# Patient Record
Sex: Male | Born: 1955
Health system: Southern US, Community
[De-identification: ages and names within clinical notes are randomized; demographics above are authoritative.]

## PROBLEM LIST (undated history)

## (undated) DIAGNOSIS — E785 Hyperlipidemia, unspecified: Secondary | ICD-10-CM

## (undated) DIAGNOSIS — C801 Malignant (primary) neoplasm, unspecified: Secondary | ICD-10-CM

## (undated) DIAGNOSIS — H3320 Serous retinal detachment, unspecified eye: Secondary | ICD-10-CM

## (undated) DIAGNOSIS — I10 Essential (primary) hypertension: Secondary | ICD-10-CM

## (undated) DIAGNOSIS — K219 Gastro-esophageal reflux disease without esophagitis: Secondary | ICD-10-CM

## (undated) DIAGNOSIS — Z860100 Personal history of colon polyps, unspecified: Secondary | ICD-10-CM

## (undated) DIAGNOSIS — R55 Syncope and collapse: Secondary | ICD-10-CM

## (undated) DIAGNOSIS — H33311 Horseshoe tear of retina without detachment, right eye: Secondary | ICD-10-CM

## (undated) DIAGNOSIS — H269 Unspecified cataract: Secondary | ICD-10-CM

## (undated) DIAGNOSIS — S82899A Other fracture of unspecified lower leg, initial encounter for closed fracture: Secondary | ICD-10-CM

## (undated) DIAGNOSIS — N200 Calculus of kidney: Secondary | ICD-10-CM

## (undated) DIAGNOSIS — S82009A Unspecified fracture of unspecified patella, initial encounter for closed fracture: Secondary | ICD-10-CM

## (undated) HISTORY — PX: EYE SURGERY: SHX253

## (undated) HISTORY — DX: Other fracture of unspecified lower leg, initial encounter for closed fracture: S82.899A

## (undated) HISTORY — PX: ORCHIECTOMY: SHX2116

## (undated) HISTORY — PX: CATARACT EXTRACTION: SUR2

## (undated) HISTORY — DX: Serous retinal detachment, unspecified eye: H33.20

## (undated) HISTORY — DX: Malignant (primary) neoplasm, unspecified: C80.1

## (undated) HISTORY — DX: Essential (primary) hypertension: I10

## (undated) HISTORY — PX: HYDROCELE EXCISION: SHX482

## (undated) HISTORY — PX: TONSILLECTOMY: SUR1361

## (undated) HISTORY — DX: Syncope and collapse: R55

## (undated) HISTORY — PX: KNEE ARTHROSCOPY: SUR90

## (undated) HISTORY — DX: Calculus of kidney: N20.0

## (undated) HISTORY — DX: Personal history of colonic polyps: Z86.010

## (undated) HISTORY — DX: Unspecified fracture of unspecified patella, initial encounter for closed fracture: S82.009A

## (undated) HISTORY — PX: WISDOM TOOTH EXTRACTION: SHX21

## (undated) HISTORY — DX: Hyperlipidemia, unspecified: E78.5

## (undated) HISTORY — DX: Unspecified cataract: H26.9

## (undated) HISTORY — DX: Personal history of colon polyps, unspecified: Z86.0100

---
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
## 2005-11-07 ENCOUNTER — Encounter: Admission: RE | Admit: 2005-11-07 | Discharge: 2005-11-07 | Payer: Self-pay | Admitting: Family Medicine

## 2005-11-07 ENCOUNTER — Ambulatory Visit: Payer: Self-pay | Admitting: Family Medicine

## 2005-11-08 ENCOUNTER — Ambulatory Visit: Payer: Self-pay | Admitting: Family Medicine

## 2006-04-10 DIAGNOSIS — R55 Syncope and collapse: Secondary | ICD-10-CM

## 2006-04-10 HISTORY — DX: Syncope and collapse: R55

## 2006-07-03 ENCOUNTER — Ambulatory Visit: Payer: Self-pay | Admitting: Family Medicine

## 2007-09-26 ENCOUNTER — Encounter: Payer: Self-pay | Admitting: Family Medicine

## 2007-10-01 ENCOUNTER — Ambulatory Visit: Payer: Self-pay | Admitting: Family Medicine

## 2007-10-01 DIAGNOSIS — I10 Essential (primary) hypertension: Secondary | ICD-10-CM

## 2007-10-01 DIAGNOSIS — Z8547 Personal history of malignant neoplasm of testis: Secondary | ICD-10-CM

## 2007-10-01 DIAGNOSIS — Z860100 Personal history of colon polyps, unspecified: Secondary | ICD-10-CM | POA: Insufficient documentation

## 2007-10-01 DIAGNOSIS — E785 Hyperlipidemia, unspecified: Secondary | ICD-10-CM | POA: Insufficient documentation

## 2007-10-01 DIAGNOSIS — Z87442 Personal history of urinary calculi: Secondary | ICD-10-CM

## 2007-10-09 ENCOUNTER — Encounter: Payer: Self-pay | Admitting: Family Medicine

## 2007-11-06 ENCOUNTER — Ambulatory Visit: Payer: Self-pay | Admitting: Family Medicine

## 2007-11-06 LAB — CONVERTED CEMR LAB
Ketones, urine, test strip: NEGATIVE
Specific Gravity, Urine: >=1.03
Urobilinogen, UA: 0.2
WBC Urine, dipstick: NEGATIVE

## 2007-11-07 LAB — CONVERTED CEMR LAB
Albumin: 4.4 g/dL (ref 3.5–5.2)
Alkaline Phosphatase: 60 units/L (ref 39–117)
BUN: 24 mg/dL — ABNORMAL HIGH (ref 6–23)
Basophils Absolute: 0 10*3/uL (ref 0.0–0.1)
Bilirubin, Direct: 0.1 mg/dL (ref 0.0–0.3)
Calcium: 9.5 mg/dL (ref 8.4–10.5)
Chloride: 103 meq/L (ref 96–112)
Cholesterol: 180 mg/dL (ref 0–200)
Direct LDL: 65.9 mg/dL
GFR calc non Af Amer: 74 mL/min
HDL: 28.8 mg/dL — ABNORMAL LOW (ref >39.0)
MCHC: 35.2 g/dL (ref 30.0–36.0)
Monocytes Absolute: 0.4 10*3/uL (ref 0.1–1.0)
Monocytes Relative: 8.4 % (ref 3.0–12.0)
Neutro Abs: 3.5 10*3/uL (ref 1.4–7.7)
PSA: 0.65 ng/mL (ref 0.10–4.00)
Platelets: 167 10*3/uL (ref 150–400)
RBC: 5.57 M/uL (ref 4.22–5.81)
Sodium: 144 meq/L (ref 135–145)
Total Bilirubin: 1.2 mg/dL (ref 0.3–1.2)
Total CHOL/HDL Ratio: 6.3
WBC: 5.1 10*3/uL (ref 4.5–10.5)

## 2007-11-20 ENCOUNTER — Ambulatory Visit: Payer: Self-pay | Admitting: Family Medicine

## 2007-12-02 ENCOUNTER — Telehealth: Payer: Self-pay | Admitting: Family Medicine

## 2007-12-03 ENCOUNTER — Ambulatory Visit: Payer: Self-pay | Admitting: Cardiology

## 2007-12-03 ENCOUNTER — Ambulatory Visit: Payer: Self-pay | Admitting: Family Medicine

## 2007-12-03 DIAGNOSIS — R319 Hematuria, unspecified: Secondary | ICD-10-CM

## 2007-12-03 LAB — CONVERTED CEMR LAB
Bilirubin Urine: NEGATIVE
Nitrite: NEGATIVE
Urobilinogen, UA: 0.2
pH: 5

## 2007-12-04 ENCOUNTER — Encounter: Payer: Self-pay | Admitting: Family Medicine

## 2008-06-01 ENCOUNTER — Ambulatory Visit: Payer: Self-pay | Admitting: Family Medicine

## 2008-06-01 DIAGNOSIS — L919 Hypertrophic disorder of the skin, unspecified: Secondary | ICD-10-CM

## 2008-06-01 DIAGNOSIS — IMO0002 Reserved for concepts with insufficient information to code with codable children: Secondary | ICD-10-CM | POA: Insufficient documentation

## 2008-06-01 DIAGNOSIS — L909 Atrophic disorder of skin, unspecified: Secondary | ICD-10-CM | POA: Insufficient documentation

## 2009-09-07 ENCOUNTER — Telehealth: Payer: Self-pay | Admitting: Family Medicine

## 2009-09-15 ENCOUNTER — Ambulatory Visit: Payer: Self-pay | Admitting: Family Medicine

## 2009-09-22 ENCOUNTER — Ambulatory Visit: Payer: Self-pay | Admitting: Family Medicine

## 2009-09-23 LAB — CONVERTED CEMR LAB
ALT: 21 units/L (ref 0–53)
AST: 21 units/L (ref 0–37)
Alkaline Phosphatase: 74 units/L (ref 39–117)
BUN: 21 mg/dL (ref 6–23)
Basophils Absolute: 0 10*3/uL (ref 0.0–0.1)
Bilirubin, Direct: 0.2 mg/dL (ref 0.0–0.3)
Calcium: 9.3 mg/dL (ref 8.4–10.5)
Creatinine, Ser: 0.9 mg/dL (ref 0.4–1.5)
Eosinophils Absolute: 0.1 10*3/uL (ref 0.0–0.7)
Eosinophils Relative: 1.9 % (ref 0.0–5.0)
GFR calc non Af Amer: 88.51 mL/min (ref >60)
Glucose, Bld: 86 mg/dL (ref 70–99)
HCT: 48.7 % (ref 39.0–52.0)
Lymphocytes Relative: 21.3 % (ref 12.0–46.0)
MCV: 84.7 fL (ref 78.0–100.0)
Neutro Abs: 4 10*3/uL (ref 1.4–7.7)
Platelets: 172 10*3/uL (ref 150.0–400.0)
Potassium: 4 meq/L (ref 3.5–5.1)
Total Bilirubin: 1 mg/dL (ref 0.3–1.2)
Triglycerides: 416 mg/dL — ABNORMAL HIGH (ref 0.0–149.0)
WBC: 5.8 10*3/uL (ref 4.5–10.5)

## 2009-09-28 LAB — CONVERTED CEMR LAB
Bilirubin Urine: NEGATIVE
Glucose, Urine, Semiquant: NEGATIVE
Specific Gravity, Urine: >=1.03
pH: 5

## 2009-09-30 ENCOUNTER — Ambulatory Visit: Payer: Self-pay | Admitting: Family Medicine

## 2009-09-30 LAB — CONVERTED CEMR LAB
Specific Gravity, Urine: 1.02
WBC Urine, dipstick: NEGATIVE

## 2009-11-26 ENCOUNTER — Encounter (INDEPENDENT_AMBULATORY_CARE_PROVIDER_SITE_OTHER): Payer: Self-pay | Admitting: *Deleted

## 2010-05-10 NOTE — Assessment & Plan Note (Signed)
Summary: CPX // RS   Vital Signs:  Patient profile:   55 year old male Weight:      253 pounds BMI:     35.41 BP sitting:   170 / 108  (left arm) Cuff size:   large  Vitals Entered By: Raechel Ache, RN (September 22, 2009 1:45 PM) CC: CPX, labs done. C/o bulge in abd, LBP in morning. C/o chest pains at noc, seems positional.   History of Present Illness: 55 yr old male for a cpx. He feels fine but realizes he has gained a lot of weight in the past year. After injuring his right leg last fall playing tennis, he was unable to exercise much at all for a time.   Allergies (verified): No Known Drug Allergies  Past History:  Past Medical History: Colonic polyps, hx of Hyperlipidemia Hypertension Nephrolithiasis, hx of Hx of testicular cancer 1974, had surgery and radiation treatments (no chemo) syncopal episode 11-2006, hospitalized in [place], [place], probably vasovagal in origin fell playing tennis 02-2009, had fractures to right ankle and right patella, also torn cartilage right knee  Past Surgical History: right knee arthroscopy  1995 Hydrocele  1992 right orchiectomy 1974 colonoscopy 2004, clear but he had had some polyps in the past  repeat right knee arthroscopy 02-2009 to repair torn cartilage, also to drill holes in the patella for a fracture  Family History: Reviewed history from 10/01/2007 and no changes required. Family History of CAD Male 1st degree relative <60 Family History Hypertension Family History of Sudden Death  Social History: Reviewed history from 10/01/2007 and no changes required. Married Never Smoked Alcohol use-yes Drug use-no Regular exercise-yes Occupation: runs a Engineer, drilling  Review of Systems  The patient denies anorexia, fever, weight loss, weight gain, vision loss, decreased hearing, hoarseness, chest pain, syncope, dyspnea on exertion, peripheral edema, prolonged cough, headaches, hemoptysis, abdominal pain, melena, hematochezia,  severe indigestion/heartburn, hematuria, incontinence, genital sores, muscle weakness, suspicious skin lesions, transient blindness, difficulty walking, depression, unusual weight change, abnormal bleeding, enlarged lymph nodes, angioedema, breast masses, and testicular masses.    Physical Exam  General:  overweight-appearing.   Head:  Normocephalic and atraumatic without obvious abnormalities. No apparent alopecia or balding. Eyes:  No corneal or conjunctival inflammation noted. EOMI. Perrla. Funduscopic exam benign, without hemorrhages, exudates or papilledema. Vision grossly normal. Ears:  External ear exam shows no significant lesions or deformities.  Otoscopic examination reveals clear canals, tympanic membranes are intact bilaterally without bulging, retraction, inflammation or discharge. Hearing is grossly normal bilaterally. Nose:  External nasal examination shows no deformity or inflammation. Nasal mucosa are pink and moist without lesions or exudates. Mouth:  Oral mucosa and oropharynx without lesions or exudates.  Teeth in good repair. Neck:  No deformities, masses, or tenderness noted. Chest Wall:  No deformities, masses, tenderness or gynecomastia noted. Lungs:  Normal respiratory effort, chest expands symmetrically. Lungs are clear to auscultation, no crackles or wheezes. Heart:  Normal rate and regular rhythm. S1 and S2 normal without gallop, murmur, click, rub or other extra sounds. EKG normal. Abdomen:  Bowel sounds positive,abdomen soft and non-tender without masses, organomegaly or hernias noted. He has a nontender diastasis recti  Rectal:  No external abnormalities noted. Normal sphincter tone. No rectal masses or tenderness. Heme neg.  Genitalia:  normal except right testicle is absent Prostate:  Prostate gland firm and smooth, no enlargement, nodularity, tenderness, mass, asymmetry or induration. Msk:  No deformity or scoliosis noted of thoracic or lumbar spine.  Pulses:  R  and L carotid,radial,femoral,dorsalis pedis and posterior tibial pulses are full and equal bilaterally Extremities:  No clubbing, cyanosis, edema, or deformity noted with normal full range of motion of all joints.   Neurologic:  No cranial nerve deficits noted. Station and gait are normal. Plantar reflexes are down-going bilaterally. DTRs are symmetrical throughout. Sensory, motor and coordinative functions appear intact. Skin:  Intact without suspicious lesions or rashes Cervical Nodes:  No lymphadenopathy noted Axillary Nodes:  No palpable lymphadenopathy Inguinal Nodes:  No significant adenopathy Psych:  Cognition and judgment appear intact. Alert and cooperative with normal attention span and concentration. No apparent delusions, illusions, hallucinations   Impression & Recommendations:  Problem # 1:  WELL ADULT EXAM (ICD-V70.0)  Orders: Hemoccult Guaiac-1 spec.(in office) (82270) EKG w/ Interpretation (93000)  Complete Medication List: 1)  Lisinopril-hydrochlorothiazide 20-12.5 Mg Tabs (Lisinopril-hydrochlorothiazide) .Marland Kitchen.. 1 tablet by mouth daily 2)  Fenofibrate 160 Mg Tabs (Fenofibrate) .... Once daily 3)  Fish Oil 1200 Mg Caps (Omega-3 fatty acids) .... Once daily  Other Orders: Gastroenterology Referral (GI)  Patient Instructions: 1)  It is important that you exercise reguarly at least 20 minutes 5 times a week. If you develop chest pain, have severe difficulty breathing, or feel very tired, stop exercising immediately and seek medical attention.  2)  You need to lose weight. Consider a lower calorie diet and regular exercise.  3)  Get back on meds for HTN. 4)  Start on Fenofibrate for high TG and add fish oil.  5)  Please schedule a follow-up appointment in 3 months .  6)  Schedule a colonoscopy/ sigmoidoscopy to help detect colon cancer.  Prescriptions: FENOFIBRATE 160 MG TABS (FENOFIBRATE) once daily  #30 x 11   Entered and Authorized by:   [name] MD   Signed by:    [name] MD on 09/22/2009   Method used:   Electronically to        [facility] # [phone]* (retail)       [address]       [place], [place]  [address]       Ph: [phone]       Fax: [phone]   RxID:   [id number] LISINOPRIL-HYDROCHLOROTHIAZIDE 20-12.5 MG TABS (LISINOPRIL-HYDROCHLOROTHIAZIDE) 1 tablet by mouth daily  #30 x 11   Entered and Authorized by:   [name] MD   Signed by:   [name] MD on 09/22/2009   Method used:   Electronically to        [facility] # [id number]* (retail)       [address]       [place], [place]  [address]       Ph: [phone]       Fax: [phone]   RxID:   [id number]

## 2010-05-10 NOTE — Letter (Signed)
Summary: Referral - not able to see patient  [facility]  [address] [place], [place] [address]   Phone: [phone]  Fax: [phone]    November 26, 2009    Tera Mater. Clent Ridges, M.D. [address] [place], [place] [address]   Re:   [name] [name] DOB:  1956/03/12 MRN:   [id number]    Dear Dr. Clent Ridges:  Thank you for your kind referral of the above patient.  We have attempted to schedule the recommended procedure Screening Colonoscopy but have not been able to schedule because:   X  The patient was not available by phone and/or has not returned our calls.  ___ The patient declined to schedule the procedure at this time.  We appreciate the referral and hope that we will have the opportunity to treat this patient in the future.    Sincerely,    [facility] [phone]

## 2010-05-10 NOTE — Progress Notes (Signed)
Summary: chest pain  Phone Note Call from Patient   Summary of Call: Mild chest pain at night that comes & goes, goes away if he changes position, no radiation, nausea, perspiration.  Advised 98for worsening sypmtoms until appointment.   Initial call taken by: Rudy Jew, RN,  Sep 07, 2009 2:21 PM

## 2010-10-11 ENCOUNTER — Encounter: Payer: Self-pay | Admitting: Family Medicine

## 2010-10-14 ENCOUNTER — Encounter: Payer: Self-pay | Admitting: Family Medicine

## 2010-10-14 ENCOUNTER — Ambulatory Visit (INDEPENDENT_AMBULATORY_CARE_PROVIDER_SITE_OTHER): Payer: BC Managed Care – PPO | Admitting: Family Medicine

## 2010-10-14 VITALS — BP 130/80 | HR 74 | Temp 98.0°F | Wt 241.0 lb

## 2010-10-14 DIAGNOSIS — I10 Essential (primary) hypertension: Secondary | ICD-10-CM

## 2010-10-14 DIAGNOSIS — L989 Disorder of the skin and subcutaneous tissue, unspecified: Secondary | ICD-10-CM

## 2010-10-14 MED ORDER — FENOFIBRATE 160 MG PO TABS: 160.0000 mg | ORAL_TABLET | Freq: Every day | ORAL | Status: DC

## 2010-10-14 MED ORDER — LISINOPRIL-HYDROCHLOROTHIAZIDE 20-12.5 MG PO TABS: 1.0000 | ORAL_TABLET | Freq: Every day | ORAL | Status: DC

## 2010-10-14 NOTE — Progress Notes (Signed)
  Subjective:    Patient ID: [name] [name], male    DOB: Feb 26, 1956, 55 y.o.   MRN: [id number]  HPI Here to follow up on HTN and to ask about some skin leisons around his eyes. These have been growing for several years. He feels great, still playing tennis. He has started on a no carb diet and is losing weight.    Review of Systems  Constitutional: Negative.   Respiratory: Negative.   Cardiovascular: Negative.        Objective:   Physical Exam  Constitutional: He appears well-developed and well-nourished.  Neck: No thyromegaly present.  Cardiovascular: Normal rate, regular rhythm, normal heart sounds and intact distal pulses.   Pulmonary/Chest: Effort normal and breath sounds normal.  Lymphadenopathy:    He has no cervical adenopathy.  Skin:       Several skin tags on the eyelids           Assessment & Plan:  Refilled meds. Set up a cpx with labs soon. Refer to Dermatology.

## 2010-10-25 ENCOUNTER — Encounter: Payer: Self-pay | Admitting: Speech Pathology

## 2010-11-24 ENCOUNTER — Other Ambulatory Visit (INDEPENDENT_AMBULATORY_CARE_PROVIDER_SITE_OTHER): Payer: BC Managed Care – PPO

## 2010-11-24 ENCOUNTER — Other Ambulatory Visit: Payer: BC Managed Care – PPO | Admitting: Family Medicine

## 2010-11-24 DIAGNOSIS — Z0000 Encounter for general adult medical examination without abnormal findings: Secondary | ICD-10-CM

## 2010-11-24 LAB — LIPID PANEL
Cholesterol: 168 mg/dL (ref 0–200)
VLDL: 14.8 mg/dL (ref 0.0–40.0)

## 2010-11-24 LAB — POCT URINALYSIS DIPSTICK
Glucose, UA: NEGATIVE
Nitrite, UA: NEGATIVE
Spec Grav, UA: 1.025
Urobilinogen, UA: 0.2

## 2010-11-24 LAB — BASIC METABOLIC PANEL
Calcium: 9.1 mg/dL (ref 8.4–10.5)
Chloride: 105 mEq/L (ref 96–112)
Creatinine, Ser: 0.9 mg/dL (ref 0.4–1.5)
Potassium: 4.8 mEq/L (ref 3.5–5.1)
Sodium: 140 mEq/L (ref 135–145)

## 2010-11-24 LAB — CBC WITH DIFFERENTIAL/PLATELET
Basophils Absolute: 0 10*3/uL (ref 0.0–0.1)
Eosinophils Absolute: 0.1 10*3/uL (ref 0.0–0.7)
Eosinophils Relative: 2.4 % (ref 0.0–5.0)
Hemoglobin: 15.5 g/dL (ref 13.0–17.0)
MCHC: 33.8 g/dL (ref 30.0–36.0)
Monocytes Absolute: 0.4 10*3/uL (ref 0.1–1.0)
Neutro Abs: 3.3 10*3/uL (ref 1.4–7.7)
Platelets: 172 10*3/uL (ref 150.0–400.0)
RBC: 5.25 Mil/uL (ref 4.22–5.81)
RDW: 13.4 % (ref 11.5–14.6)
WBC: 5 10*3/uL (ref 4.5–10.5)

## 2010-11-24 LAB — TSH: TSH: 0.83 u[IU]/mL (ref 0.35–5.50)

## 2010-11-24 LAB — HEPATIC FUNCTION PANEL
ALT: 29 U/L (ref 0–53)
AST: 27 U/L (ref 0–37)
Alkaline Phosphatase: 46 U/L (ref 39–117)
Bilirubin, Direct: 0.2 mg/dL (ref 0.0–0.3)

## 2010-11-29 ENCOUNTER — Telehealth: Payer: Self-pay

## 2010-11-29 NOTE — Telephone Encounter (Signed)
Message copied by Beverely Low on Tue Nov 29, 2010  2:00 PM ------      Message from: Gershon Crane A      Created: Tue Nov 29, 2010  5:35 AM       Normal except mildly high chol. Watch the diet

## 2010-11-29 NOTE — Telephone Encounter (Signed)
Pt aware.

## 2010-12-01 ENCOUNTER — Encounter: Payer: Self-pay | Admitting: Family Medicine

## 2010-12-01 ENCOUNTER — Ambulatory Visit (INDEPENDENT_AMBULATORY_CARE_PROVIDER_SITE_OTHER): Payer: BC Managed Care – PPO | Admitting: Family Medicine

## 2010-12-01 VITALS — BP 126/90 | HR 100 | Temp 98.1°F | Ht 70.75 in | Wt 232.0 lb

## 2010-12-01 DIAGNOSIS — Z0000 Encounter for general adult medical examination without abnormal findings: Secondary | ICD-10-CM

## 2010-12-01 NOTE — Progress Notes (Signed)
  Subjective:    Patient ID: [name] [name], male    DOB: December 02, 1955, 55 y.o.   MRN: [id number]  HPI 55 yr old male for a cpx. He feels great and has no concerns. He has gone on a strict no carbohydrate diet for the past 2 months, and he has lost 30 lbs. He feels better and has more energy. He stopped taking all his meds about a month ago.    Review of Systems  Constitutional: Negative.   HENT: Negative.   Eyes: Negative.   Respiratory: Negative.   Cardiovascular: Negative.   Gastrointestinal: Negative.   Genitourinary: Negative.   Musculoskeletal: Negative.   Skin: Negative.   Neurological: Negative.   Hematological: Negative.   Psychiatric/Behavioral: Negative.        Objective:   Physical Exam  Constitutional: He is oriented to person, place, and time. He appears well-developed and well-nourished. No distress.  HENT:  Head: Normocephalic and atraumatic.  Right Ear: External ear normal.  Left Ear: External ear normal.  Nose: Nose normal.  Mouth/Throat: Oropharynx is clear and moist. No oropharyngeal exudate.  Eyes: Conjunctivae and EOM are normal. Pupils are equal, round, and reactive to light. Right eye exhibits no discharge. Left eye exhibits no discharge. No scleral icterus.  Neck: Neck supple. No JVD present. No tracheal deviation present. No thyromegaly present.  Cardiovascular: Normal rate, regular rhythm, normal heart sounds and intact distal pulses.  Exam reveals no gallop and no friction rub.   No murmur heard.      EKG normal   Pulmonary/Chest: Effort normal and breath sounds normal. No respiratory distress. He has no wheezes. He has no rales. He exhibits no tenderness.  Abdominal: Soft. Bowel sounds are normal. He exhibits no distension and no mass. There is no tenderness. There is no rebound and no guarding.  Genitourinary: Rectum normal, prostate normal and penis normal. Guaiac negative stool. Left testis shows no mass, no swelling and no tenderness. No penile  tenderness.       Right testicle surgically absent   Musculoskeletal: Normal range of motion. He exhibits no edema and no tenderness.  Lymphadenopathy:    He has no cervical adenopathy.  Neurological: He is alert and oriented to person, place, and time. He has normal reflexes. No cranial nerve deficit. He exhibits normal muscle tone. Coordination normal.  Skin: Skin is warm and dry. No rash noted. He is not diaphoretic. No erythema. No pallor.  Psychiatric: He has a normal mood and affect. His behavior is normal. Judgment and thought content normal.          Assessment & Plan:  Continue current diet and exercise. He may stay off Fenofibrate and the BP med. I did ask him to restart the fish oil capsules daily.

## 2011-02-08 ENCOUNTER — Ambulatory Visit (INDEPENDENT_AMBULATORY_CARE_PROVIDER_SITE_OTHER): Payer: BC Managed Care – PPO | Admitting: Family Medicine

## 2011-02-08 ENCOUNTER — Encounter: Payer: Self-pay | Admitting: Family Medicine

## 2011-02-08 VITALS — BP 140/90 | HR 99 | Temp 98.5°F | Wt 232.0 lb

## 2011-02-08 DIAGNOSIS — H6980 Other specified disorders of Eustachian tube, unspecified ear: Secondary | ICD-10-CM

## 2011-02-08 DIAGNOSIS — H6990 Unspecified Eustachian tube disorder, unspecified ear: Secondary | ICD-10-CM

## 2011-02-09 ENCOUNTER — Encounter: Payer: Self-pay | Admitting: Family Medicine

## 2011-02-09 NOTE — Progress Notes (Signed)
  Subjective:    Patient ID: [name] [name], male    DOB: 1955-08-06, 55 y.o.   MRN: [id number]  HPI Here asking about pressure and mild pain in both ears for several days. He has had some sinus congestion but no HA or ST or cough.    Review of Systems  Constitutional: Negative.   HENT: Positive for hearing loss and ear pain. Negative for congestion, postnasal drip, sinus pressure and tinnitus.   Eyes: Negative.   Respiratory: Negative.        Objective:   Physical Exam  Constitutional: He appears well-developed and well-nourished.  HENT:  Right Ear: External ear normal.  Left Ear: External ear normal.  Nose: Nose normal.  Mouth/Throat: Oropharynx is clear and moist. No oropharyngeal exudate.  Eyes: Conjunctivae are normal. Pupils are equal, round, and reactive to light.  Neck: Neck supple. No thyromegaly present.  Pulmonary/Chest: Effort normal and breath sounds normal.  Lymphadenopathy:    He has no cervical adenopathy.          Assessment & Plan:  Try Claritin D bid and use Afrin sprays prn

## 2012-09-24 ENCOUNTER — Other Ambulatory Visit: Payer: BC Managed Care – PPO

## 2012-09-27 ENCOUNTER — Other Ambulatory Visit (INDEPENDENT_AMBULATORY_CARE_PROVIDER_SITE_OTHER): Payer: BC Managed Care – PPO

## 2012-09-27 DIAGNOSIS — Z0000 Encounter for general adult medical examination without abnormal findings: Secondary | ICD-10-CM

## 2012-09-27 LAB — POCT URINALYSIS DIPSTICK
Glucose, UA: NEGATIVE
Ketones, UA: NEGATIVE
Leukocytes, UA: NEGATIVE
Urobilinogen, UA: 0.2

## 2012-09-27 LAB — HEPATIC FUNCTION PANEL
ALT: 22 U/L (ref 0–53)
AST: 18 U/L (ref 0–37)
Alkaline Phosphatase: 65 U/L (ref 39–117)
Bilirubin, Direct: 0.2 mg/dL (ref 0.0–0.3)
Total Protein: 7.1 g/dL (ref 6.0–8.3)

## 2012-09-27 LAB — LDL CHOLESTEROL, DIRECT: Direct LDL: 86.8 mg/dL

## 2012-09-27 LAB — CBC WITH DIFFERENTIAL/PLATELET
Basophils Absolute: 0 10*3/uL (ref 0.0–0.1)
Basophils Relative: 0.4 % (ref 0.0–3.0)
Eosinophils Absolute: 0.1 10*3/uL (ref 0.0–0.7)
Eosinophils Relative: 2.2 % (ref 0.0–5.0)
MCHC: 34.6 g/dL (ref 30.0–36.0)
MCV: 85.4 fl (ref 78.0–100.0)
Monocytes Absolute: 0.5 10*3/uL (ref 0.1–1.0)
Neutro Abs: 4.1 10*3/uL (ref 1.4–7.7)
Neutrophils Relative %: 67.9 % (ref 43.0–77.0)
RBC: 5.9 Mil/uL — ABNORMAL HIGH (ref 4.22–5.81)

## 2012-09-27 LAB — BASIC METABOLIC PANEL
BUN: 19 mg/dL (ref 6–23)
CO2: 25 mEq/L (ref 19–32)
Chloride: 105 mEq/L (ref 96–112)
GFR: 83.44 mL/min (ref >60.00)
Glucose, Bld: 94 mg/dL (ref 70–99)

## 2012-09-27 LAB — LIPID PANEL: Total CHOL/HDL Ratio: 5

## 2012-10-01 ENCOUNTER — Encounter: Payer: Self-pay | Admitting: Family Medicine

## 2012-10-01 ENCOUNTER — Ambulatory Visit (INDEPENDENT_AMBULATORY_CARE_PROVIDER_SITE_OTHER): Payer: BC Managed Care – PPO | Admitting: Family Medicine

## 2012-10-01 VITALS — BP 166/100 | HR 95 | Temp 98.4°F | Ht 70.5 in | Wt 246.0 lb

## 2012-10-01 DIAGNOSIS — Z0000 Encounter for general adult medical examination without abnormal findings: Secondary | ICD-10-CM

## 2012-10-01 DIAGNOSIS — G4733 Obstructive sleep apnea (adult) (pediatric): Secondary | ICD-10-CM

## 2012-10-01 MED ORDER — LISINOPRIL-HYDROCHLOROTHIAZIDE 20-12.5 MG PO TABS: 1.0000 | ORAL_TABLET | Freq: Every day | ORAL | Status: DC

## 2012-10-01 NOTE — Progress Notes (Signed)
Quick Note:  Pt is here now in office for CPE. ______

## 2012-10-01 NOTE — Progress Notes (Signed)
  Subjective:    Patient ID: [name] [name], male    DOB: 10-19-1955, 57 y.o.   MRN: [id number]  HPI 57 yr old male for a cpx,. He has put on some weight in the past year, and he admits to not exercising much. He does not check his BP. He feels tired all the time and he asks to check his testosterone level. His libido is down somewhat but he denies any erection problems. He says his wife tells him that he snores a lot, that he is a restless sleeper, and that he stops breathing frequently during the night.    Review of Systems  Constitutional: Positive for activity change, fatigue and unexpected weight change. Negative for fever, chills, diaphoresis and appetite change.  HENT: Negative.   Eyes: Negative.   Respiratory: Negative.   Cardiovascular: Negative.   Gastrointestinal: Negative.   Genitourinary: Negative.   Musculoskeletal: Negative.   Skin: Negative.   Neurological: Negative.   Psychiatric/Behavioral: Negative.        Objective:   Physical Exam  Constitutional: He is oriented to person, place, and time. He appears well-developed and well-nourished. No distress.  He has gained 16 lbs   HENT:  Head: Normocephalic and atraumatic.  Right Ear: External ear normal.  Left Ear: External ear normal.  Nose: Nose normal.  Mouth/Throat: Oropharynx is clear and moist. No oropharyngeal exudate.  Eyes: Conjunctivae and EOM are normal. Pupils are equal, round, and reactive to light. Right eye exhibits no discharge. Left eye exhibits no discharge. No scleral icterus.  Neck: Neck supple. No JVD present. No tracheal deviation present. No thyromegaly present.  Cardiovascular: Normal rate, regular rhythm, normal heart sounds and intact distal pulses.  Exam reveals no gallop and no friction rub.   No murmur heard. EKG normal   Pulmonary/Chest: Effort normal and breath sounds normal. No respiratory distress. He has no wheezes. He has no rales. He exhibits no tenderness.  Abdominal: Soft. Bowel  sounds are normal. He exhibits no distension and no mass. There is no tenderness. There is no rebound and no guarding.  Genitourinary: Rectum normal, prostate normal and penis normal. Guaiac negative stool. No penile tenderness.  Musculoskeletal: Normal range of motion. He exhibits no edema and no tenderness.  Lymphadenopathy:    He has no cervical adenopathy.  Neurological: He is alert and oriented to person, place, and time. He has normal reflexes. No cranial nerve deficit. He exhibits normal muscle tone. Coordination normal.  Skin: Skin is warm and dry. No rash noted. He is not diaphoretic. No erythema. No pallor.  Psychiatric: He has a normal mood and affect. His behavior is normal. Judgment and thought content normal.          Assessment & Plan:  Well exam. We will start him back on Zestoretic. He needs to exercise and lose weight. He likely has sleep apnea so we will refer to work this up. Set up a colonoscopy. Check a testosterone level.

## 2012-10-03 ENCOUNTER — Encounter: Payer: Self-pay | Admitting: Family Medicine

## 2012-10-03 MED ORDER — TESTOSTERONE 20.25 MG/1.25GM (1.62%) TD GEL: 4.0000 "application " | Freq: Every day | TRANSDERMAL | Status: DC

## 2012-10-03 NOTE — Addendum Note (Signed)
Addended by: Aniceto Boss A on: 10/03/2012 12:34 PM   Modules accepted: Orders

## 2012-10-03 NOTE — Addendum Note (Signed)
Addended by: Aniceto Boss A on: 10/03/2012 12:37 PM   Modules accepted: Orders

## 2012-10-03 NOTE — Progress Notes (Signed)
Quick Note:  I spoke with pt and called in script ______

## 2012-10-04 ENCOUNTER — Encounter: Payer: Self-pay | Admitting: Gastroenterology

## 2012-10-09 ENCOUNTER — Encounter: Payer: Self-pay | Admitting: Gastroenterology

## 2012-10-09 ENCOUNTER — Ambulatory Visit (AMBULATORY_SURGERY_CENTER): Payer: BC Managed Care – PPO | Admitting: *Deleted

## 2012-10-09 VITALS — Ht 71.0 in | Wt 247.0 lb

## 2012-10-09 DIAGNOSIS — Z1211 Encounter for screening for malignant neoplasm of colon: Secondary | ICD-10-CM

## 2012-10-09 MED ORDER — MOVIPREP 100 G PO SOLR: ORAL | Status: DC

## 2012-10-21 ENCOUNTER — Encounter: Payer: Self-pay | Admitting: Gastroenterology

## 2012-10-21 ENCOUNTER — Ambulatory Visit (AMBULATORY_SURGERY_CENTER): Payer: BC Managed Care – PPO | Admitting: Gastroenterology

## 2012-10-21 VITALS — BP 149/87 | HR 64 | Temp 96.8°F | Resp 18 | Ht 71.0 in | Wt 247.0 lb

## 2012-10-21 DIAGNOSIS — Z1211 Encounter for screening for malignant neoplasm of colon: Secondary | ICD-10-CM

## 2012-10-21 DIAGNOSIS — D175 Benign lipomatous neoplasm of intra-abdominal organs: Secondary | ICD-10-CM

## 2012-10-21 DIAGNOSIS — K6389 Other specified diseases of intestine: Secondary | ICD-10-CM

## 2012-10-21 DIAGNOSIS — Z860100 Personal history of colon polyps, unspecified: Secondary | ICD-10-CM

## 2012-10-21 DIAGNOSIS — K649 Unspecified hemorrhoids: Secondary | ICD-10-CM

## 2012-10-21 DIAGNOSIS — D126 Benign neoplasm of colon, unspecified: Secondary | ICD-10-CM

## 2012-10-21 DIAGNOSIS — K5730 Diverticulosis of large intestine without perforation or abscess without bleeding: Secondary | ICD-10-CM

## 2012-10-21 HISTORY — PX: COLONOSCOPY: SHX174

## 2012-10-21 MED ORDER — SODIUM CHLORIDE 0.9 % IV SOLN: 500.0000 mL | INTRAVENOUS | Status: DC

## 2012-10-21 NOTE — Progress Notes (Signed)
Doctor and CRNA made aware of  Elevated blood pressure. 161/104 lt arm and 150 /103 rt. Arm. No new orders received.

## 2012-10-21 NOTE — Op Note (Signed)
[facility] [address] [place], [address]   COLONOSCOPY PROCEDURE REPORT  PATIENT: [name] [name], [name] [name]  MR#: [id number] BIRTHDATE: Aug 21, 1955 , 57  yrs. old GENDER: Male ENDOSCOPIST: Mardella Layman, MD, [facility] REFERRED BY:  Gershon Crane, M.D. PROCEDURE DATE:  10/21/2012 PROCEDURE:   Colonoscopy with snare polypectomy ASA CLASS:   Class II INDICATIONS:average risk screening. MEDICATIONS: propofol (Diprivan) 400mg  IV  DESCRIPTION OF PROCEDURE:   After the risks and benefits and of the procedure were explained, informed consent was obtained.  A digital rectal exam revealed hemorrhoids.    The [id number] endoscope was introduced through the anus and advanced to the cecum, which was identified by both the appendix and ileocecal valve .  The quality of the prep was adequate. .  The instrument was then slowly withdrawn as the colon was fully examined.     COLON FINDINGS: This was an extremely difficult colonoscopy because of a very redundant and large colon with severe left colon diverticulosis.  Patient also has large bulging mixed internal and external hemorrhoids.  The prep throughout the colon was less than adequate.  In the cecum there were nodules around the appendiceal area that were biopsied and labeled specimen #1.  There were polyps in the descending and transverse colon which measured from 2-7 millimeters in size and were removed with electrocautery hot snare and labeled specimen jar #2.  In the sigmoid colon and rectum there were multiple small 3-7 mm polyps and also were excised with electrocautery snare and labeled specimen #3.  There were swollen and edematous folds in the sigmoid colon from the severe diverticulosis.  There also was a prominent lipoma noted in the ascending colon and another lipoma in the sigmoid-descending colon. Retroflexed views revealed internal/external hemorrhoids. The scope was then withdrawn from the patient and the  procedure completed.  COMPLICATIONS: There were no complications. ENDOSCOPIC IMPRESSION: This was an extremely difficult colonoscopy because of a very redundant and large colon with severe left colon diverticulosis. Patient also has large bulging mixed internal and external hemorrhoids.   The prep throughout the colon was less than adequate.  In the cecum there were nodules around the appendiceal area that were biopsied and labeled specimen #1.  There were polyps in the descending and transverse colon which measured from 2-7 millimeters in size and were removed with electrocautery hot snare and labeled specimen jar #2.  In the sigmoid colon and rectum there were multiple small 3-7 mm polyps and also were excised with electrocautery snare and labeled specimen #3.  There were swollen and edematous folds in the sigmoid colon from the severe diverticulosis.  There also was a prominent lipoma noted in the ascending colon and another lipoma in the sigmoid-descending colon .  This patient is at high risk for colon cancer because of his multiple colon polyps in his history of having had a polypectomy at age 2.  If any of these polyps are adenomas, will need genetic analysis and also followup colonoscopy in one to 2 years.  RECOMMENDATIONS: 1.  Await biopsy results 2.  High fiber diet with liberal fluid intake. 3.  Metamucil or benefiber   REPEAT EXAM:  cc:  _______________________________ eSignedMardella Layman, MD, [facility] 10/21/2012 11:50 AM     PATIENT NAME:  [name] [name] MR#: [id number]

## 2012-10-21 NOTE — Progress Notes (Signed)
Patient did not experience any of the following events: a burn prior to discharge; a fall within the facility; wrong site/side/patient/procedure/implant event; or a hospital transfer or hospital admission upon discharge from the facility. (G8907) Patient did not have preoperative order for IV antibiotic SSI prophylaxis. (G8918)  

## 2012-10-21 NOTE — Patient Instructions (Addendum)
Impressions/recommendations:  Polyps (handout given) Diverticulosis (handout given) Hemorrhoids (handout given) High fiber diet (handout given)  Repeat colonoscopy pending pathology results. Genetic counseling pending pathology results.  YOU HAD AN ENDOSCOPIC PROCEDURE TODAY AT THE [facility]: Refer to the procedure report that was given to you for any specific questions about what was found during the examination.  If the procedure report does not answer your questions, please call your gastroenterologist to clarify.  If you requested that your care partner not be given the details of your procedure findings, then the procedure report has been included in a sealed envelope for you to review at your convenience later.  YOU SHOULD EXPECT: Some feelings of bloating in the abdomen. Passage of more gas than usual.  Walking can help get rid of the air that was put into your GI tract during the procedure and reduce the bloating. If you had a lower endoscopy (such as a colonoscopy or flexible sigmoidoscopy) you may notice spotting of blood in your stool or on the toilet paper. If you underwent a bowel prep for your procedure, then you may not have a normal bowel movement for a few days.  DIET: Your first meal following the procedure should be a light meal and then it is ok to progress to your normal diet.  A half-sandwich or bowl of soup is an example of a good first meal.  Heavy or fried foods are harder to digest and may make you feel nauseous or bloated.  Likewise meals heavy in dairy and vegetables can cause extra gas to form and this can also increase the bloating.  Drink plenty of fluids but you should avoid alcoholic beverages for 24 hours.  ACTIVITY: Your care partner should take you home directly after the procedure.  You should plan to take it easy, moving slowly for the rest of the day.  You can resume normal activity the day after the procedure however you should NOT DRIVE or use  heavy machinery for 24 hours (because of the sedation medicines used during the test).    SYMPTOMS TO REPORT IMMEDIATELY: A gastroenterologist can be reached at any hour.  During normal business hours, 8:30 AM to 5:00 PM Monday through Friday, call [phone].  After hours and on weekends, please call the GI answering service at [phone] who will take a message and have the physician on call contact you.   Following lower endoscopy (colonoscopy or flexible sigmoidoscopy):  Excessive amounts of blood in the stool  Significant tenderness or worsening of abdominal pains  Swelling of the abdomen that is new, acute  Fever of 100F or higher  FOLLOW UP: If any biopsies were taken you will be contacted by phone or by letter within the next 1-3 weeks.  Call your gastroenterologist if you have not heard about the biopsies in 3 weeks.  Our staff will call the home number listed on your records the next business day following your procedure to check on you and address any questions or concerns that you may have at that time regarding the information given to you following your procedure. This is a courtesy call and so if there is no answer at the home number and we have not heard from you through the emergency physician on call, we will assume that you have returned to your regular daily activities without incident.  SIGNATURES/CONFIDENTIALITY: You and/or your care partner have signed paperwork which will be entered into your electronic medical record.  These signatures  attest to the fact that that the information above on your After Visit Summary has been reviewed and is understood.  Full responsibility of the confidentiality of this discharge information lies with you and/or your care-partner.

## 2012-10-22 ENCOUNTER — Telehealth: Payer: Self-pay | Admitting: *Deleted

## 2012-10-22 NOTE — Telephone Encounter (Signed)
  Follow up Call-  Call back number 10/21/2012  Post procedure Call Back phone  # [phone]  Permission to leave phone message Yes     Patient questions:  Do you have a fever, pain , or abdominal swelling? no Pain Score  0 *  Have you tolerated food without any problems? yes  Have you been able to return to your normal activities? yes  Do you have any questions about your discharge instructions: Diet   no Medications  no Follow up visit  no  Do you have questions or concerns about your Care? no  Actions: * If pain score is 4 or above: No action needed, pain <4.

## 2012-10-30 ENCOUNTER — Telehealth: Payer: Self-pay | Admitting: Family Medicine

## 2012-10-30 ENCOUNTER — Ambulatory Visit (INDEPENDENT_AMBULATORY_CARE_PROVIDER_SITE_OTHER): Payer: BC Managed Care – PPO | Admitting: Pulmonary Disease

## 2012-10-30 ENCOUNTER — Encounter: Payer: Self-pay | Admitting: Pulmonary Disease

## 2012-10-30 VITALS — BP 172/108 | HR 87 | Temp 96.9°F | Ht 71.5 in | Wt 248.8 lb

## 2012-10-30 DIAGNOSIS — R0683 Snoring: Secondary | ICD-10-CM

## 2012-10-30 DIAGNOSIS — R0609 Other forms of dyspnea: Secondary | ICD-10-CM

## 2012-10-30 NOTE — Assessment & Plan Note (Signed)
The patient has a history of snoring, as well as witnessed abnormal breathing pattern by his spouse.  However, the patient feels that he sleeps well, is rested in the mornings, and denies any significant inappropriate daytime sleepiness.  It is unclear whether the patient may have symptomatic snoring, versus mild obstructive sleep apnea.  I suspect this does not represent a health risk for him, and therefore he needs to consider the impact to his and his wife's quality of life.  One approach would be to take some time and work on weight loss as well as positional therapy, but we could also consider sleep testing in order to put the issue to rest and also reassure his wife.  The patient will talk with her about this, and will let us know his decision.

## 2012-10-30 NOTE — Telephone Encounter (Signed)
Pt is on BP meds. However, BP is consistently around 160/100 to 170/105. lisinopril-hydrochlorothiazide (PRINZIDE,ZESTORETIC) 20-12.5 MG per tablet Please advise re different dosage/different med.

## 2012-10-30 NOTE — Patient Instructions (Addendum)
Discuss with your wife the conservative path of weight loss and staying off your back while sleeping vs proceeding with home sleep testing.  Let me know if you decide to have the sleep study. If you decide to work on the conservative treatment, please call if you have increased symptoms/sleep disruption.

## 2012-10-30 NOTE — Progress Notes (Signed)
Subjective:    Patient ID: [name] [name], male    DOB: [birth year], 57 y.o.   MRN: [id number]  HPI The patient is a 57 year old male who I've been asked to see for possible obstructive sleep apnea.  He has been noted to have loud snoring, as well as an abnormal breathing pattern during sleep his wife.  He feels that he sleeps very well at night and denies frequent awakenings.  He feels rested in the mornings upon arising, and denies any daytime sleepiness with inactivity.  However, he admits that his work is very active and doesn't provide opportunities to get sleepy.  He may have a short period of sleepiness after dinner where he may For 15 minutes, but denies any she is watching movies or television programs.  He denies any sleepiness with driving.  Patient states that his weight is stable over the last 2 years, and his Epworth score is normal at 3.   Sleep Questionnaire What time do you typically go to bed?( Between what hours) 8-9p 8-9p at 1333 on 10/30/12 by Nita Sells, CMA How long does it take you to fall asleep? 5-68mins 5-24mins at 1333 on 10/30/12 by Nita Sells, CMA How many times during the night do you wake up? 2 2 at 1333 on 10/30/12 by Nita Sells, CMA What time do you get out of bed to start your day? 0400 0400 at 1333 on 10/30/12 by Nita Sells, CMA Do you drive or operate heavy machinery in your occupation? No No at 1333 on 10/30/12 by Nita Sells, CMA How much has your weight changed (up or down) over the past two years? (In pounds) 0 oz (0 kg) 0 oz (0 kg) at 1333 on 10/30/12 by Nita Sells, CMA Have you ever had a sleep study before? No No at 1333 on 10/30/12 by Marjo Bicker Mabe, CMA Do you currently use CPAP? No No at 1333 on 10/30/12 by Marjo Bicker Mabe, CMA Do you wear oxygen at any time? No No at 1333 on 10/30/12 by Marjo Bicker Mabe, CMA   Review of Systems  Constitutional: Negative for fever and unexpected weight change.  HENT: Negative for ear  pain, nosebleeds, congestion, sore throat, rhinorrhea, sneezing, trouble swallowing, dental problem, postnasal drip and sinus pressure.   Eyes: Negative for redness and itching.  Respiratory: Negative for cough, chest tightness, shortness of breath and wheezing.   Cardiovascular: Negative for palpitations and leg swelling.  Gastrointestinal: Negative for nausea and vomiting.  Genitourinary: Negative for dysuria.  Musculoskeletal: Negative for joint swelling.  Skin: Negative for rash.  Neurological: Negative for headaches.  Hematological: Does not bruise/bleed easily.  Psychiatric/Behavioral: Negative for dysphoric mood. The patient is not nervous/anxious.        Objective:   Physical Exam Constitutional:  Overweight male, no acute distress  HENT:  Nares patent without discharge, but narrowed.  Oropharynx without exudate, palate and uvula thick and elongated.   Eyes:  Perrla, eomi, no scleral icterus  Neck:  No JVD, no TMG  Cardiovascular:  Normal rate, regular rhythm, no rubs or gallops.  No murmurs        Intact distal pulses  Pulmonary :  Normal breath sounds, no stridor or respiratory distress   No rales, rhonchi, or wheezing  Abdominal:  Soft, nondistended, bowel sounds present.  No tenderness noted.   Musculoskeletal:  No lower extremity edema noted.  Lymph Nodes:  No cervical lymphadenopathy noted  Skin:  No cyanosis  noted  Neurologic:  Alert, appropriate, moves all 4 extremities without obvious deficit.         Assessment & Plan:

## 2012-10-30 NOTE — Telephone Encounter (Signed)
Continue the lisinopril HCT but add Amlodipine 5 mg daily to it. Call in #30 with 11 rf, and see me in one month

## 2012-10-31 ENCOUNTER — Encounter: Payer: Self-pay | Admitting: Gastroenterology

## 2012-10-31 MED ORDER — AMLODIPINE BESYLATE 5 MG PO TABS: 5.0000 mg | ORAL_TABLET | Freq: Every day | ORAL | Status: DC

## 2012-10-31 NOTE — Telephone Encounter (Signed)
Sent script e-scribe and left a voice message with below information.

## 2013-02-18 ENCOUNTER — Telehealth: Payer: Self-pay | Admitting: Family Medicine

## 2013-02-18 NOTE — Telephone Encounter (Signed)
Patient Information:  Caller Name: [name] [name]  Phone: [phone]  Patient: [name], [name]  Gender: Male  DOB: Aug 22, 1955  Age: 57 Years  PCP: [name] [name] [facility])  Office Follow Up:  Does the office need to follow up with this patient?: Yes  Instructions For The Office: Please call back regarding a work in appointment.  RN Note:  Pain under left ribs rated 3-4/10 when takes deep breath.  Also asking about follow up lab test for testerone level.  No appointments remain for 02/18/13 with Dr [name] [name]; message sent to office staff to call [name] [name] regarding a work in appointment.   Symptoms  Reason For Call & Symptoms: Left sided chest discomfort after mowing lawn and raking yesterday.  Pain occurred only with movement or deep breath and lasted 1-2 hours yesterday.  Today, 02/18/13, only feels sharp pain with deep inspiration, "like a mini broken rib."  Reviewed Health History In EMR: Yes  Reviewed Medications In EMR: Yes  Reviewed Allergies In EMR: Yes  Reviewed Surgeries / Procedures: Yes  Date of Onset of Symptoms: 02/17/2013  Guideline(s) Used:  Chest Pain  Disposition Per Guideline:   See Today in Office  Reason For Disposition Reached:   All other patients with chest pain  Advice Given:  Fleeting Chest Pain:  Fleeting chest pains that last only a few seconds and then go away are generally not serious. They may be from pinched muscles or nerves in your chest wall.  Expected Course:  These mild chest pains usually disappear within 3 days.  Call Back If:  Severe chest pain  Constant chest pain lasting longer than 5 minutes  Difficulty breathing  Fever  You become worse.  Patient Will Follow Care Advice:  YES

## 2013-02-18 NOTE — Telephone Encounter (Signed)
I spoke with pt and he is not having constant pain. I gave Dr. Clent Ridges this information along with triage note and he said that it sounds like its muscle skeletal related. Pt did not want to go to ER, however he did make appointment for 02/19/13 to see Dr. Clent Ridges.

## 2013-02-19 ENCOUNTER — Telehealth: Payer: Self-pay | Admitting: Family Medicine

## 2013-02-19 NOTE — Telephone Encounter (Signed)
Patient Information:  Caller Name: Patricio  Phone: [phone]  Patient: Young, Mulvey  Gender: Male  DOB: October 06, 1955  Age: 57 Years  PCP: Gershon Crane [facility])  Office Follow Up:  Does the office need to follow up with this patient?: Yes  Instructions For The Office: Please note appointment scheduled for 02/20/13 per caller request.  RN Note:  Reported left sided chest pain, worse with deep breath, is improving but still would like to get checked out by MD. No appointments remain with Dr Clent Ridges   for 02/19/13.  Offered to check with MD for possible work in but [place] requested appointment be scheduled for 02/20/13, if possible.  Appointment scheduled for 0930 02/20/13 with Dr Clent Ridges per caller request.  Symptoms  Reason For Call & Symptoms: Intermittent chest pain; 2nd call in 24 hours. Declined repeat triage.   Was told by office nurse 02/18/13 that someone would call back within 10-15 minutes to schedule appointment for 02/19/13 but never received call from office.  Reviewed Health History In EMR: No  Reviewed Medications In EMR: No  Reviewed Allergies In EMR: Yes  Reviewed Surgeries / Procedures: Yes  Date of Onset of Symptoms: 02/17/2013  Guideline(s) Used:  No Protocol Available - Information Only  Disposition Per Guideline:   Discuss with PCP and Callback by Nurse Today  Reason For Disposition Reached:   Nursing judgment  Advice Given:  N/A  Patient Will Follow Care Advice:  YES

## 2013-02-19 NOTE — Telephone Encounter (Signed)
Dr. Clent Ridges is aware that pt is coming in the office on 02/20/13

## 2013-02-20 ENCOUNTER — Encounter: Payer: Self-pay | Admitting: Family Medicine

## 2013-02-20 ENCOUNTER — Ambulatory Visit (INDEPENDENT_AMBULATORY_CARE_PROVIDER_SITE_OTHER): Payer: BC Managed Care – PPO | Admitting: Family Medicine

## 2013-02-20 VITALS — BP 148/86 | Temp 98.8°F | Wt 248.0 lb

## 2013-02-20 DIAGNOSIS — Z23 Encounter for immunization: Secondary | ICD-10-CM

## 2013-02-20 DIAGNOSIS — R079 Chest pain, unspecified: Secondary | ICD-10-CM

## 2013-02-20 DIAGNOSIS — E291 Testicular hypofunction: Secondary | ICD-10-CM

## 2013-02-20 NOTE — Progress Notes (Signed)
Pre visit review using our clinic review tool, if applicable. No additional management support is needed unless otherwise documented below in the visit note. 

## 2013-02-20 NOTE — Addendum Note (Signed)
Addended by: Aniceto Boss A on: 02/20/2013 10:47 AM   Modules accepted: Orders

## 2013-02-20 NOTE — Progress Notes (Signed)
  Subjective:    Patient ID: [name] [name], male    DOB: 14-Dec-1955, 57 y.o.   MRN: [id number]  HPI Here for several days of left sided chest pains that started the morning after he did a lot of yard work, including mowing and blowing leaves. The pain was sharp and it hurt when he twisted his trunk or took a deep breath. No SOB or nausea or sweats. The pain has improved daily and today he has not pain at all.    Review of Systems  Constitutional: Negative.   Respiratory: Negative.   Cardiovascular: Negative.        Objective:   Physical Exam  Constitutional: He appears well-developed and well-nourished.  Cardiovascular: Normal rate, regular rhythm, normal heart sounds and intact distal pulses.   EKG normal  Pulmonary/Chest: Effort normal and breath sounds normal. He exhibits no tenderness.          Assessment & Plan:  Chest pain, resolved. He is free to exercise as much as he wants.

## 2013-02-24 MED ORDER — TESTOSTERONE 20.25 MG/1.25GM (1.62%) TD GEL: 4.0000 "application " | Freq: Every day | TRANSDERMAL | Status: DC

## 2013-02-24 NOTE — Addendum Note (Signed)
Addended by: Aniceto Boss A on: 02/24/2013 04:55 PM   Modules accepted: Orders

## 2013-06-09 ENCOUNTER — Ambulatory Visit (INDEPENDENT_AMBULATORY_CARE_PROVIDER_SITE_OTHER): Payer: BC Managed Care – PPO | Admitting: Family Medicine

## 2013-06-09 ENCOUNTER — Encounter: Payer: Self-pay | Admitting: Family Medicine

## 2013-06-09 VITALS — BP 156/90 | Temp 98.3°F | Ht 71.5 in | Wt 240.0 lb

## 2013-06-09 DIAGNOSIS — L42 Pityriasis rosea: Secondary | ICD-10-CM

## 2013-06-09 MED ORDER — AMLODIPINE BESYLATE 5 MG PO TABS: 5.0000 mg | ORAL_TABLET | Freq: Every day | ORAL | Status: DC

## 2013-06-09 MED ORDER — TRIAMCINOLONE ACETONIDE 0.1 % EX CREA: 1.0000 "application " | TOPICAL_CREAM | Freq: Three times a day (TID) | CUTANEOUS | Status: DC

## 2013-06-09 NOTE — Progress Notes (Signed)
Pre visit review using our clinic review tool, if applicable. No additional management support is needed unless otherwise documented below in the visit note. 

## 2013-06-10 ENCOUNTER — Encounter: Payer: Self-pay | Admitting: Family Medicine

## 2013-06-10 NOTE — Progress Notes (Signed)
   Subjective:    Patient ID: [name] [name], male    DOB: 16-Oct-1955, 58 y.o.   MRN: [id number]  HPI Here for a rash on the trunk which started 2 weeks ago. This itches and burns. No other sx.    Review of Systems  Constitutional: Negative.   Skin: Positive for rash.       Objective:   Physical Exam  Constitutional: He appears well-developed and well-nourished.  Skin:  Widespread macular or slightly raised red patches of rash over the trunk. Some spots are smaller but most have coalesced into large patches          Assessment & Plan:  Reassured this is benign and self limited. Use Benadryl and Aveeno soaks. Try Triamcinolone cream.

## 2013-11-26 ENCOUNTER — Other Ambulatory Visit (INDEPENDENT_AMBULATORY_CARE_PROVIDER_SITE_OTHER): Payer: BC Managed Care – PPO

## 2013-11-26 DIAGNOSIS — Z0000 Encounter for general adult medical examination without abnormal findings: Secondary | ICD-10-CM

## 2013-11-26 LAB — CBC WITH DIFFERENTIAL/PLATELET
BASOS PCT: 0.8 % (ref 0.0–3.0)
Basophils Absolute: 0 10*3/uL (ref 0.0–0.1)
EOS PCT: 1.9 % (ref 0.0–5.0)
Eosinophils Absolute: 0.1 10*3/uL (ref 0.0–0.7)
HEMATOCRIT: 47.6 % (ref 39.0–52.0)
HEMOGLOBIN: 16.2 g/dL (ref 13.0–17.0)
Lymphocytes Relative: 20.1 % (ref 12.0–46.0)
Lymphs Abs: 1 10*3/uL (ref 0.7–4.0)
MCHC: 34 g/dL (ref 30.0–36.0)
MCV: 87.5 fl (ref 78.0–100.0)
MONO ABS: 0.3 10*3/uL (ref 0.1–1.0)
Monocytes Relative: 6.9 % (ref 3.0–12.0)
NEUTROS ABS: 3.5 10*3/uL (ref 1.4–7.7)
NEUTROS PCT: 70.3 % (ref 43.0–77.0)
Platelets: 174 10*3/uL (ref 150.0–400.0)
RBC: 5.44 Mil/uL (ref 4.22–5.81)
RDW: 13.1 % (ref 11.5–15.5)
WBC: 4.9 10*3/uL (ref 4.0–10.5)

## 2013-11-26 LAB — POCT URINALYSIS DIPSTICK
Bilirubin, UA: NEGATIVE
Glucose, UA: NEGATIVE
Ketones, UA: NEGATIVE
Leukocytes, UA: NEGATIVE
NITRITE UA: NEGATIVE
Spec Grav, UA: 1.015
UROBILINOGEN UA: 0.2
pH, UA: 5.5

## 2013-11-26 LAB — LIPID PANEL
Cholesterol: 158 mg/dL (ref 0–200)
HDL: 32.9 mg/dL — ABNORMAL LOW (ref >39.00)
LDL Cholesterol: 107 mg/dL — ABNORMAL HIGH (ref 0–99)
NONHDL: 125.1
Total CHOL/HDL Ratio: 5
Triglycerides: 92 mg/dL (ref 0.0–149.0)
VLDL: 18.4 mg/dL (ref 0.0–40.0)

## 2013-11-26 LAB — HEPATIC FUNCTION PANEL
ALT: 13 U/L (ref 0–53)
AST: 18 U/L (ref 0–37)
Albumin: 4.3 g/dL (ref 3.5–5.2)
Alkaline Phosphatase: 52 U/L (ref 39–117)
Bilirubin, Direct: 0.2 mg/dL (ref 0.0–0.3)
TOTAL PROTEIN: 6.9 g/dL (ref 6.0–8.3)
Total Bilirubin: 1.3 mg/dL — ABNORMAL HIGH (ref 0.2–1.2)

## 2013-11-26 LAB — BASIC METABOLIC PANEL
BUN: 17 mg/dL (ref 6–23)
CHLORIDE: 107 meq/L (ref 96–112)
CO2: 29 mEq/L (ref 19–32)
CREATININE: 1.1 mg/dL (ref 0.4–1.5)
Calcium: 9.6 mg/dL (ref 8.4–10.5)
GFR: 73.51 mL/min (ref >60.00)
Glucose, Bld: 85 mg/dL (ref 70–99)
POTASSIUM: 5 meq/L (ref 3.5–5.1)
SODIUM: 143 meq/L (ref 135–145)

## 2013-11-26 LAB — PSA: PSA: 0.63 ng/mL (ref 0.10–4.00)

## 2013-11-26 LAB — TSH: TSH: 0.9 u[IU]/mL (ref 0.35–4.50)

## 2013-12-03 ENCOUNTER — Ambulatory Visit (INDEPENDENT_AMBULATORY_CARE_PROVIDER_SITE_OTHER): Payer: BC Managed Care – PPO | Admitting: Family Medicine

## 2013-12-03 ENCOUNTER — Encounter: Payer: Self-pay | Admitting: Family Medicine

## 2013-12-03 VITALS — BP 169/93 | HR 60 | Temp 98.3°F | Ht 71.5 in | Wt 209.0 lb

## 2013-12-03 DIAGNOSIS — Z0000 Encounter for general adult medical examination without abnormal findings: Secondary | ICD-10-CM

## 2013-12-03 DIAGNOSIS — Z23 Encounter for immunization: Secondary | ICD-10-CM

## 2013-12-03 MED ORDER — LISINOPRIL-HYDROCHLOROTHIAZIDE 20-12.5 MG PO TABS: 1.0000 | ORAL_TABLET | Freq: Every day | ORAL | Status: DC

## 2013-12-03 NOTE — Progress Notes (Signed)
Pre visit review using our clinic review tool, if applicable. No additional management support is needed unless otherwise documented below in the visit note. 

## 2013-12-04 ENCOUNTER — Encounter: Payer: Self-pay | Admitting: Family Medicine

## 2013-12-04 NOTE — Progress Notes (Signed)
   Subjective:    Patient ID: HAPPY [place], male    DOB: 07-12-1955, 57 y.o.   MRN: [id number]  HPI 57 yr old male for a cpx. He is doing well. He had stopped taking both BP meds about 6 months ago to focus on diet and exercise, and in fact he has lost a total of 50 lbs over the past year. However his BP remains mildly elevated.    Review of Systems  Constitutional: Negative.   HENT: Negative.   Eyes: Negative.   Respiratory: Negative.   Cardiovascular: Negative.   Gastrointestinal: Negative.   Genitourinary: Negative.   Musculoskeletal: Negative.   Skin: Negative.   Neurological: Negative.   Psychiatric/Behavioral: Negative.        Objective:   Physical Exam  Constitutional: He is oriented to person, place, and time. He appears well-developed and well-nourished. No distress.  HENT:  Head: Normocephalic and atraumatic.  Right Ear: External ear normal.  Left Ear: External ear normal.  Nose: Nose normal.  Mouth/Throat: Oropharynx is clear and moist. No oropharyngeal exudate.  Eyes: Conjunctivae and EOM are normal. Pupils are equal, round, and reactive to light. Right eye exhibits no discharge. Left eye exhibits no discharge. No scleral icterus.  Neck: Neck supple. No JVD present. No tracheal deviation present. No thyromegaly present.  Cardiovascular: Normal rate, regular rhythm, normal heart sounds and intact distal pulses.  Exam reveals no gallop and no friction rub.   No murmur heard. Pulmonary/Chest: Effort normal and breath sounds normal. No respiratory distress. He has no wheezes. He has no rales. He exhibits no tenderness.  Abdominal: Soft. Bowel sounds are normal. He exhibits no distension and no mass. There is no tenderness. There is no rebound and no guarding.  Genitourinary: Rectum normal, prostate normal and penis normal. Guaiac negative stool. No penile tenderness.  Musculoskeletal: Normal range of motion. He exhibits no edema and no tenderness.  Lymphadenopathy:   He has no cervical adenopathy.  Neurological: He is alert and oriented to person, place, and time. He has normal reflexes. No cranial nerve deficit. He exhibits normal muscle tone. Coordination normal.  Skin: Skin is warm and dry. No rash noted. He is not diaphoretic. No erythema. No pallor.  Psychiatric: He has a normal mood and affect. His behavior is normal. Judgment and thought content normal.          Assessment & Plan:  Well exam. We decided to stay off Norvasc but he will get back on Lisinopril HCT. Recheck in 90 days

## 2014-01-01 ENCOUNTER — Other Ambulatory Visit: Payer: Self-pay | Admitting: Dermatology

## 2014-04-10 HISTORY — PX: CATARACT EXTRACTION, BILATERAL: SHX1313

## 2014-06-09 HISTORY — PX: RETINAL DETACHMENT SURGERY: SHX105

## 2014-06-15 ENCOUNTER — Encounter (INDEPENDENT_AMBULATORY_CARE_PROVIDER_SITE_OTHER): Payer: BLUE CROSS/BLUE SHIELD | Admitting: Ophthalmology

## 2014-06-15 ENCOUNTER — Encounter (HOSPITAL_COMMUNITY): Payer: Self-pay | Admitting: *Deleted

## 2014-06-15 DIAGNOSIS — H35033 Hypertensive retinopathy, bilateral: Secondary | ICD-10-CM

## 2014-06-15 DIAGNOSIS — H43813 Vitreous degeneration, bilateral: Secondary | ICD-10-CM | POA: Diagnosis not present

## 2014-06-15 DIAGNOSIS — H33011 Retinal detachment with single break, right eye: Secondary | ICD-10-CM

## 2014-06-15 DIAGNOSIS — H4311 Vitreous hemorrhage, right eye: Secondary | ICD-10-CM | POA: Diagnosis not present

## 2014-06-15 DIAGNOSIS — H2513 Age-related nuclear cataract, bilateral: Secondary | ICD-10-CM

## 2014-06-15 DIAGNOSIS — I10 Essential (primary) hypertension: Secondary | ICD-10-CM

## 2014-06-15 NOTE — Progress Notes (Signed)
   06/15/14 1857  OBSTRUCTIVE SLEEP APNEA  Have you ever been diagnosed with sleep apnea through a sleep study? No  Do you snore loudly (loud enough to be heard through closed doors)?  0  Do you often feel tired, fatigued, or sleepy during the daytime? 0  Has anyone observed you stop breathing during your sleep? 0  Do you have, or are you being treated for high blood pressure? 1  BMI more than 35 kg/m2? 0  Age over 60 years old? 1  Neck circumference greater than 40 cm/16 inches? 1  Gender: 1

## 2014-06-15 NOTE — H&P (Signed)
[name] [name] is an 59 y.o. male.   Chief Complaint:Sudden loss of vision right eye HPI: two days ago noted sudden cloudy vision right eye  Past Medical History  Diagnosis Date  . Hypertension   . Hyperlipidemia   . Hx of colonic polyps     pt unsure,thinks this was done in 1982 in [place]. no reports in EPIC  . Syncopal episodes 2008  . Ankle fracture   . Patella fracture   . Cancer 1974    testicular cancer-at age 35  . Kidney stones     Past Surgical History  Procedure Laterality Date  . Knee arthroscopy    . Hydrocele excision    . Orchiectomy      right testicle  . Colonoscopy  10-21-12    per Dr. Sharlett Iles, adenomatous polyp, repeat in 3 yrs     Family History  Problem Relation Age of Onset  . Coronary artery disease      fhx  . Hypertension      fhx  . Sudden death      fhx  . Colon cancer Neg Hx    Social History:  reports that he has never smoked. He has never used smokeless tobacco. He reports that he does not drink alcohol or use illicit drugs.  Allergies: No Known Allergies  No prescriptions prior to admission    Review of systems otherwise negative  There were no vitals taken for this visit.  Physical exam: Mental status: oriented x3. Eyes: See eye exam associated with this date of surgery in media tab.  Scanned in by scanning center Ears, Nose, Throat: within normal limits Neck: Within Normal limits General: within normal limits Chest: Within normal limits Breast: deferred Heart: Within normal limits Abdomen: Within normal limits GU: deferred Extremities: within normal limits Skin: within normal limits  Assessment/Plan Rhegmatogenous retinal detachment with temporal retinal break.  Vitreous hemorrhage  Right eye Plan: To [facility] for Pars plana vitrctomy, laser treatment, gas injection right eye  Possible scleral buckle, if needed right eye  Hayden Pedro 06/15/2014, 4:56 PM

## 2014-06-16 ENCOUNTER — Encounter (HOSPITAL_COMMUNITY): Payer: Self-pay | Admitting: *Deleted

## 2014-06-16 ENCOUNTER — Encounter (HOSPITAL_COMMUNITY)
Admission: RE | Disposition: A | Discharge status: Home / Self Care | Payer: Self-pay | Source: Ambulatory Visit | Attending: Ophthalmology

## 2014-06-16 ENCOUNTER — Ambulatory Visit (HOSPITAL_COMMUNITY): Payer: BLUE CROSS/BLUE SHIELD | Admitting: Certified Registered"

## 2014-06-16 ENCOUNTER — Ambulatory Visit (HOSPITAL_COMMUNITY)
Admission: RE | Admit: 2014-06-16 | Discharge: 2014-06-17 | Disposition: A | Discharge status: Home / Self Care | Payer: BLUE CROSS/BLUE SHIELD | Source: Ambulatory Visit | Attending: Ophthalmology | Admitting: Ophthalmology

## 2014-06-16 DIAGNOSIS — I10 Essential (primary) hypertension: Secondary | ICD-10-CM | POA: Diagnosis not present

## 2014-06-16 DIAGNOSIS — H33001 Unspecified retinal detachment with retinal break, right eye: Secondary | ICD-10-CM | POA: Insufficient documentation

## 2014-06-16 DIAGNOSIS — Z9852 Vasectomy status: Secondary | ICD-10-CM | POA: Diagnosis not present

## 2014-06-16 DIAGNOSIS — E785 Hyperlipidemia, unspecified: Secondary | ICD-10-CM | POA: Insufficient documentation

## 2014-06-16 DIAGNOSIS — G4733 Obstructive sleep apnea (adult) (pediatric): Secondary | ICD-10-CM | POA: Diagnosis not present

## 2014-06-16 DIAGNOSIS — H53131 Sudden visual loss, right eye: Secondary | ICD-10-CM | POA: Diagnosis present

## 2014-06-16 DIAGNOSIS — K219 Gastro-esophageal reflux disease without esophagitis: Secondary | ICD-10-CM | POA: Insufficient documentation

## 2014-06-16 DIAGNOSIS — Z8547 Personal history of malignant neoplasm of testis: Secondary | ICD-10-CM | POA: Insufficient documentation

## 2014-06-16 DIAGNOSIS — Z86010 Personal history of colonic polyps: Secondary | ICD-10-CM | POA: Insufficient documentation

## 2014-06-16 DIAGNOSIS — H4311 Vitreous hemorrhage, right eye: Secondary | ICD-10-CM | POA: Insufficient documentation

## 2014-06-16 DIAGNOSIS — Z87442 Personal history of urinary calculi: Secondary | ICD-10-CM | POA: Insufficient documentation

## 2014-06-16 HISTORY — PX: AIR/FLUID EXCHANGE: SHX6494

## 2014-06-16 HISTORY — DX: Horseshoe tear of retina without detachment, right eye: H33.311

## 2014-06-16 HISTORY — PX: REPAIR OF COMPLEX TRACTION RETINAL DETACHMENT: SHX6217

## 2014-06-16 HISTORY — PX: GAS INSERTION: SHX5336

## 2014-06-16 HISTORY — DX: Gastro-esophageal reflux disease without esophagitis: K21.9

## 2014-06-16 HISTORY — PX: LASER PHOTO ABLATION: SHX5942

## 2014-06-16 HISTORY — PX: VITRECTOMY: SHX106

## 2014-06-16 LAB — CBC
HCT: 43.6 % (ref 39.0–52.0)
Hemoglobin: 15.4 g/dL (ref 13.0–17.0)
MCH: 30 pg (ref 26.0–34.0)
MCHC: 35.3 g/dL (ref 30.0–36.0)
MCV: 84.8 fL (ref 78.0–100.0)
PLATELETS: 146 10*3/uL — AB (ref 150–400)
RBC: 5.14 MIL/uL (ref 4.22–5.81)
RDW: 12 % (ref 11.5–15.5)
WBC: 4.4 10*3/uL (ref 4.0–10.5)

## 2014-06-16 LAB — BASIC METABOLIC PANEL
Anion gap: 8 (ref 5–15)
BUN: 17 mg/dL (ref 6–23)
CALCIUM: 9.1 mg/dL (ref 8.4–10.5)
CHLORIDE: 106 mmol/L (ref 96–112)
CO2: 26 mmol/L (ref 19–32)
Creatinine, Ser: 0.98 mg/dL (ref 0.50–1.35)
GFR calc Af Amer: >90 mL/min (ref >90)
GFR calc non Af Amer: 88 mL/min — ABNORMAL LOW (ref >90)
Glucose, Bld: 102 mg/dL — ABNORMAL HIGH (ref 70–99)
Potassium: 4.2 mmol/L (ref 3.5–5.1)
Sodium: 140 mmol/L (ref 135–145)

## 2014-06-16 SURGERY — 25 GAUGE PARS PLANA VITRECTOMY WITH 23 GAUGE MVR PORT
Anesthesia: General | Site: Eye | Laterality: Right

## 2014-06-16 MED ORDER — PROPOFOL 10 MG/ML IV BOLUS
INTRAVENOUS | Status: AC
  Filled 2014-06-16: qty 20

## 2014-06-16 MED ORDER — CYCLOPENTOLATE HCL 1 % OP SOLN: 1.0000 [drp] | OPHTHALMIC | Status: DC | PRN

## 2014-06-16 MED ORDER — LIDOCAINE HCL (CARDIAC) 20 MG/ML IV SOLN
INTRAVENOUS | Status: DC | PRN
  Administered 2014-06-16: 60 mg via INTRAVENOUS

## 2014-06-16 MED ORDER — SUCCINYLCHOLINE CHLORIDE 20 MG/ML IJ SOLN
INTRAMUSCULAR | Status: AC
  Filled 2014-06-16: qty 1

## 2014-06-16 MED ORDER — MIDAZOLAM HCL 2 MG/2ML IJ SOLN
INTRAMUSCULAR | Status: AC
  Filled 2014-06-16: qty 2

## 2014-06-16 MED ORDER — BUPIVACAINE HCL (PF) 0.75 % IJ SOLN
INTRAMUSCULAR | Status: DC | PRN
Start: 2014-06-16 — End: 2014-06-16
  Administered 2014-06-16: 20 mL

## 2014-06-16 MED ORDER — GLYCOPYRROLATE 0.2 MG/ML IJ SOLN
INTRAMUSCULAR | Status: DC | PRN
  Administered 2014-06-16: 0.6 mg via INTRAVENOUS

## 2014-06-16 MED ORDER — 0.9 % SODIUM CHLORIDE (POUR BTL) OPTIME
TOPICAL | Status: DC | PRN
  Administered 2014-06-16: 1000 mL

## 2014-06-16 MED ORDER — BRIMONIDINE TARTRATE 0.2 % OP SOLN
1.0000 [drp] | Freq: Two times a day (BID) | OPHTHALMIC | Status: DC
  Filled 2014-06-16: qty 5

## 2014-06-16 MED ORDER — TEMAZEPAM 15 MG PO CAPS: 15.0000 mg | ORAL_CAPSULE | Freq: Every evening | ORAL | Status: DC | PRN

## 2014-06-16 MED ORDER — BSS IO SOLN
INTRAOCULAR | Status: AC
  Filled 2014-06-16: qty 15

## 2014-06-16 MED ORDER — ONDANSETRON HCL 4 MG/2ML IJ SOLN
INTRAMUSCULAR | Status: DC | PRN
  Administered 2014-06-16: 4 mg via INTRAVENOUS

## 2014-06-16 MED ORDER — EPINEPHRINE HCL 1 MG/ML IJ SOLN
INTRAOCULAR | Status: DC | PRN
  Administered 2014-06-16: 14:00:00

## 2014-06-16 MED ORDER — GATIFLOXACIN 0.5 % OP SOLN
1.0000 [drp] | Freq: Four times a day (QID) | OPHTHALMIC | Status: DC
  Filled 2014-06-16: qty 2.5

## 2014-06-16 MED ORDER — PROPOFOL 10 MG/ML IV BOLUS
INTRAVENOUS | Status: DC | PRN
  Administered 2014-06-16: 200 mg via INTRAVENOUS

## 2014-06-16 MED ORDER — ACETAZOLAMIDE SODIUM 500 MG IJ SOLR
500.0000 mg | Freq: Once | INTRAMUSCULAR | Status: AC
  Administered 2014-06-17: 500 mg via INTRAVENOUS
  Filled 2014-06-16: qty 500

## 2014-06-16 MED ORDER — PREDNISOLONE ACETATE 1 % OP SUSP
1.0000 [drp] | Freq: Four times a day (QID) | OPHTHALMIC | Status: DC
  Filled 2014-06-16: qty 5

## 2014-06-16 MED ORDER — BUPIVACAINE HCL (PF) 0.75 % IJ SOLN
INTRAMUSCULAR | Status: AC
  Filled 2014-06-16: qty 10

## 2014-06-16 MED ORDER — CEFAZOLIN SODIUM-DEXTROSE 2-3 GM-% IV SOLR
2.0000 g | INTRAVENOUS | Status: AC
  Administered 2014-06-16: 2 g via INTRAVENOUS
  Filled 2014-06-16: qty 50

## 2014-06-16 MED ORDER — POLYMYXIN B SULFATE 500000 UNITS IJ SOLR
INTRAMUSCULAR | Status: DC | PRN
  Administered 2014-06-16: 10 mL

## 2014-06-16 MED ORDER — SODIUM CHLORIDE 0.9 % IJ SOLN
INTRAMUSCULAR | Status: AC
  Filled 2014-06-16: qty 10

## 2014-06-16 MED ORDER — ONDANSETRON HCL 4 MG/2ML IJ SOLN
4.0000 mg | Freq: Four times a day (QID) | INTRAMUSCULAR | Status: DC | PRN
  Filled 2014-06-16: qty 2

## 2014-06-16 MED ORDER — TETRACAINE HCL 0.5 % OP SOLN
2.0000 [drp] | Freq: Once | OPHTHALMIC | Status: DC
  Filled 2014-06-16: qty 2

## 2014-06-16 MED ORDER — BSS IO SOLN
INTRAOCULAR | Status: DC | PRN
  Administered 2014-06-16: 15 mL via INTRAOCULAR

## 2014-06-16 MED ORDER — SODIUM HYALURONATE 10 MG/ML IO SOLN
INTRAOCULAR | Status: DC | PRN
  Administered 2014-06-16: 0.85 mL via INTRAOCULAR

## 2014-06-16 MED ORDER — BACITRACIN-POLYMYXIN B 500-10000 UNIT/GM OP OINT
TOPICAL_OINTMENT | OPHTHALMIC | Status: AC
  Filled 2014-06-16: qty 3.5

## 2014-06-16 MED ORDER — LIDOCAINE HCL (CARDIAC) 20 MG/ML IV SOLN
INTRAVENOUS | Status: AC
  Filled 2014-06-16: qty 5

## 2014-06-16 MED ORDER — PROMETHAZINE HCL 25 MG/ML IJ SOLN: 6.2500 mg | INTRAMUSCULAR | Status: DC | PRN

## 2014-06-16 MED ORDER — TROPICAMIDE 1 % OP SOLN: 1.0000 [drp] | OPHTHALMIC | Status: DC | PRN

## 2014-06-16 MED ORDER — ROCURONIUM BROMIDE 50 MG/5ML IV SOLN
INTRAVENOUS | Status: AC
  Filled 2014-06-16: qty 1

## 2014-06-16 MED ORDER — BACITRACIN-POLYMYXIN B 500-10000 UNIT/GM OP OINT
1.0000 "application " | TOPICAL_OINTMENT | Freq: Three times a day (TID) | OPHTHALMIC | Status: DC
  Filled 2014-06-16: qty 3.5

## 2014-06-16 MED ORDER — GENTAMICIN SULFATE 40 MG/ML IJ SOLN
INTRAMUSCULAR | Status: AC
  Filled 2014-06-16: qty 2

## 2014-06-16 MED ORDER — TROPICAMIDE 1 % OP SOLN
1.0000 [drp] | OPHTHALMIC | Status: AC | PRN
  Administered 2014-06-16 (×3): 1 [drp] via OPHTHALMIC
  Filled 2014-06-16: qty 3

## 2014-06-16 MED ORDER — ACETAMINOPHEN 325 MG PO TABS
325.0000 mg | ORAL_TABLET | ORAL | Status: DC | PRN
  Administered 2014-06-16: 325 mg via ORAL
  Filled 2014-06-16: qty 1

## 2014-06-16 MED ORDER — PHENYLEPHRINE HCL 2.5 % OP SOLN
1.0000 [drp] | OPHTHALMIC | Status: AC | PRN
  Administered 2014-06-16 (×3): 1 [drp] via OPHTHALMIC
  Filled 2014-06-16: qty 2

## 2014-06-16 MED ORDER — POLYMYXIN B SULFATE 500000 UNITS IJ SOLR
INTRAMUSCULAR | Status: AC
  Filled 2014-06-16: qty 1

## 2014-06-16 MED ORDER — FENTANYL CITRATE 0.05 MG/ML IJ SOLN
INTRAMUSCULAR | Status: AC
  Filled 2014-06-16: qty 5

## 2014-06-16 MED ORDER — DEXAMETHASONE SODIUM PHOSPHATE 10 MG/ML IJ SOLN
INTRAMUSCULAR | Status: AC
  Filled 2014-06-16: qty 1

## 2014-06-16 MED ORDER — LACTATED RINGERS IV SOLN: INTRAVENOUS | Status: DC

## 2014-06-16 MED ORDER — SODIUM CHLORIDE 0.45 % IV SOLN
INTRAVENOUS | Status: DC
  Administered 2014-06-16: 18:00:00 via INTRAVENOUS

## 2014-06-16 MED ORDER — HYDROMORPHONE HCL 1 MG/ML IJ SOLN: 0.2500 mg | INTRAMUSCULAR | Status: DC | PRN

## 2014-06-16 MED ORDER — LISINOPRIL 20 MG PO TABS
20.0000 mg | ORAL_TABLET | Freq: Every day | ORAL | Status: DC
Start: 2014-06-16 — End: 2014-06-17
  Administered 2014-06-16: 20 mg via ORAL
  Filled 2014-06-16: qty 1

## 2014-06-16 MED ORDER — MAGNESIUM HYDROXIDE 400 MG/5ML PO SUSP: 15.0000 mL | Freq: Four times a day (QID) | ORAL | Status: DC | PRN

## 2014-06-16 MED ORDER — BSS PLUS IO SOLN
INTRAOCULAR | Status: AC
  Filled 2014-06-16: qty 500

## 2014-06-16 MED ORDER — DEXAMETHASONE SODIUM PHOSPHATE 10 MG/ML IJ SOLN
INTRAMUSCULAR | Status: DC | PRN
  Administered 2014-06-16: 10 mg

## 2014-06-16 MED ORDER — GATIFLOXACIN 0.5 % OP SOLN: 1.0000 [drp] | OPHTHALMIC | Status: DC | PRN

## 2014-06-16 MED ORDER — GATIFLOXACIN 0.5 % OP SOLN
1.0000 [drp] | OPHTHALMIC | Status: AC | PRN
  Administered 2014-06-16 (×3): 1 [drp] via OPHTHALMIC
  Filled 2014-06-16: qty 2.5

## 2014-06-16 MED ORDER — SODIUM HYALURONATE 10 MG/ML IO SOLN
INTRAOCULAR | Status: AC
  Filled 2014-06-16: qty 0.85

## 2014-06-16 MED ORDER — MIDAZOLAM HCL 5 MG/5ML IJ SOLN
INTRAMUSCULAR | Status: DC | PRN
  Administered 2014-06-16: 2 mg via INTRAVENOUS

## 2014-06-16 MED ORDER — SODIUM CHLORIDE 0.9 % IV SOLN
INTRAVENOUS | Status: DC
  Administered 2014-06-16 (×2): via INTRAVENOUS

## 2014-06-16 MED ORDER — BACITRACIN-POLYMYXIN B 500-10000 UNIT/GM OP OINT
TOPICAL_OINTMENT | OPHTHALMIC | Status: DC | PRN
  Administered 2014-06-16: 1 via OPHTHALMIC

## 2014-06-16 MED ORDER — LATANOPROST 0.005 % OP SOLN
1.0000 [drp] | Freq: Every day | OPHTHALMIC | Status: DC
  Filled 2014-06-16: qty 2.5

## 2014-06-16 MED ORDER — MORPHINE SULFATE 2 MG/ML IJ SOLN: 1.0000 mg | INTRAMUSCULAR | Status: DC | PRN

## 2014-06-16 MED ORDER — DOCUSATE SODIUM 100 MG PO CAPS
100.0000 mg | ORAL_CAPSULE | Freq: Two times a day (BID) | ORAL | Status: DC
  Administered 2014-06-16: 100 mg via ORAL
  Filled 2014-06-16: qty 1

## 2014-06-16 MED ORDER — NEOSTIGMINE METHYLSULFATE 10 MG/10ML IV SOLN
INTRAVENOUS | Status: DC | PRN
  Administered 2014-06-16: 4 mg via INTRAVENOUS

## 2014-06-16 MED ORDER — FENTANYL CITRATE 0.05 MG/ML IJ SOLN
INTRAMUSCULAR | Status: DC | PRN
  Administered 2014-06-16: 150 ug via INTRAVENOUS

## 2014-06-16 MED ORDER — PHENYLEPHRINE HCL 2.5 % OP SOLN: 1.0000 [drp] | OPHTHALMIC | Status: DC | PRN

## 2014-06-16 MED ORDER — ATROPINE SULFATE 1 % OP SOLN
OPHTHALMIC | Status: AC
  Filled 2014-06-16: qty 5

## 2014-06-16 MED ORDER — LISINOPRIL-HYDROCHLOROTHIAZIDE 20-12.5 MG PO TABS: 1.0000 | ORAL_TABLET | Freq: Every day | ORAL | Status: DC

## 2014-06-16 MED ORDER — HYDROCODONE-ACETAMINOPHEN 5-325 MG PO TABS
1.0000 | ORAL_TABLET | ORAL | Status: DC | PRN
  Administered 2014-06-16: 1 via ORAL
  Filled 2014-06-16: qty 1

## 2014-06-16 MED ORDER — HYDROCHLOROTHIAZIDE 12.5 MG PO CAPS
12.5000 mg | ORAL_CAPSULE | Freq: Every day | ORAL | Status: DC
Start: 2014-06-16 — End: 2014-06-17
  Administered 2014-06-16: 12.5 mg via ORAL
  Filled 2014-06-16: qty 1

## 2014-06-16 MED ORDER — ROCURONIUM BROMIDE 100 MG/10ML IV SOLN
INTRAVENOUS | Status: DC | PRN
  Administered 2014-06-16: 35 mg via INTRAVENOUS
  Administered 2014-06-16: 15 mg via INTRAVENOUS

## 2014-06-16 MED ORDER — CYCLOPENTOLATE HCL 1 % OP SOLN
1.0000 [drp] | OPHTHALMIC | Status: AC | PRN
  Administered 2014-06-16 (×3): 1 [drp] via OPHTHALMIC
  Filled 2014-06-16: qty 2

## 2014-06-16 MED ORDER — EPINEPHRINE HCL 1 MG/ML IJ SOLN
INTRAMUSCULAR | Status: AC
  Filled 2014-06-16: qty 1

## 2014-06-16 SURGICAL SUPPLY — 53 items
BALL CTTN LRG ABS STRL LF (GAUZE/BANDAGES/DRESSINGS) ×3
CANNULA DUAL BORE 23G (CANNULA) ×2 IMPLANT
CANNULA VLV SOFT TIP 25G (OPHTHALMIC) ×1 IMPLANT
CANNULA VLV SOFT TIP 25GA (OPHTHALMIC) ×3 IMPLANT
COTTONBALL LRG STERILE PKG (GAUZE/BANDAGES/DRESSINGS) ×9 IMPLANT
COVER MAYO STAND STRL (DRAPES) ×3 IMPLANT
DRAPE INCISE 51X51 W/FILM STRL (DRAPES) ×3 IMPLANT
DRAPE OPHTHALMIC 77X100 STRL (CUSTOM PROCEDURE TRAY) ×3 IMPLANT
FILTER BLUE MILLIPORE (MISCELLANEOUS) IMPLANT
GAS AUTO FILL CONSTEL (OPHTHALMIC) ×6
GAS AUTO FILL CONSTELLATION (OPHTHALMIC) IMPLANT
GAS OPHTHALMIC (MISCELLANEOUS) ×2 IMPLANT
GLOVE SS BIOGEL STRL SZ 6.5 (GLOVE) ×1 IMPLANT
GLOVE SS BIOGEL STRL SZ 7 (GLOVE) ×1 IMPLANT
GLOVE SUPERSENSE BIOGEL SZ 6.5 (GLOVE) ×4
GLOVE SUPERSENSE BIOGEL SZ 7 (GLOVE) ×2
GLOVE SURG 8.5 LATEX PF (GLOVE) ×3 IMPLANT
GOWN STRL REUS W/ TWL LRG LVL3 (GOWN DISPOSABLE) ×3 IMPLANT
GOWN STRL REUS W/TWL LRG LVL3 (GOWN DISPOSABLE) ×9
HANDLE PNEUMATIC FOR CONSTEL (OPHTHALMIC) IMPLANT
KIT BASIN OR (CUSTOM PROCEDURE TRAY) ×3 IMPLANT
KIT PERFLUORON PROCEDURE 5ML (MISCELLANEOUS) IMPLANT
KIT ROOM TURNOVER OR (KITS) ×3 IMPLANT
NDL 18GX1X1/2 (RX/OR ONLY) (NEEDLE) ×1 IMPLANT
NDL 25GX 5/8IN NON SAFETY (NEEDLE) ×1 IMPLANT
NDL FILTER BLUNT 18X1 1/2 (NEEDLE) IMPLANT
NDL HYPO 30X.5 LL (NEEDLE) ×2 IMPLANT
NEEDLE 18GX1X1/2 (RX/OR ONLY) (NEEDLE) ×3 IMPLANT
NEEDLE 25GX 5/8IN NON SAFETY (NEEDLE) ×3 IMPLANT
NEEDLE FILTER BLUNT 18X 1/2SAF (NEEDLE) ×2
NEEDLE FILTER BLUNT 18X1 1/2 (NEEDLE) ×1 IMPLANT
NEEDLE HYPO 30X.5 LL (NEEDLE) ×6 IMPLANT
NS IRRIG 1000ML POUR BTL (IV SOLUTION) ×3 IMPLANT
PACK VITRECTOMY CUSTOM (CUSTOM PROCEDURE TRAY) ×3 IMPLANT
PAD ARMBOARD 7.5X6 YLW CONV (MISCELLANEOUS) ×6 IMPLANT
PAK PIK VITRECTOMY CVS 25GA (OPHTHALMIC) ×3 IMPLANT
PENCIL BIPOLAR 25GA STR DISP (OPHTHALMIC RELATED) ×2 IMPLANT
PIC ILLUMINATED 25G (OPHTHALMIC) ×3
PIK ILLUMINATED 25G (OPHTHALMIC) ×1 IMPLANT
PROBE LASER ILLUM FLEX CVD 25G (OPHTHALMIC) ×2 IMPLANT
REPL STRA BRUSH NDL (NEEDLE) ×1 IMPLANT
REPL STRA BRUSH NEEDLE (NEEDLE) ×3 IMPLANT
RESERVOIR BACK FLUSH (MISCELLANEOUS) ×3 IMPLANT
ROLLS DENTAL (MISCELLANEOUS) ×6 IMPLANT
SCRAPER DIAMOND 25GA (OPHTHALMIC RELATED) ×3 IMPLANT
SPONGE SURGIFOAM ABS GEL 12-7 (HEMOSTASIS) ×3 IMPLANT
SYR 20CC LL (SYRINGE) ×3 IMPLANT
SYR BULB 3OZ (MISCELLANEOUS) ×3 IMPLANT
SYR TB 1ML LUER SLIP (SYRINGE) ×3 IMPLANT
TOWEL OR 17X26 10 PK STRL BLUE (TOWEL DISPOSABLE) ×3 IMPLANT
TUBING HIGH PRESS EXTEN 6IN (TUBING) IMPLANT
WATER STERILE IRR 1000ML POUR (IV SOLUTION) ×3 IMPLANT
WIPE INSTRUMENT VISIWIPE 73X73 (MISCELLANEOUS) ×3 IMPLANT

## 2014-06-16 NOTE — Progress Notes (Signed)
Report given to angel britt rn as caregiver

## 2014-06-16 NOTE — Brief Op Note (Signed)
Brief Operative note   Preoperative diagnosis:  Retinal detachment - right eye Postoperative diagnosis Retinal detachment with vitreous hemorrhage right eye*  Procedures: repair of complex retinal detachment with vitrectomy, laser, gas right eye  Surgeon:  Hayden Pedro, MD...  Assistant:  Deatra Ina SA    Anesthesia: General  Specimen: none  Estimated blood loss:  1cc  Complications: none  Patient sent to PACU in good condition  Composed by Hayden Pedro MD  Dictation number: [id number]

## 2014-06-16 NOTE — Transfer of Care (Signed)
Immediate Anesthesia Transfer of Care Note  Patient: [name] [name]  Procedure(s) Performed: Procedure(s) with comments: 25 GAUGE PARS PLANA VITRECTOMY WITH 23 GAUGE MVR PORT (Right) LASER PHOTO ABLATION (Right) - Headscope laser and endolaser  AIR/FLUID EXCHANGE (Right) INSERTION OF GAS (Right) - C3F8 REPAIR OF COMPLEX TRACTION RETINAL DETACHMENT (Right)  Patient Location: PACU  Anesthesia Type:General  Level of Consciousness: awake and patient cooperative  Airway & Oxygen Therapy: Patient Spontanous Breathing and Patient connected to nasal cannula oxygen  Post-op Assessment: Report given to RN, Post -op Vital signs reviewed and stable and Patient moving all extremities  Post vital signs: Reviewed and stable  Last Vitals:  Filed Vitals:   06/16/14 1512  BP: 125/74  Pulse: 84  Temp: 36.6 C  Resp: 15    Complications: No apparent anesthesia complications

## 2014-06-16 NOTE — Anesthesia Preprocedure Evaluation (Addendum)
Anesthesia Evaluation  Patient identified by MRN, date of birth, ID band Patient awake    Reviewed: Allergy & Precautions, NPO status , Patient's Chart, lab work & pertinent test results  Airway Mallampati: II  TM Distance: >3 FB Neck ROM: Full    Dental  (+) Teeth Intact, Dental Advisory Given   Pulmonary neg pulmonary ROS,  breath sounds clear to auscultation        Cardiovascular hypertension, Pt. on medications Rhythm:Regular Rate:Normal     Neuro/Psych    GI/Hepatic Neg liver ROS, GERD-  Medicated and Controlled,  Endo/Other  negative endocrine ROS  Renal/GU Renal disease  negative genitourinary   Musculoskeletal   Abdominal   Peds  Hematology   Anesthesia Other Findings   Reproductive/Obstetrics                           Anesthesia Physical Anesthesia Plan  ASA: III  Anesthesia Plan: General   Post-op Pain Management:    Induction: Intravenous  Airway Management Planned: Oral ETT  Additional Equipment:   Intra-op Plan:   Post-operative Plan: Extubation in OR  Informed Consent: I have reviewed the patients History and Physical, chart, labs and discussed the procedure including the risks, benefits and alternatives for the proposed anesthesia with the patient or authorized representative who has indicated his/her understanding and acceptance.   Dental advisory given  Plan Discussed with: CRNA and Anesthesiologist  Anesthesia Plan Comments:         Anesthesia Quick Evaluation

## 2014-06-16 NOTE — H&P (Signed)
I examined the patient today and there is no change in the medical status 

## 2014-06-16 NOTE — Progress Notes (Signed)
Called Dr.Edwards for sign out  

## 2014-06-16 NOTE — Anesthesia Procedure Notes (Signed)
Procedure Name: Intubation Date/Time: 06/16/2014 1:53 PM Performed by: Maeola Harman Pre-anesthesia Checklist: Patient identified, Emergency Drugs available, Suction available, Patient being monitored and Timeout performed Patient Re-evaluated:Patient Re-evaluated prior to inductionOxygen Delivery Method: Circle system utilized Intubation Type: IV induction Ventilation: Mask ventilation without difficulty and Oral airway inserted - appropriate to patient size Laryngoscope Size: Mac and 4 Grade View: Grade I Tube type: Oral Tube size: 7.5 mm Number of attempts: 1 Airway Equipment and Method: Stylet Placement Confirmation: ETT inserted through vocal cords under direct vision,  positive ETCO2 and breath sounds checked- equal and bilateral Secured at: 22 cm Tube secured with: Tape Dental Injury: Teeth and Oropharynx as per pre-operative assessment

## 2014-06-17 DIAGNOSIS — H33001 Unspecified retinal detachment with retinal break, right eye: Secondary | ICD-10-CM | POA: Diagnosis not present

## 2014-06-17 MED ORDER — BACITRACIN-POLYMYXIN B 500-10000 UNIT/GM OP OINT: 1.0000 "application " | TOPICAL_OINTMENT | Freq: Three times a day (TID) | OPHTHALMIC | Status: DC

## 2014-06-17 MED ORDER — GATIFLOXACIN 0.5 % OP SOLN: 1.0000 [drp] | Freq: Four times a day (QID) | OPHTHALMIC | Status: DC

## 2014-06-17 MED ORDER — PREDNISOLONE ACETATE 1 % OP SUSP: 1.0000 [drp] | Freq: Four times a day (QID) | OPHTHALMIC | Status: DC

## 2014-06-17 NOTE — Op Note (Signed)
[name] [name], [name] [name] NO.:  [id number]  MEDICAL RECORD NO.:  [id number]  LOCATION:  [id number]                        FACILITY:  [facility]  PHYSICIAN:  Chrystie Nose. Zigmund Daniel, M.D. DATE OF BIRTH:  [birth year]  DATE OF PROCEDURE:  06/16/2014 DATE OF DISCHARGE:                              OPERATIVE REPORT   ADMISSION DIAGNOSES:  Rhegmatogenous retinal detachment, vitreous hemorrhage, all in the right eye.  PROCEDURES: Repair of complex retinal detachment with pars plana vitrectomy, retinectomy, gas fluid exchange, endolaser and indirect ophthalmoscope laser in the right eye, also C3F8 10% exchange that is in the right eye.  SURGEON:  Chrystie Nose. Zigmund Daniel, M.D.  ASSISTANT:  Deatra Ina, SA.  ANESTHESIA:  General.  DETAILS:  After usual prep and drape, the indirect ophthalmoscope laser was moved into place.  The area of retinal detachment was surrounded with indirect ophthalmoscope laser.  The power was 420, 1000 microns each and 0.1 seconds each.  The laser treatment was extended 360 degrees and other weak areas of the retina were treated with laser. Attention was then carried to the pars plana area, where 23-gauge trocar was placed at 2 o'clock and 25-gauge trocars were placed at 8 and 10 o'clock infusion at 8 o'clock.  Provisc placed on the corneal surface and the BIOM viewing system moved into place.  Pars plana vitrectomy begun just behind the cataractous lens.  Dense red blood and mobile blood was encountered.  This was carefully removed under low suction and rapid cutting.  Multiple layers of hemorrhage were encountered and these were removed until the macular surface was visible.  Blood was vacuumed from the macular surface and the retinal surface.  Attention was carried to the area of retinal detachment.  The flap was removed by retinectomy with the vitreous cutter.  Hemostasis was obtained with endolaser at 236 burns with the endolaser replaced with a power of 360 mW  1000 microns each and 0.1 seconds each.  Once the entire vitreous cavity was cleaned, the super wide BIOM viewing system was moved into place.  An additional vitrectomy was carried down to the vitreous base.  A total gas fluid exchange was carried out for full reattachment of the retina. Additional laser was performed in the area of the previously detached retina.  At this point, C3F8 10% was exchanged for intravitreal gas. The instruments were removed from the eye and the trocars were removed from the eye.  The wounds were tested and held until they were secure. Polymyxin and gentamicin were irrigated into tenon space.  Marcaine was injected around the globe for postop pain.  Decadron 10 mg was injected to the lower subconjunctival space.  Polysporin ophthalmic ointment, a patch and shield were placed.  Closing pressure was 10 with a Barraquer tonometer.  COMPLICATIONS:  None.  DURATION:  1 hour.  DISPOSITION:  The patient was awakened and taken to recovery in satisfactory condition.     Chrystie Nose. Zigmund Daniel, M.D.     JDM/MEDQ  D:  06/16/2014  T:  06/17/2014  Job:  [id number]

## 2014-06-17 NOTE — Progress Notes (Signed)
06/17/2014, 6:38 AM  Mental Status:  Awake, Alert, Oriented  Anterior segment: Cornea  Clear    Anterior Chamber Clear    Lens:   Clear  Intra Ocular Pressure 16 mmHg with Tonopen  Vitreous: Clear 95%gas bubble   Retina:  Attached Good laser reaction   Impression: Excellent result Retina attached   Final Diagnosis: Active Problems:   Rhegmatogenous retinal detachment of right eye   Plan: start post operative eye drops.  Discharge to home.  Give post operative instructions  [name] [name] 06/17/2014, 6:38 AM

## 2014-06-17 NOTE — Anesthesia Postprocedure Evaluation (Signed)
  Anesthesia Post-op Note  Patient: [name] [name]  Procedure(s) Performed: Procedure(s) with comments: 25 GAUGE PARS PLANA VITRECTOMY WITH 23 GAUGE MVR PORT (Right) LASER PHOTO ABLATION (Right) - Headscope laser and endolaser  AIR/FLUID EXCHANGE (Right) INSERTION OF GAS (Right) - C3F8 REPAIR OF COMPLEX TRACTION RETINAL DETACHMENT (Right)  Patient Location: PACU  Anesthesia Type:MAC  Level of Consciousness: awake  Airway and Oxygen Therapy: Patient Spontanous Breathing  Post-op Pain: mild  Post-op Assessment: Post-op Vital signs reviewed  Post-op Vital Signs: Reviewed  Last Vitals:  Filed Vitals:   06/17/14 0600  BP: 116/51  Pulse: 54  Temp: 37.2 C  Resp: 16    Complications: No apparent anesthesia complications

## 2014-06-17 NOTE — Discharge Summary (Signed)
Discharge summary not needed on [facility] patients per medical records. 

## 2014-06-18 ENCOUNTER — Encounter (HOSPITAL_COMMUNITY): Payer: Self-pay | Admitting: Ophthalmology

## 2014-06-23 ENCOUNTER — Ambulatory Visit (INDEPENDENT_AMBULATORY_CARE_PROVIDER_SITE_OTHER): Payer: BLUE CROSS/BLUE SHIELD | Admitting: Ophthalmology

## 2014-06-23 DIAGNOSIS — H338 Other retinal detachments: Secondary | ICD-10-CM

## 2014-07-14 ENCOUNTER — Encounter (INDEPENDENT_AMBULATORY_CARE_PROVIDER_SITE_OTHER): Payer: BLUE CROSS/BLUE SHIELD | Admitting: Ophthalmology

## 2014-07-14 DIAGNOSIS — H338 Other retinal detachments: Secondary | ICD-10-CM

## 2014-09-09 HISTORY — PX: CATARACT EXTRACTION: SUR2

## 2014-09-22 ENCOUNTER — Encounter (INDEPENDENT_AMBULATORY_CARE_PROVIDER_SITE_OTHER): Payer: BLUE CROSS/BLUE SHIELD | Admitting: Ophthalmology

## 2014-09-22 DIAGNOSIS — H33011 Retinal detachment with single break, right eye: Secondary | ICD-10-CM | POA: Diagnosis not present

## 2014-09-22 DIAGNOSIS — H2513 Age-related nuclear cataract, bilateral: Secondary | ICD-10-CM

## 2014-09-22 DIAGNOSIS — I10 Essential (primary) hypertension: Secondary | ICD-10-CM

## 2014-09-22 DIAGNOSIS — H35033 Hypertensive retinopathy, bilateral: Secondary | ICD-10-CM | POA: Diagnosis not present

## 2014-09-22 DIAGNOSIS — H43813 Vitreous degeneration, bilateral: Secondary | ICD-10-CM | POA: Diagnosis not present

## 2014-12-22 ENCOUNTER — Other Ambulatory Visit: Payer: Self-pay | Admitting: Family Medicine

## 2015-02-12 ENCOUNTER — Other Ambulatory Visit (INDEPENDENT_AMBULATORY_CARE_PROVIDER_SITE_OTHER): Payer: BLUE CROSS/BLUE SHIELD

## 2015-02-12 DIAGNOSIS — Z0000 Encounter for general adult medical examination without abnormal findings: Secondary | ICD-10-CM | POA: Diagnosis not present

## 2015-02-12 LAB — LIPID PANEL
CHOL/HDL RATIO: 5
Cholesterol: 154 mg/dL (ref 0–200)
HDL: 33.5 mg/dL — AB (ref >39.00)
LDL Cholesterol: 96 mg/dL (ref 0–99)
NONHDL: 120.08
TRIGLYCERIDES: 119 mg/dL (ref 0.0–149.0)
VLDL: 23.8 mg/dL (ref 0.0–40.0)

## 2015-02-12 LAB — POCT URINALYSIS DIPSTICK
Bilirubin, UA: NEGATIVE
Blood, UA: NEGATIVE
GLUCOSE UA: NEGATIVE
KETONES UA: NEGATIVE
LEUKOCYTES UA: NEGATIVE
Nitrite, UA: NEGATIVE
SPEC GRAV UA: 1.02
UROBILINOGEN UA: 0.2
pH, UA: 7

## 2015-02-12 LAB — CBC WITH DIFFERENTIAL/PLATELET
BASOS ABS: 0 10*3/uL (ref 0.0–0.1)
BASOS PCT: 0.6 % (ref 0.0–3.0)
EOS ABS: 0.1 10*3/uL (ref 0.0–0.7)
Eosinophils Relative: 2.5 % (ref 0.0–5.0)
HCT: 50.7 % (ref 39.0–52.0)
Hemoglobin: 17.5 g/dL — ABNORMAL HIGH (ref 13.0–17.0)
LYMPHS ABS: 1.1 10*3/uL (ref 0.7–4.0)
Lymphocytes Relative: 20 % (ref 12.0–46.0)
MCHC: 34.5 g/dL (ref 30.0–36.0)
MCV: 85.3 fl (ref 78.0–100.0)
MONO ABS: 0.4 10*3/uL (ref 0.1–1.0)
Monocytes Relative: 6.8 % (ref 3.0–12.0)
NEUTROS ABS: 4 10*3/uL (ref 1.4–7.7)
NEUTROS PCT: 70.1 % (ref 43.0–77.0)
PLATELETS: 180 10*3/uL (ref 150.0–400.0)
RBC: 5.94 Mil/uL — ABNORMAL HIGH (ref 4.22–5.81)
RDW: 12.7 % (ref 11.5–15.5)
WBC: 5.7 10*3/uL (ref 4.0–10.5)

## 2015-02-12 LAB — BASIC METABOLIC PANEL
BUN: 21 mg/dL (ref 6–23)
CALCIUM: 9.7 mg/dL (ref 8.4–10.5)
CHLORIDE: 106 meq/L (ref 96–112)
CO2: 28 meq/L (ref 19–32)
CREATININE: 0.96 mg/dL (ref 0.40–1.50)
GFR: 84.76 mL/min (ref >60.00)
GLUCOSE: 92 mg/dL (ref 70–99)
Potassium: 4.4 mEq/L (ref 3.5–5.1)
Sodium: 142 mEq/L (ref 135–145)

## 2015-02-12 LAB — HEPATIC FUNCTION PANEL
ALK PHOS: 58 U/L (ref 39–117)
ALT: 17 U/L (ref 0–53)
AST: 16 U/L (ref 0–37)
Albumin: 4.4 g/dL (ref 3.5–5.2)
BILIRUBIN DIRECT: 0.2 mg/dL (ref 0.0–0.3)
Total Bilirubin: 0.9 mg/dL (ref 0.2–1.2)
Total Protein: 6.7 g/dL (ref 6.0–8.3)

## 2015-02-12 LAB — TSH: TSH: 0.94 u[IU]/mL (ref 0.35–4.50)

## 2015-02-12 LAB — PSA: PSA: 0.9 ng/mL (ref 0.10–4.00)

## 2015-02-16 ENCOUNTER — Ambulatory Visit (INDEPENDENT_AMBULATORY_CARE_PROVIDER_SITE_OTHER): Payer: BLUE CROSS/BLUE SHIELD | Admitting: Family Medicine

## 2015-02-16 ENCOUNTER — Encounter: Payer: Self-pay | Admitting: Family Medicine

## 2015-02-16 VITALS — BP 140/81 | HR 79 | Temp 98.8°F | Ht 71.0 in | Wt 221.0 lb

## 2015-02-16 DIAGNOSIS — Z0000 Encounter for general adult medical examination without abnormal findings: Secondary | ICD-10-CM

## 2015-02-16 DIAGNOSIS — Z23 Encounter for immunization: Secondary | ICD-10-CM | POA: Diagnosis not present

## 2015-02-16 MED ORDER — LISINOPRIL-HYDROCHLOROTHIAZIDE 20-12.5 MG PO TABS: 1.0000 | ORAL_TABLET | Freq: Every day | ORAL | Status: DC

## 2015-02-16 NOTE — Progress Notes (Signed)
Pre visit review using our clinic review tool, if applicable. No additional management support is needed unless otherwise documented below in the visit note. 

## 2015-02-17 ENCOUNTER — Encounter: Payer: Self-pay | Admitting: Family Medicine

## 2015-02-17 ENCOUNTER — Encounter (INDEPENDENT_AMBULATORY_CARE_PROVIDER_SITE_OTHER): Payer: BLUE CROSS/BLUE SHIELD | Admitting: Ophthalmology

## 2015-02-17 DIAGNOSIS — H35033 Hypertensive retinopathy, bilateral: Secondary | ICD-10-CM | POA: Diagnosis not present

## 2015-02-17 DIAGNOSIS — H43812 Vitreous degeneration, left eye: Secondary | ICD-10-CM

## 2015-02-17 DIAGNOSIS — I10 Essential (primary) hypertension: Secondary | ICD-10-CM

## 2015-02-17 DIAGNOSIS — H338 Other retinal detachments: Secondary | ICD-10-CM | POA: Diagnosis not present

## 2015-02-17 NOTE — Progress Notes (Signed)
   Subjective:    Patient ID: CATCHER DEHOYOS, male    DOB: 08/30/1955, 59 y.o.   MRN: [id number]  HPI 59 yr old male for a cpx. He feels well except for right knee pain. He has a meniscal tear and he is scheduled for an arthroscopy next week on this per Dr. Theda Sers.    Review of Systems  Constitutional: Negative.   HENT: Negative.   Eyes: Negative.   Respiratory: Negative.   Cardiovascular: Negative.   Gastrointestinal: Negative.   Genitourinary: Negative.   Musculoskeletal: Positive for arthralgias. Negative for myalgias, back pain, joint swelling, gait problem, neck pain and neck stiffness.  Skin: Negative.   Neurological: Negative.   Psychiatric/Behavioral: Negative.        Objective:   Physical Exam  Constitutional: He is oriented to person, place, and time. He appears well-developed and well-nourished. No distress.  HENT:  Head: Normocephalic and atraumatic.  Right Ear: External ear normal.  Left Ear: External ear normal.  Nose: Nose normal.  Mouth/Throat: Oropharynx is clear and moist. No oropharyngeal exudate.  Eyes: Conjunctivae and EOM are normal. Pupils are equal, round, and reactive to light. Right eye exhibits no discharge. Left eye exhibits no discharge. No scleral icterus.  Neck: Neck supple. No JVD present. No tracheal deviation present. No thyromegaly present.  Cardiovascular: Normal rate, regular rhythm, normal heart sounds and intact distal pulses.  Exam reveals no gallop and no friction rub.   No murmur heard. Pulmonary/Chest: Effort normal and breath sounds normal. No respiratory distress. He has no wheezes. He has no rales. He exhibits no tenderness.  Abdominal: Soft. Bowel sounds are normal. He exhibits no distension and no mass. There is no tenderness. There is no rebound and no guarding.  Genitourinary: Rectum normal, prostate normal and penis normal. Guaiac negative stool. No penile tenderness.  Musculoskeletal: Normal range of motion. He exhibits no  edema or tenderness.  Lymphadenopathy:    He has no cervical adenopathy.  Neurological: He is alert and oriented to person, place, and time. He has normal reflexes. No cranial nerve deficit. He exhibits normal muscle tone. Coordination normal.  Skin: Skin is warm and dry. No rash noted. He is not diaphoretic. No erythema. No pallor.  The left ear lobe has a firm pearly nodular area with a central ulcerated spot   Psychiatric: He has a normal mood and affect. His behavior is normal. Judgment and thought content normal.          Assessment & Plan:  Well exam. We discussed diet and exercise. The lesion on the left ear may be a large basal cell cancer, so I advised him to see his Dermatologist, Dr. Durward Fortes, about this soon.

## 2015-04-14 ENCOUNTER — Encounter (HOSPITAL_COMMUNITY): Payer: Self-pay | Admitting: Nurse Practitioner

## 2015-04-14 ENCOUNTER — Emergency Department (HOSPITAL_COMMUNITY): Payer: BLUE CROSS/BLUE SHIELD

## 2015-04-14 ENCOUNTER — Emergency Department (HOSPITAL_COMMUNITY)
Admission: EM | Admit: 2015-04-14 | Discharge: 2015-04-14 | Disposition: A | Discharge status: Home / Self Care | Payer: BLUE CROSS/BLUE SHIELD | Attending: Physician Assistant | Admitting: Physician Assistant

## 2015-04-14 DIAGNOSIS — Z8547 Personal history of malignant neoplasm of testis: Secondary | ICD-10-CM | POA: Diagnosis not present

## 2015-04-14 DIAGNOSIS — Z87442 Personal history of urinary calculi: Secondary | ICD-10-CM | POA: Insufficient documentation

## 2015-04-14 DIAGNOSIS — Z79899 Other long term (current) drug therapy: Secondary | ICD-10-CM | POA: Insufficient documentation

## 2015-04-14 DIAGNOSIS — Z8719 Personal history of other diseases of the digestive system: Secondary | ICD-10-CM | POA: Diagnosis not present

## 2015-04-14 DIAGNOSIS — R531 Weakness: Secondary | ICD-10-CM | POA: Diagnosis not present

## 2015-04-14 DIAGNOSIS — I10 Essential (primary) hypertension: Secondary | ICD-10-CM | POA: Insufficient documentation

## 2015-04-14 DIAGNOSIS — Z86010 Personal history of colonic polyps: Secondary | ICD-10-CM | POA: Diagnosis not present

## 2015-04-14 DIAGNOSIS — R55 Syncope and collapse: Secondary | ICD-10-CM | POA: Diagnosis present

## 2015-04-14 DIAGNOSIS — R42 Dizziness and giddiness: Secondary | ICD-10-CM | POA: Insufficient documentation

## 2015-04-14 DIAGNOSIS — Z8781 Personal history of (healed) traumatic fracture: Secondary | ICD-10-CM | POA: Insufficient documentation

## 2015-04-14 LAB — COMPREHENSIVE METABOLIC PANEL
ALT: 18 U/L (ref 17–63)
ANION GAP: 11 (ref 5–15)
AST: 25 U/L (ref 15–41)
Albumin: 3.9 g/dL (ref 3.5–5.0)
Alkaline Phosphatase: 54 U/L (ref 38–126)
BILIRUBIN TOTAL: 1 mg/dL (ref 0.3–1.2)
BUN: 24 mg/dL — AB (ref 6–20)
CALCIUM: 9 mg/dL (ref 8.9–10.3)
CHLORIDE: 107 mmol/L (ref 101–111)
CO2: 24 mmol/L (ref 22–32)
CREATININE: 1.04 mg/dL (ref 0.61–1.24)
Glucose, Bld: 101 mg/dL — ABNORMAL HIGH (ref 65–99)
Potassium: 4.3 mmol/L (ref 3.5–5.1)
Sodium: 142 mmol/L (ref 135–145)
TOTAL PROTEIN: 6.4 g/dL — AB (ref 6.5–8.1)

## 2015-04-14 LAB — CBC WITH DIFFERENTIAL/PLATELET
BASOS PCT: 0 %
Basophils Absolute: 0 10*3/uL (ref 0.0–0.1)
EOS ABS: 0 10*3/uL (ref 0.0–0.7)
EOS PCT: 0 %
HCT: 47.2 % (ref 39.0–52.0)
Hemoglobin: 16.1 g/dL (ref 13.0–17.0)
LYMPHS ABS: 0.3 10*3/uL — AB (ref 0.7–4.0)
Lymphocytes Relative: 3 %
MCH: 28.8 pg (ref 26.0–34.0)
MCHC: 34.1 g/dL (ref 30.0–36.0)
MCV: 84.4 fL (ref 78.0–100.0)
Monocytes Absolute: 0.3 10*3/uL (ref 0.1–1.0)
Monocytes Relative: 3 %
Neutro Abs: 9.7 10*3/uL — ABNORMAL HIGH (ref 1.7–7.7)
Neutrophils Relative %: 94 %
PLATELETS: 121 10*3/uL — AB (ref 150–400)
RBC: 5.59 MIL/uL (ref 4.22–5.81)
RDW: 12.5 % (ref 11.5–15.5)
WBC: 10.4 10*3/uL (ref 4.0–10.5)

## 2015-04-14 LAB — I-STAT TROPONIN, ED: TROPONIN I, POC: 0 ng/mL (ref 0.00–0.08)

## 2015-04-14 MED ORDER — SODIUM CHLORIDE 0.9 % IV BOLUS (SEPSIS)
1000.0000 mL | Freq: Once | INTRAVENOUS | Status: AC
  Administered 2015-04-14: 1000 mL via INTRAVENOUS

## 2015-04-14 NOTE — ED Provider Notes (Addendum)
CSN: [id number]     Arrival date & time 04/14/15  1020 History   First MD Initiated Contact with Patient 04/14/15 1045     Chief Complaint  Patient presents with  . Loss of Consciousness     (Consider location/radiation/quality/duration/timing/severity/associated sxs/prior Treatment) HPI    Patient is a 60-year-old male past medical history of hypertension hyperlipidemia and history of multiple vasovagal syncopal episodes. Today patient was going to get Moh's surgery done. He got up as usual it breakfast and coffee and went to his appointment. Patient started feeling weak and lightheaded before going in for the local anesthesia. After received local anesthesia he needed to lie down. Patient felt very presyncopal and then did syncopized. Dermatology office have him come here for evaluation.  Patient had this multiple times in the past. Patient had a on a flight 10 years ago. He's had it 3 or 4 times in the last year at home. He reports is often associated with abdominal pain or painful stimuli. He's had extensive workup done by his primary care physician for it. He is always aware of when the syncope will happen. He reports today is no different than usual. He's had no recent shortness of breath, chest pain or concerning symptoms.  Past Medical History  Diagnosis Date  . Hypertension   . Hyperlipidemia   . Hx of colonic polyps     pt unsure,thinks this was done in 1982 in [place]. no reports in EPIC  . Syncopal episodes 2008  . Ankle fracture   . Patella fracture   . Cancer [facility]) 1974    testicular cancer-at age 28  . Kidney stones   . GERD (gastroesophageal reflux disease)     in the past  . Retinal tear of right eye    Past Surgical History  Procedure Laterality Date  . Knee arthroscopy Right     x 2  . Hydrocele excision    . Orchiectomy      right testicle  . Colonoscopy  10-21-12    per Dr. Sharlett Iles, adenomatous polyp, repeat in 3 yrs   . Tonsillectomy    . Vitrectomy Right  06/16/2014  . Laser photo ablation Right 06/16/2014    Procedure: LASER PHOTO ABLATION;  Surgeon: Hayden Pedro, MD;  Location: [place];  Service: Ophthalmology;  Laterality: Right;  Headscope laser and endolaser   . Air/fluid exchange Right 06/16/2014    Procedure: AIR/FLUID EXCHANGE;  Surgeon: Hayden Pedro, MD;  Location: [place];  Service: Ophthalmology;  Laterality: Right;  . Gas insertion Right 06/16/2014    Procedure: INSERTION OF GAS;  Surgeon: Hayden Pedro, MD;  Location: [place];  Service: Ophthalmology;  Laterality: Right;  C3F8  . Repair of complex traction retinal detachment Right 06/16/2014    Procedure: REPAIR OF COMPLEX TRACTION RETINAL DETACHMENT;  Surgeon: Hayden Pedro, MD;  Location: [place];  Service: Ophthalmology;  Laterality: Right;  . Cataract extraction, bilateral  2016    per Dr. Herbert Deaner    Family History  Problem Relation Age of Onset  . Coronary artery disease      fhx  . Hypertension      fhx  . Sudden death      fhx  . Colon cancer Neg Hx   . Parkinson's disease Mother   . Arrhythmia Father    Social History  Substance Use Topics  . Smoking status: Never Smoker   . Smokeless tobacco: Never Used  . Alcohol Use: No  Review of Systems  Constitutional: Negative for activity change.  HENT: Negative for congestion.   Respiratory: Negative for shortness of breath.   Cardiovascular: Negative for chest pain.  Gastrointestinal: Positive for nausea. Negative for abdominal pain.  Allergic/Immunologic: Negative for immunocompromised state.  Neurological: Positive for dizziness, syncope, weakness and light-headedness.  Psychiatric/Behavioral: Negative for agitation.      Allergies  Review of patient's allergies indicates no known allergies.  Home Medications   Prior to Admission medications   Medication Sig Start Date End Date Taking? Authorizing Provider  bacitracin-polymyxin b (POLYSPORIN) ophthalmic ointment Place 1 application into the right eye 3  (three) times daily. apply to eye every 8 hours while awake Patient not taking: Reported on 02/16/2015 06/17/14   Hayden Pedro, MD  fish oil-omega-3 fatty acids 1000 MG capsule Take 1 g by mouth daily.     Historical Provider, MD  gatifloxacin (ZYMAXID) 0.5 % SOLN Place 1 drop into the right eye 4 (four) times daily. Patient not taking: Reported on 02/16/2015 06/17/14   Hayden Pedro, MD  lisinopril-hydrochlorothiazide (PRINZIDE,ZESTORETIC) 20-12.5 MG tablet Take 1 tablet by mouth daily. 02/16/15   Laurey Morale, MD  Multiple Vitamins-Minerals (ONE-A-DAY MENS HEALTH FORMULA PO) Take 1 tablet by mouth daily.    Historical Provider, MD  prednisoLONE acetate (PRED FORTE) 1 % ophthalmic suspension Place 1 drop into the right eye 4 (four) times daily. Patient not taking: Reported on 02/16/2015 06/17/14   Hayden Pedro, MD  triamcinolone cream (KENALOG) 0.1 % Apply 1 application topically 3 (three) times daily. Patient not taking: Reported on 06/16/2014 06/09/13   Laurey Morale, MD  vitamin B-12 (CYANOCOBALAMIN) 1000 MCG tablet Take 1,000 mcg by mouth daily. Takes as needed    Historical Provider, MD   BP 132/69 mmHg  Pulse 79  Temp(Src) 98 F (36.7 C) (Oral)  Resp 14  Ht 5\' 11"  (1.803 m)  Wt 220 lb (99.791 kg)  BMI 30.70 kg/m2  SpO2 100% Physical Exam  Constitutional: He is oriented to person, place, and time. He appears well-nourished.  Mildly pale.  HENT:  Head: Normocephalic.  Mouth/Throat: Oropharynx is clear and moist.  Dry Mucous membranes.  Eyes: Conjunctivae are normal.  Neck: No tracheal deviation present.  Cardiovascular: Normal rate.   Pulmonary/Chest: Effort normal. No stridor. No respiratory distress.  Abdominal: Soft. There is no tenderness. There is no guarding.  Musculoskeletal: Normal range of motion. He exhibits no edema.  Neurological: He is oriented to person, place, and time. No cranial nerve deficit.  Skin: Skin is warm and dry. No rash noted. He is not diaphoretic.   Psychiatric: He has a normal mood and affect. His behavior is normal.  Nursing note and vitals reviewed.   ED Course  Procedures (including critical care time) Labs Review Labs Reviewed  COMPREHENSIVE METABOLIC PANEL  CBC WITH DIFFERENTIAL/PLATELET  Randolm Idol, ED    Imaging Review Dg Chest 2 View  04/14/2015  CLINICAL DATA:  Syncope. Nausea. Possible vagal episode. Chest pain. EXAM: CHEST  2 VIEW COMPARISON:  Report from 08/16/2013 FINDINGS: Mild atherosclerotic calcification of the aortic arch. Thoracic spondylosis. The lungs appear clear. Heart size unremarkable. No pleural effusion. IMPRESSION: 1. No acute cardiopulmonary findings. 2. Atherosclerotic aortic arch. 3. Thoracic spondylosis. Electronically Signed   By: Van Clines M.D.   On: 04/14/2015 12:24   I have personally reviewed and evaluated these images and lab results as part of my medical decision-making.   EKG Interpretation   Date/Time:  Wednesday April 14 2015 10:21:59 EST Ventricular Rate:  80 PR Interval:  179 QRS Duration: 117 QT Interval:  371 QTC Calculation: 428 R Axis:   58 Text Interpretation:  Sinus rhythm Incomplete right bundle branch block No  significant change since last tracing Confirmed by Gerald Leitz  [phone]) on 04/14/2015 11:04:44 AM      MDM   Final diagnoses:  None   patient is a 50-year-old male with history of multiple vasovagal symptoms in the past. He reports today after vasovagal syncope. This happened while getting a Mohs procedure done at the dermatology office. Patient had presyncope and when he felt lightheaded nausea and Like he was going to pass out. For this reason I believe that this syncopal episode today is consistent with vasovagal. He has extensive history of having this worked up in the past. We will do initial labs, troponin, EKG, chest x-ray. We will have patient follow-up with his primary care physician and potentially follow up with [facility] neurology  for further workup as needed.  12:46 PM Patient ambulatory without issue. Feels improved. Negative labs chest x-ray and EKG. We'll have patient follow up with primary care physician.  Haddie Bruhl Julio Alm, MD 04/14/15 [place], MD 04/14/15 1247

## 2015-04-14 NOTE — ED Notes (Signed)
Patient ambulated to restroom with steady gait.

## 2015-04-14 NOTE — Discharge Instructions (Signed)
We want you to follow up with your regular physician for your vasovagal syncope.   Syncope Syncope is a medical term for fainting or passing out. This means you lose consciousness and drop to the ground. People are generally unconscious for less than 5 minutes. You may have some muscle twitches for up to 15 seconds before waking up and returning to normal. Syncope occurs more often in older adults, but it can happen to anyone. While most causes of syncope are not dangerous, syncope can be a sign of a serious medical problem. It is important to seek medical care.  CAUSES  Syncope is caused by a sudden drop in blood flow to the brain. The specific cause is often not determined. Factors that can bring on syncope include:  Taking medicines that lower blood pressure.  Sudden changes in posture, such as standing up quickly.  Taking more medicine than prescribed.  Standing in one place for too long.  Seizure disorders.  Dehydration and excessive exposure to heat.  Low blood sugar (hypoglycemia).  Straining to have a bowel movement.  Heart disease, irregular heartbeat, or other circulatory problems.  Fear, emotional distress, seeing blood, or severe pain. SYMPTOMS  Right before fainting, you may:  Feel dizzy or light-headed.  Feel nauseous.  See all white or all black in your field of vision.  Have cold, clammy skin. DIAGNOSIS  Your health care provider will ask about your symptoms, perform a physical exam, and perform an electrocardiogram (ECG) to record the electrical activity of your heart. Your health care provider may also perform other heart or blood tests to determine the cause of your syncope which may include:  Transthoracic echocardiogram (TTE). During echocardiography, sound waves are used to evaluate how blood flows through your heart.  Transesophageal echocardiogram (TEE).  Cardiac monitoring. This allows your health care provider to monitor your heart rate and rhythm  in real time.  Holter monitor. This is a portable device that records your heartbeat and can help diagnose heart arrhythmias. It allows your health care provider to track your heart activity for several days, if needed.  Stress tests by exercise or by giving medicine that makes the heart beat faster. TREATMENT  In most cases, no treatment is needed. Depending on the cause of your syncope, your health care provider may recommend changing or stopping some of your medicines. HOME CARE INSTRUCTIONS  Have someone stay with you until you feel stable.  Do not drive, use machinery, or play sports until your health care provider says it is okay.  Keep all follow-up appointments as directed by your health care provider.  Lie down right away if you start feeling like you might faint. Breathe deeply and steadily. Wait until all the symptoms have passed.  Drink enough fluids to keep your urine clear or pale yellow.  If you are taking blood pressure or heart medicine, get up slowly and take several minutes to sit and then stand. This can reduce dizziness. SEEK IMMEDIATE MEDICAL CARE IF:   You have a severe headache.  You have unusual pain in the chest, abdomen, or back.  You are bleeding from your mouth or rectum, or you have black or tarry stool.  You have an irregular or very fast heartbeat.  You have pain with breathing.  You have repeated fainting or seizure-like jerking during an episode.  You faint when sitting or lying down.  You have confusion.  You have trouble walking.  You have severe weakness.  You have  vision problems. If you fainted, call your local emergency services (911 in [place] Do not drive yourself to the hospital.    This information is not intended to replace advice given to you by your health care provider. Make sure you discuss any questions you have with your health care provider.   Document Released: 03/27/2005 Document Revised: 08/11/2014 Document Reviewed:  05/26/2011 Elsevier Interactive Patient Education [employer].

## 2015-04-14 NOTE — ED Notes (Signed)
Per EMS pt was at dermatologist waiting room for procedure and felt near syncopal and sat down in chair. Nurse at office witnessed to syncopal episodes in chair, patient endorses feeling lightheaded and nauseated prior to syncopal episode. Patient endorses having syncopal episodes and seen and informed it was vagal response. Denies any complaints at present time. Skin cold and clammy. Radial pulese 2+

## 2015-08-09 ENCOUNTER — Encounter: Payer: Self-pay | Admitting: Internal Medicine

## 2015-09-22 ENCOUNTER — Ambulatory Visit (INDEPENDENT_AMBULATORY_CARE_PROVIDER_SITE_OTHER): Payer: BLUE CROSS/BLUE SHIELD | Admitting: Ophthalmology

## 2016-02-16 ENCOUNTER — Telehealth: Payer: Self-pay | Admitting: Family Medicine

## 2016-02-16 DIAGNOSIS — Z0000 Encounter for general adult medical examination without abnormal findings: Secondary | ICD-10-CM

## 2016-02-16 NOTE — Telephone Encounter (Signed)
Pt had colonoscopy 10/2012 and instructed to repeat in one to 2 years.   Pt states he really needs to get before the end of the year due to insurance and the fact he should have already had. Will you put in referral for the repeat colonoscopy to Dr Patterson/ [facility]?

## 2016-02-16 NOTE — Telephone Encounter (Signed)
Pt will need to call that office to schedule, should not need a referral.

## 2016-02-18 NOTE — Telephone Encounter (Signed)
Pt will need referral to GI, it has been over 2 years since his last visit.

## 2016-02-18 NOTE — Telephone Encounter (Signed)
I left a voice message for pt with below information.  

## 2016-02-21 ENCOUNTER — Ambulatory Visit (INDEPENDENT_AMBULATORY_CARE_PROVIDER_SITE_OTHER): Payer: BLUE CROSS/BLUE SHIELD | Admitting: Ophthalmology

## 2016-02-21 ENCOUNTER — Telehealth: Payer: Self-pay | Admitting: Family Medicine

## 2016-02-21 NOTE — Telephone Encounter (Signed)
Pt would like to switch to Dr. Nani Ravens from Dr. Sarajane Jews. Pt says that he has a hard time with current PCP's office when it comes to reaching someone. He says that he cant get through. He also says that that [facility] office is closer for him in travel.    Please advise is this switch okay?   CB: [phone]

## 2016-02-21 NOTE — Telephone Encounter (Signed)
OK 

## 2016-02-22 NOTE — Telephone Encounter (Signed)
Yes that is okay with me. 

## 2016-02-22 NOTE — Telephone Encounter (Signed)
Referral was done  

## 2016-02-23 ENCOUNTER — Other Ambulatory Visit: Payer: Self-pay | Admitting: Family Medicine

## 2016-02-23 ENCOUNTER — Encounter: Payer: Self-pay | Admitting: Internal Medicine

## 2016-02-23 NOTE — Telephone Encounter (Signed)
I spoke with pt and gave below message.  

## 2016-02-25 NOTE — Telephone Encounter (Signed)
Pt has been scheduled.  °

## 2016-03-01 ENCOUNTER — Ambulatory Visit (INDEPENDENT_AMBULATORY_CARE_PROVIDER_SITE_OTHER): Payer: BLUE CROSS/BLUE SHIELD | Admitting: Family Medicine

## 2016-03-01 ENCOUNTER — Encounter: Payer: Self-pay | Admitting: Family Medicine

## 2016-03-01 VITALS — BP 124/80 | HR 64 | Temp 98.3°F | Ht 71.0 in | Wt 227.2 lb

## 2016-03-01 DIAGNOSIS — Z23 Encounter for immunization: Secondary | ICD-10-CM

## 2016-03-01 DIAGNOSIS — Z0000 Encounter for general adult medical examination without abnormal findings: Secondary | ICD-10-CM | POA: Diagnosis not present

## 2016-03-01 MED ORDER — LISINOPRIL-HYDROCHLOROTHIAZIDE 20-12.5 MG PO TABS: 1.0000 | ORAL_TABLET | Freq: Every day | ORAL | 3 refills | Status: DC

## 2016-03-01 NOTE — Progress Notes (Signed)
Pre visit review using our clinic review tool, if applicable. No additional management support is needed unless otherwise documented below in the visit note. 

## 2016-03-01 NOTE — Progress Notes (Signed)
Chief Complaint  Patient presents with  . Annual Exam    Has a Colonoscopy schedlued for December 2017.    Well Male [name] [name] is here for a complete physical.   His last physical was >1 year ago.  Current diet: in general, a "healthy" diet   Current exercise: Tennis 2-3 times weekly  Weight trend: stable Does pt snore? No.  Daytime fatigue? No. Seat belt? Yes.    Concerns: None  Past Medical History:  Diagnosis Date  . Ankle fracture   . Cancer [facility]) 1974   testicular cancer-at age 36  . GERD (gastroesophageal reflux disease)    in the past  . Hx of colonic polyps    pt unsure,thinks this was done in 1982 in [place]. no reports in EPIC  . Hyperlipidemia   . Hypertension   . Kidney stones   . Patella fracture   . Retinal tear of right eye   . Syncopal episodes 2008    Past Surgical History:  Procedure Laterality Date  . AIR/FLUID EXCHANGE Right 06/16/2014   Procedure: AIR/FLUID EXCHANGE;  Surgeon: Hayden Pedro, MD;  Location: [place];  Service: Ophthalmology;  Laterality: Right;  . CATARACT EXTRACTION, BILATERAL  2016   per Dr. Herbert Deaner   . COLONOSCOPY  10-21-12   per Dr. Sharlett Iles, adenomatous polyp, repeat in 3 yrs   . GAS INSERTION Right 06/16/2014   Procedure: INSERTION OF GAS;  Surgeon: Hayden Pedro, MD;  Location: [place];  Service: Ophthalmology;  Laterality: Right;  C3F8  . HYDROCELE EXCISION    . KNEE ARTHROSCOPY Right    x 2  . LASER PHOTO ABLATION Right 06/16/2014   Procedure: LASER PHOTO ABLATION;  Surgeon: Hayden Pedro, MD;  Location: [place];  Service: Ophthalmology;  Laterality: Right;  Headscope laser and endolaser   . ORCHIECTOMY     right testicle  . REPAIR OF COMPLEX TRACTION RETINAL DETACHMENT Right 06/16/2014   Procedure: REPAIR OF COMPLEX TRACTION RETINAL DETACHMENT;  Surgeon: Hayden Pedro, MD;  Location: [place];  Service: Ophthalmology;  Laterality: Right;  . TONSILLECTOMY    . VITRECTOMY Right 06/16/2014   Medications  Current Outpatient  Prescriptions on File Prior to Visit  Medication Sig Dispense Refill  . lisinopril-hydrochlorothiazide (PRINZIDE,ZESTORETIC) 20-12.5 MG tablet Take 1 tablet by mouth daily. 90 tablet 3   Allergies No Known Allergies Family History Family History  Problem Relation Age of Onset  . Coronary artery disease      fhx  . Hypertension      fhx  . Sudden death      fhx  . Colon cancer Neg Hx   . Parkinson's disease Mother   . Arrhythmia Father     Review of Systems: Constitutional:  no unexpected change in weight, no fevers or chills Eye:  no recent significant change in vision Ear/Nose/Mouth/Throat:  Ears:  no tinnitus or hearing loss Nose/Mouth/Throat:  no complaints of nasal congestion or bleeding, no sore throat and oral sores Cardiovascular:  no chest pain, no palpitations Respiratory:  no cough and no shortness of breath Gastrointestinal:  no abdominal pain, no change in bowel habits, no nausea, vomiting, diarrhea, or constipation and no black or bloody stool GU:  Male: negative for dysuria, frequency, and incontinence and negative for prostate symptoms Musculoskeletal/Extremities:  no pain, redness, or swelling of the joints Integumentary (Skin/Breast):  no abnormal skin lesions reported Neurologic:  no headaches, no numbness, tingling Endocrine:  weight changes, masses in the  neck, heat/cold intolerance, bowel or skin changes, or cardiovascular system symptoms Hematologic/Lymphatic:  no abnormal bleeding, no HIV risk factors, no night sweats, no swollen nodes, no weight loss  Exam BP 124/80 (BP Location: Left Arm, Patient Position: Sitting, Cuff Size: Large)   Pulse 64   Temp 98.3 F (36.8 C) (Oral)   Ht 5\' 11"  (1.803 m)   Wt 227 lb 3.2 oz (103.1 kg)   SpO2 100% Comment: RA  BMI 31.69 kg/m  General:  well developed, well nourished, in no apparent distress Skin:  no significant moles, warts, or growths Head:  no masses, lesions, or tenderness Eyes:  pupils equal and  round, sclera anicteric without injection Ears:  canals without lesions, TMs shiny without retraction, no obvious effusion, no erythema Nose:  nares patent, septum midline, mucosa normal Throat/Pharynx:  lips and gingiva without lesion; tongue and uvula midline; non-inflamed pharynx; no exudates or postnasal drainage Neck: neck supple without adenopathy, thyromegaly, or masses Lungs:  clear to auscultation, breath sounds equal bilaterally, no respiratory distress Cardio:  regular rate and rhythm without murmurs, heart sounds without clicks or rubs Abdomen:  abdomen soft, nontender; bowel sounds normal; no masses or organomegaly Genital (male): circumcised penis, no lesions or discharge; solitary L testicle without masses or tenderness Rectal: Deferred Musculoskeletal:  symmetrical muscle groups noted without atrophy or deformity Extremities:  no clubbing, cyanosis, or edema, no deformities, no skin discoloration Neuro:  gait normal; deep tendon reflexes normal and symmetric Psych: well oriented with normal range of affect and appropriate judgment/insight  Assessment and Plan  Well adult exam   Well 60 y.o. male. Counseled on diet and exercise. Start lifting weights. Immunizations, labs, and further orders as above. Follow up in 1 year pending the above workup. The patient voiced understanding and agreement to the plan.  [place], DO 03/01/16 11:31 AM

## 2016-03-13 ENCOUNTER — Other Ambulatory Visit: Payer: BLUE CROSS/BLUE SHIELD

## 2016-03-20 ENCOUNTER — Encounter: Payer: BLUE CROSS/BLUE SHIELD | Admitting: Family Medicine

## 2016-03-20 ENCOUNTER — Ambulatory Visit (AMBULATORY_SURGERY_CENTER): Payer: Self-pay

## 2016-03-20 ENCOUNTER — Ambulatory Visit: Payer: BLUE CROSS/BLUE SHIELD | Admitting: Family Medicine

## 2016-03-20 VITALS — Ht 71.0 in | Wt 232.0 lb

## 2016-03-20 DIAGNOSIS — Z860100 Personal history of colon polyps, unspecified: Secondary | ICD-10-CM

## 2016-03-20 MED ORDER — SUPREP BOWEL PREP KIT 17.5-3.13-1.6 GM/177ML PO SOLN: 1.0000 | Freq: Once | ORAL | 0 refills | Status: AC

## 2016-03-20 NOTE — Progress Notes (Signed)
No allergies to eggs or soy No past problems with anesthesia No diet meds No home oxygen  Declined emmi 

## 2016-03-21 ENCOUNTER — Encounter: Payer: Self-pay | Admitting: Internal Medicine

## 2016-03-28 ENCOUNTER — Encounter: Payer: Self-pay | Admitting: Internal Medicine

## 2016-03-28 ENCOUNTER — Ambulatory Visit (AMBULATORY_SURGERY_CENTER): Payer: BLUE CROSS/BLUE SHIELD | Admitting: Internal Medicine

## 2016-03-28 VITALS — BP 155/52 | HR 53 | Temp 98.2°F | Resp 15 | Ht 71.0 in | Wt 232.0 lb

## 2016-03-28 DIAGNOSIS — K635 Polyp of colon: Secondary | ICD-10-CM | POA: Diagnosis not present

## 2016-03-28 DIAGNOSIS — D123 Benign neoplasm of transverse colon: Secondary | ICD-10-CM

## 2016-03-28 DIAGNOSIS — Z86010 Personal history of colonic polyps: Secondary | ICD-10-CM

## 2016-03-28 DIAGNOSIS — D122 Benign neoplasm of ascending colon: Secondary | ICD-10-CM

## 2016-03-28 DIAGNOSIS — D125 Benign neoplasm of sigmoid colon: Secondary | ICD-10-CM

## 2016-03-28 DIAGNOSIS — D127 Benign neoplasm of rectosigmoid junction: Secondary | ICD-10-CM | POA: Diagnosis not present

## 2016-03-28 DIAGNOSIS — D124 Benign neoplasm of descending colon: Secondary | ICD-10-CM

## 2016-03-28 MED ORDER — SODIUM CHLORIDE 0.9 % IV SOLN
500.0000 mL | INTRAVENOUS | Status: DC
Start: 2016-03-28 — End: 2019-07-10

## 2016-03-28 NOTE — Op Note (Signed)
[place] Patient Name: [name] [name] Procedure Date: 03/28/2016 10:41 AM MRN: [id number] Endoscopist: Jerene Bears , MD Age: 61 Referring MD:  Date of Birth: 30-Aug-1955 Gender: Male Account #: [id number] Procedure:                Colonoscopy Indications:              Surveillance: Personal history of adenomatous                            polyps on last colonoscopy 3 years ago Medicines:                Monitored Anesthesia Care Procedure:                Pre-Anesthesia Assessment:                           - Prior to the procedure, a History and Physical                            was performed, and patient medications and                            allergies were reviewed. The patient's tolerance of                            previous anesthesia was also reviewed. The risks                            and benefits of the procedure and the sedation                            options and risks were discussed with the patient.                            All questions were answered, and informed consent                            was obtained. Prior Anticoagulants: The patient has                            taken no previous anticoagulant or antiplatelet                            agents. ASA Grade Assessment: II - A patient with                            mild systemic disease. After reviewing the risks                            and benefits, the patient was deemed in                            satisfactory condition to undergo the procedure.  After obtaining informed consent, the colonoscope                            was passed under direct vision. Throughout the                            procedure, the patient's blood pressure, pulse, and                            oxygen saturations were monitored continuously. The                            Model CF-HQ190L [id number]) scope was introduced                            through the anus and advanced  to the the cecum,                            identified by appendiceal orifice and ileocecal                            valve. The colonoscopy was performed without                            difficulty. The patient tolerated the procedure                            well. The quality of the bowel preparation was                            good. The ileocecal valve, appendiceal orifice, and                            rectum were photographed. The bowel preparation                            used was 2 day MiraLax + SuPrep. Scope In: 10:51:47 AM Scope Out: 11:27:34 AM Scope Withdrawal Time: 0 hours 31 minutes 7 seconds  Total Procedure Duration: 0 hours 35 minutes 47 seconds  Findings:                 The digital rectal exam was normal.                           A 3 mm polyp was found in the ascending colon. The                            polyp was sessile. The polyp was removed with a                            cold biopsy forceps. Resection and retrieval were                            complete.  A 7 mm polyp was found in the ascending colon. The                            polyp was semi-pedunculated. The polyp was removed                            with a cold snare. Resection and retrieval were                            complete.                           A 6 mm polyp was found in the transverse colon. The                            polyp was sessile. The polyp was removed with a                            cold snare. Resection and retrieval were complete.                           Two sessile polyps were found in the transverse                            colon. The polyps were 3 to 4 mm in size. These                            polyps were removed with a cold biopsy forceps.                            Resection and retrieval were complete.                           A 3 mm polyp was found in the descending colon. The                            polyp was  sessile. The polyp was removed with a                            cold biopsy forceps. Resection and retrieval were                            complete.                           Two sessile polyps were found in the sigmoid colon.                            The polyps were 4 to 5 mm in size. These polyps                            were removed with a cold snare. Resection and  retrieval were complete.                           A 5 mm polyp was found in the rectum. The polyp was                            sessile. The polyp was removed with a cold snare.                            Resection and retrieval were complete.                           Multiple small and large-mouthed diverticula were                            found in the sigmoid colon and distal descending                            colon. Erythema and edema in circumferential                            fashion was seen in association with the                            diverticular opening in the sigmoid colon.                           There was a medium-sized lipoma, 8 to 10 mm in                            diameter, in the descending colon and in the                            ascending colon.                           Internal hemorrhoids were found during                            retroflexion. The hemorrhoids were medium-sized. Complications:            No immediate complications. Estimated Blood Loss:     Estimated blood loss was minimal. Impression:               - One 3 mm polyp in the ascending colon, removed                            with a cold biopsy forceps. Resected and retrieved.                           - One 7 mm polyp in the ascending colon, removed                            with a cold snare. Resected and retrieved.                           -  One 6 mm polyp in the transverse colon, removed                            with a cold snare. Resected and retrieved.                            - Two 3 to 4 mm polyps in the transverse colon,                            removed with a cold biopsy forceps. Resected and                            retrieved.                           - One 3 mm polyp in the descending colon, removed                            with a cold biopsy forceps. Resected and retrieved.                           - Two 4 to 5 mm polyps in the sigmoid colon,                            removed with a cold snare. Resected and retrieved.                           - One 5 mm polyp in the rectum, removed with a cold                            snare. Resected and retrieved.                           - Mild diverticulosis in the sigmoid colon and in                            the distal descending colon.                           - Medium-sized lipoma in the descending colon and                            in the ascending colon.                           - Internal hemorrhoids. Recommendation:           - Patient has a contact number available for                            emergencies. The signs and symptoms of potential                            delayed complications were discussed  with the                            patient. Return to normal activities tomorrow.                            Written discharge instructions were provided to the                            patient.                           - Resume previous diet.                           - Continue present medications.                           - No ibuprofen, naproxen, or other non-steroidal                            anti-inflammatory drugs for 10 days after polyp                            removal.                           - Await pathology results.                           - Repeat colonoscopy is recommended for                            surveillance. The colonoscopy date will be                            determined after pathology results from today's                            exam  become available for review. Jerene Bears, MD 03/28/2016 11:37:58 AM This report has been signed electronically.

## 2016-03-28 NOTE — Progress Notes (Signed)
Called to room to assist during endoscopic procedure.  Patient ID and intended procedure confirmed with present staff. Received instructions for my participation in the procedure from the performing physician.  

## 2016-03-28 NOTE — Patient Instructions (Signed)
YOU HAD AN ENDOSCOPIC PROCEDURE TODAY AT [facility]:   Refer to the procedure report that was given to you for any specific questions about what was found during the examination.  If the procedure report does not answer your questions, please call your gastroenterologist to clarify.  If you requested that your care partner not be given the details of your procedure findings, then the procedure report has been included in a sealed envelope for you to review at your convenience later.  YOU SHOULD EXPECT: Some feelings of bloating in the abdomen. Passage of more gas than usual.  Walking can help get rid of the air that was put into your GI tract during the procedure and reduce the bloating. If you had a lower endoscopy (such as a colonoscopy or flexible sigmoidoscopy) you may notice spotting of blood in your stool or on the toilet paper. If you underwent a bowel prep for your procedure, you may not have a normal bowel movement for a few days.  Please Note:  You might notice some irritation and congestion in your nose or some drainage.  This is from the oxygen used during your procedure.  There is no need for concern and it should clear up in a day or so.  SYMPTOMS TO REPORT IMMEDIATELY:   Following lower endoscopy (colonoscopy or flexible sigmoidoscopy):  Excessive amounts of blood in the stool  Significant tenderness or worsening of abdominal pains  Swelling of the abdomen that is new, acute  Fever of 100F or higher  For urgent or emergent issues, a gastroenterologist can be reached at any hour by calling [phone].   DIET:  We do recommend a small meal at first, but then you may proceed to your regular diet.  Drink plenty of fluids but you should avoid alcoholic beverages for 24 hours.  ACTIVITY:  You should plan to take it easy for the rest of today and you should NOT DRIVE or use heavy machinery until tomorrow (because of the sedation medicines used during the test).     FOLLOW UP: Our staff will call the number listed on your records the next business day following your procedure to check on you and address any questions or concerns that you may have regarding the information given to you following your procedure. If we do not reach you, we will leave a message.  However, if you are feeling well and you are not experiencing any problems, there is no need to return our call.  We will assume that you have returned to your regular daily activities without incident.  If any biopsies were taken you will be contacted by phone or by letter within the next 1-3 weeks.  Please call us at [phone] if you have not heard about the biopsies in 3 weeks.  Polyps (handout given) Hemorrhoids (handout given) Diverticulosis (handout given)  Await for biopsy results to determined next repeat Colonoscopy No Ibuprofen, Naproxen, or other Non-steroidal anti-inflammatory drugs for next 10 days after polyps removal   SIGNATURES/CONFIDENTIALITY: You and/or your care partner have signed paperwork which will be entered into your electronic medical record.  These signatures attest to the fact that that the information above on your After Visit Summary has been reviewed and is understood.  Full responsibility of the confidentiality of this discharge information lies with you and/or your care-partner.

## 2016-03-29 ENCOUNTER — Telehealth: Payer: Self-pay | Admitting: *Deleted

## 2016-03-29 NOTE — Telephone Encounter (Signed)
  Follow up Call-  Call back number 03/28/2016  Post procedure Call Back phone  # [phone]  Permission to leave phone message Yes  Some recent data might be hidden     Patient questions:  Do you have a fever, pain , or abdominal swelling? No. Pain Score  0 *  Have you tolerated food without any problems? Yes.    Have you been able to return to your normal activities? Yes.    Do you have any questions about your discharge instructions: Diet   No. Medications  No. Follow up visit  No.  Do you have questions or concerns about your Care? No.  Actions: * If pain score is 4 or above: No action needed, pain <4.

## 2016-03-31 ENCOUNTER — Encounter: Payer: Self-pay | Admitting: Internal Medicine

## 2016-04-02 ENCOUNTER — Emergency Department (HOSPITAL_BASED_OUTPATIENT_CLINIC_OR_DEPARTMENT_OTHER)
Admission: EM | Admit: 2016-04-02 | Discharge: 2016-04-02 | Discharge status: Against Medical Advice | Payer: BLUE CROSS/BLUE SHIELD | Attending: Emergency Medicine | Admitting: Emergency Medicine

## 2016-04-02 ENCOUNTER — Encounter (HOSPITAL_BASED_OUTPATIENT_CLINIC_OR_DEPARTMENT_OTHER): Payer: Self-pay | Admitting: *Deleted

## 2016-04-02 ENCOUNTER — Telehealth (HOSPITAL_BASED_OUTPATIENT_CLINIC_OR_DEPARTMENT_OTHER): Payer: Self-pay | Admitting: *Deleted

## 2016-04-02 ENCOUNTER — Emergency Department (HOSPITAL_BASED_OUTPATIENT_CLINIC_OR_DEPARTMENT_OTHER): Payer: BLUE CROSS/BLUE SHIELD

## 2016-04-02 DIAGNOSIS — Z8547 Personal history of malignant neoplasm of testis: Secondary | ICD-10-CM | POA: Diagnosis not present

## 2016-04-02 DIAGNOSIS — M109 Gout, unspecified: Secondary | ICD-10-CM

## 2016-04-02 DIAGNOSIS — Z79899 Other long term (current) drug therapy: Secondary | ICD-10-CM | POA: Insufficient documentation

## 2016-04-02 DIAGNOSIS — M10071 Idiopathic gout, right ankle and foot: Secondary | ICD-10-CM | POA: Insufficient documentation

## 2016-04-02 DIAGNOSIS — M79671 Pain in right foot: Secondary | ICD-10-CM | POA: Diagnosis present

## 2016-04-02 DIAGNOSIS — I10 Essential (primary) hypertension: Secondary | ICD-10-CM | POA: Diagnosis not present

## 2016-04-02 MED ORDER — PREDNISONE 10 MG PO TABS
60.0000 mg | ORAL_TABLET | Freq: Once | ORAL | Status: DC
  Filled 2016-04-02: qty 1

## 2016-04-02 NOTE — ED Triage Notes (Addendum)
C/o R great toe (medial and anterior), onset Saturday morning, (denies: injury or wounds), minimal redness noted, skin intact, no obvious infection or heat, no h/o gout, (denies fever or sx other than pain), no meds PTA, rates 9/10. Ambulatory with limp. CMS/ ROM intact. Plays tennis 3x per week.

## 2016-04-02 NOTE — ED Notes (Signed)
Registration informed staff that pt walked out upset. Pt's cell phone immediately called. Upset about wait and lack of communication. Plan (meds and xray), process, wait and time-frame explained. Encouraged to return for meds, xray and Rxs. Pt declined. Asked for Rx to be called in to [facility].

## 2016-04-02 NOTE — ED Notes (Signed)
Xray at [facility], pt left explained.

## 2016-04-02 NOTE — ED Provider Notes (Signed)
[place] DEPT [facility] Provider Note   CSN: [id number] Arrival date & time: 04/02/16  0310     History   Chief Complaint Chief Complaint  Patient presents with  . Foot Pain    HPI [name] [name] is a 60 y.o. male.  HPI  This is a 60 year old male with a history of hypertension, hyperlipidemia, testicular cancer who presents with right great toe pain. Onset of symptoms last night. Denies trauma. Reports pain mostly with ambulation. States pain is 10 out of 10. Has not taken anything for pain. No history of gout. Denies alcohol use. Denies fevers or any other symptoms.  Past Medical History:  Diagnosis Date  . Ankle fracture   . Cancer [facility]) 1974   testicular cancer-at age 27  . GERD (gastroesophageal reflux disease)    in the past  . Hx of colonic polyps    pt unsure,thinks this was done in 1982 in [place]. no reports in EPIC  . Hyperlipidemia   . Hypertension   . Kidney stones   . Patella fracture   . Retinal tear of right eye   . Syncopal episodes 2008    Patient Active Problem List   Diagnosis Date Noted  . 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 Snoring 10/30/2012  . SKIN TAG 06/01/2008  . KNEE SPRAIN 06/01/2008  . HEMATURIA UNSPECIFIED 12/03/2007  . HYPERLIPIDEMIA 10/01/2007  . Essential hypertension 10/01/2007  . NEOPLASM, MALIGNANT, TESTES, HX OF 10/01/2007  . COLONIC POLYPS, HX OF 10/01/2007  . NEPHROLITHIASIS, HX OF 10/01/2007    Past Surgical History:  Procedure Laterality Date  . AIR/FLUID EXCHANGE Right 06/16/2014   Procedure: AIR/FLUID EXCHANGE;  Surgeon: Hayden Pedro, MD;  Location: [place];  Service: Ophthalmology;  Laterality: Right;  . CATARACT EXTRACTION, BILATERAL  2016   per Dr. Herbert Deaner   . COLONOSCOPY  10-21-12   per Dr. Sharlett Iles, adenomatous polyp, repeat in 3 yrs   . GAS INSERTION Right 06/16/2014   Procedure: INSERTION OF GAS;  Surgeon: Hayden Pedro, MD;  Location: [place];  Service: Ophthalmology;  Laterality: Right;   C3F8  . HYDROCELE EXCISION    . KNEE ARTHROSCOPY Right    x 2  . LASER PHOTO ABLATION Right 06/16/2014   Procedure: LASER PHOTO ABLATION;  Surgeon: Hayden Pedro, MD;  Location: [place];  Service: Ophthalmology;  Laterality: Right;  Headscope laser and endolaser   . ORCHIECTOMY     right testicle  . REPAIR OF COMPLEX TRACTION RETINAL DETACHMENT Right 06/16/2014   Procedure: REPAIR OF COMPLEX TRACTION RETINAL DETACHMENT;  Surgeon: Hayden Pedro, MD;  Location: [place];  Service: Ophthalmology;  Laterality: Right;  . TONSILLECTOMY    . VITRECTOMY Right 06/16/2014       Home Medications    Prior to Admission medications   Medication Sig Start Date End Date Taking? Authorizing Provider  lisinopril-hydrochlorothiazide (PRINZIDE,ZESTORETIC) 20-12.5 MG tablet Take 1 tablet by mouth daily. 03/01/16   Shelda Pal, DO    Family History Family History  Problem Relation Age of Onset  . Parkinson's disease Mother   . Arrhythmia Father   . Coronary artery disease      fhx  . Hypertension      fhx  . Sudden death      fhx  . Colon cancer Neg Hx     Social History Social History  Substance Use Topics  . Smoking status: Never Smoker  . Smokeless tobacco: Never Used  .  Alcohol use 0.0 oz/week     Comment: 2 beers yearly     Allergies   Patient has no known allergies.   Review of Systems Review of Systems  Constitutional: Negative for fever.  Musculoskeletal:       Right great toe pain  Skin: Positive for color change. Negative for wound.  All other systems reviewed and are negative.    Physical Exam Updated Vital Signs BP 172/90 (BP Location: Right Arm)   Pulse 63   Temp 97.7 F (36.5 C) (Oral)   Resp 18   Ht 5\' 11"  (1.803 m)   Wt 225 lb (102.1 kg)   SpO2 100%   BMI 31.38 kg/m   Physical Exam  Constitutional: He is oriented to person, place, and time. He appears well-developed and well-nourished. No distress.  HENT:  Head: Normocephalic and  atraumatic.  Cardiovascular: Normal rate, regular rhythm and normal heart sounds.   No murmur heard. Pulmonary/Chest: Effort normal and breath sounds normal. No respiratory distress. He has no wheezes.  Musculoskeletal:  Tenderness to palpation at the right MTP joint, mild swelling with warmth, no significant erythema, no deformity, 2+ DP pulse, normal active and passive range of motion  Neurological: He is alert and oriented to person, place, and time.  Skin: Skin is warm and dry.  Psychiatric: He has a normal mood and affect.  Nursing note and vitals reviewed.    ED Treatments / Results  Labs (all labs ordered are listed, but only abnormal results are displayed) Labs Reviewed - No data to display  EKG  EKG Interpretation None       Radiology No results found.  Procedures Procedures (including critical care time)  Medications Ordered in ED Medications  predniSONE (DELTASONE) tablet 60 mg (60 mg Oral Not Given 04/02/16 0410)     Initial Impression / Assessment and Plan / ED Course  I have reviewed the triage vital signs and the nursing notes.  Pertinent labs & imaging results that were available during my care of the patient were reviewed by me and considered in my medical decision making (see chart for details).  Clinical Course     Patient presents with right great toe pain. Onset last night. He is otherwise nontoxic. Denies infectious symptoms or injury. Clinical exam is most suspicious for gout. No significant predisposing factors. Discussed at length with the patient treatment. Will obtain x-ray to see if tophi visualized. Low suspicion at this time for septic joint.  4:10 AM  Informed by nursing that patient left. Unclear why. I have encouraged nursing to call patient. Per nursing, patient discouraged with wait time. He was here less than an hour. He was encouraged return for treatment and x-ray. However, he declines.  Given recent colonoscopy and polyp removal,  will call in prescription for prednisone as patient not a good candidate for anti-inflammatory medications.     Final Clinical Impressions(s) / ED Diagnoses   Final diagnoses:  Acute gout involving toe of right foot, unspecified cause    New Prescriptions New Prescriptions   No medications on file     Merryl Hacker, MD 04/02/16 [phone]

## 2016-04-14 ENCOUNTER — Encounter: Payer: BLUE CROSS/BLUE SHIELD | Admitting: Internal Medicine

## 2017-06-07 ENCOUNTER — Ambulatory Visit (INDEPENDENT_AMBULATORY_CARE_PROVIDER_SITE_OTHER): Payer: Self-pay | Admitting: Ophthalmology

## 2017-06-07 ENCOUNTER — Encounter (INDEPENDENT_AMBULATORY_CARE_PROVIDER_SITE_OTHER): Payer: Self-pay | Admitting: Ophthalmology

## 2017-06-07 DIAGNOSIS — H35351 Cystoid macular degeneration, right eye: Secondary | ICD-10-CM

## 2017-06-07 DIAGNOSIS — Z8669 Personal history of other diseases of the nervous system and sense organs: Secondary | ICD-10-CM

## 2017-06-07 DIAGNOSIS — H3581 Retinal edema: Secondary | ICD-10-CM

## 2017-06-07 DIAGNOSIS — H43812 Vitreous degeneration, left eye: Secondary | ICD-10-CM

## 2017-06-07 DIAGNOSIS — Z961 Presence of intraocular lens: Secondary | ICD-10-CM

## 2017-06-07 MED ORDER — KETOROLAC TROMETHAMINE 0.5 % OP SOLN: 1.0000 [drp] | Freq: Four times a day (QID) | OPHTHALMIC | 0 refills | Status: DC

## 2017-06-07 MED ORDER — PREDNISOLONE ACETATE 1 % OP SUSP: 1.0000 [drp] | Freq: Four times a day (QID) | OPHTHALMIC | 0 refills | Status: DC

## 2017-06-07 NOTE — Progress Notes (Signed)
[facility] Note  06/07/2017     CHIEF COMPLAINT Patient presents for Retina Evaluation   HISTORY OF PRESENT ILLNESS: [name] [name] is a 62 y.o. male who presents to the clinic today for:   HPI    Retina Evaluation    In both eyes.  This started 18 hours ago.  Associated Symptoms Blind Spot and Distortion.  Negative for Glare, Shoulder/Hip pain, Fatigue, Jaw Claudication, Photophobia, Floaters, Redness, Scalp Tenderness, Weight Loss, Fever, Trauma, Pain and Flashes.  Context:  distance vision, mid-range vision, near vision and watching TV.  Treatments tried include no treatments.  I, the attending physician,  performed the HPI with the patient and updated documentation appropriately.          Comments    Previous patient of DR. Matthews,Patient in for Retina eval. Possible RD OD. Patient states after he played tennis last night, he came home to watch Tv and noticed a large "glob" in visual field of right eye. Denies flashes,glare and ocular pain.        Last edited by Bernarda Caffey, MD on 06/07/2017 11:52 AM. (History)    Pt reports he called this morning with VA distortion; Pt states he was playing tennis yesterday and came home, he was seeing a "grey jagged blob" in [place]; Pt states it went on for 6 hours; Pt states when he woke this AM he no longer saw the "grey blob"; Pt denies any recent illness, denies any previous medical dx other than HTN;   Referring physician: Hayden Pedro, MD [place], [place] [address]  HISTORICAL INFORMATION:   Selected notes from the MEDICAL RECORD NUMBER Referred by Dr. Zigmund Daniel for possible RD OD;  LEE- 06.14.16 (JDM) [BCVA OD: 20/40+2 OS: 20/20-1] Ocular Hx- S/P laser break OD, HTN ret OU, S/P 23/25g PPV, laser, C3F8 OD (03.08.16 -JDM), pseudophakia OU PMH- HTN    CURRENT MEDICATIONS: Current Outpatient Medications (Ophthalmic Drugs)  Medication Sig  . ketorolac (ACULAR) 0.5 % ophthalmic  solution Place 1 drop into the right eye 4 (four) times daily.  . prednisoLONE acetate (PRED FORTE) 1 % ophthalmic suspension Place 1 drop into the right eye 4 (four) times daily.   No current facility-administered medications for this visit.  (Ophthalmic Drugs)   Current Outpatient Medications (Other)  Medication Sig  . lisinopril (PRINIVIL,ZESTRIL) 20 MG tablet Take by mouth.  Marland Kitchen lisinopril-hydrochlorothiazide (PRINZIDE,ZESTORETIC) 20-12.5 MG tablet Take 1 tablet by mouth daily. (Patient not taking: Reported on 06/07/2017)   Current Facility-Administered Medications (Other)  Medication Route  . 0.9 %  sodium chloride infusion Intravenous      REVIEW OF SYSTEMS: ROS    Positive for: Eyes   Negative for: Constitutional, Gastrointestinal, Neurological, Skin, Genitourinary, Musculoskeletal, HENT, Endocrine, Cardiovascular, Respiratory, Psychiatric, Allergic/Imm, Heme/Lymph   Last edited by Zenovia Jordan, LPN on 5/85/9292  4:46 AM. (History)       ALLERGIES No Known Allergies  PAST MEDICAL HISTORY Past Medical History:  Diagnosis Date  . Ankle fracture   . Cancer [facility]) 1974   testicular cancer-at age 23  . GERD (gastroesophageal reflux disease)    in the past  . Hx of colonic polyps    pt unsure,thinks this was done in 1982 in [place]. no reports in EPIC  . Hyperlipidemia   . Hypertension   . Kidney stones   . Patella fracture   . Retinal tear of right eye   . Syncopal episodes 2008  Past Surgical History:  Procedure Laterality Date  . AIR/FLUID EXCHANGE Right 06/16/2014   Procedure: AIR/FLUID EXCHANGE;  Surgeon: Hayden Pedro, MD;  Location: [place];  Service: Ophthalmology;  Laterality: Right;  . CATARACT EXTRACTION    . CATARACT EXTRACTION  09/2014   rt eye//left 01/2015  . CATARACT EXTRACTION, BILATERAL  2016   per Dr. Herbert Deaner   . COLONOSCOPY  10-21-12   per Dr. Sharlett Iles, adenomatous polyp, repeat in 3 yrs   . EYE SURGERY    . GAS INSERTION Right 06/16/2014    Procedure: INSERTION OF GAS;  Surgeon: Hayden Pedro, MD;  Location: [place];  Service: Ophthalmology;  Laterality: Right;  C3F8  . HYDROCELE EXCISION    . KNEE ARTHROSCOPY Right    x 2  . LASER PHOTO ABLATION Right 06/16/2014   Procedure: LASER PHOTO ABLATION;  Surgeon: Hayden Pedro, MD;  Location: [place];  Service: Ophthalmology;  Laterality: Right;  Headscope laser and endolaser   . ORCHIECTOMY     right testicle  . REPAIR OF COMPLEX TRACTION RETINAL DETACHMENT Right 06/16/2014   Procedure: REPAIR OF COMPLEX TRACTION RETINAL DETACHMENT;  Surgeon: Hayden Pedro, MD;  Location: [place];  Service: Ophthalmology;  Laterality: Right;  . RETINAL DETACHMENT SURGERY  06/2014  . TONSILLECTOMY    . VITRECTOMY Right 06/16/2014    FAMILY HISTORY Family History  Problem Relation Age of Onset  . Parkinson's disease Mother   . Arrhythmia Father   . Coronary artery disease Unknown        fhx  . Hypertension Unknown        fhx  . Sudden death Unknown        fhx  . Colon cancer Neg Hx     SOCIAL HISTORY Social History   Tobacco Use  . Smoking status: Never Smoker  . Smokeless tobacco: Never Used  Substance Use Topics  . Alcohol use: Yes    Alcohol/week: 0.0 oz    Comment: 2 beers yearly  . Drug use: No         OPHTHALMIC EXAM:  Base Eye Exam    Visual Acuity (Snellen - Linear)      Right Left   Dist [place] 20/30 -2 20/50   Dist ph [place] 20/25 -1 20/20       Tonometry (Tonopen, 9:00 AM)      Right Left   Pressure 14 10       Pupils      Dark Light Shape React APD   Right 5 4 Round Brisk None   Left 5 4 Round Brisk None       Visual Fields (Counting fingers)      Left Right    Full Full       Extraocular Movement      Right Left    Full, Ortho Full, Ortho       Neuro/Psych    Oriented x3:  Yes   Mood/Affect:  Normal       Dilation    Both eyes:  1.0% Mydriacyl, 2.5% Phenylephrine @ 9:00 AM        Slit Lamp and Fundus Exam    Slit Lamp Exam      Right Left    Lids/Lashes Dermatochalasis - upper lid, Telangiectasia, Meibomian gland dysfunction Dermatochalasis - upper lid, Telangiectasia, Meibomian gland dysfunction   Conjunctiva/Sclera White and quiet White and quiet   Cornea Clear, mild arcus Clear, mild arcus   Anterior Chamber Deep  and quiet, no cell or flare Deep and quiet, no cell or flare   Iris Round and dilated Round and dilated   Lens PC IOL in good position, clear PC PC IOL in good position   Vitreous Normal; clear; post viterctomy Vitreous syneresis, Posterior vitreous detachment       Fundus Exam      Right Left   Disc Normal Normal   C/D Ratio 0.3 0.2   Macula Blunted foveal reflex, mild Retinal pigment epithelial mottling, No heme  Good foveal reflex, Flat, No heme or edema   Vessels Tortuous Tortuous   Periphery Attached 360, 360 laser scarring, No RT/RD on 360 scleral depressed exam Cotton wool spot at 1130 2DD from disc        Refraction    Manifest Refraction      Sphere Cylinder Axis Dist VA   Right -3.25 +1.75 180 NI   Left -1.25 +1.50 180 NI          IMAGING AND PROCEDURES  Imaging and Procedures for 06/07/17  OCT, Retina - OU - Both Eyes     Right Eye Quality was good. Central Foveal Thickness: 334. Progression has no prior data. Findings include normal foveal contour, no SRF, intraretinal fluid.   Left Eye Quality was good. Central Foveal Thickness: 278. Progression has no prior data. Findings include normal foveal contour, no IRF, no SRF.   Notes *Images captured and stored on drive  Diagnosis / Impression:  OD: NFP with +IRF / cystic changes centrally; no SRF OS: NFP, No IRF/SRF  Clinical management:  See below  Abbreviations: NFP - Normal foveal profile. CME - cystoid macular edema. PED - pigment epithelial detachment. IRF - intraretinal fluid. SRF - subretinal fluid. EZ - ellipsoid zone. ERM - epiretinal membrane. ORA - outer retinal atrophy. ORT - outer retinal tubulation. SRHM - subretinal  hyper-reflective material         Fluorescein Angiography Optos (Transit OD)     Right Eye Early phase findings include normal observations. Mid/Late phase findings include leakage.   Left Eye Early phase findings include normal observations. Mid/Late phase findings include leakage.   Notes Impression:  OD: late petaloid leakage in fovea and patches of choroidal leakage w/ hyperfluorescence of the disc -- CME OD OS: late single focal peripapillary leakage                ASSESSMENT/PLAN:    ICD-10-CM   1. Cystoid macular edema of right eye H35.351 Fluorescein Angiography Optos (Transit OD)  2. Retinal edema H35.81 OCT, Retina - OU - Both Eyes    Fluorescein Angiography Optos (Transit OD)  3. Hx of retinal detachment Z86.69   4. Posterior vitreous detachment of left eye H43.812   5. Pseudophakia of both eyes Z96.1     1,2. CME OD - blunted foveal reflex - mild cystic changes on OCT - FA with mild late petaloid leakage in fovea OD - likely related to history of RD and pseudophakia -- surgeries back in 2016 - discussed findings, prognosis and treatment options - recommend topical therapy -- PF and ketorolac QID OD - f/u in 4-6 wks with DFE/OCT/possible repeat FA  3. History of RD repair OD-  - S/P PPV/laser by expert surgeon, Dr. Tempie Hoist (03.08.16) - retina attached 360 with good 360 laser in place - No RT/RD on 360 scleral depressed exam today - monitor  4. PVD / vitreous syneresis OS-  - Discussed findings and prognosis - No RT  or RD on 360 scleral depressed exam - Reviewed s/s of RT/RD - Strict return precautions for any such RT/RD signs/symptoms  5. Pseudophakia OU  - s/p CE/IOL OU  - beautiful surgery, doing well  - monitor   Ophthalmic Meds Ordered this visit:  Meds ordered this encounter  Medications  . prednisoLONE acetate (PRED FORTE) 1 % ophthalmic suspension    Sig: Place 1 drop into the right eye 4 (four) times daily.    Dispense:   10 mL    Refill:  0  . ketorolac (ACULAR) 0.5 % ophthalmic solution    Sig: Place 1 drop into the right eye 4 (four) times daily.    Dispense:  10 mL    Refill:  0       Return for 4-6 wks, Dilated Exam, OCT.  There are no Patient Instructions on file for this visit.   Explained the diagnoses, plan, and follow up with the patient and they expressed understanding.  Patient expressed understanding of the importance of proper follow up care.   This document serves as a record of services personally performed by Gardiner Sleeper, MD, PhD. It was created on their behalf by Catha Brow, [place], a certified ophthalmic assistant. The creation of this record is the provider's dictation and/or activities during the visit.  Electronically signed by: Catha Brow, COA  06/07/17 12:19 PM    Gardiner Sleeper, M.D., Ph.D. Diseases & Surgery of the Retina and [place] 06/07/17   I have reviewed the above documentation for accuracy and completeness, and I agree with the above. Gardiner Sleeper, M.D., Ph.D. 06/07/17 12:23 PM     Abbreviations: M myopia (nearsighted); A astigmatism; H hyperopia (farsighted); P presbyopia; Mrx spectacle prescription;  CTL contact lenses; OD right eye; OS left eye; OU both eyes  XT exotropia; ET esotropia; PEK punctate epithelial keratitis; PEE punctate epithelial erosions; DES dry eye syndrome; MGD meibomian gland dysfunction; ATs artificial tears; PFAT's preservative free artificial tears; [place] nuclear sclerotic cataract; PSC posterior subcapsular cataract; ERM epi-retinal membrane; PVD posterior vitreous detachment; RD retinal detachment; DM diabetes mellitus; DR diabetic retinopathy; NPDR non-proliferative diabetic retinopathy; PDR proliferative diabetic retinopathy; CSME clinically significant macular edema; DME diabetic macular edema; dbh dot blot hemorrhages; CWS cotton wool spot; POAG primary open angle glaucoma; C/D  cup-to-disc ratio; HVF humphrey visual field; GVF goldmann visual field; OCT optical coherence tomography; IOP intraocular pressure; BRVO Branch retinal vein occlusion; CRVO central retinal vein occlusion; CRAO central retinal artery occlusion; BRAO branch retinal artery occlusion; RT retinal tear; SB scleral buckle; PPV pars plana vitrectomy; VH Vitreous hemorrhage; PRP panretinal laser photocoagulation; IVK intravitreal kenalog; VMT vitreomacular traction; MH Macular hole;  NVD neovascularization of the disc; NVE neovascularization elsewhere; AREDS age related eye disease study; ARMD age related macular degeneration; POAG primary open angle glaucoma; EBMD epithelial/anterior basement membrane dystrophy; ACIOL anterior chamber intraocular lens; IOL intraocular lens; PCIOL posterior chamber intraocular lens; Phaco/IOL phacoemulsification with intraocular lens placement; [place] photorefractive keratectomy; LASIK laser assisted in situ keratomileusis; HTN hypertension; DM diabetes mellitus; COPD chronic obstructive pulmonary disease

## 2017-06-15 MED ORDER — DIFLUPREDNATE 0.05 % OP EMUL: 1.0000 [drp] | Freq: Four times a day (QID) | OPHTHALMIC | 0 refills | Status: DC

## 2017-06-15 NOTE — Progress Notes (Signed)
Note received from [facility] that PF on back order. Ordered Durezol QID OD to [employer] [facility] as replacement.  Gardiner Sleeper, M.D., Ph.D. Diseases & Surgery of the Retina and [place] 06/15/17

## 2017-06-15 NOTE — Addendum Note (Signed)
Addended by: Gardiner Sleeper on: 06/15/2017 08:58 AM   Modules accepted: Orders

## 2017-07-13 NOTE — Progress Notes (Signed)
[facility] Note  07/17/2017     CHIEF COMPLAINT Patient presents for Retina Follow Up   HISTORY OF PRESENT ILLNESS: [name] [name] is a 62 y.o. male who presents to the clinic today for:   HPI    Retina Follow Up    Patient presents with  Other.  In right eye.  This started 1 month ago.  Severity is mild.  Since onset it is stable.  I, the attending physician,  performed the HPI with the patient and updated documentation appropriately.          Comments    F/U CME OD. Patient states his right eye distortion  never came back(grey area) but, reading is difficult OS. Denies floaters, flashes and ocular pain. Pt does use Pred Forte gtt's.       Last edited by Bernarda Caffey, MD on 07/17/2017  9:01 AM. (History)    Pt states he feels OD VA is improved;   Referring physician: Shelda Pal, DO [address] [place] [address] [place], [place] [address]  HISTORICAL INFORMATION:   Selected notes from the MEDICAL RECORD NUMBER Referred by Dr. Zigmund Daniel for possible RD OD;  LEE- 06.14.16 (JDM) [BCVA OD: 20/40+2 OS: 20/20-1] Ocular Hx- S/P laser break OD, HTN ret OU, S/P 23/25g PPV, laser, C3F8 OD (03.08.16 -JDM), pseudophakia OU PMH- HTN    CURRENT MEDICATIONS: Current Outpatient Medications (Ophthalmic Drugs)  Medication Sig  . Difluprednate (DUREZOL) 0.05 % EMUL Place 1 drop into the right eye 4 (four) times daily.  Marland Kitchen ketorolac (ACULAR) 0.5 % ophthalmic solution Place 1 drop into the right eye 4 (four) times daily.  . prednisoLONE acetate (PRED FORTE) 1 % ophthalmic suspension Place 1 drop into the right eye 4 (four) times daily.   No current facility-administered medications for this visit.  (Ophthalmic Drugs)   Current Outpatient Medications (Other)  Medication Sig  . lisinopril (PRINIVIL,ZESTRIL) 20 MG tablet Take by mouth.  Marland Kitchen lisinopril-hydrochlorothiazide (PRINZIDE,ZESTORETIC) 20-12.5 MG tablet Take 1 tablet by mouth daily.   Current  Facility-Administered Medications (Other)  Medication Route  . 0.9 %  sodium chloride infusion Intravenous      REVIEW OF SYSTEMS: ROS    Positive for: Musculoskeletal   Negative for: Constitutional, Gastrointestinal, Neurological, Skin, Genitourinary, HENT, Endocrine, Cardiovascular, Eyes, Respiratory, Psychiatric, Allergic/Imm, Heme/Lymph   Last edited by Zenovia Jordan, LPN on 0/09/3014  0:10 AM. (History)       ALLERGIES No Known Allergies  PAST MEDICAL HISTORY Past Medical History:  Diagnosis Date  . Ankle fracture   . Cancer [facility]) 1974   testicular cancer-at age 60  . GERD (gastroesophageal reflux disease)    in the past  . Hx of colonic polyps    pt unsure,thinks this was done in 1982 in [place]. no reports in EPIC  . Hyperlipidemia   . Hypertension   . Kidney stones   . Patella fracture   . Retinal tear of right eye   . Syncopal episodes 2008   Past Surgical History:  Procedure Laterality Date  . AIR/FLUID EXCHANGE Right 06/16/2014   Procedure: AIR/FLUID EXCHANGE;  Surgeon: Hayden Pedro, MD;  Location: [place];  Service: Ophthalmology;  Laterality: Right;  . CATARACT EXTRACTION    . CATARACT EXTRACTION  09/2014   rt eye//left 01/2015  . CATARACT EXTRACTION, BILATERAL  2016   per Dr. Herbert Deaner   . COLONOSCOPY  10-21-12   per Dr. Sharlett Iles, adenomatous polyp, repeat in 3  yrs   . EYE SURGERY    . GAS INSERTION Right 06/16/2014   Procedure: INSERTION OF GAS;  Surgeon: Hayden Pedro, MD;  Location: [place];  Service: Ophthalmology;  Laterality: Right;  C3F8  . HYDROCELE EXCISION    . KNEE ARTHROSCOPY Right    x 2  . LASER PHOTO ABLATION Right 06/16/2014   Procedure: LASER PHOTO ABLATION;  Surgeon: Hayden Pedro, MD;  Location: [place];  Service: Ophthalmology;  Laterality: Right;  Headscope laser and endolaser   . ORCHIECTOMY     right testicle  . REPAIR OF COMPLEX TRACTION RETINAL DETACHMENT Right 06/16/2014   Procedure: REPAIR OF COMPLEX TRACTION RETINAL DETACHMENT;   Surgeon: Hayden Pedro, MD;  Location: [place];  Service: Ophthalmology;  Laterality: Right;  . RETINAL DETACHMENT SURGERY  06/2014  . TONSILLECTOMY    . VITRECTOMY Right 06/16/2014    FAMILY HISTORY Family History  Problem Relation Age of Onset  . Parkinson's disease Mother   . Arrhythmia Father   . Coronary artery disease Unknown        fhx  . Hypertension Unknown        fhx  . Sudden death Unknown        fhx  . Colon cancer Neg Hx     SOCIAL HISTORY Social History   Tobacco Use  . Smoking status: Never Smoker  . Smokeless tobacco: Never Used  Substance Use Topics  . Alcohol use: Yes    Alcohol/week: 0.0 oz    Comment: 2 beers yearly  . Drug use: No         OPHTHALMIC EXAM:  Base Eye Exam    Visual Acuity (Snellen - Linear)      Right Left   Dist [place] 20/25 20/25 -2   Dist ph [place] 20/20 -2 20/25       Tonometry (Tonopen, 8:55 AM)      Right Left   Pressure 23 12  Double checked       Pupils      Dark Light Shape React APD   Right 5 4 Round Brisk None   Left 4 3 Round Brisk None       Visual Fields (Counting fingers)      Left Right    Full Full       Extraocular Movement      Right Left    Full, Ortho Full, Ortho       Neuro/Psych    Oriented x3:  Yes   Mood/Affect:  Normal       Dilation    Both eyes:  1.0% Mydriacyl, 2.5% Phenylephrine @ 8:56 AM        Slit Lamp and Fundus Exam    Slit Lamp Exam      Right Left   Lids/Lashes Dermatochalasis - upper lid, Telangiectasia, Meibomian gland dysfunction Dermatochalasis - upper lid, Telangiectasia, Meibomian gland dysfunction   Conjunctiva/Sclera White and quiet White and quiet   Cornea Clear, mild arcus Clear, mild arcus   Anterior Chamber Deep and quiet, no cell or flare Deep and quiet, no cell or flare   Iris Round and dilated Round and dilated   Lens PC IOL in good position, clear PC PC IOL in good position   Vitreous Normal; clear; post viterctomy Vitreous syneresis, Posterior  vitreous detachment       Fundus Exam      Right Left   Disc Normal Normal   C/D Ratio 0.3 0.2  Macula Blunted foveal reflex, mild Retinal pigment epithelial mottling, No heme  Good foveal reflex, Flat, No heme or edema   Vessels Tortuous Tortuous   Periphery Attached 360, 360 laser scarring, No RT/RD on 360 scleral depressed exam Cotton wool spot at 1130 2DD from disc          IMAGING AND PROCEDURES  Imaging and Procedures for 07/17/17  OCT, Retina - OU - Both Eyes       Right Eye Quality was good. Central Foveal Thickness: 309. Progression has improved. Findings include normal foveal contour, no SRF, no IRF (Trace ERM superiorly, resolution of IRF).   Left Eye Quality was good. Central Foveal Thickness: 278. Progression has been stable. Findings include normal foveal contour, no IRF, no SRF (Trace ERM ).   Notes *Images captured and stored on drive  Diagnosis / Impression:  OD: NFP with resolution of IRF / cystic changes centrally; no SRF OS: NFP, No IRF/SRF  Clinical management:  See below  Abbreviations: NFP - Normal foveal profile. CME - cystoid macular edema. PED - pigment epithelial detachment. IRF - intraretinal fluid. SRF - subretinal fluid. EZ - ellipsoid zone. ERM - epiretinal membrane. ORA - outer retinal atrophy. ORT - outer retinal tubulation. SRHM - subretinal hyper-reflective material                  ASSESSMENT/PLAN:    ICD-10-CM   1. Cystoid macular edema of right eye H35.351   2. Retinal edema H35.81 OCT, Retina - OU - Both Eyes  3. Hx of retinal detachment Z86.69   4. Posterior vitreous detachment of left eye H43.812   5. Pseudophakia of both eyes Z96.1     1,2. CME OD - resolved - blunted foveal reflex - mild cystic changes on OCT improved / resolved - FA at initial visit on 02.28.19 with mild late petaloid leakage in fovea OD - likely related to history of RD and pseudophakia -- surgeries back in 2016 - CME resolved on PF and  ketorolac QID OD - decrease ketorolac to BID; begin PF taper TID x 1 week, BID x 1 week, QD x 1 week - f/u in 4-6 wks with DFE/OCT/possible repeat FA  3. History of RD repair OD-  - S/P PPV/laser by expert surgeon, Dr. Tempie Hoist (03.08.16) - retina attached 360 with good 360 laser in place - No RT/RD on 360 scleral depressed exam today - monitor  4. PVD / vitreous syneresis OS-  - Discussed findings and prognosis - No RT or RD on 360 scleral depressed exam - Reviewed s/s of RT/RD - Strict return precautions for any such RT/RD signs/symptoms  5. Pseudophakia OU  - s/p CE/IOL OU  - beautiful surgery, doing well  - monitor   Ophthalmic Meds Ordered this visit:  No orders of the defined types were placed in this encounter.      Return in about 5 weeks (around 08/21/2017) for F/U CME OD, Dilated exam, OCT.  There are no Patient Instructions on file for this visit.   Explained the diagnoses, plan, and follow up with the patient and they expressed understanding.  Patient expressed understanding of the importance of proper follow up care.   This document serves as a record of services personally performed by Gardiner Sleeper, MD, PhD. It was created on their behalf by Catha Brow, [place], a certified ophthalmic assistant. The creation of this record is the provider's dictation and/or activities during the visit.  Electronically signed by: Ailene Ravel  Erline Levine  07/17/17 9:18 AM   Gardiner Sleeper, M.D., Ph.D. Diseases & Surgery of the Retina and [place] 07/17/17  I have reviewed the above documentation for accuracy and completeness, and I agree with the above. Gardiner Sleeper, M.D., Ph.D. 07/17/17 9:18 AM     Abbreviations: M myopia (nearsighted); A astigmatism; H hyperopia (farsighted); P presbyopia; Mrx spectacle prescription;  CTL contact lenses; OD right eye; OS left eye; OU both eyes  XT exotropia; ET esotropia; PEK punctate  epithelial keratitis; PEE punctate epithelial erosions; DES dry eye syndrome; MGD meibomian gland dysfunction; ATs artificial tears; PFAT's preservative free artificial tears; [place] nuclear sclerotic cataract; PSC posterior subcapsular cataract; ERM epi-retinal membrane; PVD posterior vitreous detachment; RD retinal detachment; DM diabetes mellitus; DR diabetic retinopathy; NPDR non-proliferative diabetic retinopathy; PDR proliferative diabetic retinopathy; CSME clinically significant macular edema; DME diabetic macular edema; dbh dot blot hemorrhages; CWS cotton wool spot; POAG primary open angle glaucoma; C/D cup-to-disc ratio; HVF humphrey visual field; GVF goldmann visual field; OCT optical coherence tomography; IOP intraocular pressure; BRVO Branch retinal vein occlusion; CRVO central retinal vein occlusion; CRAO central retinal artery occlusion; BRAO branch retinal artery occlusion; RT retinal tear; SB scleral buckle; PPV pars plana vitrectomy; VH Vitreous hemorrhage; PRP panretinal laser photocoagulation; IVK intravitreal kenalog; VMT vitreomacular traction; MH Macular hole;  NVD neovascularization of the disc; NVE neovascularization elsewhere; AREDS age related eye disease study; ARMD age related macular degeneration; POAG primary open angle glaucoma; EBMD epithelial/anterior basement membrane dystrophy; ACIOL anterior chamber intraocular lens; IOL intraocular lens; PCIOL posterior chamber intraocular lens; Phaco/IOL phacoemulsification with intraocular lens placement; [place] photorefractive keratectomy; LASIK laser assisted in situ keratomileusis; HTN hypertension; DM diabetes mellitus; COPD chronic obstructive pulmonary disease

## 2017-07-17 ENCOUNTER — Ambulatory Visit (INDEPENDENT_AMBULATORY_CARE_PROVIDER_SITE_OTHER): Payer: BLUE CROSS/BLUE SHIELD | Admitting: Ophthalmology

## 2017-07-17 ENCOUNTER — Encounter (INDEPENDENT_AMBULATORY_CARE_PROVIDER_SITE_OTHER): Payer: Self-pay | Admitting: Ophthalmology

## 2017-07-17 DIAGNOSIS — Z961 Presence of intraocular lens: Secondary | ICD-10-CM

## 2017-07-17 DIAGNOSIS — H3581 Retinal edema: Secondary | ICD-10-CM

## 2017-07-17 DIAGNOSIS — H35351 Cystoid macular degeneration, right eye: Secondary | ICD-10-CM

## 2017-07-17 DIAGNOSIS — H43812 Vitreous degeneration, left eye: Secondary | ICD-10-CM

## 2017-07-17 DIAGNOSIS — Z8669 Personal history of other diseases of the nervous system and sense organs: Secondary | ICD-10-CM

## 2017-08-28 ENCOUNTER — Encounter (INDEPENDENT_AMBULATORY_CARE_PROVIDER_SITE_OTHER): Payer: Self-pay | Admitting: Ophthalmology

## 2017-09-13 ENCOUNTER — Ambulatory Visit (INDEPENDENT_AMBULATORY_CARE_PROVIDER_SITE_OTHER): Payer: Self-pay | Admitting: Family Medicine

## 2017-09-13 ENCOUNTER — Encounter: Payer: Self-pay | Admitting: Family Medicine

## 2017-09-13 VITALS — BP 162/100 | HR 61 | Temp 98.2°F | Ht 71.0 in | Wt 221.1 lb

## 2017-09-13 DIAGNOSIS — I10 Essential (primary) hypertension: Secondary | ICD-10-CM

## 2017-09-13 DIAGNOSIS — D582 Other hemoglobinopathies: Secondary | ICD-10-CM

## 2017-09-13 DIAGNOSIS — E785 Hyperlipidemia, unspecified: Secondary | ICD-10-CM

## 2017-09-13 DIAGNOSIS — Z125 Encounter for screening for malignant neoplasm of prostate: Secondary | ICD-10-CM

## 2017-09-13 LAB — COMPREHENSIVE METABOLIC PANEL
ALK PHOS: 62 U/L (ref 39–117)
ALT: 13 U/L (ref 0–53)
AST: 18 U/L (ref 0–37)
Albumin: 4.2 g/dL (ref 3.5–5.2)
BILIRUBIN TOTAL: 1.4 mg/dL — AB (ref 0.2–1.2)
BUN: 21 mg/dL (ref 6–23)
CALCIUM: 9.3 mg/dL (ref 8.4–10.5)
CO2: 27 mEq/L (ref 19–32)
CREATININE: 1 mg/dL (ref 0.40–1.50)
Chloride: 105 mEq/L (ref 96–112)
GFR: 80.18 mL/min (ref >60.00)
Glucose, Bld: 102 mg/dL — ABNORMAL HIGH (ref 70–99)
Potassium: 4.2 mEq/L (ref 3.5–5.1)
SODIUM: 141 meq/L (ref 135–145)
TOTAL PROTEIN: 6.3 g/dL (ref 6.0–8.3)

## 2017-09-13 LAB — LIPID PANEL
CHOL/HDL RATIO: 5
Cholesterol: 176 mg/dL (ref 0–200)
HDL: 32.3 mg/dL — ABNORMAL LOW (ref >39.00)
LDL Cholesterol: 121 mg/dL — ABNORMAL HIGH (ref 0–99)
NONHDL: 143.7
TRIGLYCERIDES: 114 mg/dL (ref 0.0–149.0)
VLDL: 22.8 mg/dL (ref 0.0–40.0)

## 2017-09-13 LAB — CBC
HCT: 47 % (ref 39.0–52.0)
Hemoglobin: 16.5 g/dL (ref 13.0–17.0)
MCHC: 35.2 g/dL (ref 30.0–36.0)
MCV: 84.5 fl (ref 78.0–100.0)
PLATELETS: 170 10*3/uL (ref 150.0–400.0)
RBC: 5.56 Mil/uL (ref 4.22–5.81)
RDW: 13 % (ref 11.5–15.5)
WBC: 5.1 10*3/uL (ref 4.0–10.5)

## 2017-09-13 LAB — PSA: PSA: 0.71 ng/mL (ref 0.10–4.00)

## 2017-09-13 MED ORDER — LISINOPRIL-HYDROCHLOROTHIAZIDE 20-12.5 MG PO TABS: 1.0000 | ORAL_TABLET | Freq: Every day | ORAL | 3 refills | Status: DC

## 2017-09-13 NOTE — Progress Notes (Signed)
Chief Complaint  Patient presents with  . Follow-up    hypertension    Subjective [name] [name] is a 62 y.o. male who presents for hypertension follow up. He does not monitor home blood pressures. He is not compliant with medication- lisinopril 20 mg/d. Patient has these side effects of medication: none He is adhering to a healthy diet overall. Current exercise: plays tennis 3-4x/week   Past Medical History:  Diagnosis Date  . Ankle fracture   . Cancer [facility]) 1974   testicular cancer-at age 26  . GERD (gastroesophageal reflux disease)    in the past  . Hx of colonic polyps    pt unsure,thinks this was done in 1982 in [place]. no reports in EPIC  . Hyperlipidemia   . Hypertension   . Kidney stones   . Patella fracture   . Retinal tear of right eye   . Syncopal episodes 2008   Review of Systems Cardiovascular: no chest pain Respiratory:  no shortness of breath  Exam BP (!) 162/100 (BP Location: Right Arm, Patient Position: Sitting, Cuff Size: Large)   Pulse 61   Temp 98.2 F (36.8 C) (Oral)   Ht 5\' 11"  (1.803 m)   Wt 221 lb 2 oz (100.3 kg)   SpO2 96%   BMI 30.84 kg/m  General:  well developed, well nourished, in no apparent distress Heart: RRR, no bruits, no LE edema Lungs: clear to auscultation, no accessory muscle use Psych: well oriented with normal range of affect and appropriate judgment/insight  Essential hypertension - Plan: lisinopril-hydrochlorothiazide (PRINZIDE,ZESTORETIC) 20-12.5 MG tablet, Comprehensive metabolic panel  Hyperlipidemia, unspecified hyperlipidemia type - Plan: Lipid panel  Elevated hemoglobin (HCC) - Plan: CBC  Screening for prostate cancer - Plan: PSA  Orders as above. Ck above labs, discussed each one as he is self pay. DASH info given. Counseled on diet and exercise. F/u in 6 weeks, send readings in 3-4 weeks. The patient voiced understanding and agreement to the plan.  [place], DO 09/13/17  7:30 AM

## 2017-09-13 NOTE — Patient Instructions (Addendum)
Send me your blood pressure readings in the next 3-4 weeks.  Stay active and keep the diet clean.  1-2 days to get the results of your labs back.   Let us know if you need anything.  DASH Eating Plan DASH stands for "Dietary Approaches to Stop Hypertension." The DASH eating plan is a healthy eating plan that has been shown to reduce high blood pressure (hypertension). It may also reduce your risk for type 2 diabetes, heart disease, and stroke. The DASH eating plan may also help with weight loss. What are tips for following this plan? General guidelines  Avoid eating more than 2,300 mg (milligrams) of salt (sodium) a day. If you have hypertension, you may need to reduce your sodium intake to 1,500 mg a day.  Limit alcohol intake to no more than 1 drink a day for nonpregnant women and 2 drinks a day for men. One drink equals 12 oz of beer, 5 oz of wine, or 1 oz of hard liquor.  Work with your health care provider to maintain a healthy body weight or to lose weight. Ask what an ideal weight is for you.  Get at least 30 minutes of exercise that causes your heart to beat faster (aerobic exercise) most days of the week. Activities may include walking, swimming, or biking.  Work with your health care provider or diet and nutrition specialist (dietitian) to adjust your eating plan to your individual calorie needs. Reading food labels  Check food labels for the amount of sodium per serving. Choose foods with less than 5 percent of the Daily Value of sodium. Generally, foods with less than 300 mg of sodium per serving fit into this eating plan.  To find whole grains, look for the word "whole" as the first word in the ingredient list. Shopping  Buy products labeled as "low-sodium" or "no salt added."  Buy fresh foods. Avoid canned foods and premade or frozen meals. Cooking  Avoid adding salt when cooking. Use salt-free seasonings or herbs instead of table salt or sea salt. Check with your  health care provider or pharmacist before using salt substitutes.  Do not fry foods. Cook foods using healthy methods such as baking, boiling, grilling, and broiling instead.  Cook with heart-healthy oils, such as olive, canola, soybean, or sunflower oil. Meal planning   Eat a balanced diet that includes: ? 5 or more servings of fruits and vegetables each day. At each meal, try to fill half of your plate with fruits and vegetables. ? Up to 6-8 servings of whole grains each day. ? Less than 6 oz of lean meat, poultry, or fish each day. A 3-oz serving of meat is about the same size as a deck of cards. One egg equals 1 oz. ? 2 servings of low-fat dairy each day. ? A serving of nuts, seeds, or beans 5 times each week. ? Heart-healthy fats. Healthy fats called Omega-3 fatty acids are found in foods such as flaxseeds and coldwater fish, like sardines, salmon, and mackerel.  Limit how much you eat of the following: ? Canned or prepackaged foods. ? Food that is high in trans fat, such as fried foods. ? Food that is high in saturated fat, such as fatty meat. ? Sweets, desserts, sugary drinks, and other foods with added sugar. ? Full-fat dairy products.  Do not salt foods before eating.  Try to eat at least 2 vegetarian meals each week.  Eat more home-cooked food and less restaurant, buffet, and fast  food.  When eating at a restaurant, ask that your food be prepared with less salt or no salt, if possible. What foods are recommended? The items listed may not be a complete list. Talk with your dietitian about what dietary choices are best for you. Grains Whole-grain or whole-wheat bread. Whole-grain or whole-wheat pasta. Brown rice. [place]. Bulgur. Whole-grain and low-sodium cereals. Pita bread. Low-fat, low-sodium crackers. Whole-wheat flour tortillas. Vegetables Fresh or frozen vegetables (raw, steamed, roasted, or grilled). Low-sodium or reduced-sodium tomato and vegetable juice.  Low-sodium or reduced-sodium tomato sauce and tomato paste. Low-sodium or reduced-sodium canned vegetables. Fruits All fresh, dried, or frozen fruit. Canned fruit in natural juice (without added sugar). Meat and other protein foods Skinless chicken or [place]. Ground chicken or [place]. Pork with fat trimmed off. Fish and seafood. Egg whites. Dried beans, peas, or lentils. Unsalted nuts, nut butters, and seeds. Unsalted canned beans. Lean cuts of beef with fat trimmed off. Low-sodium, lean deli meat. Dairy Low-fat (1%) or fat-free (skim) milk. Fat-free, low-fat, or reduced-fat cheeses. Nonfat, low-sodium ricotta or cottage cheese. Low-fat or nonfat yogurt. Low-fat, low-sodium cheese. Fats and oils Soft margarine without trans fats. Vegetable oil. Low-fat, reduced-fat, or light mayonnaise and salad dressings (reduced-sodium). Canola, safflower, olive, soybean, and sunflower oils. Avocado. Seasoning and other foods Herbs. Spices. Seasoning mixes without salt. Unsalted popcorn and pretzels. Fat-free sweets. What foods are not recommended? The items listed may not be a complete list. Talk with your dietitian about what dietary choices are best for you. Grains Baked goods made with fat, such as croissants, muffins, or some breads. Dry pasta or rice meal packs. Vegetables Creamed or fried vegetables. Vegetables in a cheese sauce. Regular canned vegetables (not low-sodium or reduced-sodium). Regular canned tomato sauce and paste (not low-sodium or reduced-sodium). Regular tomato and vegetable juice (not low-sodium or reduced-sodium). Angie Fava. Olives. Fruits Canned fruit in a light or heavy syrup. Fried fruit. Fruit in cream or butter sauce. Meat and other protein foods Fatty cuts of meat. Ribs. Fried meat. Berniece Salines. Sausage. Bologna and other processed lunch meats. Salami. Fatback. Hotdogs. Bratwurst. Salted nuts and seeds. Canned beans with added salt. Canned or smoked fish. Whole eggs or egg yolks. Chicken  or [place] with skin. Dairy Whole or 2% milk, cream, and half-and-half. Whole or full-fat cream cheese. Whole-fat or sweetened yogurt. Full-fat cheese. Nondairy creamers. Whipped toppings. Processed cheese and cheese spreads. Fats and oils Butter. Stick margarine. Lard. Shortening. Ghee. Bacon fat. Tropical oils, such as coconut, palm kernel, or palm oil. Seasoning and other foods Salted popcorn and pretzels. Onion salt, garlic salt, seasoned salt, table salt, and sea salt. Worcestershire sauce. Tartar sauce. Barbecue sauce. Teriyaki sauce. Soy sauce, including reduced-sodium. Steak sauce. Canned and packaged gravies. Fish sauce. Oyster sauce. Cocktail sauce. Horseradish that you find on the shelf. Ketchup. Mustard. Meat flavorings and tenderizers. Bouillon cubes. Hot sauce and Tabasco sauce. Premade or packaged marinades. Premade or packaged taco seasonings. Relishes. Regular salad dressings. Where to find more information:  National Heart, Lung, and [place]: https://wilson-eaton.com/  American Heart Association: www.heart.org Summary  The DASH eating plan is a healthy eating plan that has been shown to reduce high blood pressure (hypertension). It may also reduce your risk for type 2 diabetes, heart disease, and stroke.  With the DASH eating plan, you should limit salt (sodium) intake to 2,300 mg a day. If you have hypertension, you may need to reduce your sodium intake to 1,500 mg a day.  When on the DASH  eating plan, aim to eat more fresh fruits and vegetables, whole grains, lean proteins, low-fat dairy, and heart-healthy fats.  Work with your health care provider or diet and nutrition specialist (dietitian) to adjust your eating plan to your individual calorie needs. This information is not intended to replace advice given to you by your health care provider. Make sure you discuss any questions you have with your health care provider. Document Released: 03/16/2011 Document Revised:  03/20/2016 Document Reviewed: 03/20/2016 Elsevier Interactive Patient Education  [employer].

## 2017-09-13 NOTE — Progress Notes (Signed)
Pre visit review using our clinic review tool, if applicable. No additional management support is needed unless otherwise documented below in the visit note. 

## 2018-04-09 ENCOUNTER — Encounter: Payer: Self-pay | Admitting: *Deleted

## 2018-05-01 ENCOUNTER — Encounter (INDEPENDENT_AMBULATORY_CARE_PROVIDER_SITE_OTHER): Payer: Self-pay | Admitting: Ophthalmology

## 2018-05-01 ENCOUNTER — Encounter (INDEPENDENT_AMBULATORY_CARE_PROVIDER_SITE_OTHER): Payer: Self-pay

## 2018-05-26 NOTE — Progress Notes (Signed)
[facility] Note  05/27/2018     CHIEF COMPLAINT Patient presents for Retina Evaluation   HISTORY OF PRESENT ILLNESS: [name] [name] is a 63 y.o. male who presents to the clinic today for:   HPI    Retina Evaluation    In both eyes.  This started 3 months ago.  Duration of 3 months.  Associated Symptoms Negative for Flashes, Blind Spot, Photophobia, Scalp Tenderness, Fever, Floaters, Pain, Glare, Jaw Claudication, Weight Loss, Distortion, Redness, Trauma, Shoulder/Hip pain and Fatigue.  Context:  distance vision, mid-range vision and near vision.  Treatments tried include no treatments.  I, the attending physician,  performed the HPI with the patient and updated documentation appropriately.          Comments    Patient c/o film over vision OU for the past 3 months. Noticing more floaters in the past 3 months OU. Denies flashes.        Last edited by Bernarda Caffey, MD on 05/27/2018  8:39 AM. (History)    Pt states he feels like it takes his eyes a longer time to adjust and he feels like he has a haze around images when looking at them, pt denies flashes, but says he does occasionally have floaters OD  Referring physician: [place], [place], [address], [place] [id number]  HISTORICAL INFORMATION:   Selected notes from the [facility] Referred by Dr. Zigmund Daniel for possible RD OD;  LEE- 06.14.16 (JDM) [BCVA OD: 20/40+2 OS: 20/20-1] Ocular Hx- S/P laser break OD, HTN ret OU, S/P 23/25g PPV, laser, C3F8 OD (03.08.16 -JDM), pseudophakia OU PMH- HTN    CURRENT MEDICATIONS: Current Outpatient Medications (Ophthalmic Drugs)  Medication Sig  . Difluprednate (DUREZOL) 0.05 % EMUL Place 1 drop into the right eye 4 (four) times daily. (Patient not taking: Reported on 05/27/2018)  . ketorolac (ACULAR) 0.5 % ophthalmic solution Place 1 drop into the right eye 4 (four) times daily. (Patient not taking: Reported on 05/27/2018)  . prednisoLONE  acetate (PRED FORTE) 1 % ophthalmic suspension Place 1 drop into the right eye 4 (four) times daily. (Patient not taking: Reported on 05/27/2018)   No current facility-administered medications for this visit.  (Ophthalmic Drugs)   Current Outpatient Medications (Other)  Medication Sig  . lisinopril-hydrochlorothiazide (PRINZIDE,ZESTORETIC) 20-12.5 MG tablet Take 1 tablet by mouth daily.   Current Facility-Administered Medications (Other)  Medication Route  . 0.9 %  sodium chloride infusion Intravenous      REVIEW OF SYSTEMS: ROS    Positive for: Musculoskeletal, Eyes   Negative for: Constitutional, Gastrointestinal, Neurological, Skin, Genitourinary, HENT, Endocrine, Cardiovascular, Respiratory, Psychiatric, Allergic/Imm, Heme/Lymph   Last edited by Roselee Nova D on 05/27/2018  8:12 AM. (History)       ALLERGIES No Known Allergies  PAST MEDICAL HISTORY Past Medical History:  Diagnosis Date  . Ankle fracture   . Cancer [facility]) 1974   testicular cancer-at age 73  . GERD (gastroesophageal reflux disease)    in the past  . Hx of colonic polyps    pt unsure,thinks this was done in 1982 in [place]. no reports in EPIC  . Hyperlipidemia   . Hypertension   . Kidney stones   . Patella fracture   . Retinal tear of right eye   . Syncopal episodes 2008   Past Surgical History:  Procedure Laterality Date  . AIR/FLUID EXCHANGE Right 06/16/2014   Procedure: AIR/FLUID EXCHANGE;  Surgeon: Hayden Pedro, MD;  Location: [place];  Service: Ophthalmology;  Laterality: Right;  . CATARACT EXTRACTION    . CATARACT EXTRACTION  09/2014   rt eye//left 01/2015  . CATARACT EXTRACTION, BILATERAL  2016   per Dr. Herbert Deaner   . COLONOSCOPY  10-21-12   per Dr. Sharlett Iles, adenomatous polyp, repeat in 3 yrs   . EYE SURGERY    . GAS INSERTION Right 06/16/2014   Procedure: INSERTION OF GAS;  Surgeon: Hayden Pedro, MD;  Location: [place];  Service: Ophthalmology;  Laterality: Right;  C3F8  . HYDROCELE EXCISION     . KNEE ARTHROSCOPY Right    x 2  . LASER PHOTO ABLATION Right 06/16/2014   Procedure: LASER PHOTO ABLATION;  Surgeon: Hayden Pedro, MD;  Location: [facility];  Service: Ophthalmology;  Laterality: Right;  Headscope laser and endolaser   . ORCHIECTOMY     right testicle  . REPAIR OF COMPLEX TRACTION RETINAL DETACHMENT Right 06/16/2014   Procedure: REPAIR OF COMPLEX TRACTION RETINAL DETACHMENT;  Surgeon: Hayden Pedro, MD;  Location: [facility];  Service: Ophthalmology;  Laterality: Right;  . RETINAL DETACHMENT SURGERY  06/2014  . TONSILLECTOMY    . VITRECTOMY Right 06/16/2014    FAMILY HISTORY Family History  Problem Relation Age of Onset  . Parkinson's disease Mother   . Arrhythmia Father   . Coronary artery disease Other        fhx  . Hypertension Other        fhx  . Sudden death Other        fhx  . Colon cancer Neg Hx     SOCIAL HISTORY Social History   Tobacco Use  . Smoking status: Never Smoker  . Smokeless tobacco: Never Used  Substance Use Topics  . Alcohol use: Yes    Alcohol/week: 0.0 standard drinks    Comment: 2 beers yearly  . Drug use: No         OPHTHALMIC EXAM:  Base Eye Exam    Visual Acuity (Snellen - Linear)      Right Left   Dist [place] 20/30 20/50   Dist ph [place] NI 20/20 -2       Tonometry (Tonopen, 8:21 AM)      Right Left   Pressure 13 16       Pupils      Dark Light Shape React APD   Right 5 4 Round Brisk None   Left 4 3 Round Brisk None       Visual Fields (Counting fingers)      Left Right    Full Full       Extraocular Movement      Right Left    Full, Ortho Full, Ortho       Neuro/Psych    Oriented x3:  Yes   Mood/Affect:  Normal       Dilation    Both eyes:  1.0% Mydriacyl, 2.5% Phenylephrine @ 8:21 AM        Slit Lamp and Fundus Exam    Slit Lamp Exam      Right Left   Lids/Lashes Dermatochalasis - upper lid, Telangiectasia, Meibomian gland dysfunction Dermatochalasis - upper lid, Telangiectasia, Meibomian gland  dysfunction   Conjunctiva/Sclera White and quiet White and quiet   Cornea mild arcus, 1+ Punctate epithelial erosions mild arcus, 2+ inferior Punctate epithelial erosions   Anterior Chamber Deep and quiet, no cell or flare Deep and quiet, no cell or flare   Iris Round and  dilated Round and dilated   Lens PC IOL in good position, 2-3+ Posterior capsular opacification PC IOL in good position, trace-1+PCO   Vitreous Vitreous syneresis Vitreous syneresis, Posterior vitreous detachment       Fundus Exam      Right Left   Disc Pink and Sharp Pink and Sharp   C/D Ratio 0.3 0.2   Macula Flat, Good foveal reflex, No heme or edema Flat, Good foveal reflex, No heme or edema   Vessels Vascular attenuation, mild Tortuousity Vascular attenuation, mild Tortuousity   Periphery Attached 360, 360 laser scarring, No RT/RD on 360 peripheral exam Attached, Cotton wool spot at 1130 2DD from disc        Refraction    Manifest Refraction      Sphere Cylinder Axis Dist VA   Right -0.50 +0.50 180 20/25+2   Left -1.50 +0.50 178 20/30+2          IMAGING AND PROCEDURES  Imaging and Procedures for 07/17/17  OCT, Retina - OU - Both Eyes       Right Eye Quality was borderline. Central Foveal Thickness: 308. Progression has been stable. Findings include normal foveal contour, no SRF, no IRF.   Left Eye Quality was good. Central Foveal Thickness: 282. Progression has been stable. Findings include normal foveal contour, no IRF, no SRF.   Notes *Images captured and stored on drive  Diagnosis / Impression:  OU: NFP, No IRF/SRF No CME OU   Clinical management:  See below  Abbreviations: NFP - Normal foveal profile. CME - cystoid macular edema. PED - pigment epithelial detachment. IRF - intraretinal fluid. SRF - subretinal fluid. EZ - ellipsoid zone. ERM - epiretinal membrane. ORA - outer retinal atrophy. ORT - outer retinal tubulation. SRHM - subretinal hyper-reflective material                   ASSESSMENT/PLAN:    ICD-10-CM   1. Cystoid macular edema of right eye H35.351   2. Retinal edema H35.81 OCT, Retina - OU - Both Eyes  3. Hx of retinal detachment Z86.69   4. Posterior vitreous detachment of left eye H43.812   5. Pseudophakia of both eyes Z96.1   6. PCO (posterior capsular opacification), bilateral H26.493     1,2. CME OD - resolved - mild cystic changes on OCT improved / resolved today -- cleared with PF and ketorolac in 2019 - FA at initial visit on 02.28.19 with mild late petaloid leakage in fovea OD - likely related to history of RD and pseudophakia -- surgeries back in 2016 - monitor - f/u here PRN  3. History of RD repair OD-  - S/P PPV/laser by expert surgeon, Dr. Tempie Hoist (03.08.16) - retina attached 360 with good 360 laser in place - No RT/RD on repeat 360 peripheral exam today - monitor  4. PVD / vitreous syneresis OS-  - Discussed findings and prognosis - No RT or RD on 360 scleral depressed exam - Reviewed s/s of RT/RD - Strict return precautions for any such RT/RD signs/symptoms  5. Pseudophakia OU  - s/p CE/IOL OU (2016)  - beautiful surgeries  - monitor  6. PCO OU (OD > OS) - clear from a retina standpoint to proceed with YAG when pt and surgeon are ready - scheduled for consult with Dr. Herbert Deaner in early March 2020 per pt   Ophthalmic Meds Ordered this visit:  No orders of the defined types were placed in this encounter.  Return if symptoms worsen or fail to improve.  There are no Patient Instructions on file for this visit.   Explained the diagnoses, plan, and follow up with the patient and they expressed understanding.  Patient expressed understanding of the importance of proper follow up care.   This document serves as a record of services personally performed by Gardiner Sleeper, MD, PhD. It was created on their behalf by Ernest Mallick, OA, an ophthalmic assistant. The creation of this record is the provider's  dictation and/or activities during the visit.    Electronically signed by: Ernest Mallick, OA  02.16.2020 12:34 PM    Gardiner Sleeper, M.D., Ph.D. Diseases & Surgery of the Retina and Vitreous [facility]  I have reviewed the above documentation for accuracy and completeness, and I agree with the above. Gardiner Sleeper, M.D., Ph.D. 05/27/18 12:34 PM     Abbreviations: M myopia (nearsighted); A astigmatism; H hyperopia (farsighted); P presbyopia; Mrx spectacle prescription;  CTL contact lenses; OD right eye; OS left eye; OU both eyes  XT exotropia; ET esotropia; PEK punctate epithelial keratitis; PEE punctate epithelial erosions; DES dry eye syndrome; MGD meibomian gland dysfunction; ATs artificial tears; PFAT's preservative free artificial tears; [place] nuclear sclerotic cataract; PSC posterior subcapsular cataract; ERM epi-retinal membrane; PVD posterior vitreous detachment; RD retinal detachment; DM diabetes mellitus; DR diabetic retinopathy; NPDR non-proliferative diabetic retinopathy; PDR proliferative diabetic retinopathy; CSME clinically significant macular edema; DME diabetic macular edema; dbh dot blot hemorrhages; CWS cotton wool spot; POAG primary open angle glaucoma; C/D cup-to-disc ratio; HVF humphrey visual field; GVF goldmann visual field; OCT optical coherence tomography; IOP intraocular pressure; BRVO Branch retinal vein occlusion; CRVO central retinal vein occlusion; CRAO central retinal artery occlusion; BRAO branch retinal artery occlusion; RT retinal tear; SB scleral buckle; PPV pars plana vitrectomy; VH Vitreous hemorrhage; PRP panretinal laser photocoagulation; IVK intravitreal kenalog; VMT vitreomacular traction; MH Macular hole;  NVD neovascularization of the disc; NVE neovascularization elsewhere; AREDS age related eye disease study; ARMD age related macular degeneration; POAG primary open angle glaucoma; EBMD epithelial/anterior basement membrane dystrophy;  ACIOL anterior chamber intraocular lens; IOL intraocular lens; PCIOL posterior chamber intraocular lens; Phaco/IOL phacoemulsification with intraocular lens placement; [place] photorefractive keratectomy; LASIK laser assisted in situ keratomileusis; HTN hypertension; DM diabetes mellitus; COPD chronic obstructive pulmonary disease

## 2018-05-27 ENCOUNTER — Ambulatory Visit (INDEPENDENT_AMBULATORY_CARE_PROVIDER_SITE_OTHER): Payer: Self-pay | Admitting: Ophthalmology

## 2018-05-27 ENCOUNTER — Encounter (INDEPENDENT_AMBULATORY_CARE_PROVIDER_SITE_OTHER): Payer: Self-pay | Admitting: Ophthalmology

## 2018-05-27 DIAGNOSIS — H35351 Cystoid macular degeneration, right eye: Secondary | ICD-10-CM

## 2018-05-27 DIAGNOSIS — H26493 Other secondary cataract, bilateral: Secondary | ICD-10-CM

## 2018-05-27 DIAGNOSIS — H3581 Retinal edema: Secondary | ICD-10-CM

## 2018-05-27 DIAGNOSIS — H43812 Vitreous degeneration, left eye: Secondary | ICD-10-CM

## 2018-05-27 DIAGNOSIS — Z8669 Personal history of other diseases of the nervous system and sense organs: Secondary | ICD-10-CM

## 2018-05-27 DIAGNOSIS — Z961 Presence of intraocular lens: Secondary | ICD-10-CM

## 2018-05-28 ENCOUNTER — Encounter: Payer: Self-pay | Admitting: Internal Medicine

## 2018-07-17 NOTE — Progress Notes (Addendum)
[facility] Note  07/19/2018     CHIEF COMPLAINT Patient presents for Retina Follow Up   HISTORY OF PRESENT ILLNESS: [name] [name] is a 63 y.o. male who presents to the clinic today for:   HPI    Retina Follow Up    Patient presents with  Other.  In right eye.  This started months ago.  Severity is moderate.  Duration of 7 weeks.  Since onset it is gradually improving.  I, the attending physician,  performed the HPI with the patient and updated documentation appropriately.          Comments    63 y/o male pt referred back by Dr. Herbert Deaner for eval of recurrent CME OD.  Dr. Herbert Deaner put pt on Prolensa TID OD and Pred QID OD about 2 wks ago, and VA OD has improved.  No change in [place] OS.  Denies pain, flashes, floaters.  Systane prn OU.       Last edited by Bernarda Caffey, MD on 07/19/2018  9:15 AM. (History)    Pt states he was sent here by Dr. Herbert Deaner again for re-current CME after a YAG procedure, he states when he went back to see her she put him on Prolensa and PF once a day, she states at his next appt she increased both drops to TID, pt states since then his vision has dramatically improved   Referring physician: Monna Fam, MD [place], [place] [address]  HISTORICAL INFORMATION:   Selected notes from the MEDICAL RECORD NUMBER Referred by Dr. Zigmund Daniel for possible RD OD;  LEE- 06.14.16 (JDM) [BCVA OD: 20/40+2 OS: 20/20-1] Ocular Hx- S/P laser break OD, HTN ret OU, S/P 23/25g PPV, laser, C3F8 OD (03.08.16 -JDM), pseudophakia OU PMH- HTN    CURRENT MEDICATIONS: Current Outpatient Medications (Ophthalmic Drugs)  Medication Sig  . prednisoLONE acetate (PRED FORTE) 1 % ophthalmic suspension Place 1 drop into the right eye 4 (four) times daily.  . Difluprednate (DUREZOL) 0.05 % EMUL Place 1 drop into the right eye 4 (four) times daily. (Patient not taking: Reported on 05/27/2018)  . ketorolac (ACULAR) 0.5 % ophthalmic solution Place  1 drop into the right eye 4 (four) times daily. (Patient not taking: Reported on 05/27/2018)   No current facility-administered medications for this visit.  (Ophthalmic Drugs)   Current Outpatient Medications (Other)  Medication Sig  . lisinopril-hydrochlorothiazide (PRINZIDE,ZESTORETIC) 20-12.5 MG tablet Take 1 tablet by mouth daily.   Current Facility-Administered Medications (Other)  Medication Route  . 0.9 %  sodium chloride infusion Intravenous      REVIEW OF SYSTEMS: ROS    Positive for: Eyes   Negative for: Constitutional, Gastrointestinal, Neurological, Skin, Genitourinary, Musculoskeletal, HENT, Endocrine, Cardiovascular, Respiratory, Psychiatric, Allergic/Imm, Heme/Lymph   Last edited by Matthew Folks, COA on 07/19/2018  8:45 AM. (History)       ALLERGIES No Known Allergies  PAST MEDICAL HISTORY Past Medical History:  Diagnosis Date  . Ankle fracture   . Cancer [facility]) 1974   testicular cancer-at age 58  . GERD (gastroesophageal reflux disease)    in the past  . Hx of colonic polyps    pt unsure,thinks this was done in 1982 in [place]. no reports in EPIC  . Hyperlipidemia   . Hypertension   . Kidney stones   . Patella fracture   . Retinal detachment    OD  . Retinal tear of right eye   . Syncopal episodes  2008   Past Surgical History:  Procedure Laterality Date  . AIR/FLUID EXCHANGE Right 06/16/2014   Procedure: AIR/FLUID EXCHANGE;  Surgeon: Hayden Pedro, MD;  Location: [place];  Service: Ophthalmology;  Laterality: Right;  . CATARACT EXTRACTION    . CATARACT EXTRACTION  09/2014   rt eye//left 01/2015  . CATARACT EXTRACTION, BILATERAL  2016   per Dr. Herbert Deaner   . COLONOSCOPY  10-21-12   per Dr. Sharlett Iles, adenomatous polyp, repeat in 3 yrs   . EYE SURGERY    . GAS INSERTION Right 06/16/2014   Procedure: INSERTION OF GAS;  Surgeon: Hayden Pedro, MD;  Location: [facility];  Service: Ophthalmology;  Laterality: Right;  C3F8  . HYDROCELE EXCISION    . KNEE  ARTHROSCOPY Right    x 2  . LASER PHOTO ABLATION Right 06/16/2014   Procedure: LASER PHOTO ABLATION;  Surgeon: Hayden Pedro, MD;  Location: [facility] [place];  Service: Ophthalmology;  Laterality: Right;  Headscope laser and endolaser   . ORCHIECTOMY     right testicle  . REPAIR OF COMPLEX TRACTION RETINAL DETACHMENT Right 06/16/2014   Procedure: REPAIR OF COMPLEX TRACTION RETINAL DETACHMENT;  Surgeon: Hayden Pedro, MD;  Location: [facility];  Service: Ophthalmology;  Laterality: Right;  . RETINAL DETACHMENT SURGERY  06/2014  . TONSILLECTOMY    . VITRECTOMY Right 06/16/2014    FAMILY HISTORY Family History  Problem Relation Age of Onset  . Parkinson's disease Mother   . Arrhythmia Father   . Coronary artery disease Other        fhx  . Hypertension Other        fhx  . Sudden death Other        fhx  . Colon cancer Neg Hx     SOCIAL HISTORY Social History   Tobacco Use  . Smoking status: Never Smoker  . Smokeless tobacco: Never Used  Substance Use Topics  . Alcohol use: Yes    Alcohol/week: 0.0 standard drinks    Comment: 2 beers yearly  . Drug use: No         OPHTHALMIC EXAM:  Base Eye Exam    Visual Acuity (Snellen - Linear)      Right Left   Dist [place] 20/25 +2 20/40   Dist ph [place] 20/20 -2 20/25 -2       Tonometry (Tonopen, 8:48 AM)      Right Left   Pressure 18 16       Pupils      Dark Light Shape React APD   Right 5 4 Round Brisk None   Left 4 3 Round Brisk None       Visual Fields (Counting fingers)      Left Right    Full Full       Extraocular Movement      Right Left    Full, Ortho Full, Ortho       Neuro/Psych    Oriented x3:  Yes   Mood/Affect:  Normal       Dilation    Both eyes:  1.0% Mydriacyl, 2.5% Phenylephrine @ 8:48 AM        Slit Lamp and Fundus Exam    Slit Lamp Exam      Right Left   Lids/Lashes Dermatochalasis - upper lid, Telangiectasia, Meibomian gland dysfunction Dermatochalasis - upper lid, Telangiectasia, Meibomian gland  dysfunction   Conjunctiva/Sclera White and quiet White and quiet   Cornea mild arcus, 1+ Punctate epithelial erosions,  Debris in tear film mild arcus, 2+ inferior Punctate epithelial erosions   Anterior Chamber Deep and quiet Deep and quiet, no cell or flare   Iris Round and dilated Round and dilated   Lens PC IOL in good position with open PC PC IOL in good position, 1+PCO   Vitreous Vitreous syneresis Vitreous syneresis, Posterior vitreous detachment       Fundus Exam      Right Left   Disc Pink and Sharp Pink and Sharp   C/D Ratio 0.2 0.2   Macula Blunted foveal reflex, mild Cystic changes temporal fovea Flat, Good foveal reflex, No heme or edema   Vessels Vascular attenuation, mild Tortuousity Vascular attenuation, mild Tortuousity   Periphery Attached 360, 360 laser scarring, No RT/RD on 360 peripheral exam Attached, Cotton wool spot at 1130 2DD from disc          IMAGING AND PROCEDURES  Imaging and Procedures for 07/17/17  OCT, Retina - OU - Both Eyes       Right Eye Quality was good. Central Foveal Thickness: 378. Progression has worsened. Findings include no SRF, intraretinal fluid, abnormal foveal contour (+CME/IRF temporal fovea).   Left Eye Quality was good. Central Foveal Thickness: 278. Progression has been stable. Findings include normal foveal contour, no IRF, no SRF.   Notes *Images captured and stored on drive  Diagnosis / Impression:  OD: mild CME/IRF, temporal fovea  OS: NFP, no IRF/SRF    Clinical management:  See below  Abbreviations: NFP - Normal foveal profile. CME - cystoid macular edema. PED - pigment epithelial detachment. IRF - intraretinal fluid. SRF - subretinal fluid. EZ - ellipsoid zone. ERM - epiretinal membrane. ORA - outer retinal atrophy. ORT - outer retinal tubulation. SRHM - subretinal hyper-reflective material                  ASSESSMENT/PLAN:    ICD-10-CM   1. Cystoid macular edema of right eye H35.351   2. Retinal  edema H35.81 OCT, Retina - OU - Both Eyes  3. Hx of retinal detachment Z86.69   4. Posterior vitreous detachment of left eye H43.812   5. Pseudophakia of both eyes Z96.1   6. PCO (posterior capsular opacification), bilateral H26.493     1,2. CME OD  - re-referral for recurrent CME after YAG OD with Dr. Herbert Deaner on 3.12.2020 - was successfully treated for CME / Kathleen Argue OD Feb 2019 topically (PF and ketorolac) - FA at initial visit on 02.28.19 with mild late petaloid leakage in fovea OD - was started on PF QID and Prolensa TID OD by Dr. Herbert Deaner on 3.20 - today, pt reports improved vision OD on topical therapy - BCVA 20/25+2 OD today from 20/40 on 3.20.20 - OCT with minimal -- suspect CME improving on topical therapy - discussed findings and treatment options - recommend continuation of PF QID and Prolensa TID per Dr. Herbert Deaner -- will hold off on STK for now - f/u here 4-6 weeks, sooner prn  3. History of RD repair OD-  - S/P PPV/laser by expert surgeon, Dr. Tempie Hoist (03.08.16) - retina attached 360 with good 360 laser in place - No RT/RD on repeat 360 peripheral exam today - monitor  4. PVD / vitreous syneresis OS-  - Discussed findings and prognosis - No RT or RD on 360 scleral depressed exam - Reviewed s/s of RT/RD - Strict return precautions for any such RT/RD signs/symptoms  5. Pseudophakia OU  - s/p CE/IOL OU (2016)  -  beautiful surgeries  - monitor  6. PCO OS  - clear from a retina standpoint to proceed with YAG when pt and surgeon are ready    Ophthalmic Meds Ordered this visit:  Meds ordered this encounter  Medications  . prednisoLONE acetate (PRED FORTE) 1 % ophthalmic suspension    Sig: Place 1 drop into the right eye 4 (four) times daily.    Dispense:  10 mL    Refill:  0       Return for f/u 4-6 weeks CME OD, DFE, OCT.  There are no Patient Instructions on file for this visit.   Explained the diagnoses, plan, and follow up with the patient and  they expressed understanding.  Patient expressed understanding of the importance of proper follow up care.   This document serves as a record of services personally performed by Gardiner Sleeper, MD, PhD. It was created on their behalf by Ernest Mallick, OA, an ophthalmic assistant. The creation of this record is the provider's dictation and/or activities during the visit.    Electronically signed by: Ernest Mallick, OA  04.08.2020 10:00 AM    Gardiner Sleeper, M.D., Ph.D. Diseases & Surgery of the Retina and Vitreous [facility]   I have reviewed the above documentation for accuracy and completeness, and I agree with the above. Gardiner Sleeper, M.D., Ph.D. 07/19/18 10:01 AM     Abbreviations: M myopia (nearsighted); A astigmatism; H hyperopia (farsighted); P presbyopia; Mrx spectacle prescription;  CTL contact lenses; OD right eye; OS left eye; OU both eyes  XT exotropia; ET esotropia; PEK punctate epithelial keratitis; PEE punctate epithelial erosions; DES dry eye syndrome; MGD meibomian gland dysfunction; ATs artificial tears; PFAT's preservative free artificial tears; [place] nuclear sclerotic cataract; PSC posterior subcapsular cataract; ERM epi-retinal membrane; PVD posterior vitreous detachment; RD retinal detachment; DM diabetes mellitus; DR diabetic retinopathy; NPDR non-proliferative diabetic retinopathy; PDR proliferative diabetic retinopathy; CSME clinically significant macular edema; DME diabetic macular edema; dbh dot blot hemorrhages; CWS cotton wool spot; POAG primary open angle glaucoma; C/D cup-to-disc ratio; HVF humphrey visual field; GVF goldmann visual field; OCT optical coherence tomography; IOP intraocular pressure; BRVO Branch retinal vein occlusion; CRVO central retinal vein occlusion; CRAO central retinal artery occlusion; BRAO branch retinal artery occlusion; RT retinal tear; SB scleral buckle; PPV pars plana vitrectomy; VH Vitreous hemorrhage; PRP panretinal  laser photocoagulation; IVK intravitreal kenalog; VMT vitreomacular traction; MH Macular hole;  NVD neovascularization of the disc; NVE neovascularization elsewhere; AREDS age related eye disease study; ARMD age related macular degeneration; POAG primary open angle glaucoma; EBMD epithelial/anterior basement membrane dystrophy; ACIOL anterior chamber intraocular lens; IOL intraocular lens; PCIOL posterior chamber intraocular lens; Phaco/IOL phacoemulsification with intraocular lens placement; [place] photorefractive keratectomy; LASIK laser assisted in situ keratomileusis; HTN hypertension; DM diabetes mellitus; COPD chronic obstructive pulmonary disease

## 2018-07-19 ENCOUNTER — Other Ambulatory Visit: Payer: Self-pay

## 2018-07-19 ENCOUNTER — Ambulatory Visit (INDEPENDENT_AMBULATORY_CARE_PROVIDER_SITE_OTHER): Payer: Self-pay | Admitting: Ophthalmology

## 2018-07-19 ENCOUNTER — Encounter (INDEPENDENT_AMBULATORY_CARE_PROVIDER_SITE_OTHER): Payer: Self-pay | Admitting: Ophthalmology

## 2018-07-19 DIAGNOSIS — H26493 Other secondary cataract, bilateral: Secondary | ICD-10-CM

## 2018-07-19 DIAGNOSIS — H35351 Cystoid macular degeneration, right eye: Secondary | ICD-10-CM

## 2018-07-19 DIAGNOSIS — Z961 Presence of intraocular lens: Secondary | ICD-10-CM

## 2018-07-19 DIAGNOSIS — H3581 Retinal edema: Secondary | ICD-10-CM

## 2018-07-19 DIAGNOSIS — Z8669 Personal history of other diseases of the nervous system and sense organs: Secondary | ICD-10-CM

## 2018-07-19 DIAGNOSIS — H43812 Vitreous degeneration, left eye: Secondary | ICD-10-CM

## 2018-07-19 MED ORDER — PREDNISOLONE ACETATE 1 % OP SUSP: 1.0000 [drp] | Freq: Four times a day (QID) | OPHTHALMIC | 0 refills | Status: DC

## 2018-07-23 ENCOUNTER — Encounter (INDEPENDENT_AMBULATORY_CARE_PROVIDER_SITE_OTHER): Payer: Self-pay | Admitting: Ophthalmology

## 2018-08-28 NOTE — Progress Notes (Signed)
[facility] Clinic Note  08/29/2018     CHIEF COMPLAINT Patient presents for Retina Follow Up   HISTORY OF PRESENT ILLNESS: [name] [name] is a 63 y.o. male who presents to the clinic today for:   HPI    Retina Follow Up    Patient presents with  Other (CME).  In right eye.  Severity is moderate.  Duration of 6 weeks.  Since onset it is gradually improving.  I, the attending physician,  performed the HPI with the patient and updated documentation appropriately.          Comments    Patient states vision improving OD. Still using PF qid OD, ran out of ketorolac 1 1/2 weeks ago.        Last edited by Bernarda Caffey, MD on 08/29/2018  8:24 AM. (History)    Pt states he noticed his vision improving about 3-4 weeks ago, he states he is still using PF QID, pt denies flashing lights  Referring physician: Shelda Pal, DO [address] [place], [place] [address]  HISTORICAL INFORMATION:   Selected notes from the MEDICAL RECORD NUMBER Referred by Dr. Zigmund Daniel for possible RD OD;  LEE- 06.14.16 (JDM) [BCVA OD: 20/40+2 OS: 20/20-1] Ocular Hx- S/P laser break OD, HTN ret OU, S/P 23/25g PPV, laser, C3F8 OD (03.08.16 -JDM), pseudophakia OU PMH- HTN    CURRENT MEDICATIONS: Current Outpatient Medications (Ophthalmic Drugs)  Medication Sig  . prednisoLONE acetate (PRED FORTE) 1 % ophthalmic suspension Place 1 drop into the right eye 4 (four) times daily.  . Difluprednate (DUREZOL) 0.05 % EMUL Place 1 drop into the right eye 4 (four) times daily. (Patient not taking: Reported on 08/29/2018)  . ketorolac (ACULAR) 0.5 % ophthalmic solution Place 1 drop into the right eye 4 (four) times daily. (Patient not taking: Reported on 05/27/2018)   No current facility-administered medications for this visit.  (Ophthalmic Drugs)   Current Outpatient Medications (Other)  Medication Sig  . lisinopril-hydrochlorothiazide (PRINZIDE,ZESTORETIC) 20-12.5 MG tablet  Take 1 tablet by mouth daily.   Current Facility-Administered Medications (Other)  Medication Route  . 0.9 %  sodium chloride infusion Intravenous      REVIEW OF SYSTEMS: ROS    Positive for: Eyes   Negative for: Constitutional, Gastrointestinal, Neurological, Skin, Genitourinary, Musculoskeletal, HENT, Endocrine, Cardiovascular, Respiratory, Psychiatric, Allergic/Imm, Heme/Lymph   Last edited by Roselee Nova D on 08/29/2018  8:10 AM. (History)       ALLERGIES No Known Allergies  PAST MEDICAL HISTORY Past Medical History:  Diagnosis Date  . Ankle fracture   . Cancer [facility]) 1974   testicular cancer-at age 39  . GERD (gastroesophageal reflux disease)    in the past  . Hx of colonic polyps    pt unsure,thinks this was done in 1982 in [place]. no reports in EPIC  . Hyperlipidemia   . Hypertension   . Kidney stones   . Patella fracture   . Retinal detachment    OD  . Retinal tear of right eye   . Syncopal episodes 2008   Past Surgical History:  Procedure Laterality Date  . AIR/FLUID EXCHANGE Right 06/16/2014   Procedure: AIR/FLUID EXCHANGE;  Surgeon: Hayden Pedro, MD;  Location: [facility];  Service: Ophthalmology;  Laterality: Right;  . CATARACT EXTRACTION    . CATARACT EXTRACTION  09/2014   rt eye//left 01/2015  . CATARACT EXTRACTION, BILATERAL  2016   per Dr. Herbert Deaner   .  COLONOSCOPY  10-21-12   per Dr. Sharlett Iles, adenomatous polyp, repeat in 3 yrs   . EYE SURGERY    . GAS INSERTION Right 06/16/2014   Procedure: INSERTION OF GAS;  Surgeon: Hayden Pedro, MD;  Location: [facility];  Service: Ophthalmology;  Laterality: Right;  C3F8  . HYDROCELE EXCISION    . KNEE ARTHROSCOPY Right    x 2  . LASER PHOTO ABLATION Right 06/16/2014   Procedure: LASER PHOTO ABLATION;  Surgeon: Hayden Pedro, MD;  Location: [facility];  Service: Ophthalmology;  Laterality: Right;  Headscope laser and endolaser   . ORCHIECTOMY     right testicle  . REPAIR OF COMPLEX TRACTION RETINAL DETACHMENT Right  06/16/2014   Procedure: REPAIR OF COMPLEX TRACTION RETINAL DETACHMENT;  Surgeon: Hayden Pedro, MD;  Location: [facility];  Service: Ophthalmology;  Laterality: Right;  . RETINAL DETACHMENT SURGERY  06/2014  . TONSILLECTOMY    . VITRECTOMY Right 06/16/2014    FAMILY HISTORY Family History  Problem Relation Age of Onset  . Parkinson's disease Mother   . Arrhythmia Father   . Coronary artery disease Other        fhx  . Hypertension Other        fhx  . Sudden death Other        fhx  . Colon cancer Neg Hx     SOCIAL HISTORY Social History   Tobacco Use  . Smoking status: Never Smoker  . Smokeless tobacco: Never Used  Substance Use Topics  . Alcohol use: Yes    Alcohol/week: 0.0 standard drinks    Comment: 2 beers yearly  . Drug use: No         OPHTHALMIC EXAM:  Base Eye Exam    Visual Acuity (Snellen - Linear)      Right Left   Dist [place] 20/20 -2 20/30 +1   Dist ph   20/20 -1       Tonometry (Tonopen, 8:17 AM)      Right Left   Pressure 23 11       Tonometry #2 (Tonopen, 8:58 AM)      Right Left   Pressure 22 10       Tonometry #3 (Tonopen, 8:58 AM)      Right Left   Pressure 25        Pupils      Dark Light Shape React APD   Right 4 3 Round Brisk None   Left 3 2 Round Brisk None       Visual Fields (Counting fingers)      Left Right    Full Full       Extraocular Movement      Right Left    Full, Ortho Full, Ortho       Neuro/Psych    Oriented x3:  Yes   Mood/Affect:  Normal       Dilation    Both eyes:  1.0% Mydriacyl, 2.5% Phenylephrine @ 8:17 AM        Slit Lamp and Fundus Exam    Slit Lamp Exam      Right Left   Lids/Lashes Dermatochalasis - upper lid, Telangiectasia, Meibomian gland dysfunction Dermatochalasis - upper lid, Telangiectasia, Meibomian gland dysfunction   Conjunctiva/Sclera White and quiet White and quiet   Cornea mild arcus, 1+ Punctate epithelial erosions, Debris in tear film mild arcus, 2+ inferior Punctate  epithelial erosions   Anterior Chamber Deep and quiet, no cell/flare Deep and quiet,  no cell or flare   Iris Round and dilated Round and dilated   Lens PC IOL in good position with open PC PC IOL in good position, 1+PCO   Vitreous Vitreous syneresis Vitreous syneresis, Posterior vitreous detachment       Fundus Exam      Right Left   Disc Pink and Sharp Pink and Sharp   C/D Ratio 0.2 0.2   Macula Flat, Good foveal reflex, CME resolved, no heme Flat, Good foveal reflex, No heme or edema   Vessels Vascular attenuation, mild Tortuousity Vascular attenuation, mild Tortuousity   Periphery Attached 360, 360 laser scarring, No RT/RD on 360 peripheral exam Attached          IMAGING AND PROCEDURES  Imaging and Procedures for 07/17/17  OCT, Retina - OU - Both Eyes       Right Eye Quality was good. Central Foveal Thickness: 311. Progression has improved. Findings include no SRF, no IRF, normal foveal contour (+CME/IRF temporal fovea - resolved today).   Left Eye Quality was good. Central Foveal Thickness: 278. Progression has been stable. Findings include normal foveal contour, no IRF, no SRF.   Notes *Images captured and stored on drive  Diagnosis / Impression:  OD: mild CME/IRF, temporal fovea - resolved today OS: NFP, no IRF/SRF    Clinical management:  See below  Abbreviations: NFP - Normal foveal profile. CME - cystoid macular edema. PED - pigment epithelial detachment. IRF - intraretinal fluid. SRF - subretinal fluid. EZ - ellipsoid zone. ERM - epiretinal membrane. ORA - outer retinal atrophy. ORT - outer retinal tubulation. SRHM - subretinal hyper-reflective material                  ASSESSMENT/PLAN:    ICD-10-CM   1. Cystoid macular edema of right eye H35.351   2. Retinal edema H35.81 OCT, Retina - OU - Both Eyes  3. Hx of retinal detachment Z86.69   4. Posterior vitreous detachment of left eye H43.812   5. Pseudophakia of both eyes Z96.1   6. PCO  (posterior capsular opacification), left H26.492   7. Ocular hypertension of right eye H40.051     1,2. CME OD   - re-referred for recurrent CME after YAG OD with Dr. Herbert Deaner on 3.12.2020  - history of successful treatment for CME / Kathleen Argue OD Feb 2019 topically (PF and ketorolac)  - FA at initial visit on 02.28.19 with mild late petaloid leakage in fovea OD  - for this episode, was started on PF QID and Prolensa TID OD by Dr. Herbert Deaner on 3.20 and was already improving at re-referral visit  - today, pt reports further improved vision OD on topical therapy  - currently on PF QID, ran out of Ketoralac 1.5 weeks ago  - BCVA 20/20 OD today from 20/40 on 3.20.20  - OCT shows complete resolution of CME OD  - IOP 23 today - likely steroid response, start Cosopt BID OD  - start PF taper -- 3,2,1 drops daily, decrease weekly  - f/u here 5 weeks, sooner prn  3. History of RD repair OD-   - S/P PPV/laser by expert surgeon, Dr. Tempie Hoist (03.08.16)  - retina attached 360 with good 360 laser in place  - No RT/RD on repeat 360 peripheral exam today  - monitor  4. PVD / vitreous syneresis OS-   - Discussed findings and prognosis  - No RT or RD on 360 scleral depressed exam  - Reviewed s/s of  RT/RD  - Strict return precautions for any such RT/RD signs/symptoms  5. Pseudophakia OU  - s/p CE/IOL OU (2016)  - s/p YAG OD  - history of [place] after both procedures  - beautiful surgeries  - monitor  6. PCO OS   - clear from a retina standpoint to proceed with YAG when pt and surgeon are ready  - may need additional anti-inflammatory drops post YAG given history of post op CME  7. Ocular hypertension OD  - IOP 23 today  - likely steroid response  - taper PF as above  - start cosopt BID OD  - f/u in 5 wks for IOP and CME check    Ophthalmic Meds Ordered this visit:  No orders of the defined types were placed in this encounter.      Return in about 5 weeks (around 10/03/2018)  for f/u IOP check.  There are no Patient Instructions on file for this visit.   Explained the diagnoses, plan, and follow up with the patient and they expressed understanding.  Patient expressed understanding of the importance of proper follow up care.   This document serves as a record of services personally performed by Gardiner Sleeper, MD, PhD. It was created on their behalf by Ernest Mallick, OA, an ophthalmic assistant. The creation of this record is the provider's dictation and/or activities during the visit.    Electronically signed by: Ernest Mallick, OA  05.20.2020 11:36 AM    Gardiner Sleeper, M.D., Ph.D. Diseases & Surgery of the Retina and Vitreous [facility]   I have reviewed the above documentation for accuracy and completeness, and I agree with the above. Gardiner Sleeper, M.D., Ph.D. 08/29/18 11:36 AM    Abbreviations: M myopia (nearsighted); A astigmatism; H hyperopia (farsighted); P presbyopia; Mrx spectacle prescription;  CTL contact lenses; OD right eye; OS left eye; OU both eyes  XT exotropia; ET esotropia; PEK punctate epithelial keratitis; PEE punctate epithelial erosions; DES dry eye syndrome; MGD meibomian gland dysfunction; ATs artificial tears; PFAT's preservative free artificial tears; [place] nuclear sclerotic cataract; PSC posterior subcapsular cataract; ERM epi-retinal membrane; PVD posterior vitreous detachment; RD retinal detachment; DM diabetes mellitus; DR diabetic retinopathy; NPDR non-proliferative diabetic retinopathy; PDR proliferative diabetic retinopathy; CSME clinically significant macular edema; DME diabetic macular edema; dbh dot blot hemorrhages; CWS cotton wool spot; POAG primary open angle glaucoma; C/D cup-to-disc ratio; HVF humphrey visual field; GVF goldmann visual field; OCT optical coherence tomography; IOP intraocular pressure; BRVO Branch retinal vein occlusion; CRVO central retinal vein occlusion; CRAO central retinal artery  occlusion; BRAO branch retinal artery occlusion; RT retinal tear; SB scleral buckle; PPV pars plana vitrectomy; VH Vitreous hemorrhage; PRP panretinal laser photocoagulation; IVK intravitreal kenalog; VMT vitreomacular traction; MH Macular hole;  NVD neovascularization of the disc; NVE neovascularization elsewhere; AREDS age related eye disease study; ARMD age related macular degeneration; POAG primary open angle glaucoma; EBMD epithelial/anterior basement membrane dystrophy; ACIOL anterior chamber intraocular lens; IOL intraocular lens; PCIOL posterior chamber intraocular lens; Phaco/IOL phacoemulsification with intraocular lens placement; [place] photorefractive keratectomy; LASIK laser assisted in situ keratomileusis; HTN hypertension; DM diabetes mellitus; COPD chronic obstructive pulmonary disease

## 2018-08-29 ENCOUNTER — Encounter (INDEPENDENT_AMBULATORY_CARE_PROVIDER_SITE_OTHER): Payer: Self-pay | Admitting: Ophthalmology

## 2018-08-29 ENCOUNTER — Ambulatory Visit (INDEPENDENT_AMBULATORY_CARE_PROVIDER_SITE_OTHER): Payer: Self-pay | Admitting: Ophthalmology

## 2018-08-29 ENCOUNTER — Other Ambulatory Visit: Payer: Self-pay

## 2018-08-29 DIAGNOSIS — H40051 Ocular hypertension, right eye: Secondary | ICD-10-CM

## 2018-08-29 DIAGNOSIS — Z961 Presence of intraocular lens: Secondary | ICD-10-CM

## 2018-08-29 DIAGNOSIS — H43812 Vitreous degeneration, left eye: Secondary | ICD-10-CM

## 2018-08-29 DIAGNOSIS — H35351 Cystoid macular degeneration, right eye: Secondary | ICD-10-CM

## 2018-08-29 DIAGNOSIS — H3581 Retinal edema: Secondary | ICD-10-CM

## 2018-08-29 DIAGNOSIS — Z8669 Personal history of other diseases of the nervous system and sense organs: Secondary | ICD-10-CM

## 2018-08-29 DIAGNOSIS — H26492 Other secondary cataract, left eye: Secondary | ICD-10-CM

## 2018-10-04 ENCOUNTER — Encounter (INDEPENDENT_AMBULATORY_CARE_PROVIDER_SITE_OTHER): Payer: Self-pay | Admitting: Ophthalmology

## 2018-11-05 NOTE — Progress Notes (Addendum)
[facility] Note  11/07/2018     CHIEF COMPLAINT Patient presents for Retina Follow Up   HISTORY OF PRESENT ILLNESS: [name] [name] is a 63 y.o. male who presents to the clinic today for:   HPI    Retina Follow Up    Patient presents with  Other (Cystoid macular edema).  In right eye.  Severity is moderate.  Duration of 10 weeks.  Since onset it is stable.  I, the attending physician,  performed the HPI with the patient and updated documentation appropriately.          Comments    Patient states sees "butterfly" in vision OD for the past week. Intermittent. Denies floaters and flashes. Restarted pred forte drop once daily OD. Using cosopt bid OD for iop control. Vision seems OK OD.        Last edited by Bernarda Caffey, MD on 11/07/2018  9:05 AM. (History)     Referring physician: Shelda Pal, DO [address] [place],  [place] [address]  HISTORICAL INFORMATION:   Selected notes from the MEDICAL RECORD NUMBER Referred by Dr. Zigmund Daniel for possible RD OD;  LEE- 06.14.16 (JDM) [BCVA OD: 20/40+2 OS: 20/20-1] Ocular Hx- S/P laser break OD, HTN ret OU, S/P 23/25g PPV, laser, C3F8 OD (03.08.16 -JDM), pseudophakia OU PMH- HTN    CURRENT MEDICATIONS: Current Outpatient Medications (Ophthalmic Drugs)  Medication Sig  . dorzolamide-timolol (COSOPT) 22.3-6.8 MG/ML ophthalmic solution Place 1 drop into the right eye 2 (two) times daily.  . Difluprednate (DUREZOL) 0.05 % EMUL Place 1 drop into the right eye 4 (four) times daily. (Patient not taking: Reported on 08/29/2018)  . ketorolac (ACULAR) 0.5 % ophthalmic solution Place 1 drop into the right eye 4 (four) times daily. (Patient not taking: Reported on 05/27/2018)  . prednisoLONE acetate (PRED FORTE) 1 % ophthalmic suspension Place 1 drop into the right eye 4 (four) times daily. (Patient taking differently: Place 1 drop into the right eye daily. )   No current facility-administered  medications for this visit.  (Ophthalmic Drugs)   Current Outpatient Medications (Other)  Medication Sig  . lisinopril-hydrochlorothiazide (PRINZIDE,ZESTORETIC) 20-12.5 MG tablet Take 1 tablet by mouth daily.   Current Facility-Administered Medications (Other)  Medication Route  . 0.9 %  sodium chloride infusion Intravenous      REVIEW OF SYSTEMS: ROS    Positive for: Eyes   Negative for: Constitutional, Gastrointestinal, Neurological, Skin, Genitourinary, Musculoskeletal, HENT, Endocrine, Cardiovascular, Respiratory, Psychiatric, Allergic/Imm, Heme/Lymph   Last edited by Roselee Nova D on 11/07/2018  8:20 AM. (History)       ALLERGIES No Known Allergies  PAST MEDICAL HISTORY Past Medical History:  Diagnosis Date  . Ankle fracture   . Cancer [facility]) 1974   testicular cancer-at age 79  . GERD (gastroesophageal reflux disease)    in the past  . Hx of colonic polyps    pt unsure,thinks this was done in 1982 in [place]. no reports in EPIC  . Hyperlipidemia   . Hypertension   . Kidney stones   . Patella fracture   . Retinal detachment    OD  . Retinal tear of right eye   . Syncopal episodes 2008   Past Surgical History:  Procedure Laterality Date  . AIR/FLUID EXCHANGE Right 06/16/2014   Procedure: AIR/FLUID EXCHANGE;  Surgeon: Hayden Pedro, MD;  Location: [facility];  Service: Ophthalmology;  Laterality: Right;  . CATARACT EXTRACTION    .  CATARACT EXTRACTION  09/2014   rt eye//left 01/2015  . CATARACT EXTRACTION, BILATERAL  2016   per Dr. Herbert Deaner   . COLONOSCOPY  10-21-12   per Dr. Sharlett Iles, adenomatous polyp, repeat in 3 yrs   . EYE SURGERY    . GAS INSERTION Right 06/16/2014   Procedure: INSERTION OF GAS;  Surgeon: Hayden Pedro, MD;  Location: [facility];  Service: Ophthalmology;  Laterality: Right;  C3F8  . HYDROCELE EXCISION    . KNEE ARTHROSCOPY Right    x 2  . LASER PHOTO ABLATION Right 06/16/2014   Procedure: LASER PHOTO ABLATION;  Surgeon: Hayden Pedro, MD;   Location: [facility];  Service: Ophthalmology;  Laterality: Right;  Headscope laser and endolaser   . ORCHIECTOMY     right testicle  . REPAIR OF COMPLEX TRACTION RETINAL DETACHMENT Right 06/16/2014   Procedure: REPAIR OF COMPLEX TRACTION RETINAL DETACHMENT;  Surgeon: Hayden Pedro, MD;  Location: [facility];  Service: Ophthalmology;  Laterality: Right;  . RETINAL DETACHMENT SURGERY  06/2014  . TONSILLECTOMY    . VITRECTOMY Right 06/16/2014    FAMILY HISTORY Family History  Problem Relation Age of Onset  . Parkinson's disease Mother   . Arrhythmia Father   . Coronary artery disease Other        fhx  . Hypertension Other        fhx  . Sudden death Other        fhx  . Colon cancer Neg Hx     SOCIAL HISTORY Social History   Tobacco Use  . Smoking status: Never Smoker  . Smokeless tobacco: Never Used  Substance Use Topics  . Alcohol use: Yes    Alcohol/week: 0.0 standard drinks    Comment: 2 beers yearly  . Drug use: No         OPHTHALMIC EXAM:  Base Eye Exam    Visual Acuity (Snellen - Linear)      Right Left   Dist [place] 20/20 -2 20/30 +2   Dist ph [place] 20/20 20/20 -2       Tonometry (Tonopen, 8:31 AM)      Right Left   Pressure 15 12       Pupils      Dark Light Shape React APD   Right 4 3 Round Brisk None   Left 3 2 Round Brisk None       Visual Fields (Counting fingers)      Left Right    Full Full       Extraocular Movement      Right Left    Full, Ortho Full, Ortho       Neuro/Psych    Oriented x3: Yes   Mood/Affect: Normal       Dilation    Both eyes: 1.0% Mydriacyl, 2.5% Phenylephrine @ 8:31 AM        Slit Lamp and Fundus Exam    Slit Lamp Exam      Right Left   Lids/Lashes Dermatochalasis - upper lid, Telangiectasia, Meibomian gland dysfunction Dermatochalasis - upper lid, Telangiectasia, Meibomian gland dysfunction   Conjunctiva/Sclera White and quiet White and quiet   Cornea mild arcus, 1+ Punctate epithelial erosions, Debris in tear film  mild arcus, 2+ inferior Punctate epithelial erosions   Anterior Chamber Deep and quiet, no cell/flare Deep and quiet, no cell or flare   Iris Round and dilated Round and dilated   Lens PC IOL in good position with open PC PC  IOL in good position, 1+PCO   Vitreous Vitreous syneresis Vitreous syneresis, Posterior vitreous detachment       Fundus Exam      Right Left   Disc Pink and Sharp Pink and Sharp   C/D Ratio 0.2 0.2   Macula Flat, Good foveal reflex, CME resolved, no heme Flat, Good foveal reflex, No heme or edema   Vessels Vascular attenuation, mild Tortuousity Vascular attenuation, mild Tortuousity   Periphery Attached 360, 360 laser scarring, No RT/RD on 360 peripheral exam Attached          IMAGING AND PROCEDURES  Imaging and Procedures for 07/17/17  OCT, Retina - OU - Both Eyes       Right Eye Quality was good. Central Foveal Thickness: 304. Progression has been stable. Findings include no SRF, no IRF, normal foveal contour (+CME/IRF temporal fovea - stably resolved).   Left Eye Quality was good. Central Foveal Thickness: 277. Progression has been stable. Findings include normal foveal contour, no IRF, no SRF.   Notes *Images captured and stored on drive  Diagnosis / Impression:  OD: NFP; no IRF/SRF; CME stably resolved OS: NFP, no IRF/SRF    Clinical management:  See below  Abbreviations: NFP - Normal foveal profile. CME - cystoid macular edema. PED - pigment epithelial detachment. IRF - intraretinal fluid. SRF - subretinal fluid. EZ - ellipsoid zone. ERM - epiretinal membrane. ORA - outer retinal atrophy. ORT - outer retinal tubulation. SRHM - subretinal hyper-reflective material                  ASSESSMENT/PLAN:    ICD-10-CM   1. Cystoid macular edema of right eye  H35.351   2. Retinal edema  H35.81 OCT, Retina - OU - Both Eyes  3. Hx of retinal detachment  Z86.69   4. Posterior vitreous detachment of left eye  H43.812   5. Pseudophakia of  both eyes  Z96.1   6. PCO (posterior capsular opacification), left  H26.492   7. Ocular hypertension of right eye  H40.051   8. PCO (posterior capsular opacification), bilateral  H26.493     1,2. CME OD   - re-referred for recurrent CME after YAG OD with Dr. Herbert Deaner on 3.12.2020  - history of successful treatment for CME / Kathleen Argue OD Feb 2019 topically (PF and ketorolac)  - FA at initial visit on 02.28.19 with mild late petaloid leakage in fovea OD  - was started on PF QID and Prolensa TID OD by Dr. Herbert Deaner on 3.20 and was already improving at re-referral visit on 4.10.20             - today, delayed f/u but pt reports stable improvement in vision OD             - tapered off PF but has been using qdaily OD for about a week due to "butterfly" floater in vision             - BCVA remains 20/20 OD             - OCT shows complete resolution of CME OD             - IOP improved to 15 today - on Cosopt BID OD  - recommend stop PF and Cosopt             - f/u 2 wks for IOP and CME check OD   3. History of RD repair OD-   - S/P PPV/laser  by expert surgeon, Dr. Tempie Hoist (03.08.16)  - retina attached 360 with good 360 laser in place  - No RT/RD on repeat 360 peripheral exam today  - monitor  4. PVD / vitreous syneresis OS-   - Discussed findings and prognosis  - No RT or RD on 360 scleral depressed exam  - Reviewed s/s of RT/RD  - Strict return precautions for any such RT/RD signs/symptoms  5. Pseudophakia OU  - s/p CE/IOL OU (2016)  - s/p YAG OD  - history of [place] after both procedures  - beautiful surgeries  - monitor  6. PCO OS   - clear from a retina standpoint to proceed with YAG when pt and surgeon are ready  - may need additional anti-inflammatory drops post YAG given history of post op CME  7. Ocular hypertension OD  - IOP improved, 15 today OD  - likely steroid response  - discontinue PF  - discontinue cosopt  - f/u in 2-3 wks for IOP and CME  check    Ophthalmic Meds Ordered this visit:  No orders of the defined types were placed in this encounter.      Return in about 2 weeks (around 11/21/2018) for IOP and CME check OD -- DFE/OCT.  There are no Patient Instructions on file for this visit.   Explained the diagnoses, plan, and follow up with the patient and they expressed understanding.  Patient expressed understanding of the importance of proper follow up care.   This document serves as a record of services personally performed by Gardiner Sleeper, MD, PhD. It was created on their behalf by Ernest Mallick, OA, an ophthalmic assistant. The creation of this record is the provider's dictation and/or activities during the visit.    Electronically signed by: Ernest Mallick, OA  07.28.2020 11:38 PM     Gardiner Sleeper, M.D., Ph.D. Diseases & Surgery of the Retina and Vitreous [facility]  I have reviewed the above documentation for accuracy and completeness, and I agree with the above. Gardiner Sleeper, M.D., Ph.D. 11/07/18 11:38 PM    Abbreviations: M myopia (nearsighted); A astigmatism; H hyperopia (farsighted); P presbyopia; Mrx spectacle prescription;  CTL contact lenses; OD right eye; OS left eye; OU both eyes  XT exotropia; ET esotropia; PEK punctate epithelial keratitis; PEE punctate epithelial erosions; DES dry eye syndrome; MGD meibomian gland dysfunction; ATs artificial tears; PFAT's preservative free artificial tears; Raymond nuclear sclerotic cataract; PSC posterior subcapsular cataract; ERM epi-retinal membrane; PVD posterior vitreous detachment; RD retinal detachment; DM diabetes mellitus; DR diabetic retinopathy; NPDR non-proliferative diabetic retinopathy; PDR proliferative diabetic retinopathy; CSME clinically significant macular edema; DME diabetic macular edema; dbh dot blot hemorrhages; CWS cotton wool spot; POAG primary open angle glaucoma; C/D cup-to-disc ratio; HVF humphrey visual field; GVF  goldmann visual field; OCT optical coherence tomography; IOP intraocular pressure; BRVO Branch retinal vein occlusion; CRVO central retinal vein occlusion; CRAO central retinal artery occlusion; BRAO branch retinal artery occlusion; RT retinal tear; SB scleral buckle; PPV pars plana vitrectomy; VH Vitreous hemorrhage; PRP panretinal laser photocoagulation; IVK intravitreal kenalog; VMT vitreomacular traction; MH Macular hole;  NVD neovascularization of the disc; NVE neovascularization elsewhere; AREDS age related eye disease study; ARMD age related macular degeneration; POAG primary open angle glaucoma; EBMD epithelial/anterior basement membrane dystrophy; ACIOL anterior chamber intraocular lens; IOL intraocular lens; PCIOL posterior chamber intraocular lens; Phaco/IOL phacoemulsification with intraocular lens placement; PRK photorefractive keratectomy; LASIK laser assisted in situ keratomileusis; HTN hypertension;  DM diabetes mellitus; COPD chronic obstructive pulmonary disease

## 2018-11-07 ENCOUNTER — Ambulatory Visit (INDEPENDENT_AMBULATORY_CARE_PROVIDER_SITE_OTHER): Payer: Self-pay | Admitting: Ophthalmology

## 2018-11-07 ENCOUNTER — Other Ambulatory Visit: Payer: Self-pay

## 2018-11-07 ENCOUNTER — Encounter (INDEPENDENT_AMBULATORY_CARE_PROVIDER_SITE_OTHER): Payer: Self-pay | Admitting: Ophthalmology

## 2018-11-07 DIAGNOSIS — H3581 Retinal edema: Secondary | ICD-10-CM

## 2018-11-07 DIAGNOSIS — H43812 Vitreous degeneration, left eye: Secondary | ICD-10-CM

## 2018-11-07 DIAGNOSIS — Z8669 Personal history of other diseases of the nervous system and sense organs: Secondary | ICD-10-CM

## 2018-11-07 DIAGNOSIS — H40051 Ocular hypertension, right eye: Secondary | ICD-10-CM

## 2018-11-07 DIAGNOSIS — Z961 Presence of intraocular lens: Secondary | ICD-10-CM

## 2018-11-07 DIAGNOSIS — H26492 Other secondary cataract, left eye: Secondary | ICD-10-CM

## 2018-11-07 DIAGNOSIS — H35351 Cystoid macular degeneration, right eye: Secondary | ICD-10-CM

## 2018-11-19 NOTE — Progress Notes (Addendum)
[facility] Note  11/21/2018     CHIEF COMPLAINT Patient presents for Retina Follow Up   HISTORY OF PRESENT ILLNESS: [name] [name] is a 63 y.o. male who presents to the clinic today for:   HPI    Retina Follow Up    Patient presents with  Other.  In right eye.  This started 2 weeks ago.  Severity is moderate.  I, the attending physician,  performed the HPI with the patient and updated documentation appropriately.          Comments    Patient here for 2 weeks retinal follow up for CME OD. Patient states vision doing the same. No eye pain.       Last edited by Bernarda Caffey, MD on 11/21/2018  9:25 AM. (History)    pt states his vision is doing well, he states for 3-4 days after his last visit he saw a spider web floater in his vision   Referring physician: Monna Fam, MD [place],  [place] [id number]  HISTORICAL INFORMATION:   Selected notes from the [place] Referred by Dr. Zigmund Daniel for possible RD OD;  LEE- 06.14.16 (JDM) [BCVA OD: 20/40+2 OS: 20/20-1] Ocular Hx- S/P laser break OD, HTN ret OU, S/P 23/25g PPV, laser, C3F8 OD (03.08.16 -JDM), pseudophakia OU PMH- HTN    CURRENT MEDICATIONS: Current Outpatient Medications (Ophthalmic Drugs)  Medication Sig  . Difluprednate (DUREZOL) 0.05 % EMUL Place 1 drop into the right eye 4 (four) times daily. (Patient not taking: Reported on 08/29/2018)  . dorzolamide-timolol (COSOPT) 22.3-6.8 MG/ML ophthalmic solution Place 1 drop into the right eye 2 (two) times daily.  Marland Kitchen ketorolac (ACULAR) 0.5 % ophthalmic solution Place 1 drop into the right eye 4 (four) times daily. (Patient not taking: Reported on 05/27/2018)  . prednisoLONE acetate (PRED FORTE) 1 % ophthalmic suspension Place 1 drop into the right eye 4 (four) times daily. (Patient taking differently: Place 1 drop into the right eye daily. )   No current facility-administered medications for this visit.  (Ophthalmic  Drugs)   Current Outpatient Medications (Other)  Medication Sig  . lisinopril-hydrochlorothiazide (PRINZIDE,ZESTORETIC) 20-12.5 MG tablet Take 1 tablet by mouth daily.   Current Facility-Administered Medications (Other)  Medication Route  . 0.9 %  sodium chloride infusion Intravenous      REVIEW OF SYSTEMS: ROS    Positive for: Musculoskeletal, Eyes   Negative for: Constitutional, Gastrointestinal, Neurological, Skin, Genitourinary, HENT, Endocrine, Cardiovascular, Respiratory, Psychiatric, Allergic/Imm, Heme/Lymph   Last edited by Theodore Demark on 11/21/2018  8:57 AM. (History)       ALLERGIES No Known Allergies  PAST MEDICAL HISTORY Past Medical History:  Diagnosis Date  . Ankle fracture   . Cancer [facility]) 1974   testicular cancer-at age 52  . GERD (gastroesophageal reflux disease)    in the past  . Hx of colonic polyps    pt unsure,thinks this was done in 1982 in [place]. no reports in EPIC  . Hyperlipidemia   . Hypertension   . Kidney stones   . Patella fracture   . Retinal detachment    OD  . Retinal tear of right eye   . Syncopal episodes 2008   Past Surgical History:  Procedure Laterality Date  . AIR/FLUID EXCHANGE Right 06/16/2014   Procedure: AIR/FLUID EXCHANGE;  Surgeon: Hayden Pedro, MD;  Location: [place];  Service: Ophthalmology;  Laterality: Right;  . CATARACT EXTRACTION    .  CATARACT EXTRACTION  09/2014   rt eye//left 01/2015  . CATARACT EXTRACTION, BILATERAL  2016   per Dr. Hecker   . COLONOSCOPY  10-21-12   per Dr. Patterson, adenomatous polyp, repeat in 3 yrs   . EYE SURGERY    . GAS INSERTION Right 06/16/2014   Procedure: INSERTION OF GAS;  Surgeon: John D Matthews, MD;  Location: [facility] OR;  Service: Ophthalmology;  Laterality: Right;  C3F8  . HYDROCELE EXCISION    . KNEE ARTHROSCOPY Right    x 2  . LASER PHOTO ABLATION Right 06/16/2014   Procedure: LASER PHOTO ABLATION;  Surgeon: John D Matthews, MD;  Location: [facility] OR;  Service: Ophthalmology;   Laterality: Right;  Headscope laser and endolaser   . ORCHIECTOMY     right testicle  . REPAIR OF COMPLEX TRACTION RETINAL DETACHMENT Right 06/16/2014   Procedure: REPAIR OF COMPLEX TRACTION RETINAL DETACHMENT;  Surgeon: John D Matthews, MD;  Location: [facility] OR;  Service: Ophthalmology;  Laterality: Right;  . RETINAL DETACHMENT SURGERY  06/2014  . TONSILLECTOMY    . VITRECTOMY Right 06/16/2014    FAMILY HISTORY Family History  Problem Relation Age of Onset  . Parkinson's disease Mother   . Arrhythmia Father   . Coronary artery disease Other        fhx  . Hypertension Other        fhx  . Sudden death Other        fhx  . Colon cancer Neg Hx     SOCIAL HISTORY Social History   Tobacco Use  . Smoking status: Never Smoker  . Smokeless tobacco: Never Used  Substance Use Topics  . Alcohol use: Yes    Alcohol/week: 0.0 standard drinks    Comment: 2 beers yearly  . Drug use: No         OPHTHALMIC EXAM:  Base Eye Exam    Visual Acuity (Snellen - Linear)      Right Left   Dist [place] 20/30 -2 20/20   Dist ph [place] 20/20        Tonometry (Tonopen, 8:55 AM)      Right Left   Pressure 15 10       Pupils      Dark Light Shape React APD   Right 4 3 Round Brisk None   Left 3 2 Round Brisk None       Visual Fields (Counting fingers)      Left Right    Full Full       Extraocular Movement      Right Left    Full, Ortho Full, Ortho       Neuro/Psych    Oriented x3: Yes   Mood/Affect: Normal       Dilation    Both eyes: 1.0% Mydriacyl, 2.5% Phenylephrine @ 8:55 AM        Slit Lamp and Fundus Exam    Slit Lamp Exam      Right Left   Lids/Lashes Dermatochalasis - upper lid, Telangiectasia, Meibomian gland dysfunction Dermatochalasis - upper lid, Telangiectasia, Meibomian gland dysfunction   Conjunctiva/Sclera White and quiet White and quiet   Cornea mild arcus, 1+ Punctate epithelial erosions, Debris in tear film mild arcus, 2+ inferior Punctate epithelial erosions    Anterior Chamber Deep and quiet, no cell/flare Deep and quiet, no cell or flare   Iris Round and dilated Round and dilated   Lens PC IOL in good position with open PC PC IOL in   good position, 1+PCO   Vitreous Vitreous syneresis, mild vitreous debris settling inferiorly Vitreous syneresis, Posterior vitreous detachment, vitreous condensations       Fundus Exam      Right Left   Disc Pink and Sharp Pink and Sharp   C/D Ratio 0.2 0.2   Macula Flat, Good foveal reflex, CME stably resolved, no heme Flat, Good foveal reflex, No heme or edema   Vessels Vascular attenuation, mild Tortuousity Vascular attenuation, mild Tortuousity   Periphery Attached 360, 360 laser scarring, No RT/RD on 360 peripheral exam Attached          IMAGING AND PROCEDURES  Imaging and Procedures for 07/17/17  OCT, Retina - OU - Both Eyes       Right Eye Quality was good. Central Foveal Thickness: 313. Progression has been stable. Findings include no SRF, no IRF, normal foveal contour (+CME/IRF temporal fovea - stably resolved).   Left Eye Quality was good. Central Foveal Thickness: 280. Progression has been stable. Findings include normal foveal contour, no IRF, no SRF.   Notes *Images captured and stored on drive  Diagnosis / Impression:  OD: NFP; no IRF/SRF; CME stably resolved OS: NFP, no IRF/SRF  Clinical management:  See below  Abbreviations: NFP - Normal foveal profile. CME - cystoid macular edema. PED - pigment epithelial detachment. IRF - intraretinal fluid. SRF - subretinal fluid. EZ - ellipsoid zone. ERM - epiretinal membrane. ORA - outer retinal atrophy. ORT - outer retinal tubulation. SRHM - subretinal hyper-reflective material                  ASSESSMENT/PLAN:    ICD-10-CM   1. Cystoid macular edema of right eye  H35.351   2. Retinal edema  H35.81 OCT, Retina - OU - Both Eyes  3. Hx of retinal detachment  Z86.69   4. Posterior vitreous detachment of left eye  H43.812   5.  Pseudophakia of both eyes  Z96.1   6. PCO (posterior capsular opacification), left  H26.492   7. Ocular hypertension of right eye  H40.051     1,2. CME OD   - re-referred for recurrent CME after YAG OD with Dr. Hecker on 3.12.2020  - history of successful treatment for CME / [place] Gass OD Feb 2019 topically (PF and ketorolac)  - FA at initial visit on 02.28.19 with mild late petaloid leakage in fovea OD  - was started on PF QID and Prolensa TID OD by Dr. Hecker on 3.20 and was already improving at re-referral visit on 4.10.20                    - today, OCT shows stable resolution of CME OD off PF and ketorolac             - BCVA remains 20/20 OD             - IOP 15 today off Cosopt  - clear from a retina standpoint for primary eye care f/u at [facility]  - f/u here PRN   3. History of RD repair OD-   - S/P PPV/laser by expert surgeon, Dr. John Matthews (03.08.16)  - retina attached 360 with good 360 laser in place  - No RT/RD on repeat 360 peripheral exam today  -monitor  4. PVD / vitreous syneresis OS-   - Discussed findings and prognosis  - No RT or RD on 360 scleral depressed exam  - Reviewed s/s of RT/RD  -   Strict return precautions for any such RT/RD signs/symptoms  5. Pseudophakia OU  - s/p CE/IOL OU (2016)  - s/p YAG OD 2020  - history of [place] Gass after both procedures  - beautiful surgeries  - monitor  6. PCO OS   - clear from a retina standpoint to proceed with YAG when pt and surgeon are ready  - may need additional anti-inflammatory drops post YAG given history of post op CME  7. Ocular hypertension OD -- improved  - IOP 15 today off Cosopt  - likely steroid response  - monitor    Ophthalmic Meds Ordered this visit:  No orders of the defined types were placed in this encounter.      Return if symptoms worsen or fail to improve.  There are no Patient Instructions on file for this visit.   Explained the diagnoses, plan, and follow up with  the patient and they expressed understanding.  Patient expressed understanding of the importance of proper follow up care.   This document serves as a record of services personally performed by Gardiner Sleeper, MD, PhD. It was created on their behalf by Roselee Nova, COMT. The creation of this record is the provider's dictation and/or activities during the visit.  Electronically signed by: Roselee Nova, COMT 11/21/18 11:06 AM   This document serves as a record of services personally performed by Gardiner Sleeper, MD, PhD. It was created on their behalf by Ernest Mallick, OA, an ophthalmic assistant. The creation of this record is the provider's dictation and/or activities during the visit.    Electronically signed by: Ernest Mallick, OA  08.13.2020 11:06 AM     Gardiner Sleeper, M.D., Ph.D. Diseases & Surgery of the Retina and Vitreous [facility]   I have reviewed the above documentation for accuracy and completeness, and I agree with the above. Gardiner Sleeper, M.D., Ph.D. 11/21/18 11:06 AM     Abbreviations: M myopia (nearsighted); A astigmatism; H hyperopia (farsighted); P presbyopia; Mrx spectacle prescription;  CTL contact lenses; OD right eye; OS left eye; OU both eyes  XT exotropia; ET esotropia; PEK punctate epithelial keratitis; PEE punctate epithelial erosions; DES dry eye syndrome; MGD meibomian gland dysfunction; ATs artificial tears; PFAT's preservative free artificial tears; [place] nuclear sclerotic cataract; PSC posterior subcapsular cataract; ERM epi-retinal membrane; PVD posterior vitreous detachment; RD retinal detachment; DM diabetes mellitus; DR diabetic retinopathy; NPDR non-proliferative diabetic retinopathy; PDR proliferative diabetic retinopathy; CSME clinically significant macular edema; DME diabetic macular edema; dbh dot blot hemorrhages; CWS cotton wool spot; POAG primary open angle glaucoma; C/D cup-to-disc ratio; HVF humphrey visual field; GVF  goldmann visual field; OCT optical coherence tomography; IOP intraocular pressure; BRVO Branch retinal vein occlusion; CRVO central retinal vein occlusion; CRAO central retinal artery occlusion; BRAO branch retinal artery occlusion; RT retinal tear; SB scleral buckle; PPV pars plana vitrectomy; VH Vitreous hemorrhage; PRP panretinal laser photocoagulation; IVK intravitreal kenalog; VMT vitreomacular traction; MH Macular hole;  NVD neovascularization of the disc; NVE neovascularization elsewhere; AREDS age related eye disease study; ARMD age related macular degeneration; POAG primary open angle glaucoma; EBMD epithelial/anterior basement membrane dystrophy; ACIOL anterior chamber intraocular lens; IOL intraocular lens; PCIOL posterior chamber intraocular lens; Phaco/IOL phacoemulsification with intraocular lens placement; [place] photorefractive keratectomy; LASIK laser assisted in situ keratomileusis; HTN hypertension; DM diabetes mellitus; COPD chronic obstructive pulmonary disease

## 2018-11-21 ENCOUNTER — Ambulatory Visit (INDEPENDENT_AMBULATORY_CARE_PROVIDER_SITE_OTHER): Payer: Self-pay | Admitting: Ophthalmology

## 2018-11-21 ENCOUNTER — Encounter (INDEPENDENT_AMBULATORY_CARE_PROVIDER_SITE_OTHER): Payer: Self-pay | Admitting: Ophthalmology

## 2018-11-21 ENCOUNTER — Other Ambulatory Visit: Payer: Self-pay

## 2018-11-21 ENCOUNTER — Other Ambulatory Visit: Payer: Self-pay | Admitting: Family Medicine

## 2018-11-21 DIAGNOSIS — H43812 Vitreous degeneration, left eye: Secondary | ICD-10-CM

## 2018-11-21 DIAGNOSIS — H40051 Ocular hypertension, right eye: Secondary | ICD-10-CM

## 2018-11-21 DIAGNOSIS — Z8669 Personal history of other diseases of the nervous system and sense organs: Secondary | ICD-10-CM

## 2018-11-21 DIAGNOSIS — Z961 Presence of intraocular lens: Secondary | ICD-10-CM

## 2018-11-21 DIAGNOSIS — I10 Essential (primary) hypertension: Secondary | ICD-10-CM

## 2018-11-21 DIAGNOSIS — H3581 Retinal edema: Secondary | ICD-10-CM

## 2018-11-21 DIAGNOSIS — H26492 Other secondary cataract, left eye: Secondary | ICD-10-CM

## 2018-11-21 DIAGNOSIS — H35351 Cystoid macular degeneration, right eye: Secondary | ICD-10-CM

## 2018-11-21 NOTE — Progress Notes (Signed)
[facility] Note  11/21/2018     CHIEF COMPLAINT Patient presents for Retina Follow Up   HISTORY OF PRESENT ILLNESS: [name] [name] is a 63 y.o. male who presents to the clinic today for:   HPI    Retina Follow Up    Patient presents with  Other.  In right eye.  This started 2 weeks ago.  Severity is moderate.  I, the attending physician,  performed the HPI with the patient and updated documentation appropriately.          Comments    Patient here for 2 weeks retinal follow up for CME OD. Patient states vision doing the same. No eye pain.       Last edited by Bernarda Caffey, MD on 11/21/2018  9:25 AM. (History)    pt states his vision is doing well, he states for 3-4 days after his last visit he saw a spider web floater in his vision   Referring physician: Monna Fam, MD [place],  [place] [id number]  HISTORICAL INFORMATION:   Selected notes from the [facility] Referred by Dr. Zigmund Daniel for possible RD OD;  LEE- 06.14.16 (JDM) [BCVA OD: 20/40+2 OS: 20/20-1] Ocular Hx- S/P laser break OD, HTN ret OU, S/P 23/25g PPV, laser, C3F8 OD (03.08.16 -JDM), pseudophakia OU PMH- HTN    CURRENT MEDICATIONS: Current Outpatient Medications (Ophthalmic Drugs)  Medication Sig  . Difluprednate (DUREZOL) 0.05 % EMUL Place 1 drop into the right eye 4 (four) times daily. (Patient not taking: Reported on 08/29/2018)  . dorzolamide-timolol (COSOPT) 22.3-6.8 MG/ML ophthalmic solution Place 1 drop into the right eye 2 (two) times daily.  Marland Kitchen ketorolac (ACULAR) 0.5 % ophthalmic solution Place 1 drop into the right eye 4 (four) times daily. (Patient not taking: Reported on 05/27/2018)  . prednisoLONE acetate (PRED FORTE) 1 % ophthalmic suspension Place 1 drop into the right eye 4 (four) times daily. (Patient taking differently: Place 1 drop into the right eye daily. )   No current facility-administered medications for this visit.  (Ophthalmic  Drugs)   Current Outpatient Medications (Other)  Medication Sig  . lisinopril-hydrochlorothiazide (PRINZIDE,ZESTORETIC) 20-12.5 MG tablet Take 1 tablet by mouth daily.   Current Facility-Administered Medications (Other)  Medication Route  . 0.9 %  sodium chloride infusion Intravenous      REVIEW OF SYSTEMS: ROS    Positive for: Musculoskeletal, Eyes   Negative for: Constitutional, Gastrointestinal, Neurological, Skin, Genitourinary, HENT, Endocrine, Cardiovascular, Respiratory, Psychiatric, Allergic/Imm, Heme/Lymph   Last edited by Theodore Demark on 11/21/2018  8:57 AM. (History)       ALLERGIES No Known Allergies  PAST MEDICAL HISTORY Past Medical History:  Diagnosis Date  . Ankle fracture   . Cancer [facility]) 1974   testicular cancer-at age 61  . GERD (gastroesophageal reflux disease)    in the past  . Hx of colonic polyps    pt unsure,thinks this was done in 1982 in [place]. no reports in EPIC  . Hyperlipidemia   . Hypertension   . Kidney stones   . Patella fracture   . Retinal detachment    OD  . Retinal tear of right eye   . Syncopal episodes 2008   Past Surgical History:  Procedure Laterality Date  . AIR/FLUID EXCHANGE Right 06/16/2014   Procedure: AIR/FLUID EXCHANGE;  Surgeon: Hayden Pedro, MD;  Location: [facility];  Service: Ophthalmology;  Laterality: Right;  . CATARACT EXTRACTION    .  CATARACT EXTRACTION  09/2014   rt eye//left 01/2015  . CATARACT EXTRACTION, BILATERAL  2016   per Dr. Herbert Deaner   . COLONOSCOPY  10-21-12   per Dr. Sharlett Iles, adenomatous polyp, repeat in 3 yrs   . EYE SURGERY    . GAS INSERTION Right 06/16/2014   Procedure: INSERTION OF GAS;  Surgeon: Hayden Pedro, MD;  Location: [facility];  Service: Ophthalmology;  Laterality: Right;  C3F8  . HYDROCELE EXCISION    . KNEE ARTHROSCOPY Right    x 2  . LASER PHOTO ABLATION Right 06/16/2014   Procedure: LASER PHOTO ABLATION;  Surgeon: Hayden Pedro, MD;  Location: [place];  Service: Ophthalmology;   Laterality: Right;  Headscope laser and endolaser   . ORCHIECTOMY     right testicle  . REPAIR OF COMPLEX TRACTION RETINAL DETACHMENT Right 06/16/2014   Procedure: REPAIR OF COMPLEX TRACTION RETINAL DETACHMENT;  Surgeon: Hayden Pedro, MD;  Location: [facility];  Service: Ophthalmology;  Laterality: Right;  . RETINAL DETACHMENT SURGERY  06/2014  . TONSILLECTOMY    . VITRECTOMY Right 06/16/2014    FAMILY HISTORY Family History  Problem Relation Age of Onset  . Parkinson's disease Mother   . Arrhythmia Father   . Coronary artery disease Other        fhx  . Hypertension Other        fhx  . Sudden death Other        fhx  . Colon cancer Neg Hx     SOCIAL HISTORY Social History   Tobacco Use  . Smoking status: Never Smoker  . Smokeless tobacco: Never Used  Substance Use Topics  . Alcohol use: Yes    Alcohol/week: 0.0 standard drinks    Comment: 2 beers yearly  . Drug use: No         OPHTHALMIC EXAM:  Base Eye Exam    Visual Acuity (Snellen - Linear)      Right Left   Dist [place] 20/30 -2 20/20   Dist ph [place] 20/20        Tonometry (Tonopen, 8:55 AM)      Right Left   Pressure 15 10       Pupils      Dark Light Shape React APD   Right 4 3 Round Brisk None   Left 3 2 Round Brisk None       Visual Fields (Counting fingers)      Left Right    Full Full       Extraocular Movement      Right Left    Full, Ortho Full, Ortho       Neuro/Psych    Oriented x3: Yes   Mood/Affect: Normal       Dilation    Both eyes: 1.0% Mydriacyl, 2.5% Phenylephrine @ 8:55 AM        Slit Lamp and Fundus Exam    Slit Lamp Exam      Right Left   Lids/Lashes Dermatochalasis - upper lid, Telangiectasia, Meibomian gland dysfunction Dermatochalasis - upper lid, Telangiectasia, Meibomian gland dysfunction   Conjunctiva/Sclera White and quiet White and quiet   Cornea mild arcus, 1+ Punctate epithelial erosions, Debris in tear film mild arcus, 2+ inferior Punctate epithelial erosions    Anterior Chamber Deep and quiet, no cell/flare Deep and quiet, no cell or flare   Iris Round and dilated Round and dilated   Lens PC IOL in good position with open PC PC IOL in  good position, 1+PCO   Vitreous Vitreous syneresis, mild vitreous debris settling inferiorly Vitreous syneresis, Posterior vitreous detachment, vitreous condensations       Fundus Exam      Right Left   Disc Pink and Sharp Pink and Sharp   C/D Ratio 0.2 0.2   Macula Flat, Good foveal reflex, CME stably resolved, no heme Flat, Good foveal reflex, No heme or edema   Vessels Vascular attenuation, mild Tortuousity Vascular attenuation, mild Tortuousity   Periphery Attached 360, 360 laser scarring, No RT/RD on 360 peripheral exam Attached          IMAGING AND PROCEDURES  Imaging and Procedures for 07/17/17  OCT, Retina - OU - Both Eyes       Right Eye Quality was good. Central Foveal Thickness: 313. Progression has been stable. Findings include no SRF, no IRF, normal foveal contour (+CME/IRF temporal fovea - stably resolved).   Left Eye Quality was good. Central Foveal Thickness: 280. Progression has been stable. Findings include normal foveal contour, no IRF, no SRF.   Notes *Images captured and stored on drive  Diagnosis / Impression:  OD: NFP; no IRF/SRF; CME stably resolved OS: NFP, no IRF/SRF  Clinical management:  See below  Abbreviations: NFP - Normal foveal profile. CME - cystoid macular edema. PED - pigment epithelial detachment. IRF - intraretinal fluid. SRF - subretinal fluid. EZ - ellipsoid zone. ERM - epiretinal membrane. ORA - outer retinal atrophy. ORT - outer retinal tubulation. SRHM - subretinal hyper-reflective material                  ASSESSMENT/PLAN:    ICD-10-CM   1. Cystoid macular edema of right eye  H35.351   2. Retinal edema  H35.81 OCT, Retina - OU - Both Eyes  3. Hx of retinal detachment  Z86.69   4. Posterior vitreous detachment of left eye  H43.812   5.  Pseudophakia of both eyes  Z96.1   6. PCO (posterior capsular opacification), left  H26.492   7. Ocular hypertension of right eye  H40.051     1,2. CME OD   - re-referred for recurrent CME after YAG OD with Dr. Herbert Deaner on 3.12.2020  - history of successful treatment for CME / Kathleen Argue OD Feb 2019 topically (PF and ketorolac)  - FA at initial visit on 02.28.19 with mild late petaloid leakage in fovea OD  - was started on PF QID and Prolensa TID OD by Dr. Herbert Deaner on 3.20 and was already improving at re-referral visit on 4.10.20                    - today, OCT shows stable resolution of CME OD off PF and ketorolac             - BCVA remains 20/20 OD             - IOP 15 today off Cosopt  - clear from a retina standpoint for primary eye care f/u at [facility]  - f/u here PRN   3. History of RD repair OD-   - S/P PPV/laser by expert surgeon, Dr. Tempie Hoist (03.08.16)  - retina attached 360 with good 360 laser in place  - No RT/RD on repeat 360 peripheral exam today  -monitor  4. PVD / vitreous syneresis OS-   - Discussed findings and prognosis  - No RT or RD on 360 scleral depressed exam  - Reviewed s/s of RT/RD  -  Strict return precautions for any such RT/RD signs/symptoms  5. Pseudophakia OU  - s/p CE/IOL OU (2016)  - s/p YAG OD 2020  - history of [place] after both procedures  - beautiful surgeries  - monitor  6. PCO OS   - clear from a retina standpoint to proceed with YAG when pt and surgeon are ready  - may need additional anti-inflammatory drops post YAG given history of post op CME  7. Ocular hypertension OD -- improved  - IOP 15 today off Cosopt  - likely steroid response  - monitor    Ophthalmic Meds Ordered this visit:  No orders of the defined types were placed in this encounter.      Return if symptoms worsen or fail to improve.  There are no Patient Instructions on file for this visit.   Explained the diagnoses, plan, and follow up with  the patient and they expressed understanding.  Patient expressed understanding of the importance of proper follow up care.   This document serves as a record of services personally performed by Gardiner Sleeper, MD, PhD. It was created on their behalf by Roselee Nova, COMT. The creation of this record is the provider's dictation and/or activities during the visit.  Electronically signed by: Roselee Nova, COMT 11/21/18 11:06 AM   This document serves as a record of services personally performed by Gardiner Sleeper, MD, PhD. It was created on their behalf by Ernest Mallick, OA, an ophthalmic assistant. The creation of this record is the provider's dictation and/or activities during the visit.    Electronically signed by: Ernest Mallick, OA  08.13.2020 11:06 AM     Gardiner Sleeper, M.D., Ph.D. Diseases & Surgery of the Retina and Vitreous [facility]   I have reviewed the above documentation for accuracy and completeness, and I agree with the above. Gardiner Sleeper, M.D., Ph.D. 11/21/18 11:06 AM     Abbreviations: M myopia (nearsighted); A astigmatism; H hyperopia (farsighted); P presbyopia; Mrx spectacle prescription;  CTL contact lenses; OD right eye; OS left eye; OU both eyes  XT exotropia; ET esotropia; PEK punctate epithelial keratitis; PEE punctate epithelial erosions; DES dry eye syndrome; MGD meibomian gland dysfunction; ATs artificial tears; PFAT's preservative free artificial tears; [place] nuclear sclerotic cataract; PSC posterior subcapsular cataract; ERM epi-retinal membrane; PVD posterior vitreous detachment; RD retinal detachment; DM diabetes mellitus; DR diabetic retinopathy; NPDR non-proliferative diabetic retinopathy; PDR proliferative diabetic retinopathy; CSME clinically significant macular edema; DME diabetic macular edema; dbh dot blot hemorrhages; CWS cotton wool spot; POAG primary open angle glaucoma; C/D cup-to-disc ratio; HVF humphrey visual field; GVF  goldmann visual field; OCT optical coherence tomography; IOP intraocular pressure; BRVO Branch retinal vein occlusion; CRVO central retinal vein occlusion; CRAO central retinal artery occlusion; BRAO branch retinal artery occlusion; RT retinal tear; SB scleral buckle; PPV pars plana vitrectomy; VH Vitreous hemorrhage; PRP panretinal laser photocoagulation; IVK intravitreal kenalog; VMT vitreomacular traction; MH Macular hole;  NVD neovascularization of the disc; NVE neovascularization elsewhere; AREDS age related eye disease study; ARMD age related macular degeneration; POAG primary open angle glaucoma; EBMD epithelial/anterior basement membrane dystrophy; ACIOL anterior chamber intraocular lens; IOL intraocular lens; PCIOL posterior chamber intraocular lens; Phaco/IOL phacoemulsification with intraocular lens placement; [place] photorefractive keratectomy; LASIK laser assisted in situ keratomileusis; HTN hypertension; DM diabetes mellitus; COPD chronic obstructive pulmonary disease

## 2018-12-04 ENCOUNTER — Telehealth: Payer: Self-pay | Admitting: Family Medicine

## 2018-12-04 DIAGNOSIS — I10 Essential (primary) hypertension: Secondary | ICD-10-CM

## 2018-12-04 MED ORDER — LISINOPRIL-HYDROCHLOROTHIAZIDE 20-12.5 MG PO TABS: 1.0000 | ORAL_TABLET | Freq: Every day | ORAL | 0 refills | Status: DC

## 2018-12-04 NOTE — Telephone Encounter (Signed)
Medication Refill - Medication: lisinopril-hydrochlorothiazide (PRINZIDE,ZESTORETIC) 20-12.5 MG tablet   Has the patient contacted their pharmacy? No. (Agent: If no, request that the patient contact the pharmacy for the refill.) (Agent: If yes, when and what did the pharmacy advise?)  Preferred Pharmacy (with phone number or street name):  [facility] [id number] - [place], [place] [phone] (Phone) [phone] (Fax)     Agent: Please be advised that RX refills may take up to 3 business days. We ask that you follow-up with your pharmacy.

## 2018-12-04 NOTE — Telephone Encounter (Signed)
Pt stated he would like a 3 or 6 month supply until he can get in for his full CPE when covid-19 virus settles / please advise

## 2019-07-09 ENCOUNTER — Other Ambulatory Visit: Payer: Self-pay

## 2019-07-10 ENCOUNTER — Encounter: Payer: Self-pay | Admitting: Family Medicine

## 2019-07-10 ENCOUNTER — Other Ambulatory Visit: Payer: Self-pay

## 2019-07-10 ENCOUNTER — Ambulatory Visit (INDEPENDENT_AMBULATORY_CARE_PROVIDER_SITE_OTHER): Payer: Medicare Other | Admitting: Family Medicine

## 2019-07-10 VITALS — BP 152/92 | HR 69 | Temp 96.5°F | Ht 71.0 in | Wt 231.1 lb

## 2019-07-10 DIAGNOSIS — I700 Atherosclerosis of aorta: Secondary | ICD-10-CM | POA: Diagnosis not present

## 2019-07-10 DIAGNOSIS — I10 Essential (primary) hypertension: Secondary | ICD-10-CM | POA: Diagnosis not present

## 2019-07-10 DIAGNOSIS — Z1159 Encounter for screening for other viral diseases: Secondary | ICD-10-CM

## 2019-07-10 LAB — COMPREHENSIVE METABOLIC PANEL
ALT: 14 U/L (ref 0–53)
AST: 17 U/L (ref 0–37)
Albumin: 4.3 g/dL (ref 3.5–5.2)
Alkaline Phosphatase: 65 U/L (ref 39–117)
BUN: 20 mg/dL (ref 6–23)
CO2: 26 mEq/L (ref 19–32)
Calcium: 9.1 mg/dL (ref 8.4–10.5)
Chloride: 105 mEq/L (ref 96–112)
Creatinine, Ser: 1.02 mg/dL (ref 0.40–1.50)
GFR: 73.31 mL/min (ref >60.00)
Glucose, Bld: 94 mg/dL (ref 70–99)
Potassium: 4.3 mEq/L (ref 3.5–5.1)
Sodium: 138 mEq/L (ref 135–145)
Total Bilirubin: 1 mg/dL (ref 0.2–1.2)
Total Protein: 6.4 g/dL (ref 6.0–8.3)

## 2019-07-10 LAB — LIPID PANEL
Cholesterol: 181 mg/dL (ref 0–200)
HDL: 30.5 mg/dL — ABNORMAL LOW (ref >39.00)
LDL Cholesterol: 117 mg/dL — ABNORMAL HIGH (ref 0–99)
NonHDL: 150.37
Total CHOL/HDL Ratio: 6
Triglycerides: 165 mg/dL — ABNORMAL HIGH (ref 0.0–149.0)
VLDL: 33 mg/dL (ref 0.0–40.0)

## 2019-07-10 MED ORDER — LISINOPRIL-HYDROCHLOROTHIAZIDE 20-12.5 MG PO TABS: 1.0000 | ORAL_TABLET | Freq: Every day | ORAL | 0 refills | Status: DC

## 2019-07-10 MED ORDER — ROSUVASTATIN CALCIUM 20 MG PO TABS: 20.0000 mg | ORAL_TABLET | Freq: Every day | ORAL | 3 refills | Status: DC

## 2019-07-10 MED ORDER — AMLODIPINE BESYLATE 5 MG PO TABS: 5.0000 mg | ORAL_TABLET | Freq: Every day | ORAL | 3 refills | Status: DC

## 2019-07-10 NOTE — Progress Notes (Signed)
Chief Complaint  Patient presents with  . Annual Exam    Subjective [name] [name] is a 64 y.o. male who presents for hypertension follow up. He does monitor home blood pressures. Blood pressures ranging from 130-160's/80-90's's on average. He is compliant with medication- Prinzide 20-12.5 mg/d. Patient has these side effects of medication: none He is usually adhering to a healthy diet overall. Current exercise: tennis, some golf   Pt had CT scan earlier in year for kidney stone. Showed aortic atherosclerosis. Not on a statin. Diet/exercise as above. No chest pain/sob.   Past Medical History:  Diagnosis Date  . Ankle fracture   . Cancer [facility]) 1974   testicular cancer-at age 70  . GERD (gastroesophageal reflux disease)    in the past  . Hx of colonic polyps    pt unsure,thinks this was done in 1982 in [place]. no reports in EPIC  . Hyperlipidemia   . Hypertension   . Kidney stones   . Patella fracture   . Retinal detachment    OD  . Retinal tear of right eye   . Syncopal episodes 2008   Exam BP (!) 152/92 (BP Location: Left Arm, Patient Position: Sitting, Cuff Size: Normal)   Pulse 69   Temp (!) 96.5 F (35.8 C) (Temporal)   Ht 5\' 11"  (1.803 m)   Wt 231 lb 2 oz (104.8 kg)   SpO2 98%   BMI 32.24 kg/m  General:  well developed, well nourished, in no apparent distress Heart: RRR, no bruits, no LE edema Lungs: clear to auscultation, no accessory muscle use Psych: well oriented with normal range of affect and appropriate judgment/insight  Aortic atherosclerosis (HCC) - Plan: Comprehensive metabolic panel, Lipid panel, rosuvastatin (CRESTOR) 20 MG tablet  Encounter for hepatitis C screening test for low risk patient - Plan: Hepatitis C antibody  Essential hypertension - Plan: amLODipine (NORVASC) 5 MG tablet, lisinopril-hydrochlorothiazide (ZESTORETIC) 20-12.5 MG tablet  1- Start statin. NO signs of CAD, hold on cards referral. Counseled on diet and exercise. 2- Screen 3-  Cont Prinzide. Add Norvasc. Cont checking BP at home.  F/u in 1 mo. The patient voiced understanding and agreement to the plan.  [place], DO 07/10/19  9:32 AM

## 2019-07-10 NOTE — Patient Instructions (Addendum)
Give [place] 2-3 business days to get the results of your labs back.   Keep the diet clean and stay active.  Continue to check your blood pressure at home.  Let me know if there are cost issues.   Let us know if you need anything.

## 2019-07-11 LAB — HEPATITIS C ANTIBODY
Hepatitis C Ab: NONREACTIVE
SIGNAL TO CUT-OFF: 0.01 (ref <1.00)

## 2019-08-13 ENCOUNTER — Ambulatory Visit (INDEPENDENT_AMBULATORY_CARE_PROVIDER_SITE_OTHER): Payer: Medicare Other | Admitting: Family Medicine

## 2019-08-13 ENCOUNTER — Encounter: Payer: Self-pay | Admitting: Family Medicine

## 2019-08-13 ENCOUNTER — Other Ambulatory Visit: Payer: Self-pay

## 2019-08-13 VITALS — BP 130/80 | HR 65 | Temp 95.3°F | Ht 71.0 in | Wt 228.4 lb

## 2019-08-13 DIAGNOSIS — L989 Disorder of the skin and subcutaneous tissue, unspecified: Secondary | ICD-10-CM | POA: Diagnosis not present

## 2019-08-13 DIAGNOSIS — I10 Essential (primary) hypertension: Secondary | ICD-10-CM

## 2019-08-13 DIAGNOSIS — M25551 Pain in right hip: Secondary | ICD-10-CM | POA: Diagnosis not present

## 2019-08-13 MED ORDER — AMLODIPINE BESYLATE 5 MG PO TABS: 5.0000 mg | ORAL_TABLET | Freq: Every day | ORAL | 2 refills | Status: DC

## 2019-08-13 NOTE — Progress Notes (Signed)
Chief Complaint  Patient presents with  . Follow-up    Subjective [name] [name] is a 64 y.o. male who presents for hypertension follow up. He does not monitor home blood pressures. He is compliant with medications- Zestoretic 20-12.5 mg/d, Norvasc 5 mg/d. Patient has these side effects of medication: none He is adhering to a healthy diet overall. Current exercise: tennis and golf  2 mo of a sore on the R side of face that has not healed. He has a hx of BCC. No pain or itching. Wishes to see a specialist. Also notes a rash on his forearms for 1 year that neither hurts nor itches. It is not changes and nothing seems to affect it.   A couple years of intermittent R hip pain. No redness, swelling, bruising. Lasts for a few min when he walks slowly and resolves quickly when he sits down/rests. It will cause him to limp. He has not tried anything because it resolves so quickly. It is happening more often. No groin pain or pain over greater troch. He states it feels like a hip pointer, but he denies trauma and there was no preceding change in footwear or activity.    Past Medical History:  Diagnosis Date  . Ankle fracture   . Cancer [facility]) 1974   testicular cancer-at age 53  . GERD (gastroesophageal reflux disease)    in the past  . Hx of colonic polyps    pt unsure,thinks this was done in 1982 in [place]. no reports in EPIC  . Hyperlipidemia   . Hypertension   . Kidney stones   . Patella fracture   . Retinal detachment    OD  . Retinal tear of right eye   . Syncopal episodes 2008    Exam BP 130/80 (BP Location: Left Arm, Patient Position: Sitting, Cuff Size: Normal)   Pulse 65   Temp (!) 95.3 F (35.2 C) (Temporal)   Ht 5\' 11"  (1.803 m)   Wt 228 lb 6 oz (103.6 kg)   SpO2 94%   BMI 31.85 kg/m  General:  well developed, well nourished, in no apparent distress Heart: RRR, no bruits, no LE edema Lungs: clear to auscultation, no accessory muscle use MSK: No ttp over greater troch or  iliac crest; neg [place], FABER, FADDIR, log roll on R Neuro: Gait normal Skin: See below; on anterior forearms, there is a salmon colored patch that does blanch. There are some macules on the edges with some confluens. It is not ttp or warm compared to surrounding skin. It is not raised or scaled.  Psych: well oriented with normal range of affect and appropriate judgment/insight   R side of face  Essential hypertension - Plan: amLODipine (NORVASC) 5 MG tablet  Skin lesion - Plan: Ambulatory referral to Dermatology  Right hip pain - Plan: Ambulatory referral to Sports Medicine  1- Cont meds. OK to stop checking BP. Counseled on diet and exercise. 2- Refer to derm, likely SCC. 3- Refer to sports med. Not bursitis or tendinitis of glutes, unlikely to be OA. [place]? F/u in 6 mo, will ck lipids and CMP. The patient voiced understanding and agreement to the plan.  [place], DO 08/13/19  7:27 AM

## 2019-08-13 NOTE — Patient Instructions (Addendum)
If you do not hear anything about your referrals in the next 1-2 weeks, call our office and ask for an update.  Keep the diet clean and stay active.  Ice/cold pack over area for 10-15 min twice daily.  Heat (pad or rice pillow in microwave) over affected area, 10-15 minutes twice daily.   Let us know if you need anything.

## 2019-08-18 ENCOUNTER — Other Ambulatory Visit: Payer: Self-pay

## 2019-08-18 ENCOUNTER — Encounter: Payer: Self-pay | Admitting: Family Medicine

## 2019-08-18 ENCOUNTER — Ambulatory Visit (INDEPENDENT_AMBULATORY_CARE_PROVIDER_SITE_OTHER): Payer: Medicare Other | Admitting: Family Medicine

## 2019-08-18 VITALS — BP 159/83 | HR 66 | Ht 71.0 in | Wt 225.0 lb

## 2019-08-18 DIAGNOSIS — M25851 Other specified joint disorders, right hip: Secondary | ICD-10-CM | POA: Diagnosis present

## 2019-08-18 NOTE — Progress Notes (Signed)
[name] [name] - 64 y.o. male MRN [id number]  Date of birth: 09/25/1955  SUBJECTIVE:  Including CC & ROS.  Chief Complaint  Patient presents with  . Hip Pain    right     [name] [name] is a 64 y.o. male that is presenting with acute on chronic right hip pain.  The pain seems to be occurring over the proximal lateral portion of the hip.  It is a stabbing sensation.  It only occurs when he is in certain movements.  Will go away when he sits down.  No history of surgery or similar pain..  Independent review of the CT pelvis from 2007 shows cam lesions of the left and right femoral head.   Review of Systems See HPI   HISTORY: Past Medical, Surgical, Social, and Family History Reviewed & Updated per EMR.   Pertinent Historical Findings include:  Past Medical History:  Diagnosis Date  . Ankle fracture   . Cancer [facility]) 1974   testicular cancer-at age 77  . GERD (gastroesophageal reflux disease)    in the past  . Hx of colonic polyps    pt unsure,thinks this was done in 1982 in [place]. no reports in EPIC  . Hyperlipidemia   . Hypertension   . Kidney stones   . Patella fracture   . Retinal detachment    OD  . Retinal tear of right eye   . Syncopal episodes 2008    Past Surgical History:  Procedure Laterality Date  . AIR/FLUID EXCHANGE Right 06/16/2014   Procedure: AIR/FLUID EXCHANGE;  Surgeon: Hayden Pedro, MD;  Location: [place];  Service: Ophthalmology;  Laterality: Right;  . CATARACT EXTRACTION    . CATARACT EXTRACTION  09/2014   rt eye//left 01/2015  . CATARACT EXTRACTION, BILATERAL  2016   per Dr. Herbert Deaner   . COLONOSCOPY  10-21-12   per Dr. Sharlett Iles, adenomatous polyp, repeat in 3 yrs   . EYE SURGERY    . GAS INSERTION Right 06/16/2014   Procedure: INSERTION OF GAS;  Surgeon: Hayden Pedro, MD;  Location: [place];  Service: Ophthalmology;  Laterality: Right;  C3F8  . HYDROCELE EXCISION    . KNEE ARTHROSCOPY Right    x 2  . LASER PHOTO ABLATION Right 06/16/2014   Procedure:  LASER PHOTO ABLATION;  Surgeon: Hayden Pedro, MD;  Location: [place];  Service: Ophthalmology;  Laterality: Right;  Headscope laser and endolaser   . ORCHIECTOMY     right testicle  . REPAIR OF COMPLEX TRACTION RETINAL DETACHMENT Right 06/16/2014   Procedure: REPAIR OF COMPLEX TRACTION RETINAL DETACHMENT;  Surgeon: Hayden Pedro, MD;  Location: [place];  Service: Ophthalmology;  Laterality: Right;  . RETINAL DETACHMENT SURGERY  06/2014  . TONSILLECTOMY    . VITRECTOMY Right 06/16/2014    Family History  Problem Relation Age of Onset  . Parkinson's disease Mother   . Arrhythmia Father   . Coronary artery disease Other        fhx  . Hypertension Other        fhx  . Sudden death Other        fhx  . Colon cancer Neg Hx     Social History   Socioeconomic History  . Marital status: Married    Spouse name: Not on file  . Number of children: Not on file  . Years of education: Not on file  . Highest education level: Not on file  Occupational History  .  Occupation: Solicitor: [employer]  Tobacco Use  . Smoking status: Never Smoker  . Smokeless tobacco: Never Used  Substance and Sexual Activity  . Alcohol use: Yes    Alcohol/week: 0.0 standard drinks    Comment: 2 beers yearly  . Drug use: No  . Sexual activity: Not on file  Other Topics Concern  . Not on file  Social History Narrative  . Not on file   Social Determinants of Health   Financial Resource Strain:   . Difficulty of Paying Living Expenses:   Food Insecurity:   . Worried About Charity fundraiser in the Last Year:   . Arboriculturist in the Last Year:   Transportation Needs:   . Film/video editor (Medical):   Marland Kitchen Lack of Transportation (Non-Medical):   Physical Activity:   . Days of Exercise per Week:   . Minutes of Exercise per Session:   Stress:   . Feeling of Stress :   Social Connections:   . Frequency of Communication with Friends and Family:   . Frequency of Social  Gatherings with Friends and Family:   . Attends Religious Services:   . Active Member of Clubs or Organizations:   . Attends Archivist Meetings:   Marland Kitchen Marital Status:   Intimate Partner Violence:   . Fear of Current or Ex-Partner:   . Emotionally Abused:   Marland Kitchen Physically Abused:   . Sexually Abused:      PHYSICAL EXAM:  VS: BP (!) 159/83   Pulse 66   Ht 5\' 11"  (1.803 m)   Wt 225 lb (102.1 kg)   BMI 31.38 kg/m  Physical Exam Gen: NAD, alert, cooperative with exam, well-appearing MSK:  Right hand: No tenderness to palpation of the greater trochanter. Has good strength with hip flexion. Equivalent strength testing with hip abduction on the right and the left. Normal internal and external rotation. Negative straight leg raise. Neurovascularly intact     ASSESSMENT & PLAN:   Right hip impingement syndrome Right hip pain seems more impingement related.  Has a cam lesion observed on the CT from several years ago.  History was suggest that with alleviation of pain when sitting. -Counseled on home exercise therapy and supportive care. -Could consider injection or physical therapy. -Could consider updated imaging.

## 2019-08-18 NOTE — Assessment & Plan Note (Signed)
Right hip pain seems more impingement related.  Has a cam lesion observed on the CT from several years ago.  History was suggest that with alleviation of pain when sitting. -Counseled on home exercise therapy and supportive care. -Could consider injection or physical therapy. -Could consider updated imaging.

## 2019-08-18 NOTE — Patient Instructions (Signed)
Nice to meet you  Please try ice  Please try the exercises  You could try fish oil, glucosamine chondroitin or acid supplements Please send me a message in MyChart with any questions or updates.  Please see me back in 4 weeks or sooner if needed.   --Dr. Raeford Razor

## 2019-09-26 ENCOUNTER — Other Ambulatory Visit: Payer: Self-pay | Admitting: Family Medicine

## 2019-09-26 ENCOUNTER — Telehealth: Payer: Self-pay | Admitting: Family Medicine

## 2019-09-26 DIAGNOSIS — M25551 Pain in right hip: Secondary | ICD-10-CM

## 2019-09-26 NOTE — Telephone Encounter (Signed)
REFERRAL DONE

## 2019-09-26 NOTE — Progress Notes (Signed)
a 

## 2019-09-26 NOTE — Telephone Encounter (Signed)
Patient states he requested to be sent to [place] or [facility]  [facility] called to let us know patient refused appt with her

## 2019-09-26 NOTE — Telephone Encounter (Signed)
OK to refer.

## 2019-09-29 NOTE — Telephone Encounter (Signed)
Referral sent to [facility]

## 2019-09-30 ENCOUNTER — Other Ambulatory Visit: Payer: Self-pay | Admitting: Family Medicine

## 2019-09-30 DIAGNOSIS — I10 Essential (primary) hypertension: Secondary | ICD-10-CM

## 2019-10-07 ENCOUNTER — Ambulatory Visit (INDEPENDENT_AMBULATORY_CARE_PROVIDER_SITE_OTHER): Payer: Medicare Other | Admitting: Orthopaedic Surgery

## 2019-10-07 ENCOUNTER — Encounter: Payer: Self-pay | Admitting: Orthopaedic Surgery

## 2019-10-07 ENCOUNTER — Ambulatory Visit: Payer: Self-pay

## 2019-10-07 ENCOUNTER — Ambulatory Visit (INDEPENDENT_AMBULATORY_CARE_PROVIDER_SITE_OTHER): Payer: Medicare Other

## 2019-10-07 VITALS — Ht 71.0 in | Wt 225.0 lb

## 2019-10-07 DIAGNOSIS — M25551 Pain in right hip: Secondary | ICD-10-CM

## 2019-10-07 DIAGNOSIS — M25559 Pain in unspecified hip: Secondary | ICD-10-CM

## 2019-10-07 NOTE — Progress Notes (Signed)
Office Visit Note   Patient: [name] [name]           Date of Birth: 1956/02/24           MRN: [id number] Visit Date: 10/07/2019              Requested by: Shelda Pal, [address] [place],  [place] [address] PCP: Shelda Pal, DO   Assessment & Plan: Visit Diagnoses:  1. Hip pain     Plan: Impression is chronic right hip pain.  I am unable to elicit his pain or palpate his pain which she states is usually near the ASIS.  Given our discussion he has agreed to try cortisone injection today.  If this fails to give him significant relief we need to consider an MRI to rule out labral tear.  Follow-Up Instructions: Return if symptoms worsen or fail to improve.   Orders:  Orders Placed This Encounter  Procedures  . XR HIP UNILAT W OR W/O PELVIS 2-3 VIEWS RIGHT  . US Guided Needle Placement - No Linked Charges   No orders of the defined types were placed in this encounter.     Procedures: No procedures performed   Clinical Data: No additional findings.   Subjective: Chief Complaint  Patient presents with  . Right Hip - Pain, New Patient (Initial Visit)    [name] [name] is a 64 year old gentleman whose father I took care of a few years ago for hip fracture comes in for evaluation of right hip pain.  He states that when he walks in a straight line it is uncomfortable and makes him have to sit for a while and then the pain improves.  Feels like a sharp knife pain.  He has pain throughout the right hip.  This is been getting worse over the last 10 years.  Denies any numbness or tingling radiculopathy or low back pain.  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   Review of Systems  Constitutional: Negative.   All other systems reviewed and are negative.    Objective: Vital Signs: Ht 5\' 11"  (1.803 m)   Wt 225 lb (102.1 kg)   BMI 31.38 kg/m   Physical Exam Vitals and  nursing note reviewed.  Constitutional:      Appearance: He is well-developed.  HENT:     Head: Normocephalic and atraumatic.  Eyes:     Pupils: Pupils are equal, round, and reactive to light.  Pulmonary:     Effort: Pulmonary effort is normal.  Abdominal:     Palpations: Abdomen is soft.  Musculoskeletal:        General: Normal range of motion.     Cervical back: Neck supple.  Skin:    General: Skin is warm.  Neurological:     Mental Status: He is alert and oriented to person, place, and time.  Psychiatric:        Behavior: Behavior normal.        Thought Content: Thought content normal.        Judgment: Judgment normal.     Ortho Exam Right hip shows no significant pain with range of motion or Stinchfield or logroll.  No tenderness to palpation. Specialty Comments:  No specialty comments available.  Imaging: US Guided Needle Placement - No Linked Charges  Result Date: 10/07/2019 Ultrasound-guided right hip injection:  After sterile prep with Betadine, injected 8 cc 1% lidocaine without epinephrine and 40 mg methylprednisolone using a 22-gauge spinal needle, passing the needle through the iliofemoral ligament into the femoral head/neck junction.  Injectate was seen filling the joint capsule.  He had what appeared to be a possible tear of the anterior labrum.   XR HIP UNILAT W OR W/O PELVIS 2-3 VIEWS RIGHT  Result Date: 10/07/2019 Mild osteoarthritis.    PMFS History: Patient Active Problem List   Diagnosis Date Noted  . Right hip impingement syndrome 08/18/2019  . Aortic atherosclerosis ([place]) 07/10/2019  . Elevated hemoglobin ([place]) 09/13/2017  . 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 HEMATURIA UNSPECIFIED 12/03/2007  . Hyperlipidemia 10/01/2007  . Essential hypertension 10/01/2007  . NEOPLASM, MALIGNANT, TESTES, HX OF 10/01/2007  . COLONIC POLYPS, HX OF 10/01/2007  . NEPHROLITHIASIS, HX OF 10/01/2007   Past Medical History:  Diagnosis Date   . Ankle fracture   . Cancer [facility]) 1974   testicular cancer-at age 72  . GERD (gastroesophageal reflux disease)    in the past  . Hx of colonic polyps    pt unsure,thinks this was done in 1982 in [place]. no reports in EPIC  . Hyperlipidemia   . Hypertension   . Kidney stones   . Patella fracture   . Retinal detachment    OD  . Retinal tear of right eye   . Syncopal episodes 2008    Family History  Problem Relation Age of Onset  . Parkinson's disease Mother   . Arrhythmia Father   . Coronary artery disease Other        fhx  . Hypertension Other        fhx  . Sudden death Other        fhx  . Colon cancer Neg Hx     Past Surgical History:  Procedure Laterality Date  . AIR/FLUID EXCHANGE Right 06/16/2014   Procedure: AIR/FLUID EXCHANGE;  Surgeon: Hayden Pedro, MD;  Location: [place];  Service: Ophthalmology;  Laterality: Right;  . CATARACT EXTRACTION    . CATARACT EXTRACTION  09/2014   rt eye//left 01/2015  . CATARACT EXTRACTION, BILATERAL  2016   per Dr. Herbert Deaner   . COLONOSCOPY  10-21-12   per Dr. Sharlett Iles, adenomatous polyp, repeat in 3 yrs   . EYE SURGERY    . GAS INSERTION Right 06/16/2014   Procedure: INSERTION OF GAS;  Surgeon: Hayden Pedro, MD;  Location: [place];  Service: Ophthalmology;  Laterality: Right;  C3F8  . HYDROCELE EXCISION    . KNEE ARTHROSCOPY Right    x 2  . LASER PHOTO ABLATION Right 06/16/2014   Procedure: LASER PHOTO ABLATION;  Surgeon: Hayden Pedro, MD;  Location: [place];  Service: Ophthalmology;  Laterality: Right;  Headscope laser and endolaser   . ORCHIECTOMY     right testicle  . REPAIR OF COMPLEX TRACTION RETINAL DETACHMENT Right 06/16/2014   Procedure: REPAIR OF COMPLEX TRACTION RETINAL DETACHMENT;  Surgeon: Hayden Pedro, MD;  Location: [place];  Service: Ophthalmology;  Laterality: Right;  . RETINAL DETACHMENT SURGERY  06/2014  . TONSILLECTOMY    . VITRECTOMY Right 06/16/2014   Social History   Occupational History  . Occupation:  Solicitor: [employer]  Tobacco Use  . Smoking status: Never Smoker  . Smokeless tobacco: Never Used  Vaping Use  . Vaping Use: Never used  Substance and Sexual Activity  . Alcohol  use: Yes    Alcohol/week: 0.0 standard drinks    Comment: 2 beers yearly  . Drug use: No  . Sexual activity: Not on file

## 2019-10-07 NOTE — Progress Notes (Signed)
Subjective: Patient is here for ultrasound-guided intra-articular right hip injection.   This will be a diagnostic and hopefully therapeutic injection.  He has had pain for 10 years primarily when he walks straight.  He cannot reproduce it every time, but when it happens it is incapacitating for a few minutes until he stops and rests.  It does not bother him when he is playing tennis or doing other active things.  It has been very difficult to pinpoint.  Objective: Good range of motion and no significant pain today with passive flexion and internal rotation.  No tenderness to palpation today.  Procedure: Ultrasound-guided right hip injection: After sterile prep with Betadine, injected 8 cc 1% lidocaine without epinephrine and 40 mg methylprednisolone using a 22-gauge spinal needle, passing the needle through the iliofemoral ligament into the femoral head/neck junction.  Injectate was seen filling the joint capsule.  He had what appeared to be a possible tear of the anterior labrum.  If he fails to get any relief, consider MRI of the hip and possibly MRI lumbar spine to look for an upper lumbar disc herniation.

## 2019-10-23 ENCOUNTER — Telehealth: Payer: Self-pay | Admitting: Family Medicine

## 2019-10-23 NOTE — Telephone Encounter (Signed)
Pt was once your pt but transferred to another [place] but would like to come back.  Pt is aware that you are not taking any new or TOC pt.  Would you accept him back?

## 2019-10-23 NOTE — Telephone Encounter (Signed)
Pt is wanting to transfer to [facility] is okay for pt to do so?

## 2019-10-23 NOTE — Telephone Encounter (Signed)
Yes I would be happy to see him again

## 2019-10-23 NOTE — Telephone Encounter (Signed)
Yes, fine w me.

## 2019-11-11 ENCOUNTER — Other Ambulatory Visit: Payer: Self-pay

## 2019-11-11 ENCOUNTER — Ambulatory Visit (INDEPENDENT_AMBULATORY_CARE_PROVIDER_SITE_OTHER): Payer: Medicare Other | Admitting: Family Medicine

## 2019-11-11 ENCOUNTER — Encounter: Payer: Self-pay | Admitting: Family Medicine

## 2019-11-11 VITALS — BP 130/62 | HR 65 | Temp 99.2°F | Wt 233.8 lb

## 2019-11-11 DIAGNOSIS — I10 Essential (primary) hypertension: Secondary | ICD-10-CM | POA: Diagnosis not present

## 2019-11-11 DIAGNOSIS — I700 Atherosclerosis of aorta: Secondary | ICD-10-CM

## 2019-11-11 DIAGNOSIS — Z86010 Personal history of colonic polyps: Secondary | ICD-10-CM | POA: Diagnosis not present

## 2019-11-11 DIAGNOSIS — Z87442 Personal history of urinary calculi: Secondary | ICD-10-CM | POA: Diagnosis not present

## 2019-11-11 DIAGNOSIS — M25851 Other specified joint disorders, right hip: Secondary | ICD-10-CM

## 2019-11-11 NOTE — Progress Notes (Signed)
   Subjective:    Patient ID: [name] [name], male    DOB: 18-Apr-1955, 64 y.o.   MRN: [id number]  HPI Here to re-establish with [place] and to discuss several issues. He had been seeing Dr. Riki Sheer for primary care before transferring back here. He had been seeing Dr. Rosario Adie of [facility] for a left ureteral stone in January which was causing some mild hydronephrosis. He ended up having lithotripsy on 04-21-19 which was successful. On the CT scan prior to the procedure mention was made about extensive atherosclerosis in the aorta, the visceral arteries, and the iliacs. He was told this should be followed up. Also he saw Dr. Erlinda Hong for right hip pain and he had an US guided steroid injection on 10-07-19 for this. The US demonstrated a possible anterior labral tear. He continues to have severe hip pain when walking forward for short distances, even though he can run and play tennis with no pain at all. Third he had a colonoscopy with Dr. Hilarie Fredrickson on 03-28-16 when several adenomatous polyps were removed. A 2 year follow up scope was recommended but he never went back due to a lack of insurance. Now he wants to follow this up.   Review of Systems  Constitutional: Negative.   Respiratory: Negative.   Cardiovascular: Negative.   Musculoskeletal: Positive for arthralgias.       Objective:   Physical Exam Constitutional:      Appearance: Normal appearance.  Cardiovascular:     Rate and Rhythm: Normal rate and regular rhythm.     Pulses: Normal pulses.     Heart sounds: Normal heart sounds.  Pulmonary:     Effort: Pulmonary effort is normal.     Breath sounds: Normal breath sounds.  Neurological:     Mental Status: He is alert.           Assessment & Plan:  For the apparent atherosclerosis, we will refer him to Cardiology. He is already on a statin. For the labral tear, we will refer him back to Dr, Erlinda Hong. For the colon polyps, we will set up another colonoscopy with Dr.  Hilarie Fredrickson. Alysia Penna, MD

## 2019-11-14 ENCOUNTER — Encounter: Payer: Self-pay | Admitting: Orthopaedic Surgery

## 2019-11-14 ENCOUNTER — Other Ambulatory Visit: Payer: Self-pay

## 2019-11-14 ENCOUNTER — Ambulatory Visit (INDEPENDENT_AMBULATORY_CARE_PROVIDER_SITE_OTHER): Payer: Medicare Other | Admitting: Orthopaedic Surgery

## 2019-11-14 VITALS — Ht 71.0 in | Wt 235.0 lb

## 2019-11-14 DIAGNOSIS — M25551 Pain in right hip: Secondary | ICD-10-CM

## 2019-11-14 NOTE — Progress Notes (Signed)
Office Visit Note   Patient: [name] [name]           Date of Birth: 1956/01/06           MRN: [id number] Visit Date: 11/14/2019              Requested by: Laurey Morale, [place],  [place] [address] PCP: Shelda Pal, DO   Assessment & Plan: Visit Diagnoses:  1. Pain in right hip     Plan: Impression is chronic right hip pain with possible labral tear.  At this point, we will order an MR arthrogram to further assess this.  He will follow up with [place] once that has been completed.  Follow-Up Instructions: Return for after MR arthrogram.   Orders:  No orders of the defined types were placed in this encounter.  No orders of the defined types were placed in this encounter.     Procedures: No procedures performed   Clinical Data: No additional findings.   Subjective: Chief Complaint  Patient presents with   Right Hip - Pain    HPI patient is a pleasant 64 year old gentleman comes in today for follow-up of his right hip.  He has been dealing with a sharp pain to the right groin for the past 10 years.  The pain he has is intermittent and only occurs when walking straight.  The pain is relieved when he sits for a period of time.  He denies any numbness, tingling or burning.  Previous x-rays showed mild arthritis.  He did have an ultrasound-guided cortisone injection by Dr. Junius Roads which provided only 10% relief of symptoms.  Review of Systems as detailed in HPI.  All others reviewed and are negative.   Objective: Vital Signs: Ht 5\' 11"  (1.803 m)    Wt 235 lb (106.6 kg)    BMI 32.78 kg/m   Physical Exam well-developed well-nourished gentleman in no acute distress.  Alert and oriented x3.  Ortho Exam examination of his right hip reveals a negative logroll and negative FADIR.  Negative Stinchfield.  Negative straight leg raise.  He is neurovascular intact distally.  Specialty Comments:  No specialty comments available.  Imaging: No new  imaging   PMFS History: Patient Active Problem List   Diagnosis Date Noted   Right hip impingement syndrome 08/18/2019   Aortic atherosclerosis ([place]) 07/10/2019   Elevated hemoglobin ([place]) 09/13/2017   Rhegmatogenous retinal detachment of right eye 06/16/2014   Hyperlipidemia 10/01/2007   Essential hypertension 10/01/2007   NEOPLASM, MALIGNANT, TESTES, HX OF 10/01/2007   COLONIC POLYPS, HX OF 10/01/2007   NEPHROLITHIASIS, HX OF 10/01/2007   Past Medical History:  Diagnosis Date   Ankle fracture    Cancer ([place]) 1974   testicular cancer-at age 51   GERD (gastroesophageal reflux disease)    in the past   Hx of colonic polyps    pt unsure,thinks this was done in 1982 in [place]. no reports in EPIC   Hyperlipidemia    Hypertension    Kidney stones    Patella fracture    Retinal detachment    OD   Retinal tear of right eye    Syncopal episodes 2008    Family History  Problem Relation Age of Onset   Parkinson's disease Mother    Arrhythmia Father    Coronary artery disease Other        fhx   Hypertension Other        fhx  Sudden death Other        fhx   Colon cancer Neg Hx     Past Surgical History:  Procedure Laterality Date   AIR/FLUID EXCHANGE Right 06/16/2014   Procedure: AIR/FLUID EXCHANGE;  Surgeon: Hayden Pedro, MD;  Location: [place];  Service: Ophthalmology;  Laterality: Right;   CATARACT EXTRACTION     CATARACT EXTRACTION  09/2014   rt eye//left 01/2015   CATARACT EXTRACTION, BILATERAL  2016   per Dr. Herbert Deaner    COLONOSCOPY  10-21-12   per Dr. Sharlett Iles, adenomatous polyp, repeat in 3 yrs    EYE SURGERY     GAS INSERTION Right 06/16/2014   Procedure: INSERTION OF GAS;  Surgeon: Hayden Pedro, MD;  Location: [place];  Service: Ophthalmology;  Laterality: Right;  C3F8   HYDROCELE EXCISION     KNEE ARTHROSCOPY Right    x 2   LASER PHOTO ABLATION Right 06/16/2014   Procedure: LASER PHOTO ABLATION;  Surgeon: Hayden Pedro, MD;   Location: [place];  Service: Ophthalmology;  Laterality: Right;  Headscope laser and endolaser    ORCHIECTOMY     right testicle   REPAIR OF COMPLEX TRACTION RETINAL DETACHMENT Right 06/16/2014   Procedure: REPAIR OF COMPLEX TRACTION RETINAL DETACHMENT;  Surgeon: Hayden Pedro, MD;  Location: [place];  Service: Ophthalmology;  Laterality: Right;   RETINAL DETACHMENT SURGERY  06/2014   TONSILLECTOMY     VITRECTOMY Right 06/16/2014   Social History   Occupational History   Occupation: Solicitor: [employer]  Tobacco Use   Smoking status: Never Smoker   Smokeless tobacco: Never Used  Scientific laboratory technician Use: Never used  Substance and Sexual Activity   Alcohol use: Yes    Alcohol/week: 0.0 standard drinks    Comment: 2 beers yearly   Drug use: No   Sexual activity: Not on file

## 2019-11-27 ENCOUNTER — Telehealth: Payer: Self-pay | Admitting: Orthopaedic Surgery

## 2019-11-27 NOTE — Telephone Encounter (Signed)
Called patient left voicemail message to return call to schedule an appointment with Dr Erlinda Hong for MRI review    MRI done on 12/02/2019

## 2019-12-02 ENCOUNTER — Ambulatory Visit
Admission: RE | Admit: 2019-12-02 | Discharge: 2019-12-02 | Disposition: A | Discharge status: Home / Self Care | Payer: Medicare Other | Source: Ambulatory Visit | Attending: Orthopaedic Surgery | Admitting: Orthopaedic Surgery

## 2019-12-02 ENCOUNTER — Other Ambulatory Visit: Payer: Self-pay

## 2019-12-02 DIAGNOSIS — M25551 Pain in right hip: Secondary | ICD-10-CM

## 2019-12-02 MED ORDER — IOPAMIDOL (ISOVUE-M 200) INJECTION 41%
12.0000 mL | Freq: Once | INTRAMUSCULAR | Status: AC
  Administered 2019-12-02: 12 mL via INTRA_ARTICULAR

## 2019-12-03 NOTE — Progress Notes (Signed)
Spoke with patient.  We will need to order a lumbar spine MRI to rule out structural abnormalities.  Patient will follow-up after the lumbar spine MRI.  Thank you

## 2019-12-04 ENCOUNTER — Other Ambulatory Visit: Payer: Self-pay

## 2019-12-04 DIAGNOSIS — M545 Low back pain, unspecified: Secondary | ICD-10-CM

## 2019-12-09 ENCOUNTER — Ambulatory Visit: Payer: Medicare Other | Admitting: Orthopaedic Surgery

## 2019-12-12 ENCOUNTER — Encounter: Payer: Self-pay | Admitting: Interventional Cardiology

## 2019-12-12 ENCOUNTER — Ambulatory Visit
Admission: RE | Admit: 2019-12-12 | Discharge: 2019-12-12 | Disposition: A | Discharge status: Home / Self Care | Payer: Self-pay | Source: Ambulatory Visit | Attending: Interventional Cardiology | Admitting: Interventional Cardiology

## 2019-12-12 ENCOUNTER — Ambulatory Visit (INDEPENDENT_AMBULATORY_CARE_PROVIDER_SITE_OTHER): Payer: Medicare Other | Admitting: Interventional Cardiology

## 2019-12-12 ENCOUNTER — Other Ambulatory Visit: Payer: Self-pay

## 2019-12-12 VITALS — BP 136/80 | HR 61 | Ht 71.0 in | Wt 236.8 lb

## 2019-12-12 DIAGNOSIS — I700 Atherosclerosis of aorta: Secondary | ICD-10-CM | POA: Diagnosis not present

## 2019-12-12 DIAGNOSIS — E785 Hyperlipidemia, unspecified: Secondary | ICD-10-CM | POA: Diagnosis not present

## 2019-12-12 DIAGNOSIS — I10 Essential (primary) hypertension: Secondary | ICD-10-CM | POA: Diagnosis not present

## 2019-12-12 NOTE — Patient Instructions (Signed)
Medication Instructions:  Your physician recommends that you continue on your current medications as directed. Please refer to the Current Medication list given to you today.  *If you need a refill on your cardiac medications before your next appointment, please call your pharmacy*   Lab Work: None  If you have labs (blood work) drawn today and your tests are completely normal, you will receive your results only by: Marland Kitchen MyChart Message (if you have MyChart) OR . A paper copy in the mail If you have any lab test that is abnormal or we need to change your treatment, we will call you to review the results.   Testing/Procedures: Your physician recommends that you have a Calcium Score CT   Follow-Up: Based on test results   Other Instructions None

## 2019-12-12 NOTE — Progress Notes (Signed)
Cardiology Office Note   Date:  12/12/2019   ID:  [name], [name] July 31, 1955, MRN [id number]  PCP:  Laurey Morale, MD    No chief complaint on file.  Aortic atherosclerosis.   Wt Readings from Last 3 Encounters:  12/12/19 236 lb 12.8 oz (107.4 kg)  11/14/19 235 lb (106.6 kg)  11/11/19 233 lb 12.8 oz (106.1 kg)       History of Present Illness: [name] [name] is a 64 y.o. male who is being seen today for the evaluation of aortic atherosclerosis at the request of Laurey Morale, MD.  Abdominal pain from kidney stone led to CT scan showing: "Diffuse aortic and SMA atherosclerosis."  He has a h/o HTN nad hyperlipidemia:  BP meds needed since 2015.  Crestor started in 2021.   Denies : Chest pain. Dizziness. Leg edema. Nitroglycerin use. Orthopnea. Palpitations. Paroxysmal nocturnal dyspnea. Shortness of breath. Syncope.  Negative stress test many years ago.  No early CAD in the family.  No revascularization.  Brothers are healthy.  Sister died 41 years ago, noncardiac cause of death.  No tobacco use.    He plays tennis regularly without problems.  He was surprised when he found that he had plaque.    Past Medical History:  Diagnosis Date  . Ankle fracture   . Cancer [facility]) 1974   testicular cancer-at age 57  . GERD (gastroesophageal reflux disease)    in the past  . Hx of colonic polyps    pt unsure,thinks this was done in 1982 in [place]. no reports in EPIC  . Hyperlipidemia   . Hypertension   . Kidney stones   . Patella fracture   . Retinal detachment    OD  . Retinal tear of right eye   . Syncopal episodes 2008    Past Surgical History:  Procedure Laterality Date  . AIR/FLUID EXCHANGE Right 06/16/2014   Procedure: AIR/FLUID EXCHANGE;  Surgeon: Hayden Pedro, MD;  Location: [facility];  Service: Ophthalmology;  Laterality: Right;  . CATARACT EXTRACTION    . CATARACT EXTRACTION  09/2014   rt eye//left 01/2015  . CATARACT EXTRACTION, BILATERAL  2016   per Dr.  Herbert Deaner   . COLONOSCOPY  10-21-12   per Dr. Sharlett Iles, adenomatous polyp, repeat in 3 yrs   . EYE SURGERY    . GAS INSERTION Right 06/16/2014   Procedure: INSERTION OF GAS;  Surgeon: Hayden Pedro, MD;  Location: [facility];  Service: Ophthalmology;  Laterality: Right;  C3F8  . HYDROCELE EXCISION    . KNEE ARTHROSCOPY Right    x 2  . LASER PHOTO ABLATION Right 06/16/2014   Procedure: LASER PHOTO ABLATION;  Surgeon: Hayden Pedro, MD;  Location: [facility];  Service: Ophthalmology;  Laterality: Right;  Headscope laser and endolaser   . ORCHIECTOMY     right testicle  . REPAIR OF COMPLEX TRACTION RETINAL DETACHMENT Right 06/16/2014   Procedure: REPAIR OF COMPLEX TRACTION RETINAL DETACHMENT;  Surgeon: Hayden Pedro, MD;  Location: [facility];  Service: Ophthalmology;  Laterality: Right;  . RETINAL DETACHMENT SURGERY  06/2014  . TONSILLECTOMY    . VITRECTOMY Right 06/16/2014     Current Outpatient Medications  Medication Sig Dispense Refill  . amLODipine (NORVASC) 5 MG tablet TAKE 1 TABLET BY MOUTH EVERY DAY 90 tablet 1  . lisinopril-hydrochlorothiazide (ZESTORETIC) 20-12.5 MG tablet Take 1 tablet by mouth daily. 90 tablet 1  . rosuvastatin (CRESTOR) 20 MG tablet Take  1 tablet (20 mg total) by mouth daily. 90 tablet 3   No current facility-administered medications for this visit.    Allergies:   Patient has no known allergies.    Social History:  The patient  reports that he has never smoked. He has never used smokeless tobacco. He reports current alcohol use. He reports that he does not use drugs.   Family History:  The patient's family history includes Arrhythmia in his father; Coronary artery disease in an other family member; Hypertension in an other family member; Parkinson's disease in his mother; Sudden death in an other family member.    ROS:  Please see the history of present illness.   Otherwise, review of systems are positive for chronic hip pain .   All other systems are reviewed and  negative.    PHYSICAL EXAM: VS:  BP 136/80   Pulse 61   Ht 5\' 11"  (1.803 m)   Wt 236 lb 12.8 oz (107.4 kg)   SpO2 97%   BMI 33.03 kg/m  , BMI Body mass index is 33.03 kg/m. GEN: Well nourished, well developed, in no acute distress  HEENT: normal  Neck: no JVD, carotid bruits, or masses Cardiac: RRR; no murmurs, rubs, or gallops,no edema  Respiratory:  clear to auscultation bilaterally, normal work of breathing GI: soft, nontender, nondistended, + BS MS: no deformity or atrophy ; 3+ PT pulses bilaterally Skin: warm and dry, no rash Neuro:  Strength and sensation are intact Psych: euthymic mood, full affect   EKG:   The ekg ordered today demonstrates NSR, no ST changes   Recent Labs: 07/10/2019: ALT 14; BUN 20; Creatinine, Ser 1.02; Potassium 4.3; Sodium 138   Lipid Panel    Component Value Date/Time   CHOL 181 07/10/2019 0726   TRIG 165.0 (H) 07/10/2019 0726   HDL 30.50 (L) 07/10/2019 0726   CHOLHDL 6 07/10/2019 0726   VLDL 33.0 07/10/2019 0726   LDLCALC 117 (H) 07/10/2019 0726   LDLDIRECT 86.8 09/27/2012 0951     Other studies Reviewed: Additional studies/ records that were reviewed today with results demonstrating: abdominal CT reviewed.   ASSESSMENT AND PLAN:  1. Aortic atherosclerosis: Plan for Calcium scoring CT scan.  He is aware of out of pocket cost. 2. Hyperlipidemia: Continue Crestor. 3. HTN: The current medical regimen is effective;  continue present plan and medications.  Low salt diet.  Whole food plant based diet. 4. Obesity: Spoke about monitoring waist size.    Current medicines are reviewed at length with the patient today.  The patient concerns regarding his medicines were addressed.  The following changes have been made:  No change  Labs/ tests ordered today include: CT scan No orders of the defined types were placed in this encounter.   Recommend 150 minutes/week of aerobic exercise Low fat, low carb, high fiber diet  recommended  Disposition:   FU in based on CT results   Signed, Larae Grooms, MD  12/12/2019 1:34 PM    [facility] [address], [place], [place]  [address] Phone: [phone]; Fax: [phone]

## 2019-12-16 ENCOUNTER — Other Ambulatory Visit: Payer: Self-pay

## 2019-12-16 DIAGNOSIS — I700 Atherosclerosis of aorta: Secondary | ICD-10-CM

## 2019-12-16 MED ORDER — ROSUVASTATIN CALCIUM 40 MG PO TABS: 40.0000 mg | ORAL_TABLET | Freq: Every day | ORAL | 3 refills | Status: DC

## 2019-12-25 ENCOUNTER — Other Ambulatory Visit: Payer: Self-pay

## 2019-12-25 ENCOUNTER — Ambulatory Visit
Admission: RE | Admit: 2019-12-25 | Discharge: 2019-12-25 | Disposition: A | Discharge status: Home / Self Care | Payer: Medicare Other | Source: Ambulatory Visit | Attending: Orthopaedic Surgery | Admitting: Orthopaedic Surgery

## 2019-12-25 DIAGNOSIS — M545 Low back pain, unspecified: Secondary | ICD-10-CM

## 2019-12-26 ENCOUNTER — Encounter: Payer: Self-pay | Admitting: Internal Medicine

## 2019-12-30 ENCOUNTER — Ambulatory Visit (INDEPENDENT_AMBULATORY_CARE_PROVIDER_SITE_OTHER): Payer: Medicare Other | Admitting: Orthopaedic Surgery

## 2019-12-30 DIAGNOSIS — M25551 Pain in right hip: Secondary | ICD-10-CM

## 2019-12-30 NOTE — Progress Notes (Signed)
Office Visit Note   Patient: [name] [name]           Date of Birth: 01-05-1956           MRN: [id number] Visit Date: 12/30/2019              Requested by: Laurey Morale, MD [place],  [place] [id number] PCP: Laurey Morale, MD   Assessment & Plan: Visit Diagnoses:  1. Pain in right hip     Plan: MRI shows some mild degenerative changes at L4-5 L5-S1 but the pathology is more on the left side.  This is inconclusive for his symptoms that are on the right hip.  He continues to have sharp stabbing pain after ambulating in a straight line which improves with rest.  He has had a normal MRI scan.  At this point I recommend trying a course of outpatient physical therapy with modalities to see if this will improve things.  We will see him back as needed.  Follow-Up Instructions: Return if symptoms worsen or fail to improve.   Orders:  Orders Placed This Encounter  Procedures  . Ambulatory referral to Physical Therapy   No orders of the defined types were placed in this encounter.     Procedures: No procedures performed   Clinical Data: No additional findings.   Subjective: Chief Complaint  Patient presents with  . scan review    [name] returns today for MRI review of the lumbar spine.  Denies any changes.   Review of Systems   Objective: Vital Signs: There were no vitals taken for this visit.  Physical Exam  Ortho Exam Right hip exam is unchanged. Specialty Comments:  No specialty comments available.  Imaging: No results found.   PMFS History: Patient Active Problem List   Diagnosis Date Noted  . Right hip impingement syndrome 08/18/2019  . Aortic atherosclerosis ([place]) 07/10/2019  . Elevated hemoglobin ([place]) 09/13/2017  . 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 Hyperlipidemia 10/01/2007  . Essential hypertension 10/01/2007  . NEOPLASM, MALIGNANT, TESTES, HX OF 10/01/2007  . COLONIC POLYPS, HX OF 10/01/2007  .  NEPHROLITHIASIS, HX OF 10/01/2007   Past Medical History:  Diagnosis Date  . Ankle fracture   . Cancer [facility]) 1974   testicular cancer-at age 64  . GERD (gastroesophageal reflux disease)    in the past  . Hx of colonic polyps    pt unsure,thinks this was done in 65 in [place]. no reports in EPIC  . Hyperlipidemia   . Hypertension   . Kidney stones   . Patella fracture   . Retinal detachment    OD  . Retinal tear of right eye   . Syncopal episodes 2008    Family History  Problem Relation Age of Onset  . Parkinson's disease Mother   . Arrhythmia Father   . Coronary artery disease Other        fhx  . Hypertension Other        fhx  . Sudden death Other        fhx  . Colon cancer Neg Hx     Past Surgical History:  Procedure Laterality Date  . AIR/FLUID EXCHANGE Right 06/16/2014   Procedure: AIR/FLUID EXCHANGE;  Surgeon: Hayden Pedro, MD;  Location: [facility];  Service: Ophthalmology;  Laterality: Right;  . CATARACT EXTRACTION    . CATARACT EXTRACTION  09/2014   rt eye//left 01/2015  . CATARACT EXTRACTION, BILATERAL  2016   per Dr. Herbert Deaner   . COLONOSCOPY  10-21-12   per Dr. Sharlett Iles, adenomatous polyp, repeat in 3 yrs   . EYE SURGERY    . GAS INSERTION Right 06/16/2014   Procedure: INSERTION OF GAS;  Surgeon: Hayden Pedro, MD;  Location: [facility];  Service: Ophthalmology;  Laterality: Right;  C3F8  . HYDROCELE EXCISION    . KNEE ARTHROSCOPY Right    x 2  . LASER PHOTO ABLATION Right 06/16/2014   Procedure: LASER PHOTO ABLATION;  Surgeon: Hayden Pedro, MD;  Location: [facility];  Service: Ophthalmology;  Laterality: Right;  Headscope laser and endolaser   . ORCHIECTOMY     right testicle  . REPAIR OF COMPLEX TRACTION RETINAL DETACHMENT Right 06/16/2014   Procedure: REPAIR OF COMPLEX TRACTION RETINAL DETACHMENT;  Surgeon: Hayden Pedro, MD;  Location: [facility];  Service: Ophthalmology;  Laterality: Right;  . RETINAL DETACHMENT SURGERY  06/2014  . TONSILLECTOMY    . VITRECTOMY Right  06/16/2014   Social History   Occupational History  . Occupation: Solicitor: [employer]  Tobacco Use  . Smoking status: Never Smoker  . Smokeless tobacco: Never Used  Vaping Use  . Vaping Use: Never used  Substance and Sexual Activity  . Alcohol use: Yes    Alcohol/week: 0.0 standard drinks    Comment: 2 beers yearly  . Drug use: No  . Sexual activity: Not on file

## 2020-01-12 ENCOUNTER — Ambulatory Visit (INDEPENDENT_AMBULATORY_CARE_PROVIDER_SITE_OTHER): Payer: Medicare Other | Admitting: Rehabilitative and Restorative Service Providers"

## 2020-01-12 ENCOUNTER — Other Ambulatory Visit: Payer: Self-pay

## 2020-01-12 ENCOUNTER — Encounter: Payer: Self-pay | Admitting: Rehabilitative and Restorative Service Providers"

## 2020-01-12 DIAGNOSIS — R262 Difficulty in walking, not elsewhere classified: Secondary | ICD-10-CM | POA: Diagnosis not present

## 2020-01-12 DIAGNOSIS — M25551 Pain in right hip: Secondary | ICD-10-CM

## 2020-01-12 DIAGNOSIS — M25651 Stiffness of right hip, not elsewhere classified: Secondary | ICD-10-CM

## 2020-01-12 NOTE — Patient Instructions (Signed)
Access Code: [email or web address] URL: [email or web address] Date: 01/12/2020 Prepared by: Vista Mink  Exercises Standing Hip Hiking - 2 x daily - 7 x weekly - 3-5 sets - 5-10 reps - 5 seconds hold Hip Flexor Stretch with Chair - 2 x daily - 7 x weekly - 1 sets - 5 reps - 20 seconds hold Supine Piriformis Stretch - Involved Leg (BKA) - 2 x daily - 7 x weekly - 1 sets - 5 reps - 20 seconds hold

## 2020-01-12 NOTE — Therapy (Signed)
[facility] [place], [place], [address] Phone: [phone]   Fax:  [phone]  Physical Therapy Evaluation  Patient Details  Name: [name] [name] MRN: [id number] Date of Birth: Mar 10, 1956 Referring Provider (PT): Leandrew Koyanagi MD   Encounter Date: 01/12/2020   PT End of Session - 01/12/20 1126    Visit Number 1    Number of Visits 4    PT Start Time 0800    PT Stop Time 0845    PT Time Calculation (min) 45 min    Activity Tolerance Patient tolerated treatment well;No increased pain    Behavior During Therapy WFL for tasks assessed/performed           Past Medical History:  Diagnosis Date   Ankle fracture    Cancer ([place]) 1974   testicular cancer-at age 36   GERD (gastroesophageal reflux disease)    in the past   Hx of colonic polyps    pt unsure,thinks this was done in 1982 in [place]. no reports in EPIC   Hyperlipidemia    Hypertension    Kidney stones    Patella fracture    Retinal detachment    OD   Retinal tear of right eye    Syncopal episodes 2008    Past Surgical History:  Procedure Laterality Date   AIR/FLUID EXCHANGE Right 06/16/2014   Procedure: AIR/FLUID EXCHANGE;  Surgeon: Hayden Pedro, MD;  Location: [facility];  Service: Ophthalmology;  Laterality: Right;   CATARACT EXTRACTION     CATARACT EXTRACTION  09/2014   rt eye//left 01/2015   CATARACT EXTRACTION, BILATERAL  2016   per Dr. Herbert Deaner    COLONOSCOPY  10-21-12   per Dr. Sharlett Iles, adenomatous polyp, repeat in 3 yrs    EYE SURGERY     GAS INSERTION Right 06/16/2014   Procedure: INSERTION OF GAS;  Surgeon: Hayden Pedro, MD;  Location: [facility];  Service: Ophthalmology;  Laterality: Right;  C3F8   HYDROCELE EXCISION     KNEE ARTHROSCOPY Right    x 2   LASER PHOTO ABLATION Right 06/16/2014   Procedure: LASER PHOTO ABLATION;  Surgeon: Hayden Pedro, MD;  Location: [facility];  Service: Ophthalmology;  Laterality: Right;  Headscope laser and  endolaser    ORCHIECTOMY     right testicle   REPAIR OF COMPLEX TRACTION RETINAL DETACHMENT Right 06/16/2014   Procedure: REPAIR OF COMPLEX TRACTION RETINAL DETACHMENT;  Surgeon: Hayden Pedro, MD;  Location: [facility];  Service: Ophthalmology;  Laterality: Right;   RETINAL DETACHMENT SURGERY  06/2014   TONSILLECTOMY     VITRECTOMY Right 06/16/2014    There were no vitals filed for this visit.    Subjective Assessment - 01/12/20 0856    Subjective [name] [name] reports he is able to play tennis and golf with little to no hip pain.  "Always" after walking 1/2 mile he gets increasing R groin/hip pain.  If he rest for 30 seconds, he can do another 1/4 to 1/2 mile.    Pertinent History 3 previous knee arthroscopy surgeries.    Limitations Walking    How long can you walk comfortably? 1/2 mile    Diagnostic tests MRI and injection.  MRI negative.  Injection shows possible labrum tear.    Patient Stated Goals Be able to walk more than 1/2 mile without having to stop and rest because of hip pain.    Currently in Pain? No/denies    Multiple Pain Sites No  [facility] PT Assessment - 01/12/20 0001      Assessment   Medical Diagnosis Pain in R hip    Referring Provider (PT) Georga Kaufmann Ephriam Jenkins MD    Onset Date/Surgical Date --   10 years     Balance Screen   Has the patient fallen in the past 6 months No    Has the patient had a decrease in activity level because of a fear of falling?  No    Is the patient reluctant to leave their home because of a fear of falling?  No      Prior Function   Level of Independence Independent      Cognition   Overall Cognitive Status Within Functional Limits for tasks assessed      Observation/Other Assessments   Focus on Therapeutic Outcomes (FOTO)  85 (Goal 90)      ROM / Strength   AROM / PROM / Strength AROM;Strength      AROM   Overall AROM  Deficits    AROM Assessment Site Hip    Right/Left Hip Left;Right    Right Hip Flexion 105    Right  Hip External Rotation  27    Right Hip Internal Rotation  4    Left Hip Flexion 105    Left Hip External Rotation  30    Left Hip Internal Rotation  5      Strength   Overall Strength Within functional limits for tasks performed    Strength Assessment Site Hip    Right/Left Hip Left;Right    Right Hip Flexion 5/5    Right Hip ABduction 5/5    Left Hip Flexion 5/5    Left Hip ABduction 5/5      Flexibility   Soft Tissue Assessment /Muscle Length yes    Hamstrings 50 degrees/45 degrees                      Objective measurements completed on examination: See above findings.       [facility] Adult PT Treatment/Exercise - 01/12/20 0001      Exercises   Exercises Knee/Hip      Knee/Hip Exercises: Stretches   Hip Flexor Stretch 5 reps;20 seconds   Standing with 1 foot in chair   Piriformis Stretch 5 reps;20 seconds   1 ankle over other (just above) knee, bent knee to chest     Knee/Hip Exercises: Standing   Hip Abduction Stengthening;Both;3 sets;5 reps   5 seconds Hip abduction with pelvic stabilization                 PT Education - 01/12/20 1125    Education Details Discussed exam findings.  Prescribed and reviewed an appropriate HEP.    Person(s) Educated Patient    Methods Explanation;Demonstration;Tactile cues;Verbal cues;Handout    Comprehension Verbal cues required;Need further instruction;Returned demonstration;Verbalized understanding;Tactile cues required               PT Long Term Goals - 01/12/20 1633      PT LONG TERM GOAL #1   Title [name] [name] will be able to walk a mile or more without having to stop due to hip pain.    Baseline Forced to stop after a 1/2 mile due to hip pain.    Time 4    Period Weeks    Status New    Target Date 02/13/20      PT LONG TERM GOAL #2   Title [name] [name] will  score 90 or better on the FOTO.    Baseline 85    Time 4    Period Weeks    Status New    Target Date 02/13/20      PT LONG TERM GOAL #3   Title [name] [name]  will be independent with his home exercise program at discharge.    Time 4    Period Weeks    Status New    Target Date 02/13/20                  Plan - 01/12/20 1126    Clinical Impression Statement [name] [name] is physically active.  He plays tennis and golf with little to no hip pain.  Walking is stopped at "about a 1/2 mile every time" by R hip pain.  After "30 seconds" of sitting, [name] [name] can walk another 1/4 to 1/2 mile.  PT will focus on hip strengthening to see if pain-free walking endurance can improve or if [name] [name] will need another option.  4 weeks of PT are recommended.  His prognosis to improve is good unless a labrum or joint defect is present.  MRI appeared normal, injection may have shown an issue.    Personal Factors and Comorbidities Comorbidity 1    Comorbidities 3 previous knee arthroscopy surgeries.    Examination-Activity Limitations Other   Walking > 1/2 mile   Examination-Participation Restrictions Community Activity;Other   Walking > 1/2 mile   Stability/Clinical Decision Making Stable/Uncomplicated    Clinical Decision Making Low    Rehab Potential Good    PT Frequency 1x / week    PT Duration 4 weeks    PT Treatment/Interventions ADLs/Self Care Home Management;Therapeutic activities;Gait training;Therapeutic exercise;Neuromuscular re-education;Patient/family education    PT Next Visit Plan Correct HEP technique, consider other hip abductors and quadriceps strength activities.    PT Home Exercise Plan Access Code: [email or web address]    [email or web address] and Agree with Plan of Care Patient           Patient will benefit from skilled therapeutic intervention in order to improve the following deficits and impairments:  Abnormal gait, Decreased activity tolerance, Difficulty walking, Pain  Visit Diagnosis: Difficulty walking  Stiffness of right hip, not elsewhere classified  Pain in right hip     Problem List Patient Active Problem List   Diagnosis Date Noted   Right hip  impingement syndrome 08/18/2019   Aortic atherosclerosis ([place]) 07/10/2019   Elevated hemoglobin ([place]) 09/13/2017   Rhegmatogenous retinal detachment of right eye 06/16/2014   Hyperlipidemia 10/01/2007   Essential hypertension 10/01/2007   NEOPLASM, MALIGNANT, TESTES, HX OF 10/01/2007   COLONIC POLYPS, HX OF 10/01/2007   NEPHROLITHIASIS, HX OF 10/01/2007    Farley Ly PT, MPT 01/12/2020, 4:36 PM  [facility] [address] [place], [place], [address] Phone: [phone]   Fax:  [phone]  Name: [name] [name] MRN: [id number] Date of Birth: 02/02/1956

## 2020-01-20 ENCOUNTER — Other Ambulatory Visit: Payer: Self-pay

## 2020-01-20 ENCOUNTER — Ambulatory Visit (INDEPENDENT_AMBULATORY_CARE_PROVIDER_SITE_OTHER): Payer: Medicare Other | Admitting: Physical Therapy

## 2020-01-20 DIAGNOSIS — M25551 Pain in right hip: Secondary | ICD-10-CM | POA: Diagnosis not present

## 2020-01-20 DIAGNOSIS — M25651 Stiffness of right hip, not elsewhere classified: Secondary | ICD-10-CM

## 2020-01-20 DIAGNOSIS — R262 Difficulty in walking, not elsewhere classified: Secondary | ICD-10-CM

## 2020-01-20 NOTE — Therapy (Signed)
[facility] [place], [place], [address] Phone: [phone]   Fax:  [phone]  Physical Therapy Treatment  Patient Details  Name: [name] [name] MRN: [id number] Date of Birth: [birth year] Referring Provider (PT): Leandrew Koyanagi MD   Encounter Date: 01/20/2020   PT End of Session - 01/20/20 0927    Visit Number 2    Number of Visits 4    PT Start Time 0845    PT Stop Time 0930    PT Time Calculation (min) 45 min    Activity Tolerance Patient tolerated treatment well;No increased pain    Behavior During Therapy WFL for tasks assessed/performed           Past Medical History:  Diagnosis Date   Ankle fracture    Cancer ([place]) 1974   testicular cancer-at age 54   GERD (gastroesophageal reflux disease)    in the past   Hx of colonic polyps    pt unsure,thinks this was done in 1982 in [place]. no reports in EPIC   Hyperlipidemia    Hypertension    Kidney stones    Patella fracture    Retinal detachment    OD   Retinal tear of right eye    Syncopal episodes 2008    Past Surgical History:  Procedure Laterality Date   AIR/FLUID EXCHANGE Right 06/16/2014   Procedure: AIR/FLUID EXCHANGE;  Surgeon: Hayden Pedro, MD;  Location: [facility];  Service: Ophthalmology;  Laterality: Right;   CATARACT EXTRACTION     CATARACT EXTRACTION  09/2014   rt eye//left 01/2015   CATARACT EXTRACTION, BILATERAL  2016   per Dr. Herbert Deaner    COLONOSCOPY  10-21-12   per Dr. Sharlett Iles, adenomatous polyp, repeat in 3 yrs    EYE SURGERY     GAS INSERTION Right 06/16/2014   Procedure: INSERTION OF GAS;  Surgeon: Hayden Pedro, MD;  Location: [facility];  Service: Ophthalmology;  Laterality: Right;  C3F8   HYDROCELE EXCISION     KNEE ARTHROSCOPY Right    x 2   LASER PHOTO ABLATION Right 06/16/2014   Procedure: LASER PHOTO ABLATION;  Surgeon: Hayden Pedro, MD;  Location: [facility];  Service: Ophthalmology;  Laterality: Right;  Headscope laser and  endolaser    ORCHIECTOMY     right testicle   REPAIR OF COMPLEX TRACTION RETINAL DETACHMENT Right 06/16/2014   Procedure: REPAIR OF COMPLEX TRACTION RETINAL DETACHMENT;  Surgeon: Hayden Pedro, MD;  Location: [facility];  Service: Ophthalmology;  Laterality: Right;   RETINAL DETACHMENT SURGERY  06/2014   TONSILLECTOMY     VITRECTOMY Right 06/16/2014    There were no vitals filed for this visit.   Subjective Assessment - 01/20/20 0856    Subjective relays only pain is when he walks 1/4 to 1/2 mile. Pain is "on the bone" in his Rt hip.    Pertinent History 3 previous knee arthroscopy surgeries.    Limitations Walking    How long can you walk comfortably? 1/2 mile    Diagnostic tests MRI and injection.  MRI negative.  Injection shows possible labrum tear.    Patient Stated Goals Be able to walk more than 1/2 mile without having to stop and rest because of hip pain.                             [facility] PT Treatment/Exercise - 01/20/20 0001  Knee/Hip Exercises: Stretches   Active Hamstring Stretch Right;3 reps;30 seconds    Hip Flexor Stretch Right;4 reps;30 seconds    Hip Flexor Stretch Limitations supine with strap    Piriformis Stretch 3 reps;30 seconds      Knee/Hip Exercises: Aerobic   Recumbent Bike 5 min L4      Knee/Hip Exercises: Machines for Strengthening   Total Gym Leg Press 100 lbs SL 2X10 ea side      Knee/Hip Exercises: Supine   Single Leg Bridge Both;15 reps   holding 5 sec   Straight Leg Raises Right;2 sets;10 reps    Straight Leg Raises Limitations 3 lbs      Knee/Hip Exercises: Sidelying   Hip ABduction Right;2 sets;10 reps    Hip ABduction Limitations 3 lbs    Hip ADduction Right;2 sets;10 reps    Hip ADduction Limitations 3 lbs      Knee/Hip Exercises: Prone   Hip Extension Right;2 sets;10 reps    Hip Extension Limitations 3 lbs      Manual Therapy   Manual therapy comments Rt hip PROM flexion, IR/ER, inferior/distraction  glides                       PT Long Term Goals - 01/12/20 1633      PT LONG TERM GOAL #1   Title [name] will be able to walk a mile or more without having to stop due to hip pain.    Baseline Forced to stop after a 1/2 mile due to hip pain.    Time 4    Period Weeks    Status New    Target Date 02/13/20      PT LONG TERM GOAL #2   Title [name] will score 90 or better on the FOTO.    Baseline 85    Time 4    Period Weeks    Status New    Target Date 02/13/20      PT LONG TERM GOAL #3   Title [name] will be independent with his home exercise program at discharge.    Time 4    Period Weeks    Status New    Target Date 02/13/20                 Plan - 01/20/20 6578    Clinical Impression Statement Session focused on overall hip ROM and strenght/stabiliity with good tolerance. Added seated pirioformis stretch and SL bridge into HEP along with trial of manual therapy for hip mobs today to see if this overall helps reduce pain with walking.    Personal Factors and Comorbidities Comorbidity 1    Comorbidities 3 previous knee arthroscopy surgeries.    Examination-Activity Limitations Other   Walking > 1/2 mile   Examination-Participation Restrictions Community Activity;Other   Walking > 1/2 mile   Stability/Clinical Decision Making Stable/Uncomplicated    Rehab Potential Good    PT Frequency 1x / week    PT Duration 4 weeks    PT Treatment/Interventions ADLs/Self Care Home Management;Therapeutic activities;Gait training;Therapeutic exercise;Neuromuscular re-education;Patient/family education    PT Next Visit Plan how is pain with walking? Correct HEP technique, consider other hip abductors and quadriceps strength activities.    PT Home Exercise Plan Access Code: [email or web address]    Consulted and Agree with Plan of Care Patient           Patient will benefit from skilled therapeutic intervention in order to improve the following deficits  and impairments:  Abnormal gait,  Decreased activity tolerance, Difficulty walking, Pain  Visit Diagnosis: Difficulty walking  Stiffness of right hip, not elsewhere classified  Pain in right hip     Problem List Patient Active Problem List   Diagnosis Date Noted   Right hip impingement syndrome 08/18/2019   Aortic atherosclerosis ([place]) 07/10/2019   Elevated hemoglobin ([place]) 09/13/2017   Rhegmatogenous retinal detachment of right eye 06/16/2014   Hyperlipidemia 10/01/2007   Essential hypertension 10/01/2007   NEOPLASM, MALIGNANT, TESTES, HX OF 10/01/2007   COLONIC POLYPS, HX OF 10/01/2007   NEPHROLITHIASIS, HX OF 10/01/2007    Silvestre Mesi 01/20/2020, 9:30 AM  [facility] [address] [place], [place], [address] Phone: [phone]   Fax:  [phone]  Name: ABHIMANYU CRUCES MRN: [id number] Date of Birth: 1955-05-03

## 2020-01-23 ENCOUNTER — Ambulatory Visit (AMBULATORY_SURGERY_CENTER): Payer: Self-pay

## 2020-01-23 ENCOUNTER — Other Ambulatory Visit: Payer: Self-pay

## 2020-01-23 VITALS — Ht 71.0 in | Wt 226.0 lb

## 2020-01-23 DIAGNOSIS — Z86010 Personal history of colonic polyps: Secondary | ICD-10-CM

## 2020-01-23 MED ORDER — SUTAB 1479-225-188 MG PO TABS: 1.0000 | ORAL_TABLET | ORAL | 0 refills | Status: DC

## 2020-01-23 NOTE — Progress Notes (Signed)
No egg or soy allergy known to patient  No issues with past sedation with any surgeries or procedures No intubation problems in the past  No FH of Malignant Hyperthermia No diet pills per patient No home 02 use per patient  No blood thinners per patient  Pt denies issues with constipation  No A fib or A flutter  EMMI video via MyChart  COVID 19 guidelines implemented in PV today with Pt and RN  Coupon given to pt in PV today , Code to Pharmacy  COVID vaccines completed on 07/2019 per pt;  Due to the COVID-19 pandemic we are asking patients to follow these guidelines. Please only bring one care partner. Please be aware that your care partner may wait in the car in the parking lot or if they feel like they will be too hot to wait in the car, they may wait in the lobby on the 4th floor. All care partners are required to wear a mask the entire time (we do not have any that we can provide them), they need to practice social distancing, and we will do a Covid check for all patient's and care partners when you arrive. Also we will check their temperature and your temperature. If the care partner waits in their car they need to stay in the parking lot the entire time and we will call them on their cell phone when the patient is ready for discharge so they can bring the car to the front of the building. Also all patient's will need to wear a mask into building.  

## 2020-02-02 ENCOUNTER — Ambulatory Visit (INDEPENDENT_AMBULATORY_CARE_PROVIDER_SITE_OTHER): Payer: Medicare Other | Admitting: Physical Therapy

## 2020-02-02 ENCOUNTER — Encounter: Payer: Self-pay | Admitting: Physical Therapy

## 2020-02-02 ENCOUNTER — Other Ambulatory Visit: Payer: Self-pay

## 2020-02-02 DIAGNOSIS — M25551 Pain in right hip: Secondary | ICD-10-CM

## 2020-02-02 DIAGNOSIS — R262 Difficulty in walking, not elsewhere classified: Secondary | ICD-10-CM

## 2020-02-02 DIAGNOSIS — M25651 Stiffness of right hip, not elsewhere classified: Secondary | ICD-10-CM

## 2020-02-02 NOTE — Therapy (Addendum)
[facility] [address] [place], [place], [address] Phone: [phone]   Fax:  [phone]    PHYSICAL THERAPY DISCHARGE SUMMARY  Visits from Start of Care: 3  Current functional level related to goals / functional outcomes: See below   Remaining deficits: See below   Education / Equipment: HEP  Plan: Patient agrees to discharge.  Patient goals were not met. Patient is being discharged due to not returning since the last visit.  ?????   Jamey Reas, PT, DPT Physical Therapist Specializing in Prosthetic Rehab [facility] [address] [place], [place] [address] Phone [phone] FAX [phone]             Physical Therapy Treatment  Patient Details  Name: [name] [name] MRN: [id number] Date of Birth: 1955-07-14 Referring Provider (PT): Leandrew Koyanagi MD   Encounter Date: 02/02/2020   PT End of Session - 02/02/20 0800    Visit Number 3    Number of Visits 4    PT Start Time 0800    PT Stop Time 0840    PT Time Calculation (min) 40 min    Activity Tolerance Patient tolerated treatment well;No increased pain    Behavior During Therapy WFL for tasks assessed/performed           Past Medical History:  Diagnosis Date  . Ankle fracture   . Cancer [facility]) 1974   testicular cancer-at age 57  . Cataract    bilateral sx  . GERD (gastroesophageal reflux disease)    hx of  . Hx of colonic polyps    pt unsure,thinks this was done in 1982 in [place]. no reports in EPIC  . Hyperlipidemia    on meds  . Hypertension    on meds  . Kidney stones   . Patella fracture   . Retinal detachment    OD  . Retinal tear of right eye   . Syncopal episodes 2008    Past Surgical History:  Procedure Laterality Date  . AIR/FLUID EXCHANGE Right 06/16/2014   Procedure: AIR/FLUID EXCHANGE;  Surgeon: Hayden Pedro, MD;  Location: [facility];  Service: Ophthalmology;  Laterality: Right;  . CATARACT EXTRACTION    .  CATARACT EXTRACTION  09/2014   rt eye//left 01/2015  . CATARACT EXTRACTION, BILATERAL  2016   per Dr. Herbert Deaner   . COLONOSCOPY  10-21-12   per Dr. Sharlett Iles, adenomatous polyp, repeat in 3 yrs   . EYE SURGERY    . GAS INSERTION Right 06/16/2014   Procedure: INSERTION OF GAS;  Surgeon: Hayden Pedro, MD;  Location: [facility];  Service: Ophthalmology;  Laterality: Right;  C3F8  . HYDROCELE EXCISION    . KNEE ARTHROSCOPY Right    x 2  . LASER PHOTO ABLATION Right 06/16/2014   Procedure: LASER PHOTO ABLATION;  Surgeon: Hayden Pedro, MD;  Location: [facility];  Service: Ophthalmology;  Laterality: Right;  Headscope laser and endolaser   . ORCHIECTOMY     right testicle  . REPAIR OF COMPLEX TRACTION RETINAL DETACHMENT Right 06/16/2014   Procedure: REPAIR OF COMPLEX TRACTION RETINAL DETACHMENT;  Surgeon: Hayden Pedro, MD;  Location: [facility];  Service: Ophthalmology;  Laterality: Right;  . RETINAL DETACHMENT SURGERY  06/2014  . TONSILLECTOMY    . VITRECTOMY Right 06/16/2014  . WISDOM TOOTH EXTRACTION      There were no vitals filed for this visit.   Subjective Assessment - 02/02/20 0800  Subjective The piriformis stretch is only exercise that seems to help.  He does the others intermiitently.    Pertinent History 3 previous knee arthroscopy surgeries.    Limitations Walking    How long can you walk comfortably? 1/4 to 1/2 mile then sharp right hip pain 9-10/10 which subsides within 1 minute.    Diagnostic tests MRI and injection.  MRI negative.  Injection shows possible labrum tear.    Patient Stated Goals Be able to walk more than 1/2 mile without having to stop and rest because of hip pain.    Currently in Pain? No/denies              [facility] PT Assessment - 02/02/20 0800      Assessment   Medical Diagnosis Pain in R hip    Referring Provider (PT) Leandrew Koyanagi MD      AROM   Right Ankle Dorsiflexion 0    Left Ankle Dorsiflexion 14                         [facility] Adult  PT Treatment/Exercise - 02/02/20 0800      Ambulation/Gait   Ambulation/Gait Yes    Ambulation/Gait Assistance 5: Supervision   verbal cues only no balance issues   Ambulation/Gait Assistance Details Pt ambulates with limited RLE dorsiflexion & delayed right heel rise / knee flexion in terminal stance and weight shift medial to foot causing torsion motion of hip.  PT demo & verbal cues on proper from / technique & he return demo understanding.     Ambulation Distance (Feet) 100 Feet   in clinic    Assistive device None    Gait Comments discussed athletic shoes & impact on gait. Recommendation for consult at [place] for shoe for his foot & gait type.  PT issued temporary heel lifts to compensate for gastroc tightness      Knee/Hip Exercises: Stretches   Active Hamstring Stretch Right;30 seconds;2 reps    Active Hamstring Stretch Limitations seated RLE extended forward lean, added gastroc with strap    Hip Flexor Stretch --    Hip Flexor Stretch Limitations --    Piriformis Stretch 3 reps;30 seconds    Piriformis Stretch Limitations seated with rotation & trunk flexion,  supine with RLE crossed    Press photographer Right;3 reps;30 seconds    Gastroc Stretch Limitations seated with RLE extended & standing with forefoot on 4" step    Soleus Stretch Right;1 rep;30 seconds      Knee/Hip Exercises: Aerobic   Recumbent Bike 5 min L4      Knee/Hip Exercises: Machines for Strengthening   Total Gym Leg Press --      Knee/Hip Exercises: Supine   Single Leg Bridge --    Straight Leg Raises --    Straight Leg Raises Limitations --      Knee/Hip Exercises: Sidelying   Hip ABduction --    Hip ABduction Limitations --    Hip ADduction --    Hip ADduction Limitations --      Knee/Hip Exercises: Prone   Hip Extension Right;2 sets;10 reps    Hip Extension Limitations 3 lbs      Manual Therapy   Manual therapy comments --              Access Code: [email or web address] URL:  [email or web address] Date: 02/02/2020 Prepared by: Jamey Reas  Exercises Standing Hip Hiking - 2 x daily - 7 x  weekly - 3-5 sets - 5-10 reps - 5 seconds hold Hip Flexor Stretch with Chair - 2 x daily - 7 x weekly - 1 sets - 5 reps - 20 seconds hold Supine Piriformis Stretch - Involved Leg (BKA) - 2 x daily - 7 x weekly - 1 sets - 5 reps - 20 seconds hold Doorway Piriformis Stretch - 1-3 x daily - 7 x weekly - 1 sets - 1-3 reps - 30 seconds hold Supine Piriformis Stretch with Leg Straight - 1 x daily - 5 x weekly - 1 sets - 10 reps - 5 seconds hold Seated Hamstring Stretch - 2 x daily - 7 x weekly - 3 reps - 1 sets - 30 seconds hold Seated Gastroc Stretch with Strap - 2 x daily - 7 x weekly - 3 reps - 1 sets - 30 seconds hold Seated Hamstring Stretch with Strap - 2 x daily - 7 x weekly - 3 reps - 1 sets - 30 seconds hold Standing Gastroc Stretch at Counter - 2 x daily - 7 x weekly - 1 sets - 3 reps - 30 seconds hold Standing Gastroc Stretch on Step with Counter Support - 2 x daily - 7 x weekly - 1 sets - 3 reps - 30 seconds hold Standing Soleus Stretch on Foam 1/2 Roll - 1 x daily - 5 x weekly - 1 sets - 10 reps - 5 seconds hold Seated Soleus Stretch - 1 x daily - 5 x weekly - 1 sets - 10 reps - 5 seconds hold      PT Education - 02/02/20 0840    Education Details updated HEP Access Code: [email or web address]   recommendation for shoe assessment    Person(s) Educated Patient    Methods Explanation;Demonstration;Verbal cues;Handout    Comprehension Verbalized understanding;Returned demonstration               PT Long Term Goals - 01/12/20 1633      PT LONG TERM GOAL #1   Title [name] [name] will be able to walk a mile or more without having to stop due to hip pain.    Baseline Forced to stop after a 1/2 mile due to hip pain.    Time 4    Period Weeks    Status New    Target Date 02/13/20      PT LONG TERM GOAL #2   Title [name] [name] will score 90 or better on the FOTO.    Baseline 85     Time 4    Period Weeks    Status New    Target Date 02/13/20      PT LONG TERM GOAL #3   Title [name] [name] will be independent with his home exercise program at discharge.    Time 4    Period Weeks    Status New    Target Date 02/13/20                 Plan - 02/02/20 0805    Clinical Impression Statement Patient has limited right ankle dorsiflexion which is effecting gait and may be factor in his hip pain.  PT instructed in stretches & gait to address and he appears to understand.    Personal Factors and Comorbidities Comorbidity 1    Comorbidities 3 previous knee arthroscopy surgeries.    Examination-Activity Limitations Other   Walking > 1/2 mile   Examination-Participation Restrictions Community Activity;Other   Walking > 1/2 mile   Stability/Clinical Decision Making Stable/Uncomplicated  Rehab Potential Good    PT Frequency 1x / week    PT Duration 4 weeks    PT Treatment/Interventions ADLs/Self Care Home Management;Therapeutic activities;Gait training;Therapeutic exercise;Neuromuscular re-education;Patient/family education    PT Next Visit Plan how is pain with walking? Correct HEP technique, consider other hip abductors and quadriceps strength activities.    PT Home Exercise Plan Access Code: [email or web address]    Consulted and Agree with Plan of Care Patient           Patient will benefit from skilled therapeutic intervention in order to improve the following deficits and impairments:  Abnormal gait, Decreased activity tolerance, Difficulty walking, Pain  Visit Diagnosis: Difficulty walking  Stiffness of right hip, not elsewhere classified  Pain in right hip     Problem List Patient Active Problem List   Diagnosis Date Noted  . Right hip impingement syndrome 08/18/2019  . Aortic atherosclerosis ([facility]) 07/10/2019  . Elevated hemoglobin ([facility]) 09/13/2017  . 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 Hyperlipidemia 10/01/2007  . Essential  hypertension 10/01/2007  . NEOPLASM, MALIGNANT, TESTES, HX OF 10/01/2007  . COLONIC POLYPS, HX OF 10/01/2007  . NEPHROLITHIASIS, HX OF 10/01/2007     , PT, DPT 02/02/2020, 10:39 AM   [facility] [address] [place], [place], [address] Phone: [phone]   Fax:  [phone]  Name: Kaiel G Vanwinkle MRN: [id number] Date of Birth: 11/20/1955   

## 2020-02-02 NOTE — Patient Instructions (Signed)
Access Code: [email or web address] URL: [email or web address] Date: 02/02/2020 Prepared by: Jamey Reas  Exercises Standing Hip Hiking - 2 x daily - 7 x weekly - 3-5 sets - 5-10 reps - 5 seconds hold Hip Flexor Stretch with Chair - 2 x daily - 7 x weekly - 1 sets - 5 reps - 20 seconds hold Supine Piriformis Stretch - Involved Leg (BKA) - 2 x daily - 7 x weekly - 1 sets - 5 reps - 20 seconds hold Doorway Piriformis Stretch - 1-3 x daily - 7 x weekly - 1 sets - 1-3 reps - 30 seconds hold Supine Piriformis Stretch with Leg Straight - 1 x daily - 5 x weekly - 1 sets - 10 reps - 5 seconds hold Seated Hamstring Stretch - 2 x daily - 7 x weekly - 3 reps - 1 sets - 30 seconds hold Seated Gastroc Stretch with Strap - 2 x daily - 7 x weekly - 3 reps - 1 sets - 30 seconds hold Seated Hamstring Stretch with Strap - 2 x daily - 7 x weekly - 3 reps - 1 sets - 30 seconds hold Standing Gastroc Stretch at Counter - 2 x daily - 7 x weekly - 1 sets - 3 reps - 30 seconds hold Standing Gastroc Stretch on Step with Counter Support - 2 x daily - 7 x weekly - 1 sets - 3 reps - 30 seconds hold Standing Soleus Stretch on Foam 1/2 Roll - 1 x daily - 5 x weekly - 1 sets - 10 reps - 5 seconds hold Seated Soleus Stretch - 1 x daily - 5 x weekly - 1 sets - 10 reps - 5 seconds hold

## 2020-02-03 ENCOUNTER — Encounter: Payer: Medicare Other | Admitting: Physical Therapy

## 2020-02-03 ENCOUNTER — Telehealth: Payer: Self-pay | Admitting: Internal Medicine

## 2020-02-03 DIAGNOSIS — Z86010 Personal history of colonic polyps: Secondary | ICD-10-CM

## 2020-02-03 NOTE — Telephone Encounter (Signed)
Pt states his pharmacy hasn't received the SUTAB medication

## 2020-02-04 MED ORDER — SUTAB 1479-225-188 MG PO TABS: 1.0000 | ORAL_TABLET | ORAL | 0 refills | Status: DC

## 2020-02-04 NOTE — Telephone Encounter (Signed)
Sutab rx resent to pt's pharmacy at this time-per pt's request.

## 2020-02-16 ENCOUNTER — Ambulatory Visit: Payer: Medicare Other | Admitting: Family Medicine

## 2020-02-16 ENCOUNTER — Ambulatory Visit (AMBULATORY_SURGERY_CENTER): Payer: Medicare Other | Admitting: Internal Medicine

## 2020-02-16 ENCOUNTER — Other Ambulatory Visit: Payer: Self-pay

## 2020-02-16 ENCOUNTER — Encounter: Payer: Self-pay | Admitting: Internal Medicine

## 2020-02-16 VITALS — BP 141/59 | HR 57 | Temp 97.6°F | Resp 16 | Ht 71.0 in | Wt 226.0 lb

## 2020-02-16 DIAGNOSIS — D120 Benign neoplasm of cecum: Secondary | ICD-10-CM

## 2020-02-16 DIAGNOSIS — D122 Benign neoplasm of ascending colon: Secondary | ICD-10-CM

## 2020-02-16 DIAGNOSIS — D128 Benign neoplasm of rectum: Secondary | ICD-10-CM

## 2020-02-16 DIAGNOSIS — Z86010 Personal history of colonic polyps: Secondary | ICD-10-CM | POA: Diagnosis not present

## 2020-02-16 DIAGNOSIS — K5730 Diverticulosis of large intestine without perforation or abscess without bleeding: Secondary | ICD-10-CM

## 2020-02-16 DIAGNOSIS — D123 Benign neoplasm of transverse colon: Secondary | ICD-10-CM | POA: Diagnosis not present

## 2020-02-16 DIAGNOSIS — K621 Rectal polyp: Secondary | ICD-10-CM

## 2020-02-16 HISTORY — PX: COLONOSCOPY: SHX174

## 2020-02-16 MED ORDER — SODIUM CHLORIDE 0.9 % IV SOLN: 500.0000 mL | Freq: Once | INTRAVENOUS | Status: DC

## 2020-02-16 NOTE — Op Note (Signed)
[name] [name]: [name] [name] Procedure Date: 02/16/2020 12:45 PM MRN: [id number] Endoscopist: Jerene Bears , MD Age: 64 Referring MD:  Date of Birth: February 21, 1956 Gender: Male Account #: [id number] Procedure:                Colonoscopy Indications:              High risk colon cancer surveillance: Personal                            history of colonic polyps, Last colonoscopy:                            December 2017 Medicines:                Monitored Anesthesia Care Procedure:                Pre-Anesthesia Assessment:                           - Prior to the procedure, a History and Physical                            was performed, and patient medications and                            allergies were reviewed. The patient's tolerance of                            previous anesthesia was also reviewed. The risks                            and benefits of the procedure and the sedation                            options and risks were discussed with the patient.                            All questions were answered, and informed consent                            was obtained. Prior Anticoagulants: The patient has                            taken no previous anticoagulant or antiplatelet                            agents. ASA Grade Assessment: II - A patient with                            mild systemic disease. After reviewing the risks                            and benefits, the patient was deemed in  satisfactory condition to undergo the procedure.                           After obtaining informed consent, the colonoscope                            was passed under direct vision. Throughout the                            procedure, the patient's blood pressure, pulse, and                            oxygen saturations were monitored continuously. The                            Colonoscope was introduced through the anus and                             advanced to the cecum, identified by appendiceal                            orifice and ileocecal valve. The colonoscopy was                            performed without difficulty. The patient tolerated                            the procedure well. The quality of the bowel                            preparation was good. The ileocecal valve,                            appendiceal orifice, and rectum were photographed. Scope In: 1:37:50 PM Scope Out: 2:02:11 PM Scope Withdrawal Time: 0 hours 19 minutes 6 seconds  Total Procedure Duration: 0 hours 24 minutes 21 seconds  Findings:                 Hemorrhoids were found on perianal exam.                           Two sessile polyps were found in the cecum. The                            polyps were 4 to 5 mm in size. These polyps were                            removed with a cold snare. Resection and retrieval                            were complete.                           A 3 mm polyp was found in the ascending colon.  The                            polyp was sessile. The polyp was removed with a                            cold snare. Resection and retrieval were complete.                           Two sessile polyps were found in the transverse                            colon. The polyps were 2 to 4 mm in size. These                            polyps were removed with a cold snare. Resection                            and retrieval were complete.                           A 3 mm polyp was found in the rectum. The polyp was                            sessile. The polyp was removed with a cold biopsy                            forceps. Resection and retrieval were complete.                           There were 2 small lipomas in the descending colon                            and in the ascending colon.                           Many small and large-mouthed diverticula were found                            in the sigmoid  colon and descending colon. There                            was evidence of diverticular spasm. Erythema was                            seen in association with the diverticular opening                            in the sigmoid. Biopsies were taken with a cold                            forceps for histology to exclude SCAD.  Internal hemorrhoids were found during retroflexion                            and during digital exam. The hemorrhoids were                            medium-sized. Complications:            No immediate complications. Estimated Blood Loss:     Estimated blood loss was minimal. Impression:               - Two 4 to 5 mm polyps in the cecum, removed with a                            cold snare. Resected and retrieved.                           - One 3 mm polyp in the ascending colon, removed                            with a cold snare. Resected and retrieved.                           - Two 2 to 4 mm polyps in the transverse colon,                            removed with a cold snare. Resected and retrieved.                           - One 3 mm polyp in the rectum, removed with a cold                            biopsy forceps. Resected and retrieved.                           - Small lipomas in the descending colon and in the                            ascending colon.                           - Severe diverticulosis in the sigmoid colon and in                            the descending colon. There was evidence of                            diverticular spasm. Erythema was seen in                            association with the diverticular opening. Biopsied.                           - Internal hemorrhoids. Recommendation:           -  Patient has a contact number available for                            emergencies. The signs and symptoms of potential                            delayed complications were discussed with the                             patient. Return to normal activities tomorrow.                            Written discharge instructions were provided to the                            patient.                           - Resume previous diet.                           - Continue present medications.                           - Await pathology results.                           - Repeat colonoscopy is recommended for                            surveillance. The colonoscopy date will be                            determined after pathology results from today's                            exam become available for review. Jerene Bears, MD 02/16/2020 2:08:48 PM This report has been signed electronically.

## 2020-02-16 NOTE — Progress Notes (Signed)
VS by CW  Pt's states no medical or surgical changes since previsit or office visit.  

## 2020-02-16 NOTE — Progress Notes (Signed)
PT taken to PACU. Monitors in place. VSS. Report given to RN. 

## 2020-02-16 NOTE — Progress Notes (Signed)
Called to room to assist during endoscopic procedure.  Patient ID and intended procedure confirmed with present staff. Received instructions for my participation in the procedure from the performing physician.  

## 2020-02-16 NOTE — Patient Instructions (Signed)
Handouts given:  Polyps, Diverticulosis, hemorrhoids Resume previous diet  continue current medications Await pathology resuls  YOU HAD AN ENDOSCOPIC PROCEDURE TODAY AT [facility]:   Refer to the procedure report that was given to you for any specific questions about what was found during the examination.  If the procedure report does not answer your questions, please call your gastroenterologist to clarify.  If you requested that your care partner not be given the details of your procedure findings, then the procedure report has been included in a sealed envelope for you to review at your convenience later.  YOU SHOULD EXPECT: Some feelings of bloating in the abdomen. Passage of more gas than usual.  Walking can help get rid of the air that was put into your GI tract during the procedure and reduce the bloating. If you had a lower endoscopy (such as a colonoscopy or flexible sigmoidoscopy) you may notice spotting of blood in your stool or on the toilet paper. If you underwent a bowel prep for your procedure, you may not have a normal bowel movement for a few days.  Please Note:  You might notice some irritation and congestion in your nose or some drainage.  This is from the oxygen used during your procedure.  There is no need for concern and it should clear up in a day or so.  SYMPTOMS TO REPORT IMMEDIATELY:   Following lower endoscopy (colonoscopy or flexible sigmoidoscopy):  Excessive amounts of blood in the stool  Significant tenderness or worsening of abdominal pains  Swelling of the abdomen that is new, acute  Fever of 100F or higher  For urgent or emergent issues, a gastroenterologist can be reached at any hour by calling [phone]. Do not use MyChart messaging for urgent concerns.   DIET:  We do recommend a small meal at first, but then you may proceed to your regular diet.  Drink plenty of fluids but you should avoid alcoholic beverages for 24  hours.  ACTIVITY:  You should plan to take it easy for the rest of today and you should NOT DRIVE or use heavy machinery until tomorrow (because of the sedation medicines used during the test).    FOLLOW UP: Our staff will call the number listed on your records 48-72 hours following your procedure to check on you and address any questions or concerns that you may have regarding the information given to you following your procedure. If we do not reach you, we will leave a message.  We will attempt to reach you two times.  During this call, we will ask if you have developed any symptoms of COVID 19. If you develop any symptoms (ie: fever, flu-like symptoms, shortness of breath, cough etc.) before then, please call [phone].  If you test positive for Covid 19 in the 2 weeks post procedure, please call and report this information to [place].    If any biopsies were taken you will be contacted by phone or by letter within the next 1-3 weeks.  Please call us at [phone] if you have not heard about the biopsies in 3 weeks.    SIGNATURES/CONFIDENTIALITY: You and/or your care partner have signed paperwork which will be entered into your electronic medical record.  These signatures attest to the fact that that the information above on your After Visit Summary has been reviewed and is understood.  Full responsibility of the confidentiality of this discharge information lies with you and/or your care-partner.

## 2020-02-18 ENCOUNTER — Telehealth: Payer: Self-pay | Admitting: *Deleted

## 2020-02-18 NOTE — Telephone Encounter (Signed)
  Follow up Call-  Call back number 02/16/2020  Post procedure Call Back phone  # [phone]  Permission to leave phone message Yes  Some recent data might be hidden     Patient questions:  Do you have a fever, pain , or abdominal swelling? No. Pain Score  0 *  Have you tolerated food without any problems? Yes.    Have you been able to return to your normal activities? Yes.    Do you have any questions about your discharge instructions: Diet   No. Medications  No. Follow up visit  No.  Do you have questions or concerns about your Care? No.  Actions: * If pain score is 4 or above: 1. No action needed, pain <4.Have you developed a fever since your procedure?no  2.   Have you had an respiratory symptoms (SOB or cough) since your procedure?no  3.   Have you tested positive for COVID 19 since your procedure no  4.   Have you had any family members/close contacts diagnosed with the COVID 19 since your procedure?  no   If yes to any of these questions please route to Joylene John, RN and Joella Prince, RN

## 2020-02-23 ENCOUNTER — Encounter: Payer: Self-pay | Admitting: Internal Medicine

## 2020-03-16 ENCOUNTER — Other Ambulatory Visit: Payer: Medicare Other | Admitting: *Deleted

## 2020-03-16 ENCOUNTER — Other Ambulatory Visit: Payer: Self-pay

## 2020-03-16 DIAGNOSIS — I700 Atherosclerosis of aorta: Secondary | ICD-10-CM

## 2020-03-16 LAB — LIPID PANEL
Chol/HDL Ratio: 2.5 ratio (ref 0.0–5.0)
Cholesterol, Total: 88 mg/dL — ABNORMAL LOW (ref 100–199)
HDL: 35 mg/dL — ABNORMAL LOW (ref >39)
LDL Chol Calc (NIH): 35 mg/dL (ref 0–99)
Triglycerides: 88 mg/dL (ref 0–149)
VLDL Cholesterol Cal: 18 mg/dL (ref 5–40)

## 2020-03-16 LAB — HEPATIC FUNCTION PANEL
ALT: 22 IU/L (ref 0–44)
AST: 22 IU/L (ref 0–40)
Albumin: 4.3 g/dL (ref 3.8–4.8)
Alkaline Phosphatase: 67 IU/L (ref 44–121)
Bilirubin Total: 0.8 mg/dL (ref 0.0–1.2)
Bilirubin, Direct: 0.25 mg/dL (ref 0.00–0.40)
Total Protein: 6.3 g/dL (ref 6.0–8.5)

## 2020-04-10 ENCOUNTER — Other Ambulatory Visit: Payer: Self-pay | Admitting: Family Medicine

## 2020-04-10 DIAGNOSIS — I10 Essential (primary) hypertension: Secondary | ICD-10-CM

## 2020-04-16 ENCOUNTER — Other Ambulatory Visit: Payer: Self-pay

## 2020-04-16 ENCOUNTER — Telehealth: Payer: Self-pay | Admitting: Family Medicine

## 2020-04-16 MED ORDER — METHYLPREDNISOLONE 4 MG PO TABS: 4.0000 mg | ORAL_TABLET | Freq: Every day | ORAL | 0 refills | Status: DC

## 2020-04-16 NOTE — Telephone Encounter (Signed)
Spoke with patient, sent in Medrol 4mg  dose pack to [facility] on [address] in [place].

## 2020-04-16 NOTE — Telephone Encounter (Signed)
Call in a 4 mg Medrol dose pack  

## 2020-04-16 NOTE — Telephone Encounter (Signed)
Patient is calling and stated that he thinks he is having a Gout flare up and wanted to see if there was anything that could be use otc or called in, please advise. CB is [phone]

## 2020-06-11 NOTE — Progress Notes (Signed)
Cardiology Office Note   Date:  06/14/2020   ID:  [name], [name] December 30, 1955, MRN [id number]  PCP:  Laurey Morale, MD    No chief complaint on file.  Aortic atherosclerosis  Wt Readings from Last 3 Encounters:  06/14/20 228 lb 9.6 oz (103.7 kg)  02/16/20 226 lb (102.5 kg)  01/23/20 226 lb (102.5 kg)       History of Present Illness: [name] [name] is a 65 y.o. male  who is being seen today for the evaluation of aortic atherosclerosis at the request of Laurey Morale, MD.  Abdominal pain from kidney stone led to CT scan showing: "Diffuse aortic and SMA atherosclerosis."  He has a h/o HTN nad hyperlipidemia:  BP meds needed since 2015.  Crestor started in 2021.   No early CAD in the family.  No revascularization.  Brothers are healthy.  Sister died 77 years ago, noncardiac cause of death.  No tobacco use.    He plays tennis regularly without problems.  He was surprised when he found that he had plaque.  Denies : Chest pain. Dizziness. Leg edema. Nitroglycerin use. Orthopnea. Palpitations. Paroxysmal nocturnal dyspnea. Shortness of breath. Syncope.   Had a gout flare.    Past Medical History:  Diagnosis Date  . Ankle fracture   . Cancer [facility] 1974   testicular cancer-at age 22  . Cataract    bilateral sx  . GERD (gastroesophageal reflux disease)    hx of  . Hx of colonic polyps    pt unsure,thinks this was done in 1982 in [place]. no reports in EPIC  . Hyperlipidemia    on meds  . Hypertension    on meds  . Kidney stones   . Patella fracture   . Retinal detachment    OD  . Retinal tear of right eye   . Syncopal episodes 2008    Past Surgical History:  Procedure Laterality Date  . AIR/FLUID EXCHANGE Right 06/16/2014   Procedure: AIR/FLUID EXCHANGE;  Surgeon: Hayden Pedro, MD;  Location: [place];  Service: Ophthalmology;  Laterality: Right;  . CATARACT EXTRACTION    . CATARACT EXTRACTION  09/2014   rt eye//left 01/2015  . CATARACT EXTRACTION,  BILATERAL  2016   per Dr. Herbert Deaner   . COLONOSCOPY  10-21-12   per Dr. Sharlett Iles, adenomatous polyp, repeat in 3 yrs   . EYE SURGERY    . GAS INSERTION Right 06/16/2014   Procedure: INSERTION OF GAS;  Surgeon: Hayden Pedro, MD;  Location: [place];  Service: Ophthalmology;  Laterality: Right;  C3F8  . HYDROCELE EXCISION    . KNEE ARTHROSCOPY Right    x 2  . LASER PHOTO ABLATION Right 06/16/2014   Procedure: LASER PHOTO ABLATION;  Surgeon: Hayden Pedro, MD;  Location: [place];  Service: Ophthalmology;  Laterality: Right;  Headscope laser and endolaser   . ORCHIECTOMY     right testicle  . REPAIR OF COMPLEX TRACTION RETINAL DETACHMENT Right 06/16/2014   Procedure: REPAIR OF COMPLEX TRACTION RETINAL DETACHMENT;  Surgeon: Hayden Pedro, MD;  Location: [place];  Service: Ophthalmology;  Laterality: Right;  . RETINAL DETACHMENT SURGERY  06/2014  . TONSILLECTOMY    . VITRECTOMY Right 06/16/2014  . WISDOM TOOTH EXTRACTION       Current Outpatient Medications  Medication Sig Dispense Refill  . amLODipine (NORVASC) 5 MG tablet TAKE 1 TABLET BY MOUTH EVERY DAY 90 tablet 3  . lisinopril-hydrochlorothiazide (ZESTORETIC) 20-12.5  MG tablet TAKE 1 TABLET BY MOUTH EVERY DAY 90 tablet 3  . rosuvastatin (CRESTOR) 40 MG tablet Take 1 tablet (40 mg total) by mouth daily. 90 tablet 3   No current facility-administered medications for this visit.    Allergies:   Patient has no known allergies.    Social History:  The patient  reports that he has never smoked. He has never used smokeless tobacco. He reports current alcohol use. He reports that he does not use drugs.   Family History:  The patient's family history includes Arrhythmia in his father; Coronary artery disease in an other family member; Hypertension in an other family member; Parkinson's disease in his mother; Sudden death in an other family member.    ROS:  Please see the history of present illness.   Otherwise, review of systems are positive  for hip pain.   All other systems are reviewed and negative.    PHYSICAL EXAM: VS:  BP 134/90   Pulse 65   Ht 5\' 11"  (1.803 m)   Wt 228 lb 9.6 oz (103.7 kg)   SpO2 98%   BMI 31.88 kg/m  , BMI Body mass index is 31.88 kg/m. GEN: Well nourished, well developed, in no acute distress  HEENT: normal  Neck: no JVD, carotid bruits, or masses Cardiac: RRR; no murmurs, rubs, or gallops,no edema  Respiratory:  [name] to auscultation bilaterally, normal work of breathing GI: soft, nontender, nondistended, + BS MS: no deformity or atrophy  Skin: warm and dry, no rash Neuro:  Strength and sensation are intact Psych: euthymic mood, full affect    Recent Labs: 07/10/2019: BUN 20; Creatinine, Ser 1.02; Potassium 4.3; Sodium 138 03/16/2020: ALT 22   Lipid Panel    Component Value Date/Time   CHOL 88 (L) 03/16/2020 0800   TRIG 88 03/16/2020 0800   HDL 35 (L) 03/16/2020 0800   CHOLHDL 2.5 03/16/2020 0800   CHOLHDL 6 07/10/2019 0726   VLDL 33.0 07/10/2019 0726   LDLCALC 35 03/16/2020 0800   LDLDIRECT 86.8 09/27/2012 0951     Other studies Reviewed: Additional studies/ records that were reviewed today with results demonstrating: labs reviewed .   ASSESSMENT AND PLAN:  1. Aortic atherosclerosis: RF modification.  Noted by prior CT scan. 2. Coronary calcification: We spoke at length about the CT. watch for any change in symptoms when he exercises.  Given his high-level exercise, will not pursue any stress testing at this time.  If he develops any chest discomfort or decrease in exercise tolerance, he will let us know. 3. Hyperlipidemia: Whole food, plantbased diet.  Increase fiber intake.  LDL 35. TG 88, HDL 35 in 12/21. LFTs normal.  4. HTN: The current medical regimen is effective;  continue present plan and medications.  Recheck in office by me, 134/72. 5. Obesity: Avoid processed foods.  Continue regular exercise.  He plays high-level, competitive tennis.  He can play for several hours  without any symptoms.   Current medicines are reviewed at length with the patient today.  The patient concerns regarding his medicines were addressed.  The following changes have been made:  No change  Labs/ tests ordered today include:  No orders of the defined types were placed in this encounter.   Recommend 150 minutes/week of aerobic exercise Low fat, low carb, high fiber diet recommended  Disposition:   FU in 1 year   Signed, Larae Grooms, MD  06/14/2020 8:26 AM    [facility]  [address], [place], [place]  [address] Phone: [phone]; Fax: [phone]

## 2020-06-14 ENCOUNTER — Encounter: Payer: Self-pay | Admitting: Interventional Cardiology

## 2020-06-14 ENCOUNTER — Other Ambulatory Visit: Payer: Self-pay

## 2020-06-14 ENCOUNTER — Ambulatory Visit (INDEPENDENT_AMBULATORY_CARE_PROVIDER_SITE_OTHER): Payer: Medicare Other | Admitting: Interventional Cardiology

## 2020-06-14 VITALS — BP 134/72 | HR 65 | Ht 71.0 in | Wt 228.6 lb

## 2020-06-14 DIAGNOSIS — E785 Hyperlipidemia, unspecified: Secondary | ICD-10-CM

## 2020-06-14 DIAGNOSIS — I700 Atherosclerosis of aorta: Secondary | ICD-10-CM | POA: Diagnosis not present

## 2020-06-14 DIAGNOSIS — E669 Obesity, unspecified: Secondary | ICD-10-CM | POA: Diagnosis not present

## 2020-06-14 DIAGNOSIS — I10 Essential (primary) hypertension: Secondary | ICD-10-CM

## 2020-06-14 DIAGNOSIS — E66811 Obesity, class 1: Secondary | ICD-10-CM

## 2020-06-14 MED ORDER — ASPIRIN EC 81 MG PO TBEC: 81.0000 mg | DELAYED_RELEASE_TABLET | Freq: Every day | ORAL | 3 refills | Status: DC

## 2020-06-14 NOTE — Patient Instructions (Signed)
Medication Instructions:  Your physician has recommended you make the following change in your medication: Start enteric coated aspirin 81 mg by mouth daily  *If you need a refill on your cardiac medications before your next appointment, please call your pharmacy*   Lab Work: none If you have labs (blood work) drawn today and your tests are completely normal, you will receive your results only by: Marland Kitchen MyChart Message (if you have MyChart) OR . A paper copy in the mail If you have any lab test that is abnormal or we need to change your treatment, we will call you to review the results.   Testing/Procedures: none   Follow-Up: At [facility], you and your health needs are our priority.  As part of our continuing mission to provide you with exceptional heart care, we have created designated Provider Care Teams.  These Care Teams include your primary Cardiologist (physician) and Advanced Practice Providers (APPs -  Physician Assistants and Nurse Practitioners) who all work together to provide you with the care you need, when you need it.  We recommend signing up for the patient portal called "MyChart".  Sign up information is provided on this After Visit Summary.  MyChart is used to connect with patients for Virtual Visits (Telemedicine).  Patients are able to view lab/test results, encounter notes, upcoming appointments, etc.  Non-urgent messages can be sent to your provider as well.   To learn more about what you can do with MyChart, go to [email or web address].    Your next appointment:   12 month(s)  The format for your next appointment:   In Person  Provider:   You may see Larae Grooms, MD or one of the following Advanced Practice Providers on your designated Care Team:    Melina Copa, PA-C  Ermalinda Barrios, PA-C    Other Instructions

## 2020-10-06 ENCOUNTER — Other Ambulatory Visit: Payer: Self-pay

## 2020-10-07 ENCOUNTER — Ambulatory Visit (INDEPENDENT_AMBULATORY_CARE_PROVIDER_SITE_OTHER): Payer: Medicare Other | Admitting: Family Medicine

## 2020-10-07 ENCOUNTER — Encounter: Payer: Self-pay | Admitting: Family Medicine

## 2020-10-07 VITALS — BP 124/82 | HR 62 | Temp 98.1°F | Ht 71.0 in | Wt 229.0 lb

## 2020-10-07 DIAGNOSIS — R739 Hyperglycemia, unspecified: Secondary | ICD-10-CM | POA: Diagnosis not present

## 2020-10-07 DIAGNOSIS — M25851 Other specified joint disorders, right hip: Secondary | ICD-10-CM | POA: Diagnosis not present

## 2020-10-07 DIAGNOSIS — N138 Other obstructive and reflux uropathy: Secondary | ICD-10-CM | POA: Diagnosis not present

## 2020-10-07 DIAGNOSIS — N401 Enlarged prostate with lower urinary tract symptoms: Secondary | ICD-10-CM | POA: Diagnosis not present

## 2020-10-07 DIAGNOSIS — Z0000 Encounter for general adult medical examination without abnormal findings: Secondary | ICD-10-CM

## 2020-10-07 DIAGNOSIS — I10 Essential (primary) hypertension: Secondary | ICD-10-CM | POA: Diagnosis not present

## 2020-10-07 DIAGNOSIS — E785 Hyperlipidemia, unspecified: Secondary | ICD-10-CM | POA: Diagnosis not present

## 2020-10-07 LAB — BASIC METABOLIC PANEL
BUN: 26 mg/dL — ABNORMAL HIGH (ref 6–23)
CO2: 27 mEq/L (ref 19–32)
Calcium: 9.6 mg/dL (ref 8.4–10.5)
Chloride: 104 mEq/L (ref 96–112)
Creatinine, Ser: 1.1 mg/dL (ref 0.40–1.50)
GFR: 70.1 mL/min (ref >60.00)
Glucose, Bld: 91 mg/dL (ref 70–99)
Potassium: 4.1 mEq/L (ref 3.5–5.1)
Sodium: 140 mEq/L (ref 135–145)

## 2020-10-07 LAB — PSA: PSA: 1.06 ng/mL (ref 0.10–4.00)

## 2020-10-07 LAB — HEPATIC FUNCTION PANEL
ALT: 20 U/L (ref 0–53)
AST: 20 U/L (ref 0–37)
Albumin: 4.6 g/dL (ref 3.5–5.2)
Alkaline Phosphatase: 56 U/L (ref 39–117)
Bilirubin, Direct: 0.3 mg/dL (ref 0.0–0.3)
Total Bilirubin: 1.3 mg/dL — ABNORMAL HIGH (ref 0.2–1.2)
Total Protein: 6.7 g/dL (ref 6.0–8.3)

## 2020-10-07 LAB — LIPID PANEL
Cholesterol: 91 mg/dL (ref 0–200)
HDL: 33 mg/dL — ABNORMAL LOW (ref >39.00)
LDL Cholesterol: 38 mg/dL (ref 0–99)
NonHDL: 58.44
Total CHOL/HDL Ratio: 3
Triglycerides: 104 mg/dL (ref 0.0–149.0)
VLDL: 20.8 mg/dL (ref 0.0–40.0)

## 2020-10-07 LAB — HEMOGLOBIN A1C: Hgb A1c MFr Bld: 5.3 % (ref 4.6–6.5)

## 2020-10-07 LAB — CBC WITH DIFFERENTIAL/PLATELET
Basophils Absolute: 0.1 10*3/uL (ref 0.0–0.1)
Basophils Relative: 1.1 % (ref 0.0–3.0)
Eosinophils Absolute: 0.2 10*3/uL (ref 0.0–0.7)
Eosinophils Relative: 3.2 % (ref 0.0–5.0)
HCT: 47.2 % (ref 39.0–52.0)
Hemoglobin: 16.5 g/dL (ref 13.0–17.0)
Lymphocytes Relative: 18.9 % (ref 12.0–46.0)
Lymphs Abs: 1.2 10*3/uL (ref 0.7–4.0)
MCHC: 34.9 g/dL (ref 30.0–36.0)
MCV: 83.9 fl (ref 78.0–100.0)
Monocytes Absolute: 0.5 10*3/uL (ref 0.1–1.0)
Monocytes Relative: 8.1 % (ref 3.0–12.0)
Neutro Abs: 4.2 10*3/uL (ref 1.4–7.7)
Neutrophils Relative %: 68.7 % (ref 43.0–77.0)
Platelets: 142 10*3/uL — ABNORMAL LOW (ref 150.0–400.0)
RBC: 5.62 Mil/uL (ref 4.22–5.81)
RDW: 13.2 % (ref 11.5–15.5)
WBC: 6.1 10*3/uL (ref 4.0–10.5)

## 2020-10-07 LAB — TSH: TSH: 1.01 u[IU]/mL (ref 0.35–5.50)

## 2020-10-07 LAB — T4, FREE: Free T4: 1.12 ng/dL (ref 0.60–1.60)

## 2020-10-07 LAB — T3, FREE: T3, Free: 3.6 pg/mL (ref 2.3–4.2)

## 2020-10-07 NOTE — Progress Notes (Signed)
Subjective:    Patient ID: [name] [name], male    DOB: [birth year], 65 y.o.   MRN: [id number]  HPI Here to follow up on issues. He feels great except for right hip pain. This started several months ago. He describes a sharp pain in the hip which comes on after he walks very short distances. After he stop or sits down, it resolves almost immediately. He saw Dr. Erlinda Hong, who obtained an MRI of the hip. Apparently this was normal, so he does not want to do any type of surgery. He tried a cortisone shot, which did not help. He then went to PT, and PT has not helped either. It sounds like the injection was given under [place] guidance, and the person doing the [place] said they could see a tear in the joint. [name] [name] is frustrated that he is not getting better and he asks about getting a second opinion. His BP is stable.   Review of Systems  Constitutional: Negative.   HENT: Negative.    Eyes: Negative.   Respiratory: Negative.    Cardiovascular: Negative.   Gastrointestinal: Negative.   Genitourinary: Negative.   Musculoskeletal:  Positive for arthralgias.  Skin: Negative.   Neurological: Negative.   Psychiatric/Behavioral: Negative.        Objective:   Physical Exam Constitutional:      General: He is not in acute distress.    Appearance: Normal appearance. He is well-developed. He is not diaphoretic.  HENT:     Head: Normocephalic and atraumatic.     Right Ear: External ear normal.     Left Ear: External ear normal.     Nose: Nose normal.     Mouth/Throat:     Pharynx: No oropharyngeal exudate.  Eyes:     General: No scleral icterus.       Right eye: No discharge.        Left eye: No discharge.     Conjunctiva/sclera: Conjunctivae normal.     Pupils: Pupils are equal, round, and reactive to light.  Neck:     Thyroid: No thyromegaly.     Vascular: No JVD.     Trachea: No tracheal deviation.  Cardiovascular:     Rate and Rhythm: Normal rate and regular rhythm.     Heart sounds: Normal heart  sounds. No murmur heard.   No friction rub. No gallop.  Pulmonary:     Effort: Pulmonary effort is normal. No respiratory distress.     Breath sounds: Normal breath sounds. No wheezing or rales.  Chest:     Chest wall: No tenderness.  Abdominal:     General: Bowel sounds are normal. There is no distension.     Palpations: Abdomen is soft. There is no mass.     Tenderness: There is no abdominal tenderness. There is no guarding or rebound.  Genitourinary:    Penis: Normal. No tenderness.      Testes: Normal.     Prostate: Normal.     Rectum: Normal. Guaiac result negative.  Musculoskeletal:        General: No tenderness. Normal range of motion.     Cervical back: Neck supple.  Lymphadenopathy:     Cervical: No cervical adenopathy.  Skin:    General: Skin is warm and dry.     Coloration: Skin is not pale.     Findings: No erythema or rash.  Neurological:     Mental Status: He is alert and oriented to person, place, and  time.     Cranial Nerves: No cranial nerve deficit.     Motor: No abnormal muscle tone.     Coordination: Coordination normal.     Deep Tendon Reflexes: Reflexes are normal and symmetric. Reflexes normal.  Psychiatric:        Behavior: Behavior normal.        Thought Content: Thought content normal.        Judgment: Judgment normal.          Assessment & Plan:  He seems to be doing well in general. His HTN is well controlled. We will get fasting labs to check lipids, etc. For the hip pain, we will refer him to see Dr. Paralee Cancel for a second opinion. We spent 35 minutes reviewing his records and discussing these issues.  Alysia Penna, MD

## 2020-11-03 ENCOUNTER — Ambulatory Visit (INDEPENDENT_AMBULATORY_CARE_PROVIDER_SITE_OTHER): Payer: Medicare Other

## 2020-11-03 ENCOUNTER — Other Ambulatory Visit: Payer: Self-pay

## 2020-11-03 DIAGNOSIS — Z0000 Encounter for general adult medical examination without abnormal findings: Secondary | ICD-10-CM | POA: Diagnosis not present

## 2020-11-03 NOTE — Progress Notes (Addendum)
Virtual Visit via Telephone Note  I connected with  [name] [name] on 11/03/20 at  8:00 AM EDT by telephone and verified that I am speaking with the correct person using two identifiers.  Location: Patient: Home Provider: Office Persons participating in the virtual visit: patient/Nurse Health Advisor   I discussed the limitations, risks, security and privacy concerns of performing an evaluation and management service by telephone and the availability of in person appointments. The patient expressed understanding and agreed to proceed.  Interactive audio and video telecommunications were attempted between this nurse and patient, however failed, due to patient having technical difficulties OR patient did not have access to video capability.  We continued and completed visit with audio only.  Some vital signs may be absent or patient reported.   Willette Brace, LPN   Subjective:   [name] [name] is a 65 y.o. male who presents for an Initial Medicare Annual Wellness Visit.  Review of Systems     Cardiac Risk Factors include: advanced age (>46mn, >>75women);hypertension;dyslipidemia;male gender;obesity (BMI >30kg/m2)     Objective:    There were no vitals filed for this visit. There is no height or weight on file to calculate BMI.  Advanced Directives 11/03/2020 04/02/2016 03/20/2016 06/16/2014  Does Patient Have a Medical Advance Directive? Yes No Yes Yes  Type of Advance Directive Living will;Healthcare Power of AParadiseLiving will -  Does patient want to make changes to medical advance directive? - - - No - Patient declined  Copy of [place] Chart? No - copy requested - - No - copy requested    Current Medications (verified) Outpatient Encounter Medications as of 11/03/2020  Medication Sig   amLODipine (NORVASC) 5 MG tablet TAKE 1 TABLET BY MOUTH EVERY DAY   aspirin EC 81 MG tablet Take 1 tablet (81 mg total) by mouth daily.  Swallow whole.   lisinopril-hydrochlorothiazide (ZESTORETIC) 20-12.5 MG tablet TAKE 1 TABLET BY MOUTH EVERY DAY   rosuvastatin (CRESTOR) 40 MG tablet Take 1 tablet (40 mg total) by mouth daily.   No facility-administered encounter medications on file as of 11/03/2020.    Allergies (verified) Patient has no known allergies.   History: Past Medical History:  Diagnosis Date   Ankle fracture    [place]   testicular cancer-at age 65  Cataract    bilateral sx   GERD (gastroesophageal reflux disease)    hx of   Hx of colonic polyps    [place] unsure,[place]   Hyperlipidemia    on meds   Hypertension    on meds   Kidney stones    Patella fracture    Retinal detachment    OD   Retinal tear of right eye    Syncopal episodes 2008   Past Surgical History:  Procedure Laterality Date   AIR/FLUID EXCHANGE Right 06/16/2014   Procedure: AIR/FLUID EXCHANGE;  Surgeon: JHayden Pedro MD;  [place]  Service: Ophthalmology;  Laterality: Right;   CATARACT EXTRACTION     CATARACT EXTRACTION  09/2014   rt eye//left 01/2015   CATARACT EXTRACTION, BILATERAL  2016   per Dr. HHerbert Deaner   COLONOSCOPY  02/16/2020   per Dr. PHilarie Fredrickson adenomatous polyps, repeat in 3 yrs   EYE SURGERY     GAS INSERTION Right 06/16/2014   Procedure: INSERTION OF GAS;  Surgeon: JHayden Pedro MD;  Location: [facility];  Service: Ophthalmology;  Laterality: Right;  [id number]   HYDROCELE EXCISION     KNEE ARTHROSCOPY Right    x 2   LASER PHOTO ABLATION Right 06/16/2014   Procedure: LASER PHOTO ABLATION;  Surgeon: Hayden Pedro, MD;  Location: [place];  Service: Ophthalmology;  Laterality: Right;  Headscope laser and endolaser    ORCHIECTOMY     right testicle   REPAIR OF COMPLEX TRACTION RETINAL DETACHMENT Right 06/16/2014   Procedure: REPAIR OF COMPLEX TRACTION RETINAL DETACHMENT;  Surgeon: Hayden Pedro, MD;  Location: [facility];  Service: Ophthalmology;  Laterality:  Right;   RETINAL DETACHMENT SURGERY  06/2014   TONSILLECTOMY     VITRECTOMY Right 06/16/2014   WISDOM TOOTH EXTRACTION     Family History  Problem Relation Age of Onset   Parkinson's disease Mother    Arrhythmia Father    Coronary artery disease Other        fhx   Hypertension Other        fhx   Sudden death Other        fhx   Colon cancer Neg Hx    Colon polyps Neg Hx    Esophageal cancer Neg Hx    Rectal cancer Neg Hx    Stomach cancer Neg Hx    Social History   Socioeconomic History   Marital status: Married    Spouse name: Not on file   Number of children: Not on file   Years of education: Not on file   Highest education level: Not on file  Occupational History   Occupation: Solicitor: [employer]  Tobacco Use   Smoking status: Never   Smokeless tobacco: Never  Vaping Use   Vaping Use: Never used  Substance and Sexual Activity   Alcohol use: Yes    Alcohol/week: 0.0 standard drinks    Comment: 2 beers monthly   Drug use: No   Sexual activity: Not on file  Other Topics Concern   Not on file  Social History Narrative   Not on file   Social Determinants of Health   Financial Resource Strain: Low Risk    Difficulty of Paying Living Expenses: Not hard at all  Food Insecurity: No Food Insecurity   Worried About Charity fundraiser in the Last Year: Never true   [place] in the Last Year: Never true  Transportation Needs: No Transportation Needs   Lack of Transportation (Medical): No   Lack of Transportation (Non-Medical): No  Physical Activity: Sufficiently Active   Days of Exercise per Week: 3 days   Minutes of Exercise per Session: 120 min  Stress: No Stress Concern Present   Feeling of Stress : Not at all  Social Connections: Moderately Isolated   Frequency of Communication with Friends and Family: More than three times a week   Frequency of Social Gatherings with Friends and Family: More than three times a week    Attends Religious Services: Never   Marine scientist or Organizations: No   Attends Music therapist: Never   Marital Status: Married    Tobacco Counseling Counseling given: Not Answered   Clinical Intake:  Pre-visit preparation completed: Yes  Pain : No/denies pain     BMI - recorded: 31.95 Nutritional Status: BMI > 30  Obese Nutritional Risks: None Diabetes: No  How often do you need to have someone help you when you read instructions, pamphlets,  or other written materials from your doctor or pharmacy?: 1 - Never  Diabetic?No  Interpreter Needed?: No  Information entered by :: Charlott Rakes, LPN   Activities of Daily Living In your present state of health, do you have any difficulty performing the following activities: 11/03/2020  Hearing? N  Vision? N  Difficulty concentrating or making decisions? N  Walking or climbing stairs? N  Dressing or bathing? N  Doing errands, shopping? N  Preparing Food and eating ? N  Using the Toilet? N  In the past six months, have you accidently leaked urine? N  Do you have problems with loss of bowel control? N  Managing your Medications? N  Managing your Finances? N  Housekeeping or managing your Housekeeping? N  Some recent data might be hidden    Patient Care Team: Laurey Morale, MD as PCP - General (Family Medicine) Jettie Booze, MD as PCP - Cardiology (Cardiology)  Indicate any recent Medical Services you may have received from other than [facility] providers in the past year (date may be approximate).     Assessment:   This is a routine wellness examination for [place].  Hearing/Vision screen Hearing Screening - Comments:: Pt denies any hearing issues  Vision Screening - Comments:: Pt follows up with dr Herbert Deaner for annual eye exams   Dietary issues and exercise activities discussed: Current Exercise Habits: Home exercise routine, Type of exercise: Other - see comments, Time (Minutes): > 60,  Frequency (Times/Week): 3, Weekly Exercise (Minutes/Week): 0   Goals Addressed             This Visit's Progress    Patient Stated       None at this time       Depression Screen PHQ 2/9 Scores 11/03/2020 10/12/2020  PHQ - 2 Score 0 0  PHQ- 9 Score - 1    Fall Risk Fall Risk  11/03/2020 10/07/2020  Falls in the past year? 0 0  Number falls in past yr: 0 0  Injury with Fall? 0 0  Risk for fall due to : - No Fall Risks  Follow up Falls prevention discussed -    FALL [place]:  Any stairs in or around the home? Yes  If so, are there any without handrails? No  Home free of loose throw rugs in walkways, pet beds, electrical cords, etc? Yes  Adequate lighting in your home to reduce risk of falls? Yes   ASSISTIVE DEVICES UTILIZED TO PREVENT FALLS:  Life alert? No  Use of a cane, walker or w/c? No  Grab bars in the bathroom? No  Shower chair or bench in shower? No  Elevated toilet seat or a handicapped toilet? No   TIMED UP AND GO:  Was the test performed? No     Cognitive Function:     6CIT Screen 11/03/2020  What Year? 0 points  What month? 0 points  What time? 0 points  Count back from 20 0 points  Months in reverse 0 points  Repeat phrase 0 points  Total Score 0    Immunizations Immunization History  Administered Date(s) Administered   Fluad Quad(high Dose 65+) 04/08/2020   Influenza,inj,Quad PF,6+ Mos 02/20/2013, 12/03/2013, 02/16/2015, 03/01/2016   PFIZER(Purple Top)SARS-COV-2 Vaccination 07/03/2019, 07/24/2019, 04/08/2020, 06/02/2020    TDAP status: Due, Education has been provided regarding the importance of this vaccine. Advised may receive this vaccine at local pharmacy or Health Dept. Aware to provide a copy of the  vaccination record if obtained from local pharmacy or Health Dept. Verbalized acceptance and understanding.  Flu Vaccine status: Up to date  Pneumococcal vaccine status: Due, Education has been provided  regarding the importance of this vaccine. Advised may receive this vaccine at local pharmacy or Health Dept. Aware to provide a copy of the vaccination record if obtained from local pharmacy or Health Dept. Verbalized acceptance and understanding.  Covid-19 vaccine status: Completed vaccines  Qualifies for Shingles Vaccine? Yes   Zostavax completed No   Shingrix Completed?: No.    Education has been provided regarding the importance of this vaccine. Patient has been advised to call insurance company to determine out of pocket expense if they have not yet received this vaccine. Advised may also receive vaccine at local pharmacy or Health Dept. Verbalized acceptance and understanding.  Screening Tests Health Maintenance  Topic Date Due   TETANUS/TDAP  Never done   Zoster Vaccines- Shingrix (1 of 2) Never done   PNA vac Low Risk Adult (1 of 2 - PCV13) Never done   COVID-19 Vaccine (5 - Booster for Pfizer series) 11/04/2021 (Originally 09/30/2020)   INFLUENZA VACCINE  11/08/2020   COLONOSCOPY (Pts 45-9yr Insurance coverage will need to be confirmed)  02/16/2023   Hepatitis C Screening  Completed   HPV VACCINES  Aged Out    Health Maintenance  Health Maintenance Due  Topic Date Due   TETANUS/TDAP  Never done   Zoster Vaccines- Shingrix (1 of 2) Never done   PNA vac Low Risk Adult (1 of 2 - PCV13) Never done    Colorectal cancer screening: Type of screening: Colonoscopy. Completed 02/16/20. Repeat every 3 years   Additional Screening:  Hepatitis C Screening:  Completed 07/10/19  Vision Screening: Recommended annual ophthalmology exams for early detection of glaucoma and other disorders of the eye. Is the patient up to date with their annual eye exam?  Yes  Who is the provider or what is the name of the office in which the patient attends annual eye exams? Dr HHerbert DeanerIf pt is not established with a provider, would they like to be referred to a provider to establish care? No .   Dental  Screening: Recommended annual dental exams for proper oral hygiene  Community Resource Referral / Chronic Care Management: CRR required this visit?  No   CCM required this visit?  No      Plan:     I have personally reviewed and noted the following in the patient's chart:   Medical and social history Use of alcohol, tobacco or illicit drugs  Current medications and supplements including opioid prescriptions. Patient is not currently taking opioid prescriptions. Functional ability and status Nutritional status Physical activity Advanced directives List of other physicians Hospitalizations, surgeries, and ER visits in previous 12 months Vitals Screenings to include cognitive, depression, and falls Referrals and appointments  In addition, I have reviewed and discussed with patient certain preventive protocols, quality metrics, and best practice recommendations. A written personalized care plan for preventive services as well as general preventive health recommendations were provided to patient.     TWillette Brace LPN   [phone]  Nurse Notes: None

## 2020-11-03 NOTE — Patient Instructions (Addendum)
[name] , Thank you for taking time to come for your Medicare Wellness Visit. I appreciate your ongoing commitment to your health goals. Please review the following plan we discussed and let me know if I can assist you in the future.   Screening recommendations/referrals: Colonoscopy: Done 02/16/20 repeat in 3 years 02/16/23 Recommended yearly ophthalmology/optometry visit for glaucoma screening and checkup Recommended yearly dental visit for hygiene and checkup  Vaccinations: Influenza vaccine: Due after 11/08/20 Pneumococcal vaccine: Due and discussed Tdap vaccine: Due and discussed  Shingles vaccine: Shingrix discussed. Please contact your pharmacy for coverage information.    Covid-19: Completed 3/25, 4/15, 04/08/20 & 06/02/20  Advanced directives: Please bring a copy of your health care power of attorney and living will to the office at your convenience.   Conditions/risks identified: None at this time  Next appointment: Follow up in one year for your annual wellness visit.   Preventive Care 66 Years and Older, Male Preventive care refers to lifestyle choices and visits with your health care provider that can promote health and wellness. What does preventive care include? A yearly physical exam. This is also called an annual well check. Dental exams once or twice a year. Routine eye exams. Ask your health care provider how often you should have your eyes checked. Personal lifestyle choices, including: Daily care of your teeth and gums. Regular physical activity. Eating a healthy diet. Avoiding tobacco and drug use. Limiting alcohol use. Practicing safe sex. Taking low doses of aspirin every day. Taking vitamin and mineral supplements as recommended by your health care provider. What happens during an annual well check? The services and screenings done by your health care provider during your annual well check will depend on your age, overall health, lifestyle risk factors, and  family history of disease. Counseling  Your health care provider may ask you questions about your: Alcohol use. Tobacco use. Drug use. Emotional well-being. Home and relationship well-being. Sexual activity. Eating habits. History of falls. Memory and ability to understand (cognition). Work and work Statistician. Screening  You may have the following tests or measurements: Height, weight, and BMI. Blood pressure. Lipid and cholesterol levels. These may be checked every 5 years, or more frequently if you are over 54 years old. Skin check. Lung cancer screening. You may have this screening every year starting at age 52 if you have a 30-pack-year history of smoking and currently smoke or have quit within the past 15 years. Fecal occult blood test (FOBT) of the stool. You may have this test every year starting at age 80. Flexible sigmoidoscopy or colonoscopy. You may have a sigmoidoscopy every 5 years or a colonoscopy every 10 years starting at age 75. Prostate cancer screening. Recommendations will vary depending on your family history and other risks. Hepatitis C blood test. Hepatitis B blood test. Sexually transmitted disease (STD) testing. Diabetes screening. This is done by checking your blood sugar (glucose) after you have not eaten for a while (fasting). You may have this done every 1-3 years. Abdominal aortic aneurysm (AAA) screening. You may need this if you are a current or former smoker. Osteoporosis. You may be screened starting at age 47 if you are at high risk. Talk with your health care provider about your test results, treatment options, and if necessary, the need for more tests. Vaccines  Your health care provider may recommend certain vaccines, such as: Influenza vaccine. This is recommended every year. Tetanus, diphtheria, and acellular pertussis (Tdap, Td) vaccine. You may need  a Td booster every 10 years. Zoster vaccine. You may need this after age 25. Pneumococcal  13-valent conjugate (PCV13) vaccine. One dose is recommended after age 40. Pneumococcal polysaccharide (PPSV23) vaccine. One dose is recommended after age 69. Talk to your health care provider about which screenings and vaccines you need and how often you need them. This information is not intended to replace advice given to you by your health care provider. Make sure you discuss any questions you have with your health care provider. Document Released: 04/23/2015 Document Revised: 12/15/2015 Document Reviewed: 01/26/2015 Elsevier Interactive Patient Education  2017 [place] Prevention in the Home Falls can cause injuries. They can happen to people of all ages. There are many things you can do to make your home safe and to help prevent falls. What can I do on the outside of my home? Regularly fix the edges of walkways and driveways and fix any cracks. Remove anything that might make you trip as you walk through a door, such as a raised step or threshold. Trim any bushes or trees on the path to your home. Use bright outdoor lighting. Clear any walking paths of anything that might make someone trip, such as rocks or tools. Regularly check to see if handrails are loose or broken. Make sure that both sides of any steps have handrails. Any raised decks and porches should have guardrails on the edges. Have any leaves, snow, or ice cleared regularly. Use sand or salt on walking paths during winter. Clean up any spills in your garage right away. This includes oil or grease spills. What can I do in the bathroom? Use night lights. Install grab bars by the toilet and in the tub and shower. Do not use towel bars as grab bars. Use non-skid mats or decals in the tub or shower. If you need to sit down in the shower, use a plastic, non-slip stool. Keep the floor dry. Clean up any water that spills on the floor as soon as it happens. Remove soap buildup in the tub or shower regularly. Attach  bath mats securely with double-sided non-slip rug tape. Do not have throw rugs and other things on the floor that can make you trip. What can I do in the bedroom? Use night lights. Make sure that you have a light by your bed that is easy to reach. Do not use any sheets or blankets that are too big for your bed. They should not hang down onto the floor. Have a firm chair that has side arms. You can use this for support while you get dressed. Do not have throw rugs and other things on the floor that can make you trip. What can I do in the kitchen? Clean up any spills right away. Avoid walking on wet floors. Keep items that you use a lot in easy-to-reach places. If you need to reach something above you, use a strong step stool that has a grab bar. Keep electrical cords out of the way. Do not use floor polish or wax that makes floors slippery. If you must use wax, use non-skid floor wax. Do not have throw rugs and other things on the floor that can make you trip. What can I do with my stairs? Do not leave any items on the stairs. Make sure that there are handrails on both sides of the stairs and use them. Fix handrails that are broken or loose. Make sure that handrails are as long as the stairways. Check  any carpeting to make sure that it is firmly attached to the stairs. Fix any carpet that is loose or worn. Avoid having throw rugs at the top or bottom of the stairs. If you do have throw rugs, attach them to the floor with carpet tape. Make sure that you have a light switch at the top of the stairs and the bottom of the stairs. If you do not have them, ask someone to add them for you. What else can I do to help prevent falls? Wear shoes that: Do not have high heels. Have rubber bottoms. Are comfortable and fit you well. Are closed at the toe. Do not wear sandals. If you use a stepladder: Make sure that it is fully opened. Do not climb a closed stepladder. Make sure that both sides of the  stepladder are locked into place. Ask someone to hold it for you, if possible. Clearly mark and make sure that you can see: Any grab bars or handrails. First and last steps. Where the edge of each step is. Use tools that help you move around (mobility aids) if they are needed. These include: Canes. Walkers. Scooters. Crutches. Turn on the lights when you go into a dark area. Replace any light bulbs as soon as they burn out. Set up your furniture so you have a clear path. Avoid moving your furniture around. If any of your floors are uneven, fix them. If there are any pets around you, be aware of where they are. Review your medicines with your doctor. Some medicines can make you feel dizzy. This can increase your chance of falling. Ask your doctor what other things that you can do to help prevent falls. This information is not intended to replace advice given to you by your health care provider. Make sure you discuss any questions you have with your health care provider. Document Released: 01/21/2009 Document Revised: 09/02/2015 Document Reviewed: 05/01/2014 Elsevier Interactive Patient Education  2017 [employer].

## 2020-11-19 ENCOUNTER — Encounter: Payer: Self-pay | Admitting: Family Medicine

## 2020-11-19 LAB — HM COLONOSCOPY

## 2020-12-01 ENCOUNTER — Other Ambulatory Visit: Payer: Self-pay

## 2020-12-01 ENCOUNTER — Ambulatory Visit (INDEPENDENT_AMBULATORY_CARE_PROVIDER_SITE_OTHER): Payer: Medicare Other | Admitting: Family Medicine

## 2020-12-01 ENCOUNTER — Encounter: Payer: Self-pay | Admitting: Family Medicine

## 2020-12-01 VITALS — BP 136/74 | HR 66 | Temp 98.1°F | Wt 230.0 lb

## 2020-12-01 DIAGNOSIS — K5791 Diverticulosis of intestine, part unspecified, without perforation or abscess with bleeding: Secondary | ICD-10-CM | POA: Diagnosis not present

## 2020-12-01 NOTE — Progress Notes (Signed)
   Subjective:    Patient ID: [name] [name], male    DOB: 1956/04/02, 65 y.o.   MRN: [id number]  HPI Here to follow up a hospital stay at [facility] from 11-14-20 to 11-19-20 for GI bleeding. He had no pain with this. He suddenly started to have bright red blood per rectum and at the ER his Hgb was 7.6. Over the course of hi stay he was transfused 7 units of PRBC and 2 units of platelets. His platelet count dropped, but this was felt to be a dilutional effect. By his DC, the Hgb was up to 9.3 and platelets were at 117. He had an upper endoscopy, a tagged RBC study, and a capsule endoscopy which gave conflicting reports of whether the bleeding was from the small bowel or the colon. Finally he had a colonoscopy that showed no active bleeding but he had diverticulosis. The final determination was his bleeding was diverticular in origin. Since going home he has not seen any more blood in his stools. No abdominal pain or nausea. His appetite is back to normal and he is eating regularly. He has started using Metamucil every day. He was told to avoid NSAID's. He was also started on Protonix. He basically feels weak now but otherwise okay.    Review of Systems  Constitutional:  Positive for fatigue.  Respiratory: Negative.    Cardiovascular: Negative.   Gastrointestinal: Negative.       Objective:   Physical Exam Constitutional:      Appearance: Normal appearance.  Cardiovascular:     Rate and Rhythm: Normal rate and regular rhythm.     Pulses: Normal pulses.     Heart sounds: Normal heart sounds.  Pulmonary:     Effort: Pulmonary effort is normal.     Breath sounds: Normal breath sounds.  Abdominal:     General: Abdomen is flat. Bowel sounds are normal. There is no distension.     Palpations: Abdomen is soft. There is no mass.     Tenderness: There is no abdominal tenderness. There is no guarding or rebound.     Hernia: No hernia is present.  Neurological:     Mental Status: He is alert.           Assessment & Plan:  He is recovering from a GI bleed, likely from diverticula. He will continue to use Metamucil. We will check a CBC today. He will follow up with GI on 12-16-20. We spent 35 minutes reviewing records and discussing these issues.  Alysia Penna, MD

## 2020-12-02 LAB — CBC WITH DIFFERENTIAL/PLATELET
Basophils Absolute: 0.1 10*3/uL (ref 0.0–0.1)
Basophils Relative: 1.5 % (ref 0.0–3.0)
Eosinophils Absolute: 0.2 10*3/uL (ref 0.0–0.7)
Eosinophils Relative: 3.5 % (ref 0.0–5.0)
HCT: 31.2 % — ABNORMAL LOW (ref 39.0–52.0)
Hemoglobin: 10.5 g/dL — ABNORMAL LOW (ref 13.0–17.0)
Lymphocytes Relative: 17.5 % (ref 12.0–46.0)
Lymphs Abs: 0.9 10*3/uL (ref 0.7–4.0)
MCHC: 33.6 g/dL (ref 30.0–36.0)
MCV: 84.4 fl (ref 78.0–100.0)
Monocytes Absolute: 0.5 10*3/uL (ref 0.1–1.0)
Monocytes Relative: 11 % (ref 3.0–12.0)
Neutro Abs: 3.2 10*3/uL (ref 1.4–7.7)
Neutrophils Relative %: 66.5 % (ref 43.0–77.0)
Platelets: 141 10*3/uL — ABNORMAL LOW (ref 150.0–400.0)
RBC: 3.7 Mil/uL — ABNORMAL LOW (ref 4.22–5.81)
RDW: 15.4 % (ref 11.5–15.5)
WBC: 4.9 10*3/uL (ref 4.0–10.5)

## 2020-12-03 ENCOUNTER — Other Ambulatory Visit: Payer: Self-pay

## 2020-12-03 DIAGNOSIS — D696 Thrombocytopenia, unspecified: Secondary | ICD-10-CM

## 2020-12-15 ENCOUNTER — Encounter: Payer: Self-pay | Admitting: Family Medicine

## 2020-12-15 DIAGNOSIS — M25551 Pain in right hip: Secondary | ICD-10-CM

## 2020-12-16 NOTE — Telephone Encounter (Signed)
I suggest he see Dr. Dorna Leitz at [facility]. I will put in a referral

## 2021-01-03 ENCOUNTER — Other Ambulatory Visit (INDEPENDENT_AMBULATORY_CARE_PROVIDER_SITE_OTHER): Payer: Medicare Other

## 2021-01-03 ENCOUNTER — Other Ambulatory Visit: Payer: Self-pay

## 2021-01-03 DIAGNOSIS — D696 Thrombocytopenia, unspecified: Secondary | ICD-10-CM

## 2021-01-03 LAB — CBC WITH DIFFERENTIAL/PLATELET
Basophils Absolute: 0.1 10*3/uL (ref 0.0–0.1)
Basophils Relative: 1.2 % (ref 0.0–3.0)
Eosinophils Absolute: 0.2 10*3/uL (ref 0.0–0.7)
Eosinophils Relative: 4.3 % (ref 0.0–5.0)
HCT: 37.5 % — ABNORMAL LOW (ref 39.0–52.0)
Hemoglobin: 12.3 g/dL — ABNORMAL LOW (ref 13.0–17.0)
Lymphocytes Relative: 19.2 % (ref 12.0–46.0)
Lymphs Abs: 0.9 10*3/uL (ref 0.7–4.0)
MCHC: 32.9 g/dL (ref 30.0–36.0)
MCV: 76.2 fl — ABNORMAL LOW (ref 78.0–100.0)
Monocytes Absolute: 0.5 10*3/uL (ref 0.1–1.0)
Monocytes Relative: 10.3 % (ref 3.0–12.0)
Neutro Abs: 3.1 10*3/uL (ref 1.4–7.7)
Neutrophils Relative %: 65 % (ref 43.0–77.0)
Platelets: 177 10*3/uL (ref 150.0–400.0)
RBC: 4.92 Mil/uL (ref 4.22–5.81)
RDW: 16.5 % — ABNORMAL HIGH (ref 11.5–15.5)
WBC: 4.8 10*3/uL (ref 4.0–10.5)

## 2021-01-07 ENCOUNTER — Other Ambulatory Visit: Payer: Self-pay

## 2021-01-07 MED ORDER — IRON (FERROUS SULFATE) 325 (65 FE) MG PO TABS: 325.0000 mg | ORAL_TABLET | Freq: Every day | ORAL | 3 refills | Status: DC

## 2021-01-28 ENCOUNTER — Other Ambulatory Visit: Payer: Self-pay | Admitting: Interventional Cardiology

## 2021-01-28 DIAGNOSIS — I700 Atherosclerosis of aorta: Secondary | ICD-10-CM

## 2021-03-17 NOTE — Progress Notes (Signed)
[facility] Note  03/21/2021     CHIEF COMPLAINT Patient presents for Retina Evaluation   HISTORY OF PRESENT ILLNESS: [name] [name] is a 65 y.o. male who presents to the clinic today for:   HPI     Retina Evaluation   In right eye.  Associated Symptoms Floaters.  Negative for Flashes, Distortion, Blind Spot, Pain, Redness, Photophobia, Glare, Trauma, Scalp Tenderness, Jaw Claudication, Shoulder/Hip pain, Fever, Weight Loss and Fatigue.  Context:  distance vision, mid-range vision and near vision.  Treatments tried include no treatments.  I, the attending physician,  performed the HPI with the patient and updated documentation appropriately.        Comments   Patient referred by Dr. Herbert Deaner to re-eval retina. Patient has history of CME OD following RD repair by Dr. Zigmund Daniel in 2016. Last saw Dr. Coralyn Pear on 08.13.20. Was on Pred Forte and prolensa for CME in the past, but no gtt treatment for at least 2 years. Patient has occasional floaters OU. Floaters are not new and not worsening. No flashes. Patient reports good vision OU.       Last edited by Bernarda Caffey, MD on 03/21/2021  8:40 AM.    pt states his vision is doing well, he states for 3-4 days after his last visit he saw a spider web floater in his vision  Referring physician: Monna Fam, MD [place],  [place] [address]  HISTORICAL INFORMATION:   Selected notes from the [facility] Referred by Dr. Zigmund Daniel for possible RD OD;  LEE- 06.14.16 (JDM) [BCVA OD: 20/40+2 OS: 20/20-1] Ocular Hx- S/P laser break OD, HTN ret OU, S/P 23/25g PPV, laser, C3F8 OD (03.08.16 -JDM), pseudophakia OU PMH- HTN    CURRENT MEDICATIONS: No current outpatient medications on file. (Ophthalmic Drugs)   No current facility-administered medications for this visit. (Ophthalmic Drugs)   Current Outpatient Medications (Other)  Medication Sig   amLODipine (NORVASC) 5 MG tablet TAKE 1  TABLET BY MOUTH EVERY DAY   lisinopril-hydrochlorothiazide (ZESTORETIC) 20-12.5 MG tablet TAKE 1 TABLET BY MOUTH EVERY DAY   rosuvastatin (CRESTOR) 40 MG tablet TAKE 1 TABLET BY MOUTH EVERY DAY   Iron, Ferrous Sulfate, 325 (65 Fe) MG TABS Take 325 mg by mouth daily. (Patient not taking: Reported on 03/21/2021)   pantoprazole (PROTONIX) 40 MG tablet Take 40 mg by mouth daily.   No current facility-administered medications for this visit. (Other)   REVIEW OF SYSTEMS: ROS   Positive for: Musculoskeletal, Eyes Negative for: Constitutional, Gastrointestinal, Neurological, Skin, Genitourinary, HENT, Endocrine, Cardiovascular, Respiratory, Psychiatric, Allergic/Imm, Heme/Lymph Last edited by Roselee Nova D, COT on 03/21/2021  8:16 AM.     ALLERGIES No Known Allergies  PAST MEDICAL HISTORY Past Medical History:  Diagnosis Date   Ankle fracture    Cancer ([place]) 1974   testicular cancer-at age 10   Cataract    bilateral sx   GERD (gastroesophageal reflux disease)    hx of   Hx of colonic polyps    pt unsure,thinks this was done in 1982 in [place]. no reports in EPIC   Hyperlipidemia    on meds   Hypertension    on meds   Kidney stones    Patella fracture    Retinal detachment    OD   Retinal tear of right eye    Syncopal episodes 2008   Past Surgical History:  Procedure Laterality Date   AIR/FLUID EXCHANGE Right 06/16/2014  Procedure: AIR/FLUID EXCHANGE;  Surgeon: John D Matthews, MD;  Location: [facility] OR;  Service: Ophthalmology;  Laterality: Right;   CATARACT EXTRACTION     CATARACT EXTRACTION  09/2014   rt eye//left 01/2015   CATARACT EXTRACTION, BILATERAL  2016   per Dr. Hecker    COLONOSCOPY  02/16/2020   per Dr. Pyrtle, adenomatous polyps, repeat in 3 yrs   EYE SURGERY     GAS INSERTION Right 06/16/2014   Procedure: INSERTION OF GAS;  Surgeon: John D Matthews, MD;  Location: [facility] OR;  Service: Ophthalmology;  Laterality: Right;  C3F8   HYDROCELE EXCISION     KNEE ARTHROSCOPY  Right    x 2   LASER PHOTO ABLATION Right 06/16/2014   Procedure: LASER PHOTO ABLATION;  Surgeon: John D Matthews, MD;  Location: [facility] OR;  Service: Ophthalmology;  Laterality: Right;  Headscope laser and endolaser    ORCHIECTOMY     right testicle   REPAIR OF COMPLEX TRACTION RETINAL DETACHMENT Right 06/16/2014   Procedure: REPAIR OF COMPLEX TRACTION RETINAL DETACHMENT;  Surgeon: John D Matthews, MD;  Location: [facility] OR;  Service: Ophthalmology;  Laterality: Right;   RETINAL DETACHMENT SURGERY  06/2014   TONSILLECTOMY     VITRECTOMY Right 06/16/2014   WISDOM TOOTH EXTRACTION      FAMILY HISTORY Family History  Problem Relation Age of Onset   Parkinson's disease Mother    Arrhythmia Father    Coronary artery disease Other        fhx   Hypertension Other        fhx   Sudden death Other        fhx   Colon cancer Neg Hx    Colon polyps Neg Hx    Esophageal cancer Neg Hx    Rectal cancer Neg Hx    Stomach cancer Neg Hx     SOCIAL HISTORY Social History   Tobacco Use   Smoking status: Never   Smokeless tobacco: Never  Vaping Use   Vaping Use: Never used  Substance Use Topics   Alcohol use: Yes    Alcohol/week: 0.0 standard drinks    Comment: 2 beers monthly   Drug use: No       OPHTHALMIC EXAM:  Base Eye Exam     Visual Acuity (Snellen - Linear)       Right Left   Dist [place] 20/25 -2 20/40 -1   Dist ph [place] 20/20 20/20         Tonometry (Tonopen, 8:34 AM)       Right Left   Pressure 17 12         Pupils       Dark Light Shape React APD   Right 4 3 Round Brisk None   Left 3 2 Round Brisk None         Visual Fields (Counting fingers)       Left Right    Full Full         Extraocular Movement       Right Left    Full, Ortho Full, Ortho         Neuro/Psych     Oriented x3: Yes   Mood/Affect: Normal         Dilation     Both eyes: 1.0% Mydriacyl, 2.5% Phenylephrine @ 8:34 AM           Slit Lamp and Fundus Exam     Slit Lamp  Exam         Right Left   Lids/Lashes Dermatochalasis - upper lid, Telangiectasia, Meibomian gland dysfunction Dermatochalasis - upper lid, Telangiectasia, Meibomian gland dysfunction   Conjunctiva/Sclera White and quiet White and quiet   Cornea mild arcus, 1+ Punctate epithelial erosions, Debris in tear film mild arcus, 1+ inferior Punctate epithelial erosions, , Debris in tear film   Anterior Chamber Deep and quiet, no cell/flare Deep and quiet, no cell or flare   Iris Round and dilated Round and dilated   Lens PC IOL in good position with open PC PC IOL in good position with open PC   Anterior Vitreous Post vitrectomy and clear Vitreous syneresis, Posterior vitreous detachment, vitreous condensations         Fundus Exam       Right Left   Disc Pink and Sharp Pink and Sharp   C/D Ratio 0.2 0.2   Macula Flat, Good foveal reflex, no CME Flat, Good foveal reflex, No heme or edema   Vessels Vascular attenuation and Totuous Vascular attenuation and Tortuous, mild Copper wiring, mild, AV crossing changes   Periphery Attached 360, 360 laser scarring, No RT/RD on 360 peripheral exam Attached           Refraction     Manifest Refraction       Sphere Cylinder Axis Dist VA   Right -0.50 +0.75 180 20/20-1   Left -1.00 +0.50 160 20/20-1            IMAGING AND PROCEDURES  Imaging and Procedures for 07/17/17  OCT, Retina - OU - Both Eyes       Right Eye Quality was good. Central Foveal Thickness: 304. Progression has been stable. Findings include no SRF, no IRF, normal foveal contour (No CME/IRF ).   Left Eye Quality was good. Central Foveal Thickness: 277. Progression has been stable. Findings include normal foveal contour, no IRF, no SRF.   Notes *Images captured and stored on drive  Diagnosis / Impression:  OD: NFP; no CME/IRF/SRF OS: NFP, no IRF/SRF  Clinical management:  See below  Abbreviations: NFP - Normal foveal profile. CME - cystoid macular edema. PED -  pigment epithelial detachment. IRF - intraretinal fluid. SRF - subretinal fluid. EZ - ellipsoid zone. ERM - epiretinal membrane. ORA - outer retinal atrophy. ORT - outer retinal tubulation. SRHM - subretinal hyper-reflective material             ASSESSMENT/PLAN:    ICD-10-CM   1. Cystoid macular edema of right eye  H35.351     2. Retinal edema  H35.81 OCT, Retina - OU - Both Eyes    3. Hx of retinal detachment  Z86.69     4. Posterior vitreous detachment of left eye  H43.812     5. Pseudophakia of both eyes  Z96.1     6. Ocular hypertension of right eye  H40.051      1,2. History of CME OD   - re-referred by Hecker after being lost to being lost to f/u since 2020.   - recurrent CME after YAG OD with Dr. Hecker on 3.12.2020  - history of successful treatment for CME / [place] Gass OD Feb 2019 topically (PF and ketorolac)  - FA at initial visit on 02.28.19 with mild late petaloid leakage in fovea OD  - today, OCT shows no CME/IRF/SRF             - BCVA remains 20/20 OD             - IOP   17 today  - clear from a retina standpoint for primary eye care f/u at [facility]  - f/u here PRN   3. History of RD repair OD-   - S/P PPV/laser by expert surgeon, Dr. Tempie Hoist (03.08.16)  - retina attached 360 with good 360 laser in place  - No new RT/RD on exam  - monitor  4. PVD / vitreous syneresis OS-   - Discussed findings and prognosis  - No RT or RD on 360 scleral depressed exam  - Reviewed s/s of RT/RD  - Strict return precautions for any such RT/RD symptoms  5. Pseudophakia OU  - s/p CE/IOL OU (2016)  - s/p YAG OD 2020  - s/p YAG OS   - history of [place] Gass after YAG OD  - monitor  6. Ocular hypertension OD -- improved  - IOP 17 today off Cosopt  - likely prior steroid response  - monitor  Ophthalmic Meds Ordered this visit:  No orders of the defined types were placed in this encounter.    Return if symptoms worsen or fail to improve.  There are no  Patient Instructions on file for this visit.   Explained the diagnoses, plan, and follow up with the patient and they expressed understanding.  Patient expressed understanding of the importance of proper follow up care.   This document serves as a record of services personally performed by Gardiner Sleeper, MD, PhD. It was created on their behalf by Roselee Nova, COMT. The creation of this record is the provider's dictation and/or activities during the visit.  Electronically signed by: Roselee Nova, COMT 03/21/21 9:04 AM   This document serves as a record of services personally performed by Gardiner Sleeper, MD, PhD. It was created on their behalf by Leonie Douglas, an ophthalmic technician. The creation of this record is the provider's dictation and/or activities during the visit.    Electronically signed by: Leonie Douglas COA, 03/21/21  9:04 AM  Gardiner Sleeper, M.D., Ph.D. Diseases & Surgery of the Retina and Vitreous [facility]  I have reviewed the above documentation for accuracy and completeness, and I agree with the above. Gardiner Sleeper, M.D., Ph.D. 03/21/21 9:04 AM  Abbreviations: M myopia (nearsighted); A astigmatism; H hyperopia (farsighted); P presbyopia; Mrx spectacle prescription;  CTL contact lenses; OD right eye; OS left eye; OU both eyes  XT exotropia; ET esotropia; PEK punctate epithelial keratitis; PEE punctate epithelial erosions; DES dry eye syndrome; MGD meibomian gland dysfunction; ATs artificial tears; PFAT's preservative free artificial tears; [place] nuclear sclerotic cataract; PSC posterior subcapsular cataract; ERM epi-retinal membrane; PVD posterior vitreous detachment; RD retinal detachment; DM diabetes mellitus; DR diabetic retinopathy; NPDR non-proliferative diabetic retinopathy; PDR proliferative diabetic retinopathy; CSME clinically significant macular edema; DME diabetic macular edema; dbh dot blot hemorrhages; CWS cotton wool spot; POAG primary  open angle glaucoma; C/D cup-to-disc ratio; HVF humphrey visual field; GVF goldmann visual field; OCT optical coherence tomography; IOP intraocular pressure; BRVO Branch retinal vein occlusion; CRVO central retinal vein occlusion; CRAO central retinal artery occlusion; BRAO branch retinal artery occlusion; RT retinal tear; SB scleral buckle; PPV pars plana vitrectomy; VH Vitreous hemorrhage; PRP panretinal laser photocoagulation; IVK intravitreal kenalog; VMT vitreomacular traction; MH Macular hole;  NVD neovascularization of the disc; NVE neovascularization elsewhere; AREDS age related eye disease study; ARMD age related macular degeneration; POAG primary open angle glaucoma; EBMD epithelial/anterior basement membrane dystrophy; ACIOL anterior chamber intraocular lens; IOL intraocular lens; PCIOL  posterior chamber intraocular lens; Phaco/IOL phacoemulsification with intraocular lens placement; [place] photorefractive keratectomy; LASIK laser assisted in situ keratomileusis; HTN hypertension; DM diabetes mellitus; COPD chronic obstructive pulmonary disease

## 2021-03-21 ENCOUNTER — Ambulatory Visit (INDEPENDENT_AMBULATORY_CARE_PROVIDER_SITE_OTHER): Payer: Medicare Other | Admitting: Ophthalmology

## 2021-03-21 ENCOUNTER — Encounter (INDEPENDENT_AMBULATORY_CARE_PROVIDER_SITE_OTHER): Payer: Self-pay | Admitting: Ophthalmology

## 2021-03-21 ENCOUNTER — Other Ambulatory Visit: Payer: Self-pay

## 2021-03-21 DIAGNOSIS — Z961 Presence of intraocular lens: Secondary | ICD-10-CM

## 2021-03-21 DIAGNOSIS — H35351 Cystoid macular degeneration, right eye: Secondary | ICD-10-CM

## 2021-03-21 DIAGNOSIS — H40051 Ocular hypertension, right eye: Secondary | ICD-10-CM

## 2021-03-21 DIAGNOSIS — H43812 Vitreous degeneration, left eye: Secondary | ICD-10-CM | POA: Diagnosis not present

## 2021-03-21 DIAGNOSIS — H26492 Other secondary cataract, left eye: Secondary | ICD-10-CM

## 2021-03-21 DIAGNOSIS — H3581 Retinal edema: Secondary | ICD-10-CM

## 2021-03-21 DIAGNOSIS — Z8669 Personal history of other diseases of the nervous system and sense organs: Secondary | ICD-10-CM

## 2021-04-01 ENCOUNTER — Inpatient Hospital Stay (HOSPITAL_COMMUNITY): Payer: Medicare Other

## 2021-04-01 ENCOUNTER — Inpatient Hospital Stay (HOSPITAL_COMMUNITY)
Admission: EM | Admit: 2021-04-01 | Discharge: 2021-04-05 | DRG: 378 | Disposition: A | Discharge status: Other Acute Inpatient Hospital | Payer: Medicare Other | Attending: Internal Medicine | Admitting: Internal Medicine

## 2021-04-01 ENCOUNTER — Encounter (HOSPITAL_COMMUNITY): Payer: Self-pay | Admitting: Internal Medicine

## 2021-04-01 DIAGNOSIS — Z79899 Other long term (current) drug therapy: Secondary | ICD-10-CM | POA: Diagnosis not present

## 2021-04-01 DIAGNOSIS — K5731 Diverticulosis of large intestine without perforation or abscess with bleeding: Principal | ICD-10-CM | POA: Diagnosis present

## 2021-04-01 DIAGNOSIS — K449 Diaphragmatic hernia without obstruction or gangrene: Secondary | ICD-10-CM | POA: Diagnosis present

## 2021-04-01 DIAGNOSIS — K921 Melena: Secondary | ICD-10-CM | POA: Diagnosis not present

## 2021-04-01 DIAGNOSIS — E785 Hyperlipidemia, unspecified: Secondary | ICD-10-CM | POA: Diagnosis present

## 2021-04-01 DIAGNOSIS — Z9842 Cataract extraction status, left eye: Secondary | ICD-10-CM

## 2021-04-01 DIAGNOSIS — R933 Abnormal findings on diagnostic imaging of other parts of digestive tract: Secondary | ICD-10-CM | POA: Diagnosis not present

## 2021-04-01 DIAGNOSIS — R718 Other abnormality of red blood cells: Secondary | ICD-10-CM | POA: Diagnosis present

## 2021-04-01 DIAGNOSIS — K269 Duodenal ulcer, unspecified as acute or chronic, without hemorrhage or perforation: Secondary | ICD-10-CM | POA: Diagnosis present

## 2021-04-01 DIAGNOSIS — K922 Gastrointestinal hemorrhage, unspecified: Secondary | ICD-10-CM

## 2021-04-01 DIAGNOSIS — D62 Acute posthemorrhagic anemia: Secondary | ICD-10-CM | POA: Diagnosis present

## 2021-04-01 DIAGNOSIS — D175 Benign lipomatous neoplasm of intra-abdominal organs: Secondary | ICD-10-CM | POA: Diagnosis present

## 2021-04-01 DIAGNOSIS — K2970 Gastritis, unspecified, without bleeding: Secondary | ICD-10-CM | POA: Diagnosis present

## 2021-04-01 DIAGNOSIS — K3189 Other diseases of stomach and duodenum: Secondary | ICD-10-CM | POA: Diagnosis not present

## 2021-04-01 DIAGNOSIS — I951 Orthostatic hypotension: Secondary | ICD-10-CM | POA: Diagnosis present

## 2021-04-01 DIAGNOSIS — K219 Gastro-esophageal reflux disease without esophagitis: Secondary | ICD-10-CM | POA: Diagnosis present

## 2021-04-01 DIAGNOSIS — I700 Atherosclerosis of aorta: Secondary | ICD-10-CM | POA: Diagnosis present

## 2021-04-01 DIAGNOSIS — Z87442 Personal history of urinary calculi: Secondary | ICD-10-CM

## 2021-04-01 DIAGNOSIS — I10 Essential (primary) hypertension: Secondary | ICD-10-CM | POA: Diagnosis present

## 2021-04-01 DIAGNOSIS — Z20822 Contact with and (suspected) exposure to covid-19: Secondary | ICD-10-CM | POA: Diagnosis present

## 2021-04-01 DIAGNOSIS — Z8249 Family history of ischemic heart disease and other diseases of the circulatory system: Secondary | ICD-10-CM | POA: Diagnosis not present

## 2021-04-01 DIAGNOSIS — Z8719 Personal history of other diseases of the digestive system: Secondary | ICD-10-CM | POA: Diagnosis not present

## 2021-04-01 DIAGNOSIS — K259 Gastric ulcer, unspecified as acute or chronic, without hemorrhage or perforation: Secondary | ICD-10-CM | POA: Diagnosis present

## 2021-04-01 DIAGNOSIS — K639 Disease of intestine, unspecified: Secondary | ICD-10-CM | POA: Diagnosis not present

## 2021-04-01 DIAGNOSIS — Z8547 Personal history of malignant neoplasm of testis: Secondary | ICD-10-CM | POA: Diagnosis not present

## 2021-04-01 DIAGNOSIS — Z9841 Cataract extraction status, right eye: Secondary | ICD-10-CM

## 2021-04-01 DIAGNOSIS — K2980 Duodenitis without bleeding: Secondary | ICD-10-CM | POA: Diagnosis present

## 2021-04-01 DIAGNOSIS — K50011 Crohn's disease of small intestine with rectal bleeding: Secondary | ICD-10-CM | POA: Diagnosis not present

## 2021-04-01 DIAGNOSIS — K625 Hemorrhage of anus and rectum: Secondary | ICD-10-CM | POA: Diagnosis present

## 2021-04-01 LAB — COMPREHENSIVE METABOLIC PANEL
ALT: 16 U/L (ref 0–44)
AST: 25 U/L (ref 15–41)
Albumin: 3.1 g/dL — ABNORMAL LOW (ref 3.5–5.0)
Alkaline Phosphatase: 41 U/L (ref 38–126)
Anion gap: 7 (ref 5–15)
BUN: 34 mg/dL — ABNORMAL HIGH (ref 8–23)
CO2: 22 mmol/L (ref 22–32)
Calcium: 8.2 mg/dL — ABNORMAL LOW (ref 8.9–10.3)
Chloride: 107 mmol/L (ref 98–111)
Creatinine, Ser: 1.03 mg/dL (ref 0.61–1.24)
GFR, Estimated: >60 mL/min (ref >60)
Glucose, Bld: 109 mg/dL — ABNORMAL HIGH (ref 70–99)
Potassium: 4.2 mmol/L (ref 3.5–5.1)
Sodium: 136 mmol/L (ref 135–145)
Total Bilirubin: 0.7 mg/dL (ref 0.3–1.2)
Total Protein: 5 g/dL — ABNORMAL LOW (ref 6.5–8.1)

## 2021-04-01 LAB — CBC
HCT: 36.4 % — ABNORMAL LOW (ref 39.0–52.0)
HCT: 35.1 % — ABNORMAL LOW (ref 39.0–52.0)
HCT: 34.1 % — ABNORMAL LOW (ref 39.0–52.0)
HCT: 30.7 % — ABNORMAL LOW (ref 39.0–52.0)
Hemoglobin: 11.9 g/dL — ABNORMAL LOW (ref 13.0–17.0)
Hemoglobin: 11.2 g/dL — ABNORMAL LOW (ref 13.0–17.0)
Hemoglobin: 11.3 g/dL — ABNORMAL LOW (ref 13.0–17.0)
Hemoglobin: 10 g/dL — ABNORMAL LOW (ref 13.0–17.0)
MCH: 25.1 pg — ABNORMAL LOW (ref 26.0–34.0)
MCH: 24.8 pg — ABNORMAL LOW (ref 26.0–34.0)
MCH: 25.2 pg — ABNORMAL LOW (ref 26.0–34.0)
MCH: 25.1 pg — ABNORMAL LOW (ref 26.0–34.0)
MCHC: 32.7 g/dL (ref 30.0–36.0)
MCHC: 31.9 g/dL (ref 30.0–36.0)
MCHC: 33.1 g/dL (ref 30.0–36.0)
MCHC: 32.6 g/dL (ref 30.0–36.0)
MCV: 76.6 fL — ABNORMAL LOW (ref 80.0–100.0)
MCV: 77.7 fL — ABNORMAL LOW (ref 80.0–100.0)
MCV: 75.9 fL — ABNORMAL LOW (ref 80.0–100.0)
MCV: 77.1 fL — ABNORMAL LOW (ref 80.0–100.0)
Platelets: 153 10*3/uL (ref 150–400)
Platelets: 150 10*3/uL (ref 150–400)
Platelets: 155 10*3/uL (ref 150–400)
Platelets: 144 10*3/uL — ABNORMAL LOW (ref 150–400)
RBC: 4.75 MIL/uL (ref 4.22–5.81)
RBC: 4.52 MIL/uL (ref 4.22–5.81)
RBC: 4.49 MIL/uL (ref 4.22–5.81)
RBC: 3.98 MIL/uL — ABNORMAL LOW (ref 4.22–5.81)
RDW: 17.2 % — ABNORMAL HIGH (ref 11.5–15.5)
RDW: 17.3 % — ABNORMAL HIGH (ref 11.5–15.5)
RDW: 17.4 % — ABNORMAL HIGH (ref 11.5–15.5)
RDW: 17.6 % — ABNORMAL HIGH (ref 11.5–15.5)
WBC: 6.3 10*3/uL (ref 4.0–10.5)
WBC: 7.5 10*3/uL (ref 4.0–10.5)
WBC: 6 10*3/uL (ref 4.0–10.5)
WBC: 6.4 10*3/uL (ref 4.0–10.5)
nRBC: 0 % (ref 0.0–0.2)
nRBC: 0 % (ref 0.0–0.2)
nRBC: 0 % (ref 0.0–0.2)
nRBC: 0 % (ref 0.0–0.2)

## 2021-04-01 LAB — URINALYSIS, ROUTINE W REFLEX MICROSCOPIC
Bacteria, UA: NONE SEEN
Bilirubin Urine: NEGATIVE
Glucose, UA: NEGATIVE mg/dL
Ketones, ur: NEGATIVE mg/dL
Leukocytes,Ua: NEGATIVE
Nitrite: NEGATIVE
Protein, ur: NEGATIVE mg/dL
Specific Gravity, Urine: 1.014 (ref 1.005–1.030)
pH: 5 (ref 5.0–8.0)

## 2021-04-01 LAB — CBC WITH DIFFERENTIAL/PLATELET
Abs Immature Granulocytes: 0.05 10*3/uL (ref 0.00–0.07)
Basophils Absolute: 0.1 10*3/uL (ref 0.0–0.1)
Basophils Relative: 1 %
Eosinophils Absolute: 0.1 10*3/uL (ref 0.0–0.5)
Eosinophils Relative: 1 %
HCT: 36.8 % — ABNORMAL LOW (ref 39.0–52.0)
Hemoglobin: 11.6 g/dL — ABNORMAL LOW (ref 13.0–17.0)
Immature Granulocytes: 1 %
Lymphocytes Relative: 9 %
Lymphs Abs: 0.7 10*3/uL (ref 0.7–4.0)
MCH: 24.9 pg — ABNORMAL LOW (ref 26.0–34.0)
MCHC: 31.5 g/dL (ref 30.0–36.0)
MCV: 79 fL — ABNORMAL LOW (ref 80.0–100.0)
Monocytes Absolute: 0.6 10*3/uL (ref 0.1–1.0)
Monocytes Relative: 8 %
Neutro Abs: 6 10*3/uL (ref 1.7–7.7)
Neutrophils Relative %: 80 %
Platelets: 155 10*3/uL (ref 150–400)
RBC: 4.66 MIL/uL (ref 4.22–5.81)
RDW: 17.3 % — ABNORMAL HIGH (ref 11.5–15.5)
WBC: 7.5 10*3/uL (ref 4.0–10.5)
nRBC: 0 % (ref 0.0–0.2)

## 2021-04-01 LAB — IRON AND TIBC
Iron: 59 ug/dL (ref 45–182)
Saturation Ratios: 17 % — ABNORMAL LOW (ref 17.9–39.5)
TIBC: 343 ug/dL (ref 250–450)
UIBC: 284 ug/dL

## 2021-04-01 LAB — RETICULOCYTES
Immature Retic Fract: 21.9 % — ABNORMAL HIGH (ref 2.3–15.9)
RBC.: 4.43 MIL/uL (ref 4.22–5.81)
Retic Count, Absolute: 91.3 10*3/uL (ref 19.0–186.0)
Retic Ct Pct: 2.1 % (ref 0.4–3.1)

## 2021-04-01 LAB — CBG MONITORING, ED: Glucose-Capillary: 107 mg/dL — ABNORMAL HIGH (ref 70–99)

## 2021-04-01 LAB — FOLATE: Folate: 12.5 ng/mL (ref >5.9)

## 2021-04-01 LAB — RESP PANEL BY RT-PCR (FLU A&B, COVID) ARPGX2
Influenza A by PCR: NEGATIVE
Influenza B by PCR: NEGATIVE
SARS Coronavirus 2 by RT PCR: NEGATIVE

## 2021-04-01 LAB — POC OCCULT BLOOD, ED: Fecal Occult Bld: POSITIVE — AB

## 2021-04-01 LAB — PREPARE RBC (CROSSMATCH)

## 2021-04-01 LAB — HIV ANTIBODY (ROUTINE TESTING W REFLEX): HIV Screen 4th Generation wRfx: NONREACTIVE

## 2021-04-01 LAB — ABO/RH: ABO/RH(D): A POS

## 2021-04-01 LAB — VITAMIN B12: Vitamin B-12: 459 pg/mL (ref 180–914)

## 2021-04-01 LAB — LACTIC ACID, PLASMA: Lactic Acid, Venous: 1.1 mmol/L (ref 0.5–1.9)

## 2021-04-01 LAB — FERRITIN: Ferritin: 13 ng/mL — ABNORMAL LOW (ref 24–336)

## 2021-04-01 MED ORDER — SODIUM CHLORIDE 0.9 % IV SOLN: 10.0000 mL/h | Freq: Once | INTRAVENOUS | Status: DC

## 2021-04-01 MED ORDER — POLYETHYLENE GLYCOL 3350 17 GM/SCOOP PO POWD
1.0000 | Freq: Once | ORAL | Status: AC
  Administered 2021-04-01: 18:00:00 255 g via ORAL
  Filled 2021-04-01 (×2): qty 255

## 2021-04-01 MED ORDER — PANTOPRAZOLE SODIUM 40 MG IV SOLR: 40.0000 mg | Freq: Two times a day (BID) | INTRAVENOUS | Status: DC

## 2021-04-01 MED ORDER — METOCLOPRAMIDE HCL 5 MG/ML IJ SOLN
10.0000 mg | Freq: Once | INTRAMUSCULAR | Status: AC
  Administered 2021-04-01: 21:00:00 10 mg via INTRAVENOUS
  Filled 2021-04-01: qty 2

## 2021-04-01 MED ORDER — ACETAMINOPHEN 650 MG RE SUPP: 650.0000 mg | Freq: Four times a day (QID) | RECTAL | Status: DC | PRN

## 2021-04-01 MED ORDER — ACETAMINOPHEN 325 MG PO TABS: 650.0000 mg | ORAL_TABLET | Freq: Four times a day (QID) | ORAL | Status: DC | PRN

## 2021-04-01 MED ORDER — HYDRALAZINE HCL 20 MG/ML IJ SOLN: 10.0000 mg | Freq: Four times a day (QID) | INTRAMUSCULAR | Status: DC | PRN

## 2021-04-01 MED ORDER — IOHEXOL 350 MG/ML SOLN
100.0000 mL | Freq: Once | INTRAVENOUS | Status: AC | PRN
  Administered 2021-04-01: 11:00:00 100 mL via INTRAVENOUS

## 2021-04-01 MED ORDER — LACTATED RINGERS IV SOLN: INTRAVENOUS | Status: AC

## 2021-04-01 MED ORDER — LACTATED RINGERS IV BOLUS
1000.0000 mL | Freq: Once | INTRAVENOUS | Status: AC
  Administered 2021-04-01: 02:00:00 1000 mL via INTRAVENOUS

## 2021-04-01 NOTE — Progress Notes (Signed)
Received pt via stretcher from ED acccompanied by stafff nurse, Pt alert/oriented in no apparent distress. Pt orientated to room /equipments. Menu provided with instructions. And hospital valuables policy has been discussed with no complaints.Hospital bed with 3 side rails up, callbell/roomphone within reach and all wheels locked.

## 2021-04-01 NOTE — Consult Note (Addendum)
[facility] Consult: 10:09 AM 04/01/2021  LOS: 0 days    Referring Provider: Dr Candiss Norse  Primary Care Physician:  Laurey Morale, MD Primary Gastroenterologist: Rogelio Seen in [place].   Dr Hilarie Fredrickson.      Reason for Consultation: Painless hematochezia   HPI: [name] [name] is a 65 y.o. male.  PMH remote testicular cancer.  Nephrolithiasis.  Hypertension.  Colon polyps.  GI bleed. 11/2019 colonoscopy by Dr. Hilarie Fredrickson for polyp surveillance.  Multiple polyps on colonoscopies 2014, 2017.  6 polyps ranging in size from 2 to 5 mm removed.  Small lipomas in descending, a sending colon.  Multiple sigmoid and descending colon diverticulosis with associated diverticular spasm.  Diverticular associated erythema in sigmoid biopsied for exclusion of SCAD Pathology confirmed tubular adenomas (ascending, cecum), lamina propria fibrosis and reactive changes (diverticular associated erythema), lymphoid aggregate without dysplasia (transverse, rectal)  GI bleed consisting of hematochezia in August 2022 with admission to [facility] and then transferred to [facility] CTA abdomen negative for source of bleeding.  EGD performed with no source for bleeding, stomach biopsied and negative for H. pylori.  Tagged RBC scan with activity at proximal to mid small bowel.  Embolization not pursued.  Transfer to Hospital where he underwent EGD, push enteroscopy revealing mild bulbar duodenitis but no source for bleeding, no active or old blood present.  Capsule endoscopy revealed mild gastritis, mild duodenitis..  Colonoscopy 11/25/2020 revealing colonic diverticulosis without active bleeding, small, nonbleeding external hemorrhoids, a 5 to 9 mm polyp (path: hyperplastic) was removed from the sigmoid.  Transfused 9  PRBCs.  Experienced thrombocytopenia as low as 29 and received platelets.  ITP work-up negative, no hemolysis on peripheral smear.  Hgb 10.5 near discharge.   Discharge medications include PPI daily.  Low-dose aspirin was discontinued.. At the end of all the testing it was felt patient had experienced a diverticular bleed.  Doing well at GI office follow-up in early September 2022.  Hgb was 12.3.    Home meds do not include PPI/H2 blocker.  A few months ago patient complained to PCP of fatigue and asked about taking iron and his PCP said go ahead and take the iron.  Takes this intermittently and it causes dark stools when he takes it.  Avoids all aspirin and NSAID products. Patient plays doubles tennis for at least 2 hours 3 times weekly.  Has no activity limitations.  Has not had any interim bleeding per rectum.  Does not have upper GI/GERD symptoms. Good appetite.  Normally has brown stools every day except when he takes the iron.  He had taken iron day before yesterday and yesterday's bowel movement was darker in color but formed. At 11 PM patient went to the bathroom to urinate and then felt the need to defecate and passed red blood into the commode.  He had 2 episodes at home and 2 more once he got to the ED, the last was about 11:30 AM today, 1 hour ago.  While at home he experienced diaphoresis, presyncope and safely  got himself onto the floor from the toilet to avoid syncope.  Still experiencing orthostatic dizziness.    Normotensive, not tachy, no hypoxia, no fever  Hgb 11.9 >> 11.2.   MCV 76.  WBCs, platelets normal.  BUN elevated at 34. CTAP w angio ordered.    Lives with his wife in [place].    Past Medical History:  Diagnosis Date   Ankle fracture    Cancer ([place]) 1974   testicular cancer-at age 62   Cataract    bilateral sx   GERD (gastroesophageal reflux disease)    hx of   Hx of colonic polyps    pt unsure,thinks this was done in 1982 in [place]. no reports in EPIC    Hyperlipidemia    on meds   Hypertension    on meds   Kidney stones    Patella fracture    Retinal detachment    OD   Retinal tear of right eye    Syncopal episodes 2008    Past Surgical History:  Procedure Laterality Date   AIR/FLUID EXCHANGE Right 06/16/2014   Procedure: AIR/FLUID EXCHANGE;  Surgeon: Hayden Pedro, MD;  Location: [facility];  Service: Ophthalmology;  Laterality: Right;   CATARACT EXTRACTION     CATARACT EXTRACTION  09/2014   rt eye//left 01/2015   CATARACT EXTRACTION, BILATERAL  2016   per Dr. Herbert Deaner    COLONOSCOPY  02/16/2020   per Dr. Hilarie Fredrickson, adenomatous polyps, repeat in 3 yrs   EYE SURGERY     GAS INSERTION Right 06/16/2014   Procedure: INSERTION OF GAS;  Surgeon: Hayden Pedro, MD;  Location: [facility];  Service: Ophthalmology;  Laterality: Right;  C3F8   HYDROCELE EXCISION     KNEE ARTHROSCOPY Right    x 2   LASER PHOTO ABLATION Right 06/16/2014   Procedure: LASER PHOTO ABLATION;  Surgeon: Hayden Pedro, MD;  Location: [facility];  Service: Ophthalmology;  Laterality: Right;  Headscope laser and endolaser    ORCHIECTOMY     right testicle   REPAIR OF COMPLEX TRACTION RETINAL DETACHMENT Right 06/16/2014   Procedure: REPAIR OF COMPLEX TRACTION RETINAL DETACHMENT;  Surgeon: Hayden Pedro, MD;  Location: [facility];  Service: Ophthalmology;  Laterality: Right;   RETINAL DETACHMENT SURGERY  06/2014   TONSILLECTOMY     VITRECTOMY Right 06/16/2014   WISDOM TOOTH EXTRACTION      Prior to Admission medications   Medication Sig Start Date End Date Taking? Authorizing Provider  amLODipine (NORVASC) 5 MG tablet TAKE 1 TABLET BY MOUTH EVERY DAY Patient taking differently: Take 5 mg by mouth daily. 04/13/20  Yes Laurey Morale, MD  lisinopril-hydrochlorothiazide (ZESTORETIC) 20-12.5 MG tablet TAKE 1 TABLET BY MOUTH EVERY DAY Patient taking differently: Take 1 tablet by mouth daily. 04/13/20  Yes Laurey Morale, MD  rosuvastatin (CRESTOR) 40 MG tablet TAKE 1 TABLET BY MOUTH  EVERY DAY Patient taking differently: Take 40 mg by mouth daily. 01/28/21  Yes Jettie Booze, MD  Iron, Ferrous Sulfate, 325 (65 Fe) MG TABS Take 325 mg by mouth daily. Patient not taking: Reported on 03/21/2021 01/07/21   Laurey Morale, MD    Scheduled Meds:  Infusions:  lactated ringers 100 mL/hr at 04/01/21 0535   PRN Meds: acetaminophen **OR** acetaminophen, hydrALAZINE   Allergies as of 04/01/2021   (No Known Allergies)    Family History  Problem Relation Age of Onset   Parkinson's disease Mother    Arrhythmia Father  Coronary artery disease Other        fhx   Hypertension Other        fhx   Sudden death Other        fhx   Colon cancer Neg Hx    Colon polyps Neg Hx    Esophageal cancer Neg Hx    Rectal cancer Neg Hx    Stomach cancer Neg Hx     Social History   Socioeconomic History   Marital status: Married    Spouse name: Not on file   Number of children: Not on file   Years of education: Not on file   Highest education level: Not on file  Occupational History   Occupation: Solicitor: [employer]  Tobacco Use   Smoking status: Never   Smokeless tobacco: Never  Vaping Use   Vaping Use: Never used  Substance and Sexual Activity   Alcohol use: Yes    Alcohol/week: 0.0 standard drinks    Comment: 2 beers monthly   Drug use: No   Sexual activity: Not on file  Other Topics Concern   Not on file  Social History Narrative   Not on file   Social Determinants of Health   Financial Resource Strain: Low Risk    Difficulty of Paying Living Expenses: Not hard at all  Food Insecurity: No Food Insecurity   Worried About Charity fundraiser in the Last Year: Never true   [place] in the Last Year: Never true  Transportation Needs: No Transportation Needs   Lack of Transportation (Medical): No   Lack of Transportation (Non-Medical): No  Physical Activity: Sufficiently Active   Days of Exercise per Week: 3 days    Minutes of Exercise per Session: 120 min  Stress: No Stress Concern Present   Feeling of Stress : Not at all  Social Connections: Moderately Isolated   Frequency of Communication with Friends and Family: More than three times a week   Frequency of Social Gatherings with Friends and Family: More than three times a week   Attends Religious Services: Never   Marine scientist or Organizations: No   Attends Music therapist: Never   Marital Status: Married  Human resources officer Violence: Not At Risk   Fear of Current or Ex-Partner: No   Emotionally Abused: No   Physically Abused: No   Sexually Abused: No    REVIEW OF SYSTEMS: Constitutional: See HPI.  As long as he is resting he feels fine. ENT:  No nose bleeds Pulm: Denies shortness of breath and cough. CV:  No palpitations, no LE edema.  No angina GU:  No hematuria, no frequency GI: Per HPI. Heme: No other unusual bleeding or bruising other than this acute hematochezia. Transfusions: See HPI. Neuro: Dizziness, presyncope as per HPI. Derm:  No itching, no rash or sores.  Endocrine:  No sweats or chills.  No polyuria or dysuria Immunization: Reviewed. Travel:  None beyond local counties in last few months.    PHYSICAL EXAM: Vital signs in last 24 hours: Vitals:   04/01/21 0800 04/01/21 0830  BP: (!) 148/79 (!) 143/99  Pulse: 76 72  Resp: 20 (!) 26  Temp:    SpO2: 97% 97%   Wt Readings from Last 3 Encounters:  04/01/21 104.3 kg  12/01/20 104.3 kg  10/07/20 103.9 kg    General: Patient is well-appearing, comfortable, alert.  Provides excellent history. Head: No facial asymmetry or  swelling.  No signs of head trauma. Eyes: No conjunctival pallor. Ears: Not hard of hearing Nose: No congestion or discharge Mouth: Oropharynx moist, pink, clear.  Good dentition. Neck: No JVD, no thyromegaly Lungs: Clear bilaterally with good breath sounds.  No labored breathing Heart: RRR.  No MRG.  S1, S2 present Abdomen:  Soft without distention.  Not tender.  Active bowel sounds.  No HSM, masses, hernias, bruits.   Rectal: Deferred Musc/Skeltl: No joint redness, swelling or gross deformities. Extremities: Feet are warm.  No upper or lower extremity edema. Neurologic: Fully oriented.  Moves all 4 limbs with full strength.  No tremors. Skin: No rashes, telangiectasia, sores, suspicious lesions. Tattoos: None observed Nodes: No cervical adenopathy Psych: Cooperative, calm, pleasant, fluid speech.  Intake/Output from previous day: 12/22 0701 - 12/23 0700 In: 1000 [IV Piggyback:1000] Out: -  Intake/Output this shift: Total I/O In: -  Out: 800 [Urine:800]  LAB RESULTS: Recent Labs    04/01/21 0134 04/01/21 0536  WBC 7.5 6.3  HGB 11.6* 11.9*  HCT 36.8* 36.4*  PLT 155 153   BMET Lab Results  Component Value Date   NA 136 04/01/2021   NA 140 10/07/2020   NA 138 07/10/2019   K 4.2 04/01/2021   K 4.1 10/07/2020   K 4.3 07/10/2019   CL 107 04/01/2021   CL 104 10/07/2020   CL 105 07/10/2019   CO2 22 04/01/2021   CO2 27 10/07/2020   CO2 26 07/10/2019   GLUCOSE 109 (H) 04/01/2021   GLUCOSE 91 10/07/2020   GLUCOSE 94 07/10/2019   BUN 34 (H) 04/01/2021   BUN 26 (H) 10/07/2020   BUN 20 07/10/2019   CREATININE 1.03 04/01/2021   CREATININE 1.10 10/07/2020   CREATININE 1.02 07/10/2019   CALCIUM 8.2 (L) 04/01/2021   CALCIUM 9.6 10/07/2020   CALCIUM 9.1 07/10/2019   LFT Recent Labs    04/01/21 0134  PROT 5.0*  ALBUMIN 3.1*  AST 25  ALT 16  ALKPHOS 41  BILITOT 0.7   PT/INR No results found for: INR, PROTIME Hepatitis Panel No results for input(s): HEPBSAG, HCVAB, HEPAIGM, HEPBIGM in the last 72 hours. C-Diff No components found for: CDIFF Lipase  No results found for: LIPASE  Drugs of Abuse  No results found for: LABOPIA, COCAINSCRNUR, LABBENZ, AMPHETMU, THCU, LABBARB   RADIOLOGY STUDIES: No results found.    IMPRESSION:      Recurrent painless hematochezia.  Admission  for same this past August, 4 months ago.  After extensive testing diagnosis was diverticular bleed.  Microcytosis with mild anemia.  Hgb 12.3 three months ago, now down a couple of grams from that on recheck this morning.    No need for transfusion at present.   PLAN:     Awaiting results of CT angio, if positive proceed to angiography/embolization.  If negative begin clear liquids.  Check anemia studies to confirm if iron deficient in setting of microcytosis.  Will obtain these at next lab draw for CBC at 5 PM this evening.  CBC q. 5AM/5PM   Azucena Freed  04/01/2021, 10:09 AM Phone [phone]

## 2021-04-01 NOTE — Progress Notes (Signed)
OT Cancellation Note  Patient Details Name: WALLIE LAGRAND MRN: [id number] DOB: 07-16-1955   Cancelled Treatment:    Reason Eval/Treat Not Completed: Medical issues which prohibited therapy;Fatigue/lethargy limiting ability to participate.  Per PT, pt deferred OOB due to fear of passing out or passing more blood.  Will reattempt.  Nilsa Nutting., OTR/L Acute Rehabilitation Services Pager [phone] Office [phone]   Lucille Passy M 04/01/2021, 10:13 AM

## 2021-04-01 NOTE — Progress Notes (Signed)
PROGRESS NOTE                                                                                                                                                                                                             Patient Demographics:    [name] [name], is a 65 y.o. male, DOB - 08-Sep-1955, OIN:[id number]  Outpatient Primary MD for the patient is Laurey Morale, MD    LOS - 0  Admit date - 04/01/2021    Chief Complaint  Patient presents with   GI Problem    Blood in stool       Brief Narrative (HPI from H&P)  [name] [name] is a 65 y.o. male with history of hypertension, massive GI bleeding requiring 10 units of packed RBC transfusion at [facility] in August of this year with thorough work-up which was inconclusive but potential culprit was thought to be diverticular bleed, presents to the ER with complaints of having passed blood per rectum.  Patient states last night around 11:00 patient went to the bathroom and had a black tarry bowel movement following which patient felt faint and had to lay on the floor.  Then following which patient had another bowel movement which was frankly bloody.  Was large quantity.  He came to the ER with ongoing lower GI bleed, orthostatic hypotension and near syncope.  Was admitted for lower GI bleed   Subjective:    [name] [name] today has, No headache, No chest pain, No abdominal pain - No Nausea, No new weakness tingling or numbness, no SOB, ++ blood per rectum.   Assessment  & Plan :     Acute blood loss related anemia due to ongoing active lower GI bleed with orthostatic hypotension - he has had thorough work-up in August of this year at [facility] with potential culprit being diverticular but not completely clear.  We will closely monitor H&H, if hemoglobin is rapidly dropping or is below 8 with symptoms will transfuse packed RBC he has consented.  IV PPI, n.p.o., CTA stat.   GI has been consulted.  2.  Dyslipidemia.  Statin once taking oral diet.  3.  HTN.  As needed hydralazine.        Condition -  Guarded  Family Communication  :   Wife Rodena Piety [phone] on 04/01/2021  Code Status :  Full  Consults  :  GI  PUD Prophylaxis : PPI   Procedures  :     CTA -       Disposition Plan  :    Status is: Observation  DVT Prophylaxis  :    Place TED hose Start: 04/01/21 0918 SCDs Start: 04/01/21 0501   Lab Results  Component Value Date   PLT 153 04/01/2021    Diet :  Diet Order             Diet NPO time specified  Diet effective now                    Inpatient Medications  Scheduled Meds: Continuous Infusions:  lactated ringers 100 mL/hr at 04/01/21 0535   PRN Meds:.acetaminophen **OR** acetaminophen, hydrALAZINE  Antibiotics  :    Anti-infectives (From admission, onward)    None        Time Spent in minutes  30   Lala Lund M.D on 04/01/2021 at 9:18 AM  To page go to [email or web address]   [facility] -  Office  [phone]  See all Orders from today for further details    Objective:   Vitals:   04/01/21 0700 04/01/21 0715 04/01/21 0800 04/01/21 0830  BP: 131/72 (!) 144/86 (!) 148/79 (!) 143/99  Pulse: 62 64 76 72  Resp: 13 15 20  (!) 26  Temp:      TempSrc:      SpO2: 99% 93% 97% 97%  Weight:      Height:        Wt Readings from Last 3 Encounters:  04/01/21 104.3 kg  12/01/20 104.3 kg  10/07/20 103.9 kg     Intake/Output Summary (Last 24 hours) at 04/01/2021 0918 Last data filed at 04/01/2021 0824 Gross per 24 hour  Intake 1000 ml  Output 800 ml  Net 200 ml     Physical Exam  Awake Alert, No new F.N deficits, Normal affect [place].AT,PERRAL Supple Neck, No JVD,   Symmetrical Chest wall movement, Good air movement bilaterally, CTAB RRR,No Gallops,Rubs or new Murmurs,  +ve B.Sounds, Abd Soft, No tenderness,   No Cyanosis, Clubbing or edema       Data Review:     CBC Recent Labs  Lab 04/01/21 0134 04/01/21 0536  WBC 7.5 6.3  HGB 11.6* 11.9*  HCT 36.8* 36.4*  PLT 155 153  MCV 79.0* 76.6*  MCH 24.9* 25.1*  MCHC 31.5 32.7  RDW 17.3* 17.2*  LYMPHSABS 0.7  --   MONOABS 0.6  --   EOSABS 0.1  --   BASOSABS 0.1  --     Electrolytes Recent Labs  Lab 04/01/21 0134 04/01/21 0140  NA 136  --   K 4.2  --   CL 107  --   CO2 22  --   GLUCOSE 109*  --   BUN 34*  --   CREATININE 1.03  --   CALCIUM 8.2*  --   AST 25  --   ALT 16  --   ALKPHOS 41  --   BILITOT 0.7  --   ALBUMIN 3.1*  --   LATICACIDVEN  --  1.1    ------------------------------------------------------------------------------------------------------------------ No results for input(s): CHOL, HDL, LDLCALC, TRIG, CHOLHDL, LDLDIRECT in the last 72 hours.  Lab Results  Component Value Date   HGBA1C 5.3 10/07/2020    No results for input(s): TSH, T4TOTAL, T3FREE, THYROIDAB in the last 72 hours.  Invalid input(s): FREET3 ------------------------------------------------------------------------------------------------------------------ ID Labs Recent Labs  Lab 04/01/21 0134 04/01/21 0140 04/01/21 0536  WBC 7.5  --  6.3  PLT 155  --  153  LATICACIDVEN  --  1.1  --   CREATININE 1.03  --   --    Cardiac Enzymes No results for input(s): CKMB, TROPONINI, MYOGLOBIN in the last 168 hours.  Invalid input(s): CK   Radiology Reports No results found.

## 2021-04-01 NOTE — ED Triage Notes (Signed)
Pt arrives to ED by [facility]. Pt woke from sleep to have BM. Had episode of diaphoresis and sat down to avoid syncope- looses control of bowels upon sitting - black tarry stool. Pt proceeds to make it to restroom and has second BM - bright red. No LOC. No CP, NO SOB, No N/V.  HX GI bleed 6 mo ago assoc w/ diverticulitis. Vitals WNL with EMS and 1L bolus NS started.

## 2021-04-01 NOTE — Progress Notes (Signed)
Pt has episode x1 of black tarry stools. Pt remains alert/oriented in no apparent distress. Denies any pain/discomfort. No complaints. Attending MD made aware and ordered a cbc.

## 2021-04-01 NOTE — H&P (Signed)
History and Physical    [name] [name] ZHG:[id number] DOB: 10-09-1955 DOA: 04/01/2021  PCP: [name] Morale, MD  Patient coming from: Home.  Chief Complaint: Rectal bleeding.  HPI: [name] [name] is a 65 y.o. male with history of hypertension presents to the ER with complaints of having passed blood per rectum.  Patient states last night around 11:00 patient went to the bathroom and had a black tarry bowel movement following which patient felt faint and had to lay on the floor.  Then following which patient had another bowel movement which was frankly bloody.  Was large quantity.  Had some abdominal discomfort but no vomiting.  Patient was brought to the ER.  In August of this year patient was admitted to [facility] when patient had similar complaints at that time patient required 9 units of blood transfusion.  Patient during that admission had underwent EGD colonoscopy enteroscopy and capsule endoscopy.  Gastroenterologist Dr. Derrek [name] felt the source was likely diverticular.  Results of the tests are attached below.  EGD 11/12/2020 Source of bleeding is not found Incidental finding of bulbar erosions  Path:  GASTRIC MUCOSA SHOWING NO SIGNIFICANT HISTOPATHOLOGICAL CHANGES. THE FEATURES OF HELICOBACTER PYLORI ASSOCIATED GASTRITIS ARE NOT IDENTIFIED.  Enteroscopy 11/15/2020 Mild duodenitis in the bulb.  Otherwise, no evidence of active or old bleeding in the visualized portions of visualized portions of the small bowel.   Capsule 11/16/2020 1) Mild gastritis 2) Mild duodenitis 3) No source of small bowel bleeding  Colonoscopy 11/19/2020 5-9 mm sigmoid colon polyp s/p cold snare polypectomy.  Moderate sigmoid colon diverticulosis.  Cecum/proximal ascending colon lipoma.  No evidence of old blood or active bleeding up in to distal terminal ileum.   Path: COLON, SIGMOID POLYP, BIOPSY: Hyperplastic polyp.  ED Course: In the ER patient was hemodynamically stable.  Had another  episode of frank rectal bleeding.  Hemoglobin is 11.6 lactic acid 1.1.  COVID test negative.  Patient admitted for further management of acute GI bleed.  Review of Systems: As per HPI, rest all negative.   Past Medical History:  Diagnosis Date   Ankle fracture    Cancer ([place]) 1974   testicular cancer-at age 13   Cataract    bilateral sx   GERD (gastroesophageal reflux disease)    hx of   Hx of colonic polyps    pt unsure,thinks this was done in 1982 in [place]. no reports in EPIC   Hyperlipidemia    on meds   Hypertension    on meds   Kidney stones    Patella fracture    Retinal detachment    OD   Retinal tear of right eye    Syncopal episodes 2008    Past Surgical History:  Procedure Laterality Date   AIR/FLUID EXCHANGE Right 06/16/2014   Procedure: AIR/FLUID EXCHANGE;  Surgeon: [name] Pedro, MD;  Location: [place];  Service: Ophthalmology;  Laterality: Right;   CATARACT EXTRACTION     CATARACT EXTRACTION  09/2014   rt eye//left 01/2015   CATARACT EXTRACTION, BILATERAL  2016   per Dr. Herbert [name]    COLONOSCOPY  02/16/2020   per Dr. Hilarie [name], adenomatous polyps, repeat in 3 yrs   EYE SURGERY     GAS INSERTION Right 06/16/2014   Procedure: INSERTION OF GAS;  Surgeon: [name] Pedro, MD;  Location: [place];  Service: Ophthalmology;  Laterality: Right;  C3F8   HYDROCELE EXCISION     KNEE ARTHROSCOPY Right    x 2  LASER PHOTO ABLATION Right 06/16/2014   Procedure: LASER PHOTO ABLATION;  Surgeon: [name] Pedro, MD;  Location: [place];  Service: Ophthalmology;  Laterality: Right;  Headscope laser and endolaser    ORCHIECTOMY     right testicle   REPAIR OF COMPLEX TRACTION RETINAL DETACHMENT Right 06/16/2014   Procedure: REPAIR OF COMPLEX TRACTION RETINAL DETACHMENT;  Surgeon: [name] Pedro, MD;  Location: [place];  Service: Ophthalmology;  Laterality: Right;   RETINAL DETACHMENT SURGERY  06/2014   TONSILLECTOMY     VITRECTOMY Right 06/16/2014   WISDOM TOOTH EXTRACTION        reports that he has never smoked. He has never used smokeless tobacco. He reports current alcohol use. He reports that he does not use drugs.  No Known Allergies  Family History  Problem Relation Age of Onset   Parkinson's disease Mother    Arrhythmia Father    Coronary artery disease Other        fhx   Hypertension Other        fhx   Sudden death Other        fhx   Colon cancer Neg Hx    Colon polyps Neg Hx    Esophageal cancer Neg Hx    Rectal cancer Neg Hx    Stomach cancer Neg Hx     Prior to Admission medications   Medication Sig Start Date End Date Taking? Authorizing Provider  amLODipine (NORVASC) 5 MG tablet TAKE 1 TABLET BY MOUTH EVERY DAY Patient taking differently: Take 5 mg by mouth daily. 04/13/20  Yes [name] Morale, MD  lisinopril-hydrochlorothiazide (ZESTORETIC) 20-12.5 MG tablet TAKE 1 TABLET BY MOUTH EVERY DAY Patient taking differently: Take 1 tablet by mouth daily. 04/13/20  Yes [name] Morale, MD  rosuvastatin (CRESTOR) 40 MG tablet TAKE 1 TABLET BY MOUTH EVERY DAY Patient taking differently: Take 40 mg by mouth daily. 01/28/21  Yes [name] Booze, MD  Iron, Ferrous Sulfate, 325 (65 Fe) MG TABS Take 325 mg by mouth daily. Patient not taking: Reported on 03/21/2021 01/07/21   [name] Morale, MD    Physical Exam: Constitutional: Moderately built and nourished. Vitals:   04/01/21 0052 04/01/21 0150 04/01/21 0225 04/01/21 0400  BP:   (!) 152/72 (!) 156/76  Pulse:   (!) 54 66  Resp:   16 16  Temp:      TempSrc:      SpO2: 98% 100% 100% 97%  Weight:      Height:       Eyes: Anicteric no pallor. ENMT: No discharge from the ears eyes nose and mouth. Neck: No mass felt.  No neck rigidity. Respiratory: No locking or crepitations. Cardiovascular: S1-S2 heard. Abdomen: Soft nontender bowel sound present. Musculoskeletal: No edema. Skin: No rash. Neurologic: Alert awake oriented to time place and person.  Moves all extremities. Psychiatric: Appears  normal.  Normal affect.   Labs on Admission: I have personally reviewed following labs and imaging studies  CBC: Recent Labs  Lab 04/01/21 0134  WBC 7.5  NEUTROABS 6.0  HGB 11.6*  HCT 36.8*  MCV 79.0*  PLT 412   Basic Metabolic Panel: Recent Labs  Lab 04/01/21 0134  NA 136  K 4.2  CL 107  CO2 22  GLUCOSE 109*  BUN 34*  CREATININE 1.03  CALCIUM 8.2*   GFR: Estimated Creatinine Clearance: 86.7 mL/min (by C-G formula based on SCr of 1.03 mg/dL). Liver Function Tests: Recent Labs  Lab 04/01/21 0134  AST 25  ALT 16  ALKPHOS 41  BILITOT 0.7  PROT 5.0*  ALBUMIN 3.1*   No results for input(s): LIPASE, AMYLASE in the last 168 hours. No results for input(s): AMMONIA in the last 168 hours. Coagulation Profile: No results for input(s): INR, PROTIME in the last 168 hours. Cardiac Enzymes: No results for input(s): CKTOTAL, CKMB, CKMBINDEX, TROPONINI in the last 168 hours. BNP (last 3 results) No results for input(s): PROBNP in the last 8760 hours. HbA1C: No results for input(s): HGBA1C in the last 72 hours. CBG: No results for input(s): GLUCAP in the last 168 hours. Lipid Profile: No results for input(s): CHOL, HDL, LDLCALC, TRIG, CHOLHDL, LDLDIRECT in the last 72 hours. Thyroid Function Tests: No results for input(s): TSH, T4TOTAL, FREET4, T3FREE, THYROIDAB in the last 72 hours. Anemia Panel: No results for input(s): VITAMINB12, FOLATE, FERRITIN, TIBC, IRON, RETICCTPCT in the last 72 hours. Urine analysis:    Component Value Date/Time   COLORURINE STRAW (A) 04/01/2021 0300   APPEARANCEUR CLEAR 04/01/2021 0300   LABSPEC 1.014 04/01/2021 0300   PHURINE 5.0 04/01/2021 0300   GLUCOSEU NEGATIVE 04/01/2021 0300   HGBUR SMALL (A) 04/01/2021 0300   HGBUR negative 09/30/2009 0804   BILIRUBINUR NEGATIVE 04/01/2021 0300   BILIRUBINUR n 02/12/2015 0949   KETONESUR NEGATIVE 04/01/2021 0300   PROTEINUR NEGATIVE 04/01/2021 0300   UROBILINOGEN 0.2 02/12/2015 0949    UROBILINOGEN 0.2 09/30/2009 0804   NITRITE NEGATIVE 04/01/2021 0300   LEUKOCYTESUR NEGATIVE 04/01/2021 0300   Sepsis Labs: @LABRCNTIP (procalcitonin:4,lacticidven:4) ) Recent Results (from the past 240 hour(s))  Resp Panel by RT-PCR (Flu A&B, Covid) Nasopharyngeal Swab     Status: None   Collection Time: 04/01/21  1:24 AM   Specimen: Nasopharyngeal Swab; Nasopharyngeal(NP) swabs in vial transport medium  Result Value Ref Range Status   SARS Coronavirus 2 by RT PCR NEGATIVE NEGATIVE Final    Comment: (NOTE) SARS-CoV-2 target nucleic acids are NOT DETECTED.  The SARS-CoV-2 RNA is generally detectable in upper respiratory specimens during the acute phase of infection. The lowest concentration of SARS-CoV-2 viral copies this assay can detect is 138 copies/mL. A negative result does not preclude SARS-Cov-2 infection and should not be used as the sole basis for treatment or other patient management decisions. A negative result may occur with  improper specimen collection/handling, submission of specimen other than nasopharyngeal swab, presence of viral mutation(s) within the areas targeted by this assay, and inadequate number of viral copies(<138 copies/mL). A negative result must be combined with clinical observations, patient history, and epidemiological information. The expected result is Negative.  Fact Sheet for Patients:  EntrepreneurPulse.com.au  Fact Sheet for Healthcare Providers:  IncredibleEmployment.be  This test is no t yet approved or cleared by the [place] FDA and  has been authorized for detection and/or diagnosis of SARS-CoV-2 by FDA under an Emergency Use Authorization (EUA). This EUA will remain  in effect (meaning this test can be used) for the duration of the COVID-19 declaration under Section 564(b)(1) of the Act, 21 U.S.C.section 360bbb-3(b)(1), unless the authorization is terminated  or revoked sooner.        Influenza A by PCR NEGATIVE NEGATIVE Final   Influenza B by PCR NEGATIVE NEGATIVE Final    Comment: (NOTE) The Xpert Xpress SARS-CoV-2/FLU/RSV plus assay is intended as an aid in the diagnosis of influenza from Nasopharyngeal swab specimens and should not be used as a sole basis for treatment. Nasal washings and aspirates are unacceptable for Xpert Xpress SARS-CoV-2/FLU/RSV testing.  Fact Sheet for Patients: EntrepreneurPulse.com.au  Fact Sheet for Healthcare Providers: IncredibleEmployment.be  This test is not yet approved or cleared by the [place] FDA and has been authorized for detection and/or diagnosis of SARS-CoV-2 by FDA under an Emergency Use Authorization (EUA). This EUA will remain in effect (meaning this test can be used) for the duration of the COVID-19 declaration under Section 564(b)(1) of the Act, 21 U.S.C. section 360bbb-3(b)(1), unless the authorization is terminated or revoked.  Performed at [facility], [place] [address], [place], [place] [address]      Radiological Exams on Admission: No results found.    Assessment/Plan Principal Problem:   Acute GI bleeding Active Problems:   Essential hypertension   Acute blood loss anemia    Acute GI bleeding -patient had a similar episode in August of this year when patient was admitted to [facility] at that time gastroenterologist Dr. Derrek [name] felt the GI source was likely diverticular bleed.  Patient at that time underwent EGD colonoscopy enteroscopy and capsule endoscopy.  We will keep patient n.p.o. consult Dr. Luis Abed gastroenterologist with whom patient had followed up earlier.  Check serial CBC transfuse if patient becomes hypotensive or has further episodes of bleeding or if hemoglobin less than 7.  Protonix IV. Acute blood loss anemia follow CBC.  Last hemoglobin in September 2022 was around 12.  Presently it is around 11. Hypertension we  will hold antihypertensives since patient is n.p.o.  We will keep patient on as needed IV hydralazine for systolic more than 037.   DVT prophylaxis: SCDs. Code Status: Full code. Family Communication: Discussed with patient. Disposition Plan: Home. Consults called: We will consult GI. Admission status: Observation.   Rise Patience MD [facility] Pager [phone].  If 7PM-7AM, please contact night-coverage [email or web address] Password [facility]  04/01/2021, 5:04 AM

## 2021-04-01 NOTE — Progress Notes (Signed)
PM MD notified pts HGB being a 10. Per AM MD results were given. No new orders placed.

## 2021-04-01 NOTE — ED Provider Notes (Signed)
[facility] EMERGENCY DEPARTMENT Provider Note   CSN: [id number] Arrival date & time: 04/01/21  0047     History Chief Complaint  Patient presents with   GI Problem    Blood in stool    [name] [name] is a 65 y.o. male.  The history is provided by the patient.  GI Problem He has history of hypertension, hyperlipidemia, gastrointestinal bleeding and comes in because of rectal bleeding tonight.  He woke up to urinate and then had a sense that he needed to have a bowel movement.  He started getting lightheaded and sweaty and lay down.  He then had a bowel movement which was black and tarry.  He did have another bowel movement following that which was bright red.  He denies any abdominal pain.  There was mild nausea but no vomiting.  This is similar to an episode he had 4 months ago for which he was hospitalized at [facility]  He is not on any anticoagulants and does not take any antiplatelet agents.   Past Medical History:  Diagnosis Date   Ankle fracture    Cancer ([place]) 1974   testicular cancer-at age 46   Cataract    bilateral sx   GERD (gastroesophageal reflux disease)    hx of   Hx of colonic polyps    pt unsure,thinks this was done in 1982 in [place]. no reports in EPIC   Hyperlipidemia    on meds   Hypertension    on meds   Kidney stones    Patella fracture    Retinal detachment    OD   Retinal tear of right eye    Syncopal episodes 2008    Patient Active Problem List   Diagnosis Date Noted   Gastrointestinal hemorrhage associated with intestinal diverticulosis 12/01/2020   Right hip impingement syndrome 08/18/2019   Aortic atherosclerosis ([place]) 07/10/2019   Elevated hemoglobin ([place]) 09/13/2017   Rhegmatogenous retinal detachment of right eye 06/16/2014   Hyperlipidemia 10/01/2007   Essential hypertension 10/01/2007   NEOPLASM, MALIGNANT, TESTES, HX OF 10/01/2007   COLONIC POLYPS, HX OF 10/01/2007   NEPHROLITHIASIS, HX OF 10/01/2007     Past Surgical History:  Procedure Laterality Date   AIR/FLUID EXCHANGE Right 06/16/2014   Procedure: AIR/FLUID EXCHANGE;  Surgeon: Hayden Pedro, MD;  Location: [place];  Service: Ophthalmology;  Laterality: Right;   CATARACT EXTRACTION     CATARACT EXTRACTION  09/2014   rt eye//left 01/2015   CATARACT EXTRACTION, BILATERAL  2016   per Dr. Herbert Deaner    COLONOSCOPY  02/16/2020   per Dr. Hilarie Fredrickson, adenomatous polyps, repeat in 3 yrs   EYE SURGERY     GAS INSERTION Right 06/16/2014   Procedure: INSERTION OF GAS;  Surgeon: Hayden Pedro, MD;  Location: [place];  Service: Ophthalmology;  Laterality: Right;  C3F8   HYDROCELE EXCISION     KNEE ARTHROSCOPY Right    x 2   LASER PHOTO ABLATION Right 06/16/2014   Procedure: LASER PHOTO ABLATION;  Surgeon: Hayden Pedro, MD;  Location: [place];  Service: Ophthalmology;  Laterality: Right;  Headscope laser and endolaser    ORCHIECTOMY     right testicle   REPAIR OF COMPLEX TRACTION RETINAL DETACHMENT Right 06/16/2014   Procedure: REPAIR OF COMPLEX TRACTION RETINAL DETACHMENT;  Surgeon: Hayden Pedro, MD;  Location: [place];  Service: Ophthalmology;  Laterality: Right;   RETINAL DETACHMENT SURGERY  06/2014   TONSILLECTOMY  VITRECTOMY Right 06/16/2014   WISDOM TOOTH EXTRACTION         Family History  Problem Relation Age of Onset   Parkinson's disease Mother    Arrhythmia Father    Coronary artery disease Other        fhx   Hypertension Other        fhx   Sudden death Other        fhx   Colon cancer Neg Hx    Colon polyps Neg Hx    Esophageal cancer Neg Hx    Rectal cancer Neg Hx    Stomach cancer Neg Hx     Social History   Tobacco Use   Smoking status: Never   Smokeless tobacco: Never  Vaping Use   Vaping Use: Never used  Substance Use Topics   Alcohol use: Yes    Alcohol/week: 0.0 standard drinks    Comment: 2 beers monthly   Drug use: No    Home Medications Prior to Admission medications   Medication Sig  Start Date End Date Taking? Authorizing Provider  amLODipine (NORVASC) 5 MG tablet TAKE 1 TABLET BY MOUTH EVERY DAY 04/13/20   Laurey Morale, MD  Iron, Ferrous Sulfate, 325 (65 Fe) MG TABS Take 325 mg by mouth daily. Patient not taking: Reported on 03/21/2021 01/07/21   Laurey Morale, MD  lisinopril-hydrochlorothiazide (ZESTORETIC) 20-12.5 MG tablet TAKE 1 TABLET BY MOUTH EVERY DAY 04/13/20   Laurey Morale, MD  pantoprazole (PROTONIX) 40 MG tablet Take 40 mg by mouth daily.    [provider]  rosuvastatin (CRESTOR) 40 MG tablet TAKE 1 TABLET BY MOUTH EVERY DAY 01/28/21   Jettie Booze, MD    Allergies    Patient has no known allergies.  Review of Systems   Review of Systems  All other systems reviewed and are negative.  Physical Exam Updated Vital Signs BP (!) 150/78 (BP Location: Right Arm)    Pulse 71    Temp 97.8 F (36.6 C) (Oral)    Resp 18    Ht 5\' 11"  (1.803 m)    Wt 104.3 kg    SpO2 98%    BMI 32.08 kg/m   Physical Exam Vitals and nursing note reviewed.  65 year old male, resting comfortably and in no acute distress. Vital signs are significant for elevated blood pressure. Oxygen saturation is 98%, which is normal. Head is normocephalic and atraumatic. PERRLA, EOMI. Oropharynx is clear. Neck is nontender and supple without adenopathy or JVD. Back is nontender and there is no CVA tenderness. Lungs are clear without rales, wheezes, or rhonchi. Chest is nontender. Heart has regular rate and rhythm without murmur. Abdomen is soft, flat, nontender without masses or hepatosplenomegaly and peristalsis is normoactive. Extremities have no cyanosis or edema, full range of motion is present. Skin is warm and dry without rash. Neurologic: Mental status is normal, cranial nerves are intact, there are no motor or sensory deficits.  ED Results / Procedures / Treatments   Labs (all labs ordered are listed, but only abnormal results are displayed) Labs Reviewed  CBC WITH  DIFFERENTIAL/PLATELET - Abnormal; Notable for the following components:      Result Value   Hemoglobin 11.6 (*)    HCT 36.8 (*)    MCV 79.0 (*)    MCH 24.9 (*)    RDW 17.3 (*)    All other components within normal limits  COMPREHENSIVE METABOLIC PANEL - Abnormal; Notable for the following components:  Glucose, Bld 109 (*)    BUN 34 (*)    Calcium 8.2 (*)    Total Protein 5.0 (*)    Albumin 3.1 (*)    All other components within normal limits  URINALYSIS, ROUTINE W REFLEX MICROSCOPIC - Abnormal; Notable for the following components:   Color, Urine STRAW (*)    Hgb urine dipstick SMALL (*)    All other components within normal limits  POC OCCULT BLOOD, ED - Abnormal; Notable for the following components:   Fecal Occult Bld POSITIVE (*)    All other components within normal limits  RESP PANEL BY RT-PCR (FLU A&B, COVID) ARPGX2  LACTIC ACID, PLASMA  TYPE AND SCREEN  PREPARE RBC (CROSSMATCH)  ABO/RH   Procedures Procedures   Medications Ordered in ED Medications  0.9 %  sodium chloride infusion (has no administration in time range)  lactated ringers bolus 1,000 mL (0 mLs Intravenous Stopped 04/01/21 0327)    ED Course  I have reviewed the triage vital signs and the nursing notes.  Pertinent lab results that were available during my care of the patient were reviewed by me and considered in my medical decision making (see chart for details).    MDM Rules/Calculators/A&P                         Rectal bleeding.  Possible sources include diverticular bleed, angiodysplasia, peptic ulcer.  Old records are reviewed confirming hospitalization for GI bleeding in August of this year.  Prior colonoscopy had shown several colonic polyps which were removed, and also presence of multiple diverticuli.  Hemoglobin has dropped compared with most recent hemoglobin, but he continues to be hemodynamically stable.  He did have another bloody bowel movement while in the emergency department.   Case is discussed with Dr. Hal Hope of [facility], who agrees to admit the patient.  Final Clinical Impression(s) / ED Diagnoses Final diagnoses:  Gastrointestinal hemorrhage, unspecified gastrointestinal hemorrhage type  Anemia associated with acute blood loss    Rx / DC Orders ED Discharge Orders     None        Delora Fuel, MD 57/01/77 [phone]

## 2021-04-01 NOTE — Progress Notes (Signed)
PT Cancellation Note  Patient Details Name: [name] [name] MRN: [id number] DOB: 18-Nov-1955   Cancelled Treatment:    Reason Eval/Treat Not Completed: Patient declined, no reason specified; patient reports not able to tolerate upright mobility today due to feeling "vagal" and increased bowel activity with movement.  Discussed importance of mobility to improve body's ability to replenish blood supply.  Continue to request to just lay flat for today.  Encouraged at least [facility] up as tolerated.  PT will follow up another day.   Reginia Naas 04/01/2021, 10:36 AM Magda Kiel, PT Acute Rehabilitation Services BAQVO:[phone] Office:[phone] 04/01/2021

## 2021-04-02 ENCOUNTER — Encounter (HOSPITAL_COMMUNITY)
Admission: EM | Disposition: A | Discharge status: Other Acute Inpatient Hospital | Payer: Self-pay | Source: Home / Self Care | Attending: Internal Medicine

## 2021-04-02 ENCOUNTER — Inpatient Hospital Stay (HOSPITAL_COMMUNITY): Payer: Medicare Other

## 2021-04-02 DIAGNOSIS — K922 Gastrointestinal hemorrhage, unspecified: Secondary | ICD-10-CM | POA: Diagnosis not present

## 2021-04-02 DIAGNOSIS — K639 Disease of intestine, unspecified: Secondary | ICD-10-CM

## 2021-04-02 HISTORY — PX: GIVENS CAPSULE STUDY: SHX5432

## 2021-04-02 LAB — COMPREHENSIVE METABOLIC PANEL
ALT: 13 U/L (ref 0–44)
AST: 15 U/L (ref 15–41)
Albumin: 2.8 g/dL — ABNORMAL LOW (ref 3.5–5.0)
Alkaline Phosphatase: 37 U/L — ABNORMAL LOW (ref 38–126)
Anion gap: 6 (ref 5–15)
BUN: 23 mg/dL (ref 8–23)
CO2: 23 mmol/L (ref 22–32)
Calcium: 8.1 mg/dL — ABNORMAL LOW (ref 8.9–10.3)
Chloride: 107 mmol/L (ref 98–111)
Creatinine, Ser: 1.11 mg/dL (ref 0.61–1.24)
GFR, Estimated: >60 mL/min (ref >60)
Glucose, Bld: 146 mg/dL — ABNORMAL HIGH (ref 70–99)
Potassium: 4.1 mmol/L (ref 3.5–5.1)
Sodium: 136 mmol/L (ref 135–145)
Total Bilirubin: 0.8 mg/dL (ref 0.3–1.2)
Total Protein: 4.7 g/dL — ABNORMAL LOW (ref 6.5–8.1)

## 2021-04-02 LAB — CBC
HCT: 27.8 % — ABNORMAL LOW (ref 39.0–52.0)
HCT: 26.9 % — ABNORMAL LOW (ref 39.0–52.0)
HCT: 23.5 % — ABNORMAL LOW (ref 39.0–52.0)
Hemoglobin: 9.3 g/dL — ABNORMAL LOW (ref 13.0–17.0)
Hemoglobin: 9 g/dL — ABNORMAL LOW (ref 13.0–17.0)
Hemoglobin: 7.6 g/dL — ABNORMAL LOW (ref 13.0–17.0)
MCH: 25.5 pg — ABNORMAL LOW (ref 26.0–34.0)
MCH: 25.8 pg — ABNORMAL LOW (ref 26.0–34.0)
MCH: 25.2 pg — ABNORMAL LOW (ref 26.0–34.0)
MCHC: 33.5 g/dL (ref 30.0–36.0)
MCHC: 33.5 g/dL (ref 30.0–36.0)
MCHC: 32.3 g/dL (ref 30.0–36.0)
MCV: 76.4 fL — ABNORMAL LOW (ref 80.0–100.0)
MCV: 77.1 fL — ABNORMAL LOW (ref 80.0–100.0)
MCV: 77.8 fL — ABNORMAL LOW (ref 80.0–100.0)
Platelets: 146 10*3/uL — ABNORMAL LOW (ref 150–400)
Platelets: 167 10*3/uL (ref 150–400)
Platelets: 157 10*3/uL (ref 150–400)
RBC: 3.64 MIL/uL — ABNORMAL LOW (ref 4.22–5.81)
RBC: 3.49 MIL/uL — ABNORMAL LOW (ref 4.22–5.81)
RBC: 3.02 MIL/uL — ABNORMAL LOW (ref 4.22–5.81)
RDW: 17.4 % — ABNORMAL HIGH (ref 11.5–15.5)
RDW: 17.6 % — ABNORMAL HIGH (ref 11.5–15.5)
RDW: 17.8 % — ABNORMAL HIGH (ref 11.5–15.5)
WBC: 5.5 10*3/uL (ref 4.0–10.5)
WBC: 6.5 10*3/uL (ref 4.0–10.5)
WBC: 8 10*3/uL (ref 4.0–10.5)
nRBC: 0 % (ref 0.0–0.2)
nRBC: 0 % (ref 0.0–0.2)
nRBC: 0 % (ref 0.0–0.2)

## 2021-04-02 LAB — BRAIN NATRIURETIC PEPTIDE: B Natriuretic Peptide: 7.9 pg/mL (ref 0.0–100.0)

## 2021-04-02 LAB — PREPARE RBC (CROSSMATCH)

## 2021-04-02 LAB — GLUCOSE, CAPILLARY
Glucose-Capillary: 192 mg/dL — ABNORMAL HIGH (ref 70–99)
Glucose-Capillary: 111 mg/dL — ABNORMAL HIGH (ref 70–99)
Glucose-Capillary: 131 mg/dL — ABNORMAL HIGH (ref 70–99)

## 2021-04-02 LAB — MAGNESIUM: Magnesium: 1.8 mg/dL (ref 1.7–2.4)

## 2021-04-02 SURGERY — IMAGING PROCEDURE, GI TRACT, INTRALUMINAL, VIA CAPSULE

## 2021-04-02 MED ORDER — LACTATED RINGERS IV SOLN: INTRAVENOUS | Status: AC

## 2021-04-02 MED ORDER — TECHNETIUM TC 99M-LABELED RED BLOOD CELLS IV KIT
24.8000 | PACK | Freq: Once | INTRAVENOUS | Status: AC | PRN
  Administered 2021-04-02: 13:00:00 24.8 via INTRAVENOUS

## 2021-04-02 MED ORDER — SODIUM CHLORIDE 0.9% IV SOLUTION: Freq: Once | INTRAVENOUS | Status: AC

## 2021-04-02 NOTE — Progress Notes (Signed)
PT Cancellation Note  Patient Details Name: SAMUELL KNOBLE MRN: [id number] DOB: 07/21/1955   Cancelled Treatment:    Reason Eval/Treat Not Completed: Pt reports, "I'm not getting up until all procedures done and bleeding under control..." despite education and encouragement from therapy. Will follow-up for PT Evaluation on Monday if pt allows.  Mabeline Caras, PT, DPT Acute Rehabilitation Services  Pager [phone] Office [place] 04/02/2021, 10:08 AM

## 2021-04-02 NOTE — Progress Notes (Signed)
OT Cancellation Note  Patient Details Name: KYVON HU MRN: [id number] DOB: 12-20-1955   Cancelled Treatment:    Reason Eval/Treat Not Completed: Medical issues which prohibited therapy. Pt reports that he knows he will "vagle" if he gets up due to his GI bleed. He reports that he will not be getting out of bed until all procedures are done and bleeding is under control. OT will follow up as time allows.   Emile Kyllo H., OTR/L Acute Rehabilitation  Margurete Guaman Elane Yolanda Bonine 04/02/2021, 10:10 AM

## 2021-04-02 NOTE — Progress Notes (Signed)
CBC available for review in Epic. [place], NP notified.

## 2021-04-02 NOTE — Progress Notes (Signed)
Patient swallowed capsule camera at 1620. Instructions given to patient and beside nurse on diet orders. RN from endo will pick up capsule camera in the AM.

## 2021-04-02 NOTE — Progress Notes (Signed)
PROGRESS NOTE                                                                                                                                                                                                             Patient Demographics:    [name] [name], is a 65 y.o. male, DOB - 28-Nov-1955, KGY:[id number]  Outpatient Primary MD for the patient is Laurey Morale, MD    LOS - 1  Admit date - 04/01/2021    Chief Complaint  Patient presents with   GI Problem    Blood in stool       Brief Narrative (HPI from H&P)  [name] [name] is a 65 y.o. male with history of hypertension, massive GI bleeding requiring 10 units of packed RBC transfusion at [facility] in August of this year with thorough work-up which was inconclusive but potential culprit was thought to be diverticular bleed, presents to the ER with complaints of having passed blood per rectum.  Patient states last night around 11:00 patient went to the bathroom and had a black tarry bowel movement following which patient felt faint and had to lay on the floor.  Then following which patient had another bowel movement which was frankly bloody.  Was large quantity.  He came to the ER with ongoing lower GI bleed, orthostatic hypotension and near syncope.  Was admitted for lower GI bleed   Subjective:   Patient in bed, appears comfortable, denies any headache, no fever, no chest pain or pressure, no shortness of breath , no abdominal pain. No new focal weakness.  Still having some bloody bowel movements intermittently.   Assessment  & Plan :     Acute blood loss related anemia due to ongoing active lower GI bleed with orthostatic hypotension - he has had thorough work-up in August of this year at [facility] with potential culprit being diverticular but not completely clear.  On board, closely monitoring H&H which is now downtrending, since he has symptoms and is  orthostatic if his hemoglobin continues to drop at a sustained rate I will transfuse him around hemoglobin of 8.5, CTA unremarkable, tagged RBC scan and capsule endoscopy scheduled.  2.  Dyslipidemia.  Statin once taking oral diet.  3.  HTN.  As needed hydralazine.        Condition -  Guarded  Family Communication  :   Wife Rodena Piety [phone] on 04/01/2021  Code Status :  Full  Consults  :  GI  PUD Prophylaxis : PPI   Procedures  :     VCE -  Tagged RBC -   CTA - . No evidence of acute GI bleeding. 2. Focal region of eccentric arterial phase enhancement and small punctate calcifications in the mid small bowel. Mural/intraluminal lesion cannot be confidently excluded. CT enterography or enteroscopy may be useful for further characterization. 3. Extensive Aortic Atherosclerosis (ICD10-170.0), with atheromatous SMA and occluded IMA. Celiac axis remains widely patent. 4. Descending and sigmoid diverticulosis.       Disposition Plan  :    Status is: Observation  DVT Prophylaxis  :    Place TED hose Start: 04/01/21 0918 SCDs Start: 04/01/21 0501   Lab Results  Component Value Date   PLT 146 (L) 04/02/2021    Diet :  Diet Order             Diet NPO time specified  Diet effective midnight                    Inpatient Medications  Scheduled Meds: Continuous Infusions:   PRN Meds:.acetaminophen **OR** acetaminophen, hydrALAZINE  Antibiotics  :    Anti-infectives (From admission, onward)    None        Time Spent in minutes  30   Lala Lund M.D on 04/02/2021 at 10:27 AM  To page go to [email or web address]   [facility] -  Office  [phone]  See all Orders from today for further details    Objective:   Vitals:   04/01/21 1948 04/01/21 2319 04/02/21 0333 04/02/21 0850  BP: (!) 156/77 123/61 (!) 148/66 118/78  Pulse: 78 94 93 94  Resp: 16 16 17    Temp: 98.1 F (36.7 C) 98 F (36.7 C) 98.4 F (36.9 C)   TempSrc: Oral Oral Oral    SpO2: 96% 95% 98% 100%  Weight:      Height:        Wt Readings from Last 3 Encounters:  04/01/21 104.3 kg  12/01/20 104.3 kg  10/07/20 103.9 kg     Intake/Output Summary (Last 24 hours) at 04/02/2021 1027 Last data filed at 04/02/2021 4166 Gross per 24 hour  Intake 844.33 ml  Output 550 ml  Net 294.33 ml     Physical Exam  Awake Alert, No new F.N deficits, Normal affect [place].AT,PERRAL Supple Neck, No JVD,   Symmetrical Chest wall movement, Good air movement bilaterally, CTAB RRR,No Gallops, Rubs or new Murmurs,  +ve B.Sounds, Abd Soft, No tenderness,   No Cyanosis, Clubbing or edema      Data Review:    CBC Recent Labs  Lab 04/01/21 0134 04/01/21 0536 04/01/21 1000 04/01/21 1628 04/01/21 1955 04/02/21 0504  WBC 7.5 6.3 7.5 6.0 6.4 5.5  HGB 11.6* 11.9* 11.2* 11.3* 10.0* 9.3*  HCT 36.8* 36.4* 35.1* 34.1* 30.7* 27.8*  PLT 155 153 150 155 144* 146*  MCV 79.0* 76.6* 77.7* 75.9* 77.1* 76.4*  MCH 24.9* 25.1* 24.8* 25.2* 25.1* 25.5*  MCHC 31.5 32.7 31.9 33.1 32.6 33.5  RDW 17.3* 17.2* 17.3* 17.4* 17.6* 17.4*  LYMPHSABS 0.7  --   --   --   --   --   MONOABS 0.6  --   --   --   --   --   EOSABS 0.1  --   --   --   --   --  BASOSABS 0.1  --   --   --   --   --     Electrolytes Recent Labs  Lab 04/01/21 0134 04/01/21 0140 04/02/21 0504  NA 136  --  136  K 4.2  --  4.1  CL 107  --  107  CO2 22  --  23  GLUCOSE 109*  --  146*  BUN 34*  --  23  CREATININE 1.03  --  1.11  CALCIUM 8.2*  --  8.1*  AST 25  --  15  ALT 16  --  13  ALKPHOS 41  --  37*  BILITOT 0.7  --  0.8  ALBUMIN 3.1*  --  2.8*  MG  --   --  1.8  LATICACIDVEN  --  1.1  --   BNP  --   --  7.9    ------------------------------------------------------------------------------------------------------------------ No results for input(s): CHOL, HDL, LDLCALC, TRIG, CHOLHDL, LDLDIRECT in the last 72 hours.  Lab Results  Component Value Date   HGBA1C 5.3 10/07/2020    No results for  input(s): TSH, T4TOTAL, T3FREE, THYROIDAB in the last 72 hours.  Invalid input(s): FREET3 ------------------------------------------------------------------------------------------------------------------ ID Labs Recent Labs  Lab 04/01/21 0134 04/01/21 0140 04/01/21 0536 04/01/21 1000 04/01/21 1628 04/01/21 1955 04/02/21 0504  WBC 7.5  --  6.3 7.5 6.0 6.4 5.5  PLT 155  --  153 150 155 144* 146*  LATICACIDVEN  --  1.1  --   --   --   --   --   CREATININE 1.03  --   --   --   --   --  1.11   Cardiac Enzymes No results for input(s): CKMB, TROPONINI, MYOGLOBIN in the last 168 hours.  Invalid input(s): CK   Radiology Reports CT ANGIO GI BLEED  Result Date: 04/01/2021 CLINICAL DATA:  Lower GI bleed, blood per rectum EXAM: CTA ABDOMEN AND PELVIS WITHOUT AND WITH CONTRAST TECHNIQUE: Multidetector CT imaging of the abdomen and pelvis was performed using the standard protocol during bolus administration of intravenous contrast. Multiplanar reconstructed images and MIPs were obtained and reviewed to evaluate the vascular anatomy. CONTRAST:  145mL OMNIPAQUE IOHEXOL 350 MG/ML SOLN COMPARISON:  CT 12/12/2019 and previous FINDINGS: VASCULAR Coronary calcifications. Aorta: Moderate calcified atheromatous plaque. No aneurysm, dissection, or stenosis. Celiac: Patent without evidence of aneurysm, dissection, vasculitis or significant stenosis. SMA: Heavily calcified plaque through the length of the SMA resulting in tandem areas of at least mild stenosis but no definite occlusion. Classic distal branch anatomy, with proximal atheromatous involvement. Renals: Single left, with heavily calcified ostial plaque resulting in short segment stenosis of at least moderate severity, patent distally. Duplicated right, superior dominant with calcified ostial plaque resulting in short segment stenosis of at least mild severity, patent distally. IMA: Long segment occlusion from its origin. Inflow: Calcified plaque  scattered throughout bilateral common iliac and internal iliac arteries without aneurysm or stenosis. Extensive heavily calcified plaque through the length of the right external iliac artery resulting in only mild stenosis. The left external iliac is curiously nearly disease-free. Proximal Outflow: Bilateral common femoral and visualized portions of the superficial and profunda femoral arteries are patent without evidence of aneurysm, dissection, vasculitis or significant stenosis. Veins: Patent hepatic veins, portal vein, bilateral renal veins, IVC, and iliac venous system. No venous pathology identified. Review of the MIP images confirms the above findings. NON-VASCULAR Lower chest: No pleural or pericardial effusion. Visualized lung bases clear. Hepatobiliary: No focal liver abnormality is  seen. No gallstones, gallbladder wall thickening, or biliary dilatation. Pancreas: Unremarkable. No pancreatic ductal dilatation or surrounding inflammatory changes. Spleen: Normal in size without focal abnormality. Adrenals/Urinary Tract: Normal adrenal glands. Bilateral renal cysts. No hydronephrosis or urolithiasis. Urinary bladder is distended. Stomach/Bowel: Stomach is incompletely distended, unremarkable. Small bowel decompressed. Focal eccentric enhancement in a left mid abdominal small bowel loop (7: 84-85, 12: 55-56) with 2 regional punctate calcifications , but no continued contrast accumulation on the delayed scan to suggest acute hemorrhage. No discrete mass is evident, evaluation limited in the absence of oral contrast material. Normal appendix. The colon is nondilated. A few distal descending and sigmoid diverticula, without adjacent inflammatory/edematous change. No evidence of active intraluminal hemorrhage. Lymphatic: No abdominal or pelvic adenopathy. Reproductive: Prostate enlargement. Other: No ascites.  No free air. Musculoskeletal: Facet DJD in the lower lumbar spine. No fracture or worrisome bone lesion.  IMPRESSION: 1. No evidence of acute GI bleeding. 2. Focal region of eccentric arterial phase enhancement and small punctate calcifications in the mid small bowel. Mural/intraluminal lesion cannot be confidently excluded. CT enterography or enteroscopy may be useful for further characterization. 3. Extensive Aortic Atherosclerosis (ICD10-170.0), with atheromatous SMA and occluded IMA. Celiac axis remains widely patent. 4. Descending and sigmoid diverticulosis. Electronically Signed   By: Lucrezia Europe M.D.   On: 04/01/2021 12:49

## 2021-04-03 DIAGNOSIS — K50011 Crohn's disease of small intestine with rectal bleeding: Secondary | ICD-10-CM

## 2021-04-03 DIAGNOSIS — K922 Gastrointestinal hemorrhage, unspecified: Secondary | ICD-10-CM | POA: Diagnosis not present

## 2021-04-03 LAB — CBC
HCT: 22.1 % — ABNORMAL LOW (ref 39.0–52.0)
Hemoglobin: 7.2 g/dL — ABNORMAL LOW (ref 13.0–17.0)
MCH: 26.2 pg (ref 26.0–34.0)
MCHC: 32.6 g/dL (ref 30.0–36.0)
MCV: 80.4 fL (ref 80.0–100.0)
Platelets: 129 10*3/uL — ABNORMAL LOW (ref 150–400)
RBC: 2.75 MIL/uL — ABNORMAL LOW (ref 4.22–5.81)
RDW: 17.3 % — ABNORMAL HIGH (ref 11.5–15.5)
WBC: 7.5 10*3/uL (ref 4.0–10.5)
nRBC: 0.3 % — ABNORMAL HIGH (ref 0.0–0.2)

## 2021-04-03 LAB — COMPREHENSIVE METABOLIC PANEL
ALT: 11 U/L (ref 0–44)
AST: 14 U/L — ABNORMAL LOW (ref 15–41)
Albumin: 2.7 g/dL — ABNORMAL LOW (ref 3.5–5.0)
Alkaline Phosphatase: 37 U/L — ABNORMAL LOW (ref 38–126)
Anion gap: 6 (ref 5–15)
BUN: 24 mg/dL — ABNORMAL HIGH (ref 8–23)
CO2: 26 mmol/L (ref 22–32)
Calcium: 8.1 mg/dL — ABNORMAL LOW (ref 8.9–10.3)
Chloride: 105 mmol/L (ref 98–111)
Creatinine, Ser: 1.17 mg/dL (ref 0.61–1.24)
GFR, Estimated: >60 mL/min (ref >60)
Glucose, Bld: 125 mg/dL — ABNORMAL HIGH (ref 70–99)
Potassium: 4.3 mmol/L (ref 3.5–5.1)
Sodium: 137 mmol/L (ref 135–145)
Total Bilirubin: 1.5 mg/dL — ABNORMAL HIGH (ref 0.3–1.2)
Total Protein: 4.5 g/dL — ABNORMAL LOW (ref 6.5–8.1)

## 2021-04-03 LAB — CBC WITH DIFFERENTIAL/PLATELET
Abs Immature Granulocytes: 0.1 10*3/uL — ABNORMAL HIGH (ref 0.00–0.07)
Basophils Absolute: 0.1 10*3/uL (ref 0.0–0.1)
Basophils Relative: 1 %
Eosinophils Absolute: 0.1 10*3/uL (ref 0.0–0.5)
Eosinophils Relative: 1 %
HCT: 25.7 % — ABNORMAL LOW (ref 39.0–52.0)
Hemoglobin: 8.7 g/dL — ABNORMAL LOW (ref 13.0–17.0)
Immature Granulocytes: 1 %
Lymphocytes Relative: 13 %
Lymphs Abs: 1 10*3/uL (ref 0.7–4.0)
MCH: 27 pg (ref 26.0–34.0)
MCHC: 33.9 g/dL (ref 30.0–36.0)
MCV: 79.8 fL — ABNORMAL LOW (ref 80.0–100.0)
Monocytes Absolute: 0.8 10*3/uL (ref 0.1–1.0)
Monocytes Relative: 10 %
Neutro Abs: 5.7 10*3/uL (ref 1.7–7.7)
Neutrophils Relative %: 74 %
Platelets: 135 10*3/uL — ABNORMAL LOW (ref 150–400)
RBC: 3.22 MIL/uL — ABNORMAL LOW (ref 4.22–5.81)
RDW: 17.1 % — ABNORMAL HIGH (ref 11.5–15.5)
WBC: 7.8 10*3/uL (ref 4.0–10.5)
nRBC: 0 % (ref 0.0–0.2)

## 2021-04-03 LAB — BRAIN NATRIURETIC PEPTIDE: B Natriuretic Peptide: 8.5 pg/mL (ref 0.0–100.0)

## 2021-04-03 LAB — GLUCOSE, CAPILLARY
Glucose-Capillary: 114 mg/dL — ABNORMAL HIGH (ref 70–99)
Glucose-Capillary: 110 mg/dL — ABNORMAL HIGH (ref 70–99)
Glucose-Capillary: 122 mg/dL — ABNORMAL HIGH (ref 70–99)

## 2021-04-03 LAB — PREPARE RBC (CROSSMATCH)

## 2021-04-03 LAB — MAGNESIUM: Magnesium: 1.9 mg/dL (ref 1.7–2.4)

## 2021-04-03 MED ORDER — FUROSEMIDE 10 MG/ML IJ SOLN
20.0000 mg | Freq: Once | INTRAMUSCULAR | Status: AC
  Administered 2021-04-03: 20:00:00 20 mg via INTRAVENOUS
  Filled 2021-04-03: qty 2

## 2021-04-03 MED ORDER — LACTATED RINGERS IV SOLN: INTRAVENOUS | Status: DC

## 2021-04-03 MED ORDER — BISACODYL 10 MG RE SUPP
10.0000 mg | Freq: Once | RECTAL | Status: AC
Start: 2021-04-03 — End: 2021-04-03
  Administered 2021-04-03: 18:00:00 10 mg via RECTAL
  Filled 2021-04-03: qty 1

## 2021-04-03 MED ORDER — SODIUM CHLORIDE 0.9% IV SOLUTION: Freq: Once | INTRAVENOUS | Status: AC

## 2021-04-03 MED ORDER — BISACODYL 5 MG PO TBEC
10.0000 mg | DELAYED_RELEASE_TABLET | Freq: Once | ORAL | Status: AC
  Administered 2021-04-04: 05:00:00 10 mg via ORAL
  Filled 2021-04-03: qty 2

## 2021-04-03 NOTE — Progress Notes (Addendum)
Gastroenterology Inpatient Follow-up Note   PATIENT IDENTIFICATION  [name] [name] is a 65 y.o. male who presented with recurrent GI bleeding. Hospital Day: 3  SUBJECTIVE  The patient's video capsule endoscopy has been read by me today Results are as below. Patient is hemodynamically stable but has had a decreased hemoglobin trend. Has had 2 bowel movements today that have been bloody. He has no abdominal pain. No fevers or chills.   OBJECTIVE  Scheduled Inpatient Medications:  Continuous Inpatient Infusions:  PRN Inpatient Medications: acetaminophen **OR** acetaminophen, hydrALAZINE   Physical Examination  Temp:  [98.2 F (36.8 C)-98.7 F (37.1 C)] 98.5 F (36.9 C) (12/25 0831) Pulse Rate:  [71-101] 73 (12/25 0831) Resp:  [18] 18 (12/25 0248) BP: (115-157)/(63-75) 115/67 (12/25 0831) SpO2:  [97 %-100 %] 99 % (12/25 0831) Temp (24hrs), Avg:98.5 F (36.9 C), Min:98.2 F (36.8 C), Max:98.7 F (37.1 C)  Weight: 104.3 kg GEN: NAD, appears stated age, doesn't appear chronically ill, wife at bedside PSYCH: Cooperative, without pressured speech EYE: Conjunctivae pale-pink ENT: MMM CV: Nontachycardic RESP: No audible wheezing GI: NABS, soft, NT/ND, without rebound or guarding MSK/EXT: No lower extremity edema SKIN: No jaundice NEURO:  Alert & Oriented x 3, no focal deficits   Review of Data   Laboratory Studies   Recent Labs  Lab 04/03/21 0526  NA 137  K 4.3  CL 105  CO2 26  BUN 24*  CREATININE 1.17  GLUCOSE 125*  CALCIUM 8.1*  MG 1.9   Recent Labs  Lab 04/03/21 0526  AST 14*  ALT 11  ALKPHOS 37*    Recent Labs  Lab 04/02/21 1007 04/02/21 1842 04/03/21 0526  WBC 6.5 8.0 7.8  HGB 9.0* 7.6* 8.7*  HCT 26.9* 23.5* 25.7*  PLT 167 157 135*   No results for input(s): APTT, INR in the last 168 hours.  Imaging Studies  NM GI Blood Loss  Result Date: 04/02/2021 CLINICAL DATA:  GI bleed; no definitive GI bleed identified on recent CT EXAM: NUCLEAR  MEDICINE GASTROINTESTINAL BLEEDING SCAN TECHNIQUE: Sequential abdominal images were obtained following intravenous administration of Tc-71m labeled red blood cells. RADIOPHARMACEUTICALS:  24.8 mCi Tc-76m pertechnetate in-vitro labeled red cells. COMPARISON:  April 01, 2021 FINDINGS: There is expected radiotracer distribution in the vascular structures. No extravascular accumulation of radiotracer to suggest active GI bleed. IMPRESSION: No active GI bleed identified. Electronically Signed   By: Valentino Saxon M.D.   On: 04/02/2021 14:41    GI Procedures and Studies  Video capsule endoscopy report Gastric erosions and gastritis present. Gastric ulcer vs duodenal ulcers present for extended period of time. Long wait for capsule to pass into the duodenum more distally. Blood noted within 2 hours and 40 minutes of small bowel capsule intubation. Fast transit with blood products throughout small bowel does not allow for adequate visualization of the majority of the small bowel. Blood products/clot noted in the colon within 50 minutes of the large amount of blood.   ASSESSMENT  [name] [name] is a 65 y.o. male with a pmh significant for presumed recent diverticular bleed though cross-sectional imaging had suggested enhancement of the small bowel.    The patient is hemodynamically stable but has dropped his hemoglobin.  Review of his capsule endoscopy shows evidence of active bleeding within the small bowel.  It is not clear if we will be able to reach that with our push enteroscope but if we cannot then he will need referral for balloon assisted enteroscopy likely at  [facility] versus consideration of more invasive interventional radiology arteriography..  The risks and benefits of endoscopic evaluation were discussed with the patient; these include but are not limited to the risk of perforation, infection, bleeding, missed lesions, lack of diagnosis, severe illness requiring hospitalization, as well as  anesthesia and sedation related illnesses.  The patient and/or family is agreeable to proceed.  All patient questions were answered to the best of my ability, and the patient agrees to the aforementioned plan of action with follow-up as indicated.   PLAN/RECOMMENDATIONS  N.p.o. at midnight Dulcolax 10 mg x 2 tonight Proceed with push enteroscopy attempt on 12/26 (this case is considered emergent due to active blood loss anemia) Trend hemoglobin/hematocrit with goal hemoglobin greater than 7 for procedures   Please page/call with questions or concerns.   Justice Britain, MD [facility] Office # [phone]    LOS: 2 days  [name]  04/03/2021, 3:14 PM

## 2021-04-03 NOTE — H&P (View-Only) (Signed)
Gastroenterology Inpatient Follow-up Note   PATIENT IDENTIFICATION  [name] [name] is a 65 y.o. male who presented with recurrent GI bleeding. Hospital Day: 3  SUBJECTIVE  The patient's video capsule endoscopy has been read by me today Results are as below. Patient is hemodynamically stable but has had a decreased hemoglobin trend. Has had 2 bowel movements today that have been bloody. He has no abdominal pain. No fevers or chills.   OBJECTIVE  Scheduled Inpatient Medications:  Continuous Inpatient Infusions:  PRN Inpatient Medications: acetaminophen **OR** acetaminophen, hydrALAZINE   Physical Examination  Temp:  [98.2 F (36.8 C)-98.7 F (37.1 C)] 98.5 F (36.9 C) (12/25 0831) Pulse Rate:  [71-101] 73 (12/25 0831) Resp:  [18] 18 (12/25 0248) BP: (115-157)/(63-75) 115/67 (12/25 0831) SpO2:  [97 %-100 %] 99 % (12/25 0831) Temp (24hrs), Avg:98.5 F (36.9 C), Min:98.2 F (36.8 C), Max:98.7 F (37.1 C)  Weight: 104.3 kg GEN: NAD, appears stated age, doesn't appear chronically ill, wife at bedside PSYCH: Cooperative, without pressured speech EYE: Conjunctivae pale-pink ENT: MMM CV: Nontachycardic RESP: No audible wheezing GI: NABS, soft, NT/ND, without rebound or guarding MSK/EXT: No lower extremity edema SKIN: No jaundice NEURO:  Alert & Oriented x 3, no focal deficits   Review of Data   Laboratory Studies   Recent Labs  Lab 04/03/21 0526  NA 137  K 4.3  CL 105  CO2 26  BUN 24*  CREATININE 1.17  GLUCOSE 125*  CALCIUM 8.1*  MG 1.9   Recent Labs  Lab 04/03/21 0526  AST 14*  ALT 11  ALKPHOS 37*    Recent Labs  Lab 04/02/21 1007 04/02/21 1842 04/03/21 0526  WBC 6.5 8.0 7.8  HGB 9.0* 7.6* 8.7*  HCT 26.9* 23.5* 25.7*  PLT 167 157 135*   No results for input(s): APTT, INR in the last 168 hours.  Imaging Studies  NM GI Blood Loss  Result Date: 04/02/2021 CLINICAL DATA:  GI bleed; no definitive GI bleed identified on recent CT EXAM: NUCLEAR  MEDICINE GASTROINTESTINAL BLEEDING SCAN TECHNIQUE: Sequential abdominal images were obtained following intravenous administration of Tc-35m labeled red blood cells. RADIOPHARMACEUTICALS:  24.8 mCi Tc-64m pertechnetate in-vitro labeled red cells. COMPARISON:  April 01, 2021 FINDINGS: There is expected radiotracer distribution in the vascular structures. No extravascular accumulation of radiotracer to suggest active GI bleed. IMPRESSION: No active GI bleed identified. Electronically Signed   By: Valentino Saxon M.D.   On: 04/02/2021 14:41    GI Procedures and Studies  Video capsule endoscopy report Gastric erosions and ulcers are present with some hematin present. Long wait for capsule to pass into the duodenum. Blood noted within 25 minutes of small bowel capsule intubation. Fast transit with blood products throughout small bowel does not allow for adequate visualization of the majority of the small bowel. Blood products/clot noted in the colon.   ASSESSMENT  [name] is a 65 y.o. male with a pmh significant for presumed recent diverticular bleed though cross-sectional imaging had suggested enhancement of the small bowel.    The patient is hemodynamically stable but has dropped his hemoglobin.  Review of his capsule endoscopy shows evidence of active bleeding within the small bowel.  It is not clear if we will be able to reach that with our push enteroscope but if we cannot then he will need referral for balloon assisted enteroscopy likely at [facility] versus consideration of more invasive interventional radiology arteriography..  The risks and benefits of endoscopic evaluation were discussed with  the patient; these include but are not limited to the risk of perforation, infection, bleeding, missed lesions, lack of diagnosis, severe illness requiring hospitalization, as well as anesthesia and sedation related illnesses.  The patient and/or family is agreeable to proceed.  All patient questions were  answered to the best of my ability, and the patient agrees to the aforementioned plan of action with follow-up as indicated.   PLAN/RECOMMENDATIONS  N.p.o. at midnight Dulcolax 10 mg x 2 tonight Proceed with push enteroscopy attempt on 12/26 (this case is considered emergent due to active blood loss anemia) Trend hemoglobin/hematocrit with goal hemoglobin greater than 7 for procedures   Please page/call with questions or concerns.   Justice Britain, MD [facility] Office # [phone]    LOS: 2 days  [name]  04/03/2021, 3:14 PM

## 2021-04-03 NOTE — Progress Notes (Signed)
Gastroenterology Inpatient Follow-up Note   PATIENT IDENTIFICATION  [name] [name] is a 65 y.o. male who is admitted with recurrent GI bleeding concerning for potential diverticular hemorrhage versus a potential small bowel lesion (previously evaluated at [facility] earlier this year with extensive GI evaluation). Hospital Day: 3  SUBJECTIVE  I am evaluating the patient this afternoon as he is concerned about the role of repeat video capsule endoscopy.  He wants to undergo colonoscopy because he believes that is the next step for him.  Patient had undergone his tagged RBC scan and nothing was found to be actively oozing at that time.  Extensive discussion with the patient and wife this afternoon.  He had some passage of bright red blood while preparing early this morning which led to the tagged RBC.  Hemogram was noted. No new abdominal pain.  OBJECTIVE  Scheduled Inpatient Medications:  Continuous Inpatient Infusions:   lactated ringers 50 mL/hr at 04/02/21 2328   PRN Inpatient Medications: acetaminophen **OR** acetaminophen, hydrALAZINE   Physical Examination  Temp:  [98.2 F (36.8 C)-98.9 F (37.2 C)] 98.4 F (36.9 C) (12/25 0248) Pulse Rate:  [71-101] 71 (12/25 0248) Resp:  [18] 18 (12/25 0248) BP: (118-150)/(63-83) 149/75 (12/25 0248) SpO2:  [98 %-100 %] 99 % (12/25 0248) Temp (24hrs), Avg:98.5 F (36.9 C), Min:98.2 F (36.8 C), Max:98.9 F (37.2 C)  Weight: 104.3 kg GEN: NAD, appears stated age, doesn't appear chronically ill, wife at bedside PSYCH: Cooperative, without pressured speech EYE: Conjunctivae pale-pink, sclerae anicteric ENT: MMM CV: Nontachycardic RESP: No audible wheezing GI: NABS, soft, NT/ND, without rebound MSK/EXT: No lower extremity edema SKIN: No jaundice NEURO:  Alert & Oriented x 3, no focal deficits   Review of Data   Laboratory Studies   Recent Labs  Lab 04/02/21 0504  NA 136  K 4.1  CL 107  CO2 23  BUN 23  CREATININE 1.11   GLUCOSE 146*  CALCIUM 8.1*  MG 1.8   Recent Labs  Lab 04/02/21 0504  AST 15  ALT 13  ALKPHOS 37*    Recent Labs  Lab 04/02/21 0504 04/02/21 1007  WBC 5.5 6.5  HGB 9.3* 9.0*  HCT 27.8* 26.9*  PLT 146* 167     Imaging Studies  NM GI Blood Loss  Result Date: 04/02/2021 CLINICAL DATA:  GI bleed; no definitive GI bleed identified on recent CT EXAM: NUCLEAR MEDICINE GASTROINTESTINAL BLEEDING SCAN TECHNIQUE: Sequential abdominal images were obtained following intravenous administration of Tc-23m labeled red blood cells. RADIOPHARMACEUTICALS:  24.8 mCi Tc-72m pertechnetate in-vitro labeled red cells. COMPARISON:  April 01, 2021 FINDINGS: There is expected radiotracer distribution in the vascular structures. No extravascular accumulation of radiotracer to suggest active GI bleed. IMPRESSION: No active GI bleed identified. Electronically Signed   By: Valentino Saxon M.D.   On: 04/02/2021 14:41   CT ANGIO GI BLEED  Result Date: 04/01/2021 CLINICAL DATA:  Lower GI bleed, blood per rectum EXAM: CTA ABDOMEN AND PELVIS WITHOUT AND WITH CONTRAST TECHNIQUE: Multidetector CT imaging of the abdomen and pelvis was performed using the standard protocol during bolus administration of intravenous contrast. Multiplanar reconstructed images and MIPs were obtained and reviewed to evaluate the vascular anatomy. CONTRAST:  147mL OMNIPAQUE IOHEXOL 350 MG/ML SOLN COMPARISON:  CT 12/12/2019 and previous FINDINGS: VASCULAR Coronary calcifications. Aorta: Moderate calcified atheromatous plaque. No aneurysm, dissection, or stenosis. Celiac: Patent without evidence of aneurysm, dissection, vasculitis or significant stenosis. SMA: Heavily calcified plaque through the length of the SMA resulting in  tandem areas of at least mild stenosis but no definite occlusion. Classic distal branch anatomy, with proximal atheromatous involvement. Renals: Single left, with heavily calcified ostial plaque resulting in short  segment stenosis of at least moderate severity, patent distally. Duplicated right, superior dominant with calcified ostial plaque resulting in short segment stenosis of at least mild severity, patent distally. IMA: Long segment occlusion from its origin. Inflow: Calcified plaque scattered throughout bilateral common iliac and internal iliac arteries without aneurysm or stenosis. Extensive heavily calcified plaque through the length of the right external iliac artery resulting in only mild stenosis. The left external iliac is curiously nearly disease-free. Proximal Outflow: Bilateral common femoral and visualized portions of the superficial and profunda femoral arteries are patent without evidence of aneurysm, dissection, vasculitis or significant stenosis. Veins: Patent hepatic veins, portal vein, bilateral renal veins, IVC, and iliac venous system. No venous pathology identified. Review of the MIP images confirms the above findings. NON-VASCULAR Lower chest: No pleural or pericardial effusion. Visualized lung bases clear. Hepatobiliary: No focal liver abnormality is seen. No gallstones, gallbladder wall thickening, or biliary dilatation. Pancreas: Unremarkable. No pancreatic ductal dilatation or surrounding inflammatory changes. Spleen: Normal in size without focal abnormality. Adrenals/Urinary Tract: Normal adrenal glands. Bilateral renal cysts. No hydronephrosis or urolithiasis. Urinary bladder is distended. Stomach/Bowel: Stomach is incompletely distended, unremarkable. Small bowel decompressed. Focal eccentric enhancement in a left mid abdominal small bowel loop (7: 84-85, 12: 55-56) with 2 regional punctate calcifications , but no continued contrast accumulation on the delayed scan to suggest acute hemorrhage. No discrete mass is evident, evaluation limited in the absence of oral contrast material. Normal appendix. The colon is nondilated. A few distal descending and sigmoid diverticula, without adjacent  inflammatory/edematous change. No evidence of active intraluminal hemorrhage. Lymphatic: No abdominal or pelvic adenopathy. Reproductive: Prostate enlargement. Other: No ascites.  No free air. Musculoskeletal: Facet DJD in the lower lumbar spine. No fracture or worrisome bone lesion. IMPRESSION: 1. No evidence of acute GI bleeding. 2. Focal region of eccentric arterial phase enhancement and small punctate calcifications in the mid small bowel. Mural/intraluminal lesion cannot be confidently excluded. CT enterography or enteroscopy may be useful for further characterization. 3. Extensive Aortic Atherosclerosis (ICD10-170.0), with atheromatous SMA and occluded IMA. Celiac axis remains widely patent. 4. Descending and sigmoid diverticulosis. Electronically Signed   By: Lucrezia Europe M.D.   On: 04/01/2021 12:49    GI Procedures and Studies  This was reviewed on the initial consultation note no new studies here   ASSESSMENT  Mr. Kunst is a 65 y.o. male who is admitted with recurrent GI bleeding concerning for potential diverticular hemorrhage versus a potential small bowel lesion (previously evaluated at [facility] earlier this year with extensive GI evaluation).  After an extensive conversation with the patient and patient's wife they have agreed to move forward with the video capsule endoscopy today.  The tagged RBC scan was negative.  Most assuredly this will end up being a diverticular hemorrhage again.  I do not have plan for an endoscopic reevaluation from above or below unless something is found on a video capsule endoscopy that suggest active bleeding.  If the patient has recurrent bleeding that is stuttering and a negative video capsule endoscopy, then the consideration of repeat colonoscopy will be had but there is not an indication for [place] to just plan to repeat a colonoscopy when he had an adequate preparation colonoscopy within the last 4 months done at [facility].  Time will tell based  on how the  patient does and his overall hemoglobin trend.  Patient will be able to eat after his capsule is performed/swallowed today.  He understands the potential risk For retainment, although he did not have this happen on his last procedure.  We will pick up the capsule tomorrow and evaluate that at some point tomorrow.  Should the patient have hemodynamically unstable recurrent bleeding while he is an inpatient then consideration of a cross-sectional CT abdomen/pelvis angiography should be had by the medical service.  All patient questions were answered to the best of my ability, and the patient agrees to the aforementioned plan of action with follow-up as indicated.   PLAN/RECOMMENDATIONS  Trend hemoglobin/hematocrit as you are Proceed with video capsule endoscopy today - Diet as per video capsule endoscopy protocol Follow-up a video capsule endoscopy on 12/25 or 12/26 based on it being uploaded If patient has significant recurrent bleeding then consider CTA abdomen/pelvis No plan for endoscopic evaluation at this time - If video capsule endoscopy shows evidence of a lesion in the small bowel or active bleeding then consideration of enteroscopy versus need for device assisted enteroscopy will have to be considered - No plan for colonoscopy unless patient has negative video capsule endoscopy and stuttering bleeding   Please page/call with questions or concerns.   Justice Britain, MD [facility] Office # [phone]    LOS: 2 days  [name]  04/03/2021, 4:18 AM

## 2021-04-03 NOTE — Progress Notes (Signed)
PROGRESS NOTE                                                                                                                                                                                                             Patient Demographics:    [name] [name], is a 65 y.o. male, DOB - [birth year], ELF:[id number]  Outpatient Primary MD for the patient is Laurey Morale, MD    LOS - 2  Admit date - 04/01/2021    Chief Complaint  Patient presents with   GI Problem    Blood in stool       Brief Narrative (HPI from H&P)  [name] [name] is a 65 y.o. male with history of hypertension, massive GI bleeding requiring 10 units of packed RBC transfusion at [facility] in August of this year with thorough work-up which was inconclusive but potential culprit was thought to be diverticular bleed, presents to the ER with complaints of having passed blood per rectum.  Patient states last night around 11:00 patient went to the bathroom and had a black tarry bowel movement following which patient felt faint and had to lay on the floor.  Then following which patient had another bowel movement which was frankly bloody.  Was large quantity.  He came to the ER with ongoing lower GI bleed, orthostatic hypotension and near syncope.  Was admitted for lower GI bleed   Subjective:   Patient in bed, appears comfortable, denies any headache, no fever, no chest pain or pressure, no shortness of breath , no abdominal pain. No new focal weakness. Still having some bloody bowel movements intermittently.   Assessment  & Plan :     Acute blood loss related anemia due to ongoing active lower GI bleed with orthostatic hypotension - he has had thorough work-up in August of this year at [facility] with potential culprit being diverticular but not completely clear.  On board, he is s/p 2 units of packed RBC transfusion night of 04/02/2021, continue to  monitor H&H closely, if he bleeds again and remains symptomatic and hemoglobin is below 8.5 with symptoms will transfuse again.  2.  Dyslipidemia.  Statin once taking oral diet.  3.  HTN.  As needed hydralazine.        Condition -  Guarded  Family Communication  :  Wife Rodena Piety [phone] on 04/01/2021  Code Status :  Full  Consults  :  GI, IR  PUD Prophylaxis : PPI   Procedures  :     VCE -  Tagged RBC - Negative  CTA - . No evidence of acute GI bleeding. 2. Focal region of eccentric arterial phase enhancement and small punctate calcifications in the mid small bowel. Mural/intraluminal lesion cannot be confidently excluded. CT enterography or enteroscopy may be useful for further characterization. 3. Extensive Aortic Atherosclerosis (ICD10-170.0), with atheromatous SMA and occluded IMA. Celiac axis remains widely patent. 4. Descending and sigmoid diverticulosis.       Disposition Plan  :    Status is inpatient  DVT Prophylaxis  :    Place TED hose Start: 04/01/21 0918 SCDs Start: 04/01/21 0501   Lab Results  Component Value Date   PLT 135 (L) 04/03/2021    Diet :  Diet Order             Diet regular Room service appropriate? Yes; Fluid consistency: Thin  Diet effective now                    Inpatient Medications  Scheduled Meds: Continuous Infusions:  lactated ringers 50 mL/hr at 04/02/21 2328    PRN Meds:.acetaminophen **OR** acetaminophen, hydrALAZINE  Antibiotics  :    Anti-infectives (From admission, onward)    None        Time Spent in minutes  30   Lala Lund M.D on 04/03/2021 at 11:32 AM  To page go to [email or web address]   [facility] -  Office  [phone]  See all Orders from today for further details    Objective:   Vitals:   04/03/21 0010 04/03/21 0248 04/03/21 0400 04/03/21 0831  BP: 140/64 (!) 149/75 (!) 157/65 115/67  Pulse: 75 71 74 73  Resp: 18 18    Temp: 98.2 F (36.8 C) 98.4 F (36.9 C) 98.3  F (36.8 C) 98.5 F (36.9 C)  TempSrc: Oral Oral Oral Oral  SpO2: 99% 99% 97% 99%  Weight:      Height:        Wt Readings from Last 3 Encounters:  04/01/21 104.3 kg  12/01/20 104.3 kg  10/07/20 103.9 kg     Intake/Output Summary (Last 24 hours) at 04/03/2021 1132 Last data filed at 04/03/2021 0412 Gross per 24 hour  Intake 922.69 ml  Output 300 ml  Net 622.69 ml     Physical Exam  Awake Alert, No new F.N deficits, Normal affect [place].AT,PERRAL Supple Neck, No JVD,   Symmetrical Chest wall movement, Good air movement bilaterally, CTAB RRR,No Gallops, Rubs or new Murmurs,  +ve B.Sounds, Abd Soft, No tenderness,   No Cyanosis, Clubbing or edema       Data Review:    CBC Recent Labs  Lab 04/01/21 0134 04/01/21 0536 04/01/21 1955 04/02/21 0504 04/02/21 1007 04/02/21 1842 04/03/21 0526  WBC 7.5   < > 6.4 5.5 6.5 8.0 7.8  HGB 11.6*   < > 10.0* 9.3* 9.0* 7.6* 8.7*  HCT 36.8*   < > 30.7* 27.8* 26.9* 23.5* 25.7*  PLT 155   < > 144* 146* 167 157 135*  MCV 79.0*   < > 77.1* 76.4* 77.1* 77.8* 79.8*  MCH 24.9*   < > 25.1* 25.5* 25.8* 25.2* 27.0  MCHC 31.5   < > 32.6 33.5 33.5 32.3 33.9  RDW 17.3*   < > 17.6* 17.4* 17.6* 17.8* 17.1*  LYMPHSABS 0.7  --   --   --   --   --  1.0  MONOABS 0.6  --   --   --   --   --  0.8  EOSABS 0.1  --   --   --   --   --  0.1  BASOSABS 0.1  --   --   --   --   --  0.1   < > = values in this interval not displayed.    Electrolytes Recent Labs  Lab 04/01/21 0134 04/01/21 0140 04/02/21 0504 04/03/21 0526  NA 136  --  136 137  K 4.2  --  4.1 4.3  CL 107  --  107 105  CO2 22  --  23 26  GLUCOSE 109*  --  146* 125*  BUN 34*  --  23 24*  CREATININE 1.03  --  1.11 1.17  CALCIUM 8.2*  --  8.1* 8.1*  AST 25  --  15 14*  ALT 16  --  13 11  ALKPHOS 41  --  37* 37*  BILITOT 0.7  --  0.8 1.5*  ALBUMIN 3.1*  --  2.8* 2.7*  MG  --   --  1.8 1.9  LATICACIDVEN  --  1.1  --   --   BNP  --   --  7.9 8.5     ------------------------------------------------------------------------------------------------------------------ No results for input(s): CHOL, HDL, LDLCALC, TRIG, CHOLHDL, LDLDIRECT in the last 72 hours.  Lab Results  Component Value Date   HGBA1C 5.3 10/07/2020    No results for input(s): TSH, T4TOTAL, T3FREE, THYROIDAB in the last 72 hours.  Invalid input(s): FREET3 ------------------------------------------------------------------------------------------------------------------ ID Labs Recent Labs  Lab 04/01/21 0134 04/01/21 0140 04/01/21 0536 04/01/21 1955 04/02/21 0504 04/02/21 1007 04/02/21 1842 04/03/21 0526  WBC 7.5  --    < > 6.4 5.5 6.5 8.0 7.8  PLT 155  --    < > 144* 146* 167 157 135*  LATICACIDVEN  --  1.1  --   --   --   --   --   --   CREATININE 1.03  --   --   --  1.11  --   --  1.17   < > = values in this interval not displayed.   Cardiac Enzymes No results for input(s): CKMB, TROPONINI, MYOGLOBIN in the last 168 hours.  Invalid input(s): CK   Radiology Reports NM GI Blood Loss  Result Date: 04/02/2021 CLINICAL DATA:  GI bleed; no definitive GI bleed identified on recent CT EXAM: NUCLEAR MEDICINE GASTROINTESTINAL BLEEDING SCAN TECHNIQUE: Sequential abdominal images were obtained following intravenous administration of Tc-26m labeled red blood cells. RADIOPHARMACEUTICALS:  24.8 mCi Tc-50m pertechnetate in-vitro labeled red cells. COMPARISON:  April 01, 2021 FINDINGS: There is expected radiotracer distribution in the vascular structures. No extravascular accumulation of radiotracer to suggest active GI bleed. IMPRESSION: No active GI bleed identified. Electronically Signed   By: Valentino Saxon M.D.   On: 04/02/2021 14:41   CT ANGIO GI BLEED  Result Date: 04/01/2021 CLINICAL DATA:  Lower GI bleed, blood per rectum EXAM: CTA ABDOMEN AND PELVIS WITHOUT AND WITH CONTRAST TECHNIQUE: Multidetector CT imaging of the abdomen and pelvis was performed  using the standard protocol during bolus administration of intravenous contrast. Multiplanar reconstructed images and MIPs were obtained and reviewed to evaluate the vascular anatomy. CONTRAST:  164mL OMNIPAQUE IOHEXOL 350 MG/ML SOLN COMPARISON:  CT 12/12/2019 and previous FINDINGS: VASCULAR Coronary  calcifications. Aorta: Moderate calcified atheromatous plaque. No aneurysm, dissection, or stenosis. Celiac: Patent without evidence of aneurysm, dissection, vasculitis or significant stenosis. SMA: Heavily calcified plaque through the length of the SMA resulting in tandem areas of at least mild stenosis but no definite occlusion. Classic distal branch anatomy, with proximal atheromatous involvement. Renals: Single left, with heavily calcified ostial plaque resulting in short segment stenosis of at least moderate severity, patent distally. Duplicated right, superior dominant with calcified ostial plaque resulting in short segment stenosis of at least mild severity, patent distally. IMA: Long segment occlusion from its origin. Inflow: Calcified plaque scattered throughout bilateral common iliac and internal iliac arteries without aneurysm or stenosis. Extensive heavily calcified plaque through the length of the right external iliac artery resulting in only mild stenosis. The left external iliac is curiously nearly disease-free. Proximal Outflow: Bilateral common femoral and visualized portions of the superficial and profunda femoral arteries are patent without evidence of aneurysm, dissection, vasculitis or significant stenosis. Veins: Patent hepatic veins, portal vein, bilateral renal veins, IVC, and iliac venous system. No venous pathology identified. Review of the MIP images confirms the above findings. NON-VASCULAR Lower chest: No pleural or pericardial effusion. Visualized lung bases clear. Hepatobiliary: No focal liver abnormality is seen. No gallstones, gallbladder wall thickening, or biliary dilatation. Pancreas:  Unremarkable. No pancreatic ductal dilatation or surrounding inflammatory changes. Spleen: Normal in size without focal abnormality. Adrenals/Urinary Tract: Normal adrenal glands. Bilateral renal cysts. No hydronephrosis or urolithiasis. Urinary bladder is distended. Stomach/Bowel: Stomach is incompletely distended, unremarkable. Small bowel decompressed. Focal eccentric enhancement in a left mid abdominal small bowel loop (7: 84-85, 12: 55-56) with 2 regional punctate calcifications , but no continued contrast accumulation on the delayed scan to suggest acute hemorrhage. No discrete mass is evident, evaluation limited in the absence of oral contrast material. Normal appendix. The colon is nondilated. A few distal descending and sigmoid diverticula, without adjacent inflammatory/edematous change. No evidence of active intraluminal hemorrhage. Lymphatic: No abdominal or pelvic adenopathy. Reproductive: Prostate enlargement. Other: No ascites.  No free air. Musculoskeletal: Facet DJD in the lower lumbar spine. No fracture or worrisome bone lesion. IMPRESSION: 1. No evidence of acute GI bleeding. 2. Focal region of eccentric arterial phase enhancement and small punctate calcifications in the mid small bowel. Mural/intraluminal lesion cannot be confidently excluded. CT enterography or enteroscopy may be useful for further characterization. 3. Extensive Aortic Atherosclerosis (ICD10-170.0), with atheromatous SMA and occluded IMA. Celiac axis remains widely patent. 4. Descending and sigmoid diverticulosis. Electronically Signed   By: Lucrezia Europe M.D.   On: 04/01/2021 12:49

## 2021-04-03 NOTE — Social Work (Signed)
°  Transition of Care [facility]) Screening Note   Patient Details  Name: [name] [name] Date of Birth: [birth year]   Transition of Care [facility]) CM/SW Contact:    Coralee Pesa, [place] Phone Number: 04/03/2021, 10:29 AM    Transition of Care Department [facility]) has reviewed patient and no TOC needs have been identified at this time. We will continue to monitor patient advancement through interdisciplinary progression rounds. If new patient transition needs arise, please place a TOC consult.

## 2021-04-04 ENCOUNTER — Inpatient Hospital Stay (HOSPITAL_COMMUNITY): Payer: Medicare Other | Admitting: Anesthesiology

## 2021-04-04 ENCOUNTER — Encounter (HOSPITAL_COMMUNITY): Payer: Self-pay | Admitting: Internal Medicine

## 2021-04-04 ENCOUNTER — Inpatient Hospital Stay (HOSPITAL_COMMUNITY): Payer: Medicare Other

## 2021-04-04 ENCOUNTER — Encounter (HOSPITAL_COMMUNITY)
Admission: EM | Disposition: A | Discharge status: Other Acute Inpatient Hospital | Payer: Self-pay | Source: Home / Self Care | Attending: Internal Medicine

## 2021-04-04 ENCOUNTER — Other Ambulatory Visit: Payer: Self-pay | Admitting: Family Medicine

## 2021-04-04 DIAGNOSIS — I10 Essential (primary) hypertension: Secondary | ICD-10-CM

## 2021-04-04 DIAGNOSIS — K269 Duodenal ulcer, unspecified as acute or chronic, without hemorrhage or perforation: Secondary | ICD-10-CM

## 2021-04-04 DIAGNOSIS — K922 Gastrointestinal hemorrhage, unspecified: Secondary | ICD-10-CM | POA: Diagnosis not present

## 2021-04-04 DIAGNOSIS — K3189 Other diseases of stomach and duodenum: Secondary | ICD-10-CM

## 2021-04-04 DIAGNOSIS — K2970 Gastritis, unspecified, without bleeding: Secondary | ICD-10-CM

## 2021-04-04 HISTORY — PX: ENTEROSCOPY: SHX5533

## 2021-04-04 HISTORY — PX: SUBMUCOSAL TATTOO INJECTION: SHX6856

## 2021-04-04 HISTORY — PX: IR ANGIOGRAM VISCERAL SELECTIVE: IMG657

## 2021-04-04 HISTORY — PX: IR US GUIDE VASC ACCESS LEFT: IMG2389

## 2021-04-04 HISTORY — PX: BIOPSY: SHX5522

## 2021-04-04 LAB — CBC WITH DIFFERENTIAL/PLATELET
Abs Immature Granulocytes: 0.19 10*3/uL — ABNORMAL HIGH (ref 0.00–0.07)
Basophils Absolute: 0.1 10*3/uL (ref 0.0–0.1)
Basophils Relative: 1 %
Eosinophils Absolute: 0.2 10*3/uL (ref 0.0–0.5)
Eosinophils Relative: 2 %
HCT: 24.2 % — ABNORMAL LOW (ref 39.0–52.0)
Hemoglobin: 8.1 g/dL — ABNORMAL LOW (ref 13.0–17.0)
Immature Granulocytes: 2 %
Lymphocytes Relative: 13 %
Lymphs Abs: 1.1 10*3/uL (ref 0.7–4.0)
MCH: 27.4 pg (ref 26.0–34.0)
MCHC: 33.5 g/dL (ref 30.0–36.0)
MCV: 81.8 fL (ref 80.0–100.0)
Monocytes Absolute: 0.8 10*3/uL (ref 0.1–1.0)
Monocytes Relative: 10 %
Neutro Abs: 5.9 10*3/uL (ref 1.7–7.7)
Neutrophils Relative %: 72 %
Platelets: 125 10*3/uL — ABNORMAL LOW (ref 150–400)
RBC: 2.96 MIL/uL — ABNORMAL LOW (ref 4.22–5.81)
RDW: 16.7 % — ABNORMAL HIGH (ref 11.5–15.5)
WBC: 8.1 10*3/uL (ref 4.0–10.5)
nRBC: 0.2 % (ref 0.0–0.2)

## 2021-04-04 LAB — CBC
HCT: 22.1 % — ABNORMAL LOW (ref 39.0–52.0)
Hemoglobin: 7.5 g/dL — ABNORMAL LOW (ref 13.0–17.0)
MCH: 28 pg (ref 26.0–34.0)
MCHC: 33.9 g/dL (ref 30.0–36.0)
MCV: 82.5 fL (ref 80.0–100.0)
Platelets: 130 10*3/uL — ABNORMAL LOW (ref 150–400)
RBC: 2.68 MIL/uL — ABNORMAL LOW (ref 4.22–5.81)
RDW: 16.7 % — ABNORMAL HIGH (ref 11.5–15.5)
WBC: 9.3 10*3/uL (ref 4.0–10.5)
nRBC: 0 % (ref 0.0–0.2)

## 2021-04-04 LAB — PREPARE RBC (CROSSMATCH)

## 2021-04-04 LAB — BPAM RBC
Blood Product Expiration Date: 202301022359
Blood Product Expiration Date: 202301022359
Blood Product Expiration Date: 202301022359
Blood Product Expiration Date: 202301022359
ISSUE DATE / TIME: 202212242035
ISSUE DATE / TIME: 202212242345
ISSUE DATE / TIME: 202212251633
ISSUE DATE / TIME: 202212252026
Unit Type and Rh: 6200
Unit Type and Rh: 6200
Unit Type and Rh: 6200
Unit Type and Rh: 6200

## 2021-04-04 LAB — TYPE AND SCREEN
ABO/RH(D): A POS
Antibody Screen: NEGATIVE
Unit division: 0
Unit division: 0
Unit division: 0
Unit division: 0

## 2021-04-04 LAB — COMPREHENSIVE METABOLIC PANEL
ALT: 11 U/L (ref 0–44)
AST: 14 U/L — ABNORMAL LOW (ref 15–41)
Albumin: 2.3 g/dL — ABNORMAL LOW (ref 3.5–5.0)
Alkaline Phosphatase: 30 U/L — ABNORMAL LOW (ref 38–126)
Anion gap: 7 (ref 5–15)
BUN: 27 mg/dL — ABNORMAL HIGH (ref 8–23)
CO2: 25 mmol/L (ref 22–32)
Calcium: 7.5 mg/dL — ABNORMAL LOW (ref 8.9–10.3)
Chloride: 105 mmol/L (ref 98–111)
Creatinine, Ser: 1.17 mg/dL (ref 0.61–1.24)
GFR, Estimated: >60 mL/min (ref >60)
Glucose, Bld: 111 mg/dL — ABNORMAL HIGH (ref 70–99)
Potassium: 4 mmol/L (ref 3.5–5.1)
Sodium: 137 mmol/L (ref 135–145)
Total Bilirubin: 0.9 mg/dL (ref 0.3–1.2)
Total Protein: 4 g/dL — ABNORMAL LOW (ref 6.5–8.1)

## 2021-04-04 LAB — BRAIN NATRIURETIC PEPTIDE: B Natriuretic Peptide: 11 pg/mL (ref 0.0–100.0)

## 2021-04-04 LAB — MAGNESIUM: Magnesium: 1.9 mg/dL (ref 1.7–2.4)

## 2021-04-04 LAB — GLUCOSE, CAPILLARY
Glucose-Capillary: 126 mg/dL — ABNORMAL HIGH (ref 70–99)
Glucose-Capillary: 108 mg/dL — ABNORMAL HIGH (ref 70–99)

## 2021-04-04 SURGERY — ENTEROSCOPY
Anesthesia: Monitor Anesthesia Care

## 2021-04-04 MED ORDER — FENTANYL CITRATE (PF) 100 MCG/2ML IJ SOLN
INTRAMUSCULAR | Status: AC
  Filled 2021-04-04: qty 2

## 2021-04-04 MED ORDER — PROPOFOL 10 MG/ML IV BOLUS
INTRAVENOUS | Status: DC | PRN
  Administered 2021-04-04: 50 mg via INTRAVENOUS

## 2021-04-04 MED ORDER — LACTATED RINGERS IV SOLN: INTRAVENOUS | Status: AC

## 2021-04-04 MED ORDER — MIDAZOLAM HCL 2 MG/2ML IJ SOLN
INTRAMUSCULAR | Status: DC | PRN
  Administered 2021-04-04: 1 mg via INTRAVENOUS

## 2021-04-04 MED ORDER — PHENYLEPHRINE 40 MCG/ML (10ML) SYRINGE FOR IV PUSH (FOR BLOOD PRESSURE SUPPORT)
PREFILLED_SYRINGE | INTRAVENOUS | Status: DC | PRN
Start: 2021-04-04 — End: 2021-04-04
  Administered 2021-04-04: 80 ug via INTRAVENOUS

## 2021-04-04 MED ORDER — PROPOFOL 500 MG/50ML IV EMUL
INTRAVENOUS | Status: DC | PRN
  Administered 2021-04-04: 100 ug/kg/min via INTRAVENOUS

## 2021-04-04 MED ORDER — LIDOCAINE HCL 1 % IJ SOLN
INTRAMUSCULAR | Status: AC | PRN
  Administered 2021-04-04: 5 mL

## 2021-04-04 MED ORDER — PROPOFOL 10 MG/ML IV BOLUS
INTRAVENOUS | Status: AC
  Filled 2021-04-04: qty 20

## 2021-04-04 MED ORDER — LIDOCAINE HCL 1 % IJ SOLN
INTRAMUSCULAR | Status: AC
  Filled 2021-04-04: qty 20

## 2021-04-04 MED ORDER — MIDAZOLAM HCL 2 MG/2ML IJ SOLN
INTRAMUSCULAR | Status: AC
  Filled 2021-04-04: qty 2

## 2021-04-04 MED ORDER — IOHEXOL 350 MG/ML SOLN
100.0000 mL | Freq: Once | INTRAVENOUS | Status: AC | PRN
  Administered 2021-04-04: 12:00:00 100 mL via INTRAVENOUS

## 2021-04-04 MED ORDER — MIDAZOLAM HCL 2 MG/2ML IJ SOLN
INTRAMUSCULAR | Status: AC | PRN
  Administered 2021-04-04: 1 mg via INTRAVENOUS
  Administered 2021-04-04: .5 mg via INTRAVENOUS

## 2021-04-04 MED ORDER — CALCIUM GLUCONATE-NACL 1-0.675 GM/50ML-% IV SOLN
1.0000 g | Freq: Once | INTRAVENOUS | Status: AC
  Administered 2021-04-04: 1000 mg via INTRAVENOUS
  Filled 2021-04-04 (×2): qty 50

## 2021-04-04 MED ORDER — SODIUM CHLORIDE 0.9% IV SOLUTION: Freq: Once | INTRAVENOUS | Status: DC

## 2021-04-04 MED ORDER — FENTANYL CITRATE (PF) 100 MCG/2ML IJ SOLN
INTRAMUSCULAR | Status: AC | PRN
  Administered 2021-04-04 (×2): 25 ug via INTRAVENOUS

## 2021-04-04 MED ORDER — SPOT INK MARKER SYRINGE KIT
PACK | SUBMUCOSAL | Status: DC | PRN
  Administered 2021-04-04: .5 mL via SUBMUCOSAL

## 2021-04-04 NOTE — Transfer of Care (Signed)
Immediate Anesthesia Transfer of Care Note  Patient: [name] [name]  Procedure(s) Performed: ENTEROSCOPY SUBMUCOSAL TATTOO INJECTION  Patient Location: PACU  Anesthesia Type:MAC  Level of Consciousness: drowsy  Airway & Oxygen Therapy: Patient Spontanous Breathing  Post-op Assessment: Report given to RN and Post -op Vital signs reviewed and stable  Post vital signs: Reviewed and stable  Last Vitals:  Vitals Value Taken Time  BP 124/64 04/04/21 0844  Temp    Pulse 75 04/04/21 0844  Resp 22 04/04/21 0844  SpO2 100 % 04/04/21 0844  Vitals shown include unvalidated device data.  Last Pain:  Vitals:   04/04/21 0757  TempSrc:   PainSc: 0-No pain         Complications: No notable events documented.

## 2021-04-04 NOTE — Op Note (Signed)
[facility] Patient Name: [name] [name] Procedure Date : 04/04/2021 MRN: [id number] Attending MD: Justice Britain , MD Date of Birth: July 31, 1955 CSN: [id number] Age: 65 Admit Type: Inpatient Procedure:                Small bowel enteroscopy Indications:              Abnormal video capsule endoscopy, Hematochezia,                            Recurrent gastrointestinal bleeding, Recurrent                            bleeding in the small bowel Providers:                Justice Britain, MD, Elmer Ramp. Tilden Dome, RN, Dietitian, [employer] Referring MD:             [facility] Medicines:                Monitored Anesthesia Care Complications:            No immediate complications. Estimated Blood Loss:     Estimated blood loss was minimal. Procedure:                Pre-Anesthesia Assessment:                           - Prior to the procedure, a History and Physical                            was performed, and patient medications and                            allergies were reviewed. The patient's tolerance of                            previous anesthesia was also reviewed. The risks                            and benefits of the procedure and the sedation                            options and risks were discussed with the patient.                            All questions were answered, and informed consent                            was obtained. Prior Anticoagulants: The patient has                            taken no previous anticoagulant or antiplatelet  agents. ASA Grade Assessment: II - A patient with                            mild systemic disease. After reviewing the risks                            and benefits, the patient was deemed in                            satisfactory condition to undergo the procedure.                           After obtaining informed consent, the endoscope was                             passed under direct vision. Throughout the                            procedure, the patient's blood pressure, pulse, and                            oxygen saturations were monitored continuously. The                            PCF-HQ190L ([id number] Olympus colonoscope was                            introduced through the mouth and advanced to the                            proximal jejunum. The small bowel enteroscopy was                            accomplished without difficulty. The patient                            tolerated the procedure. Scope In: Scope Out: Findings:      No gross mucosal lesions were noted in the entire esophagus.      A non-obstructing Schatzki ring was found at the gastroesophageal       junction.      The Z-line was regular and was found 40 cm from the incisors.      A 2 cm hiatal hernia was present.      Patchy moderate inflammation characterized by erosions, erythema,       friability and granularity was found in the entire examined stomach.       Biopsies were taken with a cold forceps for histology and Helicobacter       pylori testing.      Multiple non-bleeding cratered duodenal ulcers with a clean ulcer base       (Forrest Class III) were found in the duodenal bulb. The largest lesion       was 8 mm in largest dimension. Biopsies were taken with a cold forceps       for Helicobacter pylori testing.      A deformity was  found in the D1/D2 angle and was what probably did not       allow the capsule to traverse the small bowel for a significant amount       of time as noted on the VCE. Query if this could be post-ulcer related       scarring.      Normal mucosa was found in the second portion of the duodenum, in the       third portion of the duodenum and in the fourth portion of the duodenum.      Normal mucosa was found in the visualized proximal jejunum. Area was       tattooed with an injection of Spot (carbon black) to  demarcate the       distal extent of today's SBE. Impression:               - No gross lesions in esophagus. Non-obstructing                            Schatzki ring. Z-line regular, 40 cm from the                            incisors.                           - 2 cm hiatal hernia.                           - Gastritis. Biopsied.                           - Multiple non-bleeding duodenal ulcers with a                            clean ulcer base in the duodenal bulb region                            (Forrest Class III). Biopsied.                           - Duodenal deformity at the D1/D2 angle/sweep.                           - Normal mucosa was found in the second portion of                            the duodenum, in the third portion of the duodenum                            and in the fourth portion of the duodenum.                           - Normal mucosa was found in the proximal jejunum.                            Tattooed. Recommendation:           - The patient will  be observed post-procedure,                            until all discharge criteria are met.                           - Return patient to hospital ward for ongoing care.                           - Full liquid diet for now OK.                           - Protonix 40 mg BID.                           - Trend Hgb/Hct.                           - Consider repeat Tagged RBC vs CT-Angiography to                            see if we can evaluate this area in the small                            bowel. Will discuss with patient and potentially                            order today.                           - Unclear if IR would consider a Mesenteric                            angiogram without active extravasation, though it                            is clear he is still having stuttering bleeding.                            Query Provocative Angiography would be considered                            by IR pending  the above workup.                           - Try to initiate transfer to [facility] for                            Balloon-Assisted Enteroscopy. Question if this may                            be best through a retrograde approach since it is                            much more distal  in the small bowel, but antegrade                            approach could be considered as well.                           - The findings and recommendations were discussed                            with the patient.                           - The findings and recommendations were discussed                            with the patient's family.                           - The findings and recommendations were discussed                            with the referring physician. Procedure Code(s):        --- Professional ---                           [phone], Small intestinal endoscopy, enteroscopy                            beyond second portion of duodenum, not including                            ileum; with biopsy, single or multiple                           44799, Unlisted procedure, small intestine Diagnosis Code(s):        --- Professional ---                           K22.2, Esophageal obstruction                           K44.9, Diaphragmatic hernia without obstruction or                            gangrene                           K29.70, Gastritis, unspecified, without bleeding                           K26.9, Duodenal ulcer, unspecified as acute or                            chronic, without hemorrhage or perforation                           K31.89, Other diseases of stomach and  duodenum                           K92.1, Melena (includes Hematochezia)                           K92.2, Gastrointestinal hemorrhage, unspecified                           R93.3, Abnormal findings on diagnostic imaging of                            other parts of digestive tract CPT copyright 2019 American Medical  Association. All rights reserved. The codes documented in this report are preliminary and upon coder review may  be revised to meet current compliance requirements. Justice Britain, MD 04/04/2021 9:05:51 AM Number of Addenda: 0

## 2021-04-04 NOTE — Anesthesia Postprocedure Evaluation (Signed)
Anesthesia Post Note  Patient: [name] [name]  Procedure(s) Performed: ENTEROSCOPY SUBMUCOSAL TATTOO INJECTION BIOPSY     Patient location during evaluation: PACU Anesthesia Type: MAC Level of consciousness: awake and alert, awake and oriented Pain management: pain level controlled Vital Signs Assessment: post-procedure vital signs reviewed and stable Respiratory status: spontaneous breathing, nonlabored ventilation and respiratory function stable Cardiovascular status: stable and blood pressure returned to baseline Postop Assessment: no apparent nausea or vomiting Anesthetic complications: no   No notable events documented.  Last Vitals:  Vitals:   04/04/21 1450 04/04/21 1455  BP: (!) 141/61 (!) 150/66  Pulse: 88 90  Resp: 14 (!) 7  Temp:    SpO2: 98% 96%    Last Pain:  Vitals:   04/04/21 1445  TempSrc:   PainSc: 0-No pain                 Catalina Gravel

## 2021-04-04 NOTE — Anesthesia Preprocedure Evaluation (Signed)
Anesthesia Evaluation  Patient identified by MRN, date of birth, ID band Patient awake    Reviewed: Allergy & Precautions, NPO status , Patient's Chart, lab work & pertinent test results  Airway Mallampati: III  TM Distance: >3 FB Neck ROM: Full    Dental  (+) Teeth Intact, Dental Advisory Given   Pulmonary neg pulmonary ROS,    Pulmonary exam normal breath sounds clear to auscultation       Cardiovascular hypertension, Pt. on medications Normal cardiovascular exam Rhythm:Regular Rate:Normal     Neuro/Psych negative neurological ROS     GI/Hepatic Neg liver ROS, GERD  ,  Endo/Other  Obesity   Renal/GU negative Renal ROS     Musculoskeletal negative musculoskeletal ROS (+)   Abdominal   Peds  Hematology  (+) Blood dyscrasia (Thrombocytopenia), anemia ,   Anesthesia Other Findings Day of surgery medications reviewed with the patient.  Reproductive/Obstetrics                             Anesthesia Physical Anesthesia Plan  ASA: 2  Anesthesia Plan: MAC   Post-op Pain Management:    Induction: Intravenous  PONV Risk Score and Plan: 1 and Propofol infusion and Treatment may vary due to age or medical condition  Airway Management Planned: Nasal Cannula and Natural Airway  Additional Equipment:   Intra-op Plan:   Post-operative Plan:   Informed Consent: I have reviewed the patients History and Physical, chart, labs and discussed the procedure including the risks, benefits and alternatives for the proposed anesthesia with the patient or authorized representative who has indicated his/her understanding and acceptance.     Dental advisory given  Plan Discussed with: CRNA and Anesthesiologist  Anesthesia Plan Comments:         Anesthesia Quick Evaluation

## 2021-04-04 NOTE — Discharge Summary (Addendum)
Patient Demographics  [name] [name]   TKZ:[id number]  TDD:[id number]  is a 65 y.o. male  DOB - 15-Mar-1956  04/01/2021  Transfer Date 04/05/2021  Admitting Physician Thurnell Lose, MD  Outpatient Primary MD for the patient is Laurey Morale, MD    Place of Transfer [facility] Accepting MD Quintella Reichert [facility] ambulance Condition fair  Admission Diagnosis  Acute GI bleeding [K92.2] Anemia associated with acute blood loss [D62] Gastrointestinal hemorrhage, unspecified gastrointestinal hemorrhage type [K92.2]  Discharge Diagnosis    Small bowel GI bleed  Past Medical History:  Diagnosis Date   Ankle fracture    Cancer ([place]) 1974   testicular cancer-at age 53   Cataract    bilateral sx   GERD (gastroesophageal reflux disease)    hx of   Hx of colonic polyps    pt unsure,thinks this was done in 1982 in [place]. no reports in EPIC   Hyperlipidemia    on meds   Hypertension    on meds   Kidney stones    Patella fracture    Retinal detachment    OD   Retinal tear of right eye    Syncopal episodes 2008    Past Surgical History:  Procedure Laterality Date   AIR/FLUID EXCHANGE Right 06/16/2014   Procedure: AIR/FLUID EXCHANGE;  Surgeon: Hayden Pedro, MD;  Location: [place];  Service: Ophthalmology;  Laterality: Right;   BIOPSY  04/04/2021   Procedure: BIOPSY;  Surgeon: Rush Landmark Telford Nab., MD;  Location: [place];  Service: Gastroenterology;;   CATARACT EXTRACTION     CATARACT EXTRACTION  09/2014   rt eye//left 01/2015   CATARACT EXTRACTION, BILATERAL  2016   per Dr. Herbert Deaner    COLONOSCOPY  02/16/2020   per Dr. Hilarie Fredrickson, adenomatous polyps, repeat in 3 yrs   ENTEROSCOPY N/A 04/04/2021   Procedure: ENTEROSCOPY;  Surgeon: Mansouraty, Telford Nab., MD;  Location: [place];  Service: Gastroenterology;  Laterality: N/A;   EYE SURGERY     GAS  INSERTION Right 06/16/2014   Procedure: INSERTION OF GAS;  Surgeon: Hayden Pedro, MD;  Location: [place];  Service: Ophthalmology;  Laterality: Right;  [id number]   GIVENS CAPSULE STUDY N/A 04/02/2021   Procedure: GIVENS CAPSULE STUDY;  Surgeon: [name]., MD;  Location: [place];  Service: Gastroenterology;  Laterality: N/A;   HYDROCELE EXCISION     IR ANGIOGRAM VISCERAL SELECTIVE  04/04/2021   IR US GUIDE VASC ACCESS LEFT  04/04/2021   KNEE ARTHROSCOPY Right    x 2   LASER PHOTO ABLATION Right 06/16/2014   Procedure: LASER PHOTO ABLATION;  Surgeon: Hayden Pedro, MD;  Location: [place];  Service: Ophthalmology;  Laterality: Right;  Headscope laser and endolaser    ORCHIECTOMY     right testicle   REPAIR OF COMPLEX TRACTION RETINAL DETACHMENT Right 06/16/2014   Procedure: REPAIR OF COMPLEX TRACTION RETINAL DETACHMENT;  Surgeon: Hayden Pedro, MD;  Location: [facility];  Service: Ophthalmology;  Laterality: Right;   RETINAL DETACHMENT SURGERY  06/2014   SUBMUCOSAL TATTOO INJECTION  04/04/2021   Procedure: SUBMUCOSAL TATTOO INJECTION;  Surgeon: [name]., MD;  Location: [place];  Service: Gastroenterology;;   TONSILLECTOMY     VITRECTOMY Right 06/16/2014   WISDOM TOOTH EXTRACTION      Consults  GI and IR  Significant Tests    VCE - Small bowel GI bleed  NM GI Blood Loss  Result Date: 04/02/2021 CLINICAL DATA:  GI bleed; no definitive GI bleed identified on recent CT EXAM: NUCLEAR MEDICINE GASTROINTESTINAL BLEEDING SCAN TECHNIQUE: Sequential abdominal images were obtained following intravenous administration of Tc-13mlabeled red blood cells. RADIOPHARMACEUTICALS:  24.8 mCi Tc-923mertechnetate in-vitro labeled red cells. COMPARISON:  April 01, 2021 FINDINGS: There is expected radiotracer distribution in the vascular structures. No extravascular accumulation of radiotracer to suggest active GI bleed. IMPRESSION: No active GI bleed identified.  Electronically Signed   By: StValentino Saxon.D.   On: 04/02/2021 14:41   IR Angiogram Visceral Selective  Result Date: 04/04/2021 INDICATION: Concern for small bowel bleeding. Please perform mesenteric arteriogram potential embolization as indicated. EXAM: 1. ULTRASOUND GUIDANCE FOR ARTERIAL ACCESS 2. SELECTIVE SUPERIOR MESENTERIC ARTERIOGRAM COMPARISON:  CTA abdomen pelvis-earlier same day; 04/01/2021; nuclear medicine tagged RBC study-04/02/2021 MEDICATIONS: None ANESTHESIA/SEDATION: Moderate (conscious) sedation was employed during this procedure. A total of Versed 1.5 mg and Fentanyl 50 mcg was administered intravenously. Moderate Sedation Time: 28 minutes. The patient's level of consciousness and vital signs were monitored continuously by radiology nursing throughout the procedure under my direct supervision. CONTRAST:  100 cc Omnipaque 300 FLUOROSCOPY TIME:  1 minute, 42 seconds (14505Gy) COMPLICATIONS: None immediate. PROCEDURE: Informed consent was obtained from the patient following explanation of the procedure, risks, benefits and alternatives. All questions were addressed. A time out was performed prior to the initiation of the procedure. Maximal barrier sterile technique utilized including caps, mask, sterile gowns, sterile gloves, large sterile drape, hand hygiene, and chlorhexidine prep. Given the presence of significant vaso occlusive disease affecting the right external iliac artery, the decision was made to perform a left common femoral arterial approach. As such, the left femoral head was marked fluoroscopically. Under sterile conditions and local anesthesia, the right common femoral artery access was performed with a micropuncture needle. Under direct ultrasound guidance, the left common femoral was accessed with a micropuncture kit. An ultrasound image was saved for documentation purposes. This allowed for placement of a 5-French vascular sheath. A limited arteriogram was performed  through the side arm of the sheath confirming appropriate access within the left common femoral artery. Over a Bentson wire, a Mickelson catheter was advanced the caudal aspect of the thoracic aorta where was reformed, back bled and flushed. The Mickelson catheter was then utilized to select the superior mesenteric artery and a selective superior mesenteric arteriogram was performed. Multiple selective superior mesenteric arteriograms were performed in various obliquities. Images were reviewed and the procedure was terminated. All wires, catheters and sheaths were removed from the patient. Hemostasis was achieved at the right groin access site with manual compression. The patient tolerated the procedure well without immediate post procedural complication. FINDINGS: Selective superior mesenteric arteriogram demonstrates 3 separate ileocolic branches arising from the left side of the main trunk of the SMA. There is short-segment occlusion involving the caudal most division of the ileocolic branch with reconstitution via hypertrophied arcade collaterals. Confirmed on multiple oblique angiograms is an ill-defined area of hyperemia, potentially neovascularity, within the left lower abdomen, felt to correlate with the ill-defined area of hyperemia on preceding CTA. While predominantly supplied by the caudal most reconstituted ileocolic division, there is a lack of a definitive or dominant vessel supplying this ill-defined area of hyperemia, rather it appears to be supplied by markedly tortuous arterial collaterals. Multiple arteriograms were performed and while the area of ill-defined hyperemia persisted, there is no definitive intraluminal contrast extravasation to suggest ongoing GI bleeding. IMPRESSION: Persistent ill-defined area of hyperemia/potential neovascularity within the left lower  abdomen, likely correlating with the ill-defined area of hyperemia seen on preceding CTA, without discrete area of intraluminal  contrast extravasation. Findings are nonspecific though worrisome for the presence of an underlying small bowel tumor. The patient is a poor candidate for percutaneous embolization secondary to short-segment occlusion involving the caudal most ileocolic division as well as lack of a dominant arterial supply to the ill-defined area of hyperemia/potentially neovascularity, placing the patient at high risk for post procedure small bowel ischemia. PLAN: - The patient is to remain flat for 4 hours with right leg straight. - As findings may represent the presence of an underlying small bowel tumor, surgical consultation could be performed as indicated. Above was discussed with both Drs. Lewi Drost [facility]) and Mansouraty (GI) at the time of procedure completion. Electronically Signed   By: Sandi Mariscal M.D.   On: 04/04/2021 15:14   IR US Guide Vasc Access Left  Result Date: 04/04/2021 INDICATION: Concern for small bowel bleeding. Please perform mesenteric arteriogram potential embolization as indicated. EXAM: 1. ULTRASOUND GUIDANCE FOR ARTERIAL ACCESS 2. SELECTIVE SUPERIOR MESENTERIC ARTERIOGRAM COMPARISON:  CTA abdomen pelvis-earlier same day; 04/01/2021; nuclear medicine tagged RBC study-04/02/2021 MEDICATIONS: None ANESTHESIA/SEDATION: Moderate (conscious) sedation was employed during this procedure. A total of Versed 1.5 mg and Fentanyl 50 mcg was administered intravenously. Moderate Sedation Time: 28 minutes. The patient's level of consciousness and vital signs were monitored continuously by radiology nursing throughout the procedure under my direct supervision. CONTRAST:  100 cc Omnipaque 300 FLUOROSCOPY TIME:  1 minute, 42 seconds (161 mGy) COMPLICATIONS: None immediate. PROCEDURE: Informed consent was obtained from the patient following explanation of the procedure, risks, benefits and alternatives. All questions were addressed. A time out was performed prior to the initiation of the procedure. Maximal barrier sterile  technique utilized including caps, mask, sterile gowns, sterile gloves, large sterile drape, hand hygiene, and chlorhexidine prep. Given the presence of significant vaso occlusive disease affecting the right external iliac artery, the decision was made to perform a left common femoral arterial approach. As such, the left femoral head was marked fluoroscopically. Under sterile conditions and local anesthesia, the right common femoral artery access was performed with a micropuncture needle. Under direct ultrasound guidance, the left common femoral was accessed with a micropuncture kit. An ultrasound image was saved for documentation purposes. This allowed for placement of a 5-French vascular sheath. A limited arteriogram was performed through the side arm of the sheath confirming appropriate access within the left common femoral artery. Over a Bentson wire, a Mickelson catheter was advanced the caudal aspect of the thoracic aorta where was reformed, back bled and flushed. The Mickelson catheter was then utilized to select the superior mesenteric artery and a selective superior mesenteric arteriogram was performed. Multiple selective superior mesenteric arteriograms were performed in various obliquities. Images were reviewed and the procedure was terminated. All wires, catheters and sheaths were removed from the patient. Hemostasis was achieved at the right groin access site with manual compression. The patient tolerated the procedure well without immediate post procedural complication. FINDINGS: Selective superior mesenteric arteriogram demonstrates 3 separate ileocolic branches arising from the left side of the main trunk of the SMA. There is short-segment occlusion involving the caudal most division of the ileocolic branch with reconstitution via hypertrophied arcade collaterals. Confirmed on multiple oblique angiograms is an ill-defined area of hyperemia, potentially neovascularity, within the left lower abdomen,  felt to correlate with the ill-defined area of hyperemia on preceding CTA. While predominantly supplied by the caudal most reconstituted  ileocolic division, there is a lack of a definitive or dominant vessel supplying this ill-defined area of hyperemia, rather it appears to be supplied by markedly tortuous arterial collaterals. Multiple arteriograms were performed and while the area of ill-defined hyperemia persisted, there is no definitive intraluminal contrast extravasation to suggest ongoing GI bleeding. IMPRESSION: Persistent ill-defined area of hyperemia/potential neovascularity within the left lower abdomen, likely correlating with the ill-defined area of hyperemia seen on preceding CTA, without discrete area of intraluminal contrast extravasation. Findings are nonspecific though worrisome for the presence of an underlying small bowel tumor. The patient is a poor candidate for percutaneous embolization secondary to short-segment occlusion involving the caudal most ileocolic division as well as lack of a dominant arterial supply to the ill-defined area of hyperemia/potentially neovascularity, placing the patient at high risk for post procedure small bowel ischemia. PLAN: - The patient is to remain flat for 4 hours with right leg straight. - As findings may represent the presence of an underlying small bowel tumor, surgical consultation could be performed as indicated. Above was discussed with both Drs. Talayah Picardi [facility]) and Mansouraty (GI) at the time of procedure completion. Electronically Signed   By: Sandi Mariscal M.D.   On: 04/04/2021 15:14   OCT, Retina - OU - Both Eyes  Result Date: 03/21/2021 Right Eye Quality was good. Central Foveal Thickness: 304. Progression has been stable. Findings include no SRF, no IRF, normal foveal contour (No CME/IRF ). Left Eye Quality was good. Central Foveal Thickness: 277. Progression has been stable. Findings include normal foveal contour, no IRF, no SRF. Notes *Images  captured and stored on drive Diagnosis / Impression: OD: NFP; no CME/IRF/SRF OS: NFP, no IRF/SRF Clinical management: See below Abbreviations: NFP - Normal foveal profile. CME - cystoid macular edema. PED - pigment epithelial detachment. IRF - intraretinal fluid. SRF - subretinal fluid. EZ - ellipsoid zone. ERM - epiretinal membrane. ORA - outer retinal atrophy. ORT - outer retinal tubulation. SRHM - subretinal hyper-reflective material  CT ANGIO GI BLEED  Result Date: 04/04/2021 CLINICAL DATA:  Recent endoscopy with concern for small bowel bleeding. EXAM: CTA ABDOMEN AND PELVIS WITHOUT AND WITH CONTRAST TECHNIQUE: Multidetector CT imaging of the abdomen and pelvis was performed using the standard protocol during bolus administration of intravenous contrast. Multiplanar reconstructed images and MIPs were obtained and reviewed to evaluate the vascular anatomy. CONTRAST:  163m OMNIPAQUE IOHEXOL 350 MG/ML SOLN COMPARISON:  CT abdomen pelvis-04/01/2021; tagged nuclear medicine RBC bleeding study-04/02/2021 FINDINGS: VASCULAR Aorta: There is a large amount of calcified and noncalcified atherosclerotic plaque within a normal caliber abdominal aorta, not resulting in a hemodynamically significant stenosis. No evidence of abdominal aortic dissection or periaortic stranding. Celiac: There is a moderate to large amount of calcified atherosclerotic plaque involving the origin of the celiac artery, not resulting in a hemodynamically significant stenosis. Conventional branching pattern. SMA: There is a large amount of calcified and noncalcified atherosclerotic plaque involving the origin and main trunk of the SMA, not definitely resulting in a hemodynamically significant stenosis. There is a large amount of calcified atherosclerotic plaque involving the origin and proximal aspects of the main branches of the SMA likely resulting in tandem areas of hemodynamically significant narrowing though suboptimally evaluated due to  mural calcifications. Ill-defined area of hyperemia and potential intraluminal contrast extravasation involving the mid jejunum is likely supplied via one of the tertiary branches of the SMA. Renals: Right renal artery is duplicated, nearly co-dominant. There is unchanged atrophy involving the inferior pole of the  right kidney, likely vascular in etiology. There is a large amount of calcified atherosclerotic plaque involving the origin of all renal arteries, resulting at least 50% luminal narrowing bilaterally. There is unchanged atrophy involving the inferior pole of the right kidney, likely vascular in etiology. IMA: There is a suspected hemodynamically significant narrowing/subtotal occlusion involving the origin of the IMA with reconstitution distally, presumably from collateral supply from the SMA. Inflow: There is a moderate amount of calcified atherosclerotic plaque involving the bilateral common iliac arteries, not resulting in hemodynamically significant stenosis. There is a large amount of predominantly calcified atherosclerotic plaque involving the right external iliac artery approaching tandem areas of at least 50% luminal narrowing. The left external iliac artery is of normal caliber and widely patent without hemodynamically significant narrowing. Proximal Outflow: There is a moderate amount of eccentric predominantly calcified atherosclerotic plaque involving the right common femoral artery, not definitely resulting in hemodynamically significant stenosis. The left common femoral artery is of normal caliber and widely patent without hemodynamically significant narrowing. The imaged portions of the bilateral superficial and deep femoral arteries are of normal caliber and widely patent without hemodynamically significant narrowing. Veins: The IVC and pelvic venous systems appear widely patent. Review of the MIP images confirms the above findings. _________________________________________________________  NON-VASCULAR Lower chest: Limited visualization of the lower thorax is negative for focal airspace opacity or pleural effusion. Normal heart size.  No pericardial effusion. Hepatobiliary: Normal hepatic contour. No discrete hepatic lesions. Normal appearance of the gallbladder given degree distention. No radiopaque gallstones. No intra or extrahepatic biliary duct dilatation. No ascites. Pancreas: Normal appearance of the pancreas. Spleen: Normal appearance of the spleen. Adrenals/Urinary Tract: . Stomach/Bowel: There is an ill-defined area hyperemia and potential intraluminal contrast extravasation involving the mid jejunum within the left mid hemiabdomen (axial image 91, series 15; coronal image 67, series 13). No associated bowel wall thickening. No additional discrete areas of intraluminal contrast extravasation are identified. Moderate colonic stool burden without evidence of enteric obstruction. Endoscopy capsule is seen at the level of the distal sigmoid colon. Extensive colonic diverticulosis without evidence superimposed acute diverticulitis. Normal appearance of the terminal ileum and the appendix. Suspected approximally 0.8 cm intraluminal lipoma at the level of the cecum (image 99, series 15), similar to the 04/01/2021 examination. No pneumoperitoneum, pneumatosis or portal venous gas. Lymphatic: No bulky retroperitoneal, mesenteric, pelvic or inguinal lymphadenopathy. Reproductive: Normal appearance the prostate gland. No free fluid the pelvic cul-de-sac. Other: Asymmetric atrophy involving the right rectus abdominal musculature. Subcutaneous edema about the midline of the low back. Musculoskeletal: No acute or aggressive osseous abnormalities. There is partial lumbarization of the S1 vertebral body. Stigmata of dish within the thoracic spine. Moderate bilateral facet degenerative change the lower lumbar spine. Mild-to-moderate degenerative change the bilateral hips with joint space loss, subchondral  sclerosis and osteophytosis. IMPRESSION: VASCULAR 1. The examination is positive for hyperemia and potential intraluminal contrast extravasation involving the mid jejunum within the left mid hemiabdomen, likely supplied via tertiary branches from the SMA. The SMA is heavily diseased with suspected areas of hemodynamically significant narrowings involving its major branches and divisions. 2. Large amount of atherosclerotic plaque within normal caliber abdominal aorta, not resulting in a hemodynamically significant stenosis. 3. Suspected hemodynamically significant narrowings involving the bilateral renal arteries including geographic atrophy involving the inferior pole the right kidney, likely vascular in etiology. 4.  Aortic Atherosclerosis (ICD10-I70.0). NON-VASCULAR 1. Colonic diverticulosis without evidence superimposed acute diverticulitis. Electronically Signed   By: Eldridge Abrahams.D.  On: 04/04/2021 13:17   CT ANGIO GI BLEED  Result Date: 04/01/2021 CLINICAL DATA:  Lower GI bleed, blood per rectum EXAM: CTA ABDOMEN AND PELVIS WITHOUT AND WITH CONTRAST TECHNIQUE: Multidetector CT imaging of the abdomen and pelvis was performed using the standard protocol during bolus administration of intravenous contrast. Multiplanar reconstructed images and MIPs were obtained and reviewed to evaluate the vascular anatomy. CONTRAST:  147m OMNIPAQUE IOHEXOL 350 MG/ML SOLN COMPARISON:  CT 12/12/2019 and previous FINDINGS: VASCULAR Coronary calcifications. Aorta: Moderate calcified atheromatous plaque. No aneurysm, dissection, or stenosis. Celiac: Patent without evidence of aneurysm, dissection, vasculitis or significant stenosis. SMA: Heavily calcified plaque through the length of the SMA resulting in tandem areas of at least mild stenosis but no definite occlusion. Classic distal branch anatomy, with proximal atheromatous involvement. Renals: Single left, with heavily calcified ostial plaque resulting in short segment  stenosis of at least moderate severity, patent distally. Duplicated right, superior dominant with calcified ostial plaque resulting in short segment stenosis of at least mild severity, patent distally. IMA: Long segment occlusion from its origin. Inflow: Calcified plaque scattered throughout bilateral common iliac and internal iliac arteries without aneurysm or stenosis. Extensive heavily calcified plaque through the length of the right external iliac artery resulting in only mild stenosis. The left external iliac is curiously nearly disease-free. Proximal Outflow: Bilateral common femoral and visualized portions of the superficial and profunda femoral arteries are patent without evidence of aneurysm, dissection, vasculitis or significant stenosis. Veins: Patent hepatic veins, portal vein, bilateral renal veins, IVC, and iliac venous system. No venous pathology identified. Review of the MIP images confirms the above findings. NON-VASCULAR Lower chest: No pleural or pericardial effusion. Visualized lung bases clear. Hepatobiliary: No focal liver abnormality is seen. No gallstones, gallbladder wall thickening, or biliary dilatation. Pancreas: Unremarkable. No pancreatic ductal dilatation or surrounding inflammatory changes. Spleen: Normal in size without focal abnormality. Adrenals/Urinary Tract: Normal adrenal glands. Bilateral renal cysts. No hydronephrosis or urolithiasis. Urinary bladder is distended. Stomach/Bowel: Stomach is incompletely distended, unremarkable. Small bowel decompressed. Focal eccentric enhancement in a left mid abdominal small bowel loop (7: 84-85, 12: 55-56) with 2 regional punctate calcifications , but no continued contrast accumulation on the delayed scan to suggest acute hemorrhage. No discrete mass is evident, evaluation limited in the absence of oral contrast material. Normal appendix. The colon is nondilated. A few distal descending and sigmoid diverticula, without adjacent  inflammatory/edematous change. No evidence of active intraluminal hemorrhage. Lymphatic: No abdominal or pelvic adenopathy. Reproductive: Prostate enlargement. Other: No ascites.  No free air. Musculoskeletal: Facet DJD in the lower lumbar spine. No fracture or worrisome bone lesion. IMPRESSION: 1. No evidence of acute GI bleeding. 2. Focal region of eccentric arterial phase enhancement and small punctate calcifications in the mid small bowel. Mural/intraluminal lesion cannot be confidently excluded. CT enterography or enteroscopy may be useful for further characterization. 3. Extensive Aortic Atherosclerosis (ICD10-170.0), with atheromatous SMA and occluded IMA. Celiac axis remains widely patent. 4. Descending and sigmoid diverticulosis. Electronically Signed   By: DLucrezia EuropeM.D.   On: 04/01/2021 12:49    Hospital Course See H&P for all details in brief.  [name] WENINGERis a 65y.o. male with history of hypertension, massive GI bleeding [facility] which was inconclusive but potential culprit was thought to be diverticular bleed, presents to the ER with complaints of having passed blood per rectum   Acute  blood loss related anemia due to ongoing active ongoing Small Bowel GI bleed with orthostatic hypotension - he has had thorough work-up in August of this year at [facility] with potential culprit being diverticular but not completely clear.  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   Case discussed with [facility] transfer center, they have graciously accepted the patient in transfer of balloon enteroscopy and evaluation of the suspected small bowel lesions and bleeding site. In the meantime supportive care will be continued.    2.  Dyslipidemia.  Statin once taking oral diet.   3.  HTN.  As needed hydralazine.   Today   Subjective:   [name] [name] today has no headache,no chest abdominal pain,no new weakness tingling or numbness, still having some intermittent GI bleed  Objective:   Blood pressure (!) 141/61, pulse 85, temperature 98.9 F (37.2 C), temperature source Oral, resp. rate 20, height 5' 11"  (1.803 m), weight 104.3 kg, SpO2 98 %.  Intake/Output Summary (Last 24 hours) at 04/05/2021 1536 Last data filed at 04/05/2021 1342 Gross per 24 hour  Intake 2260 ml  Output 375 ml  Net 1885 ml    Exam Awake Alert, Oriented *3, No new F.N deficits, Normal affect [address].AT,PERRAL Supple Neck,No JVD, No cervical lymphadenopathy appriciated.  Symmetrical Chest wall movement, Good air movement bilaterally, CTAB RRR,No Gallops,Rubs or new Murmurs, No Parasternal Heave +ve B.Sounds, Abd Soft, Non tender, No organomegaly appriciated, No rebound -guarding or rigidity. No Cyanosis, Clubbing or edema, No new Rash or bruise, left groin arteriogram femoral access site under bandage and stable no hematoma  Data Review  CBC w Diff:  Lab Results  Component Value Date   WBC 7.6 04/05/2021   HGB 7.9 (L) 04/05/2021   HCT 22.9 (L) 04/05/2021   PLT 124 (L) 04/05/2021   LYMPHOPCT 13 04/04/2021   MONOPCT 10 04/04/2021    EOSPCT 2 04/04/2021   BASOPCT 1 04/04/2021   CMP:  Lab Results  Component Value Date   NA 138 04/05/2021   K 4.0 04/05/2021   CL 108 04/05/2021   CO2 26 04/05/2021   BUN 31 (H) 04/05/2021   CREATININE 1.11 04/05/2021   PROT 4.0 (L) 04/05/2021   PROT 6.3 03/16/2020   ALBUMIN 2.2 (L) 04/05/2021   ALBUMIN 4.3 03/16/2020   BILITOT 1.8 (H) 04/05/2021   BILITOT 0.8 03/16/2020   ALKPHOS 30 (L) 04/05/2021   AST 15 04/05/2021   ALT 11 04/05/2021  .  Home Medications  Prior to Admission medications   Medication Sig Start Date End Date Taking? Authorizing Provider  amLODipine (NORVASC) 5 MG tablet TAKE 1 TABLET BY MOUTH EVERY DAY Patient taking differently: Take 5 mg by mouth daily. 04/13/20  Yes Laurey Morale, MD  lisinopril-hydrochlorothiazide (ZESTORETIC) 20-12.5 MG tablet TAKE 1 TABLET BY MOUTH EVERY DAY Patient taking differently: Take 1 tablet by mouth daily. 04/13/20  Yes Laurey Morale, MD  rosuvastatin (CRESTOR) 40 MG tablet TAKE 1 TABLET BY MOUTH EVERY DAY Patient taking differently: Take 40 mg by mouth daily. 01/28/21  Yes Jettie Booze, MD  Iron, Ferrous Sulfate, 325 (65 Fe) MG TABS Take 325 mg by mouth daily. Patient not taking: Reported on 03/21/2021 01/07/21   Laurey Morale, MD    Inpatient Medications  Scheduled Meds:  sodium chloride   Intravenous Once   sodium chloride   Intravenous Once   furosemide  20 mg Intravenous Once   pantoprazole (PROTONIX) IV  40 mg Intravenous Q12H   Continuous Infusions:  lactated ringers 25 mL/hr at 04/05/21 0857   PRN Meds:.acetaminophen **OR** acetaminophen, hydrALAZINE   The risks and  benefits including possible death-disability of treating patient at this facility vs transporting the patinet to a higher level of care were discussed in detail with patient and his wife and the plan was acceptable to them.   Total Time in preparing paper work, todays exam and data evaluation 35 minutes  Lala Lund M.D on 04/05/2021  at 3:36 PM  [facility] Office  [phone]

## 2021-04-04 NOTE — Sedation Documentation (Signed)
Versed 0.5mg  IV and Fentanyl 6mcg IV wasted in sink witness by Dr. Pascal Lux.

## 2021-04-04 NOTE — Progress Notes (Signed)
PT Cancellation Note  Patient Details Name: [name] [name] MRN: [id number] DOB: [birth year]   Cancelled Treatment:    Reason Eval/Treat Not Completed: Patient at procedure or test/unavailable Pt going for emergent push enteroscopy this AM and last attempt states "not doing anything until all procedures are done or bleeding is under control" which it is not per notes. Will follow.   [name] 04/04/2021, 7:43 AM Marisa Severin, PT, DPT Acute Rehabilitation Services Pager [phone] Office [phone]

## 2021-04-04 NOTE — Interval H&P Note (Signed)
History and Physical Interval Note:  04/04/2021 7:31 AM  [name] [name]  has presented today for surgery, with the diagnosis of passing blood.  small bowel abnormality without active bleeding on CT angio.  The various methods of treatment have been discussed with the patient and family. After consideration of risks, benefits and other options for treatment, the patient has consented to  ENTEROSCOPY  as a surgical intervention.  The patient's history has been reviewed, patient examined, no change in status, stable for surgery.  I have reviewed the patient's chart and labs.  Questions were answered to the patient's satisfaction.     [employer]

## 2021-04-04 NOTE — Interval H&P Note (Signed)
History and Physical Interval Note:  04/04/2021 7:31 AM  [name] [name]  has presented today for surgery, with the diagnosis of passing blood.  small bowel abnormality without active bleeding on CT angio.  The various methods of treatment have been discussed with the patient and family. After consideration of risks, benefits and other options for treatment, the patient has consented to  Procedure(s): GIVENS CAPSULE STUDY (N/A) as a surgical intervention.  The patient's history has been reviewed, patient examined, no change in status, stable for surgery.  I have reviewed the patient's chart and labs.  Questions were answered to the patient's satisfaction.     [employer]

## 2021-04-04 NOTE — Procedures (Signed)
Pre-procedure Diagnosis: Concern for small bowel bleeding Post-procedure Diagnosis: Same  Post mesenteric arteriogram demonstrating a persistent ill-defined area of hyperemia/potential neovascularity within the left lower abdomen, likely correlating with the ill-defined area of hyperemia seen on preceding CTA, without discrete area of intraluminal contrast extravasation.    Findings are nonspecific though worrisome for the presence of an underlying small bowel tumor.   The patient is a poor candidate for percutaneous embolization secondary to short-segment occlusion involving the caudal most ileocolic division as well as lack of a dominant arterial supply to the ill-defined area of hyperemia/potentially neovascularity, placing the patient at high risk for post procedure small bowel ischemia.  Complications: None Immediate EBL: None  Keep right leg straight for 4 hrs (Until 1630)  PLAN:  - The patient is to remain flat for 4 hours with left leg straight.  - As findings may represent the presence of an underlying small bowel tumor, surgical consultation could be performed as indicated.  Above was discussed with both [facility]. [facility]) and [facility]ouraty (GI) at the time of procedure completion.     Signed: Sandi Mariscal Pager: [phone] 04/04/2021, 3:15 PM

## 2021-04-04 NOTE — Progress Notes (Signed)
I was able to reach out and speak with [facility] gastroenterology. I discussed the results of the video capsule endoscopy. They have excepted the patient for consideration of consultation when/if the patient receives a bed at [facility] for potential balloon enteroscopy. I suspect the possibility of a retrograde approach may be needed.  But antegrade approach would not be unreasonable either though I suspect this is much lower.  I ordered the patient a CT angiogram to see if we could find an active source of bleeding (CT angio abdomen/pelvis).  If a source of active bleeding is found, I would recommend that our interventional radiologist attempt more invasive angiography in an effort of trying to see if they can get to the area.  Having spoken with Dr. Pascal Lux, patient's SMA and IMA are not the most ideal of candidates for interventions which could increase chance for potential ischemia if interventions need to be performed, but the patient has had significant GI bleeding and I think it is reasonable to intervene if something is actively oozing.  I called and spoke with the patient and patient's wife and they also agree with increased risk for angiography, should the CTA be positive.  I have spoken with the medicine service in effort of patient being transferred to [facility] for retrograde double-balloon enteroscopy to try and reach the lesion in the distal small bowel.   Justice Britain, MD [facility] Advanced Endoscopy Office # [phone]

## 2021-04-04 NOTE — Consult Note (Signed)
Chief Complaint: Small bowel bleeding  Referring Physician(s): Mansouraty (GI) [facility])  Patient Status: [facility] - In-pt  History of Present Illness: [name] [name] is a 65 y.o. male with past history significant for HTN, hyperlipidemia, renal stones and remote history of testicular cancer who was referred for mesenteric arteriogram secondary to preceding upper endoscopy demonstrating active bleeding from the jejunum, not amenable to endoscopic approach given distal location.  Subsequent repeat CTA demonstrated an ill-defined area hyperemia and potential intraluminal contrast extravasation also within the jejunum.   Past Medical History:  Diagnosis Date   Ankle fracture    Cancer ([place]) 1974   testicular cancer-at age 88   Cataract    bilateral sx   GERD (gastroesophageal reflux disease)    hx of   Hx of colonic polyps    pt unsure,thinks this was done in 1982 in [place]. no reports in EPIC   Hyperlipidemia    on meds   Hypertension    on meds   Kidney stones    Patella fracture    Retinal detachment    OD   Retinal tear of right eye    Syncopal episodes 2008    Past Surgical History:  Procedure Laterality Date   AIR/FLUID EXCHANGE Right 06/16/2014   Procedure: AIR/FLUID EXCHANGE;  Surgeon: Hayden Pedro, MD;  Location: [place];  Service: Ophthalmology;  Laterality: Right;   CATARACT EXTRACTION     CATARACT EXTRACTION  09/2014   rt eye//left 01/2015   CATARACT EXTRACTION, BILATERAL  2016   per Dr. Herbert Deaner    COLONOSCOPY  02/16/2020   per Dr. Hilarie Fredrickson, adenomatous polyps, repeat in 3 yrs   EYE SURGERY     GAS INSERTION Right 06/16/2014   Procedure: INSERTION OF GAS;  Surgeon: Hayden Pedro, MD;  Location: [place];  Service: Ophthalmology;  Laterality: Right;  [id number]   HYDROCELE EXCISION     KNEE ARTHROSCOPY Right    x 2   LASER PHOTO ABLATION Right 06/16/2014   Procedure: LASER PHOTO ABLATION;  Surgeon: Hayden Pedro, MD;  Location: [place];  Service: Ophthalmology;   Laterality: Right;  Headscope laser and endolaser    ORCHIECTOMY     right testicle   REPAIR OF COMPLEX TRACTION RETINAL DETACHMENT Right 06/16/2014   Procedure: REPAIR OF COMPLEX TRACTION RETINAL DETACHMENT;  Surgeon: Hayden Pedro, MD;  Location: [place];  Service: Ophthalmology;  Laterality: Right;   RETINAL DETACHMENT SURGERY  06/2014   TONSILLECTOMY     VITRECTOMY Right 06/16/2014   WISDOM TOOTH EXTRACTION      Allergies: Patient has no known allergies.  Medications: Prior to Admission medications   Medication Sig Start Date End Date Taking? Authorizing Provider  amLODipine (NORVASC) 5 MG tablet TAKE 1 TABLET BY MOUTH EVERY DAY Patient taking differently: Take 5 mg by mouth daily. 04/13/20  Yes Laurey Morale, MD  lisinopril-hydrochlorothiazide (ZESTORETIC) 20-12.5 MG tablet TAKE 1 TABLET BY MOUTH EVERY DAY Patient taking differently: Take 1 tablet by mouth daily. 04/13/20  Yes Laurey Morale, MD  rosuvastatin (CRESTOR) 40 MG tablet TAKE 1 TABLET BY MOUTH EVERY DAY Patient taking differently: Take 40 mg by mouth daily. 01/28/21  Yes Jettie Booze, MD  Iron, Ferrous Sulfate, 325 (65 Fe) MG TABS Take 325 mg by mouth daily. Patient not taking: Reported on 03/21/2021 01/07/21   Laurey Morale, MD     Family History  Problem Relation Age of Onset   Parkinson's disease Mother  Arrhythmia Father    Coronary artery disease Other        fhx   Hypertension Other        fhx   Sudden death Other        fhx   Colon cancer Neg Hx    Colon polyps Neg Hx    Esophageal cancer Neg Hx    Rectal cancer Neg Hx    Stomach cancer Neg Hx     Social History   Socioeconomic History   Marital status: Married    Spouse name: Not on file   Number of children: Not on file   Years of education: Not on file   Highest education level: Not on file  Occupational History   Occupation: Solicitor: [employer]  Tobacco Use   Smoking status: Never   Smokeless  tobacco: Never  Vaping Use   Vaping Use: Never used  Substance and Sexual Activity   Alcohol use: Yes    Alcohol/week: 0.0 standard drinks    Comment: 2 beers monthly   Drug use: No   Sexual activity: Not on file  Other Topics Concern   Not on file  Social History Narrative   Not on file   Social Determinants of Health   Financial Resource Strain: Low Risk    Difficulty of Paying Living Expenses: Not hard at all  Food Insecurity: No Food Insecurity   Worried About Charity fundraiser in the Last Year: Never true   [place] in the Last Year: Never true  Transportation Needs: No Transportation Needs   Lack of Transportation (Medical): No   Lack of Transportation (Non-Medical): No  Physical Activity: Sufficiently Active   Days of Exercise per Week: 3 days   Minutes of Exercise per Session: 120 min  Stress: No Stress Concern Present   Feeling of Stress : Not at all  Social Connections: Moderately Isolated   Frequency of Communication with Friends and Family: More than three times a week   Frequency of Social Gatherings with Friends and Family: More than three times a week   Attends Religious Services: Never   Marine scientist or Organizations: No   Attends Music therapist: Never   Marital Status: Married    ECOG Status: 1 - Symptomatic but completely ambulatory  Review of Systems: A 12 point ROS discussed and pertinent positives are indicated in the HPI above.  All other systems are negative.  Review of Systems  Vital Signs: BP 137/72 (BP Location: Left Arm)    Pulse 74    Temp 97.6 F (36.4 C)    Resp 18    Ht 5\' 11"  (1.803 m)    Wt 104.3 kg    SpO2 100%    BMI 32.07 kg/m   Physical Exam  Imaging: NM GI Blood Loss  Result Date: 04/02/2021 CLINICAL DATA:  GI bleed; no definitive GI bleed identified on recent CT EXAM: NUCLEAR MEDICINE GASTROINTESTINAL BLEEDING SCAN TECHNIQUE: Sequential abdominal images were obtained following intravenous  administration of Tc-38m labeled red blood cells. RADIOPHARMACEUTICALS:  24.8 mCi Tc-37m pertechnetate in-vitro labeled red cells. COMPARISON:  April 01, 2021 FINDINGS: There is expected radiotracer distribution in the vascular structures. No extravascular accumulation of radiotracer to suggest active GI bleed. IMPRESSION: No active GI bleed identified. Electronically Signed   By: Valentino Saxon M.D.   On: 04/02/2021 14:41   OCT, Retina - OU - Both Eyes  Result Date:  03/21/2021 Right Eye Quality was good. Central Foveal Thickness: 304. Progression has been stable. Findings include no SRF, no IRF, normal foveal contour (No CME/IRF ). Left Eye Quality was good. Central Foveal Thickness: 277. Progression has been stable. Findings include normal foveal contour, no IRF, no SRF. Notes *Images captured and stored on drive Diagnosis / Impression: OD: NFP; no CME/IRF/SRF OS: NFP, no IRF/SRF Clinical management: See below Abbreviations: NFP - Normal foveal profile. CME - cystoid macular edema. PED - pigment epithelial detachment. IRF - intraretinal fluid. SRF - subretinal fluid. EZ - ellipsoid zone. ERM - epiretinal membrane. ORA - outer retinal atrophy. ORT - outer retinal tubulation. SRHM - subretinal hyper-reflective material  CT ANGIO GI BLEED  Result Date: 04/04/2021 CLINICAL DATA:  Recent endoscopy with concern for small bowel bleeding. EXAM: CTA ABDOMEN AND PELVIS WITHOUT AND WITH CONTRAST TECHNIQUE: Multidetector CT imaging of the abdomen and pelvis was performed using the standard protocol during bolus administration of intravenous contrast. Multiplanar reconstructed images and MIPs were obtained and reviewed to evaluate the vascular anatomy. CONTRAST:  189mL OMNIPAQUE IOHEXOL 350 MG/ML SOLN COMPARISON:  CT abdomen pelvis-04/01/2021; tagged nuclear medicine RBC bleeding study-04/02/2021 FINDINGS: VASCULAR Aorta: There is a large amount of calcified and noncalcified atherosclerotic plaque within a  normal caliber abdominal aorta, not resulting in a hemodynamically significant stenosis. No evidence of abdominal aortic dissection or periaortic stranding. Celiac: There is a moderate to large amount of calcified atherosclerotic plaque involving the origin of the celiac artery, not resulting in a hemodynamically significant stenosis. Conventional branching pattern. SMA: There is a large amount of calcified and noncalcified atherosclerotic plaque involving the origin and main trunk of the SMA, not definitely resulting in a hemodynamically significant stenosis. There is a large amount of calcified atherosclerotic plaque involving the origin and proximal aspects of the main branches of the SMA likely resulting in tandem areas of hemodynamically significant narrowing though suboptimally evaluated due to mural calcifications. Ill-defined area of hyperemia and potential intraluminal contrast extravasation involving the mid jejunum is likely supplied via one of the tertiary branches of the SMA. Renals: Right renal artery is duplicated, nearly co-dominant. There is unchanged atrophy involving the inferior pole of the right kidney, likely vascular in etiology. There is a large amount of calcified atherosclerotic plaque involving the origin of all renal arteries, resulting at least 50% luminal narrowing bilaterally. There is unchanged atrophy involving the inferior pole of the right kidney, likely vascular in etiology. IMA: There is a suspected hemodynamically significant narrowing/subtotal occlusion involving the origin of the IMA with reconstitution distally, presumably from collateral supply from the SMA. Inflow: There is a moderate amount of calcified atherosclerotic plaque involving the bilateral common iliac arteries, not resulting in hemodynamically significant stenosis. There is a large amount of predominantly calcified atherosclerotic plaque involving the right external iliac artery approaching tandem areas of at  least 50% luminal narrowing. The left external iliac artery is of normal caliber and widely patent without hemodynamically significant narrowing. Proximal Outflow: There is a moderate amount of eccentric predominantly calcified atherosclerotic plaque involving the right common femoral artery, not definitely resulting in hemodynamically significant stenosis. The left common femoral artery is of normal caliber and widely patent without hemodynamically significant narrowing. The imaged portions of the bilateral superficial and deep femoral arteries are of normal caliber and widely patent without hemodynamically significant narrowing. Veins: The IVC and pelvic venous systems appear widely patent. Review of the MIP images confirms the above findings. _________________________________________________________ NON-VASCULAR Lower chest: Limited visualization of  the lower thorax is negative for focal airspace opacity or pleural effusion. Normal heart size.  No pericardial effusion. Hepatobiliary: Normal hepatic contour. No discrete hepatic lesions. Normal appearance of the gallbladder given degree distention. No radiopaque gallstones. No intra or extrahepatic biliary duct dilatation. No ascites. Pancreas: Normal appearance of the pancreas. Spleen: Normal appearance of the spleen. Adrenals/Urinary Tract: . Stomach/Bowel: There is an ill-defined area hyperemia and potential intraluminal contrast extravasation involving the mid jejunum within the left mid hemiabdomen (axial image 91, series 15; coronal image 67, series 13). No associated bowel wall thickening. No additional discrete areas of intraluminal contrast extravasation are identified. Moderate colonic stool burden without evidence of enteric obstruction. Endoscopy capsule is seen at the level of the distal sigmoid colon. Extensive colonic diverticulosis without evidence superimposed acute diverticulitis. Normal appearance of the terminal ileum and the appendix. Suspected  approximally 0.8 cm intraluminal lipoma at the level of the cecum (image 99, series 15), similar to the 04/01/2021 examination. No pneumoperitoneum, pneumatosis or portal venous gas. Lymphatic: No bulky retroperitoneal, mesenteric, pelvic or inguinal lymphadenopathy. Reproductive: Normal appearance the prostate gland. No free fluid the pelvic cul-de-sac. Other: Asymmetric atrophy involving the right rectus abdominal musculature. Subcutaneous edema about the midline of the low back. Musculoskeletal: No acute or aggressive osseous abnormalities. There is partial lumbarization of the S1 vertebral body. Stigmata of dish within the thoracic spine. Moderate bilateral facet degenerative change the lower lumbar spine. Mild-to-moderate degenerative change the bilateral hips with joint space loss, subchondral sclerosis and osteophytosis. IMPRESSION: VASCULAR 1. The examination is positive for hyperemia and potential intraluminal contrast extravasation involving the mid jejunum within the left mid hemiabdomen, likely supplied via tertiary branches from the SMA. The SMA is heavily diseased with suspected areas of hemodynamically significant narrowings involving its major branches and divisions. 2. Large amount of atherosclerotic plaque within normal caliber abdominal aorta, not resulting in a hemodynamically significant stenosis. 3. Suspected hemodynamically significant narrowings involving the bilateral renal arteries including geographic atrophy involving the inferior pole the right kidney, likely vascular in etiology. 4.  Aortic Atherosclerosis (ICD10-I70.0). NON-VASCULAR 1. Colonic diverticulosis without evidence superimposed acute diverticulitis. Electronically Signed   By: Sandi Mariscal M.D.   On: 04/04/2021 13:17   CT ANGIO GI BLEED  Result Date: 04/01/2021 CLINICAL DATA:  Lower GI bleed, blood per rectum EXAM: CTA ABDOMEN AND PELVIS WITHOUT AND WITH CONTRAST TECHNIQUE: Multidetector CT imaging of the abdomen and  pelvis was performed using the standard protocol during bolus administration of intravenous contrast. Multiplanar reconstructed images and MIPs were obtained and reviewed to evaluate the vascular anatomy. CONTRAST:  154mL OMNIPAQUE IOHEXOL 350 MG/ML SOLN COMPARISON:  CT 12/12/2019 and previous FINDINGS: VASCULAR Coronary calcifications. Aorta: Moderate calcified atheromatous plaque. No aneurysm, dissection, or stenosis. Celiac: Patent without evidence of aneurysm, dissection, vasculitis or significant stenosis. SMA: Heavily calcified plaque through the length of the SMA resulting in tandem areas of at least mild stenosis but no definite occlusion. Classic distal branch anatomy, with proximal atheromatous involvement. Renals: Single left, with heavily calcified ostial plaque resulting in short segment stenosis of at least moderate severity, patent distally. Duplicated right, superior dominant with calcified ostial plaque resulting in short segment stenosis of at least mild severity, patent distally. IMA: Long segment occlusion from its origin. Inflow: Calcified plaque scattered throughout bilateral common iliac and internal iliac arteries without aneurysm or stenosis. Extensive heavily calcified plaque through the length of the right external iliac artery resulting in only mild stenosis. The left external iliac is curiously  nearly disease-free. Proximal Outflow: Bilateral common femoral and visualized portions of the superficial and profunda femoral arteries are patent without evidence of aneurysm, dissection, vasculitis or significant stenosis. Veins: Patent hepatic veins, portal vein, bilateral renal veins, IVC, and iliac venous system. No venous pathology identified. Review of the MIP images confirms the above findings. NON-VASCULAR Lower chest: No pleural or pericardial effusion. Visualized lung bases clear. Hepatobiliary: No focal liver abnormality is seen. No gallstones, gallbladder wall thickening, or biliary  dilatation. Pancreas: Unremarkable. No pancreatic ductal dilatation or surrounding inflammatory changes. Spleen: Normal in size without focal abnormality. Adrenals/Urinary Tract: Normal adrenal glands. Bilateral renal cysts. No hydronephrosis or urolithiasis. Urinary bladder is distended. Stomach/Bowel: Stomach is incompletely distended, unremarkable. Small bowel decompressed. Focal eccentric enhancement in a left mid abdominal small bowel loop (7: 84-85, 12: 55-56) with 2 regional punctate calcifications , but no continued contrast accumulation on the delayed scan to suggest acute hemorrhage. No discrete mass is evident, evaluation limited in the absence of oral contrast material. Normal appendix. The colon is nondilated. A few distal descending and sigmoid diverticula, without adjacent inflammatory/edematous change. No evidence of active intraluminal hemorrhage. Lymphatic: No abdominal or pelvic adenopathy. Reproductive: Prostate enlargement. Other: No ascites.  No free air. Musculoskeletal: Facet DJD in the lower lumbar spine. No fracture or worrisome bone lesion. IMPRESSION: 1. No evidence of acute GI bleeding. 2. Focal region of eccentric arterial phase enhancement and small punctate calcifications in the mid small bowel. Mural/intraluminal lesion cannot be confidently excluded. CT enterography or enteroscopy may be useful for further characterization. 3. Extensive Aortic Atherosclerosis (ICD10-170.0), with atheromatous SMA and occluded IMA. Celiac axis remains widely patent. 4. Descending and sigmoid diverticulosis. Electronically Signed   By: Lucrezia Europe M.D.   On: 04/01/2021 12:49    Labs:  CBC: Recent Labs    04/03/21 0526 04/03/21 1432 04/04/21 0236 04/04/21 1055  WBC 7.8 7.5 8.1 9.3  HGB 8.7* 7.2* 8.1* 7.5*  HCT 25.7* 22.1* 24.2* 22.1*  PLT 135* 129* 125* 130*    COAGS: No results for input(s): INR, APTT in the last 8760 hours.  BMP: Recent Labs    04/01/21 0134 04/02/21 0504  04/03/21 0526 04/04/21 0236  NA 136 136 137 137  K 4.2 4.1 4.3 4.0  CL 107 107 105 105  CO2 22 23 26 25   GLUCOSE 109* 146* 125* 111*  BUN 34* 23 24* 27*  CALCIUM 8.2* 8.1* 8.1* 7.5*  CREATININE 1.03 1.11 1.17 1.17  GFRNONAA >60 >60 >60 >60    LIVER FUNCTION TESTS: Recent Labs    04/01/21 0134 04/02/21 0504 04/03/21 0526 04/04/21 0236  BILITOT 0.7 0.8 1.5* 0.9  AST 25 15 14* 14*  ALT 16 13 11 11   ALKPHOS 41 37* 37* 30*  PROT 5.0* 4.7* 4.5* 4.0*  ALBUMIN 3.1* 2.8* 2.7* 2.3*    TUMOR MARKERS: No results for input(s): AFPTM, CEA, CA199, CHROMGRNA in the last 8760 hours.  Assessment and Plan:  [name] [name] is a 65 y.o. male with past history significant for HTN, hyperlipidemia, renal stones and remote history of testicular cancer who was referred for mesenteric arteriogram secondary to preceding upper endoscopy demonstrating active bleeding from the jejunum, not amenable to endoscopic approach given distal location.  Subsequent repeat CTA demonstrated an ill-defined area hyperemia and potential intraluminal contrast extravasation also within the jejunum.  Risks and benefits of mesenteric arteriogram with potential embolization were discussed with the patient including, but not limited to bleeding, infection, vascular injury or contrast  induced renal failure. Given the occlusion/subtotal occlusion of the IMA and the extensive amount of plaque within the SMA, I also specifically addressed the possibility of bowel ischemia.   All of the patient's questions were answered, patient is agreeable to proceed.  Consent signed and in chart.  Thank you for this interesting consult.  I greatly enjoyed meeting [name] [name] and look forward to participating in their care.  A copy of this report was sent to the requesting provider on this date.  Electronically Signed: Sandi Mariscal, MD 04/04/2021, 1:29 PM   I spent a total of 20 Minutes in face to face in clinical consultation, greater  than 50% of which was counseling/coordinating care for mesenteric arteriogram with possible intervention.

## 2021-04-04 NOTE — Progress Notes (Signed)
OT Cancellation Note  Patient Details Name: [name] [name] MRN: [id number] DOB: 21-Apr-1955   Cancelled Treatment:    Reason Eval/Treat Not Completed: Patient at procedure or test/ unavailable  [facility] 04/04/2021, 8:39 AM Maurie Boettcher, OT/L   Acute OT Clinical Specialist Acute Rehabilitation Services Pager [phone] Office [phone]

## 2021-04-04 NOTE — Progress Notes (Signed)
Seen                                                                   PROGRESS NOTE                                                                                                                                                                                                             Patient Demographics:    [name] [name], is a 65 y.o. male, DOB - 1955/07/29, WJX:[id number]  Outpatient Primary MD for the patient is Laurey Morale, MD    LOS - 3  Admit date - 04/01/2021    Chief Complaint  Patient presents with   GI Problem    Blood in stool       Brief Narrative (HPI from H&P)  [name] [name] is a 65 y.o. male with history of hypertension, massive GI bleeding requiring 10 units of packed RBC transfusion at [facility] in August of this year with thorough work-up which was inconclusive but potential culprit was thought to be diverticular bleed, presents to the ER with complaints of having passed blood per rectum.  Patient states last night around 11:00 patient went to the bathroom and had a black tarry bowel movement following which patient felt faint and had to lay on the floor.  Then following which patient had another bowel movement which was frankly bloody.  Was large quantity.  He came to the ER with ongoing lower GI bleed, orthostatic hypotension and near syncope.  Was admitted for lower GI bleed   Subjective:   Patient in bed, appears comfortable, denies any headache, no fever, no chest pain or pressure, no shortness of breath , no abdominal pain. No new focal weakness.  Still some blood in his stool intermittently.   Assessment  & Plan :     Acute blood loss related anemia due to ongoing active lower GI bleed with orthostatic hypotension - he has had thorough work-up in August of this year at [facility] with potential culprit being diverticular but not completely clear.  He has received 4 units of packed RBC transfusion so far at this facility, continue to monitor H&H  closely, TA and tagged RBC scan inconclusive, capsule endoscopy revealed possible duodenal bleed, GI on board continue to  monitor H&H may require further transfusion today, may require transfer to [facility] per GI, will discuss with GI later today.  2.  Dyslipidemia.  Statin once taking oral diet.  3.  HTN.  As needed hydralazine.        Condition -  Guarded  Family Communication  :   Wife Rodena Piety [phone] on 04/01/2021  Code Status :  Full  Consults  :  GI, IR  PUD Prophylaxis : PPI   Procedures  :     VCE -   - No gross lesions in esophagus. Non-obstructing Schatzki ring. Z-line regular, 40 cm from the incisors. - 2 cm hiatal hernia. - Gastritis. Biopsied. - Multiple non-bleeding duodenal ulcers with a clean ulcer base in the duodenal bulb region (Forrest Class III). Biopsied. - Duodenal deformity at the D1/D2 angle/sweep. - Normal mucosa was found in the second portion of the duodenum, in the third portion of the duodenum and in the fourth portion of the duodenum. - Normal mucosa was found in the proximal jejunum. Tattooed.  Tagged RBC - Negative  CTA - . No evidence of acute GI bleeding. 2. Focal region of eccentric arterial phase enhancement and small punctate calcifications in the mid small bowel. Mural/intraluminal lesion cannot be confidently excluded. CT enterography or enteroscopy may be useful for further characterization. 3. Extensive Aortic Atherosclerosis (ICD10-170.0), with atheromatous SMA and occluded IMA. Celiac axis remains widely patent. 4. Descending and sigmoid diverticulosis.       Disposition Plan  :    Status is inpatient  DVT Prophylaxis  :    Place TED hose Start: 04/01/21 0918 SCDs Start: 04/01/21 0501   Lab Results  Component Value Date   PLT 125 (L) 04/04/2021    Diet :  Diet Order             Diet full liquid Room service appropriate? Yes; Fluid consistency: Thin  Diet effective now                    Inpatient  Medications  Scheduled Meds: Continuous Infusions:  lactated ringers 50 mL/hr at 04/04/21 0514    PRN Meds:.[MAR Hold] acetaminophen **OR** [MAR Hold] acetaminophen, [MAR Hold] hydrALAZINE  Antibiotics  :    Anti-infectives (From admission, onward)    None        Time Spent in minutes  30   Lala Lund M.D on 04/04/2021 at 9:23 AM  To page go to [email or web address]   [facility] -  Office  [phone]  See all Orders from today for further details    Objective:   Vitals:   04/04/21 0648 04/04/21 0757 04/04/21 0845 04/04/21 0900  BP: (!) 151/68 (!) 167/67 124/64 120/74  Pulse: 70 89 75 73  Resp: 16 (!) 22 (!) 22 20  Temp:   97.6 F (36.4 C)   TempSrc:      SpO2: 99% 100% 100% 100%  Weight:  104.3 kg    Height:  5\' 11"  (1.803 m)      Wt Readings from Last 3 Encounters:  04/04/21 104.3 kg  12/01/20 104.3 kg  10/07/20 103.9 kg     Intake/Output Summary (Last 24 hours) at 04/04/2021 0923 Last data filed at 04/04/2021 0004 Gross per 24 hour  Intake 1092.5 ml  Output 375 ml  Net 717.5 ml     Physical Exam  Awake Alert, No new F.N deficits, Normal affect [place].AT,PERRAL Supple Neck, No JVD,   Symmetrical Chest wall movement, Good air  movement bilaterally, CTAB RRR,No Gallops, Rubs or new Murmurs,  +ve B.Sounds, Abd Soft, No tenderness,   No Cyanosis, Clubbing or edema      Data Review:    CBC Recent Labs  Lab 04/01/21 0134 04/01/21 0536 04/02/21 1007 04/02/21 1842 04/03/21 0526 04/03/21 1432 04/04/21 0236  WBC 7.5   < > 6.5 8.0 7.8 7.5 8.1  HGB 11.6*   < > 9.0* 7.6* 8.7* 7.2* 8.1*  HCT 36.8*   < > 26.9* 23.5* 25.7* 22.1* 24.2*  PLT 155   < > 167 157 135* 129* 125*  MCV 79.0*   < > 77.1* 77.8* 79.8* 80.4 81.8  MCH 24.9*   < > 25.8* 25.2* 27.0 26.2 27.4  MCHC 31.5   < > 33.5 32.3 33.9 32.6 33.5  RDW 17.3*   < > 17.6* 17.8* 17.1* 17.3* 16.7*  LYMPHSABS 0.7  --   --   --  1.0  --  1.1  MONOABS 0.6  --   --   --  0.8  --  0.8  EOSABS  0.1  --   --   --  0.1  --  0.2  BASOSABS 0.1  --   --   --  0.1  --  0.1   < > = values in this interval not displayed.    Electrolytes Recent Labs  Lab 04/01/21 0134 04/01/21 0140 04/02/21 0504 04/03/21 0526 04/04/21 0236  NA 136  --  136 137 137  K 4.2  --  4.1 4.3 4.0  CL 107  --  107 105 105  CO2 22  --  23 26 25   GLUCOSE 109*  --  146* 125* 111*  BUN 34*  --  23 24* 27*  CREATININE 1.03  --  1.11 1.17 1.17  CALCIUM 8.2*  --  8.1* 8.1* 7.5*  AST 25  --  15 14* 14*  ALT 16  --  13 11 11   ALKPHOS 41  --  37* 37* 30*  BILITOT 0.7  --  0.8 1.5* 0.9  ALBUMIN 3.1*  --  2.8* 2.7* 2.3*  MG  --   --  1.8 1.9 1.9  LATICACIDVEN  --  1.1  --   --   --   BNP  --   --  7.9 8.5 11.0    ------------------------------------------------------------------------------------------------------------------ No results for input(s): CHOL, HDL, LDLCALC, TRIG, CHOLHDL, LDLDIRECT in the last 72 hours.  Lab Results  Component Value Date   HGBA1C 5.3 10/07/2020    No results for input(s): TSH, T4TOTAL, T3FREE, THYROIDAB in the last 72 hours.  Invalid input(s): FREET3 ------------------------------------------------------------------------------------------------------------------ ID Labs Recent Labs  Lab 04/01/21 0134 04/01/21 0140 04/01/21 0536 04/02/21 0504 04/02/21 1007 04/02/21 1842 04/03/21 0526 04/03/21 1432 04/04/21 0236  WBC 7.5  --    < > 5.5 6.5 8.0 7.8 7.5 8.1  PLT 155  --    < > 146* 167 157 135* 129* 125*  LATICACIDVEN  --  1.1  --   --   --   --   --   --   --   CREATININE 1.03  --   --  1.11  --   --  1.17  --  1.17   < > = values in this interval not displayed.   Cardiac Enzymes No results for input(s): CKMB, TROPONINI, MYOGLOBIN in the last 168 hours.  Invalid input(s): CK   Radiology Reports NM GI Blood Loss  Result Date: 04/02/2021 CLINICAL  DATA:  GI bleed; no definitive GI bleed identified on recent CT EXAM: NUCLEAR MEDICINE GASTROINTESTINAL BLEEDING  SCAN TECHNIQUE: Sequential abdominal images were obtained following intravenous administration of Tc-79m labeled red blood cells. RADIOPHARMACEUTICALS:  24.8 mCi Tc-10m pertechnetate in-vitro labeled red cells. COMPARISON:  April 01, 2021 FINDINGS: There is expected radiotracer distribution in the vascular structures. No extravascular accumulation of radiotracer to suggest active GI bleed. IMPRESSION: No active GI bleed identified. Electronically Signed   By: Valentino Saxon M.D.   On: 04/02/2021 14:41   CT ANGIO GI BLEED  Result Date: 04/01/2021 CLINICAL DATA:  Lower GI bleed, blood per rectum EXAM: CTA ABDOMEN AND PELVIS WITHOUT AND WITH CONTRAST TECHNIQUE: Multidetector CT imaging of the abdomen and pelvis was performed using the standard protocol during bolus administration of intravenous contrast. Multiplanar reconstructed images and MIPs were obtained and reviewed to evaluate the vascular anatomy. CONTRAST:  136mL OMNIPAQUE IOHEXOL 350 MG/ML SOLN COMPARISON:  CT 12/12/2019 and previous FINDINGS: VASCULAR Coronary calcifications. Aorta: Moderate calcified atheromatous plaque. No aneurysm, dissection, or stenosis. Celiac: Patent without evidence of aneurysm, dissection, vasculitis or significant stenosis. SMA: Heavily calcified plaque through the length of the SMA resulting in tandem areas of at least mild stenosis but no definite occlusion. Classic distal branch anatomy, with proximal atheromatous involvement. Renals: Single left, with heavily calcified ostial plaque resulting in short segment stenosis of at least moderate severity, patent distally. Duplicated right, superior dominant with calcified ostial plaque resulting in short segment stenosis of at least mild severity, patent distally. IMA: Long segment occlusion from its origin. Inflow: Calcified plaque scattered throughout bilateral common iliac and internal iliac arteries without aneurysm or stenosis. Extensive heavily calcified plaque  through the length of the right external iliac artery resulting in only mild stenosis. The left external iliac is curiously nearly disease-free. Proximal Outflow: Bilateral common femoral and visualized portions of the superficial and profunda femoral arteries are patent without evidence of aneurysm, dissection, vasculitis or significant stenosis. Veins: Patent hepatic veins, portal vein, bilateral renal veins, IVC, and iliac venous system. No venous pathology identified. Review of the MIP images confirms the above findings. NON-VASCULAR Lower chest: No pleural or pericardial effusion. Visualized lung bases clear. Hepatobiliary: No focal liver abnormality is seen. No gallstones, gallbladder wall thickening, or biliary dilatation. Pancreas: Unremarkable. No pancreatic ductal dilatation or surrounding inflammatory changes. Spleen: Normal in size without focal abnormality. Adrenals/Urinary Tract: Normal adrenal glands. Bilateral renal cysts. No hydronephrosis or urolithiasis. Urinary bladder is distended. Stomach/Bowel: Stomach is incompletely distended, unremarkable. Small bowel decompressed. Focal eccentric enhancement in a left mid abdominal small bowel loop (7: 84-85, 12: 55-56) with 2 regional punctate calcifications , but no continued contrast accumulation on the delayed scan to suggest acute hemorrhage. No discrete mass is evident, evaluation limited in the absence of oral contrast material. Normal appendix. The colon is nondilated. A few distal descending and sigmoid diverticula, without adjacent inflammatory/edematous change. No evidence of active intraluminal hemorrhage. Lymphatic: No abdominal or pelvic adenopathy. Reproductive: Prostate enlargement. Other: No ascites.  No free air. Musculoskeletal: Facet DJD in the lower lumbar spine. No fracture or worrisome bone lesion. IMPRESSION: 1. No evidence of acute GI bleeding. 2. Focal region of eccentric arterial phase enhancement and small punctate  calcifications in the mid small bowel. Mural/intraluminal lesion cannot be confidently excluded. CT enterography or enteroscopy may be useful for further characterization. 3. Extensive Aortic Atherosclerosis (ICD10-170.0), with atheromatous SMA and occluded IMA. Celiac axis remains widely patent. 4. Descending and sigmoid diverticulosis. Electronically Signed  By: Lucrezia Europe M.D.   On: 04/01/2021 12:49

## 2021-04-05 DIAGNOSIS — K921 Melena: Secondary | ICD-10-CM

## 2021-04-05 DIAGNOSIS — R933 Abnormal findings on diagnostic imaging of other parts of digestive tract: Secondary | ICD-10-CM

## 2021-04-05 LAB — CBC
HCT: 22.9 % — ABNORMAL LOW (ref 39.0–52.0)
Hemoglobin: 7.9 g/dL — ABNORMAL LOW (ref 13.0–17.0)
MCH: 28.4 pg (ref 26.0–34.0)
MCHC: 34.5 g/dL (ref 30.0–36.0)
MCV: 82.4 fL (ref 80.0–100.0)
Platelets: 124 10*3/uL — ABNORMAL LOW (ref 150–400)
RBC: 2.78 MIL/uL — ABNORMAL LOW (ref 4.22–5.81)
RDW: 16.6 % — ABNORMAL HIGH (ref 11.5–15.5)
WBC: 7.6 10*3/uL (ref 4.0–10.5)
nRBC: 0.7 % — ABNORMAL HIGH (ref 0.0–0.2)

## 2021-04-05 LAB — COMPREHENSIVE METABOLIC PANEL
ALT: 11 U/L (ref 0–44)
AST: 15 U/L (ref 15–41)
Albumin: 2.2 g/dL — ABNORMAL LOW (ref 3.5–5.0)
Alkaline Phosphatase: 30 U/L — ABNORMAL LOW (ref 38–126)
Anion gap: 4 — ABNORMAL LOW (ref 5–15)
BUN: 31 mg/dL — ABNORMAL HIGH (ref 8–23)
CO2: 26 mmol/L (ref 22–32)
Calcium: 7.7 mg/dL — ABNORMAL LOW (ref 8.9–10.3)
Chloride: 108 mmol/L (ref 98–111)
Creatinine, Ser: 1.11 mg/dL (ref 0.61–1.24)
GFR, Estimated: >60 mL/min (ref >60)
Glucose, Bld: 111 mg/dL — ABNORMAL HIGH (ref 70–99)
Potassium: 4 mmol/L (ref 3.5–5.1)
Sodium: 138 mmol/L (ref 135–145)
Total Bilirubin: 1.8 mg/dL — ABNORMAL HIGH (ref 0.3–1.2)
Total Protein: 4 g/dL — ABNORMAL LOW (ref 6.5–8.1)

## 2021-04-05 LAB — RESP PANEL BY RT-PCR (FLU A&B, COVID) ARPGX2
Influenza A by PCR: NEGATIVE
Influenza B by PCR: NEGATIVE
SARS Coronavirus 2 by RT PCR: NEGATIVE

## 2021-04-05 LAB — CBC WITH DIFFERENTIAL/PLATELET
Abs Immature Granulocytes: 0.21 10*3/uL — ABNORMAL HIGH (ref 0.00–0.07)
Basophils Absolute: 0 10*3/uL (ref 0.0–0.1)
Basophils Relative: 0 %
Eosinophils Absolute: 0.2 10*3/uL (ref 0.0–0.5)
Eosinophils Relative: 2 %
HCT: 21.6 % — ABNORMAL LOW (ref 39.0–52.0)
Hemoglobin: 7.6 g/dL — ABNORMAL LOW (ref 13.0–17.0)
Immature Granulocytes: 3 %
Lymphocytes Relative: 11 %
Lymphs Abs: 0.8 10*3/uL (ref 0.7–4.0)
MCH: 29.1 pg (ref 26.0–34.0)
MCHC: 35.2 g/dL (ref 30.0–36.0)
MCV: 82.8 fL (ref 80.0–100.0)
Monocytes Absolute: 0.8 10*3/uL (ref 0.1–1.0)
Monocytes Relative: 11 %
Neutro Abs: 5.3 10*3/uL (ref 1.7–7.7)
Neutrophils Relative %: 73 %
Platelets: 145 10*3/uL — ABNORMAL LOW (ref 150–400)
RBC: 2.61 MIL/uL — ABNORMAL LOW (ref 4.22–5.81)
RDW: 17 % — ABNORMAL HIGH (ref 11.5–15.5)
WBC: 7.2 10*3/uL (ref 4.0–10.5)
nRBC: 0.7 % — ABNORMAL HIGH (ref 0.0–0.2)

## 2021-04-05 LAB — GLUCOSE, CAPILLARY
Glucose-Capillary: 122 mg/dL — ABNORMAL HIGH (ref 70–99)
Glucose-Capillary: 107 mg/dL — ABNORMAL HIGH (ref 70–99)
Glucose-Capillary: 98 mg/dL (ref 70–99)

## 2021-04-05 LAB — MAGNESIUM: Magnesium: 1.9 mg/dL (ref 1.7–2.4)

## 2021-04-05 LAB — PREPARE RBC (CROSSMATCH)

## 2021-04-05 LAB — BRAIN NATRIURETIC PEPTIDE: B Natriuretic Peptide: 13.8 pg/mL (ref 0.0–100.0)

## 2021-04-05 MED ORDER — SODIUM CHLORIDE 0.9% IV SOLUTION: Freq: Once | INTRAVENOUS | Status: DC

## 2021-04-05 MED ORDER — FUROSEMIDE 10 MG/ML IJ SOLN: 20.0000 mg | Freq: Once | INTRAMUSCULAR | Status: DC

## 2021-04-05 MED ORDER — PANTOPRAZOLE SODIUM 40 MG IV SOLR
40.0000 mg | Freq: Two times a day (BID) | INTRAVENOUS | Status: DC
  Administered 2021-04-05: 13:00:00 40 mg via INTRAVENOUS
  Filled 2021-04-05: qty 40

## 2021-04-05 MED ORDER — PANTOPRAZOLE SODIUM 40 MG PO TBEC: 40.0000 mg | DELAYED_RELEASE_TABLET | Freq: Two times a day (BID) | ORAL | Status: DC

## 2021-04-05 MED ORDER — LACTATED RINGERS IV SOLN: INTRAVENOUS | Status: DC

## 2021-04-05 MED ORDER — SODIUM CHLORIDE 0.9% IV SOLUTION
Freq: Once | INTRAVENOUS | Status: AC
  Administered 2021-04-05: 10:00:00 1000 mL via INTRAVENOUS

## 2021-04-05 NOTE — Telephone Encounter (Signed)
On 04/05/21 patient currently in hospital.

## 2021-04-05 NOTE — Progress Notes (Addendum)
Daily Rounding Note  04/05/2021, 1:06 PM  LOS: 4 days   SUBJECTIVE:   Chief complaint:  hematochezia.  Blood loss anemia    Last episode of passing blood was yesterday AM.  Gets dizzy w positional changes.  On full liquids, not very hungry.  No abd pain.   BP and pulses stable.    OBJECTIVE:         Vital signs in last 24 hours:    Temp:  [98.4 F (36.9 C)-99.5 F (37.5 C)] 99 F (37.2 C) (12/27 1123) Pulse Rate:  [75-101] 76 (12/27 1123) Resp:  [0-25] 20 (12/27 1123) BP: (118-165)/(60-82) 150/60 (12/27 1123) SpO2:  [92 %-100 %] 100 % (12/27 1123) Last BM Date: 04/04/21 Filed Weights   04/01/21 0051 04/04/21 0757  Weight: 104.3 kg 104.3 kg   General: looks well   Heart: RRR Chest: clear bil.   Abdomen: soft.  NT, ND.  Active BS  Extremities: no CCE Neuro/Psych:  calm, alert.  Fully oriented.  No focal deficits.  No tremors.    Intake/Output from previous day: 12/26 0701 - 12/27 0700 In: 1445 [I.V.:500; Blood:945] Out: 375 [Urine:375]  Intake/Output this shift: Total I/O In: 565 [I.V.:250; Blood:315] Out: -   Lab Results: Recent Labs    04/04/21 0236 04/04/21 1055 04/05/21 0627  WBC 8.1 9.3 7.6  HGB 8.1* 7.5* 7.9*  HCT 24.2* 22.1* 22.9*  PLT 125* 130* 124*   BMET Recent Labs    04/03/21 0526 04/04/21 0236 04/05/21 0627  NA 137 137 138  K 4.3 4.0 4.0  CL 105 105 108  CO2 26 25 26   GLUCOSE 125* 111* 111*  BUN 24* 27* 31*  CREATININE 1.17 1.17 1.11  CALCIUM 8.1* 7.5* 7.7*   LFT Recent Labs    04/03/21 0526 04/04/21 0236 04/05/21 0627  PROT 4.5* 4.0* 4.0*  ALBUMIN 2.7* 2.3* 2.2*  AST 14* 14* 15  ALT 11 11 11   ALKPHOS 37* 30* 30*  BILITOT 1.5* 0.9 1.8*    Studies/Results: IR Angiogram Visceral Selective  Result Date: 04/04/2021 INDICATION: Concern for small bowel bleeding. Please perform mesenteric arteriogram potential embolization as indicated. EXAM: 1. ULTRASOUND GUIDANCE  FOR ARTERIAL ACCESS 2. SELECTIVE SUPERIOR MESENTERIC ARTERIOGRAM COMPARISON:  CTA abdomen pelvis-earlier same day; 04/01/2021; nuclear medicine tagged RBC study-04/02/2021 MEDICATIONS: None ANESTHESIA/SEDATION: Moderate (conscious) sedation was employed during this procedure. A total of Versed 1.5 mg and Fentanyl 50 mcg was administered intravenously. Moderate Sedation Time: 28 minutes. The patient's level of consciousness and vital signs were monitored continuously by radiology nursing throughout the procedure under my direct supervision. CONTRAST:  100 cc Omnipaque 300 FLUOROSCOPY TIME:  1 minute, 42 seconds (876 mGy) COMPLICATIONS: None immediate. PROCEDURE: Informed consent was obtained from the patient following explanation of the procedure, risks, benefits and alternatives. All questions were addressed. A time out was performed prior to the initiation of the procedure. Maximal barrier sterile technique utilized including caps, mask, sterile gowns, sterile gloves, large sterile drape, hand hygiene, and chlorhexidine prep. Given the presence of significant vaso occlusive disease affecting the right external iliac artery, the decision was made to perform a left common femoral arterial approach. As such, the left femoral head was marked fluoroscopically. Under sterile conditions and local anesthesia, the right common femoral artery access was performed with a micropuncture needle. Under direct ultrasound guidance, the left common femoral was accessed with a micropuncture kit. An ultrasound image was saved for documentation purposes. This  allowed for placement of a 5-French vascular sheath. A limited arteriogram was performed through the side arm of the sheath confirming appropriate access within the left common femoral artery. Over a Bentson wire, a Mickelson catheter was advanced the caudal aspect of the thoracic aorta where was reformed, back bled and flushed. The Mickelson catheter was then utilized to select  the superior mesenteric artery and a selective superior mesenteric arteriogram was performed. Multiple selective superior mesenteric arteriograms were performed in various obliquities. Images were reviewed and the procedure was terminated. All wires, catheters and sheaths were removed from the patient. Hemostasis was achieved at the right groin access site with manual compression. The patient tolerated the procedure well without immediate post procedural complication. FINDINGS: Selective superior mesenteric arteriogram demonstrates 3 separate ileocolic branches arising from the left side of the main trunk of the SMA. There is short-segment occlusion involving the caudal most division of the ileocolic branch with reconstitution via hypertrophied arcade collaterals. Confirmed on multiple oblique angiograms is an ill-defined area of hyperemia, potentially neovascularity, within the left lower abdomen, felt to correlate with the ill-defined area of hyperemia on preceding CTA. While predominantly supplied by the caudal most reconstituted ileocolic division, there is a lack of a definitive or dominant vessel supplying this ill-defined area of hyperemia, rather it appears to be supplied by markedly tortuous arterial collaterals. Multiple arteriograms were performed and while the area of ill-defined hyperemia persisted, there is no definitive intraluminal contrast extravasation to suggest ongoing GI bleeding. IMPRESSION: Persistent ill-defined area of hyperemia/potential neovascularity within the left lower abdomen, likely correlating with the ill-defined area of hyperemia seen on preceding CTA, without discrete area of intraluminal contrast extravasation. Findings are nonspecific though worrisome for the presence of an underlying small bowel tumor. The patient is a poor candidate for percutaneous embolization secondary to short-segment occlusion involving the caudal most ileocolic division as well as lack of a dominant  arterial supply to the ill-defined area of hyperemia/potentially neovascularity, placing the patient at high risk for post procedure small bowel ischemia. PLAN: - The patient is to remain flat for 4 hours with right leg straight. - As findings may represent the presence of an underlying small bowel tumor, surgical consultation could be performed as indicated. Above was discussed with both Drs. Singh [facility]) and Mansouraty (GI) at the time of procedure completion. Electronically Signed   By: Sandi Mariscal M.D.   On: 04/04/2021 15:14   IR US Guide Vasc Access Left  Result Date: 04/04/2021 INDICATION: Concern for small bowel bleeding. Please perform mesenteric arteriogram potential embolization as indicated. EXAM: 1. ULTRASOUND GUIDANCE FOR ARTERIAL ACCESS 2. SELECTIVE SUPERIOR MESENTERIC ARTERIOGRAM COMPARISON:  CTA abdomen pelvis-earlier same day; 04/01/2021; nuclear medicine tagged RBC study-04/02/2021 MEDICATIONS: None ANESTHESIA/SEDATION: Moderate (conscious) sedation was employed during this procedure. A total of Versed 1.5 mg and Fentanyl 50 mcg was administered intravenously. Moderate Sedation Time: 28 minutes. The patient's level of consciousness and vital signs were monitored continuously by radiology nursing throughout the procedure under my direct supervision. CONTRAST:  100 cc Omnipaque 300 FLUOROSCOPY TIME:  1 minute, 42 seconds (825 mGy) COMPLICATIONS: None immediate. PROCEDURE: Informed consent was obtained from the patient following explanation of the procedure, risks, benefits and alternatives. All questions were addressed. A time out was performed prior to the initiation of the procedure. Maximal barrier sterile technique utilized including caps, mask, sterile gowns, sterile gloves, large sterile drape, hand hygiene, and chlorhexidine prep. Given the presence of significant vaso occlusive disease affecting the right external iliac  artery, the decision was made to perform a left common femoral  arterial approach. As such, the left femoral head was marked fluoroscopically. Under sterile conditions and local anesthesia, the right common femoral artery access was performed with a micropuncture needle. Under direct ultrasound guidance, the left common femoral was accessed with a micropuncture kit. An ultrasound image was saved for documentation purposes. This allowed for placement of a 5-French vascular sheath. A limited arteriogram was performed through the side arm of the sheath confirming appropriate access within the left common femoral artery. Over a Bentson wire, a Mickelson catheter was advanced the caudal aspect of the thoracic aorta where was reformed, back bled and flushed. The Mickelson catheter was then utilized to select the superior mesenteric artery and a selective superior mesenteric arteriogram was performed. Multiple selective superior mesenteric arteriograms were performed in various obliquities. Images were reviewed and the procedure was terminated. All wires, catheters and sheaths were removed from the patient. Hemostasis was achieved at the right groin access site with manual compression. The patient tolerated the procedure well without immediate post procedural complication. FINDINGS: Selective superior mesenteric arteriogram demonstrates 3 separate ileocolic branches arising from the left side of the main trunk of the SMA. There is short-segment occlusion involving the caudal most division of the ileocolic branch with reconstitution via hypertrophied arcade collaterals. Confirmed on multiple oblique angiograms is an ill-defined area of hyperemia, potentially neovascularity, within the left lower abdomen, felt to correlate with the ill-defined area of hyperemia on preceding CTA. While predominantly supplied by the caudal most reconstituted ileocolic division, there is a lack of a definitive or dominant vessel supplying this ill-defined area of hyperemia, rather it appears to be supplied  by markedly tortuous arterial collaterals. Multiple arteriograms were performed and while the area of ill-defined hyperemia persisted, there is no definitive intraluminal contrast extravasation to suggest ongoing GI bleeding. IMPRESSION: Persistent ill-defined area of hyperemia/potential neovascularity within the left lower abdomen, likely correlating with the ill-defined area of hyperemia seen on preceding CTA, without discrete area of intraluminal contrast extravasation. Findings are nonspecific though worrisome for the presence of an underlying small bowel tumor. The patient is a poor candidate for percutaneous embolization secondary to short-segment occlusion involving the caudal most ileocolic division as well as lack of a dominant arterial supply to the ill-defined area of hyperemia/potentially neovascularity, placing the patient at high risk for post procedure small bowel ischemia. PLAN: - The patient is to remain flat for 4 hours with right leg straight. - As findings may represent the presence of an underlying small bowel tumor, surgical consultation could be performed as indicated. Above was discussed with both Drs. Singh [facility]) and Mansouraty (GI) at the time of procedure completion. Electronically Signed   By: Sandi Mariscal M.D.   On: 04/04/2021 15:14   CT ANGIO GI BLEED  Result Date: 04/04/2021 CLINICAL DATA:  Recent endoscopy with concern for small bowel bleeding. EXAM: CTA ABDOMEN AND PELVIS WITHOUT AND WITH CONTRAST TECHNIQUE: Multidetector CT imaging of the abdomen and pelvis was performed using the standard protocol during bolus administration of intravenous contrast. Multiplanar reconstructed images and MIPs were obtained and reviewed to evaluate the vascular anatomy. CONTRAST:  192m OMNIPAQUE IOHEXOL 350 MG/ML SOLN COMPARISON:  CT abdomen pelvis-04/01/2021; tagged nuclear medicine RBC bleeding study-04/02/2021 FINDINGS: VASCULAR Aorta: There is a large amount of calcified and noncalcified  atherosclerotic plaque within a normal caliber abdominal aorta, not resulting in a hemodynamically significant stenosis. No evidence of abdominal aortic dissection or periaortic stranding. Celiac:  There is a moderate to large amount of calcified atherosclerotic plaque involving the origin of the celiac artery, not resulting in a hemodynamically significant stenosis. Conventional branching pattern. SMA: There is a large amount of calcified and noncalcified atherosclerotic plaque involving the origin and main trunk of the SMA, not definitely resulting in a hemodynamically significant stenosis. There is a large amount of calcified atherosclerotic plaque involving the origin and proximal aspects of the main branches of the SMA likely resulting in tandem areas of hemodynamically significant narrowing though suboptimally evaluated due to mural calcifications. Ill-defined area of hyperemia and potential intraluminal contrast extravasation involving the mid jejunum is likely supplied via one of the tertiary branches of the SMA. Renals: Right renal artery is duplicated, nearly co-dominant. There is unchanged atrophy involving the inferior pole of the right kidney, likely vascular in etiology. There is a large amount of calcified atherosclerotic plaque involving the origin of all renal arteries, resulting at least 50% luminal narrowing bilaterally. There is unchanged atrophy involving the inferior pole of the right kidney, likely vascular in etiology. IMA: There is a suspected hemodynamically significant narrowing/subtotal occlusion involving the origin of the IMA with reconstitution distally, presumably from collateral supply from the SMA. Inflow: There is a moderate amount of calcified atherosclerotic plaque involving the bilateral common iliac arteries, not resulting in hemodynamically significant stenosis. There is a large amount of predominantly calcified atherosclerotic plaque involving the right external iliac artery  approaching tandem areas of at least 50% luminal narrowing. The left external iliac artery is of normal caliber and widely patent without hemodynamically significant narrowing. Proximal Outflow: There is a moderate amount of eccentric predominantly calcified atherosclerotic plaque involving the right common femoral artery, not definitely resulting in hemodynamically significant stenosis. The left common femoral artery is of normal caliber and widely patent without hemodynamically significant narrowing. The imaged portions of the bilateral superficial and deep femoral arteries are of normal caliber and widely patent without hemodynamically significant narrowing. Veins: The IVC and pelvic venous systems appear widely patent. Review of the MIP images confirms the above findings. _________________________________________________________ NON-VASCULAR Lower chest: Limited visualization of the lower thorax is negative for focal airspace opacity or pleural effusion. Normal heart size.  No pericardial effusion. Hepatobiliary: Normal hepatic contour. No discrete hepatic lesions. Normal appearance of the gallbladder given degree distention. No radiopaque gallstones. No intra or extrahepatic biliary duct dilatation. No ascites. Pancreas: Normal appearance of the pancreas. Spleen: Normal appearance of the spleen. Adrenals/Urinary Tract: . Stomach/Bowel: There is an ill-defined area hyperemia and potential intraluminal contrast extravasation involving the mid jejunum within the left mid hemiabdomen (axial image 91, series 15; coronal image 67, series 13). No associated bowel wall thickening. No additional discrete areas of intraluminal contrast extravasation are identified. Moderate colonic stool burden without evidence of enteric obstruction. Endoscopy capsule is seen at the level of the distal sigmoid colon. Extensive colonic diverticulosis without evidence superimposed acute diverticulitis. Normal appearance of the terminal  ileum and the appendix. Suspected approximally 0.8 cm intraluminal lipoma at the level of the cecum (image 99, series 15), similar to the 04/01/2021 examination. No pneumoperitoneum, pneumatosis or portal venous gas. Lymphatic: No bulky retroperitoneal, mesenteric, pelvic or inguinal lymphadenopathy. Reproductive: Normal appearance the prostate gland. No free fluid the pelvic cul-de-sac. Other: Asymmetric atrophy involving the right rectus abdominal musculature. Subcutaneous edema about the midline of the low back. Musculoskeletal: No acute or aggressive osseous abnormalities. There is partial lumbarization of the S1 vertebral body. Stigmata of dish within the thoracic spine. Moderate  bilateral facet degenerative change the lower lumbar spine. Mild-to-moderate degenerative change the bilateral hips with joint space loss, subchondral sclerosis and osteophytosis. IMPRESSION: VASCULAR 1. The examination is positive for hyperemia and potential intraluminal contrast extravasation involving the mid jejunum within the left mid hemiabdomen, likely supplied via tertiary branches from the SMA. The SMA is heavily diseased with suspected areas of hemodynamically significant narrowings involving its major branches and divisions. 2. Large amount of atherosclerotic plaque within normal caliber abdominal aorta, not resulting in a hemodynamically significant stenosis. 3. Suspected hemodynamically significant narrowings involving the bilateral renal arteries including geographic atrophy involving the inferior pole the right kidney, likely vascular in etiology. 4.  Aortic Atherosclerosis (ICD10-I70.0). NON-VASCULAR 1. Colonic diverticulosis without evidence superimposed acute diverticulitis. Electronically Signed   By: Sandi Mariscal M.D.   On: 04/04/2021 13:17     Scheduled Meds:  sodium chloride   Intravenous Once   sodium chloride   Intravenous Once   pantoprazole (PROTONIX) IV  40 mg Intravenous Q12H   Continuous Infusions:   lactated ringers 25 mL/hr at 04/05/21 0857   PRN Meds:.acetaminophen **OR** acetaminophen, hydrALAZINE  ASSESMENT:   Painless hematochezia. 04/01/2021 CT angio: focal area of eccentric arterial phase enhancement and punctate calcifications at mid small bowel, not able to rule out number-year-old, intraluminal lesion.  Extensive aortic atherosclerosis at SMA.  IMA occluded, celiac axis widely patent.  Descending, sigmoid diverticulosis. 04/02/2021 nuclear medicine RBC scan: No active bleeding. 12/25 VCE: Gastritis, gastric erosions, gastric ulcer versus duodenal ulcer.  Capsule retained at duodenum but eventually passed.  Blood noted at 2 hours, 40 minutes into the small bowel.  Fast transit with blood products throughout the small bowel not allowing for adequate visualization of the majority of the small bowel.  Large volume blood products seen in colon  04/04/2021 SBE: Nonobstructing Schatzki ring, regular Z-line.  2 cm hiatal hernia.  Biopsies obtained of erosive, friable gastritis.  Multiple nonbleeding, clean-based duodenal ulcers also biopsied.  Deformity in angle of D1/D2, ? Post ulcer scarring?  Distal extent of exam tattooed.  Had colonoscopy 11/2019 for surveillance of history of previous polyps.  6 polyps removed, some of these were tubular adenomas.   04/04/2021 CT angio: Hyperemia and potential intraluminal contrast extravasation at mid jejunum in the left mid hemiabdomen, likely supplied via tertiary branches of SMA.  SMA is heavily diseased with suspected areas of hemodynamically significant narrowing of major branches and divisions.  Large amount of atherosclerotic plaque but normal caliber abdominal aorta.  Suspicion of hemodynamically significant narrowings of renal arteries bilaterally and atrophy in right kidney likely vascular in etiology.  Colonic diverticulosis, no diverticulitis.  Blood loss anemia.  Initial Hgb 11.6, nadir 7.2, currently 7.9.  received 7the PRBC this AM.  1 unit  platelets ordered, will transfuse soon.    PLAN   Inpatient transfer to [facility] for possible balloon enteroscopy at [facility] when/if bed available  Leave Protonix 40 IV bid in place or switch to 40 po bid?    [name] [name]  04/05/2021, 1:06 PM Phone [phone]     Attending Physician Note   I have taken a history, reviewed the chart and examined the patient. I personally saw the patient and performed a substantive portion of this encounter, including a complete performance of at least one of the key components, in conjunction with the APP. I agree with the APP's note, impression and recommendations.   *GI bleed with hematochezia. Potential neovascularity in the left abdomen on angiogram that could  be a small bowel tumor and that corresponds to mid jejunal hyperemia and potential contrast extravasation on CTA. Inpatient transfer to [facility] for further mgmt, consideration of balloon enteroscopy. We await a bed becoming available.   *ABL anemia. Trend CBC. Transfusions to maintain Hgb > 7.   *Non-bleeding duodenal ulcers, gastritis. Gastric biopsies are pending. Continue pantoprazole 40 mg po bid for now. Change to 40 mg qd in 1 week.    GI going to standby. Please call for questions, problems.   Lucio Edward, MD [facility] See [facility], [facility], for our on call provider

## 2021-04-05 NOTE — Progress Notes (Signed)
Blood transfusion ended at 0154 as in flowsheet details reflects

## 2021-04-05 NOTE — Progress Notes (Signed)
Transport arrived. Pt vitals stable. Report Given to RN Donnetta Simpers at [facility].

## 2021-04-05 NOTE — Progress Notes (Signed)
OT Cancellation Note  Patient Details Name: [name] [name] MRN: [id number] DOB: 08/19/1955   Cancelled Treatment:    Reason Eval/Treat Not Completed: Medical issues which prohibited therapy. Pt with dizziness, getting blood transfusion per RN.  Malka So 04/05/2021, 9:57 AM Nestor Lewandowsky, OTR/L Acute Rehabilitation Services Pager: [phone] Office: [phone]

## 2021-04-05 NOTE — Progress Notes (Signed)
PT Cancellation Note  Patient Details Name: [name] [name] MRN: [id number] DOB: Sep 30, 1955   Cancelled Treatment:    Reason Eval/Treat Not Completed: Medical issues which prohibited therapy.  Per pt and nursing, still in process of transfusions and then per nsg going to another hosp.  PT will hold for now.   Ramond Dial 04/05/2021, 1:24 PM  Mee Hives, PT PhD Acute Rehab Dept. Number: [place]

## 2021-04-05 NOTE — Progress Notes (Signed)
PT Cancellation Note  Patient Details Name: [name] [name] MRN: [id number] DOB: 04/14/1955   Cancelled Treatment:    Reason Eval/Treat Not Completed: Medical issues which prohibited therapy.  Pt is transfusing and dizzy and will retry at another time.   Ramond Dial 04/05/2021, 10:07 AM  Mee Hives, PT PhD Acute Rehab Dept. Number: [place]

## 2021-04-05 NOTE — Progress Notes (Signed)
Brittney from [facility] called to confirm transfer and room assignment. Pt will transfer to [facility] room [phone]. Address [place]. Receiving MD Oliver Hum MD. Phone number for RN report [phone]. [facility] transport number if [facility] unable to transport is [phone].

## 2021-04-06 LAB — BPAM RBC
Blood Product Expiration Date: 202212312359
Blood Product Expiration Date: 202301012359
Blood Product Expiration Date: 202301012359
Blood Product Expiration Date: 202301012359
Blood Product Expiration Date: 202301132359
ISSUE DATE / TIME: 202212261748
ISSUE DATE / TIME: 202212262155
ISSUE DATE / TIME: 202212270922
ISSUE DATE / TIME: 202212271658
Unit Type and Rh: 600
Unit Type and Rh: 600
Unit Type and Rh: 6200
Unit Type and Rh: 6200
Unit Type and Rh: 6200

## 2021-04-06 LAB — BPAM PLATELET PHERESIS
Blood Product Expiration Date: 202212282359
ISSUE DATE / TIME: 202212271338
Unit Type and Rh: 6200

## 2021-04-06 LAB — TYPE AND SCREEN
ABO/RH(D): A POS
Antibody Screen: NEGATIVE
Unit division: 0
Unit division: 0
Unit division: 0
Unit division: 0
Unit division: 0

## 2021-04-06 LAB — PREPARE PLATELET PHERESIS: Unit division: 0

## 2021-04-07 ENCOUNTER — Encounter: Payer: Self-pay | Admitting: Gastroenterology

## 2021-04-07 LAB — SURGICAL PATHOLOGY

## 2021-04-19 ENCOUNTER — Encounter: Payer: Self-pay | Admitting: Family Medicine

## 2021-04-19 ENCOUNTER — Ambulatory Visit (INDEPENDENT_AMBULATORY_CARE_PROVIDER_SITE_OTHER): Payer: Medicare Other | Admitting: Family Medicine

## 2021-04-19 VITALS — BP 136/70 | HR 75 | Temp 98.5°F | Wt 233.0 lb

## 2021-04-19 DIAGNOSIS — K922 Gastrointestinal hemorrhage, unspecified: Secondary | ICD-10-CM

## 2021-04-19 DIAGNOSIS — I10 Essential (primary) hypertension: Secondary | ICD-10-CM | POA: Diagnosis not present

## 2021-04-19 DIAGNOSIS — K921 Melena: Secondary | ICD-10-CM

## 2021-04-19 LAB — CBC WITH DIFFERENTIAL/PLATELET
Basophils Absolute: 0 10*3/uL (ref 0.0–0.1)
Basophils Relative: 1.3 % (ref 0.0–3.0)
Eosinophils Absolute: 0.1 10*3/uL (ref 0.0–0.7)
Eosinophils Relative: 3.5 % (ref 0.0–5.0)
HCT: 28.4 % — ABNORMAL LOW (ref 39.0–52.0)
Hemoglobin: 9.3 g/dL — ABNORMAL LOW (ref 13.0–17.0)
Lymphocytes Relative: 17.7 % (ref 12.0–46.0)
Lymphs Abs: 0.6 10*3/uL — ABNORMAL LOW (ref 0.7–4.0)
MCHC: 32.6 g/dL (ref 30.0–36.0)
MCV: 79.9 fl (ref 78.0–100.0)
Monocytes Absolute: 0.4 10*3/uL (ref 0.1–1.0)
Monocytes Relative: 12.5 % — ABNORMAL HIGH (ref 3.0–12.0)
Neutro Abs: 2.2 10*3/uL (ref 1.4–7.7)
Neutrophils Relative %: 65 % (ref 43.0–77.0)
Platelets: 305 10*3/uL (ref 150.0–400.0)
RBC: 3.56 Mil/uL — ABNORMAL LOW (ref 4.22–5.81)
RDW: 19.1 % — ABNORMAL HIGH (ref 11.5–15.5)
WBC: 3.4 10*3/uL — ABNORMAL LOW (ref 4.0–10.5)

## 2021-04-19 LAB — BASIC METABOLIC PANEL
BUN: 19 mg/dL (ref 6–23)
CO2: 26 mEq/L (ref 19–32)
Calcium: 8.5 mg/dL (ref 8.4–10.5)
Chloride: 106 mEq/L (ref 96–112)
Creatinine, Ser: 1.25 mg/dL (ref 0.40–1.50)
GFR: 59.91 mL/min — ABNORMAL LOW (ref >60.00)
Glucose, Bld: 117 mg/dL — ABNORMAL HIGH (ref 70–99)
Potassium: 4.1 mEq/L (ref 3.5–5.1)
Sodium: 138 mEq/L (ref 135–145)

## 2021-04-19 MED ORDER — AMLODIPINE BESYLATE 5 MG PO TABS: 5.0000 mg | ORAL_TABLET | Freq: Every day | ORAL | 3 refills | Status: DC

## 2021-04-19 MED ORDER — LISINOPRIL-HYDROCHLOROTHIAZIDE 20-12.5 MG PO TABS: 1.0000 | ORAL_TABLET | Freq: Every day | ORAL | 3 refills | Status: DC

## 2021-04-19 MED ORDER — ROSUVASTATIN CALCIUM 40 MG PO TABS
40.0000 mg | ORAL_TABLET | Freq: Every day | ORAL | 3 refills | Status: DC
Start: 2021-04-19 — End: 2022-03-01

## 2021-04-19 NOTE — Progress Notes (Signed)
° °  Subjective:    Patient ID: [name] [name], male    DOB: Nov 27, 1955, 66 y.o.   MRN: [id number]  HPI Here to follow up 2 recent hospital stays for melena. He was in [place] from 04-01-21 to 04-05-21, and then he was in [place] from 04-05-21 to 04-11-21. He presented with weakness and dark tarry stools. He never had nausea or any pain. At [facility] he had upper and lower endoscopy as well as capsule endoscopy, but no source of bleeding was found. At [facility] these procedures were repeated, again with no source of bleeding being found. A NM RBC scan showed no active bleeding. A CT angiogram showed an area of fuzziness in the small bowel in the LLQ, and it was felt that this could represent either angiodysplasia or a tumor. This was never visualized during the endoscopies. His Hgb on admission was 6.9 and this was up to 7.9 at DC. During these 2 stays he received a total of 12 units of PRBC and one unit of platelets. He was sent home on Pantoprazole 40 mg BID and he was told to avoid all NSAID's. Today he feels weak but has no other complaints. His appetite is good. He has seen no further signs of blood in the stool.    Review of Systems  Constitutional:  Positive for fatigue.  Respiratory: Negative.    Cardiovascular: Negative.   Gastrointestinal: Negative.       Objective:   Physical Exam Constitutional:      Comments: He appears somewhat weak   Cardiovascular:     Rate and Rhythm: Normal rate and regular rhythm.     Pulses: Normal pulses.     Heart sounds: Normal heart sounds.  Pulmonary:     Effort: Pulmonary effort is normal.     Breath sounds: Normal breath sounds.  Abdominal:     General: Abdomen is flat. Bowel sounds are normal. There is no distension.     Palpations: Abdomen is soft. There is no mass.     Tenderness: There is no abdominal tenderness. There is no guarding or rebound.     Hernia: No hernia is present.  Neurological:     Mental Status: He is alert.          Assessment & Plan:   Recent GI bleeding from an unknown source, it seems to have stopped for now. We will check a CBC and BMET today. He does not have a follow up scheduled with GI as yet. We spent a total of ( 35  ) minutes reviewing records and discussing these issues.  Alysia Penna, MD

## 2021-04-20 ENCOUNTER — Other Ambulatory Visit: Payer: Self-pay

## 2021-04-20 DIAGNOSIS — D649 Anemia, unspecified: Secondary | ICD-10-CM

## 2021-04-28 ENCOUNTER — Telehealth: Payer: Self-pay | Admitting: Family Medicine

## 2021-04-28 NOTE — Telephone Encounter (Signed)
Patient called in requesting to speak with Dr.Fry's CMA. He stated that this call is regarding him getting a referral to get a PCR Covid test.  Patient stated that he spoke with [facility] and they stated that the only way that they could provide him one is if Dr.Burchette put in a referral.  Patient stated that he needs this PCR test because he have surgery in a few weeks.  Patient is requesting to be contacted at [phone].  Please advise.

## 2021-04-28 NOTE — Telephone Encounter (Signed)
Pt is having surgery next Thursday at [facility] [place] states that [name] [name] needs a COVID-19 PCR test done by Tuesday, states that he was advised to request PCP for a referral to a testing site since [facility] is far for him to drive just to have a COVID-19 test. I spoke with Garnette Czech RN supervisor who states that our office is not able to test pt since we need a diagnosis and we are not a testing site. Please advise

## 2021-04-29 NOTE — Telephone Encounter (Signed)
Noted  

## 2021-04-29 NOTE — Telephone Encounter (Signed)
Pt is aware of the message and was given address [address] and phone number [phone]

## 2021-04-29 NOTE — Telephone Encounter (Signed)
I am not aware of any [facility] sites that are doing this routinely anymore. My suggestion is for him to go to the [facility] site on [address] and ask them to run this test. That way his results will be in Epic

## 2021-05-03 ENCOUNTER — Ambulatory Visit (HOSPITAL_COMMUNITY)
Admission: EM | Admit: 2021-05-03 | Discharge: 2021-05-03 | Disposition: A | Discharge status: Home / Self Care | Payer: Medicare Other | Attending: Internal Medicine | Admitting: Internal Medicine

## 2021-05-03 ENCOUNTER — Other Ambulatory Visit: Payer: Self-pay

## 2021-05-03 ENCOUNTER — Encounter (HOSPITAL_COMMUNITY): Payer: Self-pay

## 2021-05-03 DIAGNOSIS — Z20822 Contact with and (suspected) exposure to covid-19: Secondary | ICD-10-CM | POA: Insufficient documentation

## 2021-05-03 DIAGNOSIS — Z0189 Encounter for other specified special examinations: Secondary | ICD-10-CM

## 2021-05-03 DIAGNOSIS — Z01812 Encounter for preprocedural laboratory examination: Secondary | ICD-10-CM | POA: Diagnosis present

## 2021-05-03 NOTE — ED Triage Notes (Signed)
Pt presents for COVID testing for surgery 05/06/21. States the will cut part of his intestines. Pt denies fever, shortness of breath, cough, or any other symptoms.

## 2021-05-04 LAB — SARS CORONAVIRUS 2 (TAT 6-24 HRS): SARS Coronavirus 2: NEGATIVE

## 2021-05-23 ENCOUNTER — Other Ambulatory Visit (INDEPENDENT_AMBULATORY_CARE_PROVIDER_SITE_OTHER): Payer: Medicare Other

## 2021-05-23 DIAGNOSIS — D649 Anemia, unspecified: Secondary | ICD-10-CM | POA: Diagnosis not present

## 2021-05-23 LAB — CBC WITH DIFFERENTIAL/PLATELET
Basophils Absolute: 0 10*3/uL (ref 0.0–0.1)
Basophils Relative: 0.9 % (ref 0.0–3.0)
Eosinophils Absolute: 0.2 10*3/uL (ref 0.0–0.7)
Eosinophils Relative: 6.6 % — ABNORMAL HIGH (ref 0.0–5.0)
HCT: 31.1 % — ABNORMAL LOW (ref 39.0–52.0)
Hemoglobin: 9.8 g/dL — ABNORMAL LOW (ref 13.0–17.0)
Lymphocytes Relative: 18 % (ref 12.0–46.0)
Lymphs Abs: 0.7 10*3/uL (ref 0.7–4.0)
MCHC: 31.6 g/dL (ref 30.0–36.0)
MCV: 68.4 fl — ABNORMAL LOW (ref 78.0–100.0)
Monocytes Absolute: 0.4 10*3/uL (ref 0.1–1.0)
Monocytes Relative: 9.5 % (ref 3.0–12.0)
Neutro Abs: 2.4 10*3/uL (ref 1.4–7.7)
Neutrophils Relative %: 65 % (ref 43.0–77.0)
Platelets: 258 10*3/uL (ref 150.0–400.0)
RBC: 4.54 Mil/uL (ref 4.22–5.81)
RDW: 22.9 % — ABNORMAL HIGH (ref 11.5–15.5)
WBC: 3.7 10*3/uL — ABNORMAL LOW (ref 4.0–10.5)

## 2021-05-25 ENCOUNTER — Other Ambulatory Visit: Payer: Self-pay

## 2021-05-25 DIAGNOSIS — D62 Acute posthemorrhagic anemia: Secondary | ICD-10-CM

## 2021-07-25 ENCOUNTER — Encounter: Payer: Self-pay | Admitting: Family Medicine

## 2021-07-26 ENCOUNTER — Encounter: Payer: Self-pay | Admitting: Family Medicine

## 2021-07-26 ENCOUNTER — Ambulatory Visit (INDEPENDENT_AMBULATORY_CARE_PROVIDER_SITE_OTHER): Payer: Medicare Other | Admitting: Family Medicine

## 2021-07-26 VITALS — BP 160/96 | HR 66 | Temp 98.5°F | Wt 247.0 lb

## 2021-07-26 DIAGNOSIS — C49A3 Gastrointestinal stromal tumor of small intestine: Secondary | ICD-10-CM

## 2021-07-26 DIAGNOSIS — D62 Acute posthemorrhagic anemia: Secondary | ICD-10-CM

## 2021-07-26 DIAGNOSIS — I10 Essential (primary) hypertension: Secondary | ICD-10-CM

## 2021-07-26 MED ORDER — AMLODIPINE BESYLATE 5 MG PO TABS: 10.0000 mg | ORAL_TABLET | Freq: Every day | ORAL | 3 refills | Status: DC

## 2021-07-26 NOTE — Telephone Encounter (Signed)
He will need an OV (30 minutes) so we can review all this  ?

## 2021-07-26 NOTE — Progress Notes (Signed)
? ?  Subjective:  ? ? Patient ID: [name] [name], male    DOB: 1955-06-16, 66 y.o.   MRN: [id number] ? ?HPI ?Here to follow up after a hospital stay at [facility] from 05-06-21 to 05-07-21 to have a small section of his jejunum removed. This was done laparoscopically by Dr. Roni Bread. 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 This was the source of repeated GI bleeding he has been having for the past year. He did not require traditional chemotherapy or radiation therapy. Instead [facility] has started him on Gleevec 400 mg daily, and they anticipate him taking this for 5 years before stopping it. This is managed by Dr. Acey Lav. He had lost a lot of blood through his bowels in the past 6 months, and he has been transfused a total of 17 units of blood during that time. His last Hgb on 06-30-21 was 8.7. He had some AKI during the hospital times, and his last creatinine on 06-30-21 was 1.70 and the GFR was 44. He complains of constant fatigue, poor appetite, and swelling in his eyelids and feet. During this time his BP has been creeping up despite taking Amlodipine '5mg'$  daily (along with Lisinopril HCT). No SOB or headaches.  ? ? ?Review of Systems  ?Constitutional:  Positive for fatigue.  ?Respiratory: Negative.    ?Cardiovascular:  Positive for leg swelling. Negative for chest pain and palpitations.  ?Gastrointestinal: Negative.   ?Genitourinary: Negative.   ?Neurological: Negative.   ? ?   ?Objective:  ? Physical Exam ?Constitutional:   ?   General: He is not in acute distress. ?   Appearance: Normal appearance.  ?Cardiovascular:  ?   Rate and Rhythm: Normal rate and regular rhythm.  ?   Pulses: Normal pulses.  ?   Heart sounds: Normal heart sounds.  ?Pulmonary:  ?   Effort: Pulmonary effort is normal.  ?   Breath sounds: Normal breath sounds.  ?Musculoskeletal:  ?   Comments: Trace edema in both feet and in all 4 eyelids.   ?Neurological:  ?   Mental Status: He is alert.   ? ? ? ? ? ?   ?Assessment & Plan:  ?He is recovering from surgical removal of a GIST in the small bowel,  and he has stabilized. He is taking Gleevek for tumor suppression and he will follow up with [facility]. He is playing tennis again, albeit for short periods of time. I suggested he supplement his diet with shakes like Boost or Ensure. We will get labs today including another CBC and a BMET. For the HTN we will increase the Amlodipine to 10 mg daily. Recheck in 4 weeks. We spent a total of ( 35  ) minutes reviewing records and discussing these issues.  ?Alysia Penna, MD ? ?

## 2021-08-19 ENCOUNTER — Encounter: Payer: Self-pay | Admitting: Family Medicine

## 2021-08-19 NOTE — Telephone Encounter (Signed)
Cancel the 5 mg Amlodipine. Instead call in 10 mg Amlodipine to take daily, #90 with 3 rf. To help build his blood back up, also call in Ferrous sulfate 325 mg to take BID, #60 with 5 rf  ?

## 2021-08-22 ENCOUNTER — Other Ambulatory Visit: Payer: Self-pay

## 2021-08-22 DIAGNOSIS — I10 Essential (primary) hypertension: Secondary | ICD-10-CM

## 2021-08-22 DIAGNOSIS — D649 Anemia, unspecified: Secondary | ICD-10-CM

## 2021-08-22 MED ORDER — IRON (FERROUS SULFATE) 325 (65 FE) MG PO TABS: 325.0000 mg | ORAL_TABLET | Freq: Two times a day (BID) | ORAL | 5 refills | Status: DC

## 2021-08-22 MED ORDER — AMLODIPINE BESYLATE 10 MG PO TABS: 10.0000 mg | ORAL_TABLET | Freq: Every day | ORAL | 3 refills | Status: DC

## 2021-08-24 ENCOUNTER — Other Ambulatory Visit: Payer: Self-pay

## 2021-08-24 ENCOUNTER — Telehealth: Payer: Self-pay

## 2021-08-24 DIAGNOSIS — D649 Anemia, unspecified: Secondary | ICD-10-CM

## 2021-08-24 NOTE — Telephone Encounter (Signed)
I put in the orders  

## 2021-08-24 NOTE — Addendum Note (Signed)
Addended by: Alysia Penna A on: 08/24/2021 12:26 PM ? ? Modules accepted: Orders ? ?

## 2021-08-24 NOTE — Telephone Encounter (Signed)
Message sent to PCP for advise ?

## 2021-08-25 ENCOUNTER — Other Ambulatory Visit (INDEPENDENT_AMBULATORY_CARE_PROVIDER_SITE_OTHER): Payer: Medicare Other

## 2021-08-25 ENCOUNTER — Encounter: Payer: Self-pay | Admitting: Family Medicine

## 2021-08-25 DIAGNOSIS — D649 Anemia, unspecified: Secondary | ICD-10-CM | POA: Diagnosis not present

## 2021-08-25 LAB — CBC WITH DIFFERENTIAL/PLATELET
Basophils Absolute: 0 10*3/uL (ref 0.0–0.1)
Basophils Relative: 1.1 % (ref 0.0–3.0)
Eosinophils Absolute: 0.2 10*3/uL (ref 0.0–0.7)
Eosinophils Relative: 5.8 % — ABNORMAL HIGH (ref 0.0–5.0)
HCT: 33.6 % — ABNORMAL LOW (ref 39.0–52.0)
Hemoglobin: 10.8 g/dL — ABNORMAL LOW (ref 13.0–17.0)
Lymphocytes Relative: 21.1 % (ref 12.0–46.0)
Lymphs Abs: 0.9 10*3/uL (ref 0.7–4.0)
MCHC: 32.3 g/dL (ref 30.0–36.0)
MCV: 71.8 fl — ABNORMAL LOW (ref 78.0–100.0)
Monocytes Absolute: 0.5 10*3/uL (ref 0.1–1.0)
Monocytes Relative: 11 % (ref 3.0–12.0)
Neutro Abs: 2.6 10*3/uL (ref 1.4–7.7)
Neutrophils Relative %: 61 % (ref 43.0–77.0)
Platelets: 162 10*3/uL (ref 150.0–400.0)
RBC: 4.68 Mil/uL (ref 4.22–5.81)
RDW: 26.1 % — ABNORMAL HIGH (ref 11.5–15.5)
WBC: 4.2 10*3/uL (ref 4.0–10.5)

## 2021-08-25 LAB — IBC + FERRITIN
Ferritin: 14.2 ng/mL — ABNORMAL LOW (ref 22.0–322.0)
Iron: 99 ug/dL (ref 42–165)
Saturation Ratios: 21.8 % (ref 20.0–50.0)
TIBC: 455 ug/dL — ABNORMAL HIGH (ref 250.0–450.0)
Transferrin: 325 mg/dL (ref 212.0–360.0)

## 2021-08-25 LAB — VITAMIN B12: Vitamin B-12: 692 pg/mL (ref 211–911)

## 2021-10-12 ENCOUNTER — Encounter: Payer: Self-pay | Admitting: Family Medicine

## 2021-10-12 ENCOUNTER — Ambulatory Visit (INDEPENDENT_AMBULATORY_CARE_PROVIDER_SITE_OTHER): Payer: Medicare Other | Admitting: Family Medicine

## 2021-10-12 VITALS — BP 120/70 | HR 63 | Temp 97.7°F | Wt 242.0 lb

## 2021-10-12 DIAGNOSIS — M25551 Pain in right hip: Secondary | ICD-10-CM | POA: Diagnosis not present

## 2021-10-12 DIAGNOSIS — K819 Cholecystitis, unspecified: Secondary | ICD-10-CM

## 2021-10-12 DIAGNOSIS — G8929 Other chronic pain: Secondary | ICD-10-CM

## 2021-10-12 LAB — HEPATIC FUNCTION PANEL
ALT: 83 U/L — ABNORMAL HIGH (ref 0–53)
AST: 61 U/L — ABNORMAL HIGH (ref 0–37)
Albumin: 3.9 g/dL (ref 3.5–5.2)
Alkaline Phosphatase: 56 U/L (ref 39–117)
Bilirubin, Direct: 0.2 mg/dL (ref 0.0–0.3)
Total Bilirubin: 0.6 mg/dL (ref 0.2–1.2)
Total Protein: 5.9 g/dL — ABNORMAL LOW (ref 6.0–8.3)

## 2021-10-12 LAB — LIPASE: Lipase: 87 U/L — ABNORMAL HIGH (ref 11.0–59.0)

## 2021-10-12 NOTE — Progress Notes (Signed)
   Subjective:    Patient ID: [name] [name], male    DOB: 17-Aug-1955, 66 y.o.   MRN: [id number]  HPI Here to follow up on a hospital stay at [facility] in [place] from 09-30-21 to 10-03-21. He presented with the sudden onset of severe RUQ abdominal pain and nausea. No fever. His WBC was normal at 7.2, Hgb was 11.3, and creatinine was 1.23. An abdominal US showed cholelithiasis with acute cholecystitis. The ALT was up to 251, the AST was 207, and the lipase was >2500, indicating pancreatitis. He had a laparoscopic cholecystectomy, and his pain was almost immediately improved. By his DC the ALT was down to 129 and the AST was down to 71. He has been avoiding fatty foods, and his BMs have been normal. Appetite is good. He now has no pain except for some incisional soreness. On another note, he is still dealing with chronic right hip pain. He has seen Dr. Erlinda Hong at [facility] for this, and he has had an US guided injection of cortisone as well as PT. Nothing has helped the pain. He says the one activity that reliably causes the pain is walking for more than 100 feet. When doing this he feels a sudden sharp pain in the hip, and when he stops walking the pain quickly resolves in a few seconds. He asks to see [place] for a second opinion.    Review of Systems  Constitutional: Negative.   Respiratory: Negative.    Cardiovascular: Negative.   Gastrointestinal: Negative.   Musculoskeletal:  Positive for arthralgias.       Objective:   Physical Exam Constitutional:      General: He is not in acute distress.    Appearance: Normal appearance.  Cardiovascular:     Rate and Rhythm: Normal rate and regular rhythm.     Pulses: Normal pulses.     Heart sounds: Normal heart sounds.  Pulmonary:     Effort: Pulmonary effort is normal.     Breath sounds: Normal breath sounds.  Abdominal:     General: Abdomen is flat. Bowel sounds are normal. There is no distension.     Palpations:  Abdomen is soft. There is no mass.     Tenderness: There is no abdominal tenderness. There is no guarding or rebound.     Hernia: No hernia is present.  Musculoskeletal:        General: No swelling, tenderness or deformity.  Neurological:     Mental Status: He is alert.           Assessment & Plan:  He is S/P a recent cholecystectomy and he seems to be doing very well. We will check transaminase levels and lipase today to make sure they are coming down. As for the right hip pain, we will refer him to [facility]. We spent a total of (35   ) minutes reviewing records and discussing these issues.  Alysia Penna, MD

## 2021-11-16 ENCOUNTER — Telehealth (INDEPENDENT_AMBULATORY_CARE_PROVIDER_SITE_OTHER): Payer: Medicare Other

## 2021-11-16 ENCOUNTER — Ambulatory Visit: Payer: Medicare Other

## 2021-11-16 VITALS — Ht 71.0 in | Wt 235.0 lb

## 2021-11-16 DIAGNOSIS — Z0000 Encounter for general adult medical examination without abnormal findings: Secondary | ICD-10-CM | POA: Diagnosis not present

## 2021-11-16 NOTE — Progress Notes (Signed)
Subjective:   [name] [name] is a 66 y.o. male who presents for Medicare Annual/Subsequent preventive examination.  Review of Systems    Virtual Visit via Telephone Note  I connected with  [name] [name] on 11/16/21 at  8:15 AM EDT by telephone and verified that I am speaking with the correct person using two identifiers.  Location: Patient: Home Provider: Office Persons participating in the virtual visit: patient/Nurse Health Advisor   I discussed the limitations, risks, security and privacy concerns of performing an evaluation and management service by telephone and the availability of in person appointments. The patient expressed understanding and agreed to proceed.  Interactive audio and video telecommunications were attempted between this nurse and patient, however failed, due to patient having technical difficulties OR patient did not have access to video capability.  We continued and completed visit with audio only.  Some vital signs may be absent or patient reported.   Criselda Peaches, LPN  Cardiac Risk Factors include: advanced age (>20mn, >>64women);hypertension;male gender     Objective:    Today's Vitals   11/16/21 0820  Weight: 235 lb (106.6 kg)  Height: 5' 11"  (1.803 m)   Body mass index is 32.78 kg/m.     11/16/2021    8:29 AM 04/01/2021    2:27 AM 11/03/2020    8:12 AM 04/02/2016    3:26 AM 03/20/2016    1:49 PM 06/16/2014    9:31 AM  Advanced Directives  Does Patient Have a Medical Advance Directive? Yes No Yes No Yes Yes  Type of [place]amed[place]of [place]  Living will;Healthcare Power of [place] will   Does patient want to make changes to medical advance directive? No - Patient declined     No - Patient declined  Copy of [place] Chart? No - copy requested  No - copy requested   No - copy requested    Current Medications (verified) Outpatient Encounter  Medications as of 11/16/2021  Medication Sig   amLODipine (NORVASC) 10 MG tablet Take 1 tablet (10 mg total) by mouth daily.   imatinib (GLEEVEC) 400 MG tablet Take by mouth.   Iron, Ferrous Sulfate, 325 (65 Fe) MG TABS Take 325 mg by mouth 2 (two) times daily with a meal.   lisinopril-hydrochlorothiazide (ZESTORETIC) 20-12.5 MG tablet Take 1 tablet by mouth daily.   pantoprazole (PROTONIX) 40 MG tablet Take 40 mg by mouth 2 (two) times daily.   rosuvastatin (CRESTOR) 40 MG tablet Take 1 tablet (40 mg total) by mouth daily.   No facility-administered encounter medications on file as of 11/16/2021.    Allergies (verified) Patient has no known allergies.   History: Past Medical History:  Diagnosis Date   Ankle fracture    [place]   testicular cancer-at age 66  Cataract    bilateral sx   GERD (gastroesophageal reflux disease)    hx of   Hx of colonic polyps    [place] unsure,[place]   Hyperlipidemia    on meds   Hypertension    on meds   Kidney stones    Patella fracture    Retinal detachment    OD   Retinal tear of right eye    Syncopal episodes 2008   Past Surgical History:  Procedure Laterality Date   AIR/FLUID EXCHANGE Right 06/16/2014   Procedure: AIR/FLUID  EXCHANGE;  Surgeon: Hayden Pedro, MD;  Location: [place];  Service: Ophthalmology;  Laterality: Right;   BIOPSY  04/04/2021   Procedure: BIOPSY;  Surgeon: Rush Landmark Telford Nab., MD;  Location: [place];  Service: Gastroenterology;;   CATARACT EXTRACTION     CATARACT EXTRACTION  09/2014   rt eye//left 01/2015   CATARACT EXTRACTION, BILATERAL  2016   per Dr. Herbert Deaner    COLONOSCOPY  02/16/2020   per Dr. Hilarie Fredrickson, adenomatous polyps, repeat in 3 yrs   ENTEROSCOPY N/A 04/04/2021   Procedure: ENTEROSCOPY;  Surgeon: Mansouraty, Telford Nab., MD;  Location: [place];  Service: Gastroenterology;  Laterality: N/A;   EYE SURGERY     GAS INSERTION Right 06/16/2014    Procedure: INSERTION OF GAS;  Surgeon: Hayden Pedro, MD;  Location: [place];  Service: Ophthalmology;  Laterality: Right;  [id number]   GIVENS CAPSULE STUDY N/A 04/02/2021   Procedure: GIVENS CAPSULE STUDY;  Surgeon: [name]., MD;  Location: [place];  Service: Gastroenterology;  Laterality: N/A;   HYDROCELE EXCISION     IR ANGIOGRAM VISCERAL SELECTIVE  04/04/2021   IR US GUIDE VASC ACCESS LEFT  04/04/2021   KNEE ARTHROSCOPY Right    x 2   LASER PHOTO ABLATION Right 06/16/2014   Procedure: LASER PHOTO ABLATION;  Surgeon: Hayden Pedro, MD;  Location: [place];  Service: Ophthalmology;  Laterality: Right;  Headscope laser and endolaser    ORCHIECTOMY     right testicle   REPAIR OF COMPLEX TRACTION RETINAL DETACHMENT Right 06/16/2014   Procedure: REPAIR OF COMPLEX TRACTION RETINAL DETACHMENT;  Surgeon: Hayden Pedro, MD;  Location: [place];  Service: Ophthalmology;  Laterality: Right;   RETINAL DETACHMENT SURGERY  06/2014   SUBMUCOSAL TATTOO INJECTION  04/04/2021   Procedure: SUBMUCOSAL TATTOO INJECTION;  Surgeon: [name]., MD;  [facility];  Service: Gastroenterology;;   TONSILLECTOMY     VITRECTOMY Right 06/16/2014   WISDOM TOOTH EXTRACTION     Family History  Problem Relation Age of Onset   Parkinson's disease Mother    Arrhythmia Father    Coronary artery disease Other        fhx   Hypertension Other        fhx   Sudden death Other        fhx   Colon cancer Neg Hx    Colon polyps Neg Hx    Esophageal cancer Neg Hx    Rectal cancer Neg Hx    Stomach cancer Neg Hx    Social History   Socioeconomic History   Marital status: Married    Spouse name: Not on file   Number of children: Not on file   Years of education: Not on file   Highest education level: Not on file  Occupational History   Occupation: Solicitor: [employer]  Tobacco Use   Smoking status: Never   Smokeless tobacco: Never  Vaping Use    Vaping Use: Never used  Substance and Sexual Activity   Alcohol use: Yes    Alcohol/week: 0.0 standard drinks of alcohol    Comment: 2 beers monthly   Drug use: No   Sexual activity: Not on file  Other Topics Concern   Not on file  Social History Narrative   Not on file   Social Determinants of Health   Financial Resource Strain: Low Risk  (11/16/2021)   Overall Financial Resource Strain (CARDIA)    Difficulty of Paying  Living Expenses: Not hard at all  Food Insecurity: No Food Insecurity (11/16/2021)   Hunger Vital Sign    Worried About Running Out of Food in the Last Year: Never true    Ran Out of Food in the Last Year: Never true  Transportation Needs: No Transportation Needs (11/16/2021)   PRAPARE - Hydrologist (Medical): No    Lack of Transportation (Non-Medical): No  Physical Activity: Sufficiently Active (11/16/2021)   Exercise Vital Sign    Days of Exercise per Week: 2 days    Minutes of Exercise per Session: 150+ min  Stress: No Stress Concern Present (11/16/2021)   [place]    Feeling of Stress : Not at all  Social Connections: Moderately Isolated (11/16/2021)   Social Connection and Isolation Panel [NHANES]    Frequency of Communication with Friends and Family: More than three times a week    Frequency of Social Gatherings with Friends and Family: More than three times a week    Attends Religious Services: Never    Marine scientist or Organizations: No    Attends Music therapist: Never    Marital Status: Married    Tobacco Counseling Counseling given: Not Answered   Clinical Intake:  Pre-visit preparation completed: No  Pain : No/denies pain     BMI - recorded: 32.51 Nutritional Status: BMI > 30  Obese Nutritional Risks: None Diabetes: No  How often do you need to have someone help you when you read instructions, pamphlets, or other written  materials from your doctor or pharmacy?: 1 - Never  Diabetic?  No  Interpreter Needed?: No  Information entered by :: Rolene Arbour LPN   Activities of Daily Living    11/16/2021    8:27 AM  In your present state of health, do you have any difficulty performing the following activities:  Hearing? 0  Vision? 0  Difficulty concentrating or making decisions? 0  Walking or climbing stairs? 0  Dressing or bathing? 0  Doing errands, shopping? 0  Preparing Food and eating ? N  Using the Toilet? N  In the past six months, have you accidently leaked urine? N  Do you have problems with loss of bowel control? N  Managing your Medications? N  Managing your Finances? N  Housekeeping or managing your Housekeeping? N    Patient Care Team: Laurey Morale, MD as PCP - General (Family Medicine) Jettie Booze, MD as PCP - Cardiology (Cardiology)  Indicate any recent Medical Services you may have received from other than [facility] providers in the past year (date may be approximate).     Assessment:   This is a routine wellness examination for Jorey.  Hearing/Vision screen Hearing Screening - Comments:: No hearing difficulty Vision Screening - Comments:: No vision difficulty  Dietary issues and exercise activities discussed: Exercise limited by: None identified   Goals Addressed   None    Depression Screen    11/16/2021    8:25 AM 10/12/2021   10:29 AM 07/26/2021    3:05 PM 04/19/2021    4:44 PM 11/03/2020    8:11 AM 10/12/2020    1:17 PM  PHQ 2/9 Scores  PHQ - 2 Score 0 0 2 0 0 0  PHQ- 9 Score 0 1 8 4  1     Fall Risk    11/16/2021    8:28 AM 10/12/2021   10:30 AM 10/11/2021  11:09 AM 07/26/2021    3:06 PM 04/19/2021    4:44 PM  Fall Risk   Falls in the past year? 0 0 0 0 0  Number falls in past yr: 0 0  0   Injury with Fall? 0 0  0   Risk for fall due to : No Fall Risks No Fall Risks  No Fall Risks   Follow up  Falls evaluation completed  Falls evaluation completed      [place]:  Any stairs in or around the home? Yes  If so, are there any without handrails? No  Home free of loose throw rugs in walkways, pet beds, electrical cords, etc? Yes  Adequate lighting in your home to reduce risk of falls? Yes   ASSISTIVE DEVICES UTILIZED TO PREVENT FALLS:  Life alert? No  Use of a cane, walker or w/c? No  Grab bars in the bathroom? No  Shower chair or bench in shower? No  Elevated toilet seat or a handicapped toilet? No   TIMED UP AND GO:  Was the test performed? No . Audio Visit  Cognitive Function:        11/16/2021    8:29 AM 11/03/2020    8:14 AM  6CIT Screen  What Year? 0 points 0 points  What month? 0 points 0 points  What time? 0 points 0 points  Count back from 20 0 points 0 points  Months in reverse 0 points 0 points  Repeat phrase 0 points 0 points  Total Score 0 points 0 points    Immunizations Immunization History  Administered Date(s) Administered   Fluad Quad(high Dose 65+) 04/08/2020   Influenza,inj,Quad PF,6+ Mos 02/20/2013, 12/03/2013, 02/16/2015, 03/01/2016   PFIZER(Purple Top)SARS-COV-2 Vaccination 07/03/2019, 07/24/2019, 04/08/2020, 06/02/2020    TDAP status: Due, Education has been provided regarding the importance of this vaccine. Advised may receive this vaccine at local pharmacy or Health Dept. Aware to provide a copy of the vaccination record if obtained from local pharmacy or Health Dept. Verbalized acceptance and understanding.  Flu Vaccine status: Up to date  Pneumococcal vaccine status: Declined,  Education has been provided regarding the importance of this vaccine but patient still declined. Advised may receive this vaccine at local pharmacy or Health Dept. Aware to provide a copy of the vaccination record if obtained from local pharmacy or Health Dept. Verbalized acceptance and understanding.   Covid-19 vaccine status: Completed vaccines  Qualifies for Shingles Vaccine?  Yes   Zostavax completed No   Shingrix Completed?: No.    Education has been provided regarding the importance of this vaccine. Patient has been advised to call insurance company to determine out of pocket expense if they have not yet received this vaccine. Advised may also receive vaccine at local pharmacy or Health Dept. Verbalized acceptance and understanding.  Screening Tests Health Maintenance  Topic Date Due   INFLUENZA VACCINE  11/08/2021   COVID-19 Vaccine (5 - Booster for Pfizer series) 12/02/2021 (Originally 07/28/2020)   Zoster Vaccines- Shingrix (1 of 2) 02/16/2022 (Originally 07/23/1974)   Pneumonia Vaccine 11+ Years old (1 - PCV) 11/17/2022 (Originally 07/24/2019)   TETANUS/TDAP  11/17/2022 (Originally 07/23/1974)   COLONOSCOPY (Pts 45-101yr Insurance coverage will need to be confirmed)  11/20/2023   Hepatitis C Screening  Completed   HPV VACCINES  Aged Out    Health Maintenance  Health Maintenance Due  Topic Date Due   INFLUENZA VACCINE  11/08/2021    Colorectal cancer screening:  Type of screening: Colonoscopy. Completed 11/19/20. Repeat every 3 years  Lung Cancer Screening: (Low Dose CT Chest recommended if Age 88-80 years, 30 pack-year currently smoking OR have quit w/in 15years.) does not qualify.     Additional Screening:  Hepatitis C Screening: does qualify; Completed 4/1/2   Vision Screening: Recommended annual ophthalmology exams for early detection of glaucoma and other disorders of the eye. Is the patient up to date with their annual eye exam?  Yes  Who is the provider or what is the name of the office in which the patient attends annual eye exams? Dr Herbert Deaner If pt is not established with a provider, would they like to be referred to a provider to establish care? No .   Dental Screening: Recommended annual dental exams for proper oral hygiene  Community Resource Referral / Chronic Care Management:  CRR required this visit?  No   CCM required this visit?   No      Plan:     I have personally reviewed and noted the following in the patient's chart:   Medical and social history Use of alcohol, tobacco or illicit drugs  Current medications and supplements including opioid prescriptions. Patient is not currently taking opioid prescriptions. Functional ability and status Nutritional status Physical activity Advanced directives List of other physicians Hospitalizations, surgeries, and ER visits in previous 12 months Vitals Screenings to include cognitive, depression, and falls Referrals and appointments  In addition, I have reviewed and discussed with patient certain preventive protocols, quality metrics, and best practice recommendations. A written personalized care plan for preventive services as well as general preventive health recommendations were provided to patient.     Criselda Peaches, LPN   12/10/1192   Nurse Notes: None

## 2021-11-16 NOTE — Patient Instructions (Addendum)
[name] , Thank you for taking time to come for your Medicare Wellness Visit. I appreciate your ongoing commitment to your health goals. Please review the following plan we discussed and let me know if I can assist you in the future.   These are the goals we discussed:  Goals      Patient Stated     None at this time        This is a list of the screening recommended for you and due dates:  Health Maintenance  Topic Date Due   Flu Shot  11/08/2021   COVID-19 Vaccine (5 - Booster for Pfizer series) 12/02/2021*   Zoster (Shingles) Vaccine (1 of 2) 02/16/2022*   Pneumonia Vaccine (1 - PCV) 11/17/2022*   Tetanus Vaccine  11/17/2022*   Colon Cancer Screening  11/20/2023   Hepatitis C Screening: USPSTF Recommendation to screen - Ages 18-79 yo.  Completed   HPV Vaccine  Aged Out  *Topic was postponed. The date shown is not the original due date.     Advanced directives: Yes  Conditions/risks identified: None  Next appointment: Follow up in one year for your annual wellness visit.    Preventive Care 67 Years and Older, Male Preventive care refers to lifestyle choices and visits with your health care provider that can promote health and wellness. What does preventive care include? A yearly physical exam. This is also called an annual well check. Dental exams once or twice a year. Routine eye exams. Ask your health care provider how often you should have your eyes checked. Personal lifestyle choices, including: Daily care of your teeth and gums. Regular physical activity. Eating a healthy diet. Avoiding tobacco and drug use. Limiting alcohol use. Practicing safe sex. Taking low doses of aspirin every day. Taking vitamin and mineral supplements as recommended by your health care provider. What happens during an annual well check? The services and screenings done by your health care provider during your annual well check will depend on your age, overall health, lifestyle risk  factors, and family history of disease. Counseling  Your health care provider may ask you questions about your: Alcohol use. Tobacco use. Drug use. Emotional well-being. Home and relationship well-being. Sexual activity. Eating habits. History of falls. Memory and ability to understand (cognition). Work and work Statistician. Screening  You may have the following tests or measurements: Height, weight, and BMI. Blood pressure. Lipid and cholesterol levels. These may be checked every 5 years, or more frequently if you are over 91 years old. Skin check. Lung cancer screening. You may have this screening every year starting at age 73 if you have a 30-pack-year history of smoking and currently smoke or have quit within the past 15 years. Fecal occult blood test (FOBT) of the stool. You may have this test every year starting at age 8. Flexible sigmoidoscopy or colonoscopy. You may have a sigmoidoscopy every 5 years or a colonoscopy every 10 years starting at age 71. Prostate cancer screening. Recommendations will vary depending on your family history and other risks. Hepatitis C blood test. Hepatitis B blood test. Sexually transmitted disease (STD) testing. Diabetes screening. This is done by checking your blood sugar (glucose) after you have not eaten for a while (fasting). You may have this done every 1-3 years. Abdominal aortic aneurysm (AAA) screening. You may need this if you are a current or former smoker. Osteoporosis. You may be screened starting at age 27 if you are at high risk. Talk with your  health care provider about your test results, treatment options, and if necessary, the need for more tests. Vaccines  Your health care provider may recommend certain vaccines, such as: Influenza vaccine. This is recommended every year. Tetanus, diphtheria, and acellular pertussis (Tdap, Td) vaccine. You may need a Td booster every 10 years. Zoster vaccine. You may need this after age  19. Pneumococcal 13-valent conjugate (PCV13) vaccine. One dose is recommended after age 2. Pneumococcal polysaccharide (PPSV23) vaccine. One dose is recommended after age 50. Talk to your health care provider about which screenings and vaccines you need and how often you need them. This information is not intended to replace advice given to you by your health care provider. Make sure you discuss any questions you have with your health care provider. Document Released: 04/23/2015 Document Revised: 12/15/2015 Document Reviewed: 01/26/2015 Elsevier Interactive Patient Education  2017 [place] Prevention in the Home Falls can cause injuries. They can happen to people of all ages. There are many things you can do to make your home safe and to help prevent falls. What can I do on the outside of my home? Regularly fix the edges of walkways and driveways and fix any cracks. Remove anything that might make you trip as you walk through a door, such as a raised step or threshold. Trim any bushes or trees on the path to your home. Use bright outdoor lighting. Clear any walking paths of anything that might make someone trip, such as rocks or tools. Regularly check to see if handrails are loose or broken. Make sure that both sides of any steps have handrails. Any raised decks and porches should have guardrails on the edges. Have any leaves, snow, or ice cleared regularly. Use sand or salt on walking paths during winter. Clean up any spills in your garage right away. This includes oil or grease spills. What can I do in the bathroom? Use night lights. Install grab bars by the toilet and in the tub and shower. Do not use towel bars as grab bars. Use non-skid mats or decals in the tub or shower. If you need to sit down in the shower, use a plastic, non-slip stool. Keep the floor dry. Clean up any water that spills on the floor as soon as it happens. Remove soap buildup in the tub or shower  regularly. Attach bath mats securely with double-sided non-slip rug tape. Do not have throw rugs and other things on the floor that can make you trip. What can I do in the bedroom? Use night lights. Make sure that you have a light by your bed that is easy to reach. Do not use any sheets or blankets that are too big for your bed. They should not hang down onto the floor. Have a firm chair that has side arms. You can use this for support while you get dressed. Do not have throw rugs and other things on the floor that can make you trip. What can I do in the kitchen? Clean up any spills right away. Avoid walking on wet floors. Keep items that you use a lot in easy-to-reach places. If you need to reach something above you, use a strong step stool that has a grab bar. Keep electrical cords out of the way. Do not use floor polish or wax that makes floors slippery. If you must use wax, use non-skid floor wax. Do not have throw rugs and other things on the floor that can make you trip. What  can I do with my stairs? Do not leave any items on the stairs. Make sure that there are handrails on both sides of the stairs and use them. Fix handrails that are broken or loose. Make sure that handrails are as long as the stairways. Check any carpeting to make sure that it is firmly attached to the stairs. Fix any carpet that is loose or worn. Avoid having throw rugs at the top or bottom of the stairs. If you do have throw rugs, attach them to the floor with carpet tape. Make sure that you have a light switch at the top of the stairs and the bottom of the stairs. If you do not have them, ask someone to add them for you. What else can I do to help prevent falls? Wear shoes that: Do not have high heels. Have rubber bottoms. Are comfortable and fit you well. Are closed at the toe. Do not wear sandals. If you use a stepladder: Make sure that it is fully opened. Do not climb a closed stepladder. Make sure that  both sides of the stepladder are locked into place. Ask someone to hold it for you, if possible. Clearly mark and make sure that you can see: Any grab bars or handrails. First and last steps. Where the edge of each step is. Use tools that help you move around (mobility aids) if they are needed. These include: Canes. Walkers. Scooters. Crutches. Turn on the lights when you go into a dark area. Replace any light bulbs as soon as they burn out. Set up your furniture so you have a clear path. Avoid moving your furniture around. If any of your floors are uneven, fix them. If there are any pets around you, be aware of where they are. Review your medicines with your doctor. Some medicines can make you feel dizzy. This can increase your chance of falling. Ask your doctor what other things that you can do to help prevent falls. This information is not intended to replace advice given to you by your health care provider. Make sure you discuss any questions you have with your health care provider. Document Released: 01/21/2009 Document Revised: 09/02/2015 Document Reviewed: 05/01/2014 Elsevier Interactive Patient Education  2017 [employer].

## 2021-12-15 ENCOUNTER — Encounter: Payer: Self-pay | Admitting: Family Medicine

## 2021-12-19 ENCOUNTER — Ambulatory Visit (INDEPENDENT_AMBULATORY_CARE_PROVIDER_SITE_OTHER): Payer: Medicare Other | Admitting: Family Medicine

## 2021-12-19 ENCOUNTER — Encounter: Payer: Self-pay | Admitting: Family Medicine

## 2021-12-19 VITALS — BP 126/78 | HR 67 | Temp 98.3°F | Wt 236.0 lb

## 2021-12-19 DIAGNOSIS — J069 Acute upper respiratory infection, unspecified: Secondary | ICD-10-CM | POA: Diagnosis not present

## 2021-12-19 DIAGNOSIS — H6983 Other specified disorders of Eustachian tube, bilateral: Secondary | ICD-10-CM | POA: Diagnosis not present

## 2021-12-19 DIAGNOSIS — L578 Other skin changes due to chronic exposure to nonionizing radiation: Secondary | ICD-10-CM | POA: Diagnosis not present

## 2021-12-19 DIAGNOSIS — R059 Cough, unspecified: Secondary | ICD-10-CM | POA: Diagnosis not present

## 2021-12-19 LAB — POCT INFLUENZA A/B
Influenza A, POC: NEGATIVE
Influenza B, POC: NEGATIVE

## 2021-12-19 LAB — POC COVID19 BINAXNOW: SARS Coronavirus 2 Ag: NEGATIVE

## 2021-12-19 MED ORDER — METHYLPREDNISOLONE 4 MG PO TBPK: ORAL_TABLET | ORAL | 2 refills | Status: DC

## 2021-12-19 NOTE — Progress Notes (Signed)
   Subjective:    Patient ID: [name] [name], male    DOB: 1956-02-09, 66 y.o.   MRN: [id number]  HPI Here for several issues. First about 2 weeks ago he developed body aches, a mild headache, and congestion in the nose and the ears. No fever or ST or cough. He felt bad the first day and then improved rapidly. He thinks he is over this now except he has had pressure in both ears ever since. No ear pain. He has tried cleaning them out with Debrox, but this has not helped. Second he asks me to check some skin lesions on both forearms. These have slowly developed over the past year or so.    Review of Systems  Constitutional: Negative.   HENT:  Positive for congestion, postnasal drip and sinus pressure. Negative for ear pain, hearing loss, sneezing and sore throat.   Eyes: Negative.   Respiratory: Negative.    Cardiovascular: Negative.   Skin:  Positive for rash.       Objective:   Physical Exam Constitutional:      Appearance: Normal appearance. He is not ill-appearing.  HENT:     Right Ear: Tympanic membrane, ear canal and external ear normal. There is no impacted cerumen.     Left Ear: Tympanic membrane, ear canal and external ear normal. There is no impacted cerumen.     Nose: Nose normal.     Mouth/Throat:     Pharynx: Oropharynx is clear.  Eyes:     Conjunctiva/sclera: Conjunctivae normal.  Cardiovascular:     Heart sounds: No murmur heard. Pulmonary:     Effort: Pulmonary effort is normal.     Breath sounds: Normal breath sounds.  Lymphadenopathy:     Cervical: No cervical adenopathy.  Skin:    Comments: Scattered pink and red scaly areas on both dorsal forearms   Neurological:     Mental Status: He is alert.           Assessment & Plan:  First he is recovering from a viral URI. He can rest and drink fluids. Second he has eustachian tube dysfunction, so he can try taking either Zyrtec D or Mucinex as needed. Third he has actinic damage to his skin, so we will refer  him to Dermatology to evaluate.  Alysia Penna, MD

## 2021-12-23 ENCOUNTER — Encounter: Payer: Self-pay | Admitting: *Deleted

## 2021-12-23 ENCOUNTER — Telehealth: Payer: Self-pay | Admitting: *Deleted

## 2021-12-23 ENCOUNTER — Telehealth: Payer: Self-pay | Admitting: Licensed Clinical Social Worker

## 2021-12-23 NOTE — Patient Instructions (Signed)
Visit Information  Thank you for taking time to visit with me today. Please don't hesitate to contact me if I can be of assistance to you.   Following are the goals we discussed today:   Goals Addressed               This Visit's Progress     COMPLETED: No needs (pt-stated)        Care Coordination Interventions: Reviewed medications with patient and discussed adherence to medication with no needs Reviewed scheduled/upcoming provider appointments including pending appointments and verified AWV completed on 11/16/2021  Assessed social determinant of health barriers         Please call the care guide team at [phone] if you need to cancel or reschedule your appointment.   If you are experiencing a Mental Health or [place] or need someone to talk to, please call the Suicide and Crisis Lifeline: 988  Patient verbalizes understanding of instructions and care plan provided today and agrees to view in [place]. Active MyChart status and patient understanding of how to access instructions and care plan via MyChart confirmed with patient.     No further follow up required: No needs  Raina Mina, RN Care Management Coordinator [place] Office [phone]

## 2021-12-23 NOTE — Patient Outreach (Signed)
  Care Coordination   Initial Visit Note   12/23/2021 Name: [name] [name] MRN: [id number] DOB: 02/17/1956  [name] [name] is a 66 y.o. year old male who sees Laurey Morale, MD for primary care. I spoke with  Darlina Guys by phone today.  What matters to the patients health and wellness today?  No needs    Goals Addressed               This Visit's Progress     COMPLETED: No needs (pt-stated)        Care Coordination Interventions: Reviewed medications with patient and discussed adherence to medication with no needs Reviewed scheduled/upcoming provider appointments including pending appointments and verified AWV completed on 11/16/2021  Assessed social determinant of health barriers          SDOH assessments and interventions completed:  Yes  SDOH Interventions Today    Flowsheet Row Most Recent Value  SDOH Interventions   Transportation Interventions Intervention Not Indicated        Care Coordination Interventions Activated:  Yes  Care Coordination Interventions:  Yes, provided   Follow up plan: No further intervention required.   Encounter Outcome:  Pt. Visit Completed   Raina Mina, RN Care Management Coordinator [place] Office [phone]

## 2021-12-23 NOTE — Patient Outreach (Signed)
  Care Coordination   Initial Visit Note   12/23/2021 Name: DEMIR TITSWORTH MRN: [id number] DOB: 1955/10/29  DEVYNN SCHEFF is a 66 y.o. year old male who sees Laurey Morale, MD for primary care. I spoke with  Darlina Guys by phone today.  What matters to the patients health and wellness today?  Care Coordination Services    Goals Addressed             This Visit's Progress    COMPLETED: Care Coordination Activities-No Follow Up Required       Care Coordination Interventions: Active listening / Reflection utilized  Emotional Support Provided  LCSW informed patient of care coordination services. Pt is not interested at this time and agreed to contact PCP, should needs arise  Patient reports management of physical and mental health conditions Pt has not heard from Dermatologist. He agreed to contact PCP office within 2 weeks, should he not hear from referral        SDOH assessments and interventions completed:  No     Care Coordination Interventions Activated:  Yes  Care Coordination Interventions:  Yes, provided   Follow up plan: No further intervention required.   Encounter Outcome:  Pt. Refused   Christa See, [email or web address], [email or web address] Phone [phone] 2:21 PM

## 2022-01-05 ENCOUNTER — Encounter: Payer: Self-pay | Admitting: Family Medicine

## 2022-01-06 NOTE — Telephone Encounter (Signed)
When I saw him on 12-19-21 I wrote for a Medrol dose pack. I gave him 2 refills. I suggest he get this refilled and take another dose pack

## 2022-01-12 ENCOUNTER — Encounter: Payer: Self-pay | Admitting: Family Medicine

## 2022-01-12 DIAGNOSIS — H6993 Unspecified Eustachian tube disorder, bilateral: Secondary | ICD-10-CM

## 2022-01-13 NOTE — Telephone Encounter (Signed)
I cannot think of anything else to try. I have referred him to ENT. I am sure they can help

## 2022-01-20 ENCOUNTER — Ambulatory Visit: Payer: Medicare Other | Admitting: Interventional Cardiology

## 2022-02-20 ENCOUNTER — Ambulatory Visit: Payer: Medicare Other | Admitting: Interventional Cardiology

## 2022-02-28 ENCOUNTER — Other Ambulatory Visit: Payer: Self-pay | Admitting: Family Medicine

## 2022-03-13 ENCOUNTER — Encounter: Payer: Self-pay | Admitting: Family Medicine

## 2022-03-13 ENCOUNTER — Ambulatory Visit (INDEPENDENT_AMBULATORY_CARE_PROVIDER_SITE_OTHER): Payer: Medicare Other | Admitting: Family Medicine

## 2022-03-13 VITALS — BP 138/76 | HR 77 | Temp 97.8°F | Wt 236.0 lb

## 2022-03-13 DIAGNOSIS — N179 Acute kidney failure, unspecified: Secondary | ICD-10-CM | POA: Diagnosis not present

## 2022-03-13 DIAGNOSIS — I10 Essential (primary) hypertension: Secondary | ICD-10-CM

## 2022-03-13 DIAGNOSIS — N1832 Chronic kidney disease, stage 3b: Secondary | ICD-10-CM

## 2022-03-13 DIAGNOSIS — R21 Rash and other nonspecific skin eruption: Secondary | ICD-10-CM | POA: Diagnosis not present

## 2022-03-13 DIAGNOSIS — C49A3 Gastrointestinal stromal tumor of small intestine: Secondary | ICD-10-CM | POA: Diagnosis not present

## 2022-03-13 LAB — CBC WITH DIFFERENTIAL/PLATELET
Basophils Absolute: 0.1 10*3/uL (ref 0.0–0.1)
Basophils Relative: 1 % (ref 0.0–3.0)
Eosinophils Absolute: 0.2 10*3/uL (ref 0.0–0.7)
Eosinophils Relative: 3.5 % (ref 0.0–5.0)
HCT: 40.9 % (ref 39.0–52.0)
Hemoglobin: 14 g/dL (ref 13.0–17.0)
Lymphocytes Relative: 11.9 % — ABNORMAL LOW (ref 12.0–46.0)
Lymphs Abs: 0.7 10*3/uL (ref 0.7–4.0)
MCHC: 34.4 g/dL (ref 30.0–36.0)
MCV: 85.3 fl (ref 78.0–100.0)
Monocytes Absolute: 0.6 10*3/uL (ref 0.1–1.0)
Monocytes Relative: 9.6 % (ref 3.0–12.0)
Neutro Abs: 4.5 10*3/uL (ref 1.4–7.7)
Neutrophils Relative %: 74 % (ref 43.0–77.0)
Platelets: 169 10*3/uL (ref 150.0–400.0)
RBC: 4.8 Mil/uL (ref 4.22–5.81)
RDW: 15.4 % (ref 11.5–15.5)
WBC: 6.1 10*3/uL (ref 4.0–10.5)

## 2022-03-13 LAB — BASIC METABOLIC PANEL
BUN: 27 mg/dL — ABNORMAL HIGH (ref 6–23)
CO2: 22 mEq/L (ref 19–32)
Calcium: 8.8 mg/dL (ref 8.4–10.5)
Chloride: 106 mEq/L (ref 96–112)
Creatinine, Ser: 1.83 mg/dL — ABNORMAL HIGH (ref 0.40–1.50)
GFR: 37.68 mL/min — ABNORMAL LOW (ref >60.00)
Glucose, Bld: 93 mg/dL (ref 70–99)
Potassium: 4 mEq/L (ref 3.5–5.1)
Sodium: 136 mEq/L (ref 135–145)

## 2022-03-13 NOTE — Progress Notes (Signed)
   Subjective:    Patient ID: [name] [name], male    DOB: 04-Feb-1956, 66 y.o.   MRN: [id number]  HPI Here to follow up after he saw his oncologist at [facility], Dr. Acey Lav, on 03-09-22. 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 His Hgb was stable at 12.6. [name] [name] had also developed a red itchy rash all over his body, and this was attributed to the [place] as well. They agreed to hold the [place] for 3 weeks, and he will see Dr. Angelina Ok again on 03-30-22. He has been treating the rash with moisturizer lotion and Triamcinolone cream, and this has improved quite a bit. His appetite is good. Dr. Angelina Ok has also stopped the Lisinopril HCT, but his BP has been stable.    Review of Systems  Constitutional: Negative.   Respiratory: Negative.    Cardiovascular: Negative.   Gastrointestinal: Negative.   Genitourinary: Negative.   Skin:  Positive for rash.       Objective:   Physical Exam Constitutional:      Appearance: Normal appearance. He is not ill-appearing.  Cardiovascular:     Rate and Rhythm: Normal rate and regular rhythm.     Pulses: Normal pulses.     Heart sounds: Normal heart sounds.  Pulmonary:     Effort: Pulmonary effort is normal.     Breath sounds: Normal breath sounds.  Neurological:     Mental Status: He is alert.           Assessment & Plan:  He is S/P removal of a GIST tumor, and he has been taking adjuvant Gleevec since then. This is being held because it has apparently caused some renal insufficiency and a rash. We will check a CBC and BMET today. His Lisinopril HCT has already been stopped, and so far his BP remains controlled. I also recommended he avoid taking NSAIDs and to use Tylenol instead. We spent a total of ( 31  ) minutes reviewing records and discussing these issues.  Alysia Penna, MD

## 2022-03-15 NOTE — Addendum Note (Signed)
Addended by: Alysia Penna A on: 03/15/2022 12:52 PM   Modules accepted: Orders

## 2022-04-17 LAB — LAB REPORT - SCANNED
Creatinine, POC: 66 mg/dL
EGFR: 34
HM HIV Screening: NEGATIVE
HM Hepatitis Screen: NEGATIVE
Protein/Creatinine Ratio: 3253

## 2022-05-18 NOTE — Progress Notes (Signed)
Cardiology Office Note   Date:  05/19/2022   ID:  Demaj, [place] [birth year], MRN [id number]  PCP:  Laurey Morale, MD    No chief complaint on file.  Aortic atherosclerosis  Wt Readings from Last 3 Encounters:  05/19/22 234 lb (106.1 kg)  03/13/22 236 lb (107 kg)  12/19/21 236 lb (107 kg)       History of Present Illness: [name] [name] is a 67 y.o. male   who is being seen today for the evaluation of aortic atherosclerosis at the request of Laurey Morale, MD.   Abdominal pain from kidney stone led to CT scan showing: "Diffuse aortic and SMA atherosclerosis."   He has a h/o HTN nad hyperlipidemia:  BP meds needed since 2015.  Crestor started in 2021.    No early CAD in the family.  No revascularization.  Brothers are healthy.  Sister died 15 years ago, noncardiac cause of death.   No tobacco use.     He plays tennis regularly without problems.  He was surprised when he found that he had plaque.  He had a gout flare.  In 11/2020, had GI bleeding and required 9 U PRBC.   Had recurrence in 03/2021- 17 U PRBC transfusion.  Stopped after 10 days.    At [facility], he had a Gist tumor removed.  Had surgery and took [place].   June 2023, had cholecystectomy  11/23: had some kidney issues. Cr was up> 2, but now down to 1.8 in 12/23.  Gleevec was stopped for kidney reasons.   Denies : Chest pain. Dizziness. Leg edema. Nitroglycerin use. Orthopnea. Palpitations. Paroxysmal nocturnal dyspnea. Shortness of breath. Syncope.    Past Medical History:  Diagnosis Date   Ankle fracture    Cancer ([place]) 1974   testicular cancer-at age 20   Cataract    bilateral sx   GERD (gastroesophageal reflux disease)    hx of   Hx of colonic polyps    pt unsure,thinks this was done in 1982 in [place]. no reports in EPIC   Hyperlipidemia    on meds   Hypertension    on meds   Kidney stones    Patella fracture    Retinal detachment    OD   Retinal tear of right eye    Syncopal episodes 2008     Past Surgical History:  Procedure Laterality Date   AIR/FLUID EXCHANGE Right 06/16/2014   Procedure: AIR/FLUID EXCHANGE;  Surgeon: Hayden Pedro, MD;  Location: [place];  Service: Ophthalmology;  Laterality: Right;   BIOPSY  04/04/2021   Procedure: BIOPSY;  Surgeon: Rush Landmark Telford Nab., MD;  Location: [place];  Service: Gastroenterology;;   CATARACT EXTRACTION     CATARACT EXTRACTION  09/2014   rt eye//left 01/2015   CATARACT EXTRACTION, BILATERAL  2016   per Dr. Herbert Deaner    COLONOSCOPY  02/16/2020   per Dr. Hilarie Fredrickson, adenomatous polyps, repeat in 3 yrs   ENTEROSCOPY N/A 04/04/2021   Procedure: ENTEROSCOPY;  Surgeon: Mansouraty, Telford Nab., MD;  Location: [place];  Service: Gastroenterology;  Laterality: N/A;   EYE SURGERY     GAS INSERTION Right 06/16/2014   Procedure: INSERTION OF GAS;  Surgeon: Hayden Pedro, MD;  Location: [place];  Service: Ophthalmology;  Laterality: Right;  [id number]   GIVENS CAPSULE STUDY N/A 04/02/2021   Procedure: GIVENS CAPSULE STUDY;  Surgeon: [name]., MD;  Location: [place];  Service: Gastroenterology;  Laterality: N/A;  HYDROCELE EXCISION     IR ANGIOGRAM VISCERAL SELECTIVE  04/04/2021   IR US GUIDE VASC ACCESS LEFT  04/04/2021   KNEE ARTHROSCOPY Right    x 2   LASER PHOTO ABLATION Right 06/16/2014   Procedure: LASER PHOTO ABLATION;  Surgeon: Hayden Pedro, MD;  Location: [place];  Service: Ophthalmology;  Laterality: Right;  Headscope laser and endolaser    ORCHIECTOMY     right testicle   REPAIR OF COMPLEX TRACTION RETINAL DETACHMENT Right 06/16/2014   Procedure: REPAIR OF COMPLEX TRACTION RETINAL DETACHMENT;  Surgeon: Hayden Pedro, MD;  Location: [place];  Service: Ophthalmology;  Laterality: Right;   RETINAL DETACHMENT SURGERY  06/2014   SUBMUCOSAL TATTOO INJECTION  04/04/2021   Procedure: SUBMUCOSAL TATTOO INJECTION;  Surgeon: [name]., MD;  Location: [place];  Service: Gastroenterology;;    TONSILLECTOMY     VITRECTOMY Right 06/16/2014   WISDOM TOOTH EXTRACTION       Current Outpatient Medications  Medication Sig Dispense Refill   amLODipine (NORVASC) 10 MG tablet Take 1 tablet (10 mg total) by mouth daily. 90 tablet 3   Iron, Ferrous Sulfate, 325 (65 Fe) MG TABS Take 325 mg by mouth 2 (two) times daily with a meal. 60 tablet 5   lisinopril-hydrochlorothiazide (ZESTORETIC) 20-12.5 MG tablet Take 1 tablet by mouth daily. 90 tablet 3   rosuvastatin (CRESTOR) 40 MG tablet TAKE 1 TABLET BY MOUTH EVERY DAY 90 tablet 0   imatinib (GLEEVEC) 400 MG tablet Take by mouth. (Patient not taking: Reported on 03/13/2022)     methylPREDNISolone (MEDROL DOSEPAK) 4 MG TBPK tablet As directed (Patient not taking: Reported on 05/19/2022) 21 tablet 2   pantoprazole (PROTONIX) 40 MG tablet Take 40 mg by mouth 2 (two) times daily. (Patient not taking: Reported on 05/19/2022)     No current facility-administered medications for this visit.    Allergies:   Patient has no known allergies.    Social History:  The patient  reports that he has never smoked. He has never used smokeless tobacco. He reports current alcohol use. He reports that he does not use drugs.   Family History:  The patient's family history includes Arrhythmia in his father; Coronary artery disease in an other family member; Hypertension in an other family member; Parkinson's disease in his mother; Sudden death in an other family member.    ROS:  Please see the history of present illness.   Otherwise, review of systems are positive for mild DOE- stable.   All other systems are reviewed and negative.    PHYSICAL EXAM: VS:  BP 124/74   Pulse 74   Ht 5' 11"$  (1.803 m)   Wt 234 lb (106.1 kg)   SpO2 98%   BMI 32.64 kg/m  , BMI Body mass index is 32.64 kg/m. GEN: Well nourished, well developed, in no acute distress HEENT: normal Neck: no JVD, carotid bruits, or masses Cardiac: RRR; no murmurs, rubs, or gallops,no edema   Respiratory:  clear to auscultation bilaterally, normal work of breathing GI: soft, nontender, nondistended, + BS MS: no deformity or atrophy Skin: warm and dry, no rash Neuro:  Strength and sensation are intact Psych: euthymic mood, full affect   EKG:   The ekg ordered today demonstrates NSR, no ST changes   Recent Labs: 10/12/2021: ALT 83 03/13/2022: BUN 27; Creatinine, Ser 1.83; Hemoglobin 14.0; Platelets 169.0; Potassium 4.0; Sodium 136   Lipid Panel    Component Value Date/Time   CHOL  91 10/07/2020 1119   CHOL 88 (L) 03/16/2020 0800   TRIG 104.0 10/07/2020 1119   HDL 33.00 (L) 10/07/2020 1119   HDL 35 (L) 03/16/2020 0800   CHOLHDL 3 10/07/2020 1119   VLDL 20.8 10/07/2020 1119   LDLCALC 38 10/07/2020 1119   LDLCALC 35 03/16/2020 0800   LDLDIRECT 86.8 09/27/2012 0951     Other studies Reviewed: Additional studies/ records that were reviewed today with results demonstrating: labs reviewed.   ASSESSMENT AND PLAN:  Aortic atherosclerosis:  Healthy lifestyle.  Has been through a lot since the last visit.   Coronary artery calcification: No cardiac issues during severe anemia.  Playing tennis regularly now.  No sx.  Hyperlipidemia: LDL 38 in 2022.  LDL was 40 in 2024 January.  Checked at [facility].  Continue rosuvastatin Hypertension: Low-salt diet.   Obesity: Whole food, plant-based diet.  High-fiber diet.  Avoid processed foods. CKD: avoid nephrotoxins.  Mild elevation of liver function test.  Regular blood test follow-up.  If these increase, may need to reconsider rosuvastatin dose.   Current medicines are reviewed at length with the patient today.  The patient concerns regarding his medicines were addressed.  The following changes have been made:  No change  Labs/ tests ordered today include:  No orders of the defined types were placed in this encounter.   Recommend 150 minutes/week of aerobic exercise Low fat, low carb, high fiber diet  recommended  Disposition:   FU in 1 year   Signed, Larae Grooms, MD  05/19/2022 9:44 AM    [facility] [address] [place], [place]  [address] Phone: [phone]; Fax: [phone]

## 2022-05-19 ENCOUNTER — Ambulatory Visit: Payer: Medicare Other | Attending: Interventional Cardiology | Admitting: Interventional Cardiology

## 2022-05-19 ENCOUNTER — Encounter: Payer: Self-pay | Admitting: Interventional Cardiology

## 2022-05-19 VITALS — BP 124/74 | HR 74 | Ht 71.0 in | Wt 234.0 lb

## 2022-05-19 DIAGNOSIS — E785 Hyperlipidemia, unspecified: Secondary | ICD-10-CM | POA: Diagnosis present

## 2022-05-19 DIAGNOSIS — I700 Atherosclerosis of aorta: Secondary | ICD-10-CM

## 2022-05-19 DIAGNOSIS — I10 Essential (primary) hypertension: Secondary | ICD-10-CM | POA: Diagnosis present

## 2022-05-19 DIAGNOSIS — E66811 Obesity, class 1: Secondary | ICD-10-CM

## 2022-05-19 DIAGNOSIS — E669 Obesity, unspecified: Secondary | ICD-10-CM | POA: Insufficient documentation

## 2022-05-19 NOTE — Patient Instructions (Signed)
Medication Instructions:  Your physician recommends that you continue on your current medications as directed. Please refer to the Current Medication list given to you today.  *If you need a refill on your cardiac medications before your next appointment, please call your pharmacy*   Lab Work: none If you have labs (blood work) drawn today and your tests are completely normal, you will receive your results only by: [place] (if you have MyChart) OR A paper copy in the mail If you have any lab test that is abnormal or we need to change your treatment, we will call you to review the results.   Testing/Procedures: none   Follow-Up: At [facility], you and your health needs are our priority.  As part of our continuing mission to provide you with exceptional heart care, we have created designated Provider Care Teams.  These Care Teams include your primary Cardiologist (physician) and Advanced Practice Providers (APPs -  Physician Assistants and Nurse Practitioners) who all work together to provide you with the care you need, when you need it.  We recommend signing up for the patient portal called "MyChart".  Sign up information is provided on this After Visit Summary.  MyChart is used to connect with patients for Virtual Visits (Telemedicine).  Patients are able to view lab/test results, encounter notes, upcoming appointments, etc.  Non-urgent messages can be sent to your provider as well.   To learn more about what you can do with MyChart, go to [email or web address].    Your next appointment:   12 month(s)  Provider:   Larae Grooms, MD     Other Instructions

## 2022-06-18 ENCOUNTER — Other Ambulatory Visit: Payer: Self-pay | Admitting: Family Medicine

## 2022-07-07 ENCOUNTER — Other Ambulatory Visit: Payer: Self-pay | Admitting: Family Medicine

## 2022-07-07 DIAGNOSIS — I10 Essential (primary) hypertension: Secondary | ICD-10-CM

## 2022-07-11 ENCOUNTER — Ambulatory Visit (INDEPENDENT_AMBULATORY_CARE_PROVIDER_SITE_OTHER): Payer: Medicare Other | Admitting: Family Medicine

## 2022-07-11 ENCOUNTER — Encounter: Payer: Self-pay | Admitting: Family Medicine

## 2022-07-11 VITALS — BP 126/76 | HR 59 | Temp 97.9°F | Wt 241.0 lb

## 2022-07-11 DIAGNOSIS — M25551 Pain in right hip: Secondary | ICD-10-CM | POA: Diagnosis not present

## 2022-07-11 DIAGNOSIS — I10 Essential (primary) hypertension: Secondary | ICD-10-CM | POA: Diagnosis not present

## 2022-07-11 DIAGNOSIS — N529 Male erectile dysfunction, unspecified: Secondary | ICD-10-CM | POA: Diagnosis not present

## 2022-07-11 DIAGNOSIS — R0609 Other forms of dyspnea: Secondary | ICD-10-CM

## 2022-07-11 DIAGNOSIS — G8929 Other chronic pain: Secondary | ICD-10-CM

## 2022-07-11 MED ORDER — LISINOPRIL-HYDROCHLOROTHIAZIDE 20-12.5 MG PO TABS: 1.0000 | ORAL_TABLET | Freq: Two times a day (BID) | ORAL | 0 refills | Status: DC

## 2022-07-11 MED ORDER — SILDENAFIL CITRATE 100 MG PO TABS: 100.0000 mg | ORAL_TABLET | ORAL | 5 refills | Status: DC | PRN

## 2022-07-11 NOTE — Progress Notes (Signed)
   Subjective:    Patient ID: [name] [name], male    DOB: 1955-11-10, 67 y.o.   MRN: [id number]  HPI Here for several issues. First he wants to check his BP after his neprhologist, Dr. Danie Chandler, changed his medications recently. His creatinine on 04-28-22 was 2.08 and his GFR was 34. She stopped his Amlodipine and she increased his Lisinopril HCT 20-12.5 to taking 2 tablets daily. Also he complains of some fatigue and some SOB in the past month during exercise. He is playing tennis and pickle ball 3-4 days a week, and he gets out of breath easier than her used to. He denies any ankle swelling. Third he has had right hip pain for the past year, and he has seen Orthopedics and Vascular Surgery, but no one can fis the problem. He has tried PT with no relief. He has had a cortisone shot to the hip with no benefit. Fourth he describes problems with getting and maintaining erections for the past few months. He says his desire is as strong as ever.    Review of Systems  Constitutional:  Positive for fatigue.  Respiratory:  Positive for shortness of breath. Negative for cough and wheezing.   Cardiovascular: Negative.   Gastrointestinal: Negative.   Genitourinary: Negative.   Musculoskeletal:  Positive for arthralgias.       Objective:   Physical Exam Constitutional:      Appearance: Normal appearance. He is not ill-appearing.  Cardiovascular:     Rate and Rhythm: Normal rate and regular rhythm.     Pulses: Normal pulses.     Heart sounds: Normal heart sounds.  Pulmonary:     Effort: Pulmonary effort is normal.     Breath sounds: Normal breath sounds.  Musculoskeletal:     Right lower leg: No edema.     Left lower leg: No edema.     Comments: The right hip has full ROM. He walks with no pain   Neurological:     Mental Status: He is alert.           Assessment & Plan:  His CKD and HTN are stable. His SOB may be a sign of CHF, so we will get an ECHO to evaluate for this. We will  also check a CBC , TSH, and BNP today. For the right hip pain, we will refer him to Sports Medicine to evaluate. For the ED, we wil check a testosterone level, and he will try Viagra 100 mg as needed.  Alysia Penna, MD

## 2022-07-12 LAB — CBC WITH DIFFERENTIAL/PLATELET
Basophils Absolute: 0.1 10*3/uL (ref 0.0–0.1)
Basophils Relative: 1.1 % (ref 0.0–3.0)
Eosinophils Absolute: 0.3 10*3/uL (ref 0.0–0.7)
Eosinophils Relative: 5 % (ref 0.0–5.0)
HCT: 40.1 % (ref 39.0–52.0)
Hemoglobin: 13.6 g/dL (ref 13.0–17.0)
Lymphocytes Relative: 17.9 % (ref 12.0–46.0)
Lymphs Abs: 1.2 10*3/uL (ref 0.7–4.0)
MCHC: 33.9 g/dL (ref 30.0–36.0)
MCV: 79.9 fl (ref 78.0–100.0)
Monocytes Absolute: 0.8 10*3/uL (ref 0.1–1.0)
Monocytes Relative: 11.6 % (ref 3.0–12.0)
Neutro Abs: 4.4 10*3/uL (ref 1.4–7.7)
Neutrophils Relative %: 64.4 % (ref 43.0–77.0)
Platelets: 177 10*3/uL (ref 150.0–400.0)
RBC: 5.02 Mil/uL (ref 4.22–5.81)
RDW: 14.7 % (ref 11.5–15.5)
WBC: 6.8 10*3/uL (ref 4.0–10.5)

## 2022-07-12 LAB — BRAIN NATRIURETIC PEPTIDE: Pro B Natriuretic peptide (BNP): 7 pg/mL (ref 0.0–100.0)

## 2022-07-12 LAB — TESTOSTERONE: Testosterone: 290.83 ng/dL — ABNORMAL LOW (ref 300.00–890.00)

## 2022-07-12 LAB — TSH: TSH: 1.62 u[IU]/mL (ref 0.35–5.50)

## 2022-07-13 MED ORDER — TESTOSTERONE CYPIONATE 200 MG/ML IM SOLN
200.0000 mg | INTRAMUSCULAR | 0 refills | Status: DC
Start: 2022-07-13 — End: 2022-08-28

## 2022-07-13 MED ORDER — "SYRINGE 22G X 1-1/2"" 3 ML MISC": 1.0000 | 0 refills | Status: DC

## 2022-07-13 NOTE — Addendum Note (Signed)
Addended by: Alysia Penna A on: 07/13/2022 08:11 AM   Modules accepted: Orders

## 2022-07-14 NOTE — Progress Notes (Unsigned)
    [name] [address] [place] [address] Phone: [phone]   Assessment and Plan:     There are no diagnoses linked to this encounter.  ***   Pertinent previous records reviewed include ***   Follow Up: ***     Subjective:   I, [name] [name], am serving as a Neurosurgeon for Doctor Richardean Sale  Chief Complaint: right hip pain   HPI:   07/17/2022 Patient is a 67 year old male complaining of right hip pain. Patient states  Relevant Historical Information: ***  Additional pertinent review of systems negative.   Current Outpatient Medications:    Iron, Ferrous Sulfate, 325 (65 Fe) MG TABS, Take 325 mg by mouth 2 (two) times daily with a meal. (Patient not taking: Reported on 07/11/2022), Disp: 60 tablet, Rfl: 5   lisinopril-hydrochlorothiazide (ZESTORETIC) 20-12.5 MG tablet, Take 1 tablet by mouth 2 (two) times daily., Disp: 2 tablet, Rfl: 0   rosuvastatin (CRESTOR) 40 MG tablet, TAKE 1 TABLET BY MOUTH EVERY DAY, Disp: 90 tablet, Rfl: 0   sildenafil (VIAGRA) 100 MG tablet, Take 1 tablet (100 mg total) by mouth as needed for erectile dysfunction., Disp: 10 tablet, Rfl: 5   Syringe/Needle, Disp, (SYRINGE 3CC/22GX1-1/2") 22G X 1-1/2" 3 ML MISC, 1 Application by Does not apply route every 14 (fourteen) days., Disp: 50 each, Rfl: 0   testosterone cypionate (DEPOTESTOSTERONE CYPIONATE) 200 MG/ML injection, Inject 1 mL (200 mg total) into the muscle every 14 (fourteen) days., Disp: 10 mL, Rfl: 0   Objective:     There were no vitals filed for this visit.    There is no height or weight on file to calculate BMI.    Physical Exam:    ***   Electronically signed by:  [name] 7:21 AM 07/14/22

## 2022-07-17 ENCOUNTER — Other Ambulatory Visit: Payer: Self-pay

## 2022-07-17 ENCOUNTER — Ambulatory Visit (INDEPENDENT_AMBULATORY_CARE_PROVIDER_SITE_OTHER): Payer: Medicare Other | Admitting: Sports Medicine

## 2022-07-17 ENCOUNTER — Ambulatory Visit (INDEPENDENT_AMBULATORY_CARE_PROVIDER_SITE_OTHER): Payer: Medicare Other

## 2022-07-17 VITALS — BP 118/78 | HR 80 | Ht 71.0 in | Wt 241.0 lb

## 2022-07-17 DIAGNOSIS — M25551 Pain in right hip: Secondary | ICD-10-CM | POA: Diagnosis not present

## 2022-07-17 DIAGNOSIS — M7601 Gluteal tendinitis, right hip: Secondary | ICD-10-CM | POA: Diagnosis not present

## 2022-07-17 NOTE — Patient Instructions (Signed)
Good to see you  Glute HEP 3 week follow up  

## 2022-07-24 ENCOUNTER — Encounter: Payer: Self-pay | Admitting: *Deleted

## 2022-07-31 ENCOUNTER — Ambulatory Visit (HOSPITAL_COMMUNITY)
Admission: RE | Admit: 2022-07-31 | Discharge: 2022-07-31 | Disposition: A | Discharge status: Home / Self Care | Payer: Medicare Other | Source: Ambulatory Visit | Attending: Family Medicine | Admitting: Family Medicine

## 2022-07-31 DIAGNOSIS — R0609 Other forms of dyspnea: Secondary | ICD-10-CM

## 2022-07-31 DIAGNOSIS — K219 Gastro-esophageal reflux disease without esophagitis: Secondary | ICD-10-CM | POA: Diagnosis not present

## 2022-07-31 DIAGNOSIS — E785 Hyperlipidemia, unspecified: Secondary | ICD-10-CM | POA: Insufficient documentation

## 2022-07-31 DIAGNOSIS — I371 Nonrheumatic pulmonary valve insufficiency: Secondary | ICD-10-CM | POA: Diagnosis not present

## 2022-07-31 DIAGNOSIS — I110 Hypertensive heart disease with heart failure: Secondary | ICD-10-CM | POA: Diagnosis not present

## 2022-07-31 DIAGNOSIS — I509 Heart failure, unspecified: Secondary | ICD-10-CM | POA: Diagnosis not present

## 2022-07-31 LAB — ECHOCARDIOGRAM COMPLETE
Area-P 1/2: 2.73 cm2
Calc EF: 63.5 %
S' Lateral: 2.6 cm
Single Plane A2C EF: 66.4 %
Single Plane A4C EF: 61.9 %

## 2022-07-31 NOTE — Progress Notes (Signed)
Echocardiogram 2D Echocardiogram has been performed.  [name] [name] 07/31/2022, 8:46 AM

## 2022-08-04 NOTE — Progress Notes (Signed)
[name] [name] D.[facility] [address] [place] [address] Phone: [phone]   Assessment and Plan:     1. Right hip pain 2. Gluteal tendinitis, right hip  -Chronic with exacerbation, subsequent visit - Overall improvement in gluteal pain and posterior hip pain after gluteal tendon CSI performed at previous office visit on 07/17/2022, however patient continues to have anterior hip pain.  Anterior hip pain is most consistent with hip flexor tendinopathy versus intra-articular hip pathology.  Patient has history of relatively unremarkable hip x-ray on x-ray, MRI in 2021 and essentially no benefit from intra-articular hip injection by prior provider, and pain is typically only present with weightbearing activities - Recommend avoiding NSAIDs due to recent AKI using cancer medication - Start prednisone Dosepak - Continue HEP and added hip flexor exercises - Start physical therapy.  Referral sent  Pertinent previous records reviewed include none   Follow Up: 4 weeks for reevaluation.  Could consider hip flexor CSI versus intra-articular hip CSI versus advanced imaging versus ultrasound   Subjective:   I, [name] [name], am serving as a Neurosurgeon for Doctor Richardean Sale   Chief Complaint: right hip pain    HPI:    07/17/2022 Patient is a 67 year old male complaining of right hip pain. Patient states acute on chronic right hip pain. The pain seems to be occurring over the proximal lateral portion of the hip. It is a stabbing sensation when he walked in a straight line , that would resolve when he sat . Now that he plays pickle ball and is more active he he has more constant pain but as soon as he sits down the pain gone, no meds for the pain, no numbness or tingling , no radiating pain, he endorses an antalgic gait when he is playing tennis and pickle ball. Was frustrated with PT because there was no continuity     08/07/2022 Patient states back part  of hip improved , but the front of the hip has the same level of pain that the back of the hip had      Relevant Historical Information: Hypertension, history of GIST tumor Additional pertinent review of systems negative.   Current Outpatient Medications:    Iron, Ferrous Sulfate, 325 (65 Fe) MG TABS, Take 325 mg by mouth 2 (two) times daily with a meal., Disp: 60 tablet, Rfl: 5   lisinopril-hydrochlorothiazide (ZESTORETIC) 20-12.5 MG tablet, Take 1 tablet by mouth 2 (two) times daily., Disp: 2 tablet, Rfl: 0   methylPREDNISolone (MEDROL DOSEPAK) 4 MG TBPK tablet, Take 6 tablets on day 1.  Take 5 tablets on day 2.  Take 4 tablets on day 3.  Take 3 tablets on day 4.  Take 2 tablets on day 5.  Take 1 tablet on day 6., Disp: 21 tablet, Rfl: 0   rosuvastatin (CRESTOR) 40 MG tablet, TAKE 1 TABLET BY MOUTH EVERY DAY, Disp: 90 tablet, Rfl: 0   sildenafil (VIAGRA) 100 MG tablet, Take 1 tablet (100 mg total) by mouth as needed for erectile dysfunction., Disp: 10 tablet, Rfl: 5   Syringe/Needle, Disp, (SYRINGE 3CC/22GX1-1/2") 22G X 1-1/2" 3 ML MISC, 1 Application by Does not apply route every 14 (fourteen) days., Disp: 50 each, Rfl: 0   testosterone cypionate (DEPOTESTOSTERONE CYPIONATE) 200 MG/ML injection, Inject 1 mL (200 mg total) into the muscle every 14 (fourteen) days., Disp: 10 mL, Rfl: 0   Objective:     Vitals:   08/07/22 8295  BP: 110/82  Pulse: 67  SpO2: 98%  Weight: 237 lb (107.5 kg)  Height: 5\' 11"  (1.803 m)      Body mass index is 33.05 kg/m.    Physical Exam:    General: awake, alert, and oriented no acute distress, nontoxic Skin: no suspicious lesions or rashes Neuro:sensation intact distally with no deficits, normal muscle tone, no atrophy, strength 5/5 in all tested lower ext groups Psych: normal mood and affect, speech clear   Right hip: No deformity, swelling or wasting ROM Flexion 90, ext 30, IR 45, ER 45 NTTP over the hip flexors, greater trochanter, gluteal  musculature, si joint, lumbar spine Negative log roll with FROM Negative FABER Negative FADIR Positive piriformis test for anterior hip pain without radicular symptoms Negative trendelenberg Gait normal    Electronically signed by:  [name] [name] D[facility] 9:31 AM 08/07/22

## 2022-08-05 ENCOUNTER — Other Ambulatory Visit: Payer: Self-pay | Admitting: Family Medicine

## 2022-08-07 ENCOUNTER — Ambulatory Visit (INDEPENDENT_AMBULATORY_CARE_PROVIDER_SITE_OTHER): Payer: Medicare Other | Admitting: Sports Medicine

## 2022-08-07 VITALS — BP 110/82 | HR 67 | Ht 71.0 in | Wt 237.0 lb

## 2022-08-07 DIAGNOSIS — M7601 Gluteal tendinitis, right hip: Secondary | ICD-10-CM | POA: Diagnosis not present

## 2022-08-07 DIAGNOSIS — M25551 Pain in right hip: Secondary | ICD-10-CM

## 2022-08-07 MED ORDER — METHYLPREDNISOLONE 4 MG PO TBPK: ORAL_TABLET | ORAL | 0 refills | Status: DC

## 2022-08-07 NOTE — Patient Instructions (Addendum)
Good to see you PT referral  HIP HEP  Prednisone dos pak  4 week follow up

## 2022-08-23 NOTE — Therapy (Signed)
OUTPATIENT PHYSICAL THERAPY LOWER EXTREMITY EVALUATION   Patient Name: [name] [name] MRN: [id number] DOB:1955/10/09, 67 y.o., male Today's Date: 08/29/2022   END OF SESSION:  PT End of Session - 08/29/22 0804     Visit Number 1    Date for PT Re-Evaluation 10/10/22    Authorization Type Medicare & [employer]    Progress Note Due on Visit 10    PT Start Time 0804    PT Stop Time 0848    PT Time Calculation (min) 44 min    Activity Tolerance Patient tolerated treatment well    Behavior During Therapy [facility] for tasks assessed/performed             Past Medical History:  Diagnosis Date   Ankle fracture    Cancer (HCC) 1974   testicular cancer-at age 51   Cataract    bilateral sx   GERD (gastroesophageal reflux disease)    hx of   Hx of colonic polyps    pt unsure,thinks this was done in 1982 in [place]. no reports in EPIC   Hyperlipidemia    on meds   Hypertension    on meds   Kidney stones    Patella fracture    Retinal detachment    OD   Retinal tear of right eye    Syncopal episodes 2008   Past Surgical History:  Procedure Laterality Date   AIR/FLUID EXCHANGE Right 06/16/2014   Procedure: AIR/FLUID EXCHANGE;  Surgeon: Sherrie George, MD;  Location: [facility] OR;  Service: Ophthalmology;  Laterality: Right;   BIOPSY  04/04/2021   Procedure: BIOPSY;  Surgeon: Meridee Score Netty Starring., MD;  Location: [facility] ENDOSCOPY;  Service: Gastroenterology;;   CATARACT EXTRACTION     CATARACT EXTRACTION  09/2014   rt eye//left 01/2015   CATARACT EXTRACTION, BILATERAL  2016   per Dr. Elmer Picker    COLONOSCOPY  02/16/2020   per Dr. Rhea Belton, adenomatous polyps, repeat in 3 yrs   ENTEROSCOPY N/A 04/04/2021   Procedure: ENTEROSCOPY;  Surgeon: Mansouraty, Netty Starring., MD;  Location: [facility] ENDOSCOPY;  Service: Gastroenterology;  Laterality: N/A;   EYE SURGERY     GAS INSERTION Right 06/16/2014   Procedure: INSERTION OF GAS;  Surgeon: Sherrie George, MD;  Location: [facility] OR;  Service: Ophthalmology;   Laterality: Right;  [id number]   GIVENS CAPSULE STUDY N/A 04/02/2021   Procedure: GIVENS CAPSULE STUDY;  Surgeon: Lemar Lofty., MD;  Location[facility] ENDOSCOPY;  Service: Gastroenterology;  Laterality: N/A;   HYDROCELE EXCISION     IR ANGIOGRAM VISCERAL SELECTIVE  04/04/2021   IR US GUIDE VASC ACCESS LEFT  04/04/2021   KNEE ARTHROSCOPY Right    x 2   LASER PHOTO ABLATION Right 06/16/2014   Procedure: LASER PHOTO ABLATION;  Surgeon: Sherrie George, MD;  Location: [facility] OR;  Service: Ophthalmology;  Laterality: Right;  Headscope laser and endolaser    ORCHIECTOMY     right testicle   REPAIR OF COMPLEX TRACTION RETINAL DETACHMENT Right 06/16/2014   Procedure: REPAIR OF COMPLEX TRACTION RETINAL DETACHMENT;  Surgeon: Sherrie George, MD;  Location: [facility] OR;  Service: Ophthalmology;  Laterality: Right;   RETINAL DETACHMENT SURGERY  06/2014   SUBMUCOSAL TATTOO INJECTION  04/04/2021   Procedure: SUBMUCOSAL TATTOO INJECTION;  Surgeon: Lemar Lofty., MD;  Location: [facility] ENDOSCOPY;  Service: Gastroenterology;;   TONSILLECTOMY     VITRECTOMY Right 06/16/2014   WISDOM TOOTH EXTRACTION     Patient Active Problem List   Diagnosis Date Noted  GIST (gastrointestinal stromal tumor) of small bowel, malignant (HCC) 07/26/2021   Acute GI bleeding 04/01/2021   Acute blood loss anemia 04/01/2021   Right hip impingement syndrome 08/18/2019   Aortic atherosclerosis (HCC) 07/10/2019   Elevated hemoglobin (HCC) 09/13/2017   Rhegmatogenous retinal detachment of right eye 06/16/2014   Hyperlipidemia 10/01/2007   Essential hypertension 10/01/2007   NEOPLASM, MALIGNANT, TESTES, HX OF 10/01/2007   COLONIC POLYPS, HX OF 10/01/2007   NEPHROLITHIASIS, HX OF 10/01/2007    PCP: [name], MD   REFERRING PROVIDER: Richardean Sale, DO   REFERRING DIAG:  [phone] (ICD-10-CM) - Right hip pain  M76.01 (ICD-10-CM) - Gluteal tendinitis, right hip   THERAPY DIAG:  Pain in right hip  Difficulty in  walking, not elsewhere classified  Other muscle spasm  Muscle weakness (generalized)  RATIONALE FOR EVALUATION AND TREATMENT: Rehabilitation  ONSET DATE: chronic x 10 yrs, worsening over past few years  NEXT MD VISIT: 09/12/22   SUBJECTIVE:                                                                                                                                                                                                         SUBJECTIVE STATEMENT: Pt reports his problem has been developing for 10 yrs but worsening in the past few years. He reports stabbing pain while walking which will usually go away if he stops to rest. Pain is unpredictable in onset. Limits him from walking or doing activities such as cutting the grass. Better tolerance with activities such as pickle ball due to ability to stop and sit down. Tennis used to be no problem but now starting to have pain with this as well. All imaging has been negative for issues in his hip.  PAIN: Are you having pain? Yes: NPRS scale: 0/10 currently, but up to 10/10 Pain location: R anterior < posterior lateral hip Pain description: stabbing Aggravating factors: walking in a straight line Relieving factors: sitting down to rest or pausing while walking  PERTINENT HISTORY:  R hip impingement syndrome, HTN, GERD, malignant tumor of small bowel  PRECAUTIONS: None  WEIGHT BEARING RESTRICTIONS: No  FALLS:  Has patient fallen in last 6 months? No  LIVING ENVIRONMENT: Lives with: lives with their spouse Lives in: House/apartment Stairs: Yes: Internal: 14 steps; on left going up - bed/bath on main level Has following equipment at home: None  OCCUPATION: Retired  PLOF: Independent and Leisure: Tennis & pickle ball, walking daily (not with current pain)  PATIENT GOALS: "Eliminate the pain."   OBJECTIVE: (objective measures completed  at initial evaluation unless otherwise dated)  DIAGNOSTIC FINDINGS:  07/17/22 - Hip &  pelvis x-ray: IMPRESSION: Mild left and minimal right femoroacetabular osteoarthritis.  PATIENT SURVEYS:  LEFS 55 / 80 = 68.8 %  COGNITION: Overall cognitive status: Within functional limits for tasks assessed    SENSATION: WFL  MUSCLE LENGTH: Hamstrings: mod tight R>L ITB: mild tight R>L Piriformis: mod tight B Hip flexors: mod tight B Quads: mod tight B Heelcord: NT  POSTURE:  No Significant postural limitations  PALPATION: Increased muscle tension in R>L glutes and piriformis, hip flexors.  No TTP over R greater trochanter.  Reserve: Full  LOWER EXTREMITY ROM: Lumbar ROM WNL Mildly limited B hip ROM - limitations most pronounced in hip extension & IR/ER  LOWER EXTREMITY MMT:  MMT Right eval Left eval  Hip flexion 4+ 5  Hip extension 4+ 4+  Hip abduction 4+ 5  Hip adduction 5 5  Hip internal rotation 4+ limited motion 5 limited motion  Hip external rotation 4 4+  Knee flexion 5 5  Knee extension 5 5  Ankle dorsiflexion 4 4+  Ankle plantarflexion 5- 5  Ankle inversion    Ankle eversion     (Blank rows = not tested)    TODAY'S TREATMENT:   08/29/22 THERAPEUTIC EXERCISE: to improve flexibility, strength and mobility.  Verbal and tactile cues throughout for technique. Supine R SKTC x 30" Hooklying R figure-4 piriformis stretch x 30" Hooklying R KTOS piriformis stretch x 30" Hooklying R heel to hip piriformis stretch x 30" - deferred from HEP due to increased knee discomfort Supine R HS stretch with strap x 30" Supine R cross-body ITB stretch with strap x 30" Mod Thomas R hip flexor/quad stretch with strap x 30"   PATIENT EDUCATION:  Education details: PT eval findings, anticipated POC, and initial HEP Person educated: Patient Education method: Programmer, multimedia, Demonstration, Verbal cues, and Handouts Education comprehension: verbalized understanding, returned demonstration, verbal cues required, and needs further education  HOME EXERCISE PROGRAM: Access  Code: [email or web address] URL: [email or web address] Date: 08/29/2022 Prepared by: Glenetta Hew  Exercises - Supine Single Knee to Chest Stretch  - 2-3 x daily - 7 x weekly - 3 reps - 30 sec hold - Supine Figure 4 Piriformis Stretch  - 2-3 x daily - 7 x weekly - 3 reps - 30 sec hold - Supine Piriformis Stretch with Foot on Ground  - 2-3 x daily - 7 x weekly - 3 reps - 30 sec hold - Supine Hamstring Stretch with Strap  - 2-3 x daily - 7 x weekly - 3 reps - 30 sec hold - Supine Iliotibial Band Stretch with Strap  - 2-3 x daily - 7 x weekly - 3 reps - 30 sec hold - Supine Quadriceps Stretch with Strap on Table (Mirrored)  - 2-3 x daily - 7 x weekly - 3 reps - 30 sec hold   ASSESSMENT:  CLINICAL IMPRESSION: [name] [name] is a 67 y.o. male who was seen today for physical therapy evaluation and treatment for R hip/buttock pain presumed to be due to gluteal tendinitis.  He reports he has had pain for up to 10 years, worsening over the past few years.  Pain is intermittent, most commonly triggered by straight line walking, however he has started to notice it more while playing tennis recently.  Current deficits include intermittent R lateral hip pain with occasional anterior and posterior component, decreased proximal LE flexibility, increased muscle tension and taut bands in R glutes and  piriformis, mild proximal LE weakness (R>L) and limited activity tolerance.  Patient reports pain from walking for exercise, limits his ability to mow the yard, and has started to interfere with his ability to play tennis and pickle ball.  [place] "[name]" will benefit from skilled PT to address above deficits to improve mobility and activity tolerance with decreased pain interference.   OBJECTIVE IMPAIRMENTS: decreased activity tolerance, decreased endurance, decreased mobility, difficulty walking, decreased ROM, decreased strength, hypomobility, increased fascial restrictions, impaired perceived functional ability,  increased muscle spasms, impaired flexibility, and pain.   ACTIVITY LIMITATIONS: squatting, bed mobility, dressing, locomotion level, and caring for others  PARTICIPATION LIMITATIONS: interpersonal relationship, community activity, yard work, and recreational activities  PERSONAL FACTORS: Past/current experiences, Time since onset of injury/illness/exacerbation, and 3+ comorbidities: R hip impingement syndrome, HTN, GERD, cancer - malignant tumor of small bowel  are also affecting patient's functional outcome.   REHAB POTENTIAL: Good  CLINICAL DECISION MAKING: Stable/uncomplicated  EVALUATION COMPLEXITY: Low   GOALS: Goals reviewed with patient? Yes  SHORT TERM GOALS: Target date: 09/19/2022  Patient will be independent with initial HEP. Baseline:  Goal status: INITIAL  LONG TERM GOALS: Target date: 10/10/2022  Patient will be independent with advanced/ongoing HEP to improve outcomes and carryover.  Baseline:  Goal status: INITIAL  2.  Patient will report at least 50-75% improvement in R hip pain to improve QOL. Baseline: Pain up to 10/10 Goal status: INITIAL  3.  Patient will demonstrate improved B LE strength to 5/5 for improved stability and ease of mobility. Baseline: Refer to above MMT table Goal status: INITIAL  4.  Patient will be able to ambulate 600' with normal gait pattern without increased pain to access community.  Baseline: Limited walking tolerance due to pain Goal status: INITIAL  5.  Patient will report >/= 78% on LEFS to demonstrate improved functional ability. Baseline: 55 / 80 = 68.8 % Goal status: INITIAL  6.  Patient will report ability to play tennis or pickle ball without limitation due to R hip pain. Baseline:  Goal status: INITIAL  6.  Patient will report ability to mow his lawn without limitation due to R hip pain. Baseline:  Goal status: INITIAL    PLAN:  PT FREQUENCY: 2x/week  PT DURATION: 4-6 weeks  PLANNED INTERVENTIONS:  Therapeutic exercises, Therapeutic activity, Neuromuscular re-education, Balance training, Gait training, Patient/Family education, Self Care, Joint mobilization, Dry Needling, Electrical stimulation, Spinal manipulation, Spinal mobilization, Cryotherapy, Moist heat, Taping, Ultrasound, Ionotophoresis 4mg /ml Dexamethasone, Manual therapy, and Re-evaluation  PLAN FOR NEXT SESSION: Review initial HEP; progress hip flexibility & strengthening; MT +/- DN to R glutes, piriformis and hip flexors   Marry Guan, PT 08/29/2022, 4:17 PM

## 2022-08-28 ENCOUNTER — Other Ambulatory Visit: Payer: Self-pay | Admitting: Family Medicine

## 2022-08-28 NOTE — Telephone Encounter (Signed)
Prescription Request  08/28/2022  LOV: 07/11/2022  What is the name of the medication or equipment? testosterone cypionate (DEPOTESTOSTERONE CYPIONATE) 200 MG/ML injection   Have you contacted your pharmacy to request a refill? Yes   Which pharmacy would you like this sent to?  [id number] - [place], [place] - [address], [address] AT [facility] [address], [address] [place] [address] Phone: [phone] Fax: [phone]   Patient notified that their request is being sent to the clinical staff for review and that they should receive a response within 2 business days.   Please advise at Mobile [phone] (mobile)

## 2022-08-29 ENCOUNTER — Encounter: Payer: Self-pay | Admitting: Physical Therapy

## 2022-08-29 ENCOUNTER — Other Ambulatory Visit: Payer: Self-pay | Admitting: Family Medicine

## 2022-08-29 ENCOUNTER — Other Ambulatory Visit: Payer: Self-pay

## 2022-08-29 ENCOUNTER — Ambulatory Visit: Payer: Medicare Other | Attending: Sports Medicine | Admitting: Physical Therapy

## 2022-08-29 DIAGNOSIS — M25551 Pain in right hip: Secondary | ICD-10-CM | POA: Insufficient documentation

## 2022-08-29 DIAGNOSIS — R262 Difficulty in walking, not elsewhere classified: Secondary | ICD-10-CM | POA: Insufficient documentation

## 2022-08-29 DIAGNOSIS — I10 Essential (primary) hypertension: Secondary | ICD-10-CM

## 2022-08-29 DIAGNOSIS — M6281 Muscle weakness (generalized): Secondary | ICD-10-CM | POA: Diagnosis present

## 2022-08-29 DIAGNOSIS — M62838 Other muscle spasm: Secondary | ICD-10-CM | POA: Diagnosis present

## 2022-08-29 DIAGNOSIS — M7601 Gluteal tendinitis, right hip: Secondary | ICD-10-CM | POA: Insufficient documentation

## 2022-08-29 IMAGING — CT CTA GI BLEED
3 of 15 series · 9 of 46 positions shown, 15 images · IV contrast (omnipaque)
Comparison: CT abdomen pelvis-04/01/2021; tagged nuclear medicine
RBC bleeding study-04/02/2021

CLINICAL DATA: Recent endoscopy with concern for small bowel
bleeding.

EXAM:
CTA ABDOMEN AND PELVIS WITHOUT AND WITH CONTRAST
TECHNIQUE: Multidetector CT imaging of the abdomen and pelvis was performed
using the standard protocol during bolus administration of
intravenous contrast. Multiplanar reconstructed images and MIPs were
obtained and reviewed to evaluate the vascular anatomy.
CONTRAST:  100mL OMNIPAQUE IOHEXOL 350 MG/ML SOLN

[Series 5: abd/ pelvis 5.0 i30f 2 · axial · 0.88mm/px · z∈[+920,+1004]mm · 2 of 103 slices shown]
[im 18/103  soft-tissue]
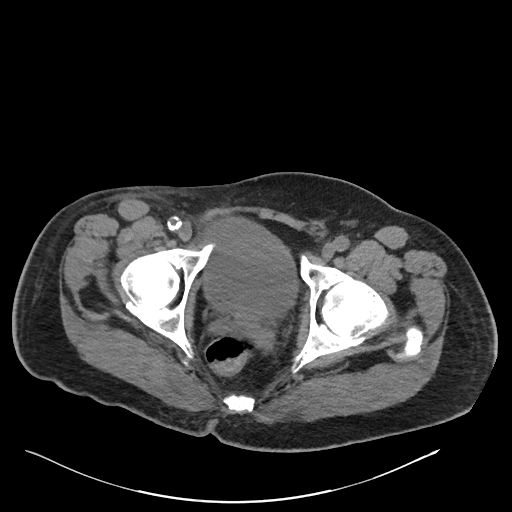
[im 35/103  soft-tissue]
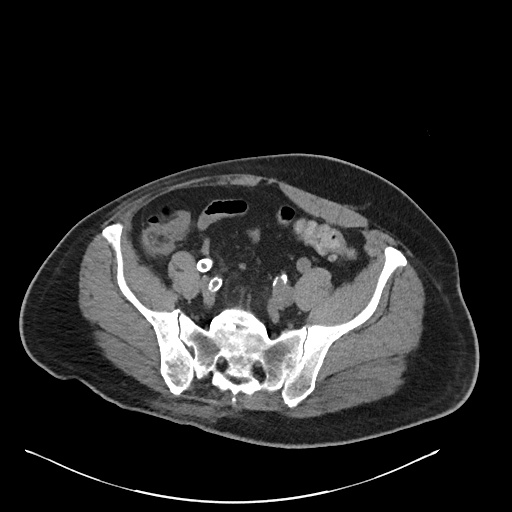

[Series 11: coronals · coronal · 0.89mm/px · 1 of 153 slices shown, 2 images]
[im 77/153  soft-tissue]
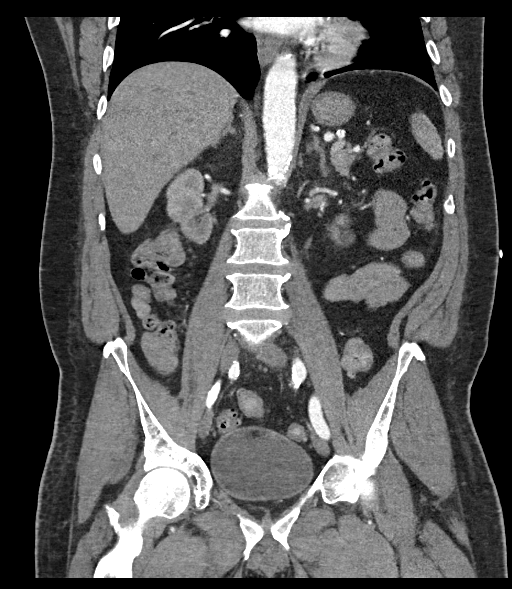
[im 77/153  bone]
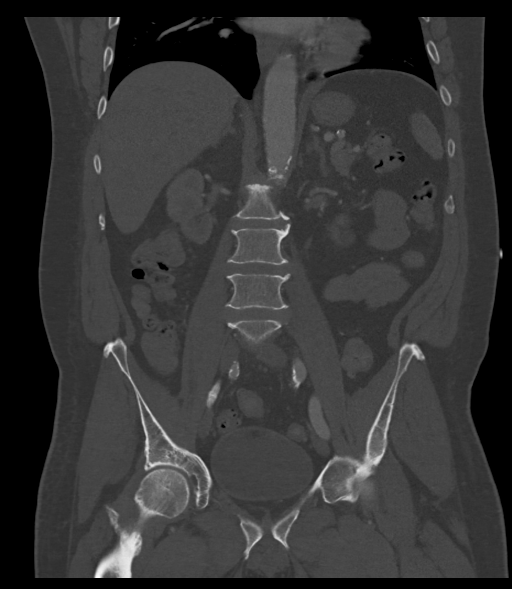

[Series 16: venous 5.0 i30f 1 · axial · portal-venous · 0.89mm/px · z∈[+908,+1258]mm · 6 of 100 slices shown, 11 images]
[im 15/100  soft-tissue]
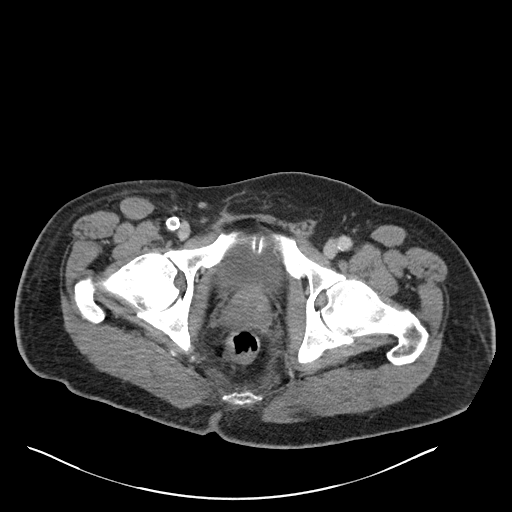
[im 15/100  bone]
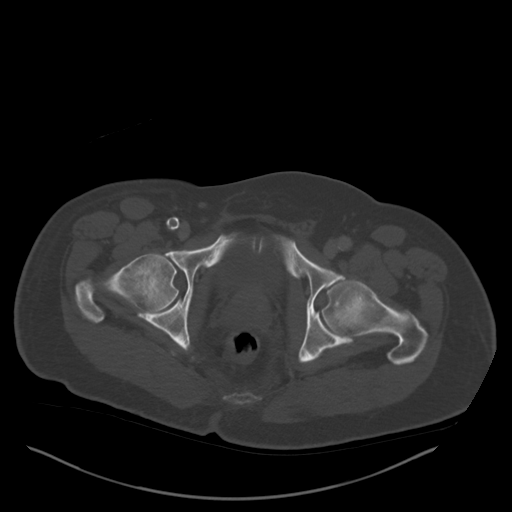
[im 29/100  soft-tissue]
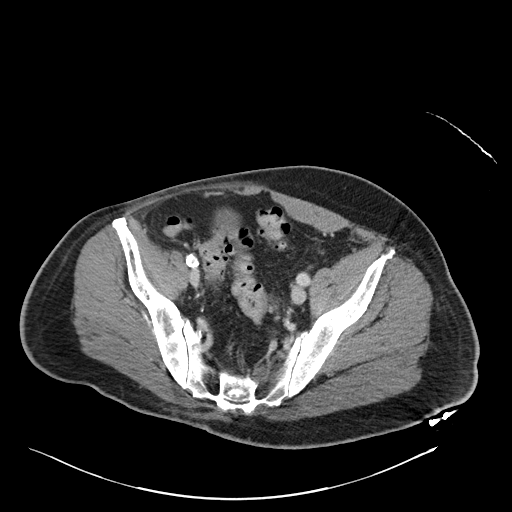
[im 43/100  soft-tissue]
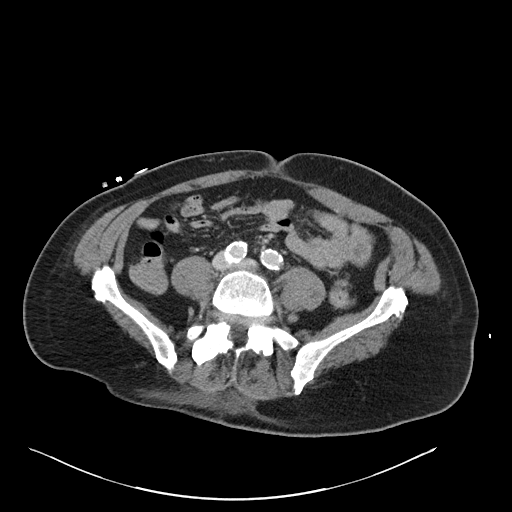
[im 43/100  lung]
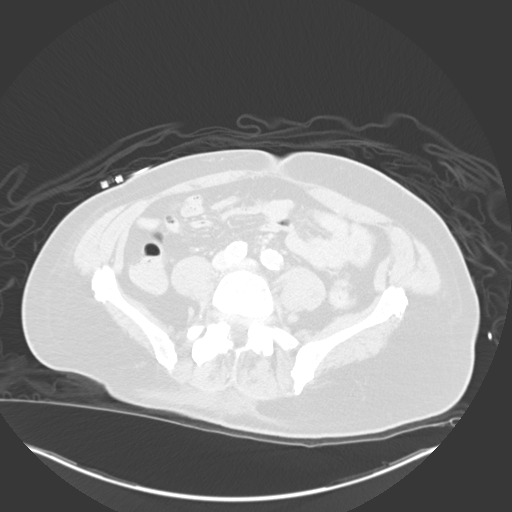
[im 57/100  soft-tissue]
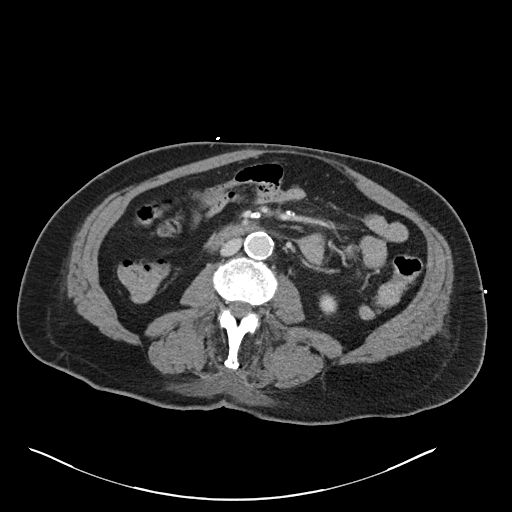
[im 57/100  lung]
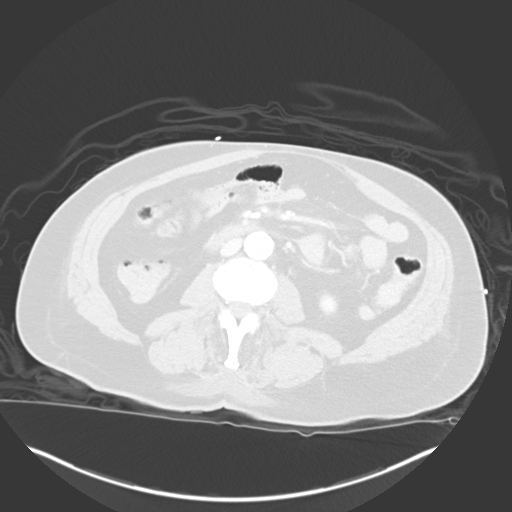
[im 71/100  soft-tissue]
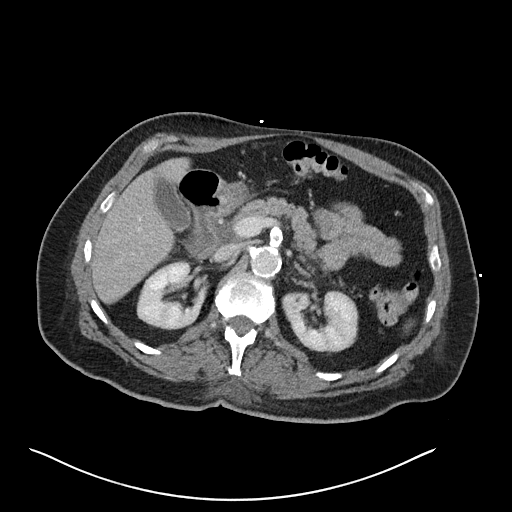
[im 71/100  lung]
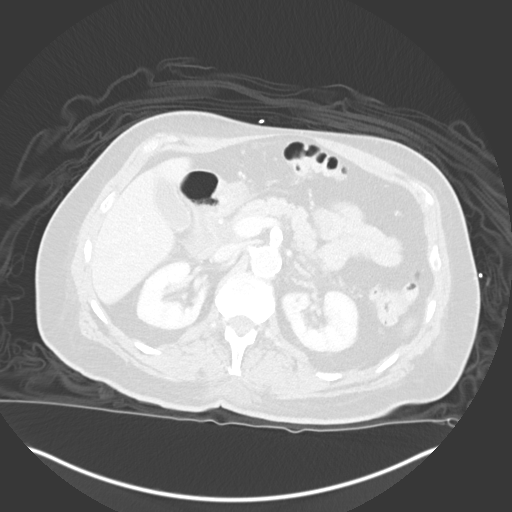
[im 85/100  soft-tissue]
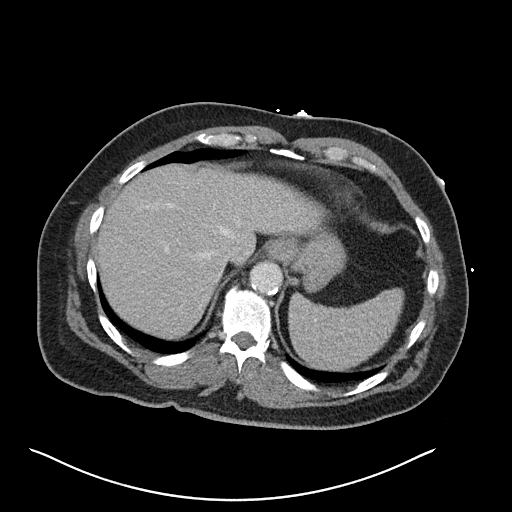
[im 85/100  lung]
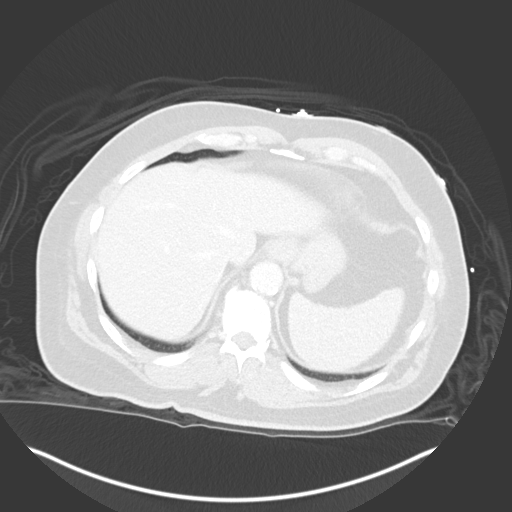

[9 of 46 positions shown; findings below may reference images not displayed]

FINDINGS: VASCULAR

Aorta: There is a large amount of calcified and noncalcified
atherosclerotic plaque within a normal caliber abdominal aorta, not
resulting in a hemodynamically significant stenosis. No evidence of
abdominal aortic dissection or periaortic stranding.

Celiac: There is a moderate to large amount of calcified
atherosclerotic plaque involving the origin of the celiac artery,
not resulting in a hemodynamically significant stenosis.
Conventional branching pattern.

SMA: There is a large amount of calcified and noncalcified
atherosclerotic plaque involving the origin and main trunk of the
SMA, not definitely resulting in a hemodynamically significant
stenosis. There is a large amount of calcified atherosclerotic
plaque involving the origin and proximal aspects of the main
branches of the SMA likely resulting in tandem areas of
hemodynamically significant narrowing though suboptimally evaluated
due to mural calcifications.

Ill-defined area of hyperemia and potential intraluminal contrast
extravasation involving the mid jejunum is likely supplied via one
of the tertiary branches of the SMA.

Renals: Right renal artery is duplicated, nearly co-dominant. There
is unchanged atrophy involving the inferior pole of the right
kidney, likely vascular in etiology. There is a large amount of
calcified atherosclerotic plaque involving the origin of all renal
arteries, resulting at least 50% luminal narrowing bilaterally.
There is unchanged atrophy involving the inferior pole of the right
kidney, likely vascular in etiology.

IMA: There is a suspected hemodynamically significant
narrowing/subtotal occlusion involving the origin of the IMA with
reconstitution distally, presumably from collateral supply from the
SMA.

Inflow: There is a moderate amount of calcified atherosclerotic
plaque involving the bilateral common iliac arteries, not resulting
in hemodynamically significant stenosis.

There is a large amount of predominantly calcified atherosclerotic
plaque involving the right external iliac artery approaching tandem
areas of at least 50% luminal narrowing. The left external iliac
artery is of normal caliber and widely patent without
hemodynamically significant narrowing.

Proximal Outflow: There is a moderate amount of eccentric
predominantly calcified atherosclerotic plaque involving the right
common femoral artery, not definitely resulting in hemodynamically
significant stenosis.

The left common femoral artery is of normal caliber and widely
patent without hemodynamically significant narrowing.

The imaged portions of the bilateral superficial and deep femoral
arteries are of normal caliber and widely patent without
hemodynamically significant narrowing.

Veins: The IVC and pelvic venous systems appear widely patent.

Review of the MIP images confirms the above findings.

_________________________________________________________

NON-VASCULAR

Lower chest: Limited visualization of the lower thorax is negative
for focal airspace opacity or pleural effusion.

Normal heart size.  No pericardial effusion.

Hepatobiliary: Normal hepatic contour. No discrete hepatic lesions.
Normal appearance of the gallbladder given degree distention. No
radiopaque gallstones. No intra or extrahepatic biliary duct
dilatation. No ascites.

Pancreas: Normal appearance of the pancreas.

Spleen: Normal appearance of the spleen.

Adrenals/Urinary Tract: .

Stomach/Bowel: There is an ill-defined area hyperemia and potential
intraluminal contrast extravasation involving the mid jejunum within
the left mid hemiabdomen (axial image 91, series 15; coronal image
67, series 13). No associated bowel wall thickening.

No additional discrete areas of intraluminal contrast extravasation
are identified.

Moderate colonic stool burden without evidence of enteric
obstruction. Endoscopy capsule is seen at the level of the distal
sigmoid colon. Extensive colonic diverticulosis without evidence
superimposed acute diverticulitis. Normal appearance of the terminal
ileum and the appendix. Suspected approximally 0.8 cm intraluminal
lipoma at the level of the cecum (image 99, series 15), similar to
the 04/01/2021 examination. No pneumoperitoneum, pneumatosis or
portal venous gas.

Lymphatic: No bulky retroperitoneal, mesenteric, pelvic or inguinal
lymphadenopathy.

Reproductive: Normal appearance the prostate gland. No free fluid
the pelvic cul-de-sac.

Other: Asymmetric atrophy involving the right rectus abdominal
musculature. Subcutaneous edema about the midline of the low back.

Musculoskeletal: No acute or aggressive osseous abnormalities. There
is partial lumbarization of the S1 vertebral body. Stigmata of dish
within the thoracic spine. Moderate bilateral facet degenerative
change the lower lumbar spine. Mild-to-moderate degenerative change
the bilateral hips with joint space loss, subchondral sclerosis and
osteophytosis.
IMPRESSION: VASCULAR

1. The examination is positive for hyperemia and potential
intraluminal contrast extravasation involving the mid jejunum within
the left mid hemiabdomen, likely supplied via tertiary branches from
the SMA. The SMA is heavily diseased with suspected areas of
hemodynamically significant narrowings involving its major branches
and divisions.
2. Large amount of atherosclerotic plaque within normal caliber
abdominal aorta, not resulting in a hemodynamically significant
stenosis.
3. Suspected hemodynamically significant narrowings involving the
bilateral renal arteries including geographic atrophy involving the
inferior pole the right kidney, likely vascular in etiology.
4.  Aortic Atherosclerosis ([id number]).

NON-VASCULAR

1. Colonic diverticulosis without evidence superimposed acute
diverticulitis.

## 2022-08-29 IMAGING — XA IR ANGIO/VISCERAL SELECTIVE EA VESSEL WO/W FLUSH
5 series · 13 of 21 positions shown · non-contrast
Comparison: CTA abdomen pelvis-earlier same day;

INDICATION: Concern for small bowel bleeding. Please perform mesenteric
arteriogram potential embolization as indicated.

EXAM:
1. ULTRASOUND GUIDANCE FOR ARTERIAL ACCESS
2. SELECTIVE SUPERIOR MESENTERIC ARTERIOGRAM

[Series 1: fl (-) angio · 1 of 1 slices shown]
[im 1/1]
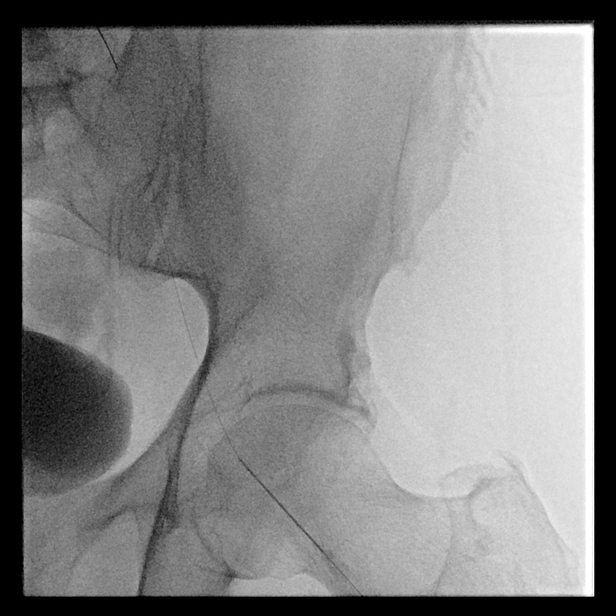

[Series 2: fl angio · 2 of 33 frames shown]
[frame 17/33]
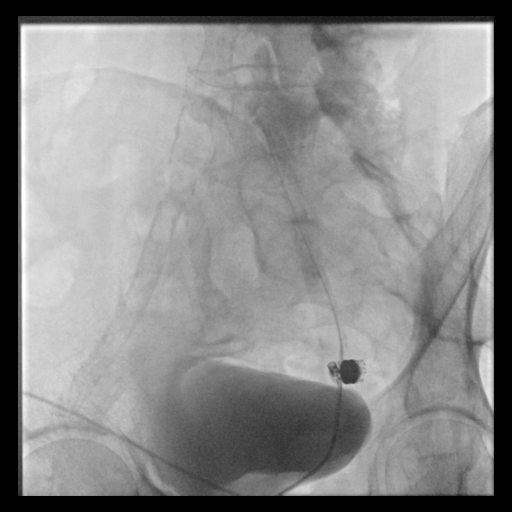
[frame 29/33]
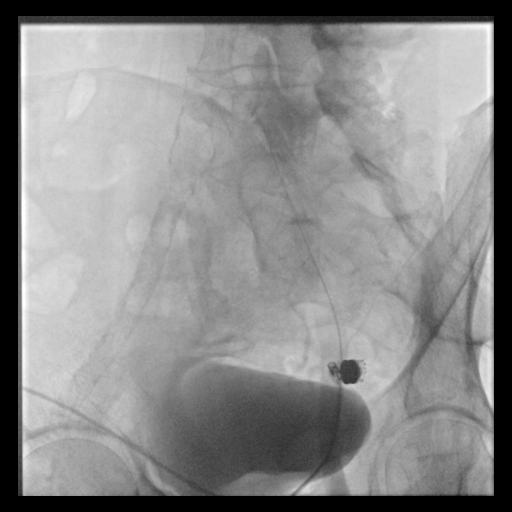

[Series 3: body 4 care · 2 acquisitions, 3 frames shown (1 of 3)]
[im 1/2]
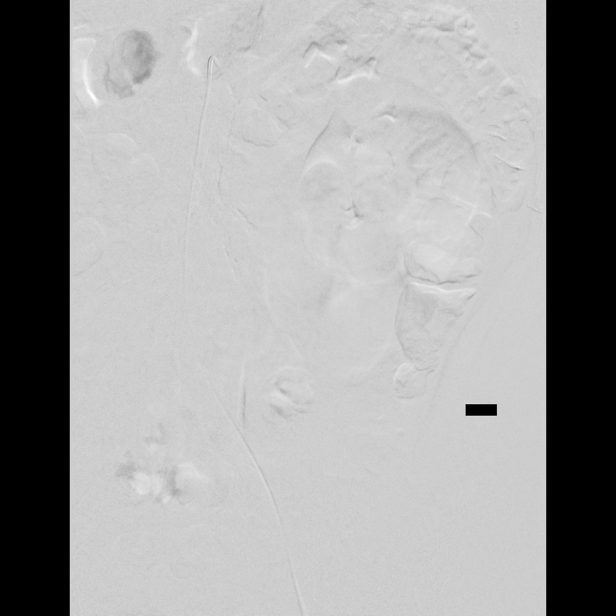
[im 1/2]
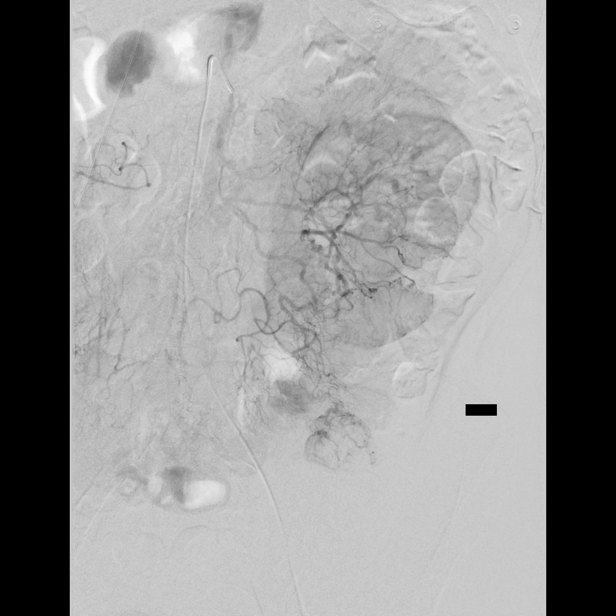
[im 1/2]
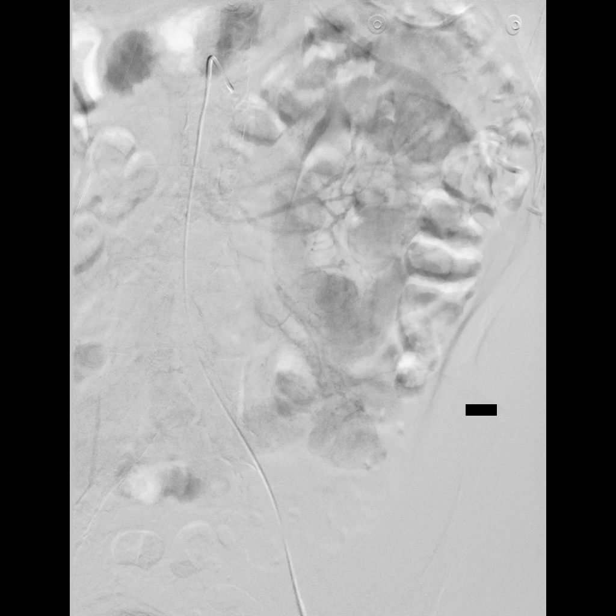

[Series 4: body 4 care · 3 acquisitions, 4 frames shown (2 of 3)]
[im 1/3]
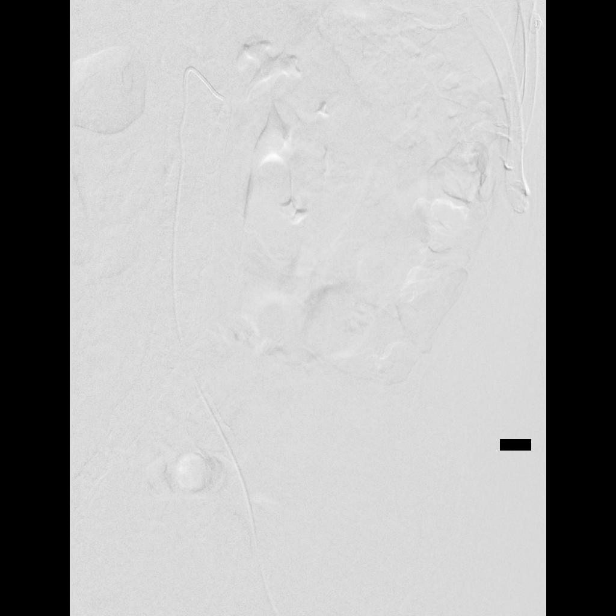
[im 1/3]
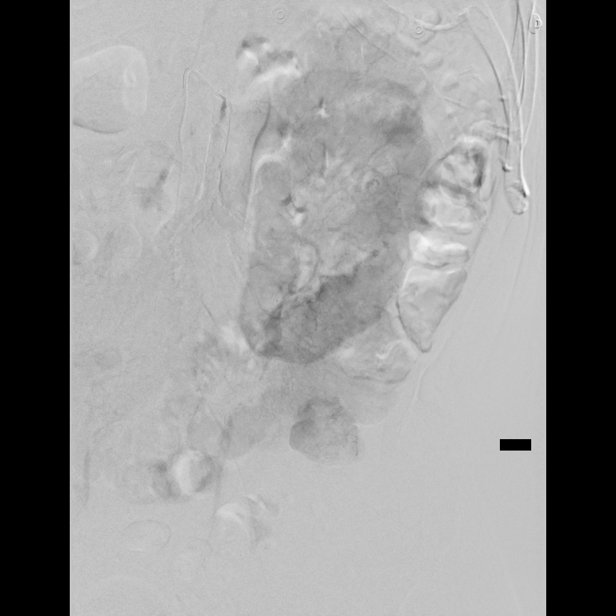
[im 1/3]
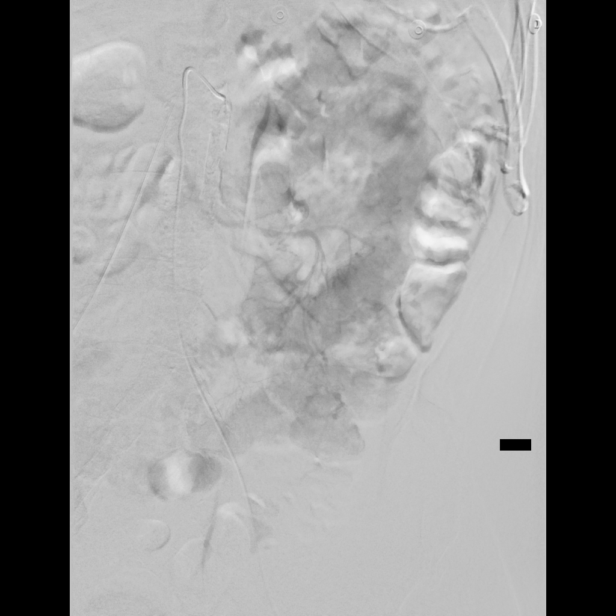
[im 3/3]
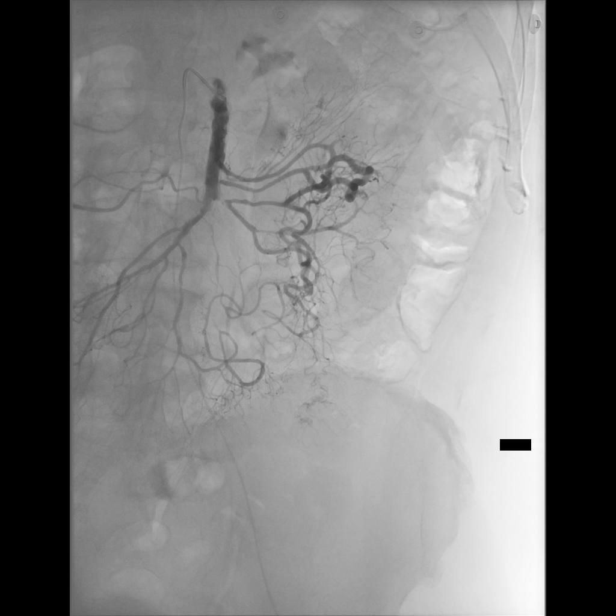

[Series 5: body 4 care · 2 acquisitions, 3 frames shown (3 of 3)]
[im 1/2]
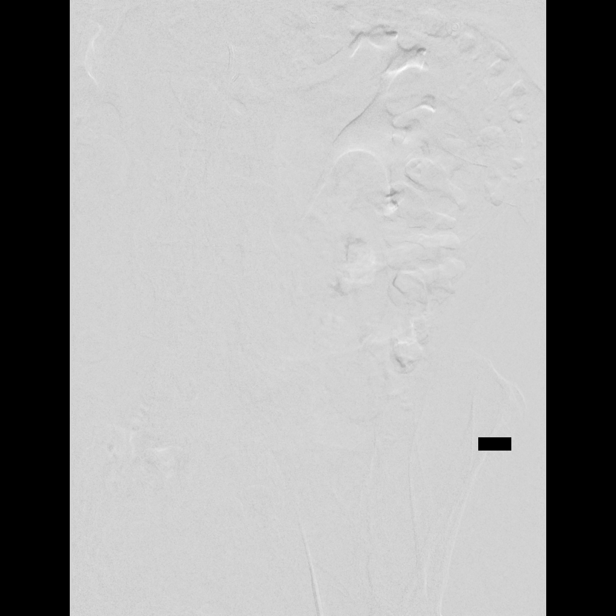
[im 1/2]
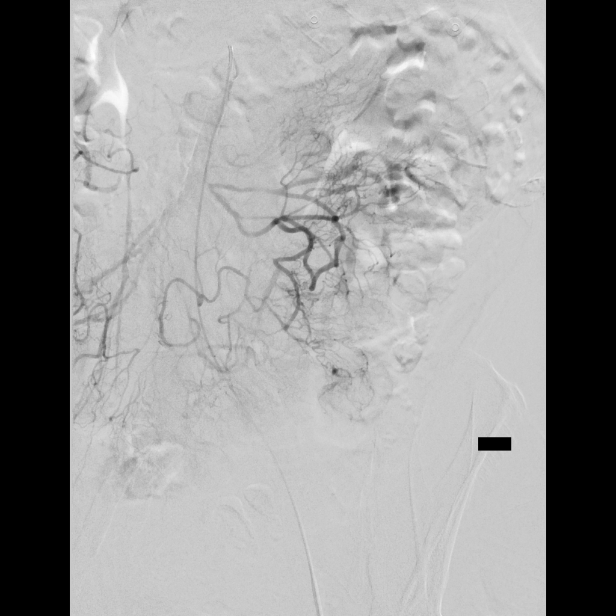
[im 2/2]
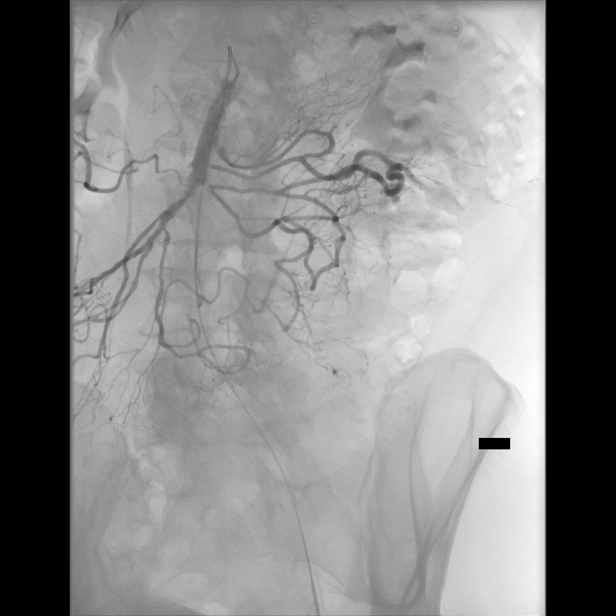

[13 of 21 positions shown; findings below may reference images not displayed]

04/01/2021;
nuclear medicine tagged RBC study-04/02/2021

MEDICATIONS:
None

ANESTHESIA/SEDATION:
Moderate (conscious) sedation was employed during this procedure. A
total of Versed 1.5 mg and Fentanyl 50 mcg was administered
intravenously.

Moderate Sedation Time: 28 minutes. The patient's level of
consciousness and vital signs were monitored continuously by
radiology nursing throughout the procedure under my direct
supervision.

CONTRAST:  100 cc Omnipaque 300

FLUOROSCOPY TIME:  1 minute, 42 seconds (141 mGy)

COMPLICATIONS:
None immediate.

PROCEDURE:
Informed consent was obtained from the patient following explanation
of the procedure, risks, benefits and alternatives. All questions
were addressed. A time out was performed prior to the initiation of
the procedure. Maximal barrier sterile technique utilized including
caps, mask, sterile gowns, sterile gloves, large sterile drape, hand
hygiene, and chlorhexidine prep.

Given the presence of significant Marmar occlusive disease affecting
the right external iliac artery, the decision was made to perform a
left common femoral arterial approach. As such, the left femoral
head was marked fluoroscopically. Under sterile conditions and local
anesthesia, the right common femoral artery access was performed
with a micropuncture needle. Under direct ultrasound guidance, the
left common femoral was accessed with a micropuncture kit. An
ultrasound image was saved for documentation purposes. This allowed
for placement of a 5-French vascular sheath. A limited arteriogram
was performed through the side arm of the sheath confirming
appropriate access within the left common femoral artery.

Over a Bentson wire, a Mickelson catheter was advanced the caudal
aspect of the thoracic aorta where was reformed, back bled and
flushed.

The Mickelson catheter was then utilized to select the superior
mesenteric artery and a selective superior mesenteric arteriogram
was performed.

Multiple selective superior mesenteric arteriograms were performed
in various obliquities.

Images were reviewed and the procedure was terminated. All wires,
catheters and sheaths were removed from the patient. Hemostasis was
achieved at the right groin access site with manual compression.

The patient tolerated the procedure well without immediate post
procedural complication.
FINDINGS: Selective superior mesenteric arteriogram demonstrates 3 separate
ileocolic branches arising from the left side of the main trunk of
the SMA.

There is short-segment occlusion involving the caudal most division
of the ileocolic branch with reconstitution via hypertrophied arcade
collaterals.

Confirmed on multiple oblique angiograms is an ill-defined area of
hyperemia, potentially neovascularity, within the left lower
abdomen, felt to correlate with the ill-defined area of hyperemia on
preceding CTA.

While predominantly supplied by the caudal most reconstituted
ileocolic division, there is a lack of a definitive or dominant
vessel supplying this ill-defined area of hyperemia, rather it
appears to be supplied by markedly tortuous arterial collaterals.

Multiple arteriograms were performed and while the area of
ill-defined hyperemia persisted, there is no definitive intraluminal
contrast extravasation to suggest ongoing GI bleeding.
IMPRESSION: Persistent ill-defined area of hyperemia/potential neovascularity
within the left lower abdomen, likely correlating with the
ill-defined area of hyperemia seen on preceding CTA, without
discrete area of intraluminal contrast extravasation.

Findings are nonspecific though worrisome for the presence of an
underlying small bowel tumor.

The patient is a poor candidate for percutaneous embolization
secondary to short-segment occlusion involving the caudal most
ileocolic division as well as lack of a dominant arterial supply to
the ill-defined area of hyperemia/potentially neovascularity,
placing the patient at high risk for post procedure small bowel
ischemia.

PLAN:
- The patient is to remain flat for 4 hours with right leg straight.

- As findings may represent the presence of an underlying small
bowel tumor, surgical consultation could be performed as indicated.

Above was discussed with both Gawab. Loeffler (TRH) and Usmonova (GI)
at the time of procedure completion.

## 2022-08-29 MED ORDER — TESTOSTERONE CYPIONATE 200 MG/ML IM SOLN: 200.0000 mg | INTRAMUSCULAR | 1 refills | Status: DC

## 2022-08-30 ENCOUNTER — Telehealth: Payer: Self-pay

## 2022-08-30 ENCOUNTER — Other Ambulatory Visit (HOSPITAL_COMMUNITY): Payer: Self-pay

## 2022-08-30 NOTE — Telephone Encounter (Signed)
Per pharmacy Testosterone requires PA.  PA started via [facility] Key: [id number]

## 2022-08-30 NOTE — Telephone Encounter (Signed)
Patient Advocate Encounter  Prior Authorization for Testosterone cypionate 200mg /ml has been approved with [employer].     Effective dates: 04/10/22 through 04/10/23  Per [facility] test claim, unable to quote a price because [facility] is not a contracted pharmacy.   Approval letter attached to chart

## 2022-08-31 NOTE — Telephone Encounter (Signed)
Pt notified via My Chart

## 2022-09-01 ENCOUNTER — Ambulatory Visit: Payer: Medicare Other | Admitting: Physical Therapy

## 2022-09-01 ENCOUNTER — Encounter: Payer: Self-pay | Admitting: Physical Therapy

## 2022-09-01 DIAGNOSIS — R262 Difficulty in walking, not elsewhere classified: Secondary | ICD-10-CM

## 2022-09-01 DIAGNOSIS — M6281 Muscle weakness (generalized): Secondary | ICD-10-CM

## 2022-09-01 DIAGNOSIS — M25551 Pain in right hip: Secondary | ICD-10-CM

## 2022-09-01 DIAGNOSIS — M62838 Other muscle spasm: Secondary | ICD-10-CM

## 2022-09-01 NOTE — Therapy (Signed)
OUTPATIENT PHYSICAL THERAPY TREATMENT   Patient Name: [name] [name] MRN: [id number] DOB:01/04/1956, 67 y.o., male Today's Date: 09/01/2022   END OF SESSION:  PT End of Session - 09/01/22 0802     Visit Number 2    Date for PT Re-Evaluation 10/10/22    Authorization Type Medicare & [employer]    Progress Note Due on Visit 10    PT Start Time 0802    PT Stop Time 0846    PT Time Calculation (min) 44 min    Activity Tolerance Patient tolerated treatment well    Behavior During Therapy [facility] for tasks assessed/performed             Past Medical History:  Diagnosis Date   Ankle fracture    Cancer (HCC) 1974   testicular cancer-at age 38   Cataract    bilateral sx   GERD (gastroesophageal reflux disease)    hx of   Hx of colonic polyps    pt unsure,thinks this was done in 1982 in [place]. no reports in EPIC   Hyperlipidemia    on meds   Hypertension    on meds   Kidney stones    Patella fracture    Retinal detachment    OD   Retinal tear of right eye    Syncopal episodes 2008   Past Surgical History:  Procedure Laterality Date   AIR/FLUID EXCHANGE Right 06/16/2014   Procedure: AIR/FLUID EXCHANGE;  Surgeon: Sherrie George, MD;  Location: [facility] OR;  Service: Ophthalmology;  Laterality: Right;   BIOPSY  04/04/2021   Procedure: BIOPSY;  Surgeon: Meridee Score Netty Starring., MD;  Location: [facility] ENDOSCOPY;  Service: Gastroenterology;;   CATARACT EXTRACTION     CATARACT EXTRACTION  09/2014   rt eye//left 01/2015   CATARACT EXTRACTION, BILATERAL  2016   per Dr. Elmer Picker    COLONOSCOPY  02/16/2020   per Dr. Rhea Belton, adenomatous polyps, repeat in 3 yrs   ENTEROSCOPY N/A 04/04/2021   Procedure: ENTEROSCOPY;  Surgeon: Mansouraty, Netty Starring., MD;  Location: [facility] ENDOSCOPY;  Service: Gastroenterology;  Laterality: N/A;   EYE SURGERY     GAS INSERTION Right 06/16/2014   Procedure: INSERTION OF GAS;  Surgeon: Sherrie George, MD;  Location: [facility] OR;  Service: Ophthalmology;  Laterality: Right;   [id number]   GIVENS CAPSULE STUDY N/A 04/02/2021   Procedure: GIVENS CAPSULE STUDY;  Surgeon: Lemar Lofty., MD;  Location: [facility] ENDOSCOPY;  Service: Gastroenterology;  Laterality: N/A;   HYDROCELE EXCISION     IR ANGIOGRAM VISCERAL SELECTIVE  04/04/2021   IR US GUIDE VASC ACCESS LEFT  04/04/2021   KNEE ARTHROSCOPY Right    x 2   LASER PHOTO ABLATION Right 06/16/2014   Procedure: LASER PHOTO ABLATION;  Surgeon: Sherrie George, MD;  Location: [facility] OR;  Service: Ophthalmology;  Laterality: Right;  Headscope laser and endolaser    ORCHIECTOMY     right testicle   REPAIR OF COMPLEX TRACTION RETINAL DETACHMENT Right 06/16/2014   Procedure: REPAIR OF COMPLEX TRACTION RETINAL DETACHMENT;  Surgeon: Sherrie George, MD;  Location: [facility] OR;  Service: Ophthalmology;  Laterality: Right;   RETINAL DETACHMENT SURGERY  06/2014   SUBMUCOSAL TATTOO INJECTION  04/04/2021   Procedure: SUBMUCOSAL TATTOO INJECTION;  Surgeon: Lemar Lofty., MD;  Location: [facility] ENDOSCOPY;  Service: Gastroenterology;;   TONSILLECTOMY     VITRECTOMY Right 06/16/2014   WISDOM TOOTH EXTRACTION     Patient Active Problem List   Diagnosis Date Noted  GIST (gastrointestinal stromal tumor) of small bowel, malignant (HCC) 07/26/2021   Acute GI bleeding 04/01/2021   Acute blood loss anemia 04/01/2021   Right hip impingement syndrome 08/18/2019   Aortic atherosclerosis (HCC) 07/10/2019   Elevated hemoglobin (HCC) 09/13/2017   Rhegmatogenous retinal detachment of right eye 06/16/2014   Hyperlipidemia 10/01/2007   Essential hypertension 10/01/2007   NEOPLASM, MALIGNANT, TESTES, HX OF 10/01/2007   COLONIC POLYPS, HX OF 10/01/2007   NEPHROLITHIASIS, HX OF 10/01/2007    PCP: [name], MD   REFERRING PROVIDER: Richardean Sale, DO   REFERRING DIAG:  [phone] (ICD-10-CM) - Right hip pain  M76.01 (ICD-10-CM) - Gluteal tendinitis, right hip   THERAPY DIAG:  Pain in right hip  Difficulty in walking, not elsewhere  classified  Other muscle spasm  Muscle weakness (generalized)  RATIONALE FOR EVALUATION AND TREATMENT: Rehabilitation  ONSET DATE: chronic x 10 yrs, worsening over past few years  NEXT MD VISIT: 09/12/22   SUBJECTIVE:                                                                                                                                                                                                         SUBJECTIVE STATEMENT: Pt reports his problem has been developing for 10 yrs but worsening in the past few years. He reports stabbing pain while walking which will usually go away if he stops to rest. Pain is unpredictable in onset. Limits him from walking or doing activities such as cutting the grass. Better tolerance with activities such as pickle ball due to ability to stop and sit down. Tennis used to be no problem but now starting to have pain with this as well. All imaging has been negative for issues in his hip.  PAIN: Are you having pain? Yes: NPRS scale: 0/10 currently, but up to 10/10 Pain location: R anterior < posterior lateral hip Pain description: stabbing Aggravating factors: walking in a straight line Relieving factors: sitting down to rest or pausing while walking  PERTINENT HISTORY:  R hip impingement syndrome, HTN, GERD, malignant tumor of small bowel  PRECAUTIONS: None  WEIGHT BEARING RESTRICTIONS: No  FALLS:  Has patient fallen in last 6 months? 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  PATIENT SURVEYS:  LEFS 55 / 80 = 68.8 %  COGNITION: Overall cognitive status: Within functional limits for tasks assessed    SENSATION: WFL  MUSCLE LENGTH: Hamstrings: mod tight R>L ITB: mild tight R>L Piriformis: mod tight B Hip flexors: mod tight B Quads: mod tight B Heelcord: NT  POSTURE:  No Significant postural limitations  PALPATION: Increased muscle tension in R>L glutes and piriformis, hip flexors.  No TTP over R greater trochanter.    LOWER EXTREMITY ROM: Lumbar ROM WNL Mildly limited B hip ROM - limitations most pronounced in hip extension & IR/ER  LOWER EXTREMITY MMT:  MMT Right eval Left eval  Hip flexion 4+ 5  Hip extension 4+ 4+  Hip abduction 4+ 5  Hip adduction 5 5  Hip internal rotation 4+ limited motion 5 limited motion  Hip external rotation 4 4+  Knee flexion 5 5  Knee extension 5 5  Ankle dorsiflexion 4 4+  Ankle plantarflexion 5- 5  Ankle inversion    Ankle eversion     (Blank rows = not tested)    TODAY'S TREATMENT:   09/01/22 THERAPEUTIC EXERCISE: to improve flexibility, strength and mobility.  Verbal and tactile cues throughout for technique. Seated hip hinge R figure-4 piriformis stretch x 30" Seated R KTOS piriformis stretch x 30" Seated hip hinge R HS stretch with strap x 30" Standing lateral lean R ITB stretch with strap x 30", leaning on back of chair for balance Seated lunge position R hip flexor/quad stretch x 30" Bridge + GTB hip ABD isometric 10 x 5" S/L R GTB clam 10 x 3"  MANUAL THERAPY: To promote normalized muscle tension, improved flexibility, increased ROM, and reduced pain. Skilled palpation and monitoring of soft tissue during DN Trigger Point Dry-Needling  Treatment instructions: Expect mild to moderate muscle soreness. S/S of pneumothorax if dry needled over a lung field, and to seek immediate medical attention should they occur.  Patient verbalized understanding of these instructions and education. Patient Consent Given: Yes Education handout provided: Yes Muscles treated: R glute maximus, medius & minimus, Rpiriformis Electrical stimulation performed: No Parameters: N/A Treatment response/outcome: Twitch Response Elicited and Palpable Increase in Muscle Length STM/DTM, manual TPR and pin & stretch to muscles addressed with DN   08/29/22 THERAPEUTIC EXERCISE: to improve flexibility, strength and mobility.  Verbal and tactile cues throughout for technique. Supine R SKTC x 30" Hooklying R figure-4 piriformis stretch x 30" Hooklying R KTOS piriformis stretch x 30" Hooklying R heel to hip piriformis stretch x 30" - deferred from HEP due to increased knee discomfort Supine R HS stretch with strap x 30" Supine R cross-body ITB stretch with strap x 30" Mod Thomas R hip flexor/quad stretch with strap x 30"   PATIENT EDUCATION:  Education details: HEP update - seated alternatives for stretches, HEP progression - hip strengthening, role of DN, and DN rational, procedure, outcomes, potential side effects, and recommended post-treatment exercises/activity Person educated: Patient Education method: Explanation, Demonstration, Verbal cues, and Handouts Education comprehension: verbalized understanding, returned demonstration, verbal cues required, and needs further education  HOME EXERCISE PROGRAM: Access Code: [email or web address] URL: [email or web address] Date: 09/01/2022 Prepared by: Glenetta Hew  Exercises - Supine Single Knee to Chest Stretch  - 2-3 x daily - 7 x weekly - 3 reps - 30 sec hold - Supine Figure 4 Piriformis Stretch  -  2-3 x daily - 7 x weekly - 3 reps - 30 sec hold - Supine Piriformis Stretch with Foot on Ground  - 2-3 x daily - 7 x weekly - 3 reps - 30 sec hold - Supine Hamstring Stretch with Strap  - 2-3 x daily - 7 x weekly - 3 reps - 30 sec hold - Supine Iliotibial Band Stretch with Strap  -  2-3 x daily - 7 x weekly - 3 reps - 30 sec hold - Supine Quadriceps Stretch with Strap on Table (Mirrored)  - 2-3 x daily - 7 x weekly - 3 reps - 30 sec hold - Seated Hamstring Stretch  - 2-3 x daily - 7 x weekly - 3 reps - 30 sec hold - Seated Figure 4 Piriformis Stretch (Mirrored)  - 2 x daily - 7 x weekly - 3 reps - 30 sec hold - Seated Piriformis Stretch (Mirrored)  - 2-3 x daily - 7 x weekly - 3 reps - 30 sec hold - Seated Hip Flexor Stretch  - 2-3 x daily - 7 x weekly - 3 reps - 30 sec hold - Standing ITB Stretch  - 2-3 x daily - 7 x weekly - 3 reps - 30 sec hold - Supine Bridge with Resistance Band  - 1 x daily - 7 x weekly - 2 sets - 10 reps - 5 sec hold - Clam with Resistance  - 1 x daily - 7 x weekly - 2 sets - 10 reps - 3-5 sec hold - Isometric Squat with Resistance Loop  - 1 x daily - 7 x weekly - 2 sets - 10 reps - 3-5 sec hold  Patient Education - Trigger Point Dry Needling - TENS Therapy   ASSESSMENT:  CLINICAL IMPRESSION: [name] [name]" reports he has been performing the HEP 3x/day most days including before playing tennis but found it difficult to stretch during breaks in the game as all of the exercises/stretches are laying down.  Instructed pt in seated alternatives for the stretches to allow for increased convenience of performance when unable to lay down.  MT incorporating DN upon informed pt consent utilized to address increased muscle tension in R glutes and piriformis with good twitch responses elicited resulting in palpable reduction in muscle tension.  Introduced hip strengthening targeting glutes and piriformis with good tolerance.  HEP updated to reflect seated alternatives for stretches as well as strengthening progression.  [name] notes that his wife has a TENS unit and he inquired about if this might be useful - explained rationale for TENS estim and described/demonstrated recommended positioning for electrode placement as well as encouraging pt to bring the TENS unit with  him to a future session if he would like more guidance in setup and use.  OBJECTIVE IMPAIRMENTS: decreased activity tolerance, decreased endurance, decreased mobility, difficulty walking, decreased ROM, decreased strength, hypomobility, increased fascial restrictions, impaired perceived functional ability, increased muscle spasms, impaired flexibility, and pain.   ACTIVITY LIMITATIONS: squatting, bed mobility, dressing, locomotion level, and caring for others  PARTICIPATION LIMITATIONS: interpersonal relationship, community activity, yard work, and recreational activities  PERSONAL FACTORS: Past/current experiences, Time since onset of injury/illness/exacerbation, and 3+ comorbidities: R hip impingement syndrome, HTN, GERD, cancer - malignant tumor of small bowel  are also affecting patient's functional outcome.   REHAB POTENTIAL: Good  CLINICAL DECISION MAKING: Stable/uncomplicated  EVALUATION COMPLEXITY: Low   GOALS: Goals reviewed with patient? Yes  SHORT TERM GOALS: Target date: 09/19/2022  Patient will be independent with  initial HEP. Baseline:  Goal status: IN PROGRESS  LONG TERM GOALS: Target date: 10/10/2022  Patient will be independent with advanced/ongoing HEP to improve outcomes and carryover.  Baseline:  Goal status: IN PROGRESS  2.  Patient will report at least 50-75% improvement in R hip pain to improve QOL. Baseline: Pain up to 10/10 Goal status: IN PROGRESS  3.  Patient will demonstrate improved B LE strength to 5/5 for improved stability and ease of mobility. Baseline: Refer to above MMT table Goal status: IN PROGRESS  4.  Patient will be able to ambulate 600' with normal gait pattern without increased pain to access community.  Baseline: Limited walking tolerance due to pain Goal status: IN PROGRESS  5.  Patient will report >/= 78% on LEFS to demonstrate improved functional ability. Baseline: 55 / 80 = 68.8 % Goal status: IN PROGRESS  6.  Patient will  report ability to play tennis or pickle ball without limitation due to R hip pain. Baseline:  Goal status: IN PROGRESS  6.  Patient will report ability to mow his lawn without limitation due to R hip pain. Baseline:  Goal status: IN PROGRESS    PLAN:  PT FREQUENCY: 2x/week  PT DURATION: 4-6 weeks  PLANNED INTERVENTIONS: Therapeutic exercises, Therapeutic activity, Neuromuscular re-education, Balance training, Gait training, Patient/Family education, Self Care, Joint mobilization, Dry Needling, Electrical stimulation, Spinal manipulation, Spinal mobilization, Cryotherapy, Moist heat, Taping, Ultrasound, Ionotophoresis 4mg /ml Dexamethasone, Manual therapy, and Re-evaluation  PLAN FOR NEXT SESSION: Review initial HEP; progress hip flexibility & strengthening; MT +/- DN to R glutes, piriformis and hip flexors   Marry Guan, PT 09/01/2022, 9:08 AM

## 2022-09-05 ENCOUNTER — Ambulatory Visit: Payer: Medicare Other | Admitting: Physical Therapy

## 2022-09-05 ENCOUNTER — Encounter: Payer: Self-pay | Admitting: Physical Therapy

## 2022-09-05 ENCOUNTER — Telehealth: Payer: Self-pay | Admitting: Family Medicine

## 2022-09-05 DIAGNOSIS — M25551 Pain in right hip: Secondary | ICD-10-CM

## 2022-09-05 DIAGNOSIS — M62838 Other muscle spasm: Secondary | ICD-10-CM

## 2022-09-05 DIAGNOSIS — M6281 Muscle weakness (generalized): Secondary | ICD-10-CM

## 2022-09-05 DIAGNOSIS — R262 Difficulty in walking, not elsewhere classified: Secondary | ICD-10-CM

## 2022-09-05 NOTE — Therapy (Signed)
OUTPATIENT PHYSICAL THERAPY TREATMENT   Patient Name: [name] [name] MRN: [id number] DOB:[birth year], 67 y.o., male Today's Date: 09/05/2022   END OF SESSION:  PT End of Session - 09/05/22 0756     Visit Number 3    Date for PT Re-Evaluation 10/10/22    Authorization Type Medicare & [employer]    Progress Note Due on Visit 10    PT Start Time 0756    PT Stop Time 0845    PT Time Calculation (min) 49 min    Activity Tolerance Patient tolerated treatment well    Behavior During Therapy [facility] for tasks assessed/performed             Past Medical History:  Diagnosis Date   Ankle fracture    Cancer (HCC) 1974   testicular cancer-at age 18   Cataract    bilateral sx   GERD (gastroesophageal reflux disease)    hx of   Hx of colonic polyps    pt unsure,thinks this was done in 1982 in [place]. no reports in EPIC   Hyperlipidemia    on meds   Hypertension    on meds   Kidney stones    Patella fracture    Retinal detachment    OD   Retinal tear of right eye    Syncopal episodes 2008   Past Surgical History:  Procedure Laterality Date   AIR/FLUID EXCHANGE Right 06/16/2014   Procedure: AIR/FLUID EXCHANGE;  Surgeon: Sherrie George, MD;  Location: [facility] OR;  Service: Ophthalmology;  Laterality: Right;   BIOPSY  04/04/2021   Procedure: BIOPSY;  Surgeon: Meridee Score Netty Starring., MD;  Location: [facility] ENDOSCOPY;  Service: Gastroenterology;;   CATARACT EXTRACTION     CATARACT EXTRACTION  09/2014   rt eye//left 01/2015   CATARACT EXTRACTION, BILATERAL  2016   per Dr. Elmer Picker    COLONOSCOPY  02/16/2020   per Dr. Rhea Belton, adenomatous polyps, repeat in 3 yrs   ENTEROSCOPY N/A 04/04/2021   Procedure: ENTEROSCOPY;  Surgeon: Mansouraty, Netty Starring., MD;  Location: [facility] ENDOSCOPY;  Service: Gastroenterology;  Laterality: N/A;   EYE SURGERY     GAS INSERTION Right 06/16/2014   Procedure: INSERTION OF GAS;  Surgeon: Sherrie George, MD;  Location: [facility] OR;  Service: Ophthalmology;  Laterality: Right;   [id number]   GIVENS CAPSULE STUDY N/A 04/02/2021   Procedure: GIVENS CAPSULE STUDY;  Surgeon: Lemar Lofty., MD;  Location: [facility] ENDOSCOPY;  Service: Gastroenterology;  Laterality: N/A;   HYDROCELE EXCISION     IR ANGIOGRAM VISCERAL SELECTIVE  04/04/2021   IR US GUIDE VASC ACCESS LEFT  04/04/2021   KNEE ARTHROSCOPY Right    x 2   LASER PHOTO ABLATION Right 06/16/2014   Procedure: LASER PHOTO ABLATION;  Surgeon: Sherrie George, MD;  Location: [facility] OR;  Service: Ophthalmology;  Laterality: Right;  Headscope laser and endolaser    ORCHIECTOMY     right testicle   REPAIR OF COMPLEX TRACTION RETINAL DETACHMENT Right 06/16/2014   Procedure: REPAIR OF COMPLEX TRACTION RETINAL DETACHMENT;  Surgeon: Sherrie George, MD;  Location: [facility] OR;  Service: Ophthalmology;  Laterality: Right;   RETINAL DETACHMENT SURGERY  06/2014   SUBMUCOSAL TATTOO INJECTION  04/04/2021   Procedure: SUBMUCOSAL TATTOO INJECTION;  Surgeon: Lemar Lofty., MD;  Location: [facility] ENDOSCOPY;  Service: Gastroenterology;;   TONSILLECTOMY     VITRECTOMY Right 06/16/2014   WISDOM TOOTH EXTRACTION     Patient Active Problem List   Diagnosis Date Noted  GIST (gastrointestinal stromal tumor) of small bowel, malignant (HCC) 07/26/2021   Acute GI bleeding 04/01/2021   Acute blood loss anemia 04/01/2021   Right hip impingement syndrome 08/18/2019   Aortic atherosclerosis (HCC) 07/10/2019   Elevated hemoglobin (HCC) 09/13/2017   Rhegmatogenous retinal detachment of right eye 06/16/2014   Hyperlipidemia 10/01/2007   Essential hypertension 10/01/2007   NEOPLASM, MALIGNANT, TESTES, HX OF 10/01/2007   COLONIC POLYPS, HX OF 10/01/2007   NEPHROLITHIASIS, HX OF 10/01/2007    PCP: [name], MD   REFERRING PROVIDER: Richardean Sale, DO   REFERRING DIAG:  [phone] (ICD-10-CM) - Right hip pain  M76.01 (ICD-10-CM) - Gluteal tendinitis, right hip   THERAPY DIAG:  Pain in right hip  Difficulty in walking, not elsewhere  classified  Other muscle spasm  Muscle weakness (generalized)  RATIONALE FOR EVALUATION AND TREATMENT: Rehabilitation  ONSET DATE: chronic x 10 yrs, worsening over past few years  NEXT MD VISIT: 09/12/22   SUBJECTIVE:                                                                                                                                                                                                         SUBJECTIVE STATEMENT: Pt reports good relief from DN. Able to walk around the park on Sat for the first time in years.  Tightness did come back while playing tennis yesterday, but not as severe.  PAIN: Are you having pain? Yes: NPRS scale: 0-2/10 Pain location: R buttock Pain description: tightness  PERTINENT HISTORY:  R hip impingement syndrome, HTN, GERD, malignant tumor of small bowel  PRECAUTIONS: None  WEIGHT BEARING RESTRICTIONS: No  FALLS:  Has patient fallen in last 6 months? No  LIVING ENVIRONMENT: Lives with: lives with their spouse Lives in: House/apartment Stairs: Yes: Internal: 14 steps; on left going up - bed/bath on main level Has following equipment at home: None  OCCUPATION: Retired  PLOF: Independent and Leisure: Tennis & pickle ball, walking daily (not with current pain)  PATIENT GOALS: "Eliminate the pain."   OBJECTIVE: (objective measures completed at initial evaluation unless otherwise dated)  DIAGNOSTIC FINDINGS:  07/17/22 - Hip & pelvis x-ray: IMPRESSION: Mild left and minimal right femoroacetabular osteoarthritis.  PATIENT SURVEYS:  LEFS 55 / 80 = 68.8 %  COGNITION: Overall cognitive status: Within functional limits for tasks assessed    SENSATION: WFL  MUSCLE LENGTH: Hamstrings: mod tight R>L ITB: mild tight R>L Piriformis: mod tight B Hip flexors: mod tight B Quads: mod tight B Heelcord: NT  POSTURE:  No Significant postural limitations  PALPATION: Increased muscle tension in R>L glutes and piriformis, hip  flexors.  No TTP over R greater trochanter.    LOWER EXTREMITY ROM: Lumbar ROM WNL Mildly limited B hip ROM - limitations most pronounced in hip extension & IR/ER  LOWER EXTREMITY MMT:  MMT Right eval Left eval  Hip flexion 4+ 5  Hip extension 4+ 4+  Hip abduction 4+ 5  Hip adduction 5 5  Hip internal rotation 4+ limited motion 5 limited motion  Hip external rotation 4 4+  Knee flexion 5 5  Knee extension 5 5  Ankle dorsiflexion 4 4+  Ankle plantarflexion 5- 5  Ankle inversion    Ankle eversion     (Blank rows = not tested)    TODAY'S TREATMENT:   09/05/22 THERAPEUTIC EXERCISE: to improve flexibility, strength and mobility.  Verbal and tactile cues throughout for technique. Rec Bike - L4 x 6 min Bridge + GTB hip ABD isometric 10 x 5" Sustained bridge + alt unilateral GTB hip ABD/ER x 10 - limited by repeated R HS cramping S/L R GTB clam 10 x 3" B side-stepping with looped GTB at ankles 2 x 25 ft each Fwd/back monster walk with looped GTB at ankles 2 x 25 ft each  SELF CARE:  Provided instruction in self-STM techniques to HS using beaded roller/rolling pin.  Provided instruction in self-STM techniques to glutes/piriformis using tennis ball on wall or in chair. Reviewed set-up and use of pt's wife's TENS unit as well as provided options for other home TENS units   09/01/22 THERAPEUTIC EXERCISE: to improve flexibility, strength and mobility.  Verbal and tactile cues throughout for technique. Seated hip hinge R figure-4 piriformis stretch x 30" Seated R KTOS piriformis stretch x 30" Seated hip hinge R HS stretch with strap x 30" Standing lateral lean R ITB stretch with strap x 30", leaning on back of chair for balance Seated lunge position R hip flexor/quad stretch x 30" Bridge + GTB hip ABD isometric 10 x 5" S/L R GTB clam 10 x 3"  MANUAL THERAPY: To promote normalized muscle tension, improved flexibility, increased ROM, and reduced pain. Skilled palpation and  monitoring of soft tissue during DN Trigger Point Dry-Needling  Treatment instructions: Expect mild to moderate muscle soreness. S/S of pneumothorax if dry needled over a lung field, and to seek immediate medical attention should they occur. Patient verbalized understanding of these instructions and education. Patient Consent Given: Yes Education handout provided: Yes Muscles treated: R glute maximus, medius & minimus, Rpiriformis Electrical stimulation performed: No Parameters: N/A Treatment response/outcome: Twitch Response Elicited and Palpable Increase in Muscle Length STM/DTM, manual TPR and pin & stretch to muscles addressed with DN   08/29/22 THERAPEUTIC EXERCISE: to improve flexibility, strength and mobility.  Verbal and tactile cues throughout for technique. Supine R SKTC x 30" Hooklying R figure-4 piriformis stretch x 30" Hooklying R KTOS piriformis stretch x 30" Hooklying R heel to hip piriformis stretch x 30" - deferred from HEP due to increased knee discomfort Supine R HS stretch with strap x 30" Supine R cross-body ITB stretch with strap x 30" Mod Thomas R hip flexor/quad stretch with strap x 30"   PATIENT EDUCATION:  Education details: HEP update - seated alternatives for stretches, HEP progression - hip strengthening, role of DN, and DN rational, procedure, outcomes, potential side effects, and recommended post-treatment exercises/activity Person educated: Patient Education method: Explanation, Demonstration, Verbal cues, and Handouts Education comprehension: verbalized understanding, returned demonstration, verbal cues required, and needs  further education  HOME EXERCISE PROGRAM: Access Code: [email or web address] URL: [email or web address] Date: 09/01/2022 Prepared by: Glenetta Hew  Exercises - Supine Single Knee to Chest Stretch  - 2-3 x daily - 7 x weekly - 3 reps - 30 sec hold - Supine Figure 4 Piriformis Stretch  - 2-3 x daily - 7 x weekly - 3 reps - 30 sec  hold - Supine Piriformis Stretch with Foot on Ground  - 2-3 x daily - 7 x weekly - 3 reps - 30 sec hold - Supine Hamstring Stretch with Strap  - 2-3 x daily - 7 x weekly - 3 reps - 30 sec hold - Supine Iliotibial Band Stretch with Strap  - 2-3 x daily - 7 x weekly - 3 reps - 30 sec hold - Supine Quadriceps Stretch with Strap on Table (Mirrored)  - 2-3 x daily - 7 x weekly - 3 reps - 30 sec hold - Seated Hamstring Stretch  - 2-3 x daily - 7 x weekly - 3 reps - 30 sec hold - Seated Figure 4 Piriformis Stretch (Mirrored)  - 2 x daily - 7 x weekly - 3 reps - 30 sec hold - Seated Piriformis Stretch (Mirrored)  - 2-3 x daily - 7 x weekly - 3 reps - 30 sec hold - Seated Hip Flexor Stretch  - 2-3 x daily - 7 x weekly - 3 reps - 30 sec hold - Standing ITB Stretch  - 2-3 x daily - 7 x weekly - 3 reps - 30 sec hold - Supine Bridge with Resistance Band  - 1 x daily - 7 x weekly - 2 sets - 10 reps - 5 sec hold - Clam with Resistance  - 1 x daily - 7 x weekly - 2 sets - 10 reps - 3-5 sec hold - Isometric Squat with Resistance Loop  - 1 x daily - 7 x weekly - 2 sets - 10 reps - 3-5 sec hold  Patient Education - Trigger Point Dry Needling - TENS Therapy   ASSESSMENT:  CLINICAL IMPRESSION: Adrion Stickrod" reports god relief from initial DN last session, noting increased tolerance for walking over the weekend. Progressed hip strengthening today with good tolerance for most exercises other than some DOE requiring intermittent rest breaks, but limited by R HS cramping during attempt at bridge + hip ABD/ER on R.  Education provided on set up and use of patient's wife's home TENS unit as well as the unit utilized in our clinic for management of pain at home.  Patient also instructed in self STM techniques utilizing tennis ball on wall or in chair for glutes and piriformis, or rolling pin/beaded roller for hamstrings. [name] reports some dizziness/lightheadedness at times when returning to stand after bending over,  therefore will plan to check for orthostatic hypotension next visit.  OBJECTIVE IMPAIRMENTS: decreased activity tolerance, decreased endurance, decreased mobility, difficulty walking, decreased ROM, decreased strength, hypomobility, increased fascial restrictions, impaired perceived functional ability, increased muscle spasms, impaired flexibility, and pain.   ACTIVITY LIMITATIONS: squatting, bed mobility, dressing, locomotion level, and caring for others  PARTICIPATION LIMITATIONS: interpersonal relationship, community activity, yard work, and recreational activities  PERSONAL FACTORS: Past/current experiences, Time since onset of injury/illness/exacerbation, and 3+ comorbidities: R hip impingement syndrome, HTN, GERD, cancer - malignant tumor of small bowel  are also affecting patient's functional outcome.   REHAB POTENTIAL: Good  CLINICAL DECISION MAKING: Stable/uncomplicated  EVALUATION COMPLEXITY: Low   GOALS: Goals reviewed with patient? Yes  SHORT TERM GOALS:  Target date: 09/19/2022  Patient will be independent with initial HEP. Baseline:  Goal status: IN PROGRESS  LONG TERM GOALS: Target date: 10/10/2022  Patient will be independent with advanced/ongoing HEP to improve outcomes and carryover.  Baseline:  Goal status: IN PROGRESS  2.  Patient will report at least 50-75% improvement in R hip pain to improve QOL. Baseline: Pain up to 10/10 Goal status: IN PROGRESS  3.  Patient will demonstrate improved B LE strength to 5/5 for improved stability and ease of mobility. Baseline: Refer to above MMT table Goal status: IN PROGRESS  4.  Patient will be able to ambulate 600' with normal gait pattern without increased pain to access community.  Baseline: Limited walking tolerance due to pain Goal status: IN PROGRESS  5.  Patient will report >/= 78% on LEFS to demonstrate improved functional ability. Baseline: 55 / 80 = 68.8 % Goal status: IN PROGRESS  6.  Patient will report  ability to play tennis or pickle ball without limitation due to R hip pain. Baseline:  Goal status: IN PROGRESS  6.  Patient will report ability to mow his lawn without limitation due to R hip pain. Baseline:  Goal status: IN PROGRESS    PLAN:  PT FREQUENCY: 2x/week  PT DURATION: 4-6 weeks  PLANNED INTERVENTIONS: Therapeutic exercises, Therapeutic activity, Neuromuscular re-education, Balance training, Gait training, Patient/Family education, Self Care, Joint mobilization, Dry Needling, Electrical stimulation, Spinal manipulation, Spinal mobilization, Cryotherapy, Moist heat, Taping, Ultrasound, Ionotophoresis 4mg /ml Dexamethasone, Manual therapy, and Re-evaluation  PLAN FOR NEXT SESSION: Check for orthostatic hypotension before warm-up; progress hip flexibility & strengthening - review/update HEP PRN; MT +/- DN to R glutes, piriformis, hip flexors and/or hamstrings as indicated   Marry Guan, PT 09/05/2022, 10:43 AM

## 2022-09-05 NOTE — Telephone Encounter (Signed)
Pt call and want a appt but was sent to the triage nurse and she recommend he go to the urgent care. Pt need a call back.

## 2022-09-05 NOTE — Telephone Encounter (Signed)
FYI Called pt back stated that he has been having dizziness when bending over, pt also has been experiencing more SOB which is an ongoing problem but getting worse with Exhaustion. Pt was Triaged and was advised to go to UC but declined. Pt was advised to check her BP which he did and the reading was 151/86. Pt denies headaches, chest pains, ear pain or other symptoms. Offered to see another provider at our office but declined stated that this can wait for next week 09/11/22 when PCP returns to the office. Pt is scheduled for an office visit with Dr Clent Ridges on 09/11/22 at 2.15 pm.

## 2022-09-05 NOTE — Progress Notes (Signed)
[name] [name] D.[facility] [address] [place] [address] Phone: [phone]   Assessment and Plan:     1. Right hip pain 2. Gluteal tendinitis, right hip  -Chronic with exacerbation, subsequent visit - Overall moderate improvement in gluteal pain and posterior hip pain with ongoing physical therapy, home exercises, dry needling.  This appears to be the most benefit patient has received from any treatment thus far - Recommend avoiding NSAIDs due to recent AKI using cancer medication - Discontinue prednisone.  Patient completed prednisone Dosepak without noticing any particular benefit - Continue HEP and physical therapy  Pertinent previous records reviewed include physical therapy note 09/11/2022, physical therapy note 09/05/2022, physical therapy note 09/01/2022   Follow Up: As needed if no improvement or worsening of symptoms.   Subjective:   I, [name] [name], am serving as a Neurosurgeon for Doctor Richardean Sale   Chief Complaint: right hip pain    HPI:    07/17/2022 Patient is a 67 year old male complaining of right hip pain. Patient states acute on chronic right hip pain. The pain seems to be occurring over the proximal lateral portion of the hip. It is a stabbing sensation when he walked in a straight line , that would resolve when he sat . Now that he plays pickle ball and is more active he he has more constant pain but as soon as he sits down the pain gone, no meds for the pain, no numbness or tingling , no radiating pain, he endorses an antalgic gait when he is playing tennis and pickle ball. Was frustrated with PT because there was no continuity     08/07/2022 Patient states back part of hip improved , but the front of the hip has the same level of pain that the back of the hip had    09/12/2022 Patient states that he is getting better , PT has helped , more stretching and dry needling is amazing   Relevant Historical Information:  Hypertension, history of GIST tumor  Additional pertinent review of systems negative.   Current Outpatient Medications:    Iron, Ferrous Sulfate, 325 (65 Fe) MG TABS, Take 325 mg by mouth 2 (two) times daily with a meal., Disp: 60 tablet, Rfl: 5   losartan (COZAAR) 50 MG tablet, Take 1 tablet (50 mg total) by mouth daily., Disp: 30 tablet, Rfl: 2   rosuvastatin (CRESTOR) 40 MG tablet, TAKE 1 TABLET BY MOUTH EVERY DAY, Disp: 90 tablet, Rfl: 0   sildenafil (VIAGRA) 100 MG tablet, Take 1 tablet (100 mg total) by mouth as needed for erectile dysfunction., Disp: 10 tablet, Rfl: 5   Syringe/Needle, Disp, (SYRINGE 3CC/22GX1-1/2") 22G X 1-1/2" 3 ML MISC, 1 Application by Does not apply route every 14 (fourteen) days., Disp: 50 each, Rfl: 0   testosterone cypionate (DEPOTESTOSTERONE CYPIONATE) 200 MG/ML injection, Inject 1 mL (200 mg total) into the muscle every 14 (fourteen) days., Disp: 10 mL, Rfl: 1   Objective:     Vitals:   09/12/22 0843  BP: 120/84  Pulse: 68  SpO2: 99%  Weight: 241 lb (109.3 kg)  Height: 5\' 11"  (1.803 m)      Body mass index is 33.61 kg/m.    Physical Exam:    General: awake, alert, and oriented no acute distress, nontoxic Skin: no suspicious lesions or rashes Neuro:sensation intact distally with no deficits, normal muscle tone, no atrophy, strength 5/5 in all tested lower ext groups Psych: normal mood  and affect, speech clear   Right hip: No deformity, swelling or wasting ROM Flexion 90, ext 30, IR 45, ER 45 NTTP over the hip flexors, greater trochanter, gluteal musculature, si joint, lumbar spine Negative log roll with FROM Negative FABER Negative FADIR Negative trendelenberg Gait normal     Electronically signed by:  [name] [name] D.[facility] 9:14 AM 09/12/22

## 2022-09-07 ENCOUNTER — Encounter: Payer: Self-pay | Admitting: Physical Therapy

## 2022-09-07 ENCOUNTER — Ambulatory Visit: Payer: Medicare Other | Admitting: Physical Therapy

## 2022-09-07 DIAGNOSIS — R262 Difficulty in walking, not elsewhere classified: Secondary | ICD-10-CM

## 2022-09-07 DIAGNOSIS — M25551 Pain in right hip: Secondary | ICD-10-CM

## 2022-09-07 DIAGNOSIS — M62838 Other muscle spasm: Secondary | ICD-10-CM

## 2022-09-07 DIAGNOSIS — M6281 Muscle weakness (generalized): Secondary | ICD-10-CM

## 2022-09-07 NOTE — Therapy (Addendum)
OUTPATIENT PHYSICAL THERAPY TREATMENT   Patient Name: [name] [name] MRN: [id number] DOB:07/22/1955, 67 y.o., male Today's Date: 09/07/2022   END OF SESSION:  PT End of Session - 09/07/22 0801     Visit Number 4    Date for PT Re-Evaluation 10/10/22    Authorization Type Medicare & [employer]    Progress Note Due on Visit 10    PT Start Time 0801    PT Stop Time 0843    PT Time Calculation (min) 42 min    Activity Tolerance Patient tolerated treatment well    Behavior During Therapy [facility] for tasks assessed/performed             Past Medical History:  Diagnosis Date   Ankle fracture    Cancer (HCC) 1974   testicular cancer-at age 47   Cataract    bilateral sx   GERD (gastroesophageal reflux disease)    hx of   Hx of colonic polyps    pt unsure,thinks this was done in 1982 in [place]. no reports in EPIC   Hyperlipidemia    on meds   Hypertension    on meds   Kidney stones    Patella fracture    Retinal detachment    OD   Retinal tear of right eye    Syncopal episodes 2008   Past Surgical History:  Procedure Laterality Date   AIR/FLUID EXCHANGE Right 06/16/2014   Procedure: AIR/FLUID EXCHANGE;  Surgeon: Sherrie George, MD;  Location: [facility] OR;  Service: Ophthalmology;  Laterality: Right;   BIOPSY  04/04/2021   Procedure: BIOPSY;  Surgeon: Meridee Score Netty Starring., MD;  Location: [facility] ENDOSCOPY;  Service: Gastroenterology;;   CATARACT EXTRACTION     CATARACT EXTRACTION  09/2014   rt eye//left 01/2015   CATARACT EXTRACTION, BILATERAL  2016   per Dr. Elmer Picker    COLONOSCOPY  02/16/2020   per Dr. Rhea Belton, adenomatous polyps, repeat in 3 yrs   ENTEROSCOPY N/A 04/04/2021   Procedure: ENTEROSCOPY;  Surgeon: Mansouraty, Netty Starring., MD;  Location: [facility] ENDOSCOPY;  Service: Gastroenterology;  Laterality: N/A;   EYE SURGERY     GAS INSERTION Right 06/16/2014   Procedure: INSERTION OF GAS;  Surgeon: Sherrie George, MD;  Location: [facility] OR;  Service: Ophthalmology;  Laterality: Right;   [id number]   GIVENS CAPSULE STUDY N/A 04/02/2021   Procedure: GIVENS CAPSULE STUDY;  Surgeon: Lemar Lofty., MD;  Location: [facility] ENDOSCOPY;  Service: Gastroenterology;  Laterality: N/A;   HYDROCELE EXCISION     IR ANGIOGRAM VISCERAL SELECTIVE  04/04/2021   IR US GUIDE VASC ACCESS LEFT  04/04/2021   KNEE ARTHROSCOPY Right    x 2   LASER PHOTO ABLATION Right 06/16/2014   Procedure: LASER PHOTO ABLATION;  Surgeon: Sherrie George, MD;  Location: [facility] OR;  Service: Ophthalmology;  Laterality: Right;  Headscope laser and endolaser    ORCHIECTOMY     right testicle   REPAIR OF COMPLEX TRACTION RETINAL DETACHMENT Right 06/16/2014   Procedure: REPAIR OF COMPLEX TRACTION RETINAL DETACHMENT;  Surgeon: Sherrie George, MD;  Location: [facility] OR;  Service: Ophthalmology;  Laterality: Right;   RETINAL DETACHMENT SURGERY  06/2014   SUBMUCOSAL TATTOO INJECTION  04/04/2021   Procedure: SUBMUCOSAL TATTOO INJECTION;  Surgeon: Lemar Lofty., MD;  Location: [facility] ENDOSCOPY;  Service: Gastroenterology;;   TONSILLECTOMY     VITRECTOMY Right 06/16/2014   WISDOM TOOTH EXTRACTION     Patient Active Problem List   Diagnosis Date Noted  GIST (gastrointestinal stromal tumor) of small bowel, malignant (HCC) 07/26/2021   Acute GI bleeding 04/01/2021   Acute blood loss anemia 04/01/2021   Right hip impingement syndrome 08/18/2019   Aortic atherosclerosis (HCC) 07/10/2019   Elevated hemoglobin (HCC) 09/13/2017   Rhegmatogenous retinal detachment of right eye 06/16/2014   Hyperlipidemia 10/01/2007   Essential hypertension 10/01/2007   NEOPLASM, MALIGNANT, TESTES, HX OF 10/01/2007   COLONIC POLYPS, HX OF 10/01/2007   NEPHROLITHIASIS, HX OF 10/01/2007    PCP: [name], MD   REFERRING PROVIDER: Richardean Sale, DO   REFERRING DIAG:  [phone] (ICD-10-CM) - Right hip pain  M76.01 (ICD-10-CM) - Gluteal tendinitis, right hip   THERAPY DIAG:  Pain in right hip  Difficulty in walking, not elsewhere  classified  Other muscle spasm  Muscle weakness (generalized)  RATIONALE FOR EVALUATION AND TREATMENT: Rehabilitation  ONSET DATE: chronic x 10 yrs, worsening over past few years  NEXT MD VISIT: 09/12/22   SUBJECTIVE:                                                                                                                                                                                                         SUBJECTIVE STATEMENT: Pt reports he has been feeling a little weak and dizzy recently. He only notes slight R glute pain when stretching.  PAIN: Are you having pain? Yes: NPRS scale: 0-2/10 Pain location: R buttock Pain description: tightness  PERTINENT HISTORY:  R hip impingement syndrome, HTN, GERD, malignant tumor of small bowel  PRECAUTIONS: None  WEIGHT BEARING RESTRICTIONS: No  FALLS:  Has patient fallen in last 6 months? No  LIVING ENVIRONMENT: Lives with: lives with their spouse Lives in: House/apartment Stairs: Yes: Internal: 14 steps; on left going up - bed/bath on main level Has following equipment at home: None  OCCUPATION: Retired  PLOF: Independent and Leisure: Tennis & pickle ball, walking daily (not with current pain)  PATIENT GOALS: "Eliminate the pain."   OBJECTIVE: (objective measures completed at initial evaluation unless otherwise dated)  DIAGNOSTIC FINDINGS:  07/17/22 - Hip & pelvis x-ray: IMPRESSION: Mild left and minimal right femoroacetabular osteoarthritis.  PATIENT SURVEYS:  LEFS 55 / 80 = 68.8 %  COGNITION: Overall cognitive status: Within functional limits for tasks assessed    SENSATION: WFL  MUSCLE LENGTH: Hamstrings: mod tight R>L ITB: mild tight R>L Piriformis: mod tight B Hip flexors: mod tight B Quads: mod tight B Heelcord: NT  POSTURE:  No Significant postural limitations  PALPATION: Increased muscle tension in R>L glutes and piriformis, hip flexors.  No TTP over R greater trochanter.    LOWER  EXTREMITY ROM: Lumbar ROM WNL Mildly limited B hip ROM - limitations most pronounced in hip extension & IR/ER  LOWER EXTREMITY MMT:  MMT Right eval Left eval  Hip flexion 4+ 5  Hip extension 4+ 4+  Hip abduction 4+ 5  Hip adduction 5 5  Hip internal rotation 4+ limited motion 5 limited motion  Hip external rotation 4 4+  Knee flexion 5 5  Knee extension 5 5  Ankle dorsiflexion 4 4+  Ankle plantarflexion 5- 5  Ankle inversion    Ankle eversion     (Blank rows = not tested)    TODAY'S TREATMENT:   09/07/22 THERAPEUTIC ACTIVITIES:  Orthostatic BP assessment:  Time BP HR Symptoms  Lying down 5 min 124/72 67 No dizziness/lightheadedness  Sitting 1-2 min 116/70 77 5/10 - dizziness/lightheadedness  Standing 1-2 min 114/68 82 No worsening of dizziness   THERAPEUTIC EXERCISE: to improve flexibility, strength and mobility.  Verbal and tactile cues throughout for technique. Rec Bike - L4 x 6 min  MANUAL THERAPY: To promote normalized muscle tension, improved flexibility, and reduced pain. Skilled palpation and monitoring of soft tissue during DN Trigger Point Dry-Needling  Treatment instructions: Expect mild to moderate muscle soreness. Patient verbalized understanding of these instructions and education. Patient Consent Given: Yes Education handout provided: Previously provided Muscles treated: R medial and mid glutes, R lateral piriformis Electrical stimulation performed: No Parameters: N/A Treatment response/outcome: Twitch Response Elicited and Palpable Increase in Muscle Length STM/DTM, manual TPR and pin & stretch to muscles addressed with DN   09/05/22 THERAPEUTIC EXERCISE: to improve flexibility, strength and mobility.  Verbal and tactile cues throughout for technique. Rec Bike - L4 x 6 min Bridge + GTB hip ABD isometric 10 x 5" Sustained bridge + alt unilateral GTB hip ABD/ER x 10 - limited by repeated R HS cramping S/L R GTB clam 10 x 3" B side-stepping with  looped GTB at ankles 2 x 25 ft each Fwd/back monster walk with looped GTB at ankles 2 x 25 ft each  SELF CARE:  Provided instruction in self-STM techniques to HS using beaded roller/rolling pin.  Provided instruction in self-STM techniques to glutes/piriformis using tennis ball on wall or in chair. Reviewed set-up and use of pt's wife's TENS unit as well as provided options for other home TENS units   09/01/22 THERAPEUTIC EXERCISE: to improve flexibility, strength and mobility.  Verbal and tactile cues throughout for technique. Seated hip hinge R figure-4 piriformis stretch x 30" Seated R KTOS piriformis stretch x 30" Seated hip hinge R HS stretch with strap x 30" Standing lateral lean R ITB stretch with strap x 30", leaning on back of chair for balance Seated lunge position R hip flexor/quad stretch x 30" Bridge + GTB hip ABD isometric 10 x 5" S/L R GTB clam 10 x 3"  MANUAL THERAPY: To promote normalized muscle tension, improved flexibility, increased ROM, and reduced pain. Skilled palpation and monitoring of soft tissue during DN Trigger Point Dry-Needling  Treatment instructions: Expect mild to moderate muscle soreness. S/S of pneumothorax if dry needled over a lung field, and to seek immediate medical attention should they occur. Patient verbalized understanding of these instructions and education. Patient Consent Given: Yes Education handout provided: Yes Muscles treated: R glute maximus, medius & minimus, Rpiriformis Electrical stimulation performed: No Parameters: N/A Treatment response/outcome: Twitch Response Elicited and Palpable Increase in Muscle Length STM/DTM, manual TPR and pin &  stretch to muscles addressed with DN   PATIENT EDUCATION:  Education details: HEP update - seated alternatives for stretches, HEP progression - hip strengthening, role of DN, and DN rational, procedure, outcomes, potential side effects, and recommended post-treatment exercises/activity Person  educated: Patient Education method: Explanation, Demonstration, Verbal cues, and Handouts Education comprehension: verbalized understanding, returned demonstration, verbal cues required, and needs further education  HOME EXERCISE PROGRAM: Access Code: [email or web address] URL: [email or web address] Date: 09/01/2022 Prepared by: Glenetta Hew  Exercises - Supine Single Knee to Chest Stretch  - 2-3 x daily - 7 x weekly - 3 reps - 30 sec hold - Supine Figure 4 Piriformis Stretch  - 2-3 x daily - 7 x weekly - 3 reps - 30 sec hold - Supine Piriformis Stretch with Foot on Ground  - 2-3 x daily - 7 x weekly - 3 reps - 30 sec hold - Supine Hamstring Stretch with Strap  - 2-3 x daily - 7 x weekly - 3 reps - 30 sec hold - Supine Iliotibial Band Stretch with Strap  - 2-3 x daily - 7 x weekly - 3 reps - 30 sec hold - Supine Quadriceps Stretch with Strap on Table (Mirrored)  - 2-3 x daily - 7 x weekly - 3 reps - 30 sec hold - Seated Hamstring Stretch  - 2-3 x daily - 7 x weekly - 3 reps - 30 sec hold - Seated Figure 4 Piriformis Stretch (Mirrored)  - 2 x daily - 7 x weekly - 3 reps - 30 sec hold - Seated Piriformis Stretch (Mirrored)  - 2-3 x daily - 7 x weekly - 3 reps - 30 sec hold - Seated Hip Flexor Stretch  - 2-3 x daily - 7 x weekly - 3 reps - 30 sec hold - Standing ITB Stretch  - 2-3 x daily - 7 x weekly - 3 reps - 30 sec hold - Supine Bridge with Resistance Band  - 1 x daily - 7 x weekly - 2 sets - 10 reps - 5 sec hold - Clam with Resistance  - 1 x daily - 7 x weekly - 2 sets - 10 reps - 3-5 sec hold - Isometric Squat with Resistance Loop  - 1 x daily - 7 x weekly - 2 sets - 10 reps - 3-5 sec hold  Patient Education - Trigger Point Dry Needling - TENS Therapy   ASSESSMENT:  CLINICAL IMPRESSION: Assessed for orthostatic hypotension with blood pressure drop noted from supine to sit and associated dizziness noted but not to level consistent with formal orthostatic hypotension. Pt counseled  on precautions with change in positioning to allow for body/BP to respond to change in circulatory demand.  [name] "[name] [name]" noting benefit from initial DN last week and expressing interest in proceeding with this again today.  DN incorporated into MT addressing taut bands/TPs in medial/mid glutes and lateral piriformis with good twitch responses elicited resulting in palpable reduction in muscle tension.  [name] [name] reports he has not had the opportunity to attempt the strengthening progression introduced last visit, as he has been mostly focused on the stretches.  Will plan to review these next visit as needed and continue to progress strengthening as tolerated in upcoming visits.  OBJECTIVE IMPAIRMENTS: decreased activity tolerance, decreased endurance, decreased mobility, difficulty walking, decreased ROM, decreased strength, hypomobility, increased fascial restrictions, impaired perceived functional ability, increased muscle spasms, impaired flexibility, and pain.   ACTIVITY LIMITATIONS: squatting, bed mobility, dressing, locomotion level, and caring for others  PARTICIPATION  LIMITATIONS: interpersonal relationship, community activity, yard work, and recreational activities  PERSONAL FACTORS: Past/current experiences, Time since onset of injury/illness/exacerbation, and 3+ comorbidities: R hip impingement syndrome, HTN, GERD, cancer - malignant tumor of small bowel  are also affecting patient's functional outcome.   REHAB POTENTIAL: Good  CLINICAL DECISION MAKING: Stable/uncomplicated  EVALUATION COMPLEXITY: Low   GOALS: Goals reviewed with patient? Yes  SHORT TERM GOALS: Target date: 09/19/2022  Patient will be independent with initial HEP. Baseline:  Goal status: IN PROGRESS  09/07/22 - good comfort with stretches, limited performance of strengthening exercises thus far  LONG TERM GOALS: Target date: 10/10/2022  Patient will be independent with advanced/ongoing HEP to improve outcomes and  carryover.  Baseline:  Goal status: IN PROGRESS  2.  Patient will report at least 50-75% improvement in R hip pain to improve QOL. Baseline: Pain up to 10/10 Goal status: IN PROGRESS  3.  Patient will demonstrate improved B LE strength to 5/5 for improved stability and ease of mobility. Baseline: Refer to above MMT table Goal status: IN PROGRESS  4.  Patient will be able to ambulate 600' with normal gait pattern without increased pain to access community.  Baseline: Limited walking tolerance due to pain Goal status: IN PROGRESS  5.  Patient will report >/= 78% on LEFS to demonstrate improved functional ability. Baseline: 55 / 80 = 68.8 % Goal status: IN PROGRESS  6.  Patient will report ability to play tennis or pickle ball without limitation due to R hip pain. Baseline:  Goal status: IN PROGRESS  6.  Patient will report ability to mow his lawn without limitation due to R hip pain. Baseline:  Goal status: IN PROGRESS    PLAN:  PT FREQUENCY: 2x/week  PT DURATION: 4-6 weeks  PLANNED INTERVENTIONS: Therapeutic exercises, Therapeutic activity, Neuromuscular re-education, Balance training, Gait training, Patient/Family education, Self Care, Joint mobilization, Dry Needling, Electrical stimulation, Spinal manipulation, Spinal mobilization, Cryotherapy, Moist heat, Taping, Ultrasound, Ionotophoresis 4mg /ml Dexamethasone, Manual therapy, and Re-evaluation  PLAN FOR NEXT SESSION: progress hip flexibility & strengthening - review/update HEP PRN; MT +/- DN to R glutes, piriformis, hip flexors and/or hamstrings as indicated   Marry Guan, PT 09/07/2022, 1:17 PM

## 2022-09-11 ENCOUNTER — Encounter: Payer: Self-pay | Admitting: Physical Therapy

## 2022-09-11 ENCOUNTER — Ambulatory Visit (INDEPENDENT_AMBULATORY_CARE_PROVIDER_SITE_OTHER): Payer: Medicare Other | Admitting: Family Medicine

## 2022-09-11 ENCOUNTER — Encounter: Payer: Self-pay | Admitting: Family Medicine

## 2022-09-11 ENCOUNTER — Ambulatory Visit: Payer: Medicare Other | Attending: Sports Medicine | Admitting: Physical Therapy

## 2022-09-11 ENCOUNTER — Ambulatory Visit (INDEPENDENT_AMBULATORY_CARE_PROVIDER_SITE_OTHER): Payer: Medicare Other

## 2022-09-11 VITALS — BP 110/70 | HR 74 | Temp 98.1°F | Wt 241.0 lb

## 2022-09-11 DIAGNOSIS — M62838 Other muscle spasm: Secondary | ICD-10-CM | POA: Diagnosis present

## 2022-09-11 DIAGNOSIS — R262 Difficulty in walking, not elsewhere classified: Secondary | ICD-10-CM | POA: Diagnosis present

## 2022-09-11 DIAGNOSIS — R0602 Shortness of breath: Secondary | ICD-10-CM

## 2022-09-11 DIAGNOSIS — M25551 Pain in right hip: Secondary | ICD-10-CM | POA: Insufficient documentation

## 2022-09-11 DIAGNOSIS — N1832 Chronic kidney disease, stage 3b: Secondary | ICD-10-CM

## 2022-09-11 DIAGNOSIS — N183 Chronic kidney disease, stage 3 unspecified: Secondary | ICD-10-CM | POA: Insufficient documentation

## 2022-09-11 DIAGNOSIS — M6281 Muscle weakness (generalized): Secondary | ICD-10-CM | POA: Insufficient documentation

## 2022-09-11 DIAGNOSIS — R058 Other specified cough: Secondary | ICD-10-CM

## 2022-09-11 DIAGNOSIS — I10 Essential (primary) hypertension: Secondary | ICD-10-CM

## 2022-09-11 DIAGNOSIS — T464X5A Adverse effect of angiotensin-converting-enzyme inhibitors, initial encounter: Secondary | ICD-10-CM

## 2022-09-11 MED ORDER — LOSARTAN POTASSIUM 50 MG PO TABS
50.0000 mg | ORAL_TABLET | Freq: Every day | ORAL | 2 refills | Status: DC
Start: 2022-09-11 — End: 2022-10-31

## 2022-09-11 NOTE — Progress Notes (Signed)
   Subjective:    Patient ID: [name] [name], male    DOB: 1955-04-19, 67 y.o.   MRN: [id number]  HPI Here to follow up on SOB on exertion and occasional lightheadedness when exercising. He still complains of getting these symptoms when he plays tennis or pickle ball. No chest pain. He denies any wheezing, but when I ask about any cough he admits to having a dry tickling cough for the past year. We sent him for an ECHO on 07-31-22 and this showed an EF of 60-65% with Grade I diastolic dysfunction. When he last saw his nephrologist, Dr. Regan Rakers, she stopped his Amlodipine and doubled his Lisinopril HCT to 20-12.5 BID. Since then his BP has been running low at home, often in the range of 100-110 over 60-70.    Review of Systems  Constitutional: Negative.   Respiratory:  Positive for cough and shortness of breath. Negative for wheezing.   Cardiovascular: Negative.   Neurological:  Positive for light-headedness. Negative for dizziness and headaches.       Objective:   Physical Exam Constitutional:      Appearance: Normal appearance.  Cardiovascular:     Rate and Rhythm: Normal rate and regular rhythm.     Pulses: Normal pulses.     Heart sounds: Normal heart sounds.  Pulmonary:     Effort: Pulmonary effort is normal.     Breath sounds: Normal breath sounds.  Musculoskeletal:     Right lower leg: No edema.     Left lower leg: No edema.  Neurological:     Mental Status: He is alert.           Assessment & Plan:  First of all, part of his SOB on exertion can be explained by his low BP. Also his dry cough is likely a side effect of his ACE inhibitor, and this may also be contributing to his SOB. We will stop the Lisinopril HCT and start him on Losartan 50 mg daily. We can up this to 100 mg if needed. We will also get a CXR today. He will watch the BP at home, and he will see Dr. Regan Rakers in the next week or two. Gershon Crane, MD

## 2022-09-11 NOTE — Therapy (Signed)
OUTPATIENT PHYSICAL THERAPY TREATMENT   Patient Name: [name] [name] MRN: [id number] DOB:1956/01/19, 67 y.o., male Today's Date: 09/11/2022   END OF SESSION:  PT End of Session - 09/11/22 0803     Visit Number 5    Date for PT Re-Evaluation 10/10/22    Authorization Type Medicare & [employer]    Progress Note Due on Visit 10    PT Start Time 0803    PT Stop Time 0849    PT Time Calculation (min) 46 min    Activity Tolerance Patient tolerated treatment well    Behavior During Therapy [facility] for tasks assessed/performed             Past Medical History:  Diagnosis Date   Ankle fracture    Cancer (HCC) 1974   testicular cancer-at age 69   Cataract    bilateral sx   GERD (gastroesophageal reflux disease)    hx of   Hx of colonic polyps    pt unsure,thinks this was done in 1982 in [place]. no reports in EPIC   Hyperlipidemia    on meds   Hypertension    on meds   Kidney stones    Patella fracture    Retinal detachment    OD   Retinal tear of right eye    Syncopal episodes 2008   Past Surgical History:  Procedure Laterality Date   AIR/FLUID EXCHANGE Right 06/16/2014   Procedure: AIR/FLUID EXCHANGE;  Surgeon: Sherrie George, MD;  Location: [facility] OR;  Service: Ophthalmology;  Laterality: Right;   BIOPSY  04/04/2021   Procedure: BIOPSY;  Surgeon: Meridee Score Netty Starring., MD;  Location: [facility] ENDOSCOPY;  Service: Gastroenterology;;   CATARACT EXTRACTION     CATARACT EXTRACTION  09/2014   rt eye//left 01/2015   CATARACT EXTRACTION, BILATERAL  2016   per Dr. Elmer Picker    COLONOSCOPY  02/16/2020   per Dr. Rhea Belton, adenomatous polyps, repeat in 3 yrs   ENTEROSCOPY N/A 04/04/2021   Procedure: ENTEROSCOPY;  Surgeon: Mansouraty, Netty Starring., MD;  Location: [facility] ENDOSCOPY;  Service: Gastroenterology;  Laterality: N/A;   EYE SURGERY     GAS INSERTION Right 06/16/2014   Procedure: INSERTION OF GAS;  Surgeon: Sherrie George, MD;  Location: [facility] OR;  Service: Ophthalmology;  Laterality: Right;  [id number]    GIVENS CAPSULE STUDY N/A 04/02/2021   Procedure: GIVENS CAPSULE STUDY;  Surgeon: Lemar Lofty., MD;  Location: [facility] ENDOSCOPY;  Service: Gastroenterology;  Laterality: N/A;   HYDROCELE EXCISION     IR ANGIOGRAM VISCERAL SELECTIVE  04/04/2021   IR US GUIDE VASC ACCESS LEFT  04/04/2021   KNEE ARTHROSCOPY Right    x 2   LASER PHOTO ABLATION Right 06/16/2014   Procedure: LASER PHOTO ABLATION;  Surgeon: Sherrie George, MD;  Location: [facility] OR;  Service: Ophthalmology;  Laterality: Right;  Headscope laser and endolaser    ORCHIECTOMY     right testicle   REPAIR OF COMPLEX TRACTION RETINAL DETACHMENT Right 06/16/2014   Procedure: REPAIR OF COMPLEX TRACTION RETINAL DETACHMENT;  Surgeon: Sherrie George, MD;  Location: [facility] OR;  Service: Ophthalmology;  Laterality: Right;   RETINAL DETACHMENT SURGERY  06/2014   SUBMUCOSAL TATTOO INJECTION  04/04/2021   Procedure: SUBMUCOSAL TATTOO INJECTION;  Surgeon: Lemar Lofty., MD;  Location: [facility] ENDOSCOPY;  Service: Gastroenterology;;   TONSILLECTOMY     VITRECTOMY Right 06/16/2014   WISDOM TOOTH EXTRACTION     Patient Active Problem List   Diagnosis Date Noted  GIST (gastrointestinal stromal tumor) of small bowel, malignant (HCC) 07/26/2021   Acute GI bleeding 04/01/2021   Acute blood loss anemia 04/01/2021   Right hip impingement syndrome 08/18/2019   Aortic atherosclerosis (HCC) 07/10/2019   Elevated hemoglobin (HCC) 09/13/2017   Rhegmatogenous retinal detachment of right eye 06/16/2014   Hyperlipidemia 10/01/2007   Essential hypertension 10/01/2007   NEOPLASM, MALIGNANT, TESTES, HX OF 10/01/2007   COLONIC POLYPS, HX OF 10/01/2007   NEPHROLITHIASIS, HX OF 10/01/2007    PCP: [name], MD   REFERRING PROVIDER: Richardean Sale, DO   REFERRING DIAG:  [phone] (ICD-10-CM) - Right hip pain  M76.01 (ICD-10-CM) - Gluteal tendinitis, right hip   THERAPY DIAG:  Pain in right hip  Difficulty in walking, not elsewhere  classified  Other muscle spasm  Muscle weakness (generalized)  RATIONALE FOR EVALUATION AND TREATMENT: Rehabilitation  ONSET DATE: chronic x 10 yrs, worsening over past few years  NEXT MD VISIT: 09/12/22   SUBJECTIVE:                                                                                                                                                                                                         SUBJECTIVE STATEMENT: Hip is getting better.  Been stretching the back so front has been hurting, so bought some electrodes so it feels better.   Pain still very intermittent.   Has dr. Alfonzo Beers this afternoon about his shortness of breath and BP issues.    PAIN: Are you having pain? Yes: NPRS scale: 0/10 Pain location: R buttock Pain description: tightness  PERTINENT HISTORY:  R hip impingement syndrome, HTN, GERD, malignant tumor of small bowel  PRECAUTIONS: None  WEIGHT BEARING RESTRICTIONS: No  FALLS:  Has patient fallen in last 6 months? No  LIVING ENVIRONMENT: Lives with: lives with their spouse Lives in: House/apartment Stairs: Yes: Internal: 14 steps; on left going up - bed/bath on main level Has following equipment at home: None  OCCUPATION: Retired  PLOF: Independent and Leisure: Tennis & pickle ball, walking daily (not with current pain)  PATIENT GOALS: "Eliminate the pain."   OBJECTIVE: (objective measures completed at initial evaluation unless otherwise dated)  DIAGNOSTIC FINDINGS:  07/17/22 - Hip & pelvis x-ray: IMPRESSION: Mild left and minimal right femoroacetabular osteoarthritis.  PATIENT SURVEYS:  LEFS 55 / 80 = 68.8 %  COGNITION: Overall cognitive status: Within functional limits for tasks assessed    SENSATION: WFL  MUSCLE LENGTH: Hamstrings: mod tight R>L ITB: mild tight R>L Piriformis: mod tight B Hip flexors: mod tight B Quads: mod  tight B Heelcord: NT  POSTURE:  No Significant postural  limitations  PALPATION: Increased muscle tension in R>L glutes and piriformis, hip flexors.  No TTP over R greater trochanter.    LOWER EXTREMITY ROM: Lumbar ROM WNL Mildly limited B hip ROM - limitations most pronounced in hip extension & IR/ER  LOWER EXTREMITY MMT:  MMT Right eval Left eval  Hip flexion 4+ 5  Hip extension 4+ 4+  Hip abduction 4+ 5  Hip adduction 5 5  Hip internal rotation 4+ limited motion 5 limited motion  Hip external rotation 4 4+  Knee flexion 5 5  Knee extension 5 5  Ankle dorsiflexion 4 4+  Ankle plantarflexion 5- 5  Ankle inversion    Ankle eversion     (Blank rows = not tested)    TODAY'S TREATMENT:  09/11/22 Therapeutic Exercise: to improve strength and mobility.  Demo, verbal and tactile cues throughout for technique. Bike L4 x 6 min  Hip flexor stretch in supine 5 x 20 sec R - educated on progression, very tight Single leg squat with heel tap - starting on 2" step, with UE support, decreased to floor without support, 2 x 10 bil  4-  way heel tap reaches x 5 each  Single leg RDLs 2 x 10 bil - chair in front for safety, intermittent UE support needed.  Review and progression HEP Manual Therapy: to decrease muscle spasm and pain and improve mobility STM/TPR to R piriformis, glutes.    09/07/22 THERAPEUTIC ACTIVITIES:  Orthostatic BP assessment:  Time BP HR Symptoms  Lying down 5 min 124/72 67 No dizziness/lightheadedness  Sitting 1-2 min 116/70 77 5/10 - dizziness/lightheadedness  Standing 1-2 min 114/68 82 No worsening of dizziness   THERAPEUTIC EXERCISE: to improve flexibility, strength and mobility.  Verbal and tactile cues throughout for technique. Rec Bike - L4 x 6 min Hip flexor stretch  MANUAL THERAPY: To promote normalized muscle tension, improved flexibility, and reduced pain. Skilled palpation and monitoring of soft tissue during DN Trigger Point Dry-Needling  Treatment instructions: Expect mild to moderate muscle soreness.  S/S of pneumothorax if dry needled over a lung field, and to seek immediate medical attention should they occur. Patient verbalized understanding of these instructions and education. Patient Consent Given: Yes Education handout provided: Previously provided Muscles treated: R medial and mid glutes, R lateral piriformis Electrical stimulation performed: No Parameters: N/A Treatment response/outcome: Twitch Response Elicited and Palpable Increase in Muscle Length STM/DTM, manual TPR and pin & stretch to muscles addressed with DN   09/05/22 THERAPEUTIC EXERCISE: to improve flexibility, strength and mobility.  Verbal and tactile cues throughout for technique. Rec Bike - L4 x 6 min Bridge + GTB hip ABD isometric 10 x 5" Sustained bridge + alt unilateral GTB hip ABD/ER x 10 - limited by repeated R HS cramping S/L R GTB clam 10 x 3" B side-stepping with looped GTB at ankles 2 x 25 ft each Fwd/back monster walk with looped GTB at ankles 2 x 25 ft each  SELF CARE:  Provided instruction in self-STM techniques to HS using beaded roller/rolling pin.  Provided instruction in self-STM techniques to glutes/piriformis using tennis ball on wall or in chair. Reviewed set-up and use of pt's wife's TENS unit as well as provided options for other home TENS units   09/01/22 THERAPEUTIC EXERCISE: to improve flexibility, strength and mobility.  Verbal and tactile cues throughout for technique. Seated hip hinge R figure-4 piriformis stretch x 30" Seated R KTOS  piriformis stretch x 30" Seated hip hinge R HS stretch with strap x 30" Standing lateral lean R ITB stretch with strap x 30", leaning on back of chair for balance Seated lunge position R hip flexor/quad stretch x 30" Bridge + GTB hip ABD isometric 10 x 5" S/L R GTB clam 10 x 3"  MANUAL THERAPY: To promote normalized muscle tension, improved flexibility, increased ROM, and reduced pain. Skilled palpation and monitoring of soft tissue during DN Trigger  Point Dry-Needling  Treatment instructions: Expect mild to moderate muscle soreness. S/S of pneumothorax if dry needled over a lung field, and to seek immediate medical attention should they occur. Patient verbalized understanding of these instructions and education. Patient Consent Given: Yes Education handout provided: Yes Muscles treated: R glute maximus, medius & minimus, Rpiriformis Electrical stimulation performed: No Parameters: N/A Treatment response/outcome: Twitch Response Elicited and Palpable Increase in Muscle Length STM/DTM, manual TPR and pin & stretch to muscles addressed with DN   PATIENT EDUCATION:  Education details: HEP update - seated alternatives for stretches, HEP progression - hip strengthening, role of DN, and DN rational, procedure, outcomes, potential side effects, and recommended post-treatment exercises/activity Person educated: Patient Education method: Explanation, Demonstration, Verbal cues, and Handouts Education comprehension: verbalized understanding, returned demonstration, verbal cues required, and needs further education  HOME EXERCISE PROGRAM: Access Code: [email or web address] URL: [email or web address] Date: 09/11/2022 Prepared by: Harrie Foreman  Exercises - Supine Single Knee to Chest Stretch  - 2-3 x daily - 7 x weekly - 3 reps - 30 sec hold - Supine Figure 4 Piriformis Stretch  - 2-3 x daily - 7 x weekly - 3 reps - 30 sec hold - Supine Piriformis Stretch with Foot on Ground  - 2-3 x daily - 7 x weekly - 3 reps - 30 sec hold - Supine Hamstring Stretch with Strap  - 2-3 x daily - 7 x weekly - 3 reps - 30 sec hold - Supine Iliotibial Band Stretch with Strap  - 2-3 x daily - 7 x weekly - 3 reps - 30 sec hold - Supine Quadriceps Stretch with Strap on Table (Mirrored)  - 2-3 x daily - 7 x weekly - 3 reps - 30 sec hold - Seated Hamstring Stretch  - 2-3 x daily - 7 x weekly - 3 reps - 30 sec hold - Seated Figure 4 Piriformis Stretch (Mirrored)   - 2 x daily - 7 x weekly - 3 reps - 30 sec hold - Seated Piriformis Stretch (Mirrored)  - 2-3 x daily - 7 x weekly - 3 reps - 30 sec hold - Seated Hip Flexor Stretch  - 2-3 x daily - 7 x weekly - 3 reps - 30 sec hold - Standing ITB Stretch  - 2-3 x daily - 7 x weekly - 3 reps - 30 sec hold - Supine Bridge with Resistance Band  - 1 x daily - 7 x weekly - 2 sets - 10 reps - 5 sec hold - Clam with Resistance  - 1 x daily - 7 x weekly - 2 sets - 10 reps - 3-5 sec hold - Isometric Squat with Resistance Loop  - 1 x daily - 7 x weekly - 2 sets - 10 reps - 3-5 sec hold - Forward T  - 1 x daily - 7 x weekly - 3 sets - 10 reps - Single Leg Balance with Four Way Reach and Rotation  - 1 x daily - 7 x weekly - 3 sets -  10 reps  Patient Education - Trigger Point Dry Needling - TENS Therapy   ASSESSMENT:  CLINICAL IMPRESSION: [name] [name] continues to report increased R glute pain with walking, quickly relieved when sitting.  Since he reports more discomfort with hip flexors, reviewed hip flexor stretch in supine, he is extremely tight here and found this position very challenging.  Also noted in prone unable to bend R knee more than 90 deg due to tightness.  Also worked on single leg strengthening exercises for hip intrinsic strengthening and glut strengthening, these he found challenging and felt were targeting problematic muscle, updated HEP.  Noted that tightness in R piriformis/glutes has improved significantly since no palpable trigger points today.  [name] [name] continues to demonstrate potential for improvement and would benefit from continued skilled therapy to address impairments.     OBJECTIVE IMPAIRMENTS: decreased activity tolerance, decreased endurance, decreased mobility, difficulty walking, decreased ROM, decreased strength, hypomobility, increased fascial restrictions, impaired perceived functional ability, increased muscle spasms, impaired flexibility, and pain.   ACTIVITY LIMITATIONS:  squatting, bed mobility, dressing, locomotion level, and caring for others  PARTICIPATION LIMITATIONS: interpersonal relationship, community activity, yard work, and recreational activities  PERSONAL FACTORS: Past/current experiences, Time since onset of injury/illness/exacerbation, and 3+ comorbidities: R hip impingement syndrome, HTN, GERD, cancer - malignant tumor of small bowel  are also affecting patient's functional outcome.   REHAB POTENTIAL: Good  CLINICAL DECISION MAKING: Stable/uncomplicated  EVALUATION COMPLEXITY: Low   GOALS: Goals reviewed with patient? Yes  SHORT TERM GOALS: Target date: 09/19/2022  Patient will be independent with initial HEP. Baseline:  Goal status: IN PROGRESS  09/07/22 - good comfort with stretches, limited performance of strengthening exercises thus far  LONG TERM GOALS: Target date: 10/10/2022  Patient will be independent with advanced/ongoing HEP to improve outcomes and carryover.  Baseline:  Goal status: IN PROGRESS  2.  Patient will report at least 50-75% improvement in R hip pain to improve QOL. Baseline: Pain up to 10/10 Goal status: IN PROGRESS  3.  Patient will demonstrate improved B LE strength to 5/5 for improved stability and ease of mobility. Baseline: Refer to above MMT table Goal status: IN PROGRESS  4.  Patient will be able to ambulate 600' with normal gait pattern without increased pain to access community.  Baseline: Limited walking tolerance due to pain Goal status: IN PROGRESS  5.  Patient will report >/= 78% on LEFS to demonstrate improved functional ability. Baseline: 55 / 80 = 68.8 % Goal status: IN PROGRESS  6.  Patient will report ability to play tennis or pickle ball without limitation due to R hip pain. Baseline:  Goal status: IN PROGRESS  6.  Patient will report ability to mow his lawn without limitation due to R hip pain. Baseline:  Goal status: IN PROGRESS    PLAN:  PT FREQUENCY: 2x/week  PT  DURATION: 4-6 weeks  PLANNED INTERVENTIONS: Therapeutic exercises, Therapeutic activity, Neuromuscular re-education, Balance training, Gait training, Patient/Family education, Self Care, Joint mobilization, Dry Needling, Electrical stimulation, Spinal manipulation, Spinal mobilization, Cryotherapy, Moist heat, Taping, Ultrasound, Ionotophoresis 4mg /ml Dexamethasone, Manual therapy, and Re-evaluation  PLAN FOR NEXT SESSION: progress hip flexibility & strengthening - review/update HEP PRN; MT +/- DN to R glutes, piriformis, hip flexors and/or hamstrings as indicated   Jena Gauss, PT, DPT  09/11/2022, 11:13 AM

## 2022-09-12 ENCOUNTER — Ambulatory Visit (INDEPENDENT_AMBULATORY_CARE_PROVIDER_SITE_OTHER): Payer: Medicare Other | Admitting: Sports Medicine

## 2022-09-12 VITALS — BP 120/84 | HR 68 | Ht 71.0 in | Wt 241.0 lb

## 2022-09-12 DIAGNOSIS — M7601 Gluteal tendinitis, right hip: Secondary | ICD-10-CM | POA: Diagnosis not present

## 2022-09-12 DIAGNOSIS — M25551 Pain in right hip: Secondary | ICD-10-CM

## 2022-09-14 ENCOUNTER — Ambulatory Visit: Payer: Medicare Other | Admitting: Physical Therapy

## 2022-09-14 ENCOUNTER — Encounter: Payer: Self-pay | Admitting: Physical Therapy

## 2022-09-14 DIAGNOSIS — M25551 Pain in right hip: Secondary | ICD-10-CM

## 2022-09-14 DIAGNOSIS — R262 Difficulty in walking, not elsewhere classified: Secondary | ICD-10-CM

## 2022-09-14 DIAGNOSIS — M62838 Other muscle spasm: Secondary | ICD-10-CM

## 2022-09-14 DIAGNOSIS — M6281 Muscle weakness (generalized): Secondary | ICD-10-CM

## 2022-09-14 NOTE — Therapy (Signed)
OUTPATIENT PHYSICAL THERAPY TREATMENT   Patient Name: CREEDON MACTAVISH MRN: [id number] DOB:[birth year], 67 y.o., male Today's Date: 09/14/2022   END OF SESSION:  PT End of Session - 09/14/22 0805     Visit Number 6    Date for PT Re-Evaluation 10/10/22    Authorization Type Medicare & [employer]    Progress Note Due on Visit 10    PT Start Time 0805    PT Stop Time 0845    PT Time Calculation (min) 40 min    Activity Tolerance Patient tolerated treatment well    Behavior During Therapy [facility] for tasks assessed/performed             Past Medical History:  Diagnosis Date   Ankle fracture    Cancer (HCC) 1974   testicular cancer-at age 23   Cataract    bilateral sx   GERD (gastroesophageal reflux disease)    hx of   Hx of colonic polyps    pt unsure,thinks this was done in 1982 in [place]. no reports in EPIC   Hyperlipidemia    on meds   Hypertension    on meds   Kidney stones    Patella fracture    Retinal detachment    OD   Retinal tear of right eye    Syncopal episodes 2008   Past Surgical History:  Procedure Laterality Date   AIR/FLUID EXCHANGE Right 06/16/2014   Procedure: AIR/FLUID EXCHANGE;  Surgeon: Sherrie George, MD;  Location: [facility] OR;  Service: Ophthalmology;  Laterality: Right;   BIOPSY  04/04/2021   Procedure: BIOPSY;  Surgeon: Meridee Score Netty Starring., MD;  Location: [facility] ENDOSCOPY;  Service: Gastroenterology;;   CATARACT EXTRACTION     CATARACT EXTRACTION  09/2014   rt eye//left 01/2015   CATARACT EXTRACTION, BILATERAL  2016   per Dr. Elmer Picker    COLONOSCOPY  02/16/2020   per Dr. Rhea Belton, adenomatous polyps, repeat in 3 yrs   ENTEROSCOPY N/A 04/04/2021   Procedure: ENTEROSCOPY;  Surgeon: Mansouraty, Netty Starring., MD;  Location: [facility] ENDOSCOPY;  Service: Gastroenterology;  Laterality: N/A;   EYE SURGERY     GAS INSERTION Right 06/16/2014   Procedure: INSERTION OF GAS;  Surgeon: Sherrie George, MD;  Location: [facility] OR;  Service: Ophthalmology;  Laterality: Right;  [id number]    GIVENS CAPSULE STUDY N/A 04/02/2021   Procedure: GIVENS CAPSULE STUDY;  Surgeon: Lemar Lofty., MD;  Location: [facility] ENDOSCOPY;  Service: Gastroenterology;  Laterality: N/A;   HYDROCELE EXCISION     IR ANGIOGRAM VISCERAL SELECTIVE  04/04/2021   IR US GUIDE VASC ACCESS LEFT  04/04/2021   KNEE ARTHROSCOPY Right    x 2   LASER PHOTO ABLATION Right 06/16/2014   Procedure: LASER PHOTO ABLATION;  Surgeon: Sherrie George, MD;  Location: [facility] OR;  Service: Ophthalmology;  Laterality: Right;  Headscope laser and endolaser    ORCHIECTOMY     right testicle   REPAIR OF COMPLEX TRACTION RETINAL DETACHMENT Right 06/16/2014   Procedure: REPAIR OF COMPLEX TRACTION RETINAL DETACHMENT;  Surgeon: Sherrie George, MD;  Location: [facility] OR;  Service: Ophthalmology;  Laterality: Right;   RETINAL DETACHMENT SURGERY  06/2014   SUBMUCOSAL TATTOO INJECTION  04/04/2021   Procedure: SUBMUCOSAL TATTOO INJECTION;  Surgeon: Lemar Lofty., MD;  Location: [facility] ENDOSCOPY;  Service: Gastroenterology;;   TONSILLECTOMY     VITRECTOMY Right 06/16/2014   WISDOM TOOTH EXTRACTION     Patient Active Problem List   Diagnosis Date Noted  CKD (chronic kidney disease) stage 3, GFR 30-59 ml/min (HCC) 09/11/2022   GIST (gastrointestinal stromal tumor) of small bowel, malignant (HCC) 07/26/2021   Acute GI bleeding 04/01/2021   Acute blood loss anemia 04/01/2021   Right hip impingement syndrome 08/18/2019   Aortic atherosclerosis (HCC) 07/10/2019   Elevated hemoglobin (HCC) 09/13/2017   Rhegmatogenous retinal detachment of right eye 06/16/2014   Hyperlipidemia 10/01/2007   Essential hypertension 10/01/2007   NEOPLASM, MALIGNANT, TESTES, HX OF 10/01/2007   COLONIC POLYPS, HX OF 10/01/2007   NEPHROLITHIASIS, HX OF 10/01/2007    PCP: [name], MD   REFERRING PROVIDER: Richardean Sale, DO   REFERRING DIAG:  [id number] (ICD-10-CM) - Right hip pain  M76.01 (ICD-10-CM) - Gluteal tendinitis, right hip    THERAPY DIAG:  Pain in right hip  Difficulty in walking, not elsewhere classified  Other muscle spasm  Muscle weakness (generalized)  RATIONALE FOR EVALUATION AND TREATMENT: Rehabilitation  ONSET DATE: chronic x 10 yrs, worsening over past few years  NEXT MD VISIT: 09/12/22   SUBJECTIVE:                                                                                                                                                                                                         SUBJECTIVE STATEMENT: Pt reports he has been doing his elliptical 4x/day at home to help build his wind back up - notes some discomfort in one muscle in his glutes while doing this, but pain not intolerable. MD stopped one of his meds (lisinopril) to see if it will help with the SOB (had to stop while playing tennis on Mon due to being winded). New HEP stretches seem to be helping.  PAIN: Are you having pain? Yes: NPRS scale: 0/10 Pain location: R buttock Pain description: tightness  PERTINENT HISTORY:  R hip impingement syndrome, HTN, GERD, malignant tumor of small bowel  PRECAUTIONS: None  WEIGHT BEARING RESTRICTIONS: No  FALLS:  Has patient fallen in last 6 months? No  LIVING ENVIRONMENT: Lives with: lives with their spouse Lives in: House/apartment Stairs: Yes: Internal: 14 steps; on left going up - bed/bath on main level Has following equipment at home: None  OCCUPATION: Retired  PLOF: Independent and Leisure: Tennis & pickle ball, walking daily (not with current pain)  PATIENT GOALS: "Eliminate the pain."   OBJECTIVE: (objective measures completed at initial evaluation unless otherwise dated)  DIAGNOSTIC FINDINGS:  07/17/22 - Hip & pelvis x-ray: IMPRESSION: Mild left and minimal right femoroacetabular osteoarthritis.  PATIENT SURVEYS:  LEFS 55 / 80 = 68.8 %  COGNITION: Overall cognitive status: Within functional limits for tasks assessed    SENSATION: WFL  MUSCLE  LENGTH: Hamstrings: mod tight R>L ITB: mild tight R>L Piriformis: mod tight B Hip flexors: mod tight B Quads: mod tight B Heelcord: NT  POSTURE:  No Significant postural limitations  PALPATION: Increased muscle tension in R>L glutes and piriformis, hip flexors.  No TTP over R greater trochanter.    LOWER EXTREMITY ROM: Lumbar ROM WNL Mildly limited B hip ROM - limitations most pronounced in hip extension & IR/ER  LOWER EXTREMITY MMT:  MMT Right eval Left eval  Hip flexion 4+ 5  Hip extension 4+ 4+  Hip abduction 4+ 5  Hip adduction 5 5  Hip internal rotation 4+ limited motion 5 limited motion  Hip external rotation 4 4+  Knee flexion 5 5  Knee extension 5 5  Ankle dorsiflexion 4 4+  Ankle plantarflexion 5- 5  Ankle inversion    Ankle eversion     (Blank rows = not tested)    TODAY'S TREATMENT:   09/14/22 THERAPEUTIC EXERCISE: to improve flexibility, strength and mobility.  Verbal and tactile cues throughout for technique. Rec Bike - L4 x 6 min Seated lunge position R hip flexor stretch over edge of chair (demonstrated only) Standing fwd lunge position R hip flexor stretch (demonstrated only) Standing fwd lunge position R hip flexor stretch with L foot elevated on chair 2 x 30" - cues to keep torso upright pushing hips fwd R SLS + GTB B pallof press x 10 each  MANUAL THERAPY: To promote normalized muscle tension, improved flexibility, improved joint mobility, increased ROM, and reduced pain. Skilled palpation and monitoring of soft tissue during DN Trigger Point Dry-Needling  Treatment instructions: Expect mild to moderate muscle soreness. Patient verbalized understanding of these instructions and education. Patient Consent Given: Yes Education handout provided: Previously provided Muscles treated: R lateral glute medius and piriformis, R iliacus Electrical stimulation performed: No Parameters: N/A Treatment response/outcome: Twitch Response Elicited and  Palpable Increase in Muscle Length STM/DTM, manual TPR and pin & stretch to muscles addressed with DN   09/11/22 Therapeutic Exercise: to improve strength and mobility.  Demo, verbal and tactile cues throughout for technique. Bike L4 x 6 min  Hip flexor stretch in supine 5 x 20 sec R - educated on progression, very tight Single leg squat with heel tap - starting on 2" step, with UE support, decreased to floor without support, 2 x 10 bil  4-  way heel tap reaches x 5 each  Single leg RDLs 2 x 10 bil - chair in front for safety, intermittent UE support needed.  Review and progression HEP Manual Therapy: to decrease muscle spasm and pain and improve mobility STM/TPR to R piriformis, glutes.     09/07/22 THERAPEUTIC ACTIVITIES:  Orthostatic BP assessment:  Time BP HR Symptoms  Lying down 5 min 124/72 67 No dizziness/lightheadedness  Sitting 1-2 min 116/70 77 5/10 - dizziness/lightheadedness  Standing 1-2 min 114/68 82 No worsening of dizziness   THERAPEUTIC EXERCISE: to improve flexibility, strength and mobility.  Verbal and tactile cues throughout for technique. Rec Bike - L4 x 6 min Hip flexor stretch  MANUAL THERAPY: To promote normalized muscle tension, improved flexibility, and reduced pain. Skilled palpation and monitoring of soft tissue during DN Trigger Point Dry-Needling  Treatment instructions: Expect mild to moderate muscle soreness. S/S of pneumothorax if dry needled over a lung field, and to seek immediate medical attention should they occur. Patient verbalized  understanding of these instructions and education. Patient Consent Given: Yes Education handout provided: Previously provided Muscles treated: R medial and mid glutes, R lateral piriformis Electrical stimulation performed: No Parameters: N/A Treatment response/outcome: Twitch Response Elicited and Palpable Increase in Muscle Length STM/DTM, manual TPR and pin & stretch to muscles addressed with DN   PATIENT  EDUCATION:  Education details: HEP progression - SLS stability, role of DN, and DN rational, procedure, outcomes, potential side effects, and recommended post-treatment exercises/activity Person educated: Patient Education method: Explanation, Demonstration, and Verbal cues Education comprehension: verbalized understanding, returned demonstration, verbal cues required, and needs further education  HOME EXERCISE PROGRAM: Access Code: [email or web address] URL: [email or web address] Date: 09/14/2022 Prepared by: Glenetta Hew  Exercises - Supine Single Knee to Chest Stretch  - 2-3 x daily - 7 x weekly - 3 reps - 30 sec hold - Supine Figure 4 Piriformis Stretch  - 2-3 x daily - 7 x weekly - 3 reps - 30 sec hold - Supine Piriformis Stretch with Foot on Ground  - 2-3 x daily - 7 x weekly - 3 reps - 30 sec hold - Supine Hamstring Stretch with Strap  - 2-3 x daily - 7 x weekly - 3 reps - 30 sec hold - Supine Iliotibial Band Stretch with Strap  - 2-3 x daily - 7 x weekly - 3 reps - 30 sec hold - Supine Quadriceps Stretch with Strap on Table (Mirrored)  - 2-3 x daily - 7 x weekly - 3 reps - 30 sec hold - Seated Hamstring Stretch  - 2-3 x daily - 7 x weekly - 3 reps - 30 sec hold - Seated Figure 4 Piriformis Stretch (Mirrored)  - 2 x daily - 7 x weekly - 3 reps - 30 sec hold - Seated Piriformis Stretch (Mirrored)  - 2-3 x daily - 7 x weekly - 3 reps - 30 sec hold - Seated Hip Flexor Stretch  - 2-3 x daily - 7 x weekly - 3 reps - 30 sec hold - Standing ITB Stretch  - 2-3 x daily - 7 x weekly - 3 reps - 30 sec hold - Supine Bridge with Resistance Band  - 1 x daily - 7 x weekly - 2 sets - 10 reps - 5 sec hold - Clam with Resistance  - 1 x daily - 7 x weekly - 2 sets - 10 reps - 3-5 sec hold - Isometric Squat with Resistance Loop  - 1 x daily - 7 x weekly - 2 sets - 10 reps - 3-5 sec hold - Forward T  - 1 x daily - 7 x weekly - 3 sets - 10 reps - Single Leg Balance with Four Way Reach and Rotation  - 1  x daily - 7 x weekly - 3 sets - 10 reps - Single-Leg Anti-Rotation Press With Anchored Resistance  - 1 x daily - 7 x weekly - 2 sets - 10 reps - 3 sec hold  Patient Education - Trigger Point Dry Needling - TENS Therapy   ASSESSMENT:  CLINICAL IMPRESSION: Eunice Seim" reports new stretches and exercises from last visit continue to be helping target his current issues, however he still notes some localized pain in R buttocks when performing elliptical at home. Performed MT incorporating DN to address ongoing abnormal muscle tension in R lateral glutes/piriformis as well as R hip flexors (iliacus) with good twitch responses elicited resulting in palpable reduction in muscle tension. He notes good benefit from mod thomas hip  flexor stretch which he performs from the edge of the sofa while watching TV, but also demonstrated seated and standing alternatives for hip flexor stretches that he can perform as part of his warm up for playing tennis. Continued progression of SLS strengthening with resisted rotational stability, adding SLS pallof press to HEP. [name] would like to take some time next visit to review, update and potentially consolidate his HEP  OBJECTIVE IMPAIRMENTS: decreased activity tolerance, decreased endurance, decreased mobility, difficulty walking, decreased ROM, decreased strength, hypomobility, increased fascial restrictions, impaired perceived functional ability, increased muscle spasms, impaired flexibility, and pain.   ACTIVITY LIMITATIONS: squatting, bed mobility, dressing, locomotion level, and caring for others  PARTICIPATION LIMITATIONS: interpersonal relationship, community activity, yard work, and recreational activities  PERSONAL FACTORS: Past/current experiences, Time since onset of injury/illness/exacerbation, and 3+ comorbidities: R hip impingement syndrome, HTN, GERD, cancer - malignant tumor of small bowel  are also affecting patient's functional outcome.   REHAB  POTENTIAL: Good  CLINICAL DECISION MAKING: Stable/uncomplicated  EVALUATION COMPLEXITY: Low   GOALS: Goals reviewed with patient? Yes  SHORT TERM GOALS: Target date: 09/19/2022  Patient will be independent with initial HEP. Baseline:  Goal status: MET  09/14/22  LONG TERM GOALS: Target date: 10/10/2022  Patient will be independent with advanced/ongoing HEP to improve outcomes and carryover.  Baseline:  Goal status: IN PROGRESS  2.  Patient will report at least 50-75% improvement in R hip pain to improve QOL. Baseline: Pain up to 10/10 Goal status: IN PROGRESS  3.  Patient will demonstrate improved B LE strength to 5/5 for improved stability and ease of mobility. Baseline: Refer to above MMT table Goal status: IN PROGRESS  4.  Patient will be able to ambulate 600' with normal gait pattern without increased pain to access community.  Baseline: Limited walking tolerance due to pain Goal status: IN PROGRESS  5.  Patient will report >/= 78% on LEFS to demonstrate improved functional ability. Baseline: 55 / 80 = 68.8 % Goal status: IN PROGRESS  6.  Patient will report ability to play tennis or pickle ball without limitation due to R hip pain. Baseline:  Goal status: IN PROGRESS  6.  Patient will report ability to mow his lawn without limitation due to R hip pain. Baseline:  Goal status: IN PROGRESS    PLAN:  PT FREQUENCY: 2x/week  PT DURATION: 4-6 weeks  PLANNED INTERVENTIONS: Therapeutic exercises, Therapeutic activity, Neuromuscular re-education, Balance training, Gait training, Patient/Family education, Self Care, Joint mobilization, Dry Needling, Electrical stimulation, Spinal manipulation, Spinal mobilization, Cryotherapy, Moist heat, Taping, Ultrasound, Ionotophoresis 4mg /ml Dexamethasone, Manual therapy, and Re-evaluation  PLAN FOR NEXT SESSION: review/update and consolidate HEP as appropriate; initiate LTG assessment as relevant; progress hip flexibility &  strengthening - review/update HEP PRN; MT +/- DN to R glutes, piriformis, hip flexors and/or hamstrings as indicated   Marry Guan, PT 09/14/2022, 10:20 AM

## 2022-09-18 ENCOUNTER — Ambulatory Visit: Payer: Medicare Other

## 2022-09-18 DIAGNOSIS — M6281 Muscle weakness (generalized): Secondary | ICD-10-CM

## 2022-09-18 DIAGNOSIS — M25551 Pain in right hip: Secondary | ICD-10-CM | POA: Diagnosis not present

## 2022-09-18 DIAGNOSIS — R262 Difficulty in walking, not elsewhere classified: Secondary | ICD-10-CM

## 2022-09-18 DIAGNOSIS — M62838 Other muscle spasm: Secondary | ICD-10-CM

## 2022-09-18 NOTE — Therapy (Signed)
OUTPATIENT PHYSICAL THERAPY TREATMENT   Patient Name: [name] [name] MRN: [id number] DOB:November 14, 1955, 67 y.o., male Today's Date: 09/18/2022   END OF SESSION:  PT End of Session - 09/18/22 0808     Visit Number 7    Date for PT Re-Evaluation 10/10/22    Authorization Type Medicare & [employer]    Progress Note Due on Visit 10    PT Start Time 0802    PT Stop Time 0844    PT Time Calculation (min) 42 min    Activity Tolerance Patient tolerated treatment well    Behavior During Therapy [facility] for tasks assessed/performed              Past Medical History:  Diagnosis Date   Ankle fracture    Cancer (HCC) 1974   testicular cancer-at age 61   Cataract    bilateral sx   GERD (gastroesophageal reflux disease)    hx of   Hx of colonic polyps    pt unsure,thinks this was done in 1982 in [place]. no reports in EPIC   Hyperlipidemia    on meds   Hypertension    on meds   Kidney stones    Patella fracture    Retinal detachment    OD   Retinal tear of right eye    Syncopal episodes 2008   Past Surgical History:  Procedure Laterality Date   AIR/FLUID EXCHANGE Right 06/16/2014   Procedure: AIR/FLUID EXCHANGE;  Surgeon: Sherrie George, MD;  Location: [facility] OR;  Service: Ophthalmology;  Laterality: Right;   BIOPSY  04/04/2021   Procedure: BIOPSY;  Surgeon: Meridee Score Netty Starring., MD;  Location: [facility] ENDOSCOPY;  Service: Gastroenterology;;   CATARACT EXTRACTION     CATARACT EXTRACTION  09/2014   rt eye//left 01/2015   CATARACT EXTRACTION, BILATERAL  2016   per Dr. Elmer Picker    COLONOSCOPY  02/16/2020   per Dr. Rhea Belton, adenomatous polyps, repeat in 3 yrs   ENTEROSCOPY N/A 04/04/2021   Procedure: ENTEROSCOPY;  Surgeon: Mansouraty, Netty Starring., MD;  Location: [facility] ENDOSCOPY;  Service: Gastroenterology;  Laterality: N/A;   EYE SURGERY     GAS INSERTION Right 06/16/2014   Procedure: INSERTION OF GAS;  Surgeon: Sherrie George, MD;  Location: [facility] OR;  Service: Ophthalmology;  Laterality: Right;   [id number]   GIVENS CAPSULE STUDY N/A 04/02/2021   Procedure: GIVENS CAPSULE STUDY;  Surgeon: Lemar Lofty., MD;  Location: [facility] ENDOSCOPY;  Service: Gastroenterology;  Laterality: N/A;   HYDROCELE EXCISION     IR ANGIOGRAM VISCERAL SELECTIVE  04/04/2021   IR US GUIDE VASC ACCESS LEFT  04/04/2021   KNEE ARTHROSCOPY Right    x 2   LASER PHOTO ABLATION Right 06/16/2014   Procedure: LASER PHOTO ABLATION;  Surgeon: Sherrie George, MD;  Location: [facility] OR;  Service: Ophthalmology;  Laterality: Right;  Headscope laser and endolaser    ORCHIECTOMY     right testicle   REPAIR OF COMPLEX TRACTION RETINAL DETACHMENT Right 06/16/2014   Procedure: REPAIR OF COMPLEX TRACTION RETINAL DETACHMENT;  Surgeon: Sherrie George, MD;  Location: [facility] OR;  Service: Ophthalmology;  Laterality: Right;   RETINAL DETACHMENT SURGERY  06/2014   SUBMUCOSAL TATTOO INJECTION  04/04/2021   Procedure: SUBMUCOSAL TATTOO INJECTION;  Surgeon: Lemar Lofty., MD;  Location[facility] ENDOSCOPY;  Service: Gastroenterology;;   TONSILLECTOMY     VITRECTOMY Right 06/16/2014   WISDOM TOOTH EXTRACTION     Patient Active Problem List   Diagnosis Date Noted  CKD (chronic kidney disease) stage 3, GFR 30-59 ml/min (HCC) 09/11/2022   GIST (gastrointestinal stromal tumor) of small bowel, malignant (HCC) 07/26/2021   Acute GI bleeding 04/01/2021   Acute blood loss anemia 04/01/2021   Right hip impingement syndrome 08/18/2019   Aortic atherosclerosis (HCC) 07/10/2019   Elevated hemoglobin (HCC) 09/13/2017   Rhegmatogenous retinal detachment of right eye 06/16/2014   Hyperlipidemia 10/01/2007   Essential hypertension 10/01/2007   NEOPLASM, MALIGNANT, TESTES, HX OF 10/01/2007   COLONIC POLYPS, HX OF 10/01/2007   NEPHROLITHIASIS, HX OF 10/01/2007    PCP: [name], MD   REFERRING PROVIDER: Richardean Sale, DO   REFERRING DIAG:  [phone] (ICD-10-CM) - Right hip pain  M76.01 (ICD-10-CM) - Gluteal tendinitis, right hip    THERAPY DIAG:  Pain in right hip  Difficulty in walking, not elsewhere classified  Other muscle spasm  Muscle weakness (generalized)  RATIONALE FOR EVALUATION AND TREATMENT: Rehabilitation  ONSET DATE: chronic x 10 yrs, worsening over past few years  NEXT MD VISIT: 09/12/22   SUBJECTIVE:                                                                                                                                                                                                         SUBJECTIVE STATEMENT: Pt notes improvement in his R hip, he now feels that his endurance is better now that his hip feels better.  PAIN: Are you having pain? Yes: NPRS scale: 0/10 Pain location: R buttock Pain description: tightness  PERTINENT HISTORY:  R hip impingement syndrome, HTN, GERD, malignant tumor of small bowel  PRECAUTIONS: None  WEIGHT BEARING RESTRICTIONS: No  FALLS:  Has patient fallen in last 6 months? No  LIVING ENVIRONMENT: Lives with: lives with their spouse Lives in: House/apartment Stairs: Yes: Internal: 14 steps; on left going up - bed/bath on main level Has following equipment at home: None  OCCUPATION: Retired  PLOF: Independent and Leisure: Tennis & pickle ball, walking daily (not with current pain)  PATIENT GOALS: "Eliminate the pain."   OBJECTIVE: (objective measures completed at initial evaluation unless otherwise dated)  DIAGNOSTIC FINDINGS:  07/17/22 - Hip & pelvis x-ray: IMPRESSION: Mild left and minimal right femoroacetabular osteoarthritis.  PATIENT SURVEYS:  LEFS 55 / 80 = 68.8 %  COGNITION: Overall cognitive status: Within functional limits for tasks assessed    SENSATION: WFL  MUSCLE LENGTH: Hamstrings: mod tight R>L ITB: mild tight R>L Piriformis: mod tight B Hip flexors: mod tight B Quads: mod tight B Heelcord: NT  POSTURE:  No Significant postural limitations  PALPATION: Increased muscle tension in R>L glutes and  piriformis, hip flexors.  No TTP over R greater trochanter.    LOWER EXTREMITY ROM: Lumbar ROM WNL Mildly limited B hip ROM - limitations most pronounced in hip extension & IR/ER  LOWER EXTREMITY MMT:  MMT Right eval Left eval  Hip flexion 4+ 5  Hip extension 4+ 4+  Hip abduction 4+ 5  Hip adduction 5 5  Hip internal rotation 4+ limited motion 5 limited motion  Hip external rotation 4 4+  Knee flexion 5 5  Knee extension 5 5  Ankle dorsiflexion 4 4+  Ankle plantarflexion 5- 5  Ankle inversion    Ankle eversion     (Blank rows = not tested)    TODAY'S TREATMENT:  09/18/22 THERAPEUTIC EXERCISE: to improve flexibility, strength and mobility.  Verbal and tactile cues throughout for technique. Rec Bike - L7 x 6 min Pallof press standing w/ GTB x 10 both sides Standing four way balance/reach R/L 4x Forward T x 10 with counter support Seated piriformis stretch x 30 sec hold  Seated hamstring stretch x 30 sec hold Supine hamstring stretch w/ strap x 30 sec hold Supine quad stretch w/ strap x 30 sec hold   09/14/22 THERAPEUTIC EXERCISE: to improve flexibility, strength and mobility.  Verbal and tactile cues throughout for technique. Rec Bike - L4 x 6 min Seated lunge position R hip flexor stretch over edge of chair (demonstrated only) Standing fwd lunge position R hip flexor stretch (demonstrated only) Standing fwd lunge position R hip flexor stretch with L foot elevated on chair 2 x 30" - cues to keep torso upright pushing hips fwd R SLS + GTB B pallof press x 10 each  MANUAL THERAPY: To promote normalized muscle tension, improved flexibility, improved joint mobility, increased ROM, and reduced pain. Skilled palpation and monitoring of soft tissue during DN Trigger Point Dry-Needling  Treatment instructions: Expect mild to moderate muscle soreness. Patient verbalized understanding of these instructions and education. Patient Consent Given: Yes Education handout provided:  Previously provided Muscles treated: R lateral glute medius and piriformis, R iliacus Electrical stimulation performed: No Parameters: N/A Treatment response/outcome: Twitch Response Elicited and Palpable Increase in Muscle Length STM/DTM, manual TPR and pin & stretch to muscles addressed with DN   09/11/22 Therapeutic Exercise: to improve strength and mobility.  Demo, verbal and tactile cues throughout for technique. Bike L4 x 6 min  Hip flexor stretch in supine 5 x 20 sec R - educated on progression, very tight Single leg squat with heel tap - starting on 2" step, with UE support, decreased to floor without support, 2 x 10 bil  4-  way heel tap reaches x 5 each  Single leg RDLs 2 x 10 bil - chair in front for safety, intermittent UE support needed.  Review and progression HEP Manual Therapy: to decrease muscle spasm and pain and improve mobility STM/TPR to R piriformis, glutes.     09/07/22 THERAPEUTIC ACTIVITIES:  Orthostatic BP assessment:  Time BP HR Symptoms  Lying down 5 min 124/72 67 No dizziness/lightheadedness  Sitting 1-2 min 116/70 77 5/10 - dizziness/lightheadedness  Standing 1-2 min 114/68 82 No worsening of dizziness   THERAPEUTIC EXERCISE: to improve flexibility, strength and mobility.  Verbal and tactile cues throughout for technique. Rec Bike - L4 x 6 min Hip flexor stretch  MANUAL THERAPY: To promote normalized muscle tension, improved flexibility, and reduced pain. Skilled palpation and monitoring of soft tissue during DN Trigger Point  Dry-Needling  Treatment instructions: Expect mild to moderate muscle soreness. S/S of pneumothorax if dry needled over a lung field, and to seek immediate medical attention should they occur. Patient verbalized understanding of these instructions and education. Patient Consent Given: Yes Education handout provided: Previously provided Muscles treated: R medial and mid glutes, R lateral piriformis Electrical stimulation performed:  No Parameters: N/A Treatment response/outcome: Twitch Response Elicited and Palpable Increase in Muscle Length STM/DTM, manual TPR and pin & stretch to muscles addressed with DN   PATIENT EDUCATION:  Education details: HEP progression - SLS stability, role of DN, and DN rational, procedure, outcomes, potential side effects, and recommended post-treatment exercises/activity Person educated: Patient Education method: Explanation, Demonstration, and Verbal cues Education comprehension: verbalized understanding, returned demonstration, verbal cues required, and needs further education  HOME EXERCISE PROGRAM: Access Code: [email or web address] URL: [email or web address] Date: 09/18/2022 Prepared by: Verta Ellen  Exercises - Supine Single Knee to Chest Stretch  - 2-3 x daily - 7 x weekly - 3 reps - 30 sec hold - Supine Figure 4 Piriformis Stretch  - 2-3 x daily - 7 x weekly - 3 reps - 30 sec hold - Supine Piriformis Stretch with Foot on Ground  - 2-3 x daily - 7 x weekly - 3 reps - 30 sec hold - Supine Hamstring Stretch with Strap  - 2-3 x daily - 7 x weekly - 3 reps - 30 sec hold - Supine Quadriceps Stretch with Strap on Table (Mirrored)  - 2-3 x daily - 7 x weekly - 3 reps - 30 sec hold - Seated Hamstring Stretch  - 2-3 x daily - 7 x weekly - 3 reps - 30 sec hold - Seated Figure 4 Piriformis Stretch (Mirrored)  - 2 x daily - 7 x weekly - 3 reps - 30 sec hold - Supine Bridge with Resistance Band  - 1 x daily - 7 x weekly - 2 sets - 10 reps - 5 sec hold - Clam with Resistance  - 1 x daily - 7 x weekly - 2 sets - 10 reps - 3-5 sec hold - Forward T  - 1 x daily - 7 x weekly - 3 sets - 10 reps - Single Leg Balance with Four Way Reach and Rotation  - 1 x daily - 7 x weekly - 3 sets - 10 reps - Single-Leg Anti-Rotation Press With Anchored Resistance  - 1 x daily - 7 x weekly - 2 sets - 10 reps - 3 sec hold  Patient Education - Trigger Point Dry Needling - TENS  Therapy   ASSESSMENT:  CLINICAL IMPRESSION: Reviewed current HEP with patient to ensure understanding. Patient wanted to condense his HEP to focus on the most important movements. Instructions and cues were provided with HEP exercises to correct his form as needed. Most cues were needed with the pallof press and balance 4 way reach.  OBJECTIVE IMPAIRMENTS: decreased activity tolerance, decreased endurance, decreased mobility, difficulty walking, decreased ROM, decreased strength, hypomobility, increased fascial restrictions, impaired perceived functional ability, increased muscle spasms, impaired flexibility, and pain.   ACTIVITY LIMITATIONS: squatting, bed mobility, dressing, locomotion level, and caring for others  PARTICIPATION LIMITATIONS: interpersonal relationship, community activity, yard work, and recreational activities  PERSONAL FACTORS: Past/current experiences, Time since onset of injury/illness/exacerbation, and 3+ comorbidities: R hip impingement syndrome, HTN, GERD, cancer - malignant tumor of small bowel  are also affecting patient's functional outcome.   REHAB POTENTIAL: Good  CLINICAL DECISION MAKING: Stable/uncomplicated  EVALUATION COMPLEXITY: Low  GOALS: Goals reviewed with patient? Yes  SHORT TERM GOALS: Target date: 09/19/2022  Patient will be independent with initial HEP. Baseline:  Goal status: MET  09/14/22  LONG TERM GOALS: Target date: 10/10/2022  Patient will be independent with advanced/ongoing HEP to improve outcomes and carryover.  Baseline:  Goal status: IN PROGRESS  2.  Patient will report at least 50-75% improvement in R hip pain to improve QOL. Baseline: Pain up to 10/10 Goal status: IN PROGRESS  3.  Patient will demonstrate improved B LE strength to 5/5 for improved stability and ease of mobility. Baseline: Refer to above MMT table Goal status: IN PROGRESS  4.  Patient will be able to ambulate 600' with normal gait pattern without  increased pain to access community.  Baseline: Limited walking tolerance due to pain Goal status: IN PROGRESS  5.  Patient will report >/= 78% on LEFS to demonstrate improved functional ability. Baseline: 55 / 80 = 68.8 % Goal status: IN PROGRESS  6.  Patient will report ability to play tennis or pickle ball without limitation due to R hip pain. Baseline:  Goal status: IN PROGRESS  6.  Patient will report ability to mow his lawn without limitation due to R hip pain. Baseline:  Goal status: IN PROGRESS    PLAN:  PT FREQUENCY: 2x/week  PT DURATION: 4-6 weeks  PLANNED INTERVENTIONS: Therapeutic exercises, Therapeutic activity, Neuromuscular re-education, Balance training, Gait training, Patient/Family education, Self Care, Joint mobilization, Dry Needling, Electrical stimulation, Spinal manipulation, Spinal mobilization, Cryotherapy, Moist heat, Taping, Ultrasound, Ionotophoresis 4mg /ml Dexamethasone, Manual therapy, and Re-evaluation  PLAN FOR NEXT SESSION: review/update and consolidate HEP as appropriate; initiate LTG assessment as relevant; progress hip flexibility & strengthening - review/update HEP PRN; MT +/- DN to R glutes, piriformis, hip flexors and/or hamstrings as indicated   Billi Bright L Kaina Orengo, PTA 09/18/2022, 8:51 AM

## 2022-09-22 ENCOUNTER — Ambulatory Visit: Payer: Medicare Other | Admitting: Physical Therapy

## 2022-09-26 ENCOUNTER — Encounter: Payer: Self-pay | Admitting: Physical Therapy

## 2022-09-26 ENCOUNTER — Ambulatory Visit: Payer: Medicare Other | Admitting: Physical Therapy

## 2022-09-26 DIAGNOSIS — M25551 Pain in right hip: Secondary | ICD-10-CM | POA: Diagnosis not present

## 2022-09-26 DIAGNOSIS — R262 Difficulty in walking, not elsewhere classified: Secondary | ICD-10-CM

## 2022-09-26 DIAGNOSIS — M62838 Other muscle spasm: Secondary | ICD-10-CM

## 2022-09-26 DIAGNOSIS — M6281 Muscle weakness (generalized): Secondary | ICD-10-CM

## 2022-09-26 NOTE — Therapy (Addendum)
OUTPATIENT PHYSICAL THERAPY TREATMENT/Discharge  Progress Note  Reporting Period 08/29/2022 to 09/26/2022  See note below for Objective Data and Assessment of Progress/Goals.    Patient Name: [name] [name] MRN: [id number] DOB:01-30-1956, 67 y.o., male Today's Date: 09/26/2022   END OF SESSION:  PT End of Session - 09/26/22 0802     Visit Number 8    Date for PT Re-Evaluation 10/10/22    Authorization Type Medicare & [employer]    Progress Note Due on Visit 10    PT Start Time 0802    PT Stop Time 0837    PT Time Calculation (min) 35 min    Activity Tolerance Patient tolerated treatment well    Behavior During Therapy [facility] for tasks assessed/performed               Past Medical History:  Diagnosis Date   Ankle fracture    Cancer (HCC) 1974   testicular cancer-at age 68   Cataract    bilateral sx   GERD (gastroesophageal reflux disease)    hx of   Hx of colonic polyps    pt unsure,thinks this was done in 1982 in [place]. no reports in EPIC   Hyperlipidemia    on meds   Hypertension    on meds   Kidney stones    Patella fracture    Retinal detachment    OD   Retinal tear of right eye    Syncopal episodes 2008   Past Surgical History:  Procedure Laterality Date   AIR/FLUID EXCHANGE Right 06/16/2014   Procedure: AIR/FLUID EXCHANGE;  Surgeon: Sherrie George, MD;  Location: [facility] OR;  Service: Ophthalmology;  Laterality: Right;   BIOPSY  04/04/2021   Procedure: BIOPSY;  Surgeon: Meridee Score Netty Starring., MD;  Location: [facility] ENDOSCOPY;  Service: Gastroenterology;;   CATARACT EXTRACTION     CATARACT EXTRACTION  09/2014   rt eye//left 01/2015   CATARACT EXTRACTION, BILATERAL  2016   per Dr. Elmer Picker    COLONOSCOPY  02/16/2020   per Dr. Rhea Belton, adenomatous polyps, repeat in 3 yrs   ENTEROSCOPY N/A 04/04/2021   Procedure: ENTEROSCOPY;  Surgeon: Mansouraty, Netty Starring., MD;  Location: [facility] ENDOSCOPY;  Service: Gastroenterology;  Laterality: N/A;   EYE SURGERY     GAS INSERTION  Right 06/16/2014   Procedure: INSERTION OF GAS;  Surgeon: Sherrie George, MD;  Location: [facility] OR;  Service: Ophthalmology;  Laterality: Right;  [id number]   GIVENS CAPSULE STUDY N/A 04/02/2021   Procedure: GIVENS CAPSULE STUDY;  Surgeon: Lemar Lofty., MD;  Location: [facility] ENDOSCOPY;  Service: Gastroenterology;  Laterality: N/A;   HYDROCELE EXCISION     IR ANGIOGRAM VISCERAL SELECTIVE  04/04/2021   IR US GUIDE VASC ACCESS LEFT  04/04/2021   KNEE ARTHROSCOPY Right    x 2   LASER PHOTO ABLATION Right 06/16/2014   Procedure: LASER PHOTO ABLATION;  Surgeon: Sherrie George, MD;  Location: [facility] OR;  Service: Ophthalmology;  Laterality: Right;  Headscope laser and endolaser    ORCHIECTOMY     right testicle   REPAIR OF COMPLEX TRACTION RETINAL DETACHMENT Right 06/16/2014   Procedure: REPAIR OF COMPLEX TRACTION RETINAL DETACHMENT;  Surgeon: Sherrie George, MD;  Location: [facility] OR;  Service: Ophthalmology;  Laterality: Right;   RETINAL DETACHMENT SURGERY  06/2014   SUBMUCOSAL TATTOO INJECTION  04/04/2021   Procedure: SUBMUCOSAL TATTOO INJECTION;  Surgeon: Lemar Lofty., MD;  Location: [facility] ENDOSCOPY;  Service: Gastroenterology;;   TONSILLECTOMY  VITRECTOMY Right 06/16/2014   WISDOM TOOTH EXTRACTION     Patient Active Problem List   Diagnosis Date Noted   CKD (chronic kidney disease) stage 3, GFR 30-59 ml/min (HCC) 09/11/2022   GIST (gastrointestinal stromal tumor) of small bowel, malignant (HCC) 07/26/2021   Acute GI bleeding 04/01/2021   Acute blood loss anemia 04/01/2021   Right hip impingement syndrome 08/18/2019   Aortic atherosclerosis (HCC) 07/10/2019   Elevated hemoglobin (HCC) 09/13/2017   Rhegmatogenous retinal detachment of right eye 06/16/2014   Hyperlipidemia 10/01/2007   Essential hypertension 10/01/2007   NEOPLASM, MALIGNANT, TESTES, HX OF 10/01/2007   COLONIC POLYPS, HX OF 10/01/2007   NEPHROLITHIASIS, HX OF 10/01/2007    PCP: [name], MD   REFERRING  PROVIDER: Richardean Sale, DO   REFERRING DIAG:  [phone] (ICD-10-CM) - Right hip pain  M76.01 (ICD-10-CM) - Gluteal tendinitis, right hip   THERAPY DIAG:  Pain in right hip  Difficulty in walking, not elsewhere classified  Other muscle spasm  Muscle weakness (generalized)  RATIONALE FOR EVALUATION AND TREATMENT: Rehabilitation  ONSET DATE: chronic x 10 yrs, worsening over past few years  NEXT MD VISIT: PRN   SUBJECTIVE:                                                                                                                                                                                                         SUBJECTIVE STATEMENT: Pt reports he seems to be managing the pain better with the stretches. Also notes he is not winded when he plays tennis now, which also seems to be helping.  PAIN: Are you having pain? Yes: NPRS scale: 1-2/10 Pain location: R buttock Pain description: tightness  PERTINENT HISTORY:  R hip impingement syndrome, HTN, GERD, malignant tumor of small bowel  PRECAUTIONS: None  WEIGHT BEARING RESTRICTIONS: No  FALLS:  Has patient fallen in last 6 months? No  LIVING ENVIRONMENT: Lives with: lives with their spouse Lives in: House/apartment Stairs: Yes: Internal: 14 steps; on left going up - bed/bath on main level Has following equipment at home: None  OCCUPATION: Retired  PLOF: Independent and Leisure: Tennis & pickle ball, walking daily (not with current pain)  PATIENT GOALS: "Eliminate the pain."   OBJECTIVE: (objective measures completed at initial evaluation unless otherwise dated)  DIAGNOSTIC FINDINGS:  07/17/22 - Hip & pelvis x-ray: IMPRESSION: Mild left and minimal right femoroacetabular osteoarthritis.  PATIENT SURVEYS:  LEFS 55 / 80 = 68.8 %  COGNITION: Overall cognitive status: Within functional limits for tasks assessed    SENSATION: [facility]  MUSCLE LENGTH: Hamstrings: mod tight R>L ITB: mild tight  R>L Piriformis: mod tight B Hip flexors: mod tight B Quads: mod tight B Heelcord: NT  POSTURE:  No Significant postural limitations  PALPATION: Increased muscle tension in R>L glutes and piriformis, hip flexors.  No TTP over R greater trochanter.    LOWER EXTREMITY ROM: Lumbar ROM WNL Mildly limited B hip ROM - limitations most pronounced in hip extension & IR/ER  LOWER EXTREMITY MMT:  MMT Right eval Left eval Right 09/26/22 Left 09/26/22  Hip flexion 4+ 5 5 5   Hip extension 4+ 4+ 5 5  Hip abduction 4+ 5 4+ 5  Hip adduction 5 5 5 5   Hip internal rotation 4+ limited motion 5 limited motion 5 5  Hip external rotation 4 4+ 4+ 5  Knee flexion 5 5 5 5   Knee extension 5 5 5 5   Ankle dorsiflexion 4 4+ 4+ 5  Ankle plantarflexion 5- 5    Ankle inversion      Ankle eversion       (Blank rows = not tested)    TODAY'S TREATMENT:   09/26/22 THERAPEUTIC EXERCISE: to improve flexibility, strength and mobility.  Verbal and tactile cues throughout for technique. Rec Bike - L7 x 6 min R/L side plank + GTB clam 2 x 10 Bridge + GTB clam x 10, blue TB clam  x 10 Review of recommended frequency and duration of ongoing HEP  THERAPEUTIC ACTIVITIES: LE MMT LEFS = 74 / 80 = 92.5 % Goal assessment   09/18/22 THERAPEUTIC EXERCISE: to improve flexibility, strength and mobility.  Verbal and tactile cues throughout for technique. Rec Bike - L7 x 6 min Pallof press standing w/ GTB x 10 both sides Standing four way balance/reach R/L 4x Forward T x 10 with counter support Seated piriformis stretch x 30 sec hold  Seated hamstring stretch x 30 sec hold Supine hamstring stretch w/ strap x 30 sec hold Supine quad stretch w/ strap x 30 sec hold   09/14/22 THERAPEUTIC EXERCISE: to improve flexibility, strength and mobility.  Verbal and tactile cues throughout for technique. Rec Bike - L4 x 6 min Seated lunge position R hip flexor stretch over edge of chair (demonstrated only) Standing fwd lunge  position R hip flexor stretch (demonstrated only) Standing fwd lunge position R hip flexor stretch with L foot elevated on chair 2 x 30" - cues to keep torso upright pushing hips fwd R SLS + GTB B pallof press x 10 each  MANUAL THERAPY: To promote normalized muscle tension, improved flexibility, improved joint mobility, increased ROM, and reduced pain. Skilled palpation and monitoring of soft tissue during DN Trigger Point Dry-Needling  Treatment instructions: Expect mild to moderate muscle soreness. Patient verbalized understanding of these instructions and education. Patient Consent Given: Yes Education handout provided: Previously provided Muscles treated: R lateral glute medius and piriformis, R iliacus Electrical stimulation performed: No Parameters: N/A Treatment response/outcome: Twitch Response Elicited and Palpable Increase in Muscle Length STM/DTM, manual TPR and pin & stretch to muscles addressed with DN   PATIENT EDUCATION:  Education details: HEP review, HEP progression - progression of clam and bridges, and recommended frequency for ongoing HEP at discharge to prevent loss of gains achieved with PT Person educated: Patient Education method: Explanation, Demonstration, Verbal cues, and Handouts Education comprehension: verbalized understanding and returned demonstration  HOME EXERCISE PROGRAM: Access Code: [email or web address] URL: [email or web address] Date: 09/26/2022 Prepared by: Glenetta Hew  Exercises - Supine Single Knee to Chest Stretch  -  2-3 x daily - 7 x weekly - 3 reps - 30 sec hold - Supine Figure 4 Piriformis Stretch  - 2-3 x daily - 7 x weekly - 3 reps - 30 sec hold - Supine Piriformis Stretch with Foot on Ground  - 2-3 x daily - 7 x weekly - 3 reps - 30 sec hold - Supine Hamstring Stretch with Strap  - 2-3 x daily - 7 x weekly - 3 reps - 30 sec hold - Supine Quadriceps Stretch with Strap on Table (Mirrored)  - 2-3 x daily - 7 x weekly - 3 reps - 30 sec  hold - Seated Hamstring Stretch  - 2-3 x daily - 7 x weekly - 3 reps - 30 sec hold - Seated Figure 4 Piriformis Stretch (Mirrored)  - 2 x daily - 7 x weekly - 3 reps - 30 sec hold - Forward T  - 1 x daily - 3-4 x weekly - 3 sets - 10 reps - Single Leg Balance with Four Way Reach and Rotation  - 1 x daily - 3-4 x weekly - 3 sets - 10 reps - Single-Leg Anti-Rotation Press With Anchored Resistance  - 1 x daily - 3-4 x weekly - 2 sets - 10 reps - 3 sec hold - Bridge with Hip Abduction and Resistance  - 1 x daily - 3-4 x weekly - 2 sets - 10 reps - 5 sec hold - Side Plank with Clam and Resistance  - 1 x daily - 3-4 x weekly - 2 sets - 10 reps - 3 sec hold  Patient Education - Trigger Point Dry Needling - TENS Therapy   ASSESSMENT:  CLINICAL IMPRESSION: [name] [name]" reports he feels much better in his ability to manage his pain with the stretches and exercises, noting at least 75% improvement in pain since start of PT and 100% improvement mentally. He has been able to resume mowing the lawn w/o issues and playing tennis managing the pain with stretches during breaks in the game. His overall LE strength is now grossly 5/5 with only exceptions being R hip ABD and ER as well as R DF 4+/5. HEP exercises progressed to target remaining strength deficits and pt reporting good comfort with remaining HEP, denying need for further review. We discussed recommended frequency and duration for ongoing performance of HEP with pt acknowledging understanding. All PT goals now met with exception of hip strength and [name] [name] feels ready to transition to his HEP but would like to remain on hold for 30-days in the event that issues arise that would necessitate a return to PT. Pt requesting to schedule 1 f/u visit near the end of the 30-day window to reassess strength and further update HEP if needed, but may cancel if he feels things are doing well.  OBJECTIVE IMPAIRMENTS: decreased activity tolerance, decreased endurance,  decreased mobility, difficulty walking, decreased ROM, decreased strength, hypomobility, increased fascial restrictions, impaired perceived functional ability, increased muscle spasms, impaired flexibility, and pain.   ACTIVITY LIMITATIONS: squatting, bed mobility, dressing, locomotion level, and caring for others  PARTICIPATION LIMITATIONS: interpersonal relationship, community activity, yard work, and recreational activities  PERSONAL FACTORS: Past/current experiences, Time since onset of injury/illness/exacerbation, and 3+ comorbidities: R hip impingement syndrome, HTN, GERD, cancer - malignant tumor of small bowel  are also affecting patient's functional outcome.   REHAB POTENTIAL: Good  CLINICAL DECISION MAKING: Stable/uncomplicated  EVALUATION COMPLEXITY: Low   GOALS: Goals reviewed with patient? Yes  SHORT TERM GOALS: Target date: 09/19/2022  Patient will be independent with initial HEP. Baseline:  Goal status: MET  09/14/22  LONG TERM GOALS: Target date: 10/10/2022  Patient will be independent with advanced/ongoing HEP to improve outcomes and carryover.  Baseline:  Goal status: MET  09/26/22  2.  Patient will report at least 50-75% improvement in R hip pain to improve QOL. Baseline: Pain up to 10/10 Goal status: MET  09/26/22 - pt reports at least 75% improvement (mentally 100%)  3.  Patient will demonstrate improved B LE strength to 5/5 for improved stability and ease of mobility. Baseline: Refer to above MMT table Goal status: PARTIALLY MET  09/26/22 - met except R hip ABD & ER and R ankle DF 4+/5  4.  Patient will be able to ambulate 600' with normal gait pattern without increased pain to access community.  Baseline: Limited walking tolerance due to pain Goal status: MET  09/26/22  5.  Patient will report >/= 78% on LEFS to demonstrate improved functional ability. Baseline: 55 / 80 = 68.8 % Goal status: MET  09/26/22 - 74 / 80 = 92.5 %  6.  Patient will report ability  to play tennis or pickle ball without limitation due to R hip pain. Baseline:  Goal status: MET  09/26/22 - Pt able to manage intermittent pain using stretches during breaks - able to play for 2 hrs last night  6.  Patient will report ability to mow his lawn without limitation due to R hip pain. Baseline:  Goal status: MET  09/26/22 - no issues now with mowing the lawn    PLAN:  PT FREQUENCY: 2x/week  PT DURATION: 4-6 weeks  PLANNED INTERVENTIONS: Therapeutic exercises, Therapeutic activity, Neuromuscular re-education, Balance training, Gait training, Patient/Family education, Self Care, Joint mobilization, Dry Needling, Electrical stimulation, Spinal manipulation, Spinal mobilization, Cryotherapy, Moist heat, Taping, Ultrasound, Ionotophoresis 4mg /ml Dexamethasone, Manual therapy, and Re-evaluation  PLAN FOR NEXT SESSION: transition to HEP + 30-day hold   Marry Guan, PT 09/26/2022, 8:48 AM  PHYSICAL THERAPY DISCHARGE SUMMARY  Visits from Start of Care: 8  Current functional level related to goals / functional outcomes: From note above "Marqel "[name] [name]" reports he feels much better in his ability to manage his pain with the stretches and exercises, noting at least 75% improvement in pain since start of PT and 100% improvement mentally. He has been able to resume mowing the lawn w/o issues and playing tennis managing the pain with stretches during breaks in the game. His overall LE strength is now grossly 5/5 with only exceptions being R hip ABD and ER as well as R DF 4+/5."   Remaining deficits: See above    Education / Equipment: HEP  Plan: Patient agrees to discharge.   Patient is being discharged due to meeting the stated rehab goals.  Refer to above clinical impression and goal assessment for status as of last visit on 09/26/2022. Patient was placed on hold for 30 days and has not needed to return to PT, therefore will proceed with discharge from PT for this episode.       Jena Gauss, PT  11/22/2022 4:33 PM

## 2022-09-27 ENCOUNTER — Other Ambulatory Visit: Payer: Self-pay

## 2022-09-27 ENCOUNTER — Telehealth: Payer: Self-pay | Admitting: Family Medicine

## 2022-09-27 NOTE — Telephone Encounter (Signed)
Pt is calling and needs new rx's to use with his testosterone. Pt need new rx BD plastic pack  3 ml syringe  18 g x 1.5 and new rx BD precision guide needle 18 g x 1 in  [id number] - [place], [place] - [address], [address] AT [facility] [address] Phone: [phone]  Fax: [phone]

## 2022-09-28 ENCOUNTER — Other Ambulatory Visit: Payer: Self-pay

## 2022-09-29 ENCOUNTER — Other Ambulatory Visit: Payer: Self-pay

## 2022-09-29 NOTE — Telephone Encounter (Signed)
Called pt pharmacy for Rx per pt request, placed on hold for long and no answer, phone hanged up.

## 2022-10-02 NOTE — Telephone Encounter (Signed)
Pt is calling back about the syringe that he call for and stated if he don't get the he will be in [place] and want be able to take his medication .

## 2022-10-03 NOTE — Telephone Encounter (Signed)
Noted  

## 2022-10-03 NOTE — Telephone Encounter (Signed)
Pt call and stated he no longer need the syringe sent because he have already got them.

## 2022-10-25 ENCOUNTER — Ambulatory Visit: Payer: Medicare Other | Admitting: Physical Therapy

## 2022-10-30 ENCOUNTER — Emergency Department (HOSPITAL_BASED_OUTPATIENT_CLINIC_OR_DEPARTMENT_OTHER)
Admission: EM | Admit: 2022-10-30 | Discharge: 2022-10-30 | Disposition: A | Discharge status: Home / Self Care | Payer: Medicare Other | Source: Home / Self Care | Attending: Emergency Medicine | Admitting: Emergency Medicine

## 2022-10-30 ENCOUNTER — Other Ambulatory Visit: Payer: Self-pay

## 2022-10-30 DIAGNOSIS — N289 Disorder of kidney and ureter, unspecified: Secondary | ICD-10-CM | POA: Diagnosis not present

## 2022-10-30 DIAGNOSIS — I10 Essential (primary) hypertension: Secondary | ICD-10-CM | POA: Diagnosis not present

## 2022-10-30 DIAGNOSIS — Z8547 Personal history of malignant neoplasm of testis: Secondary | ICD-10-CM | POA: Diagnosis not present

## 2022-10-30 DIAGNOSIS — Z79899 Other long term (current) drug therapy: Secondary | ICD-10-CM | POA: Insufficient documentation

## 2022-10-30 LAB — CBC WITH DIFFERENTIAL/PLATELET
Abs Immature Granulocytes: 0.02 10*3/uL (ref 0.00–0.07)
Basophils Absolute: 0 10*3/uL (ref 0.0–0.1)
Basophils Relative: 1 %
Eosinophils Absolute: 0.3 10*3/uL (ref 0.0–0.5)
Eosinophils Relative: 4 %
HCT: 42.8 % (ref 39.0–52.0)
Hemoglobin: 13.8 g/dL (ref 13.0–17.0)
Immature Granulocytes: 0 %
Lymphocytes Relative: 15 %
Lymphs Abs: 0.9 10*3/uL (ref 0.7–4.0)
MCH: 25.9 pg — ABNORMAL LOW (ref 26.0–34.0)
MCHC: 32.2 g/dL (ref 30.0–36.0)
MCV: 80.5 fL (ref 80.0–100.0)
Monocytes Absolute: 0.6 10*3/uL (ref 0.1–1.0)
Monocytes Relative: 9 %
Neutro Abs: 4.2 10*3/uL (ref 1.7–7.7)
Neutrophils Relative %: 71 %
Platelets: 188 10*3/uL (ref 150–400)
RBC: 5.32 MIL/uL (ref 4.22–5.81)
RDW: 13.5 % (ref 11.5–15.5)
WBC: 6 10*3/uL (ref 4.0–10.5)
nRBC: 0 % (ref 0.0–0.2)

## 2022-10-30 LAB — COMPREHENSIVE METABOLIC PANEL
ALT: 18 U/L (ref 0–44)
AST: 19 U/L (ref 15–41)
Albumin: 3.5 g/dL (ref 3.5–5.0)
Alkaline Phosphatase: 49 U/L (ref 38–126)
Anion gap: 9 (ref 5–15)
BUN: 25 mg/dL — ABNORMAL HIGH (ref 8–23)
CO2: 23 mmol/L (ref 22–32)
Calcium: 8.7 mg/dL — ABNORMAL LOW (ref 8.9–10.3)
Chloride: 106 mmol/L (ref 98–111)
Creatinine, Ser: 1.65 mg/dL — ABNORMAL HIGH (ref 0.61–1.24)
GFR, Estimated: 45 mL/min — ABNORMAL LOW (ref >60)
Glucose, Bld: 112 mg/dL — ABNORMAL HIGH (ref 70–99)
Potassium: 3.7 mmol/L (ref 3.5–5.1)
Sodium: 138 mmol/L (ref 135–145)
Total Bilirubin: 0.7 mg/dL (ref 0.3–1.2)
Total Protein: 6.1 g/dL — ABNORMAL LOW (ref 6.5–8.1)

## 2022-10-30 MED ORDER — LOSARTAN POTASSIUM 25 MG PO TABS
100.0000 mg | ORAL_TABLET | Freq: Once | ORAL | Status: AC
  Administered 2022-10-30: 100 mg via ORAL
  Filled 2022-10-30: qty 4

## 2022-10-30 MED ORDER — ACETAMINOPHEN 325 MG PO TABS
650.0000 mg | ORAL_TABLET | Freq: Once | ORAL | Status: AC
  Administered 2022-10-30: 650 mg via ORAL
  Filled 2022-10-30: qty 2

## 2022-10-30 NOTE — ED Provider Notes (Signed)
[facility] Provider Note   CSN: [id number] Arrival date & time: 10/30/22  1640     History  Chief Complaint  Patient presents with   Hypertension    [name] [name] is a 67 y.o. male.  The history is provided by the patient and the spouse.  Patient with history of hypertension presents with elevated blood pressure.  Patient reports he has been on losartan and has been compliant.  He reports over the past several days has had mild frontal headache and checked his blood pressure and noted that it was elevated.  He has no other associated symptoms.  No fevers or vomiting.  No visual changes.  No focal weakness.  No chest or abdominal pain  Patient reports he just got back from [place] several days ago.  He did travel there to help his son who is recovering from joint replacement.  Patient reports that he had very little sleep and has been drinking increased coffee.  He called his PCP today about his blood pressure & was told to go to the ER    Past Medical History:  Diagnosis Date   Ankle fracture    Cancer (HCC) 1974   testicular cancer-at age 47   Cataract    bilateral sx   GERD (gastroesophageal reflux disease)    hx of   Hx of colonic polyps    pt unsure,thinks this was done in 1982 in [place]. no reports in EPIC   Hyperlipidemia    on meds   Hypertension    on meds   Kidney stones    Patella fracture    Retinal detachment    OD   Retinal tear of right eye    Syncopal episodes 2008    Home Medications Prior to Admission medications   Medication Sig Start Date End Date Taking? Authorizing Provider  Iron, Ferrous Sulfate, 325 (65 Fe) MG TABS Take 325 mg by mouth 2 (two) times daily with a meal. 08/22/21   [name], MD  losartan (COZAAR) 50 MG tablet Take 1 tablet (50 mg total) by mouth daily. 09/11/22   [name], MD  rosuvastatin (CRESTOR) 40 MG tablet TAKE 1 TABLET BY MOUTH EVERY DAY 08/07/22   [name], MD   sildenafil (VIAGRA) 100 MG tablet Take 1 tablet (100 mg total) by mouth as needed for erectile dysfunction. 07/11/22   [name], MD  Syringe/Needle, Disp, (SYRINGE 3CC/22GX1-1/2") 22G X 1-1/2" 3 ML MISC 1 Application by Does not apply route every 14 (fourteen) days. 07/13/22   [name], MD  testosterone cypionate (DEPOTESTOSTERONE CYPIONATE) 200 MG/ML injection Inject 1 mL (200 mg total) into the muscle every 14 (fourteen) days. 08/29/22   [name], MD      Allergies    Patient has no known allergies.    Review of Systems   Review of Systems  Constitutional:  Negative for fever.  Eyes:  Negative for visual disturbance.  Respiratory:  Negative for shortness of breath.   Cardiovascular:  Negative for chest pain.  Gastrointestinal:  Negative for abdominal pain.  Neurological:  Positive for headaches. Negative for speech difficulty, weakness and numbness.    Physical Exam Updated Vital Signs BP (!) 231/118   Pulse 61   Temp 98.4 F (36.9 C) (Oral)   Resp 12   Ht 1.803 m (5\' 11" )   Wt 106.6 kg   SpO2 97%   BMI 32.78 kg/m  Physical Exam CONSTITUTIONAL: Well developed/well nourished HEAD: Normocephalic/atraumatic EYES: EOMI/PERRL, no nystagmus, no ptosis ENMT: Mucous membranes moist NECK: supple no meningeal signs SPINE/BACK:entire spine nontender CV: S1/S2 noted, no murmurs/rubs/gallops noted LUNGS: Lungs are clear to auscultation bilaterally, no apparent distress ABDOMEN: soft, nontender, no rebound or guarding GU:no cva tenderness NEURO:Awake/alert, face symmetric, no arm or leg drift is noted Equal 5/5 strength with shoulder abduction, elbow flex/extension, wrist flex/extension in upper extremities and equal hand grips bilaterally Equal 5/5 strength with hip flexion,knee flex/extension, foot dorsi/plantar flexion Cranial nerves 3/4/5/6/10/16/08/11/12 tested and intact Gait normal without ataxia No past pointing Sensation to light touch intact in all  extremities EXTREMITIES: pulses normal, full ROM SKIN: warm, color normal PSYCH: no abnormalities of mood noted, alert and oriented to situation  ED Results / Procedures / Treatments   Labs (all labs ordered are listed, but only abnormal results are displayed) Labs Reviewed  CBC WITH DIFFERENTIAL/PLATELET - Abnormal; Notable for the following components:      Result Value   MCH 25.9 (*)    All other components within normal limits  COMPREHENSIVE METABOLIC PANEL - Abnormal; Notable for the following components:   Glucose, Bld 112 (*)    BUN 25 (*)    Creatinine, Ser 1.65 (*)    Calcium 8.7 (*)    Total Protein 6.1 (*)    GFR, Estimated 45 (*)    All other components within normal limits    EKG EKG Interpretation Date/Time:  Monday October 30 2022 18:24:00 EDT Ventricular Rate:  57 PR Interval:  197 QRS Duration:  112 QT Interval:  427 QTC Calculation: 416 R Axis:   21  Text Interpretation: Sinus rhythm Incomplete right bundle branch block No significant change since last tracing Confirmed by Zadie Rhine ([id number]) on 10/30/2022 7:00:00 PM  Radiology No results found.  Procedures Procedures    Medications Ordered in ED Medications  acetaminophen (TYLENOL) tablet 650 mg (650 mg Oral Given 10/30/22 1829)  losartan (COZAAR) tablet 100 mg (100 mg Oral Given 10/30/22 1828)    ED Course/ Medical Decision Making/ A&P Clinical Course as of 10/30/22 1929  Mon Oct 30, 2022  1901 Creatinine(!): 1.65 Improved renal insufficiency [DW]  1928 Patient presents for elevated blood pressure but no findings of acute hypertensive emergency. Patient well-appearing, no acute distress. He is ambulatory  Patient was comfortable for discharge home.  His blood pressure is mildly improved.  He will increase his losartan to 100 mg daily.  He will check his blood pressure each day and follow-up with his PCP in the next week  We discussed strict ER return precautions [DW]    Clinical Course  User Index [DW] Zadie Rhine, MD                             Medical Decision Making Amount and/or Complexity of Data Reviewed Labs: ordered. Decision-making details documented in ED Course. ECG/medicine tests: ordered.  Risk OTC drugs. Prescription drug management.   This patient presents to the ED for concern of elevated blood pressure and headache, this involves an extensive number of treatment options, and is a complaint that carries with it a high risk of complications and morbidity.  The differential diagnosis includes but is not limited to subarachnoid hemorrhage, intracranial hemorrhage, meningitis, encephalitis, CVST, temporal arteritis, migraine    Comorbidities that complicate the patient evaluation: Patient's presentation is complicated by their history of hypertension  Social Determinants of Health: Patient's  recent family stress   increases the complexity of managing their presentation  Additional history obtained: Additional history obtained from spouse Records reviewed Primary Care Documents  Lab Tests: I Ordered, and personally interpreted labs.  The pertinent results include: Improved renal insufficiency   Cardiac Monitoring: The patient was maintained on a cardiac monitor.  I personally viewed and interpreted the cardiac monitor which showed an underlying rhythm of:  sinus rhythm  Medicines ordered and prescription drug management: I ordered medication including losartan for hypertension Reevaluation of the patient after these medicines showed that the patient    improved  Test Considered: I considered further testing, patient appears to have no signs of hypertensive emergency.   Reevaluation: After the interventions noted above, I reevaluated the patient and found that they have :improved  Complexity of problems addressed: Patient's presentation is most consistent with  acute presentation with potential threat to life or bodily  function  Disposition: After consideration of the diagnostic results and the patient's response to treatment,  I feel that the patent would benefit from discharge   .           Final Clinical Impression(s) / ED Diagnoses Final diagnoses:  Primary hypertension    Rx / DC Orders ED Discharge Orders     None         Zadie Rhine, MD 10/30/22 1929

## 2022-10-30 NOTE — ED Notes (Signed)
Discharge paperwork reviewed entirely with patient, including follow up care. Pain was under control. The patient received instruction and coaching on their prescriptions, and all follow-up questions were answered.  Pt verbalized understanding as well as all parties involved. No questions or concerns voiced at the time of discharge. No acute distress noted.   Pt ambulated out to PVA without incident or assistance.  

## 2022-10-30 NOTE — ED Triage Notes (Signed)
Patient presents to ED via POV from home. Here due to hypertension. History of same. Denies missing any doses of medications. Denies visual changes or dizziness. Endorses headache.

## 2022-10-31 ENCOUNTER — Telehealth: Payer: Self-pay | Admitting: Family Medicine

## 2022-10-31 ENCOUNTER — Encounter: Payer: Self-pay | Admitting: Family Medicine

## 2022-10-31 ENCOUNTER — Ambulatory Visit: Payer: Medicare Other | Admitting: Family Medicine

## 2022-10-31 VITALS — BP 188/102 | HR 63 | Temp 98.2°F | Wt 238.0 lb

## 2022-10-31 DIAGNOSIS — I10 Essential (primary) hypertension: Secondary | ICD-10-CM | POA: Diagnosis not present

## 2022-10-31 DIAGNOSIS — N1832 Chronic kidney disease, stage 3b: Secondary | ICD-10-CM

## 2022-10-31 MED ORDER — LOSARTAN POTASSIUM 50 MG PO TABS: 100.0000 mg | ORAL_TABLET | Freq: Every day | ORAL | Status: DC

## 2022-10-31 MED ORDER — HYDRALAZINE HCL 25 MG PO TABS: 25.0000 mg | ORAL_TABLET | Freq: Three times a day (TID) | ORAL | 0 refills | Status: DC

## 2022-10-31 NOTE — Telephone Encounter (Addendum)
Pt went to er yesterday and his bp now is 190/110 also pt has headache and appt sch for today at 4 pm. Pt was wondering if he should double up potassium medication

## 2022-10-31 NOTE — Progress Notes (Signed)
   Subjective:    Patient ID: [name] [name], male    DOB: 10/26/1955, 67 y.o.   MRN: [id number]  HPI Here to follow up on an ED visit yesterday for very high BP. He had been doing well on Losartan 50 mg daily until the past week. He went to [place] to help his son after a recent surgery, and he did not check his BP. While driving back he developed a frontal headache that persisted a few days. Yesterday he checked his BP at home and he had readings from 180-200 over 100-110. No chest pain or SOB. He went to the ED, where his exam and EKG were unremarkable. Labs were normal except for a creatinine of 1.65 and a GFR of 45. He was advised to increase the Losartan to 100 mg daily and to see [place]. He has CKD stage 3, and he sees Dr. Roseanne Reno for nephrology care. His last creatinine in December was 1.85, so his renal function is stable. He was also found to have "nephrotic range" proteinuria. Today he feels fine except for a mild headache.    Review of Systems  Constitutional: Negative.   Respiratory: Negative.    Cardiovascular: Negative.   Neurological:  Positive for headaches.       Objective:   Physical Exam Constitutional:      Appearance: Normal appearance. He is not ill-appearing.  Cardiovascular:     Rate and Rhythm: Normal rate and regular rhythm.     Pulses: Normal pulses.     Heart sounds: Normal heart sounds.  Pulmonary:     Effort: Pulmonary effort is normal.     Breath sounds: Normal breath sounds.  Musculoskeletal:     Right lower leg: No edema.     Left lower leg: No edema.  Neurological:     General: No focal deficit present.     Mental Status: He is alert and oriented to person, place, and time.           Assessment & Plan:  HTN which is poorly controlled. We will keep him on Losartan 100 mg daily, and we will add Hydralazine 25 mg TID. He is scheduled to see Dr. Glenna Fellows again on 11-17-22, and she will address the BP at that time. We spent a total of (35   )  minutes reviewing records and discussing these issues.  Gershon Crane, MD

## 2022-11-02 ENCOUNTER — Other Ambulatory Visit: Payer: Self-pay | Admitting: Family Medicine

## 2022-11-03 ENCOUNTER — Encounter: Payer: Self-pay | Admitting: Family Medicine

## 2022-11-07 ENCOUNTER — Encounter: Payer: Self-pay | Admitting: Family Medicine

## 2022-11-07 DIAGNOSIS — I10 Essential (primary) hypertension: Secondary | ICD-10-CM

## 2022-11-07 NOTE — Telephone Encounter (Signed)
Increase the Hydralazine to 2 tablets (total of 50 mg) TID for a week and report back

## 2022-11-08 MED ORDER — CLONIDINE HCL 0.1 MG PO TABS: 0.1000 mg | ORAL_TABLET | Freq: Two times a day (BID) | ORAL | 0 refills | Status: DC

## 2022-11-08 NOTE — Telephone Encounter (Signed)
Keep taking the 50 mg of Hydralazine TID, but we will add Clonidine 0.1 mg BID. Call in #[phone] with no rf, and have him report back on Friday

## 2022-11-13 NOTE — Addendum Note (Signed)
Addended by: Gershon Crane A on: 11/13/2022 05:22 PM   Modules accepted: Orders

## 2022-11-13 NOTE — Telephone Encounter (Signed)
increase the Clonidine to 2 pills (or 0.2 mg) BID. I have also ordered a renal artery [place] to rule out renal artery stenosis (which can cause resistant HTN).

## 2022-11-20 ENCOUNTER — Ambulatory Visit (INDEPENDENT_AMBULATORY_CARE_PROVIDER_SITE_OTHER): Payer: Medicare Other

## 2022-11-20 ENCOUNTER — Other Ambulatory Visit: Payer: Self-pay | Admitting: Family Medicine

## 2022-11-20 VITALS — Ht 71.0 in | Wt 238.0 lb

## 2022-11-20 DIAGNOSIS — Z0000 Encounter for general adult medical examination without abnormal findings: Secondary | ICD-10-CM | POA: Diagnosis not present

## 2022-11-20 MED ORDER — HYDRALAZINE HCL 25 MG PO TABS: 25.0000 mg | ORAL_TABLET | Freq: Three times a day (TID) | ORAL | 0 refills | Status: DC

## 2022-11-20 MED ORDER — LOSARTAN POTASSIUM 100 MG PO TABS: 100.0000 mg | ORAL_TABLET | Freq: Every day | ORAL | 0 refills | Status: DC

## 2022-11-20 MED ORDER — CLONIDINE HCL 0.1 MG PO TABS: 0.1000 mg | ORAL_TABLET | Freq: Two times a day (BID) | ORAL | 0 refills | Status: DC

## 2022-11-20 NOTE — Telephone Encounter (Signed)
FYI Spoke with pt stated hat he has appointment scheduled for the [place] on 12/05/22. Pt also wants to update Dr Clent Ridges that he had an appointment with his Nephrologist who prescribed  chlorthalidone 25 mg tablet pt is taking 1/2 a tablet at AM.

## 2022-11-20 NOTE — Patient Instructions (Addendum)
[name] [name] , Thank you for taking time to come for your Medicare Wellness Visit. I appreciate your ongoing commitment to your health goals. Please review the following plan we discussed and let me know if I can assist you in the future.   Referrals/Orders/Follow-Ups/Clinician Recommendations:   This is a list of the screening recommended for you and due dates:  Health Maintenance  Topic Date Due   DTaP/Tdap/Td vaccine (1 - Tdap) Never done   Zoster (Shingles) Vaccine (1 of 2) Never done   Pneumonia Vaccine (1 of 1 - PCV) Never done   COVID-19 Vaccine (5 - 2023-24 season) 12/09/2021   Flu Shot  11/09/2022   Medicare Annual Wellness Visit  11/20/2023   Colon Cancer Screening  04/07/2024   Hepatitis C Screening  Completed   HPV Vaccine  Aged Out    Advanced directives: (Copy Requested) Please bring a copy of your health care power of attorney and living will to the office to be added to your chart at your convenience.  Next Medicare Annual Wellness Visit scheduled for next year: Yes  Preventive Care 97 Years and Older, Male  Preventive care refers to lifestyle choices and visits with your health care provider that can promote health and wellness. What does preventive care include? A yearly physical exam. This is also called an annual well check. Dental exams once or twice a year. Routine eye exams. Ask your health care provider how often you should have your eyes checked. Personal lifestyle choices, including: Daily care of your teeth and gums. Regular physical activity. Eating a healthy diet. Avoiding tobacco and drug use. Limiting alcohol use. Practicing safe sex. Taking low doses of aspirin every day. Taking vitamin and mineral supplements as recommended by your health care provider. What happens during an annual well check? The services and screenings done by your health care provider during your annual well check will depend on your age, overall health, lifestyle risk  factors, and family history of disease. Counseling  Your health care provider may ask you questions about your: Alcohol use. Tobacco use. Drug use. Emotional well-being. Home and relationship well-being. Sexual activity. Eating habits. History of falls. Memory and ability to understand (cognition). Work and work Astronomer. Screening  You may have the following tests or measurements: Height, weight, and BMI. Blood pressure. Lipid and cholesterol levels. These may be checked every 5 years, or more frequently if you are over 38 years old. Skin check. Lung cancer screening. You may have this screening every year starting at age 57 if you have a 30-pack-year history of smoking and currently smoke or have quit within the past 15 years. Fecal occult blood test (FOBT) of the stool. You may have this test every year starting at age 25. Flexible sigmoidoscopy or colonoscopy. You may have a sigmoidoscopy every 5 years or a colonoscopy every 10 years starting at age 89. Prostate cancer screening. Recommendations will vary depending on your family history and other risks. Hepatitis C blood test. Hepatitis B blood test. Sexually transmitted disease (STD) testing. Diabetes screening. This is done by checking your blood sugar (glucose) after you have not eaten for a while (fasting). You may have this done every 1-3 years. Abdominal aortic aneurysm (AAA) screening. You may need this if you are a current or former smoker. Osteoporosis. You may be screened starting at age 30 if you are at high risk. Talk with your health care provider about your test results, treatment options, and if necessary, the need for  more tests. Vaccines  Your health care provider may recommend certain vaccines, such as: Influenza vaccine. This is recommended every year. Tetanus, diphtheria, and acellular pertussis (Tdap, Td) vaccine. You may need a Td booster every 10 years. Zoster vaccine. You may need this after age  37. Pneumococcal 13-valent conjugate (PCV13) vaccine. One dose is recommended after age 65. Pneumococcal polysaccharide (PPSV23) vaccine. One dose is recommended after age 25. Talk to your health care provider about which screenings and vaccines you need and how often you need them. This information is not intended to replace advice given to you by your health care provider. Make sure you discuss any questions you have with your health care provider. Document Released: 04/23/2015 Document Revised: 12/15/2015 Document Reviewed: 01/26/2015 Elsevier Interactive Patient Education  2017 [employer].  Fall Prevention in the Home Falls can cause injuries. They can happen to people of all ages. There are many things you can do to make your home safe and to help prevent falls. What can I do on the outside of my home? Regularly fix the edges of walkways and driveways and fix any cracks. Remove anything that might make you trip as you walk through a door, such as a raised step or threshold. Trim any bushes or trees on the path to your home. Use bright outdoor lighting. Clear any walking paths of anything that might make someone trip, such as rocks or tools. Regularly check to see if handrails are loose or broken. Make sure that both sides of any steps have handrails. Any raised decks and porches should have guardrails on the edges. Have any leaves, snow, or ice cleared regularly. Use sand or salt on walking paths during winter. Clean up any spills in your garage right away. This includes oil or grease spills. What can I do in the bathroom? Use night lights. Install grab bars by the toilet and in the tub and shower. Do not use towel bars as grab bars. Use non-skid mats or decals in the tub or shower. If you need to sit down in the shower, use a plastic, non-slip stool. Keep the floor dry. Clean up any water that spills on the floor as soon as it happens. Remove soap buildup in the tub or shower  regularly. Attach bath mats securely with double-sided non-slip rug tape. Do not have throw rugs and other things on the floor that can make you trip. What can I do in the bedroom? Use night lights. Make sure that you have a light by your bed that is easy to reach. Do not use any sheets or blankets that are too big for your bed. They should not hang down onto the floor. Have a firm chair that has side arms. You can use this for support while you get dressed. Do not have throw rugs and other things on the floor that can make you trip. What can I do in the kitchen? Clean up any spills right away. Avoid walking on wet floors. Keep items that you use a lot in easy-to-reach places. If you need to reach something above you, use a strong step stool that has a grab bar. Keep electrical cords out of the way. Do not use floor polish or wax that makes floors slippery. If you must use wax, use non-skid floor wax. Do not have throw rugs and other things on the floor that can make you trip. What can I do with my stairs? Do not leave any items on the stairs. Make  sure that there are handrails on both sides of the stairs and use them. Fix handrails that are broken or loose. Make sure that handrails are as long as the stairways. Check any carpeting to make sure that it is firmly attached to the stairs. Fix any carpet that is loose or worn. Avoid having throw rugs at the top or bottom of the stairs. If you do have throw rugs, attach them to the floor with carpet tape. Make sure that you have a light switch at the top of the stairs and the bottom of the stairs. If you do not have them, ask someone to add them for you. What else can I do to help prevent falls? Wear shoes that: Do not have high heels. Have rubber bottoms. Are comfortable and fit you well. Are closed at the toe. Do not wear sandals. If you use a stepladder: Make sure that it is fully opened. Do not climb a closed stepladder. Make sure that  both sides of the stepladder are locked into place. Ask someone to hold it for you, if possible. Clearly mark and make sure that you can see: Any grab bars or handrails. First and last steps. Where the edge of each step is. Use tools that help you move around (mobility aids) if they are needed. These include: Canes. Walkers. Scooters. Crutches. Turn on the lights when you go into a dark area. Replace any light bulbs as soon as they burn out. Set up your furniture so you have a clear path. Avoid moving your furniture around. If any of your floors are uneven, fix them. If there are any pets around you, be aware of where they are. Review your medicines with your doctor. Some medicines can make you feel dizzy. This can increase your chance of falling. Ask your doctor what other things that you can do to help prevent falls. This information is not intended to replace advice given to you by your health care provider. Make sure you discuss any questions you have with your health care provider. Document Released: 01/21/2009 Document Revised: 09/02/2015 Document Reviewed: 05/01/2014 Elsevier Interactive Patient Education  2017 [employer].

## 2022-11-20 NOTE — Progress Notes (Addendum)
Subjective:   [name] [name] is a 67 y.o. male who presents for Medicare Annual/Subsequent preventive examination.  Visit Complete: Virtual  I connected with  [name] [name] on 11/27/22 by a audio enabled telemedicine application and verified that I am speaking with the correct person using two identifiers.  Patient Location: Home  Provider Location: Home Office  I discussed the limitations of evaluation and management by telemedicine. The patient expressed understanding and agreed to proceed.  Patient Medicare AWV questionnaire was completed by the patient on 11/20/22; I have confirmed that all information answered by patient is correct and no changes since this date.  Review of Systems    Vital Signs: Unable to obtain new vitals due to this being a telehealth visit.  Cardiac Risk Factors include: advanced age (>71men, >67 women);male gender;hypertension     Objective:    Today's Vitals   11/20/22 0821  Weight: 238 lb (108 kg)  Height: 5\' 11"  (1.803 m)   Body mass index is 33.19 kg/m.     11/20/2022    8:30 AM 08/29/2022    8:06 AM 11/16/2021    8:29 AM 04/01/2021    2:27 AM 11/03/2020    8:12 AM 04/02/2016    3:26 AM 03/20/2016    1:49 PM  Advanced Directives  Does Patient Have a Medical Advance Directive? Yes Yes Yes No Yes No Yes  Type of Estate agent of [place];Living will Healthcare Power of [place];Living will Healthcare Power of [place];Living will  Living will;Healthcare Power of [employer] Power of [place];Living will  Does patient want to make changes to medical advance directive?  No - Patient declined No - Patient declined      Copy of Healthcare Power of Attorney in Chart? No - copy requested  No - copy requested  No - copy requested      Current Medications (verified) Outpatient Encounter Medications as of 11/20/2022  Medication Sig   Iron, Ferrous Sulfate, 325 (65 Fe) MG TABS Take 325 mg by mouth 2 (two) times daily with a  meal. (Patient not taking: Reported on 10/31/2022)   rosuvastatin (CRESTOR) 40 MG tablet TAKE 1 TABLET BY MOUTH EVERY DAY   sildenafil (VIAGRA) 100 MG tablet Take 1 tablet (100 mg total) by mouth as needed for erectile dysfunction.   Syringe/Needle, Disp, (SYRINGE 3CC/22GX1-1/2") 22G X 1-1/2" 3 ML MISC 1 Application by Does not apply route every 14 (fourteen) days.   testosterone cypionate (DEPOTESTOSTERONE CYPIONATE) 200 MG/ML injection Inject 1 mL (200 mg total) into the muscle every 14 (fourteen) days.   [DISCONTINUED] cloNIDine (CATAPRES) 0.1 MG tablet Take 1 tablet (0.1 mg total) by mouth 2 (two) times daily.   [DISCONTINUED] hydrALAZINE (APRESOLINE) 25 MG tablet Take 1 tablet (25 mg total) by mouth 3 (three) times daily.   [DISCONTINUED] losartan (COZAAR) 50 MG tablet Take 2 tablets (100 mg total) by mouth daily.   No facility-administered encounter medications on file as of 11/20/2022.    Allergies (verified) Lisinopril   History: Past Medical History:  Diagnosis Date   Ankle fracture    Cancer (HCC) 1974   testicular cancer-at age 34   Cataract    bilateral sx   GERD (gastroesophageal reflux disease)    hx of   Hx of colonic polyps    pt unsure,thinks this was done in 1982 in [place]. no reports in EPIC   Hyperlipidemia    on meds   Hypertension    on meds  Kidney stones    Patella fracture    Retinal detachment    OD   Retinal tear of right eye    Syncopal episodes 2008   Past Surgical History:  Procedure Laterality Date   AIR/FLUID EXCHANGE Right 06/16/2014   Procedure: AIR/FLUID EXCHANGE;  Surgeon: Sherrie George, MD;  Location: [facility] OR;  Service: Ophthalmology;  Laterality: Right;   BIOPSY  04/04/2021   Procedure: BIOPSY;  Surgeon: Meridee Score Netty Starring., MD;  Location: [facility] ENDOSCOPY;  Service: Gastroenterology;;   CATARACT EXTRACTION     CATARACT EXTRACTION  09/2014   rt eye//left 01/2015   CATARACT EXTRACTION, BILATERAL  2016   per Dr. Elmer Picker    COLONOSCOPY   02/16/2020   per Dr. Rhea Belton, adenomatous polyps, repeat in 3 yrs   ENTEROSCOPY N/A 04/04/2021   Procedure: ENTEROSCOPY;  Surgeon: Mansouraty, Netty Starring., MD;  Location: [facility] ENDOSCOPY;  Service: Gastroenterology;  Laterality: N/A;   EYE SURGERY     GAS INSERTION Right 06/16/2014   Procedure: INSERTION OF GAS;  Surgeon: Sherrie George, MD;  Location: [facility] OR;  Service: Ophthalmology;  Laterality: Right;  [id number]   GIVENS CAPSULE STUDY N/A 04/02/2021   Procedure: GIVENS CAPSULE STUDY;  Surgeon: Lemar Lofty., MD;  Location: [facility] ENDOSCOPY;  Service: Gastroenterology;  Laterality: N/A;   HYDROCELE EXCISION     IR ANGIOGRAM VISCERAL SELECTIVE  04/04/2021   IR US GUIDE VASC ACCESS LEFT  04/04/2021   KNEE ARTHROSCOPY Right    x 2   LASER PHOTO ABLATION Right 06/16/2014   Procedure: LASER PHOTO ABLATION;  Surgeon: Sherrie George, MD;  Location: [facility] OR;  Service: Ophthalmology;  Laterality: Right;  Headscope laser and endolaser    ORCHIECTOMY     right testicle   REPAIR OF COMPLEX TRACTION RETINAL DETACHMENT Right 06/16/2014   Procedure: REPAIR OF COMPLEX TRACTION RETINAL DETACHMENT;  Surgeon: Sherrie George, MD;  Location: [facility] OR;  Service: Ophthalmology;  Laterality: Right;   RETINAL DETACHMENT SURGERY  06/2014   SUBMUCOSAL TATTOO INJECTION  04/04/2021   Procedure: SUBMUCOSAL TATTOO INJECTION;  Surgeon: Lemar Lofty., MD;  Location: [facility] ENDOSCOPY;  Service: Gastroenterology;;   TONSILLECTOMY     VITRECTOMY Right 06/16/2014   WISDOM TOOTH EXTRACTION     Family History  Problem Relation Age of Onset   Parkinson's disease Mother    Arrhythmia Father    Coronary artery disease Other        fhx   Hypertension Other        fhx   Sudden death Other        fhx   Colon cancer Neg Hx    Colon polyps Neg Hx    Esophageal cancer Neg Hx    Rectal cancer Neg Hx    Stomach cancer Neg Hx    Social History   Socioeconomic History   Marital status: Married    Spouse name: Not on  file   Number of children: Not on file   Years of education: Not on file   Highest education level: Not on file  Occupational History   Occupation: Chief Executive Officer: [employer]  Tobacco Use   Smoking status: Never   Smokeless tobacco: Never  Vaping Use   Vaping status: Never Used  Substance and Sexual Activity   Alcohol use: Not Currently    Comment: 2 beers monthly   Drug use: No   Sexual activity: Not on file  Other Topics Concern  Not on file  Social History Narrative   Not on file   Social Determinants of Health   Financial Resource Strain: Low Risk  (11/20/2022)   Overall Financial Resource Strain (CARDIA)    Difficulty of Paying Living Expenses: Not hard at all  Food Insecurity: No Food Insecurity (11/20/2022)   Hunger Vital Sign    Worried About Running Out of Food in the Last Year: Never true    Ran Out of Food in the Last Year: Never true  Transportation Needs: No Transportation Needs (11/20/2022)   PRAPARE - Administrator, Civil Service (Medical): No    Lack of Transportation (Non-Medical): No  Physical Activity: Sufficiently Active (11/20/2022)   Exercise Vital Sign    Days of Exercise per Week: 3 days    Minutes of Exercise per Session: 60 min  Stress: No Stress Concern Present (11/20/2022)   [employer] of Occupational Health - Occupational Stress Questionnaire    Feeling of Stress : Not at all  Social Connections: Socially Integrated (11/20/2022)   Social Connection and Isolation Panel [NHANES]    Frequency of Communication with Friends and Family: More than three times a week    Frequency of Social Gatherings with Friends and Family: More than three times a week    Attends Religious Services: More than 4 times per year    Active Member of [employer] or Organizations: Yes    Attends Engineer, structural: More than 4 times per year    Marital Status: Married    Tobacco Counseling Counseling given: Not  Answered   Clinical Intake:  Pre-visit preparation completed: Yes  Pain : No/denies pain Faces Pain Scale: No hurt  Faces Pain Scale: No hurt  BMI - recorded: 3319 Nutritional Status: BMI > 30  Obese Nutritional Risks: None Diabetes: No  How often do you need to have someone help you when you read instructions, pamphlets, or other written materials from your doctor or pharmacy?: 1 - Never  Interpreter Needed?: No  Information entered by :: Theresa Mulligan LPN   Activities of Daily Living    11/20/2022    8:29 AM 11/20/2022    8:00 AM  In your present state of health, do you have any difficulty performing the following activities:  Hearing? 0 0  Vision? 0 0  Difficulty concentrating or making decisions? 0 0  Walking or climbing stairs? 0 0  Dressing or bathing? 0 0  Doing errands, shopping? 0 0  Preparing Food and eating ? N N  Using the Toilet? N N  In the past six months, have you accidently leaked urine? N N  Do you have problems with loss of bowel control? N N  Managing your Medications? N N  Managing your Finances? N N  Housekeeping or managing your Housekeeping? N N    Patient Care Team: [name], MD as PCP - General (Family Medicine) Corky Crafts, MD as PCP - Cardiology (Cardiology)  Indicate any recent Medical Services you may have received from other than [facility] providers in the past year (date may be approximate).     Assessment:   This is a routine wellness examination for Sudeep.  Hearing/Vision screen Hearing Screening - Comments:: Denies hearing difficulties   Vision Screening - Comments:: Wears rx glasses - up to date with routine eye exams with  Dr Elmer Picker  Dietary issues and exercise activities discussed:     Goals Addressed  This Visit's Progress     Stay Healthy (pt-stated)         Depression Screen    11/20/2022    8:28 AM 03/13/2022    8:57 AM 12/19/2021    3:35 PM 11/16/2021    8:25 AM 10/12/2021    10:29 AM 07/26/2021    3:05 PM 04/19/2021    4:44 PM  PHQ 2/9 Scores  PHQ - 2 Score 0 0 0 0 0 2 0  PHQ- 9 Score  0 0 0 1 8 4     Fall Risk    11/20/2022    8:29 AM 11/20/2022    8:00 AM 07/11/2022    2:00 PM 03/13/2022    8:57 AM 12/19/2021    3:35 PM  Fall Risk   Falls in the past year? 0 0 0 0 0  Number falls in past yr: 0  0 1 0  Injury with Fall? 0 0 0 1 0  Risk for fall due to : No Fall Risks  No Fall Risks No Fall Risks No Fall Risks  Follow up Falls prevention discussed  Falls evaluation completed Falls evaluation completed Falls evaluation completed    MEDICARE RISK AT HOME:    TIMED UP AND GO:  Was the test performed?  No    Cognitive Function:        11/20/2022    8:30 AM 11/16/2021    8:29 AM 11/03/2020    8:14 AM  6CIT Screen  What Year? 0 points 0 points 0 points  What month? 0 points 0 points 0 points  What time? 0 points 0 points 0 points  Count back from 20 0 points 0 points 0 points  Months in reverse 0 points 0 points 0 points  Repeat phrase 0 points 0 points 0 points  Total Score 0 points 0 points 0 points    Immunizations Immunization History  Administered Date(s) Administered   Fluad Quad(high Dose 65+) 04/08/2020   Influenza,inj,Quad PF,6+ Mos 02/20/2013, 12/03/2013, 02/16/2015, 03/01/2016   PFIZER(Purple Top)SARS-COV-2 Vaccination 07/03/2019, 07/24/2019, 04/08/2020, 06/02/2020    TDAP status: Due, Education has been provided regarding the importance of this vaccine. Advised may receive this vaccine at local pharmacy or Health Dept. Aware to provide a copy of the vaccination record if obtained from local pharmacy or Health Dept. Verbalized acceptance and understanding.  Flu Vaccine status: Due, Education has been provided regarding the importance of this vaccine. Advised may receive this vaccine at local pharmacy or Health Dept. Aware to provide a copy of the vaccination record if obtained from local pharmacy or Health Dept. Verbalized acceptance  and understanding.  Pneumococcal vaccine status: Due, Education has been provided regarding the importance of this vaccine. Advised may receive this vaccine at local pharmacy or Health Dept. Aware to provide a copy of the vaccination record if obtained from local pharmacy or Health Dept. Verbalized acceptance and understanding.  Covid-19 vaccine status: Declined, Education has been provided regarding the importance of this vaccine but patient still declined. Advised may receive this vaccine at local pharmacy or Health Dept.or vaccine clinic. Aware to provide a copy of the vaccination record if obtained from local pharmacy or Health Dept. Verbalized acceptance and understanding.  Qualifies for Shingles Vaccine? Yes   Zostavax completed No   Shingrix Completed?: No.    Education has been provided regarding the importance of this vaccine. Patient has been advised to call insurance company to determine out of pocket expense if they have not yet  received this vaccine. Advised may also receive vaccine at local pharmacy or Health Dept. Verbalized acceptance and understanding.  Screening Tests Health Maintenance  Topic Date Due   DTaP/Tdap/Td (1 - Tdap) Never done   Zoster Vaccines- Shingrix (1 of 2) Never done   Pneumonia Vaccine 67+ Years old (1 of 1 - PCV) Never done   COVID-19 Vaccine (5 - 2023-24 season) 12/09/2021   INFLUENZA VACCINE  11/09/2022   Medicare Annual Wellness (AWV)  11/20/2023   Colonoscopy  04/07/2024   Hepatitis C Screening  Completed   HPV VACCINES  Aged Out    Health Maintenance  Health Maintenance Due  Topic Date Due   DTaP/Tdap/Td (1 - Tdap) Never done   Zoster Vaccines- Shingrix (1 of 2) Never done   Pneumonia Vaccine 60+ Years old (1 of 1 - PCV) Never done   COVID-19 Vaccine (5 - 2023-24 season) 12/09/2021   INFLUENZA VACCINE  11/09/2022    Colorectal cancer screening: Type of screening: Colonoscopy. Completed 04/07/21. Repeat every 3 years  Lung Cancer  Screening: (Low Dose CT Chest recommended if Age 65-80 years, 20 pack-year currently smoking OR have quit w/in 15years.) does not qualify.     Additional Screening:  Hepatitis C Screening: 24 qualify; Completed   Vision Screening: Recommended annual ophthalmology exams for early detection of glaucoma and other disorders of the eye. Is the patient up to date with their annual eye exam?  Yes  Who is the provider or what is the name of the office in which the patient attends annual eye exams? Dr Elmer Picker If pt is not established with a provider, would they like to be referred to a provider to establish care? No .   Dental Screening: Recommended annual dental exams for proper oral hygiene  Community Resource Referral / Chronic Care Management:  CRR required this visit?  No   CCM required this visit?  No     Plan:     I have personally reviewed and noted the following in the patient's chart:   Medical and social history Use of alcohol, tobacco or illicit drugs  Current medications and supplements including opioid prescriptions. Patient is not currently taking opioid prescriptions. Functional ability and status Nutritional status Physical activity Advanced directives List of other physicians Hospitalizations, surgeries, and ER visits in previous 12 months Vitals Screenings to include cognitive, depression, and falls Referrals and appointments  In addition, I have reviewed and discussed with patient certain preventive protocols, quality metrics, and best practice recommendations. A written personalized care plan for preventive services as well as general preventive health recommendations were provided to patient.     Tillie Rung, LPN   9/52/8413   After Visit Summary: (MyChart) Due to this being a telephonic visit, the after visit summary with patients personalized plan was offered to patient via MyChart   Nurse Notes: None

## 2022-11-20 NOTE — Telephone Encounter (Signed)
Left pt a message advised to call the office regarding this message

## 2022-11-21 MED ORDER — CLONIDINE HCL 0.2 MG PO TABS: 0.2000 mg | ORAL_TABLET | Freq: Two times a day (BID) | ORAL | 2 refills | Status: DC

## 2022-11-21 MED ORDER — HYDRALAZINE HCL 50 MG PO TABS: 50.0000 mg | ORAL_TABLET | Freq: Three times a day (TID) | ORAL | 2 refills | Status: DC

## 2022-11-21 NOTE — Telephone Encounter (Signed)
Done

## 2022-11-21 NOTE — Addendum Note (Signed)
Addended by: Gershon Crane A on: 11/21/2022 04:56 PM   Modules accepted: Orders

## 2022-11-23 NOTE — Telephone Encounter (Signed)
Yes he can take OTC magnesium

## 2022-11-27 ENCOUNTER — Encounter (HOSPITAL_COMMUNITY): Payer: Self-pay

## 2022-11-27 ENCOUNTER — Other Ambulatory Visit: Payer: Self-pay

## 2022-11-27 ENCOUNTER — Emergency Department (HOSPITAL_COMMUNITY): Payer: Medicare Other

## 2022-11-27 ENCOUNTER — Emergency Department (HOSPITAL_COMMUNITY)
Admission: EM | Admit: 2022-11-27 | Discharge: 2022-11-27 | Disposition: A | Discharge status: Home / Self Care | Payer: Medicare Other | Source: Home / Self Care | Attending: Emergency Medicine | Admitting: Emergency Medicine

## 2022-11-27 DIAGNOSIS — R197 Diarrhea, unspecified: Secondary | ICD-10-CM | POA: Diagnosis not present

## 2022-11-27 DIAGNOSIS — R1031 Right lower quadrant pain: Secondary | ICD-10-CM | POA: Diagnosis not present

## 2022-11-27 DIAGNOSIS — Z79899 Other long term (current) drug therapy: Secondary | ICD-10-CM | POA: Diagnosis not present

## 2022-11-27 DIAGNOSIS — I10 Essential (primary) hypertension: Secondary | ICD-10-CM | POA: Diagnosis not present

## 2022-11-27 DIAGNOSIS — D72829 Elevated white blood cell count, unspecified: Secondary | ICD-10-CM | POA: Insufficient documentation

## 2022-11-27 LAB — COMPREHENSIVE METABOLIC PANEL
ALT: 18 U/L (ref 0–44)
AST: 27 U/L (ref 15–41)
Albumin: 3.8 g/dL (ref 3.5–5.0)
Alkaline Phosphatase: 56 U/L (ref 38–126)
Anion gap: 15 (ref 5–15)
BUN: 30 mg/dL — ABNORMAL HIGH (ref 8–23)
CO2: 23 mmol/L (ref 22–32)
Calcium: 9.2 mg/dL (ref 8.9–10.3)
Chloride: 99 mmol/L (ref 98–111)
Creatinine, Ser: 2.22 mg/dL — ABNORMAL HIGH (ref 0.61–1.24)
GFR, Estimated: 31 mL/min — ABNORMAL LOW (ref >60)
Glucose, Bld: 139 mg/dL — ABNORMAL HIGH (ref 70–99)
Potassium: 3.9 mmol/L (ref 3.5–5.1)
Sodium: 137 mmol/L (ref 135–145)
Total Bilirubin: 0.9 mg/dL (ref 0.3–1.2)
Total Protein: 6.6 g/dL (ref 6.5–8.1)

## 2022-11-27 LAB — CBC
HCT: 48.2 % (ref 39.0–52.0)
Hemoglobin: 15.1 g/dL (ref 13.0–17.0)
MCH: 26.1 pg (ref 26.0–34.0)
MCHC: 31.3 g/dL (ref 30.0–36.0)
MCV: 83.4 fL (ref 80.0–100.0)
Platelets: 172 10*3/uL (ref 150–400)
RBC: 5.78 MIL/uL (ref 4.22–5.81)
RDW: 14 % (ref 11.5–15.5)
WBC: 10.8 10*3/uL — ABNORMAL HIGH (ref 4.0–10.5)
nRBC: 0 % (ref 0.0–0.2)

## 2022-11-27 LAB — URINALYSIS, ROUTINE W REFLEX MICROSCOPIC
Bacteria, UA: NONE SEEN
Bilirubin Urine: NEGATIVE
Glucose, UA: NEGATIVE mg/dL
Hgb urine dipstick: NEGATIVE
Ketones, ur: NEGATIVE mg/dL
Leukocytes,Ua: NEGATIVE
Nitrite: NEGATIVE
Protein, ur: >=300 mg/dL — AB
Specific Gravity, Urine: 1.017 (ref 1.005–1.030)
pH: 6 (ref 5.0–8.0)

## 2022-11-27 LAB — LIPASE, BLOOD: Lipase: 28 U/L (ref 11–51)

## 2022-11-27 MED ORDER — OXYCODONE-ACETAMINOPHEN 5-325 MG PO TABS
1.0000 | ORAL_TABLET | Freq: Once | ORAL | Status: AC
  Administered 2022-11-27: 1 via ORAL
  Filled 2022-11-27: qty 1

## 2022-11-27 MED ORDER — ACETAMINOPHEN 500 MG PO TABS: 1000.0000 mg | ORAL_TABLET | Freq: Once | ORAL | Status: DC

## 2022-11-27 MED ORDER — SODIUM CHLORIDE 0.9 % IV BOLUS
1000.0000 mL | Freq: Once | INTRAVENOUS | Status: AC
  Administered 2022-11-27: 1000 mL via INTRAVENOUS

## 2022-11-27 NOTE — ED Triage Notes (Addendum)
Pt arrived via [facility] c/o abdominal pain and diarrhea for the past day. Pt states he has had diarrhea 8-9 times in the past 24 hrs. While pt was with EMS He said he felt weak and his heart rate went down to 35 for 5 mins. Pt was pale.

## 2022-11-27 NOTE — ED Provider Notes (Signed)
MSE note.  Patient states that he has been having diarrhea profusely for the last 24 hours.  He states when he has the diarrhea becomes very dizzy.  Paramedics brought him in and said he had a pulse of 35 for a few minutes.  Patient presently not complaining of any pain.  He has had no blood in his diarrhea.  Physical exam patient alert and oriented x 4 lungs clear heart regular rate low rhythm abdomen soft nontender.  Labs ordered and IV fluids ordered   Bethann Berkshire, MD 11/27/22 [phone]

## 2022-11-27 NOTE — ED Provider Notes (Signed)
[place] EMERGENCY DEPARTMENT AT [facility] Provider Note   CSN: [id number] Arrival date & time: 11/27/22  4098     History  No chief complaint on file.   [name] [name] is a 67 y.o. male.  HPI    Patient is a 67 year old male with past medical history of HTN, prior GIST, and HLD, presenting today for diarrhea and abdominal pain.  He states it began yesterday with frequent bowel movements and mid abdominal pain.  His bowel movements then became more watery and he estimates 8-9 episodes overnight.  His pain is described as constant but does improve slightly after a bowel movement.  He denies any blood in the stool.  He has had no melena.  He denies any dysuria or hematuria.  He denies any known fevers.  He has been nauseated without vomiting.  He does report a prior history of GI bleeds related to a GIST tumor which is since been excised.  He is not on any blood thinners.  He also has had a cholecystectomy.  He denies any alcohol use. He does note a decreased appetite yesterday.   Home Medications Prior to Admission medications   Medication Sig Start Date End Date Taking? Authorizing Provider  cloNIDine (CATAPRES) 0.2 MG tablet Take 1 tablet (0.2 mg total) by mouth 2 (two) times daily. 11/21/22   [name], MD  hydrALAZINE (APRESOLINE) 50 MG tablet Take 1 tablet (50 mg total) by mouth 3 (three) times daily. 11/21/22   [name], MD  Iron, Ferrous Sulfate, 325 (65 Fe) MG TABS Take 325 mg by mouth 2 (two) times daily with a meal. Patient not taking: Reported on 10/31/2022 08/22/21   [name], MD  losartan (COZAAR) 100 MG tablet Take 1 tablet (100 mg total) by mouth daily. 11/20/22   [name], MD  rosuvastatin (CRESTOR) 40 MG tablet TAKE 1 TABLET BY MOUTH EVERY DAY 11/02/22   [name], MD  sildenafil (VIAGRA) 100 MG tablet Take 1 tablet (100 mg total) by mouth as needed for erectile dysfunction. 07/11/22   [name], MD  Syringe/Needle, Disp, (SYRINGE  3CC/22GX1-1/2") 22G X 1-1/2" 3 ML MISC 1 Application by Does not apply route every 14 (fourteen) days. 07/13/22   [name], MD  testosterone cypionate (DEPOTESTOSTERONE CYPIONATE) 200 MG/ML injection Inject 1 mL (200 mg total) into the muscle every 14 (fourteen) days. 08/29/22   [name], MD      Allergies    Lisinopril    Review of Systems   Review of Systems Negative except for as noted above in the HPI  Physical Exam Updated Vital Signs BP (!) 177/80   Pulse 66   Temp 97.9 F (36.6 C) (Oral)   Resp 17   Ht 5\' 11"  (1.803 m)   Wt 108 kg   SpO2 97%   BMI 33.21 kg/m  Physical Exam Vitals and nursing note reviewed.  Constitutional:      General: He is not in acute distress.    Appearance: He is well-developed.  HENT:     Head: Normocephalic and atraumatic.     Mouth/Throat:     Mouth: Mucous membranes are moist.  Eyes:     Conjunctiva/sclera: Conjunctivae normal.  Cardiovascular:     Rate and Rhythm: Normal rate and regular rhythm.     Heart sounds: No murmur heard. Pulmonary:     Effort: Pulmonary effort is normal. No respiratory distress.  Breath sounds: Normal breath sounds.     Comments: Saturating well on room air Abdominal:     General: There is no distension.     Palpations: Abdomen is soft.     Tenderness: There is abdominal tenderness (right lower quadrant tenderness to palpation). There is no right CVA tenderness, left CVA tenderness or guarding.  Musculoskeletal:        General: No swelling or tenderness.     Cervical back: Neck supple.  Skin:    General: Skin is warm and dry.     Capillary Refill: Capillary refill takes less than 2 seconds.  Neurological:     Mental Status: He is alert and oriented to person, place, and time.     Motor: No weakness.  Psychiatric:        Mood and Affect: Mood normal.     ED Results / Procedures / Treatments   Labs (all labs ordered are listed, but only abnormal results are displayed) Labs Reviewed   COMPREHENSIVE METABOLIC PANEL - Abnormal; Notable for the following components:      Result Value   Glucose, Bld 139 (*)    BUN 30 (*)    Creatinine, Ser 2.22 (*)    GFR, Estimated 31 (*)    All other components within normal limits  CBC - Abnormal; Notable for the following components:   WBC 10.8 (*)    All other components within normal limits  URINALYSIS, ROUTINE W REFLEX MICROSCOPIC - Abnormal; Notable for the following components:   Protein, ur >=300 (*)    All other components within normal limits  LIPASE, BLOOD    EKG None  Radiology CT ABDOMEN PELVIS WO CONTRAST  Result Date: 11/27/2022 CLINICAL DATA:  Acute upper abdominal pain. EXAM: CT ABDOMEN AND PELVIS WITHOUT CONTRAST TECHNIQUE: Multidetector CT imaging of the abdomen and pelvis was performed following the standard protocol without IV contrast. RADIATION DOSE REDUCTION: This exam was performed according to the departmental dose-optimization program which includes automated exposure control, adjustment of the mA and/or kV according to patient size and/or use of iterative reconstruction technique. COMPARISON:  December 03, 2007. FINDINGS: Lower chest: No acute abnormality. Hepatobiliary: No focal liver abnormality is seen. Status post cholecystectomy. No biliary dilatation. Pancreas: Unremarkable. No pancreatic ductal dilatation or surrounding inflammatory changes. Spleen: Normal in size without focal abnormality. Adrenals/Urinary Tract: Adrenal glands appear normal. Right renal atrophy is noted. Bilateral nonobstructive renal calculi are noted. Left renal cyst is noted for which no further follow-up is required. No hydronephrosis or renal obstruction is noted. Urinary bladder is unremarkable. Stomach/Bowel: Stomach is within normal limits. Appendix appears normal. No evidence of bowel wall thickening, distention, or inflammatory changes. Sigmoid diverticulosis. Vascular/Lymphatic: Aortic atherosclerosis. No enlarged abdominal or  pelvic lymph nodes. Reproductive: Prostate is unremarkable. Other: No hernia or ascites is noted. Musculoskeletal: No acute or significant osseous findings. IMPRESSION: Bilateral nonobstructive nephrolithiasis. Sigmoid diverticulosis without inflammation. Aortic Atherosclerosis (ICD10-I70.0). Electronically Signed   By: Lupita Raider M.D.   On: 11/27/2022 15:27      Medications Ordered in ED Medications  acetaminophen (TYLENOL) tablet 1,000 mg (has no administration in time range)  sodium chloride 0.9 % bolus 1,000 mL (0 mLs Intravenous Stopped 11/27/22 1143)  oxyCODONE-acetaminophen (PERCOCET/ROXICET) 5-325 MG per tablet 1 tablet (1 tablet Oral Given 11/27/22 1042)    ED Course/ Medical Decision Making/ A&P  Medical Decision Making Problems Addressed: Diarrhea, unspecified type: complicated acute illness or injury  Amount and/or Complexity of Data Reviewed External Data Reviewed: notes. Labs: ordered. Radiology: ordered and independent interpretation performed.  Risk OTC drugs. Prescription drug management.    Patient is a 67 year old male with past medical history of HTN, prior GIST, and HLD, presenting today for diarrhea and abdominal pain.  On exam, patient is alert and oriented.  He is in regular rate and rhythm.  Lungs are clear to auscultation.  He does have right lower quadrant tenderness palpation.  Presentation was consistent with gastroenteritis, colitis, possibly appendicitis given area of tenderness.  Also considered SBO with his history of prior surgeries.  Lower suspicion for GI bleed without any blood in the stool.  Lab work reveals creatinine of 2.2 (slightly increased from prior).  UA is unremarkable.  Hemoglobin is normal at 15.1.  He has a slight leukocytosis at 10.8.   Patient given Roxicodone for pain control.  CT A/P:  Bilateral nonobstructive nephrolithiasis. Sigmoid diverticulosis without inflammation. Aortic  Atherosclerosis   On reevaluation, patient is feeling somewhat better but does still have mild abdominal pain.  He denies any nausea or vomiting at this time.  He is tolerating p.o.'s without significant difficulty.  Suspect that his symptoms are likely related to gastroenteritis.  Advised to continue to push p.o. fluids at home.  Provided with strict return precautions.  He will follow-up closely with his primary care physician, specifically regarding his concerns regarding his blood pressure which has been elevated while in the ED.  He states understanding and agreement with plan for discharge at this time.   Final Clinical Impression(s) / ED Diagnoses Final diagnoses:  Diarrhea, unspecified type    Rx / DC Orders ED Discharge Orders     None         Rhys Martini, DO 11/27/22 1747    Maia Plan, MD 11/29/22 0006

## 2022-11-27 NOTE — Discharge Instructions (Addendum)
Drink plenty of fluids.  Follow-up with your primary care physician in the next 2 to 3 days.  Monitor for any signs of worsening including increased pain, blood in your stool, or inability to eat or drink.  Return to the ED for any worsening symptoms or further concerns.

## 2022-11-30 ENCOUNTER — Other Ambulatory Visit: Payer: Self-pay | Admitting: Family Medicine

## 2022-12-04 ENCOUNTER — Encounter: Payer: Self-pay | Admitting: Family Medicine

## 2022-12-04 ENCOUNTER — Ambulatory Visit (INDEPENDENT_AMBULATORY_CARE_PROVIDER_SITE_OTHER): Payer: Medicare Other | Admitting: Family Medicine

## 2022-12-04 VITALS — BP 110/70 | HR 61 | Temp 97.6°F | Wt 228.0 lb

## 2022-12-04 DIAGNOSIS — R1010 Upper abdominal pain, unspecified: Secondary | ICD-10-CM

## 2022-12-04 DIAGNOSIS — I701 Atherosclerosis of renal artery: Secondary | ICD-10-CM | POA: Diagnosis not present

## 2022-12-04 DIAGNOSIS — K551 Chronic vascular disorders of intestine: Secondary | ICD-10-CM

## 2022-12-04 NOTE — Progress Notes (Signed)
   Subjective:    Patient ID: [name] [name], male    DOB: 1955/12/20, 67 y.o.   MRN: [id number]  HPI Here to follow up on two recent ED visits for abdominal pain and diarrhea. He went to the [facility] ED on 11-27-22 for pain and loose stools that has started a few days prior to that. No fever or nausea. His labs were normal, including a WBC of 10.8. An abdominal CT was clear. He was felt to have a viral enteritis, and he was given IV fluids and sent home. His symptoms continued however, and he was seen at a [facility] ED in [place] on 11-30-22. Again his labs were normal, with a WBC of 8.1. He was felt to have either gastritis or an ulcer, and he was started on Pantoprazole 40 mg daily with Sulcralfate QID. Since then he has felt much better, and he is now having normal stools. He is trying to eat a bland diet. He asks about seeing a GI specialist. In fact, the [facility] ED referred him to see Dr. Mosetta Putt in [place], but [name] has not heard from that office yet.    Review of Systems  Constitutional: Negative.   Respiratory: Negative.    Cardiovascular: Negative.   Gastrointestinal:  Positive for abdominal pain and diarrhea. Negative for abdominal distention, anal bleeding, blood in stool, constipation, nausea and vomiting.       Objective:   Physical Exam Constitutional:      Appearance: Normal appearance. He is not ill-appearing.  Cardiovascular:     Rate and Rhythm: Normal rate and regular rhythm.     Pulses: Normal pulses.     Heart sounds: Normal heart sounds.  Pulmonary:     Effort: Pulmonary effort is normal.     Breath sounds: Normal breath sounds.  Abdominal:     General: Abdomen is flat. Bowel sounds are normal. There is no distension.     Palpations: Abdomen is soft. There is no mass.     Tenderness: There is no abdominal tenderness. There is no guarding or rebound.     Hernia: No hernia is present.  Neurological:     Mental Status: He is alert.           Assessment &  Plan:  Upper abdominal pain and diarrhea, likely due to either gastritis or PUD. He will stay on Pantoprazole and Sucralfate. I asked him to contact the office of Dr. Lance Morin to get an appt. I suspect he will need an EGD. We spent a total of (34   ) minutes reviewing records and discussing these issues.  Gershon Crane, MD

## 2022-12-05 ENCOUNTER — Ambulatory Visit (HOSPITAL_COMMUNITY)
Admission: RE | Admit: 2022-12-05 | Discharge: 2022-12-05 | Disposition: A | Discharge status: Home / Self Care | Payer: Medicare Other | Source: Ambulatory Visit | Attending: Cardiovascular Disease | Admitting: Cardiovascular Disease

## 2022-12-05 DIAGNOSIS — I10 Essential (primary) hypertension: Secondary | ICD-10-CM | POA: Diagnosis not present

## 2022-12-05 NOTE — Addendum Note (Signed)
Addended by: Gershon Crane A on: 12/05/2022 05:16 PM   Modules accepted: Orders

## 2022-12-06 ENCOUNTER — Telehealth: Payer: Self-pay | Admitting: Family Medicine

## 2022-12-06 ENCOUNTER — Encounter: Payer: Self-pay | Admitting: *Deleted

## 2022-12-06 MED ORDER — HYDROCODONE-ACETAMINOPHEN 10-325 MG PO TABS: 1.0000 | ORAL_TABLET | Freq: Four times a day (QID) | ORAL | 0 refills | Status: DC | PRN

## 2022-12-06 NOTE — Telephone Encounter (Signed)
Pt is requesting a refill on his Hydrocodone.  He states he is in pain.  The hospital prescribe this to him.  He is going out of town and needs it to get through his trip.  He states he just saw Dr. Clent Ridges.  He is wanting [place] to call him back.

## 2022-12-06 NOTE — Telephone Encounter (Signed)
I sent in some more Hydrocodone pills

## 2022-12-07 NOTE — Telephone Encounter (Signed)
Tell him to get on the cancellation list

## 2022-12-08 NOTE — Telephone Encounter (Signed)
Rx sent 

## 2022-12-12 ENCOUNTER — Telehealth: Payer: Self-pay

## 2022-12-12 NOTE — Telephone Encounter (Signed)
Dr. Glenna Fellows with [facility] called stating that the pt had a renal US and an incidental finding of SMA occlusion. He is having increased HTN, creatinine, and stomach pain. She is requesting his appt be moved earlier. Thorough search provided no earlier appts, but assured her that he was placed on high priority wait list. Confirmed understanding.  Spoke to Dr. Chestine Spore to review renal US results to confirm if pt needed to be seen earlier. He stated if the pt was asymptomatic, the pt could wait.   Dr. Glenna Fellows called again stating that she thought it was an incidental finding, but the pt has lost 20 lbs and been to the ED x 2 for his symptoms. Pt is currently out of town, but will be back next week. No change in appts at this time d/t availability, but will monitor for cancellations and wait list.

## 2022-12-20 ENCOUNTER — Other Ambulatory Visit: Payer: Self-pay

## 2022-12-20 ENCOUNTER — Encounter (HOSPITAL_COMMUNITY): Payer: Self-pay | Admitting: Emergency Medicine

## 2022-12-20 ENCOUNTER — Ambulatory Visit (INDEPENDENT_AMBULATORY_CARE_PROVIDER_SITE_OTHER): Payer: Medicare Other | Admitting: Vascular Surgery

## 2022-12-20 ENCOUNTER — Encounter: Payer: Self-pay | Admitting: Vascular Surgery

## 2022-12-20 ENCOUNTER — Emergency Department (HOSPITAL_COMMUNITY): Payer: Medicare Other

## 2022-12-20 ENCOUNTER — Inpatient Hospital Stay (HOSPITAL_COMMUNITY)
Admission: EM | Admit: 2022-12-20 | Discharge: 2023-01-24 | DRG: 329 | Disposition: A | Discharge status: Rehabilitation Facility | Payer: Medicare Other | Attending: Family Medicine | Admitting: Family Medicine

## 2022-12-20 VITALS — BP 119/69 | HR 92 | Temp 98.3°F | Resp 20 | Ht 71.0 in | Wt 211.0 lb

## 2022-12-20 DIAGNOSIS — K2990 Gastroduodenitis, unspecified, without bleeding: Secondary | ICD-10-CM | POA: Diagnosis present

## 2022-12-20 DIAGNOSIS — K551 Chronic vascular disorders of intestine: Secondary | ICD-10-CM | POA: Diagnosis present

## 2022-12-20 DIAGNOSIS — I824Y3 Acute embolism and thrombosis of unspecified deep veins of proximal lower extremity, bilateral: Secondary | ICD-10-CM | POA: Diagnosis not present

## 2022-12-20 DIAGNOSIS — R7401 Elevation of levels of liver transaminase levels: Secondary | ICD-10-CM | POA: Diagnosis not present

## 2022-12-20 DIAGNOSIS — J95821 Acute postprocedural respiratory failure: Secondary | ICD-10-CM | POA: Diagnosis not present

## 2022-12-20 DIAGNOSIS — Z992 Dependence on renal dialysis: Secondary | ICD-10-CM

## 2022-12-20 DIAGNOSIS — I700 Atherosclerosis of aorta: Secondary | ICD-10-CM | POA: Diagnosis present

## 2022-12-20 DIAGNOSIS — Z85831 Personal history of malignant neoplasm of soft tissue: Secondary | ICD-10-CM

## 2022-12-20 DIAGNOSIS — K5791 Diverticulosis of intestine, part unspecified, without perforation or abscess with bleeding: Secondary | ICD-10-CM | POA: Diagnosis not present

## 2022-12-20 DIAGNOSIS — Z539 Procedure and treatment not carried out, unspecified reason: Secondary | ICD-10-CM | POA: Diagnosis not present

## 2022-12-20 DIAGNOSIS — E876 Hypokalemia: Secondary | ICD-10-CM | POA: Diagnosis not present

## 2022-12-20 DIAGNOSIS — I120 Hypertensive chronic kidney disease with stage 5 chronic kidney disease or end stage renal disease: Secondary | ICD-10-CM | POA: Diagnosis present

## 2022-12-20 DIAGNOSIS — H1031 Unspecified acute conjunctivitis, right eye: Secondary | ICD-10-CM | POA: Diagnosis not present

## 2022-12-20 DIAGNOSIS — R000 Tachycardia, unspecified: Secondary | ICD-10-CM | POA: Diagnosis not present

## 2022-12-20 DIAGNOSIS — D62 Acute posthemorrhagic anemia: Secondary | ICD-10-CM | POA: Diagnosis not present

## 2022-12-20 DIAGNOSIS — E43 Unspecified severe protein-calorie malnutrition: Secondary | ICD-10-CM | POA: Diagnosis present

## 2022-12-20 DIAGNOSIS — K921 Melena: Secondary | ICD-10-CM

## 2022-12-20 DIAGNOSIS — K9189 Other postprocedural complications and disorders of digestive system: Secondary | ICD-10-CM | POA: Diagnosis not present

## 2022-12-20 DIAGNOSIS — E872 Acidosis, unspecified: Secondary | ICD-10-CM | POA: Diagnosis not present

## 2022-12-20 DIAGNOSIS — N1831 Chronic kidney disease, stage 3a: Secondary | ICD-10-CM | POA: Diagnosis not present

## 2022-12-20 DIAGNOSIS — R197 Diarrhea, unspecified: Secondary | ICD-10-CM | POA: Diagnosis not present

## 2022-12-20 DIAGNOSIS — N170 Acute kidney failure with tubular necrosis: Secondary | ICD-10-CM | POA: Diagnosis not present

## 2022-12-20 DIAGNOSIS — Z6832 Body mass index (BMI) 32.0-32.9, adult: Secondary | ICD-10-CM

## 2022-12-20 DIAGNOSIS — I82431 Acute embolism and thrombosis of right popliteal vein: Secondary | ICD-10-CM | POA: Diagnosis present

## 2022-12-20 DIAGNOSIS — G9341 Metabolic encephalopathy: Secondary | ICD-10-CM | POA: Diagnosis not present

## 2022-12-20 DIAGNOSIS — K567 Ileus, unspecified: Secondary | ICD-10-CM | POA: Diagnosis not present

## 2022-12-20 DIAGNOSIS — R7989 Other specified abnormal findings of blood chemistry: Secondary | ICD-10-CM | POA: Diagnosis not present

## 2022-12-20 DIAGNOSIS — E8809 Other disorders of plasma-protein metabolism, not elsewhere classified: Secondary | ICD-10-CM | POA: Diagnosis present

## 2022-12-20 DIAGNOSIS — M7989 Other specified soft tissue disorders: Secondary | ICD-10-CM | POA: Diagnosis not present

## 2022-12-20 DIAGNOSIS — L89316 Pressure-induced deep tissue damage of right buttock: Secondary | ICD-10-CM | POA: Diagnosis present

## 2022-12-20 DIAGNOSIS — N186 End stage renal disease: Secondary | ICD-10-CM | POA: Diagnosis present

## 2022-12-20 DIAGNOSIS — K55029 Acute infarction of small intestine, extent unspecified: Principal | ICD-10-CM | POA: Diagnosis present

## 2022-12-20 DIAGNOSIS — Z9911 Dependence on respirator [ventilator] status: Secondary | ICD-10-CM | POA: Diagnosis not present

## 2022-12-20 DIAGNOSIS — K649 Unspecified hemorrhoids: Secondary | ICD-10-CM | POA: Diagnosis not present

## 2022-12-20 DIAGNOSIS — N179 Acute kidney failure, unspecified: Secondary | ICD-10-CM | POA: Insufficient documentation

## 2022-12-20 DIAGNOSIS — K55059 Acute (reversible) ischemia of intestine, part and extent unspecified: Secondary | ICD-10-CM | POA: Diagnosis present

## 2022-12-20 DIAGNOSIS — K559 Vascular disorder of intestine, unspecified: Secondary | ICD-10-CM | POA: Diagnosis present

## 2022-12-20 DIAGNOSIS — I82452 Acute embolism and thrombosis of left peroneal vein: Secondary | ICD-10-CM | POA: Diagnosis present

## 2022-12-20 DIAGNOSIS — Z8547 Personal history of malignant neoplasm of testis: Secondary | ICD-10-CM

## 2022-12-20 DIAGNOSIS — Z888 Allergy status to other drugs, medicaments and biological substances status: Secondary | ICD-10-CM

## 2022-12-20 DIAGNOSIS — K626 Ulcer of anus and rectum: Secondary | ICD-10-CM | POA: Diagnosis not present

## 2022-12-20 DIAGNOSIS — E860 Dehydration: Secondary | ICD-10-CM | POA: Diagnosis not present

## 2022-12-20 DIAGNOSIS — E870 Hyperosmolality and hypernatremia: Secondary | ICD-10-CM | POA: Diagnosis present

## 2022-12-20 DIAGNOSIS — R5381 Other malaise: Secondary | ICD-10-CM

## 2022-12-20 DIAGNOSIS — K633 Ulcer of intestine: Secondary | ICD-10-CM | POA: Diagnosis present

## 2022-12-20 DIAGNOSIS — K565 Intestinal adhesions [bands], unspecified as to partial versus complete obstruction: Secondary | ICD-10-CM | POA: Diagnosis present

## 2022-12-20 DIAGNOSIS — I4891 Unspecified atrial fibrillation: Secondary | ICD-10-CM | POA: Diagnosis not present

## 2022-12-20 DIAGNOSIS — K5730 Diverticulosis of large intestine without perforation or abscess without bleeding: Secondary | ICD-10-CM | POA: Diagnosis present

## 2022-12-20 DIAGNOSIS — Z6829 Body mass index (BMI) 29.0-29.9, adult: Secondary | ICD-10-CM

## 2022-12-20 DIAGNOSIS — K56609 Unspecified intestinal obstruction, unspecified as to partial versus complete obstruction: Secondary | ICD-10-CM | POA: Diagnosis not present

## 2022-12-20 DIAGNOSIS — K625 Hemorrhage of anus and rectum: Secondary | ICD-10-CM | POA: Diagnosis not present

## 2022-12-20 DIAGNOSIS — L089 Local infection of the skin and subcutaneous tissue, unspecified: Secondary | ICD-10-CM | POA: Diagnosis not present

## 2022-12-20 DIAGNOSIS — K922 Gastrointestinal hemorrhage, unspecified: Secondary | ICD-10-CM | POA: Diagnosis not present

## 2022-12-20 DIAGNOSIS — R339 Retention of urine, unspecified: Secondary | ICD-10-CM | POA: Diagnosis not present

## 2022-12-20 DIAGNOSIS — E871 Hypo-osmolality and hyponatremia: Secondary | ICD-10-CM | POA: Diagnosis not present

## 2022-12-20 DIAGNOSIS — I82499 Acute embolism and thrombosis of other specified deep vein of unspecified lower extremity: Secondary | ICD-10-CM | POA: Diagnosis not present

## 2022-12-20 DIAGNOSIS — L89326 Pressure-induced deep tissue damage of left buttock: Secondary | ICD-10-CM | POA: Diagnosis present

## 2022-12-20 DIAGNOSIS — R739 Hyperglycemia, unspecified: Secondary | ICD-10-CM | POA: Diagnosis not present

## 2022-12-20 DIAGNOSIS — K254 Chronic or unspecified gastric ulcer with hemorrhage: Secondary | ICD-10-CM | POA: Diagnosis not present

## 2022-12-20 DIAGNOSIS — N1832 Chronic kidney disease, stage 3b: Secondary | ICD-10-CM | POA: Diagnosis present

## 2022-12-20 DIAGNOSIS — K120 Recurrent oral aphthae: Secondary | ICD-10-CM | POA: Diagnosis present

## 2022-12-20 DIAGNOSIS — H5789 Other specified disorders of eye and adnexa: Secondary | ICD-10-CM | POA: Diagnosis not present

## 2022-12-20 DIAGNOSIS — I129 Hypertensive chronic kidney disease with stage 1 through stage 4 chronic kidney disease, or unspecified chronic kidney disease: Secondary | ICD-10-CM | POA: Diagnosis not present

## 2022-12-20 DIAGNOSIS — I10 Essential (primary) hypertension: Secondary | ICD-10-CM | POA: Diagnosis present

## 2022-12-20 DIAGNOSIS — L89153 Pressure ulcer of sacral region, stage 3: Secondary | ICD-10-CM | POA: Diagnosis not present

## 2022-12-20 DIAGNOSIS — I951 Orthostatic hypotension: Secondary | ICD-10-CM | POA: Diagnosis not present

## 2022-12-20 DIAGNOSIS — Z87442 Personal history of urinary calculi: Secondary | ICD-10-CM

## 2022-12-20 DIAGNOSIS — K55069 Acute infarction of intestine, part and extent unspecified: Secondary | ICD-10-CM | POA: Diagnosis present

## 2022-12-20 DIAGNOSIS — E785 Hyperlipidemia, unspecified: Secondary | ICD-10-CM | POA: Diagnosis not present

## 2022-12-20 DIAGNOSIS — Z79899 Other long term (current) drug therapy: Secondary | ICD-10-CM

## 2022-12-20 DIAGNOSIS — R21 Rash and other nonspecific skin eruption: Secondary | ICD-10-CM | POA: Diagnosis present

## 2022-12-20 DIAGNOSIS — K449 Diaphragmatic hernia without obstruction or gangrene: Secondary | ICD-10-CM | POA: Diagnosis present

## 2022-12-20 DIAGNOSIS — Z7982 Long term (current) use of aspirin: Secondary | ICD-10-CM

## 2022-12-20 DIAGNOSIS — K219 Gastro-esophageal reflux disease without esophagitis: Secondary | ICD-10-CM | POA: Diagnosis present

## 2022-12-20 DIAGNOSIS — Z860100 Personal history of colon polyps, unspecified: Secondary | ICD-10-CM

## 2022-12-20 DIAGNOSIS — N189 Chronic kidney disease, unspecified: Secondary | ICD-10-CM | POA: Diagnosis not present

## 2022-12-20 DIAGNOSIS — Z8249 Family history of ischemic heart disease and other diseases of the circulatory system: Secondary | ICD-10-CM

## 2022-12-20 DIAGNOSIS — I739 Peripheral vascular disease, unspecified: Secondary | ICD-10-CM | POA: Diagnosis present

## 2022-12-20 DIAGNOSIS — Z820 Family history of epilepsy and other diseases of the nervous system: Secondary | ICD-10-CM

## 2022-12-20 DIAGNOSIS — S31109A Unspecified open wound of abdominal wall, unspecified quadrant without penetration into peritoneal cavity, initial encounter: Secondary | ICD-10-CM | POA: Diagnosis not present

## 2022-12-20 DIAGNOSIS — E669 Obesity, unspecified: Secondary | ICD-10-CM | POA: Diagnosis present

## 2022-12-20 DIAGNOSIS — R109 Unspecified abdominal pain: Secondary | ICD-10-CM | POA: Diagnosis not present

## 2022-12-20 DIAGNOSIS — K91841 Postprocedural hemorrhage and hematoma of a digestive system organ or structure following other procedure: Secondary | ICD-10-CM | POA: Diagnosis not present

## 2022-12-20 DIAGNOSIS — I480 Paroxysmal atrial fibrillation: Secondary | ICD-10-CM | POA: Diagnosis not present

## 2022-12-20 DIAGNOSIS — R001 Bradycardia, unspecified: Secondary | ICD-10-CM | POA: Diagnosis not present

## 2022-12-20 DIAGNOSIS — H3321 Serous retinal detachment, right eye: Secondary | ICD-10-CM | POA: Diagnosis present

## 2022-12-20 DIAGNOSIS — D72829 Elevated white blood cell count, unspecified: Secondary | ICD-10-CM | POA: Diagnosis present

## 2022-12-20 DIAGNOSIS — Z978 Presence of other specified devices: Secondary | ICD-10-CM | POA: Diagnosis not present

## 2022-12-20 DIAGNOSIS — K3189 Other diseases of stomach and duodenum: Secondary | ICD-10-CM | POA: Diagnosis present

## 2022-12-20 DIAGNOSIS — Z6831 Body mass index (BMI) 31.0-31.9, adult: Secondary | ICD-10-CM | POA: Diagnosis not present

## 2022-12-20 DIAGNOSIS — Z7901 Long term (current) use of anticoagulants: Secondary | ICD-10-CM

## 2022-12-20 DIAGNOSIS — I701 Atherosclerosis of renal artery: Secondary | ICD-10-CM | POA: Diagnosis not present

## 2022-12-20 DIAGNOSIS — J9600 Acute respiratory failure, unspecified whether with hypoxia or hypercapnia: Secondary | ICD-10-CM

## 2022-12-20 DIAGNOSIS — I259 Chronic ischemic heart disease, unspecified: Secondary | ICD-10-CM | POA: Diagnosis not present

## 2022-12-20 DIAGNOSIS — R578 Other shock: Secondary | ICD-10-CM | POA: Diagnosis not present

## 2022-12-20 LAB — COMPREHENSIVE METABOLIC PANEL
ALT: 105 U/L — ABNORMAL HIGH (ref 0–44)
AST: 74 U/L — ABNORMAL HIGH (ref 15–41)
Albumin: 3.1 g/dL — ABNORMAL LOW (ref 3.5–5.0)
Alkaline Phosphatase: 67 U/L (ref 38–126)
Anion gap: 10 (ref 5–15)
BUN: 36 mg/dL — ABNORMAL HIGH (ref 8–23)
CO2: 29 mmol/L (ref 22–32)
Calcium: 8.6 mg/dL — ABNORMAL LOW (ref 8.9–10.3)
Chloride: 99 mmol/L (ref 98–111)
Creatinine, Ser: 3 mg/dL — ABNORMAL HIGH (ref 0.61–1.24)
GFR, Estimated: 22 mL/min — ABNORMAL LOW (ref >60)
Glucose, Bld: 126 mg/dL — ABNORMAL HIGH (ref 70–99)
Potassium: 3.8 mmol/L (ref 3.5–5.1)
Sodium: 138 mmol/L (ref 135–145)
Total Bilirubin: 1.2 mg/dL (ref 0.3–1.2)
Total Protein: 6.8 g/dL (ref 6.5–8.1)

## 2022-12-20 LAB — CBC
HCT: 41.8 % (ref 39.0–52.0)
HCT: 42 % (ref 39.0–52.0)
Hemoglobin: 13.4 g/dL (ref 13.0–17.0)
Hemoglobin: 13.4 g/dL (ref 13.0–17.0)
MCH: 25.1 pg — ABNORMAL LOW (ref 26.0–34.0)
MCH: 25.9 pg — ABNORMAL LOW (ref 26.0–34.0)
MCHC: 32.1 g/dL (ref 30.0–36.0)
MCHC: 31.9 g/dL (ref 30.0–36.0)
MCV: 78.4 fL — ABNORMAL LOW (ref 80.0–100.0)
MCV: 81.2 fL (ref 80.0–100.0)
Platelets: 282 10*3/uL (ref 150–400)
Platelets: 207 10*3/uL (ref 150–400)
RBC: 5.33 MIL/uL (ref 4.22–5.81)
RBC: 5.17 MIL/uL (ref 4.22–5.81)
RDW: 13.4 % (ref 11.5–15.5)
RDW: 13.6 % (ref 11.5–15.5)
WBC: 12.8 10*3/uL — ABNORMAL HIGH (ref 4.0–10.5)
WBC: 13.6 10*3/uL — ABNORMAL HIGH (ref 4.0–10.5)
nRBC: 0 % (ref 0.0–0.2)
nRBC: 0 % (ref 0.0–0.2)

## 2022-12-20 LAB — TSH: TSH: 0.789 u[IU]/mL (ref 0.350–4.500)

## 2022-12-20 LAB — HIV ANTIBODY (ROUTINE TESTING W REFLEX): HIV Screen 4th Generation wRfx: NONREACTIVE

## 2022-12-20 LAB — HEPARIN LEVEL (UNFRACTIONATED): Heparin Unfractionated: <0.1 [IU]/mL — ABNORMAL LOW (ref 0.30–0.70)

## 2022-12-20 MED ORDER — ROSUVASTATIN CALCIUM 20 MG PO TABS
40.0000 mg | ORAL_TABLET | Freq: Every day | ORAL | Status: DC
  Administered 2022-12-21: 40 mg via ORAL
  Filled 2022-12-20: qty 2

## 2022-12-20 MED ORDER — MORPHINE SULFATE (PF) 2 MG/ML IV SOLN
2.0000 mg | Freq: Once | INTRAVENOUS | Status: AC
  Administered 2022-12-20: 2 mg via INTRAVENOUS
  Filled 2022-12-20: qty 1

## 2022-12-20 MED ORDER — SODIUM CHLORIDE 0.9% FLUSH
3.0000 mL | Freq: Two times a day (BID) | INTRAVENOUS | Status: DC
  Administered 2022-12-20 – 2023-01-02 (×24): 3 mL via INTRAVENOUS
  Administered 2023-01-03: 15 mL via INTRAVENOUS
  Administered 2023-01-03 – 2023-01-24 (×39): 3 mL via INTRAVENOUS

## 2022-12-20 MED ORDER — CLONIDINE HCL 0.1 MG PO TABS
0.2000 mg | ORAL_TABLET | Freq: Two times a day (BID) | ORAL | Status: DC
  Administered 2022-12-20 – 2022-12-21 (×2): 0.2 mg via ORAL
  Filled 2022-12-20 (×2): qty 1

## 2022-12-20 MED ORDER — MORPHINE SULFATE (PF) 2 MG/ML IV SOLN
2.0000 mg | INTRAVENOUS | Status: DC | PRN
  Administered 2022-12-20 – 2022-12-21 (×4): 2 mg via INTRAVENOUS
  Filled 2022-12-20 (×4): qty 1

## 2022-12-20 MED ORDER — SODIUM CHLORIDE 0.9 % IV SOLN: INTRAVENOUS | Status: DC

## 2022-12-20 MED ORDER — PANTOPRAZOLE SODIUM 40 MG PO TBEC
40.0000 mg | DELAYED_RELEASE_TABLET | Freq: Every day | ORAL | Status: DC
  Administered 2022-12-21: 40 mg via ORAL
  Filled 2022-12-20: qty 1

## 2022-12-20 MED ORDER — LACTATED RINGERS IV BOLUS
500.0000 mL | Freq: Once | INTRAVENOUS | Status: AC
  Administered 2022-12-20: 500 mL via INTRAVENOUS

## 2022-12-20 MED ORDER — ACETAMINOPHEN 325 MG PO TABS: 650.0000 mg | ORAL_TABLET | Freq: Four times a day (QID) | ORAL | Status: DC | PRN

## 2022-12-20 MED ORDER — HEPARIN (PORCINE) 25000 UT/250ML-% IV SOLN
2200.0000 [IU]/h | INTRAVENOUS | Status: DC
  Administered 2022-12-20: 1500 [IU]/h via INTRAVENOUS
  Administered 2022-12-21: 2000 [IU]/h via INTRAVENOUS
  Administered 2022-12-21: 1750 [IU]/h via INTRAVENOUS
  Filled 2022-12-20 (×3): qty 250

## 2022-12-20 MED ORDER — ENSURE ENLIVE PO LIQD
237.0000 mL | Freq: Two times a day (BID) | ORAL | Status: DC
  Administered 2022-12-20: 237 mL via ORAL
  Filled 2022-12-20: qty 237

## 2022-12-20 MED ORDER — ACETAMINOPHEN 650 MG RE SUPP: 650.0000 mg | Freq: Four times a day (QID) | RECTAL | Status: DC | PRN

## 2022-12-20 MED ORDER — HEPARIN BOLUS VIA INFUSION
6000.0000 [IU] | Freq: Once | INTRAVENOUS | Status: AC
  Administered 2022-12-20: 6000 [IU] via INTRAVENOUS
  Filled 2022-12-20: qty 6000

## 2022-12-20 NOTE — Progress Notes (Signed)
Patient ID: [name] [name], male   DOB: 09-28-1955, 67 y.o.   MRN: [id number]  Reason for Consult: No chief complaint on file.   Referred by [name], MD  Subjective:     HPI:  [name] [name] is a 67 y.o. male with mesenteric artery stenosis in the past had undergone SMA angiogram when he had a gastrointestinal stromal tumor which was ultimately removed and 2023.  More recently has had abdominal pain evaluated emergency department at both [facility] and at [facility] in [place] and had noncontrasted CT scans due to elevated creatinine that he states is secondary to [facility].  Over the past few weeks he has now lost 30 pounds and states that he cannot really think of  eating or drinking anything.  Patient has not vomited in over 25 years states that he more has a vagal reflex.  He continues to have bowel movements without blood.  He states that his abdominal pain is 10 out of 10 today on exam.  Past Medical History:  Diagnosis Date   Ankle fracture    Cancer (HCC) 1974   testicular cancer-at age 79   Cataract    bilateral sx   GERD (gastroesophageal reflux disease)    hx of   Hx of colonic polyps    pt unsure,thinks this was done in 1982 in [place]. no reports in EPIC   Hyperlipidemia    on meds   Hypertension    on meds   Kidney stones    Patella fracture    Retinal detachment    OD   Retinal tear of right eye    Syncopal episodes 2008   Family History  Problem Relation Age of Onset   Parkinson's disease Mother    Arrhythmia Father    Coronary artery disease Other        fhx   Hypertension Other        fhx   Sudden death Other        fhx   Colon cancer Neg Hx    Colon polyps Neg Hx    Esophageal cancer Neg Hx    Rectal cancer Neg Hx    Stomach cancer Neg Hx    Past Surgical History:  Procedure Laterality Date   AIR/FLUID EXCHANGE Right 06/16/2014   Procedure: AIR/FLUID EXCHANGE;  Surgeon: Sherrie George, MD;  Location: [facility] OR;  Service: Ophthalmology;  Laterality:  Right;   BIOPSY  04/04/2021   Procedure: BIOPSY;  Surgeon: Meridee Score Netty Starring., MD;  Location: [facility] ENDOSCOPY;  Service: Gastroenterology;;   CATARACT EXTRACTION     CATARACT EXTRACTION  09/2014   rt eye//left 01/2015   CATARACT EXTRACTION, BILATERAL  2016   per Dr. Elmer Picker    COLONOSCOPY  02/16/2020   per Dr. Rhea Belton, adenomatous polyps, repeat in 3 yrs   ENTEROSCOPY N/A 04/04/2021   Procedure: ENTEROSCOPY;  Surgeon: Mansouraty, Netty Starring., MD;  Location: [facility] ENDOSCOPY;  Service: Gastroenterology;  Laterality: N/A;   EYE SURGERY     GAS INSERTION Right 06/16/2014   Procedure: INSERTION OF GAS;  Surgeon: Sherrie George, MD;  Location: [facility] OR;  Service: Ophthalmology;  Laterality: Right;  [id number]   GIVENS CAPSULE STUDY N/A 04/02/2021   Procedure: GIVENS CAPSULE STUDY;  Surgeon: Lemar Lofty., MD;  Location: [facility] ENDOSCOPY;  Service: Gastroenterology;  Laterality: N/A;   HYDROCELE EXCISION     IR ANGIOGRAM VISCERAL SELECTIVE  04/04/2021   IR US GUIDE VASC ACCESS LEFT  04/04/2021  KNEE ARTHROSCOPY Right    x 2   LASER PHOTO ABLATION Right 06/16/2014   Procedure: LASER PHOTO ABLATION;  Surgeon: Sherrie George, MD;  Location: [facility] OR;  Service: Ophthalmology;  Laterality: Right;  Headscope laser and endolaser    ORCHIECTOMY     right testicle   REPAIR OF COMPLEX TRACTION RETINAL DETACHMENT Right 06/16/2014   Procedure: REPAIR OF COMPLEX TRACTION RETINAL DETACHMENT;  Surgeon: Sherrie George, MD;  Location: [facility] OR;  Service: Ophthalmology;  Laterality: Right;   RETINAL DETACHMENT SURGERY  06/2014   SUBMUCOSAL TATTOO INJECTION  04/04/2021   Procedure: SUBMUCOSAL TATTOO INJECTION;  Surgeon: Lemar Lofty., MD;  Location: [facility] ENDOSCOPY;  Service: Gastroenterology;;   TONSILLECTOMY     VITRECTOMY Right 06/16/2014   WISDOM TOOTH EXTRACTION      Short Social History:  Social History   Tobacco Use   Smoking status: Never   Smokeless tobacco: Never  Substance Use Topics    Alcohol use: Not Currently    Comment: 2 beers monthly    Allergies  Allergen Reactions   Lisinopril Cough    Current Outpatient Medications  Medication Sig Dispense Refill   chlorthalidone (HYGROTON) 25 MG tablet Take 12.5 mg by mouth every morning.     cloNIDine (CATAPRES) 0.2 MG tablet Take 1 tablet (0.2 mg total) by mouth 2 (two) times daily. 60 tablet 2   hydrALAZINE (APRESOLINE) 50 MG tablet Take 1 tablet (50 mg total) by mouth 3 (three) times daily. 90 tablet 2   HYDROcodone-acetaminophen (NORCO) 10-325 MG tablet Take 1 tablet by mouth every 6 (six) hours as needed for moderate pain. 20 tablet 0   Iron, Ferrous Sulfate, 325 (65 Fe) MG TABS Take 325 mg by mouth 2 (two) times daily with a meal. 60 tablet 5   losartan (COZAAR) 100 MG tablet Take 1 tablet (100 mg total) by mouth daily. 90 tablet 0   pantoprazole (PROTONIX) 40 MG tablet Take 40 mg by mouth daily.     rosuvastatin (CRESTOR) 40 MG tablet TAKE 1 TABLET BY MOUTH EVERY DAY 90 tablet 0   sildenafil (VIAGRA) 100 MG tablet Take 1 tablet (100 mg total) by mouth as needed for erectile dysfunction. 10 tablet 5   sucralfate (CARAFATE) 1 g tablet Take 1 g by mouth 4 (four) times daily.     Syringe/Needle, Disp, (SYRINGE 3CC/22GX1-1/2") 22G X 1-1/2" 3 ML MISC 1 Application by Does not apply route every 14 (fourteen) days. 50 each 0   testosterone cypionate (DEPOTESTOSTERONE CYPIONATE) 200 MG/ML injection Inject 1 mL (200 mg total) into the muscle every 14 (fourteen) days. 10 mL 1   No current facility-administered medications for this visit.    Review of Systems  Constitutional: Positive for fatigue.  HENT: HENT negative.  Eyes: Eyes negative.  Respiratory: Respiratory negative.  Cardiovascular: Cardiovascular negative.  GI: Positive for abdominal pain and nausea. Negative for blood in stool.  Musculoskeletal: Musculoskeletal negative.  Skin: Skin negative.  Neurological: Neurological negative. Hematologic:  Hematologic/lymphatic negative.  Psychiatric: Psychiatric negative. Negative for depressed mood.        Objective:  Objective  Vitals:   12/20/22 1109  BP: 119/69  Pulse: 92  Resp: 20  Temp: 98.3 F (36.8 C)  SpO2: 95%     Physical Exam HENT:     Head: Normocephalic.     Nose: Nose normal.     Mouth/Throat:     Mouth: Mucous membranes are moist.  Eyes:     Pupils:  Pupils are equal, round, and reactive to light.  Cardiovascular:     Rate and Rhythm: Normal rate.  Abdominal:     General: There is distension.     Tenderness: There is abdominal tenderness. There is guarding.  Musculoskeletal:        General: Normal range of motion.     Cervical back: Normal range of motion.  Skin:    General: Skin is warm.     Capillary Refill: Capillary refill takes less than 2 seconds.  Neurological:     General: No focal deficit present.     Mental Status: He is alert.  Psychiatric:        Mood and Affect: Mood normal.        Thought Content: Thought content normal.        Judgment: Judgment normal.     Data: CT IMPRESSION: Bilateral nonobstructive nephrolithiasis.   Sigmoid diverticulosis without inflammation.   Aortic Atherosclerosis (ICD10-I70.0).    Duplex Findings:  +--------------------+--------+--------+------+--------+  Mesenteric         PSV cm/sEDV cm/sPlaqueComments  +--------------------+--------+--------+------+--------+  Aorta Prox             40                           +--------------------+--------+--------+------+--------+  Aorta Distal           60                           +--------------------+--------+--------+------+--------+  Celiac Artery Origin  109                           +--------------------+--------+--------+------+--------+  SMA Proximal          290      65                   +--------------------+--------+--------+------+--------+           +------------------+--------+--------+-------+  Right  Renal ArteryPSV cm/sEDV cm/sComment  +------------------+--------+--------+-------+  Origin              57      24            +------------------+--------+--------+-------+  Proximal            60      20            +------------------+--------+--------+-------+  Mid                 64      19            +------------------+--------+--------+-------+  Distal              58      20            +------------------+--------+--------+-------+   +-----------------+--------+--------+-------+  Left Renal ArteryPSV cm/sEDV cm/sComment  +-----------------+--------+--------+-------+  Origin            150      22            +-----------------+--------+--------+-------+  Proximal          121      24            +-----------------+--------+--------+-------+  Mid                93      22            +-----------------+--------+--------+-------+  Distal             46      13            +-----------------+--------+--------+-------+     Technologist observations: Left inferior pole avascular structure  measuring 2.1 x 1.8 x 2.1 cm, most likely representing simple cyst.   +------------+--------+--------+--+-----------+--------+--------+----+  Right KidneyPSV cm/sEDV cm/sRILeft KidneyPSV cm/sEDV cm/sRI    +------------+--------+--------+--+-----------+--------+--------+----+  Upper Pole                    Upper Pole 26      7       0.73  +------------+--------+--------+--+-----------+--------+--------+----+  Mid                          Mid        25      7       0.73  +------------+--------+--------+--+-----------+--------+--------+----+  Lower Pole                    Lower Pole 24      6       0.74  +------------+--------+--------+--+-----------+--------+--------+----+  Hilar                        Hilar      23      6       0.74  +------------+--------+--------+--+-----------+--------+--------+----+    +------------------+-------+------------------+--------+  Right Kidney             Left Kidney                 +------------------+-------+------------------+--------+  RAR                     RAR                         +------------------+-------+------------------+--------+  RAR (manual)             RAR (manual)                +------------------+-------+------------------+--------+  Cortex                  Cortex                      +------------------+-------+------------------+--------+  Cortex thickness  6.00 mmCorex thickness   15.00 mm  +------------------+-------+------------------+--------+  Kidney length (cm)8.1    Kidney length (cm)13.0      +------------------+-------+------------------+--------+     Summary:  Largest Aortic Diameter: 2.9 cm    Renal:    Right: Abnormal size for the right kidney (8.1 cm). Abnormal         cortical thickness of right kidney. Blunted renal flow         Abnormal renal perfusion and peripheral segmental flow noted         in the kidney, especially in the inferior pole. Minimal         arterial flow noted in the distal renal hilum.  Left:  Normal size of left kidney. Abnormal left Resisitve Index.         Normal cortical thickness of the left kidney. No evidence of         left renal artery stenosis. LRV flow present. Cyst(s) noted.         See measurements above.  Mesenteric:  70 to 99% stenosis in the superior  mesenteric artery.  Moderate atherosclerosis noted throughout aorta.      Assessment/Plan:    67 year old male sent here for concerns of SMA stenosis consistent with chronic mesenteric ischemia.  Unfortunately on my exam today the patient has guarding and 10 out of 10 abdominal pain this does not appear to consistent with chronic mesenteric ischemia.  I reviewed his most recent CT scans of duplexes which does demonstrate SMA stenoses but these were also there on his CT scans in December 2022 that  were performed with contrast.  He has been unable to receive contrast lately due to elevated creatinine secondary to Gleevac.  I have recommended the patient be evaluated emergently and he states that he would not be able to take a car ride and he is unable to sit up in my office today.  He is hemodynamically stable we have called 911 to have him transported to the emergency department where he can have full workup for abdominal pain and vascular surgery will also evaluate him there.      Maeola Harman MD [facility]

## 2022-12-20 NOTE — ED Triage Notes (Signed)
Pt BIB [facility] due to abdominal pain he's been having for 3 weeks.  He had a full workup done prior and they found a vascular blockage.  Pt has been unable to eat and if he does he has to take pain medicine to deal with the pain.  EMS noted upper right quadrant firmness.  VSS

## 2022-12-20 NOTE — Progress Notes (Signed)
ANTICOAGULATION CONSULT NOTE - Initial Consult  Pharmacy Consult for heparin Indication: SMA stenosis/occlusion  Allergies  Allergen Reactions   Lisinopril Cough    Patient Measurements: Height: 5\' 11"  (180.3 cm) Weight: 95.7 kg (211 lb) IBW/kg (Calculated) : 75.3 Heparin Dosing Weight: 94.6kg  Vital Signs: Temp: 97.9 F (36.6 C) (09/11 1320) Temp Source: Oral (09/11 1320) BP: 145/85 (09/11 1320) Pulse Rate: 65 (09/11 1320)  Labs: Recent Labs    12/20/22 1441  HGB 13.4  HCT 41.8  PLT 282  CREATININE 3.00*    Estimated Creatinine Clearance: 27.8 mL/min (A) (by C-G formula based on SCr of 3 mg/dL (H)).   Medical History: Past Medical History:  Diagnosis Date   Ankle fracture    Cancer (HCC) 1974   testicular cancer-at age 67   Cataract    bilateral sx   GERD (gastroesophageal reflux disease)    hx of   Hx of colonic polyps    pt unsure,thinks this was done in 1982 in [place]. no reports in EPIC   Hyperlipidemia    on meds   Hypertension    on meds   Kidney stones    Patella fracture    Retinal detachment    OD   Retinal tear of right eye    Syncopal episodes 2008     Assessment: 67 YOM presenting with abdominal pain, SMA stenosis vs. Occlusion, he is not on anticoagulation PTA, CBC wnl  Goal of Therapy:  Heparin level 0.3-0.7 units/ml Monitor platelets by anticoagulation protocol: Yes   Plan:  Heparin 6000 units IV x 1, and gtt at 1500 units/hr F/u 6 hour heparin level F/u VVS plans  Daylene Posey, PharmD, [facility] Clinical Pharmacist ED Pharmacist Phone # [phone] 12/20/2022 3:36 PM

## 2022-12-20 NOTE — Consult Note (Signed)
Hospital Consult    Reason for Consult: Abdominal pain Requesting Physician: ED MRN #:  [id number]  History of Present Illness: This is a 67 y.o. male recently seen in the outpatient vascular surgery office with known mesenteric artery stenosis having previously undergone SMA angiogram at the time of gastrointestinal stromal tumor removal in 2022.  Over the last several weeks, he has been evaluated at both [facility] and [place] with noncontrasted CT scan due to abdominal pain.  A contrasted scan was not obtained due to chronic kidney disease from previous Gleevec administration.  Over the last Avril weeks, he has had 30 pounds of weight loss, and has had difficulty eating and drinking.  Abdominal pain was 10 out of 10 earlier this morning has improved.  Last several weeks, [name] is also appreciated new onset buttock claudication that occurs when ambulating or going up stairs.  He is limited mainly by respiratory status, but states the burning in his right hip is new.  Past Medical History:  Diagnosis Date   Ankle fracture    Cancer (HCC) 1974   testicular cancer-at age 37   Cataract    bilateral sx   GERD (gastroesophageal reflux disease)    hx of   Hx of colonic polyps    pt unsure,thinks this was done in 1982 in [place]. no reports in EPIC   Hyperlipidemia    on meds   Hypertension    on meds   Kidney stones    Patella fracture    Retinal detachment    OD   Retinal tear of right eye    Syncopal episodes 2008    Past Surgical History:  Procedure Laterality Date   AIR/FLUID EXCHANGE Right 06/16/2014   Procedure: AIR/FLUID EXCHANGE;  Surgeon: Sherrie George, MD;  Location: [facility] OR;  Service: Ophthalmology;  Laterality: Right;   BIOPSY  04/04/2021   Procedure: BIOPSY;  Surgeon: Meridee Score Netty Starring., MD;  Location: [facility] ENDOSCOPY;  Service: Gastroenterology;;   CATARACT EXTRACTION     CATARACT EXTRACTION  09/2014   rt eye//left 01/2015   CATARACT EXTRACTION, BILATERAL  2016   per  Dr. Elmer Picker    COLONOSCOPY  02/16/2020   per Dr. Rhea Belton, adenomatous polyps, repeat in 3 yrs   ENTEROSCOPY N/A 04/04/2021   Procedure: ENTEROSCOPY;  Surgeon: Mansouraty, Netty Starring., MD;  Location: [facility] ENDOSCOPY;  Service: Gastroenterology;  Laterality: N/A;   EYE SURGERY     GAS INSERTION Right 06/16/2014   Procedure: INSERTION OF GAS;  Surgeon: Sherrie George, MD;  Location: [facility] OR;  Service: Ophthalmology;  Laterality: Right;  C3F8   GIVENS CAPSULE STUDY N/A 04/02/2021   Procedure: GIVENS CAPSULE STUDY;  Surgeon: Lemar Lofty., MD;  Location: [facility] ENDOSCOPY;  Service: Gastroenterology;  Laterality: N/A;   HYDROCELE EXCISION     IR ANGIOGRAM VISCERAL SELECTIVE  04/04/2021   IR US GUIDE VASC ACCESS LEFT  04/04/2021   KNEE ARTHROSCOPY Right    x 2   LASER PHOTO ABLATION Right 06/16/2014   Procedure: LASER PHOTO ABLATION;  Surgeon: Sherrie George, MD;  Location: [facility] OR;  Service: Ophthalmology;  Laterality: Right;  Headscope laser and endolaser    ORCHIECTOMY     right testicle   REPAIR OF COMPLEX TRACTION RETINAL DETACHMENT Right 06/16/2014   Procedure: REPAIR OF COMPLEX TRACTION RETINAL DETACHMENT;  Surgeon: Sherrie George, MD;  Location: [facility] OR;  Service: Ophthalmology;  Laterality: Right;   RETINAL DETACHMENT SURGERY  06/2014   SUBMUCOSAL TATTOO INJECTION  04/04/2021   Procedure: SUBMUCOSAL TATTOO INJECTION;  Surgeon: Lemar Lofty., MD;  Location: [facility] ENDOSCOPY;  Service: Gastroenterology;;   TONSILLECTOMY     VITRECTOMY Right 06/16/2014   WISDOM TOOTH EXTRACTION      Allergies  Allergen Reactions   Lisinopril Cough    Prior to Admission medications   Medication Sig Start Date End Date Taking? Authorizing Provider  chlorthalidone (HYGROTON) 25 MG tablet Take 12.5 mg by mouth every morning. 11/17/22   [provider]  cloNIDine (CATAPRES) 0.2 MG tablet Take 1 tablet (0.2 mg total) by mouth 2 (two) times daily. 11/21/22   [name], MD  hydrALAZINE  (APRESOLINE) 50 MG tablet Take 1 tablet (50 mg total) by mouth 3 (three) times daily. 11/21/22   [name], MD  HYDROcodone-acetaminophen (NORCO) 10-325 MG tablet Take 1 tablet by mouth every 6 (six) hours as needed for moderate pain. 12/06/22   [name], MD  Iron, Ferrous Sulfate, 325 (65 Fe) MG TABS Take 325 mg by mouth 2 (two) times daily with a meal. 08/22/21   [name], MD  losartan (COZAAR) 100 MG tablet Take 1 tablet (100 mg total) by mouth daily. 11/20/22   [name], MD  ondansetron (ZOFRAN-ODT) 4 MG disintegrating tablet Take 4 mg by mouth every 8 (eight) hours as needed. 11/30/22   [provider]  pantoprazole (PROTONIX) 40 MG tablet Take 40 mg by mouth daily.    [provider]  rosuvastatin (CRESTOR) 40 MG tablet TAKE 1 TABLET BY MOUTH EVERY DAY 11/02/22   [name], MD  sildenafil (VIAGRA) 100 MG tablet Take 1 tablet (100 mg total) by mouth as needed for erectile dysfunction. 07/11/22   [name], MD  sucralfate (CARAFATE) 1 g tablet Take 1 g by mouth 4 (four) times daily. 11/30/22   [provider]  Syringe/Needle, Disp, (SYRINGE 3CC/22GX1-1/2") 22G X 1-1/2" 3 ML MISC 1 Application by Does not apply route every 14 (fourteen) days. 07/13/22   [name], MD  testosterone cypionate (DEPOTESTOSTERONE CYPIONATE) 200 MG/ML injection Inject 1 mL (200 mg total) into the muscle every 14 (fourteen) days. 08/29/22   [name], MD    Social History   Socioeconomic History   Marital status: Married    Spouse name: Not on file   Number of children: Not on file   Years of education: Not on file   Highest education level: Some college, no degree  Occupational History   Occupation: Chief Executive Officer: [employer]  Tobacco Use   Smoking status: Never   Smokeless tobacco: Never  Vaping Use   Vaping status: Never Used  Substance and Sexual Activity   Alcohol use: Not Currently    Comment: 2 beers monthly   Drug use: No    Sexual activity: Not on file  Other Topics Concern   Not on file  Social History Narrative   Not on file   Social Determinants of Health   Financial Resource Strain: Low Risk  (12/01/2022)   Overall Financial Resource Strain (CARDIA)    Difficulty of Paying Living Expenses: Not hard at all  Food Insecurity: No Food Insecurity (12/01/2022)   Hunger Vital Sign    Worried About Running Out of Food in the Last Year: Never true    Ran Out of Food in the Last Year: Never true  Transportation Needs: No Transportation Needs (12/01/2022)   PRAPARE - Transportation  Lack of Transportation (Medical): No    Lack of Transportation (Non-Medical): No  Physical Activity: Sufficiently Active (12/01/2022)   Exercise Vital Sign    Days of Exercise per Week: 3 days    Minutes of Exercise per Session: 120 min  Stress: No Stress Concern Present (12/01/2022)   [employer] of Occupational Health - Occupational Stress Questionnaire    Feeling of Stress : Not at all  Social Connections: Moderately Integrated (12/01/2022)   Social Connection and Isolation Panel [NHANES]    Frequency of Communication with Friends and Family: More than three times a week    Frequency of Social Gatherings with Friends and Family: Once a week    Attends Religious Services: Never    Database administrator or Organizations: No    Attends Engineer, structural: More than 4 times per year    Marital Status: Married  Catering manager Violence: Not At Risk (11/20/2022)   Humiliation, Afraid, Rape, and Kick questionnaire    Fear of Current or Ex-Partner: No    Emotionally Abused: No    Physically Abused: No    Sexually Abused: No   Family History  Problem Relation Age of Onset   Parkinson's disease Mother    Arrhythmia Father    Coronary artery disease Other        fhx   Hypertension Other        fhx   Sudden death Other        fhx   Colon cancer Neg Hx    Colon polyps Neg Hx    Esophageal cancer Neg Hx     Rectal cancer Neg Hx    Stomach cancer Neg Hx     ROS: Otherwise negative unless mentioned in HPI  Physical Examination  Vitals:   12/20/22 1320  BP: (!) 145/85  Pulse: 65  Resp: 16  Temp: 97.9 F (36.6 C)  SpO2: 100%   Body mass index is 29.43 kg/m.  General:  WDWN in NAD Gait: Not observed HENT: WNL, normocephalic Pulmonary: normal non-labored breathing, without Rales, rhonchi,  wheezing Cardiac: regular Abdomen:  soft, NT/ND, no masses Skin: without rashes Vascular Exam/Pulses: 2+ left femoral, 2+ left PT, nonpalpable right Pham, nonpalpable right pedal pulses Extremities: without ischemic changes, without Gangrene , without cellulitis; without open wounds;  Musculoskeletal: no muscle wasting or atrophy  Neurologic: A&O X 3;  No focal weakness or paresthesias are detected; speech is fluent/normal Psychiatric:  The pt has Normal affect. Lymph:  Unremarkable  CBC    Component Value Date/Time   WBC 12.8 (H) 12/20/2022 1441   RBC 5.33 12/20/2022 1441   HGB 13.4 12/20/2022 1441   HCT 41.8 12/20/2022 1441   PLT 282 12/20/2022 1441   MCV 78.4 (L) 12/20/2022 1441   MCH 25.1 (L) 12/20/2022 1441   MCHC 32.1 12/20/2022 1441   RDW 13.4 12/20/2022 1441   LYMPHSABS 0.9 10/30/2022 1654   MONOABS 0.6 10/30/2022 1654   EOSABS 0.3 10/30/2022 1654   BASOSABS 0.0 10/30/2022 1654    BMET    Component Value Date/Time   NA 137 11/27/2022 0755   K 3.9 11/27/2022 0755   CL 99 11/27/2022 0755   CO2 23 11/27/2022 0755   GLUCOSE 139 (H) 11/27/2022 0755   BUN 30 (H) 11/27/2022 0755   CREATININE 2.22 (H) 11/27/2022 0755   CALCIUM 9.2 11/27/2022 0755   GFRNONAA 31 (L) 11/27/2022 0755   GFRAA >60 04/14/2015 1127    COAGS: No results found  for: "INR", "PROTIME"    ASSESSMENT/PLAN: This is a 67 y.o. male presenting with a 3-week history of abdominal pain of unknown etiology.  At the time of new onset abdominal pain, he also noted new onset claudication involving the right  buttock.  I question whether [name] had embolic event leading to further SMA stenosis versus occlusion as well as right-sided iliac stenosis versus occlusion.  Being that [name] has failed to thrive at home with 30 pounds weight loss, he would benefit from admission.  Pending labs, he would benefit from CT angio chest abdomen pelvis, CT angio abdomen pelvis should creatinine be of concern.  He would also benefit from echo.  Please initiate heparin drip. Appreciate medicine admission. Will continue to follow and will discuss possible intervention pending CT results.   Victorino Sparrow MD [facility] [phone] 12/20/2022  3:07 PM

## 2022-12-20 NOTE — H&P (Signed)
History and Physical    [name] [name]:[id number] DOB: 1956/02/09 DOA: 12/20/2022  PCP: [name], MD  Patient coming from: Vascular surgery office  I have personally briefly reviewed patient's old medical records in [facility]  Chief Complaint: Abdominal pain  HPI: [name] [name] is a 67 y.o. male with medical history significant of hypertension, hyperlipidemia, GERD, known mesenteric artery stenosis having previously undergone SMA angiogram at the time of gastrointestinal stromal tumor removal in 2022. Over the last several weeks, he has been evaluated at both [facility] and [place] with noncontrasted CT scan due to abdominal pain.  He has had couple of noncontrast CT of the abdomen, contrasted scans were not obtained due to chronic kidney disease.  Patient has had worsening abdominal pain for last 3 to 4 weeks and has lost about 30 pounds.  He went to see vascular surgery today but due to his overall clinical condition, he was sent to the emergency department for hospitalization and inpatient workup and management.  ED Course: Upon arrival to ED, he was hemodynamically stable with normal vitals and labs other than elevated creatinine.  Patient was seen by vascular surgery in the ED.  Hospitalist were called for admission.  Review of Systems: As per HPI otherwise negative.    Past Medical History:  Diagnosis Date   Ankle fracture    Cancer (HCC) 1974   testicular cancer-at age 60   Cataract    bilateral sx   GERD (gastroesophageal reflux disease)    hx of   Hx of colonic polyps    pt unsure,thinks this was done in 1982 in [place]. no reports in EPIC   Hyperlipidemia    on meds   Hypertension    on meds   Kidney stones    Patella fracture    Retinal detachment    OD   Retinal tear of right eye    Syncopal episodes 2008    Past Surgical History:  Procedure Laterality Date   AIR/FLUID EXCHANGE Right 06/16/2014   Procedure: AIR/FLUID EXCHANGE;  Surgeon: Sherrie George, MD;  Location: [facility] OR;  Service: Ophthalmology;  Laterality: Right;   BIOPSY  04/04/2021   Procedure: BIOPSY;  Surgeon: Meridee Score Netty Starring., MD;  Location: [facility] ENDOSCOPY;  Service: Gastroenterology;;   CATARACT EXTRACTION     CATARACT EXTRACTION  09/2014   rt eye//left 01/2015   CATARACT EXTRACTION, BILATERAL  2016   per Dr. Elmer Picker    COLONOSCOPY  02/16/2020   per Dr. Rhea Belton, adenomatous polyps, repeat in 3 yrs   ENTEROSCOPY N/A 04/04/2021   Procedure: ENTEROSCOPY;  Surgeon: Mansouraty, Netty Starring., MD;  Location: [facility] ENDOSCOPY;  Service: Gastroenterology;  Laterality: N/A;   EYE SURGERY     GAS INSERTION Right 06/16/2014   Procedure: INSERTION OF GAS;  Surgeon: Sherrie George, MD;  Location: [facility] OR;  Service: Ophthalmology;  Laterality: Right;  C3F8   GIVENS CAPSULE STUDY N/A 04/02/2021   Procedure: GIVENS CAPSULE STUDY;  Surgeon: Lemar Lofty., MD;  Location: [facility] ENDOSCOPY;  Service: Gastroenterology;  Laterality: N/A;   HYDROCELE EXCISION     IR ANGIOGRAM VISCERAL SELECTIVE  04/04/2021   IR US GUIDE VASC ACCESS LEFT  04/04/2021   KNEE ARTHROSCOPY Right    x 2   LASER PHOTO ABLATION Right 06/16/2014   Procedure: LASER PHOTO ABLATION;  Surgeon: Sherrie George, MD;  Location: [facility] OR;  Service: Ophthalmology;  Laterality: Right;  Headscope laser and endolaser  ORCHIECTOMY     right testicle   REPAIR OF COMPLEX TRACTION RETINAL DETACHMENT Right 06/16/2014   Procedure: REPAIR OF COMPLEX TRACTION RETINAL DETACHMENT;  Surgeon: Sherrie George, MD;  Location: [facility] OR;  Service: Ophthalmology;  Laterality: Right;   RETINAL DETACHMENT SURGERY  06/2014   SUBMUCOSAL TATTOO INJECTION  04/04/2021   Procedure: SUBMUCOSAL TATTOO INJECTION;  Surgeon: Lemar Lofty., MD;  Location: [facility] ENDOSCOPY;  Service: Gastroenterology;;   TONSILLECTOMY     VITRECTOMY Right 06/16/2014   WISDOM TOOTH EXTRACTION       reports that he has never smoked. He has never used smokeless  tobacco. He reports that he does not currently use alcohol. He reports that he does not use drugs.  Allergies  Allergen Reactions   Lisinopril Cough    Family History  Problem Relation Age of Onset   Parkinson's disease Mother    Arrhythmia Father    Coronary artery disease Other        fhx   Hypertension Other        fhx   Sudden death Other        fhx   Colon cancer Neg Hx    Colon polyps Neg Hx    Esophageal cancer Neg Hx    Rectal cancer Neg Hx    Stomach cancer Neg Hx     Prior to Admission medications   Medication Sig Start Date End Date Taking? Authorizing Provider  chlorthalidone (HYGROTON) 25 MG tablet Take 12.5 mg by mouth every morning. 11/17/22   [provider]  cloNIDine (CATAPRES) 0.2 MG tablet Take 1 tablet (0.2 mg total) by mouth 2 (two) times daily. 11/21/22   [name], MD  hydrALAZINE (APRESOLINE) 50 MG tablet Take 1 tablet (50 mg total) by mouth 3 (three) times daily. 11/21/22   [name], MD  HYDROcodone-acetaminophen (NORCO) 10-325 MG tablet Take 1 tablet by mouth every 6 (six) hours as needed for moderate pain. 12/06/22   [name], MD  Iron, Ferrous Sulfate, 325 (65 Fe) MG TABS Take 325 mg by mouth 2 (two) times daily with a meal. 08/22/21   [name], MD  losartan (COZAAR) 100 MG tablet Take 1 tablet (100 mg total) by mouth daily. 11/20/22   [name], MD  ondansetron (ZOFRAN-ODT) 4 MG disintegrating tablet Take 4 mg by mouth every 8 (eight) hours as needed. 11/30/22   [provider]  pantoprazole (PROTONIX) 40 MG tablet Take 40 mg by mouth daily.    [provider]  rosuvastatin (CRESTOR) 40 MG tablet TAKE 1 TABLET BY MOUTH EVERY DAY 11/02/22   [name], MD  sildenafil (VIAGRA) 100 MG tablet Take 1 tablet (100 mg total) by mouth as needed for erectile dysfunction. 07/11/22   [name], MD  sucralfate (CARAFATE) 1 g tablet Take 1 g by mouth 4 (four) times daily. 11/30/22   [provider]   Syringe/Needle, Disp, (SYRINGE 3CC/22GX1-1/2") 22G X 1-1/2" 3 ML MISC 1 Application by Does not apply route every 14 (fourteen) days. 07/13/22   [name], MD  testosterone cypionate (DEPOTESTOSTERONE CYPIONATE) 200 MG/ML injection Inject 1 mL (200 mg total) into the muscle every 14 (fourteen) days. 08/29/22   [name], MD    Physical Exam: Vitals:   12/20/22 1320  BP: (!) 145/85  Pulse: 65  Resp: 16  Temp: 97.9 F (36.6 C)  TempSrc: Oral  SpO2: 100%  Weight: 95.7 kg  Height:  5\' 11"  (1.803 m)    Constitutional: NAD, calm, comfortable Vitals:   12/20/22 1320  BP: (!) 145/85  Pulse: 65  Resp: 16  Temp: 97.9 F (36.6 C)  TempSrc: Oral  SpO2: 100%  Weight: 95.7 kg  Height: 5\' 11"  (1.803 m)   Eyes: PERRL, lids and conjunctivae normal ENMT: Mucous membranes are moist. Posterior pharynx clear of any exudate or lesions.Normal dentition.  Neck: normal, supple, no masses, no thyromegaly Respiratory: clear to auscultation bilaterally, no wheezing, no crackles. Normal respiratory effort. No accessory muscle use.  Cardiovascular: Regular rate and rhythm, no murmurs / rubs / gallops. No extremity edema. 2+ pedal pulses. No carotid bruits.  Abdomen: no tenderness, no masses palpated. No hepatosplenomegaly. Bowel sounds positive.  Musculoskeletal: no clubbing / cyanosis. No joint deformity upper and lower extremities. Good ROM, no contractures. Normal muscle tone.  Skin: no rashes, lesions, ulcers. No induration Neurologic: CN 2-12 grossly intact. Sensation intact, DTR normal. Strength 5/5 in all 4.  Psychiatric: Normal judgment and insight. Alert and oriented x 3. Normal mood.    Labs on Admission: I have personally reviewed following labs and imaging studies  CBC: Recent Labs  Lab 12/20/22 1441  WBC 12.8*  HGB 13.4  HCT 41.8  MCV 78.4*  PLT 282   Basic Metabolic Panel: Recent Labs  Lab 12/20/22 1441  NA 138  K 3.8  CL 99  CO2 29  GLUCOSE 126*  BUN 36*   CREATININE 3.00*  CALCIUM 8.6*   GFR: Estimated Creatinine Clearance: 27.8 mL/min (A) (by C-G formula based on SCr of 3 mg/dL (H)). Liver Function Tests: Recent Labs  Lab 12/20/22 1441  AST 74*  ALT 105*  ALKPHOS 67  BILITOT 1.2  PROT 6.8  ALBUMIN 3.1*   No results for input(s): "LIPASE", "AMYLASE" in the last 168 hours. No results for input(s): "AMMONIA" in the last 168 hours. Coagulation Profile: No results for input(s): "INR", "PROTIME" in the last 168 hours. Cardiac Enzymes: No results for input(s): "CKTOTAL", "CKMB", "CKMBINDEX", "TROPONINI" in the last 168 hours. BNP (last 3 results) Recent Labs    07/11/22 1451  PROBNP 7.0   HbA1C: No results for input(s): "HGBA1C" in the last 72 hours. CBG: No results for input(s): "GLUCAP" in the last 168 hours. Lipid Profile: No results for input(s): "CHOL", "HDL", "LDLCALC", "TRIG", "CHOLHDL", "LDLDIRECT" in the last 72 hours. Thyroid Function Tests: No results for input(s): "TSH", "T4TOTAL", "FREET4", "T3FREE", "THYROIDAB" in the last 72 hours. Anemia Panel: No results for input(s): "VITAMINB12", "FOLATE", "FERRITIN", "TIBC", "IRON", "RETICCTPCT" in the last 72 hours. Urine analysis:    Component Value Date/Time   COLORURINE YELLOW 11/27/2022 0757   APPEARANCEUR CLEAR 11/27/2022 0757   LABSPEC 1.017 11/27/2022 0757   PHURINE 6.0 11/27/2022 0757   GLUCOSEU NEGATIVE 11/27/2022 0757   HGBUR NEGATIVE 11/27/2022 0757   HGBUR negative 09/30/2009 0804   BILIRUBINUR NEGATIVE 11/27/2022 0757   BILIRUBINUR n 02/12/2015 0949   KETONESUR NEGATIVE 11/27/2022 0757   PROTEINUR >=300 (A) 11/27/2022 0757   UROBILINOGEN 0.2 02/12/2015 0949   UROBILINOGEN 0.2 09/30/2009 0804   NITRITE NEGATIVE 11/27/2022 0757   LEUKOCYTESUR NEGATIVE 11/27/2022 0757    Radiological Exams on Admission: DG Chest Port 1 View  Result Date: 12/20/2022 CLINICAL DATA:  Upper abdominal pain EXAM: PORTABLE CHEST 1 VIEW COMPARISON:  09/11/2022 FINDINGS:  Cardiac and mediastinal contours are within normal limits. No focal pulmonary opacity. No pleural effusion or pneumothorax. No acute osseous abnormality. IMPRESSION: No acute cardiopulmonary process. Electronically  Signed   By: Wiliam Ke M.D.   On: 12/20/2022 15:48    Assessment/Plan Principal Problem:   Superior mesenteric artery stenosis (HCC)   Abdominal pain with weight loss/superior mesenteric artery stenosis vs thrombosis suspected: Patient seen by vascular surgery.  They are suspecting either stenosis or thrombosis of the superior mesenteric artery.  They have started the patient on heparin.  I will start him on morphine IV for severe pain as his pain is 10 out of 10.  Further management per vascular surgery.  I will start him on full liquid diet for now and keep him n.p.o. from midnight in case there is any procedure planned for tomorrow morning.  AKI on CKD stage IIIa/dehydration: Previously his creatinine appears to be around 1.6-1.8 at baseline but today creatinine is 3.0.  Likely due to dehydration due to inability to have good p.o. intake.  Hold all nephrotoxic agents, started on normal saline and check labs in the morning.  Essential hypertension: Patient appears to be on several medications including clonidine, chlorthalidone, losartan.  Due to AKI on CKD, I am going to hold chlorthalidone and losartan but will resume clonidine to prevent withdrawal.  Blood pressure is controlled.  GERD: Continue PPI.  Hyperlipidemia: Resume Crestor.  Hypoalbuminemia: Likely due to poor nutrition.  He says he has been losing 2 pounds every day and has lost 30 pounds in 3 weeks.  Will consult dietitian.  He may need alternative means of nutrition.  Elevated LFTs: Slightly elevated LFTs.  Source unknown.  Follow labs in the morning.  DVT prophylaxis: Heparin Code Status: Full code Family Communication: Wife present at bedside.  Plan of care discussed with patient in length and he verbalized  understanding and agreed with it. Disposition Plan: To be determined based on vascular surgery workup Consults called: Vascular surgery  Hughie Closs MD [facility]  *Please note that this is a verbal dictation therefore any spelling or grammatical errors are due to the "Dragon [employer] One" system interpretation.  Please page via Amion and do not message via secure chat for urgent patient care matters. Secure chat can be used for non urgent patient care matters. 12/20/2022, 4:19 PM  To contact the attending provider between 7A-7P or the covering provider during after hours 7P-7A, please log into the web site [email or web address]

## 2022-12-20 NOTE — Plan of Care (Signed)
  Problem: Education: °Goal: Knowledge of General Education information will improve °Description: Including pain rating scale, medication(s)/side effects and non-pharmacologic comfort measures °Outcome: Progressing °  °Problem: Health Behavior/Discharge Planning: °Goal: Ability to manage health-related needs will improve °Outcome: Progressing °  °Problem: Clinical Measurements: °Goal: Will remain free from infection °Outcome: Progressing °Goal: Diagnostic test results will improve °Outcome: Progressing °  °Problem: Activity: °Goal: Risk for activity intolerance will decrease °Outcome: Progressing °  °Problem: Nutrition: °Goal: Adequate nutrition will be maintained °Outcome: Progressing °  °Problem: Coping: °Goal: Level of anxiety will decrease °Outcome: Progressing °  °Problem: Elimination: °Goal: Will not experience complications related to bowel motility °Outcome: Progressing °Goal: Will not experience complications related to urinary retention °Outcome: Progressing °  °Problem: Pain Managment: °Goal: General experience of comfort will improve °Outcome: Progressing °  °Problem: Safety: °Goal: Ability to remain free from injury will improve °Outcome: Progressing °  °Problem: Skin Integrity: °Goal: Risk for impaired skin integrity will decrease °Outcome: Progressing °  °

## 2022-12-20 NOTE — ED Notes (Signed)
ED TO INPATIENT HANDOFF REPORT  ED Nurse Name and Phone #: Morrie Sheldon 37  S Name/Age/Gender [name] [name] 67 y.o. male Room/Bed: 039C/039C  Code Status   Code Status: Full Code  Home/SNF/Other Home Patient oriented to: self, place, time, and situation Is this baseline? Yes   Triage Complete: Triage complete  Chief Complaint Superior mesenteric artery stenosis (HCC) [K55.1]  Triage Note Pt BIB [facility] from [facility] due to abdominal pain he's been having for 3 weeks.  He had a full workup done prior and they found a vascular blockage.  Pt has been unable to eat and if he does he has to take pain medicine to deal with the pain.  EMS noted upper right quadrant firmness.  VSS   Allergies Allergies  Allergen Reactions   Lisinopril Cough    Level of Care/Admitting Diagnosis ED Disposition     ED Disposition  Admit   Condition  --   Comment  Hospital Area: [facility] [[id number]]  Level of Care: Med-Surg [16]  May admit patient to [name] or [name] if equivalent level of care is available:: No  Covid Evaluation: Asymptomatic - no recent exposure (last 10 days) testing not required  Diagnosis: Superior mesenteric artery stenosis [facility]) [[id number]]  Admitting Physician: Hughie Closs [[id number]]  Attending Physician: Hughie Closs [[id number]]  Certification:: I certify this patient will need inpatient services for at least 2 midnights  Expected Medical Readiness: 12/23/2022          B Medical/Surgery History Past Medical History:  Diagnosis Date   Ankle fracture    Cancer (HCC) 1974   testicular cancer-at age 52   Cataract    bilateral sx   GERD (gastroesophageal reflux disease)    hx of   Hx of colonic polyps    pt unsure,thinks this was done in 1982 in [place]. no reports in EPIC   Hyperlipidemia    on meds   Hypertension    on meds   Kidney stones    Patella fracture    Retinal detachment    OD   Retinal tear of right eye     Syncopal episodes 2008   Past Surgical History:  Procedure Laterality Date   AIR/FLUID EXCHANGE Right 06/16/2014   Procedure: AIR/FLUID EXCHANGE;  Surgeon: Sherrie George, MD;  Location: [facility] OR;  Service: Ophthalmology;  Laterality: Right;   BIOPSY  04/04/2021   Procedure: BIOPSY;  Surgeon: Meridee Score Netty Starring., MD;  Location: [facility] ENDOSCOPY;  Service: Gastroenterology;;   CATARACT EXTRACTION     CATARACT EXTRACTION  09/2014   rt eye//left 01/2015   CATARACT EXTRACTION, BILATERAL  2016   per Dr. Elmer Picker    COLONOSCOPY  02/16/2020   per Dr. Rhea Belton, adenomatous polyps, repeat in 3 yrs   ENTEROSCOPY N/A 04/04/2021   Procedure: ENTEROSCOPY;  Surgeon: Mansouraty, Netty Starring., MD;  Location: [facility] ENDOSCOPY;  Service: Gastroenterology;  Laterality: N/A;   EYE SURGERY     GAS INSERTION Right 06/16/2014   Procedure: INSERTION OF GAS;  Surgeon: Sherrie George, MD;  Location: [facility] OR;  Service: Ophthalmology;  Laterality: Right;  [id number]   GIVENS CAPSULE STUDY N/A 04/02/2021   Procedure: GIVENS CAPSULE STUDY;  Surgeon: Lemar Lofty., MD;  Location: [facility] ENDOSCOPY;  Service: Gastroenterology;  Laterality: N/A;   HYDROCELE EXCISION     IR ANGIOGRAM VISCERAL SELECTIVE  04/04/2021   IR US GUIDE VASC ACCESS LEFT  04/04/2021   KNEE ARTHROSCOPY Right  x 2   LASER PHOTO ABLATION Right 06/16/2014   Procedure: LASER PHOTO ABLATION;  Surgeon: Sherrie George, MD;  Location: [facility] OR;  Service: Ophthalmology;  Laterality: Right;  Headscope laser and endolaser    ORCHIECTOMY     right testicle   REPAIR OF COMPLEX TRACTION RETINAL DETACHMENT Right 06/16/2014   Procedure: REPAIR OF COMPLEX TRACTION RETINAL DETACHMENT;  Surgeon: Sherrie George, MD;  Location: [facility] OR;  Service: Ophthalmology;  Laterality: Right;   RETINAL DETACHMENT SURGERY  06/2014   SUBMUCOSAL TATTOO INJECTION  04/04/2021   Procedure: SUBMUCOSAL TATTOO INJECTION;  Surgeon: Lemar Lofty., MD;  Location: [facility] ENDOSCOPY;  Service:  Gastroenterology;;   TONSILLECTOMY     VITRECTOMY Right 06/16/2014   WISDOM TOOTH EXTRACTION       A IV Location/Drains/Wounds Patient Lines/Drains/Airways Status     Active Line/Drains/Airways     Name Placement date Placement time Site Days   Peripheral IV 12/20/22 20 G Right Antecubital 12/20/22  1440  Antecubital  less than 1            Intake/Output Last 24 hours No intake or output data in the 24 hours ending 12/20/22 1755  Labs/Imaging Results for orders placed or performed during the hospital encounter of 12/20/22 (from the past 48 hour(s))  CBC     Status: Abnormal   Collection Time: 12/20/22  2:41 PM  Result Value Ref Range   WBC 12.8 (H) 4.0 - 10.5 K/uL   RBC 5.33 4.22 - 5.81 MIL/uL   Hemoglobin 13.4 13.0 - 17.0 g/dL   HCT 16.1 09.6 - 04.5 %   MCV 78.4 (L) 80.0 - 100.0 fL   MCH 25.1 (L) 26.0 - 34.0 pg   MCHC 32.1 30.0 - 36.0 g/dL   RDW 40.9 81.1 - 91.4 %   Platelets 282 150 - 400 K/uL   nRBC 0.0 0.0 - 0.2 %    Comment: Performed at [facility], [address], [place], [place] [address]  Comprehensive metabolic panel     Status: Abnormal   Collection Time: 12/20/22  2:41 PM  Result Value Ref Range   Sodium 138 135 - 145 mmol/L   Potassium 3.8 3.5 - 5.1 mmol/L   Chloride 99 98 - 111 mmol/L   CO2 29 22 - 32 mmol/L   Glucose, Bld 126 (H) 70 - 99 mg/dL    Comment: Glucose reference range applies only to samples taken after fasting for at least 8 hours.   BUN 36 (H) 8 - 23 mg/dL   Creatinine, Ser 6.21 (H) 0.61 - 1.24 mg/dL   Calcium 8.6 (L) 8.9 - 10.3 mg/dL   Total Protein 6.8 6.5 - 8.1 g/dL   Albumin 3.1 (L) 3.5 - 5.0 g/dL   AST 74 (H) 15 - 41 U/L   ALT 105 (H) 0 - 44 U/L   Alkaline Phosphatase 67 38 - 126 U/L   Total Bilirubin 1.2 0.3 - 1.2 mg/dL   GFR, Estimated 22 (L) >60 mL/min    Comment: (NOTE) Calculated using the CKD-EPI Creatinine Equation (2021)    Anion gap 10 5 - 15    Comment: Performed at [facility], [address], [place], [place] [address]   DG Chest Port 1 View  Result Date: 12/20/2022 CLINICAL DATA:  Upper abdominal pain EXAM: PORTABLE CHEST 1 VIEW COMPARISON:  09/11/2022 FINDINGS: Cardiac and mediastinal contours are within normal limits. No focal pulmonary opacity. No pleural effusion or pneumothorax.  No acute osseous abnormality. IMPRESSION: No acute cardiopulmonary process. Electronically Signed   By: Wiliam Ke M.D.   On: 12/20/2022 15:48    Pending Labs Unresulted Labs (From admission, onward)     Start     Ordered   12/21/22 0500  Heparin level (unfractionated)  Daily,   R     Placed in "And" Linked Group   12/20/22 1537   12/21/22 0500  CBC  Daily,   R     Placed in "And" Linked Group   12/20/22 1537   12/21/22 0500  Magnesium  Tomorrow morning,   R        12/20/22 1619   12/21/22 0500  Comprehensive metabolic panel  Daily,   R      12/20/22 1625   12/20/22 2300  Heparin level (unfractionated)  Once-Timed,   URGENT        12/20/22 1537   12/20/22 1619  TSH  Once,   R        12/20/22 1619   12/20/22 1618  HIV Antibody (routine testing w rflx)  (HIV Antibody (Routine testing w reflex) panel)  Once,   R        12/20/22 1619            Vitals/Pain Today's Vitals   12/20/22 1320 12/20/22 1512 12/20/22 1741  BP: (!) 145/85    Pulse: 65    Resp: 16    Temp: 97.9 F (36.6 C)    TempSrc: Oral    SpO2: 100%    Weight: 95.7 kg    Height: 5\' 11"  (1.803 m)    PainSc: 10-Worst pain ever 8  10-Worst pain ever    Isolation Precautions No active isolations  Medications Medications  heparin ADULT infusion 100 units/mL (25000 units/218mL) (1,500 Units/hr Intravenous New Bag/Given 12/20/22 1556)  morphine (PF) 2 MG/ML injection 2 mg (2 mg Intravenous Given 12/20/22 1735)  0.9 %  sodium chloride infusion ( Intravenous New Bag/Given 12/20/22 1739)  cloNIDine (CATAPRES) tablet 0.2 mg (has no administration in time range)  pantoprazole (PROTONIX) EC tablet 40 mg (has no  administration in time range)  rosuvastatin (CRESTOR) tablet 40 mg (has no administration in time range)  sodium chloride flush (NS) 0.9 % injection 3 mL (has no administration in time range)  acetaminophen (TYLENOL) tablet 650 mg (has no administration in time range)    Or  acetaminophen (TYLENOL) suppository 650 mg (has no administration in time range)  feeding supplement (ENSURE ENLIVE / ENSURE PLUS) liquid 237 mL (237 mLs Oral Given 12/20/22 1739)  lactated ringers bolus 500 mL (0 mLs Intravenous Stopped 12/20/22 1546)  morphine (PF) 2 MG/ML injection 2 mg (2 mg Intravenous Given 12/20/22 1441)  heparin bolus via infusion 6,000 Units (6,000 Units Intravenous Bolus from Bag 12/20/22 1556)    Mobility walks     Focused Assessments See chart   R Recommendations: See Admitting Provider Note  Report given to:   Additional Notes: see chart

## 2022-12-20 NOTE — ED Provider Notes (Signed)
[place] EMERGENCY DEPARTMENT AT [facility] Provider Note   CSN: [id number] Arrival date & time: 12/20/22  1312     History  Chief Complaint  Patient presents with   Abdominal Pain    [name] [name] is a 67 y.o. male.  HPI 67 year old male history of hypertension, GIST tumor status postresection 2022 presents today from vascular office with complaints of ongoing upper abdominal pain for the past month with 30 pound weight loss.  He states the pain is in the upper abdomen and increases with any p.o. intake.  Pain waxes and wanes.  He stated it was more severe when he was seen in the vascular surgery office today.  He was transported via EMS to the ED.  He has had scans without contrast due to increased creatinine thought to be secondary to Gleevac.    Home Medications Prior to Admission medications   Medication Sig Start Date End Date Taking? Authorizing Provider  chlorthalidone (HYGROTON) 25 MG tablet Take 12.5 mg by mouth every morning. 11/17/22   [provider]  cloNIDine (CATAPRES) 0.2 MG tablet Take 1 tablet (0.2 mg total) by mouth 2 (two) times daily. 11/21/22   [name], MD  hydrALAZINE (APRESOLINE) 50 MG tablet Take 1 tablet (50 mg total) by mouth 3 (three) times daily. 11/21/22   [name], MD  HYDROcodone-acetaminophen (NORCO) 10-325 MG tablet Take 1 tablet by mouth every 6 (six) hours as needed for moderate pain. 12/06/22   [name], MD  Iron, Ferrous Sulfate, 325 (65 Fe) MG TABS Take 325 mg by mouth 2 (two) times daily with a meal. 08/22/21   [name], MD  losartan (COZAAR) 100 MG tablet Take 1 tablet (100 mg total) by mouth daily. 11/20/22   [name], MD  ondansetron (ZOFRAN-ODT) 4 MG disintegrating tablet Take 4 mg by mouth every 8 (eight) hours as needed. 11/30/22   [provider]  pantoprazole (PROTONIX) 40 MG tablet Take 40 mg by mouth daily.    [provider]  rosuvastatin (CRESTOR) 40 MG tablet TAKE 1  TABLET BY MOUTH EVERY DAY 11/02/22   [name], MD  sildenafil (VIAGRA) 100 MG tablet Take 1 tablet (100 mg total) by mouth as needed for erectile dysfunction. 07/11/22   [name], MD  sucralfate (CARAFATE) 1 g tablet Take 1 g by mouth 4 (four) times daily. 11/30/22   [provider]  Syringe/Needle, Disp, (SYRINGE 3CC/22GX1-1/2") 22G X 1-1/2" 3 ML MISC 1 Application by Does not apply route every 14 (fourteen) days. 07/13/22   [name], MD  testosterone cypionate (DEPOTESTOSTERONE CYPIONATE) 200 MG/ML injection Inject 1 mL (200 mg total) into the muscle every 14 (fourteen) days. 08/29/22   [name], MD      Allergies    Lisinopril    Review of Systems   Review of Systems  Physical Exam Updated Vital Signs BP (!) 145/85 (BP Location: Right Arm)   Pulse 65   Temp 97.9 F (36.6 C) (Oral)   Resp 16   Ht 1.803 m (5\' 11" )   Wt 95.7 kg   SpO2 100%   BMI 29.43 kg/m  Physical Exam Vitals reviewed.  HENT:     Head: Normocephalic.     Mouth/Throat:     Mouth: Mucous membranes are moist.  Eyes:     Extraocular Movements: Extraocular movements intact.  Cardiovascular:     Rate and Rhythm: Normal rate and regular  rhythm.     Heart sounds: Normal heart sounds.  Pulmonary:     Effort: Pulmonary effort is normal.  Abdominal:     General: Abdomen is flat. Bowel sounds are normal.     Palpations: Abdomen is soft.     Tenderness: There is abdominal tenderness in the right upper quadrant and epigastric area.  Skin:    General: Skin is warm and dry.     Capillary Refill: Capillary refill takes less than 2 seconds.  Neurological:     General: No focal deficit present.     Mental Status: He is alert.  Psychiatric:        Mood and Affect: Mood normal.     ED Results / Procedures / Treatments   Labs (all labs ordered are listed, but only abnormal results are displayed) Labs Reviewed  CBC - Abnormal; Notable for the following components:      Result Value    WBC 12.8 (*)    MCV 78.4 (*)    MCH 25.1 (*)    All other components within normal limits  COMPREHENSIVE METABOLIC PANEL - Abnormal; Notable for the following components:   Glucose, Bld 126 (*)    BUN 36 (*)    Creatinine, Ser 3.00 (*)    Calcium 8.6 (*)    Albumin 3.1 (*)    AST 74 (*)    ALT 105 (*)    GFR, Estimated 22 (*)    All other components within normal limits  HEPARIN LEVEL (UNFRACTIONATED)    EKG None  Radiology No results found.  Procedures .Critical Care  Performed by: Margarita Grizzle, MD Authorized by: Margarita Grizzle, MD   Critical care provider statement:    Critical care time (minutes):  30   Critical care was time spent personally by me on the following activities:  Development of treatment plan with patient or surrogate, discussions with consultants, evaluation of patient's response to treatment, examination of patient, ordering and review of laboratory studies, ordering and review of radiographic studies, ordering and performing treatments and interventions, pulse oximetry, re-evaluation of patient's condition and review of old charts     Medications Ordered in ED Medications  heparin bolus via infusion 6,000 Units (has no administration in time range)  heparin ADULT infusion 100 units/mL (25000 units/262mL) (has no administration in time range)  lactated ringers bolus 500 mL (500 mLs Intravenous New Bag/Given 12/20/22 1440)  morphine (PF) 2 MG/ML injection 2 mg (2 mg Intravenous Given 12/20/22 1441)    ED Course/ Medical Decision Making/ A&P                                 Medical Decision Making Amount and/or Complexity of Data Reviewed Labs: ordered. Radiology: ordered.  Risk Prescription drug management. Decision regarding hospitalization.   Patient sent from vascular surgery office with ongoing abdominal pain.  Patient has had prior evaluation with noncontrast and CT.  Patient has not had contrast due to elevated creatinine. Patient has had  30 pound weight loss. I reviewed Dr. Carola Rhine note and Discussed care with Dr. Roxan Hockey.  CBC c-Met chest x-Corley Maffeo currently pending Dr. Roxan Hockey will see in ED.  He advises that given patient's symptoms he would likely plan to proceed with hydration and heparin.  Advises patient to be admitted to medicine service for hydration and vascular will see and make recommendations.  Likely will need to wait for further imaging until we  can assess renal function. Labs have been reviewed creatinine remains elevated at 3 Blood count is normal IV fluids insulin Heparin consult per pharmacy Consult placed to medicine service for admission           Final Clinical Impression(s) / ED Diagnoses Final diagnoses:  Mesenteric ischemia (HCC)  Elevated serum creatinine    Rx / DC Orders ED Discharge Orders     None         Margarita Grizzle, MD 12/20/22 1612

## 2022-12-20 NOTE — Progress Notes (Signed)
ANTICOAGULATION CONSULT NOTE - Follow Up  Pharmacy Consult for heparin Indication: SMA stenosis/occlusion  Allergies  Allergen Reactions   Zestril [Lisinopril] Cough    Patient Measurements: Height: 5\' 11"  (180.3 cm) Weight: 95.7 kg (211 lb) IBW/kg (Calculated) : 75.3 Heparin Dosing Weight: 94.6kg  Vital Signs: Temp: 98.4 F (36.9 C) (09/11 2008) Temp Source: Oral (09/11 2008) BP: 146/76 (09/11 2008) Pulse Rate: 87 (09/11 2008)  Labs: Recent Labs    12/20/22 1441 12/20/22 2318  HGB 13.4 13.4  HCT 41.8 42.0  PLT 282 207  HEPARINUNFRC  --  <0.10*  CREATININE 3.00*  --     Estimated Creatinine Clearance: 27.8 mL/min (A) (by C-G formula based on SCr of 3 mg/dL (H)).   Medical History: Past Medical History:  Diagnosis Date   Ankle fracture    Cancer (HCC) 1974   testicular cancer-at age 10   Cataract    bilateral sx   GERD (gastroesophageal reflux disease)    hx of   Hx of colonic polyps    pt unsure,thinks this was done in 1982 in [place]. no reports in EPIC   Hyperlipidemia    on meds   Hypertension    on meds   Kidney stones    Patella fracture    Retinal detachment    OD   Retinal tear of right eye    Syncopal episodes 2008     Assessment: 67 YOM presenting with abdominal pain, SMA stenosis vs. Occlusion, he is not on anticoagulation PTA, CBC wnl  9/11 PM: heparin level returned <0.1 on 1500 units/hr (subtherapeutic). Per RN, no issues with the heparin infusion running continuously or pauses. Last CBC stable   Goal of Therapy:  Heparin level 0.3-0.7 units/ml Monitor platelets by anticoagulation protocol: Yes   Plan:  Heparin 2700 units IV x 1 Increase heparin gtt to 1750 units/hr F/u 6 hour heparin level F/u VVS plans  Arabella Merles, PharmD. Clinical Pharmacist 12/20/2022 11:55 PM

## 2022-12-21 ENCOUNTER — Encounter (HOSPITAL_COMMUNITY)
Admission: EM | Disposition: A | Discharge status: Rehabilitation Facility | Payer: Self-pay | Source: Home / Self Care | Attending: Internal Medicine

## 2022-12-21 ENCOUNTER — Inpatient Hospital Stay (HOSPITAL_COMMUNITY): Payer: Medicare Other

## 2022-12-21 ENCOUNTER — Other Ambulatory Visit: Payer: Self-pay

## 2022-12-21 ENCOUNTER — Inpatient Hospital Stay (HOSPITAL_COMMUNITY): Payer: Medicare Other | Admitting: Certified Registered Nurse Anesthetist

## 2022-12-21 ENCOUNTER — Encounter (HOSPITAL_COMMUNITY): Payer: Self-pay | Admitting: Family Medicine

## 2022-12-21 DIAGNOSIS — K55059 Acute (reversible) ischemia of intestine, part and extent unspecified: Secondary | ICD-10-CM

## 2022-12-21 DIAGNOSIS — K551 Chronic vascular disorders of intestine: Secondary | ICD-10-CM | POA: Diagnosis not present

## 2022-12-21 DIAGNOSIS — N1831 Chronic kidney disease, stage 3a: Secondary | ICD-10-CM

## 2022-12-21 DIAGNOSIS — E785 Hyperlipidemia, unspecified: Secondary | ICD-10-CM

## 2022-12-21 DIAGNOSIS — Z978 Presence of other specified devices: Secondary | ICD-10-CM

## 2022-12-21 DIAGNOSIS — I259 Chronic ischemic heart disease, unspecified: Secondary | ICD-10-CM

## 2022-12-21 DIAGNOSIS — K219 Gastro-esophageal reflux disease without esophagitis: Secondary | ICD-10-CM | POA: Insufficient documentation

## 2022-12-21 DIAGNOSIS — I129 Hypertensive chronic kidney disease with stage 1 through stage 4 chronic kidney disease, or unspecified chronic kidney disease: Secondary | ICD-10-CM

## 2022-12-21 DIAGNOSIS — N179 Acute kidney failure, unspecified: Secondary | ICD-10-CM | POA: Insufficient documentation

## 2022-12-21 DIAGNOSIS — E8809 Other disorders of plasma-protein metabolism, not elsewhere classified: Secondary | ICD-10-CM | POA: Insufficient documentation

## 2022-12-21 DIAGNOSIS — K559 Vascular disorder of intestine, unspecified: Secondary | ICD-10-CM | POA: Diagnosis not present

## 2022-12-21 HISTORY — PX: LAPAROTOMY: SHX154

## 2022-12-21 HISTORY — PX: BOWEL RESECTION: SHX1257

## 2022-12-21 HISTORY — PX: ANGIOPLASTY: SHX39

## 2022-12-21 HISTORY — PX: VISCERAL ANGIOGRAM: SHX5515

## 2022-12-21 HISTORY — PX: APPLICATION OF WOUND VAC: SHX5189

## 2022-12-21 HISTORY — PX: INSERTION OF ILIAC STENT: SHX6256

## 2022-12-21 HISTORY — PX: ULTRASOUND GUIDANCE FOR VASCULAR ACCESS: SHX6516

## 2022-12-21 LAB — CBC
HCT: 37 % — ABNORMAL LOW (ref 39.0–52.0)
HCT: 33.1 % — ABNORMAL LOW (ref 39.0–52.0)
Hemoglobin: 11.7 g/dL — ABNORMAL LOW (ref 13.0–17.0)
Hemoglobin: 10.5 g/dL — ABNORMAL LOW (ref 13.0–17.0)
MCH: 24.7 pg — ABNORMAL LOW (ref 26.0–34.0)
MCH: 25.1 pg — ABNORMAL LOW (ref 26.0–34.0)
MCHC: 31.6 g/dL (ref 30.0–36.0)
MCHC: 31.7 g/dL (ref 30.0–36.0)
MCV: 78.2 fL — ABNORMAL LOW (ref 80.0–100.0)
MCV: 79.2 fL — ABNORMAL LOW (ref 80.0–100.0)
Platelets: 227 10*3/uL (ref 150–400)
Platelets: 209 10*3/uL (ref 150–400)
RBC: 4.73 MIL/uL (ref 4.22–5.81)
RBC: 4.18 MIL/uL — ABNORMAL LOW (ref 4.22–5.81)
RDW: 13.7 % (ref 11.5–15.5)
RDW: 14 % (ref 11.5–15.5)
WBC: 14.1 10*3/uL — ABNORMAL HIGH (ref 4.0–10.5)
WBC: 16.6 10*3/uL — ABNORMAL HIGH (ref 4.0–10.5)
nRBC: 0 % (ref 0.0–0.2)
nRBC: 0 % (ref 0.0–0.2)

## 2022-12-21 LAB — COMPREHENSIVE METABOLIC PANEL
ALT: 95 U/L — ABNORMAL HIGH (ref 0–44)
AST: 65 U/L — ABNORMAL HIGH (ref 15–41)
Albumin: 2.8 g/dL — ABNORMAL LOW (ref 3.5–5.0)
Alkaline Phosphatase: 61 U/L (ref 38–126)
Anion gap: 11 (ref 5–15)
BUN: 37 mg/dL — ABNORMAL HIGH (ref 8–23)
CO2: 27 mmol/L (ref 22–32)
Calcium: 8.6 mg/dL — ABNORMAL LOW (ref 8.9–10.3)
Chloride: 100 mmol/L (ref 98–111)
Creatinine, Ser: 2.9 mg/dL — ABNORMAL HIGH (ref 0.61–1.24)
GFR, Estimated: 23 mL/min — ABNORMAL LOW (ref >60)
Glucose, Bld: 134 mg/dL — ABNORMAL HIGH (ref 70–99)
Potassium: 3.2 mmol/L — ABNORMAL LOW (ref 3.5–5.1)
Sodium: 138 mmol/L (ref 135–145)
Total Bilirubin: 1 mg/dL (ref 0.3–1.2)
Total Protein: 5.9 g/dL — ABNORMAL LOW (ref 6.5–8.1)

## 2022-12-21 LAB — POCT I-STAT 7, (LYTES, BLD GAS, ICA,H+H)
Acid-base deficit: 3 mmol/L — ABNORMAL HIGH (ref 0.0–2.0)
Bicarbonate: 22.6 mmol/L (ref 20.0–28.0)
Calcium, Ion: 1.08 mmol/L — ABNORMAL LOW (ref 1.15–1.40)
HCT: 31 % — ABNORMAL LOW (ref 39.0–52.0)
Hemoglobin: 10.5 g/dL — ABNORMAL LOW (ref 13.0–17.0)
O2 Saturation: 98 %
Patient temperature: 100.2
Potassium: 3.4 mmol/L — ABNORMAL LOW (ref 3.5–5.1)
Sodium: 138 mmol/L (ref 135–145)
TCO2: 24 mmol/L (ref 22–32)
pCO2 arterial: 43 mmHg (ref 32–48)
pH, Arterial: 7.334 — ABNORMAL LOW (ref 7.35–7.45)
pO2, Arterial: 123 mmHg — ABNORMAL HIGH (ref 83–108)

## 2022-12-21 LAB — BASIC METABOLIC PANEL
Anion gap: 13 (ref 5–15)
Anion gap: 14 (ref 5–15)
BUN: 37 mg/dL — ABNORMAL HIGH (ref 8–23)
BUN: 34 mg/dL — ABNORMAL HIGH (ref 8–23)
CO2: 23 mmol/L (ref 22–32)
CO2: 21 mmol/L — ABNORMAL LOW (ref 22–32)
Calcium: 8.1 mg/dL — ABNORMAL LOW (ref 8.9–10.3)
Calcium: 7.7 mg/dL — ABNORMAL LOW (ref 8.9–10.3)
Chloride: 101 mmol/L (ref 98–111)
Chloride: 101 mmol/L (ref 98–111)
Creatinine, Ser: 2.89 mg/dL — ABNORMAL HIGH (ref 0.61–1.24)
Creatinine, Ser: 2.9 mg/dL — ABNORMAL HIGH (ref 0.61–1.24)
GFR, Estimated: 23 mL/min — ABNORMAL LOW (ref >60)
GFR, Estimated: 23 mL/min — ABNORMAL LOW (ref >60)
Glucose, Bld: 104 mg/dL — ABNORMAL HIGH (ref 70–99)
Glucose, Bld: 128 mg/dL — ABNORMAL HIGH (ref 70–99)
Potassium: 3.3 mmol/L — ABNORMAL LOW (ref 3.5–5.1)
Potassium: 3.2 mmol/L — ABNORMAL LOW (ref 3.5–5.1)
Sodium: 137 mmol/L (ref 135–145)
Sodium: 136 mmol/L (ref 135–145)

## 2022-12-21 LAB — POCT ACTIVATED CLOTTING TIME
Activated Clotting Time: 152 s
Activated Clotting Time: 195 s
Activated Clotting Time: 232 s
Activated Clotting Time: 238 s
Activated Clotting Time: 238 s
Activated Clotting Time: 226 s

## 2022-12-21 LAB — PREPARE RBC (CROSSMATCH)

## 2022-12-21 LAB — SURGICAL PCR SCREEN
MRSA, PCR: NEGATIVE
Staphylococcus aureus: NEGATIVE

## 2022-12-21 LAB — HEPARIN LEVEL (UNFRACTIONATED)
Heparin Unfractionated: 0.14 [IU]/mL — ABNORMAL LOW (ref 0.30–0.70)
Heparin Unfractionated: 0.13 [IU]/mL — ABNORMAL LOW (ref 0.30–0.70)

## 2022-12-21 LAB — MAGNESIUM
Magnesium: 1.6 mg/dL — ABNORMAL LOW (ref 1.7–2.4)
Magnesium: 1.4 mg/dL — ABNORMAL LOW (ref 1.7–2.4)

## 2022-12-21 LAB — LACTATE DEHYDROGENASE: LDH: 160 U/L (ref 98–192)

## 2022-12-21 LAB — PHOSPHORUS: Phosphorus: 4.2 mg/dL (ref 2.5–4.6)

## 2022-12-21 SURGERY — INSERTION, STENT, ARTERY, ILIAC
Anesthesia: General | Site: Groin

## 2022-12-21 MED ORDER — CEFAZOLIN SODIUM 1 G IJ SOLR
INTRAMUSCULAR | Status: AC
  Filled 2022-12-21: qty 20

## 2022-12-21 MED ORDER — HEPARIN SODIUM (PORCINE) 1000 UNIT/ML IJ SOLN
INTRAMUSCULAR | Status: AC
  Filled 2022-12-21: qty 20

## 2022-12-21 MED ORDER — FENTANYL CITRATE (PF) 250 MCG/5ML IJ SOLN
INTRAMUSCULAR | Status: AC
  Filled 2022-12-21: qty 5

## 2022-12-21 MED ORDER — FENTANYL BOLUS VIA INFUSION
25.0000 ug | INTRAVENOUS | Status: DC | PRN
  Administered 2022-12-23 (×3): 100 ug via INTRAVENOUS
  Administered 2022-12-23 – 2022-12-25 (×6): 50 ug via INTRAVENOUS

## 2022-12-21 MED ORDER — POTASSIUM CHLORIDE 20 MEQ PO PACK: 40.0000 meq | PACK | Freq: Once | ORAL | Status: DC

## 2022-12-21 MED ORDER — IOHEXOL 350 MG/ML SOLN
75.0000 mL | Freq: Once | INTRAVENOUS | Status: AC | PRN
  Administered 2022-12-21: 75 mL via INTRAVENOUS

## 2022-12-21 MED ORDER — PROPOFOL 500 MG/50ML IV EMUL
INTRAVENOUS | Status: DC | PRN
Start: 2022-12-21 — End: 2022-12-21
  Administered 2022-12-21: 50 ug/kg/min via INTRAVENOUS

## 2022-12-21 MED ORDER — HEPARIN BOLUS VIA INFUSION
1500.0000 [IU] | Freq: Once | INTRAVENOUS | Status: AC
  Administered 2022-12-21: 1500 [IU] via INTRAVENOUS
  Filled 2022-12-21: qty 1500

## 2022-12-21 MED ORDER — PROPOFOL 10 MG/ML IV BOLUS
INTRAVENOUS | Status: AC
  Filled 2022-12-21: qty 20

## 2022-12-21 MED ORDER — FAMOTIDINE 20 MG PO TABS: 20.0000 mg | ORAL_TABLET | Freq: Two times a day (BID) | ORAL | Status: DC

## 2022-12-21 MED ORDER — CALCIUM GLUCONATE-NACL 2-0.675 GM/100ML-% IV SOLN
2.0000 g | Freq: Once | INTRAVENOUS | Status: AC
  Administered 2022-12-22: 2000 mg via INTRAVENOUS
  Filled 2022-12-21: qty 100

## 2022-12-21 MED ORDER — CHLORHEXIDINE GLUCONATE CLOTH 2 % EX PADS
6.0000 | MEDICATED_PAD | Freq: Every day | CUTANEOUS | Status: DC
  Administered 2022-12-22 – 2023-01-12 (×22): 6 via TOPICAL

## 2022-12-21 MED ORDER — PROPOFOL 10 MG/ML IV BOLUS
INTRAVENOUS | Status: DC | PRN
  Administered 2022-12-21: 50 mg via INTRAVENOUS
  Administered 2022-12-21: 150 mg via INTRAVENOUS

## 2022-12-21 MED ORDER — PHENYLEPHRINE HCL-NACL 20-0.9 MG/250ML-% IV SOLN
INTRAVENOUS | Status: DC | PRN
  Administered 2022-12-21: 30 ug/min via INTRAVENOUS

## 2022-12-21 MED ORDER — PANTOPRAZOLE SODIUM 40 MG IV SOLR
40.0000 mg | Freq: Every day | INTRAVENOUS | Status: DC
  Administered 2022-12-21 – 2022-12-28 (×8): 40 mg via INTRAVENOUS
  Filled 2022-12-21 (×8): qty 10

## 2022-12-21 MED ORDER — MAGNESIUM SULFATE 2 GM/50ML IV SOLN
2.0000 g | Freq: Once | INTRAVENOUS | Status: AC
  Administered 2022-12-22: 2 g via INTRAVENOUS
  Filled 2022-12-21: qty 50

## 2022-12-21 MED ORDER — LABETALOL HCL 5 MG/ML IV SOLN
INTRAVENOUS | Status: DC | PRN
  Administered 2022-12-21: 5 mg via INTRAVENOUS

## 2022-12-21 MED ORDER — IODIXANOL 320 MG/ML IV SOLN
INTRAVENOUS | Status: DC | PRN
  Administered 2022-12-21: 100 mL via INTRA_ARTERIAL
  Administered 2022-12-21: 150 mL via INTRA_ARTERIAL
  Administered 2022-12-21: 85 mL via INTRA_ARTERIAL

## 2022-12-21 MED ORDER — HEPARIN 6000 UNIT IRRIGATION SOLUTION
Status: DC | PRN
  Administered 2022-12-21 (×2): 1

## 2022-12-21 MED ORDER — BUPIVACAINE-EPINEPHRINE (PF) 0.25% -1:200000 IJ SOLN
INTRAMUSCULAR | Status: AC
  Filled 2022-12-21: qty 30

## 2022-12-21 MED ORDER — HEPARIN BOLUS VIA INFUSION
2700.0000 [IU] | Freq: Once | INTRAVENOUS | Status: AC
  Administered 2022-12-21: 2700 [IU] via INTRAVENOUS
  Filled 2022-12-21: qty 2700

## 2022-12-21 MED ORDER — HEPARIN SODIUM (PORCINE) 1000 UNIT/ML IJ SOLN
INTRAMUSCULAR | Status: AC
  Filled 2022-12-21: qty 1

## 2022-12-21 MED ORDER — MIDAZOLAM HCL 2 MG/2ML IJ SOLN
1.0000 mg | INTRAMUSCULAR | Status: DC | PRN
  Administered 2022-12-22 – 2022-12-23 (×4): 2 mg via INTRAVENOUS
  Filled 2022-12-21 (×4): qty 2

## 2022-12-21 MED ORDER — 0.9 % SODIUM CHLORIDE (POUR BTL) OPTIME
TOPICAL | Status: DC | PRN
  Administered 2022-12-21: 1000 mL
  Administered 2022-12-21: 2000 mL

## 2022-12-21 MED ORDER — HEPARIN 6000 UNIT IRRIGATION SOLUTION
Status: AC
  Filled 2022-12-21: qty 500

## 2022-12-21 MED ORDER — FENTANYL CITRATE (PF) 250 MCG/5ML IJ SOLN
INTRAMUSCULAR | Status: DC | PRN
  Administered 2022-12-21 (×2): 50 ug via INTRAVENOUS
  Administered 2022-12-21: 150 ug via INTRAVENOUS
  Administered 2022-12-21: 50 ug via INTRAVENOUS
  Administered 2022-12-21: 100 ug via INTRAVENOUS
  Administered 2022-12-21 (×2): 50 ug via INTRAVENOUS

## 2022-12-21 MED ORDER — LACTATED RINGERS IV SOLN: INTRAVENOUS | Status: DC

## 2022-12-21 MED ORDER — ROCURONIUM BROMIDE 10 MG/ML (PF) SYRINGE
PREFILLED_SYRINGE | INTRAVENOUS | Status: DC | PRN
  Administered 2022-12-21: 20 mg via INTRAVENOUS
  Administered 2022-12-21: 50 mg via INTRAVENOUS
  Administered 2022-12-21: 60 mg via INTRAVENOUS
  Administered 2022-12-21: 30 mg via INTRAVENOUS
  Administered 2022-12-21: 40 mg via INTRAVENOUS

## 2022-12-21 MED ORDER — HEPARIN (PORCINE) 25000 UT/250ML-% IV SOLN
2150.0000 [IU]/h | INTRAVENOUS | Status: AC
  Administered 2022-12-22: 2150 [IU]/h via INTRAVENOUS
  Administered 2022-12-22: 2000 [IU]/h via INTRAVENOUS
  Filled 2022-12-21 (×2): qty 250

## 2022-12-21 MED ORDER — ORAL CARE MOUTH RINSE: 15.0000 mL | Freq: Once | OROMUCOSAL | Status: AC

## 2022-12-21 MED ORDER — ALBUMIN HUMAN 5 % IV SOLN
INTRAVENOUS | Status: DC | PRN
Start: 2022-12-21 — End: 2022-12-21

## 2022-12-21 MED ORDER — STERILE WATER FOR IRRIGATION IR SOLN
Status: DC | PRN
  Administered 2022-12-21: 1000 mL

## 2022-12-21 MED ORDER — HEMOSTATIC AGENTS (NO CHARGE) OPTIME
TOPICAL | Status: DC | PRN
Start: 2022-12-21 — End: 2022-12-21
  Administered 2022-12-21: 1 via TOPICAL

## 2022-12-21 MED ORDER — PIPERACILLIN-TAZOBACTAM 3.375 G IVPB
3.3750 g | Freq: Three times a day (TID) | INTRAVENOUS | Status: DC
  Administered 2022-12-21 – 2022-12-23 (×4): 3.375 g via INTRAVENOUS
  Filled 2022-12-21 (×4): qty 50

## 2022-12-21 MED ORDER — DEXAMETHASONE SODIUM PHOSPHATE 10 MG/ML IJ SOLN
INTRAMUSCULAR | Status: DC | PRN
  Administered 2022-12-21: 5 mg via INTRAVENOUS

## 2022-12-21 MED ORDER — CHLORHEXIDINE GLUCONATE 0.12 % MT SOLN: 15.0000 mL | Freq: Once | OROMUCOSAL | Status: AC

## 2022-12-21 MED ORDER — LIDOCAINE 2% (20 MG/ML) 5 ML SYRINGE
INTRAMUSCULAR | Status: DC | PRN
  Administered 2022-12-21: 60 mg via INTRAVENOUS

## 2022-12-21 MED ORDER — CHLORHEXIDINE GLUCONATE 0.12 % MT SOLN
OROMUCOSAL | Status: AC
  Administered 2022-12-21: 15 mL via OROMUCOSAL
  Filled 2022-12-21: qty 15

## 2022-12-21 MED ORDER — FENTANYL 2500MCG IN NS 250ML (10MCG/ML) PREMIX INFUSION
25.0000 ug/h | INTRAVENOUS | Status: DC
  Administered 2022-12-21: 25 ug/h via INTRAVENOUS
  Administered 2022-12-22: 100 ug/h via INTRAVENOUS
  Administered 2022-12-23: 300 ug/h via INTRAVENOUS
  Administered 2022-12-24: 100 ug/h via INTRAVENOUS
  Administered 2022-12-25: 200 ug/h via INTRAVENOUS
  Administered 2022-12-25: 175 ug/h via INTRAVENOUS
  Filled 2022-12-21 (×6): qty 250

## 2022-12-21 MED ORDER — CEFAZOLIN SODIUM-DEXTROSE 1-4 GM/50ML-% IV SOLN
INTRAVENOUS | Status: DC | PRN
Start: 2022-12-21 — End: 2022-12-21
  Administered 2022-12-21: 2 g via INTRAVENOUS

## 2022-12-21 MED ORDER — HEPARIN SODIUM (PORCINE) 1000 UNIT/ML IJ SOLN
INTRAMUSCULAR | Status: DC | PRN
Start: 2022-12-21 — End: 2022-12-21
  Administered 2022-12-21: 5000 [IU] via INTRAVENOUS
  Administered 2022-12-21 (×2): 3000 [IU] via INTRAVENOUS
  Administered 2022-12-21 (×3): 5000 [IU] via INTRAVENOUS

## 2022-12-21 MED ORDER — FENTANYL CITRATE PF 50 MCG/ML IJ SOSY
PREFILLED_SYRINGE | INTRAMUSCULAR | Status: AC
  Administered 2022-12-21: 100 ug via INTRAVENOUS
  Filled 2022-12-21: qty 2

## 2022-12-21 MED ORDER — PROPOFOL 1000 MG/100ML IV EMUL
0.0000 ug/kg/min | INTRAVENOUS | Status: DC
  Administered 2022-12-22: 30 ug/kg/min via INTRAVENOUS
  Administered 2022-12-22 (×2): 35 ug/kg/min via INTRAVENOUS
  Administered 2022-12-22: 45 ug/kg/min via INTRAVENOUS
  Administered 2022-12-22 (×2): 50 ug/kg/min via INTRAVENOUS
  Administered 2022-12-23: 45 ug/kg/min via INTRAVENOUS
  Administered 2022-12-23: 25 ug/kg/min via INTRAVENOUS
  Administered 2022-12-23: 30 ug/kg/min via INTRAVENOUS
  Administered 2022-12-23: 60 mg via INTRAVENOUS
  Administered 2022-12-23 (×2): 30 ug/kg/min via INTRAVENOUS
  Administered 2022-12-24: 25 ug/kg/min via INTRAVENOUS
  Administered 2022-12-24: 30 ug/kg/min via INTRAVENOUS
  Administered 2022-12-24: 20 ug/kg/min via INTRAVENOUS
  Administered 2022-12-24: 30 ug/kg/min via INTRAVENOUS
  Administered 2022-12-24: 25 ug/kg/min via INTRAVENOUS
  Administered 2022-12-25: 40 ug/kg/min via INTRAVENOUS
  Administered 2022-12-25: 30 ug/kg/min via INTRAVENOUS
  Filled 2022-12-21 (×5): qty 100
  Filled 2022-12-21: qty 200
  Filled 2022-12-21 (×7): qty 100

## 2022-12-21 MED ORDER — ALTEPLASE 1 MG/ML SYRINGE FOR VASCULAR PROCEDURE
10.0000 mg | Freq: Once | INTRAMUSCULAR | Status: AC
  Administered 2022-12-21: 10 mg via INTRA_ARTERIAL
  Filled 2022-12-21: qty 10

## 2022-12-21 MED ORDER — FENTANYL CITRATE PF 50 MCG/ML IJ SOSY
100.0000 ug | PREFILLED_SYRINGE | Freq: Once | INTRAMUSCULAR | Status: AC
  Filled 2022-12-21: qty 2

## 2022-12-21 MED ORDER — MORPHINE SULFATE (PF) 4 MG/ML IV SOLN
3.0000 mg | INTRAVENOUS | Status: DC | PRN
  Administered 2022-12-21 (×2): 3 mg via INTRAVENOUS
  Filled 2022-12-21 (×2): qty 1

## 2022-12-21 SURGICAL SUPPLY — 96 items
APL PRP STRL LF DISP 70% ISPRP (MISCELLANEOUS) ×5
BAG BANDED W/RUBBER/TAPE 36X54 (MISCELLANEOUS) ×1 IMPLANT
BAG COUNTER SPONGE SURGICOUNT (BAG) ×5 IMPLANT
BAG EQP BAND 135X91 W/RBR TAPE (MISCELLANEOUS) ×5
BAG SPNG CNTER NS LX DISP (BAG) ×5
BALLN STERLING OTW 2.5X150X150 (BALLOONS) ×5
BALLN STERLING OTW 2X150X150 (BALLOONS) ×5
BALLN STERLING OTW 3X60X150 (BALLOONS) ×5
BALLN STERLING RX 3X40X135 (BALLOONS) ×5
BALLOON STERLING OTW 2X150X150 (BALLOONS) ×1 IMPLANT
BALLOON STERLING OTW 3X60X150 (BALLOONS) ×1 IMPLANT
BALLOON STERLING RX 3X40X135 (BALLOONS) ×1 IMPLANT
BALLOON STRLNG OTW 2.5X150X150 (BALLOONS) ×1 IMPLANT
CANISTER PENUMBRA ENGINE (MISCELLANEOUS) ×1 IMPLANT
CANISTER SUCT 3000ML PPV (MISCELLANEOUS) ×5 IMPLANT
CANISTER WOUND CARE 500ML ATS (WOUND CARE) ×1 IMPLANT
CATH BEACON 5 .035 65 C1 TIP (CATHETERS) ×1 IMPLANT
CATH LIGHTNING 7 XTORQ 130 (CATHETERS) ×1 IMPLANT
CATH LIGHTNING BOLT 7 130 (CATHETERS) ×1 IMPLANT
CATH OMNI FLUSH 5F 65CM (CATHETERS) ×6 IMPLANT
CATH QUICKCROSS .018X135CM (MICROCATHETER) ×1 IMPLANT
CATH QUICKCROSS SUPP .035X90CM (MICROCATHETER) ×1 IMPLANT
CHLORAPREP W/TINT 26 (MISCELLANEOUS) ×5 IMPLANT
CLOSURE PERCLOSE PROSTYLE (VASCULAR PRODUCTS) ×1 IMPLANT
CNTNR URN SCR LID CUP LEK RST (MISCELLANEOUS) ×2 IMPLANT
CONT SPEC 4OZ STRL OR WHT (MISCELLANEOUS) ×10
COVER PROBE W GEL 5X96 (DRAPES) ×5 IMPLANT
COVER SURGICAL LIGHT HANDLE (MISCELLANEOUS) ×1 IMPLANT
DEVICE TORQUE KENDALL .025-038 (MISCELLANEOUS) ×1 IMPLANT
DRAPE HALF SHEET 40X57 (DRAPES) ×1 IMPLANT
ELECT REM PT RETURN 9FT ADLT (ELECTROSURGICAL)
ELECTRODE REM PT RTRN 9FT ADLT (ELECTROSURGICAL) IMPLANT
FILTER CO2 0.2 MICRON (VASCULAR PRODUCTS) IMPLANT
FILTER CO2 INSUFFLATOR AX1008 (MISCELLANEOUS) IMPLANT
GAUZE 4X4 16PLY ~~LOC~~+RFID DBL (SPONGE) ×1 IMPLANT
GLIDEWIRE ADV .035X260CM (WIRE) ×1 IMPLANT
GLOVE INDICATOR 8.0 STRL GRN (GLOVE) ×5 IMPLANT
GOWN STRL REUS W/TWL 2XL LVL3 (GOWN DISPOSABLE) ×10 IMPLANT
GUIDEWIRE ANGLED .035X150CM (WIRE) IMPLANT
GUIDEWIRE ANGLED .035X260CM (WIRE) ×1 IMPLANT
GUIDEWIRE STR TIP .014X300X8 (WIRE) ×2 IMPLANT
HANDLE YANKAUER SUCT OPEN TIP (MISCELLANEOUS) ×1 IMPLANT
HEMOSTAT SNOW SURGICEL 2X4 (HEMOSTASIS) ×1 IMPLANT
IRRIG SUCT STRYKERFLOW 2 WTIP (MISCELLANEOUS) ×5
IRRIGATION SUCT STRKRFLW 2 WTP (MISCELLANEOUS) ×1 IMPLANT
KIT BASIN OR (CUSTOM PROCEDURE TRAY) ×5 IMPLANT
KIT ENCORE 26 ADVANTAGE (KITS) ×1 IMPLANT
KIT TURNOVER KIT B (KITS) ×5 IMPLANT
NDL PERC 18GX7CM (NEEDLE) ×4 IMPLANT
NEEDLE PERC 18GX7CM (NEEDLE) ×5 IMPLANT
NS IRRIG 1000ML POUR BTL (IV SOLUTION) ×5 IMPLANT
PACK ENDO MINOR (CUSTOM PROCEDURE TRAY) ×5 IMPLANT
PACK UNIVERSAL I (CUSTOM PROCEDURE TRAY) ×1 IMPLANT
PAD ARMBOARD 7.5X6 YLW CONV (MISCELLANEOUS) ×10 IMPLANT
PENCIL SMOKE EVACUATOR (MISCELLANEOUS) ×1 IMPLANT
PROTECTION STATION PRESSURIZED (MISCELLANEOUS) ×5
RELOAD PROXIMATE 75MM BLUE (ENDOMECHANICALS) ×5 IMPLANT
RELOAD STAPLE 75 3.8 BLU REG (ENDOMECHANICALS) ×1 IMPLANT
SET FLUSH CO2 (MISCELLANEOUS) IMPLANT
SET MICROPUNCTURE 5F STIFF (MISCELLANEOUS) ×1 IMPLANT
SET TUBE SMOKE EVAC HIGH FLOW (TUBING) ×1 IMPLANT
SHEATH APTUS 7.0FR 180D 55 (SHEATH) ×1 IMPLANT
SHEATH PINNACLE 5F 10CM (SHEATH) ×1 IMPLANT
SHEATH PINNACLE 8F 10CM (SHEATH) ×1 IMPLANT
SLEEVE Z-THREAD 5X100MM (TROCAR) ×1 IMPLANT
SPONGE ABDOMINAL VAC ABTHERA (MISCELLANEOUS) ×1 IMPLANT
STAPLER PROXIMATE 75MM BLUE (STAPLE) ×1 IMPLANT
STAPLER VISISTAT 35W (STAPLE) ×1 IMPLANT
STATION PROTECTION PRESSURIZED (MISCELLANEOUS) ×1 IMPLANT
STENT SYNERGY XD 3.50X48 (Permanent Stent) ×1 IMPLANT
STENT VIABAHN 39XCATH 135 (Permanent Stent) ×1 IMPLANT
STENT VIABAHN 8X39 7FR 135 (Permanent Stent) ×5 IMPLANT
STOPCOCK MORSE 400PSI 3WAY (MISCELLANEOUS) ×1 IMPLANT
SUT SILK 2 0 (SUTURE) ×5
SUT SILK 2 0 SH (SUTURE) ×1 IMPLANT
SUT SILK 2 0 SH CR/8 (SUTURE) ×1 IMPLANT
SUT SILK 2-0 18XBRD TIE 12 (SUTURE) ×1 IMPLANT
SUT SILK 3 0 (SUTURE) ×5
SUT SILK 3-0 18XBRD TIE 12 (SUTURE) ×1 IMPLANT
SYNERGY XD 3.50X48 (Permanent Stent) ×5 IMPLANT
SYR 20ML LL LF (SYRINGE) ×13 IMPLANT
SYR 3ML LL SCALE MARK (SYRINGE) ×1 IMPLANT
SYR BULB IRRIG 60ML STRL (SYRINGE) ×1 IMPLANT
SYR MEDRAD MARK V 150ML (SYRINGE) ×1 IMPLANT
TOWEL GREEN STERILE (TOWEL DISPOSABLE) ×10 IMPLANT
TRAY LAPAROSCOPIC MC (CUSTOM PROCEDURE TRAY) ×1 IMPLANT
TROCAR BALLN 12MMX100 BLUNT (TROCAR) ×1 IMPLANT
TROCAR Z-THREAD OPTICAL 5X100M (TROCAR) ×1 IMPLANT
TUBE CONNECTING 20X1/4 (TUBING) ×1 IMPLANT
TUBING HIGH PRESSURE 120CM (CONNECTOR) IMPLANT
TUBING INJECTOR 48 (MISCELLANEOUS) ×1 IMPLANT
WATER STERILE IRR 1000ML POUR (IV SOLUTION) IMPLANT
WIRE BENTSON .035X145CM (WIRE) ×5 IMPLANT
WIRE G V18X300 ST (WIRE) ×3 IMPLANT
WIRE ROSEN-J .035X260CM (WIRE) ×1 IMPLANT
WIRE SPARTACORE .014X300CM (WIRE) ×1 IMPLANT

## 2022-12-21 NOTE — Progress Notes (Signed)
Patient ID: [name] [name], male   DOB: 1955/10/09, 67 y.o.   MRN: [id number] I agree with Dr. Eliot Ford assessment and recommendation. Patient seen in pre-op holding and I D/W Dr. Karin Lieu at the bedside. Plan diagnostic laparoscopy, possible bowel resection. I spoke with him and his wife about the procedure, risks, and benefits and that he may need more than one operation for this. He agrees.  Violeta Gelinas, MD, MPH, FACS Please use [email or web address] to contact on call provider

## 2022-12-21 NOTE — Progress Notes (Signed)
ANTICOAGULATION CONSULT NOTE - Follow Up  Pharmacy Consult for heparin Indication: SMA stenosis/occlusion  Allergies  Allergen Reactions   Zestril [Lisinopril] Cough    Patient Measurements: Height: 5\' 11"  (180.3 cm) Weight: 98.6 kg (217 lb 6 oz) IBW/kg (Calculated) : 75.3 Heparin Dosing Weight: 94.6kg  Vital Signs: Temp: 98.8 F (37.1 C) (09/12 1610) Temp Source: Oral (09/12 1610) BP: 158/80 (09/12 1610) Pulse Rate: 90 (09/12 1610)  Labs: Recent Labs    12/20/22 1441 12/20/22 2318 12/21/22 0803 12/21/22 1452  HGB 13.4 13.4 11.7*  --   HCT 41.8 42.0 37.0*  --   PLT 282 207 227  --   HEPARINUNFRC  --  <0.10* 0.14* 0.13*  CREATININE 3.00* 2.90*  --   --     Estimated Creatinine Clearance: 29.2 mL/min (A) (by C-G formula based on SCr of 2.9 mg/dL (H)).   Assessment: 35 YOM presenting with abdominal pain, SMA stenosis vs. Occlusion, he is not on anticoagulation PTA.  HL 0.13- subtherapeutic, per RN a little trouble with that IV line, Hgb 11.7, Plt 227 No s/sx of bleeding or issues with infusion per RN   Goal of Therapy:  Heparin level 0.3-0.7 units/ml Monitor platelets by anticoagulation protocol: Yes   Plan:  Increase IV heparin to 2200 units/hr Recheck heparin level in 8 hrs. F/u plans for heparin after trip to OR this afternoon.  Reece Leader, Colon Flattery, BCCP Clinical Pharmacist  12/21/2022 4:30 PM   [facility] pharmacy phone numbers are listed on [email or web address]

## 2022-12-21 NOTE — Progress Notes (Signed)
Pharmacy Antibiotic Note  [name] [name] is a 67 y.o. male admitted on 12/20/2022 with open abdomen in discontinuity.  Pharmacy has been consulted for zosyn dosing. Patient underwent SMA stent and found to have necrotic area of the jejunum, now left with open abdomen.  WBC 16.6, sCr 2.89 (bl~1.5), afebrile  Plan: Zosyn 3.375gm IV every 8 hours Monitor renal function Follow up LOT, closing of abdomen, signs of clinical improvement  Height: 5\' 11"  (180.3 cm) Weight: 98.6 kg (217 lb 6 oz) IBW/kg (Calculated) : 75.3  Temp (24hrs), Avg:98.9 F (37.2 C), Min:98.1 F (36.7 C), Max:99.9 F (37.7 C)  Recent Labs  Lab 12/20/22 1441 12/20/22 2318 12/21/22 0803 12/21/22 1452 12/21/22 2228  WBC 12.8* 13.6* 14.1*  --  16.6*  CREATININE 3.00* 2.90*  --  2.89*  --     Estimated Creatinine Clearance: 29.3 mL/min (A) (by C-G formula based on SCr of 2.89 mg/dL (H)).    Allergies  Allergen Reactions   Zestril [Lisinopril] Cough    Antimicrobials this admission: Zosyn 9/13 >>   Thank you for allowing pharmacy to be a part of this patient's care.  Arabella Merles, PharmD. Clinical Pharmacist 12/21/2022 11:31 PM

## 2022-12-21 NOTE — Op Note (Addendum)
NAME: [name] [name]    MRN: [id number] DOB: [birth year]    DATE OF OPERATION: 12/21/2022  PREOP DIAGNOSIS:    Acute on chronic mesenteric ischemia  POSTOP DIAGNOSIS:    Same  PROCEDURE:    Ultrasound-guided micropuncture access of the left common femoral artery Aortogram Selective angiography of the superior mesenteric artery Percutaneous suction thrombectomy using 7 French lightening penumbra system to the SMA Balloon angioplasty of the SMA 2.5 x 150, 3 x 60 mm Stenting of the SMA-proximally 8 x 39 VBX, distally 3.5 x 48 Synergy  Please see Dr. Carollee Massed dictation regarding: Exploratory laparotomy, small bowel resection, open abdomen  SURGEON: Victorino Sparrow Co-surgeon: Violeta Gelinas, MD ASSIST: Heath Lark, MD  ANESTHESIA: General  EBL: 200 mL  INDICATIONS:    [name] [name] is a 67 y.o. male presenting with a 3-week history of abdominal pain of unknown etiology.  At the time of new onset abdominal pain, he also noted new onset claudication involving the right buttock.  I question whether [name] had embolic event leading to further SMA stenosis versus occlusion as well as right-sided iliac stenosis versus occlusion.  On initial presentation in the ED, he was somewhat asymptomatic.  I elected to place him on a heparin drip and attempted to improve his creatinine prior to CT angio.  Unfortunately, [name] symptoms worsened throughout the day.  He was noted to be tender, which was a change in exam.  After discussing the risks and benefits, we moved forward with CT angiogram which demonstrated some concern for bowel ischemia, as well as occlusion of the distal SMA.  We had a detailed discussion regarding SMA revascularization in an effort to alleviate mesenteric angina, furthermore we talked about open the abdomen to ensure he did not have ischemic changes that required resection.  After assessing risks and benefits, [name] elected to proceed.    FINDINGS:   Aortogram: Patent  celiac, SMA, bilateral renal arteries Branches along the SMA: Middle colic artery not appreciated, large jejunal artery which collateralized to the ileocolic and right colic arteries the superior mesenteric artery was occluded after roughly 5 cm with no flow appreciated in many of the jejunal, and all of the ileal arteries. On exploratory laparotomy Patchy necrosis of the small bowel,  TECHNIQUE:   Patient was brought to the OR laid in supine position.  General anesthesia was induced and the patient was prepped and draped in standard fashion.  The case began with ultrasound insonation of the left common femoral artery.  The artery was accessed in retrograde fashion, and a 5 French sheath was placed.  Next, aortic angiography followed.  See results above.  This occurred in multiple projections including AP and lateral.  From a lateral approach, I was able to use a Cobra 1 catheter to select the superior mesenteric artery.  Selective angiography of the superior mesenteric artery followed.  See results above.  With the poor flow to the mesentery, I elected to attempt intervention to recanalize the distal superior mesenteric artery as well as some of its branches.  The patient was heparinized and a 7 French steerable sheath was brought onto the field and parked in the superior mesenteric artery.  Next, a series of wires and catheters were used in an attempt to recanalize the distal superior mesenteric artery.  This took a considerable amount of time and effort.  There was a top That was very difficult to advance through as well as dense calcification.  Once the wire  traveled distally, I confirmed true lumen with angiography.  Next, I elected to attempt to suction thrombectomy.  The 7 [place] lightning system was brought onto the field and engaged in the blockage.  Several attempts were made in an effort to thrombectomize the superior mesenteric artery, however the catheter would not pass through the lesion.  The  lack of push ability was concerning that the lesion had significant chronicity.  Next, I moved to balloon angioplasty, and elected to use initially a 2.5 x 150 mm balloon followed by a 3 mm x 60 mm balloon.  There was significant wasting at the proximal portion of the lesion immediately distal to the ileocolic artery.  I had 2 balloons rupture with inflation due to the significant amount of calcific disease.  At this point, I discussed further care with my partner Dr. Lenell Antu.  Evaluating Jim's CT, I thought he was a poor open surgical revascularization candidate, and being that the lesion was refractory to balloon angioplasty, I felt the majority of the lesion was chronic, and would not improve with open embolectomy.  Open bypass surgery was not an option as there is no outflow distally.  I elected to continue the case endovascularly and elected to stent the refractory lesion.  For any acute component, I injected 10 mg of tPA directly into the superior mesenteric artery below the chronic lesion.  A 3.5 x 48 millimeter Synergy stent was brought onto the field positioned and inflated distal to the ileocolic artery.  Follow-up angiography demonstrated excellent result with filling of some jejunal and ileal arteries.  I thought this was an excellent result, however also understood that the durability of mesenteric stenting is poor.  There was significant inflow disease as well which appeared to be mural thrombus.  I chose to use an 8 x 39 mm Gore VBX stent which was positioned deployed in standard fashion.   Follow-up angiography demonstrated excellent result with brisk filling of the SMA.  Several branches were still occluded, however there was distal flow in some jejunal and ileal arterial branches.  At this point, wires and sheaths were removed and the left common femoral arteriotomy was closed using a ProGlide device.  Please see my colleague Dr. Carollee Massed note regarding ex lap and findings associated. Patient  was left open for second look in the coming days.  Best representative runs of before and after intervention - Pre- 4,5 and Post -34  Victorino Sparrow, MD [facility] DATE OF DICTATION:   12/21/2022

## 2022-12-21 NOTE — Brief Op Note (Signed)
12/21/2022  9:48 PM  PATIENT:  [name]  67 y.o. male  PRE-OPERATIVE DIAGNOSIS:  Acute mesentaric ischemia  POST-OPERATIVE DIAGNOSIS:  Acute mesentaric ischemia  PROCEDURE:  Procedure(s): POSSIBLE EXPLORATION LAPAROTOMY (N/A) Mesentaric angeogram possible stenting (N/A) INSERTION OF SUPERIOR MESENTERIC ARTERY STENT (N/A)  SURGEON:  Surgeons and Role:    * Victorino Sparrow, MD - Primary    * Violeta Gelinas, MD - Assisting    * Lenell Antu [name], MD - Assisting  PHYSICIAN ASSISTANT: n/a  ANESTHESIA:   general  EBL:  10 mL   BLOOD ADMINISTERED:none  DRAINS:  AbTHera    LOCAL MEDICATIONS USED:  NONE  SPECIMEN:  Source of Specimen:  small bowel  DISPOSITION OF SPECIMEN:  PATHOLOGY  COUNTS:  YES  TOURNIQUET:  * No tourniquets in log *  DICTATION: .Dragon Dictation  PLAN OF CARE:  ICU  PATIENT DISPOSITION:  ICU - intubated and critically ill.   Delay start of Pharmacological VTE agent (>24hrs) due to surgical blood loss or risk of bleeding: no

## 2022-12-21 NOTE — Progress Notes (Signed)
Patient complains of increasing pain on his abldomen. MD paged. New order given.

## 2022-12-21 NOTE — Consult Note (Signed)
NAME:  [name] [name], MRN:  [id number], DOB:  Nov 12, 1955, LOS: 1 ADMISSION DATE:  12/20/2022, CONSULTATION DATE: 9/12 REFERRING MD: Dr. Jacqulyn Bath, CHIEF COMPLAINT: Mesenteric ischemia  History of Present Illness:  Patient is encephalopathic and/or intubated; therefore, history has been obtained from chart review.  67 year old male with past medical history as below, which is significant for intestinal stromal tumor removal in 2022, known mesenteric stenosis, GERD, hypertension, and hyperlipidemia.  In the few weeks prior to presentation on 9/11 he had been evaluated at several campuses for abdominal pain with noncontrasted CTs of the abdomen which were unrevealing.  He continued to have abdominal pain, weight loss, and poor p.o. intake.  He presented [name] on 9/11 and was admitted to the hospitalist for abdominal pain with concern for superior mesenteric artery thrombus.  He was started on heparin infusion.  Vascular surgery was consulted and ultimately a CT angiogram of the abdomen and pelvis was done 9/12 demonstrating acute occlusion of the distal SMA as well as occlusion of the inferior mesenteric artery.  He was taken to the operating room emergently and and was found to have an necrotic area of the jejunum.  This was resected, and SMA stent was placed, and the patient was left with an open abdomen.  He also remained on the mechanical ventilator postoperatively and PCCM was asked to admit for ventilator management.  Pertinent  Medical History   has a past medical history of Ankle fracture, Cancer (HCC) (1974), Cataract, GERD (gastroesophageal reflux disease), colonic polyps, Hyperlipidemia, Hypertension, Kidney stones, Patella fracture, Retinal detachment, Retinal tear of right eye, and Syncopal episodes (2008).   Significant Hospital Events: Including procedures, antibiotic start and stop dates in addition to other pertinent events   9/11 admitted with abdominal pain 9/12 CT demonstrating  mesenteric occlusion, bowel resection and stent placed, to ICU on vent.  Interim History / Subjective:    Objective   Blood pressure (!) 158/80, pulse 90, temperature 98.8 F (37.1 C), temperature source Oral, resp. rate 17, height 5\' 11"  (1.803 m), weight 98.6 kg, SpO2 97%.        Intake/Output Summary (Last 24 hours) at 12/21/2022 2240 Last data filed at 12/21/2022 2205 Gross per 24 hour  Intake 4886.03 ml  Output 1560 ml  Net 3326.03 ml   Filed Weights   12/20/22 1320 12/21/22 0458  Weight: 95.7 kg 98.6 kg    Examination: General: Overweight middle-age male in no acute distress HENT: Normocephalic, atraumatic, PERRL, no JVD Lungs: Clear bilateral breath sounds Cardiovascular: Regular rate and rhythm, no MRG Abdomen: Longitudinal midline incision with wound VAC in place with minimal sanguinous drainage Extremities: No acute deformity, no edema Neuro: Sedated RASS -4   Resolved Hospital Problem list     Assessment & Plan:   Acute mesenteric ischemia: Status post small bowel resection and superior mesenteric artery stent placement 9/12.  Open abdomen. -Management per general surgery and vascular surgery -N.p.o. -Wound VAC -Will need to keep RASS -4 to -5 with open abdomen -RN to clarify heparin order with vascular surgery -Start empiric zosyn  Mechanical ventilator dependent for postoperative management of above -Full vent support -Chest x-ray for ETT placement -ABG -VAP bundle -Propofol and fentanyl infusions, as needed Versed for above RASS goal  AKI on CKD III Hypokalemia Hypomagnesemia  - Check chemistry now - Strict I&O  HLD - Holding crestor while NPO  Anemia - check CBC now  Protein calorie malnutrition : significant weight loss prior to this admission, now  NPO - Consider TPN early - At risk for re-feeding syndrome when able to be fed enterally.   Best Practice (right click and "Reselect all SmartList Selections" daily)   Diet/type: NPO DVT  prophylaxis: SCD for tonight. Heparin order being clarified.  GI prophylaxis: PPI Lines: Arterial Line Foley:  Yes, and it is still needed Code Status:  full code Last date of multidisciplinary goals of care discussion [ ]   Labs   CBC: Recent Labs  Lab 12/20/22 1441 12/20/22 2318 12/21/22 0803  WBC 12.8* 13.6* 14.1*  HGB 13.4 13.4 11.7*  HCT 41.8 42.0 37.0*  MCV 78.4* 81.2 78.2*  PLT 282 207 227    Basic Metabolic Panel: Recent Labs  Lab 12/20/22 1441 12/20/22 2318 12/21/22 1452  NA 138 138 137  K 3.8 3.2* 3.3*  CL 99 100 101  CO2 29 27 23   GLUCOSE 126* 134* 104*  BUN 36* 37* 37*  CREATININE 3.00* 2.90* 2.89*  CALCIUM 8.6* 8.6* 8.1*  MG  --  1.6*  --    GFR: Estimated Creatinine Clearance: 29.3 mL/min (A) (by C-G formula based on SCr of 2.89 mg/dL (H)). Recent Labs  Lab 12/20/22 1441 12/20/22 2318 12/21/22 0803  WBC 12.8* 13.6* 14.1*    Liver Function Tests: Recent Labs  Lab 12/20/22 1441 12/20/22 2318  AST 74* 65*  ALT 105* 95*  ALKPHOS 67 61  BILITOT 1.2 1.0  PROT 6.8 5.9*  ALBUMIN 3.1* 2.8*   No results for input(s): "LIPASE", "AMYLASE" in the last 168 hours. No results for input(s): "AMMONIA" in the last 168 hours.  ABG No results found for: "PHART", "PCO2ART", "PO2ART", "HCO3", "TCO2", "ACIDBASEDEF", "O2SAT"   Coagulation Profile: No results for input(s): "INR", "PROTIME" in the last 168 hours.  Cardiac Enzymes: No results for input(s): "CKTOTAL", "CKMB", "CKMBINDEX", "TROPONINI" in the last 168 hours.  HbA1C: Hgb A1c MFr Bld  Date/Time Value Ref Range Status  10/07/2020 11:19 AM 5.3 4.6 - 6.5 % Final    Comment:    Glycemic Control Guidelines for People with Diabetes:Non Diabetic:  <6%Goal of Therapy: <7%Additional Action Suggested:  >8%     CBG: No results for input(s): "GLUCAP" in the last 168 hours.  Review of Systems:   Patient is encephalopathic and/or intubated; therefore, history has been obtained from chart review.    Past Medical History:  He,  has a past medical history of Ankle fracture, Cancer (HCC) (1974), Cataract, GERD (gastroesophageal reflux disease), colonic polyps, Hyperlipidemia, Hypertension, Kidney stones, Patella fracture, Retinal detachment, Retinal tear of right eye, and Syncopal episodes (2008).   Surgical History:   Past Surgical History:  Procedure Laterality Date   AIR/FLUID EXCHANGE Right 06/16/2014   Procedure: AIR/FLUID EXCHANGE;  Surgeon: Sherrie George, MD;  Location: [facility] OR;  Service: Ophthalmology;  Laterality: Right;   BIOPSY  04/04/2021   Procedure: BIOPSY;  Surgeon: Meridee Score Netty Starring., MD;  Location: [facility] ENDOSCOPY;  Service: Gastroenterology;;   CATARACT EXTRACTION     CATARACT EXTRACTION  09/2014   rt eye//left 01/2015   CATARACT EXTRACTION, BILATERAL  2016   per Dr. Elmer Picker    COLONOSCOPY  02/16/2020   per Dr. Rhea Belton, adenomatous polyps, repeat in 3 yrs   ENTEROSCOPY N/A 04/04/2021   Procedure: ENTEROSCOPY;  Surgeon: Mansouraty, Netty Starring., MD;  Location: [facility] ENDOSCOPY;  Service: Gastroenterology;  Laterality: N/A;   EYE SURGERY     GAS INSERTION Right 06/16/2014   Procedure: INSERTION OF GAS;  Surgeon: Sherrie George, MD;  Location:  [facility] OR;  Service: Ophthalmology;  Laterality: Right;  [id number]   GIVENS CAPSULE STUDY N/A 04/02/2021   Procedure: GIVENS CAPSULE STUDY;  Surgeon: Lemar Lofty., MD;  Location: [facility] ENDOSCOPY;  Service: Gastroenterology;  Laterality: N/A;   HYDROCELE EXCISION     IR ANGIOGRAM VISCERAL SELECTIVE  04/04/2021   IR US GUIDE VASC ACCESS LEFT  04/04/2021   KNEE ARTHROSCOPY Right    x 2   LASER PHOTO ABLATION Right 06/16/2014   Procedure: LASER PHOTO ABLATION;  Surgeon: Sherrie George, MD;  Location: [facility] OR;  Service: Ophthalmology;  Laterality: Right;  Headscope laser and endolaser    ORCHIECTOMY     right testicle   REPAIR OF COMPLEX TRACTION RETINAL DETACHMENT Right 06/16/2014   Procedure: REPAIR OF COMPLEX TRACTION RETINAL  DETACHMENT;  Surgeon: Sherrie George, MD;  Location: [facility] OR;  Service: Ophthalmology;  Laterality: Right;   RETINAL DETACHMENT SURGERY  06/2014   SUBMUCOSAL TATTOO INJECTION  04/04/2021   Procedure: SUBMUCOSAL TATTOO INJECTION;  Surgeon: Lemar Lofty., MD;  Location: [facility] ENDOSCOPY;  Service: Gastroenterology;;   TONSILLECTOMY     VITRECTOMY Right 06/16/2014   WISDOM TOOTH EXTRACTION       Social History:   reports that he has never smoked. He has never used smokeless tobacco. He reports that he does not currently use alcohol. He reports that he does not use drugs.   Family History:  His family history includes Arrhythmia in his father; Coronary artery disease in an other family member; Hypertension in an other family member; Parkinson's disease in his mother; Sudden death in an other family member. There is no history of Colon cancer, Colon polyps, Esophageal cancer, Rectal cancer, or Stomach cancer.   Allergies Allergies  Allergen Reactions   Zestril [Lisinopril] Cough     Home Medications  Prior to Admission medications   Medication Sig Start Date End Date Taking? Authorizing Provider  chlorthalidone (HYGROTON) 25 MG tablet Take 12.5 mg by mouth every morning. 11/17/22  Yes [provider]  cloNIDine (CATAPRES) 0.2 MG tablet Take 1 tablet (0.2 mg total) by mouth 2 (two) times daily. 11/21/22  Yes [name], MD  hydrALAZINE (APRESOLINE) 50 MG tablet Take 1 tablet (50 mg total) by mouth 3 (three) times daily. 11/21/22  Yes [name], MD  HYDROcodone-acetaminophen (NORCO) 10-325 MG tablet Take 1 tablet by mouth every 6 (six) hours as needed for moderate pain. 12/06/22  Yes [name], MD  losartan (COZAAR) 100 MG tablet Take 1 tablet (100 mg total) by mouth daily. 11/20/22  Yes [name], MD  ondansetron (ZOFRAN-ODT) 4 MG disintegrating tablet Take 4 mg by mouth every 8 (eight) hours as needed for vomiting or nausea. 11/30/22  Yes [provider]   rosuvastatin (CRESTOR) 40 MG tablet TAKE 1 TABLET BY MOUTH EVERY DAY 11/02/22  Yes [name], MD  sildenafil (VIAGRA) 100 MG tablet Take 1 tablet (100 mg total) by mouth as needed for erectile dysfunction. 07/11/22  Yes [name], MD  Syringe/Needle, Disp, (SYRINGE 3CC/22GX1-1/2") 22G X 1-1/2" 3 ML MISC 1 Application by Does not apply route every 14 (fourteen) days. 07/13/22   [name], MD  testosterone cypionate (DEPOTESTOSTERONE CYPIONATE) 200 MG/ML injection Inject 1 mL (200 mg total) into the muscle every 14 (fourteen) days. Patient not taking: Reported on 12/20/2022 08/29/22   [name], MD     Critical care time: 39 minutes     Joneen Roach,  AGACNP-BC [facility]  See Amion for personal pager PCCM on call pager [phone] until 7pm. Please call Elink 7p-7a. [phone]  12/21/2022 11:07 PM

## 2022-12-21 NOTE — Anesthesia Postprocedure Evaluation (Signed)
Anesthesia Post Note  Patient: [name] [name]  Procedure(s) Performed: INSERTION OF SUPERIOR MESENTERIC ARTERY STENT (Groin) ULTRASOUND GUIDANCE FOR VASCULAR ACCESS FEMORAL ARTER (Left: Groin) BALLOON ANGIOPLASTY SUPERIOR MESENTERIC ARTERY (Left: Groin) PERCUTANEOUS THROMBECTOMY USING PENUMBRA ON SUPERIOR MESENTERIC ARTRY (Left: Groin) SUPERIOR MESENTERIC ARTERY ANGIOGRAM (Left: Groin) EXPLORATION LAPAROTOMY (Abdomen) APPLICATION OF ABTHERA WOUND VAC (Abdomen) SMALL BOWEL RESECTION (Abdomen)     Patient location during evaluation: SICU Anesthesia Type: General Level of consciousness: sedated Pain management: pain level controlled Vital Signs Assessment: post-procedure vital signs reviewed and stable Respiratory status: patient remains intubated per anesthesia plan Cardiovascular status: stable Postop Assessment: no apparent nausea or vomiting Anesthetic complications: no  No notable events documented.  Last Vitals:  Vitals:   12/21/22 1300 12/21/22 1610  BP: (!) 153/69 (!) 158/80  Pulse: 80 90  Resp: (!) 25 17  Temp: 37.1 C 37.1 C  SpO2: 98% 97%    Last Pain:  Vitals:   12/21/22 1610  TempSrc: Oral  PainSc:                  Shelton Silvas

## 2022-12-21 NOTE — Anesthesia Procedure Notes (Signed)
Procedure Name: Intubation Date/Time: 12/21/2022 5:52 PM  Performed by: Sandie Ano, CRNAPre-anesthesia Checklist: Patient identified, Emergency Drugs available, Suction available and Patient being monitored Patient Re-evaluated:Patient Re-evaluated prior to induction Oxygen Delivery Method: Circle System Utilized Preoxygenation: Pre-oxygenation with 100% oxygen Induction Type: IV induction Ventilation: Mask ventilation without difficulty Laryngoscope Size: Mac and 3 Grade View: Grade II Tube type: Oral Number of attempts: 1 Airway Equipment and Method: Stylet and Oral airway Placement Confirmation: ETT inserted through vocal cords under direct vision, positive ETCO2 and breath sounds checked- equal and bilateral Secured at: 23 cm Tube secured with: Tape Dental Injury: Teeth and Oropharynx as per pre-operative assessment

## 2022-12-21 NOTE — Progress Notes (Signed)
Pt seen and examined at bedside.  Initially stated the pain improved, noting waxing and waning episodes daily over the last 2 weeks.  The pain initially resolved but returned over the last 30 minutes. Described as an 8/10 and can't get comfortable.  On physical exam he is more tender than previous - a significant change in exam. On admission he had no tenderness. I elected to hold on CTA due to concern that contrast bolus could result in worsening kidney function and possible failure. Therefore he was treated conservatively with heparin gtt and hydration.   Both [name], his wife and myself had a long conversation regarding his current clinical status change and my concern for mesenteric ischemia.   I do not think we continue to treat conservatively, and that we need a CT angiogram to confirm the diagnosis.  This will also rule out other etiologies as he has had a prior history of GIST resection.   After discussing the risks and benefits of contrast, [name] elected to proceed with CT angiogram.    Victorino Sparrow MD

## 2022-12-21 NOTE — Anesthesia Procedure Notes (Signed)
Arterial Line Insertion Start/End9/03/2023 5:00 PM, 12/21/2022 5:05 PM Performed by: Darryl Nestle, CRNA, CRNA  Patient location: Pre-op. Preanesthetic checklist: patient identified, IV checked, site marked, risks and benefits discussed, surgical consent, monitors and equipment checked, pre-op evaluation, timeout performed and anesthesia consent Lidocaine 1% used for infiltration Right, radial was placed Catheter size: 20 G Hand hygiene performed  and maximum sterile barriers used   Attempts: 1 Procedure performed without using ultrasound guided technique. Ultrasound Notes:anatomy identified, needle tip was noted to be adjacent to the nerve/plexus identified and no ultrasound evidence of intravascular and/or intraneural injection Following insertion, dressing applied and Biopatch. Post procedure assessment: normal and unchanged  Patient tolerated the procedure well with no immediate complications.

## 2022-12-21 NOTE — Progress Notes (Signed)
PROGRESS NOTE    [name] [name]  UKG:[id number] DOB: 10-22-1955 DOA: 12/20/2022 PCP: [name], MD   Brief Narrative:  HPI: [name] [name] is a 67 y.o. male with medical history significant of hypertension, hyperlipidemia, GERD, known mesenteric artery stenosis having previously undergone SMA angiogram at the time of gastrointestinal stromal tumor removal in 2022. Over the last several weeks, he has been evaluated at both [facility] and [facility] with noncontrasted CT scan due to abdominal pain.  He has had couple of noncontrast CT of the abdomen, contrasted scans were not obtained due to chronic kidney disease.  Patient has had worsening abdominal pain for last 3 to 4 weeks and has lost about 30 pounds.  He went to see vascular surgery today but due to his overall clinical condition, he was sent to the emergency department for hospitalization and inpatient workup and management.  Patient describes his abdominal pain as crampy and sharp at times, intermittent, aggravated with food and relieved with rest and often times 10 out of 10.   ED Course: Upon arrival to ED, he was hemodynamically stable with normal vitals and labs other than elevated creatinine.  Patient was seen by vascular surgery in the ED.  Hospitalist were called for admission.  Assessment & Plan:   Principal Problem:   Superior mesenteric artery stenosis (HCC) Active Problems:   Hyperlipidemia   Essential hypertension   Hypoalbuminemia   GERD (gastroesophageal reflux disease)   Acute renal failure superimposed on stage 3a chronic kidney disease (HCC)  Abdominal pain with weight loss/superior mesenteric artery stenosis vs thrombosis suspected: Patient seen by vascular surgery.  They are suspecting either stenosis or thrombosis of the superior mesenteric artery.  They have started the patient on heparin.  Continue as needed IV morphine.  Their plan is to get CT abdomen with IV contrast once renal function improves.   AKI on CKD  stage IIIa/dehydration: Previously his creatinine appears to be around 1.6-1.8 at baseline but today creatinine is 3.0.  Likely due to dehydration due to inability to have good p.o. intake.  Hold all nephrotoxic agents, continue IV fluids, creatinine only improved to 2.9 but the lab was done last night.  I have ordered repeat BMP.   Essential hypertension: Patient appears to be on several medications including clonidine, chlorthalidone, losartan.  Due to AKI on CKD, we held chlorthalidone and losartan but resumed clonidine to prevent withdrawal.  Blood pressure is controlled.   GERD: Continue PPI.   Hyperlipidemia: Resume Crestor.   Hypoalbuminemia: Likely due to poor nutrition.  He says he has been losing 2 pounds every day and has lost 30 pounds in 3 weeks.  Dietitian consulted.  He may need alternative means of nutrition.   Elevated LFTs: Improving.  Monitor CMP.  DVT prophylaxis: Heparin   Code Status: Full Code  Family Communication:  None present at bedside.  Plan of care discussed with patient in length and he/she verbalized understanding and agreed with it.  Status is: Inpatient Remains inpatient appropriate because: Needs IV fluids and eventually CTA and vascular intervention.   Estimated body mass index is 30.32 kg/m as calculated from the following:   Height as of this encounter: 5\' 11"  (1.803 m).   Weight as of this encounter: 98.6 kg.    Nutritional Assessment: Body mass index is 30.32 kg/m.Marland Kitchen Seen by dietician.  I agree with the assessment and plan as outlined below: Nutrition Status:        . Skin Assessment: I  have examined the patient's skin and I agree with the wound assessment as performed by the wound care RN as outlined below:    Consultants:  Vascular surgery  Procedures:  As above  Antimicrobials:  Anti-infectives (From admission, onward)    None         Subjective: Patient seen and examined.  Abdominal pain is improving slowly.  No new  complaint.  Objective: Vitals:   12/21/22 0245 12/21/22 0458 12/21/22 0746 12/21/22 0800  BP: (!) 133/59  (!) 148/63 138/61  Pulse: 66  74 73  Resp: 17  16 16   Temp: 99.9 F (37.7 C)  98.6 F (37 C) 98.1 F (36.7 C)  TempSrc: Oral  Oral Oral  SpO2: 94%  97% 95%  Weight:  98.6 kg    Height:        Intake/Output Summary (Last 24 hours) at 12/21/2022 0838 Last data filed at 12/21/2022 0800 Gross per 24 hour  Intake 1796.03 ml  Output 700 ml  Net 1096.03 ml   Filed Weights   12/20/22 1320 12/21/22 0458  Weight: 95.7 kg 98.6 kg    Examination:  General exam: Appears calm and comfortable  Respiratory system: Clear to auscultation. Respiratory effort normal. Cardiovascular system: S1 & S2 heard, RRR. No JVD, murmurs, rubs, gallops or clicks. No pedal edema. Gastrointestinal system: Abdomen is nondistended, soft and moderate to severe tenderness and guarding.. No organomegaly or masses felt. Normal bowel sounds heard. Central nervous system: Alert and oriented. No focal neurological deficits. Extremities: Symmetric 5 x 5 power. Skin: No rashes, lesions or ulcers Psychiatry: Judgement and insight appear normal. Mood & affect appropriate.    Data Reviewed: I have personally reviewed following labs and imaging studies  CBC: Recent Labs  Lab 12/20/22 1441 12/20/22 2318  WBC 12.8* 13.6*  HGB 13.4 13.4  HCT 41.8 42.0  MCV 78.4* 81.2  PLT 282 207   Basic Metabolic Panel: Recent Labs  Lab 12/20/22 1441 12/20/22 2318  NA 138 138  K 3.8 3.2*  CL 99 100  CO2 29 27  GLUCOSE 126* 134*  BUN 36* 37*  CREATININE 3.00* 2.90*  CALCIUM 8.6* 8.6*  MG  --  1.6*   GFR: Estimated Creatinine Clearance: 29.2 mL/min (A) (by C-G formula based on SCr of 2.9 mg/dL (H)). Liver Function Tests: Recent Labs  Lab 12/20/22 1441 12/20/22 2318  AST 74* 65*  ALT 105* 95*  ALKPHOS 67 61  BILITOT 1.2 1.0  PROT 6.8 5.9*  ALBUMIN 3.1* 2.8*   No results for input(s): "LIPASE",  "AMYLASE" in the last 168 hours. No results for input(s): "AMMONIA" in the last 168 hours. Coagulation Profile: No results for input(s): "INR", "PROTIME" in the last 168 hours. Cardiac Enzymes: No results for input(s): "CKTOTAL", "CKMB", "CKMBINDEX", "TROPONINI" in the last 168 hours. BNP (last 3 results) Recent Labs    07/11/22 1451  PROBNP 7.0   HbA1C: No results for input(s): "HGBA1C" in the last 72 hours. CBG: No results for input(s): "GLUCAP" in the last 168 hours. Lipid Profile: No results for input(s): "CHOL", "HDL", "LDLCALC", "TRIG", "CHOLHDL", "LDLDIRECT" in the last 72 hours. Thyroid Function Tests: Recent Labs    12/20/22 2040  TSH 0.789   Anemia Panel: No results for input(s): "VITAMINB12", "FOLATE", "FERRITIN", "TIBC", "IRON", "RETICCTPCT" in the last 72 hours. Sepsis Labs: No results for input(s): "PROCALCITON", "LATICACIDVEN" in the last 168 hours.  No results found for this or any previous visit (from the past 240 hour(s)).  Radiology Studies: DG Chest Port 1 View  Result Date: 12/20/2022 CLINICAL DATA:  Upper abdominal pain EXAM: PORTABLE CHEST 1 VIEW COMPARISON:  09/11/2022 FINDINGS: Cardiac and mediastinal contours are within normal limits. No focal pulmonary opacity. No pleural effusion or pneumothorax. No acute osseous abnormality. IMPRESSION: No acute cardiopulmonary process. Electronically Signed   By: Wiliam Ke M.D.   On: 12/20/2022 15:48    Scheduled Meds:  cloNIDine  0.2 mg Oral BID   feeding supplement  237 mL Oral BID BM   pantoprazole  40 mg Oral Daily   rosuvastatin  40 mg Oral Daily   sodium chloride flush  3 mL Intravenous Q12H   Continuous Infusions:  sodium chloride 125 mL/hr at 12/21/22 0143   heparin 1,750 Units/hr (12/21/22 0147)     LOS: 1 day   Hughie Closs, MD [facility]  12/21/2022, 8:38 AM   *Please note that this is a verbal dictation therefore any spelling or grammatical errors are due to the "[employer]agon  [employer] One" system interpretation.  Please page via Amion and do not message via secure chat for urgent patient care matters. Secure chat can be used for non urgent patient care matters.  How to contact the [facility] Attending or Consulting provider 7A - 7P or covering provider during after hours 7P -7A, for this patient?  Check the care team in [facility] and look for a) attending/consulting [facility] provider listed and b) the [facility] team listed. Page or secure chat 7A-7P. Log into [email or web address] and use [facility]'s universal password to access. If you do not have the password, please contact the hospital operator. Locate the [facility] provider you are looking for under [facility] and page to a number that you can be directly reached. If you still have difficulty reaching the provider, please page the [facility] (Director on Call) for the Hospitalists listed on amion for assistance.

## 2022-12-21 NOTE — Progress Notes (Signed)
Patient CT scan reviewed  Soft atheromatous plaque versus thrombus 2 to 3 cm from the ostia of the SMA with severe stenosis.  Distally, there appears to be branch occlusion.  This is difficult to visualize due to the significant amount of calcific disease as well as light contrast.  [name] [name] has had symptoms now for 2 weeks.  His abdomen is tender, and he continues to complain of significant abdominal pain with narcotic pain medication on board.  I had a long conversation with both him and his wife regarding continued medical management versus intervention.  He has been on heparin now for 24 hours with no improvement in symptoms, infect symptoms have worsened.   I do not think he will have significant improvement.  Furthermore, there is some bowel edema that I think needs to be addressed.  I have reached out to my general surgery colleagues who plan on visualizing the bowel during the operation using laparoscopy, possible open should he need bowel resection.  My plan is to perform mesenteric angiography with plans to intervene on the proximal segment using a covered stent.  Distally, I plan to address the areas of stenosis with possible suction thrombectomy versus tPA administration.  I am worried that with his current symptoms placing a lytic catheter would only make his abdominal pain worse as it would also limit flow to the branches which are patent.   Victorino Sparrow MD

## 2022-12-21 NOTE — Transfer of Care (Signed)
Immediate Anesthesia Transfer of Care Note  Patient: [name] [name]  Procedure(s) Performed: INSERTION OF SUPERIOR MESENTERIC ARTERY STENT (Groin) ULTRASOUND GUIDANCE FOR VASCULAR ACCESS FEMORAL ARTER (Left: Groin) BALLOON ANGIOPLASTY SUPERIOR MESENTERIC ARTERY (Left: Groin) PERCUTANEOUS THROMBECTOMY USING PENUMBRA ON SUPERIOR MESENTERIC ARTRY (Left: Groin) SUPERIOR MESENTERIC ARTERY ANGIOGRAM (Left: Groin) EXPLORATION LAPAROTOMY (Abdomen) APPLICATION OF ABTHERA WOUND VAC (Abdomen) SMALL BOWEL RESECTION (Abdomen)  Patient Location: ICU  Anesthesia Type:General  Level of Consciousness: Patient remains intubated per anesthesia plan  Airway & Oxygen Therapy: Patient remains intubated per anesthesia plan and Patient placed on Ventilator (see vital sign flow sheet for setting)  Post-op Assessment: Report given to RN and Post -op Vital signs reviewed and stable  Post vital signs: Reviewed and stable  Last Vitals:  Vitals Value Taken Time  BP 160/71 12/21/22 2222  Temp    Pulse 92 12/21/22 2243  Resp 15 12/21/22 2243  SpO2 99 % 12/21/22 2243  Vitals shown include unfiled device data.  Last Pain:  Vitals:   12/21/22 1610  TempSrc: Oral  PainSc:       Patients Stated Pain Goal: 0 (12/21/22 4098)  Complications: No notable events documented.

## 2022-12-21 NOTE — Progress Notes (Signed)
ANTICOAGULATION CONSULT NOTE - Follow Up  Pharmacy Consult for heparin Indication: SMA stenosis/occlusion  Allergies  Allergen Reactions   Zestril [Lisinopril] Cough    Patient Measurements: Height: 5\' 11"  (180.3 cm) Weight: 98.6 kg (217 lb 6 oz) IBW/kg (Calculated) : 75.3 Heparin Dosing Weight: 94.6kg  Vital Signs: Temp: 98.1 F (36.7 C) (09/12 0800) Temp Source: Oral (09/12 0800) BP: 138/61 (09/12 0800) Pulse Rate: 73 (09/12 0800)  Labs: Recent Labs    12/20/22 1441 12/20/22 2318 12/21/22 0803  HGB 13.4 13.4 11.7*  HCT 41.8 42.0 37.0*  PLT 282 207 227  HEPARINUNFRC  --  <0.10* 0.14*  CREATININE 3.00* 2.90*  --     Estimated Creatinine Clearance: 29.2 mL/min (A) (by C-G formula based on SCr of 2.9 mg/dL (H)).   Assessment: 70 YOM presenting with abdominal pain, SMA stenosis vs. Occlusion, he is not on anticoagulation PTA.  HL 0.14- subtherapeutic Hgb 11.7, Plt 227 No s/sx of bleeding or issues with infusion per RN   Goal of Therapy:  Heparin level 0.3-0.7 units/ml Monitor platelets by anticoagulation protocol: Yes   Plan:  Heparin 1500 units IV x 1 bolus Increase heparin gtt to 2000 units/hr F/u 6 hour heparin level at 1500 F/u VVS plans   Calton Dach, PharmD, BCCCP Clinical Pharmacist 12/21/2022 9:13 AM

## 2022-12-21 NOTE — Anesthesia Preprocedure Evaluation (Addendum)
Anesthesia Evaluation  Patient identified by MRN, date of birth, ID band Patient awake    Reviewed: Allergy & Precautions, NPO status , Patient's Chart, lab work & pertinent test results  Airway Mallampati: II  TM Distance: >3 FB Neck ROM: Full    Dental no notable dental hx.    Pulmonary neg pulmonary ROS   Pulmonary exam normal        Cardiovascular hypertension, Pt. on medications + Peripheral Vascular Disease  Normal cardiovascular exam  ECHO: 1. Left ventricular ejection fraction, by estimation, is 60 to 65%. The  left ventricle has normal function. The left ventricle has no regional  wall motion abnormalities. There is mild left ventricular hypertrophy.  Left ventricular diastolic parameters  are consistent with Grade I diastolic dysfunction (impaired relaxation).   2. Right ventricular systolic function is normal. The right ventricular  size is normal. Tricuspid regurgitation signal is inadequate for assessing  PA pressure.   3. The mitral valve is normal in structure. No evidence of mitral valve  regurgitation.   4. The aortic valve is grossly normal. Aortic valve regurgitation is not  visualized.   5. The inferior vena cava is normal in size with greater than 50%  respiratory variability, suggesting right atrial pressure of 3 mmHg.     Neuro/Psych negative neurological ROS  negative psych ROS   GI/Hepatic negative GI ROS, Neg liver ROS,,,  Endo/Other  negative endocrine ROS    Renal/GU CRFRenal disease     Musculoskeletal negative musculoskeletal ROS (+)    Abdominal  (+) + obese  Peds  Hematology  (+) Blood dyscrasia, anemia   Anesthesia Other Findings Acute mesentaric ischemia  Reproductive/Obstetrics                             Anesthesia Physical Anesthesia Plan  ASA: 4 and emergent  Anesthesia Plan: General   Post-op Pain Management:    Induction:  Intravenous  PONV Risk Score and Plan: 2 and Ondansetron, Dexamethasone, Midazolam and Treatment may vary due to age or medical condition  Airway Management Planned: Oral ETT  Additional Equipment: Arterial line  Intra-op Plan:   Post-operative Plan: Possible Post-op intubation/ventilation  Informed Consent: I have reviewed the patients History and Physical, chart, labs and discussed the procedure including the risks, benefits and alternatives for the proposed anesthesia with the patient or authorized representative who has indicated his/her understanding and acceptance.     Dental advisory given  Plan Discussed with: CRNA  Anesthesia Plan Comments: (Potential central line placement discussed)       Anesthesia Quick Evaluation

## 2022-12-21 NOTE — Progress Notes (Signed)
eLink Physician-Brief Progress Note Patient Name: [name] [name] DOB: 1956-02-16 MRN: [id number]   Date of Service  12/21/2022  HPI/Events of Note  Patient admitted to the ICU with post-operative respiratory failure after exploratory laparotomy for bowel resection and stent placement related to mesenteric ischemia.  eICU Interventions  New Patient Evaluation.        [name] [name] 12/21/2022, 11:06 PM

## 2022-12-21 NOTE — Progress Notes (Signed)
ANTICOAGULATION CONSULT NOTE - Follow Up  Pharmacy Consult for heparin Indication: SMA stenosis/occlusion  Allergies  Allergen Reactions   Zestril [Lisinopril] Cough    Patient Measurements: Height: 5\' 11"  (180.3 cm) Weight: 98.6 kg (217 lb 6 oz) IBW/kg (Calculated) : 75.3 Heparin Dosing Weight: 94.6kg  Vital Signs: Temp: 98.8 F (37.1 C) (09/12 1610) Temp Source: Oral (09/12 1610) BP: 158/80 (09/12 1610) Pulse Rate: 90 (09/12 1610)  Labs: Recent Labs    12/20/22 1441 12/20/22 2318 12/21/22 0803 12/21/22 1452  HGB 13.4 13.4 11.7*  --   HCT 41.8 42.0 37.0*  --   PLT 282 207 227  --   HEPARINUNFRC  --  <0.10* 0.14* 0.13*  CREATININE 3.00* 2.90*  --  2.89*    Estimated Creatinine Clearance: 29.3 mL/min (A) (by C-G formula based on SCr of 2.89 mg/dL (H)).   Assessment: 74 YOM presenting with abdominal pain, SMA stenosis vs. Occlusion, he is not on anticoagulation PTA.  HL 0.13- subtherapeutic, per RN a little trouble with that IV line, Hgb 11.7, Plt 227 No s/sx of bleeding or issues with infusion per RN   9/13 PM: Patient is status post insertion of superior mesenteric artery stent and underwent exploratory laparotomy, now with open abdomen and in discontinuity. Per vascular okay to restart heparin at previous rate and titrate based on protocol.  Goal of Therapy:  Heparin level 0.3-0.7 units/ml Monitor platelets by anticoagulation protocol: Yes   Plan:  Start IV heparin at 2200 units/hr Recheck heparin level in 8 hrs. Monitor daily heparin level and CBC  Arabella Merles, PharmD. Clinical Pharmacist 12/21/2022 11:08 PM

## 2022-12-21 NOTE — Consult Note (Addendum)
[name] [name] May 29, 1955  [id number].    Requesting MD: Elna Breslow, MD Chief Complaint/Reason for Consult: mesenteric ischemia  HPI:  [name] is a 67 yo male who presented with several weeks of worsening abdominal pain. He has known mesenteric stenosis. Over the last few weeks, he has lost 30 pounds due to difficulty eating and drinking secondary to pain. He denies blood in his stools and changes in his bowel habits. He was admitted yesterday and had an AKI, and CTA was deferred to allow improvement in creatinine, and pain was relatively well-controlled at the time. He was treated with a heparin gtt. Today his pain has worsened, so a CT was performed and showed an SMA thrombus, as well as plaque in the SMA. Vascular surgery is planning to proceed with a mesenteric angiogram and SMA stent placement. General surgery was consulted for evaluation of the small bowel.  The patient's prior abdominal surgeries include a laparoscopic cholecystectomy and a resection of a small bowel GIST in 2022.  ROS: Review of Systems  Constitutional:  Negative for chills and fever.  Respiratory:  Negative for shortness of breath.   Gastrointestinal:  Positive for abdominal pain. Negative for blood in stool, nausea and vomiting.    Family History  Problem Relation Age of Onset   Parkinson's disease Mother    Arrhythmia Father    Coronary artery disease Other        fhx   Hypertension Other        fhx   Sudden death Other        fhx   Colon cancer Neg Hx    Colon polyps Neg Hx    Esophageal cancer Neg Hx    Rectal cancer Neg Hx    Stomach cancer Neg Hx     Past Medical History:  Diagnosis Date   Ankle fracture    Cancer (HCC) 1974   testicular cancer-at age 53   Cataract    bilateral sx   GERD (gastroesophageal reflux disease)    hx of   Hx of colonic polyps    pt unsure,thinks this was done in 1982 in [place]. no reports in EPIC   Hyperlipidemia    on meds   Hypertension    on meds   Kidney  stones    Patella fracture    Retinal detachment    OD   Retinal tear of right eye    Syncopal episodes 2008    Past Surgical History:  Procedure Laterality Date   AIR/FLUID EXCHANGE Right 06/16/2014   Procedure: AIR/FLUID EXCHANGE;  Surgeon: Sherrie George, MD;  Location: [facility] OR;  Service: Ophthalmology;  Laterality: Right;   BIOPSY  04/04/2021   Procedure: BIOPSY;  Surgeon: Meridee Score Netty Starring., MD;  Location: [facility] ENDOSCOPY;  Service: Gastroenterology;;   CATARACT EXTRACTION     CATARACT EXTRACTION  09/2014   rt eye//left 01/2015   CATARACT EXTRACTION, BILATERAL  2016   per Dr. Elmer Picker    COLONOSCOPY  02/16/2020   per Dr. Rhea Belton, adenomatous polyps, repeat in 3 yrs   ENTEROSCOPY N/A 04/04/2021   Procedure: ENTEROSCOPY;  Surgeon: Mansouraty, Netty Starring., MD;  Location: [facility] ENDOSCOPY;  Service: Gastroenterology;  Laterality: N/A;   EYE SURGERY     GAS INSERTION Right 06/16/2014   Procedure: INSERTION OF GAS;  Surgeon: Sherrie George, MD;  Location: [facility] OR;  Service: Ophthalmology;  Laterality: Right;  [id number]   GIVENS CAPSULE STUDY N/A 04/02/2021   Procedure: GIVENS CAPSULE STUDY;  Surgeon: Lemar Lofty., MD;  Location: [facility] ENDOSCOPY;  Service: Gastroenterology;  Laterality: N/A;   HYDROCELE EXCISION     IR ANGIOGRAM VISCERAL SELECTIVE  04/04/2021   IR US GUIDE VASC ACCESS LEFT  04/04/2021   KNEE ARTHROSCOPY Right    x 2   LASER PHOTO ABLATION Right 06/16/2014   Procedure: LASER PHOTO ABLATION;  Surgeon: Sherrie George, MD;  Location: [facility] OR;  Service: Ophthalmology;  Laterality: Right;  Headscope laser and endolaser    ORCHIECTOMY     right testicle   REPAIR OF COMPLEX TRACTION RETINAL DETACHMENT Right 06/16/2014   Procedure: REPAIR OF COMPLEX TRACTION RETINAL DETACHMENT;  Surgeon: Sherrie George, MD;  Location: [facility] OR;  Service: Ophthalmology;  Laterality: Right;   RETINAL DETACHMENT SURGERY  06/2014   SUBMUCOSAL TATTOO INJECTION  04/04/2021   Procedure: SUBMUCOSAL  TATTOO INJECTION;  Surgeon: Lemar Lofty., MD;  Location: [facility] ENDOSCOPY;  Service: Gastroenterology;;   TONSILLECTOMY     VITRECTOMY Right 06/16/2014   WISDOM TOOTH EXTRACTION      Social History:  reports that he has never smoked. He has never used smokeless tobacco. He reports that he does not currently use alcohol. He reports that he does not use drugs.  Allergies:  Allergies  Allergen Reactions   Zestril [Lisinopril] Cough    Medications Prior to Admission  Medication Sig Dispense Refill   chlorthalidone (HYGROTON) 25 MG tablet Take 12.5 mg by mouth every morning.     cloNIDine (CATAPRES) 0.2 MG tablet Take 1 tablet (0.2 mg total) by mouth 2 (two) times daily. 60 tablet 2   hydrALAZINE (APRESOLINE) 50 MG tablet Take 1 tablet (50 mg total) by mouth 3 (three) times daily. 90 tablet 2   HYDROcodone-acetaminophen (NORCO) 10-325 MG tablet Take 1 tablet by mouth every 6 (six) hours as needed for moderate pain. 20 tablet 0   losartan (COZAAR) 100 MG tablet Take 1 tablet (100 mg total) by mouth daily. 90 tablet 0   ondansetron (ZOFRAN-ODT) 4 MG disintegrating tablet Take 4 mg by mouth every 8 (eight) hours as needed for vomiting or nausea.     rosuvastatin (CRESTOR) 40 MG tablet TAKE 1 TABLET BY MOUTH EVERY DAY 90 tablet 0   sildenafil (VIAGRA) 100 MG tablet Take 1 tablet (100 mg total) by mouth as needed for erectile dysfunction. 10 tablet 5   Syringe/Needle, Disp, (SYRINGE 3CC/22GX1-1/2") 22G X 1-1/2" 3 ML MISC 1 Application by Does not apply route every 14 (fourteen) days. 50 each 0   testosterone cypionate (DEPOTESTOSTERONE CYPIONATE) 200 MG/ML injection Inject 1 mL (200 mg total) into the muscle every 14 (fourteen) days. (Patient not taking: Reported on 12/20/2022) 10 mL 1     Physical Exam: Blood pressure (!) 158/80, pulse 90, temperature 98.8 F (37.1 C), temperature source Oral, resp. rate 17, height 5\' 11"  (1.803 m), weight 98.6 kg, SpO2 97%. General: resting in bed,  appears uncomfortable Neurological: alert and oriented, no focal deficits, cranial nerves grossly in tact HEENT: normocephalic, atraumatic Respiratory: normal work of breathing on room air Abdomen: soft, nondistended, diffusely tender to palpation. Well-healed laparoscopic surgical scars. Extremities: warm and well-perfused, no deformities, moving all extremities spontaneously Skin: warm and dry, no jaundice, no rashes or lesions   Results for orders placed or performed during the hospital encounter of 12/20/22 (from the past 48 hour(s))  CBC     Status: Abnormal   Collection Time: 12/20/22  2:41 PM  Result Value Ref Range   WBC  12.8 (H) 4.0 - 10.5 K/uL   RBC 5.33 4.22 - 5.81 MIL/uL   Hemoglobin 13.4 13.0 - 17.0 g/dL   HCT 09.8 11.9 - 14.7 %   MCV 78.4 (L) 80.0 - 100.0 fL   MCH 25.1 (L) 26.0 - 34.0 pg   MCHC 32.1 30.0 - 36.0 g/dL   RDW 82.9 56.2 - 13.0 %   Platelets 282 150 - 400 K/uL   nRBC 0.0 0.0 - 0.2 %    Comment: Performed at [facility], [address], [place], [place] [address]  Comprehensive metabolic panel     Status: Abnormal   Collection Time: 12/20/22  2:41 PM  Result Value Ref Range   Sodium 138 135 - 145 mmol/L   Potassium 3.8 3.5 - 5.1 mmol/L   Chloride 99 98 - 111 mmol/L   CO2 29 22 - 32 mmol/L   Glucose, Bld 126 (H) 70 - 99 mg/dL    Comment: Glucose reference range applies only to samples taken after fasting for at least 8 hours.   BUN 36 (H) 8 - 23 mg/dL   Creatinine, Ser 4.69 (H) 0.61 - 1.24 mg/dL   Calcium 8.6 (L) 8.9 - 10.3 mg/dL   Total Protein 6.8 6.5 - 8.1 g/dL   Albumin 3.1 (L) 3.5 - 5.0 g/dL   AST 74 (H) 15 - 41 U/L   ALT 105 (H) 0 - 44 U/L   Alkaline Phosphatase 67 38 - 126 U/L   Total Bilirubin 1.2 0.3 - 1.2 mg/dL   GFR, Estimated 22 (L) >60 mL/min    Comment: (NOTE) Calculated using the CKD-EPI Creatinine Equation (2021)    Anion gap 10 5 - 15    Comment: Performed at [facility], [address] [place], [place] [address]   HIV Antibody (routine testing w rflx)     Status: None   Collection Time: 12/20/22  8:40 PM  Result Value Ref Range   HIV Screen 4th Generation wRfx Non Reactive Non Reactive    Comment: Performed at [facility], [address], [place], [place] [address]  TSH     Status: None   Collection Time: 12/20/22  8:40 PM  Result Value Ref Range   TSH 0.789 0.350 - 4.500 uIU/mL    Comment: Performed by a 3rd Generation assay with a functional sensitivity of <=0.01 uIU/mL. Performed at [facility], [address] [place], [place] [address]   Heparin level (unfractionated)     Status: Abnormal   Collection Time: 12/20/22 11:18 PM  Result Value Ref Range   Heparin Unfractionated <0.10 (L) 0.30 - 0.70 IU/mL    Comment: (NOTE) The clinical reportable range upper limit is being lowered to >1.10 to align with the FDA approved guidance for the current laboratory assay.  If heparin results are below expected values, and patient dosage has  been confirmed, suggest follow up testing of antithrombin III levels. Performed at [facility], [address] [place], [place] [address]   CBC     Status: Abnormal   Collection Time: 12/20/22 11:18 PM  Result Value Ref Range   WBC 13.6 (H) 4.0 - 10.5 K/uL   RBC 5.17 4.22 - 5.81 MIL/uL   Hemoglobin 13.4 13.0 - 17.0 g/dL   HCT 66.4 40.3 - 47.4 %   MCV 81.2 80.0 - 100.0 fL   MCH 25.9 (L) 26.0 - 34.0 pg   MCHC 31.9 30.0 - 36.0 g/dL  RDW 13.6 11.5 - 15.5 %   Platelets 207 150 - 400 K/uL   nRBC 0.0 0.0 - 0.2 %    Comment: Performed at [facility], [address] [place], [place] [address]  Magnesium     Status: Abnormal   Collection Time: 12/20/22 11:18 PM  Result Value Ref Range   Magnesium 1.6 (L) 1.7 - 2.4 mg/dL    Comment: Performed at [facility], [address] [place], [place] [address]  Comprehensive metabolic panel     Status: Abnormal   Collection Time: 12/20/22 11:18 PM  Result Value Ref Range   Sodium  138 135 - 145 mmol/L   Potassium 3.2 (L) 3.5 - 5.1 mmol/L   Chloride 100 98 - 111 mmol/L   CO2 27 22 - 32 mmol/L   Glucose, Bld 134 (H) 70 - 99 mg/dL    Comment: Glucose reference range applies only to samples taken after fasting for at least 8 hours.   BUN 37 (H) 8 - 23 mg/dL   Creatinine, Ser 6.38 (H) 0.61 - 1.24 mg/dL   Calcium 8.6 (L) 8.9 - 10.3 mg/dL   Total Protein 5.9 (L) 6.5 - 8.1 g/dL   Albumin 2.8 (L) 3.5 - 5.0 g/dL   AST 65 (H) 15 - 41 U/L   ALT 95 (H) 0 - 44 U/L   Alkaline Phosphatase 61 38 - 126 U/L   Total Bilirubin 1.0 0.3 - 1.2 mg/dL   GFR, Estimated 23 (L) >60 mL/min    Comment: (NOTE) Calculated using the CKD-EPI Creatinine Equation (2021)    Anion gap 11 5 - 15    Comment: Performed at [facility], [address] [place], [place] [address]  Heparin level (unfractionated)     Status: Abnormal   Collection Time: 12/21/22  8:03 AM  Result Value Ref Range   Heparin Unfractionated 0.14 (L) 0.30 - 0.70 IU/mL    Comment: (NOTE) The clinical reportable range upper limit is being lowered to >1.10 to align with the FDA approved guidance for the current laboratory assay.  If heparin results are below expected values, and patient dosage has  been confirmed, suggest follow up testing of antithrombin III levels. Performed at [facility], [address] [place], [place] [address]   CBC     Status: Abnormal   Collection Time: 12/21/22  8:03 AM  Result Value Ref Range   WBC 14.1 (H) 4.0 - 10.5 K/uL   RBC 4.73 4.22 - 5.81 MIL/uL   Hemoglobin 11.7 (L) 13.0 - 17.0 g/dL   HCT 88.4 (L) 16.6 - 06.3 %   MCV 78.2 (L) 80.0 - 100.0 fL   MCH 24.7 (L) 26.0 - 34.0 pg   MCHC 31.6 30.0 - 36.0 g/dL   RDW 01.6 01.0 - 93.2 %   Platelets 227 150 - 400 K/uL   nRBC 0.0 0.0 - 0.2 %    Comment: Performed at [facility], [address] [place], [place] [address]  Heparin level (unfractionated)     Status: Abnormal   Collection Time: 12/21/22  2:52 PM  Result Value  Ref Range   Heparin Unfractionated 0.13 (L) 0.30 - 0.70 IU/mL    Comment: (NOTE) The clinical reportable range upper limit is being lowered to >1.10 to align with the FDA approved guidance for the current laboratory assay.  If heparin results are below expected values, and patient dosage has  been confirmed, suggest follow up  testing of antithrombin III levels. Performed at [facility], [address], [place], [place] [address]   Basic metabolic panel     Status: Abnormal   Collection Time: 12/21/22  2:52 PM  Result Value Ref Range   Sodium 137 135 - 145 mmol/L   Potassium 3.3 (L) 3.5 - 5.1 mmol/L   Chloride 101 98 - 111 mmol/L   CO2 23 22 - 32 mmol/L   Glucose, Bld 104 (H) 70 - 99 mg/dL    Comment: Glucose reference range applies only to samples taken after fasting for at least 8 hours.   BUN 37 (H) 8 - 23 mg/dL   Creatinine, Ser 6.21 (H) 0.61 - 1.24 mg/dL   Calcium 8.1 (L) 8.9 - 10.3 mg/dL   GFR, Estimated 23 (L) >60 mL/min    Comment: (NOTE) Calculated using the CKD-EPI Creatinine Equation (2021)    Anion gap 13 5 - 15    Comment: Performed at [facility], [address], [place], [place] [address]  Lactate dehydrogenase     Status: None   Collection Time: 12/21/22  2:52 PM  Result Value Ref Range   LDH 160 98 - 192 U/L    Comment: Performed at [facility], [address] [place], [place] [address]   DG Chest Port 1 View  Result Date: 12/20/2022 CLINICAL DATA:  Upper abdominal pain EXAM: PORTABLE CHEST 1 VIEW COMPARISON:  09/11/2022 FINDINGS: Cardiac and mediastinal contours are within normal limits. No focal pulmonary opacity. No pleural effusion or pneumothorax. No acute osseous abnormality. IMPRESSION: No acute cardiopulmonary process. Electronically Signed   By: Wiliam Ke M.D.   On: 12/20/2022 15:48      Assessment/Plan 67 yo male with SMA stenosis and thrombosis, with worsening abdominal pain. I personally reviewed the patient's labs,  imaging and notes. There is no pneumatosis or portal venous gas on the CT scan, however given the known SMA disease and the degree of the patient's pain, will proceed with diagnostic laparoscopy concurrently with the planned vascular procedure to evaluate for small bowel ischemia. I reviewed the details of the planned procedure with the patient, including the possible of laparotomy and small bowel resection, and the possibility of a colostomy. He expressed understanding and agrees to proceed. Will likely be done by Dr. [name], MD [facility] Surgery General, Hepatobiliary and Pancreatic Surgery 12/21/22 4:41 PM

## 2022-12-21 NOTE — Op Note (Signed)
  12/21/2022  10:01 PM  PATIENT:  [name] [name]  67 y.o. male  PRE-OPERATIVE DIAGNOSIS:  Acute mesentaric ischemia  POST-OPERATIVE DIAGNOSIS:  Acute mesentaric ischemia, necrotic area of jejunum  PROCEDURE:  Procedure(s): EXPLORATORY LAPAROTOMY 2.   SMALL BOWEL RESECTION 3.   PLACEMENT OF ABTHERA OPEN ABDOMEN NEGATIVE PRESSURE DRESSING  SURGEON:  Violeta Gelinas, MD  ASSISTANTS: Heath Lark, MD  ANESTHESIA:   general  EBL:  Total I/O In: 1250 [I.V.:1000; IV Piggyback:250] Out: 350 [Urine:350]  BLOOD ADMINISTERED:none  DRAINS: none   SPECIMEN:  Excision  DISPOSITION OF SPECIMEN:  PATHOLOGY  COUNTS:  YES  DICTATION: .Dragon Dictation Procedure in detail: Informed consent was obtained.  He received intravenous antibiotics.  He was in the operating room with Dr. Karin Lieu where he underwent endovascular treatment of his mesenteric ischemia including angioplasty and stenting of his SMA.  Once this was completed I proceeded with exploratory laparotomy.  We suspected significant ischemic bowel.  Timeout procedure was performed.  A midline incision was made.  Subcutaneous tissues were dissected down revealing the anterior fascia.  This was divided sharply along the midline and the peritoneal cavity was entered under direct vision.  The fascia was opened to the length of the incision.  We began exploring the small bowel and found some areas of ischemia so we enlarged the laparotomy incision.  The small bowel was then run from the ligament of Treitz distally.  The jejunum had an area of about 3 cm of clear necrosis without perforation.  We continued to further run the bowel.  There was a small bowel anastomosis with some adhesions around it from his previous surgery.  This was freed up and we continued running the bowel.  There is a long section of ileum that was dusky but not frankly ischemic.  There were areas of pink in between the dusky areas and as we observed that it seemed to be  improving.  We continued running the bowel all the way to the terminal ileum.  No other areas of frank necrosis.  No areas of perforation.  Attention was directed back to the necrotic area of jejunum.  This was divided proximally and distally with GIA 75 stapler.  The mesentery was taken down with Kelly clamps and the specimen was removed.  It was marked for pathology and sent off.  Decision was made to leave him in discontinuity.  The mesentery was controlled with suture ligatures of silk.  There was good hemostasis.  Of note, the mesenteric edges had good bleeding.  There was excellent hemostasis at this point and we looked again at the ischemic changes in the ileum and that seemed to be improving.  Bowel was returned to anatomic position.  Decision was made to leave him in discontinuity and we placed an ABThera open abdomen dressing in standard fashion.  Excellent seal was obtained.  All counts were correct.  There were no apparent complications from my procedure.  He was taken directly to the intensive care unit on the ventilator. PATIENT DISPOSITION:  PACU - hemodynamically stable.   Delay start of Pharmacological VTE agent (>24hrs) due to surgical blood loss or risk of bleeding:  no  Violeta Gelinas, MD, MPH, FACS Pager: [phone]  9/12/202410:01 PM

## 2022-12-21 NOTE — Progress Notes (Addendum)
  Progress Note    12/21/2022 6:53 AM Hospital Day 1  Subjective:  says he is okay at this moment.  Has taken in some po fluids.    Tm 99.9  Vitals:   12/21/22 0017 12/21/22 0245  BP: 122/63 (!) 133/59  Pulse: 72 66  Resp: 17 17  Temp: 99.1 F (37.3 C) 99.9 F (37.7 C)  SpO2: 94% 94%    Physical Exam: General:  no distress; not ill appearing Lungs:  non labored Extremities:  right pedal pulses are not palpable; left PT pulse is palpable.  Bilateral feet are warm and well perfused. Abdomen:  soft but tender midline and RLQ  CBC    Component Value Date/Time   WBC 13.6 (H) 12/20/2022 2318   RBC 5.17 12/20/2022 2318   HGB 13.4 12/20/2022 2318   HCT 42.0 12/20/2022 2318   PLT 207 12/20/2022 2318   MCV 81.2 12/20/2022 2318   MCH 25.9 (L) 12/20/2022 2318   MCHC 31.9 12/20/2022 2318   RDW 13.6 12/20/2022 2318   LYMPHSABS 0.9 10/30/2022 1654   MONOABS 0.6 10/30/2022 1654   EOSABS 0.3 10/30/2022 1654   BASOSABS 0.0 10/30/2022 1654    BMET    Component Value Date/Time   NA 138 12/20/2022 2318   K 3.2 (L) 12/20/2022 2318   CL 100 12/20/2022 2318   CO2 27 12/20/2022 2318   GLUCOSE 134 (H) 12/20/2022 2318   BUN 37 (H) 12/20/2022 2318   CREATININE 2.90 (H) 12/20/2022 2318   CALCIUM 8.6 (L) 12/20/2022 2318   GFRNONAA 23 (L) 12/20/2022 2318   GFRAA >60 04/14/2015 1127    INR No results found for: "INR"   Intake/Output Summary (Last 24 hours) at 12/21/2022 0653 Last data filed at 12/21/2022 0400 Gross per 24 hour  Intake 1796.03 ml  Output 450 ml  Net 1346.03 ml     Assessment/Plan:  67 y.o. male admitted with abdominal pain with hx of SMA stenoses Hospital Day 1  -pt continues to have abdominal tenderness but currently manageable.  Has taken in some po fluids.  He does have slight increase in leukocytosis. With temp of 99.9 overnight.   -AKI with creatinine of 2.9 last evening down from 3.0 yesterday with IVF and holding nephrotoxic agents.  Will need CTA  once renal function has improved.   -hgb stable at 13.4 last evening -he has been started on IV heparin for possible embolic event   Doreatha Massed, PA-C [facility] [phone] 12/21/2022 6:53 AM   VASCULAR STAFF ADDENDUM: I have independently interviewed and examined the patient. I agree with the above.  Was initially doing well this morning, the episode of abdominal pain.  He described the episode as similar to what he experiences at home when he tries to eat.  I would like to hold off on a CT scan due to his elevated creatinine.  If his pain becomes worse than what it usually is at home, I asked him to alert his nurse immediately for urgent CT scan.  Please continue this time.  Please continue IV fluids.  Will see again this afternoon   Victorino Sparrow MD [facility] Phone Number: [phone] 12/21/2022 12:57 PM

## 2022-12-22 ENCOUNTER — Encounter (HOSPITAL_COMMUNITY): Payer: Self-pay | Admitting: Vascular Surgery

## 2022-12-22 DIAGNOSIS — K551 Chronic vascular disorders of intestine: Secondary | ICD-10-CM | POA: Diagnosis not present

## 2022-12-22 LAB — POCT I-STAT 7, (LYTES, BLD GAS, ICA,H+H)
Acid-base deficit: 2 mmol/L (ref 0.0–2.0)
Bicarbonate: 23.7 mmol/L (ref 20.0–28.0)
Calcium, Ion: 1.13 mmol/L — ABNORMAL LOW (ref 1.15–1.40)
HCT: 33 % — ABNORMAL LOW (ref 39.0–52.0)
Hemoglobin: 11.2 g/dL — ABNORMAL LOW (ref 13.0–17.0)
O2 Saturation: 99 %
Patient temperature: 37.2
Potassium: 3.9 mmol/L (ref 3.5–5.1)
Sodium: 137 mmol/L (ref 135–145)
TCO2: 25 mmol/L (ref 22–32)
pCO2 arterial: 41.6 mmHg (ref 32–48)
pH, Arterial: 7.364 (ref 7.35–7.45)
pO2, Arterial: 128 mmHg — ABNORMAL HIGH (ref 83–108)

## 2022-12-22 LAB — COMPREHENSIVE METABOLIC PANEL
ALT: 69 U/L — ABNORMAL HIGH (ref 0–44)
AST: 45 U/L — ABNORMAL HIGH (ref 15–41)
Albumin: 2.3 g/dL — ABNORMAL LOW (ref 3.5–5.0)
Alkaline Phosphatase: 57 U/L (ref 38–126)
Anion gap: 10 (ref 5–15)
BUN: 36 mg/dL — ABNORMAL HIGH (ref 8–23)
CO2: 22 mmol/L (ref 22–32)
Calcium: 8 mg/dL — ABNORMAL LOW (ref 8.9–10.3)
Chloride: 103 mmol/L (ref 98–111)
Creatinine, Ser: 3.32 mg/dL — ABNORMAL HIGH (ref 0.61–1.24)
GFR, Estimated: 19 mL/min — ABNORMAL LOW (ref >60)
Glucose, Bld: 141 mg/dL — ABNORMAL HIGH (ref 70–99)
Potassium: 3.8 mmol/L (ref 3.5–5.1)
Sodium: 135 mmol/L (ref 135–145)
Total Bilirubin: 1.1 mg/dL (ref 0.3–1.2)
Total Protein: 5.6 g/dL — ABNORMAL LOW (ref 6.5–8.1)

## 2022-12-22 LAB — CBC
HCT: 34.5 % — ABNORMAL LOW (ref 39.0–52.0)
Hemoglobin: 10.9 g/dL — ABNORMAL LOW (ref 13.0–17.0)
MCH: 25.1 pg — ABNORMAL LOW (ref 26.0–34.0)
MCHC: 31.6 g/dL (ref 30.0–36.0)
MCV: 79.5 fL — ABNORMAL LOW (ref 80.0–100.0)
Platelets: 223 10*3/uL (ref 150–400)
RBC: 4.34 MIL/uL (ref 4.22–5.81)
RDW: 14 % (ref 11.5–15.5)
WBC: 17.4 10*3/uL — ABNORMAL HIGH (ref 4.0–10.5)
nRBC: 0 % (ref 0.0–0.2)

## 2022-12-22 LAB — GLUCOSE, CAPILLARY: Glucose-Capillary: 121 mg/dL — ABNORMAL HIGH (ref 70–99)

## 2022-12-22 LAB — TRIGLYCERIDES: Triglycerides: 67 mg/dL (ref <150)

## 2022-12-22 LAB — MAGNESIUM: Magnesium: 2 mg/dL (ref 1.7–2.4)

## 2022-12-22 LAB — HEPARIN LEVEL (UNFRACTIONATED)
Heparin Unfractionated: 0.39 [IU]/mL (ref 0.30–0.70)
Heparin Unfractionated: 0.22 [IU]/mL — ABNORMAL LOW (ref 0.30–0.70)

## 2022-12-22 MED ORDER — LACTATED RINGERS IV SOLN: INTRAVENOUS | Status: DC

## 2022-12-22 MED ORDER — LACTATED RINGERS IV BOLUS
1000.0000 mL | Freq: Once | INTRAVENOUS | Status: AC
  Administered 2022-12-22: 1000 mL via INTRAVENOUS

## 2022-12-22 MED ORDER — POTASSIUM CHLORIDE 10 MEQ/100ML IV SOLN
10.0000 meq | INTRAVENOUS | Status: AC
  Administered 2022-12-22 (×2): 10 meq via INTRAVENOUS
  Filled 2022-12-22: qty 100

## 2022-12-22 MED ORDER — ASPIRIN 300 MG RE SUPP
300.0000 mg | Freq: Every day | RECTAL | Status: DC
  Administered 2022-12-22 – 2023-01-07 (×16): 300 mg via RECTAL
  Filled 2022-12-22 (×18): qty 1

## 2022-12-22 NOTE — Progress Notes (Signed)
NAME:  [name] [name], MRN:  [id number], DOB:  Aug 17, 1955, LOS: 2 ADMISSION DATE:  12/20/2022, CONSULTATION DATE: 9/12 REFERRING MD: Dr. Jacqulyn Bath, CHIEF COMPLAINT: Mesenteric ischemia  History of Present Illness:  Patient is encephalopathic and/or intubated; therefore, history has been obtained from chart review.  67 year old male with past medical history as below, which is significant for intestinal stromal tumor removal in 2022, known mesenteric stenosis, GERD, hypertension, and hyperlipidemia.  In the few weeks prior to presentation on 9/11 he had been evaluated at several campuses for abdominal pain with noncontrasted CTs of the abdomen which were unrevealing.  He continued to have abdominal pain, weight loss, and poor p.o. intake.  He presented [name] on 9/11 and was admitted to the hospitalist for abdominal pain with concern for superior mesenteric artery thrombus.  He was started on heparin infusion.  Vascular surgery was consulted and ultimately a CT angiogram of the abdomen and pelvis was done 9/12 demonstrating acute occlusion of the distal SMA as well as occlusion of the inferior mesenteric artery.  He was taken to the operating room emergently and and was found to have an necrotic area of the jejunum.  This was resected, and SMA stent was placed, and the patient was left with an open abdomen.  He also remained on the mechanical ventilator postoperatively and PCCM was asked to admit for ventilator management.  Pertinent  Medical History   has a past medical history of Ankle fracture, Cancer (HCC) (1974), Cataract, GERD (gastroesophageal reflux disease), colonic polyps, Hyperlipidemia, Hypertension, Kidney stones, Patella fracture, Retinal detachment, Retinal tear of right eye, and Syncopal episodes (2008).   Significant Hospital Events: Including procedures, antibiotic start and stop dates in addition to other pertinent events   9/11 admitted with abdominal pain 9/12 CT demonstrating  mesenteric occlusion, bowel resection and stent placed, to ICU on vent.  Interim History / Subjective:  No events, Bps borderline on sedation.  Objective   Blood pressure (!) 100/57, pulse 66, temperature 98.8 F (37.1 C), resp. rate 15, height 5\' 11"  (1.803 m), weight 96.7 kg, SpO2 100%.    Vent Mode: PRVC FiO2 (%):  [40 %] 40 % Set Rate:  [15 bmp] 15 bmp Vt Set:  [600 mL] 600 mL PEEP:  [5 cmH20] 5 cmH20 Plateau Pressure:  [15 cmH20-17 cmH20] 15 cmH20   Intake/Output Summary (Last 24 hours) at 12/22/2022 0955 Last data filed at 12/22/2022 0800 Gross per 24 hour  Intake 4606.79 ml  Output 1800 ml  Net 2806.79 ml   Filed Weights   12/20/22 1320 12/21/22 0458 12/22/22 0615  Weight: 95.7 kg 98.6 kg 96.7 kg    Examination: Heavily sedated Pupils pinpoint equal Wound vac in place minimal output Abd sounds absent Ext warm Lungs sound clear Groin access site looks fine  Renal function a little worse Stable transaminitis Stable leukocytosis ABG looks okay  Resolved Hospital Problem list     Assessment & Plan:   Acute mesenteric ischemia: Status post small bowel resection and superior mesenteric artery stent placement 9/12.  Open abdomen. Postoperative ventilator management AKI on CKD3: in context of mesenteric ischemia, fluid shifts and likely ATN Postop anemia- expected in context of blood losses from surgery and bone marrow stunning in context of critical illness Protein calorie malnutrition : significant weight loss prior to this admission, now NPO Hx HTN, cataracts, GERD, HLD - vent support, relook tomorrow with potential closure - zosyn for now for translocation until we have assured source control - heparin  gtt - Avoid acidemia, hypothermia - Change NS to LR, high risk to progress to needing HD - TPN consult Monday depending on operative findings and discussion with CCS - Guarded prognosis  Best Practice (right click and "Reselect all SmartList Selections"  daily)   Diet/type: NPO DVT prophylaxis: heparin gtt GI prophylaxis: PPI Lines: Arterial Line Foley:  Yes, and it is still needed Code Status:  full code Last date of multidisciplinary goals of care discussion [pending]  34 min cc time Myrla Halsted MD PCCM  See Loretha Stapler for personal pager PCCM on call pager [phone] until 7pm. Please call Elink 7p-7a. [phone]  12/22/2022 9:55 AM

## 2022-12-22 NOTE — Progress Notes (Signed)
Chart reviewed.  Patient was taken to OR for exploratory laparotomy, small bowel resection and postoperatively, had respiratory failure and remained intubated and transferred to ICU.  Currently still intubated and under care of PCCM.  Hospitalist will sign off.  Please let us know when patient is ready to be transferred to medical floor.

## 2022-12-22 NOTE — Progress Notes (Addendum)
  Progress Note    12/22/2022 7:10 AM 1 Day Post-Op  Subjective:  intubated/sedated  Tm 99.5 now afebrile  Pressors:  none  Abx:  Ancef and Zosyn   Vitals:   12/22/22 0615 12/22/22 0630  BP:    Pulse: 71 71  Resp: 15 15  Temp: 98.8 F (37.1 C) 98.6 F (37 C)  SpO2: 100% 100%    Physical Exam: General:  sedated Cardiac:  regular Lungs:  intubated on .40 FiO2 Incisions:  left groin is soft without hematoma; wound vac to abdomen with good seal. Extremities:  palpable left PT pulse Abdomen:  somewhat distended   CBC    Component Value Date/Time   WBC 17.4 (H) 12/22/2022 0458   RBC 4.34 12/22/2022 0458   HGB 11.2 (L) 12/22/2022 0501   HCT 33.0 (L) 12/22/2022 0501   PLT 223 12/22/2022 0458   MCV 79.5 (L) 12/22/2022 0458   MCH 25.1 (L) 12/22/2022 0458   MCHC 31.6 12/22/2022 0458   RDW 14.0 12/22/2022 0458   LYMPHSABS 0.9 10/30/2022 1654   MONOABS 0.6 10/30/2022 1654   EOSABS 0.3 10/30/2022 1654   BASOSABS 0.0 10/30/2022 1654    BMET    Component Value Date/Time   NA 137 12/22/2022 0501   K 3.9 12/22/2022 0501   CL 103 12/22/2022 0458   CO2 22 12/22/2022 0458   GLUCOSE 141 (H) 12/22/2022 0458   BUN 36 (H) 12/22/2022 0458   CREATININE 3.32 (H) 12/22/2022 0458   CALCIUM 8.0 (L) 12/22/2022 0458   GFRNONAA 19 (L) 12/22/2022 0458   GFRAA >60 04/14/2015 1127    INR No results found for: "INR"   Intake/Output Summary (Last 24 hours) at 12/22/2022 0710 Last data filed at 12/22/2022 0400 Gross per 24 hour  Intake 4846.79 ml  Output 2020 ml  Net 2826.79 ml      Assessment/Plan:  67 y.o. male is s/p:  Aortogram s/p left CFA with percutaneous suction thrombectomy SMA, balloon angioplasty SMA and stenting of proximal SMA 12/21/2022 by Dr. Karin Lieu and exploratory laparotomy with small bowel resection and placement of abthera open abdomen negative pressure dressing 12/21/2022 Dr. Janee Morn 1 Day Post-Op    -AKI-creatinine increased to 3.32 from 2.9 last  evening with UOP 1600cc/24 hr although RN reports UOP has tapered off this morning.  Systolic BP soft.  No currently on any pressor support.   -left groin is soft without hematoma and he has palpable left PT pulse.   -abdomen somewhat distended with wound vac in place and it has good seal -most likely back to OR over the weekend with general surgery -DVT prophylaxis:  heparin gtt   Doreatha Massed, PA-C [facility] [phone] 12/22/2022 7:10 AM  VASCULAR STAFF ADDENDUM: I have independently interviewed and examined the patient. I agree with the above.  Patient with acute on chronic kidney injury-please continue hydration Please continue antibiotics-open abdomen, necrotic bowel Wound VAC in place with good seal, plan for a return tomorrow. Patient maximally revascularized. Patient benefit from CRRT, TPN for nutrition Will discuss with ICU.    Victorino Sparrow MD [facility] Phone Number: [phone] 12/22/2022 1:12 PM

## 2022-12-22 NOTE — Progress Notes (Signed)
Initial Nutrition Assessment  DOCUMENTATION CODES:   Severe malnutrition in context of acute illness/injury  INTERVENTION:   Recommend early initiation of TPN given poor nutritional status/malnutrition on admission with bowel ischemia requiring resection, open abdomen, SB in discontinuity with plans to return to OR this weekend, especially if unlikely to be able to use enteral route soon. Reach out to Dr. Katrinka Blazing, Dr. Freida Busman and Dr. Sherral Hammers with recommendations   NUTRITION DIAGNOSIS:   Severe Malnutrition related to acute illness as evidenced by energy intake < or equal to 50% for > or equal to 5 days, percent weight loss.  GOAL:   Patient will meet greater than or equal to 90% of their needs  MONITOR:   Vent status, Labs, Weight trends  REASON FOR ASSESSMENT:   Ventilator    ASSESSMENT:   67 yo male admitted with SMA stenosis/thrombosis and SB ischemia requiring SMA thrombectomy and stent followed by ex lap and SB resection. Pt with poor po intake and weight loss for several weeks PTA. 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 Wife at bedside who reports pt with 30 pound wt loss in 3 weeks. 11% wt loss. Pt has been eating very little over that time frame due to significant abdominal pain every time he ate  Pt is generally very active, plays tennis several times a week in addition to pickle ball. Wife reports he has been weak recently and has not been able  to play  UOP 1.6 L in 24 hours but <100 mL thus far today, Creatinine rising  Wound VAC with 175 ml thus far today, 300 mL overnight NG with 200 mL out  Labs: phosphorus 4.2 (wdl), potassium 3.9 (wdl), magnesium 2.0 (wdl), Creatinine 3.32, BUN 36 Meds: reviewed  NUTRITION - FOCUSED PHYSICAL EXAM:  Flowsheet Row Most Recent Value  Orbital Region Mild depletion  Upper Arm Region Unable to assess  Thoracic and Lumbar Region No depletion  Buccal Region Unable to assess  Temple Region Mild depletion  Clavicle Bone Region Mild depletion  Clavicle and Acromion Bone Region Mild depletion  Scapular Bone Region Unable to assess  Dorsal Hand Unable to assess  Patellar Region Mild depletion  Anterior Thigh Region Mild depletion  Posterior Calf Region Mild depletion  Edema (RD Assessment) Mild       Diet Order:   Diet Order             Diet NPO time specified  Diet effective now                   EDUCATION NEEDS:   Not appropriate for education at this time  Skin:  Skin Assessment: Skin Integrity Issues: Skin Integrity Issues:: Wound VAC Wound Vac: open abdomen  Last BM:  9/8  Height:   Ht Readings from Last 1 Encounters:  12/20/22 5\' 11"  (1.803 m)    Weight:   Wt Readings from Last 1 Encounters:  12/22/22 96.7 kg    BMI:  Body mass index  is 29.73 kg/m.  Estimated Nutritional Needs:   Kcal:  1900-2100 kacls  Protein:  120-140 g  Fluid:  1.7 L   Romelle Starcher MS, RDN, LDN, CNSC Registered Dietitian 3 Clinical Nutrition RD Pager and On-Call Pager Number Located in [place]

## 2022-12-22 NOTE — Progress Notes (Signed)
eLink Physician-Brief Progress Note Patient Name: [name] [name] DOB: 1955-11-03 MRN: [id number]   Date of Service  12/22/2022  HPI/Events of Note  Patient with oliguria.  eICU Interventions  Will check a BNP to gauge volume status.        Migdalia Dk 12/22/2022, 10:42 PM

## 2022-12-22 NOTE — H&P (View-Only) (Signed)
1 Day Post-Op  Subjective: Taken to OR overnight, ischemic small bowel resected. Remains intubated, hemodynamically stable this morning.   Objective: Vital signs in last 24 hours: Temp:  [98.6 F (37 C)-99.5 F (37.5 C)] 98.6 F (37 C) (09/13 0630) Pulse Rate:  [71-95] 71 (09/13 0630) Resp:  [12-25] 15 (09/13 0630) BP: (105-158)/(62-80) 106/65 (09/13 0600) SpO2:  [97 %-100 %] 100 % (09/13 0630) Arterial Line BP: (103-202)/(49-69) 103/49 (09/13 0630) FiO2 (%):  [40 %] 40 % (09/13 0456) Weight:  [96.7 kg] 96.7 kg (09/13 0615) Last BM Date : 12/17/22  Intake/Output from previous day: 09/12 0701 - 09/13 0700 In: 4846.8 [P.O.:480; I.V.:3702.3; NG/GT:30; IV Piggyback:634.5] Out: 2020 [Urine:1610; Emesis/NG output:100; Drains:300; Blood:10] Intake/Output this shift: No intake/output data recorded.  PE: General: resting in bed, sedated Resp: intubated, on vent Vent Mode: PRVC FiO2 (%):  [40 %] 40 % Set Rate:  [15 bmp] 15 bmp Vt Set:  [600 mL] 600 mL PEEP:  [5 cmH20] 5 cmH20 Plateau Pressure:  [15 cmH20-17 cmH20] 15 cmH20 CV: RRR Abdomen: abdomen open with abthera, serosanguinous drainage Extremities: warm and well-perfused   Lab Results:  Recent Labs    12/21/22 2228 12/21/22 2229 12/22/22 0458 12/22/22 0501  WBC 16.6*  --  17.4*  --   HGB 10.5*   < > 10.9* 11.2*  HCT 33.1*   < > 34.5* 33.0*  PLT 209  --  223  --    < > = values in this interval not displayed.   BMET Recent Labs    12/21/22 2228 12/21/22 2229 12/22/22 0458 12/22/22 0501  NA 136   < > 135 137  K 3.2*   < > 3.8 3.9  CL 101  --  103  --   CO2 21*  --  22  --   GLUCOSE 128*  --  141*  --   BUN 34*  --  36*  --   CREATININE 2.90*  --  3.32*  --   CALCIUM 7.7*  --  8.0*  --    < > = values in this interval not displayed.   PT/INR No results for input(s): "LABPROT", "INR" in the last 72 hours. CMP     Component Value Date/Time   NA 137 12/22/2022 0501   K 3.9 12/22/2022 0501   CL  103 12/22/2022 0458   CO2 22 12/22/2022 0458   GLUCOSE 141 (H) 12/22/2022 0458   BUN 36 (H) 12/22/2022 0458   CREATININE 3.32 (H) 12/22/2022 0458   CALCIUM 8.0 (L) 12/22/2022 0458   PROT 5.6 (L) 12/22/2022 0458   PROT 6.3 03/16/2020 0800   ALBUMIN 2.3 (L) 12/22/2022 0458   ALBUMIN 4.3 03/16/2020 0800   AST 45 (H) 12/22/2022 0458   ALT 69 (H) 12/22/2022 0458   ALKPHOS 57 12/22/2022 0458   BILITOT 1.1 12/22/2022 0458   BILITOT 0.8 03/16/2020 0800   GFRNONAA 19 (L) 12/22/2022 0458   GFRAA >60 04/14/2015 1127   Lipase     Component Value Date/Time   LIPASE 28 11/27/2022 0755      Assessment/Plan 67 yo male with SMA stenosis/thrombosis and small bowel ischemia. S/p SMA thrombectomy and stent 9/12 Dr. Karin Lieu, exploratory laparotomy and small bowel resection Dr. Janee Morn. - Small bowel in discontinuity with open abdomen. Tentatively plan to return to OR tomorrow for re-exploration, possible bowel anastomosis and attempted abdominal closure if no further bowel ischemia. - Strict NPO, no enteral feeds or medications, OG to suction -  On antibiotics for bowel ischemia - Heparin gtt per vascula surgery - General surgery will follow    LOS: 2 days    Sophronia Simas, MD [facility] General, Hepatobiliary and Pancreatic Surgery 12/22/22 8:10 AM

## 2022-12-22 NOTE — Progress Notes (Signed)
1 Day Post-Op  Subjective: Taken to OR overnight, ischemic small bowel resected. Remains intubated, hemodynamically stable this morning.   Objective: Vital signs in last 24 hours: Temp:  [98.6 F (37 C)-99.5 F (37.5 C)] 98.6 F (37 C) (09/13 0630) Pulse Rate:  [71-95] 71 (09/13 0630) Resp:  [12-25] 15 (09/13 0630) BP: (105-158)/(62-80) 106/65 (09/13 0600) SpO2:  [97 %-100 %] 100 % (09/13 0630) Arterial Line BP: (103-202)/(49-69) 103/49 (09/13 0630) FiO2 (%):  [40 %] 40 % (09/13 0456) Weight:  [96.7 kg] 96.7 kg (09/13 0615) Last BM Date : 12/17/22  Intake/Output from previous day: 09/12 0701 - 09/13 0700 In: 4846.8 [P.O.:480; I.V.:3702.3; NG/GT:30; IV Piggyback:634.5] Out: 2020 [Urine:1610; Emesis/NG output:100; Drains:300; Blood:10] Intake/Output this shift: No intake/output data recorded.  PE: General: resting in bed, sedated Resp: intubated, on vent Vent Mode: PRVC FiO2 (%):  [40 %] 40 % Set Rate:  [15 bmp] 15 bmp Vt Set:  [600 mL] 600 mL PEEP:  [5 cmH20] 5 cmH20 Plateau Pressure:  [15 cmH20-17 cmH20] 15 cmH20 CV: RRR Abdomen: abdomen open with abthera, serosanguinous drainage Extremities: warm and well-perfused   Lab Results:  Recent Labs    12/21/22 2228 12/21/22 2229 12/22/22 0458 12/22/22 0501  WBC 16.6*  --  17.4*  --   HGB 10.5*   < > 10.9* 11.2*  HCT 33.1*   < > 34.5* 33.0*  PLT 209  --  223  --    < > = values in this interval not displayed.   BMET Recent Labs    12/21/22 2228 12/21/22 2229 12/22/22 0458 12/22/22 0501  NA 136   < > 135 137  K 3.2*   < > 3.8 3.9  CL 101  --  103  --   CO2 21*  --  22  --   GLUCOSE 128*  --  141*  --   BUN 34*  --  36*  --   CREATININE 2.90*  --  3.32*  --   CALCIUM 7.7*  --  8.0*  --    < > = values in this interval not displayed.   PT/INR No results for input(s): "LABPROT", "INR" in the last 72 hours. CMP     Component Value Date/Time   NA 137 12/22/2022 0501   K 3.9 12/22/2022 0501   CL  103 12/22/2022 0458   CO2 22 12/22/2022 0458   GLUCOSE 141 (H) 12/22/2022 0458   BUN 36 (H) 12/22/2022 0458   CREATININE 3.32 (H) 12/22/2022 0458   CALCIUM 8.0 (L) 12/22/2022 0458   PROT 5.6 (L) 12/22/2022 0458   PROT 6.3 03/16/2020 0800   ALBUMIN 2.3 (L) 12/22/2022 0458   ALBUMIN 4.3 03/16/2020 0800   AST 45 (H) 12/22/2022 0458   ALT 69 (H) 12/22/2022 0458   ALKPHOS 57 12/22/2022 0458   BILITOT 1.1 12/22/2022 0458   BILITOT 0.8 03/16/2020 0800   GFRNONAA 19 (L) 12/22/2022 0458   GFRAA >60 04/14/2015 1127   Lipase     Component Value Date/Time   LIPASE 28 11/27/2022 0755      Assessment/Plan 67 yo male with SMA stenosis/thrombosis and small bowel ischemia. S/p SMA thrombectomy and stent 9/12 Dr. Karin Lieu, exploratory laparotomy and small bowel resection Dr. Janee Morn. - Small bowel in discontinuity with open abdomen. Tentatively plan to return to OR tomorrow for re-exploration, possible bowel anastomosis and attempted abdominal closure if no further bowel ischemia. - Strict NPO, no enteral feeds or medications, OG to suction -  On antibiotics for bowel ischemia - Heparin gtt per vascula surgery - General surgery will follow    LOS: 2 days    Sophronia Simas, MD [facility] Surgery General, Hepatobiliary and Pancreatic Surgery 12/22/22 8:10 AM

## 2022-12-22 NOTE — Plan of Care (Signed)

## 2022-12-22 NOTE — Progress Notes (Addendum)
ANTICOAGULATION CONSULT NOTE - Follow Up  Pharmacy Consult for heparin Indication: SMA stenosis/occlusion  Allergies  Allergen Reactions   Zestril [Lisinopril] Cough    Patient Measurements: Height: 5\' 11"  (180.3 cm) Weight: 96.7 kg (213 lb 3 oz) IBW/kg (Calculated) : 75.3 Heparin Dosing Weight: 94.6kg  Vital Signs: Temp: 98.8 F (37.1 C) (09/13 0900) BP: 100/57 (09/13 0900) Pulse Rate: 66 (09/13 0900)  Labs: Recent Labs    12/21/22 0803 12/21/22 1452 12/21/22 2228 12/21/22 2229 12/22/22 0458 12/22/22 0501  HGB 11.7*  --  10.5* 10.5* 10.9* 11.2*  HCT 37.0*  --  33.1* 31.0* 34.5* 33.0*  PLT 227  --  209  --  223  --   HEPARINUNFRC 0.14* 0.13*  --   --  0.39  --   CREATININE  --  2.89* 2.90*  --  3.32*  --     Estimated Creatinine Clearance: 25.3 mL/min (A) (by C-G formula based on SCr of 3.32 mg/dL (H)).   Assessment: 41 YOM presenting with abdominal pain found to have mesenteric ischemia. Pharmacy consulted for IV heparin. No AC PTA. Pt s/p ex lap, bowel resection, thrombectomy, and angioplasty 9/12, heparin resumed postop.  Abdomen remains open - no bleeding issues, CBC remains stable. Heparin level therapeutic at 0.39 - drawn slightly early overnight so will check confirmatory. Given open abdomen will utilize lower therapeutic range for now.  Goal of Therapy:  Heparin level 0.3-0.5 units/ml Monitor platelets by anticoagulation protocol: Yes   Plan:  Continue heparin 2000 units/h Daily heparin level and CBC Confirmatory heparin level now  ADDENDUM 1215: confirmatory heparin level low at 0.22, will increase infusion slightly to 2150 units/h, recheck with daily labs in am.  Fredonia Highland, PharmD, BCPS, [facility] Clinical Pharmacist [phone] Please check AMION for all [facility] Pharmacy numbers 12/22/2022

## 2022-12-22 NOTE — Progress Notes (Signed)
eLink Physician-Brief Progress Note Patient Name: [name] [name] DOB: [birth year] MRN: [id number]   Date of Service  12/22/2022  HPI/Events of Note  CXR and KUB reviewed.  eICU Interventions  ET tube and OG tube in good position.        [name] [name] 12/22/2022, 12:36 AM

## 2022-12-23 ENCOUNTER — Inpatient Hospital Stay (HOSPITAL_COMMUNITY): Payer: Medicare Other

## 2022-12-23 ENCOUNTER — Inpatient Hospital Stay (HOSPITAL_COMMUNITY): Payer: Medicare Other | Admitting: Anesthesiology

## 2022-12-23 ENCOUNTER — Other Ambulatory Visit: Payer: Self-pay

## 2022-12-23 ENCOUNTER — Encounter (HOSPITAL_COMMUNITY)
Admission: EM | Disposition: A | Discharge status: Rehabilitation Facility | Payer: Self-pay | Source: Home / Self Care | Attending: Internal Medicine

## 2022-12-23 DIAGNOSIS — K56609 Unspecified intestinal obstruction, unspecified as to partial versus complete obstruction: Secondary | ICD-10-CM

## 2022-12-23 DIAGNOSIS — N1831 Chronic kidney disease, stage 3a: Secondary | ICD-10-CM | POA: Diagnosis not present

## 2022-12-23 DIAGNOSIS — I129 Hypertensive chronic kidney disease with stage 1 through stage 4 chronic kidney disease, or unspecified chronic kidney disease: Secondary | ICD-10-CM

## 2022-12-23 DIAGNOSIS — K55059 Acute (reversible) ischemia of intestine, part and extent unspecified: Secondary | ICD-10-CM | POA: Diagnosis not present

## 2022-12-23 DIAGNOSIS — I700 Atherosclerosis of aorta: Secondary | ICD-10-CM | POA: Diagnosis not present

## 2022-12-23 DIAGNOSIS — K551 Chronic vascular disorders of intestine: Secondary | ICD-10-CM | POA: Diagnosis not present

## 2022-12-23 HISTORY — PX: LAPAROTOMY: SHX154

## 2022-12-23 LAB — BASIC METABOLIC PANEL
Anion gap: 10 (ref 5–15)
BUN: 46 mg/dL — ABNORMAL HIGH (ref 8–23)
CO2: 22 mmol/L (ref 22–32)
Calcium: 7.5 mg/dL — ABNORMAL LOW (ref 8.9–10.3)
Chloride: 103 mmol/L (ref 98–111)
Creatinine, Ser: 4.99 mg/dL — ABNORMAL HIGH (ref 0.61–1.24)
GFR, Estimated: 12 mL/min — ABNORMAL LOW (ref >60)
Glucose, Bld: 125 mg/dL — ABNORMAL HIGH (ref 70–99)
Potassium: 3.8 mmol/L (ref 3.5–5.1)
Sodium: 135 mmol/L (ref 135–145)

## 2022-12-23 LAB — PHOSPHORUS: Phosphorus: 5.3 mg/dL — ABNORMAL HIGH (ref 2.5–4.6)

## 2022-12-23 LAB — CBC
HCT: 30.1 % — ABNORMAL LOW (ref 39.0–52.0)
Hemoglobin: 9.6 g/dL — ABNORMAL LOW (ref 13.0–17.0)
MCH: 25.6 pg — ABNORMAL LOW (ref 26.0–34.0)
MCHC: 31.9 g/dL (ref 30.0–36.0)
MCV: 80.3 fL (ref 80.0–100.0)
Platelets: 187 10*3/uL (ref 150–400)
RBC: 3.75 MIL/uL — ABNORMAL LOW (ref 4.22–5.81)
RDW: 14.4 % (ref 11.5–15.5)
WBC: 7.6 10*3/uL (ref 4.0–10.5)
nRBC: 0 % (ref 0.0–0.2)

## 2022-12-23 LAB — HEPARIN LEVEL (UNFRACTIONATED)
Heparin Unfractionated: 0.13 [IU]/mL — ABNORMAL LOW (ref 0.30–0.70)
Heparin Unfractionated: 0.1 [IU]/mL — ABNORMAL LOW (ref 0.30–0.70)

## 2022-12-23 LAB — GLUCOSE, CAPILLARY
Glucose-Capillary: 114 mg/dL — ABNORMAL HIGH (ref 70–99)
Glucose-Capillary: 115 mg/dL — ABNORMAL HIGH (ref 70–99)
Glucose-Capillary: 134 mg/dL — ABNORMAL HIGH (ref 70–99)
Glucose-Capillary: 134 mg/dL — ABNORMAL HIGH (ref 70–99)
Glucose-Capillary: 143 mg/dL — ABNORMAL HIGH (ref 70–99)

## 2022-12-23 LAB — MAGNESIUM: Magnesium: 1.9 mg/dL (ref 1.7–2.4)

## 2022-12-23 LAB — BRAIN NATRIURETIC PEPTIDE: B Natriuretic Peptide: 17.4 pg/mL (ref 0.0–100.0)

## 2022-12-23 SURGERY — LAPAROTOMY, EXPLORATORY
Anesthesia: General | Site: Abdomen

## 2022-12-23 MED ORDER — PHENYLEPHRINE HCL-NACL 20-0.9 MG/250ML-% IV SOLN
INTRAVENOUS | Status: DC | PRN
  Administered 2022-12-23: 30 ug/min via INTRAVENOUS

## 2022-12-23 MED ORDER — HEPARIN SODIUM (PORCINE) 1000 UNIT/ML DIALYSIS
1000.0000 [IU] | INTRAMUSCULAR | Status: DC | PRN
  Administered 2022-12-23: 2400 [IU] via INTRAVENOUS_CENTRAL
  Filled 2022-12-23: qty 4
  Filled 2022-12-23: qty 6
  Filled 2022-12-23: qty 3
  Filled 2022-12-23: qty 5

## 2022-12-23 MED ORDER — HEPARIN (PORCINE) 25000 UT/250ML-% IV SOLN
2750.0000 [IU]/h | INTRAVENOUS | Status: DC
  Administered 2022-12-23: 2150 [IU]/h via INTRAVENOUS
  Administered 2022-12-24: 2550 [IU]/h via INTRAVENOUS
  Administered 2022-12-24: 2350 [IU]/h via INTRAVENOUS
  Administered 2022-12-24: 2550 [IU]/h via INTRAVENOUS
  Administered 2022-12-25: 2750 [IU]/h via INTRAVENOUS
  Filled 2022-12-23 (×4): qty 250

## 2022-12-23 MED ORDER — PROPOFOL 10 MG/ML IV BOLUS
INTRAVENOUS | Status: AC
  Filled 2022-12-23: qty 20

## 2022-12-23 MED ORDER — 0.9 % SODIUM CHLORIDE (POUR BTL) OPTIME
TOPICAL | Status: DC | PRN
  Administered 2022-12-23: 1000 mL

## 2022-12-23 MED ORDER — MIDAZOLAM HCL 2 MG/2ML IJ SOLN
INTRAMUSCULAR | Status: AC
  Filled 2022-12-23: qty 2

## 2022-12-23 MED ORDER — EPINEPHRINE 1 MG/10ML IJ SOSY
PREFILLED_SYRINGE | INTRAMUSCULAR | Status: AC
  Filled 2022-12-23: qty 10

## 2022-12-23 MED ORDER — LACTATED RINGERS IV BOLUS
1000.0000 mL | Freq: Once | INTRAVENOUS | Status: AC
  Administered 2022-12-23: 1000 mL via INTRAVENOUS

## 2022-12-23 MED ORDER — FENTANYL CITRATE (PF) 250 MCG/5ML IJ SOLN
INTRAMUSCULAR | Status: DC | PRN
  Administered 2022-12-23: 100 ug via INTRAVENOUS
  Administered 2022-12-23: 50 ug via INTRAVENOUS

## 2022-12-23 MED ORDER — MIDAZOLAM HCL 2 MG/2ML IJ SOLN
INTRAMUSCULAR | Status: DC | PRN
  Administered 2022-12-23: 2 mg via INTRAVENOUS

## 2022-12-23 MED ORDER — PIPERACILLIN-TAZOBACTAM IN DEX 2-0.25 GM/50ML IV SOLN
2.2500 g | Freq: Three times a day (TID) | INTRAVENOUS | Status: AC
  Administered 2022-12-23 – 2022-12-28 (×17): 2.25 g via INTRAVENOUS
  Filled 2022-12-23 (×18): qty 50

## 2022-12-23 MED ORDER — FUROSEMIDE 10 MG/ML IJ SOLN
60.0000 mg | Freq: Once | INTRAMUSCULAR | Status: AC
  Administered 2022-12-23: 60 mg via INTRAVENOUS
  Filled 2022-12-23: qty 6

## 2022-12-23 MED ORDER — PHENYLEPHRINE 80 MCG/ML (10ML) SYRINGE FOR IV PUSH (FOR BLOOD PRESSURE SUPPORT)
PREFILLED_SYRINGE | INTRAVENOUS | Status: DC | PRN
  Administered 2022-12-23: 120 ug via INTRAVENOUS
  Administered 2022-12-23 (×2): 160 ug via INTRAVENOUS

## 2022-12-23 MED ORDER — LACTATED RINGERS IV SOLN: INTRAVENOUS | Status: DC

## 2022-12-23 MED ORDER — INSULIN ASPART 100 UNIT/ML IJ SOLN
0.0000 [IU] | INTRAMUSCULAR | Status: DC
  Administered 2022-12-23 – 2022-12-26 (×11): 1 [IU] via SUBCUTANEOUS
  Administered 2022-12-26 – 2022-12-27 (×3): 2 [IU] via SUBCUTANEOUS
  Administered 2022-12-27: 1 [IU] via SUBCUTANEOUS
  Administered 2022-12-27: 2 [IU] via SUBCUTANEOUS
  Administered 2022-12-27 (×2): 1 [IU] via SUBCUTANEOUS
  Administered 2022-12-28: 2 [IU] via SUBCUTANEOUS
  Administered 2022-12-28: 1 [IU] via SUBCUTANEOUS
  Administered 2022-12-28 (×4): 2 [IU] via SUBCUTANEOUS
  Administered 2022-12-29: 3 [IU] via SUBCUTANEOUS
  Administered 2022-12-29 (×2): 2 [IU] via SUBCUTANEOUS

## 2022-12-23 MED ORDER — CLEVIDIPINE BUTYRATE 0.5 MG/ML IV EMUL
0.0000 mg/h | INTRAVENOUS | Status: DC
  Administered 2022-12-23: 2 mg/h via INTRAVENOUS
  Filled 2022-12-23: qty 100

## 2022-12-23 MED ORDER — ALBUMIN HUMAN 5 % IV SOLN: INTRAVENOUS | Status: DC | PRN

## 2022-12-23 MED ORDER — LACTATED RINGERS IV BOLUS
500.0000 mL | Freq: Once | INTRAVENOUS | Status: AC
  Administered 2022-12-23: 500 mL via INTRAVENOUS

## 2022-12-23 MED ORDER — ROCURONIUM BROMIDE 10 MG/ML (PF) SYRINGE
PREFILLED_SYRINGE | INTRAVENOUS | Status: DC | PRN
  Administered 2022-12-23: 80 mg via INTRAVENOUS

## 2022-12-23 MED ORDER — FENTANYL CITRATE (PF) 250 MCG/5ML IJ SOLN
INTRAMUSCULAR | Status: AC
  Filled 2022-12-23: qty 5

## 2022-12-23 MED ORDER — HEPARIN (PORCINE) 25000 UT/250ML-% IV SOLN
INTRAVENOUS | Status: DC | PRN
Start: 2022-12-23 — End: 2022-12-23
  Administered 2022-12-23: 21.5 [IU]/h via INTRAVENOUS

## 2022-12-23 MED ORDER — TRAVASOL 10 % IV SOLN
INTRAVENOUS | Status: AC
  Filled 2022-12-23: qty 504.8

## 2022-12-23 SURGICAL SUPPLY — 45 items
APL PRP STRL LF DISP 70% ISPRP (MISCELLANEOUS)
BAG COUNTER SPONGE SURGICOUNT (BAG) ×1 IMPLANT
BAG SPNG CNTER NS LX DISP (BAG) ×1
BLADE CLIPPER SURG (BLADE) IMPLANT
CANISTER SUCT 3000ML PPV (MISCELLANEOUS) ×1 IMPLANT
CANISTER WOUNDNEG PRESSURE 500 (CANNISTER) IMPLANT
CHLORAPREP W/TINT 26 (MISCELLANEOUS) ×1 IMPLANT
COVER SURGICAL LIGHT HANDLE (MISCELLANEOUS) ×1 IMPLANT
DRAPE LAPAROSCOPIC ABDOMINAL (DRAPES) ×1 IMPLANT
DRAPE WARM FLUID 44X44 (DRAPES) ×1 IMPLANT
DRSG OPSITE POSTOP 4X10 (GAUZE/BANDAGES/DRESSINGS) IMPLANT
DRSG OPSITE POSTOP 4X8 (GAUZE/BANDAGES/DRESSINGS) IMPLANT
ELECT BLADE 6.5 EXT (BLADE) IMPLANT
ELECT CAUTERY BLADE 6.4 (BLADE) ×1 IMPLANT
ELECT REM PT RETURN 9FT ADLT (ELECTROSURGICAL) ×1
ELECTRODE REM PT RTRN 9FT ADLT (ELECTROSURGICAL) ×1 IMPLANT
GLOVE BIO SURGEON STRL SZ 6 (GLOVE) ×1 IMPLANT
GLOVE INDICATOR 6.5 STRL GRN (GLOVE) ×1 IMPLANT
GOWN STRL REUS W/ TWL LRG LVL3 (GOWN DISPOSABLE) ×1 IMPLANT
GOWN STRL REUS W/ TWL XL LVL3 (GOWN DISPOSABLE) ×1 IMPLANT
GOWN STRL REUS W/TWL LRG LVL3 (GOWN DISPOSABLE) ×1
GOWN STRL REUS W/TWL XL LVL3 (GOWN DISPOSABLE) ×1
HANDLE SUCTION POOLE (INSTRUMENTS) ×1 IMPLANT
KIT BASIN OR (CUSTOM PROCEDURE TRAY) ×1 IMPLANT
KIT TURNOVER KIT B (KITS) ×1 IMPLANT
LIGASURE IMPACT 36 18CM CVD LR (INSTRUMENTS) IMPLANT
NS IRRIG 1000ML POUR BTL (IV SOLUTION) ×2 IMPLANT
PACK GENERAL/GYN (CUSTOM PROCEDURE TRAY) ×1 IMPLANT
PAD ARMBOARD 7.5X6 YLW CONV (MISCELLANEOUS) ×1 IMPLANT
PENCIL SMOKE EVACUATOR (MISCELLANEOUS) ×1 IMPLANT
SPECIMEN JAR LARGE (MISCELLANEOUS) IMPLANT
SPONGE ABD ABTHERA ADVANCE (MISCELLANEOUS) IMPLANT
SPONGE T-LAP 18X18 ~~LOC~~+RFID (SPONGE) IMPLANT
STAPLER VISISTAT 35W (STAPLE) ×1 IMPLANT
SUCTION POOLE HANDLE (INSTRUMENTS) ×1
SUT PDS II 0 TP-1 LOOPED 60 (SUTURE) ×2 IMPLANT
SUT VIC AB 2-0 SH 18 (SUTURE) ×1 IMPLANT
SUT VIC AB 3-0 SH 18 (SUTURE) ×1 IMPLANT
SUT VICRYL 4-0 PS2 18IN ABS (SUTURE) IMPLANT
SUT VICRYL AB 2 0 TIES (SUTURE) ×1 IMPLANT
SUT VICRYL AB 3 0 TIES (SUTURE) ×1 IMPLANT
TOWEL GREEN STERILE (TOWEL DISPOSABLE) ×1 IMPLANT
TOWEL GREEN STERILE FF (TOWEL DISPOSABLE) ×1 IMPLANT
TRAY FOLEY MTR SLVR 16FR STAT (SET/KITS/TRAYS/PACK) IMPLANT
YANKAUER SUCT BULB TIP NO VENT (SUCTIONS) IMPLANT

## 2022-12-23 NOTE — Procedures (Signed)
Central Venous Catheter Insertion Procedure Note  [name] [name]  [id number]  June 30, 1955  Date:12/23/22  Time:1:58 PM   Provider Performing:Luisfernando Brightwell Salena Saner Katrinka Blazing   Procedure: Insertion of Non-tunneled Central Venous [phone]) with US guidance ([id number])   Indication(s) Medication administration  Consent Risks of the procedure as well as the alternatives and risks of each were explained to the patient and/or caregiver.  Consent for the procedure was obtained and is signed in the bedside chart  Anesthesia Topical only with 1% lidocaine   Timeout Verified patient identification, verified procedure, site/side was marked, verified correct patient position, special equipment/implants available, medications/allergies/relevant history reviewed, required imaging and test results available.  Sterile Technique Maximal sterile technique including full sterile barrier drape, hand hygiene, sterile gown, sterile gloves, mask, hair covering, sterile ultrasound probe cover (if used).  Procedure Description Area of catheter insertion was cleaned with chlorhexidine and draped in sterile fashion.  With real-time ultrasound guidance a central venous catheter was placed into the left internal jugular vein. Nonpulsatile blood flow and easy flushing noted in all ports.  The catheter was sutured in place and sterile dressing applied.  Complications/Tolerance None; patient tolerated the procedure well. Chest X-ray is ordered to verify placement for internal jugular or subclavian cannulation.   Chest x-ray is not ordered for femoral cannulation.  EBL Minimal  Specimen(s) None

## 2022-12-23 NOTE — Interval H&P Note (Signed)
History and Physical Interval Note:  12/23/2022 7:57 AM  [name] [name]  has presented today for surgery, with the diagnosis of SMALL BOWEL OBSTRUCTION.  The patient has consented to  Procedure(s): RE-EXPLORATION LAPAROTOMY POSSIBLE BOWEL RESECTION (N/A) as a surgical intervention.  The patient's history has been reviewed, patient examined, no change in status, stable for surgery.  I have reviewed the patient's chart and labs.  Questions were answered to the patient's satisfaction.     Almond Lint

## 2022-12-23 NOTE — Progress Notes (Signed)
[facility] [place]  Subjective  -seen in the OR with marginal bowel as dictated.   Objective (!) 90/53 62 98.2 F (36.8 C) 15 98%  Intake/Output Summary (Last 24 hours) at 12/23/2022 0843 Last data filed at 12/23/2022 0825 Gross per 24 hour  Intake 6392.92 ml  Output 1263 ml  Net 5129.92 ml    Abdomen open with ABThera  Laboratory Lab Results: Recent Labs    12/22/22 0458 12/22/22 0501 12/23/22 0516  WBC 17.4*  --  7.6  HGB 10.9* 11.2* 9.6*  HCT 34.5* 33.0* 30.1*  PLT 223  --  187   BMET Recent Labs    12/22/22 0458 12/22/22 0501 12/23/22 0516  NA 135 137 135  K 3.8 3.9 3.8  CL 103  --  103  CO2 22  --  22  GLUCOSE 141*  --  125*  BUN 36*  --  46*  CREATININE 3.32*  --  4.99*  CALCIUM 8.0*  --  7.5*    COAG No results found for: "INR", "PROTIME" No results found for: "PTT"  Assessment/Planning:  67 year old male presented with acute mesenteric ischemia requiring ex lap with small bowel resection and SMA stenting.  On exploration today about 70-80 cm small bowel marginal although not frankly necrotic.  There was good Doppler flow in the proximal SMA.  Discussed with general surgery would like to hold Plavix until reexploration on Monday.  Heparin restarted in the OR today.  Please continue heparin.  White blood cell count improved to 7.6.  Worsening renal function now with a creatinine 4.99 minimal urine output last 24 hours only to 218 mL recorded.  Continue zosyn.    [name] [name] 12/23/2022 8:43 AM --

## 2022-12-23 NOTE — Progress Notes (Addendum)
PHARMACY - TOTAL PARENTERAL NUTRITION CONSULT NOTE   Indication:  SMA syndrome w associated weight loss  Patient Measurements: Height: 5\' 11"  (180.3 cm) Weight: 96.7 kg (213 lb 3 oz) IBW/kg (Calculated) : 75.3 TPN AdjBW (KG): 80.4 Body mass index is 29.73 kg/m. Usual Weight: 95 kg, has lost 50lbs recently   Assessment:  67 yo M admitted with abdominal pain, weight loss, and poor PO intake. 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 He is now s/p SMA stent placement. He is currently left is discontinuity w open abdomen.  Glucose / Insulin: A1c 5.3. No SSI.  Electrolytes: K 3.8, Phos 5.3. Ca 7.5 Renal: Scr 4.99, BUN 46 Hepatic: Alb 2.3, LFT downtrending. Tbili 1.1  Intake / Output; MIVF: 0.1 ml/kg/hr, NGT output, 775 ml from wound vac. +9.3L this admit.  LR@125  GI Imaging: GI Surgeries / Procedures:  9/14 re-ex lap. Left in discontinuity with plans for re-exploration on Monday.  Central access: planned 9/14 PM  TPN start date: 12/23/22  Nutritional Goals: Goal TPN rate is 90 mL/hr (provides 130 g of protein and 2092 kcals per day)  RD Assessment: Estimated Needs Total Energy Estimated Needs: 1900-2100 kacls Total Protein Estimated Needs: 120-140 g Total Fluid Estimated Needs: 1.7 L  Current Nutrition:  NPO Propofol at 9mcg/kg/min (57ml/hr) >>369 kcal/day   Plan:  Start TPN at 35 mL/hr at 1800 - to provide 40% of estimated needs (plus propofol kcal = 1183 kcal/day, 60% of estimated need for today) Electrolytes in TPN: Na 79mEq/L, K 25 mEq/L, Ca 37mEq/L, Mg 10mEq/L, and Phos 7mmol/L. Cl:Ac 1:2 Add standard MVI and trace elements to TPN, no chromium d/t shortage Initiate Sensitive q4h SSI and adjust as needed  Reduce MIVF to 90 mL/hr at 1800 Monitor TPN labs on Mon/Thurs, daily until stable F/u propofol use and operative plans ?monday  Calton Dach, PharmD, BCCCP Clinical Pharmacist 12/23/2022  10:12 AM

## 2022-12-23 NOTE — Progress Notes (Signed)
ANTICOAGULATION CONSULT NOTE - Follow Up  Pharmacy Consult for heparin Indication: SMA stenosis/occlusion  Allergies  Allergen Reactions   Zestril [Lisinopril] Cough    Patient Measurements: Height: 5\' 11"  (180.3 cm) Weight: 96.7 kg (213 lb 3 oz) IBW/kg (Calculated) : 75.3 Heparin Dosing Weight: 94.6kg  Vital Signs: Temp: 99.1 F (37.3 C) (09/14 2300) BP: 105/60 (09/14 2300) Pulse Rate: 80 (09/14 2300)  Labs: Recent Labs    12/21/22 2228 12/21/22 2229 12/22/22 0458 12/22/22 0501 12/22/22 1014 12/23/22 0516 12/23/22 2315  HGB 10.5*   < > 10.9* 11.2*  --  9.6*  --   HCT 33.1*   < > 34.5* 33.0*  --  30.1*  --   PLT 209  --  223  --   --  187  --   HEPARINUNFRC  --   --  0.39  --  0.22* 0.13* 0.10*  CREATININE 2.90*  --  3.32*  --   --  4.99*  --    < > = values in this interval not displayed.    Estimated Creatinine Clearance: 16.8 mL/min (A) (by C-G formula based on SCr of 4.99 mg/dL (H)).   Assessment: 62 YOM presenting with abdominal pain found to have mesenteric ischemia. Pharmacy consulted for IV heparin. No AC PTA. Pt s/p ex lap, bowel resection, thrombectomy, and angioplasty 9/12, heparin resumed postop.  Abdomen remains open - no bleeding issues. Heparin was restarted intra-op per surgery at previous rate of 1250 units/hr. Given open abdomen will utilize lower therapeutic range for now.  Heparin level subtherapeutic (0.1) on infusion at 2150 units/hr. No issues with line reported per RN. Pt with serosanguinous drainage from abd wound.  Goal of Therapy:  Heparin level 0.3-0.5 units/ml Monitor platelets by anticoagulation protocol: Yes   Plan:  Increase heparin infusion to 2350 units/hr  F/u 8 hr heparin level  Thank you for allowing pharmacy to participate in this patient's care,  Christoper Fabian, PharmD, BCPS Please see amion for complete clinical pharmacist phone list 12/23/2022 11:53 PM

## 2022-12-23 NOTE — Consult Note (Signed)
[facility] Nephrology Consultation Note  Requesting MD: Dr. Levon Hedger Reason for consult: AKI on CKD  HPI:  [name] [name] is a 67 y.o. male.  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 Apparently the patient has had multiple investigation for abdominal pain, weight loss and poor oral intake.  He presented to the ER on 9/11 with a concern of superior mesenteric artery thrombus.  He was treated with heparin infusion and vascular surgery was obtained.  He had CT angio of abdomen pelvis on 9/12 demonstrating acute occlusion of distal SMA and inferior mesenteric artery.  He was taken to the OR urgently however found to have necrotic area of chest edema.  The jejunum was resected and SMA stent was placed.  He was also intubated for respiratory failure and currently admitted in ICU. For CKD, it seems like it was contributed due to chemo and reportedly the recent baseline creatinine level is around 2.  He follows with Dr. Glenna Fellows at [facility], as per his wife who was presented with the patient. On admission, the creatinine level was around 3 which was worsened to 4.99 today associated with declining urine output.  Urine output is around 250 since 7 AM this morning. He is currently on clevidipine drip, heparin IV, fentanyl, LR infusion and Zosyn.  Patient received around 500 cc of LR bolus earlier today. He is intubated sedated therefore unable to obtain review of system.  Discussed with the patient's wife, nurse and the primary team today as well.  PMHx:   Past Medical History:  Diagnosis Date   Ankle fracture    Cancer (HCC) 1974   testicular cancer-at age 3   Cataract    bilateral sx   GERD (gastroesophageal reflux disease)    hx of   Hx of  colonic polyps    pt unsure,thinks this was done in 1982 in [place]. no reports in EPIC   Hyperlipidemia    on meds   Hypertension    on meds   Kidney stones    Patella fracture    Retinal detachment    OD   Retinal tear of right eye    Syncopal episodes 2008    Past Surgical History:  Procedure Laterality Date   AIR/FLUID EXCHANGE Right 06/16/2014   Procedure: AIR/FLUID EXCHANGE;  Surgeon: Sherrie George, MD;  Location: [facility] OR;  Service: Ophthalmology;  Laterality: Right;   ANGIOPLASTY Left 12/21/2022   Procedure: BALLOON ANGIOPLASTY SUPERIOR MESENTERIC ARTERY;  Surgeon: Victorino Sparrow, MD;  Location: [facility] OR;  Service: Vascular;  Laterality: Left;   APPLICATION OF WOUND VAC N/A 12/21/2022   Procedure: APPLICATION OF ABTHERA WOUND VAC;  Surgeon: Violeta Gelinas, MD;  Location: [facility] OR;  Service: General;  Laterality: N/A;   BIOPSY  04/04/2021   Procedure: BIOPSY;  Surgeon: Lemar Lofty., MD;  Location: [facility] ENDOSCOPY;  Service: Gastroenterology;;   BOWEL RESECTION  12/21/2022   Procedure: SMALL BOWEL RESECTION;  Surgeon: Violeta Gelinas, MD;  Location: [facility] OR;  Service: General;;   CATARACT EXTRACTION     CATARACT EXTRACTION  09/2014   rt eye//left 01/2015   CATARACT EXTRACTION, BILATERAL  2016   per Dr. Elmer Picker    COLONOSCOPY  02/16/2020   per Dr. Rhea Belton, adenomatous polyps, repeat in 3 yrs   ENTEROSCOPY N/A  04/04/2021   Procedure: ENTEROSCOPY;  Surgeon: Meridee Score Netty Starring., MD;  Location: [facility] ENDOSCOPY;  Service: Gastroenterology;  Laterality: N/A;   EYE SURGERY     GAS INSERTION Right 06/16/2014   Procedure: INSERTION OF GAS;  Surgeon: Sherrie George, MD;  Location: [facility] OR;  Service: Ophthalmology;  Laterality: Right;  [id number]   GIVENS CAPSULE STUDY N/A 04/02/2021   Procedure: GIVENS CAPSULE STUDY;  Surgeon: Lemar Lofty., MD;  Location: [facility] ENDOSCOPY;  Service: Gastroenterology;  Laterality: N/A;   HYDROCELE EXCISION     INSERTION OF ILIAC STENT N/A 12/21/2022    Procedure: INSERTION OF SUPERIOR MESENTERIC ARTERY STENT;  Surgeon: Victorino Sparrow, MD;  Location: [facility] OR;  Service: Vascular;  Laterality: N/A;   IR ANGIOGRAM VISCERAL SELECTIVE  04/04/2021   IR US GUIDE VASC ACCESS LEFT  04/04/2021   KNEE ARTHROSCOPY Right    x 2   LAPAROTOMY N/A 12/21/2022   Procedure: EXPLORATION LAPAROTOMY;  Surgeon: Violeta Gelinas, MD;  Location: [facility] OR;  Service: General;  Laterality: N/A;   LASER PHOTO ABLATION Right 06/16/2014   Procedure: LASER PHOTO ABLATION;  Surgeon: Sherrie George, MD;  Location: [facility] OR;  Service: Ophthalmology;  Laterality: Right;  Headscope laser and endolaser    ORCHIECTOMY     right testicle   REPAIR OF COMPLEX TRACTION RETINAL DETACHMENT Right 06/16/2014   Procedure: REPAIR OF COMPLEX TRACTION RETINAL DETACHMENT;  Surgeon: Sherrie George, MD;  Location: [facility] OR;  Service: Ophthalmology;  Laterality: Right;   RETINAL DETACHMENT SURGERY  06/2014   SUBMUCOSAL TATTOO INJECTION  04/04/2021   Procedure: SUBMUCOSAL TATTOO INJECTION;  Surgeon: Lemar Lofty., MD;  Location: [facility] ENDOSCOPY;  Service: Gastroenterology;;   TONSILLECTOMY     ULTRASOUND GUIDANCE FOR VASCULAR ACCESS Left 12/21/2022   Procedure: ULTRASOUND GUIDANCE FOR VASCULAR ACCESS FEMORAL ARTER;  Surgeon: Victorino Sparrow, MD;  Location: [facility] OR;  Service: Vascular;  Laterality: Left;   VISCERAL ANGIOGRAM Left 12/21/2022   Procedure: SUPERIOR MESENTERIC ARTERY ANGIOGRAM;  Surgeon: Victorino Sparrow, MD;  Location: [facility] OR;  Service: Vascular;  Laterality: Left;   VITRECTOMY Right 06/16/2014   WISDOM TOOTH EXTRACTION      Family Hx:  Family History  Problem Relation Age of Onset   Parkinson's disease Mother    Arrhythmia Father    Coronary artery disease Other        fhx   Hypertension Other        fhx   Sudden death Other        fhx   Colon cancer Neg Hx    Colon polyps Neg Hx    Esophageal cancer Neg Hx    Rectal cancer Neg Hx    Stomach cancer Neg Hx     Social  History:  reports that he has never smoked. He has never used smokeless tobacco. He reports that he does not currently use alcohol. He reports that he does not use drugs.  Allergies:  Allergies  Allergen Reactions   Zestril [Lisinopril] Cough    Medications: Prior to Admission medications   Medication Sig Start Date End Date Taking? Authorizing Provider  chlorthalidone (HYGROTON) 25 MG tablet Take 12.5 mg by mouth every morning. 11/17/22  Yes [provider]  cloNIDine (CATAPRES) 0.2 MG tablet Take 1 tablet (0.2 mg total) by mouth 2 (two) times daily. 11/21/22  Yes [name], MD  hydrALAZINE (APRESOLINE) 50 MG tablet Take 1 tablet (50 mg total) by mouth 3 (three) times daily.  11/21/22  Yes [name], MD  HYDROcodone-acetaminophen (NORCO) 10-325 MG tablet Take 1 tablet by mouth every 6 (six) hours as needed for moderate pain. 12/06/22  Yes [name], MD  losartan (COZAAR) 100 MG tablet Take 1 tablet (100 mg total) by mouth daily. 11/20/22  Yes [name], MD  ondansetron (ZOFRAN-ODT) 4 MG disintegrating tablet Take 4 mg by mouth every 8 (eight) hours as needed for vomiting or nausea. 11/30/22  Yes [provider]  rosuvastatin (CRESTOR) 40 MG tablet TAKE 1 TABLET BY MOUTH EVERY DAY 11/02/22  Yes [name], MD  sildenafil (VIAGRA) 100 MG tablet Take 1 tablet (100 mg total) by mouth as needed for erectile dysfunction. 07/11/22  Yes [name], MD  Syringe/Needle, Disp, (SYRINGE 3CC/22GX1-1/2") 22G X 1-1/2" 3 ML MISC 1 Application by Does not apply route every 14 (fourteen) days. 07/13/22   [name], MD  testosterone cypionate (DEPOTESTOSTERONE CYPIONATE) 200 MG/ML injection Inject 1 mL (200 mg total) into the muscle every 14 (fourteen) days. Patient not taking: Reported on 12/20/2022 08/29/22   [name], MD    I have reviewed the patient's current medications.  Labs: Renal Panel: Recent Labs  Lab 12/20/22 2318 12/21/22 1452 12/21/22 2228  12/21/22 2229 12/22/22 0458 12/22/22 0501 12/23/22 0516  NA 138 137 136 138 135 137 135  K 3.2* 3.3* 3.2* 3.4* 3.8 3.9 3.8  CL 100 101 101  --  103  --  103  CO2 27 23 21*  --  22  --  22  GLUCOSE 134* 104* 128*  --  141*  --  125*  BUN 37* 37* 34*  --  36*  --  46*  CREATININE 2.90* 2.89* 2.90*  --  3.32*  --  4.99*  CALCIUM 8.6* 8.1* 7.7*  --  8.0*  --  7.5*  MG 1.6*  --  1.4*  --  2.0  --  1.9  PHOS  --   --  4.2  --   --   --  5.3*     CBC:    Latest Ref Rng & Units 12/23/2022    5:16 AM 12/22/2022    5:01 AM 12/22/2022    4:58 AM  CBC  WBC 4.0 - 10.5 K/uL 7.6   17.4   Hemoglobin 13.0 - 17.0 g/dL 9.6  16.1  09.6   Hematocrit 39.0 - 52.0 % 30.1  33.0  34.5   Platelets 150 - 400 K/uL 187   223      Anemia Panel:  Recent Labs    12/21/22 2228 12/21/22 2229 12/22/22 0458 12/22/22 0501 12/23/22 0516  HGB 10.5* 10.5* 10.9* 11.2* 9.6*  MCV 79.2*  --  79.5*  --  80.3    Recent Labs  Lab 12/20/22 1441 12/20/22 2318 12/22/22 0458  AST 74* 65* 45*  ALT 105* 95* 69*  ALKPHOS 67 61 57  BILITOT 1.2 1.0 1.1  PROT 6.8 5.9* 5.6*  ALBUMIN 3.1* 2.8* 2.3*    Lab Results  Component Value Date   HGBA1C 5.3 10/07/2020    ROS: Unable to obtain review of system as patient is intubated and sedated.  Physical Exam: Vitals:   12/23/22 1000 12/23/22 1049  BP: (!) 156/76   Pulse: (!) 107 (!) 107  Resp: 15 18  Temp: (!) 97.2 F (36.2 C)   SpO2: 98% 98%     General exam: Intubated, sedated. Respiratory system: Coarse breath sound bilateral cardiovascular  system: S1 & S2 heard, RRR.  Trace pedal edema. Gastrointestinal system: Abdomen wound has dressing on. Central nervous system: Sedated Extremities: Trace peripheral edema, no cyanosis Skin: No rashes, lesions or ulcers Psychiatry: Unable to assess as patient is currently on sedation and pain management.  Assessment/Plan:  # Acute kidney injury on CKD 3B: Baseline creatinine level around 2.0.  Acute kidney injury  likely ischemic ATN due to ex lap surgery/hypotension/IV contrast with hemodynamic changes. Patient received LR bolus earlier today and currently on multiple IV drips.  I will discontinue further IV fluid.  Order a dose of Lasix today and monitor urine output. He will likely need  renal replacement therapy (IHD vs CRRT) in next 1 to 2 days.  I have reviewed this with the patient's wife and she agrees with dialysis/CRRT when needed.  Also discussed with the primary team. Strict ins and out and close lab monitoring.  # Acute mesenteric ischemia: Status post small bowel resection and superior mesenteric artery stent placement.  Followed by vascular surgeon and currently on heparin.  # Vent dependent respiratory failure: Per PCCM.  # Anemia likely due to blood loss: Monitor CBC and transfuse as as per ICU's discretion.  # History of hypertension: Patient required pressors for hypotension in OR.  Currently BP acceptable.  Thank you for the consult.  We will continue to follow.  Dustin Burrill Jaynie Collins 12/23/2022, 11:59 AM  [employer].

## 2022-12-23 NOTE — Progress Notes (Signed)
eLink Physician-Brief Progress Note Patient Name: [name] [name] DOB: April 02, 1956 MRN: [id number]   Date of Service  12/23/2022  HPI/Events of Note  BNP 17.4, Cr 3.32  eICU Interventions  LR 500 ml iv fluid bolus ordered.        [name] [name] [name] 12/23/2022, 1:45 AM

## 2022-12-23 NOTE — Progress Notes (Signed)
ANTICOAGULATION CONSULT NOTE - Follow Up  Pharmacy Consult for heparin Indication: SMA stenosis/occlusion  Allergies  Allergen Reactions   Zestril [Lisinopril] Cough    Patient Measurements: Height: 5\' 11"  (180.3 cm) Weight: 96.7 kg (213 lb 3 oz) IBW/kg (Calculated) : 75.3 Heparin Dosing Weight: 94.6kg  Vital Signs: Temp: 97.2 F (36.2 C) (09/14 1000) BP: 156/76 (09/14 1000) Pulse Rate: 107 (09/14 1000)  Labs: Recent Labs    12/21/22 2228 12/21/22 2229 12/22/22 0458 12/22/22 0501 12/22/22 1014 12/23/22 0516  HGB 10.5*   < > 10.9* 11.2*  --  9.6*  HCT 33.1*   < > 34.5* 33.0*  --  30.1*  PLT 209  --  223  --   --  187  HEPARINUNFRC  --   --  0.39  --  0.22* 0.13*  CREATININE 2.90*  --  3.32*  --   --  4.99*   < > = values in this interval not displayed.    Estimated Creatinine Clearance: 16.8 mL/min (A) (by C-G formula based on SCr of 4.99 mg/dL (H)).   Assessment: 22 YOM presenting with abdominal pain found to have mesenteric ischemia. Pharmacy consulted for IV heparin. No AC PTA. Pt s/p ex lap, bowel resection, thrombectomy, and angioplasty 9/12, heparin resumed postop.  Abdomen remains open - no bleeding issues. Heparin was restarted intra-op per surgery at previous rate of 1250 units/hr. Hgb 9.6, plt 187. Given open abdomen will utilize lower therapeutic range for now.  Goal of Therapy:  Heparin level 0.3-0.5 units/ml Monitor platelets by anticoagulation protocol: Yes   Plan:  Continue heparin infusion at 2150 units/hr - check level in 8 hr given subtherapeutic this morning prior to rate increase and stop for OR Daily heparin level and CBC, s/sx of bleeding  Thank you for allowing pharmacy to participate in this patient's care,  Sherron Monday, PharmD, BCCCP Clinical Pharmacist  Phone: [phone] 12/23/2022 11:29 AM  Please check AMION for all [facility] Pharmacy phone numbers After 10:00 PM, call Main Pharmacy [phone]

## 2022-12-23 NOTE — Progress Notes (Signed)
Nutrition Follow-up  DOCUMENTATION CODES:   Severe malnutrition in context of acute illness/injury  INTERVENTION:  Plan is to initiate TPN today per Pharmacy. MD plans to place central line this afternoon prior to initiation to TPN.  Measure daily weights on TPN.   NUTRITION DIAGNOSIS:   Severe Malnutrition related to acute illness as evidenced by energy intake < or equal to 50% for > or equal to 5 days, percent weight loss.  Ongoing - addressing with initiation of TPN.  GOAL:   Patient will meet greater than or equal to 90% of their needs  Progressing with initiation of TPN.  MONITOR:   Vent status, Labs, Weight trends  REASON FOR ASSESSMENT:   Ventilator, Consult New TPN/TNA  ASSESSMENT:   67 yo male admitted with SMA stenosis/thrombosis and SB ischemia requiring SMA thrombectomy and stent followed by ex lap and SB resection. Pt with poor po intake and weight loss for several weeks PTA. 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 Returned to OR this AM for reexploration that found 70-80 cm small bowel marginal although not frankly necrotic. Good Doppler flow in proximal SMA. Plan for reexploration Monday per MD note.  Admission wt: 95.7 kg. Current wt 96.7 kg. Recommend measuring daily weights on TPN.  Patient is currently intubated on ventilator support MV: 8.6 L/min Temp (24hrs), Avg:98.5 F (36.9 C), Min:96.8 F (36 C), Max:99.1 F (37.3 C)  Propofol: 14.79 ml/hr (390 kcal  dailly)  Medications reviewed and include: Novolog 0-9 units Q4hrs, pantoprazole, Cleviprex at 4 mL/hr (192 kcal), fentanyl gtt, heparin, LR at 125 mL/hour, Zosyn, propofol gtt  Labs reviewed: CBG 114-115, BUN 46, Creatinine 4.99. Potassium WNL, Phosphorus 5.3 H, Magnesium WNL  IV Access: 4 x PIVs Per discussion with pharmacy via secure chat plan is for MD to place central access this afternoon prior to starting TPN.  UOP: 218 mL UOP in previous 24 hrs  NG output: 450 mL in previous 24 hrs  Negative pressure wound therapy output: 775 mL in previous 24 hrs  I/O: +9356 mL since admission  Diet Order:   Diet Order             Diet NPO time specified  Diet effective now                  EDUCATION NEEDS:   Not appropriate for education at this time  Skin:  Skin Assessment: Skin Integrity Issues: Skin Integrity Issues:: Wound VAC Wound Vac: open abdomen  Last BM:  12/17/22  Height:   Ht Readings from Last 1 Encounters:  12/20/22 5\' 11"  (1.803 m)   Weight:   Wt Readings from Last 1 Encounters:  12/22/22 96.7 kg   BMI:  Body mass index is 29.73 kg/m.  Estimated Nutritional Needs:   Kcal:  1900-2100 kacls  Protein:  120-140 g  Fluid:  1.7 L  Assad Harbeson Tollie Eth, MS, RD, LDN, CNSC Pager number available on Amion

## 2022-12-23 NOTE — Op Note (Addendum)
Date: December 23, 2022  Preoperative diagnosis: Status post exploratory laparotomy with small bowel resection with acute mesenteric ischemia  Postoperative diagnosis: Same  Procedure: 1.  Reopening of recent laparotomy 2.  Evaluation of SMA with Doppler including mesentery of small bowel  Surgeon: Dr. Cephus Shelling, MD and Dr. Almond Lint, MD  Indications: 67 year old male that underwent exploratory laparotomy with small bowel resection as well as SMA stenting for acute mesenteric ischemia with open abdomen.  He presents back with general surgery for reopening of his his laparotomy.  Findings: There was about 70 to 80 cm of small bowel that appeared marginal although not frankly necrotic.  I scrubbed in and evaluated the SMA proximally and it had a good pulse.  I listened with a pencil Doppler and there was excellent Doppler flow in the proximal SMA.  The area of small bowel in question does not have any Doppler flow in the mesentery.    Anesthesia: General  Details: Patient was taken to the operating room directly from the ICU by general surgery.  I went into the OR after the ABThera had been removed and Dr. Donell Beers had explored the peritoneal contents.  On closer inspection there was about 70-80 cm small bowel that appeared marginal although not frankly necrotic.  I evaluated the SMA proximally after eviscerating the small bowel.  There was a palpable pulse in the SMA proximally.  I used a pencil Doppler and there was excellent Doppler flow.  We evaluated the mesentery for the small bowel in question and there was no Doppler flow here.  Ultimately elected to leave him in discontinuity.  Bowel was not frankly necrotic to require bowel resection at this time.  Plan another exploration on Monday.  Complications: None  Condition: Critical  Cephus Shelling, MD [facility] Office: [phone]   Cephus Shelling

## 2022-12-23 NOTE — Op Note (Addendum)
PRE-OPERATIVE DIAGNOSIS: mesenteric ischemia, bowel discontinuity, open abdomen  POST-OPERATIVE DIAGNOSIS:  Same  PROCEDURE:  Procedure(s): Reexploratory laparotomy, placement of abthera device  SURGEON:  Surgeon(s): Almond Lint, MD  ANESTHESIA:   general  DRAINS:  abthera wound vac    LOCAL MEDICATIONS USED:  NONE  SPECIMEN:  No Specimen  DISPOSITION OF SPECIMEN:  N/A  COUNTS:  YES  DICTATION: .Dragon Dictation  PLAN OF CARE: Admit to inpatient   PATIENT DISPOSITION:  ICU - intubated and hemodynamically stable.  FINDINGS: No frank areas of bowel necrosis.  Distal third of remaining small bowel with dusky appearance and significantly diminished to absent mesenteric doppler signal.  Proximal bowel nice and pink with no areas of necrosis.  Central third with not quite as robust appearance, but not dusky.  Mesenteric artery at the root with very strong triphasic Doppler signal.  EBL: 25 mL  PROCEDURE:   Patient is a in the ICU and was taken to the operating room directly where he was placed supine on the operating room table.  General anesthesia was induced.  The outer layer of the ABThera device was removed and the abdomen was prepped and draped in sterile fashion.  A timeout was performed according to the surgical safety checklist.  When all was correct, we continued.  The inner layer of the ABThera was then removed.  The small bowel was gently eviscerated.  The small bowel was run in one direction.  It became apparent we were going distally.  There was a small hematoma between 2 loops of bowel.  This was evacuated.  The bowel in this location did not appear to have any serosal defects.  The terminal ileum was identified.  The cecum was quite low in the pelvis.  The small bowel was then run proximally.  The previous anastomosis was identified and was in good condition.  The area of discontinuity was identified.  Both ends of this bowel were also in good condition.  The small  bowel is reexamined and the distal portion looked a bit dusky.  Was placed back into the abdomen to avoid tension on the mesentery.  At this point Dr. Chestine Spore scrubbed in to evaluate the mesenteric signal.  The proximal mesentery had a very good triphasic Doppler signal.  The proximal small bowel had good Doppler signals out in the periphery of the mesentery.  The distal small bowel continued to look dusky and did not pink up.  There was approximately 70 cm of potentially compromised bowel distally.  This did not go all the way to the cecum, but there were only about 10 cm of reasonable bowel at the terminal ileum.  There were no areas of necrosis seen.  There was not good Doppler signal in this mesentery.  In the distal mesentery there was no Doppler signal.  The more proximal arteries in the distal mesentery had very weak Doppler signal.  Given the fact that this distal small bowel did not pink up and in fact looks slightly worse toward the end of the case, I elected not to place the bowel back in continuity or closed the fascia.  The abdomen was irrigated and the small bowel gently placed back into the abdomen from distal to proximal.  The omentum was pulled over the top.  Heparin was restarted intraoperatively.  A new ABThera drape was obtained and cut to fit.  This was placed into the abdomen.  Blue VAC sponge was then placed over the top to make this even  with the skin.  The central portion of both blue sponges was used.  The plastic drape was then placed over the top and a central portion trimmed for the track pad.  This was placed back to suction and there were no leaks identified.  The patient was taken back to his room intubated.  Needle, sponge, and instrument counts were correct.

## 2022-12-23 NOTE — Procedures (Signed)
Central Venous Catheter Insertion Procedure Note  [name] [name]  [id number]  July 01, 1955  Date:12/23/22  Time:1:58 PM   Provider Performing:Eniya Cannady Salena Saner Katrinka Blazing   Procedure: Insertion of Non-tunneled Central Venous Catheter(36556)with US guidance (44010)    Indication(s) Hemodialysis  Consent Risks of the procedure as well as the alternatives and risks of each were explained to the patient and/or caregiver.  Consent for the procedure was obtained and is signed in the bedside chart  Anesthesia Topical only with 1% lidocaine   Timeout Verified patient identification, verified procedure, site/side was marked, verified correct patient position, special equipment/implants available, medications/allergies/relevant history reviewed, required imaging and test results available.  Sterile Technique Maximal sterile technique including full sterile barrier drape, hand hygiene, sterile gown, sterile gloves, mask, hair covering, sterile ultrasound probe cover (if used).  Procedure Description Area of catheter insertion was cleaned with chlorhexidine and draped in sterile fashion.   With real-time ultrasound guidance a HD catheter was placed into the right internal jugular vein.  Nonpulsatile blood flow and easy flushing noted in all ports.  The catheter was sutured in place and sterile dressing applied.  Complications/Tolerance None; patient tolerated the procedure well. Chest X-ray is ordered to verify placement for internal jugular or subclavian cannulation.  Chest x-ray is not ordered for femoral cannulation.  EBL Minimal  Specimen(s) None

## 2022-12-23 NOTE — Progress Notes (Signed)
NAME:  [name] [name], MRN:  [id number], DOB:  12-18-1955, LOS: 3 ADMISSION DATE:  12/20/2022, CONSULTATION DATE: 9/12 REFERRING MD: Dr. Jacqulyn Bath, CHIEF COMPLAINT: Mesenteric ischemia  History of Present Illness:  Patient is encephalopathic and/or intubated; therefore, history has been obtained from chart review.  67 year old male with past medical history as below, which is significant for intestinal stromal tumor removal in 2022, known mesenteric stenosis, GERD, hypertension, and hyperlipidemia.  In the few weeks prior to presentation on 9/11 he had been evaluated at several campuses for abdominal pain with noncontrasted CTs of the abdomen which were unrevealing.  He continued to have abdominal pain, weight loss, and poor p.o. intake.  He presented [name] on 9/11 and was admitted to the hospitalist for abdominal pain with concern for superior mesenteric artery thrombus.  He was started on heparin infusion.  Vascular surgery was consulted and ultimately a CT angiogram of the abdomen and pelvis was done 9/12 demonstrating acute occlusion of the distal SMA as well as occlusion of the inferior mesenteric artery.  He was taken to the operating room emergently and and was found to have an necrotic area of the jejunum.  This was resected, and SMA stent was placed, and the patient was left with an open abdomen.  He also remained on the mechanical ventilator postoperatively and PCCM was asked to admit for ventilator management.  Pertinent  Medical History   has a past medical history of Ankle fracture, Cancer (HCC) (1974), Cataract, GERD (gastroesophageal reflux disease), colonic polyps, Hyperlipidemia, Hypertension, Kidney stones, Patella fracture, Retinal detachment, Retinal tear of right eye, and Syncopal episodes (2008).   Significant Hospital Events: Including procedures, antibiotic start and stop dates in addition to other pertinent events   9/11 admitted with abdominal pain 9/12 CT demonstrating  mesenteric occlusion, bowel resection and stent placed, to ICU on vent.  Interim History / Subjective:  [place] this am, no further obvious ischemia seen although still looked threatened, left open in discontinuity More hypertensive today.  Objective   Blood pressure (!) 90/53, pulse 64, temperature 98.2 F (36.8 C), resp. rate 15, height 5\' 11"  (1.803 m), weight 96.7 kg, SpO2 100%.    Vent Mode: PRVC FiO2 (%):  [40 %] 40 % Set Rate:  [15 bmp] 15 bmp Vt Set:  [600 mL] 600 mL PEEP:  [5 cmH20] 5 cmH20 Plateau Pressure:  [16 cmH20-19 cmH20] 19 cmH20   Intake/Output Summary (Last 24 hours) at 12/23/2022 0948 Last data filed at 12/23/2022 0845 Gross per 24 hour  Intake 6392.92 ml  Output 1388 ml  Net 5004.92 ml   Filed Weights   12/20/22 1320 12/21/22 0458 12/22/22 0615  Weight: 95.7 kg 98.6 kg 96.7 kg    Examination: Heavily sedated Pupils pinpoint equal but will wake up Wound vac in place minimal serosanguinous output Abd sounds absent, Ext warm Lungs sound clear Groin access site looks fine Oliguric  K okay, renal function worse Now up 9L CBC benign  Resolved Hospital Problem list     Assessment & Plan:   Acute mesenteric ischemia: Status post small bowel resection and superior mesenteric artery stent placement 9/12.  Open abdomen, discontinuity.  Relook 9/14 still looked not quite pink enough to consider closure. Postoperative ventilator management- no issues.  Leaving on until surgical plans are complete. AKI on CKD3: in context of mesenteric ischemia, fluid shifts and likely ATN.  Heading toward needing RRT if no changes over next day or two. Postop anemia- expected in context of  blood losses from surgery and bone marrow stunning in context of critical illness Protein calorie malnutrition : significant weight loss prior to this admission, now NPO Hx HTN, cataracts, GERD, HLD - vent support, relook Monday with potential closure - zosyn for now for translocation  until we have assured source control - heparin gtt - Avoid acidemia, hypothermia - Nephro consult, probably will need RRT in next day or two - Start TPN given significant recent weight loss, likely prolonged time period before gut can be used - Guarded prognosis  Best Practice (right click and "Reselect all SmartList Selections" daily)   Diet/type: TPN DVT prophylaxis: heparin gtt GI prophylaxis: PPI Lines: Arterial Line Foley:  Yes, and it is still needed Code Status:  full code Last date of multidisciplinary goals of care discussion [updated wife at bedside]  32 min cc time independent of procedures Myrla Halsted MD PCCM  See Amion for personal pager PCCM on call pager [phone] until 7pm. Please call Elink 7p-7a. [phone]  12/23/2022 9:48 AM

## 2022-12-23 NOTE — Transfer of Care (Addendum)
Immediate Anesthesia Transfer of Care Note  Patient: [name] [name]  Procedure(s) Performed: RE-EXPLORATION LAPAROTOMY ABTHERA VAC EXCHANGE (Abdomen)  Patient Location: PACU and ICU  Anesthesia Type:General  Level of Consciousness: Patient remains intubated per anesthesia plan  Airway & Oxygen Therapy: Patient remains intubated per anesthesia plan and Patient placed on Ventilator (see vital sign flow sheet for setting)  Post-op Assessment: Report given to RN and Post -op Vital signs reviewed and stable  Post vital signs: Reviewed and stable  Last Vitals:  Vitals Value Taken Time  BP 160/71 12/23/22 0921  Temp 36 C 12/23/22 0926  Pulse 108 12/23/22 0926  Resp 16 12/23/22 0926  SpO2 99 % 12/23/22 0926  Vitals shown include unfiled device data.  Last Pain:  Vitals:   12/22/22 0800  TempSrc: Esophageal  PainSc:       Patients Stated Pain Goal: 0 (12/21/22 1610)  Complications: No notable events documented.

## 2022-12-23 NOTE — Anesthesia Preprocedure Evaluation (Signed)
Anesthesia Evaluation  Patient identified by MRN, date of birth, ID band Patient unresponsive    Reviewed: Allergy & Precautions, NPO status , Patient's Chart, lab work & pertinent test results, reviewed documented beta blocker date and time , Unable to perform ROS - Chart review only  History of Anesthesia Complications Negative for: history of anesthetic complications  Airway       Comment: intubated Dental   Pulmonary neg pulmonary ROS    + decreased breath sounds  + intubated    Cardiovascular hypertension, + Peripheral Vascular Disease  (-) CAD, (-) Past MI, (-) Cardiac Stents and (-) CABG (-) pacemaker(-) Cardiac Defibrillator  Rhythm:Regular Rate:Tachycardia     Neuro/Psych negative neurological ROS     GI/Hepatic ,GERD  ,,  Endo/Other    Renal/GU ARF and CRFRenal disease     Musculoskeletal   Abdominal   Peds  Hematology  (+) Blood dyscrasia, anemia   Anesthesia Other Findings   Reproductive/Obstetrics                             Anesthesia Physical Anesthesia Plan  ASA: 3  Anesthesia Plan: General   Post-op Pain Management:    Induction: Intravenous  PONV Risk Score and Plan: 2 and Ondansetron  Airway Management Planned: Oral ETT  Additional Equipment:   Intra-op Plan:   Post-operative Plan: Post-operative intubation/ventilation  Informed Consent: I have reviewed the patients History and Physical, chart, labs and discussed the procedure including the risks, benefits and alternatives for the proposed anesthesia with the patient or authorized representative who has indicated his/her understanding and acceptance.     History available from chart only  Plan Discussed with: CRNA  Anesthesia Plan Comments:        Anesthesia Quick Evaluation

## 2022-12-23 NOTE — Progress Notes (Signed)
Pharmacy Antibiotic Note  [name] [name] is a 67 y.o. male admitted on 12/20/2022 with open abdomen in discontinuity.  Pharmacy has been consulted for zosyn dosing. Patient underwent SMA stent and found to have necrotic area of the jejunum.   WBC improved to 7.6, afebrile. Scr continues to increase now to 4.99 (CrCl 16 mL/min). Went back to OR today for evaluation of SMA (left in discontinuity but small bowel noted not to be frankly necrotic) with plan for exploration on Monday.   Plan: Zosyn 2.25 gm IV every 8 hours - if plan for CRRT will adjust to 3.375 g IV every 6 hours Monitor renal function Follow up LOT, closing of abdomen, signs of clinical improvement  Height: 5\' 11"  (180.3 cm) Weight: 96.7 kg (213 lb 3 oz) IBW/kg (Calculated) : 75.3  Temp (24hrs), Avg:98.5 F (36.9 C), Min:96.8 F (36 C), Max:99.1 F (37.3 C)  Recent Labs  Lab 12/20/22 2318 12/21/22 0803 12/21/22 1452 12/21/22 2228 12/22/22 0458 12/23/22 0516  WBC 13.6* 14.1*  --  16.6* 17.4* 7.6  CREATININE 2.90*  --  2.89* 2.90* 3.32* 4.99*    Estimated Creatinine Clearance: 16.8 mL/min (A) (by C-G formula based on SCr of 4.99 mg/dL (H)).    Allergies  Allergen Reactions   Zestril [Lisinopril] Cough    Antimicrobials this admission: Zosyn 9/13 >>   Thank you for allowing pharmacy to participate in this patient's care,  Sherron Monday, PharmD, BCCCP Clinical Pharmacist  Phone: [phone] 12/23/2022 11:43 AM  Please check AMION for all [facility] Pharmacy phone numbers After 10:00 PM, call Main Pharmacy [phone]

## 2022-12-23 NOTE — Anesthesia Postprocedure Evaluation (Signed)
Anesthesia Post Note  Patient: [name] [name]  Procedure(s) Performed: RE-EXPLORATION LAPAROTOMY ABTHERA VAC EXCHANGE (Abdomen)     Patient location during evaluation: SICU Anesthesia Type: General Level of consciousness: sedated Pain management: pain level controlled Vital Signs Assessment: post-procedure vital signs reviewed and stable Respiratory status: patient remains intubated per anesthesia plan Cardiovascular status: stable Postop Assessment: no apparent nausea or vomiting Anesthetic complications: no   No notable events documented.  Last Vitals:  Vitals:   12/23/22 1049 12/23/22 1443  BP:    Pulse: (!) 107 (!) 118  Resp: 18 20  Temp:    SpO2: 98% 95%    Last Pain:  Vitals:   12/22/22 0800  TempSrc: Esophageal  PainSc:                  Mariann Barter

## 2022-12-24 ENCOUNTER — Encounter (HOSPITAL_COMMUNITY): Payer: Self-pay | Admitting: General Surgery

## 2022-12-24 DIAGNOSIS — K551 Chronic vascular disorders of intestine: Secondary | ICD-10-CM | POA: Diagnosis not present

## 2022-12-24 LAB — CBC
HCT: 28.9 % — ABNORMAL LOW (ref 39.0–52.0)
Hemoglobin: 9.2 g/dL — ABNORMAL LOW (ref 13.0–17.0)
MCH: 25.3 pg — ABNORMAL LOW (ref 26.0–34.0)
MCHC: 31.8 g/dL (ref 30.0–36.0)
MCV: 79.6 fL — ABNORMAL LOW (ref 80.0–100.0)
Platelets: 176 10*3/uL (ref 150–400)
RBC: 3.63 MIL/uL — ABNORMAL LOW (ref 4.22–5.81)
RDW: 14.4 % (ref 11.5–15.5)
WBC: 7.6 10*3/uL (ref 4.0–10.5)
nRBC: 0 % (ref 0.0–0.2)

## 2022-12-24 LAB — COMPREHENSIVE METABOLIC PANEL
ALT: 29 U/L (ref 0–44)
AST: 72 U/L — ABNORMAL HIGH (ref 15–41)
Albumin: 1.8 g/dL — ABNORMAL LOW (ref 3.5–5.0)
Alkaline Phosphatase: 38 U/L (ref 38–126)
Anion gap: 15 (ref 5–15)
BUN: 66 mg/dL — ABNORMAL HIGH (ref 8–23)
CO2: 21 mmol/L — ABNORMAL LOW (ref 22–32)
Calcium: 7.8 mg/dL — ABNORMAL LOW (ref 8.9–10.3)
Chloride: 100 mmol/L (ref 98–111)
Creatinine, Ser: 6.87 mg/dL — ABNORMAL HIGH (ref 0.61–1.24)
GFR, Estimated: 8 mL/min — ABNORMAL LOW (ref >60)
Glucose, Bld: 127 mg/dL — ABNORMAL HIGH (ref 70–99)
Potassium: 3.6 mmol/L (ref 3.5–5.1)
Sodium: 136 mmol/L (ref 135–145)
Total Bilirubin: 0.6 mg/dL (ref 0.3–1.2)
Total Protein: 4.6 g/dL — ABNORMAL LOW (ref 6.5–8.1)

## 2022-12-24 LAB — BASIC METABOLIC PANEL
Anion gap: 13 (ref 5–15)
BUN: 57 mg/dL — ABNORMAL HIGH (ref 8–23)
CO2: 21 mmol/L — ABNORMAL LOW (ref 22–32)
Calcium: 7.6 mg/dL — ABNORMAL LOW (ref 8.9–10.3)
Chloride: 102 mmol/L (ref 98–111)
Creatinine, Ser: 6.02 mg/dL — ABNORMAL HIGH (ref 0.61–1.24)
GFR, Estimated: 10 mL/min — ABNORMAL LOW (ref >60)
Glucose, Bld: 126 mg/dL — ABNORMAL HIGH (ref 70–99)
Potassium: 3.7 mmol/L (ref 3.5–5.1)
Sodium: 136 mmol/L (ref 135–145)

## 2022-12-24 LAB — GLUCOSE, CAPILLARY
Glucose-Capillary: 118 mg/dL — ABNORMAL HIGH (ref 70–99)
Glucose-Capillary: 131 mg/dL — ABNORMAL HIGH (ref 70–99)
Glucose-Capillary: 106 mg/dL — ABNORMAL HIGH (ref 70–99)
Glucose-Capillary: 107 mg/dL — ABNORMAL HIGH (ref 70–99)
Glucose-Capillary: 123 mg/dL — ABNORMAL HIGH (ref 70–99)

## 2022-12-24 LAB — HEPARIN LEVEL (UNFRACTIONATED)
Heparin Unfractionated: <0.1 [IU]/mL — ABNORMAL LOW (ref 0.30–0.70)
Heparin Unfractionated: <0.1 [IU]/mL — ABNORMAL LOW (ref 0.30–0.70)

## 2022-12-24 LAB — PHOSPHORUS: Phosphorus: 5.8 mg/dL — ABNORMAL HIGH (ref 2.5–4.6)

## 2022-12-24 LAB — MAGNESIUM: Magnesium: 2 mg/dL (ref 1.7–2.4)

## 2022-12-24 MED ORDER — CALCIUM GLUCONATE-NACL 2-0.675 GM/100ML-% IV SOLN
2.0000 g | Freq: Once | INTRAVENOUS | Status: AC
  Administered 2022-12-24: 2000 mg via INTRAVENOUS
  Filled 2022-12-24: qty 100

## 2022-12-24 MED ORDER — ORAL CARE MOUTH RINSE: 15.0000 mL | OROMUCOSAL | Status: DC | PRN

## 2022-12-24 MED ORDER — TRAVASOL 10 % IV SOLN
INTRAVENOUS | Status: AC
  Filled 2022-12-24: qty 1009.7

## 2022-12-24 MED ORDER — FENTANYL CITRATE PF 50 MCG/ML IJ SOSY: 100.0000 ug | PREFILLED_SYRINGE | Freq: Once | INTRAMUSCULAR | Status: DC

## 2022-12-24 MED ORDER — MIDAZOLAM HCL 2 MG/2ML IJ SOLN
2.0000 mg | Freq: Once | INTRAMUSCULAR | Status: AC
  Administered 2022-12-25: 2 mg via INTRAVENOUS

## 2022-12-24 MED ORDER — ORAL CARE MOUTH RINSE
15.0000 mL | OROMUCOSAL | Status: DC
  Administered 2022-12-24 – 2022-12-29 (×53): 15 mL via OROMUCOSAL

## 2022-12-24 NOTE — Progress Notes (Signed)
[facility]  Subjective  - intubated and sedated in ICU.   Objective (!) 130/59 75 98.8 F (37.1 C) 15 97%  Intake/Output Summary (Last 24 hours) at 12/24/2022 0953 Last data filed at 12/24/2022 0700 Gross per 24 hour  Intake 1796.07 ml  Output 4545 ml  Net -2748.93 ml    Abdomen open with ABThera Brisk right DP and left PT signal  Laboratory Lab Results: Recent Labs    12/23/22 0516 12/24/22 0500  WBC 7.6 7.6  HGB 9.6* 9.2*  HCT 30.1* 28.9*  PLT 187 176   BMET Recent Labs    12/23/22 0516 12/24/22 0500  NA 135 136  K 3.8 3.7  CL 103 102  CO2 22 21*  GLUCOSE 125* 126*  BUN 46* 57*  CREATININE 4.99* 6.02*  CALCIUM 7.5* 7.6*    COAG No results found for: "INR", "PROTIME" No results found for: "PTT"  Assessment/Planning:  67 year old male presented with acute mesenteric ischemia requiring ex lap with small bowel resection and SMA stenting.  On exploration yesterday about 70-80 cm small bowel marginal although not frankly necrotic.  There was good Doppler flow in the proximal SMA.  Plan return to the OR tomorrow with general surgery.  Please continue IV heparin.  General surgery would like to hold Plavix for SMA stent until after reexploration tomorrow.  White blood cell count remains normal at 7.6 which is encouraging.  No pressor requirement.  Urine output improved overnight to 2.5 L after lasix.  Did not require CRRT and nephrology following.     Cephus Shelling 12/24/2022 9:53 AM --

## 2022-12-24 NOTE — Progress Notes (Signed)
ANTICOAGULATION CONSULT NOTE - Follow Up  Pharmacy Consult for heparin Indication: SMA stenosis/occlusion  Allergies  Allergen Reactions   Zestril [Lisinopril] Cough    Patient Measurements: Height: 5\' 11"  (180.3 cm) Weight: 96.7 kg (213 lb 3 oz) IBW/kg (Calculated) : 75.3 Heparin Dosing Weight: 94.6kg  Vital Signs: Temp: 98.8 F (37.1 C) (09/15 0700) BP: 130/59 (09/15 0840) Pulse Rate: 75 (09/15 0700)  Labs: Recent Labs    12/22/22 0458 12/22/22 0501 12/22/22 1014 12/23/22 0516 12/23/22 2315 12/24/22 0500 12/24/22 0806  HGB 10.9* 11.2*  --  9.6*  --  9.2*  --   HCT 34.5* 33.0*  --  30.1*  --  28.9*  --   PLT 223  --   --  187  --  176  --   HEPARINUNFRC 0.39  --    < > 0.13* 0.10*  --  <0.10*  CREATININE 3.32*  --   --  4.99*  --  6.02*  --    < > = values in this interval not displayed.    Estimated Creatinine Clearance: 13.9 mL/min (A) (by C-G formula based on SCr of 6.02 mg/dL (H)).   Assessment: 1 YOM presenting with abdominal pain found to have mesenteric ischemia. Pharmacy consulted for IV heparin. No AC PTA. Pt s/p ex lap, bowel resection, thrombectomy, and angioplasty 9/12, heparin resumed postop.  Abdomen remains open - no bleeding issues. Heparin level this morning came back undetectable, on 2350 units/hr. Hgb 9.2, plt 176. No s/sx of bleeding or infusion issues. Running peripherally, drawn from A line (great blood return on IV where infusing). Given open abdomen will utilize lower therapeutic range for now.  Goal of Therapy:  Heparin level 0.3-0.5 units/ml Monitor platelets by anticoagulation protocol: Yes   Plan:  Increase heparin infusion at 2550 units/hr Check level in 8 hr  Daily heparin level and CBC, s/sx of bleeding  Thank you for allowing pharmacy to participate in this patient's care,  Sherron Monday, PharmD, BCCCP Clinical Pharmacist  Phone: [phone] 12/24/2022 9:52 AM  Please check AMION for all [facility] Pharmacy phone numbers After  10:00 PM, call Main Pharmacy [phone]

## 2022-12-24 NOTE — Progress Notes (Addendum)
[facility] NEPHROLOGY PROGRESS NOTE  Assessment/ Plan: Pt is a 67 y.o. 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   # Acute kidney injury on CKD IIIb: Baseline creatinine level around 2.0.  Acute kidney injury likely ischemic ATN due to ex lap surgery/IV contrast/hypotension with hemodynamic changes. Received a dose of Lasix yesterday with robust response.  Urine output 2.5 L.  Also GI and drain output of around 2 L. Noted BUN/creatinine levels uptrending but no urgent indication to initiate RRT today. No need for diuretics today. We will assess for CRRT versus HD need in AM.  Discussed with the patient's wife on 9/14 for possible dialysis need. Strict ins and out and close lab monitoring. Discussed with the primary team.   # Acute mesenteric ischemia: Status post small bowel resection and superior mesenteric artery stent placement.  Followed by vascular surgeon and currently on heparin.  Likely going to the OR again.   # Vent dependent respiratory failure: Per PCCM.   # Anemia likely due to blood loss: Monitor CBC and transfuse as as per ICU's discretion.   # History of hypertension: Patient required pressors for hypotension in OR.  Currently BP acceptable.  Discussed with ICU provider and nurses.  Subjective: Seen and examined at bedside.  Patient remains intubated and sedated.  Not on pressors.  Urine output is recorded around 2.5 L.  There is also output from NG tube and drains. Objective Vital signs in last 24 hours: Vitals:   12/24/22 0500 12/24/22 0600 12/24/22 0700 12/24/22 0840  BP: (!) 109/58 105/61 (!) 98/59 (!) 130/59  Pulse: 73 76 75   Resp: 15 15 15    Temp: 98.6 F (37 C) 98.8 F (37.1 C) 98.8 F (37.1 C)    TempSrc:      SpO2: 98% 98% 97%   Weight:      Height:       Weight change:   Intake/Output Summary (Last 24 hours) at 12/24/2022 0936 Last data filed at 12/24/2022 0700 Gross per 24 hour  Intake 1796.07 ml  Output 4545 ml  Net -2748.93 ml       Labs: RENAL PANEL Recent Labs  Lab 12/20/22 1441 12/20/22 2318 12/21/22 1452 12/21/22 2228 12/21/22 2229 12/22/22 0458 12/22/22 0501 12/23/22 0516 12/24/22 0500  NA 138 138 137 136 138 135 137 135 136  K 3.8 3.2* 3.3* 3.2* 3.4* 3.8 3.9 3.8 3.7  CL 99 100 101 101  --  103  --  103 102  CO2 29 27 23  21*  --  22  --  22 21*  GLUCOSE 126* 134* 104* 128*  --  141*  --  125* 126*  BUN 36* 37* 37* 34*  --  36*  --  46* 57*  CREATININE 3.00* 2.90* 2.89* 2.90*  --  3.32*  --  4.99* 6.02*  CALCIUM 8.6* 8.6* 8.1* 7.7*  --  8.0*  --  7.5* 7.6*  MG  --  1.6*  --  1.4*  --  2.0  --  1.9 2.0  PHOS  --   --   --  4.2  --   --   --  5.3* 5.8*  ALBUMIN 3.1* 2.8*  --   --   --  2.3*  --   --   --  Liver Function Tests: Recent Labs  Lab 12/20/22 1441 12/20/22 2318 12/22/22 0458  AST 74* 65* 45*  ALT 105* 95* 69*  ALKPHOS 67 61 57  BILITOT 1.2 1.0 1.1  PROT 6.8 5.9* 5.6*  ALBUMIN 3.1* 2.8* 2.3*   No results for input(s): "LIPASE", "AMYLASE" in the last 168 hours. No results for input(s): "AMMONIA" in the last 168 hours. CBC: Recent Labs    12/21/22 2229 12/22/22 0458 12/22/22 0501 12/23/22 0516 12/24/22 0500  HGB 10.5* 10.9* 11.2* 9.6* 9.2*  MCV  --  79.5*  --  80.3 79.6*    Cardiac Enzymes: No results for input(s): "CKTOTAL", "CKMB", "CKMBINDEX", "TROPONINI" in the last 168 hours. CBG: Recent Labs  Lab 12/23/22 1549 12/23/22 2009 12/23/22 2350 12/24/22 0349 12/24/22 0807  GLUCAP 134* 143* 134* 118* 131*    Iron Studies: No results for input(s): "IRON", "TIBC", "TRANSFERRIN", "FERRITIN" in the last 72 hours. Studies/Results: DG Chest Port 1 View  Result Date: 12/23/2022 CLINICAL DATA:  Central line  placement. EXAM: PORTABLE CHEST 1 VIEW COMPARISON:  One-view chest x-ray 12/20/2022 FINDINGS: Heart is enlarged. Scratched at the heart is enlarged. A right IJ sheath terminates in mid SVC. A left subclavian line terminates at the cavoatrial junction. Endotracheal tube terminates 0.4 cm above the carina. Side port of the gastric tube is just past the GE junction. Lung volumes are low. Mild atelectasis is worse left than right. No edema or significant airspace consolidation is present. IMPRESSION: 1. Low lung volumes. 2. Cardiomegaly without failure. 3. Support apparatus as described. 4. No pneumothorax. 5. Mild bibasilar atelectasis. 6. No acute cardiopulmonary disease. Electronically Signed   By: Marin Roberts M.D.   On: 12/23/2022 14:23   PERIPHERAL VASCULAR CATHETERIZATION  Result Date: 12/23/2022 See surgical note for result.   Medications: Infusions:  clevidipine Stopped (12/23/22 1522)   fentaNYL infusion INTRAVENOUS 100 mcg/hr (12/24/22 0700)   heparin 2,350 Units/hr (12/24/22 0700)   piperacillin-tazobactam (ZOSYN)  IV Stopped (12/24/22 0539)   propofol (DIPRIVAN) infusion 30 mcg/kg/min (12/24/22 0700)   TPN ADULT (ION) 35 mL/hr at 12/24/22 0700   TPN ADULT (ION)      Scheduled Medications:  aspirin  300 mg Rectal Daily   Chlorhexidine Gluconate Cloth  6 each Topical Daily   insulin aspart  0-9 Units Subcutaneous Q4H   pantoprazole (PROTONIX) IV  40 mg Intravenous QHS   sodium chloride flush  3 mL Intravenous Q12H    have reviewed scheduled and prn medications.  Physical Exam: General: Intubated and sedated. Heart:RRR, s1s2 nl Lungs: Coarse breath sound  bilateral. Abdomen:soft, Non-tender, non-distended Extremities:No edema Neurology: Sedated  Maxie Barb 12/24/2022,9:36 AM  LOS: 4 days

## 2022-12-24 NOTE — Progress Notes (Signed)
1 Day Post-Op  Subjective: No overnight events.  Reexplored yesterday.  Distal small bowel with faint doppler signal in larger arcade vessels, no signal in distal vessels.  Dusky distal small bowel. No necrosis.     Objective: Vital signs in last 24 hours: Temp:  [97.2 F (36.2 C)-100 F (37.8 C)] 98.8 F (37.1 C) (09/15 0700) Pulse Rate:  [73-122] 75 (09/15 0700) Resp:  [15-24] 15 (09/15 0700) BP: (98-163)/(57-83) 130/59 (09/15 0840) SpO2:  [93 %-98 %] 97 % (09/15 0700) Arterial Line BP: (102-213)/(52-72) 110/54 (09/15 0700) FiO2 (%):  [40 %] 40 % (09/15 0840) Last BM Date : 12/17/22  Vent Mode: PRVC FiO2 (%):  [40 %] 40 % Set Rate:  [15 bmp] 15 bmp Vt Set:  [600 mL] 600 mL PEEP:  [5 cmH20] 5 cmH20 Pressure Support:  [5 cmH20-8 cmH20] 5 cmH20 Plateau Pressure:  [5 cmH20-19 cmH20] 19 cmH20  Intake/Output from previous day: 09/14 0701 - 09/15 0700 In: 2296.1 [I.V.:1646.1; IV Piggyback:650] Out: 4670 [Urine:2595; Emesis/NG output:1300; Drains:750; Blood:25] Intake/Output this shift: No intake/output data recorded.  PE: General: resting in bed, sedated Resp: intubated, on vent CV: RRR Abdomen: abdomen open with abthera, serosanguinous drainage Extremities: warm and well-perfused   Lab Results:  Recent Labs    12/23/22 0516 12/24/22 0500  WBC 7.6 7.6  HGB 9.6* 9.2*  HCT 30.1* 28.9*  PLT 187 176   BMET Recent Labs    12/23/22 0516 12/24/22 0500  NA 135 136  K 3.8 3.7  CL 103 102  CO2 22 21*  GLUCOSE 125* 126*  BUN 46* 57*  CREATININE 4.99* 6.02*  CALCIUM 7.5* 7.6*   PT/INR No results for input(s): "LABPROT", "INR" in the last 72 hours. CMP     Component Value Date/Time   NA 136 12/24/2022 0500   K 3.7 12/24/2022 0500   CL 102 12/24/2022 0500   CO2 21 (L) 12/24/2022 0500   GLUCOSE 126 (H) 12/24/2022 0500   BUN 57 (H) 12/24/2022 0500   CREATININE 6.02 (H) 12/24/2022 0500   CALCIUM 7.6 (L) 12/24/2022 0500   PROT 5.6 (L) 12/22/2022 0458    PROT 6.3 03/16/2020 0800   ALBUMIN 2.3 (L) 12/22/2022 0458   ALBUMIN 4.3 03/16/2020 0800   AST 45 (H) 12/22/2022 0458   ALT 69 (H) 12/22/2022 0458   ALKPHOS 57 12/22/2022 0458   BILITOT 1.1 12/22/2022 0458   BILITOT 0.8 03/16/2020 0800   GFRNONAA 10 (L) 12/24/2022 0500   GFRAA >60 04/14/2015 1127   Lipase     Component Value Date/Time   LIPASE 28 11/27/2022 0755      Assessment/Plan 67 yo male with SMA stenosis/thrombosis and small bowel ischemia. S/p SMA thrombectomy and stent 9/12 Dr. Karin Lieu, exploratory laparotomy and small bowel resection Dr. Janee Morn; takeback replace abthera FB 12/23/22 - Small bowel in discontinuity with open abdomen. Tentatively plan to return to OR tomorrow for re-exploration, possible bowel anastomosis and attempted abdominal closure if no further bowel ischemia. - Strict NPO, no enteral feeds or medications, OG to suction - On antibiotics for bowel ischemia - Heparin gtt per vascula surgery  - General surgery will follow  I advised wife that plan would be OR tomorrow. Writing consent today for Dr. Andrey Campanile tomorrow.    LOS: 4 days   Maudry Diego, MD, FACS, FSSO Surgical Oncology, General Surgery, Trauma and Critical [facility], [place] [phone] for weekday/non holidays Check [email or web address] for General Surgery coverage night/weekend/holidays  12/24/22 9:54 AM

## 2022-12-24 NOTE — Progress Notes (Signed)
PHARMACY - TOTAL PARENTERAL NUTRITION CONSULT NOTE   Indication:  SMA syndrome w associated weight loss  Patient Measurements: Height: 5\' 11"  (180.3 cm) Weight: 96.7 kg (213 lb 3 oz) IBW/kg (Calculated) : 75.3 TPN AdjBW (KG): 80.4 Body mass index is 29.73 kg/m. Usual Weight: 95 kg, has lost 50lbs recently   Assessment:  67 yo M admitted with abdominal pain, weight loss, and poor PO intake. 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 He is now s/p SMA stent placement. He is currently left is discontinuity w open abdomen.  Glucose / Insulin: A1c 5.3. CBG<180. 3u SSI in last 24h  Electrolytes: K 3.7, HC03 21, Mag 2.0, Phos 5.8. CoCa 8.7 -s/p lasix 60 IV x 1 on 9/15.  Renal: Scr 6.02, BUN 57 Hepatic: Alb 2.3, LFT downtrending. Tbili 1.1  Intake / Output; MIVF: 1.1 ml/kg/hr, 1300 mL NGT output, 750 ml from wound vac. +6.9L this admit.  LR@90  GI Imaging: GI Surgeries / Procedures:  9/14 re-ex lap. Left in discontinuity with plans for re-exploration on Monday.  Central access: CVC LIJ 9/14 TPN start date: 12/23/22  Nutritional Goals: Goal TPN rate is 90 mL/hr (provides 130 g of protein and 2092 kcals per day)  RD Assessment: Estimated Needs Total Energy Estimated Needs: 1900-2100 kacls Total Protein Estimated Needs: 120-140 g Total Fluid Estimated Needs: 1.7 L  Current Nutrition:  NPO Propofol at 30 mcg/kg/min (52ml/hr) >>448 kcal/day   Plan:  Advance TPN to 70 mL/hr at 1800 - to provide 80% of estimated needs (plus propofol kcal = 2078 kcal/day, 100% of estimated need for today)  Electrolytes in TPN: Na 28mEq/L, K 15 mEq/L, Ca 8 mEq/L, Mg 5 mEq/L, and Phos 0 mmol/L. Max acetate Calcium gluconate 2g IV x1 outside TPN this AM.  Add standard MVI and trace elements to TPN, no chromium d/t shortage Continue Sensitive q4h SSI and adjust as needed  Stop MIVF  Monitor TPN labs on Mon/Thurs, daily until stable F/u  propofol use  F/u initiation of RRT iHD vs CRRT and nephrology plans.   Calton Dach, PharmD, BCCCP Clinical Pharmacist 12/24/2022 7:23 AM

## 2022-12-24 NOTE — H&P (View-Only) (Signed)
1 Day Post-Op  Subjective: No overnight events.  Reexplored yesterday.  Distal small bowel with faint doppler signal in larger arcade vessels, no signal in distal vessels.  Dusky distal small bowel. No necrosis.     Objective: Vital signs in last 24 hours: Temp:  [97.2 F (36.2 C)-100 F (37.8 C)] 98.8 F (37.1 C) (09/15 0700) Pulse Rate:  [73-122] 75 (09/15 0700) Resp:  [15-24] 15 (09/15 0700) BP: (98-163)/(57-83) 130/59 (09/15 0840) SpO2:  [93 %-98 %] 97 % (09/15 0700) Arterial Line BP: (102-213)/(52-72) 110/54 (09/15 0700) FiO2 (%):  [40 %] 40 % (09/15 0840) Last BM Date : 12/17/22  Vent Mode: PRVC FiO2 (%):  [40 %] 40 % Set Rate:  [15 bmp] 15 bmp Vt Set:  [600 mL] 600 mL PEEP:  [5 cmH20] 5 cmH20 Pressure Support:  [5 cmH20-8 cmH20] 5 cmH20 Plateau Pressure:  [5 cmH20-19 cmH20] 19 cmH20  Intake/Output from previous day: 09/14 0701 - 09/15 0700 In: 2296.1 [I.V.:1646.1; IV Piggyback:650] Out: 4670 [Urine:2595; Emesis/NG output:1300; Drains:750; Blood:25] Intake/Output this shift: No intake/output data recorded.  PE: General: resting in bed, sedated Resp: intubated, on vent CV: RRR Abdomen: abdomen open with abthera, serosanguinous drainage Extremities: warm and well-perfused   Lab Results:  Recent Labs    12/23/22 0516 12/24/22 0500  WBC 7.6 7.6  HGB 9.6* 9.2*  HCT 30.1* 28.9*  PLT 187 176   BMET Recent Labs    12/23/22 0516 12/24/22 0500  NA 135 136  K 3.8 3.7  CL 103 102  CO2 22 21*  GLUCOSE 125* 126*  BUN 46* 57*  CREATININE 4.99* 6.02*  CALCIUM 7.5* 7.6*   PT/INR No results for input(s): "LABPROT", "INR" in the last 72 hours. CMP     Component Value Date/Time   NA 136 12/24/2022 0500   K 3.7 12/24/2022 0500   CL 102 12/24/2022 0500   CO2 21 (L) 12/24/2022 0500   GLUCOSE 126 (H) 12/24/2022 0500   BUN 57 (H) 12/24/2022 0500   CREATININE 6.02 (H) 12/24/2022 0500   CALCIUM 7.6 (L) 12/24/2022 0500   PROT 5.6 (L) 12/22/2022 0458    PROT 6.3 03/16/2020 0800   ALBUMIN 2.3 (L) 12/22/2022 0458   ALBUMIN 4.3 03/16/2020 0800   AST 45 (H) 12/22/2022 0458   ALT 69 (H) 12/22/2022 0458   ALKPHOS 57 12/22/2022 0458   BILITOT 1.1 12/22/2022 0458   BILITOT 0.8 03/16/2020 0800   GFRNONAA 10 (L) 12/24/2022 0500   GFRAA >60 04/14/2015 1127   Lipase     Component Value Date/Time   LIPASE 28 11/27/2022 0755      Assessment/Plan 67 yo male with SMA stenosis/thrombosis and small bowel ischemia. S/p SMA thrombectomy and stent 9/12 Dr. Karin Lieu, exploratory laparotomy and small bowel resection Dr. Janee Morn; takeback replace abthera FB 12/23/22 - Small bowel in discontinuity with open abdomen. Tentatively plan to return to OR tomorrow for re-exploration, possible bowel anastomosis and attempted abdominal closure if no further bowel ischemia. - Strict NPO, no enteral feeds or medications, OG to suction - On antibiotics for bowel ischemia - Heparin gtt per vascula surgery  - General surgery will follow  I advised wife that plan would be OR tomorrow. Writing consent today for Dr. Andrey Campanile tomorrow.    LOS: 4 days   Maudry Diego, MD, FACS, FSSO Surgical Oncology, General Surgery, Trauma and Critical [facility], [place] [phone] for weekday/non holidays Check [email or web address] for General Surgery coverage night/weekend/holidays  12/24/22 9:54 AM

## 2022-12-24 NOTE — Progress Notes (Signed)
NAME:  [name] [name], MRN:  [id number], DOB:  1955/11/09, LOS: 4 ADMISSION DATE:  12/20/2022, CONSULTATION DATE: 9/12 REFERRING MD: Dr. Jacqulyn Bath, CHIEF COMPLAINT: Mesenteric ischemia  History of Present Illness:  Patient is encephalopathic and/or intubated; therefore, history has been obtained from chart review.  67 year old male with past medical history as below, which is significant for intestinal stromal tumor removal in 2022, known mesenteric stenosis, GERD, hypertension, and hyperlipidemia.  In the few weeks prior to presentation on 9/11 he had been evaluated at several campuses for abdominal pain with noncontrasted CTs of the abdomen which were unrevealing.  He continued to have abdominal pain, weight loss, and poor p.o. intake.  He presented [name] on 9/11 and was admitted to the hospitalist for abdominal pain with concern for superior mesenteric artery thrombus.  He was started on heparin infusion.  Vascular surgery was consulted and ultimately a CT angiogram of the abdomen and pelvis was done 9/12 demonstrating acute occlusion of the distal SMA as well as occlusion of the inferior mesenteric artery.  He was taken to the operating room emergently and and was found to have an necrotic area of the jejunum.  This was resected, and SMA stent was placed, and the patient was left with an open abdomen.  He also remained on the mechanical ventilator postoperatively and PCCM was asked to admit for ventilator management.  Pertinent  Medical History   has a past medical history of Ankle fracture, Cancer (HCC) (1974), Cataract, GERD (gastroesophageal reflux disease), colonic polyps, Hyperlipidemia, Hypertension, Kidney stones, Patella fracture, Retinal detachment, Retinal tear of right eye, and Syncopal episodes (2008).   Significant Hospital Events: Including procedures, antibiotic start and stop dates in addition to other pertinent events   9/11 admitted with abdominal pain 9/12 CT demonstrating  mesenteric occlusion, bowel resection and stent placed, to ICU on vent.  Interim History / Subjective:  No events. Did make some urine  Objective   Blood pressure (!) 130/59, pulse 75, temperature 98.8 F (37.1 C), resp. rate 15, height 5\' 11"  (1.803 m), weight 96.7 kg, SpO2 97%. CVP:  [6 mmHg-8 mmHg] 7 mmHg  Vent Mode: PRVC FiO2 (%):  [40 %] 40 % Set Rate:  [15 bmp] 15 bmp Vt Set:  [600 mL] 600 mL PEEP:  [5 cmH20] 5 cmH20 Pressure Support:  [5 cmH20-8 cmH20] 5 cmH20 Plateau Pressure:  [5 cmH20-19 cmH20] 19 cmH20   Intake/Output Summary (Last 24 hours) at 12/24/2022 0918 Last data filed at 12/24/2022 0700 Gross per 24 hour  Intake 1796.07 ml  Output 4545 ml  Net -2748.93 ml   Filed Weights   12/20/22 1320 12/21/22 0458 12/22/22 0615  Weight: 95.7 kg 98.6 kg 96.7 kg    Examination: Sedated, pupils pinpoint Abd vac in place Lungs pretty clear, vent settings benign Ext warm Was moving purposefully yesterday prior to increased sedation  Cr continues to climb but did make some urine CVP 9 CBC ok  Resolved Hospital Problem list     Assessment & Plan:   Acute mesenteric ischemia: Status post small bowel resection and superior mesenteric artery stent placement 9/12.  Open abdomen, discontinuity.  Relook 9/14 still looked not quite pink enough to consider closure (see op note). Postoperative ventilator management- no issues.  Leaving on until surgical plans are complete. AKI on CKD3: in context of mesenteric ischemia, fluid shifts and likely ATN.  Heading toward needing RRT if no changes over next day or two.  Appreciate nephro input. Postop anemia- expected  in context of blood losses from surgery and bone marrow stunning in context of critical illness Protein calorie malnutrition : significant weight loss prior to this admission, now NPO Hx HTN, cataracts, GERD, HLD  - vent support, relook ex lap Monday with potential closure - zosyn for now for translocation until we have  assured source control - heparin gtt for mesenteric stent, appreciate PharmD help with dosing - Avoid acidemia, hypothermia - Continue TPN - Guarded prognosis, will depend on if enough bowel remains viable  Best Practice (right click and "Reselect all SmartList Selections" daily)   Diet/type: TPN DVT prophylaxis: heparin gtt GI prophylaxis: PPI Lines: Arterial Line Foley:  Yes, and it is still needed Code Status:  full code Last date of multidisciplinary goals of care discussion [updated wife at bedside 9/14]  37 min cc time independent of procedures Myrla Halsted MD PCCM  See Amion for personal pager PCCM on call pager [phone] until 7pm. Please call Elink 7p-7a. [phone]  12/24/2022 9:18 AM

## 2022-12-25 ENCOUNTER — Inpatient Hospital Stay (HOSPITAL_COMMUNITY): Payer: Medicare Other | Admitting: Certified Registered"

## 2022-12-25 ENCOUNTER — Encounter (HOSPITAL_COMMUNITY)
Admission: EM | Disposition: A | Discharge status: Rehabilitation Facility | Payer: Self-pay | Source: Home / Self Care | Attending: Internal Medicine

## 2022-12-25 ENCOUNTER — Other Ambulatory Visit: Payer: Self-pay

## 2022-12-25 DIAGNOSIS — E43 Unspecified severe protein-calorie malnutrition: Secondary | ICD-10-CM | POA: Insufficient documentation

## 2022-12-25 DIAGNOSIS — S31109A Unspecified open wound of abdominal wall, unspecified quadrant without penetration into peritoneal cavity, initial encounter: Secondary | ICD-10-CM

## 2022-12-25 DIAGNOSIS — N1831 Chronic kidney disease, stage 3a: Secondary | ICD-10-CM | POA: Diagnosis not present

## 2022-12-25 DIAGNOSIS — I129 Hypertensive chronic kidney disease with stage 1 through stage 4 chronic kidney disease, or unspecified chronic kidney disease: Secondary | ICD-10-CM

## 2022-12-25 DIAGNOSIS — K551 Chronic vascular disorders of intestine: Secondary | ICD-10-CM | POA: Diagnosis not present

## 2022-12-25 DIAGNOSIS — E785 Hyperlipidemia, unspecified: Secondary | ICD-10-CM | POA: Diagnosis not present

## 2022-12-25 HISTORY — PX: BOWEL RESECTION: SHX1257

## 2022-12-25 HISTORY — PX: LAPAROTOMY: SHX154

## 2022-12-25 LAB — TYPE AND SCREEN
ABO/RH(D): A POS
ABO/RH(D): A POS
Antibody Screen: NEGATIVE
Antibody Screen: NEGATIVE
Unit division: 0
Unit division: 0
Unit division: 0
Unit division: 0

## 2022-12-25 LAB — GLUCOSE, CAPILLARY
Glucose-Capillary: 102 mg/dL — ABNORMAL HIGH (ref 70–99)
Glucose-Capillary: 105 mg/dL — ABNORMAL HIGH (ref 70–99)
Glucose-Capillary: 130 mg/dL — ABNORMAL HIGH (ref 70–99)
Glucose-Capillary: 133 mg/dL — ABNORMAL HIGH (ref 70–99)
Glucose-Capillary: 160 mg/dL — ABNORMAL HIGH (ref 70–99)
Glucose-Capillary: 97 mg/dL (ref 70–99)

## 2022-12-25 LAB — CBC
HCT: 29 % — ABNORMAL LOW (ref 39.0–52.0)
HCT: 34.9 % — ABNORMAL LOW (ref 39.0–52.0)
Hemoglobin: 9.4 g/dL — ABNORMAL LOW (ref 13.0–17.0)
Hemoglobin: 10.5 g/dL — ABNORMAL LOW (ref 13.0–17.0)
MCH: 26 pg (ref 26.0–34.0)
MCH: 24.5 pg — ABNORMAL LOW (ref 26.0–34.0)
MCHC: 32.4 g/dL (ref 30.0–36.0)
MCHC: 30.1 g/dL (ref 30.0–36.0)
MCV: 80.3 fL (ref 80.0–100.0)
MCV: 81.5 fL (ref 80.0–100.0)
Platelets: 152 10*3/uL (ref 150–400)
Platelets: 192 10*3/uL (ref 150–400)
RBC: 3.61 MIL/uL — ABNORMAL LOW (ref 4.22–5.81)
RBC: 4.28 MIL/uL (ref 4.22–5.81)
RDW: 14.4 % (ref 11.5–15.5)
RDW: 14.6 % (ref 11.5–15.5)
WBC: 6.7 10*3/uL (ref 4.0–10.5)
WBC: 12 10*3/uL — ABNORMAL HIGH (ref 4.0–10.5)
nRBC: 0 % (ref 0.0–0.2)
nRBC: 0 % (ref 0.0–0.2)

## 2022-12-25 LAB — BPAM RBC
Blood Product Expiration Date: 202410042359
Blood Product Expiration Date: 202410042359
Blood Product Expiration Date: 202410042359
Blood Product Expiration Date: 202410092359
ISSUE DATE / TIME: 202409111635
ISSUE DATE / TIME: 202409122117
ISSUE DATE / TIME: 202409122117
Unit Type and Rh: 6200
Unit Type and Rh: 6200
Unit Type and Rh: 6200
Unit Type and Rh: 6200

## 2022-12-25 LAB — COMPREHENSIVE METABOLIC PANEL
ALT: 32 U/L (ref 0–44)
AST: 79 U/L — ABNORMAL HIGH (ref 15–41)
Albumin: 1.8 g/dL — ABNORMAL LOW (ref 3.5–5.0)
Alkaline Phosphatase: 45 U/L (ref 38–126)
Anion gap: 10 (ref 5–15)
BUN: 72 mg/dL — ABNORMAL HIGH (ref 8–23)
CO2: 22 mmol/L (ref 22–32)
Calcium: 7.9 mg/dL — ABNORMAL LOW (ref 8.9–10.3)
Chloride: 105 mmol/L (ref 98–111)
Creatinine, Ser: 6.97 mg/dL — ABNORMAL HIGH (ref 0.61–1.24)
GFR, Estimated: 8 mL/min — ABNORMAL LOW (ref >60)
Glucose, Bld: 123 mg/dL — ABNORMAL HIGH (ref 70–99)
Potassium: 3.7 mmol/L (ref 3.5–5.1)
Sodium: 137 mmol/L (ref 135–145)
Total Bilirubin: 0.7 mg/dL (ref 0.3–1.2)
Total Protein: 5 g/dL — ABNORMAL LOW (ref 6.5–8.1)

## 2022-12-25 LAB — MAGNESIUM: Magnesium: 2.2 mg/dL (ref 1.7–2.4)

## 2022-12-25 LAB — SURGICAL PATHOLOGY

## 2022-12-25 LAB — HEPARIN LEVEL (UNFRACTIONATED)
Heparin Unfractionated: <0.1 [IU]/mL — ABNORMAL LOW (ref 0.30–0.70)
Heparin Unfractionated: <0.1 [IU]/mL — ABNORMAL LOW (ref 0.30–0.70)

## 2022-12-25 LAB — TRIGLYCERIDES: Triglycerides: 83 mg/dL (ref <150)

## 2022-12-25 LAB — PHOSPHORUS: Phosphorus: 6 mg/dL — ABNORMAL HIGH (ref 2.5–4.6)

## 2022-12-25 SURGERY — LAPAROTOMY, EXPLORATORY
Anesthesia: General | Site: Abdomen

## 2022-12-25 MED ORDER — MIDAZOLAM-SODIUM CHLORIDE 100-0.9 MG/100ML-% IV SOLN
INTRAVENOUS | Status: AC
  Filled 2022-12-25: qty 100

## 2022-12-25 MED ORDER — PHENYLEPHRINE 80 MCG/ML (10ML) SYRINGE FOR IV PUSH (FOR BLOOD PRESSURE SUPPORT)
PREFILLED_SYRINGE | INTRAVENOUS | Status: DC | PRN
  Administered 2022-12-25: 80 ug via INTRAVENOUS

## 2022-12-25 MED ORDER — TRAVASOL 10 % IV SOLN
INTRAVENOUS | Status: AC
  Filled 2022-12-25: qty 1244.4

## 2022-12-25 MED ORDER — ARGATROBAN 50 MG/50ML IV SOLN
0.1700 ug/kg/min | INTRAVENOUS | Status: DC
  Administered 2022-12-25 – 2022-12-26 (×2): 0.5 ug/kg/min via INTRAVENOUS
  Administered 2022-12-27 – 2022-12-29 (×2): 0.17 ug/kg/min via INTRAVENOUS
  Administered 2023-01-01: 0.15 ug/kg/min via INTRAVENOUS
  Administered 2023-01-03 – 2023-01-06 (×3): 0.17 ug/kg/min via INTRAVENOUS
  Filled 2022-12-25 (×8): qty 50

## 2022-12-25 MED ORDER — LIDOCAINE 2% (20 MG/ML) 5 ML SYRINGE
INTRAMUSCULAR | Status: AC
  Filled 2022-12-25: qty 5

## 2022-12-25 MED ORDER — POLYETHYLENE GLYCOL 3350 17 G PO PACK: 17.0000 g | PACK | Freq: Every day | ORAL | Status: DC

## 2022-12-25 MED ORDER — INDOCYANINE GREEN 25 MG IV SOLR
INTRAVENOUS | Status: AC
  Filled 2022-12-25: qty 10

## 2022-12-25 MED ORDER — PHENYLEPHRINE 80 MCG/ML (10ML) SYRINGE FOR IV PUSH (FOR BLOOD PRESSURE SUPPORT)
PREFILLED_SYRINGE | INTRAVENOUS | Status: AC
  Filled 2022-12-25: qty 10

## 2022-12-25 MED ORDER — EPHEDRINE 5 MG/ML INJ
INTRAVENOUS | Status: AC
  Filled 2022-12-25: qty 5

## 2022-12-25 MED ORDER — HYDROMORPHONE HCL 1 MG/ML IJ SOLN
INTRAMUSCULAR | Status: AC
  Filled 2022-12-25: qty 0.5

## 2022-12-25 MED ORDER — ALBUMIN HUMAN 5 % IV SOLN
INTRAVENOUS | Status: DC | PRN
Start: 2022-12-25 — End: 2022-12-25

## 2022-12-25 MED ORDER — MIDAZOLAM HCL 2 MG/2ML IJ SOLN
INTRAMUSCULAR | Status: AC
  Filled 2022-12-25: qty 2

## 2022-12-25 MED ORDER — 0.9 % SODIUM CHLORIDE (POUR BTL) OPTIME
TOPICAL | Status: DC | PRN
  Administered 2022-12-25: 1000 mL
  Administered 2022-12-25: 2000 mL

## 2022-12-25 MED ORDER — METOPROLOL TARTRATE 5 MG/5ML IV SOLN
5.0000 mg | INTRAVENOUS | Status: DC | PRN
  Administered 2022-12-25 – 2022-12-26 (×4): 5 mg via INTRAVENOUS
  Filled 2022-12-25 (×4): qty 5

## 2022-12-25 MED ORDER — HEPARIN (PORCINE) 25000 UT/250ML-% IV SOLN: 2750.0000 [IU]/h | INTRAVENOUS | Status: DC

## 2022-12-25 MED ORDER — PROPOFOL 10 MG/ML IV BOLUS
INTRAVENOUS | Status: AC
  Filled 2022-12-25: qty 20

## 2022-12-25 MED ORDER — LACTATED RINGERS IV BOLUS
750.0000 mL | Freq: Once | INTRAVENOUS | Status: AC
  Administered 2022-12-25: 750 mL via INTRAVENOUS

## 2022-12-25 MED ORDER — HYDROMORPHONE HCL 1 MG/ML IJ SOLN
INTRAMUSCULAR | Status: DC | PRN
Start: 2022-12-25 — End: 2022-12-25
  Administered 2022-12-25: 1 mg via INTRAVENOUS

## 2022-12-25 MED ORDER — ROCURONIUM BROMIDE 10 MG/ML (PF) SYRINGE
PREFILLED_SYRINGE | INTRAVENOUS | Status: AC
  Filled 2022-12-25: qty 10

## 2022-12-25 MED ORDER — LACTATED RINGERS IV SOLN
INTRAVENOUS | Status: DC | PRN
Start: 2022-12-25 — End: 2022-12-25

## 2022-12-25 MED ORDER — DEXAMETHASONE SODIUM PHOSPHATE 10 MG/ML IJ SOLN
INTRAMUSCULAR | Status: AC
  Filled 2022-12-25: qty 1

## 2022-12-25 MED ORDER — DOCUSATE SODIUM 50 MG/5ML PO LIQD: 100.0000 mg | Freq: Two times a day (BID) | ORAL | Status: DC

## 2022-12-25 MED ORDER — INDOCYANINE GREEN 25 MG IV SOLR
INTRAVENOUS | Status: DC | PRN
  Administered 2022-12-25 (×2): 2.5 mg via INTRAVENOUS

## 2022-12-25 MED ORDER — MIDAZOLAM-SODIUM CHLORIDE 100-0.9 MG/100ML-% IV SOLN
0.0000 mg/h | INTRAVENOUS | Status: DC
  Administered 2022-12-25: 2 mg/h via INTRAVENOUS

## 2022-12-25 MED ORDER — MIDAZOLAM BOLUS VIA INFUSION
0.0000 mg | INTRAVENOUS | Status: DC | PRN
  Administered 2022-12-25: 2 mg via INTRAVENOUS

## 2022-12-25 MED ORDER — ONDANSETRON HCL 4 MG/2ML IJ SOLN
INTRAMUSCULAR | Status: AC
  Filled 2022-12-25: qty 2

## 2022-12-25 MED ORDER — ROCURONIUM BROMIDE 10 MG/ML (PF) SYRINGE
PREFILLED_SYRINGE | INTRAVENOUS | Status: DC | PRN
  Administered 2022-12-25 (×2): 50 mg via INTRAVENOUS

## 2022-12-25 SURGICAL SUPPLY — 42 items
APL PRP STRL LF DISP 70% ISPRP (MISCELLANEOUS) ×1
BAG COUNTER SPONGE SURGICOUNT (BAG) ×1 IMPLANT
BAG SPNG CNTER NS LX DISP (BAG) ×1
BNDG GAUZE DERMACEA FLUFF 4 (GAUZE/BANDAGES/DRESSINGS) IMPLANT
BNDG GZE DERMACEA 4 6PLY (GAUZE/BANDAGES/DRESSINGS) ×1
CANISTER SUCT 3000ML PPV (MISCELLANEOUS) ×1 IMPLANT
CHLORAPREP W/TINT 26 (MISCELLANEOUS) ×1 IMPLANT
COVER SURGICAL LIGHT HANDLE (MISCELLANEOUS) ×1 IMPLANT
DRAPE LAPAROSCOPIC ABDOMINAL (DRAPES) ×1 IMPLANT
DRAPE WARM FLUID 44X44 (DRAPES) ×1 IMPLANT
ELECT CAUTERY BLADE 6.4 (BLADE) ×1 IMPLANT
ELECT REM PT RETURN 9FT ADLT (ELECTROSURGICAL) ×1
ELECTRODE REM PT RTRN 9FT ADLT (ELECTROSURGICAL) ×1 IMPLANT
GAUZE PAD ABD 8X10 STRL (GAUZE/BANDAGES/DRESSINGS) IMPLANT
GAUZE SPONGE 4X4 12PLY STRL (GAUZE/BANDAGES/DRESSINGS) IMPLANT
GLOVE BIOGEL M STRL SZ7.5 (GLOVE) ×1 IMPLANT
GLOVE INDICATOR 8.0 STRL GRN (GLOVE) ×2 IMPLANT
GOWN SPEC L4 XLG W/TWL (GOWN DISPOSABLE) ×1 IMPLANT
GOWN STRL REUS W/ TWL LRG LVL3 (GOWN DISPOSABLE) ×1 IMPLANT
GOWN STRL REUS W/TWL LRG LVL3 (GOWN DISPOSABLE) ×3
HANDLE SUCTION POOLE (INSTRUMENTS) ×1 IMPLANT
KIT BASIN OR (CUSTOM PROCEDURE TRAY) ×1 IMPLANT
KIT TURNOVER KIT B (KITS) ×1 IMPLANT
LIGASURE IMPACT 36 18CM CVD LR (INSTRUMENTS) IMPLANT
NS IRRIG 1000ML POUR BTL (IV SOLUTION) ×2 IMPLANT
PACK GENERAL/GYN (CUSTOM PROCEDURE TRAY) ×1 IMPLANT
PAD ARMBOARD 7.5X6 YLW CONV (MISCELLANEOUS) ×1 IMPLANT
PENCIL SMOKE EVACUATOR (MISCELLANEOUS) ×1 IMPLANT
RELOAD STAPLE 60 2.6 WHT THN (STAPLE) IMPLANT
RELOAD STAPLER WHITE 60MM (STAPLE) ×3 IMPLANT
STAPLER POWER ECHELON 60 WIDE (STAPLE) IMPLANT
STAPLER RELOAD WHITE 60MM (STAPLE) ×3
STAPLER VISISTAT 35W (STAPLE) ×1 IMPLANT
SUCTION POOLE HANDLE (INSTRUMENTS) ×1
SUT NOVA NAB DX-16 0-1 5-0 T12 (SUTURE) IMPLANT
SUT PDS AB 1 TP1 96 (SUTURE) ×2 IMPLANT
SUT SILK 2 0 SH CR/8 (SUTURE) ×1 IMPLANT
SUT SILK 2 0 TIES 10X30 (SUTURE) ×1 IMPLANT
SUT SILK 3 0 SH CR/8 (SUTURE) ×1 IMPLANT
SUT SILK 3 0 TIES 10X30 (SUTURE) ×1 IMPLANT
SUT SILK 3 0SH CR/8 30 (SUTURE) IMPLANT
TOWEL GREEN STERILE (TOWEL DISPOSABLE) ×1 IMPLANT

## 2022-12-25 NOTE — Anesthesia Preprocedure Evaluation (Signed)
Anesthesia Evaluation  Patient identified by MRN, date of birth, ID band Patient unresponsive    Reviewed: Unable to perform ROS - Chart review onlyPreop documentation limited or incomplete due to emergent nature of procedure.  Airway Mallampati: Intubated       Dental   Pulmonary    breath sounds clear to auscultation       Cardiovascular hypertension, + Peripheral Vascular Disease   Rhythm:Regular     Neuro/Psych    GI/Hepatic ,GERD  ,,SMA stenosis/thrombosis and small bowel ischemia. S/p SMA thrombectomy and stent 9/12 Dr. Karin Lieu, exploratory laparotomy and small bowel resection   Endo/Other  negative endocrine ROS    Renal/GU Renal diseaseLab Results      Component                Value               Date                      NA                       137                 12/25/2022                K                        3.7                 12/25/2022                CO2                      22                  12/25/2022                GLUCOSE                  123 (H)             12/25/2022                BUN                      72 (H)              12/25/2022                CREATININE               6.97 (H)            12/25/2022                CALCIUM                  7.9 (L)             12/25/2022                GFR                      37.68 (L)           03/13/2022                EGFR  34.0                04/17/2022                GFRNONAA                 8 (L)               12/25/2022                Musculoskeletal   Abdominal   Peds  Hematology  (+) Blood dyscrasia, anemia Lab Results      Component                Value               Date                      WBC                      6.7                 12/25/2022                HGB                      9.4 (L)             12/25/2022                HCT                      29.0 (L)            12/25/2022                MCV                       80.3                12/25/2022                PLT                      152                 12/25/2022              Anesthesia Other Findings   Reproductive/Obstetrics                              Anesthesia Physical Anesthesia Plan  ASA: 4  Anesthesia Plan: General   Post-op Pain Management:    Induction: Intravenous and Inhalational  PONV Risk Score and Plan: 2 and Ondansetron  Airway Management Planned: Oral ETT  Additional Equipment: Arterial line  Intra-op Plan:   Post-operative Plan: Post-operative intubation/ventilation  Informed Consent:      History available from chart only  Plan Discussed with: CRNA and Surgeon  Anesthesia Plan Comments:          Anesthesia Quick Evaluation

## 2022-12-25 NOTE — Progress Notes (Signed)
Back from OR Looks like there was sig areas of ischemia w/ 2 areas of perforation,  another 66cm section of jejunum was resected and area of discontinuity was reanastomosed.   BP 139/78   Pulse (!) 126   Temp 98.2 F (36.8 C)   Resp 15   Ht 5\' 11"  (1.803 m)   Wt 106.4 kg   SpO2 94%   BMI 32.72 kg/m   General sedated on fent gtt. Will grimace to painful stim but otherwise minimally responsive HENT NCAT orally intubated Pulm cl  Card now tachycardic  Cvp down to 3 Abd soft. Dressing intact  Impression/plan S/p lap for SB resxn and reanastomosis Ventilator management  Post-op pain Tachycardia  Acute on chronic renal failure  Discussion  Has been running on hypertensive side even before surgery. Looks like his aline has developed a whip. But BP still 140-150s via cuff. The tachycardia is new. Could be pain but he's pretty heavily sedated. CVP down to 3 so could be volume  Plan Cont full vent support Will give bolus of LR and then recheck CVP and re-assess HR. I want to be careful not  to overload and compromise gut healing  Will also check stat CBC If no sig blood loss and still tachy after fluid challenge will try low dose BB   Simonne Martinet ACNP-BC [facility] Pulmonary/Critical Care Pager # [phone] OR # [phone] if no answer

## 2022-12-25 NOTE — Progress Notes (Signed)
[facility] NEPHROLOGY PROGRESS NOTE  Assessment/ Plan: Pt is a 67 y.o. 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   # Acute kidney injury on CKD IIIb: Baseline creatinine level around 2.0.  Acute kidney injury likely ischemic ATN due to ex lap surgery/IV contrast/hypotension with hemodynamic changes. -remains non-oliguric. Has responded to lasix over the weekend. Rate of Cr rise seems to be slowing down however given that he is going to the OR today, there is a high likelihood that he will require RRT within the next few days -no urgent indication to initiate RRT today. Has RIJ temp HD catheter (placed 9/14) -No need for diuretics today. -It has been discussed with the patient's wife on 9/14 for possible dialysis need. -Strict ins and out and close lab monitoring.   # Acute mesenteric ischemia: Status post small bowel resection and superior mesenteric artery stent placement.  Followed by vascular surgeon and currently on heparin.  going to the OR again today for re-exploration and possible closure of abd   # Vent dependent respiratory failure: Per PCCM.   # Anemia likely due to blood loss: Monitor CBC and transfuse as as per ICU's discretion.   # History of hypertension: Patient required pressors for hypotension in OR.  Currently BP acceptable.  Anthony Sar, MD [facility]  Subjective: Seen and examined at bedside.  Patient remains intubated and sedated.  Not on pressors.  Urine output is recorded around 1.1 L.  There is also output from NG tube and drains (NGT ~1.2L). Going to OR today. Objective Vital signs in last 24 hours: Vitals:   12/25/22 0400 12/25/22 0500 12/25/22 0600 12/25/22 0741  BP: (!) 111/52 (!)  122/52 (!) 128/55   Pulse: 66 61 75 69  Resp: 15 15 14 15   Temp: 98.4 F (36.9 C) 98.2 F (36.8 C) 98.6 F (37 C)   TempSrc:      SpO2: 100% 100% 98% 100%  Weight:  106.4 kg    Height:       Weight change:   Intake/Output Summary (Last 24 hours) at 12/25/2022 0810 Last data filed at 12/25/2022 0600 Gross per 24 hour  Intake 2613.72 ml  Output 2880 ml  Net -266.28 ml       Labs: RENAL PANEL Recent Labs  Lab 12/20/22 1441 12/20/22 1441 12/20/22 2318 12/21/22 1452 12/21/22 2228 12/21/22 2229 12/22/22 0458 12/22/22 0501 12/23/22 0516 12/24/22 0500 12/24/22 1937 12/25/22 0345  NA 138  --  138   < > 136   < > 135 137 135 136 136 137  K 3.8  --  3.2*   < > 3.2*   < > 3.8 3.9 3.8 3.7 3.6 3.7  CL 99  --  100   < > 101  --  103  --  103 102 100 105  CO2 29  --  27   < > 21*  --  22  --  22 21* 21* 22  GLUCOSE 126*  --  134*   < > 128*  --  141*  --  125* 126* 127* 123*  BUN 36*  --  37*   < > 34*  --  36*  --  46* 57* 66* 72*  CREATININE 3.00*  --  2.90*   < >  2.90*  --  3.32*  --  4.99* 6.02* 6.87* 6.97*  CALCIUM 8.6*  --  8.6*   < > 7.7*  --  8.0*  --  7.5* 7.6* 7.8* 7.9*  MG  --    < > 1.6*  --  1.4*  --  2.0  --  1.9 2.0  --  2.2  PHOS  --   --   --   --  4.2  --   --   --  5.3* 5.8*  --  6.0*  ALBUMIN 3.1*  --  2.8*  --   --   --  2.3*  --   --   --  1.8* 1.8*   < > = values in this interval not displayed.    Liver Function Tests: Recent Labs  Lab 12/22/22 0458 12/24/22 1937 12/25/22 0345  AST 45* 72* 79*  ALT 69* 29 32  ALKPHOS 57 38 45  BILITOT 1.1 0.6 0.7  PROT 5.6* 4.6* 5.0*  ALBUMIN 2.3* 1.8* 1.8*   No results for input(s): "LIPASE", "AMYLASE" in the last 168 hours. No results for input(s): "AMMONIA" in the last 168 hours. CBC: Recent Labs    12/22/22 0458 12/22/22 0501 12/23/22 0516 12/24/22 0500 12/25/22 0345  HGB 10.9* 11.2* 9.6* 9.2* 9.4*  MCV 79.5*  --  80.3 79.6* 80.3    Cardiac Enzymes: No results for input(s): "CKTOTAL", "CKMB",  "CKMBINDEX", "TROPONINI" in the last 168 hours. CBG: Recent Labs  Lab 12/24/22 1129 12/24/22 1629 12/24/22 1959 12/25/22 0003 12/25/22 0402  GLUCAP 106* 107* 123* 102* 97    Iron Studies: No results for input(s): "IRON", "TIBC", "TRANSFERRIN", "FERRITIN" in the last 72 hours. Studies/Results: DG Chest Port 1 View  Result Date: 12/23/2022 CLINICAL DATA:  Central line placement. EXAM: PORTABLE CHEST 1 VIEW COMPARISON:  One-view chest x-ray 12/20/2022 FINDINGS: Heart is enlarged. Scratched at the heart is enlarged. A right IJ sheath terminates in mid SVC. A left subclavian line terminates at the cavoatrial junction. Endotracheal tube terminates 0.4 cm above the carina. Side port of the gastric tube is just past the GE junction. Lung volumes are low. Mild atelectasis is worse left than right. No edema or significant airspace consolidation is present. IMPRESSION: 1. Low lung volumes. 2. Cardiomegaly without failure. 3. Support apparatus as described. 4. No pneumothorax. 5. Mild bibasilar atelectasis. 6. No acute cardiopulmonary disease. Electronically Signed   By: Marin Roberts M.D.   On: 12/23/2022 14:23   PERIPHERAL VASCULAR CATHETERIZATION  Result Date: 12/23/2022 See surgical note for result.   Medications: Infusions:  clevidipine Stopped (12/23/22 1522)   fentaNYL infusion INTRAVENOUS 200 mcg/hr (12/25/22 0600)   heparin 2,750 Units/hr (12/25/22 0600)   piperacillin-tazobactam (ZOSYN)  IV Stopped (12/25/22 0545)   propofol (DIPRIVAN) infusion 40 mcg/kg/min (12/25/22 0643)   TPN ADULT (ION) 70 mL/hr at 12/25/22 0600    Scheduled Medications:  aspirin  300 mg Rectal Daily   Chlorhexidine Gluconate Cloth  6 each Topical Daily   fentaNYL (SUBLIMAZE) injection  100 mcg Intravenous Once   insulin aspart  0-9 Units Subcutaneous Q4H   midazolam  2 mg Intravenous Once   mouth rinse  15 mL Mouth Rinse Q2H   pantoprazole (PROTONIX) IV  40 mg Intravenous QHS   sodium chloride flush   3 mL Intravenous Q12H    have reviewed scheduled and prn medications.  Physical Exam: General: Intubated and sedated. Heart:RRR, s1s2 nl Lungs: Coarse breath sound  bilateral. intubated Abdomen: open abdomen, +  wound vac Extremities:No edema Neurology: Sedated Dialysis access: RIJ temp HD catheter  Sylwia Cuervo 12/25/2022,8:10 AM  LOS: 5 days

## 2022-12-25 NOTE — Progress Notes (Signed)
  Progress Note    12/25/2022 7:57 AM 2 Days Post-Op  Subjective:  intubated and sedated    Vitals:   12/25/22 0600 12/25/22 0741  BP: (!) 128/55   Pulse: 75 69  Resp: 14 15  Temp: 98.6 F (37 C)   SpO2: 98% 100%    Physical Exam: General:  intubated Lungs:  on vent Extremities:  right PT and left DP doppler signals Abdomen:  open abdomen with wound vac with good seal  CBC    Component Value Date/Time   WBC 6.7 12/25/2022 0345   RBC 3.61 (L) 12/25/2022 0345   HGB 9.4 (L) 12/25/2022 0345   HCT 29.0 (L) 12/25/2022 0345   PLT 152 12/25/2022 0345   MCV 80.3 12/25/2022 0345   MCH 26.0 12/25/2022 0345   MCHC 32.4 12/25/2022 0345   RDW 14.4 12/25/2022 0345   LYMPHSABS 0.9 10/30/2022 1654   MONOABS 0.6 10/30/2022 1654   EOSABS 0.3 10/30/2022 1654   BASOSABS 0.0 10/30/2022 1654    BMET    Component Value Date/Time   NA 137 12/25/2022 0345   K 3.7 12/25/2022 0345   CL 105 12/25/2022 0345   CO2 22 12/25/2022 0345   GLUCOSE 123 (H) 12/25/2022 0345   BUN 72 (H) 12/25/2022 0345   CREATININE 6.97 (H) 12/25/2022 0345   CALCIUM 7.9 (L) 12/25/2022 0345   GFRNONAA 8 (L) 12/25/2022 0345   GFRAA >60 04/14/2015 1127    INR No results found for: "INR"   Intake/Output Summary (Last 24 hours) at 12/25/2022 0757 Last data filed at 12/25/2022 0600 Gross per 24 hour  Intake 2613.72 ml  Output 2930 ml  Net -316.28 ml      Assessment/Plan:  67 y.o. male is s/p: ex lap with SMA stenting and small bowel resection  -Open abdomen with wound vac with good seal -BLE with brisk doppler signals, right PT and left DP -No leukocytosis, WBC stable at 6.7 -Plans to return to OR with general surgery today for re-exploration   Loel Dubonnet, PA-C [facility] [phone] 12/25/2022 7:57 AM

## 2022-12-25 NOTE — Progress Notes (Signed)
PHARMACY - TOTAL PARENTERAL NUTRITION CONSULT NOTE  Indication:  SMA syndrome w associated weight loss  Patient Measurements: Height: 5\' 11"  (180.3 cm) Weight: 106.4 kg (234 lb 9.1 oz) IBW/kg (Calculated) : 75.3 TPN AdjBW (KG): 80.4 Body mass index is 32.72 kg/m. Usual Weight: 95 kg, has lost 50lbs recently   Assessment:  67 yo M admitted with abdominal pain, weight loss, and poor PO intake. 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 He is now s/p SMA stent placement. He is currently left is discontinuity w open abdomen.  Glucose / Insulin: A1c 5.3 - CBGs < 180 Used 3 units SSI in the past 24 hrs Electrolytes: all WNL except elevated Phos at 6 (no Phos in TPN, CoCa 9.66 post 2gm IV.  Ca x Phos = 58, goal < 55) Renal: SCr up 6.97, BUN up to 72 Hepatic: LFTs WNL except mildly elevated AST, tbili / TG WNL, albumin 1.8 Intake / Output; MIVF: UOP 0.5 ml/kg/hr (last Lasix 9/14), NG , wound vac , net +7L GI Imaging: none since TPN initiation  GI Surgeries / Procedures:  9/14 re-ex lap, left in discontinuity with plans for re-exploration   Central access: CVC LIJ 12/23/22 TPN start date: 12/23/22  Nutritional Goals: Goal TPN rate is 90 mL/hr (provides 124g AA and 1912 kCal per day)  RD Estimated Needs Total Energy Estimated Needs: 1900-2100 kacls Total Protein Estimated Needs: 120-140 g Total Fluid Estimated Needs: 1.7 L  Current Nutrition:  TPN Propofol at 30 mcg/kg/min (17 ml/hr) >> 448 kCal/day   Plan:  Change Propofol to Versed, possibly Precedex if abdomen is closed per discussion with CCM - adjust TPN to meet 100% of needs Unable to concentrate TPN due to AA shortage TPN at 85 ml/hr starting at 1800 Electrolytes in TPN: Na 22mEq/L, K 12 mEq/L (~25 mEq/day), Ca 8 mEq/L, Mg 5 mEq/L, Phos 0 mmol/L, max acetate Add standard MVI and trace elements to TPN, no chromium d/t shortage Continue sensitive SSI  Q4H for now Monitor TPN labs on Mon/Thurs - labs in AM F/u need for RRT F/u post OR  Jaray Boliver D. Laney Potash, PharmD, BCPS, BCCCP 12/25/2022, 10:36 AM

## 2022-12-25 NOTE — Interval H&P Note (Signed)
History and Physical Interval Note:  12/25/2022 10:52 AM  [name] [name]  has presented today for surgery, with the diagnosis of OPEN ABDOMEN.  The various methods of treatment have been discussed with the patient and family. After consideration of risks, benefits and other options for treatment, the patient has consented to  Procedure(s): EXPLORATION LAPAROTOMY POSSIBLE CLOUSURE SMALL RESECTION (N/A) as a surgical intervention.  The patient's history has been reviewed, patient examined, no change in status, stable for surgery.  I have reviewed the patient's chart and labs.  Questions were answered to the patient's satisfaction.    I discussed the procedure in detail with wife via phone   We discussed the risks and benefits of surgery including, but not limited to bleeding, infection (such as wound infection, abdominal abscess), injury to surrounding structures, blood clot formation, urinary retention, incisional hernia, anastomotic stricture, anastomotic leak, anesthesia risks, pulmonary & cardiac complications such as pneumonia &/or heart attack, need for additional procedures, ileus, & prolonged hospitalization.  We discussed the typical postoperative recovery course, including limitations & restrictions postoperatively. I explained that the likelihood of improvement in their symptoms is good.  Gaynelle Adu

## 2022-12-25 NOTE — Transfer of Care (Signed)
Immediate Anesthesia Transfer of Care Note  Patient: [name] [name]  Procedure(s) Performed: RE-EXPLORATION LAPAROTOMY W/CLOSURE (Abdomen) SMALL BOWEL RESECTION W/ANASTOMOSIS X2 (Abdomen)  Patient Location: PACU  Anesthesia Type:General  Level of Consciousness: sedated and Patient remains intubated per anesthesia plan  Airway & Oxygen Therapy: Patient remains intubated per anesthesia plan  Post-op Assessment: Report given to RN and Post -op Vital signs reviewed and stable  Post vital signs: Reviewed and stable  Last Vitals:  Vitals Value Taken Time  BP    Temp    Pulse    Resp    SpO2      Last Pain:  Vitals:   12/22/22 0800  TempSrc: Esophageal  PainSc:       Patients Stated Pain Goal: 0 (12/21/22 0212)  Complications: No notable events documented.

## 2022-12-25 NOTE — Progress Notes (Signed)
ANTICOAGULATION CONSULT NOTE - Follow Up  Pharmacy Consult for heparin Indication: SMA stenosis/occlusion  Allergies  Allergen Reactions   Zestril [Lisinopril] Cough    Patient Measurements: Height: 5\' 11"  (180.3 cm) Weight: 106.4 kg (234 lb 9.1 oz) IBW/kg (Calculated) : 75.3 Heparin Dosing Weight: 94.6kg  Vital Signs: Temp: 98.2 F (36.8 C) (09/16 1500) BP: 139/78 (09/16 1445) Pulse Rate: 126 (09/16 1500)  Labs: Recent Labs    12/23/22 0516 12/23/22 2315 12/24/22 0500 12/24/22 0806 12/24/22 1900 12/24/22 1937 12/25/22 0000 12/25/22 0345 12/25/22 0937  HGB 9.6*  --  9.2*  --   --   --   --  9.4*  --   HCT 30.1*  --  28.9*  --   --   --   --  29.0*  --   PLT 187  --  176  --   --   --   --  152  --   HEPARINUNFRC 0.13*   < >  --    < > <0.10*  --  <0.10*  --  <0.10*  CREATININE 4.99*  --  6.02*  --   --  6.87*  --  6.97*  --    < > = values in this interval not displayed.    Estimated Creatinine Clearance: 12.6 mL/min (A) (by C-G formula based on SCr of 6.97 mg/dL (H)).   Assessment: 89 YOM presenting with abdominal pain found to have mesenteric ischemia. Pharmacy consulted for IV heparin. No AC PTA. Pt s/p ex lap, bowel resection, thrombectomy, and angioplasty 9/12, heparin resumed postop.  Despite multiple rate increases, unable to get heparin to a detectable level.  No issues with IV infusions.  ?AT3 deficiency?  Discussed with surgery and CCM, will transition patient to direct thrombin inhibitor argatroban (preferred over bivalirudin in renal dysfunction).  Goal of Therapy:  Heparin level 0.3-0.5 units/ml Monitor platelets by anticoagulation protocol: Yes   Plan:  Per surgery - can resume anticoagulation at 8 pm. Start argatroban at 0.5 mcg/kg/min tonight at 8 pm. Check aPTT 4 hrs after infusion starts. Daily CBC and aPTT.  Reece Leader, Colon Flattery, BCCP Clinical Pharmacist  12/25/2022 3:38 PM   [facility] pharmacy phone numbers are listed on  [email or web address]

## 2022-12-25 NOTE — Progress Notes (Signed)
Pt just taken to OR by anesthesia, vent on standby in pt room

## 2022-12-25 NOTE — Progress Notes (Signed)
ANTICOAGULATION CONSULT NOTE - Follow Up  Pharmacy Consult for heparin Indication: SMA stenosis/occlusion  Allergies  Allergen Reactions   Zestril [Lisinopril] Cough    Patient Measurements: Height: 5\' 11"  (180.3 cm) Weight: 96.7 kg (213 lb 3 oz) IBW/kg (Calculated) : 75.3 Heparin Dosing Weight: 94.6kg  Vital Signs: Temp: 98.1 F (36.7 C) (09/16 0000) BP: 134/55 (09/16 0000) Pulse Rate: 69 (09/16 0000)  Labs: Recent Labs    12/22/22 0458 12/22/22 0501 12/22/22 1014 12/23/22 0516 12/23/22 2315 12/24/22 0500 12/24/22 0806 12/24/22 1900 12/24/22 1937 12/25/22 0000  HGB 10.9* 11.2*  --  9.6*  --  9.2*  --   --   --   --   HCT 34.5* 33.0*  --  30.1*  --  28.9*  --   --   --   --   PLT 223  --   --  187  --  176  --   --   --   --   HEPARINUNFRC 0.39  --    < > 0.13*   < >  --  <0.10* <0.10*  --  <0.10*  CREATININE 3.32*  --   --  4.99*  --  6.02*  --   --  6.87*  --    < > = values in this interval not displayed.    Estimated Creatinine Clearance: 12.2 mL/min (A) (by C-G formula based on SCr of 6.87 mg/dL (H)).   Assessment: 71 YOM presenting with abdominal pain found to have mesenteric ischemia. Pharmacy consulted for IV heparin. No AC PTA. Pt s/p ex lap, bowel resection, thrombectomy, and angioplasty 9/12, heparin resumed postop.  Abdomen remains open - no bleeding issues. Given open abdomen will utilize lower therapeutic range for now.  Heparin level remains undetectable on infusion at 2550 units/hr. Heparin infusion moved to central line so level should be accurate.  Goal of Therapy:  Heparin level 0.3-0.5 units/ml Monitor platelets by anticoagulation protocol: Yes   Plan:  Increase heparin infusion to 2750 units/hr Will f/u when heparin to be turned off for OR - pt not on schedule yet F/u 8 hr heparin level if pt not to OR yet  Thank you for allowing pharmacy to participate in this patient's care,  Christoper Fabian, PharmD, BCPS Please see amion for  complete clinical pharmacist phone list 12/25/2022 1:22 AM

## 2022-12-25 NOTE — Op Note (Signed)
12/25/2022  2:27 PM  PATIENT:  [name] [name]  67 y.o. male  PRE-OPERATIVE DIAGNOSIS:  OPEN ABDOMEN, mesenteric ischemia, bowel discontinuity   POST-OPERATIVE DIAGNOSIS:  same + perforated ischemic small bowel  PROCEDURE:  Procedure(s): RE-EXPLORATION LAPAROTOMY W/CLOSURE SMALL BOWEL RESECTION W/ANASTOMOSIS X2 Assessment of bowel perfusion with indocyanine green fluorescence SURGEON:  Surgeon(s): Gaynelle Adu, MD  ASSISTANTS: Barnetta Chapel PA-C   ANESTHESIA:   general  DRAINS: Nasogastric Tube and Urinary Catheter (Foley)   LOCAL MEDICATIONS USED:  NONE  SPECIMEN:  Source of Specimen:  jejunum - sutures mark perforation  DISPOSITION OF SPECIMEN:  PATHOLOGY  COUNTS:  YES  EBL: minimal  FINDINGS: The section of distal jejunum that was of concern 48 hours prior was still ischemic in nature.  When peeling the loops of small bowel apart in this area the tissue gave away in 2 areas  with perforation.  The proximal jejunum was viable and the area that had been previously resected those 2 staple lines were viable.  I ended up resecting a 66 cm section of jejunum that was ischemic in nature that had 2 areas of perforation.  There was an anastomosis performed in this location and the previously resected area proximal small bowel that had been left in discontinuity was reanastomosed.  The patient had a prior small bowel anastomosis that is located between these 2 new anastomoses  INDICATION FOR PROCEDURE: 67 year old gentleman who was in the ICU with an open abdomen.  The patient had presented with SMA stenosis with acute mesenteric ischemia.  He underwent revascularization with stenting of his SMA.  During that time he was found to have ischemic small bowel and underwent a proximal resection without anastomosis.  There was an area of distal's jejunum that appeared questionable so he was left open in discontinuity.  He was brought back on July 14 for reexploration where that section of small  bowel in the mid jejunum still appeared somewhat questionable but not overtly nonviable.  He was brought back today for reexploration  PROCEDURE: Patient was brought directly from the ICU to the operating room where he was placed upon operating table.  General anesthesia was induced.  The outer layer of the ABThera device was removed and the abdomen was prepped and draped in sterile fashion with Betadine.  The patient was on therapeutic scheduled antibiotics.  A timeout was performed according to the surgical safety checklist.  The inner layer of the ABThera was then removed.  The omentum was gently lifted up off of the small bowel it was already started to be adhered.  The the small bowel loops were somewhat fixed to 1 another but I was able to gently and carefully separate them but in the area of mid jejunum that was frankly ischemic when separating the layers there were 2 areas that essentially fell apart resulting in perforation.  These areas were closed quickly with interrupted silk sutures.  The spilled enteral contents were evacuated with suctioning.  The proximal area of the small bowel that had been resected and left in discontinuity was quite viable.  Distal to that was his prior small bowel resection that had been done in 2023.  And then slightly downstream from this was the area of frank ischemia that had perforated.  The distal ileum appeared normal.  There was some lymphatic congestion in the terminal ileum and cecum.  Blood flow was assessed in the mesentery with Doppler.  There was a good triphasic signal in the base of the  small bowel mesentery.  The area of the jejunum that appeared frankly ischemic had perhaps a  Weak monophasic signal.  The terminal ileum had a good biphasic signal.  At this point, I had anesthesia inject 2-1/2 mg of indocyanine green and then we activated fluorescent imaging and visualized good blood flow in the proximal small bowel and in the distal small bowel.  The area of  the jejunum that was of concern had little to no fluorescent activity.  Vascular surgery was in the operating room and listen to the Doppler signals and were happy with blood flow in the SMA.  At this point I decided to resect the area of jejunum that was frankly ischemic.  I went several inches upstream and made a small rent in the mesentery with a Kelly.  We then divided the small bowel with an Echelon 60 stapler with a white load.  We then took down the mesentery sequentially with LigaSure device.  I then transected the distal jejunum with another fire of the Echelon 60 stapler.  This section of small bowel contained the 2 areas of perforation.  This was grossly 66 cm in length.  I then performed a side-to-side functional end-to-end stapled enteroenterostomy.  The common channel was created with a single fire of the Echelon 60 stapler with a white load.  The common enterotomy was then reapproximated with interrupted 3-0 silk sutures.  A tension relieving anastomosis suture was placed in the crotch with a 3-0 silk suture.  The mesenteric defect was then closed with interrupted and figure-of-eight 2-0 silk sutures.  I then turned my attention to the proximal small bowel that had been resected the prior day that it been left in discontinuity.  We then performed a another side-to-side functional end-to-end stapled enteroenterostomy.  The common channel was created with a single fire of an Echelon 60 stapler with a white load.  The common enterotomy was then again reapproximated with multiple interrupted 3-0 silk sutures.  The small mesenteric defect was then closed with 2-0 silk sutures.  It should be noted that prior to performing each of these anastomosis we had anesthesia reinject the patient with indocyanine green and I reinspected the bowel perfusion and there was excellent perfusion to both ends of the small bowel at each of these anastomoses.  We then irrigated the abdomen with saline.  I had anesthesia  changed out his OG to an NG and we confirmed tube placement.  I then closed the fascia with a running looped #1 PDS x 2 and tied centrally.  There was several interrupted #1 Novafil's places internal retention sutures.  Wet-to-dry dressing was applied.  All needle, instrument sponge counts were correct x 2.  There were no immediate complications.  The patient was taken back to the ICU and remained intubated.  PLAN OF CARE:  already inpatient  PATIENT DISPOSITION:  ICU - intubated and hemodynamically stable.   Delay start of Pharmacological VTE agent (>24hrs) due to surgical blood loss or risk of bleeding:  can start heparin gtt at 2000 without bolus  Mary Sella. Andrey Campanile, MD, FACS General, Bariatric, & Minimally Invasive Surgery [facility], [place]

## 2022-12-25 NOTE — Progress Notes (Signed)
NAME:  [name] [name], MRN:  [id number], DOB:  [birth year], LOS: 5 ADMISSION DATE:  12/20/2022, CONSULTATION DATE: 9/12 REFERRING MD: Dr. Jacqulyn Bath, CHIEF COMPLAINT: Mesenteric ischemia  History of Present Illness:   67 year old male with past medical history as below, which is significant for intestinal stromal tumor removal in 2022, known mesenteric stenosis, GERD, hypertension, and hyperlipidemia.  In the few weeks prior to presentation on 9/11 he had been evaluated at several campuses for abdominal pain with noncontrasted CTs of the abdomen which were unrevealing.  He continued to have abdominal pain, weight loss, and poor p.o. intake.  He presented [name] on 9/11 and was admitted to the hospitalist for abdominal pain with concern for superior mesenteric artery thrombus.  He was started on heparin infusion.  Vascular surgery was consulted and ultimately a CT angiogram of the abdomen and pelvis was done 9/12 demonstrating acute occlusion of the distal SMA as well as occlusion of the inferior mesenteric artery.  He was taken to the operating room emergently and and was found to have an necrotic area of the jejunum.  This was resected, and SMA stent was placed, and the patient was left with an open abdomen.  He also remained on the mechanical ventilator postoperatively and PCCM was asked to admit for ventilator management.  Pertinent  Medical History   has a past medical history of Ankle fracture, Cancer (HCC) (1974), Cataract, GERD (gastroesophageal reflux disease), colonic polyps, Hyperlipidemia, Hypertension, Kidney stones, Patella fracture, Retinal detachment, Retinal tear of right eye, and Syncopal episodes (2008).   Significant Hospital Events: Including procedures, antibiotic start and stop dates in addition to other pertinent events   9/11 admitted with abdominal pain 9/12 CT demonstrating mesenteric occlusion, bowel resection and stent placed, to ICU on vent. Left in discontinuity  9/14  back to OR. Left in discontinuity as still concern about bowel integrity 9/16 back to OR    Interim History / Subjective:  Remains heavily sedated.  Requiring as needed fentanyl Objective   Blood pressure (!) 130/58, pulse 77, temperature 99.7 F (37.6 C), resp. rate 16, height 5\' 11"  (1.803 m), weight 106.4 kg, SpO2 99%. CVP:  [3 mmHg-13 mmHg] 8 mmHg  Vent Mode: PRVC FiO2 (%):  [40 %] 40 % Set Rate:  [15 bmp] 15 bmp Vt Set:  [600 mL] 600 mL PEEP:  [5 cmH20] 5 cmH20 Pressure Support:  [8 cmH20] 8 cmH20 Plateau Pressure:  [13 cmH20-18 cmH20] 13 cmH20   Intake/Output Summary (Last 24 hours) at 12/25/2022 7829 Last data filed at 12/25/2022 0800 Gross per 24 hour  Intake 2885.68 ml  Output 2830 ml  Net 55.68 ml   Filed Weights   12/21/22 0458 12/22/22 0615 12/25/22 0500  Weight: 98.6 kg 96.7 kg 106.4 kg    Examination:  General 67 year old male patient currently sedated on vent HEENT normocephalic atraumatic no jugular venous distention orally intubated jugular central lines are unremarkable Pulmonary: Clear to auscultation Cardiac: Regular rate and rhythm Abdomen: Soft, bowel sounds absent.  Wound VAC in place Extremities: Warm dry brisk cap refill trace edema Neuro: Heavily sedated. Resolved Hospital Problem list     Assessment & Plan:   Acute mesenteric ischemia: Status post small bowel resection and superior mesenteric artery stent placement 9/12.  Open abdomen, left in discontinuity since 9/12.  Plan NPO (no feeds or meds) TPN Heparin gtt per vascular  Wound care as directed by surgical team  Day 5 of zosyn   Postoperative ventilator management-  Plan  Continuing full ventilator support PAD protocol RASS goal -4 Vap bundle  Am cxr   AKI on CKD3: in context of mesenteric ischemia, fluid shifts and likely ATN.   Scr and BUN still climbing. Today 6.97.  no urgent need for iHD but heading that way Plan Keep euvolemic Avoid hypotension Renal dose meds Am chem   Additional recs per renal Has internal jugular cath for dialysis already   Postop anemia- expected in context of blood losses from surgery and bone marrow stunning in context of critical illness Plan Trend cbc Trigger for transfusion < 7   Protein calorie malnutrition : significant weight loss prior to this admission, now NPO Plan TPN  Hx HTN, cataracts, GERD, HLD Plan Holding Home Catapres, chlorthalidone, Cozaar and Crestor  Best Practice (right click and "Reselect all SmartList Selections" daily)   Diet/type: TPN DVT prophylaxis: heparin gtt GI prophylaxis: PPI Lines: Arterial Line Foley:  Yes, and it is still needed Code Status:  full code Last date of multidisciplinary goals of care discussion [updated wife at bedside 9/14]   My time 67 min Simonne Martinet ACNP-BC [facility] Pulmonary/Critical Care Pager # [phone] OR # [phone] if no answer

## 2022-12-26 ENCOUNTER — Inpatient Hospital Stay (HOSPITAL_COMMUNITY): Payer: Medicare Other

## 2022-12-26 ENCOUNTER — Encounter (HOSPITAL_COMMUNITY): Payer: Self-pay | Admitting: General Surgery

## 2022-12-26 DIAGNOSIS — I701 Atherosclerosis of renal artery: Secondary | ICD-10-CM

## 2022-12-26 DIAGNOSIS — K551 Chronic vascular disorders of intestine: Secondary | ICD-10-CM | POA: Diagnosis not present

## 2022-12-26 LAB — GLUCOSE, CAPILLARY
Glucose-Capillary: 105 mg/dL — ABNORMAL HIGH (ref 70–99)
Glucose-Capillary: 124 mg/dL — ABNORMAL HIGH (ref 70–99)
Glucose-Capillary: 128 mg/dL — ABNORMAL HIGH (ref 70–99)
Glucose-Capillary: 141 mg/dL — ABNORMAL HIGH (ref 70–99)
Glucose-Capillary: 142 mg/dL — ABNORMAL HIGH (ref 70–99)
Glucose-Capillary: 154 mg/dL — ABNORMAL HIGH (ref 70–99)

## 2022-12-26 LAB — BASIC METABOLIC PANEL
Anion gap: 12 (ref 5–15)
BUN: 86 mg/dL — ABNORMAL HIGH (ref 8–23)
CO2: 21 mmol/L — ABNORMAL LOW (ref 22–32)
Calcium: 8 mg/dL — ABNORMAL LOW (ref 8.9–10.3)
Chloride: 102 mmol/L (ref 98–111)
Creatinine, Ser: 7.1 mg/dL — ABNORMAL HIGH (ref 0.61–1.24)
GFR, Estimated: 8 mL/min — ABNORMAL LOW (ref >60)
Glucose, Bld: 153 mg/dL — ABNORMAL HIGH (ref 70–99)
Potassium: 4.1 mmol/L (ref 3.5–5.1)
Sodium: 135 mmol/L (ref 135–145)

## 2022-12-26 LAB — CBC
HCT: 30.3 % — ABNORMAL LOW (ref 39.0–52.0)
Hemoglobin: 9.4 g/dL — ABNORMAL LOW (ref 13.0–17.0)
MCH: 24.9 pg — ABNORMAL LOW (ref 26.0–34.0)
MCHC: 31 g/dL (ref 30.0–36.0)
MCV: 80.2 fL (ref 80.0–100.0)
Platelets: 149 10*3/uL — ABNORMAL LOW (ref 150–400)
RBC: 3.78 MIL/uL — ABNORMAL LOW (ref 4.22–5.81)
RDW: 14.6 % (ref 11.5–15.5)
WBC: 14.6 10*3/uL — ABNORMAL HIGH (ref 4.0–10.5)
nRBC: 0 % (ref 0.0–0.2)

## 2022-12-26 LAB — APTT
aPTT: 63 s — ABNORMAL HIGH (ref 24–36)
aPTT: 74 s — ABNORMAL HIGH (ref 24–36)
aPTT: 77 s — ABNORMAL HIGH (ref 24–36)
aPTT: 80 s — ABNORMAL HIGH (ref 24–36)

## 2022-12-26 LAB — MAGNESIUM: Magnesium: 2 mg/dL (ref 1.7–2.4)

## 2022-12-26 LAB — PHOSPHORUS: Phosphorus: 6 mg/dL — ABNORMAL HIGH (ref 2.5–4.6)

## 2022-12-26 LAB — SURGICAL PATHOLOGY

## 2022-12-26 MED ORDER — ALTEPLASE 2 MG IJ SOLR
2.0000 mg | Freq: Once | INTRAMUSCULAR | Status: AC
  Administered 2022-12-26: 2 mg

## 2022-12-26 MED ORDER — ACETAMINOPHEN 10 MG/ML IV SOLN: 1000.0000 mg | Freq: Four times a day (QID) | INTRAVENOUS | Status: AC | PRN

## 2022-12-26 MED ORDER — DEXMEDETOMIDINE HCL IN NACL 400 MCG/100ML IV SOLN
0.0000 ug/kg/h | INTRAVENOUS | Status: DC
  Administered 2022-12-26: 0.4 ug/kg/h via INTRAVENOUS
  Filled 2022-12-26: qty 100

## 2022-12-26 MED ORDER — ALTEPLASE 2 MG IJ SOLR
INTRAMUSCULAR | Status: AC
  Filled 2022-12-26: qty 2

## 2022-12-26 MED ORDER — LABETALOL HCL 5 MG/ML IV SOLN
10.0000 mg | INTRAVENOUS | Status: DC | PRN
  Administered 2022-12-27 – 2022-12-28 (×3): 10 mg via INTRAVENOUS
  Filled 2022-12-26 (×4): qty 4

## 2022-12-26 MED ORDER — FENTANYL CITRATE PF 50 MCG/ML IJ SOSY
25.0000 ug | PREFILLED_SYRINGE | INTRAMUSCULAR | Status: DC | PRN
  Administered 2022-12-28 – 2022-12-29 (×4): 50 ug via INTRAVENOUS
  Filled 2022-12-26 (×5): qty 1

## 2022-12-26 MED ORDER — FENTANYL CITRATE PF 50 MCG/ML IJ SOSY
25.0000 ug | PREFILLED_SYRINGE | INTRAMUSCULAR | Status: DC | PRN
  Administered 2022-12-26: 25 ug via INTRAVENOUS

## 2022-12-26 MED ORDER — TRAVASOL 10 % IV SOLN
INTRAVENOUS | Status: AC
  Filled 2022-12-26: qty 1244.4

## 2022-12-26 NOTE — Progress Notes (Addendum)
NAME:  [name] [name], MRN:  [id number], DOB:  [birth year], LOS: 6 ADMISSION DATE:  12/20/2022, CONSULTATION DATE: 9/12 REFERRING MD: Dr. Jacqulyn Bath, CHIEF COMPLAINT: Mesenteric ischemia  History of Present Illness:   67 year old male with past medical history as below, which is significant for intestinal stromal tumor removal in 2022, known mesenteric stenosis, GERD, hypertension, and hyperlipidemia.  In the few weeks prior to presentation on 9/11 he had been evaluated at several campuses for abdominal pain with noncontrasted CTs of the abdomen which were unrevealing.  He continued to have abdominal pain, weight loss, and poor p.o. intake.  He presented [name] on 9/11 and was admitted to the hospitalist for abdominal pain with concern for superior mesenteric artery thrombus.  He was started on heparin infusion.  Vascular surgery was consulted and ultimately a CT angiogram of the abdomen and pelvis was done 9/12 demonstrating acute occlusion of the distal SMA as well as occlusion of the inferior mesenteric artery.  He was taken to the operating room emergently and and was found to have an necrotic area of the jejunum.  This was resected, and SMA stent was placed, and the patient was left with an open abdomen.  He also remained on the mechanical ventilator postoperatively and PCCM was asked to admit for ventilator management.  Pertinent  Medical History   has a past medical history of Ankle fracture, Cancer (HCC) (1974), Cataract, GERD (gastroesophageal reflux disease), colonic polyps, Hyperlipidemia, Hypertension, Kidney stones, Patella fracture, Retinal detachment, Retinal tear of right eye, and Syncopal episodes (2008).   Significant Hospital Events: Including procedures, antibiotic start and stop dates in addition to other pertinent events   9/11 admitted with abdominal pain 9/12 CT demonstrating mesenteric occlusion, bowel resection and stent placed, to ICU on vent. Left in discontinuity  9/14  back to OR. Left in discontinuity as still concern about bowel integrity 9/16 back to OR  s sig areas of ischemia w/ 2 areas of perforation, another 66cm section of jejunum was resected and area of discontinuity was reanastomosed. Changed to argatroban post-op as unable to get heparin levels detectable  9/17 dropping RASS goal to 0 to -1, assessing for readiness to wean, serum creatinine and BUN still climbing some but making urine  Interim History / Subjective:  Dropping sedation Objective   Blood pressure (!) 133/58, pulse 76, temperature 97.7 F (36.5 C), temperature source Esophageal, resp. rate 14, height 5\' 11"  (1.803 m), weight 105.4 kg, SpO2 100%. CVP:  [0 mmHg-14 mmHg] 3 mmHg  Vent Mode: PRVC FiO2 (%):  [40 %] 40 % Set Rate:  [15 bmp] 15 bmp Vt Set:  [600 mL] 600 mL PEEP:  [5 cmH20] 5 cmH20 Plateau Pressure:  [11 cmH20-20 cmH20] 11 cmH20   Intake/Output Summary (Last 24 hours) at 12/26/2022 0910 Last data filed at 12/26/2022 0800 Gross per 24 hour  Intake 3893.56 ml  Output 2475 ml  Net 1418.56 ml   Filed Weights   12/22/22 0615 12/25/22 0500 12/26/22 0308  Weight: 96.7 kg 106.4 kg 105.4 kg    Examination:  General 67 year old male patient currently sedated on vent HEENT normocephalic atraumatic no jugular venous distention orally intubated jugular central lines are unremarkable Pulmonary: Clear to auscultation Cardiac: Regular rate and rhythm Abdomen: Soft, bowel sounds absent.  Wound VAC in place Extremities: Warm dry brisk cap refill trace edema Neuro: Heavily sedated.  Resolved Hospital Problem list     Assessment & Plan:   Acute mesenteric ischemia: Status post small bowel  resection and superior mesenteric artery stent placement 9/12.  Open abdomen, left in discontinuity since 9/12, another 66 cm of jejunum was resected on 9/16 prior to successful reanastomosis of the small bowel Plan NPO (no feeds or meds) TPN argatroban gtt per pharmacy   Wound care as  directed by surgical team wet to dry dressings Day #6 Zosyn As needed pain management  Postoperative ventilator management-  Now that his bowel has been reanastomosed we can proceed with weaning  plan Changing RASS goal to 0 to -1 VAP bundle Daily assessment for SBT, and pressure support as tolerated A.m. chest x-ray  Intermittent tachycardia and hypertension. There is some discrepancy between cuff and arterial line Plan Remove arterial line Ensure pain treated Orders placed for as needed metoprolol  AKI on CKD3: in context of mesenteric ischemia, fluid shifts and likely ATN.   His BUN continues to climb acidosis creatinine Plan Continue strict intake and output No urgent need for dialysis however still may need renal replacement therapy if serum creatinine and BUN continue to rise Avoid hypotension Renal dose medications Strict intake output Will keep current HD catheter in place in case he needs dialysis  Hyperglycemia Plan Continue every 4 hour sensitive sliding scale insulin Goal glucose 140-100  Postop anemia- expected in context of blood losses from surgery and bone marrow stunning in context of critical illness, hgb stable  Plan Trend cbc Trigger for transfusion < 7   Protein calorie malnutrition : significant weight loss prior to this admission, now NPO Plan TPN   Sacral decub/deep tissue wound. Still not at place where can be staged Plan WOC consult placed   Hx HTN, cataracts, GERD, HLD Plan Holding Home Catapres, chlorthalidone, Cozaar and Crestor, can resume these when we are clear to use gut   Best Practice (right click and "Reselect all SmartList Selections" daily)   Diet/type: TPN DVT prophylaxis: heparin gtt GI prophylaxis: PPI Lines: Arterial Line Foley:  Yes, and it is still needed Code Status:  full code Last date of multidisciplinary goals of care discussion [updated wife at bedside 9/14]   My time 50 minutes Simonne Martinet  ACNP-BC [facility] Pulmonary/Critical Care Pager # [phone] OR # [phone] if no answer

## 2022-12-26 NOTE — Progress Notes (Signed)
ANTICOAGULATION CONSULT NOTE - Follow Up  Pharmacy Consult for heparin >> argatroban Indication: SMA stenosis/occlusion  Allergies  Allergen Reactions   Zestril [Lisinopril] Cough    Patient Measurements: Height: 5\' 11"  (180.3 cm) Weight: 105.4 kg (232 lb 5.8 oz) IBW/kg (Calculated) : 75.3 Heparin Dosing Weight: 94.6kg  Vital Signs: Temp: 98.2 F (36.8 C) (09/17 1200) Temp Source: Esophageal (09/17 0800) BP: 152/77 (09/17 1200) Pulse Rate: 81 (09/17 1200)  Labs: Recent Labs    12/24/22 1900 12/24/22 1937 12/25/22 0000 12/25/22 0345 12/25/22 0937 12/25/22 1523 12/26/22 0014 12/26/22 0429 12/26/22 1210  HGB  --   --   --  9.4*  --  10.5*  --  9.4*  --   HCT  --   --   --  29.0*  --  34.9*  --  30.3*  --   PLT  --   --   --  152  --  192  --  149*  --   APTT  --   --   --   --   --   --  63* 74* 80*  HEPARINUNFRC <0.10*  --  <0.10*  --  <0.10*  --   --   --   --   CREATININE  --  6.87*  --  6.97*  --   --   --  7.10*  --     Estimated Creatinine Clearance: 12.3 mL/min (A) (by C-G formula based on SCr of 7.1 mg/dL (H)).   Assessment: 55 YOM presenting with abdominal pain found to have mesenteric ischemia. Pharmacy consulted for IV heparin. No AC PTA. Pt s/p ex lap, bowel resection, thrombectomy, and angioplasty 9/12, heparin resumed postop.  Despite multiple rate increases, unable to get heparin to a detectable level.  No issues with IV infusions.  ?AT3 deficiency?  Discussed with surgery and CCM, patient transitioned to direct thrombin inhibitor argatroban (preferred over bivalirudin in renal dysfunction).  Aptt this afternoon is slightly above reduced goal range, no overt bleeding or complications noted.  CBC stable.  Goal of Therapy:  aPTT 50-90 seconds (Aim for 50-70 seconds given recent OR admission) Monitor platelets by anticoagulation protocol: Yes   Plan:  Reduce argatroban to 0.3 mcg/kg/min  Check repeat aPTT in 4 hrs  Daily CBC and aPTT.  Reece Leader, Loura Back, BCPS, BCCP Clinical Pharmacist  12/26/2022 1:25 PM   [facility] pharmacy phone numbers are listed on [email or web address]

## 2022-12-26 NOTE — Progress Notes (Signed)
eLink Physician-Brief Progress Note Patient Name: [name] [name] DOB: December 29, 1955 MRN: [id number]   Date of Service  12/26/2022  HPI/Events of Note  Notified of patient developing a fever. Last temp 100 via esophageal probe. Apply ice packs under arms.   Received request to order PRN IV Tylenol. Patient is completely NPO due to GI issues. LFTs were elevated but are trending down. AST 79, ALT 32 on yesterday labs  eICU Interventions  IV acetaminophen prn ordered for temp > 101 for 2 doses     Intervention Category Minor Interventions: Routine modifications to care plan (e.g. PRN medications for pain, fever)  Rosalie Gums Tametra Ahart 12/26/2022, 10:08 PM

## 2022-12-26 NOTE — Progress Notes (Addendum)
  Progress Note    12/26/2022 7:52 AM 1 Day Post-Op  Subjective:  intubated and sedated    Vitals:   12/26/22 0600 12/26/22 0700  BP: (!) 109/55 (!) 107/58  Pulse: 74 74  Resp: (!) 30 15  Temp: 97.7 F (36.5 C) 97.7 F (36.5 C)  SpO2: 100% 100%    Physical Exam: General:  intubated and with NGT Cardiac:  regular Lungs:  mechanically ventilated Extremities:  bilateral DP doppler signals intact Abdomen:  soft with WTD dressing in place  CBC    Component Value Date/Time   WBC 14.6 (H) 12/26/2022 0429   RBC 3.78 (L) 12/26/2022 0429   HGB 9.4 (L) 12/26/2022 0429   HCT 30.3 (L) 12/26/2022 0429   PLT 149 (L) 12/26/2022 0429   MCV 80.2 12/26/2022 0429   MCH 24.9 (L) 12/26/2022 0429   MCHC 31.0 12/26/2022 0429   RDW 14.6 12/26/2022 0429   LYMPHSABS 0.9 10/30/2022 1654   MONOABS 0.6 10/30/2022 1654   EOSABS 0.3 10/30/2022 1654   BASOSABS 0.0 10/30/2022 1654    BMET    Component Value Date/Time   NA 135 12/26/2022 0429   K 4.1 12/26/2022 0429   CL 102 12/26/2022 0429   CO2 21 (L) 12/26/2022 0429   GLUCOSE 153 (H) 12/26/2022 0429   BUN 86 (H) 12/26/2022 0429   CREATININE 7.10 (H) 12/26/2022 0429   CALCIUM 8.0 (L) 12/26/2022 0429   GFRNONAA 8 (L) 12/26/2022 0429   GFRAA >60 04/14/2015 1127    INR No results found for: "INR"   Intake/Output Summary (Last 24 hours) at 12/26/2022 0752 Last data filed at 12/26/2022 0616 Gross per 24 hour  Intake 3978.12 ml  Output 2490 ml  Net 1488.12 ml      Assessment/Plan:  67 y.o. male is s/p: ex lap with SMA stenting and small bowel resection  POD1- repeat ex lap with further small bowel resection   -Doint okay overnight after takeback to OR yesterday. Approx 66cm of jejunum resected and anastomosed yesterday. Fascia was then closed and he was transitioned to abdominal WTD dressing -Mechanically ventilated and sedated this morning. Attempting to wean vent and sedation as tolerated -Abdominal dressing intact and  dry. Dressing changes per general surgery -Seemingly resistant/non responsive to heparin. Transitioned to Argatroban yesterday with pharmacy assistance -Hemodynamically stable with Hgb at 9.4 -Unfortunately Scr continues to rise, currently at 7.1. Not requiring dialysis yet -On aspirin. Potentially can start plavix for stent patency if ok with general  -BLE well perfused with DP doppler signals   Loel Dubonnet PA-C [facility] [phone] 12/26/2022 7:52 AM  VASCULAR STAFF ADDENDUM: I have independently interviewed and examined the patient. I agree with the above.  Palpable Dps Abd closed.  Minimal vent settings.  Appreciate Crit care involvement.  Wean to extubation.  Continue TPN.  Continue argatroban Continues to make urine. BUN increasing.  Hopeful for kidney improvement. Consider nephrology involvement for possible CRRT.      Victorino Sparrow MD [facility] Phone Number: [phone] 12/26/2022 9:34 AM

## 2022-12-26 NOTE — Anesthesia Postprocedure Evaluation (Signed)
Anesthesia Post Note  Patient: ARYE ESPEJO  Procedure(s) Performed: RE-EXPLORATION LAPAROTOMY W/CLOSURE (Abdomen) SMALL BOWEL RESECTION W/ANASTOMOSIS X2 (Abdomen)     Patient location during evaluation: SICU Anesthesia Type: General Level of consciousness: sedated Pain management: pain level controlled Vital Signs Assessment: post-procedure vital signs reviewed and stable Respiratory status: patient remains intubated per anesthesia plan Cardiovascular status: stable Postop Assessment: no apparent nausea or vomiting Anesthetic complications: no   No notable events documented.  Last Vitals:  Vitals:   12/26/22 0700 12/26/22 0800  BP: (!) 107/58 (!) 133/58  Pulse: 74 76  Resp: 15 14  Temp: 36.5 C 36.5 C  SpO2: 100% 100%    Last Pain:  Vitals:   12/26/22 0800  TempSrc: Esophageal  PainSc:                  Hedwig Mcfall

## 2022-12-26 NOTE — Progress Notes (Signed)
ANTICOAGULATION CONSULT NOTE - Follow Up  Pharmacy Consult for heparin >> argatroban Indication: SMA stenosis/occlusion  Allergies  Allergen Reactions   Zestril [Lisinopril] Cough    Patient Measurements: Height: 5\' 11"  (180.3 cm) Weight: 105.4 kg (232 lb 5.8 oz) IBW/kg (Calculated) : 75.3 Heparin Dosing Weight: 94.6kg  Vital Signs: Temp: 100.2 F (37.9 C) (09/17 1900) Temp Source: Esophageal (09/17 1600) BP: 121/61 (09/17 1900) Pulse Rate: 108 (09/17 1900)  Labs: Recent Labs    12/24/22 1900 12/24/22 1937 12/25/22 0000 12/25/22 0345 12/25/22 0937 12/25/22 1523 12/26/22 0014 12/26/22 0429 12/26/22 1210 12/26/22 1825  HGB  --   --   --  9.4*  --  10.5*  --  9.4*  --   --   HCT  --   --   --  29.0*  --  34.9*  --  30.3*  --   --   PLT  --   --   --  152  --  192  --  149*  --   --   APTT  --   --   --   --   --   --    < > 74* 80* 77*  HEPARINUNFRC <0.10*  --  <0.10*  --  <0.10*  --   --   --   --   --   CREATININE  --  6.87*  --  6.97*  --   --   --  7.10*  --   --    < > = values in this interval not displayed.    Estimated Creatinine Clearance: 12.3 mL/min (A) (by C-G formula based on SCr of 7.1 mg/dL (H)).   Assessment: 46 YOM presenting with abdominal pain found to have mesenteric ischemia. Pharmacy consulted for IV heparin. No AC PTA. Pt s/p ex lap, bowel resection, thrombectomy, and angioplasty 9/12, heparin resumed postop.  Despite multiple rate increases, unable to get heparin to a detectable level, so patient was transitioned to direct thrombin inhibitor argatroban.  aPTT remains supra-therapeutic at 77 sec.  No bleeding reported  Goal of Therapy:  aPTT 50-90 seconds (Aim for 50-70 seconds given recent OR admission) Monitor platelets by anticoagulation protocol: Yes   Plan:  Reduce argatroban to 0.22 mcg/kg/min  Check repeat aPTT in 4 hrs  Daily CBC and aPTT  Quinterius Gaida D. Laney Potash, PharmD, BCPS, BCCCP 12/26/2022, 7:26 PM

## 2022-12-26 NOTE — Plan of Care (Signed)
Pt remains intubated.

## 2022-12-26 NOTE — Progress Notes (Signed)
Pharmacy Antibiotic Note  [name] [name] is a 67 y.o. male admitted on 12/20/2022 with open abdomen in discontinuity.  Pharmacy has been consulted for zosyn dosing. Patient underwent SMA stent and found to have necrotic area of the jejunum.   WBC  7.6>14.6 , afebrile. Scr continues to increase now to 7.1 (CrCl 12 mL/min). Went back to OR today for evaluation of SMA (left in discontinuity but small bowel noted not to be frankly necrotic) with plan for exploration on Monday.   Plan: Zosyn 2.25 gm IV every 8 hours - if plan for CRRT will adjust to 3.375 g IV every 6 hours Monitor renal function Follow up LOT, closing of abdomen, signs of clinical improvement  Height: 5\' 11"  (180.3 cm) Weight: 105.4 kg (232 lb 5.8 oz) IBW/kg (Calculated) : 75.3  Temp (24hrs), Avg:98.6 F (37 C), Min:97.7 F (36.5 C), Max:99.5 F (37.5 C)  Recent Labs  Lab 12/23/22 0516 12/24/22 0500 12/24/22 1937 12/25/22 0345 12/25/22 1523 12/26/22 0429  WBC 7.6 7.6  --  6.7 12.0* 14.6*  CREATININE 4.99* 6.02* 6.87* 6.97*  --  7.10*    Estimated Creatinine Clearance: 12.3 mL/min (A) (by C-G formula based on SCr of 7.1 mg/dL (H)).    Allergies  Allergen Reactions   Zestril [Lisinopril] Cough    Antimicrobials this admission: Zosyn 9/13 >>   Thank you for allowing pharmacy to participate in this patient's care,  Reece Leader, Colon Flattery, [facility] Clinical Pharmacist  12/26/2022 1:31 PM   [facility] pharmacy phone numbers are listed on [email or web address]

## 2022-12-26 NOTE — Progress Notes (Signed)
Afternoon eval:   Sedation discontinued earlier this am.  Has received small dose fent for PRN pain  Currently   Resting comfortably on PSV HENT NCAT no jvd. Orally intubated Pulm cl. No accessory use on PSV. VTs 600s rr 20s looks comfortable but sedated Card rrr Abd unchanged Neuro. Still hard to arouse. Grimaced w/ painful stim only. Would not open eyes or f/c  Imp/plan S/p lap w/ resxn and re-anastomosis for ischemic bowel from SMA occulsion Vent management  Acute on chronic renal failure HTN Tachycardia   Impression Mental status primary barrier to extubation. Has been off sedation since early am rounds and only received 1 dose of fent. Hope w/ time MS will cont to improve but w/ his renal failure may need iHD in order to get to that point  Plan Cont PSV as tolerated during day w/ night time rest Minimize sedation Re-assess for SBT in am again but may need to consider iHD if still not awake enough to extubate   Simonne Martinet ACNP-BC [facility] Pulmonary/Critical Care Pager # [phone] OR # [phone] if no answer

## 2022-12-26 NOTE — Progress Notes (Signed)
ANTICOAGULATION CONSULT NOTE - Follow Up  Pharmacy Consult for heparin >> argatroban Indication: SMA stenosis/occlusion  Allergies  Allergen Reactions   Zestril [Lisinopril] Cough    Patient Measurements: Height: 5\' 11"  (180.3 cm) Weight: 106.4 kg (234 lb 9.1 oz) IBW/kg (Calculated) : 75.3 Heparin Dosing Weight: 94.6kg  Vital Signs: Temp: 99.1 F (37.3 C) (09/17 0000) Temp Source: Esophageal (09/17 0000) BP: 122/64 (09/17 0000) Pulse Rate: 85 (09/17 0000)  Labs: Recent Labs    12/24/22 0500 12/24/22 0806 12/24/22 1900 12/24/22 1937 12/25/22 0000 12/25/22 0345 12/25/22 0937 12/25/22 1523 12/26/22 0014  HGB 9.2*  --   --   --   --  9.4*  --  10.5*  --   HCT 28.9*  --   --   --   --  29.0*  --  34.9*  --   PLT 176  --   --   --   --  152  --  192  --   APTT  --   --   --   --   --   --   --   --  63*  HEPARINUNFRC  --    < > <0.10*  --  <0.10*  --  <0.10*  --   --   CREATININE 6.02*  --   --  6.87*  --  6.97*  --   --   --    < > = values in this interval not displayed.    Estimated Creatinine Clearance: 12.6 mL/min (A) (by C-G formula based on SCr of 6.97 mg/dL (H)).   Assessment: 29 YOM presenting with abdominal pain found to have mesenteric ischemia. Pharmacy consulted for IV heparin. No AC PTA. Pt s/p ex lap, bowel resection, thrombectomy, and angioplasty 9/12, heparin resumed postop.  Despite multiple rate increases, unable to get heparin to a detectable level.  No issues with IV infusions.  ?AT3 deficiency?  Discussed with surgery and CCM, will transition patient to direct thrombin inhibitor argatroban (preferred over bivalirudin in renal dysfunction).  9/17 AM: aPTT returned at 63 seconds on 0.5 mcg/kg/min of argatroban (therapeutic). Per RN, no issues with infusion running continuously or signs/symptoms of bleeding. Last CBC shows increase in Hgb from 9.4 > 10.5 and plts WNL.  Goal of Therapy:  aPTT 50-90 seconds (Aim for 50-70 seconds given recent OR  admission) Monitor platelets by anticoagulation protocol: Yes   Plan:  Continue argatroban at 0.5 mcg/kg/min  Check repeat aPTT in 4 hrs  Follow up goal for aPTT goal Daily CBC and aPTT.  Arabella Merles, PharmD. Clinical Pharmacist 12/26/2022 12:38 AM

## 2022-12-26 NOTE — Progress Notes (Signed)
eLink Physician-Brief Progress Note Patient Name: CHRISTHOPHER CIMORELLI DOB: 1956-03-27 MRN: [id number]   Date of Service  12/26/2022  HPI/Events of Note  Notified of BP elevation Current BP 169/76  HR 128 Seen adequately sedated  eICU Interventions  Ordered labetalol prn He has history of hypertension and has clonidine as home medication. Consider resuming if BP persistently elevated     Intervention Category Intermediate Interventions: Hypertension - evaluation and management  Darl Pikes 12/26/2022, 11:34 PM

## 2022-12-26 NOTE — Progress Notes (Signed)
[facility] NEPHROLOGY PROGRESS NOTE  Assessment/ Plan: Pt is a 67 y.o. 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   # Acute kidney injury on CKD IIIb: Baseline creatinine level around 2.0.  Acute kidney injury likely ischemic ATN due to ex lap surgery/IV contrast/hypotension with hemodynamic changes. -remains non-oliguric. Has responded to lasix over the weekend.  -Cr continues to worsen but suspecting this is related to surgery yesterday. No urgent indication for RRT at this time however this can very well change in the next day or two if his Cr does not turn around. Would also consider HD if sedation is being weaned but he is not having any adequate response. Will hold off on fluids since he is receiving TPN.  -It has been discussed with the patient's wife on 9/14 for possible dialysis need. -Strict ins and out and close lab monitoring.   # Acute mesenteric ischemia: Status post small bowel resection and superior mesenteric artery stent placement.  Followed by vascular surgeon and currently on agatroban since there was inadequate response to heparin. S/p OR again 9/16 for exlap and further small bowel resection, abd closed   # Vent dependent respiratory failure: Per PCCM.   # Anemia likely due to blood loss: Monitor CBC and transfuse as as per ICU's discretion.   # History of hypertension: BP is currently acceptable  Anthony Sar, MD [facility]  Subjective: Seen and examined at bedside.  Patient remains intubated and sedated.  No acute events overnight. UOP ~2L Objective Vital signs in last 24 hours: Vitals:   12/26/22 0400 12/26/22 0500 12/26/22 0600 12/26/22 0700  BP:   (!) 109/55 (!) 107/58  Pulse: 79 76 74 74   Resp: 13 15 (!) 30 15  Temp: 98.2 F (36.8 C) 97.7 F (36.5 C) 97.7 F (36.5 C) 97.7 F (36.5 C)  TempSrc: Esophageal     SpO2: 100% 100% 100% 100%  Weight:      Height:       Weight change: -1 kg  Intake/Output Summary (Last 24 hours) at 12/26/2022 0806 Last data filed at 12/26/2022 0616 Gross per 24 hour  Intake 3847.03 ml  Output 2425 ml  Net 1422.03 ml       Labs: RENAL PANEL Recent Labs  Lab 12/20/22 1441 12/20/22 1441 12/20/22 2318 12/21/22 1452 12/21/22 2228 12/21/22 2229 12/22/22 0458 12/22/22 0501 12/23/22 0516 12/24/22 0500 12/24/22 1937 12/25/22 0345 12/26/22 0429  NA 138  --  138   < > 136   < > 135   < > 135 136 136 137 135  K 3.8  --  3.2*   < > 3.2*   < > 3.8   < > 3.8 3.7 3.6 3.7 4.1  CL 99  --  100   < > 101  --  103  --  103 102 100 105 102  CO2 29  --  27   < > 21*  --  22  --  22 21* 21* 22 21*  GLUCOSE 126*  --  134*   < > 128*  --  141*  --  125* 126* 127* 123* 153*  BUN 36*  --  37*   < > 34*  --  36*  --  46* 57* 66* 72*  86*  CREATININE 3.00*  --  2.90*   < > 2.90*  --  3.32*  --  4.99* 6.02* 6.87* 6.97* 7.10*  CALCIUM 8.6*  --  8.6*   < > 7.7*  --  8.0*  --  7.5* 7.6* 7.8* 7.9* 8.0*  MG  --    < > 1.6*  --  1.4*  --  2.0  --  1.9 2.0  --  2.2 2.0  PHOS  --   --   --   --  4.2  --   --   --  5.3* 5.8*  --  6.0* 6.0*  ALBUMIN 3.1*  --  2.8*  --   --   --  2.3*  --   --   --  1.8* 1.8*  --    < > = values in this interval not displayed.    Liver Function Tests: Recent Labs  Lab 12/22/22 0458 12/24/22 1937 12/25/22 0345  AST 45* 72* 79*  ALT 69* 29 32  ALKPHOS 57 38 45  BILITOT 1.1 0.6 0.7  PROT 5.6* 4.6* 5.0*  ALBUMIN 2.3* 1.8* 1.8*   No results for input(s): "LIPASE", "AMYLASE" in the last 168 hours. No results for input(s): "AMMONIA" in the last 168 hours. CBC: Recent Labs    12/23/22 0516 12/24/22 0500 12/25/22 0345 12/25/22 1523 12/26/22 0429  HGB 9.6* 9.2* 9.4* 10.5* 9.4*  MCV 80.3 79.6* 80.3 81.5 80.2     Cardiac Enzymes: No results for input(s): "CKTOTAL", "CKMB", "CKMBINDEX", "TROPONINI" in the last 168 hours. CBG: Recent Labs  Lab 12/25/22 1719 12/25/22 2011 12/25/22 2335 12/26/22 0309 12/26/22 0759  GLUCAP 130* 133* 160* 154* 105*    Iron Studies: No results for input(s): "IRON", "TIBC", "TRANSFERRIN", "FERRITIN" in the last 72 hours. Studies/Results: PERIPHERAL VASCULAR CATHETERIZATION  Result Date: 12/25/2022 See surgical note for result.   Medications: Infusions:  argatroban 0.4 mcg/kg/min (12/26/22 0735)   fentaNYL infusion INTRAVENOUS 200 mcg/hr (12/26/22 0616)   midazolam 2 mg/hr (12/26/22 0616)   piperacillin-tazobactam (ZOSYN)  IV Stopped (12/26/22 0420)   propofol (DIPRIVAN) infusion Stopped (12/25/22 1103)   TPN ADULT (ION) 85 mL/hr at 12/26/22 0616    Scheduled Medications:  aspirin  300 mg Rectal Daily   Chlorhexidine Gluconate Cloth  6 each Topical Daily   docusate  100 mg Per Tube BID   insulin aspart  0-9 Units Subcutaneous Q4H   mouth rinse  15 mL Mouth Rinse Q2H   pantoprazole (PROTONIX) IV  40 mg Intravenous QHS   polyethylene glycol  17 g Per Tube Daily   sodium chloride flush  3 mL Intravenous Q12H    have reviewed scheduled and prn medications.  Physical Exam: General: Intubated and sedated. Heart:RRR, s1s2 nl Lungs: cta bilateral. intubated Abdomen: dressings in place, +wound vac Extremities:No edema Neurology: Sedated Dialysis access: RIJ temp HD catheter  Suhana Wilner 12/26/2022,8:06 AM  LOS: 6 days

## 2022-12-26 NOTE — Progress Notes (Addendum)
[facility] Progress Note  1 Day Post-Op  Subjective: CC:  Intubated. RN decreasing sedation   Afebrile, WBC 14 Objective: Vital signs in last 24 hours: Temp:  [97.7 F (36.5 C)-99.7 F (37.6 C)] 97.7 F (36.5 C) (09/17 0800) Pulse Rate:  [70-126] 76 (09/17 0800) Resp:  [13-30] 14 (09/17 0800) BP: (104-152)/(49-78) 133/58 (09/17 0800) SpO2:  [94 %-100 %] 100 % (09/17 0800) Arterial Line BP: (92-193)/(39-80) 162/52 (09/17 0800) FiO2 (%):  [40 %] 40 % (09/17 0817) Weight:  [105.4 kg] 105.4 kg (09/17 0308) Last BM Date : 12/17/22  Intake/Output from previous day: 09/16 0701 - 09/17 0700 In: 3978.1 [I.V.:2727.7; IV Piggyback:1250.4] Out: 2490 [Urine:2060; Emesis/NG output:350; Drains:30; Blood:50] Intake/Output this shift: Total I/O In: 281.8 [I.V.:281.8] Out: 100 [Urine:100]  PE: Gen:  intubated, sedated Card:  Regular rate and rhythm Pulm:  ventilated respirations Abd: Soft, mild distention, sedation was lightened this morning - he seems to respond to pain during dressing change. Midline wound c/d/I, new moist-to-dry dressing placed by me. No bowel sounds  GU: Clear yellow urine in foley bag, UOP 2,060 mL Skin: warm and dry Psych:unable to assess  Lab Results:  Recent Labs    12/25/22 1523 12/26/22 0429  WBC 12.0* 14.6*  HGB 10.5* 9.4*  HCT 34.9* 30.3*  PLT 192 149*   BMET Recent Labs    12/25/22 0345 12/26/22 0429  NA 137 135  K 3.7 4.1  CL 105 102  CO2 22 21*  GLUCOSE 123* 153*  BUN 72* 86*  CREATININE 6.97* 7.10*  CALCIUM 7.9* 8.0*   PT/INR No results for input(s): "LABPROT", "INR" in the last 72 hours. CMP     Component Value Date/Time   NA 135 12/26/2022 0429   K 4.1 12/26/2022 0429   CL 102 12/26/2022 0429   CO2 21 (L) 12/26/2022 0429   GLUCOSE 153 (H) 12/26/2022 0429   BUN 86 (H) 12/26/2022 0429   CREATININE 7.10 (H) 12/26/2022 0429   CALCIUM 8.0 (L) 12/26/2022 0429   PROT 5.0 (L) 12/25/2022 0345   PROT 6.3 03/16/2020  0800   ALBUMIN 1.8 (L) 12/25/2022 0345   ALBUMIN 4.3 03/16/2020 0800   AST 79 (H) 12/25/2022 0345   ALT 32 12/25/2022 0345   ALKPHOS 45 12/25/2022 0345   BILITOT 0.7 12/25/2022 0345   BILITOT 0.8 03/16/2020 0800   GFRNONAA 8 (L) 12/26/2022 0429   GFRAA >60 04/14/2015 1127   Lipase     Component Value Date/Time   LIPASE 28 11/27/2022 0755       Studies/Results: PERIPHERAL VASCULAR CATHETERIZATION  Result Date: 12/25/2022 See surgical note for result.   Anti-infectives: Anti-infectives (From admission, onward)    Start     Dose/Rate Route Frequency Ordered Stop   12/23/22 1230  piperacillin-tazobactam (ZOSYN) IVPB 2.25 g        2.25 g 100 mL/hr over 30 Minutes Intravenous Every 8 hours 12/23/22 1139     12/22/22 0000  piperacillin-tazobactam (ZOSYN) IVPB 3.375 g  Status:  Discontinued        3.375 g 12.5 mL/hr over 240 Minutes Intravenous Every 8 hours 12/21/22 2331 12/23/22 1139        Assessment/Plan 67 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 Karin Lieu, exploratory laparotomy and small bowel resection Dr. Janee Morn S/p re-exploration, replace abthera FB 12/23/22   S/p re-exploration, small bowel resection with anastomosis x2, assessment of bowel perfusion with ICG, abdominal closure 9/16 Dr.  Wilson - POD#1 - afebrile, VSS, WBC 14 - continue NG to LIWS and await bowel function, would avoid meds per tube for now, as I suspect he has poor intestinal absorption of meds this early post-op. There is no strict contra-indication for necessary meds per tube, but would reserve for meds that have no IV or rectal formulation. - remains on rectal ASA and hep gtt per vascular surgery - continue Zosyn, would continue for 3 days, then ok to stop from CCS standpoint.  - TPN per pharmacy - CCS will follow closely    LOS: 6 days   I reviewed nursing notes, Consultant nephrology, vascular notes, hospitalist notes, last 24 h  vitals and pain scores, last 48 h intake and output, last 24 h labs and trends, and last 24 h imaging results.    Hosie Spangle, PA-C [facility] Please see Amion for pager number during day hours 7:00am-4:30pm

## 2022-12-26 NOTE — Progress Notes (Signed)
   12/26/22 1145  Adult Ventilator Settings  Vent Type Servo i  Humidity HME  Vent Mode PSV;CPAP  FiO2 (%) 40 %  I Time 0.9 Sec(s)  Pressure Support 5 cmH20  PEEP 5 cmH20  Adult Ventilator Measurements  Peak Airway Pressure 11 L/min  Mean Airway Pressure 7 cmH20  Resp Rate Total 16 br/min  Exhaled Vt 654 mL  Measured Ve 10.3 L  Auto PEEP 0 cmH20  Total PEEP 5 cmH20  Adult Ventilator Alarms  Alarms On Y  Ve High Alarm 21 L/min  Ve Low Alarm 4 L/min  Resp Rate High Alarm 38 br/min  Resp Rate Low Alarm 8  PEEP Low Alarm 2 cmH2O  Press High Alarm 45 cmH2O  T Apnea 20 sec(s)   Placed pt on wean mode per md order 5/5 PS RN aware

## 2022-12-26 NOTE — Progress Notes (Signed)
Bilateral renal arteries with flow. Right kidney atretic. Cortex echogenic. In speaking with nephrology, no role for revascularization in an effort to improve creatinine.   Victorino Sparrow MD

## 2022-12-26 NOTE — TOC Initial Note (Signed)
Transition of Care [facility]) - Initial/Assessment Note    Patient Details  Name: [name] [name] MRN: [id number] Date of Birth: 09-02-1955  Transition of [facility]) CM/SW Contact:    Gala Lewandowsky, RN Phone Number: 12/26/2022, 11:42 AM  Clinical Narrative:  Risk for readmission assessment completed. Patient was dicussed in progression rounds-post re-exploration small bowel resection. Patient currently intubated-spouse at the bedside. PTA patient was independent from home with spouse. Spouse states patient played tennis 3 x week. Case Manager will continue to follow for transition of care needs as the patient progresses.                    Expected Discharge Plan: Home/Self Care Barriers to Discharge: Continued Medical Work up  Expected Discharge Plan and Services In-house Referral: NA Discharge Planning Services: CM Consult Post Acute Care Choice: NA Living arrangements for the past 2 months: Single Family Home    Prior Living Arrangements/Services Living arrangements for the past 2 months: Single Family Home Lives with:: Spouse Patient language and need for interpreter reviewed:: Yes Do you feel safe going back to the place where you live?: Yes      Need for Family Participation in Patient Care: Yes (Comment) Care giver support system in place?: Yes (comment)   Criminal Activity/Legal Involvement Pertinent to Current Situation/Hospitalization: No - Comment as needed  Activities of Daily Living Home Assistive Devices/Equipment: Blood pressure cuff, Grab bars in shower, Hand-held shower hose, Reacher, Scales, Stimulators (Tens unit) ADL Screening (condition at time of admission) Patient's cognitive ability adequate to safely complete daily activities?: Yes Is the patient deaf or have difficulty hearing?: No Does the patient have difficulty seeing, even when wearing glasses/contacts?: No Does the patient have difficulty concentrating, remembering, or making decisions?:  No Patient able to express need for assistance with ADLs?: Yes Does the patient have difficulty dressing or bathing?: No Independently performs ADLs?: Yes (appropriate for developmental age) Does the patient have difficulty walking or climbing stairs?: No Weakness of Legs: None Weakness of Arms/Hands: None  Permission Sought/Granted Permission sought to share information with : Case Manager, Family Supports     Emotional Assessment Appearance:: Appears stated age  Alcohol / Substance Use: Not Applicable Psych Involvement: No (comment)  Admission diagnosis:  Elevated serum creatinine [R79.89] Superior mesenteric artery stenosis (HCC) [K55.1] Mesenteric ischemia (HCC) [K55.9] Patient Active Problem List   Diagnosis Date Noted   Protein-calorie malnutrition, severe 12/25/2022   Hypoalbuminemia 12/21/2022   GERD (gastroesophageal reflux disease) 12/21/2022   Acute renal failure superimposed on stage 3a chronic kidney disease (HCC) 12/21/2022   Mesenteric ischemia (HCC) 12/21/2022   Endotracheal tube present 12/21/2022   Superior mesenteric artery stenosis (HCC) 12/20/2022   CKD (chronic kidney disease) stage 3, GFR 30-59 ml/min (HCC) 09/11/2022   GIST (gastrointestinal stromal tumor) of small bowel, malignant (HCC) 07/26/2021   Acute GI bleeding 04/01/2021   Acute blood loss anemia 04/01/2021   Right hip impingement syndrome 08/18/2019   Aortic atherosclerosis (HCC) 07/10/2019   Elevated hemoglobin (HCC) 09/13/2017   Rhegmatogenous retinal detachment of right eye 06/16/2014   Hyperlipidemia 10/01/2007   Essential hypertension 10/01/2007   NEOPLASM, MALIGNANT, TESTES, HX OF 10/01/2007   COLONIC POLYPS, HX OF 10/01/2007   NEPHROLITHIASIS, HX OF 10/01/2007   PCP:  [name], MD Pharmacy:   [phone] [place] [place] - [address], [address] AT [facility] [address], [address] [place] [address] Phone: [phone] Fax:  [phone]  Social Determinants of Health (SDOH) Social History: SDOH Screenings   Food Insecurity: No Food Insecurity (12/20/2022)  Housing: Low Risk  (12/20/2022)  Transportation Needs: No Transportation Needs (12/20/2022)  Utilities: Not At Risk (12/20/2022)  Alcohol Screen: Low Risk  (11/20/2022)  Depression (PHQ2-9): Low Risk  (11/20/2022)  Financial Resource Strain: Low Risk  (12/01/2022)  Physical Activity: Sufficiently Active (12/01/2022)  Social Connections: Moderately Integrated (12/01/2022)  Stress: No Stress Concern Present (12/01/2022)  Tobacco Use: Low Risk  (12/21/2022)  Health Literacy: Adequate Health Literacy (11/20/2022)   Readmission Risk Interventions    12/26/2022   11:40 AM  Readmission Risk Prevention Plan  Transportation Screening Complete  HRI or Home Care Consult Complete  Social Work Consult for Recovery Care Planning/Counseling Complete  Palliative Care Screening Not Applicable  Medication Review Oceanographer) Complete

## 2022-12-26 NOTE — Consult Note (Addendum)
WOC Nurse Consult Note: Reason for Consult: Consult requested for bilat buttocks.  Pt is critically ill with multiple systemic factors which can impair healing.   Wound type: Bilat buttocks with dark red-purple Deep tissue pressure injuries.  These are beginning to evolve and peel from fluid filled blisters to red moist partial thickness skin loss.  Total area is approx 3X7cm, left is approx 5X2cm and right is 3X1cm, separated by narrow bridge if intact skin.  Pressure Injury POA: No Dressing procedure/placement/frequency: Pt is on a low airloss mattress to reduce pressure. Topical treatment orders provided for bedside nurses to perform as follows to promote moist healing: Apply Xeroform gauze to bilat buttocks wounds Q day and cover with foam dressing. Change foam dressing Q 3 days or PRN soiling.  WOC team will assess weekly to determine if a change in the plan of care is indicated at that time.  Thank-you,  Cammie Mcgee MSN, RN, CWOCN, [place], CNS [phone]

## 2022-12-26 NOTE — Progress Notes (Signed)
PHARMACY - TOTAL PARENTERAL NUTRITION CONSULT NOTE  Indication:  SMA syndrome w associated weight loss  Patient Measurements: Height: 5\' 11"  (180.3 cm) Weight: 105.4 kg (232 lb 5.8 oz) IBW/kg (Calculated) : 75.3 TPN AdjBW (KG): 80.4 Body mass index is 32.41 kg/m. Usual Weight: 95 kg, has lost 50lbs recently   Assessment:  67 yo M admitted with abdominal pain, weight loss, and poor PO intake. 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 He is now s/p SMA stent placement. He is currently left is discontinuity w open abdomen.  Glucose / Insulin: A1c 5.3 - CBGs < 180 Used 4 units SSI in the past 24 hrs Electrolytes: all WNL except elevated Phos at 6 (no Phos in TPN, CoCa 9.8. Ca x Phos = 58.8, goal < 55) Renal: SCr up 7.1, BUN up to 86 - Nephrology on board, no urgent need for RRT at this time.  Hepatic: LFTs WNL except mildly elevated AST, tbili / TG WNL, albumin 1.8 Intake / Output; MIVF: UOP 0.8 ml/kg/hr (last Lasix 9/14), NG 1050 mL, wound vac 230 mL, net + GI Imaging: none since TPN initiation  GI Surgeries / Procedures:  9/14 re-ex lap, left in discontinuity with plans for re-exploration  9/16 re-ex lap, small bowel resection with anastomosis x2, abdominal clsure  Central access: CVC LIJ 12/23/22 TPN start date: 12/23/22  Nutritional Goals: Goal TPN rate is 90 mL/hr (provides 124g AA and 1912 kCal per day)  RD Estimated Needs Total Energy Estimated Needs: 1900-2100 kacls Total Protein Estimated Needs: 120-140 g Total Fluid Estimated Needs: 1.7 L  Current Nutrition:  TPN Propofol off  Plan:  Unable to concentrate TPN due to AA shortage Continue TPN at 85 ml/hr starting at 1800 Electrolytes in TPN: Na 61mEq/L, K 12 mEq/L (~25 mEq/day), reduce Ca to 5 mEq/L, Mg 5 mEq/L, Phos 0 mmol/L, max acetate Add standard MVI and trace elements to TPN, no chromium d/t shortage Continue sensitive SSI Q4H for now Monitor TPN  labs on Mon/Thurs - labs in AM F/u need for RRT Monitor bowel function  Link Snuffer, PharmD, BCPS, BCCCP Please refer to [facility] for [facility] Pharmacy numbers 12/26/2022, 11:57 AM

## 2022-12-26 NOTE — Progress Notes (Signed)
ANTICOAGULATION CONSULT NOTE - Follow Up  Pharmacy Consult for heparin >> argatroban Indication: SMA stenosis/occlusion  Allergies  Allergen Reactions   Zestril [Lisinopril] Cough    Patient Measurements: Height: 5\' 11"  (180.3 cm) Weight: 105.4 kg (232 lb 5.8 oz) IBW/kg (Calculated) : 75.3 Heparin Dosing Weight: 94.6kg  Vital Signs: Temp: 97.7 F (36.5 C) (09/17 0700) Temp Source: Esophageal (09/17 0400) BP: 107/58 (09/17 0700) Pulse Rate: 74 (09/17 0700)  Labs: Recent Labs    12/24/22 1900 12/24/22 1937 12/25/22 0000 12/25/22 0345 12/25/22 0937 12/25/22 1523 12/26/22 0014 12/26/22 0429  HGB  --   --   --  9.4*  --  10.5*  --  9.4*  HCT  --   --   --  29.0*  --  34.9*  --  30.3*  PLT  --   --   --  152  --  192  --  149*  APTT  --   --   --   --   --   --  63* 74*  HEPARINUNFRC <0.10*  --  <0.10*  --  <0.10*  --   --   --   CREATININE  --  6.87*  --  6.97*  --   --   --  7.10*    Estimated Creatinine Clearance: 12.3 mL/min (A) (by C-G formula based on SCr of 7.1 mg/dL (H)).   Assessment: 32 YOM presenting with abdominal pain found to have mesenteric ischemia. Pharmacy consulted for IV heparin. No AC PTA. Pt s/p ex lap, bowel resection, thrombectomy, and angioplasty 9/12, heparin resumed postop.  Despite multiple rate increases, unable to get heparin to a detectable level.  No issues with IV infusions.  ?AT3 deficiency?  Discussed with surgery and CCM, patient transitioned to direct thrombin inhibitor argatroban (preferred over bivalirudin in renal dysfunction).  Aptt this morning is slightly above reduced goal range, no overt bleeding or complications noted.  CBC stable.  Goal of Therapy:  aPTT 50-90 seconds (Aim for 50-70 seconds given recent OR admission) Monitor platelets by anticoagulation protocol: Yes   Plan:  Reduce argatroban to 0.4 mcg/kg/min  Check repeat aPTT in 4 hrs  Daily CBC and aPTT.  Reece Leader, Colon Flattery, BCCP Clinical  Pharmacist  12/26/2022 7:32 AM   [facility] pharmacy phone numbers are listed on [email or web address]

## 2022-12-26 NOTE — Plan of Care (Signed)
  Problem: Clinical Measurements: Goal: Ability to maintain clinical measurements within normal limits will improve Outcome: Progressing Goal: Will remain free from infection Outcome: Progressing Goal: Diagnostic test results will improve Outcome: Progressing   Problem: Nutrition: Goal: Adequate nutrition will be maintained Outcome: Progressing   Problem: Safety: Goal: Ability to remain free from injury will improve Outcome: Progressing

## 2022-12-27 DIAGNOSIS — K551 Chronic vascular disorders of intestine: Secondary | ICD-10-CM | POA: Diagnosis not present

## 2022-12-27 LAB — RENAL FUNCTION PANEL
Albumin: 1.7 g/dL — ABNORMAL LOW (ref 3.5–5.0)
Anion gap: 13 (ref 5–15)
BUN: 116 mg/dL — ABNORMAL HIGH (ref 8–23)
CO2: 23 mmol/L (ref 22–32)
Calcium: 8.2 mg/dL — ABNORMAL LOW (ref 8.9–10.3)
Chloride: 102 mmol/L (ref 98–111)
Creatinine, Ser: 7.07 mg/dL — ABNORMAL HIGH (ref 0.61–1.24)
GFR, Estimated: 8 mL/min — ABNORMAL LOW (ref >60)
Glucose, Bld: 162 mg/dL — ABNORMAL HIGH (ref 70–99)
Phosphorus: 4.3 mg/dL (ref 2.5–4.6)
Potassium: 3.4 mmol/L — ABNORMAL LOW (ref 3.5–5.1)
Sodium: 138 mmol/L (ref 135–145)

## 2022-12-27 LAB — AMMONIA: Ammonia: 12 umol/L (ref 9–35)

## 2022-12-27 LAB — CBC
HCT: 28.7 % — ABNORMAL LOW (ref 39.0–52.0)
Hemoglobin: 9.1 g/dL — ABNORMAL LOW (ref 13.0–17.0)
MCH: 25.1 pg — ABNORMAL LOW (ref 26.0–34.0)
MCHC: 31.7 g/dL (ref 30.0–36.0)
MCV: 79.3 fL — ABNORMAL LOW (ref 80.0–100.0)
Platelets: 183 10*3/uL (ref 150–400)
RBC: 3.62 MIL/uL — ABNORMAL LOW (ref 4.22–5.81)
RDW: 14.8 % (ref 11.5–15.5)
WBC: 13.5 10*3/uL — ABNORMAL HIGH (ref 4.0–10.5)
nRBC: 0 % (ref 0.0–0.2)

## 2022-12-27 LAB — GLUCOSE, CAPILLARY
Glucose-Capillary: 146 mg/dL — ABNORMAL HIGH (ref 70–99)
Glucose-Capillary: 148 mg/dL — ABNORMAL HIGH (ref 70–99)
Glucose-Capillary: 149 mg/dL — ABNORMAL HIGH (ref 70–99)
Glucose-Capillary: 150 mg/dL — ABNORMAL HIGH (ref 70–99)
Glucose-Capillary: 152 mg/dL — ABNORMAL HIGH (ref 70–99)
Glucose-Capillary: 157 mg/dL — ABNORMAL HIGH (ref 70–99)

## 2022-12-27 LAB — APTT: aPTT: 67 s — ABNORMAL HIGH (ref 24–36)

## 2022-12-27 MED ORDER — ROSUVASTATIN CALCIUM 20 MG PO TABS: 40.0000 mg | ORAL_TABLET | Freq: Every day | ORAL | Status: DC

## 2022-12-27 MED ORDER — TRAVASOL 10 % IV SOLN
INTRAVENOUS | Status: AC
  Filled 2022-12-27: qty 1458.2

## 2022-12-27 MED ORDER — LIDOCAINE HCL (PF) 1 % IJ SOLN: 5.0000 mL | INTRAMUSCULAR | Status: DC | PRN

## 2022-12-27 MED ORDER — LIDOCAINE-PRILOCAINE 2.5-2.5 % EX CREA: 1.0000 | TOPICAL_CREAM | CUTANEOUS | Status: DC | PRN

## 2022-12-27 MED ORDER — CHLORHEXIDINE GLUCONATE CLOTH 2 % EX PADS
6.0000 | MEDICATED_PAD | Freq: Every day | CUTANEOUS | Status: DC
  Administered 2022-12-28: 6 via TOPICAL

## 2022-12-27 MED ORDER — HEPARIN SODIUM (PORCINE) 1000 UNIT/ML DIALYSIS
1000.0000 [IU] | INTRAMUSCULAR | Status: DC | PRN
  Administered 2022-12-27: 2400 [IU]
  Administered 2023-01-13: 1000 [IU]
  Filled 2022-12-27: qty 1

## 2022-12-27 MED ORDER — ALTEPLASE 2 MG IJ SOLR: 2.0000 mg | Freq: Once | INTRAMUSCULAR | Status: DC | PRN

## 2022-12-27 MED ORDER — CARVEDILOL 6.25 MG PO TABS: 6.2500 mg | ORAL_TABLET | Freq: Two times a day (BID) | ORAL | Status: DC

## 2022-12-27 MED ORDER — ACETAMINOPHEN 10 MG/ML IV SOLN
1000.0000 mg | Freq: Four times a day (QID) | INTRAVENOUS | Status: AC
  Administered 2022-12-27 – 2022-12-28 (×4): 1000 mg via INTRAVENOUS
  Filled 2022-12-27 (×4): qty 100

## 2022-12-27 MED ORDER — ANTICOAGULANT SODIUM CITRATE 4% (200MG/5ML) IV SOLN
5.0000 mL | Status: DC | PRN
  Administered 2023-01-02: 5 mL
  Filled 2022-12-27 (×3): qty 5

## 2022-12-27 MED ORDER — PENTAFLUOROPROP-TETRAFLUOROETH EX AERO: 1.0000 | INHALATION_SPRAY | CUTANEOUS | Status: DC | PRN

## 2022-12-27 NOTE — Progress Notes (Signed)
NAME:  [name] [name], MRN:  [id number], DOB:  05-Feb-1956, LOS: 7 ADMISSION DATE:  12/20/2022, CONSULTATION DATE: 9/12 REFERRING MD: Dr. Jacqulyn Bath, CHIEF COMPLAINT: Mesenteric ischemia  History of Present Illness:   67 year old male with past medical history as below, which is significant for intestinal stromal tumor removal in 2022, known mesenteric stenosis, GERD, hypertension, and hyperlipidemia.  In the few weeks prior to presentation on 9/11 he had been evaluated at several campuses for abdominal pain with noncontrasted CTs of the abdomen which were unrevealing.  He continued to have abdominal pain, weight loss, and poor p.o. intake.  He presented [name] on 9/11 and was admitted to the hospitalist for abdominal pain with concern for superior mesenteric artery thrombus.  He was started on heparin infusion.  Vascular surgery was consulted and ultimately a CT angiogram of the abdomen and pelvis was done 9/12 demonstrating acute occlusion of the distal SMA as well as occlusion of the inferior mesenteric artery.  He was taken to the operating room emergently and and was found to have an necrotic area of the jejunum.  This was resected, and SMA stent was placed, and the patient was left with an open abdomen.  He also remained on the mechanical ventilator postoperatively and PCCM was asked to admit for ventilator management.  Pertinent  Medical History   has a past medical history of Ankle fracture, Cancer (HCC) (1974), Cataract, GERD (gastroesophageal reflux disease), colonic polyps, Hyperlipidemia, Hypertension, Kidney stones, Patella fracture, Retinal detachment, Retinal tear of right eye, and Syncopal episodes (2008).   Significant Hospital Events: Including procedures, antibiotic start and stop dates in addition to other pertinent events   9/11 admitted with abdominal pain 9/12 CT demonstrating mesenteric occlusion, bowel resection and stent placed, to ICU on vent. Left in discontinuity  9/14  back to OR. Left in discontinuity as still concern about bowel integrity 9/16 back to OR  s sig areas of ischemia w/ 2 areas of perforation, another 66cm section of jejunum was resected and area of discontinuity was reanastomosed. Changed to argatroban post-op as unable to get heparin levels detectable  9/17 dropping RASS goal to 0 to -1, assessing for readiness to wean, serum creatinine and BUN still climbing some but making urine  Interim History / Subjective:  Sedation off Still lethargic, winces to pain On PSV on vent  Objective   Blood pressure (!) 150/81, pulse (!) 101, temperature 98.4 F (36.9 C), resp. rate (!) 23, height 5\' 11"  (1.803 m), weight 104.8 kg, SpO2 99%. CVP:  [3 mmHg-9 mmHg] 9 mmHg  Vent Mode: PSV;CPAP FiO2 (%):  [40 %] 40 % Set Rate:  [15 bmp] 15 bmp Vt Set:  [600 mL] 600 mL PEEP:  [5 cmH20] 5 cmH20 Pressure Support:  [5 cmH20-10 cmH20] 10 cmH20 Plateau Pressure:  [15 cmH20-18 cmH20] 15 cmH20   Intake/Output Summary (Last 24 hours) at 12/27/2022 0953 Last data filed at 12/27/2022 0830 Gross per 24 hour  Intake 2504.15 ml  Output 3140 ml  Net -635.85 ml   Filed Weights   12/25/22 0500 12/26/22 0308 12/27/22 0416  Weight: 106.4 kg 105.4 kg 104.8 kg    Examination:  General: critically ill appearing on mech vent HEENT: MM pink/moist; ETT in place Neuro: off sedation; winces to pain; PERRL CV: s1s2, RRR, no m/r/g PULM:  dim clear BS bilaterally; on mech vent PSV GI: soft, absent BS; wound dressing in place Extremities: warm/dry, no edema  Skin: no rashes or lesions   Resolved  Hospital Problem list     Assessment & Plan:   Acute mesenteric ischemia: Status post small bowel resection and superior mesenteric artery stent placement 9/12.  Open abdomen, left in discontinuity since 9/12, another 66 cm of jejunum was resected on 9/16 prior to successful reanastomosis of the small bowel Plan: -CCS and VVS following; appreciate recs -starting on trickle TF;  cont TPN -argatroban gtt -wound care per surgery -cont zosyn w/ stop date 9/19 -pain management  Postoperative ventilator management-  Plan: -psv during day as tolerated; no extubation due to poor mental status -rest on PRVC overnight -Wean PEEP/FiO2 for SpO2 >92% -VAP bundle in place -Daily SAT and SBT -PAD protocol in place -wean sedation for RASS goal 0 to -1  Acute encephalopathy: likely uremia and sedation related Plan: -currently off sedation; limit sedating meds -plan for iHD today -check ammonia -consider CT head if no improvement in mental status  Intermittent tachycardia and hypertension Plan: -pain management -prn metoprolol  AKI on CKD3: in context of mesenteric ischemia, fluid shifts and likely ATN.   His BUN continues to climb acidosis creatinine Plan: -UOP 2.8 L last 24 hours; creat 7 and bun 110 -nephro following; given AMS plan for iHD today -Trend BMP / urinary output -Replace electrolytes as indicated -Avoid nephrotoxic agents, ensure adequate renal perfusion  Hyperglycemia Plan: -ssi and cbg monitoring  Postop anemia- expected in context of blood losses from surgery and bone marrow stunning in context of critical illness, hgb stable  Plan: -trend cbc  Protein calorie malnutrition : significant weight loss prior to this admission, now NPO Plan: -trickle TF per CCS -TPN  Sacral decub/deep tissue wound. Still not at place where can be staged Plan: -WOC following  Hx HTN, cataracts, GERD, HLD Plan: -resume home statin and start on antihypertensives when able to use gut  Best Practice (right click and "Reselect all SmartList Selections" daily)   Diet/type: TPN DVT prophylaxis: argatroban GI prophylaxis: PPI Lines: Central line and Dialysis Catheter Foley:  Yes, and it is still needed Code Status:  full code Last date of multidisciplinary goals of care discussion [updated wife at bedside 9/18]   My time 2 minutes Simonne Martinet  ACNP-BC [facility] Pulmonary/Critical Care Pager # [phone] OR # [phone] if no answer

## 2022-12-27 NOTE — Progress Notes (Signed)
Nutrition Follow-up  DOCUMENTATION CODES:   Severe malnutrition in context of acute illness/injury  INTERVENTION:   TPN to meet 100% nutritional needs; TPN order per Pharmacy. Discussed nutrition plan and adjusted needs with TPN Pharmacist -Calorie needs increased heading into late acute phase of critical illness, protein needs up as well for increased need for wound healing  Plan for continued bowel rest for now per Surgery.  -Once able, recommend starting Vital 1.5 at trickle rate  NUTRITION DIAGNOSIS:   Severe Malnutrition related to acute illness as evidenced by energy intake < or equal to 50% for > or equal to 5 days, percent weight loss.  Being addressed   GOAL:   Patient will meet greater than or equal to 90% of their needs  Progressing  MONITOR:   Vent status, Labs, Weight trends  REASON FOR ASSESSMENT:   Ventilator, Consult New TPN/TNA  ASSESSMENT:   67 yo male admitted with SMA stenosis/thrombosis and SB ischemia requiring SMA thrombectomy and stent followed by ex lap and SB resection. Pt with poor po intake and weight loss for several weeks PTA. 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 Baseline Creatinine around 2.0  UOP 2.84 L in 24 hours, NG 120 mL overnight.   Noted Surgery ok with trickle tube feeds per NG tube at 10 ml/hr with NO Advancement  but later wanting to hold for now. No BM yet, no flatus, BS hypoactive, bilious NG output, 400 mL in cannister  Pt with newly documented DTI to bilateral buttocks  Current wt 104.8 kg; admit wt 95.7 kg. +Edema on exam  Labs: CBGs 140-150s (ICU goal 140-180) Meds: reviewed    Diet Order:   Diet Order             Diet NPO time specified  Diet effective midnight                    EDUCATION NEEDS:   Not appropriate for education at this time  Skin:  Skin Assessment: Skin Integrity Issues: Skin Integrity Issues:: DTI DTI: bilateral buttocks Wound Vac: n/a  Last BM:  12/17/22  Height:   Ht Readings from Last 1 Encounters:  12/20/22 5\' 11"  (1.803 m)    Weight:   Wt Readings from Last 1 Encounters:  12/27/22 104.8 kg     BMI:  Body mass index is 32.22 kg/m.  Estimated Nutritional Needs:   Kcal:  2300-2500 kcals  Protein:  140 -150 g  Fluid:  >/= 2.3 L  Romelle Starcher MS, RDN, LDN, CNSC Registered Dietitian 3 Clinical Nutrition RD Pager and On-Call Pager Number Located in [place]

## 2022-12-27 NOTE — Progress Notes (Signed)
PHARMACY - TOTAL PARENTERAL NUTRITION CONSULT NOTE  Indication:  SMA syndrome w associated weight loss  Patient Measurements: Height: 5\' 11"  (180.3 cm) Weight: 104.8 kg (231 lb 0.7 oz) IBW/kg (Calculated) : 75.3 TPN AdjBW (KG): 80.4 Body mass index is 32.22 kg/m. Usual Weight: 95 kg, has lost 50lbs recently   Assessment:  67 yo M admitted with abdominal pain, weight loss, and poor PO intake. 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 He is now s/p SMA stent placement. He was initially left is discontinuity with open abdomen and was closed on 9/16. TPN was initiated 9/14.   Glucose / Insulin: A1c 5.3 - CBGs < 180 Used 4 units SSI in the past 24 hrs Electrolytes: K 3.5, Phos trending down 4.4 (no Phos in TPN, Ca reduced in TPN on 9/18, CoCa 10.1 (based on 9/16 Alb). Ca x Phos improved at 44, goal < 55). CO2 low at 21.  Renal: SCr up 7, BUN up to 110, unresponsive mentally - Nephrology on board, planning for iHD today.  Hepatic: on 9/16, LFTs WNL except mildly elevated AST, tbili / TG WNL, albumin 1.8 - labs to repeat 9/19 Intake / Output; MIVF: UOP 1.1 ml/kg/hr (last Lasix 9/14), NG down 200 mL, wound vac output not reported for last 24 hrs. Net I/O improved closer to even. LBM 9/8 GI Imaging: none since TPN initiation  GI Surgeries / Procedures:  9/14 re-ex lap, left in discontinuity with plans for re-exploration  9/16 re-ex lap, small bowel resection with anastomosis x2, abdominal closure Wounds (aside from abdomen): Deep tissue pressure injury to bilateral buttocks - wound care on board  Central access: CVC [facility] 12/23/22 TPN start date: 12/23/22  Nutritional Goals: Goal TPN rate is 98 mL/hr (provides 146g AA and 2304 kCal per day) - adjusted 9/18 for increased estimated needs   RD Estimated Needs Total Energy Estimated Needs: 2300-2500 kcals Total Protein Estimated Needs: 140 -150 g Total Fluid Estimated Needs: >/= 2.3  L  Current Nutrition:  TPN and Tube feeding at trickle rate only while awaiting bowel function   Plan:  Unable to concentrate TPN due to AA shortage.  Adjust TPN to new goal rate of 98 ml/hr starting at 1800.  Electrolytes in TPN: Na 39mEq/L, K 12 mEq/L (~28 mEq/day), Ca to 5 mEq/L, Mg 5 mEq/L, Phos 0 mmol/L, continue to max acetate Add standard MVI and trace elements to TPN. No chromium due to HD and on shortage.  Continue sensitive SSI Q4H for now Monitor TPN labs on Mon/Thurs - labs in AM Follow-up further HD plans.  Monitor bowel function and ability to advance tube feeds.   Link Snuffer, PharmD, BCPS, BCCCP Please refer to [facility] for [facility] numbers 12/27/2022, 8:51 AM

## 2022-12-27 NOTE — Progress Notes (Signed)
   Informed consent signed and in chart.    TX duration: 2 hours     Hand-off given to patient's nurse.    Access used: R CVC Access issues: None   Total UF removed: 1000 mL Medication(s) given: None Post HD VS: WDL x tachycardia Post HD weight: 106.2 kg     [facility]

## 2022-12-27 NOTE — Plan of Care (Signed)

## 2022-12-27 NOTE — Plan of Care (Signed)

## 2022-12-27 NOTE — Progress Notes (Addendum)
[facility] Progress Note  2 Days Post-Op  Subjective: CC:  Intubated. Precedex/all sedation off, pt responds to noxious stimuli and per RN he was following commands to wiggle this toes this AM, but took a lot of effort to wake him up.  NG low output - 200 mL overnight.  No bowel function per RN  Afebrile, WBC 15 Objective: Vital signs in last 24 hours: Temp:  [98.1 F (36.7 C)-100.2 F (37.9 C)] 98.1 F (36.7 C) (09/18 0740) Pulse Rate:  [78-121] 107 (09/18 0740) Resp:  [13-25] 24 (09/18 0740) BP: (111-178)/(59-82) 136/78 (09/18 0740) SpO2:  [96 %-99 %] 98 % (09/18 0743) Arterial Line BP: (198)/(64) 198/64 (09/17 1000) FiO2 (%):  [40 %] 40 % (09/18 0743) Weight:  [104.8 kg] 104.8 kg (09/18 0416) Last BM Date : 12/17/22  Intake/Output from previous day: 09/17 0701 - 09/18 0700 In: 2622.7 [I.V.:2222.7; NG/GT:250; IV Piggyback:150] Out: 2960 [Urine:2840; Emesis/NG output:120] Intake/Output this shift: No intake/output data recorded.  PE: Gen:  intubated Card:  Regular rate and rhythm Pulm:  ventilated respirations Abd: Soft, mild distention, Midline wound c/d/I, dressing last changed today at 0400 GU: Clear yellow urine in foley bag Skin: warm and dry Psych:unable to assess  Lab Results:  Recent Labs    12/26/22 0429 12/27/22 0455  WBC 14.6* 15.5*  HGB 9.4* 9.8*  HCT 30.3* 30.8*  PLT 149* 169   BMET Recent Labs    12/26/22 0429 12/27/22 0455  NA 135 139  K 4.1 3.5  CL 102 102  CO2 21* 21*  GLUCOSE 153* 157*  BUN 86* 110*  CREATININE 7.10* 7.05*  CALCIUM 8.0* 8.3*   PT/INR No results for input(s): "LABPROT", "INR" in the last 72 hours. CMP     Component Value Date/Time   NA 139 12/27/2022 0455   K 3.5 12/27/2022 0455   CL 102 12/27/2022 0455   CO2 21 (L) 12/27/2022 0455   GLUCOSE 157 (H) 12/27/2022 0455   BUN 110 (H) 12/27/2022 0455   CREATININE 7.05 (H) 12/27/2022 0455   CALCIUM 8.3 (L) 12/27/2022 0455   PROT 5.0 (L) 12/25/2022  0345   PROT 6.3 03/16/2020 0800   ALBUMIN 1.8 (L) 12/25/2022 0345   ALBUMIN 4.3 03/16/2020 0800   AST 79 (H) 12/25/2022 0345   ALT 32 12/25/2022 0345   ALKPHOS 45 12/25/2022 0345   BILITOT 0.7 12/25/2022 0345   BILITOT 0.8 03/16/2020 0800   GFRNONAA 8 (L) 12/27/2022 0455   GFRAA >60 04/14/2015 1127   Lipase     Component Value Date/Time   LIPASE 28 11/27/2022 0755       Studies/Results: VAS US RENAL ARTERY DUPLEX  Result Date: 12/26/2022 ABDOMINAL VISCERAL Patient Name:  [name] [name]  Date of Exam:   12/26/2022 Medical Rec #: [id number]      Accession #:    [id number] Date of Birth: [birth year]      Patient Gender: M Patient Age:   1 years Exam Location:  [facility] Procedure:      VAS US RENAL ARTERY DUPLEX Referring Phys: [id number] JOSHUA E ROBINS -------------------------------------------------------------------------------- Indications: Concerned for right renal artery occlusion. High Risk Factors: Hypertension, hyperlipidemia. Vascular Interventions: SMA stenting for acute mesenteric ischemia on 12/23/22. Limitations: Air/bowel gas, obesity and BANDAGES IN PLACE FROM SURGERY ON 12/25/22 - RE-EXPLORATION LAPAROTOMY W/CLOSURE (Abdomen) SMALL BOWEL RESECTION W/ANASTOMOSIS. Comparison Study: Renal duplex on 12/05/22 - Patent renal arteries bilaterally  with small right kidney. Performing Technologist: Erin Hearing  Examination Guidelines: A complete evaluation includes B-mode imaging, spectral Doppler, color Doppler, and power Doppler as needed of all accessible portions of each vessel. Bilateral testing is considered an integral part of a complete examination. Limited examinations for reoccurring indications may be performed as noted.  Duplex Findings:  Mesenteric Technologist observations: Aorta and mesenteric and celiac arteries not visualized due to s/p surgery.   +------------------+--------+--------+--------------+ Right Renal ArteryPSV cm/sEDV cm/s   Comment      +------------------+--------+--------+--------------+ Origin                            Not visualized +------------------+--------+--------+--------------+ Proximal             54      24                  +------------------+--------+--------+--------------+ Mid                  53      22                  +------------------+--------+--------+--------------+ Distal               64      29                  +------------------+--------+--------+--------------+ +-----------------+--------+--------+-------------------+ Left Renal ArteryPSV cm/sEDV cm/s      Comment       +-----------------+--------+--------+-------------------+ Origin              73      15   not well visualized +-----------------+--------+--------+-------------------+ Proximal            93      18                       +-----------------+--------+--------+-------------------+ Mid                 96      13                       +-----------------+--------+--------+-------------------+ Distal              57      13                       +-----------------+--------+--------+-------------------+ +------------+--------+--------+----+-----------+--------+--------+----+ Right KidneyPSV cm/sEDV cm/sRI  Left KidneyPSV cm/sEDV cm/sRI   +------------+--------+--------+----+-----------+--------+--------+----+ Upper Pole  10      6       0.35Upper Pole 21      7       0.66 +------------+--------+--------+----+-----------+--------+--------+----+ Mid         19      9       0.        23      7       0.71 +------------+--------+--------+----+-----------+--------+--------+----+ Lower Pole  0       0           Lower Pole 18      7       0.60 +------------+--------+--------+----+-----------+--------+--------+----+ Hilar       29      11      0.62Hilar      24      8       0.66 +------------+--------+--------+----+-----------+--------+--------+----+  +------------------+-----+------------------+-----+ Right Kidney  Left Kidney             +------------------+-----+------------------+-----+ RAR                    RAR                     +------------------+-----+------------------+-----+ RAR (manual)           RAR (manual)            +------------------+-----+------------------+-----+ Cortex                 Cortex                  +------------------+-----+------------------+-----+ Cortex thickness       Corex thickness         +------------------+-----+------------------+-----+ Kidney length (cm)10.24Kidney length (cm)14.60 +------------------+-----+------------------+-----+  Summary: Renal:  Right: Patent right renal artery with abnormal tardus-parvus        waveforms which is indicative of high grade proximal        stenosis. Right kidney is smaller in size when compared to        the left with increased echogenicity. Left:  Patent left renal artery without evidence of a significant        stenosis in the proximal, mid and distal segments.  *See table(s) above for measurements and observations.  Diagnosing physician: Waverly Ferrari MD  Electronically signed by Waverly Ferrari MD on 12/26/2022 at 4:52:32 PM.    Final    PERIPHERAL VASCULAR CATHETERIZATION  Result Date: 12/25/2022 See surgical note for result.   Anti-infectives: Anti-infectives (From admission, onward)    Start     Dose/Rate Route Frequency Ordered Stop   12/23/22 1230  piperacillin-tazobactam (ZOSYN) IVPB 2.25 g        2.25 g 100 mL/hr over 30 Minutes Intravenous Every 8 hours 12/23/22 1139     12/22/22 0000  piperacillin-tazobactam (ZOSYN) IVPB 3.375 g  Status:  Discontinued        3.375 g 12.5 mL/hr over 240 Minutes Intravenous Every 8 hours 12/21/22 2331 12/23/22 1139        Assessment/Plan 67 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 Karin Lieu, exploratory  laparotomy and small bowel resection Dr. Janee Morn S/p re-exploration, replace abthera FB 12/23/22   S/p re-exploration, small bowel resection with anastomosis x2, assessment of bowel perfusion with ICG, abdominal closure 9/16 Dr. Andrey Campanile - POD#2 - afebrile, VSS, WBC 15 -  await bowel function - ok to trickle tube feeds per NG tube today at 10 mL/hr, but DO NOT ADVANCE.  - remains on rectal ASA , switched to argatroban due to inability to get heparin up to a detectable level  - Cr 7.05, noted plans for iHD today per nephrology  - continue Zosyn, would continue for 3 days (stop date 9/19 @ 2359) - TPN per pharmacy - CCS will follow closely    LOS: 7 days   I reviewed nursing notes, Consultant nephrology, vascular notes, hospitalist notes, last 24 h vitals and pain scores, last 48 h intake and output, last 24 h labs and trends, and last 24 h imaging results.    [name] Spangle, PA-C [facility] Please see Amion for pager number during day hours 7:00am-4:30pm

## 2022-12-27 NOTE — Progress Notes (Signed)
[name] NEPHROLOGY PROGRESS NOTE  Assessment/ Plan: Pt is a 67 y.o. 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   # Acute kidney injury on CKD IIIb: Baseline creatinine level around 2.0.  Acute kidney injury likely ischemic ATN due to ex lap surgery/IV contrast/hypotension with hemodynamic changes. -remains non-oliguric. However, labs worsening. Agree with not intervening on right RAS given that his right kidney is already much smaller than anticipated. Likely not much function being contributed from right. -Given poor mental status despite being off sedation, would favor IHD today. Will reassess tomorrow. Discussed with wife over the phone and she is in agreement -Strict ins and out and close lab monitoring.   # Acute mesenteric ischemia: Status post small bowel resection and superior mesenteric artery stent placement.  Followed by vascular surgeon and currently on agatroban since there was inadequate response to heparin. S/p OR again 9/16 for exlap and further small bowel resection, abd closed   # Vent dependent respiratory failure: Per PCCM. HD as above for clearance to see if this helps his mental status   # Anemia likely due to blood loss: Monitor CBC and transfuse as as per ICU's discretion.   # History of hypertension: BP is currently acceptable  Discussed with primary service. Discussed with wife over the phone  Anthony Sar, MD [facility]  Subjective: Seen and examined at bedside.  Off sedation, remains unresponsive. Uop 2.8L Objective Vital signs in last 24 hours: Vitals:   12/27/22 0600 12/27/22 0700 12/27/22 0740 12/27/22 0743  BP: (!) 149/70 (!) 153/82 136/78   Pulse: 95 92 (!) 107   Resp: (!) 22  (!) 21 (!) 24   Temp: 99 F (37.2 C) 98.6 F (37 C) 98.1 F (36.7 C)   TempSrc:      SpO2: 98% 98% 98% 98%  Weight:      Height:       Weight change: -0.6 kg  Intake/Output Summary (Last 24 hours) at 12/27/2022 0812 Last data filed at 12/27/2022 0612 Gross per 24 hour  Intake 2440.38 ml  Output 2860 ml  Net -419.62 ml       Labs: RENAL PANEL Recent Labs  Lab 12/20/22 1441 12/20/22 2318 12/21/22 1452 12/22/22 0458 12/22/22 0501 12/23/22 0516 12/24/22 0500 12/24/22 1937 12/25/22 0345 12/26/22 0429 12/27/22 0455  NA 138 138   < > 135   < > 135 136 136 137 135 139  K 3.8 3.2*   < > 3.8   < > 3.8 3.7 3.6 3.7 4.1 3.5  CL 99 100   < > 103  --  103 102 100 105 102 102  CO2 29 27   < > 22  --  22 21* 21* 22 21* 21*  GLUCOSE 126* 134*   < > 141*  --  125* 126* 127* 123* 153* 157*  BUN 36* 37*   < > 36*  --  46* 57* 66* 72* 86* 110*  CREATININE 3.00* 2.90*   < > 3.32*  --  4.99* 6.02* 6.87* 6.97* 7.10* 7.05*  CALCIUM 8.6* 8.6*   < > 8.0*  --  7.5* 7.6* 7.8* 7.9* 8.0* 8.3*  MG  --  1.6*   < > 2.0  --  1.9 2.0  --  2.2 2.0  2.1  PHOS  --   --    < >  --   --  5.3* 5.8*  --  6.0* 6.0* 4.4  ALBUMIN 3.1* 2.8*  --  2.3*  --   --   --  1.8* 1.8*  --   --    < > = values in this interval not displayed.    Liver Function Tests: Recent Labs  Lab 12/22/22 0458 12/24/22 1937 12/25/22 0345  AST 45* 72* 79*  ALT 69* 29 32  ALKPHOS 57 38 45  BILITOT 1.1 0.6 0.7  PROT 5.6* 4.6* 5.0*  ALBUMIN 2.3* 1.8* 1.8*   No results for input(s): "LIPASE", "AMYLASE" in the last 168 hours. No results for input(s): "AMMONIA" in the last 168 hours. CBC: Recent Labs    12/24/22 0500 12/25/22 0345 12/25/22 1523 12/26/22 0429 12/27/22 0455  HGB 9.2* 9.4* 10.5* 9.4* 9.8*  MCV 79.6* 80.3 81.5 80.2 80.4    Cardiac Enzymes: No results for input(s): "CKTOTAL", "CKMB", "CKMBINDEX", "TROPONINI" in the last 168 hours. CBG: Recent Labs  Lab 12/26/22 1628 12/26/22 2023 12/26/22 2345  12/27/22 0322 12/27/22 0744  GLUCAP 141* 128* 142* 152* 149*    Iron Studies: No results for input(s): "IRON", "TIBC", "TRANSFERRIN", "FERRITIN" in the last 72 hours. Studies/Results: VAS US RENAL ARTERY DUPLEX  Result Date: 12/26/2022 ABDOMINAL VISCERAL Patient Name:  [name] [name]  Date of Exam:   12/26/2022 Medical Rec #: [id number]      Accession #:    [id number] Date of Birth: 1955-09-20      Patient Gender: M Patient Age:   53 years Exam Location:  [facility] Procedure:      VAS US RENAL ARTERY DUPLEX Referring Phys: [id number] JOSHUA E ROBINS -------------------------------------------------------------------------------- Indications: Concerned for right renal artery occlusion. High Risk Factors: Hypertension, hyperlipidemia. Vascular Interventions: SMA stenting for acute mesenteric ischemia on 12/23/22. Limitations: Air/bowel gas, obesity and BANDAGES IN PLACE FROM SURGERY ON 12/25/22 - RE-EXPLORATION LAPAROTOMY W/CLOSURE (Abdomen) SMALL BOWEL RESECTION W/ANASTOMOSIS. Comparison Study: Renal duplex on 12/05/22 - Patent renal arteries bilaterally                   with small right kidney. Performing Technologist: Erin Hearing  Examination Guidelines: A complete evaluation includes B-mode imaging, spectral Doppler, color Doppler, and power Doppler as needed of all accessible portions of each vessel. Bilateral testing is considered an integral part of a complete examination. Limited examinations for reoccurring indications may be performed as noted.  Duplex Findings:  Mesenteric Technologist observations: Aorta and mesenteric and celiac arteries not visualized due to s/p surgery.   +------------------+--------+--------+--------------+ Right Renal ArteryPSV cm/sEDV cm/s   Comment     +------------------+--------+--------+--------------+ Origin                            Not visualized +------------------+--------+--------+--------------+ Proximal             54      24                   +------------------+--------+--------+--------------+ Mid                  53      22                  +------------------+--------+--------+--------------+ Distal               64      29                  +------------------+--------+--------+--------------+ +-----------------+--------+--------+-------------------+  Left Renal ArteryPSV cm/sEDV cm/s      Comment       +-----------------+--------+--------+-------------------+ Origin              73      15   not well visualized +-----------------+--------+--------+-------------------+ Proximal            93      18                       +-----------------+--------+--------+-------------------+ Mid                 96      13                       +-----------------+--------+--------+-------------------+ Distal              57      13                       +-----------------+--------+--------+-------------------+ +------------+--------+--------+----+-----------+--------+--------+----+ Right KidneyPSV cm/sEDV cm/sRI  Left KidneyPSV cm/sEDV cm/sRI   +------------+--------+--------+----+-----------+--------+--------+----+ Upper Pole  10      6       0.35Upper Pole 21      7       0.66 +------------+--------+--------+----+-----------+--------+--------+----+ Mid         19      9       0.        23      7       0.71 +------------+--------+--------+----+-----------+--------+--------+----+ Lower Pole  0       0           Lower Pole 18      7       0.60 +------------+--------+--------+----+-----------+--------+--------+----+ Hilar       29      11      0.62Hilar      24      8       0.66 +------------+--------+--------+----+-----------+--------+--------+----+ +------------------+-----+------------------+-----+ Right Kidney           Left Kidney             +------------------+-----+------------------+-----+ RAR                    RAR                      +------------------+-----+------------------+-----+ RAR (manual)           RAR (manual)            +------------------+-----+------------------+-----+ Cortex                 Cortex                  +------------------+-----+------------------+-----+ Cortex thickness       Corex thickness         +------------------+-----+------------------+-----+ Kidney length (cm)10.24Kidney length (cm)14.60 +------------------+-----+------------------+-----+  Summary: Renal:  Right: Patent right renal artery with abnormal tardus-parvus        waveforms which is indicative of high grade proximal        stenosis. Right kidney is smaller in size when compared to        the left with increased echogenicity. Left:  Patent left renal artery without evidence of a significant        stenosis in the proximal, mid and distal segments.  *See table(s) above for measurements and observations.  Diagnosing physician: Waverly Ferrari  MD  Electronically signed by Waverly Ferrari MD on 12/26/2022 at 4:52:32 PM.    Final    PERIPHERAL VASCULAR CATHETERIZATION  Result Date: 12/25/2022 See surgical note for result.   Medications: Infusions:  acetaminophen     argatroban 0.17 mcg/kg/min (12/27/22 0636)   dexmedetomidine (PRECEDEX) IV infusion 0.4 mcg/kg/hr (12/27/22 0612)   piperacillin-tazobactam (ZOSYN)  IV Stopped (12/27/22 0443)   TPN ADULT (ION) 85 mL/hr at 12/27/22 0612    Scheduled Medications:  aspirin  300 mg Rectal Daily   Chlorhexidine Gluconate Cloth  6 each Topical Daily   insulin aspart  0-9 Units Subcutaneous Q4H   mouth rinse  15 mL Mouth Rinse Q2H   pantoprazole (PROTONIX) IV  40 mg Intravenous QHS   sodium chloride flush  3 mL Intravenous Q12H    have reviewed scheduled and prn medications.  Physical Exam: General: ill appearing, Intubated Heart:RRR, s1s2 nl Lungs: cta bilateral. intubated Abdomen: dressings in place, distended Extremities:No sig edema Neurology:  unresponsive Dialysis access: RIJ temp HD catheter  Jaliah Foody 12/27/2022,8:12 AM  LOS: 7 days

## 2022-12-27 NOTE — Progress Notes (Signed)
  Progress Note    12/27/2022 8:24 AM 2 Days Post-Op  Subjective:  intubated     Vitals:   12/27/22 0740 12/27/22 0743  BP: 136/78   Pulse: (!) 107   Resp: (!) 24   Temp: 98.1 F (36.7 C)   SpO2: 98% 98%    Physical Exam: General:  intubated and on vent Cardiac:  regular Extremities:  brisk bilateral DP doppler signals Abdomen:  soft, nondistended with dry midline dressing  CBC    Component Value Date/Time   WBC 15.5 (H) 12/27/2022 0455   RBC 3.83 (L) 12/27/2022 0455   HGB 9.8 (L) 12/27/2022 0455   HCT 30.8 (L) 12/27/2022 0455   PLT 169 12/27/2022 0455   MCV 80.4 12/27/2022 0455   MCH 25.6 (L) 12/27/2022 0455   MCHC 31.8 12/27/2022 0455   RDW 14.8 12/27/2022 0455   LYMPHSABS 0.9 10/30/2022 1654   MONOABS 0.6 10/30/2022 1654   EOSABS 0.3 10/30/2022 1654   BASOSABS 0.0 10/30/2022 1654    BMET    Component Value Date/Time   NA 139 12/27/2022 0455   K 3.5 12/27/2022 0455   CL 102 12/27/2022 0455   CO2 21 (L) 12/27/2022 0455   GLUCOSE 157 (H) 12/27/2022 0455   BUN 110 (H) 12/27/2022 0455   CREATININE 7.05 (H) 12/27/2022 0455   CALCIUM 8.3 (L) 12/27/2022 0455   GFRNONAA 8 (L) 12/27/2022 0455   GFRAA >60 04/14/2015 1127    INR No results found for: "INR"   Intake/Output Summary (Last 24 hours) at 12/27/2022 0824 Last data filed at 12/27/2022 9147 Gross per 24 hour  Intake 2440.38 ml  Output 2860 ml  Net -419.62 ml      Assessment/Plan:  67 y.o. male is s/p: ex lap with SMA stenting and small bowel resection    2 Days Post-Op repeat ex lap with further small bowel resection   -Remains intubated and sedated, management per critical care -BLE well perfused with brisk DP doppler signals -Abdomen is soft with dry dressing, care per general surgery -Continue TPN and argatroban -Scr persistently elevated at 7.05. BUN now at 110 this morning. Likely will need CRRT vs HD today. Appreciate Nephrology involvement   Loel Dubonnet, [facility] [phone] 12/27/2022 8:24 AM

## 2022-12-27 NOTE — Progress Notes (Addendum)
ANTICOAGULATION CONSULT NOTE - Follow Up  Pharmacy Consult for heparin >> argatroban Indication: SMA stenosis/occlusion  Allergies  Allergen Reactions   Zestril [Lisinopril] Cough    Patient Measurements: Height: 5\' 11"  (180.3 cm) Weight: 104.8 kg (231 lb 0.7 oz) IBW/kg (Calculated) : 75.3 Heparin Dosing Weight: 94.6kg  Vital Signs: Temp: 99 F (37.2 C) (09/18 0600) Temp Source: Esophageal (09/18 0400) BP: 149/70 (09/18 0600) Pulse Rate: 95 (09/18 0600)  Labs: Recent Labs    12/24/22 1900 12/24/22 1937 12/25/22 0000 12/25/22 0345 12/25/22 0937 12/25/22 1523 12/26/22 0014 12/26/22 0429 12/26/22 1210 12/26/22 1825 12/27/22 0007 12/27/22 0455  HGB  --    < >  --  9.4*  --  10.5*  --  9.4*  --   --   --  9.8*  HCT  --    < >  --  29.0*  --  34.9*  --  30.3*  --   --   --  30.8*  PLT  --    < >  --  152  --  192  --  149*  --   --   --  169  APTT  --   --   --   --   --   --    < > 74*   < > 77* 73* 70*  HEPARINUNFRC <0.10*  --  <0.10*  --  <0.10*  --   --   --   --   --   --   --   CREATININE  --    < >  --  6.97*  --   --   --  7.10*  --   --   --  7.05*   < > = values in this interval not displayed.    Estimated Creatinine Clearance: 12.4 mL/min (A) (by C-G formula based on SCr of 7.05 mg/dL (H)).   Assessment: 43 YOM presenting with abdominal pain found to have mesenteric ischemia. Pharmacy consulted for IV heparin. No AC PTA. Pt s/p ex lap, bowel resection, thrombectomy, and angioplasty 9/12, heparin resumed postop.  Despite multiple rate increases, unable to get heparin to a detectable level, so patient was transitioned to direct thrombin inhibitor argatroban.  aPTT remains supra-therapeutic at 73 seconds.  No bleeding or issues with infusion per RN. Last CBC shows Hgb 10.5 >> 9.4, plts 192 >> 149  Goal of Therapy:  aPTT 50-90 seconds (Aim for 50-70 seconds given recent OR admission) Monitor platelets by anticoagulation protocol: Yes   Plan:  Reduce  argatroban to 0.17 mcg/kg/min  Check repeat aPTT in 4 hrs  Daily CBC and aPTT  Arabella Merles, PharmD. Clinical Pharmacist 12/27/2022 6:13 AM   ADDENDUM Follow up aPTT returned at 70 seconds on 0.17 mcg/kg/min (therapeutic). Per RN, no issues with argatroban infusion. Patient with a little oozing from back and buttocks that is stable. Hgb low, stable, plts 169  Plan:  Continue argatroban to 0.17 mcg/kg/min  aPTT in 4h Daily CBC  Arabella Merles, PharmD. Clinical Pharmacist 12/27/2022 6:13 AM

## 2022-12-27 NOTE — Progress Notes (Signed)
ANTICOAGULATION CONSULT NOTE - Follow Up  Pharmacy Consult for heparin >> argatroban Indication: SMA stenosis/occlusion  Allergies  Allergen Reactions   Zestril [Lisinopril] Cough    Patient Measurements: Height: 5\' 11"  (180.3 cm) Weight: 104.8 kg (231 lb 0.7 oz) IBW/kg (Calculated) : 75.3 Heparin Dosing Weight: 94.6kg  Vital Signs: Temp: 98.4 F (36.9 C) (09/18 1200) Temp Source: Esophageal (09/18 0800) BP: 139/83 (09/18 1103) Pulse Rate: 103 (09/18 1200)  Labs: Recent Labs    12/24/22 1900 12/24/22 1937 12/25/22 0000 12/25/22 0345 12/25/22 7253 12/25/22 1523 12/26/22 0014 12/26/22 0429 12/26/22 1210 12/27/22 0007 12/27/22 0455 12/27/22 0957  HGB  --    < >  --  9.4*  --  10.5*  --  9.4*  --   --  9.8*  --   HCT  --    < >  --  29.0*  --  34.9*  --  30.3*  --   --  30.8*  --   PLT  --    < >  --  152  --  192  --  149*  --   --  169  --   APTT  --   --   --   --   --   --    < > 74*   < > 73* 70* 67*  HEPARINUNFRC <0.10*  --  <0.10*  --  <0.10*  --   --   --   --   --   --   --   CREATININE  --    < >  --  6.97*  --   --   --  7.10*  --   --  7.05*  --    < > = values in this interval not displayed.    Estimated Creatinine Clearance: 12.4 mL/min (A) (by C-G formula based on SCr of 7.05 mg/dL (H)).   Assessment: 31 YOM presenting with abdominal pain found to have mesenteric ischemia. Pharmacy consulted for IV heparin. No AC PTA. Pt s/p ex lap, bowel resection, thrombectomy, and angioplasty 9/12, heparin resumed postop.  Despite multiple rate increases, unable to get heparin to a detectable level, so patient was transitioned to direct thrombin inhibitor argatroban.  aPTT within goal range at 67 on argatroban at 0.17 mcg/kg/min.  No overt bleeding or complications noted.  Goal of Therapy:  aPTT 50-90 seconds (Aim for 50-70 seconds given recent OR admission) Monitor platelets by anticoagulation protocol: Yes   Plan:  Continue argatroban at 0.17 mcg/kg/min   Daily CBC and aPTT  Reece Leader, Colon Flattery, [facility] Clinical Pharmacist  12/27/2022 12:29 PM   [facility] pharmacy phone numbers are listed on [email or web address]

## 2022-12-28 ENCOUNTER — Inpatient Hospital Stay (HOSPITAL_COMMUNITY): Payer: Medicare Other

## 2022-12-28 DIAGNOSIS — K551 Chronic vascular disorders of intestine: Secondary | ICD-10-CM | POA: Diagnosis not present

## 2022-12-28 DIAGNOSIS — J9600 Acute respiratory failure, unspecified whether with hypoxia or hypercapnia: Secondary | ICD-10-CM

## 2022-12-28 LAB — COMPREHENSIVE METABOLIC PANEL
ALT: 22 U/L (ref 0–44)
AST: 46 U/L — ABNORMAL HIGH (ref 15–41)
Albumin: 1.6 g/dL — ABNORMAL LOW (ref 3.5–5.0)
Alkaline Phosphatase: 75 U/L (ref 38–126)
Anion gap: 11 (ref 5–15)
BUN: 104 mg/dL — ABNORMAL HIGH (ref 8–23)
CO2: 25 mmol/L (ref 22–32)
Calcium: 8 mg/dL — ABNORMAL LOW (ref 8.9–10.3)
Chloride: 101 mmol/L (ref 98–111)
Creatinine, Ser: 5.77 mg/dL — ABNORMAL HIGH (ref 0.61–1.24)
GFR, Estimated: 10 mL/min — ABNORMAL LOW (ref >60)
Glucose, Bld: 162 mg/dL — ABNORMAL HIGH (ref 70–99)
Potassium: 3 mmol/L — ABNORMAL LOW (ref 3.5–5.1)
Sodium: 137 mmol/L (ref 135–145)
Total Bilirubin: 1 mg/dL (ref 0.3–1.2)
Total Protein: 5.1 g/dL — ABNORMAL LOW (ref 6.5–8.1)

## 2022-12-28 LAB — GLUCOSE, CAPILLARY
Glucose-Capillary: 158 mg/dL — ABNORMAL HIGH (ref 70–99)
Glucose-Capillary: 155 mg/dL — ABNORMAL HIGH (ref 70–99)
Glucose-Capillary: 164 mg/dL — ABNORMAL HIGH (ref 70–99)
Glucose-Capillary: 169 mg/dL — ABNORMAL HIGH (ref 70–99)

## 2022-12-28 LAB — HEPATITIS B SURFACE ANTIBODY, QUANTITATIVE: Hep B S AB Quant (Post): <3.5 m[IU]/mL — ABNORMAL LOW

## 2022-12-28 LAB — TRIGLYCERIDES: Triglycerides: 92 mg/dL (ref <150)

## 2022-12-28 LAB — APTT: aPTT: 63 seconds — ABNORMAL HIGH (ref 24–36)

## 2022-12-28 LAB — CBC
HCT: 27.3 % — ABNORMAL LOW (ref 39.0–52.0)
Hemoglobin: 8.9 g/dL — ABNORMAL LOW (ref 13.0–17.0)
MCH: 25.7 pg — ABNORMAL LOW (ref 26.0–34.0)
MCHC: 32.6 g/dL (ref 30.0–36.0)
MCV: 78.9 fL — ABNORMAL LOW (ref 80.0–100.0)
Platelets: 180 10*3/uL (ref 150–400)
RBC: 3.46 MIL/uL — ABNORMAL LOW (ref 4.22–5.81)
RDW: 15 % (ref 11.5–15.5)
WBC: 12.5 10*3/uL — ABNORMAL HIGH (ref 4.0–10.5)
nRBC: 0 % (ref 0.0–0.2)

## 2022-12-28 LAB — BASIC METABOLIC PANEL
Anion gap: 14 (ref 5–15)
BUN: 117 mg/dL — ABNORMAL HIGH (ref 8–23)
CO2: 26 mmol/L (ref 22–32)
Calcium: 8.7 mg/dL — ABNORMAL LOW (ref 8.9–10.3)
Chloride: 99 mmol/L (ref 98–111)
Creatinine, Ser: 5.94 mg/dL — ABNORMAL HIGH (ref 0.61–1.24)
GFR, Estimated: 10 mL/min — ABNORMAL LOW (ref >60)
Glucose, Bld: 172 mg/dL — ABNORMAL HIGH (ref 70–99)
Potassium: 3.6 mmol/L (ref 3.5–5.1)
Sodium: 139 mmol/L (ref 135–145)

## 2022-12-28 LAB — PHOSPHORUS: Phosphorus: 2.9 mg/dL (ref 2.5–4.6)

## 2022-12-28 LAB — MISC LABCORP TEST (SEND OUT): Labcorp test code: 6510

## 2022-12-28 LAB — MAGNESIUM: Magnesium: 2 mg/dL (ref 1.7–2.4)

## 2022-12-28 MED ORDER — TRAVASOL 10 % IV SOLN
INTRAVENOUS | Status: AC
  Filled 2022-12-28: qty 1458.2

## 2022-12-28 MED ORDER — ORAL CARE MOUTH RINSE
15.0000 mL | OROMUCOSAL | Status: DC
  Administered 2022-12-29 – 2022-12-31 (×33): 15 mL via OROMUCOSAL

## 2022-12-28 MED ORDER — ORAL CARE MOUTH RINSE: 15.0000 mL | OROMUCOSAL | Status: DC | PRN

## 2022-12-28 MED ORDER — POTASSIUM CHLORIDE 10 MEQ/50ML IV SOLN
10.0000 meq | INTRAVENOUS | Status: AC
  Administered 2022-12-28 (×3): 10 meq via INTRAVENOUS
  Filled 2022-12-28 (×3): qty 50

## 2022-12-28 NOTE — Progress Notes (Signed)
ANTICOAGULATION CONSULT NOTE - Follow Up  Pharmacy Consult for heparin >> argatroban Indication: SMA stenosis/occlusion  Allergies  Allergen Reactions   Zestril [Lisinopril] Cough    Patient Measurements: Height: 5\' 11"  (180.3 cm) Weight: 100 kg (220 lb 7.4 oz) IBW/kg (Calculated) : 75.3 Heparin Dosing Weight: 94.6kg  Vital Signs: Temp: 98.8 F (37.1 C) (09/19 0800) Temp Source: Oral (09/19 0800) BP: 151/83 (09/19 1000) Pulse Rate: 85 (09/19 1000)  Labs: Recent Labs    12/26/22 0429 12/26/22 1210 12/27/22 0455 12/27/22 0957 12/27/22 1242 12/28/22 0419  HGB 9.4*  --  9.8*  --  9.1*  --   HCT 30.3*  --  30.8*  --  28.7*  --   PLT 149*  --  169  --  183  --   APTT 74*   < > 70* 67*  --  63*  CREATININE 7.10*  --  7.05*  --  7.07* 5.77*   < > = values in this interval not displayed.    Estimated Creatinine Clearance: 14.8 mL/min (A) (by C-G formula based on SCr of 5.77 mg/dL (H)).   Assessment: 49 YOM presenting with abdominal pain found to have mesenteric ischemia. Pharmacy consulted for IV heparin. No AC PTA. Pt s/p ex lap, bowel resection, thrombectomy, and angioplasty 9/12, heparin resumed postop.  Despite multiple rate increases, unable to get heparin to a detectable level, so patient was transitioned to direct thrombin inhibitor argatroban.  aPTT within goal range at 63 on argatroban at 0.17 mcg/kg/min.  No overt bleeding or complications noted.  Goal of Therapy:  aPTT 50-90 seconds (Aim for 50-70 seconds given recent OR admission) Monitor platelets by anticoagulation protocol: Yes   Plan:  Continue argatroban at 0.17 mcg/kg/min  Daily CBC and aPTT  Fredonia Highland, PharmD, BCPS, [facility] Clinical Pharmacist [phone] Please check AMION for all [facility] Pharmacy numbers 12/28/2022

## 2022-12-28 NOTE — Progress Notes (Signed)
PHARMACY - TOTAL PARENTERAL NUTRITION CONSULT NOTE  Indication:  SMA syndrome w associated weight loss  Patient Measurements: Height: 5\' 11"  (180.3 cm) Weight: 100 kg (220 lb 7.4 oz) IBW/kg (Calculated) : 75.3 TPN AdjBW (KG): 80.4 Body mass index is 30.75 kg/m. Usual Weight: 95 kg, has lost 50lbs recently   Assessment:  67 yo M admitted with abdominal pain, weight loss, and poor PO intake. 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 He is now s/p SMA stent placement. He was initially left is discontinuity with open abdomen and was closed on 9/16. TPN was initiated 9/14.   Glucose / Insulin: A1c 5.3 - CBGs < 180 Used 8 units SSI in the past 24 hrs. Electrolytes: K down 3 (discussed with HD - plan for high K bath) and Phos trending down 2.9 post HD 9/18 (no Phos in TPN, Ca reduced in TPN on 9/18, CoCa 10.1 (based on 9/16 Alb). Ca x Phos improved at 44, goal < 55). CO2 trend back up 25.  Renal: SCr up 7, BUN up to 110, unresponsive mentally - Nephrology on board, first iHD 9/18, next 9/19. Hepatic: AST trending down, other LFTs/tbili / TG WNL, albumin 1.6.  Intake / Output; MIVF: UOP 1.1 ml/kg/hr (last Lasix 9/14), NG down 195 mL, wound vac output not reported for last 24 hrs. Net I/O improved. LBM 9/8.  GI Imaging: none since TPN initiation  GI Surgeries / Procedures:  9/14 re-ex lap, left in discontinuity with plans for re-exploration  9/16 re-ex lap, small bowel resection with anastomosis x2, abdominal closure Wounds (aside from abdomen): Deep tissue pressure injury to bilateral buttocks - wound care on board  Central access: CVC LIJ 12/23/22 TPN start date: 12/23/22  Nutritional Goals: Goal TPN rate is 98 mL/hr (provides 146g AA and 2304 kCal per day) - adjusted 9/18 for increased estimated needs   RD Estimated Needs Total Energy Estimated Needs: 2300-2500 kcals Total Protein Estimated Needs: 140 -150 g Total Fluid  Estimated Needs: >/= 2.3 L  Current Nutrition:  TPN and Tube feeding at trickle rate only while awaiting bowel function   Plan:  Unable to concentrate TPN due to AA shortage.  Adjust TPN to new goal rate of 98 ml/hr starting at 1800.  Electrolytes in TPN: Na 78mEq/L, K 12 mEq/L (~28 mEq/day), Ca to 5 mEq/L, Mg 5 mEq/L, Phos 0 mmol/L, continue to max acetate.  Add standard MVI and trace elements to TPN. No chromium due to HD and on shortage.  Continue sensitive SSI Q4H for now Monitor TPN labs on Mon/Thurs - labs in AM Follow-up further HD plans.  Monitor bowel function and ability to advance tube feeds.    Link Snuffer, PharmD, BCPS, BCCCP Please refer to [facility] for [facility] Pharmacy numbers 12/28/2022, 9:21 AM

## 2022-12-28 NOTE — Progress Notes (Signed)
[facility] NEPHROLOGY PROGRESS NOTE  Assessment/ Plan: Pt is a 67 y.o. 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   # Acute kidney injury on CKD IIIb: Baseline creatinine level around 2.0.  Acute kidney injury likely ischemic ATN due to ex lap surgery/IV contrast/hypotension -remains non-oliguric. Agree with not intervening on right RAS given that his right kidney is already much smaller than anticipated. Likely not much function being contributed from right. -Given poor mental status despite being off sedation and worsening kidney function, started HD on 9/18 after discussing with patient's wife. Will plan for IHD#2 today -Strict ins and out and close lab monitoring.   # Acute mesenteric ischemia: Status post small bowel resection and superior mesenteric artery stent placement.  Followed by vascular surgeon and currently on agatroban since there was inadequate response to heparin. S/p OR again 9/16 for exlap and further small bowel resection, abd closed   # Vent dependent respiratory failure: Per PCCM. C/w HD for clearance   # Anemia likely due to blood loss: Monitor CBC and transfuse as as per ICU's discretion.   # History of hypertension: BP is currently acceptable  Discussed with ICU RN.  Anthony Sar, MD [facility]  Subjective: Seen and examined at bedside.  Tolerated HD yesterday with net UF 1L. Discussed with RN, did slihgtly better neurologically after HD. Followed some commands. Remains off sedation. Uop 2.7L  Objective Vital signs in last 24 hours: Vitals:   12/28/22 0500 12/28/22 0530 12/28/22 0600 12/28/22 0700  BP: (!) 155/78  (!) 158/91 (!) 148/91  Pulse: 83   95  Resp: (!) 23  (!) 25 (!) 21   Temp: 98.6 F (37 C)  97.9 F (36.6 C) 98.2 F (36.8 C)  TempSrc:      SpO2: 98%   98%  Weight:  100 kg    Height:       Weight change: 2.4 kg  Intake/Output Summary (Last 24 hours) at 12/28/2022 0733 Last data filed at 12/28/2022 0700 Gross per 24 hour  Intake 2773.92 ml  Output 5675 ml  Net -2901.08 ml       Labs: RENAL PANEL Recent Labs  Lab 12/22/22 0458 12/22/22 0501 12/24/22 0500 12/24/22 1937 12/25/22 0345 12/26/22 0429 12/27/22 0455 12/27/22 1242 12/28/22 0419  NA 135   < > 136 136 137 135 139 138 137  K 3.8   < > 3.7 3.6 3.7 4.1 3.5 3.4* 3.0*  CL 103   < > 102 100 105 102 102 102 101  CO2 22   < > 21* 21* 22 21* 21* 23 25  GLUCOSE 141*   < > 126* 127* 123* 153* 157* 162* 162*  BUN 36*   < > 57* 66* 72* 86* 110* 116* 104*  CREATININE 3.32*   < > 6.02* 6.87* 6.97* 7.10* 7.05* 7.07* 5.77*  CALCIUM 8.0*   < > 7.6* 7.8* 7.9* 8.0* 8.3* 8.2* 8.0*  MG 2.0   < > 2.0  --  2.2 2.0 2.1  --  2.0  PHOS  --    < > 5.8*  --  6.0* 6.0* 4.4 4.3 2.9  ALBUMIN 2.3*  --   --  1.8* 1.8*  --   --  1.7* 1.6*   < > =  values in this interval not displayed.    Liver Function Tests: Recent Labs  Lab 12/24/22 1937 12/25/22 0345 12/27/22 1242 12/28/22 0419  AST 72* 79*  --  46*  ALT 29 32  --  22  ALKPHOS 38 45  --  75  BILITOT 0.6 0.7  --  1.0  PROT 4.6* 5.0*  --  5.1*  ALBUMIN 1.8* 1.8* 1.7* 1.6*   No results for input(s): "LIPASE", "AMYLASE" in the last 168 hours. Recent Labs  Lab 12/27/22 1027  AMMONIA 12   CBC: Recent Labs    12/25/22 0345 12/25/22 1523 12/26/22 0429 12/27/22 0455 12/27/22 1242  HGB 9.4* 10.5* 9.4* 9.8* 9.1*  MCV 80.3 81.5 80.2 80.4 79.3*    Cardiac Enzymes: No results for input(s): "CKTOTAL", "CKMB", "CKMBINDEX", "TROPONINI" in the last 168 hours. CBG: Recent Labs  Lab 12/27/22 1137 12/27/22 1559 12/27/22 2019 12/27/22 2336 12/28/22 0436  GLUCAP 146* 148* 157* 150* 158*    Iron Studies: No results for input(s): "IRON",  "TIBC", "TRANSFERRIN", "FERRITIN" in the last 72 hours. Studies/Results: VAS US RENAL ARTERY DUPLEX  Result Date: 12/26/2022 ABDOMINAL VISCERAL Patient Name:  [name] [name]  Date of Exam:   12/26/2022 Medical Rec #: [id number]      Accession #:    [id number] Date of Birth: 10-24-1955      Patient Gender: M Patient Age:   30 years Exam Location:  [facility] Procedure:      VAS US RENAL ARTERY DUPLEX Referring Phys: [id number] JOSHUA E ROBINS -------------------------------------------------------------------------------- Indications: Concerned for right renal artery occlusion. High Risk Factors: Hypertension, hyperlipidemia. Vascular Interventions: SMA stenting for acute mesenteric ischemia on 12/23/22. Limitations: Air/bowel gas, obesity and BANDAGES IN PLACE FROM SURGERY ON 12/25/22 - RE-EXPLORATION LAPAROTOMY W/CLOSURE (Abdomen) SMALL BOWEL RESECTION W/ANASTOMOSIS. Comparison Study: Renal duplex on 12/05/22 - Patent renal arteries bilaterally                   with small right kidney. Performing Technologist: Erin Hearing  Examination Guidelines: A complete evaluation includes B-mode imaging, spectral Doppler, color Doppler, and power Doppler as needed of all accessible portions of each vessel. Bilateral testing is considered an integral part of a complete examination. Limited examinations for reoccurring indications may be performed as noted.  Duplex Findings:  Mesenteric Technologist observations: Aorta and mesenteric and celiac arteries not visualized due to s/p surgery.   +------------------+--------+--------+--------------+ Right Renal ArteryPSV cm/sEDV cm/s   Comment     +------------------+--------+--------+--------------+ Origin                            Not visualized +------------------+--------+--------+--------------+ Proximal             54      24                  +------------------+--------+--------+--------------+ Mid                  53      22                   +------------------+--------+--------+--------------+ Distal               64      29                  +------------------+--------+--------+--------------+ +-----------------+--------+--------+-------------------+ Left Renal ArteryPSV cm/sEDV cm/s      Comment       +-----------------+--------+--------+-------------------+ Origin  73      15   not well visualized +-----------------+--------+--------+-------------------+ Proximal            93      18                       +-----------------+--------+--------+-------------------+ Mid                 96      13                       +-----------------+--------+--------+-------------------+ Distal              57      13                       +-----------------+--------+--------+-------------------+ +------------+--------+--------+----+-----------+--------+--------+----+ Right KidneyPSV cm/sEDV cm/sRI  Left KidneyPSV cm/sEDV cm/sRI   +------------+--------+--------+----+-----------+--------+--------+----+ Upper Pole  10      6       0.35Upper Pole 21      7       0.66 +------------+--------+--------+----+-----------+--------+--------+----+ Mid         19      9       0.        23      7       0.71 +------------+--------+--------+----+-----------+--------+--------+----+ Lower Pole  0       0           Lower Pole 18      7       0.60 +------------+--------+--------+----+-----------+--------+--------+----+ Hilar       29      11      0.62Hilar      24      8       0.66 +------------+--------+--------+----+-----------+--------+--------+----+ +------------------+-----+------------------+-----+ Right Kidney           Left Kidney             +------------------+-----+------------------+-----+ RAR                    RAR                     +------------------+-----+------------------+-----+ RAR (manual)           RAR (manual)             +------------------+-----+------------------+-----+ Cortex                 Cortex                  +------------------+-----+------------------+-----+ Cortex thickness       Corex thickness         +------------------+-----+------------------+-----+ Kidney length (cm)10.24Kidney length (cm)14.60 +------------------+-----+------------------+-----+  Summary: Renal:  Right: Patent right renal artery with abnormal tardus-parvus        waveforms which is indicative of high grade proximal        stenosis. Right kidney is smaller in size when compared to        the left with increased echogenicity. Left:  Patent left renal artery without evidence of a significant        stenosis in the proximal, mid and distal segments.  *See table(s) above for measurements and observations.  Diagnosing physician: Waverly Ferrari MD  Electronically signed by Waverly Ferrari MD on 12/26/2022 at 4:52:32 PM.    Final     Medications: Infusions:  anticoagulant sodium citrate     argatroban  0.17 mcg/kg/min (12/28/22 0700)   piperacillin-tazobactam (ZOSYN)  IV Stopped (12/28/22 0438)   TPN ADULT (ION) 98 mL/hr at 12/28/22 0700    Scheduled Medications:  aspirin  300 mg Rectal Daily   Chlorhexidine Gluconate Cloth  6 each Topical Daily   Chlorhexidine Gluconate Cloth  6 each Topical Q0600   insulin aspart  0-9 Units Subcutaneous Q4H   mouth rinse  15 mL Mouth Rinse Q2H   pantoprazole (PROTONIX) IV  40 mg Intravenous QHS   sodium chloride flush  3 mL Intravenous Q12H    have reviewed scheduled and prn medications.  Physical Exam: General: ill appearing, Intubated Heart:RRR, s1s2 nl Lungs: cta bilateral. intubated Abdomen: dressings in place, distended Extremities:No sig edema Neurology: unresponsive Dialysis access: RIJ temp HD catheter  Georgeanna Radziewicz 12/28/2022,7:33 AM  LOS: 8 days

## 2022-12-28 NOTE — Progress Notes (Addendum)
[facility] Progress Note  3 Days Post-Op  Subjective: CC:  Intubated. Following commands but remains somnolent. Plans for additional HD today. NG 1600 mL overnight  Afebrile,  Objective: Vital signs in last 24 hours: Temp:  [97.2 F (36.2 C)-98.8 F (37.1 C)] 98.2 F (36.8 C) (09/19 0700) Pulse Rate:  [78-109] 95 (09/19 0700) Resp:  [18-26] 21 (09/19 0700) BP: (118-170)/(71-108) 148/91 (09/19 0700) SpO2:  [97 %-99 %] 98 % (09/19 0700) FiO2 (%):  [40 %] 40 % (09/19 0400) Weight:  [100 kg-107.2 kg] 100 kg (09/19 0530) Last BM Date : 12/17/22  Intake/Output from previous day: 09/18 0701 - 09/19 0700 In: 2773.9 [I.V.:2243.8; IV Piggyback:530.2] Out: 5675 [Urine:2710; Emesis/NG output:1965] Intake/Output this shift: No intake/output data recorded.  PE: Gen:  intubated Card:  Regular rate and rhythm Pulm:  ventilated respirations Abd: NG in left nare - tubing with bilious effluent and some dark solid sediment. Cannister looks like coffee grounds, no frank blood. Soft, slight interval increase in abdominal distention, midline wound c/d/I - wound bed pale but viable  GU: Clear yellow urine in foley bag Skin: warm and dry Psych:unable to assess  Lab Results:  Recent Labs    12/27/22 0455 12/27/22 1242  WBC 15.5* 13.5*  HGB 9.8* 9.1*  HCT 30.8* 28.7*  PLT 169 183   BMET Recent Labs    12/27/22 1242 12/28/22 0419  NA 138 137  K 3.4* 3.0*  CL 102 101  CO2 23 25  GLUCOSE 162* 162*  BUN 116* 104*  CREATININE 7.07* 5.77*  CALCIUM 8.2* 8.0*   PT/INR No results for input(s): "LABPROT", "INR" in the last 72 hours. CMP     Component Value Date/Time   NA 137 12/28/2022 0419   K 3.0 (L) 12/28/2022 0419   CL 101 12/28/2022 0419   CO2 25 12/28/2022 0419   GLUCOSE 162 (H) 12/28/2022 0419   BUN 104 (H) 12/28/2022 0419   CREATININE 5.77 (H) 12/28/2022 0419   CALCIUM 8.0 (L) 12/28/2022 0419   PROT 5.1 (L) 12/28/2022 0419   PROT 6.3 03/16/2020 0800    ALBUMIN 1.6 (L) 12/28/2022 0419   ALBUMIN 4.3 03/16/2020 0800   AST 46 (H) 12/28/2022 0419   ALT 22 12/28/2022 0419   ALKPHOS 75 12/28/2022 0419   BILITOT 1.0 12/28/2022 0419   BILITOT 0.8 03/16/2020 0800   GFRNONAA 10 (L) 12/28/2022 0419   GFRAA >60 04/14/2015 1127   Lipase     Component Value Date/Time   LIPASE 28 11/27/2022 0755       Studies/Results: VAS US RENAL ARTERY DUPLEX  Result Date: 12/26/2022 ABDOMINAL VISCERAL Patient Name:  [name] [name]  Date of Exam:   12/26/2022 Medical Rec #: [id number]      Accession #:    [id number] Date of Birth: 11-27-1955      Patient Gender: M Patient Age:   67 years Exam [facility]  [facility] Procedure:      VAS US RENAL ARTERY DUPLEX Referring Phys: [id number] JOSHUA E ROBINS -------------------------------------------------------------------------------- Indications: Concerned for right renal artery occlusion. High Risk Factors: Hypertension, hyperlipidemia. Vascular Interventions: SMA stenting for acute mesenteric ischemia on 12/23/22. Limitations: Air/bowel gas, obesity and BANDAGES IN PLACE FROM SURGERY ON 12/25/22 - RE-EXPLORATION LAPAROTOMY W/CLOSURE (Abdomen) SMALL BOWEL RESECTION W/ANASTOMOSIS. Comparison Study: Renal duplex on 12/05/22 - Patent renal arteries bilaterally                   with small right kidney. Performing Technologist:  Erin Hearing  Examination Guidelines: A complete evaluation includes B-mode imaging, spectral Doppler, color Doppler, and power Doppler as needed of all accessible portions of each vessel. Bilateral testing is considered an integral part of a complete examination. Limited examinations for reoccurring indications may be performed as noted.  Duplex Findings:  Mesenteric Technologist observations: Aorta and mesenteric and celiac arteries not visualized due to s/p surgery.   +------------------+--------+--------+--------------+ Right Renal ArteryPSV cm/sEDV cm/s   Comment      +------------------+--------+--------+--------------+ Origin                            Not visualized +------------------+--------+--------+--------------+ Proximal             54      24                  +------------------+--------+--------+--------------+ Mid                  53      22                  +------------------+--------+--------+--------------+ Distal               64      29                  +------------------+--------+--------+--------------+ +-----------------+--------+--------+-------------------+ Left Renal ArteryPSV cm/sEDV cm/s      Comment       +-----------------+--------+--------+-------------------+ Origin              73      15   not well visualized +-----------------+--------+--------+-------------------+ Proximal            93      18                       +-----------------+--------+--------+-------------------+ Mid                 96      13                       +-----------------+--------+--------+-------------------+ Distal              57      13                       +-----------------+--------+--------+-------------------+ +------------+--------+--------+----+-----------+--------+--------+----+ Right KidneyPSV cm/sEDV cm/sRI  Left KidneyPSV cm/sEDV cm/sRI   +------------+--------+--------+----+-----------+--------+--------+----+ Upper Pole  10      6       0.35Upper Pole 21      7       0.66 +------------+--------+--------+----+-----------+--------+--------+----+ Mid         19      9       0.        23      7       0.71 +------------+--------+--------+----+-----------+--------+--------+----+ Lower Pole  0       0           Lower Pole 18      7       0.60 +------------+--------+--------+----+-----------+--------+--------+----+ Hilar       29      11      0.62Hilar      24      8       0.66 +------------+--------+--------+----+-----------+--------+--------+----+  +------------------+-----+------------------+-----+ Right Kidney           Left Kidney             +------------------+-----+------------------+-----+  RAR                    RAR                     +------------------+-----+------------------+-----+ RAR (manual)           RAR (manual)            +------------------+-----+------------------+-----+ Cortex                 Cortex                  +------------------+-----+------------------+-----+ Cortex thickness       Corex thickness         +------------------+-----+------------------+-----+ Kidney length (cm)10.24Kidney length (cm)14.60 +------------------+-----+------------------+-----+  Summary: Renal:  Right: Patent right renal artery with abnormal tardus-parvus        waveforms which is indicative of high grade proximal        stenosis. Right kidney is smaller in size when compared to        the left with increased echogenicity. Left:  Patent left renal artery without evidence of a significant        stenosis in the proximal, mid and distal segments.  *See table(s) above for measurements and observations.  Diagnosing physician: Waverly Ferrari MD  Electronically signed by Waverly Ferrari MD on 12/26/2022 at 4:52:32 PM.    Final     Anti-infectives: Anti-infectives (From admission, onward)    Start     Dose/Rate Route Frequency Ordered Stop   12/23/22 1230  piperacillin-tazobactam (ZOSYN) IVPB 2.25 g        2.25 g 100 mL/hr over 30 Minutes Intravenous Every 8 hours 12/23/22 1139 12/28/22 2359   12/22/22 0000  piperacillin-tazobactam (ZOSYN) IVPB 3.375 g  Status:  Discontinued        3.375 g 12.5 mL/hr over 240 Minutes Intravenous Every 8 hours 12/21/22 2331 12/23/22 1139        Assessment/Plan 67 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 Karin Lieu, exploratory laparotomy and small bowel resection Dr. Janee Morn S/p re-exploration, replace abthera FB  12/23/22   S/p re-exploration, small bowel resection with anastomosis x2, assessment of bowel perfusion with ICG, abdominal closure 9/16 Dr. Andrey Campanile - POD#3 - afebrile, VSS, WBC 15 yesterday -  post-op ileus. await bowel function - continue NG to LIWS. KUB to confirm position of NG tube and bowel gas patter. Continue protonix.  - remains on rectal ASA , switched to argatroban due to inability to get heparin up to a detectable level  - Cr 5.77 from 7.05, noted plans for HD today  - continue Zosyn, would continue for 3 days (stop date 9/19 @ 2359), stop tonight  - TPN per pharmacy - CCS will follow closely    LOS: 8 days   I reviewed nursing notes, Consultant nephrology, vascular notes, hospitalist notes, last 24 h vitals and pain scores, last 48 h intake and output, last 24 h labs and trends, and last 24 h imaging results.    Hosie Spangle, PA-C [facility] Please see Amion for pager number during day hours 7:00am-4:30pm

## 2022-12-28 NOTE — Progress Notes (Signed)
NAME:  [name] [name], MRN:  [id number], DOB:  1955/05/02, LOS: 8 ADMISSION DATE:  12/20/2022, CONSULTATION DATE: 9/12 REFERRING MD: Dr. Jacqulyn Bath, CHIEF COMPLAINT: Mesenteric ischemia  History of Present Illness:   67 year old male with past medical history as below, which is significant for intestinal stromal tumor removal in 2022, known mesenteric stenosis, GERD, hypertension, and hyperlipidemia.  In the few weeks prior to presentation on 9/11 he had been evaluated at several campuses for abdominal pain with noncontrasted CTs of the abdomen which were unrevealing.  He continued to have abdominal pain, weight loss, and poor p.o. intake.  He presented [name] on 9/11 and was admitted to the hospitalist for abdominal pain with concern for superior mesenteric artery thrombus.  He was started on heparin infusion.  Vascular surgery was consulted and ultimately a CT angiogram of the abdomen and pelvis was done 9/12 demonstrating acute occlusion of the distal SMA as well as occlusion of the inferior mesenteric artery.  He was taken to the operating room emergently and and was found to have an necrotic area of the jejunum.  This was resected, and SMA stent was placed, and the patient was left with an open abdomen.  He also remained on the mechanical ventilator postoperatively and PCCM was asked to admit for ventilator management.  Pertinent  Medical History   has a past medical history of Ankle fracture, Cancer (HCC) (1974), Cataract, GERD (gastroesophageal reflux disease), colonic polyps, Hyperlipidemia, Hypertension, Kidney stones, Patella fracture, Retinal detachment, Retinal tear of right eye, and Syncopal episodes (2008).   Significant Hospital Events: Including procedures, antibiotic start and stop dates in addition to other pertinent events   9/11 admitted with abdominal pain 9/12 CT demonstrating mesenteric occlusion, bowel resection and stent placed, to ICU on vent. Left in discontinuity  9/14  back to OR. Left in discontinuity as still concern about bowel integrity 9/16 back to OR  s sig areas of ischemia w/ 2 areas of perforation, another 66cm section of jejunum was resected and area of discontinuity was reanastomosed. Changed to argatroban post-op as unable to get heparin levels detectable  9/17 dropping RASS goal to 0 to -1, assessing for readiness to wean, serum creatinine and BUN still climbing some but making urine 9/18 iHD   Interim History / Subjective:  iHD yesterday UOP 2.7 L last 24 hours On PSV A little more awake today following some commands and opens eyes to voice  Objective   Blood pressure 124/85, pulse 88, temperature 98.8 F (37.1 C), temperature source Oral, resp. rate (!) 21, height 5\' 11"  (1.803 m), weight 100 kg, SpO2 98%. CVP:  [3 mmHg-12 mmHg] 3 mmHg  Vent Mode: PRVC FiO2 (%):  [40 %] 40 % Set Rate:  [15 bmp] 15 bmp Vt Set:  [600 mL] 600 mL PEEP:  [5 cmH20] 5 cmH20 Pressure Support:  [10 cmH20] 10 cmH20 Plateau Pressure:  [17 cmH20] 17 cmH20   Intake/Output Summary (Last 24 hours) at 12/28/2022 0923 Last data filed at 12/28/2022 0800 Gross per 24 hour  Intake 2714.28 ml  Output 5395 ml  Net -2680.72 ml   Filed Weights   12/27/22 1600 12/27/22 1833 12/28/22 0530  Weight: 107.2 kg 106.2 kg 100 kg    Examination:  General: critically ill appearing on mech vent HEENT: MM pink/moist; ETT in place Neuro: a little more awake today opens eyes to voice and following some commands; MAE; PERRL CV: s1s2, RRR, no m/r/g PULM:  dim clear BS bilaterally; on mech  vent PSV GI: soft, absent BS; wound dressing in place Extremities: warm/dry, no edema  Skin: no rashes or lesions   Resolved Hospital Problem list     Assessment & Plan:   Acute mesenteric ischemia: Status post small bowel resection and superior mesenteric artery stent placement 9/12.  Open abdomen, left in discontinuity since 9/12, another 66 cm of jejunum was resected on 9/16 prior to  successful reanastomosis of the small bowel Plan: -CCS and VVS following; appreciate recs -cont TPN -argatroban gtt -wound care per surgery -zosyn to stop today -pain management  Postoperative ventilator management-  Plan: -psv during day as tolerated; patient's mental status slowly improving; will hold on extubation at least another day  -rest on PRVC overnight -Wean PEEP/FiO2 for SpO2 >92% -VAP bundle in place -Daily SAT and SBT -PAD protocol in place -wean sedation for RASS goal 0 to -1  Acute encephalopathy: likely uremia and sedation related Plan: -off sedation; limit sedating meds -mental status slowly improved after HD; plan to repeat HD today  Intermittent tachycardia and hypertension Plan: -pain management -prn metoprolol  AKI on CKD3: in context of mesenteric ischemia, fluid shifts and likely ATN.   His BUN continues to climb acidosis creatinine Hypokalemia Plan: -replete K -nephro following; plan to repeat iHD again today -Trend BMP / urinary output -Replace electrolytes as indicated -Avoid nephrotoxic agents, ensure adequate renal perfusion  Hyperglycemia Plan: -ssi and cbg monitoring  Postop anemia- expected in context of blood losses from surgery and bone marrow stunning in context of critical illness, hgb stable  Plan: -trend cbc  Protein calorie malnutrition : significant weight loss prior to this admission, now NPO Plan: -TPN  Sacral decub/deep tissue wound. Still not at place where can be staged Plan: -WOC following  Hx HTN, cataracts, GERD, HLD Plan: -resume home statin and start on antihypertensives when able to use gut  Best Practice (right click and "Reselect all SmartList Selections" daily)   Diet/type: TPN DVT prophylaxis: argatroban GI prophylaxis: PPI Lines: Central line and Dialysis Catheter Foley:  Yes, and it is still needed Code Status:  full code Last date of multidisciplinary goals of care discussion [updated wife at  bedside 9/18]   My time 35 minutes  JD Anselm Lis [facility] 12/28/2022, 9:44 AM  Please see [email or web address] for pager details.  From 7A-7P if no response, please call [phone]. After hours, please call ELink [phone].

## 2022-12-28 NOTE — Progress Notes (Addendum)
  Progress Note    12/28/2022 7:57 AM 3 Days Post-Op  Subjective:  remains intubated. Slight neurological improvement after HD yesterday    Vitals:   12/28/22 0600 12/28/22 0700  BP: (!) 158/91 (!) 148/91  Pulse:  95  Resp: (!) 25 (!) 21  Temp: 97.9 F (36.6 C) 98.2 F (36.8 C)  SpO2:  98%    Physical Exam: General:  nonsedated, intubated. sleeping Cardiac:  regular Lungs:  ventilated  Incisions:  abdominal incision bandaged and dry Extremities:  brisk bilateral DP doppler signals Abdomen:  soft, NT, ND  CBC    Component Value Date/Time   WBC 13.5 (H) 12/27/2022 1242   RBC 3.62 (L) 12/27/2022 1242   HGB 9.1 (L) 12/27/2022 1242   HCT 28.7 (L) 12/27/2022 1242   PLT 183 12/27/2022 1242   MCV 79.3 (L) 12/27/2022 1242   MCH 25.1 (L) 12/27/2022 1242   MCHC 31.7 12/27/2022 1242   RDW 14.8 12/27/2022 1242   LYMPHSABS 0.9 10/30/2022 1654   MONOABS 0.6 10/30/2022 1654   EOSABS 0.3 10/30/2022 1654   BASOSABS 0.0 10/30/2022 1654    BMET    Component Value Date/Time   NA 137 12/28/2022 0419   K 3.0 (L) 12/28/2022 0419   CL 101 12/28/2022 0419   CO2 25 12/28/2022 0419   GLUCOSE 162 (H) 12/28/2022 0419   BUN 104 (H) 12/28/2022 0419   CREATININE 5.77 (H) 12/28/2022 0419   CALCIUM 8.0 (L) 12/28/2022 0419   GFRNONAA 10 (L) 12/28/2022 0419   GFRAA >60 04/14/2015 1127    INR No results found for: "INR"   Intake/Output Summary (Last 24 hours) at 12/28/2022 0757 Last data filed at 12/28/2022 0700 Gross per 24 hour  Intake 2773.92 ml  Output 5675 ml  Net -2901.08 ml      Assessment/Plan:  67 y.o. male is s/p: ex lap with SMA stenting and small bowel resection   3 days post op: repeat ex lap with further small bowel resection  -Remains intubated and on vent. Plans for extubation once mental status improves. Neurological status slightly improved after HD yesterday -BLE well perfused with brisk DP doppler signals -Abdomen is soft with a dry bandage -Continue  TPN and argatroban -Tolerated first session of HD yesterday. BUN 104, plans for repeat HD again today   Loel Dubonnet, PA-C [facility] [phone] 12/28/2022 7:57 AM  VASCULAR STAFF ADDENDUM: I have independently interviewed and examined the patient. I agree with the above.  Remains intubated. iHD for mental status.  Continues to make urine.  Will continue to follow. Extubate when able.    Victorino Sparrow MD [facility] Phone Number: [phone] 12/28/2022 3:19 PM

## 2022-12-29 ENCOUNTER — Inpatient Hospital Stay (HOSPITAL_COMMUNITY): Payer: Medicare Other

## 2022-12-29 DIAGNOSIS — K551 Chronic vascular disorders of intestine: Secondary | ICD-10-CM | POA: Diagnosis not present

## 2022-12-29 LAB — POCT I-STAT 7, (LYTES, BLD GAS, ICA,H+H)
Acid-Base Excess: 2 mmol/L (ref 0.0–2.0)
Bicarbonate: 25 mmol/L (ref 20.0–28.0)
Calcium, Ion: 1.06 mmol/L — ABNORMAL LOW (ref 1.15–1.40)
HCT: 29 % — ABNORMAL LOW (ref 39.0–52.0)
Hemoglobin: 9.9 g/dL — ABNORMAL LOW (ref 13.0–17.0)
O2 Saturation: 100 %
Patient temperature: 98.6
Potassium: 3.7 mmol/L (ref 3.5–5.1)
Sodium: 136 mmol/L (ref 135–145)
TCO2: 26 mmol/L (ref 22–32)
pCO2 arterial: 33.9 mmHg (ref 32–48)
pH, Arterial: 7.475 — ABNORMAL HIGH (ref 7.35–7.45)
pO2, Arterial: 180 mmHg — ABNORMAL HIGH (ref 83–108)

## 2022-12-29 LAB — CBC
HCT: 24.6 % — ABNORMAL LOW (ref 39.0–52.0)
HCT: 27.1 % — ABNORMAL LOW (ref 39.0–52.0)
HCT: 28.9 % — ABNORMAL LOW (ref 39.0–52.0)
Hemoglobin: 7.8 g/dL — ABNORMAL LOW (ref 13.0–17.0)
Hemoglobin: 8.9 g/dL — ABNORMAL LOW (ref 13.0–17.0)
Hemoglobin: 9.3 g/dL — ABNORMAL LOW (ref 13.0–17.0)
MCH: 25.7 pg — ABNORMAL LOW (ref 26.0–34.0)
MCH: 26.8 pg (ref 26.0–34.0)
MCH: 25.8 pg — ABNORMAL LOW (ref 26.0–34.0)
MCHC: 31.7 g/dL (ref 30.0–36.0)
MCHC: 32.8 g/dL (ref 30.0–36.0)
MCHC: 32.2 g/dL (ref 30.0–36.0)
MCV: 80.9 fL (ref 80.0–100.0)
MCV: 81.6 fL (ref 80.0–100.0)
MCV: 80.3 fL (ref 80.0–100.0)
Platelets: 240 10*3/uL (ref 150–400)
Platelets: 223 10*3/uL (ref 150–400)
Platelets: 243 10*3/uL (ref 150–400)
RBC: 3.04 MIL/uL — ABNORMAL LOW (ref 4.22–5.81)
RBC: 3.32 MIL/uL — ABNORMAL LOW (ref 4.22–5.81)
RBC: 3.6 MIL/uL — ABNORMAL LOW (ref 4.22–5.81)
RDW: 14.9 % (ref 11.5–15.5)
RDW: 15.2 % (ref 11.5–15.5)
RDW: 15.3 % (ref 11.5–15.5)
WBC: 28.1 10*3/uL — ABNORMAL HIGH (ref 4.0–10.5)
WBC: 26.1 10*3/uL — ABNORMAL HIGH (ref 4.0–10.5)
WBC: 31.7 10*3/uL — ABNORMAL HIGH (ref 4.0–10.5)
nRBC: 0 % (ref 0.0–0.2)
nRBC: 0 % (ref 0.0–0.2)
nRBC: 0 % (ref 0.0–0.2)

## 2022-12-29 LAB — APTT
aPTT: 89 seconds — ABNORMAL HIGH (ref 24–36)
aPTT: 47 seconds — ABNORMAL HIGH (ref 24–36)

## 2022-12-29 LAB — RENAL FUNCTION PANEL
Albumin: 1.8 g/dL — ABNORMAL LOW (ref 3.5–5.0)
Albumin: 1.7 g/dL — ABNORMAL LOW (ref 3.5–5.0)
Anion gap: 16 — ABNORMAL HIGH (ref 5–15)
Anion gap: 14 (ref 5–15)
BUN: 84 mg/dL — ABNORMAL HIGH (ref 8–23)
BUN: 85 mg/dL — ABNORMAL HIGH (ref 8–23)
CO2: 24 mmol/L (ref 22–32)
CO2: 26 mmol/L (ref 22–32)
Calcium: 8 mg/dL — ABNORMAL LOW (ref 8.9–10.3)
Calcium: 7.9 mg/dL — ABNORMAL LOW (ref 8.9–10.3)
Chloride: 96 mmol/L — ABNORMAL LOW (ref 98–111)
Chloride: 96 mmol/L — ABNORMAL LOW (ref 98–111)
Creatinine, Ser: 4.17 mg/dL — ABNORMAL HIGH (ref 0.61–1.24)
Creatinine, Ser: 3.57 mg/dL — ABNORMAL HIGH (ref 0.61–1.24)
GFR, Estimated: 15 mL/min — ABNORMAL LOW (ref >60)
GFR, Estimated: 18 mL/min — ABNORMAL LOW (ref >60)
Glucose, Bld: 247 mg/dL — ABNORMAL HIGH (ref 70–99)
Glucose, Bld: 215 mg/dL — ABNORMAL HIGH (ref 70–99)
Phosphorus: 3.3 mg/dL (ref 2.5–4.6)
Phosphorus: 2.9 mg/dL (ref 2.5–4.6)
Potassium: 3.6 mmol/L (ref 3.5–5.1)
Potassium: 4.2 mmol/L (ref 3.5–5.1)
Sodium: 136 mmol/L (ref 135–145)
Sodium: 136 mmol/L (ref 135–145)

## 2022-12-29 LAB — HEMOGLOBIN A1C
Hgb A1c MFr Bld: 6.3 % — ABNORMAL HIGH (ref 4.8–5.6)
Mean Plasma Glucose: 134.11 mg/dL

## 2022-12-29 LAB — GLUCOSE, CAPILLARY
Glucose-Capillary: 162 mg/dL — ABNORMAL HIGH (ref 70–99)
Glucose-Capillary: 168 mg/dL — ABNORMAL HIGH (ref 70–99)
Glucose-Capillary: 187 mg/dL — ABNORMAL HIGH (ref 70–99)

## 2022-12-29 LAB — LACTIC ACID, PLASMA
Lactic Acid, Venous: 1.4 mmol/L (ref 0.5–1.9)
Lactic Acid, Venous: 1.6 mmol/L (ref 0.5–1.9)

## 2022-12-29 LAB — CG4 I-STAT (LACTIC ACID): Lactic Acid, Venous: 1.5 mmol/L (ref 0.5–1.9)

## 2022-12-29 LAB — PREPARE RBC (CROSSMATCH)

## 2022-12-29 LAB — PROTIME-INR
INR: 1.7 — ABNORMAL HIGH (ref 0.8–1.2)
Prothrombin Time: 20.1 seconds — ABNORMAL HIGH (ref 11.4–15.2)

## 2022-12-29 MED ORDER — INSULIN ASPART 100 UNIT/ML IJ SOLN
0.0000 [IU] | INTRAMUSCULAR | Status: DC
  Administered 2022-12-29: 3 [IU] via SUBCUTANEOUS
  Administered 2022-12-29: 5 [IU] via SUBCUTANEOUS
  Administered 2022-12-29 (×2): 3 [IU] via SUBCUTANEOUS
  Administered 2022-12-30: 5 [IU] via SUBCUTANEOUS

## 2022-12-29 MED ORDER — HYDROCORTISONE SOD SUC (PF) 100 MG IJ SOLR
100.0000 mg | Freq: Three times a day (TID) | INTRAMUSCULAR | Status: DC
  Administered 2022-12-29 – 2022-12-31 (×5): 100 mg via INTRAVENOUS
  Filled 2022-12-29 (×6): qty 2

## 2022-12-29 MED ORDER — ALBUMIN HUMAN 5 % IV SOLN: 12.5000 g | INTRAVENOUS | Status: DC | PRN

## 2022-12-29 MED ORDER — SODIUM CHLORIDE 0.9 % IV SOLN
1.0000 g | Freq: Three times a day (TID) | INTRAVENOUS | Status: AC
  Administered 2022-12-29 – 2023-01-03 (×16): 1 g via INTRAVENOUS
  Filled 2022-12-29 (×16): qty 20

## 2022-12-29 MED ORDER — PRISMASOL BGK 4/2.5 32-4-2.5 MEQ/L REPLACEMENT SOLN: Status: DC

## 2022-12-29 MED ORDER — VASOPRESSIN 20 UNITS/100 ML INFUSION FOR SHOCK
0.0400 [IU]/min | INTRAVENOUS | Status: DC
  Administered 2022-12-29 – 2022-12-31 (×5): 0.04 [IU]/min via INTRAVENOUS
  Filled 2022-12-29 (×6): qty 100

## 2022-12-29 MED ORDER — PANTOPRAZOLE SODIUM 40 MG IV SOLR
40.0000 mg | Freq: Two times a day (BID) | INTRAVENOUS | Status: DC
  Administered 2022-12-29 – 2023-01-24 (×52): 40 mg via INTRAVENOUS
  Filled 2022-12-29 (×52): qty 10

## 2022-12-29 MED ORDER — ORAL CARE MOUTH RINSE: 15.0000 mL | OROMUCOSAL | Status: DC

## 2022-12-29 MED ORDER — FENTANYL 2500MCG IN NS 250ML (10MCG/ML) PREMIX INFUSION
0.0000 ug/h | INTRAVENOUS | Status: DC
  Administered 2022-12-29: 250 ug/h via INTRAVENOUS
  Administered 2022-12-29: 100 ug/h via INTRAVENOUS
  Administered 2022-12-30 (×2): 250 ug/h via INTRAVENOUS
  Administered 2022-12-30 – 2022-12-31 (×2): 300 ug/h via INTRAVENOUS
  Filled 2022-12-29 (×5): qty 250

## 2022-12-29 MED ORDER — AMIODARONE HCL IN DEXTROSE 360-4.14 MG/200ML-% IV SOLN
60.0000 mg/h | INTRAVENOUS | Status: AC
  Administered 2022-12-29 (×2): 60 mg/h via INTRAVENOUS
  Filled 2022-12-29: qty 200

## 2022-12-29 MED ORDER — AMIODARONE LOAD VIA INFUSION
150.0000 mg | Freq: Once | INTRAVENOUS | Status: AC
  Administered 2022-12-29: 150 mg via INTRAVENOUS
  Filled 2022-12-29: qty 83.34

## 2022-12-29 MED ORDER — BISACODYL 10 MG RE SUPP
10.0000 mg | Freq: Every day | RECTAL | Status: DC | PRN
  Administered 2022-12-29: 10 mg via RECTAL
  Filled 2022-12-29: qty 1

## 2022-12-29 MED ORDER — CALCIUM GLUCONATE-NACL 2-0.675 GM/100ML-% IV SOLN
2.0000 g | Freq: Once | INTRAVENOUS | Status: AC
  Administered 2022-12-29: 2000 mg via INTRAVENOUS
  Filled 2022-12-29: qty 100

## 2022-12-29 MED ORDER — VANCOMYCIN HCL IN DEXTROSE 1-5 GM/200ML-% IV SOLN: 1000.0000 mg | Freq: Two times a day (BID) | INTRAVENOUS | Status: DC

## 2022-12-29 MED ORDER — AMIODARONE HCL IN DEXTROSE 360-4.14 MG/200ML-% IV SOLN: 30.0000 mg/h | INTRAVENOUS | Status: DC

## 2022-12-29 MED ORDER — SODIUM CHLORIDE 0.9 % IV SOLN: 1.0000 g | Freq: Two times a day (BID) | INTRAVENOUS | Status: DC

## 2022-12-29 MED ORDER — PRISMASOL BGK 4/2.5 32-4-2.5 MEQ/L EC SOLN: Status: DC

## 2022-12-29 MED ORDER — VANCOMYCIN HCL IN DEXTROSE 1-5 GM/200ML-% IV SOLN
1000.0000 mg | INTRAVENOUS | Status: AC
  Administered 2022-12-30 – 2023-01-03 (×5): 1000 mg via INTRAVENOUS
  Filled 2022-12-29 (×5): qty 200

## 2022-12-29 MED ORDER — MAGNESIUM SULFATE 2 GM/50ML IV SOLN
2.0000 g | Freq: Once | INTRAVENOUS | Status: AC
  Administered 2022-12-29: 2 g via INTRAVENOUS
  Filled 2022-12-29: qty 50

## 2022-12-29 MED ORDER — SODIUM CHLORIDE 0.9% IV SOLUTION: Freq: Once | INTRAVENOUS | Status: DC

## 2022-12-29 MED ORDER — TRAVASOL 10 % IV SOLN
INTRAVENOUS | Status: AC
  Filled 2022-12-29: qty 1458.2

## 2022-12-29 MED ORDER — NOREPINEPHRINE 16 MG/250ML-% IV SOLN
0.0000 ug/min | INTRAVENOUS | Status: DC
  Administered 2022-12-29: 10 ug/min via INTRAVENOUS
  Filled 2022-12-29: qty 250

## 2022-12-29 MED ORDER — VANCOMYCIN HCL 2000 MG/400ML IV SOLN
2000.0000 mg | Freq: Once | INTRAVENOUS | Status: AC
  Administered 2022-12-29: 2000 mg via INTRAVENOUS
  Filled 2022-12-29: qty 400

## 2022-12-29 MED ORDER — IOHEXOL 350 MG/ML SOLN
75.0000 mL | Freq: Once | INTRAVENOUS | Status: AC | PRN
  Administered 2022-12-29: 75 mL via INTRAVENOUS

## 2022-12-29 MED ORDER — ALBUMIN HUMAN 5 % IV SOLN
INTRAVENOUS | Status: AC
  Administered 2022-12-29: 12.5 g via INTRAVENOUS
  Filled 2022-12-29: qty 250

## 2022-12-29 MED ORDER — ORAL CARE MOUTH RINSE: 15.0000 mL | OROMUCOSAL | Status: DC | PRN

## 2022-12-29 MED ORDER — NOREPINEPHRINE 4 MG/250ML-% IV SOLN
0.0000 ug/min | INTRAVENOUS | Status: DC
  Administered 2022-12-29: 15 ug/min via INTRAVENOUS
  Filled 2022-12-29 (×2): qty 250

## 2022-12-29 NOTE — Progress Notes (Signed)
ANTICOAGULATION CONSULT NOTE - Follow Up  Pharmacy Consult for heparin >> argatroban Indication: SMA stenosis/occlusion  Allergies  Allergen Reactions   Zestril [Lisinopril] Cough    Patient Measurements: Height: 5\' 11"  (180.3 cm) Weight: 100.8 kg (222 lb 3.6 oz) IBW/kg (Calculated) : 75.3 Heparin Dosing Weight: 94.6kg  Vital Signs: Temp: 98.4 F (36.9 C) (09/20 1312) Temp Source: Oral (09/20 1312) BP: 95/59 (09/20 1416) Pulse Rate: 81 (09/20 1416)  Labs: Recent Labs    12/28/22 0419 12/28/22 1103 12/28/22 1507 12/29/22 0502 12/29/22 0732 12/29/22 0806 12/29/22 0809 12/29/22 1249  HGB  --  8.9*  --   --  7.8* 9.9*  --  8.9*  HCT  --  27.3*  --   --  24.6* 29.0*  --  27.1*  PLT  --  180  --   --  240  --   --  223  APTT 63*  --   --  89*  --   --   --  47*  LABPROT  --   --   --   --   --   --  20.1*  --   INR  --   --   --   --   --   --  1.7*  --   CREATININE 5.77*  --  5.94* 4.17*  --   --   --   --     Estimated Creatinine Clearance: 20.5 mL/min (A) (by C-G formula based on SCr of 4.17 mg/dL (H)).   Assessment: 15 YOM presenting with abdominal pain found to have mesenteric ischemia. Pharmacy consulted for IV heparin. No AC PTA. Pt s/p ex lap, bowel resection, thrombectomy, and angioplasty 9/12, heparin resumed postop.  Despite multiple rate increases, unable to get heparin to a detectable level, so patient was transitioned to direct thrombin inhibitor argatroban.  Repeat aPTT is 47 seconds, given bleeding issues earlier will not titrate.  Goal of Therapy:  aPTT 50-90 seconds >> aPTT goal ~50 seconds w/ bleeding Monitor platelets by anticoagulation protocol: Yes   Plan:  Continue argatroban at 0.1 mcg/kg/min  Daily aPTT, CBC  Fredonia Highland, PharmD, BCPS, [facility] Clinical Pharmacist [phone] Please check AMION for all [facility] Pharmacy numbers 12/29/2022

## 2022-12-29 NOTE — Progress Notes (Signed)
[facility] NEPHROLOGY PROGRESS NOTE  Assessment/ Plan: Pt is a 67 y.o. 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   # Acute kidney injury on CKD IIIb: Baseline creatinine level around 2.0.  Acute kidney injury likely ischemic ATN due to ex lap surgery/IV contrast/hypotension -remains non-oliguric. Agree with not intervening on right RAS given that his right kidney is already much smaller than anticipated. Likely not much function being contributed from right. -Given poor mental status despite being off sedation and worsening kidney function, started HD on 9/18 after discussing with patient's wife. S/p IHD #2 9/19 -Given worsening clinical picture and the fact that he is going to be receiving contrast for CT angio today (which is absolutely necessary given concern for bowel ischemia, benefits outweigh renal risk), will plan for CRRT post stabilization -Strict ins and out and close lab monitoring.   # Acute mesenteric ischemia: Status post small bowel resection and superior mesenteric artery stent placement.  Followed by vascular surgeon and currently on agatroban since there was inadequate response to heparin. S/p OR again 9/16 for exlap and further small bowel resection, abd closed. Now with concern for bowel ischemia   # Vent dependent respiratory failure: Per PCCM. RRT for clearance   # Anemia likely due to blood loss: CT angio planned   # Shock -pressor support per primary service   Discussed with ICU RN and primary service.  Anthony Sar, MD [facility]  Subjective: Seen and examined at bedside.  Tolerated HD yesterday with net UF 0.5L. However now on pressors with increasing requirements. Blood per rectum and NGT  now. Concern for bowel ischemia Needs emergent CT angio  Objective Vital signs in last 24 hours: Vitals:   12/29/22 0530 12/29/22 0600 12/29/22 0755 12/29/22 0800  BP: 123/66 (!) 95/54 100/61   Pulse: (!) 108 (!) 113    Resp: (!) 31 (!) 34 (!) 30   Temp:    98.2 F (36.8 C)  TempSrc:    Oral  SpO2: 100% 100% 96%   Weight:      Height:       Weight change: -6.4 kg  Intake/Output Summary (Last 24 hours) at 12/29/2022 0910 Last data filed at 12/29/2022 0600 Gross per 24 hour  Intake 2524.26 ml  Output 2977 ml  Net -452.74 ml       Labs: RENAL PANEL Recent Labs  Lab 12/24/22 0500 12/24/22 1937 12/25/22 0345 12/26/22 0429 12/27/22 0455 12/27/22 1242 12/28/22 0419 12/28/22 1507 12/29/22 0502 12/29/22 0806  NA 136 136 137 135 139 138 137 139 136 136   K 3.7 3.6 3.7 4.1 3.5 3.4* 3.0* 3.6 3.6 3.7  CL 102 100 105 102 102 102 101 99 96*  --   CO2 21* 21* 22 21* 21* 23 25 26 24   --   GLUCOSE 126* 127* 123* 153* 157* 162* 162* 172* 247*  --   BUN 57* 66* 72* 86* 110* 116* 104* 117* 84*  --   CREATININE 6.02* 6.87* 6.97* 7.10* 7.05* 7.07* 5.77* 5.94* 4.17*  --   CALCIUM 7.6* 7.8* 7.9* 8.0* 8.3* 8.2* 8.0* 8.7* 8.0*  --   MG 2.0  --  2.2 2.0 2.1  --  2.0  --   --   --   PHOS 5.8*  --  6.0* 6.0* 4.4 4.3 2.9  --  3.3  --   ALBUMIN  --  1.8* 1.8*  --   --  1.7* 1.6*  --  1.8*  --     Liver Function Tests: Recent Labs  Lab 12/24/22 1937 12/25/22 0345 12/27/22 1242 12/28/22 0419 12/29/22 0502  AST 72* 79*  --  46*  --   ALT 29 32  --  22  --   ALKPHOS 38 45  --  75  --   BILITOT 0.6 0.7  --  1.0  --   PROT 4.6* 5.0*  --  5.1*  --   ALBUMIN 1.8* 1.8* 1.7* 1.6* 1.8*   No results for input(s): "LIPASE", "AMYLASE" in the last 168 hours. Recent Labs  Lab 12/27/22 1027  AMMONIA 12   CBC: Recent Labs    12/27/22 0455 12/27/22 1242 12/28/22 1103 12/29/22 0732 12/29/22 0806  HGB 9.8* 9.1* 8.9* 7.8* 9.9*  MCV 80.4 79.3* 78.9* 80.9  --     Cardiac Enzymes: No  results for input(s): "CKTOTAL", "CKMB", "CKMBINDEX", "TROPONINI" in the last 168 hours. CBG: Recent Labs  Lab 12/28/22 0811 12/28/22 1134 12/28/22 1548 12/29/22 0021 12/29/22 0330  GLUCAP 155* 164* 169* 162* 168*    Iron Studies: No results for input(s): "IRON", "TIBC", "TRANSFERRIN", "FERRITIN" in the last 72 hours. Studies/Results: DG Abd Portable 1V  Result Date: 12/28/2022 CLINICAL DATA:  NG Tube EXAM: PORTABLE ABDOMEN - 1 VIEW COMPARISON:  Abdominal radiograph 12/21/22 FINDINGS: Enteric tube courses below diaphragm with side hole and tip projecting over the expected location of the stomach. Paucity of bowel gas limits the ability to assess for obstruction, but within this limitation. No radio-opaque calculi or other significant radiographic abnormality are seen. Lung bases are clear IMPRESSION: Enteric tube courses below diaphragm with side hole and tip projecting over the expected location of the stomach. Electronically Signed   By: Lorenza Cambridge M.D.   On: 12/28/2022 12:24    Medications: Infusions:  anticoagulant sodium citrate     argatroban 0.1 mcg/kg/min (12/29/22 0802)   fentaNYL infusion INTRAVENOUS 100 mcg/hr (12/29/22 0829)   norepinephrine (LEVOPHED) Adult infusion 10 mcg/min (12/29/22 0816)   TPN ADULT (ION) 98 mL/hr at 12/29/22 0600   vasopressin 0.04 Units/min (12/29/22 0821)    Scheduled Medications:  sodium chloride   Intravenous Once   aspirin  300 mg Rectal Daily   Chlorhexidine Gluconate Cloth  6 each Topical Daily   insulin aspart  0-15 Units Subcutaneous Q4H   mouth rinse  15 mL Mouth Rinse Q2H   pantoprazole (PROTONIX) IV  40 mg Intravenous Q12H   sodium chloride flush  3 mL Intravenous Q12H    have reviewed scheduled and prn medications.  Physical Exam: General: ill appearing, Intubated Heart:RRR, s1s2 nl Lungs: cta bilateral. intubated Abdomen: dressings in place, distended Extremities:No sig edema Neurology: unresponsive Dialysis access: RIJ  temp HD catheter  Williamson Cavanah 12/29/2022,9:10 AM  LOS: 9 days

## 2022-12-29 NOTE — Progress Notes (Signed)
[facility] Progress Note  4 Days Post-Op  Subjective: CC:  Hypotension and pressor requirement overnight, now on high-dose levo Having blood per NGT and per rectum   Afebrile, WNC 28 from 12 Hgb 7.8 from 8.9 Lactate pending  Objective: Vital signs in last 24 hours: Temp:  [97.7 F (36.5 C)-99.6 F (37.6 C)] 98.6 F (37 C) (09/20 0400) Pulse Rate:  [83-118] 113 (09/20 0600) Resp:  [18-36] 34 (09/20 0600) BP: (64-161)/(26-106) 95/54 (09/20 0600) SpO2:  [94 %-100 %] 100 % (09/20 0600) FiO2 (%):  [40 %] 40 % (09/20 0400) Weight:  [100.8 kg] 100.8 kg (09/20 0500) Last BM Date : 12/17/22  Intake/Output from previous day: 09/19 0701 - 09/20 0700 In: 2735.9 [I.V.:2472.5; IV Piggyback:263.4] Out: 2977 [Urine:1775; Emesis/NG output:700; Stool:2] Intake/Output this shift: No intake/output data recorded.  PE: Gen:  intubated Card:  tachycardic Pulm:  ventilated respirations, tachypnea  Abd: distended but not rigid. Midline incision w/ dressing in place. Coffee ground effluent in NG cannister.  GU: Clear yellow urine in foley bag Skin: warm and dry Psych:unable to assess  Lab Results:  Recent Labs    12/28/22 1103 12/29/22 0732 12/29/22 0806  WBC 12.5* 28.1*  --   HGB 8.9* 7.8* 9.9*  HCT 27.3* 24.6* 29.0*  PLT 180 240  --    BMET Recent Labs    12/28/22 1507 12/29/22 0502 12/29/22 0806  NA 139 136 136  K 3.6 3.6 3.7  CL 99 96*  --   CO2 26 24  --   GLUCOSE 172* 247*  --   BUN 117* 84*  --   CREATININE 5.94* 4.17*  --   CALCIUM 8.7* 8.0*  --    PT/INR No results for input(s): "LABPROT", "INR" in the last 72 hours. CMP     Component Value Date/Time   NA 136 12/29/2022 0806   K 3.7 12/29/2022 0806   CL 96 (L) 12/29/2022 0502   CO2 24 12/29/2022 0502   GLUCOSE 247 (H) 12/29/2022 0502   BUN 84 (H) 12/29/2022 0502   CREATININE 4.17 (H) 12/29/2022 0502   CALCIUM 8.0 (L) 12/29/2022 0502   PROT 5.1 (L) 12/28/2022 0419   PROT 6.3 03/16/2020  0800   ALBUMIN 1.8 (L) 12/29/2022 0502   ALBUMIN 4.3 03/16/2020 0800   AST 46 (H) 12/28/2022 0419   ALT 22 12/28/2022 0419   ALKPHOS 75 12/28/2022 0419   BILITOT 1.0 12/28/2022 0419   BILITOT 0.8 03/16/2020 0800   GFRNONAA 15 (L) 12/29/2022 0502   GFRAA >60 04/14/2015 1127   Lipase     Component Value Date/Time   LIPASE 28 11/27/2022 0755       Studies/Results: DG Abd Portable 1V  Result Date: 12/28/2022 CLINICAL DATA:  NG Tube EXAM: PORTABLE ABDOMEN - 1 VIEW COMPARISON:  Abdominal radiograph 12/21/22 FINDINGS: Enteric tube courses below diaphragm with side hole and tip projecting over the expected location of the stomach. Paucity of bowel gas limits the ability to assess for obstruction, but within this limitation. No radio-opaque calculi or other significant radiographic abnormality are seen. Lung bases are clear IMPRESSION: Enteric tube courses below diaphragm with side hole and tip projecting over the expected location of the stomach. Electronically Signed   By: Lorenza Cambridge M.D.   On: 12/28/2022 12:24    Anti-infectives: Anti-infectives (From admission, onward)    Start     Dose/Rate Route Frequency Ordered Stop   12/23/22 1230  piperacillin-tazobactam (ZOSYN) IVPB 2.25 g  2.25 g 100 mL/hr over 30 Minutes Intravenous Every 8 hours 12/23/22 1139 12/28/22 2109   12/22/22 0000  piperacillin-tazobactam (ZOSYN) IVPB 3.375 g  Status:  Discontinued        3.375 g 12.5 mL/hr over 240 Minutes Intravenous Every 8 hours 12/21/22 2331 12/23/22 1139        Assessment/Plan 67 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 Karin Lieu, exploratory laparotomy and small bowel resection Dr. Janee Morn S/p re-exploration, replace abthera FB 12/23/22   S/p re-exploration, small bowel resection with anastomosis x2, assessment of bowel perfusion with ICG, abdominal closure 9/16 Dr. Andrey Campanile - POD#4 - afebrile, tachycardic, increasing  pressor requirement, spike in WBC to 28 - blood in NGT and per rectum.  -  clinical picture is very concerning for bowel ischemia. CT scans pending. Lactate pending. Argatroban being decreased. Vascular following closely as well.  - continue Zosyn - TPN per pharmacy - [facility] will follow closely    LOS: 9 days   I reviewed nursing notes, Consultant nephrology, vascular notes, hospitalist notes, last 24 h vitals and pain scores, last 48 h intake and output, last 24 h labs and trends, and last 24 h imaging results.    Hosie Spangle, PA-C [facility] Please see Amion for pager number during day hours 7:00am-4:30pm

## 2022-12-29 NOTE — Progress Notes (Addendum)
  Progress Note    12/29/2022 7:52 AM 4 Days Post-Op  Subjective:  intubated    Vitals:   12/29/22 0530 12/29/22 0600  BP: 123/66 (!) 95/54  Pulse: (!) 108 (!) 113  Resp: (!) 31 (!) 34  Temp:    SpO2: 100% 100%    Physical Exam: General:  intubated, on vent Cardiac:  regular Lungs:  ventilated Incisions:  abdominal incision dressed and dry Extremities:  faint right PT and left DP doppler signals, feet are cool to touch Abdomen:  soft, ND  CBC    Component Value Date/Time   WBC 12.5 (H) 12/28/2022 1103   RBC 3.46 (L) 12/28/2022 1103   HGB 8.9 (L) 12/28/2022 1103   HCT 27.3 (L) 12/28/2022 1103   PLT 180 12/28/2022 1103   MCV 78.9 (L) 12/28/2022 1103   MCH 25.7 (L) 12/28/2022 1103   MCHC 32.6 12/28/2022 1103   RDW 15.0 12/28/2022 1103   LYMPHSABS 0.9 10/30/2022 1654   MONOABS 0.6 10/30/2022 1654   EOSABS 0.3 10/30/2022 1654   BASOSABS 0.0 10/30/2022 1654    BMET    Component Value Date/Time   NA 136 12/29/2022 0502   K 3.6 12/29/2022 0502   CL 96 (L) 12/29/2022 0502   CO2 24 12/29/2022 0502   GLUCOSE 247 (H) 12/29/2022 0502   BUN 84 (H) 12/29/2022 0502   CREATININE 4.17 (H) 12/29/2022 0502   CALCIUM 8.0 (L) 12/29/2022 0502   GFRNONAA 15 (L) 12/29/2022 0502   GFRAA >60 04/14/2015 1127    INR No results found for: "INR"   Intake/Output Summary (Last 24 hours) at 12/29/2022 0752 Last data filed at 12/29/2022 0600 Gross per 24 hour  Intake 2735.9 ml  Output 2977 ml  Net -241.1 ml      Assessment/Plan:  67 y.o. male is s/p:ex lap with SMA stenting and small bowel resection    4 days post op: repeat ex lap with further small bowel resection   -Critically ill and remains intubated. No plans for extubation until mental status and kidney function improves -Received IHD the past two days totaling 1500cc fluid off. Patient started requiring Levo yesterday for hypotension. -Coffee ground output in NGT, concerning for possible GI bleed -CBC this  morning pending. May plan for abdominal scan if CBC is concerning -Appreciate pharmacy assistance in lowering argatroban dosing   Loel Dubonnet, PA-C [facility] [phone] 12/29/2022 7:52 AM  VASCULAR STAFF ADDENDUM: I have independently interviewed and examined the patient. I agree with the above.  Acute change overnight. Bloody output NG tube, bloody bowel movement. Patient with pressor need. Concern for intra-abdominal pathology.  Hopeful this is a gastric ulcer, but worried this could involve small bowel.  2 new anastomoses. Labs pending, give blood as needed.  Wean pressors as tolerated as pressor requirement will hurt anastomotic healing. Both myself and Dr. Katrinka Blazing discussed the risks and benefits of CT with contrast.  Unfortunately, while I realize this will likely result in permanent renal failure, I am worried that the current problem could result in mortality without this.   Will follow-up labs.   Victorino Sparrow MD [facility] Phone Number: [phone] 12/29/2022 8:53 AM

## 2022-12-29 NOTE — Progress Notes (Signed)
12/29/2022 Emergent consent obtained for potentially life-saving scan with IV contrast.  Myrla Halsted MD

## 2022-12-29 NOTE — Progress Notes (Addendum)
ANTICOAGULATION CONSULT NOTE - Follow Up  Pharmacy Consult for heparin >> argatroban Indication: SMA stenosis/occlusion  Allergies  Allergen Reactions   Zestril [Lisinopril] Cough    Patient Measurements: Height: 5\' 11"  (180.3 cm) Weight: 100.8 kg (222 lb 3.6 oz) IBW/kg (Calculated) : 75.3 Heparin Dosing Weight: 94.6kg  Vital Signs: Temp: 98.6 F (37 C) (09/20 0400) Temp Source: Oral (09/20 0400) BP: 95/54 (09/20 0600) Pulse Rate: 113 (09/20 0600)  Labs: Recent Labs    12/27/22 0455 12/27/22 0957 12/27/22 1242 12/28/22 0419 12/28/22 1103 12/28/22 1507 12/29/22 0502  HGB 9.8*  --  9.1*  --  8.9*  --   --   HCT 30.8*  --  28.7*  --  27.3*  --   --   PLT 169  --  183  --  180  --   --   APTT 70* 67*  --  63*  --   --  89*  CREATININE 7.05*  --  7.07* 5.77*  --  5.94* 4.17*    Estimated Creatinine Clearance: 20.5 mL/min (A) (by C-G formula based on SCr of 4.17 mg/dL (H)).   Assessment: 43 YOM presenting with abdominal pain found to have mesenteric ischemia. Pharmacy consulted for IV heparin. No AC PTA. Pt s/p ex lap, bowel resection, thrombectomy, and angioplasty 9/12, heparin resumed postop.  Despite multiple rate increases, unable to get heparin to a detectable level, so patient was transitioned to direct thrombin inhibitor argatroban.  aPTT elevated at 89 seconds and pt with dark OG output. CT and CBC pending. [facility] wants to continue argatroban for now - will reduce rate and target aPTT ~50 seconds.  Goal of Therapy:  aPTT 50-90 seconds >> aPTT goal ~50 seconds w/ bleeding Monitor platelets by anticoagulation protocol: Yes   Plan:  Reduce argatroban to 0.1 mcg/kg/min  Repeat aPTT in 4h  Fredonia Highland, PharmD, [place], [facility] Clinical Pharmacist [phone] Please check AMION for all [facility] Pharmacy numbers 12/29/2022

## 2022-12-29 NOTE — Progress Notes (Signed)
eLink Physician-Brief Progress Note Patient Name: [name] [name] DOB: Dec 15, 1955 MRN: [id number]   Date of Service  12/29/2022  HPI/Events of Note  67 year old male admitted with SMA occlusion and bowel ischemia.  Currently in ICU intubated and in acute renal failure undergoing hemodialysis.  Blood pressure has been low and currently is 75/57.  eICU Interventions  Patient's chart reviewed.  Labs and pertinent imaging reviewed.  Case discussed with bedside nurse.  Will start patient on norepinephrine to help him tolerate hemodialysis.  Order placed.     Intervention Category Intermediate Interventions: Hypotension - evaluation and management  Carilyn Goodpasture 12/29/2022, 3:16 AM

## 2022-12-29 NOTE — Progress Notes (Signed)
ICU  Intubated  Informed consent signed and in chart.   TX duration: 2.5 hourd  Patient tolerated well.  Transported back to the room  Alert, without acute distress.  Hand-off given to patient's nurse.   Access used: catheter Access issues: none  Total UF removed: 500 ml Medication(s) given: none Post HD VS: 108/61 Post HD weight: unable to obtain     12/29/22 0448  Vitals  BP 108/61  MAP (mmHg) (!) 33  BP Location Left Arm  BP Method Automatic  Patient Position (if appropriate) Lying  Pulse Rate Source Monitor  During Treatment Monitoring  Blood Flow Rate (mL/min) 0 mL/min  Arterial Pressure (mmHg) 0 mmHg  Venous Pressure (mmHg) -0.2 mmHg  TMP (mmHg) -57.57 mmHg  Ultrafiltration Rate (mL/min) 784 mL/min  Dialysate Flow Rate (mL/min) 299 ml/min  Dialysate Potassium Concentration 3  Dialysate Calcium Concentration 3  Duration of HD Treatment -hour(s) 2.5 hour(s)  Cumulative Fluid Removed (mL) per Treatment  500.15  HD Safety Checks Performed Yes  Intra-Hemodialysis Comments Tx completed  Post Treatment  Dialyzer Clearance Lightly streaked  Hemodialysis Intake (mL) 0 mL  Liters Processed 34  Fluid Removed (mL) 500 mL  Tolerated HD Treatment No (Comment)  AVG/AVF Arterial Site Held (minutes) 0 minutes  AVG/AVF Venous Site Held (minutes) 0 minutes  Note  Patient Observations 0  Hemodialysis Catheter Right Internal jugular Triple lumen Temporary (Non-Tunneled)  Placement Date: 12/23/22   Placed prior to admission: Yes  Time Out: Correct patient;Correct site;Correct procedure  Maximum sterile barrier precautions: Hand hygiene;Cap;Mask;Sterile gown;Sterile gloves;Large sterile sheet;Sterile probe cover  Site P...  Site Condition No complications  Blue Lumen Status Infusing  Red Lumen Status Infusing  Catheter fill solution Heparin 1000 units/ml  Catheter fill volume (Arterial) 1.2 cc  Catheter fill volume (Venous) 1.2  Dressing Status Antimicrobial disc in place   Drainage Description None  Post treatment catheter status Capped and Clamped

## 2022-12-29 NOTE — Progress Notes (Signed)
Nutrition Follow-up  DOCUMENTATION CODES:   Severe malnutrition in context of acute illness/injury  INTERVENTION:   TPN to meet 100% nutritional needs, TPN order per TPN Pharmacist   NUTRITION DIAGNOSIS:   Severe Malnutrition related to acute illness as evidenced by energy intake < or equal to 50% for > or equal to 5 days, percent weight loss.  Being addressed  GOAL:   Patient will meet greater than or equal to 90% of their needs  Met via TPN  MONITOR:   Vent status, Labs, Weight trends  REASON FOR ASSESSMENT:   Ventilator, Consult New TPN/TNA  ASSESSMENT:   67 yo male admitted with SMA stenosis/thrombosis and SB ischemia requiring SMA thrombectomy and stent followed by ex lap and SB resection. Pt with poor po intake and weight loss for several weeks PTA. 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, CKD 3, GERD, hx testicular cancer, HLD, HTN  9/11 Admitted 9/12 OR: acute on chronic mesenteric ischemia with necrotic area of jejunum requiring SB resection, bowel left in discontinuity, abdomen open with ABTherea wound VAC, SMA thrombectomy and stent 9/14 OR: reopening of recent laparotomy, evaluation of SMA with Doppler including mesentery of small bowel, placement of abthera device  9/16 OR: sig areas of ischemia with 2 areas of perforation, another 66 cm section of jejunum was resected and area of discontinuity was reanastomosed 9/18 iHD 9/19 2nd iHD  Pt remains sedated on vent support Escalating pressor requirements (levophed at 9 after addition of vasopressin at 0.04 this AM), +blood via NG and +blood per rectum; CT angio for GI bleed today. Lactic acid wdl, trending. WBC 26.1, slightly down from AM but more than double from yesterday Noted plan for CRRT post stabilization.  UOP 1.8 L in 24 hours but has decreased significantly today with only 165 mL thus far  TPN at 98 ml/hr providing 2304 kcals, 146 g of protein  and 2352 mL of fluid  Current wt 100.8 kg Admit wt 96 kg Highest wt: 107.2 kg  Labs: CBGs 155-187 (ICU goal 140-180), sodium 136 (wdl), potassium 3.7 (wdl), phosphorus 3.3 (wdl), magnesium wdl Meds: ss novolog  Diet Order:   Diet Order             Diet NPO time specified  Diet effective midnight                   EDUCATION NEEDS:   Not appropriate for education at this time  Skin:  Skin Assessment: Skin Integrity Issues: Skin Integrity Issues:: DTI DTI: bilateral buttocks Wound Vac: n/a  Last BM:  9/20 +bloody stool  Height:   Ht Readings from Last 1 Encounters:  12/20/22 5\' 11"  (1.803 m)    Weight:   Wt Readings from Last 1 Encounters:  12/29/22 100.8 kg     BMI:  Body mass index is 30.99 kg/m.  Estimated Nutritional Needs:   Kcal:  2300-2500 kcals  Protein:  140 -150 g  Fluid:  >/= 2.3 L   Romelle Starcher MS, RDN, LDN, CNSC Registered Dietitian 3 Clinical Nutrition RD Pager and On-Call Pager Number Located in [place]

## 2022-12-29 NOTE — Progress Notes (Signed)
eLink Physician-Brief Progress Note Patient Name: ALEKSEY BLACKBURN DOB: 23-Dec-1955 MRN: [id number]   Date of Service  12/29/2022  HPI/Events of Note  PT converted back to sinus rhythm/sinus bradycardia with HR as low as 40s.   eICU Interventions  Amiodarone drip has been turned off, HR 52.  Pt remains on levophed and vasopressin but has maintained MAP >65 on stable doses.  Will continue to monitor.         Alban Marucci M DELA CRUZ 12/29/2022, 7:58 PM  12:43 AM Notified of Hgb 7.5, with prior post transfusion Hgb of 9.3 With coffeeground material draining from Gtube, although RN reports that this is less that what it has put out previously.  Placed order to transfuse 1 unit pRBC

## 2022-12-29 NOTE — Progress Notes (Signed)
1945-  SB 48-52.  Amiodarone gtt held at this time.  Elink notified. 0030-  Timed CBC resulted.  H/H drop noted.  Elink updated.  PRBC x1 to be infused.

## 2022-12-29 NOTE — Progress Notes (Signed)
Pharmacy Antibiotic Note  [name] [name] is a 67 y.o. male admitted on 12/20/2022 with open abdomen in discontinuity since 9/12. Patient underwent SMA stent and found to have necrotic area of the jejunum now s/p resection with reanastomosis of the small bowel and abdominal closure 9/16.  Completed a 7-day course of Zosyn 9/19 and now concerns for shock.  Pharmacy has been consulted for vancomycin and meropenem dosing.  WBC 12.5 > 31.7, Tmax/24h 100.21F CRRT started this morning (9/20)  Plan: Vancomycin 2000 mg IV x1, then 1000 mg IV Q24h  Merrem 1gm IV Q8h Follow up LOT, signs of clinical improvement and levels as indicated  Height: 5\' 11"  (180.3 cm) Weight: 100.8 kg (222 lb 3.6 oz) IBW/kg (Calculated) : 75.3  Temp (24hrs), Avg:98.5 F (36.9 C), Min:98.2 F (36.8 C), Max:99.6 F (37.6 C)  Recent Labs  Lab 12/27/22 0455 12/27/22 1242 12/28/22 0419 12/28/22 1103 12/28/22 1507 12/29/22 0502 12/29/22 0732 12/29/22 0756 12/29/22 1034 12/29/22 1249 12/29/22 1255 12/29/22 1558  WBC 15.5* 13.5*  --  12.5*  --   --  28.1*  --   --  26.1*  --  31.7*  CREATININE 7.05* 7.07* 5.77*  --  5.94* 4.17*  --   --   --   --   --   --   LATICACIDVEN  --   --   --   --   --   --   --  1.5 1.4  --  1.6  --     Estimated Creatinine Clearance: 20.5 mL/min (A) (by C-G formula based on SCr of 4.17 mg/dL (H)).    Allergies  Allergen Reactions   Zestril [Lisinopril] Cough    Antimicrobials this admission: Zosyn 9/13 >> 9/19 Vancomycin 9/20 >> c Meropenem 9/20 >> c  Thank you for allowing pharmacy to participate in this patient's care,  Trixie Rude, PharmD Clinical Pharmacist 12/29/2022  5:17 PM

## 2022-12-29 NOTE — Progress Notes (Signed)
PHARMACY - TOTAL PARENTERAL NUTRITION CONSULT NOTE  Indication:  SMA syndrome w associated weight loss  Patient Measurements: Height: 5\' 11"  (180.3 cm) Weight: 100.8 kg (222 lb 3.6 oz) IBW/kg (Calculated) : 75.3 TPN AdjBW (KG): 80.4 Body mass index is 30.99 kg/m. Usual Weight: 95 kg, has lost 50lbs recently   Assessment:  67 yo M admitted with abdominal pain, weight loss, and poor PO intake. 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 He is now s/p SMA stent placement. He was initially left is discontinuity with open abdomen and was closed on 9/16. TPN was initiated 9/14.   *New: blood in NGT and per rectum, wbc and glucose increasing, clinical concern for ischemia.   Glucose / Insulin: A1c 5.3 - CBGs 160-170's (but one outlier of 247 off blood sample; wbc trending up- will increase SSI and monitor) Used 11 units SSI in the past 24 hrs. Electrolytes: K 3.7, Phos 3.3 (no Phos in TPN), iCa 1.06/CoCa 9.8. Ca x Phos improved for goal < 55. CO2 wnl. Cl trend down. Will adjust.  Renal: iHD x2 days - SCr 4, BUN 84, still making urine Hepatic: AST trending down, other LFTs/tbili / TG WNL, albumin 1.8  Intake / Output; MIVF: UOP 0.7 ml/kg/hr (last Lasix 9/14), NG down 195 mL, wound vac output not reported for last 24 hrs. Net I/O improved. LBM 9/8.  GI Imaging:  9/20 9/20 CT Angio >> GI Surgeries / Procedures:  9/14 re-ex lap, left in discontinuity with plans for re-exploration  9/16 re-ex lap, small bowel resection with anastomosis x2, abdominal closure Wounds (aside from abdomen): Deep tissue pressure injury to bilateral buttocks - wound care on board  Central access: CVC [facility] 12/23/22 TPN start date: 12/23/22  Nutritional Goals: Goal TPN rate is 98 mL/hr (provides 146g AA and 2304 kCal per day) - adjusted 9/18 for increased estimated needs   RD Estimated Needs Total Energy Estimated Needs: 2300-2500 kcals Total Protein  Estimated Needs: 140 -150 g Total Fluid Estimated Needs: >/= 2.3 L  Current Nutrition:  TPN  Stopping TF with concern for bowel ischemia- strict NPO   Plan:  Unable to concentrate TPN due to AA shortage.  Continue TPN to new goal rate of 98 ml/hr starting at 1800.  Electrolytes in TPN: Na 52mEq/L, K 12 mEq/L (~28 mEq/day), Ca 5 mEq/L, Mg 5 mEq/L, Phos 0 mmol/L, adjust from max acetate to 1:1 Cl: Acetate. Add standard MVI and trace elements to TPN. No chromium due to HD and on shortage.  Increase moderate SSI Q4H for now Monitor TPN labs on Mon/Thurs - labs in AM Follow-up further HD plans.  Monitor bowel function and ability to advance tube feeds.    Link Snuffer, PharmD, BCPS, BCCCP Please refer to [facility] for [facility] numbers 12/29/2022, 7:29 AM

## 2022-12-29 NOTE — Progress Notes (Signed)
NAME:  [name] [name], MRN:  [id number], DOB:  [birth year], LOS: 9 ADMISSION DATE:  12/20/2022, CONSULTATION DATE: 9/12 REFERRING MD: Dr. Jacqulyn Bath, CHIEF COMPLAINT: Mesenteric ischemia  History of Present Illness:   67 year old male with past medical history as below, which is significant for intestinal stromal tumor removal in 2022, known mesenteric stenosis, GERD, hypertension, and hyperlipidemia.  In the few weeks prior to presentation on 9/11 he had been evaluated at several campuses for abdominal pain with noncontrasted CTs of the abdomen which were unrevealing.  He continued to have abdominal pain, weight loss, and poor p.o. intake.  He presented [name] on 9/11 and was admitted to the hospitalist for abdominal pain with concern for superior mesenteric artery thrombus.  He was started on heparin infusion.  Vascular surgery was consulted and ultimately a CT angiogram of the abdomen and pelvis was done 9/12 demonstrating acute occlusion of the distal SMA as well as occlusion of the inferior mesenteric artery.  He was taken to the operating room emergently and and was found to have an necrotic area of the jejunum.  This was resected, and SMA stent was placed, and the patient was left with an open abdomen.  He also remained on the mechanical ventilator postoperatively and PCCM was asked to admit for ventilator management.  Pertinent  Medical History   has a past medical history of Ankle fracture, Cancer (HCC) (1974), Cataract, GERD (gastroesophageal reflux disease), colonic polyps, Hyperlipidemia, Hypertension, Kidney stones, Patella fracture, Retinal detachment, Retinal tear of right eye, and Syncopal episodes (2008).   Significant Hospital Events: Including procedures, antibiotic start and stop dates in addition to other pertinent events   9/11 admitted with abdominal pain 9/12 CT demonstrating mesenteric occlusion, bowel resection and stent placed, to ICU on vent. Left in discontinuity  9/14  back to OR. Left in discontinuity as still concern about bowel integrity 9/16 back to OR  s sig areas of ischemia w/ 2 areas of perforation, another 66cm section of jejunum was resected and area of discontinuity was reanastomosed. Changed to argatroban post-op as unable to get heparin levels detectable  9/17 dropping RASS goal to 0 to -1, assessing for readiness to wean, serum creatinine and BUN still climbing some but making urine 9/18 iHD   Interim History / Subjective:  Blood from above and below Escalating pressor needs  Objective   Blood pressure (!) 95/54, pulse (!) 113, temperature 98.6 F (37 C), temperature source Oral, resp. rate (!) 34, height 5\' 11"  (1.803 m), weight 100.8 kg, SpO2 100%. CVP:  [1 mmHg-10 mmHg] 8 mmHg  Vent Mode: PRVC FiO2 (%):  [40 %] 40 % Set Rate:  [15 bmp] 15 bmp Vt Set:  [600 mL] 600 mL PEEP:  [5 cmH20] 5 cmH20 Pressure Support:  [10 cmH20] 10 cmH20 Plateau Pressure:  [15 cmH20] 15 cmH20   Intake/Output Summary (Last 24 hours) at 12/29/2022 0802 Last data filed at 12/29/2022 0600 Gross per 24 hour  Intake 2623.41 ml  Output 2977 ml  Net -353.59 ml   Filed Weights   12/27/22 1833 12/28/22 0530 12/29/22 0500  Weight: 106.2 kg 100 kg 100.8 kg    Examination: Ill appearing, tachypneic on vent Pupils small, not following commands Abd soft, incision site dressed without strikethrough Dark blood from OGT Lungs diminished, tachypneic  BMP improved with HD CXR looks fine CBC pending  Resolved Hospital Problem list     Assessment & Plan:   Acute mesenteric ischemia: Status post small bowel resection  and superior mesenteric artery stent placement 9/12.  Open abdomen, left in discontinuity since 9/12, another 66 cm of jejunum was resected on 9/16 prior to successful reanastomosis of the small bowel Plan: Postoperative ventilator management-  Acute encephalopathy: likely uremia and sedation related AKI on CKD3: in context of mesenteric ischemia,  fluid shifts and likely ATN.   Hypokalemia Hyperglycemia Postop anemia Protein calorie malnutrition : significant weight loss prior to this admission, now NPO Sacral decub/deep tissue wound. Still not at place where can be staged-WOC following Hx HTN, cataracts, GERD, HLD New: worsening shock state- with possible GIB vs. Recurrent ischemia; stat CBC/lactate, GIB scan, CT head/chest while there.  Usual transfusion thresholds, convert to CRRT.  - See above, management pending scans - Levo, add vaso - Has completed 7 days of zosyn - Argatroban lower goal for now pending results of CT and CBC - Vent bundle - CRRT pending stabilization - Guarded prognosis: CCS and VVS following closely  Best Practice (right click and "Reselect all SmartList Selections" daily)   Diet/type: TPN DVT prophylaxis: argatroban GI prophylaxis: PPI Lines: Central line and Dialysis Catheter Foley:  Yes, and it is still needed Code Status:  full code Last date of multidisciplinary goals of care discussion [updated wife at bedside 9/18]  51 mi cc time Myrla Halsted MD [facility] 12/29/2022, 8:02 AM  Please see [email or web address] for pager details.  From 7A-7P if no response, please call [phone]. After hours, please call ELink [phone].

## 2022-12-30 DIAGNOSIS — K551 Chronic vascular disorders of intestine: Secondary | ICD-10-CM | POA: Diagnosis not present

## 2022-12-30 LAB — RENAL FUNCTION PANEL
Albumin: 1.8 g/dL — ABNORMAL LOW (ref 3.5–5.0)
Albumin: 1.8 g/dL — ABNORMAL LOW (ref 3.5–5.0)
Anion gap: 15 (ref 5–15)
Anion gap: 12 (ref 5–15)
BUN: 78 mg/dL — ABNORMAL HIGH (ref 8–23)
BUN: 76 mg/dL — ABNORMAL HIGH (ref 8–23)
CO2: 23 mmol/L (ref 22–32)
CO2: 24 mmol/L (ref 22–32)
Calcium: 7.9 mg/dL — ABNORMAL LOW (ref 8.9–10.3)
Calcium: 8 mg/dL — ABNORMAL LOW (ref 8.9–10.3)
Chloride: 96 mmol/L — ABNORMAL LOW (ref 98–111)
Chloride: 101 mmol/L (ref 98–111)
Creatinine, Ser: 2.91 mg/dL — ABNORMAL HIGH (ref 0.61–1.24)
Creatinine, Ser: 2.62 mg/dL — ABNORMAL HIGH (ref 0.61–1.24)
GFR, Estimated: 23 mL/min — ABNORMAL LOW (ref >60)
GFR, Estimated: 26 mL/min — ABNORMAL LOW (ref >60)
Glucose, Bld: 227 mg/dL — ABNORMAL HIGH (ref 70–99)
Glucose, Bld: 206 mg/dL — ABNORMAL HIGH (ref 70–99)
Phosphorus: 2.8 mg/dL (ref 2.5–4.6)
Phosphorus: 2.6 mg/dL (ref 2.5–4.6)
Potassium: 4.5 mmol/L (ref 3.5–5.1)
Potassium: 4.4 mmol/L (ref 3.5–5.1)
Sodium: 134 mmol/L — ABNORMAL LOW (ref 135–145)
Sodium: 137 mmol/L (ref 135–145)

## 2022-12-30 LAB — CBC
HCT: 23.2 % — ABNORMAL LOW (ref 39.0–52.0)
HCT: 26.8 % — ABNORMAL LOW (ref 39.0–52.0)
HCT: 27.5 % — ABNORMAL LOW (ref 39.0–52.0)
Hemoglobin: 7.5 g/dL — ABNORMAL LOW (ref 13.0–17.0)
Hemoglobin: 8.9 g/dL — ABNORMAL LOW (ref 13.0–17.0)
Hemoglobin: 9 g/dL — ABNORMAL LOW (ref 13.0–17.0)
MCH: 26.6 pg (ref 26.0–34.0)
MCH: 27.1 pg (ref 26.0–34.0)
MCH: 27.7 pg (ref 26.0–34.0)
MCHC: 32.3 g/dL (ref 30.0–36.0)
MCHC: 32.4 g/dL (ref 30.0–36.0)
MCHC: 33.6 g/dL (ref 30.0–36.0)
MCV: 82.3 fL (ref 80.0–100.0)
MCV: 82.5 fL (ref 80.0–100.0)
MCV: 83.8 fL (ref 80.0–100.0)
Platelets: 185 10*3/uL (ref 150–400)
Platelets: 185 10*3/uL (ref 150–400)
Platelets: 188 10*3/uL (ref 150–400)
RBC: 2.82 MIL/uL — ABNORMAL LOW (ref 4.22–5.81)
RBC: 3.25 MIL/uL — ABNORMAL LOW (ref 4.22–5.81)
RBC: 3.28 MIL/uL — ABNORMAL LOW (ref 4.22–5.81)
RDW: 15.2 % (ref 11.5–15.5)
RDW: 15.6 % — ABNORMAL HIGH (ref 11.5–15.5)
RDW: 15.7 % — ABNORMAL HIGH (ref 11.5–15.5)
WBC: 29.8 10*3/uL — ABNORMAL HIGH (ref 4.0–10.5)
WBC: 30.1 10*3/uL — ABNORMAL HIGH (ref 4.0–10.5)
WBC: 33 10*3/uL — ABNORMAL HIGH (ref 4.0–10.5)
nRBC: 0 % (ref 0.0–0.2)
nRBC: 0 % (ref 0.0–0.2)
nRBC: 0 % (ref 0.0–0.2)

## 2022-12-30 LAB — APTT: aPTT: 42 seconds — ABNORMAL HIGH (ref 24–36)

## 2022-12-30 LAB — GLUCOSE, CAPILLARY
Glucose-Capillary: 223 mg/dL — ABNORMAL HIGH (ref 70–99)
Glucose-Capillary: 218 mg/dL — ABNORMAL HIGH (ref 70–99)
Glucose-Capillary: 197 mg/dL — ABNORMAL HIGH (ref 70–99)
Glucose-Capillary: 179 mg/dL — ABNORMAL HIGH (ref 70–99)
Glucose-Capillary: 172 mg/dL — ABNORMAL HIGH (ref 70–99)
Glucose-Capillary: 200 mg/dL — ABNORMAL HIGH (ref 70–99)
Glucose-Capillary: 176 mg/dL — ABNORMAL HIGH (ref 70–99)

## 2022-12-30 LAB — PREPARE RBC (CROSSMATCH)

## 2022-12-30 LAB — PROCALCITONIN: Procalcitonin: 3.82 ng/mL

## 2022-12-30 LAB — MAGNESIUM: Magnesium: 2.4 mg/dL (ref 1.7–2.4)

## 2022-12-30 MED ORDER — SODIUM CHLORIDE 0.9% IV SOLUTION: Freq: Once | INTRAVENOUS | Status: AC

## 2022-12-30 MED ORDER — TRAVASOL 10 % IV SOLN
INTRAVENOUS | Status: AC
  Filled 2022-12-30: qty 1512

## 2022-12-30 MED ORDER — INSULIN ASPART 100 UNIT/ML IJ SOLN
0.0000 [IU] | INTRAMUSCULAR | Status: DC
  Administered 2022-12-30 (×4): 4 [IU] via SUBCUTANEOUS
  Administered 2022-12-31 (×2): 3 [IU] via SUBCUTANEOUS
  Administered 2022-12-31 (×3): 4 [IU] via SUBCUTANEOUS
  Administered 2023-01-01: 3 [IU] via SUBCUTANEOUS
  Administered 2023-01-01: 7 [IU] via SUBCUTANEOUS
  Administered 2023-01-01: 4 [IU] via SUBCUTANEOUS
  Administered 2023-01-01 (×3): 3 [IU] via SUBCUTANEOUS
  Administered 2023-01-02 (×3): 4 [IU] via SUBCUTANEOUS
  Administered 2023-01-02: 11 [IU] via SUBCUTANEOUS
  Administered 2023-01-02 – 2023-01-03 (×5): 3 [IU] via SUBCUTANEOUS
  Administered 2023-01-03: 4 [IU] via SUBCUTANEOUS
  Administered 2023-01-03: 3 [IU] via SUBCUTANEOUS
  Administered 2023-01-03: 4 [IU] via SUBCUTANEOUS
  Administered 2023-01-04 – 2023-01-05 (×5): 3 [IU] via SUBCUTANEOUS
  Administered 2023-01-05: 6 [IU] via SUBCUTANEOUS

## 2022-12-30 NOTE — Progress Notes (Signed)
[facility] Progress Note  5 Days Post-Op  Subjective: CC:  2 of levo, 0.4 of vaso  Per RN no bloody BMs overnight, NGT clearing up.  On CRRT  Objective: Vital signs in last 24 hours: Temp:  [98.2 F (36.8 C)-100.8 F (38.2 C)] 98.2 F (36.8 C) (09/21 0800) Pulse Rate:  [48-122] 64 (09/21 0800) Resp:  [9-31] 9 (09/21 0800) BP: (75-139)/(46-85) 127/63 (09/21 0800) SpO2:  [98 %-100 %] 99 % (09/21 0800) FiO2 (%):  [40 %] 40 % (09/21 0400) Weight:  [101 kg] 101 kg (09/21 0400) Last BM Date : 12/29/22  Intake/Output from previous day: 09/20 0701 - 09/21 0700 In: 5860.6 [I.V.:3851.5; Blood:965; NG/GT:60; IV Piggyback:984.1] Out: 4083.6 [Urine:310; Emesis/NG output:370] Intake/Output this shift: No intake/output data recorded.  PE: Gen:  intubated Card:  tachycardic Pulm:  ventilated respirations, tachypnea  Abd: distended but soft, midline dressing c/d/i GU: Clear yellow urine in foley bag Skin: warm and dry Psych:unable to assess  Lab Results:  Recent Labs    12/29/22 2333 12/30/22 0458  WBC 29.8* 33.0*  HGB 7.5* 9.0*  HCT 23.2* 26.8*  PLT 185 188   BMET Recent Labs    12/29/22 1558 12/30/22 0458  NA 136 134*  K 4.2 4.5  CL 96* 96*  CO2 26 23  GLUCOSE 215* 227*  BUN 85* 78*  CREATININE 3.57* 2.91*  CALCIUM 7.9* 7.9*   PT/INR Recent Labs    12/29/22 0809  LABPROT 20.1*  INR 1.7*   CMP     Component Value Date/Time   NA 134 (L) 12/30/2022 0458   K 4.5 12/30/2022 0458   CL 96 (L) 12/30/2022 0458   CO2 23 12/30/2022 0458   GLUCOSE 227 (H) 12/30/2022 0458   BUN 78 (H) 12/30/2022 0458   CREATININE 2.91 (H) 12/30/2022 0458   CALCIUM 7.9 (L) 12/30/2022 0458   PROT 5.1 (L) 12/28/2022 0419   PROT 6.3 03/16/2020 0800   ALBUMIN 1.8 (L) 12/30/2022 0458   ALBUMIN 4.3 03/16/2020 0800   AST 46 (H) 12/28/2022 0419   ALT 22 12/28/2022 0419   ALKPHOS 75 12/28/2022 0419   BILITOT 1.0 12/28/2022 0419   BILITOT 0.8 03/16/2020 0800    GFRNONAA 23 (L) 12/30/2022 0458   GFRAA >60 04/14/2015 1127   Lipase     Component Value Date/Time   LIPASE 28 11/27/2022 0755       Studies/Results: CT ANGIO GI BLEED  Result Date: 12/29/2022 CLINICAL DATA:  Pneumonia.  Mesenteric ischemia.  GI bleed EXAM: CTA ABDOMEN AND PELVIS WITHOUT AND WITH CONTRAST CT chest without contrast TECHNIQUE: Multidetector CT imaging of the abdomen and pelvis was performed using the standard protocol during bolus administration of intravenous contrast. Multiplanar reconstructed images and MIPs were obtained and reviewed to evaluate the vascular anatomy. Precontrast CT images were obtained of the chest as well with sagittal and coronal reformatted datasets. RADIATION DOSE REDUCTION: This exam was performed according to the departmental dose-optimization program which includes automated exposure control, adjustment of the mA and/or kV according to patient size and/or use of iterative reconstruction technique. CONTRAST:  75mL OMNIPAQUE IOHEXOL 350 MG/ML SOLN COMPARISON:  Chest x-ray 12/29/2022 earlier. CT angiogram 12/21/2022 and older FINDINGS: Chest: Cardiovascular: Heart is nonenlarged. No significant pericardial effusion. The thoracic aorta has a normal course and caliber with scattered vascular calcifications. Significant coronary artery calcifications. Bilateral IJ catheters in place with tip along the central SVC. Of note there is some air in the left side of the  neck anterior to the left IJ catheter. Mediastinum: Enteric tube in place with tip extending into the stomach. Small thyroid gland. No abnormal lymph node enlargement seen in the axillary regions, hilum or mediastinum on this noncontrast examination. Lungs and pleura: Breathing motion seen throughout the examination. There is some linear opacity seen along the lung bases likely scar or atelectasis. Few mild areas of ground-glass, nonspecific. Please correlate for any evidence of air trapping. There is a 4  mm nodule in the lingula on series 8, image 77 which is noncalcified. At most trace pleural fluid. No pneumothorax. Musculoskeletal: Moderate degenerative changes of the spine with bridging osteophytes. No fracture or dislocation. Abdomen pelvis: VASCULAR Aorta: Extensive vascular calcifications along the abdominal aorta. Small components of noncalcified plaque or thrombus as well. No dissection or aneurysm formation. Celiac: Mild calcified plaque.  Standard branching pattern SMA: Stent along the origin of the SMA. There is significant calcified plaque along the course of the SMA. The stent is grossly patent. The mid to distal branches of the SMA are extensively calcified limiting evaluation for subtle areas of stenosis or vessel occlusion. Previously there was an area of presumed vessel distal branch occlusion along the secondary branch of the SMA. This portion of the vessel on coronal series 10, image 105 appears to be more enhancing. What are seen of the tertiary branches appear to be preserved but several are atretic. Caliber of the vessels is smaller than the prior examination. Please correlate for areas of spasm. Renals: Renal arteries are atretic. Significant calcified plaque at their origins. Possible high-grade stenosis. Please correlate for level of renal dysfunction and hypertension. IMA: Proximally occluded. Inflow: Prominent calcified plaque along the common iliacs with some noncalcified plaque and thrombus along the right common iliac. There is also significant disease along the internal iliacs. The right external iliac is occluded at its origin with reconstitution distally just proximal to the common femoral. Left external iliac has a minimal disease. Proximal Outflow: Moderate plaque along the right common femoral with some mild stenosis. Veins: No obvious venous abnormality within the limitations of this arterial phase study. Review of the MIP images confirms the above findings. NON-VASCULAR  Hepatobiliary: No space-occupying liver lesion. Patent portal vein. Previous cholecystectomy. Pancreas: Unremarkable. No pancreatic ductal dilatation or surrounding inflammatory changes. Spleen: Normal in size without focal abnormality. Adrenals/Urinary Tract: Adrenal glands are preserved. There is moderate atrophy of the right kidney. Punctate lower pole stone. Bilateral Bosniak 1 and 2 renal cysts. No imaging follow-up. Foley catheter in the contracted urinary bladder. Stomach/Bowel: Enteric tube in the stomach. The stomach and duodenum are nondilated. There are dilated loops of jejunum in the left midabdomen with surgical changes. Areas of wall thickening. Please correlate with interval bowel resection. Large bowel is of normal course and caliber. There are some areas of large bowel which are collapsed with mild wall thickening. There is significant stool in the rectum. No definite areas of active extravasation along the bowel. There are some luminal high density along loops of colon anterior left but these are high density on precontrast and likely luminal debris. Similar areas along loops of small bowel in the right midabdomen. On portal venous phase imaging overall bowel walls are nonenhancing as would normally expect. Lymphatic: No abnormal lymph node enlargement identified in the abdomen and pelvis. Reproductive: Prostate is unremarkable. Other: Anasarca. Scattered mild free fluid. Mesenteric stranding. Few bubbles of free air but patient is postop since December 21, 2022 Musculoskeletal: Moderate degenerative changes seen of the  spine and pelvis. Transitional lumbosacral segment. IMPRESSION: VASCULAR Interval placement of the stent along the SMA with improved ostial stenosis. In addition the area of thrombus or vascular occlusion along the secondary branch of the SMA, ileocolic artery, appears to be improved today with more enhancement and flow in this location. Significant calcified plaque elsewhere  throughout the abdomen and pelvis. Once again there is significant calcified plaque along the origin of the renal arteries. Please correlate for patient's level of hypertension and renal function. Occlusion of the right external iliac artery with distal reconstitution. No areas of active extravasation of contrast at this time. There is some high density luminal material in the colon which was present on the precontrast dataset. NON-VASCULAR Interval postsurgical changes with some bowel resection along the sigmoid colon left midabdomen. Loops of bowel in this location demonstrate increased dilatation, ill-defined margins and wall thickening. In addition on the delayed dataset the level of wall enhancement is less than usually seen nor expected. No pneumatosis or portal venous gas. Of note the vasculature throughout the abdomen pelvis appears more atretic. Please correlate for any history including evidence of vaso spasm. Scattered free air or free fluid which could be postsurgical. Mild bilateral basilar lung atelectasis and ground-glass. No consolidation. At most trace pleural fluid. 4 mm left-sided lung nodule. If patient is low risk for malignancy, no routine follow-up imaging is recommended. If patient is high risk for malignancy, a non-contrast chest CT at 12 months is optional.This recommendation follows the consensus statement: Guidelines for Management of Incidental Pulmonary Nodules Detected on CT Images: From the Fleischner Society 2017; Radiology 2017; [id number]. Critical Value/emergent results were called by telephone at the time of interpretation on 12/29/2022 at 8:40 am to provider [facility] , who verbally acknowledged these results. Electronically Signed   By: Karen Kays M.D.   On: 12/29/2022 11:49   CT CHEST WO CONTRAST  Result Date: 12/29/2022 CLINICAL DATA:  Pneumonia.  Mesenteric ischemia.  GI bleed EXAM: CTA ABDOMEN AND PELVIS WITHOUT AND WITH CONTRAST CT chest without contrast  TECHNIQUE: Multidetector CT imaging of the abdomen and pelvis was performed using the standard protocol during bolus administration of intravenous contrast. Multiplanar reconstructed images and MIPs were obtained and reviewed to evaluate the vascular anatomy. Precontrast CT images were obtained of the chest as well with sagittal and coronal reformatted datasets. RADIATION DOSE REDUCTION: This exam was performed according to the departmental dose-optimization program which includes automated exposure control, adjustment of the mA and/or kV according to patient size and/or use of iterative reconstruction technique. CONTRAST:  75mL OMNIPAQUE IOHEXOL 350 MG/ML SOLN COMPARISON:  Chest x-ray 12/29/2022 earlier. CT angiogram 12/21/2022 and older FINDINGS: Chest: Cardiovascular: Heart is nonenlarged. No significant pericardial effusion. The thoracic aorta has a normal course and caliber with scattered vascular calcifications. Significant coronary artery calcifications. Bilateral IJ catheters in place with tip along the central SVC. Of note there is some air in the left side of the neck anterior to the left IJ catheter. Mediastinum: Enteric tube in place with tip extending into the stomach. Small thyroid gland. No abnormal lymph node enlargement seen in the axillary regions, hilum or mediastinum on this noncontrast examination. Lungs and pleura: Breathing motion seen throughout the examination. There is some linear opacity seen along the lung bases likely scar or atelectasis. Few mild areas of ground-glass, nonspecific. Please correlate for any evidence of air trapping. There is a 4 mm nodule in the lingula on series 8, image 77 which is noncalcified. At most  trace pleural fluid. No pneumothorax. Musculoskeletal: Moderate degenerative changes of the spine with bridging osteophytes. No fracture or dislocation. Abdomen pelvis: VASCULAR Aorta: Extensive vascular calcifications along the abdominal aorta. Small components of  noncalcified plaque or thrombus as well. No dissection or aneurysm formation. Celiac: Mild calcified plaque.  Standard branching pattern SMA: Stent along the origin of the SMA. There is significant calcified plaque along the course of the SMA. The stent is grossly patent. The mid to distal branches of the SMA are extensively calcified limiting evaluation for subtle areas of stenosis or vessel occlusion. Previously there was an area of presumed vessel distal branch occlusion along the secondary branch of the SMA. This portion of the vessel on coronal series 10, image 105 appears to be more enhancing. What are seen of the tertiary branches appear to be preserved but several are atretic. Caliber of the vessels is smaller than the prior examination. Please correlate for areas of spasm. Renals: Renal arteries are atretic. Significant calcified plaque at their origins. Possible high-grade stenosis. Please correlate for level of renal dysfunction and hypertension. IMA: Proximally occluded. Inflow: Prominent calcified plaque along the common iliacs with some noncalcified plaque and thrombus along the right common iliac. There is also significant disease along the internal iliacs. The right external iliac is occluded at its origin with reconstitution distally just proximal to the common femoral. Left external iliac has a minimal disease. Proximal Outflow: Moderate plaque along the right common femoral with some mild stenosis. Veins: No obvious venous abnormality within the limitations of this arterial phase study. Review of the MIP images confirms the above findings. NON-VASCULAR Hepatobiliary: No space-occupying liver lesion. Patent portal vein. Previous cholecystectomy. Pancreas: Unremarkable. No pancreatic ductal dilatation or surrounding inflammatory changes. Spleen: Normal in size without focal abnormality. Adrenals/Urinary Tract: Adrenal glands are preserved. There is moderate atrophy of the right kidney. Punctate  lower pole stone. Bilateral Bosniak 1 and 2 renal cysts. No imaging follow-up. Foley catheter in the contracted urinary bladder. Stomach/Bowel: Enteric tube in the stomach. The stomach and duodenum are nondilated. There are dilated loops of jejunum in the left midabdomen with surgical changes. Areas of wall thickening. Please correlate with interval bowel resection. Large bowel is of normal course and caliber. There are some areas of large bowel which are collapsed with mild wall thickening. There is significant stool in the rectum. No definite areas of active extravasation along the bowel. There are some luminal high density along loops of colon anterior left but these are high density on precontrast and likely luminal debris. Similar areas along loops of small bowel in the right midabdomen. On portal venous phase imaging overall bowel walls are nonenhancing as would normally expect. Lymphatic: No abnormal lymph node enlargement identified in the abdomen and pelvis. Reproductive: Prostate is unremarkable. Other: Anasarca. Scattered mild free fluid. Mesenteric stranding. Few bubbles of free air but patient is postop since December 21, 2022 Musculoskeletal: Moderate degenerative changes seen of the spine and pelvis. Transitional lumbosacral segment. IMPRESSION: VASCULAR Interval placement of the stent along the SMA with improved ostial stenosis. In addition the area of thrombus or vascular occlusion along the secondary branch of the SMA, ileocolic artery, appears to be improved today with more enhancement and flow in this location. Significant calcified plaque elsewhere throughout the abdomen and pelvis. Once again there is significant calcified plaque along the origin of the renal arteries. Please correlate for patient's level of hypertension and renal function. Occlusion of the right external iliac artery with distal reconstitution.  No areas of active extravasation of contrast at this time. There is some high  density luminal material in the colon which was present on the precontrast dataset. NON-VASCULAR Interval postsurgical changes with some bowel resection along the sigmoid colon left midabdomen. Loops of bowel in this location demonstrate increased dilatation, ill-defined margins and wall thickening. In addition on the delayed dataset the level of wall enhancement is less than usually seen nor expected. No pneumatosis or portal venous gas. Of note the vasculature throughout the abdomen pelvis appears more atretic. Please correlate for any history including evidence of vaso spasm. Scattered free air or free fluid which could be postsurgical. Mild bilateral basilar lung atelectasis and ground-glass. No consolidation. At most trace pleural fluid. 4 mm left-sided lung nodule. If patient is low risk for malignancy, no routine follow-up imaging is recommended. If patient is high risk for malignancy, a non-contrast chest CT at 12 months is optional.This recommendation follows the consensus statement: Guidelines for Management of Incidental Pulmonary Nodules Detected on CT Images: From the Fleischner Society 2017; Radiology 2017; [phone]. Critical Value/emergent results were called by telephone at the time of interpretation on 12/29/2022 at 8:40 am to provider [facility] , who verbally acknowledged these results. Electronically Signed   By: Karen Kays M.D.   On: 12/29/2022 11:49   CT HEAD WO CONTRAST ( )  Result Date: 12/29/2022 CLINICAL DATA:  67 year old male with altered mental status. EXAM: CT HEAD WITHOUT CONTRAST TECHNIQUE: Contiguous axial images were obtained from the base of the skull through the vertex without intravenous contrast. RADIATION DOSE REDUCTION: This exam was performed according to the departmental dose-optimization program which includes automated exposure control, adjustment of the mA and/or kV according to patient size and/or use of iterative reconstruction technique. COMPARISON:  None  Available. FINDINGS: Brain: Cerebral volume is within normal limits for age. No midline shift, ventriculomegaly, mass effect, evidence of mass lesion, intracranial hemorrhage or evidence of cortically based acute infarction. Gray-white matter differentiation is within normal limits throughout the brain. Vascular: Calcified atherosclerosis at the skull base. No suspicious intracranial vascular hyperdensity. Skull: No acute osseous abnormality identified. Sinuses/Orbits: Right nasoenteric tube in place. Subtotal opacified right maxillary sinus with mild bubbly opacity. Bubbly opacity in the left sphenoid. Other paranasal sinuses are well aerated. Mild left mastoid effusion. Tympanic cavities remain clear. Other: No acute orbit or scalp soft tissue finding. IMPRESSION: 1. Normal for age noncontrast CT appearance of the brain. 2. Right nasoenteric tube in place. Mild paranasal sinus inflammation, left mastoid effusion. Electronically Signed   By: Odessa Fleming M.D.   On: 12/29/2022 09:48   DG CHEST PORT 1 VIEW  Result Date: 12/29/2022 CLINICAL DATA:  Endotracheally intubated. EXAM: PORTABLE CHEST 1 VIEW COMPARISON:  Radiograph 12/23/2022 FINDINGS: The endotracheal tube tip is at the level of the clavicular heads 6.7 cm from the carina. Tip of the enteric tube is below the diaphragm in the stomach. Unchanged positioning of bilateral central lines. Stable heart size and mediastinal contours. No pneumothorax. Improvement in bibasilar atelectasis without confluent airspace disease. No significant pleural effusion. IMPRESSION: 1. Stable support apparatus. 2. Improvement in bibasilar atelectasis. Electronically Signed   By: Narda Rutherford M.D.   On: 12/29/2022 09:46   DG Abd Portable 1V  Result Date: 12/28/2022 CLINICAL DATA:  NG Tube EXAM: PORTABLE ABDOMEN - 1 VIEW COMPARISON:  Abdominal radiograph 12/21/22 FINDINGS: Enteric tube courses below diaphragm with side hole and tip projecting over the expected location of the  stomach. Paucity of bowel gas  limits the ability to assess for obstruction, but within this limitation. No radio-opaque calculi or other significant radiographic abnormality are seen. Lung bases are clear IMPRESSION: Enteric tube courses below diaphragm with side hole and tip projecting over the expected location of the stomach. Electronically Signed   By: Lorenza Cambridge M.D.   On: 12/28/2022 12:24    Anti-infectives: Anti-infectives (From admission, onward)    Start     Dose/Rate Route Frequency Ordered Stop   12/30/22 1800  vancomycin (VANCOCIN) IVPB 1000 mg/200 mL premix        1,000 mg 200 mL/hr over 60 Minutes Intravenous Every 24 hours 12/29/22 1844     12/30/22 0600  vancomycin (VANCOCIN) IVPB 1000 mg/200 mL premix  Status:  Discontinued        1,000 mg 200 mL/hr over 60 Minutes Intravenous Every 12 hours 12/29/22 1842 12/29/22 1844   12/29/22 2200  meropenem (MERREM) 1 g in sodium chloride 0.9 % 100 mL IVPB  Status:  Discontinued        1 g 200 mL/hr over 30 Minutes Intravenous Every 12 hours 12/29/22 1705 12/29/22 1842   12/29/22 1930  meropenem (MERREM) 1 g in sodium chloride 0.9 % 100 mL IVPB        1 g 200 mL/hr over 30 Minutes Intravenous Every 8 hours 12/29/22 1842     12/29/22 1800  vancomycin (VANCOREADY) IVPB 2000 mg/400 mL        2,000 mg 200 mL/hr over 120 Minutes Intravenous  Once 12/29/22 1705 12/29/22 1958   12/23/22 1230  piperacillin-tazobactam (ZOSYN) IVPB 2.25 g        2.25 g 100 mL/hr over 30 Minutes Intravenous Every 8 hours 12/23/22 1139 12/28/22 2109   12/22/22 0000  piperacillin-tazobactam (ZOSYN) IVPB 3.375 g  Status:  Discontinued        3.375 g 12.5 mL/hr over 240 Minutes Intravenous Every 8 hours 12/21/22 2331 12/23/22 1139        Assessment/Plan 67 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 Karin Lieu, exploratory laparotomy and small bowel resection Dr. Janee Morn S/p re-exploration, replace  abthera FB 12/23/22   S/p re-exploration, small bowel resection with anastomosis x2, assessment of bowel perfusion with ICG, abdominal closure 9/16 Dr. Andrey Campanile - POD#5 - afebrile, HR 64, remains on pressors, rising WBC 33 from 28 - blood in NGT and per rectum seems to have slowed down/stopped. Hgb 9.0 from 7.5 yesterday s/p 1 u pRBC. CTA 9/20 reassuring, patent SMA and no signs of bowel ischemia. Small bowel dilation proximal to anastomosis no unexpected. - Zosyn complete, now broadened to vanc/cefemipime  - TPN per pharmacy - [facility] will follow closely, [facility] was considering extubation - I have requested that they hold off on this for today, would like to monitor hgb/bowel function/abd exam for at least another 24h.   LOS: 10 days   I reviewed nursing notes, Consultant nephrology, vascular notes, hospitalist notes, last 24 h vitals and pain scores, last 48 h intake and output, last 24 h labs and trends, and last 24 h imaging results.    Hosie Spangle, PA-C [facility] Please see Amion for pager number during day hours 7:00am-4:30pm

## 2022-12-30 NOTE — Progress Notes (Addendum)
Subjective  - POD #4  Remains intubated but follows commands   Physical Exam:  Response to questions. Extremities with Doppler signals bilaterally Abdominal dressing is dry       Assessment/Plan:  POD #4, status post SMA stent and thrombectomy with bowel resection  CT scan yesterday was performed for coffee-ground NG tube output.  No concerning features for recurrent mesenteric ischemia.  SMA appears patent.  General surgery is continuing to follow him closely.  White count was elevated today.  Pressors have been weaned down.  Continue with dialysis.  Continue broad-spectrum antibiotics  [name] 12/30/2022 9:34 AM --  Vitals:   12/30/22 0800 12/30/22 0900  BP: 127/63 (!) 110/59  Pulse: 64 64  Resp: (!) 9 (!) 8  Temp: 98.2 F (36.8 C) 98.2 F (36.8 C)  SpO2: 99% 99%    Intake/Output Summary (Last 24 hours) at 12/30/2022 0934 Last data filed at 12/30/2022 0900 Gross per 24 hour  Intake 5972.99 ml  Output 4643.1 ml  Net 1329.89 ml     Laboratory CBC    Component Value Date/Time   WBC 33.0 (H) 12/30/2022 0458   HGB 9.0 (L) 12/30/2022 0458   HCT 26.8 (L) 12/30/2022 0458   PLT 188 12/30/2022 0458    BMET    Component Value Date/Time   NA 134 (L) 12/30/2022 0458   K 4.5 12/30/2022 0458   CL 96 (L) 12/30/2022 0458   CO2 23 12/30/2022 0458   GLUCOSE 227 (H) 12/30/2022 0458   BUN 78 (H) 12/30/2022 0458   CREATININE 2.91 (H) 12/30/2022 0458   CALCIUM 7.9 (L) 12/30/2022 0458   GFRNONAA 23 (L) 12/30/2022 0458   GFRAA >60 04/14/2015 1127    COAG Lab Results  Component Value Date   INR 1.7 (H) 12/29/2022   No results found for: "PTT"  Antibiotics Anti-infectives (From admission, onward)    Start     Dose/Rate Route Frequency Ordered Stop   12/30/22 1800  vancomycin (VANCOCIN) IVPB 1000 mg/200 mL premix        1,000 mg 200 mL/hr over 60 Minutes Intravenous Every 24 hours 12/29/22 1844     12/30/22 0600  vancomycin (VANCOCIN) IVPB 1000  mg/200 mL premix  Status:  Discontinued        1,000 mg 200 mL/hr over 60 Minutes Intravenous Every 12 hours 12/29/22 1842 12/29/22 1844   12/29/22 2200  meropenem (MERREM) 1 g in sodium chloride 0.9 % 100 mL IVPB  Status:  Discontinued        1 g 200 mL/hr over 30 Minutes Intravenous Every 12 hours 12/29/22 1705 12/29/22 1842   12/29/22 1930  meropenem (MERREM) 1 g in sodium chloride 0.9 % 100 mL IVPB        1 g 200 mL/hr over 30 Minutes Intravenous Every 8 hours 12/29/22 1842     12/29/22 1800  vancomycin (VANCOREADY) IVPB 2000 mg/400 mL        2,000 mg 200 mL/hr over 120 Minutes Intravenous  Once 12/29/22 1705 12/29/22 1958   12/23/22 1230  piperacillin-tazobactam (ZOSYN) IVPB 2.25 g        2.25 g 100 mL/hr over 30 Minutes Intravenous Every 8 hours 12/23/22 1139 12/28/22 2109   12/22/22 0000  piperacillin-tazobactam (ZOSYN) IVPB 3.375 g  Status:  Discontinued        3.375 g 12.5 mL/hr over 240 Minutes Intravenous Every 8 hours 12/21/22 2331 12/23/22 1139        [name] [name]  [name] Cairo, M.D., [facility] Office: [phone] Pager:  [phone]

## 2022-12-30 NOTE — Progress Notes (Signed)
NAME:  FALON HARBOTTLE, MRN:  [id number], DOB:  12-Aug-1955, LOS: 10 ADMISSION DATE:  12/20/2022, CONSULTATION DATE: 9/12 REFERRING MD: Dr. Jacqulyn Bath, CHIEF COMPLAINT: Mesenteric ischemia  History of Present Illness:   67 year old male with past medical history as below, which is significant for intestinal stromal tumor removal in 2022, known mesenteric stenosis, GERD, hypertension, and hyperlipidemia.  In the few weeks prior to presentation on 9/11 he had been evaluated at several campuses for abdominal pain with noncontrasted CTs of the abdomen which were unrevealing.  He continued to have abdominal pain, weight loss, and poor p.o. intake.  He presented [name] on 9/11 and was admitted to the hospitalist for abdominal pain with concern for superior mesenteric artery thrombus.  He was started on heparin infusion.  Vascular surgery was consulted and ultimately a CT angiogram of the abdomen and pelvis was done 9/12 demonstrating acute occlusion of the distal SMA as well as occlusion of the inferior mesenteric artery.  He was taken to the operating room emergently and and was found to have an necrotic area of the jejunum.  This was resected, and SMA stent was placed, and the patient was left with an open abdomen.  He also remained on the mechanical ventilator postoperatively and PCCM was asked to admit for ventilator management.  Pertinent  Medical History   has a past medical history of Ankle fracture, Cancer (HCC) (1974), Cataract, GERD (gastroesophageal reflux disease), colonic polyps, Hyperlipidemia, Hypertension, Kidney stones, Patella fracture, Retinal detachment, Retinal tear of right eye, and Syncopal episodes (2008).   Significant Hospital Events: Including procedures, antibiotic start and stop dates in addition to other pertinent events   9/11 admitted with abdominal pain 9/12 CT demonstrating mesenteric occlusion, bowel resection and stent placed, to ICU on vent. Left in discontinuity  9/14  back to OR. Left in discontinuity as still concern about bowel integrity 9/16 back to OR  s sig areas of ischemia w/ 2 areas of perforation, another 66cm section of jejunum was resected and area of discontinuity was reanastomosed. Changed to argatroban post-op as unable to get heparin levels detectable  9/17 dropping RASS goal to 0 to -1, assessing for readiness to wean, serum creatinine and BUN still climbing some but making urine 9/18 iHD  9/20 rising pressor need, anemia: CTA abd: nothing emergent, transfused  Interim History / Subjective:  Hematochezia and hematemesis improved Pressor needs improved Converted to sinus rhythm  Objective   Blood pressure (!) 114/54, pulse (!) 57, temperature 98.2 F (36.8 C), resp. rate 15, height 5\' 11"  (1.803 m), weight 101 kg, SpO2 100%. CVP:  [3 mmHg-8 mmHg] 6 mmHg  Vent Mode: PRVC FiO2 (%):  [40 %] 40 % Set Rate:  [15 bmp] 15 bmp Vt Set:  [600 mL] 600 mL PEEP:  [5 cmH20] 5 cmH20 Plateau Pressure:  [14 cmH20-17 cmH20] 17 cmH20   Intake/Output Summary (Last 24 hours) at 12/30/2022 0737 Last data filed at 12/30/2022 0600 Gross per 24 hour  Intake 5860.59 ml  Output 4083.6 ml  Net 1776.99 ml   Filed Weights   12/28/22 0530 12/29/22 0500 12/30/22 0400  Weight: 100 kg 100.8 kg 101 kg    Examination: Starting to wake up on vent Pupils equal Moving to command Lungs pretty clear Abd soft, hypoactive BS More bilious than bloody output this am Ext warm On CRRT  BMP ok WBC up sightly Hgb responded to transfusion; lactate yesterday okay  Resolved Hospital Problem list     Assessment &  Plan:   Acute mesenteric ischemia: Status post small bowel resection and superior mesenteric artery stent placement 9/12.  Open abdomen, left in discontinuity since 9/12, another 66 cm of jejunum was resected on 9/16 prior to successful reanastomosis of the small bowel Postoperative ventilator management- current vent sedating benign Acute encephalopathy:  likely uremia and sedation related; CT head neg 9/20 AKI on CKD3: in context of mesenteric ischemia, fluid shifts and likely ATN.  On CRRT Hypokalemia Hyperglycemia Postop anemia- transfused 9/20 Protein calorie malnutrition : significant weight loss prior to this admission, now NPO Sacral decub/deep tissue wound. Still not at place where can be staged-WOC following Hx HTN, cataracts, GERD, HLD Shock state 9/20: query ABLA vs. Recurrent sepsis, restarted on Abx  - SAT/SBT - Wean pressors - CRRT even goal - Restarted on vanc/meropenem 9/20 - Argatroban for AC - Vent bundle - TPN - Appreciate ongoing VVS and CCS input  Best Practice (right click and "Reselect all SmartList Selections" daily)   Diet/type: TPN DVT prophylaxis: argatroban GI prophylaxis: PPI Lines: Central line and Dialysis Catheter Foley:  Yes, and it is still needed Code Status:  full code Last date of multidisciplinary goals of care discussion [updated wife at bedside 9/20]  35 min cc time Myrla Halsted MD [facility] 12/30/2022, 7:37 AM  Please see [email or web address] for pager details.  From 7A-7P if no response, please call [phone]. After hours, please call ELink [phone].

## 2022-12-30 NOTE — Progress Notes (Addendum)
ANTICOAGULATION CONSULT NOTE - Follow Up  Pharmacy Consult for heparin >> argatroban Indication: SMA stenosis/occlusion  Allergies  Allergen Reactions   Zestril [Lisinopril] Cough    Patient Measurements: Height: 5\' 11"  (180.3 cm) Weight: 101 kg (222 lb 10.6 oz) IBW/kg (Calculated) : 75.3 Heparin Dosing Weight: 94.6kg  Vital Signs: Temp: 98.2 F (36.8 C) (09/21 0630) Temp Source: Rectal (09/21 0400) BP: 114/54 (09/21 0630) Pulse Rate: 57 (09/21 0630)  Labs: Recent Labs    12/29/22 0502 12/29/22 0732 12/29/22 0809 12/29/22 1249 12/29/22 1558 12/29/22 2333 12/30/22 0458  HGB  --    < >  --  8.9* 9.3* 7.5* 9.0*  HCT  --    < >  --  27.1* 28.9* 23.2* 26.8*  PLT  --    < >  --  223 243 185 188  APTT 89*  --   --  47*  --   --  42*  LABPROT  --   --  20.1*  --   --   --   --   INR  --   --  1.7*  --   --   --   --   CREATININE 4.17*  --   --   --  3.57*  --  2.91*   < > = values in this interval not displayed.    Estimated Creatinine Clearance: 29.4 mL/min (A) (by C-G formula based on SCr of 2.91 mg/dL (H)).   Assessment: 89 YOM presenting with abdominal pain found to have mesenteric ischemia. Pharmacy consulted for IV heparin. No AC PTA. Pt s/p ex lap, bowel resection, thrombectomy, and angioplasty 9/12, heparin resumed postop.  Despite multiple rate increases, unable to get heparin to a detectable level, so patient was transitioned to direct thrombin inhibitor argatroban.  Dark OG output and increased pressor need 9/20 so argatroban reduced with goal aPTT ~50 seconds. CT negative for acute bleed, OG output still dark, CBC stable. Will continue to target aPTT ~50 for now, 42 this morning.  Goal of Therapy:  aPTT 50-90 seconds >> aPTT goal ~50 seconds w/ bleeding Monitor platelets by anticoagulation protocol: Yes   Plan:  Increase argatroban at 0.12 mcg/kg/min  Daily aPTT, CBC  Fredonia Highland, PharmD, BCPS, [facility] Clinical Pharmacist [phone] Please check AMION  for all [facility] numbers 12/30/2022

## 2022-12-30 NOTE — Progress Notes (Signed)
[facility] NEPHROLOGY PROGRESS NOTE  Assessment/ Plan: Pt is a 67 y.o. 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   # Acute kidney injury on CKD IIIb: Baseline creatinine level around 2.0.  Acute kidney injury likely ischemic ATN due to ex lap surgery/IV contrast/hypotension -remains non-oliguric. Agree with not intervening on right RAS given that his right kidney is already much smaller than anticipated. Likely not much function being contributed from right. -Given poor mental status despite being off sedation and worsening kidney function, started HD on 9/18 after discussing with patient's wife. S/p IHD #2 9/19. Transitioned to CRRT 9/20 given worsening hemodynamics and given that he received contrast (which was absolutely necessary) -c/w CRRT, c/w with goal of net neg 50cc/hr, can target net neg 100cc/hr if hemodynamically stable -Strict ins and out and close lab monitoring.   # Acute mesenteric ischemia: Status post small bowel resection and superior mesenteric artery stent placement.  Followed by vascular surgeon and currently on agatroban since there was inadequate response to heparin. S/p OR again 9/16 for exlap and further small bowel resection, abd closed. S/p CTA 9/20 without anything emergent   # Vent dependent respiratory failure: Per PCCM. RRT for clearance. Working towards extubation   # Anemia likely due to blood loss: CT angio planned   # Shock -pressor support per primary service  -abx per primary service, broadened to vanc/cefepime  Discussed with ICU RN and primary service.  Anthony Sar, MD [facility]  Subjective: Seen and examined at bedside. No acute events. Still with some coffee ground output  NGT Pressors: NE, vaso Net positive ~1.77 Uop down to 0.3L  Objective Vital signs in last 24 hours: Vitals:   12/30/22 0600 12/30/22 0630 12/30/22 0700 12/30/22 0800  BP: (!) 110/54 (!) 114/54 (!) 105/50 127/63  Pulse: (!) 55 (!) 57 (!) 55 64  Resp: 15 15 13  (!) 9  Temp: 98.2 F (36.8 C) 98.2 F (36.8 C) 98.2 F (36.8 C) 98.2 F (36.8 C)  TempSrc:    Bladder  SpO2: 99% 100% 100% 99%  Weight:      Height:       Weight change: 0.2 kg  Intake/Output Summary (Last 24 hours) at 12/30/2022 0858 Last data filed at 12/30/2022 0800 Gross per 24 hour  Intake 5834.78 ml  Output 4223.1 ml  Net 1611.68 ml       Labs: RENAL PANEL Recent Labs  Lab 12/25/22 0345 12/26/22 0429 12/27/22 0455 12/27/22 1242 12/28/22 0419 12/28/22 1507 12/29/22 0502 12/29/22 0806 12/29/22 1558 12/30/22 0458  NA 137 135 139 138 137 139 136 136 136 134*  K 3.7 4.1 3.5 3.4* 3.0* 3.6 3.6 3.7 4.2 4.5  CL 105 102 102 102 101 99 96*  --  96* 96*  CO2 22 21* 21* 23 25 26 24   --  26 23  GLUCOSE 123* 153* 157* 162* 162* 172* 247*  --  215* 227*  BUN 72* 86* 110* 116* 104* 117* 84*  --  85* 78*  CREATININE 6.97* 7.10* 7.05* 7.07* 5.77* 5.94* 4.17*  --  3.57* 2.91*  CALCIUM 7.9* 8.0* 8.3* 8.2* 8.0* 8.7* 8.0*  --  7.9* 7.9*  MG 2.2 2.0 2.1  --  2.0  --   --   --   --  2.4  PHOS 6.0* 6.0* 4.4 4.3 2.9  --  3.3  --  2.9 2.8  ALBUMIN 1.8*  --   --  1.7* 1.6*  --  1.8*  --  1.7* 1.8*    Liver Function Tests: Recent Labs  Lab 12/24/22 1937 12/25/22 0345 12/27/22 1242 12/28/22 0419 12/29/22 0502 12/29/22 1558 12/30/22 0458  AST 72* 79*  --  46*  --   --   --   ALT 29 32  --  22  --   --   --   ALKPHOS 38 45  --  75  --   --   --   BILITOT 0.6 0.7  --  1.0  --   --   --   PROT 4.6* 5.0*  --  5.1*  --   --   --   ALBUMIN 1.8* 1.8*   < > 1.6* 1.8* 1.7* 1.8*   < > = values in this interval not displayed.   No results for input(s): "LIPASE", "AMYLASE" in the last 168 hours. Recent Labs  Lab  12/27/22 1027  AMMONIA 12   CBC: Recent Labs    12/29/22 0806 12/29/22 1249 12/29/22 1558 12/29/22 2333 12/30/22 0458  HGB 9.9* 8.9* 9.3* 7.5* 9.0*  MCV  --  81.6 80.3 82.3 82.5    Cardiac Enzymes: No results for input(s): "CKTOTAL", "CKMB", "CKMBINDEX", "TROPONINI" in the last 168 hours. CBG: Recent Labs  Lab 12/29/22 0021 12/29/22 0330 12/29/22 1138 12/30/22 0502 12/30/22 0814  GLUCAP 162* 168* 187* 218* 197*    Iron Studies: No results for input(s): "IRON", "TIBC", "TRANSFERRIN", "FERRITIN" in the last 72 hours. Studies/Results: CT ANGIO GI BLEED  Result Date: 12/29/2022 CLINICAL DATA:  Pneumonia.  Mesenteric ischemia.  GI bleed EXAM: CTA ABDOMEN AND PELVIS WITHOUT AND WITH CONTRAST CT chest without contrast TECHNIQUE: Multidetector CT imaging of the abdomen and pelvis was performed using the standard protocol during bolus administration of intravenous contrast. Multiplanar reconstructed images and MIPs were obtained and reviewed to evaluate the vascular anatomy. Precontrast CT images were obtained of the chest as well with sagittal and coronal reformatted datasets. RADIATION DOSE REDUCTION: This exam was performed according to the departmental dose-optimization program which includes automated exposure control, adjustment of the mA and/or kV according to patient size and/or use of iterative reconstruction technique. CONTRAST:  75mL OMNIPAQUE IOHEXOL 350 MG/ML SOLN COMPARISON:  Chest x-ray 12/29/2022 earlier. CT angiogram 12/21/2022 and older FINDINGS: Chest: Cardiovascular: Heart is nonenlarged. No significant pericardial effusion. The thoracic aorta has a normal course and caliber with scattered vascular calcifications. Significant coronary artery calcifications. Bilateral IJ catheters in place with tip along the central SVC. Of note there is some air in the left side of the neck anterior to the left IJ catheter. Mediastinum: Enteric tube in place with tip extending into the  stomach. Small thyroid gland. No abnormal lymph node enlargement seen in the axillary regions, hilum or mediastinum on this noncontrast examination. Lungs and pleura: Breathing motion seen throughout the examination. There is some linear opacity seen along the lung bases likely scar or atelectasis. Few mild areas of ground-glass, nonspecific. Please correlate for any evidence of air trapping. There is a 4 mm nodule in the lingula on series 8, image 77 which is noncalcified. At most trace pleural fluid. No pneumothorax. Musculoskeletal: Moderate degenerative changes of the spine with bridging osteophytes. No fracture or dislocation. Abdomen pelvis: VASCULAR Aorta: Extensive vascular calcifications along the abdominal aorta. Small components of noncalcified plaque  or thrombus as well. No dissection or aneurysm formation. Celiac: Mild calcified plaque.  Standard branching pattern SMA: Stent along the origin of the SMA. There is significant calcified plaque along the course of the SMA. The stent is grossly patent. The mid to distal branches of the SMA are extensively calcified limiting evaluation for subtle areas of stenosis or vessel occlusion. Previously there was an area of presumed vessel distal branch occlusion along the secondary branch of the SMA. This portion of the vessel on coronal series 10, image 105 appears to be more enhancing. What are seen of the tertiary branches appear to be preserved but several are atretic. Caliber of the vessels is smaller than the prior examination. Please correlate for areas of spasm. Renals: Renal arteries are atretic. Significant calcified plaque at their origins. Possible high-grade stenosis. Please correlate for level of renal dysfunction and hypertension. IMA: Proximally occluded. Inflow: Prominent calcified plaque along the common iliacs with some noncalcified plaque and thrombus along the right common iliac. There is also significant disease along the internal iliacs. The  right external iliac is occluded at its origin with reconstitution distally just proximal to the common femoral. Left external iliac has a minimal disease. Proximal Outflow: Moderate plaque along the right common femoral with some mild stenosis. Veins: No obvious venous abnormality within the limitations of this arterial phase study. Review of the MIP images confirms the above findings. NON-VASCULAR Hepatobiliary: No space-occupying liver lesion. Patent portal vein. Previous cholecystectomy. Pancreas: Unremarkable. No pancreatic ductal dilatation or surrounding inflammatory changes. Spleen: Normal in size without focal abnormality. Adrenals/Urinary Tract: Adrenal glands are preserved. There is moderate atrophy of the right kidney. Punctate lower pole stone. Bilateral Bosniak 1 and 2 renal cysts. No imaging follow-up. Foley catheter in the contracted urinary bladder. Stomach/Bowel: Enteric tube in the stomach. The stomach and duodenum are nondilated. There are dilated loops of jejunum in the left midabdomen with surgical changes. Areas of wall thickening. Please correlate with interval bowel resection. Large bowel is of normal course and caliber. There are some areas of large bowel which are collapsed with mild wall thickening. There is significant stool in the rectum. No definite areas of active extravasation along the bowel. There are some luminal high density along loops of colon anterior left but these are high density on precontrast and likely luminal debris. Similar areas along loops of small bowel in the right midabdomen. On portal venous phase imaging overall bowel walls are nonenhancing as would normally expect. Lymphatic: No abnormal lymph node enlargement identified in the abdomen and pelvis. Reproductive: Prostate is unremarkable. Other: Anasarca. Scattered mild free fluid. Mesenteric stranding. Few bubbles of free air but patient is postop since December 21, 2022 Musculoskeletal: Moderate degenerative  changes seen of the spine and pelvis. Transitional lumbosacral segment. IMPRESSION: VASCULAR Interval placement of the stent along the SMA with improved ostial stenosis. In addition the area of thrombus or vascular occlusion along the secondary branch of the SMA, ileocolic artery, appears to be improved today with more enhancement and flow in this location. Significant calcified plaque elsewhere throughout the abdomen and pelvis. Once again there is significant calcified plaque along the origin of the renal arteries. Please correlate for patient's level of hypertension and renal function. Occlusion of the right external iliac artery with distal reconstitution. No areas of active extravasation of contrast at this time. There is some high density luminal material in the colon which was present on the precontrast dataset. NON-VASCULAR Interval postsurgical changes with some bowel resection  along the sigmoid colon left midabdomen. Loops of bowel in this location demonstrate increased dilatation, ill-defined margins and wall thickening. In addition on the delayed dataset the level of wall enhancement is less than usually seen nor expected. No pneumatosis or portal venous gas. Of note the vasculature throughout the abdomen pelvis appears more atretic. Please correlate for any history including evidence of vaso spasm. Scattered free air or free fluid which could be postsurgical. Mild bilateral basilar lung atelectasis and ground-glass. No consolidation. At most trace pleural fluid. 4 mm left-sided lung nodule. If patient is low risk for malignancy, no routine follow-up imaging is recommended. If patient is high risk for malignancy, a non-contrast chest CT at 12 months is optional.This recommendation follows the consensus statement: Guidelines for Management of Incidental Pulmonary Nodules Detected on CT Images: From the Fleischner Society 2017; Radiology 2017; [phone]. Critical Value/emergent results were called by  telephone at the time of interpretation on 12/29/2022 at 8:40 am to provider [facility] , who verbally acknowledged these results. Electronically Signed   By: Karen Kays M.D.   On: 12/29/2022 11:49   CT CHEST WO CONTRAST  Result Date: 12/29/2022 CLINICAL DATA:  Pneumonia.  Mesenteric ischemia.  GI bleed EXAM: CTA ABDOMEN AND PELVIS WITHOUT AND WITH CONTRAST CT chest without contrast TECHNIQUE: Multidetector CT imaging of the abdomen and pelvis was performed using the standard protocol during bolus administration of intravenous contrast. Multiplanar reconstructed images and MIPs were obtained and reviewed to evaluate the vascular anatomy. Precontrast CT images were obtained of the chest as well with sagittal and coronal reformatted datasets. RADIATION DOSE REDUCTION: This exam was performed according to the departmental dose-optimization program which includes automated exposure control, adjustment of the mA and/or kV according to patient size and/or use of iterative reconstruction technique. CONTRAST:  75mL OMNIPAQUE IOHEXOL 350 MG/ML SOLN COMPARISON:  Chest x-ray 12/29/2022 earlier. CT angiogram 12/21/2022 and older FINDINGS: Chest: Cardiovascular: Heart is nonenlarged. No significant pericardial effusion. The thoracic aorta has a normal course and caliber with scattered vascular calcifications. Significant coronary artery calcifications. Bilateral IJ catheters in place with tip along the central SVC. Of note there is some air in the left side of the neck anterior to the left IJ catheter. Mediastinum: Enteric tube in place with tip extending into the stomach. Small thyroid gland. No abnormal lymph node enlargement seen in the axillary regions, hilum or mediastinum on this noncontrast examination. Lungs and pleura: Breathing motion seen throughout the examination. There is some linear opacity seen along the lung bases likely scar or atelectasis. Few mild areas of ground-glass, nonspecific. Please correlate  for any evidence of air trapping. There is a 4 mm nodule in the lingula on series 8, image 77 which is noncalcified. At most trace pleural fluid. No pneumothorax. Musculoskeletal: Moderate degenerative changes of the spine with bridging osteophytes. No fracture or dislocation. Abdomen pelvis: VASCULAR Aorta: Extensive vascular calcifications along the abdominal aorta. Small components of noncalcified plaque or thrombus as well. No dissection or aneurysm formation. Celiac: Mild calcified plaque.  Standard branching pattern SMA: Stent along the origin of the SMA. There is significant calcified plaque along the course of the SMA. The stent is grossly patent. The mid to distal branches of the SMA are extensively calcified limiting evaluation for subtle areas of stenosis or vessel occlusion. Previously there was an area of presumed vessel distal branch occlusion along the secondary branch of the SMA. This portion of the vessel on coronal series 10, image 105 appears to be  more enhancing. What are seen of the tertiary branches appear to be preserved but several are atretic. Caliber of the vessels is smaller than the prior examination. Please correlate for areas of spasm. Renals: Renal arteries are atretic. Significant calcified plaque at their origins. Possible high-grade stenosis. Please correlate for level of renal dysfunction and hypertension. IMA: Proximally occluded. Inflow: Prominent calcified plaque along the common iliacs with some noncalcified plaque and thrombus along the right common iliac. There is also significant disease along the internal iliacs. The right external iliac is occluded at its origin with reconstitution distally just proximal to the common femoral. Left external iliac has a minimal disease. Proximal Outflow: Moderate plaque along the right common femoral with some mild stenosis. Veins: No obvious venous abnormality within the limitations of this arterial phase study. Review of the MIP images  confirms the above findings. NON-VASCULAR Hepatobiliary: No space-occupying liver lesion. Patent portal vein. Previous cholecystectomy. Pancreas: Unremarkable. No pancreatic ductal dilatation or surrounding inflammatory changes. Spleen: Normal in size without focal abnormality. Adrenals/Urinary Tract: Adrenal glands are preserved. There is moderate atrophy of the right kidney. Punctate lower pole stone. Bilateral Bosniak 1 and 2 renal cysts. No imaging follow-up. Foley catheter in the contracted urinary bladder. Stomach/Bowel: Enteric tube in the stomach. The stomach and duodenum are nondilated. There are dilated loops of jejunum in the left midabdomen with surgical changes. Areas of wall thickening. Please correlate with interval bowel resection. Large bowel is of normal course and caliber. There are some areas of large bowel which are collapsed with mild wall thickening. There is significant stool in the rectum. No definite areas of active extravasation along the bowel. There are some luminal high density along loops of colon anterior left but these are high density on precontrast and likely luminal debris. Similar areas along loops of small bowel in the right midabdomen. On portal venous phase imaging overall bowel walls are nonenhancing as would normally expect. Lymphatic: No abnormal lymph node enlargement identified in the abdomen and pelvis. Reproductive: Prostate is unremarkable. Other: Anasarca. Scattered mild free fluid. Mesenteric stranding. Few bubbles of free air but patient is postop since December 21, 2022 Musculoskeletal: Moderate degenerative changes seen of the spine and pelvis. Transitional lumbosacral segment. IMPRESSION: VASCULAR Interval placement of the stent along the SMA with improved ostial stenosis. In addition the area of thrombus or vascular occlusion along the secondary branch of the SMA, ileocolic artery, appears to be improved today with more enhancement and flow in this location.  Significant calcified plaque elsewhere throughout the abdomen and pelvis. Once again there is significant calcified plaque along the origin of the renal arteries. Please correlate for patient's level of hypertension and renal function. Occlusion of the right external iliac artery with distal reconstitution. No areas of active extravasation of contrast at this time. There is some high density luminal material in the colon which was present on the precontrast dataset. NON-VASCULAR Interval postsurgical changes with some bowel resection along the sigmoid colon left midabdomen. Loops of bowel in this location demonstrate increased dilatation, ill-defined margins and wall thickening. In addition on the delayed dataset the level of wall enhancement is less than usually seen nor expected. No pneumatosis or portal venous gas. Of note the vasculature throughout the abdomen pelvis appears more atretic. Please correlate for any history including evidence of vaso spasm. Scattered free air or free fluid which could be postsurgical. Mild bilateral basilar lung atelectasis and ground-glass. No consolidation. At most trace pleural fluid. 4 mm left-sided lung nodule.  If patient is low risk for malignancy, no routine follow-up imaging is recommended. If patient is high risk for malignancy, a non-contrast chest CT at 12 months is optional.This recommendation follows the consensus statement: Guidelines for Management of Incidental Pulmonary Nodules Detected on CT Images: From the Fleischner Society 2017; Radiology 2017; [id number]. Critical Value/emergent results were called by telephone at the time of interpretation on 12/29/2022 at 8:40 am to provider [facility] , who verbally acknowledged these results. Electronically Signed   By: Karen Kays M.D.   On: 12/29/2022 11:49   CT HEAD WO CONTRAST ( )  Result Date: 12/29/2022 CLINICAL DATA:  67 year old male with altered mental status. EXAM: CT HEAD WITHOUT CONTRAST TECHNIQUE:  Contiguous axial images were obtained from the base of the skull through the vertex without intravenous contrast. RADIATION DOSE REDUCTION: This exam was performed according to the departmental dose-optimization program which includes automated exposure control, adjustment of the mA and/or kV according to patient size and/or use of iterative reconstruction technique. COMPARISON:  None Available. FINDINGS: Brain: Cerebral volume is within normal limits for age. No midline shift, ventriculomegaly, mass effect, evidence of mass lesion, intracranial hemorrhage or evidence of cortically based acute infarction. Gray-white matter differentiation is within normal limits throughout the brain. Vascular: Calcified atherosclerosis at the skull base. No suspicious intracranial vascular hyperdensity. Skull: No acute osseous abnormality identified. Sinuses/Orbits: Right nasoenteric tube in place. Subtotal opacified right maxillary sinus with mild bubbly opacity. Bubbly opacity in the left sphenoid. Other paranasal sinuses are well aerated. Mild left mastoid effusion. Tympanic cavities remain clear. Other: No acute orbit or scalp soft tissue finding. IMPRESSION: 1. Normal for age noncontrast CT appearance of the brain. 2. Right nasoenteric tube in place. Mild paranasal sinus inflammation, left mastoid effusion. Electronically Signed   By: Odessa Fleming M.D.   On: 12/29/2022 09:48   DG CHEST PORT 1 VIEW  Result Date: 12/29/2022 CLINICAL DATA:  Endotracheally intubated. EXAM: PORTABLE CHEST 1 VIEW COMPARISON:  Radiograph 12/23/2022 FINDINGS: The endotracheal tube tip is at the level of the clavicular heads 6.7 cm from the carina. Tip of the enteric tube is below the diaphragm in the stomach. Unchanged positioning of bilateral central lines. Stable heart size and mediastinal contours. No pneumothorax. Improvement in bibasilar atelectasis without confluent airspace disease. No significant pleural effusion. IMPRESSION: 1. Stable support  apparatus. 2. Improvement in bibasilar atelectasis. Electronically Signed   By: Narda Rutherford M.D.   On: 12/29/2022 09:46    Medications: Infusions:   prismasol BGK 4/2.5 500 mL/hr at 12/30/22 0745    prismasol BGK 4/2.5 400 mL/hr at 12/29/22 1016   albumin human Stopped (12/29/22 1757)   anticoagulant sodium citrate     argatroban 0.12 mcg/kg/min (12/30/22 0800)   fentaNYL infusion INTRAVENOUS 150 mcg/hr (12/30/22 0800)   meropenem (MERREM) IV Stopped (12/30/22 0545)   norepinephrine (LEVOPHED) Adult infusion 2 mcg/min (12/30/22 0800)   prismasol BGK 4/2.5 1,500 mL/hr at 12/30/22 0750   TPN ADULT (ION) 98 mL/hr at 12/30/22 0800   TPN ADULT (ION)     vancomycin     vasopressin 0.04 Units/min (12/30/22 0800)    Scheduled Medications:  sodium chloride   Intravenous Once   aspirin  300 mg Rectal Daily   Chlorhexidine Gluconate Cloth  6 each Topical Daily   hydrocortisone sod succinate (SOLU-CORTEF) inj  100 mg Intravenous Q8H   insulin aspart  0-20 Units Subcutaneous Q4H   mouth rinse  15 mL Mouth Rinse Q2H   pantoprazole (PROTONIX) IV  40 mg Intravenous Q12H   sodium chloride flush  3 mL Intravenous Q12H    have reviewed scheduled and prn medications.  Physical Exam: General: ill appearing, Intubated Heart:RRR, s1s2 nl Lungs: cta bilateral. intubated Abdomen: dressings in place, distended Extremities:No sig edema Neurology: unresponsive Dialysis access: RIJ temp HD catheter  [name] [name] 12/30/2022,8:58 AM  LOS: 10 days

## 2022-12-30 NOTE — Progress Notes (Signed)
PHARMACY - TOTAL PARENTERAL NUTRITION CONSULT NOTE  Indication:  SMA syndrome w associated weight loss  Patient Measurements: Height: 5\' 11"  (180.3 cm) Weight: 101 kg (222 lb 10.6 oz) IBW/kg (Calculated) : 75.3 TPN AdjBW (KG): 80.4 Body mass index is 31.06 kg/m. Usual Weight: 95 kg, has lost 50lbs recently   Assessment:  67 yo M admitted with abdominal pain, weight loss, and poor PO intake. 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 He is now s/p SMA stent placement. He was initially left is discontinuity with open abdomen and was closed on 9/16. TPN was initiated 9/14.   9/20 new  blood in NGT and per rectum, clinical concern for ischemia >> ruled out.  Glucose / Insulin: A1c 5.3% - CBGs < 180 on sSSI prior to steroid > added 9/20 PM > CBGs trending up in the 200s Used 21 units SSI in the past 24 hrs. Electrolytes: Na/CL low at 134/96, Phos down to 2.8 (none in TPN), others WNL (K trending up, CoCa 9.6 post 2gm, Mag 2.4 post 2gm) Renal: iHD x2 days, CRRT started 9/20 - SCr down 2.961, BUN down to 78 Hepatic: AST trending down, other LFTs/tbili / TG WNL, albumin 1.8  Intake / Output; MIVF: UOP 0.1 ml/kg/hr (last Lasix 9/14), NG down , wound vac output not reported for last 24 hrs, net +6.6L, LBM 9/8.  GI Imaging:  9/20 CTA: improved ostial stenosis post stenting, thrombus/occlusion of SMA/ileocolic artery improving, surgical site has increased dilatation, L lung nodule GI Surgeries / Procedures:  9/14 re-ex lap, left in discontinuity with plans for re-exploration  9/16 re-ex lap, SBR with anastomosis x2, abdominal closure Wounds (aside from abd): Deep tissue pressure injury to B/L buttocks  Central access: CVC LIJ 12/23/22 TPN start date: 12/23/22  Nutritional Goals: Goal TPN rate is 105 mL/hr (provides 147g AA and 2329 kCal per day) - adjusted 9/18 for increased estimated needs   RD Estimated Needs Total Energy  Estimated Needs: 2300-2500 kcals Total Protein Estimated Needs: 140 -150 g Total Fluid Estimated Needs: >/= 2.3 L  Current Nutrition:  TPN  Off TF 9/20 with concern for bowel ischemia > strict NPO  Plan:  Unable to concentrate TPN due to AA shortage Increase TPN volume to adjust content - TPN at a new goal rate of 105 ml/hr starting at 1800 to meet 100% of needs Electrolytes in TPN: increase Na to 181mEq/L, reduce K to 87mEq/L (~15 mEq/day), Ca 5 mEq/L, Mg 5 mEq/L, add Phos 5 mmol/L, Cl:Ac 1:1 for now Add standard MVI and trace elements to TPN No chromium due to RRT and on shortage Increase SSI to resistant Q4H while on steroid Monitor TPN labs on Mon/Thurs >> renal function panel BID per MD F/U with restarting trickle feed when able  Betsaida [place] D. Laney Potash, PharmD, BCPS, BCCCP 12/30/2022, 7:20 AM

## 2022-12-31 DIAGNOSIS — K551 Chronic vascular disorders of intestine: Secondary | ICD-10-CM | POA: Diagnosis not present

## 2022-12-31 LAB — RENAL FUNCTION PANEL
Albumin: 1.7 g/dL — ABNORMAL LOW (ref 3.5–5.0)
Albumin: 1.7 g/dL — ABNORMAL LOW (ref 3.5–5.0)
Anion gap: 12 (ref 5–15)
Anion gap: 10 (ref 5–15)
BUN: 75 mg/dL — ABNORMAL HIGH (ref 8–23)
BUN: 74 mg/dL — ABNORMAL HIGH (ref 8–23)
CO2: 23 mmol/L (ref 22–32)
CO2: 25 mmol/L (ref 22–32)
Calcium: 7.7 mg/dL — ABNORMAL LOW (ref 8.9–10.3)
Calcium: 7.6 mg/dL — ABNORMAL LOW (ref 8.9–10.3)
Chloride: 99 mmol/L (ref 98–111)
Chloride: 102 mmol/L (ref 98–111)
Creatinine, Ser: 2.36 mg/dL — ABNORMAL HIGH (ref 0.61–1.24)
Creatinine, Ser: 2.08 mg/dL — ABNORMAL HIGH (ref 0.61–1.24)
GFR, Estimated: 29 mL/min — ABNORMAL LOW (ref >60)
GFR, Estimated: 34 mL/min — ABNORMAL LOW (ref >60)
Glucose, Bld: 236 mg/dL — ABNORMAL HIGH (ref 70–99)
Glucose, Bld: 128 mg/dL — ABNORMAL HIGH (ref 70–99)
Phosphorus: 2.8 mg/dL (ref 2.5–4.6)
Phosphorus: 1.7 mg/dL — ABNORMAL LOW (ref 2.5–4.6)
Potassium: 4.1 mmol/L (ref 3.5–5.1)
Potassium: 3.7 mmol/L (ref 3.5–5.1)
Sodium: 134 mmol/L — ABNORMAL LOW (ref 135–145)
Sodium: 137 mmol/L (ref 135–145)

## 2022-12-31 LAB — CBC
HCT: 24.6 % — ABNORMAL LOW (ref 39.0–52.0)
HCT: 23.8 % — ABNORMAL LOW (ref 39.0–52.0)
Hemoglobin: 7.9 g/dL — ABNORMAL LOW (ref 13.0–17.0)
Hemoglobin: 7.7 g/dL — ABNORMAL LOW (ref 13.0–17.0)
MCH: 27.1 pg (ref 26.0–34.0)
MCH: 26.6 pg (ref 26.0–34.0)
MCHC: 32.1 g/dL (ref 30.0–36.0)
MCHC: 32.4 g/dL (ref 30.0–36.0)
MCV: 84.5 fL (ref 80.0–100.0)
MCV: 82.1 fL (ref 80.0–100.0)
Platelets: 175 10*3/uL (ref 150–400)
Platelets: 196 10*3/uL (ref 150–400)
RBC: 2.91 MIL/uL — ABNORMAL LOW (ref 4.22–5.81)
RBC: 2.9 MIL/uL — ABNORMAL LOW (ref 4.22–5.81)
RDW: 15.8 % — ABNORMAL HIGH (ref 11.5–15.5)
RDW: 15.9 % — ABNORMAL HIGH (ref 11.5–15.5)
WBC: 26.9 10*3/uL — ABNORMAL HIGH (ref 4.0–10.5)
WBC: 27 10*3/uL — ABNORMAL HIGH (ref 4.0–10.5)
nRBC: 0 % (ref 0.0–0.2)
nRBC: 0 % (ref 0.0–0.2)

## 2022-12-31 LAB — POCT I-STAT EG7
Acid-base deficit: 1 mmol/L (ref 0.0–2.0)
Bicarbonate: 23.7 mmol/L (ref 20.0–28.0)
Calcium, Ion: 1.15 mmol/L (ref 1.15–1.40)
HCT: 23 % — ABNORMAL LOW (ref 39.0–52.0)
Hemoglobin: 7.8 g/dL — ABNORMAL LOW (ref 13.0–17.0)
O2 Saturation: 76 %
Patient temperature: 37
Potassium: 3.4 mmol/L — ABNORMAL LOW (ref 3.5–5.1)
Sodium: 138 mmol/L (ref 135–145)
TCO2: 25 mmol/L (ref 22–32)
pCO2, Ven: 40.1 mmHg — ABNORMAL LOW (ref 44–60)
pH, Ven: 7.379 (ref 7.25–7.43)
pO2, Ven: 42 mmHg (ref 32–45)

## 2022-12-31 LAB — TYPE AND SCREEN
ABO/RH(D): A POS
Antibody Screen: NEGATIVE
Unit division: 0
Unit division: 0
Unit division: 0

## 2022-12-31 LAB — BPAM RBC
Blood Product Expiration Date: 202410012359
Blood Product Expiration Date: 202410062359
Blood Product Expiration Date: 202410072359
ISSUE DATE / TIME: 202409201000
ISSUE DATE / TIME: 202409201000
ISSUE DATE / TIME: 202409210057
Unit Type and Rh: 6200
Unit Type and Rh: 6200
Unit Type and Rh: 6200

## 2022-12-31 LAB — POCT ACTIVATED CLOTTING TIME
Activated Clotting Time: 195 seconds
Activated Clotting Time: 207 seconds
Activated Clotting Time: 214 seconds
Activated Clotting Time: 201 seconds
Activated Clotting Time: 189 seconds
Activated Clotting Time: 201 seconds
Activated Clotting Time: 201 seconds
Activated Clotting Time: 207 seconds

## 2022-12-31 LAB — GLUCOSE, CAPILLARY
Glucose-Capillary: 183 mg/dL — ABNORMAL HIGH (ref 70–99)
Glucose-Capillary: 183 mg/dL — ABNORMAL HIGH (ref 70–99)
Glucose-Capillary: 133 mg/dL — ABNORMAL HIGH (ref 70–99)
Glucose-Capillary: 111 mg/dL — ABNORMAL HIGH (ref 70–99)

## 2022-12-31 LAB — TRIGLYCERIDES: Triglycerides: 160 mg/dL — ABNORMAL HIGH (ref <150)

## 2022-12-31 LAB — MAGNESIUM: Magnesium: 2.5 mg/dL — ABNORMAL HIGH (ref 1.7–2.4)

## 2022-12-31 LAB — APTT: aPTT: 44 seconds — ABNORMAL HIGH (ref 24–36)

## 2022-12-31 MED ORDER — SODIUM CHLORIDE 0.9 % IV SOLN: 250.0000 [IU]/h | INTRAVENOUS | Status: DC

## 2022-12-31 MED ORDER — HEPARIN BOLUS VIA INFUSION (CRRT): 1000.0000 [IU] | INTRAVENOUS | Status: DC | PRN

## 2022-12-31 MED ORDER — HALOPERIDOL LACTATE 5 MG/ML IJ SOLN
2.0000 mg | Freq: Four times a day (QID) | INTRAMUSCULAR | Status: DC | PRN
  Administered 2022-12-31 – 2023-01-01 (×3): 2 mg via INTRAVENOUS
  Filled 2022-12-31 (×4): qty 1

## 2022-12-31 MED ORDER — TRAVASOL 10 % IV SOLN: INTRAVENOUS | Status: DC

## 2022-12-31 MED ORDER — ORAL CARE MOUTH RINSE: 15.0000 mL | OROMUCOSAL | Status: DC | PRN

## 2022-12-31 MED ORDER — PHENOL 1.4 % MT LIQD
1.0000 | OROMUCOSAL | Status: DC | PRN
  Administered 2022-12-31: 1 via OROMUCOSAL
  Filled 2022-12-31: qty 177

## 2022-12-31 MED ORDER — HYDROMORPHONE HCL 1 MG/ML IJ SOLN
0.5000 mg | INTRAMUSCULAR | Status: DC | PRN
  Administered 2022-12-31 – 2023-01-11 (×21): 1 mg via INTRAVENOUS
  Administered 2023-01-11: 2 mg via INTRAVENOUS
  Filled 2022-12-31 (×18): qty 1
  Filled 2022-12-31: qty 2
  Filled 2022-12-31 (×3): qty 1

## 2022-12-31 MED ORDER — SODIUM CHLORIDE 0.9 % IV SOLN
500.0000 [IU]/h | INTRAVENOUS | Status: DC
  Administered 2022-12-31: 500 [IU]/h via INTRAVENOUS_CENTRAL
  Administered 2023-01-01: 1000 [IU]/h via INTRAVENOUS_CENTRAL
  Administered 2023-01-01: 750 [IU]/h via INTRAVENOUS_CENTRAL
  Administered 2023-01-01: 1000 [IU]/h via INTRAVENOUS_CENTRAL
  Filled 2022-12-31 (×2): qty 2
  Filled 2022-12-31: qty 10000
  Filled 2022-12-31: qty 2

## 2022-12-31 MED ORDER — TRAVASOL 10 % IV SOLN
INTRAVENOUS | Status: AC
  Filled 2022-12-31: qty 1512

## 2022-12-31 MED ORDER — DEXMEDETOMIDINE HCL IN NACL 400 MCG/100ML IV SOLN: 0.0000 ug/kg/h | INTRAVENOUS | Status: DC

## 2022-12-31 NOTE — Progress Notes (Addendum)
ANTICOAGULATION CONSULT NOTE - Follow Up  Pharmacy Consult for heparin >> argatroban Indication: SMA stenosis/occlusion  Allergies  Allergen Reactions   Zestril [Lisinopril] Cough    Patient Measurements: Height: 5\' 11"  (180.3 cm) Weight: 101.2 kg (223 lb 1.7 oz) IBW/kg (Calculated) : 75.3 Heparin Dosing Weight: 94.6kg  Vital Signs: Temp: 98.2 F (36.8 C) (09/22 0800) Temp Source: Rectal (09/22 0400) BP: 146/66 (09/22 0800) Pulse Rate: 73 (09/22 0800)  Labs: Recent Labs    12/29/22 0809 12/29/22 1249 12/29/22 1558 12/30/22 0458 12/30/22 1600 12/31/22 0411  HGB  --  8.9*   < > 9.0* 8.9* 7.9*  HCT  --  27.1*   < > 26.8* 27.5* 24.6*  PLT  --  223   < > 188 185 175  APTT  --  47*  --  42*  --  44*  LABPROT 20.1*  --   --   --   --   --   INR 1.7*  --   --   --   --   --   CREATININE  --   --    < > 2.91* 2.62* 2.36*   < > = values in this interval not displayed.    Estimated Creatinine Clearance: 36.3 mL/min (A) (by C-G formula based on SCr of 2.36 mg/dL (H)).   Assessment: 68 YOM presenting with abdominal pain found to have mesenteric ischemia. Pharmacy consulted for IV heparin. No AC PTA. Pt s/p ex lap, bowel resection, thrombectomy, and angioplasty 9/12, heparin resumed postop.  Despite multiple rate increases, unable to get heparin to a detectable level, so patient was transitioned to direct thrombin inhibitor argatroban.  Dark OG output and increased pressor need 9/20 so argatroban reduced with goal aPTT ~50 seconds. CT negative for acute bleed, OG output more bilious now. aPTT subtherapeutic at 44 seconds.  Goal of Therapy:  aPTT 50-90 seconds  Monitor platelets by anticoagulation protocol: Yes   Plan:  Increase argatroban at 0.15 mcg/kg/min  Daily aPTT, CBC  ADDENDUM 1045: Adding fixed-rate heparin to CRRT circuit for ongoing clotting issues. Will continue argatroban plan as above as heparin previously remained undetectable on high doses.   Fredonia Highland, PharmD, BCPS, [facility] Clinical Pharmacist [phone] Please check AMION for all [facility] Pharmacy numbers 12/31/2022

## 2022-12-31 NOTE — Progress Notes (Signed)
    Subjective  - POD #5  Extubated this am Feels weak Wife at bedside   Physical Exam:  Faint pedal doppler signals Abd dressing dry Abd soft, slightly distended    Assessment/Plan:  POD #5  GI: appreciate gen surg assistance.  Remains NPO with NG F/E/N:  continue TPN PPULM:  extubated by CCM this am Renal: remain on CRRT, likely off on Monday Vasc:  asa and argatroban.  Will eventually need statin  [name] [name] 12/31/2022 10:51 AM --  Vitals:   12/31/22 0900 12/31/22 1000  BP: 137/67 128/65  Pulse: 80 87  Resp: 10 20  Temp: 98.6 F (37 C) 98.6 F (37 C)  SpO2: 100% 97%    Intake/Output Summary (Last 24 hours) at 12/31/2022 1051 Last data filed at 12/31/2022 1000 Gross per 24 hour  Intake 3866.89 ml  Output 3619.8 ml  Net 247.09 ml     Laboratory CBC    Component Value Date/Time   WBC 26.9 (H) 12/31/2022 0411   HGB 7.9 (L) 12/31/2022 0411   HCT 24.6 (L) 12/31/2022 0411   PLT 175 12/31/2022 0411    BMET    Component Value Date/Time   NA 134 (L) 12/31/2022 0411   K 4.1 12/31/2022 0411   CL 99 12/31/2022 0411   CO2 23 12/31/2022 0411   GLUCOSE 236 (H) 12/31/2022 0411   BUN 75 (H) 12/31/2022 0411   CREATININE 2.36 (H) 12/31/2022 0411   CALCIUM 7.7 (L) 12/31/2022 0411   GFRNONAA 29 (L) 12/31/2022 0411   GFRAA >60 04/14/2015 1127    COAG Lab Results  Component Value Date   INR 1.7 (H) 12/29/2022   No results found for: "PTT"  Antibiotics Anti-infectives (From admission, onward)    Start     Dose/Rate Route Frequency Ordered Stop   12/30/22 1800  vancomycin (VANCOCIN) IVPB 1000 mg/200 mL premix        1,000 mg 200 mL/hr over 60 Minutes Intravenous Every 24 hours 12/29/22 1844     12/30/22 0600  vancomycin (VANCOCIN) IVPB 1000 mg/200 mL premix  Status:  Discontinued        1,000 mg 200 mL/hr over 60 Minutes Intravenous Every 12 hours 12/29/22 1842 12/29/22 1844   12/29/22 2200  meropenem (MERREM) 1 g in sodium chloride 0.9 % 100 mL  IVPB  Status:  Discontinued        1 g 200 mL/hr over 30 Minutes Intravenous Every 12 hours 12/29/22 1705 12/29/22 1842   12/29/22 1930  meropenem (MERREM) 1 g in sodium chloride 0.9 % 100 mL IVPB        1 g 200 mL/hr over 30 Minutes Intravenous Every 8 hours 12/29/22 1842     12/29/22 1800  vancomycin (VANCOREADY) IVPB 2000 mg/400 mL        2,000 mg 200 mL/hr over 120 Minutes Intravenous  Once 12/29/22 1705 12/29/22 1958   12/23/22 1230  piperacillin-tazobactam (ZOSYN) IVPB 2.25 g        2.25 g 100 mL/hr over 30 Minutes Intravenous Every 8 hours 12/23/22 1139 12/28/22 2109   12/22/22 0000  piperacillin-tazobactam (ZOSYN) IVPB 3.375 g  Status:  Discontinued        3.375 g 12.5 mL/hr over 240 Minutes Intravenous Every 8 hours 12/21/22 2331 12/23/22 1139        V. Charlena Cross, M.D., [facility] [place] Office: [phone] Pager:  [phone]

## 2022-12-31 NOTE — Progress Notes (Signed)
[facility] Progress Note  6 Days Post-Op  Subjective: Following commands, on low dose levophed. No blood per rectum per RN, NG tube is nonbloody and bilious this morning.  Objective: Vital signs in last 24 hours: Temp:  [97.7 F (36.5 C)-98.4 F (36.9 C)] 98.2 F (36.8 C) (09/22 0800) Pulse Rate:  [50-76] 73 (09/22 0800) Resp:  [7-17] 11 (09/22 0800) BP: (92-150)/(50-71) 146/66 (09/22 0800) SpO2:  [97 %-100 %] 97 % (09/22 0800) FiO2 (%):  [40 %] 40 % (09/22 0717) Weight:  [101.2 kg] 101.2 kg (09/22 0400) Last BM Date : 12/30/22  Intake/Output from previous day: 09/21 0701 - 09/22 0700 In: 3912.6 [I.V.:3412.6; IV Piggyback:500] Out: 3766.2 [Urine:350; Emesis/NG output:30] Intake/Output this shift: Total I/O In: 280.9 [I.V.:280.9] Out: 235.8 [Urine:18]  PE: Gen:  intubated, follows commands HEENT: NG with bilious fluid Card:  RRR Pulm:  intubated, on vent Abd: mildly distended but soft, midline incision open at skin, wound is clean and dry.   Lab Results:  Recent Labs    12/30/22 1600 12/31/22 0411  WBC 30.1* 26.9*  HGB 8.9* 7.9*  HCT 27.5* 24.6*  PLT 185 175   BMET Recent Labs    12/30/22 1600 12/31/22 0411  NA 137 134*  K 4.4 4.1  CL 101 99  CO2 24 23  GLUCOSE 206* 236*  BUN 76* 75*  CREATININE 2.62* 2.36*  CALCIUM 8.0* 7.7*   PT/INR Recent Labs    12/29/22 0809  LABPROT 20.1*  INR 1.7*   CMP     Component Value Date/Time   NA 134 (L) 12/31/2022 0411   K 4.1 12/31/2022 0411   CL 99 12/31/2022 0411   CO2 23 12/31/2022 0411   GLUCOSE 236 (H) 12/31/2022 0411   BUN 75 (H) 12/31/2022 0411   CREATININE 2.36 (H) 12/31/2022 0411   CALCIUM 7.7 (L) 12/31/2022 0411   PROT 5.1 (L) 12/28/2022 0419   PROT 6.3 03/16/2020 0800   ALBUMIN 1.7 (L) 12/31/2022 0411   ALBUMIN 4.3 03/16/2020 0800   AST 46 (H) 12/28/2022 0419   ALT 22 12/28/2022 0419   ALKPHOS 75 12/28/2022 0419   BILITOT 1.0 12/28/2022 0419   BILITOT 0.8 03/16/2020 0800    GFRNONAA 29 (L) 12/31/2022 0411   GFRAA >60 04/14/2015 1127   Lipase     Component Value Date/Time   LIPASE 28 11/27/2022 0755       Assessment/Plan 67 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 Karin Lieu, exploratory laparotomy and small bowel resection Dr. Janee Morn S/p re-exploration, replace abthera FB 12/23/22   S/p re-exploration, small bowel resection with anastomosis x2, assessment of bowel perfusion with ICG, abdominal closure 9/16 Dr. Andrey Campanile - No signs of ongoing GI bleeding - Continue TPN - Discussed with CCM, planning for extubation today - Continue NG decompression and remain NPO. Awaiting bowel function.   LOS: 11 days   Sophronia Simas, MD [facility] Surgery General, Hepatobiliary and Pancreatic Surgery 12/31/22 9:00 AM

## 2022-12-31 NOTE — Progress Notes (Signed)
PHARMACY - TOTAL PARENTERAL NUTRITION CONSULT NOTE  Indication:  SMA syndrome w associated weight loss  Patient Measurements: Height: 5\' 11"  (180.3 cm) Weight: 101.2 kg (223 lb 1.7 oz) IBW/kg (Calculated) : 75.3 TPN AdjBW (KG): 80.4 Body mass index is 31.12 kg/m. Usual Weight: 95 kg, has lost 50lbs recently   Assessment:  67 yo M admitted with abdominal pain, weight loss, and poor PO intake. 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 He is now s/p SMA stent placement. He was initially left is discontinuity with open abdomen and was closed on 9/16. TPN was initiated 9/14.   9/20 new blood in NGT and per rectum, clinical concern for ischemia >> ruled out.   Glucose / Insulin: A1c 5.3% - CBGs < 180 on sSSI prior to steroid > added 9/20 PM > CBGs acceptable Used 21 units SSI in the past 24 hrs. Electrolytes: Na low at 134, Mag elevated at 2.5 (s/p 2gm on 9/20), others WNL Renal: iHD x2 days, CRRT started 9/20 - SCr down 2.36, BUN down to 75 Hepatic: AST trending down, other LFTs/tbili WNL, albumin 1.7, TG mildly elevated at 160 Intake / Output; MIVF: UOP 0.1 ml/kg/hr (last Lasix 9/14), NG down 30mL (coffee ground), wound vac output not reported for last 24 hrs, net +6.8L, LBM 9/20.  GI Imaging:  9/20 CTA: improved ostial stenosis post stenting, thrombus/occlusion of SMA/ileocolic artery improving, surgical site has increased dilatation, L lung nodule GI Surgeries / Procedures:  9/14 re-ex lap, left in discontinuity with plans for re-exploration  9/16 re-ex lap, SBR with anastomosis x2, abdominal closure Wounds (aside from abd): Deep tissue pressure injury to B/L buttocks  Central access: CVC LIJ 12/23/22 TPN start date: 12/23/22  Nutritional Goals: Goal TPN rate is 105 mL/hr (provides 147g AA and 2329 kCal per day)  RD Estimated Needs Total Energy Estimated Needs: 2300-2500 kcals Total Protein Estimated Needs: 140 -150  g Total Fluid Estimated Needs: >/= 2.3 L  Current Nutrition:  TPN  Off TF 9/20 with concern for bowel ischemia > NPO  Plan:  Unable to concentrate TPN due to AA shortage Continue TPN at goal rate of 105 ml/hr to meet 100% of needs Electrolytes in TPN: increase Na to 164mEq/L, reduce K to 61mEq/L on 9/21 (~15 mEq/day), Ca 5 mEq/L, reduce Mg to 4 mEq/L, add Phos 5 mmol/L on 9/21, Cl:Ac 1:1 Add standard MVI and trace elements to TPN No chromium due to RRT and on shortage Continue resistant SSI Q4H while on steroid.  Add long-acting insulin if needed. Monitor TPN labs on Mon/Thurs >> renal function panel BID per MD F/U with restarting trickle feed when able  Working toward extubation   Traeson Dusza D. Laney Potash, PharmD, BCPS, BCCCP 12/31/2022, 7:13 AM

## 2022-12-31 NOTE — Progress Notes (Signed)
NAME:  FREDIRICK BUTTON, MRN:  [id number], DOB:  1955-09-11, LOS: 11 ADMISSION DATE:  12/20/2022, CONSULTATION DATE: 9/12 REFERRING MD: Dr. Jacqulyn Bath, CHIEF COMPLAINT: Mesenteric ischemia  History of Present Illness:   67 year old male with past medical history as below, which is significant for intestinal stromal tumor removal in 2022, known mesenteric stenosis, GERD, hypertension, and hyperlipidemia.  In the few weeks prior to presentation on 9/11 he had been evaluated at several campuses for abdominal pain with noncontrasted CTs of the abdomen which were unrevealing.  He continued to have abdominal pain, weight loss, and poor p.o. intake.  He presented [name] on 9/11 and was admitted to the hospitalist for abdominal pain with concern for superior mesenteric artery thrombus.  He was started on heparin infusion.  Vascular surgery was consulted and ultimately a CT angiogram of the abdomen and pelvis was done 9/12 demonstrating acute occlusion of the distal SMA as well as occlusion of the inferior mesenteric artery.  He was taken to the operating room emergently and and was found to have an necrotic area of the jejunum.  This was resected, and SMA stent was placed, and the patient was left with an open abdomen.  He also remained on the mechanical ventilator postoperatively and PCCM was asked to admit for ventilator management.  Pertinent  Medical History   has a past medical history of Ankle fracture, Cancer (HCC) (1974), Cataract, GERD (gastroesophageal reflux disease), colonic polyps, Hyperlipidemia, Hypertension, Kidney stones, Patella fracture, Retinal detachment, Retinal tear of right eye, and Syncopal episodes (2008).   Significant Hospital Events: Including procedures, antibiotic start and stop dates in addition to other pertinent events   9/11 admitted with abdominal pain 9/12 CT demonstrating mesenteric occlusion, bowel resection and stent placed, to ICU on vent. Left in discontinuity  9/14  back to OR. Left in discontinuity as still concern about bowel integrity 9/16 back to OR  s sig areas of ischemia w/ 2 areas of perforation, another 66cm section of jejunum was resected and area of discontinuity was reanastomosed. Changed to argatroban post-op as unable to get heparin levels detectable  9/17 dropping RASS goal to 0 to -1, assessing for readiness to wean, serum creatinine and BUN still climbing some but making urine 9/18 iHD  9/20 rising pressor need, anemia: CTA abd: nothing emergent, transfused  Interim History / Subjective:  Weaning well, following commands.  Objective   Blood pressure (!) 146/66, pulse 73, temperature 98.2 F (36.8 C), resp. rate 11, height 5\' 11"  (1.803 m), weight 101.2 kg, SpO2 97%. CVP:  [2 mmHg-10 mmHg] 5 mmHg  Vent Mode: PSV;CPAP FiO2 (%):  [40 %] 40 % Set Rate:  [15 bmp] 15 bmp Vt Set:  [600 mL] 600 mL PEEP:  [5 cmH20] 5 cmH20 Pressure Support:  [3 cmH20] 3 cmH20 Plateau Pressure:  [17 cmH20] 17 cmH20   Intake/Output Summary (Last 24 hours) at 12/31/2022 0949 Last data filed at 12/31/2022 0900 Gross per 24 hour  Intake 3893.67 ml  Output 3614.8 ml  Net 278.87 ml   Filed Weights   12/29/22 0500 12/30/22 0400 12/31/22 0400  Weight: 100.8 kg 101 kg 101.2 kg    Examination: No distress Lungs clear Moves to command but weak Abd hypoactive BS, incision site dressed without strikethrough Ext no edema Bilious output NGT  BMP ok on CRRT WBC improved H/H down a point   Resolved Hospital Problem list     Assessment & Plan:   Acute mesenteric ischemia: Status post small  bowel resection and superior mesenteric artery stent placement 9/12.  Open abdomen, left in discontinuity since 9/12, another 66 cm of jejunum was resected on 9/16 prior to successful reanastomosis of the small bowel Postoperative ventilator management- will d/w CCS, should work toward extubation today Acute encephalopathy: likely uremia and sedation related; CT head  neg 9/20 AKI on CKD3: in context of mesenteric ischemia, fluid shifts and likely ATN.  On CRRT Postop anemia- transfused 9/20; still dropping, need to keep an eye on; CT abd benign 9/20 Protein calorie malnutrition : significant weight loss prior to this admission, now NPO Sacral decub/deep tissue wound. Still not at place where can be staged-WOC following Hx HTN, cataracts, GERD, HLD Shock state 9/20: resolving  - SAT/SBT, consider extubation - Wean pressors - CRRT even goal - Restarted on vanc/meropenem 9/20; will do until 9/25 for ?translocation - Argatroban for AC, check afternoon CBC - Vent bundle - TPN - Appreciate ongoing VVS and CCS input - Wife updated at bedside  Best Practice (right click and "Reselect all SmartList Selections" daily)   Diet/type: TPN DVT prophylaxis: argatroban GI prophylaxis: PPI Lines: Central line and Dialysis Catheter Foley:  Yes, and it is still needed Code Status:  full code Last date of multidisciplinary goals of care discussion [updated wife at bedside 9/22]  33 min cc time Myrla Halsted MD [facility] 12/31/2022, 9:49 AM  Please see [email or web address] for pager details.  From 7A-7P if no response, please call [phone]. After hours, please call ELink [phone].

## 2022-12-31 NOTE — Procedures (Signed)
Extubation Procedure Note  Patient Details:   Name: [name] [name] DOB: 1956/02/13 MRN: [id number]   Airway Documentation:    Vent end date: 12/31/22 Vent end time: 0751   Evaluation  O2 sats: stable throughout Complications: No apparent complications Patient did tolerate procedure well. Bilateral Breath Sounds: Clear, Diminished   Yes  Pt extubated to 2L [place], pt tolerated well. Cuff leak present, No stridor noted,, RN at bedside, CCM aware, RT will monitor as needed.   Thornell Mule 12/31/2022, 8:18 AM

## 2022-12-31 NOTE — Progress Notes (Addendum)
[facility] NEPHROLOGY PROGRESS NOTE  Assessment/ Plan: Pt is a 67 y.o. 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   # Acute kidney injury on CKD IIIb: Baseline creatinine level around 2.0.  Acute kidney injury likely ischemic ATN due to ex lap surgery/IV contrast/hypotension -Agree with not intervening on right RAS given that his right kidney is already much smaller than anticipated, discussed with VVS at that time. Likely not much function being contributed from right. -Given poor mental status despite being off sedation and worsening kidney function, started HD on 9/18 after discussing with patient's wife. S/p IHD #2 9/19. Transitioned to CRRT 9/20 given worsening hemodynamics and given that he received contrast (which was absolutely necessary) -will c/w CRRT today especially he is getting extubated. I am anticipating that he will be able to come off CRRT by Monday. Will assess for IHD need thereafter -Strict ins and out and close lab monitoring.   # Acute mesenteric ischemia: Status post small bowel resection and superior mesenteric artery stent placement.  Followed by vascular surgeon and currently on agatroban since there was inadequate response to heparin. S/p OR again 9/16 for exlap and further small bowel resection, abd closed. S/p CTA 9/20 without anything emergent   # Vent dependent respiratory failure: Per PCCM. RRT for clearance. Working towards extubation today   # Anemia likely due to blood loss: transfuse prn for hgb <7   # Shock -pressor support per primary service  -abx per primary service  Discussed with ICU RN.  Addendum: discussed with rn and primary. Concern for clotting filter, will add CRRT heparin   Anthony Sar, MD [facility]  Subjective: Seen and examined on CRRT. No acute events. About to get extubated. Tolerating CRRT. Pressors: vaso Net pos ~0.1L Uop ~0.3L  Objective Vital signs in last 24 hours: Vitals:   12/31/22 0500 12/31/22 0600 12/31/22 0700 12/31/22 0800  BP: (!) 92/54 120/62 (!) 104/52 (!) 146/66  Pulse: 66 62 62 73  Resp: 16 13 14 11   Temp: 98.4 F (36.9 C) 98.4 F (36.9 C) 98.2 F (36.8 C) 98.2 F (36.8 C)  TempSrc:      SpO2: 98% 98% 99% 97%  Weight:      Height:       Weight change: 0.2 kg  Intake/Output Summary (Last 24 hours) at 12/31/2022 0908 Last data filed at 12/31/2022 0800 Gross per 24 hour  Intake 3773.39 ml  Output 3382.5 ml  Net 390.89 ml       Labs: RENAL PANEL Recent Labs  Lab 12/26/22 0429 12/27/22 0455 12/27/22 1242 12/28/22 0419 12/28/22 1507 12/29/22 0502 12/29/22 0806 12/29/22 1558 12/30/22 0458 12/30/22 1600 12/31/22 0411  NA 135 139   < > 137   < > 136 136 136 134* 137 134*  K 4.1 3.5   < > 3.0*   < > 3.6 3.7 4.2 4.5 4.4 4.1  CL 102 102   < > 101   < > 96*  --  96* 96* 101 99  CO2 21* 21*   < > 25   < > 24  --  26 23 24 23   GLUCOSE 153* 157*   < > 162*   < > 247*  --  215* 227* 206* 236*  BUN 86*  110*   < > 104*   < > 84*  --  85* 78* 76* 75*  CREATININE 7.10* 7.05*   < > 5.77*   < > 4.17*  --  3.57* 2.91* 2.62* 2.36*  CALCIUM 8.0* 8.3*   < > 8.0*   < > 8.0*  --  7.9* 7.9* 8.0* 7.7*  MG 2.0 2.1  --  2.0  --   --   --   --  2.4  --  2.5*  PHOS 6.0* 4.4   < > 2.9  --  3.3  --  2.9 2.8 2.6 2.8  ALBUMIN  --   --    < > 1.6*  --  1.8*  --  1.7* 1.8* 1.8* 1.7*   < > = values in this interval not displayed.    Liver Function Tests: Recent Labs  Lab 12/24/22 1937 12/25/22 0345 12/27/22 1242 12/28/22 0419 12/29/22 0502 12/30/22 0458 12/30/22 1600 12/31/22 0411  AST 72* 79*  --  46*  --   --   --   --   ALT 29 32  --  22  --   --   --   --   ALKPHOS 38 45  --  75  --   --   --   --   BILITOT 0.6  0.7  --  1.0  --   --   --   --   PROT 4.6* 5.0*  --  5.1*  --   --   --   --   ALBUMIN 1.8* 1.8*   < > 1.6*   < > 1.8* 1.8* 1.7*   < > = values in this interval not displayed.   No results for input(s): "LIPASE", "AMYLASE" in the last 168 hours. Recent Labs  Lab 12/27/22 1027  AMMONIA 12   CBC: Recent Labs    12/29/22 1558 12/29/22 2333 12/30/22 0458 12/30/22 1600 12/31/22 0411  HGB 9.3* 7.5* 9.0* 8.9* 7.9*  MCV 80.3 82.3 82.5 83.8 84.5    Cardiac Enzymes: No results for input(s): "CKTOTAL", "CKMB", "CKMBINDEX", "TROPONINI" in the last 168 hours. CBG: Recent Labs  Lab 12/30/22 1606 12/30/22 2026 12/30/22 2312 12/31/22 0421 12/31/22 0800  GLUCAP 172* 200* 176* 183* 183*    Iron Studies: No results for input(s): "IRON", "TIBC", "TRANSFERRIN", "FERRITIN" in the last 72 hours. Studies/Results: CT ANGIO GI BLEED  Result Date: 12/29/2022 CLINICAL DATA:  Pneumonia.  Mesenteric ischemia.  GI bleed EXAM: CTA ABDOMEN AND PELVIS WITHOUT AND WITH CONTRAST CT chest without contrast TECHNIQUE: Multidetector CT imaging of the abdomen and pelvis was performed using the standard protocol during bolus administration of intravenous contrast. Multiplanar reconstructed images and MIPs were obtained and reviewed to evaluate the vascular anatomy. Precontrast CT images were obtained of the chest as well with sagittal and coronal reformatted datasets. RADIATION DOSE REDUCTION: This exam was performed according to the departmental dose-optimization program which includes automated exposure control, adjustment of the mA and/or kV according to patient size and/or use of iterative reconstruction technique. CONTRAST:  75mL OMNIPAQUE IOHEXOL 350 MG/ML SOLN COMPARISON:  Chest x-ray 12/29/2022 earlier. CT angiogram 12/21/2022 and older FINDINGS: Chest: Cardiovascular: Heart is nonenlarged. No significant pericardial effusion. The thoracic aorta has a normal course and caliber with scattered vascular  calcifications. Significant coronary artery calcifications. Bilateral IJ catheters in place with tip along the central SVC. Of note there is some air in the left side of the neck anterior to the left IJ catheter.  Mediastinum: Enteric tube in place with tip extending into the stomach. Small thyroid gland. No abnormal lymph node enlargement seen in the axillary regions, hilum or mediastinum on this noncontrast examination. Lungs and pleura: Breathing motion seen throughout the examination. There is some linear opacity seen along the lung bases likely scar or atelectasis. Few mild areas of ground-glass, nonspecific. Please correlate for any evidence of air trapping. There is a 4 mm nodule in the lingula on series 8, image 77 which is noncalcified. At most trace pleural fluid. No pneumothorax. Musculoskeletal: Moderate degenerative changes of the spine with bridging osteophytes. No fracture or dislocation. Abdomen pelvis: VASCULAR Aorta: Extensive vascular calcifications along the abdominal aorta. Small components of noncalcified plaque or thrombus as well. No dissection or aneurysm formation. Celiac: Mild calcified plaque.  Standard branching pattern SMA: Stent along the origin of the SMA. There is significant calcified plaque along the course of the SMA. The stent is grossly patent. The mid to distal branches of the SMA are extensively calcified limiting evaluation for subtle areas of stenosis or vessel occlusion. Previously there was an area of presumed vessel distal branch occlusion along the secondary branch of the SMA. This portion of the vessel on coronal series 10, image 105 appears to be more enhancing. What are seen of the tertiary branches appear to be preserved but several are atretic. Caliber of the vessels is smaller than the prior examination. Please correlate for areas of spasm. Renals: Renal arteries are atretic. Significant calcified plaque at their origins. Possible high-grade stenosis. Please  correlate for level of renal dysfunction and hypertension. IMA: Proximally occluded. Inflow: Prominent calcified plaque along the common iliacs with some noncalcified plaque and thrombus along the right common iliac. There is also significant disease along the internal iliacs. The right external iliac is occluded at its origin with reconstitution distally just proximal to the common femoral. Left external iliac has a minimal disease. Proximal Outflow: Moderate plaque along the right common femoral with some mild stenosis. Veins: No obvious venous abnormality within the limitations of this arterial phase study. Review of the MIP images confirms the above findings. NON-VASCULAR Hepatobiliary: No space-occupying liver lesion. Patent portal vein. Previous cholecystectomy. Pancreas: Unremarkable. No pancreatic ductal dilatation or surrounding inflammatory changes. Spleen: Normal in size without focal abnormality. Adrenals/Urinary Tract: Adrenal glands are preserved. There is moderate atrophy of the right kidney. Punctate lower pole stone. Bilateral Bosniak 1 and 2 renal cysts. No imaging follow-up. Foley catheter in the contracted urinary bladder. Stomach/Bowel: Enteric tube in the stomach. The stomach and duodenum are nondilated. There are dilated loops of jejunum in the left midabdomen with surgical changes. Areas of wall thickening. Please correlate with interval bowel resection. Large bowel is of normal course and caliber. There are some areas of large bowel which are collapsed with mild wall thickening. There is significant stool in the rectum. No definite areas of active extravasation along the bowel. There are some luminal high density along loops of colon anterior left but these are high density on precontrast and likely luminal debris. Similar areas along loops of small bowel in the right midabdomen. On portal venous phase imaging overall bowel walls are nonenhancing as would normally expect. Lymphatic: No  abnormal lymph node enlargement identified in the abdomen and pelvis. Reproductive: Prostate is unremarkable. Other: Anasarca. Scattered mild free fluid. Mesenteric stranding. Few bubbles of free air but patient is postop since December 21, 2022 Musculoskeletal: Moderate degenerative changes seen of the spine and pelvis. Transitional lumbosacral segment. IMPRESSION:  VASCULAR Interval placement of the stent along the SMA with improved ostial stenosis. In addition the area of thrombus or vascular occlusion along the secondary branch of the SMA, ileocolic artery, appears to be improved today with more enhancement and flow in this location. Significant calcified plaque elsewhere throughout the abdomen and pelvis. Once again there is significant calcified plaque along the origin of the renal arteries. Please correlate for patient's level of hypertension and renal function. Occlusion of the right external iliac artery with distal reconstitution. No areas of active extravasation of contrast at this time. There is some high density luminal material in the colon which was present on the precontrast dataset. NON-VASCULAR Interval postsurgical changes with some bowel resection along the sigmoid colon left midabdomen. Loops of bowel in this location demonstrate increased dilatation, ill-defined margins and wall thickening. In addition on the delayed dataset the level of wall enhancement is less than usually seen nor expected. No pneumatosis or portal venous gas. Of note the vasculature throughout the abdomen pelvis appears more atretic. Please correlate for any history including evidence of vaso spasm. Scattered free air or free fluid which could be postsurgical. Mild bilateral basilar lung atelectasis and ground-glass. No consolidation. At most trace pleural fluid. 4 mm left-sided lung nodule. If patient is low risk for malignancy, no routine follow-up imaging is recommended. If patient is high risk for malignancy, a  non-contrast chest CT at 12 months is optional.This recommendation follows the consensus statement: Guidelines for Management of Incidental Pulmonary Nodules Detected on CT Images: From the Fleischner Society 2017; Radiology 2017; [phone]. Critical Value/emergent results were called by telephone at the time of interpretation on 12/29/2022 at 8:40 am to provider [facility] , who verbally acknowledged these results. Electronically Signed   By: Karen Kays M.D.   On: 12/29/2022 11:49   CT CHEST WO CONTRAST  Result Date: 12/29/2022 CLINICAL DATA:  Pneumonia.  Mesenteric ischemia.  GI bleed EXAM: CTA ABDOMEN AND PELVIS WITHOUT AND WITH CONTRAST CT chest without contrast TECHNIQUE: Multidetector CT imaging of the abdomen and pelvis was performed using the standard protocol during bolus administration of intravenous contrast. Multiplanar reconstructed images and MIPs were obtained and reviewed to evaluate the vascular anatomy. Precontrast CT images were obtained of the chest as well with sagittal and coronal reformatted datasets. RADIATION DOSE REDUCTION: This exam was performed according to the departmental dose-optimization program which includes automated exposure control, adjustment of the mA and/or kV according to patient size and/or use of iterative reconstruction technique. CONTRAST:  75mL OMNIPAQUE IOHEXOL 350 MG/ML SOLN COMPARISON:  Chest x-ray 12/29/2022 earlier. CT angiogram 12/21/2022 and older FINDINGS: Chest: Cardiovascular: Heart is nonenlarged. No significant pericardial effusion. The thoracic aorta has a normal course and caliber with scattered vascular calcifications. Significant coronary artery calcifications. Bilateral IJ catheters in place with tip along the central SVC. Of note there is some air in the left side of the neck anterior to the left IJ catheter. Mediastinum: Enteric tube in place with tip extending into the stomach. Small thyroid gland. No abnormal lymph node enlargement seen in  the axillary regions, hilum or mediastinum on this noncontrast examination. Lungs and pleura: Breathing motion seen throughout the examination. There is some linear opacity seen along the lung bases likely scar or atelectasis. Few mild areas of ground-glass, nonspecific. Please correlate for any evidence of air trapping. There is a 4 mm nodule in the lingula on series 8, image 77 which is noncalcified. At most trace pleural fluid. No pneumothorax. Musculoskeletal: Moderate  degenerative changes of the spine with bridging osteophytes. No fracture or dislocation. Abdomen pelvis: VASCULAR Aorta: Extensive vascular calcifications along the abdominal aorta. Small components of noncalcified plaque or thrombus as well. No dissection or aneurysm formation. Celiac: Mild calcified plaque.  Standard branching pattern SMA: Stent along the origin of the SMA. There is significant calcified plaque along the course of the SMA. The stent is grossly patent. The mid to distal branches of the SMA are extensively calcified limiting evaluation for subtle areas of stenosis or vessel occlusion. Previously there was an area of presumed vessel distal branch occlusion along the secondary branch of the SMA. This portion of the vessel on coronal series 10, image 105 appears to be more enhancing. What are seen of the tertiary branches appear to be preserved but several are atretic. Caliber of the vessels is smaller than the prior examination. Please correlate for areas of spasm. Renals: Renal arteries are atretic. Significant calcified plaque at their origins. Possible high-grade stenosis. Please correlate for level of renal dysfunction and hypertension. IMA: Proximally occluded. Inflow: Prominent calcified plaque along the common iliacs with some noncalcified plaque and thrombus along the right common iliac. There is also significant disease along the internal iliacs. The right external iliac is occluded at its origin with reconstitution  distally just proximal to the common femoral. Left external iliac has a minimal disease. Proximal Outflow: Moderate plaque along the right common femoral with some mild stenosis. Veins: No obvious venous abnormality within the limitations of this arterial phase study. Review of the MIP images confirms the above findings. NON-VASCULAR Hepatobiliary: No space-occupying liver lesion. Patent portal vein. Previous cholecystectomy. Pancreas: Unremarkable. No pancreatic ductal dilatation or surrounding inflammatory changes. Spleen: Normal in size without focal abnormality. Adrenals/Urinary Tract: Adrenal glands are preserved. There is moderate atrophy of the right kidney. Punctate lower pole stone. Bilateral Bosniak 1 and 2 renal cysts. No imaging follow-up. Foley catheter in the contracted urinary bladder. Stomach/Bowel: Enteric tube in the stomach. The stomach and duodenum are nondilated. There are dilated loops of jejunum in the left midabdomen with surgical changes. Areas of wall thickening. Please correlate with interval bowel resection. Large bowel is of normal course and caliber. There are some areas of large bowel which are collapsed with mild wall thickening. There is significant stool in the rectum. No definite areas of active extravasation along the bowel. There are some luminal high density along loops of colon anterior left but these are high density on precontrast and likely luminal debris. Similar areas along loops of small bowel in the right midabdomen. On portal venous phase imaging overall bowel walls are nonenhancing as would normally expect. Lymphatic: No abnormal lymph node enlargement identified in the abdomen and pelvis. Reproductive: Prostate is unremarkable. Other: Anasarca. Scattered mild free fluid. Mesenteric stranding. Few bubbles of free air but patient is postop since December 21, 2022 Musculoskeletal: Moderate degenerative changes seen of the spine and pelvis. Transitional lumbosacral  segment. IMPRESSION: VASCULAR Interval placement of the stent along the SMA with improved ostial stenosis. In addition the area of thrombus or vascular occlusion along the secondary branch of the SMA, ileocolic artery, appears to be improved today with more enhancement and flow in this location. Significant calcified plaque elsewhere throughout the abdomen and pelvis. Once again there is significant calcified plaque along the origin of the renal arteries. Please correlate for patient's level of hypertension and renal function. Occlusion of the right external iliac artery with distal reconstitution. No areas of active extravasation of contrast  at this time. There is some high density luminal material in the colon which was present on the precontrast dataset. NON-VASCULAR Interval postsurgical changes with some bowel resection along the sigmoid colon left midabdomen. Loops of bowel in this location demonstrate increased dilatation, ill-defined margins and wall thickening. In addition on the delayed dataset the level of wall enhancement is less than usually seen nor expected. No pneumatosis or portal venous gas. Of note the vasculature throughout the abdomen pelvis appears more atretic. Please correlate for any history including evidence of vaso spasm. Scattered free air or free fluid which could be postsurgical. Mild bilateral basilar lung atelectasis and ground-glass. No consolidation. At most trace pleural fluid. 4 mm left-sided lung nodule. If patient is low risk for malignancy, no routine follow-up imaging is recommended. If patient is high risk for malignancy, a non-contrast chest CT at 12 months is optional.This recommendation follows the consensus statement: Guidelines for Management of Incidental Pulmonary Nodules Detected on CT Images: From the Fleischner Society 2017; Radiology 2017; [phone]. Critical Value/emergent results were called by telephone at the time of interpretation on 12/29/2022 at 8:40 am to  provider [facility] , who verbally acknowledged these results. Electronically Signed   By: Karen Kays M.D.   On: 12/29/2022 11:49   CT HEAD WO CONTRAST ( )  Result Date: 12/29/2022 CLINICAL DATA:  67 year old male with altered mental status. EXAM: CT HEAD WITHOUT CONTRAST TECHNIQUE: Contiguous axial images were obtained from the base of the skull through the vertex without intravenous contrast. RADIATION DOSE REDUCTION: This exam was performed according to the departmental dose-optimization program which includes automated exposure control, adjustment of the mA and/or kV according to patient size and/or use of iterative reconstruction technique. COMPARISON:  None Available. FINDINGS: Brain: Cerebral volume is within normal limits for age. No midline shift, ventriculomegaly, mass effect, evidence of mass lesion, intracranial hemorrhage or evidence of cortically based acute infarction. Gray-white matter differentiation is within normal limits throughout the brain. Vascular: Calcified atherosclerosis at the skull base. No suspicious intracranial vascular hyperdensity. Skull: No acute osseous abnormality identified. Sinuses/Orbits: Right nasoenteric tube in place. Subtotal opacified right maxillary sinus with mild bubbly opacity. Bubbly opacity in the left sphenoid. Other paranasal sinuses are well aerated. Mild left mastoid effusion. Tympanic cavities remain clear. Other: No acute orbit or scalp soft tissue finding. IMPRESSION: 1. Normal for age noncontrast CT appearance of the brain. 2. Right nasoenteric tube in place. Mild paranasal sinus inflammation, left mastoid effusion. Electronically Signed   By: Odessa Fleming M.D.   On: 12/29/2022 09:48    Medications: Infusions:   prismasol BGK 4/2.5 500 mL/hr at 12/31/22 0432    prismasol BGK 4/2.5 400 mL/hr at 12/30/22 2140   albumin human Stopped (12/29/22 1757)   anticoagulant sodium citrate     argatroban 0.12 mcg/kg/min (12/31/22 0800)   fentaNYL infusion  INTRAVENOUS 50 mcg/hr (12/31/22 0800)   meropenem (MERREM) IV Stopped (12/31/22 0551)   norepinephrine (LEVOPHED) Adult infusion Stopped (12/31/22 0746)   prismasol BGK 4/2.5 1,500 mL/hr at 12/31/22 0801   TPN ADULT (ION) 105 mL/hr at 12/31/22 0800   TPN ADULT (ION)     vancomycin Stopped (12/30/22 1948)   vasopressin 0.04 Units/min (12/31/22 0800)    Scheduled Medications:  sodium chloride   Intravenous Once   aspirin  300 mg Rectal Daily   Chlorhexidine Gluconate Cloth  6 each Topical Daily   hydrocortisone sod succinate (SOLU-CORTEF) inj  100 mg Intravenous Q8H   insulin aspart  0-20 Units  Subcutaneous Q4H   mouth rinse  15 mL Mouth Rinse Q2H   pantoprazole (PROTONIX) IV  40 mg Intravenous Q12H   sodium chloride flush  3 mL Intravenous Q12H    have reviewed scheduled and prn medications.  Physical Exam: General: ill appearing, Intubated Heart:RRR, s1s2 nl Lungs: cta bilateral. intubated Abdomen: dressings in place, distended Extremities:No sig edema Neurology: unresponsive Dialysis access: RIJ temp HD catheter  Everrett Lacasse 12/31/2022,9:08 AM  LOS: 11 days

## 2023-01-01 DIAGNOSIS — K551 Chronic vascular disorders of intestine: Secondary | ICD-10-CM | POA: Diagnosis not present

## 2023-01-01 LAB — CBC
HCT: 28.7 % — ABNORMAL LOW (ref 39.0–52.0)
HCT: 24.9 % — ABNORMAL LOW (ref 39.0–52.0)
Hemoglobin: 9.2 g/dL — ABNORMAL LOW (ref 13.0–17.0)
Hemoglobin: 8 g/dL — ABNORMAL LOW (ref 13.0–17.0)
MCH: 27.1 pg (ref 26.0–34.0)
MCH: 26.8 pg (ref 26.0–34.0)
MCHC: 32.1 g/dL (ref 30.0–36.0)
MCHC: 32.1 g/dL (ref 30.0–36.0)
MCV: 84.7 fL (ref 80.0–100.0)
MCV: 83.6 fL (ref 80.0–100.0)
Platelets: 228 10*3/uL (ref 150–400)
Platelets: 158 10*3/uL (ref 150–400)
RBC: 3.39 MIL/uL — ABNORMAL LOW (ref 4.22–5.81)
RBC: 2.98 MIL/uL — ABNORMAL LOW (ref 4.22–5.81)
RDW: 16.3 % — ABNORMAL HIGH (ref 11.5–15.5)
RDW: 16.5 % — ABNORMAL HIGH (ref 11.5–15.5)
WBC: 21.1 10*3/uL — ABNORMAL HIGH (ref 4.0–10.5)
WBC: 23.4 10*3/uL — ABNORMAL HIGH (ref 4.0–10.5)
nRBC: 0 % (ref 0.0–0.2)
nRBC: 0 % (ref 0.0–0.2)

## 2023-01-01 LAB — COMPREHENSIVE METABOLIC PANEL
ALT: 209 U/L — ABNORMAL HIGH (ref 0–44)
AST: 221 U/L — ABNORMAL HIGH (ref 15–41)
Albumin: 1.6 g/dL — ABNORMAL LOW (ref 3.5–5.0)
Alkaline Phosphatase: 121 U/L (ref 38–126)
Anion gap: 9 (ref 5–15)
BUN: 75 mg/dL — ABNORMAL HIGH (ref 8–23)
CO2: 24 mmol/L (ref 22–32)
Calcium: 7.2 mg/dL — ABNORMAL LOW (ref 8.9–10.3)
Chloride: 104 mmol/L (ref 98–111)
Creatinine, Ser: 2.07 mg/dL — ABNORMAL HIGH (ref 0.61–1.24)
GFR, Estimated: 34 mL/min — ABNORMAL LOW (ref >60)
Glucose, Bld: 230 mg/dL — ABNORMAL HIGH (ref 70–99)
Potassium: 3.7 mmol/L (ref 3.5–5.1)
Sodium: 137 mmol/L (ref 135–145)
Total Bilirubin: 1.4 mg/dL — ABNORMAL HIGH (ref 0.3–1.2)
Total Protein: 4.6 g/dL — ABNORMAL LOW (ref 6.5–8.1)

## 2023-01-01 LAB — POCT ACTIVATED CLOTTING TIME
Activated Clotting Time: 201 seconds
Activated Clotting Time: 213 seconds
Activated Clotting Time: 214 seconds
Activated Clotting Time: 214 seconds
Activated Clotting Time: 214 seconds
Activated Clotting Time: 220 seconds
Activated Clotting Time: 202 seconds
Activated Clotting Time: 214 seconds
Activated Clotting Time: 195 seconds
Activated Clotting Time: 201 seconds

## 2023-01-01 LAB — RENAL FUNCTION PANEL
Albumin: 1.7 g/dL — ABNORMAL LOW (ref 3.5–5.0)
Anion gap: 11 (ref 5–15)
BUN: 69 mg/dL — ABNORMAL HIGH (ref 8–23)
CO2: 23 mmol/L (ref 22–32)
Calcium: 7 mg/dL — ABNORMAL LOW (ref 8.9–10.3)
Chloride: 105 mmol/L (ref 98–111)
Creatinine, Ser: 2.19 mg/dL — ABNORMAL HIGH (ref 0.61–1.24)
GFR, Estimated: 32 mL/min — ABNORMAL LOW (ref >60)
Glucose, Bld: 174 mg/dL — ABNORMAL HIGH (ref 70–99)
Phosphorus: 5.5 mg/dL — ABNORMAL HIGH (ref 2.5–4.6)
Potassium: 3.7 mmol/L (ref 3.5–5.1)
Sodium: 139 mmol/L (ref 135–145)

## 2023-01-01 LAB — PHOSPHORUS: Phosphorus: 2.1 mg/dL — ABNORMAL LOW (ref 2.5–4.6)

## 2023-01-01 LAB — GLUCOSE, CAPILLARY
Glucose-Capillary: 125 mg/dL — ABNORMAL HIGH (ref 70–99)
Glucose-Capillary: 147 mg/dL — ABNORMAL HIGH (ref 70–99)
Glucose-Capillary: 142 mg/dL — ABNORMAL HIGH (ref 70–99)

## 2023-01-01 LAB — APTT: aPTT: 46 seconds — ABNORMAL HIGH (ref 24–36)

## 2023-01-01 LAB — MAGNESIUM: Magnesium: 2.4 mg/dL (ref 1.7–2.4)

## 2023-01-01 MED ORDER — ORAL CARE MOUTH RINSE: 15.0000 mL | OROMUCOSAL | Status: DC | PRN

## 2023-01-01 MED ORDER — ACETAMINOPHEN 10 MG/ML IV SOLN
1000.0000 mg | Freq: Four times a day (QID) | INTRAVENOUS | Status: AC | PRN
  Administered 2023-01-01 – 2023-01-02 (×2): 1000 mg via INTRAVENOUS
  Filled 2023-01-01 (×2): qty 100

## 2023-01-01 MED ORDER — ALBUMIN HUMAN 25 % IV SOLN
12.5000 g | Freq: Once | INTRAVENOUS | Status: AC
  Administered 2023-01-01: 12.5 g via INTRAVENOUS
  Filled 2023-01-01: qty 50

## 2023-01-01 MED ORDER — SODIUM PHOSPHATES 45 MMOLE/15ML IV SOLN
30.0000 mmol | Freq: Once | INTRAVENOUS | Status: AC
  Administered 2023-01-01: 30 mmol via INTRAVENOUS
  Filled 2023-01-01: qty 10

## 2023-01-01 MED ORDER — TRACE MINERALS CU-MN-SE-ZN 300-55-60-3000 MCG/ML IV SOLN
INTRAVENOUS | Status: AC
  Filled 2023-01-01: qty 985.6

## 2023-01-01 MED ORDER — STERILE WATER FOR INJECTION IJ SOLN
INTRAMUSCULAR | Status: AC
  Filled 2023-01-01: qty 10

## 2023-01-01 NOTE — Progress Notes (Signed)
PHARMACY - TOTAL PARENTERAL NUTRITION CONSULT NOTE  Indication:  SMA syndrome w associated weight loss  Patient Measurements: Height: 5\' 11"  (180.3 cm) Weight: 99 kg (218 lb 4.1 oz) IBW/kg (Calculated) : 75.3 TPN AdjBW (KG): 80.4 Body mass index is 30.44 kg/m. Usual Weight: 95 kg, has lost 50lbs recently   Assessment:  67 yo M admitted with abdominal pain, weight loss, and poor PO intake. 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 He is now s/p SMA stent placement. He was initially left is discontinuity with open abdomen and was closed on 9/16. TPN was initiated 9/14.   Glucose / Insulin: A1c 6.3% - CBGs 111-125 (BG 230) Used 21 units SSI in the past 24 hrs. Electrolytes: Na 137, K stable at 3.7, Phos up to 2.1 (receiving NaPhos ), Mag 2.4 (s/p 2gm on 9/20; reduced in TPN 9/21), others WNL Renal: iHD x2 days, CRRT started 9/20 - SCr down 2.07, BUN down to 75 Hepatic: LFTs up (AST/ALT 221/209), Tbili up 1.4, albumin 1.6, TG mildly elevated at 160 Intake / Output; MIVF: UOP 0.3 ml/kg/hr (last Lasix 9/14), NG down 10 mL (coffee ground), wound vac output not reported for last 24 hrs, net +5.3L, LBM 9/22.   GI Imaging:  9/20 CTA: improved ostial stenosis post stenting, thrombus/occlusion of SMA/ileocolic artery improving, surgical site has increased dilatation, L lung nodule GI Surgeries / Procedures:  9/14 re-ex lap, left in discontinuity with plans for re-exploration  9/16 re-ex lap, SBR with anastomosis x2, abdominal closure Wounds (aside from abd): Deep tissue pressure injury to B/L buttocks  Central access: CVC LIJ 12/23/22 TPN start date: 12/23/22  Nutritional Goals: Goal concentrated TPN rate is 80 mL/hr (provides 148 g AA and 2320 kCal per day)  RD Estimated Needs Total Energy Estimated Needs: 2300-2500 kcals Total Protein Estimated Needs: 140 -150 g Total Fluid Estimated Needs: >/= 2.3 L  Current  Nutrition:  TPN  Ice chips Off TF 9/20 with concern for bowel ischemia > NPO  Plan:  Now able to concentrate TPN:  Change to concentrated TPN at goal rate of 80 ml/hr to meet 100% of needs Electrolytes in TPN: Na to 181mEq/L, K to 7.8 mEq/L (~15 mEq/day), Ca 6.5 mEq/L, Mg 4 mEq/L, increase to Phos 7 mmol/L, Cl:Ac 1:1. *Slightly adjusted to keep electrolyte amounts similar.  Replacing NaPhos outside of bag.  Add standard MVI and trace elements to TPN No chromium due to RRT and on shortage Continue resistant SSI Q4H while on steroid.  Add long-acting insulin if needed. Monitor TPN labs on Mon/Thurs >> renal function panel BID per MD F/U with restarting trickle feed when able  Working toward extubation  Link Snuffer, PharmD, BCPS, BCCCP Please refer to [facility] for [facility] Pharmacy numbers 01/01/2023, 8:32 AM

## 2023-01-01 NOTE — Progress Notes (Signed)
Patient ID: [name] [name], male   DOB: [birth year], 67 y.o.   MRN: [id number] S:No events overnight.  Pt was seen and examined while on CRRT.  Labs and orders reviewed and adjustments made accordingly.  O:BP (!) 87/57 (BP Location: Left Arm)   Pulse (!) 102   Temp 100.2 F (37.9 C) (Rectal)   Resp 18   Ht 5\' 11"  (1.803 m)   Wt 99 kg   SpO2 100%   BMI 30.44 kg/m   Intake/Output Summary (Last 24 hours) at 01/01/2023 0833 Last data filed at 01/01/2023 0800 Gross per 24 hour  Intake 3074.35 ml  Output 4646.3 ml  Net -1571.95 ml   Intake/Output: I/O last 3 completed shifts: In: 5279.3 [I.V.:4418; IV Piggyback:861.3] Out: 6634.1 [Urine:806; Emesis/NG output:40]  Intake/Output this shift:  Total I/O In: 115.9 [I.V.:105.9; Other:10] Out: 161 [Urine:45] Weight change: -2.2 kg KGM:WNUUVOZD, NAD CVS: tachy @102  Resp:poor inspiratory effort, otherwise CTA Abd: hypoactive bowel sounds Ext: no edema  Recent Labs  Lab 12/28/22 0419 12/28/22 1507 12/29/22 0502 12/29/22 0806 12/29/22 1558 12/30/22 0458 12/30/22 1600 12/31/22 0411 12/31/22 1508 12/31/22 2102 01/01/23 0351  NA 137   < > 136   < > 136 134* 137 134* 137 138 137  K 3.0*   < > 3.6   < > 4.2 4.5 4.4 4.1 3.7 3.4* 3.7  CL 101   < > 96*  --  96* 96* 101 99 102  --  104  CO2 25   < > 24  --  26 23 24 23 25   --  24  GLUCOSE 162*   < > 247*  --  215* 227* 206* 236* 128*  --  230*  BUN 104*   < > 84*  --  85* 78* 76* 75* 74*  --  75*  CREATININE 5.77*   < > 4.17*  --  3.57* 2.91* 2.62* 2.36* 2.08*  --  2.07*  ALBUMIN 1.6*  --  1.8*  --  1.7* 1.8* 1.8* 1.7* 1.7*  --  1.6*  CALCIUM 8.0*   < > 8.0*  --  7.9* 7.9* 8.0* 7.7* 7.6*  --  7.2*  PHOS 2.9  --  3.3  --  2.9 2.8 2.6 2.8 1.7*  --  2.1*  AST 46*  --   --   --   --   --   --   --   --   --  221*  ALT 22  --   --   --   --   --   --   --   --   --  209*   < > = values in this interval not displayed.   Liver Function Tests: Recent Labs  Lab 12/28/22 0419 12/29/22 0502  12/31/22 0411 12/31/22 1508 01/01/23 0351  AST 46*  --   --   --  221*  ALT 22  --   --   --  209*  ALKPHOS 75  --   --   --  121  BILITOT 1.0  --   --   --  1.4*  PROT 5.1*  --   --   --  4.6*  ALBUMIN 1.6*   < > 1.7* 1.7* 1.6*   < > = values in this interval not displayed.   No results for input(s): "LIPASE", "AMYLASE" in the last 168 hours. Recent Labs  Lab 12/27/22 1027  AMMONIA 12   CBC: Recent Labs  Lab 12/30/22  8413 12/30/22 1600 12/31/22 0411 12/31/22 1508 12/31/22 2102 01/01/23 0351  WBC 33.0* 30.1* 26.9* 27.0*  --  21.1*  HGB 9.0* 8.9* 7.9* 7.7* 7.8* 9.2*  HCT 26.8* 27.5* 24.6* 23.8* 23.0* 28.7*  MCV 82.5 83.8 84.5 82.1  --  84.7  PLT 188 185 175 196  --  228   Cardiac Enzymes: No results for input(s): "CKTOTAL", "CKMB", "CKMBINDEX", "TROPONINI" in the last 168 hours. CBG: Recent Labs  Lab 12/31/22 0421 12/31/22 0800 12/31/22 1132 12/31/22 1611 01/01/23 0745  GLUCAP 183* 183* 133* 111* 125*    Iron Studies: No results for input(s): "IRON", "TIBC", "TRANSFERRIN", "FERRITIN" in the last 72 hours. Studies/Results: No results found.  sodium chloride   Intravenous Once   aspirin  300 mg Rectal Daily   Chlorhexidine Gluconate Cloth  6 each Topical Daily   insulin aspart  0-20 Units Subcutaneous Q4H   pantoprazole (PROTONIX) IV  40 mg Intravenous Q12H   sodium chloride flush  3 mL Intravenous Q12H    BMET    Component Value Date/Time   NA 137 01/01/2023 0351   K 3.7 01/01/2023 0351   CL 104 01/01/2023 0351   CO2 24 01/01/2023 0351   GLUCOSE 230 (H) 01/01/2023 0351   BUN 75 (H) 01/01/2023 0351   CREATININE 2.07 (H) 01/01/2023 0351   CALCIUM 7.2 (L) 01/01/2023 0351   GFRNONAA 34 (L) 01/01/2023 0351   GFRAA >60 04/14/2015 1127   CBC    Component Value Date/Time   WBC 21.1 (H) 01/01/2023 0351   RBC 3.39 (L) 01/01/2023 0351   HGB 9.2 (L) 01/01/2023 0351   HCT 28.7 (L) 01/01/2023 0351   PLT 228 01/01/2023 0351   MCV 84.7 01/01/2023 0351    MCH 27.1 01/01/2023 0351   MCHC 32.1 01/01/2023 0351   RDW 16.3 (H) 01/01/2023 0351   LYMPHSABS 0.9 10/30/2022 1654   MONOABS 0.6 10/30/2022 1654   EOSABS 0.3 10/30/2022 1654   BASOSABS 0.0 10/30/2022 1654     Assessment/Plan:  Acute kidney injury on CKD IIIb: Baseline creatinine level around 2.0.  Acute kidney injury likely ischemic ATN due to ex lap surgery/IV contrast/hypotension  Currently on CRRT with all fluids 4K/2.5Ca; dialysate at 1,500 mL/hr, pre- and post filter fluids at 400 mL/hr.  Goal UF 0-50 mL/hr as tolerated.  Agree with not intervening on right RAS given that his right kidney is already much smaller than anticipated, discussed with VVS at that time. Likely not much function being contributed from right.   Given poor mental status despite being off sedation and worsening kidney function, started HD on 9/18 after discussing with patient's wife. S/p IHD #2 9/19. Transitioned to CRRT 9/20 given worsening hemodynamics and given that he received contrast (which was absolutely necessary) will c/w CRRT today especially he is getting extubated.  Strict ins and out and close lab monitoring.   Acute mesenteric ischemia: Status post small bowel resection and superior mesenteric artery stent placement.  Followed by vascular surgeon and currently on agatroban since there was inadequate response to heparin. S/p OR again 9/16 for exlap and further small bowel resection, abd closed. S/p CTA 9/20 without anything emergent Vent dependent respiratory failure: Per PCCM. RRT for clearance. Working towards extubation today  Anemia likely due to blood loss: transfuse prn for hgb <7  Shock - pressor support and anx per primary service  Severe protein malnutrition - getting IV albumin today per PCCM Hypophosphatemia - will replete with sodium phosphate and follow.  Irena Cords, MD [facility]

## 2023-01-01 NOTE — TOC Progression Note (Signed)
Transition of Care [facility]) - Progression Note    Patient Details  Name: [name] [name] MRN: [id number] Date of Birth: 1955/09/30  Transition of Care [facility]) CM/SW Contact  Elliot Cousin, RN Phone Number: [phone] 01/01/2023, 2:34 PM  Clinical Narrative:  CM spoke to wife at bedside. States she would be interested in IP rehab vs SNF. Explained once pt medically ready, PT/OT will complete evaluation and make recommendations.     Expected Discharge Plan: IP Rehab Facility Barriers to Discharge: Continued Medical Work up  Expected Discharge Plan and Services In-house Referral: NA Discharge Planning Services: CM Consult Post Acute Care Choice: IP Rehab Living arrangements for the past 2 months: Single Family Home                                       Social Determinants of Health (SDOH) Interventions SDOH Screenings   Food Insecurity: No Food Insecurity (12/20/2022)  Housing: Low Risk  (12/20/2022)  Transportation Needs: No Transportation Needs (12/20/2022)  Utilities: Not At Risk (12/20/2022)  Alcohol Screen: Low Risk  (11/20/2022)  Depression (PHQ2-9): Low Risk  (11/20/2022)  Financial Resource Strain: Low Risk  (12/01/2022)  Physical Activity: Sufficiently Active (12/01/2022)  Social Connections: Moderately Integrated (12/01/2022)  Stress: No Stress Concern Present (12/01/2022)  Tobacco Use: Low Risk  (12/21/2022)  Health Literacy: Adequate Health Literacy (11/20/2022)    Readmission Risk Interventions    12/26/2022   11:40 AM  Readmission Risk Prevention Plan  Transportation Screening Complete  HRI or Home Care Consult Complete  Social Work Consult for Recovery Care Planning/Counseling Complete  Palliative Care Screening Not Applicable  Medication Review [employer]) Complete

## 2023-01-01 NOTE — Progress Notes (Signed)
NAME:  [name] [name], MRN:  [id number], DOB:  [birth year], LOS: 12 ADMISSION DATE:  12/20/2022, CONSULTATION DATE: 9/12 REFERRING MD: Dr. Jacqulyn Bath, CHIEF COMPLAINT: Mesenteric ischemia  History of Present Illness:   67 year old male with past medical history as below, which is significant for intestinal stromal tumor removal in 2022, known mesenteric stenosis, GERD, hypertension, and hyperlipidemia.  In the few weeks prior to presentation on 9/11 he had been evaluated at several campuses for abdominal pain with noncontrasted CTs of the abdomen which were unrevealing.  He continued to have abdominal pain, weight loss, and poor p.o. intake.  He presented [name] on 9/11 and was admitted to the hospitalist for abdominal pain with concern for superior mesenteric artery thrombus.  He was started on heparin infusion.  Vascular surgery was consulted and ultimately a CT angiogram of the abdomen and pelvis was done 9/12 demonstrating acute occlusion of the distal SMA as well as occlusion of the inferior mesenteric artery.  He was taken to the operating room emergently and and was found to have an necrotic area of the jejunum.  This was resected, and SMA stent was placed, and the patient was left with an open abdomen.  He also remained on the mechanical ventilator postoperatively and PCCM was asked to admit for ventilator management.  Pertinent  Medical History   has a past medical history of Ankle fracture, Cancer (HCC) (1974), Cataract, GERD (gastroesophageal reflux disease), colonic polyps, Hyperlipidemia, Hypertension, Kidney stones, Patella fracture, Retinal detachment, Retinal tear of right eye, and Syncopal episodes (2008).   Significant Hospital Events: Including procedures, antibiotic start and stop dates in addition to other pertinent events   9/11 admitted with abdominal pain 9/12 CT demonstrating mesenteric occlusion, bowel resection and stent placed, to ICU on vent. Left in discontinuity  9/14  back to OR. Left in discontinuity as still concern about bowel integrity 9/16 back to OR  s sig areas of ischemia w/ 2 areas of perforation, another 66cm section of jejunum was resected and area of discontinuity was reanastomosed. Changed to argatroban post-op as unable to get heparin levels detectable  9/17 dropping RASS goal to 0 to -1, assessing for readiness to wean, serum creatinine and BUN still climbing some but making urine 9/18 iHD  9/20 rising pressor need, anemia: CTA abd: nothing emergent, transfused 9/22 extubated  Interim History / Subjective:  Extubated yesterday, tolerated well Off pressors. BP on soft side 91/59 Making a little urine, 45cc this AM so far, 616cc past 24 hrs  Objective   Blood pressure (!) 87/57, pulse (!) 102, temperature 100.2 F (37.9 C), resp. rate 18, height 5\' 11"  (1.803 m), weight 99 kg, SpO2 100%. CVP:  [0 mmHg-8 mmHg] 1 mmHg      Intake/Output Summary (Last 24 hours) at 01/01/2023 0831 Last data filed at 01/01/2023 0800 Gross per 24 hour  Intake 3074.35 ml  Output 4646.3 ml  Net -1571.95 ml   Filed Weights   12/30/22 0400 12/31/22 0400 01/01/23 0500  Weight: 101 kg 101.2 kg 99 kg    Examination: General: Adult male, critically ill but in NAD. Neuro: Awake but globally weak, does answer basic questions. HEENT: [place]/AT. Sclerae anicteric. EOMI. Cardiovascular: RRR, no M/R/G.  Lungs: Respirations even and unlabored.  CTA bilaterally, No W/R/R. Abdomen: Abdominal dressings C/D/I. Abd soft and tender throughout. BS hypoactive. Musculoskeletal: No gross deformities, no edema.  Skin: Intact, warm, no rashes.    Assessment & Plan:   Acute mesenteric ischemia: Status post small  bowel resection and superior mesenteric artery stent placement 9/12.  Open abdomen, left in discontinuity since 9/12, another 66 cm of jejunum was resected on 9/16 prior to successful reanastomosis of the small bowel Postoperative hypoxic respiratory failure - extubated  9/22, currently on room air and doing well Acute encephalopathy: resolved AKI on CKD3: in context of mesenteric ischemia, fluid shifts and likely ATN.  On CRRT Hypophosphatemia Postop anemia- transfused 9/20;  CT abd benign 9/20 Protein calorie malnutrition : significant weight loss prior to this admission, now NPO Sacral decub/deep tissue wound. Still not at place where can be staged-WOC following Hx HTN, cataracts, GERD, HLD   - Continue supportive care - General surgery and vascular surgery following, appreciate the assistance - Nephrology following for CRRT, appreciate the assistance - CRRT even goal, making urine gradually, hopeful for continued output and continued renal recovery - Replete phos - Continue vanc/meropenem through 9/25 for ?translocation - Argatroban for [facility] - Transfuse for Hgb < 7 - Continue TPN, not yet ready for tube feeds per surgery - Albumin x 1   Best Practice (right click and "Reselect all SmartList Selections" daily)   Diet/type: TPN DVT prophylaxis: argatroban GI prophylaxis: PPI Lines: Central line and Dialysis Catheter Foley:  Yes, and it is still needed Code Status:  full code Last date of multidisciplinary goals of care discussion [updated wife at bedside 9/22]   CC time: 30 min.   Rutherford Guys, PA - [facility] For pager details, please see AMION or use Epic chat  After 1900, please call [facility] for cross coverage needs 01/01/2023, 8:50 AM

## 2023-01-01 NOTE — Progress Notes (Addendum)
  Progress Note    01/01/2023 7:32 AM 7 Days Post-Op  Subjective:  on CRRT this morning. Doesn't feel as weak. Wants to get out of bed. Denies passing flatus or having BM    Vitals:   01/01/23 0600 01/01/23 0700  BP: (!) 102/55 (!) 94/59  Pulse: (!) 101 (!) 102  Resp: 19 15  Temp: 100 F (37.8 C) 100 F (37.8 C)  SpO2: 100% 100%    Physical Exam: General:  laying in bed comfortably, NGT in place with nonbloody bilious output. On CRRT Cardiac:  regular Lungs:  nonlabored, on RA Extremities:  bilateral DP doppler signals Abdomen:  soft and bandaged, slightly distended  CBC    Component Value Date/Time   WBC 21.1 (H) 01/01/2023 0351   RBC 3.39 (L) 01/01/2023 0351   HGB 9.2 (L) 01/01/2023 0351   HCT 28.7 (L) 01/01/2023 0351   PLT 228 01/01/2023 0351   MCV 84.7 01/01/2023 0351   MCH 27.1 01/01/2023 0351   MCHC 32.1 01/01/2023 0351   RDW 16.3 (H) 01/01/2023 0351   LYMPHSABS 0.9 10/30/2022 1654   MONOABS 0.6 10/30/2022 1654   EOSABS 0.3 10/30/2022 1654   BASOSABS 0.0 10/30/2022 1654    BMET    Component Value Date/Time   NA 137 01/01/2023 0351   K 3.7 01/01/2023 0351   CL 104 01/01/2023 0351   CO2 24 01/01/2023 0351   GLUCOSE 230 (H) 01/01/2023 0351   BUN 75 (H) 01/01/2023 0351   CREATININE 2.07 (H) 01/01/2023 0351   CALCIUM 7.2 (L) 01/01/2023 0351   GFRNONAA 34 (L) 01/01/2023 0351   GFRAA >60 04/14/2015 1127    INR    Component Value Date/Time   INR 1.7 (H) 12/29/2022 0809     Intake/Output Summary (Last 24 hours) at 01/01/2023 0732 Last data filed at 01/01/2023 0700 Gross per 24 hour  Intake 3239.37 ml  Output 4721.1 ml  Net -1481.73 ml      Assessment/Plan:  67 y.o. male is s/p: SMA stenting, small bowel resection   -Extubated over the weekend, doing well this morning. Currently on CRRT, may be able to stop today -Denies any abdominal pain. Has not passed flatus or had a BM. Remains NPO and on TPN. -No evidence of further bleeding. Hgb at  9.2 this morning. NGT with nonbloody, bilious output -Abdomen is soft and slightly distended, unchanged from this weekend -Bilateral DP doppler signals -Continue ASA and argatroban   [name] Dubonnet, PA-C [facility] [phone] 01/01/2023 7:32 AM  VASCULAR STAFF ADDENDUM: I have independently interviewed and examined the patient. I agree with the above.  Signals right PT, Left DP Continue argatroban. No bleeding concerns  Appreciate CCM, Nephrology, gen surgery and nursing Stent widely patent on most recent CT  Victorino Sparrow MD [facility] Phone Number: [phone] 01/01/2023 1:24 PM

## 2023-01-01 NOTE — Progress Notes (Signed)
ANTICOAGULATION CONSULT NOTE - Follow Up  Pharmacy Consult for heparin >> argatroban Indication: SMA stenosis/occlusion  Allergies  Allergen Reactions   Zestril [Lisinopril] Cough    Patient Measurements: Height: 5\' 11"  (180.3 cm) Weight: 99 kg (218 lb 4.1 oz) IBW/kg (Calculated) : 75.3 Heparin Dosing Weight: 94.6kg  Vital Signs: Temp: 100.2 F (37.9 C) (09/23 0900) Temp Source: Rectal (09/23 0800) BP: 100/64 (09/23 0900) Pulse Rate: 99 (09/23 0900)  Labs: Recent Labs    12/30/22 0458 12/30/22 1600 12/31/22 0411 12/31/22 1508 12/31/22 2102 01/01/23 0351  HGB 9.0*   < > 7.9* 7.7* 7.8* 9.2*  HCT 26.8*   < > 24.6* 23.8* 23.0* 28.7*  PLT 188   < > 175 196  --  228  APTT 42*  --  44*  --   --  46*  CREATININE 2.91*   < > 2.36* 2.08*  --  2.07*   < > = values in this interval not displayed.    Estimated Creatinine Clearance: 41 mL/min (A) (by C-G formula based on SCr of 2.07 mg/dL (H)).   Assessment: 30 YOM presenting with abdominal pain found to have mesenteric ischemia. Pharmacy consulted for IV heparin. No AC PTA. Pt s/p ex lap, bowel resection, thrombectomy, and angioplasty 9/12, heparin resumed postop.  Despite multiple rate increases, unable to get heparin to a detectable level, so patient was transitioned to direct thrombin inhibitor argatroban.  Dark OG output and increased pressor need 9/20 so argatroban reduced with goal aPTT ~50 seconds. CT negative for acute bleed, OG output more bilious now, increased aPTT goal back to 50-70.  aPTT today remains subtherapeutic at 46 seconds, CBC ok, no S/Sx bleeding. Fixed-dose heparin infusing through CRRT circuit.  Goal of Therapy:  aPTT 50-90 seconds  Monitor platelets by anticoagulation protocol: Yes   Plan:  Increase argatroban at 0.17 mcg/kg/min  Daily aPTT, CBC   Fredonia Highland, PharmD, BCPS, [facility] Clinical Pharmacist [phone] Please check AMION for all [facility] Pharmacy numbers 01/01/2023

## 2023-01-01 NOTE — Progress Notes (Signed)
Pharmacy Antibiotic Note  [name] [name] is a 67 y.o. male admitted on 12/20/2022 with open abdomen in discontinuity since 9/12. Patient underwent SMA stent and found to have necrotic area of the jejunum now s/p resection with reanastomosis of the small bowel and abdominal closure 9/16.  Completed a 7-day course of Zosyn 9/19 and now concerns for ongoing shock. Pharmacy has been consulted for vancomycin and meropenem dosing.  Remains on CRRT, WBC coming down, afebrile, planning ABX through 9/25. Will defer vancomycin levels for now.  Plan: Vancomycin 1000mg  IV q24h  Merrem 1g IV q8h Follow up LOT, signs of clinical improvement and levels as indicated  Height: 5\' 11"  (180.3 cm) Weight: 99 kg (218 lb 4.1 oz) IBW/kg (Calculated) : 75.3  Temp (24hrs), Avg:98.8 F (37.1 C), Min:97.5 F (36.4 C), Max:100.2 F (37.9 C)  Recent Labs  Lab 12/29/22 0756 12/29/22 1034 12/29/22 1249 12/29/22 1255 12/29/22 1558 12/30/22 0458 12/30/22 1600 12/31/22 0411 12/31/22 1508 01/01/23 0351  WBC  --   --    < >  --    < > 33.0* 30.1* 26.9* 27.0* 21.1*  CREATININE  --   --   --   --    < > 2.91* 2.62* 2.36* 2.08* 2.07*  LATICACIDVEN 1.5 1.4  --  1.6  --   --   --   --   --   --    < > = values in this interval not displayed.    Estimated Creatinine Clearance: 41 mL/min (A) (by C-G formula based on SCr of 2.07 mg/dL (H)).    Allergies  Allergen Reactions   Zestril [Lisinopril] Cough    Antimicrobials this admission: Zosyn 9/13 >> 9/19 Vancomycin 9/20 >> (9/25) Meropenem 9/20 >> (9/25)  Thank you for allowing pharmacy to participate in this patient's care,  Fredonia Highland, PharmD, BCPS, [facility] Clinical Pharmacist [phone] Please check AMION for all [facility] Pharmacy numbers 01/01/2023

## 2023-01-01 NOTE — Progress Notes (Signed)
[facility] Progress Note  7 Days Post-Op  Subjective: Off pressors.  Extubated.    Objective: Vital signs in last 24 hours: Temp:  [97.5 F (36.4 C)-100.4 F (38 C)] 100.2 F (37.9 C) (09/23 1015) Pulse Rate:  [72-104] 98 (09/23 1015) Resp:  [9-35] 18 (09/23 1015) BP: (85-178)/(46-79) 102/57 (09/23 1015) SpO2:  [94 %-100 %] 99 % (09/23 1015) Weight:  [99 kg] 99 kg (09/23 0500) Last BM Date : 12/31/22 (Maroon smear per prior shift)  Intake/Output from previous day: 09/22 0701 - 09/23 0700 In: 3239.4 [I.V.:2739.3; IV Piggyback:500] Out: 4721.1 [Urine:616; Emesis/NG output:10] Intake/Output this shift: Total I/O In: 429.6 [I.V.:317.8; Other:10; IV Piggyback:101.8] Out: 307 [Urine:45]  PE: Gen:  sleepy, weak.  But follows commands.  HEENT: NG with bilious fluid Card:  RRR Pulm:  intubated, on vent Abd: mildly distended but soft, midline incision open at skin, wound is clean and dry.   Lab Results:  Recent Labs    12/31/22 1508 12/31/22 2102 01/01/23 0351  WBC 27.0*  --  21.1*  HGB 7.7* 7.8* 9.2*  HCT 23.8* 23.0* 28.7*  PLT 196  --  228   BMET Recent Labs    12/31/22 1508 12/31/22 2102 01/01/23 0351  NA 137 138 137  K 3.7 3.4* 3.7  CL 102  --  104  CO2 25  --  24  GLUCOSE 128*  --  230*  BUN 74*  --  75*  CREATININE 2.08*  --  2.07*  CALCIUM 7.6*  --  7.2*   PT/INR No results for input(s): "LABPROT", "INR" in the last 72 hours.  CMP     Component Value Date/Time   NA 137 01/01/2023 0351   K 3.7 01/01/2023 0351   CL 104 01/01/2023 0351   CO2 24 01/01/2023 0351   GLUCOSE 230 (H) 01/01/2023 0351   BUN 75 (H) 01/01/2023 0351   CREATININE 2.07 (H) 01/01/2023 0351   CALCIUM 7.2 (L) 01/01/2023 0351   PROT 4.6 (L) 01/01/2023 0351   PROT 6.3 03/16/2020 0800   ALBUMIN 1.6 (L) 01/01/2023 0351   ALBUMIN 4.3 03/16/2020 0800   AST 221 (H) 01/01/2023 0351   ALT 209 (H) 01/01/2023 0351   ALKPHOS 121 01/01/2023 0351   BILITOT 1.4 (H)  01/01/2023 0351   BILITOT 0.8 03/16/2020 0800   GFRNONAA 34 (L) 01/01/2023 0351   GFRAA >60 04/14/2015 1127   Lipase     Component Value Date/Time   LIPASE 28 11/27/2022 0755       Assessment/Plan 67 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 Karin Lieu, exploratory laparotomy and small bowel resection Dr. Janee Morn S/p re-exploration, replace abthera FB 12/23/22   S/p re-exploration, small bowel resection with anastomosis x2, assessment of bowel perfusion with ICG, abdominal closure 9/16 Dr. Andrey Campanile - No signs of ongoing GI bleeding - Continue TPN - ice chips ok.    LOS: 12 days   Maudry Diego, MD, FACS, FSSO Surgical Oncology, General Surgery, Trauma and [facility], [place] [phone] for weekday/non holidays Check [email or web address] for coverage night/weekend/holidays    01/01/23 10:24 AM

## 2023-01-02 DIAGNOSIS — K551 Chronic vascular disorders of intestine: Secondary | ICD-10-CM | POA: Diagnosis not present

## 2023-01-02 LAB — RENAL FUNCTION PANEL
Albumin: 1.7 g/dL — ABNORMAL LOW (ref 3.5–5.0)
Anion gap: 10 (ref 5–15)
BUN: 64 mg/dL — ABNORMAL HIGH (ref 8–23)
CO2: 24 mmol/L (ref 22–32)
Calcium: 7.6 mg/dL — ABNORMAL LOW (ref 8.9–10.3)
Chloride: 103 mmol/L (ref 98–111)
Creatinine, Ser: 1.97 mg/dL — ABNORMAL HIGH (ref 0.61–1.24)
GFR, Estimated: 36 mL/min — ABNORMAL LOW (ref >60)
Glucose, Bld: 163 mg/dL — ABNORMAL HIGH (ref 70–99)
Phosphorus: 3.5 mg/dL (ref 2.5–4.6)
Potassium: 3.4 mmol/L — ABNORMAL LOW (ref 3.5–5.1)
Sodium: 137 mmol/L (ref 135–145)

## 2023-01-02 LAB — GLUCOSE, CAPILLARY
Glucose-Capillary: 138 mg/dL — ABNORMAL HIGH (ref 70–99)
Glucose-Capillary: 148 mg/dL — ABNORMAL HIGH (ref 70–99)
Glucose-Capillary: 148 mg/dL — ABNORMAL HIGH (ref 70–99)
Glucose-Capillary: 149 mg/dL — ABNORMAL HIGH (ref 70–99)
Glucose-Capillary: 155 mg/dL — ABNORMAL HIGH (ref 70–99)
Glucose-Capillary: 157 mg/dL — ABNORMAL HIGH (ref 70–99)
Glucose-Capillary: 157 mg/dL — ABNORMAL HIGH (ref 70–99)
Glucose-Capillary: 158 mg/dL — ABNORMAL HIGH (ref 70–99)
Glucose-Capillary: 162 mg/dL — ABNORMAL HIGH (ref 70–99)
Glucose-Capillary: 189 mg/dL — ABNORMAL HIGH (ref 70–99)
Glucose-Capillary: 208 mg/dL — ABNORMAL HIGH (ref 70–99)
Glucose-Capillary: 212 mg/dL — ABNORMAL HIGH (ref 70–99)
Glucose-Capillary: 224 mg/dL — ABNORMAL HIGH (ref 70–99)
Glucose-Capillary: 298 mg/dL — ABNORMAL HIGH (ref 70–99)

## 2023-01-02 LAB — CBC
HCT: 24.2 % — ABNORMAL LOW (ref 39.0–52.0)
Hemoglobin: 7.8 g/dL — ABNORMAL LOW (ref 13.0–17.0)
MCH: 26.6 pg (ref 26.0–34.0)
MCHC: 32.2 g/dL (ref 30.0–36.0)
MCV: 82.6 fL (ref 80.0–100.0)
Platelets: 111 10*3/uL — ABNORMAL LOW (ref 150–400)
RBC: 2.93 MIL/uL — ABNORMAL LOW (ref 4.22–5.81)
RDW: 16.6 % — ABNORMAL HIGH (ref 11.5–15.5)
WBC: 21.2 10*3/uL — ABNORMAL HIGH (ref 4.0–10.5)
nRBC: 0 % (ref 0.0–0.2)

## 2023-01-02 LAB — APTT: aPTT: 62 seconds — ABNORMAL HIGH (ref 24–36)

## 2023-01-02 LAB — MAGNESIUM: Magnesium: 2.3 mg/dL (ref 1.7–2.4)

## 2023-01-02 MED ORDER — AMIODARONE HCL IN DEXTROSE 360-4.14 MG/200ML-% IV SOLN
60.0000 mg/h | INTRAVENOUS | Status: AC
  Administered 2023-01-02 (×3): 60 mg/h via INTRAVENOUS
  Filled 2023-01-02 (×3): qty 200

## 2023-01-02 MED ORDER — ALBUMIN HUMAN 25 % IV SOLN
25.0000 g | Freq: Two times a day (BID) | INTRAVENOUS | Status: AC
  Administered 2023-01-02 (×2): 25 g via INTRAVENOUS
  Filled 2023-01-02 (×2): qty 100

## 2023-01-02 MED ORDER — AMIODARONE LOAD VIA INFUSION
150.0000 mg | Freq: Once | INTRAVENOUS | Status: AC
  Administered 2023-01-02: 150 mg via INTRAVENOUS
  Filled 2023-01-02: qty 83.34

## 2023-01-02 MED ORDER — BOOST / RESOURCE BREEZE PO LIQD CUSTOM: 1.0000 | Freq: Three times a day (TID) | ORAL | Status: DC

## 2023-01-02 MED ORDER — AMIODARONE HCL IN DEXTROSE 360-4.14 MG/200ML-% IV SOLN
30.0000 mg/h | INTRAVENOUS | Status: DC
  Administered 2023-01-02 – 2023-01-14 (×21): 30 mg/h via INTRAVENOUS
  Filled 2023-01-02 (×23): qty 200

## 2023-01-02 MED ORDER — POTASSIUM CHLORIDE 10 MEQ/50ML IV SOLN
10.0000 meq | INTRAVENOUS | Status: AC
  Administered 2023-01-02 (×4): 10 meq via INTRAVENOUS
  Filled 2023-01-02 (×4): qty 50

## 2023-01-02 MED ORDER — TRACE MINERALS CU-MN-SE-ZN 300-55-60-3000 MCG/ML IV SOLN
INTRAVENOUS | Status: AC
  Filled 2023-01-02: qty 985.6

## 2023-01-02 MED ORDER — MAGNESIUM SULFATE 2 GM/50ML IV SOLN
2.0000 g | Freq: Once | INTRAVENOUS | Status: AC
  Administered 2023-01-02: 2 g via INTRAVENOUS
  Filled 2023-01-02: qty 50

## 2023-01-02 NOTE — Progress Notes (Signed)
PHARMACY - TOTAL PARENTERAL NUTRITION CONSULT NOTE  Indication:  SMA syndrome w associated weight loss  Patient Measurements: Height: 5\' 11"  (180.3 cm) Weight: 99.3 kg (218 lb 14.7 oz) IBW/kg (Calculated) : 75.3 TPN AdjBW (KG): 80.4 Body mass index is 30.53 kg/m. Usual Weight: 95 kg, has lost 50lbs recently   Assessment:  67 yo M admitted with abdominal pain, weight loss, and poor PO intake. 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 He is now s/p SMA stent placement. He was initially left is discontinuity with open abdomen and was closed on 9/16. TPN was initiated 9/14.   Overnight events: CRRT clotted and stopped. Transitioning to iHD. TPN increased by RN to 100 ml/hr but ordered for 80 ml/hr- as bag will run out will have to run at lower rate for remainder of time until new bag is hung at 1800.   Glucose / Insulin: A1c 6.3% - CBGs 140-150s Used 20 units SSI in the past 24 hrs. Electrolytes: Na 137, K dropped to 3.4 ( no replacement per renal due to changes in dialysis), Phos 3.5 (s/p NaPhos yesterday), Mag 2.3 (s/p 2gm on 9/20; reduced in TPN 9/21), others WNL Renal: iHD x2 days, CRRT clotted 3 systems yesterday and stopped CRRT at [facility] 9/24 with plans to not resume and transition to iHD - SCr and BUN trend down. Hepatic: LFTs increased (AST/ALT 221/209), Tbili up 1.4 (no jaundice), albumin 1.6, TG mildly elevated at 160 Intake / Output; MIVF: UOP 0.2 ml/kg/hr, NG down 280 mL (coffee ground), wound vac output not reported for last 24 hrs, net +5.3L, LBM 9/22.   GI Imaging:  9/20 CTA: improved ostial stenosis post stenting, thrombus/occlusion of SMA/ileocolic artery improving, surgical site has increased dilatation, L lung nodule GI Surgeries / Procedures:  9/14 re-ex lap, left in discontinuity with plans for re-exploration  9/16 re-ex lap, SBR with anastomosis x2, abdominal closure Wounds (aside from abd): Deep  tissue pressure injury to B/L buttocks  Central access: CVC LIJ 12/23/22 TPN start date: 12/23/22  Nutritional Goals: Goal concentrated TPN rate is 80 mL/hr (provides 148 g AA and 2320 kCal per day)  RD Estimated Needs Total Energy Estimated Needs: 2300-2500 kcals Total Protein Estimated Needs: 140 -150 g Total Fluid Estimated Needs: >/= 2.3 L  Current Nutrition:  TPN  Ice chips Off TF 9/20 with concern for bowel ischemia > NPO  Plan:  *Reduce current TPN rate to 57 ml/hr for next 9 hours to prevent running out of TPN until new TPN bag is hung at 1800.  Continue with concentrated TPN at goal rate of 80 ml/hr at 1800 to meet 100% of needs Electrolytes in TPN: Na 150 mEq/L, K 7.8 mEq/L (~15 mEq/day), Ca 6.5 mEq/L, Mg 4 mEq/L, increase to Phos 7 mmol/L, Cl:Ac 1:1.  Add standard MVI and trace elements to TPN No chromium due to RRT and on shortage Continue resistant SSI Q4H while on steroid.  Add long-acting insulin if needed - holding off for now with TPN rate issues today.  Monitor TPN labs on Mon/Thurs >> renal function panel BID per MD.  F/U with restarting trickle feed when able  Per discussion with Nephrology - no further K, ok to keep at current amount in TPN.   Link Snuffer, PharmD, BCPS, BCCCP Please refer to [facility] for [facility] Pharmacy numbers 01/02/2023, 8:58 AM

## 2023-01-02 NOTE — Progress Notes (Signed)
Nutrition Follow-up  DOCUMENTATION CODES:   Severe malnutrition in context of acute illness/injury  INTERVENTION:   Continue TPN to meet 100% nutritional needs  Recommend continuing TPN until pt tolerating solid food and meeting at least 60% of estimated nutritional needs  If tolerates CL diet, plan to add oral nutrition supplements  NUTRITION DIAGNOSIS:   Severe Malnutrition related to acute illness as evidenced by energy intake < or equal to 50% for > or equal to 5 days, percent weight loss.  Being addressed via TPN  GOAL:   Patient will meet greater than or equal to 90% of their needs  Met  MONITOR:   Diet advancement, Labs, Weight trends, Skin, I & O's (TPN)  REASON FOR ASSESSMENT:   Ventilator, Consult New TPN/TNA  ASSESSMENT:   67 yo male admitted with SMA stenosis/thrombosis and SB ischemia requiring SMA thrombectomy and stent followed by ex lap and SB resection. Pt with poor po intake and weight loss for several weeks PTA. 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, CKD 3, GERD, hx testicular cancer, HLD, HTN  9/11 Admitted 9/12 OR: acute on chronic mesenteric ischemia with necrotic area of jejunum requiring SB resection, bowel left in discontinuity, abdomen open with ABTherea wound VAC, SMA thrombectomy and stent 9/14 OR: reopening of recent laparotomy, evaluation of SMA with Doppler including mesentery of small bowel, placement of abthera device  9/16 OR: sig areas of ischemia with 2 areas of perforation, another 66 cm section of jejunum was resected and area of discontinuity was reanastomosed 9/18 iHD 9/19 2nd iHD 9/20 CRRT initiated, GI bleed 9/22 Extubated 9/24 CRRT stopped due to clotting issues overnight, CL diet, NG clamped  CRRT clotting issues overnight so currently on hold with plans for iHD tomorrow. Currently oliguric  Pt with intermittent confusion, very weak/deconditioned Noted NG clamped  with diet advanced to CL today  No further signs of GI bleed  TPN at 80 ml/hr providing 2320 kcals, 148 g of protein and 1920 mL fluid. No recommend changes to calorie and protein goals currently; plan to monitor tolerance and progression of po diet, iHD vs re-initiation of CRRT, etc   Noted WOC follow-up today re: DTIs to bilateral buttocks. Left side has healed; Right side 2x4 cm, dark red fluid blister beginning to peel at edges  CBGs currently well controlled with ICU goal 140-180.   Current wt 99.3 kg; admit wt 95.7 kg. Highest wt 107.2 kg. +mild edema on exma  Labs: BUN 64, Creatinine 1.97, sodium 137 (wdl), potassium 3.4 (L), phosphorus 3.5 (wdl), magnesium 2.3 (L) Meds: reviewed  Diet Order:   Diet Order             Diet clear liquid Room service appropriate? Yes; Fluid consistency: Thin  Diet effective now                   EDUCATION NEEDS:   Not appropriate for education at this time  Skin:  Skin Assessment: Skin Integrity Issues: Skin Integrity Issues:: DTI DTI: bilateral buttocks Wound Vac: n/a  Last BM:  9/23 red/brown smear  Height:   Ht Readings from Last 1 Encounters:  12/20/22 5\' 11"  (1.803 m)    Weight:   Wt Readings from Last 1 Encounters:  01/02/23 99.3 kg    BMI:  Body mass index is 30.53 kg/m.  Estimated Nutritional Needs:   Kcal:  2300-2500 kcals  Protein:  140 -150 g  Fluid:  >/=  2.3 L   Romelle Starcher MS, RDN, LDN, CNSC Registered Dietitian 3 Clinical Nutrition RD Pager and On-Call Pager Number Located in [place]

## 2023-01-02 NOTE — Progress Notes (Signed)
ANTICOAGULATION CONSULT NOTE - Follow Up  Pharmacy Consult for heparin >> argatroban Indication: SMA stenosis/occlusion  Allergies  Allergen Reactions   Zestril [Lisinopril] Cough    Patient Measurements: Height: 5\' 11"  (180.3 cm) Weight: 99.3 kg (218 lb 14.7 oz) IBW/kg (Calculated) : 75.3 Heparin Dosing Weight: 94.6kg  Vital Signs: Temp: 99.5 F (37.5 C) (09/24 0800) Temp Source: Rectal (09/24 0400) BP: 120/50 (09/24 0700) Pulse Rate: 89 (09/24 0800)  Labs: Recent Labs    12/31/22 0411 12/31/22 1508 01/01/23 0351 01/01/23 1624 01/02/23 0323  HGB 7.9*   < > 9.2* 8.0* 7.8*  HCT 24.6*   < > 28.7* 24.9* 24.2*  PLT 175   < > 228 158 111*  APTT 44*  --  46*  --  62*  CREATININE 2.36*   < > 2.07* 2.19* 1.97*   < > = values in this interval not displayed.    Estimated Creatinine Clearance: 43.1 mL/min (A) (by C-G formula based on SCr of 1.97 mg/dL (H)).   Assessment: 59 YOM presenting with abdominal pain found to have mesenteric ischemia. Pharmacy consulted for IV heparin. No AC PTA. Pt s/p ex lap, bowel resection, thrombectomy, and angioplasty 9/12, heparin resumed postop.  Despite multiple rate increases, unable to get heparin to a detectable level, so patient was transitioned to direct thrombin inhibitor argatroban.  Dark OG output and increased pressor need 9/20 so argatroban reduced with goal aPTT ~50 seconds. CT negative for acute bleed, OG output more bilious now, increased aPTT goal back to 50-70.  aPTT today is therapeutic at 62 seconds. Hgb (7.8) is low stable, PLTs (111) slightly decreased from yesterday. Per RN, no issues with the line or signs of bleeding. Fixed-dose heparin infusing through CRRT circuit.  Goal of Therapy:  aPTT 50-90 seconds  Monitor platelets by anticoagulation protocol: Yes   Plan:  Continue argatroban at 0.17 mcg/kg/min  Monitor daily APTT and CBC Monitor for signs/symptoms of bleeding   Romie Minus, PharmD PGY1 Pharmacy  Resident  Please check AMION for all [facility] Pharmacy phone numbers After 10:00 PM, call Main Pharmacy [phone]

## 2023-01-02 NOTE — Progress Notes (Signed)
[facility] Progress Note  8 Days Post-Op  Subjective: Sleepy this morning.  Some confusion yesterday   Objective: Vital signs in last 24 hours: Temp:  [99.1 F (37.3 C)-100.4 F (38 C)] 99.5 F (37.5 C) (09/24 0800) Pulse Rate:  [85-110] 89 (09/24 0800) Resp:  [13-25] 20 (09/24 0800) BP: (76-128)/(43-99) 120/50 (09/24 0700) SpO2:  [93 %-100 %] 93 % (09/24 0800) Weight:  [99.3 kg] 99.3 kg (09/24 0300) Last BM Date : 01/01/23  Intake/Output from previous day: 09/23 0701 - 09/24 0700 In: 3329.5 [I.V.:2397.3; IV Piggyback:922.2] Out: 4076 [Urine:515; Emesis/NG output:280] Intake/Output this shift: No intake/output data recorded.  PE: Gen:  sleepy, weak.  But follows commands.  HEENT: NG with bilious fluid Card:  RRR Pulm:  intubated, on vent Abd: mildly distended but soft, midline incision open at skin, wound is clean and dry.   Lab Results:  Recent Labs    01/01/23 1624 01/02/23 0323  WBC 23.4* 21.2*  HGB 8.0* 7.8*  HCT 24.9* 24.2*  PLT 158 111*   BMET Recent Labs    01/01/23 1624 01/02/23 0323  NA 139 137  K 3.7 3.4*  CL 105 103  CO2 23 24  GLUCOSE 174* 163*  BUN 69* 64*  CREATININE 2.19* 1.97*  CALCIUM 7.0* 7.6*   PT/INR No results for input(s): "LABPROT", "INR" in the last 72 hours.  CMP     Component Value Date/Time   NA 137 01/02/2023 0323   K 3.4 (L) 01/02/2023 0323   CL 103 01/02/2023 0323   CO2 24 01/02/2023 0323   GLUCOSE 163 (H) 01/02/2023 0323   BUN 64 (H) 01/02/2023 0323   CREATININE 1.97 (H) 01/02/2023 0323   CALCIUM 7.6 (L) 01/02/2023 0323   PROT 4.6 (L) 01/01/2023 0351   PROT 6.3 03/16/2020 0800   ALBUMIN 1.7 (L) 01/02/2023 0323   ALBUMIN 4.3 03/16/2020 0800   AST 221 (H) 01/01/2023 0351   ALT 209 (H) 01/01/2023 0351   ALKPHOS 121 01/01/2023 0351   BILITOT 1.4 (H) 01/01/2023 0351   BILITOT 0.8 03/16/2020 0800   GFRNONAA 36 (L) 01/02/2023 0323   GFRAA >60 04/14/2015 1127   Lipase     Component Value  Date/Time   LIPASE 28 11/27/2022 0755       Assessment/Plan 67 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 Karin Lieu, exploratory laparotomy and small bowel resection Dr. Janee Morn S/p re-exploration, replace abthera FB 12/23/22   S/p re-exploration, small bowel resection with anastomosis x2, assessment of bowel perfusion with ICG, abdominal closure 9/16 Dr. Andrey Campanile - No signs of ongoing GI bleeding - Continue TPN -Clamp NG and try clear liquids if patient awake enough to eat today.  If tolerating clears with NG clamped for 6 hours and no symptoms, then remove NG.    LOS: 13 days   Quentin Ore, MD     01/02/23 9:17 AM

## 2023-01-02 NOTE — Progress Notes (Signed)
NAME:  [name] [name], MRN:  [id number], DOB:  07-18-1955, LOS: 13 ADMISSION DATE:  12/20/2022, CONSULTATION DATE: 9/12 REFERRING MD: Dr. Jacqulyn Bath, CHIEF COMPLAINT: Mesenteric ischemia  History of Present Illness:   67 year old male with past medical history as below, which is significant for intestinal stromal tumor removal in 2022, known mesenteric stenosis, GERD, hypertension, and hyperlipidemia.  In the few weeks prior to presentation on 9/11 he had been evaluated at several campuses for abdominal pain with noncontrasted CTs of the abdomen which were unrevealing.  He continued to have abdominal pain, weight loss, and poor p.o. intake.  He presented [name] on 9/11 and was admitted to the hospitalist for abdominal pain with concern for superior mesenteric artery thrombus.  He was started on heparin infusion.  Vascular surgery was consulted and ultimately a CT angiogram of the abdomen and pelvis was done 9/12 demonstrating acute occlusion of the distal SMA as well as occlusion of the inferior mesenteric artery.  He was taken to the operating room emergently and and was found to have an necrotic area of the jejunum.  This was resected, and SMA stent was placed, and the patient was left with an open abdomen.  He also remained on the mechanical ventilator postoperatively and PCCM was asked to admit for ventilator management.  Pertinent  Medical History   has a past medical history of Ankle fracture, Cancer (HCC) (1974), Cataract, GERD (gastroesophageal reflux disease), colonic polyps, Hyperlipidemia, Hypertension, Kidney stones, Patella fracture, Retinal detachment, Retinal tear of right eye, and Syncopal episodes (2008).   Significant Hospital Events: Including procedures, antibiotic start and stop dates in addition to other pertinent events   9/11 admitted with abdominal pain 9/12 CT demonstrating mesenteric occlusion, bowel resection and stent placed, to ICU on vent. Left in discontinuity  9/14  back to OR. Left in discontinuity as still concern about bowel integrity 9/16 back to OR  s sig areas of ischemia w/ 2 areas of perforation, another 66cm section of jejunum was resected and area of discontinuity was reanastomosed. Changed to argatroban post-op as unable to get heparin levels detectable  9/17 dropping RASS goal to 0 to -1, assessing for readiness to wean, serum creatinine and BUN still climbing some but making urine 9/18 iHD  9/20 rising pressor need, anemia: CTA abd: nothing emergent, transfused 9/22 extubated  Interim History / Subjective:  Globally weak. Issues with CRRT clotting off overnight multiple times despite being on Argatroban.  Objective   Blood pressure (!) 120/50, pulse 89, temperature 99.5 F (37.5 C), resp. rate 20, height 5\' 11"  (1.803 m), weight 99.3 kg, SpO2 93%. CVP:  [0 mmHg-10 mmHg] 0 mmHg      Intake/Output Summary (Last 24 hours) at 01/02/2023 0845 Last data filed at 01/02/2023 0700 Gross per 24 hour  Intake 3213.6 ml  Output 3915 ml  Net -701.4 ml   Filed Weights   12/31/22 0400 01/01/23 0500 01/02/23 0300  Weight: 101.2 kg 99 kg 99.3 kg    Examination: General: Adult male, critically ill but in NAD. Neuro: Awake, does answer basic questions though continues to be globally weak. HEENT: [place]/AT. Sclerae anicteric. EOMI. Cardiovascular: RRR, no M/R/G.  Lungs: Respirations even and unlabored.  CTA bilaterally, No W/R/R. Abdomen: Abdominal dressings C/D/I. Abd soft and tender throughout. BS hypoactive. Musculoskeletal: No gross deformities, no edema.  Skin: Intact, warm, no rashes.    Assessment & Plan:   Acute mesenteric ischemia: Status post small bowel resection and superior mesenteric artery stent placement  9/12.  Open abdomen, left in discontinuity since 9/12, another 66 cm of jejunum was resected on 9/16 prior to successful reanastomosis of the small bowel Postoperative hypoxic respiratory failure - extubated 9/22, currently on room  air and doing well Acute encephalopathy: resolved but remains overly globally weak AKI on CKD3: in context of mesenteric ischemia, fluid shifts and likely ATN.  On CRRT Hypokalemia Postop anemia- transfused 9/20;  CT abd benign 9/20 Protein calorie malnutrition : significant weight loss prior to this admission, now NPO Sacral decub/deep tissue wound. Still not at place where can be staged-WOC following Hx HTN, cataracts, GERD, HLD   - Continue supportive care - General surgery and vascular surgery following, appreciate the assistance - Nephrology following, planning to trial on iHD 9/24 and assess how he tolerates. If doesn't tolerate and has issue with circuit/access, might need tunneled HD catheter by IR (discussed with Dr. Arrie Aran 9/24 who agrees) - iHD as above (likely tomorrow 9/25) - monitor UOP - Replete K - Continue vanc/meropenem through 9/25 for ?translocation - Argatroban for [facility] - Transfuse for Hgb < 7 - Continue TPN, not yet ready for tube feeds per surgery - Albumin q12 x 4 for now, redose as needed   Best Practice (right click and "Reselect all SmartList Selections" daily)   Diet/type: TPN DVT prophylaxis: argatroban GI prophylaxis: PPI Lines: Central line and Dialysis Catheter Foley:  Yes, and it is still needed Code Status:  full code Last date of multidisciplinary goals of care discussion [updated wife at bedside 9/22]   CC time: 30 min.   Rutherford Guys, PA - [facility] For pager details, please see AMION or use Epic chat  After 1900, please call [facility] for cross coverage needs 01/02/2023, 8:45 AM

## 2023-01-02 NOTE — Consult Note (Addendum)
WOC Nurse wound follow up Reassessment of previously noted Deep tissue pressure injuries to bilat buttocks.  Left side has healed. Right buttock 2X4cm, dark red fluid filled blister beginning to peel at edges.  Present on admission: No Dressing procedure/placement/frequency: Dressing procedure/placement/frequency: Pt is on a low airloss mattress to reduce pressure. Continue present plan of care: Apply Xeroform gauze to right sacrum wounds Q day and cover with foam dressing. Change foam dressing Q 3 days or PRN soiling. WOC team will assess weekly to determine if a change in the plan of care is indicated at that time.  Thank-you,  Cammie Mcgee MSN, RN, CWOCN, [place], CNS [phone]

## 2023-01-02 NOTE — Progress Notes (Signed)
Patient ID: [name] [name], male   DOB: July 19, 1955, 67 y.o.   MRN: [id number] S: clotted 3 systems in 24 hours despite argatroban and heparin with CRRT.  CRRT stopped last night.   O:BP (!) 120/50 (BP Location: Left Arm)   Pulse 89   Temp 99.5 F (37.5 C)   Resp 20   Ht 5\' 11"  (1.803 m)   Wt 99.3 kg   SpO2 93%   BMI 30.53 kg/m   Intake/Output Summary (Last 24 hours) at 01/02/2023 0846 Last data filed at 01/02/2023 0700 Gross per 24 hour  Intake 3213.6 ml  Output 3915 ml  Net -701.4 ml   Intake/Output: I/O last 3 completed shifts: In: 5001.2 [I.V.:3669; Other:10; IV Piggyback:1322.2] Out: 6273 [Urine:905; Emesis/NG output:290]  Intake/Output this shift:  No intake/output data recorded. Weight change: 0.3 kg Gen:ill-appearing but NAD CVS: RRR Resp: CTA Abd: hypoactive BS, soft, NT/ND Ext: no edema  Recent Labs  Lab 12/28/22 0419 12/28/22 1507 12/30/22 0458 12/30/22 1600 12/31/22 0411 12/31/22 1508 12/31/22 2102 01/01/23 0351 01/01/23 1624 01/02/23 0323  NA 137   < > 134* 137 134* 137 138 137 139 137  K 3.0*   < > 4.5 4.4 4.1 3.7 3.4* 3.7 3.7 3.4*  CL 101   < > 96* 101 99 102  --  104 105 103  CO2 25   < > 23 24 23 25   --  24 23 24   GLUCOSE 162*   < > 227* 206* 236* 128*  --  230* 174* 163*  BUN 104*   < > 78* 76* 75* 74*  --  75* 69* 64*  CREATININE 5.77*   < > 2.91* 2.62* 2.36* 2.08*  --  2.07* 2.19* 1.97*  ALBUMIN 1.6*   < > 1.8* 1.8* 1.7* 1.7*  --  1.6* 1.7* 1.7*  CALCIUM 8.0*   < > 7.9* 8.0* 7.7* 7.6*  --  7.2* 7.0* 7.6*  PHOS 2.9   < > 2.8 2.6 2.8 1.7*  --  2.1* 5.5* 3.5  AST 46*  --   --   --   --   --   --  221*  --   --   ALT 22  --   --   --   --   --   --  209*  --   --    < > = values in this interval not displayed.   Liver Function Tests: Recent Labs  Lab 12/28/22 0419 12/29/22 0502 01/01/23 0351 01/01/23 1624 01/02/23 0323  AST 46*  --  221*  --   --   ALT 22  --  209*  --   --   ALKPHOS 75  --  121  --   --   BILITOT 1.0  --  1.4*  --   --    PROT 5.1*  --  4.6*  --   --   ALBUMIN 1.6*   < > 1.6* 1.7* 1.7*   < > = values in this interval not displayed.   No results for input(s): "LIPASE", "AMYLASE" in the last 168 hours. Recent Labs  Lab 12/27/22 1027  AMMONIA 12   CBC: Recent Labs  Lab 12/31/22 0411 12/31/22 1508 12/31/22 2102 01/01/23 0351 01/01/23 1624 01/02/23 0323  WBC 26.9* 27.0*  --  21.1* 23.4* 21.2*  HGB 7.9* 7.7*   < > 9.2* 8.0* 7.8*  HCT 24.6* 23.8*   < > 28.7* 24.9* 24.2*  MCV 84.5 82.1  --  84.7 83.6 82.6  PLT 175 196  --  228 158 111*   < > = values in this interval not displayed.   Cardiac Enzymes: No results for input(s): "CKTOTAL", "CKMB", "CKMBINDEX", "TROPONINI" in the last 168 hours. CBG: Recent Labs  Lab 01/01/23 1626 01/01/23 2002 01/02/23 0013 01/02/23 0326 01/02/23 0813  GLUCAP 142* 148* 157* 157* 158*    Iron Studies: No results for input(s): "IRON", "TIBC", "TRANSFERRIN", "FERRITIN" in the last 72 hours. Studies/Results: No results found.  sodium chloride   Intravenous Once   aspirin  300 mg Rectal Daily   Chlorhexidine Gluconate Cloth  6 each Topical Daily   insulin aspart  0-20 Units Subcutaneous Q4H   pantoprazole (PROTONIX) IV  40 mg Intravenous Q12H   sodium chloride flush  3 mL Intravenous Q12H    BMET    Component Value Date/Time   NA 137 01/02/2023 0323   K 3.4 (L) 01/02/2023 0323   CL 103 01/02/2023 0323   CO2 24 01/02/2023 0323   GLUCOSE 163 (H) 01/02/2023 0323   BUN 64 (H) 01/02/2023 0323   CREATININE 1.97 (H) 01/02/2023 0323   CALCIUM 7.6 (L) 01/02/2023 0323   GFRNONAA 36 (L) 01/02/2023 0323   GFRAA >60 04/14/2015 1127   CBC    Component Value Date/Time   WBC 21.2 (H) 01/02/2023 0323   RBC 2.93 (L) 01/02/2023 0323   HGB 7.8 (L) 01/02/2023 0323   HCT 24.2 (L) 01/02/2023 0323   PLT 111 (L) 01/02/2023 0323   MCV 82.6 01/02/2023 0323   MCH 26.6 01/02/2023 0323   MCHC 32.2 01/02/2023 0323   RDW 16.6 (H) 01/02/2023 0323   LYMPHSABS 0.9 10/30/2022  1654   MONOABS 0.6 10/30/2022 1654   EOSABS 0.3 10/30/2022 1654   BASOSABS 0.0 10/30/2022 1654    Assessment/Plan:   Acute kidney injury on CKD IIIb: Baseline creatinine level around 2.0.  Acute kidney injury likely ischemic ATN due to ex lap surgery/IV contrast/hypotension  Started on HD 9/18 and 9/19 due to inability to wean from vent and worsening renal function.  He was then switched to CRRT on 12/29/22.  He had been on CRRT with all fluids 4K/2.5Ca; dialysate at 1,500 mL/hr, pre- and post filter fluids at 400 mL/hr.  Goal UF 0-50 mL/hr as tolerated, however he clotted 3 systems and was taken off CRRT last night.  Discussed with PCCM and will plan a trial of IHD.  Will plan for tomorrow.  Hopefully the higher BFR will prevent clotting.  He will eventually need a TDC if his renal function does not improve.  Labs and volume stable for now.   Agree with not intervening on right RAS given that his right kidney is already much smaller than anticipated, discussed with VVS at that time. Likely not much function being contributed from right.   Given poor mental status despite being off sedation and worsening kidney function, started HD on 9/18 after discussing with patient's wife. S/p IHD #2 9/19. Transitioned to CRRT 9/20 given worsening hemodynamics and given that he received contrast (which was absolutely necessary) will c/w CRRT today especially he is getting extubated.  Strict ins and out and close lab monitoring. Acute mesenteric ischemia: Status post small bowel resection and superior mesenteric artery stent placement.  Followed by vascular surgeon and currently on agatroban since there was inadequate response to heparin. S/p OR again 9/16 for exlap and further small bowel resection, abd closed. S/p CTA 9/20 without anything emergent Vent dependent respiratory  failure: Per PCCM. RRT for clearance. Working towards extubation today  Anemia likely due to blood loss: transfuse prn for hgb <7  Shock -  pressor support and anx per primary service  Severe protein malnutrition - on  TPN Hypophosphatemia - repleted with sodium phosphate on 12/31/22.  Hold off for now that he is transitioning to IHD.   Irena Cords, MD [facility]

## 2023-01-02 NOTE — Progress Notes (Addendum)
  Progress Note    01/02/2023 8:06 AM 8 Days Post-Op  Subjective:  laying in bed, still very weak    Vitals:   01/02/23 0700 01/02/23 0800  BP: (!) 120/50   Pulse: 97 89  Resp: 20 20  Temp: 99.7 F (37.6 C) 99.5 F (37.5 C)  SpO2: 96% 93%    Physical Exam: General:  laying in bed comfortably, weak but fairly verbally responsive. NGT in place Cardiac:  regular Lungs:  nonlabored Extremities:  right DP/PT doppler signals, left DP doppler signal Abdomen:  soft, slightly distended  CBC    Component Value Date/Time   WBC 21.2 (H) 01/02/2023 0323   RBC 2.93 (L) 01/02/2023 0323   HGB 7.8 (L) 01/02/2023 0323   HCT 24.2 (L) 01/02/2023 0323   PLT 111 (L) 01/02/2023 0323   MCV 82.6 01/02/2023 0323   MCH 26.6 01/02/2023 0323   MCHC 32.2 01/02/2023 0323   RDW 16.6 (H) 01/02/2023 0323   LYMPHSABS 0.9 10/30/2022 1654   MONOABS 0.6 10/30/2022 1654   EOSABS 0.3 10/30/2022 1654   BASOSABS 0.0 10/30/2022 1654    BMET    Component Value Date/Time   NA 137 01/02/2023 0323   K 3.4 (L) 01/02/2023 0323   CL 103 01/02/2023 0323   CO2 24 01/02/2023 0323   GLUCOSE 163 (H) 01/02/2023 0323   BUN 64 (H) 01/02/2023 0323   CREATININE 1.97 (H) 01/02/2023 0323   CALCIUM 7.6 (L) 01/02/2023 0323   GFRNONAA 36 (L) 01/02/2023 0323   GFRAA >60 04/14/2015 1127    INR    Component Value Date/Time   INR 1.7 (H) 12/29/2022 0809     Intake/Output Summary (Last 24 hours) at 01/02/2023 0806 Last data filed at 01/02/2023 0700 Gross per 24 hour  Intake 3213.6 ml  Output 3915 ml  Net -701.4 ml      Assessment/Plan:  67 y.o. male is s/p: SMA stenting and small bowel resection   -No issues overnight. Tolerated CRRT yesterday. Still feeling weak this morning -Abd is soft, slightly distended. Denies any abdomina pain. Remains on TPN -Hemoglobin trending downward slightly, at 7.8 this morning. Was 9.2 yesterday morning. No recent bloody BMS or bloody output from NGT -BLE warm with right  DP/PT doppler signals, left DP doppler signal -Continue argatroban -Appreciate Nephrology, CCM, RN, and General Surgery care   Loel Dubonnet, [place] [facility] [phone] 01/02/2023 8:06 AM  VASCULAR STAFF ADDENDUM: I have independently interviewed and examined the patient. I agree with the above.  Less pain than yesterday.  Signals unchanged. Gen surgery driving PO intake  Appreciate care from all service lines and nursing    Victorino Sparrow MD [facility] Phone Number: [phone] 01/02/2023 8:30 AM

## 2023-01-03 DIAGNOSIS — K551 Chronic vascular disorders of intestine: Secondary | ICD-10-CM | POA: Diagnosis not present

## 2023-01-03 LAB — CBC
HCT: 19.9 % — ABNORMAL LOW (ref 39.0–52.0)
HCT: 26.2 % — ABNORMAL LOW (ref 39.0–52.0)
Hemoglobin: 6.4 g/dL — CL (ref 13.0–17.0)
Hemoglobin: 8.5 g/dL — ABNORMAL LOW (ref 13.0–17.0)
MCH: 26.7 pg (ref 26.0–34.0)
MCH: 26.3 pg (ref 26.0–34.0)
MCHC: 32.2 g/dL (ref 30.0–36.0)
MCHC: 32.4 g/dL (ref 30.0–36.0)
MCV: 82.9 fL (ref 80.0–100.0)
MCV: 81.1 fL (ref 80.0–100.0)
Platelets: 140 10*3/uL — ABNORMAL LOW (ref 150–400)
Platelets: 186 10*3/uL (ref 150–400)
RBC: 2.4 MIL/uL — ABNORMAL LOW (ref 4.22–5.81)
RBC: 3.23 MIL/uL — ABNORMAL LOW (ref 4.22–5.81)
RDW: 17.3 % — ABNORMAL HIGH (ref 11.5–15.5)
RDW: 17.1 % — ABNORMAL HIGH (ref 11.5–15.5)
WBC: 15.4 10*3/uL — ABNORMAL HIGH (ref 4.0–10.5)
WBC: 15.4 10*3/uL — ABNORMAL HIGH (ref 4.0–10.5)
nRBC: 0 % (ref 0.0–0.2)
nRBC: 0 % (ref 0.0–0.2)

## 2023-01-03 LAB — GLUCOSE, CAPILLARY
Glucose-Capillary: 140 mg/dL — ABNORMAL HIGH (ref 70–99)
Glucose-Capillary: 119 mg/dL — ABNORMAL HIGH (ref 70–99)
Glucose-Capillary: 146 mg/dL — ABNORMAL HIGH (ref 70–99)
Glucose-Capillary: 152 mg/dL — ABNORMAL HIGH (ref 70–99)
Glucose-Capillary: 132 mg/dL — ABNORMAL HIGH (ref 70–99)
Glucose-Capillary: 177 mg/dL — ABNORMAL HIGH (ref 70–99)

## 2023-01-03 LAB — RENAL FUNCTION PANEL
Albumin: 2 g/dL — ABNORMAL LOW (ref 3.5–5.0)
Anion gap: 11 (ref 5–15)
BUN: 100 mg/dL — ABNORMAL HIGH (ref 8–23)
CO2: 22 mmol/L (ref 22–32)
Calcium: 7.9 mg/dL — ABNORMAL LOW (ref 8.9–10.3)
Chloride: 108 mmol/L (ref 98–111)
Creatinine, Ser: 2.77 mg/dL — ABNORMAL HIGH (ref 0.61–1.24)
GFR, Estimated: 24 mL/min — ABNORMAL LOW (ref >60)
Glucose, Bld: 138 mg/dL — ABNORMAL HIGH (ref 70–99)
Phosphorus: 4.5 mg/dL (ref 2.5–4.6)
Potassium: 3.3 mmol/L — ABNORMAL LOW (ref 3.5–5.1)
Sodium: 141 mmol/L (ref 135–145)

## 2023-01-03 LAB — PREPARE RBC (CROSSMATCH)

## 2023-01-03 LAB — MAGNESIUM: Magnesium: 2.7 mg/dL — ABNORMAL HIGH (ref 1.7–2.4)

## 2023-01-03 LAB — APTT: aPTT: 62 seconds — ABNORMAL HIGH (ref 24–36)

## 2023-01-03 MED ORDER — POTASSIUM CHLORIDE 10 MEQ/50ML IV SOLN
10.0000 meq | Freq: Once | INTRAVENOUS | Status: AC
  Administered 2023-01-03: 10 meq via INTRAVENOUS
  Filled 2023-01-03: qty 50

## 2023-01-03 MED ORDER — POTASSIUM CHLORIDE 10 MEQ/50ML IV SOLN
10.0000 meq | INTRAVENOUS | Status: AC
  Administered 2023-01-03 (×2): 10 meq via INTRAVENOUS
  Filled 2023-01-03 (×2): qty 50

## 2023-01-03 MED ORDER — SODIUM CHLORIDE 0.9% IV SOLUTION: Freq: Once | INTRAVENOUS | Status: AC

## 2023-01-03 MED ORDER — HEPARIN SODIUM (PORCINE) 1000 UNIT/ML IJ SOLN
INTRAMUSCULAR | Status: AC
  Filled 2023-01-03: qty 4

## 2023-01-03 MED ORDER — TRACE MINERALS CU-MN-SE-ZN 300-55-60-3000 MCG/ML IV SOLN
INTRAVENOUS | Status: AC
  Filled 2023-01-03: qty 985.6

## 2023-01-03 NOTE — Plan of Care (Signed)

## 2023-01-03 NOTE — Progress Notes (Signed)
eLink Physician-Brief Progress Note Patient Name: [name] [name] DOB: May 22, 1955 MRN: [id number]   Date of Service  01/03/2023  HPI/Events of Note  CBC result reviewed, Hemoglobin 8.5 gm / dl.  eICU Interventions          Migdalia Dk 01/03/2023, 7:44 PM

## 2023-01-03 NOTE — Progress Notes (Signed)
PHARMACY - TOTAL PARENTERAL NUTRITION CONSULT NOTE  Indication:  SMA syndrome w associated weight loss  Patient Measurements: Height: 5\' 11"  (180.3 cm) Weight: 100 kg (220 lb 7.4 oz) IBW/kg (Calculated) : 75.3 TPN AdjBW (KG): 80.4 Body mass index is 30.75 kg/m. Usual Weight: 95 kg, has lost 50lbs recently   Assessment:  67 yo M admitted with abdominal pain, weight loss, and poor PO intake. 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 He is now s/p SMA stent placement. He was initially left is discontinuity with open abdomen and was closed on 9/16. TPN was initiated 9/14.   Glucose / Insulin: A1c 6.3% - CBGs 119-146 (unsure of 298 was real at time of TPN transition and currently controlled at 140s without need for much SSI. Dextrose amount was slightly reduced in TPN starting 9/24.  Used 14 units SSI since new TPN Electrolytes: Na 141, K dropped to 3.3, Phos 4.5, Mag up 2.7(reduced in TPN 9/21), CoCa 9.5, all trending up except K with being off CRRT.  Renal: CRRT clotted and stopped 9.24. Plan for iHD today - SCr 2.77, BUN 100 (increased).  Hepatic: LFTs increased (AST/ALT 221/209), Tbili up 1.4 (no jaundice), albumin 1.6, TG mildly elevated at 160 Intake / Output; MIVF: UOP 0.2 ml/kg/hr, NG clamped, no further signs of bleeding, LBM 9/24.   GI Imaging:  9/20 CTA: improved ostial stenosis post stenting, thrombus/occlusion of SMA/ileocolic artery improving, surgical site has increased dilatation, L lung nodule GI Surgeries / Procedures:  9/14 re-ex lap, left in discontinuity with plans for re-exploration  9/16 re-ex lap, SBR with anastomosis x2, abdominal closure Wounds (aside from abd): Deep tissue pressure injury to B/L buttocks  Central access: CVC LIJ 12/23/22 TPN start date: 12/23/22  Nutritional Goals: Goal concentrated TPN rate is 80 mL/hr (provides 148 g AA and 2320 kCal per day)  RD Estimated Needs Total Energy  Estimated Needs: 2300-2500 kcals Total Protein Estimated Needs: 140 -150 g Total Fluid Estimated Needs: >/= 2.3 L  Current Nutrition:  Clear liquids and TPN    Plan:  Continue with concentrated TPN at goal rate of 80 ml/hr at 1800 to meet 100% of needs Electrolytes in TPN: Na 150 mEq/L, K 7.8 mEq/L (~15 mEq/day), reduce Ca 5 mEq/L, remove Mg for now, reduce Phos 3.5 mmol/L, Cl:Ac 1:1.  Add standard MVI and trace elements to TPN No chromium due to RRT and on shortage Continue resistant SSI Q4H while on steroid. Watch and consider long-acting if needed.  Monitor TPN labs on Mon/Thurs >> renal function panel BID per MD.  F/U with restarting trickle feed when able  K replacement per primary team and nephrology  Link Snuffer, PharmD, BCPS, BCCCP Please refer to [facility] for [facility] Pharmacy numbers 01/03/2023, 8:58 AM

## 2023-01-03 NOTE — Progress Notes (Signed)
Clamped NG tube per order at 1105, pt started gagging and started feeling nauseous at 1240. This nurse unclamped NG tube and put it to intermittent suction per order.    Christain Sacramento, RN

## 2023-01-03 NOTE — Progress Notes (Signed)
[facility] Progress Note  9 Days Post-Op  Subjective: Having some conversation this morning.  No major complaints.   Objective: Vital signs in last 24 hours: Temp:  [99.1 F (37.3 C)-99.9 F (37.7 C)] 99.5 F (37.5 C) (09/25 0934) Pulse Rate:  [74-133] 85 (09/25 0934) Resp:  [17-28] 21 (09/25 0934) BP: (78-169)/(54-69) 158/69 (09/25 0900) SpO2:  [95 %-100 %] 99 % (09/25 0934) Weight:  [100 kg] 100 kg (09/25 0408) Last BM Date : 01/02/23  Intake/Output from previous day: 09/24 0701 - 09/25 0700 In: 3694.7 [I.V.:2539.2; IV Piggyback:1155.6] Out: 1751 [Urine:1750; Stool:1] Intake/Output this shift: Total I/O In: 103.7 [I.V.:97.6; IV Piggyback:6] Out: 300 [Urine:300]  PE: Gen:  sleepy, weak.  But follows commands.  HEENT: NG with bilious fluid Card:  RRR Pulm:  intubated, on vent Abd: mildly distended but soft, midline incision open at skin, wound is clean and dry.   Lab Results:  Recent Labs    01/02/23 0323 01/03/23 0354  WBC 21.2* 15.4*  HGB 7.8* 6.4*  HCT 24.2* 19.9*  PLT 111* 140*   BMET Recent Labs    01/02/23 0323 01/03/23 0315  NA 137 141  K 3.4* 3.3*  CL 103 108  CO2 24 22  GLUCOSE 163* 138*  BUN 64* 100*  CREATININE 1.97* 2.77*  CALCIUM 7.6* 7.9*   PT/INR No results for input(s): "LABPROT", "INR" in the last 72 hours.  CMP     Component Value Date/Time   NA 141 01/03/2023 0315   K 3.3 (L) 01/03/2023 0315   CL 108 01/03/2023 0315   CO2 22 01/03/2023 0315   GLUCOSE 138 (H) 01/03/2023 0315   BUN 100 (H) 01/03/2023 0315   CREATININE 2.77 (H) 01/03/2023 0315   CALCIUM 7.9 (L) 01/03/2023 0315   PROT 4.6 (L) 01/01/2023 0351   PROT 6.3 03/16/2020 0800   ALBUMIN 2.0 (L) 01/03/2023 0315   ALBUMIN 4.3 03/16/2020 0800   AST 221 (H) 01/01/2023 0351   ALT 209 (H) 01/01/2023 0351   ALKPHOS 121 01/01/2023 0351   BILITOT 1.4 (H) 01/01/2023 0351   BILITOT 0.8 03/16/2020 0800   GFRNONAA 24 (L) 01/03/2023 0315   GFRAA >60  04/14/2015 1127   Lipase     Component Value Date/Time   LIPASE 28 11/27/2022 0755       Assessment/Plan 67 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 Karin Lieu, exploratory laparotomy and small bowel resection Dr. Janee Morn S/p re-exploration, replace abthera FB 12/23/22   S/p re-exploration, small bowel resection with anastomosis x2, assessment of bowel perfusion with ICG, abdominal closure 9/16 Dr. Andrey Campanile - No signs of ongoing GI bleeding - Continue TPN - I think he's ready to start a diet from a GI perspective, but need to make sure he is awake enough, and can swallow.  May need speech eval.  Once he is ready, I would recommend clamping NG and try clear liquids.  If tolerating clears with NG clamped for 6 hours and no symptoms, then remove NG.    LOS: 14 days   Quentin Ore, MD     01/03/23 9:41 AM

## 2023-01-03 NOTE — Progress Notes (Addendum)
  Progress Note    01/03/2023 8:35 AM 9 Days Post-Op  Subjective:  feeling ok this morning    Vitals:   01/03/23 0600 01/03/23 0700  BP: (!) 150/64 (!) 169/68  Pulse: 87 83  Resp: 20 (!) 21  Temp: 99.5 F (37.5 C) 99.3 F (37.4 C)  SpO2: 97% 97%    Physical Exam: General:  laying in bed, NGT in place with bilious output Cardiac:  regular Lungs:  nonlabored Incisions:  midline abdominal incision dressed and dry Extremities:  right PT doppler signal, left DP doppler signal Abdomen:  soft, slightly distended  CBC    Component Value Date/Time   WBC 15.4 (H) 01/03/2023 0354   RBC 2.40 (L) 01/03/2023 0354   HGB 6.4 (LL) 01/03/2023 0354   HCT 19.9 (L) 01/03/2023 0354   PLT 140 (L) 01/03/2023 0354   MCV 82.9 01/03/2023 0354   MCH 26.7 01/03/2023 0354   MCHC 32.2 01/03/2023 0354   RDW 17.3 (H) 01/03/2023 0354   LYMPHSABS 0.9 10/30/2022 1654   MONOABS 0.6 10/30/2022 1654   EOSABS 0.3 10/30/2022 1654   BASOSABS 0.0 10/30/2022 1654    BMET    Component Value Date/Time   NA 141 01/03/2023 0315   K 3.3 (L) 01/03/2023 0315   CL 108 01/03/2023 0315   CO2 22 01/03/2023 0315   GLUCOSE 138 (H) 01/03/2023 0315   BUN 100 (H) 01/03/2023 0315   CREATININE 2.77 (H) 01/03/2023 0315   CALCIUM 7.9 (L) 01/03/2023 0315   GFRNONAA 24 (L) 01/03/2023 0315   GFRAA >60 04/14/2015 1127    INR    Component Value Date/Time   INR 1.7 (H) 12/29/2022 0809     Intake/Output Summary (Last 24 hours) at 01/03/2023 0835 Last data filed at 01/03/2023 0600 Gross per 24 hour  Intake 3337.2 ml  Output 1751 ml  Net 1586.2 ml      Assessment/Plan:  67 y.o. male is s/p: SMA stenting and small bowel resection  -Continues to feel weak. Denies any pain this morning -Abdomen is soft, slightly distended. Remains on TPN -Right PT doppler signal, left DP doppler signal -Attempted NGT clamping and clear liquid trial yesterday, however patient had nausea so NGT was unclamped. Appreciate gen  surgery input in diet -Hgb 6.4 this morning with no obvious signs of bleeding. No further bloody Bms or blood in NGT canister. Will be getting 1U PRBCs  -Continue argatroban   Loel Dubonnet, PA-C [facility] [phone] 01/03/2023 8:35 AM  VASCULAR STAFF ADDENDUM: I have independently interviewed and examined the patient. I agree with the above.  Continue argatroban.  Diet when able.  Victorino Sparrow MD [facility] Phone Number: [phone] 01/03/2023 2:52 PM

## 2023-01-03 NOTE — Progress Notes (Signed)
ANTICOAGULATION CONSULT NOTE - Follow Up  Pharmacy Consult for heparin >> argatroban Indication: SMA stenosis/occlusion  Allergies  Allergen Reactions   Zestril [Lisinopril] Cough    Patient Measurements: Height: 5\' 11"  (180.3 cm) Weight: 100 kg (220 lb 7.4 oz) IBW/kg (Calculated) : 75.3 Heparin Dosing Weight: 94.6kg  Vital Signs: Temp: 99.5 F (37.5 C) (09/25 0934) Temp Source: Rectal (09/25 0934) BP: 158/69 (09/25 0900) Pulse Rate: 85 (09/25 0934)  Labs: Recent Labs    01/01/23 0351 01/01/23 1624 01/02/23 0323 01/03/23 0315 01/03/23 0354  HGB 9.2* 8.0* 7.8*  --  6.4*  HCT 28.7* 24.9* 24.2*  --  19.9*  PLT 228 158 111*  --  140*  APTT 46*  --  62* 62*  --   CREATININE 2.07* 2.19* 1.97* 2.77*  --     Estimated Creatinine Clearance: 30.8 mL/min (A) (by C-G formula based on SCr of 2.77 mg/dL (H)).   Assessment: 74 YOM presenting with abdominal pain found to have mesenteric ischemia. Pharmacy consulted for IV heparin. No AC PTA. Pt s/p ex lap, bowel resection, thrombectomy, and angioplasty 9/12, heparin resumed postop.  Despite multiple rate increases, unable to get heparin to a detectable level, so patient was transitioned to direct thrombin inhibitor argatroban.  aPTT today is therapeutic at 62 seconds. Hgb (6.4) is low, transfused 1 unit PRBC, but no obvious signs of bleeding present. Will follow closely for bleeding. PLTs (140) stable. Per RN, no issues with the line.  Goal of Therapy:  aPTT 50-90 seconds  Monitor platelets by anticoagulation protocol: Yes   Plan:  Continue argatroban at 0.17 mcg/kg/min  Monitor daily APTT and CBC Monitor for signs/symptoms of bleeding   Romie Minus, PharmD PGY1 Pharmacy Resident  Please check AMION for all [facility] Pharmacy phone numbers After 10:00 PM, call Main Pharmacy [phone]

## 2023-01-03 NOTE — Progress Notes (Addendum)
NAME:  [name] [name], MRN:  [id number], DOB:  Nov 23, 1955, LOS: 14 ADMISSION DATE:  12/20/2022, CONSULTATION DATE: 9/12 REFERRING MD: Dr. Jacqulyn Bath, CHIEF COMPLAINT: Mesenteric ischemia  History of Present Illness:   67 year old male with past medical history as below, which is significant for intestinal stromal tumor removal in 2022, known mesenteric stenosis, GERD, hypertension, and hyperlipidemia.  In the few weeks prior to presentation on 9/11 he had been evaluated at several campuses for abdominal pain with noncontrasted CTs of the abdomen which were unrevealing.  He continued to have abdominal pain, weight loss, and poor p.o. intake.  He presented [name] on 9/11 and was admitted to the hospitalist for abdominal pain with concern for superior mesenteric artery thrombus.  He was started on heparin infusion.  Vascular surgery was consulted and ultimately a CT angiogram of the abdomen and pelvis was done 9/12 demonstrating acute occlusion of the distal SMA as well as occlusion of the inferior mesenteric artery.  He was taken to the operating room emergently and and was found to have an necrotic area of the jejunum.  This was resected, and SMA stent was placed, and the patient was left with an open abdomen.  He also remained on the mechanical ventilator postoperatively and PCCM was asked to admit for ventilator management.  Pertinent  Medical History   has a past medical history of Ankle fracture, Cancer (HCC) (1974), Cataract, GERD (gastroesophageal reflux disease), colonic polyps, Hyperlipidemia, Hypertension, Kidney stones, Patella fracture, Retinal detachment, Retinal tear of right eye, and Syncopal episodes (2008).   Significant Hospital Events: Including procedures, antibiotic start and stop dates in addition to other pertinent events   9/11 admitted with abdominal pain 9/12 CT demonstrating mesenteric occlusion, bowel resection and stent placed, to ICU on vent. Left in discontinuity  9/14  back to OR. Left in discontinuity as still concern about bowel integrity 9/16 back to OR  s sig areas of ischemia w/ 2 areas of perforation, another 66cm section of jejunum was resected and area of discontinuity was reanastomosed. Changed to argatroban post-op as unable to get heparin levels detectable  9/17 dropping RASS goal to 0 to -1, assessing for readiness to wean, serum creatinine and BUN still climbing some but making urine 9/18 iHD  9/20 rising pressor need, anemia: CTA abd: nothing emergent, transfused 9/22 extubated  Interim History / Subjective:  Remains weak.  Failed clear liquids yesterday. To be seen by SLP and try again today with clamping.  Objective   Blood pressure (!) 165/66, pulse 76, temperature 99.5 F (37.5 C), resp. rate (!) 22, height 5\' 11"  (1.803 m), weight 100 kg, SpO2 98%. CVP:  [2 mmHg-7 mmHg] 3 mmHg      Intake/Output Summary (Last 24 hours) at 01/03/2023 1129 Last data filed at 01/03/2023 1000 Gross per 24 hour  Intake 3336.52 ml  Output 2261 ml  Net 1075.52 ml   Filed Weights   01/01/23 0500 01/02/23 0300 01/03/23 0408  Weight: 99 kg 99.3 kg 100 kg    Examination: No distress Globally weak Moves to command Abd soft without signficant tenderness Oriented Lungs diminished bases due to poor inspiratory effort Did make some urine  Anemia noted, giving blood; no s/s of bleeding at present  Assessment & Plan:   Acute mesenteric ischemia: Status post small bowel resection and superior mesenteric artery stent placement 9/12.  Open abdomen, left in discontinuity since 9/12, another 66 cm of jejunum was resected on 9/16 prior to successful reanastomosis of the  small bowel now closed. Postoperative hypoxic respiratory failure - extubated 9/22, currently on room air and doing well Acute encephalopathy- resolved Hospital acquired deconditioning- biggest issue AKI on CKD3: in context of mesenteric ischemia, fluid shifts and likely ATN.  Off CRRT to see  how well kidneys recover. Hypokalemia Postop anemia- transfused 9/20;  CT abd benign 9/20. Needs another unit today. Protein calorie malnutrition : significant weight loss prior to this admission, now NPO Sacral decub/deep tissue wound. Still not at place where can be staged-WOC following Hx HTN, cataracts, GERD, HLD Reactive Afib- converted with amio; cannot do PO yet  - Continue supportive care - General surgery and vascular surgery following, appreciate the assistance - Nephrology following, seeing if had renal recovery - Replete K - PT/OT - Continue vanc/meropenem through today for ?translocation - Argatroban for [facility] - Transfuse 2 units and check evening CBC; if still dropping may need to hold argatroban - Continue TPN, NGT clamping trial and SLP eval per [facility] - Keep in unit one more day given medical complexity  Best Practice (right click and "Reselect all SmartList Selections" daily)   Diet/type: TPN DVT prophylaxis: argatroban GI prophylaxis: PPI Lines: Central line and Dialysis Catheter Foley:  Yes, and it is still needed Code Status:  full code Last date of multidisciplinary goals of care discussion [updated wife at bedside 9/22]  Myrla Halsted MD PCCM  [facility] For pager details, please see AMION or use Epic chat  After 1900, please call [facility] for cross coverage needs 01/03/2023, 11:29 AM

## 2023-01-03 NOTE — Progress Notes (Addendum)
Patient ID: JORDANN LOUGHLIN, male   DOB: 30-Jul-1955, 67 y.o.   MRN: [id number] S: No events overnight.  Feels "so so". O:BP (!) 169/68   Pulse 83   Temp 99.3 F (37.4 C)   Resp (!) 21   Ht 5\' 11"  (1.803 m)   Wt 100 kg   SpO2 97%   BMI 30.75 kg/m   Intake/Output Summary (Last 24 hours) at 01/03/2023 0901 Last data filed at 01/03/2023 0600 Gross per 24 hour  Intake 3198.37 ml  Output 1751 ml  Net 1447.37 ml   Intake/Output: I/O last 3 completed shifts: In: 50 [I.V.:3337.9; IV Piggyback:1284.1] Out: 3536 [Urine:2160; Emesis/NG output:30; Stool:1]  Intake/Output this shift:  No intake/output data recorded. Weight change: 0.7 kg Gen: ill-appearing but in NAD CVS:RRR Resp: CTA Abd: +BS, soft, NT/ND Ext: 1+ presacral edema  Recent Labs  Lab 12/28/22 0419 12/28/22 1507 12/30/22 1600 12/31/22 0411 12/31/22 1508 12/31/22 2102 01/01/23 0351 01/01/23 1624 01/02/23 0323 01/03/23 0315  NA 137   < > 137 134* 137 138 137 139 137 141  K 3.0*   < > 4.4 4.1 3.7 3.4* 3.7 3.7 3.4* 3.3*  CL 101   < > 101 99 102  --  104 105 103 108  CO2 25   < > 24 23 25   --  24 23 24 22   GLUCOSE 162*   < > 206* 236* 128*  --  230* 174* 163* 138*  BUN 104*   < > 76* 75* 74*  --  75* 69* 64* 100*  CREATININE 5.77*   < > 2.62* 2.36* 2.08*  --  2.07* 2.19* 1.97* 2.77*  ALBUMIN 1.6*   < > 1.8* 1.7* 1.7*  --  1.6* 1.7* 1.7* 2.0*  CALCIUM 8.0*   < > 8.0* 7.7* 7.6*  --  7.2* 7.0* 7.6* 7.9*  PHOS 2.9   < > 2.6 2.8 1.7*  --  2.1* 5.5* 3.5 4.5  AST 46*  --   --   --   --   --  221*  --   --   --   ALT 22  --   --   --   --   --  209*  --   --   --    < > = values in this interval not displayed.   Liver Function Tests: Recent Labs  Lab 12/28/22 0419 12/29/22 0502 01/01/23 0351 01/01/23 1624 01/02/23 0323 01/03/23 0315  AST 46*  --  221*  --   --   --   ALT 22  --  209*  --   --   --   ALKPHOS 75  --  121  --   --   --   BILITOT 1.0  --  1.4*  --   --   --   PROT 5.1*  --  4.6*  --   --   --    ALBUMIN 1.6*   < > 1.6* 1.7* 1.7* 2.0*   < > = values in this interval not displayed.   No results for input(s): "LIPASE", "AMYLASE" in the last 168 hours. Recent Labs  Lab 12/27/22 1027  AMMONIA 12   CBC: Recent Labs  Lab 12/31/22 1508 12/31/22 2102 01/01/23 0351 01/01/23 1624 01/02/23 0323 01/03/23 0354  WBC 27.0*  --  21.1* 23.4* 21.2* 15.4*  HGB 7.7*   < > 9.2* 8.0* 7.8* 6.4*  HCT 23.8*   < > 28.7* 24.9* 24.2* 19.9*  MCV 82.1  --  84.7 83.6 82.6 82.9  PLT 196  --  228 158 111* 140*   < > = values in this interval not displayed.   Cardiac Enzymes: No results for input(s): "CKTOTAL", "CKMB", "CKMBINDEX", "TROPONINI" in the last 168 hours. CBG: Recent Labs  Lab 01/02/23 1625 01/02/23 1955 01/02/23 2347 01/03/23 0312 01/03/23 0827  GLUCAP 148* 298* 119* 140* 146*    Iron Studies: No results for input(s): "IRON", "TIBC", "TRANSFERRIN", "FERRITIN" in the last 72 hours. Studies/Results: No results found.  sodium chloride   Intravenous Once   sodium chloride   Intravenous Once   aspirin  300 mg Rectal Daily   Chlorhexidine Gluconate Cloth  6 each Topical Daily   feeding supplement  1 Container Oral TID BM   heparin sodium (porcine)       insulin aspart  0-20 Units Subcutaneous Q4H   pantoprazole (PROTONIX) IV  40 mg Intravenous Q12H   sodium chloride flush  3 mL Intravenous Q12H    BMET    Component Value Date/Time   NA 141 01/03/2023 0315   K 3.3 (L) 01/03/2023 0315   CL 108 01/03/2023 0315   CO2 22 01/03/2023 0315   GLUCOSE 138 (H) 01/03/2023 0315   BUN 100 (H) 01/03/2023 0315   CREATININE 2.77 (H) 01/03/2023 0315   CALCIUM 7.9 (L) 01/03/2023 0315   GFRNONAA 24 (L) 01/03/2023 0315   GFRAA >60 04/14/2015 1127   CBC    Component Value Date/Time   WBC 15.4 (H) 01/03/2023 0354   RBC 2.40 (L) 01/03/2023 0354   HGB 6.4 (LL) 01/03/2023 0354   HCT 19.9 (L) 01/03/2023 0354   PLT 140 (L) 01/03/2023 0354   MCV 82.9 01/03/2023 0354   MCH 26.7 01/03/2023  0354   MCHC 32.2 01/03/2023 0354   RDW 17.3 (H) 01/03/2023 0354   LYMPHSABS 0.9 10/30/2022 1654   MONOABS 0.6 10/30/2022 1654   EOSABS 0.3 10/30/2022 1654   BASOSABS 0.0 10/30/2022 1654    Assessment/Plan:   Acute kidney injury on CKD IIIb: Baseline creatinine level around 2.0.  Acute kidney injury likely ischemic ATN due to ex lap surgery/IV contrast/hypotension  Started on HD 9/18 and 9/19 due to inability to wean from vent and worsening renal function.  He was then switched to CRRT on 12/29/22.  He had been on CRRT with all fluids 4K/2.5Ca; dialysate at 1,500 mL/hr, pre- and post filter fluids at 400 mL/hr.  Goal UF 0-50 mL/hr as tolerated, however he clotted 3 systems and was taken off CRRT last night.  Discussed with PCCM and will hold off on IHD for now due to improving UOP. Will follow closely and will attempt IHD if needed.  Hopefully the higher BFR will prevent clotting.  He will eventually need a TDC if his renal function does not improve.  Labs and volume stable for now.   Agree with not intervening on right RAS given that his right kidney is already much smaller than anticipated, discussed with VVS at that time. Likely not much function being contributed from right.   Given poor mental status despite being off sedation and worsening kidney function, started HD on 9/18 after discussing with patient's wife. S/p IHD #2 9/19. Transitioned to CRRT 9/20 given worsening hemodynamics and given that he received contrast (which was absolutely necessary) will c/w CRRT today especially he is getting extubated.  Strict ins and out and close lab monitoring. Acute mesenteric ischemia: Status post small bowel resection and superior  mesenteric artery stent placement.  Followed by vascular surgeon and currently on agatroban since there was inadequate response to heparin. S/p OR again 9/16 for exlap and further small bowel resection, abd closed. S/p CTA 9/20 without anything emergent Vent dependent  respiratory failure: Per PCCM. RRT for clearance. Working towards extubation today  Anemia likely due to blood loss: transfuse prn for hgb <7.  Down to 6.4 today so recommend blood transfusion today.  Shock - pressor support and anx per primary service  Severe protein malnutrition - on  TPN Hypophosphatemia - repleted with sodium phosphate on 12/31/22.  Hold off for now that he is likely transitioning to IHD.   Irena Cords, MD [facility]

## 2023-01-04 DIAGNOSIS — R5381 Other malaise: Secondary | ICD-10-CM

## 2023-01-04 DIAGNOSIS — K551 Chronic vascular disorders of intestine: Secondary | ICD-10-CM | POA: Diagnosis not present

## 2023-01-04 LAB — COMPREHENSIVE METABOLIC PANEL
ALT: 221 U/L — ABNORMAL HIGH (ref 0–44)
AST: 214 U/L — ABNORMAL HIGH (ref 15–41)
Albumin: 2 g/dL — ABNORMAL LOW (ref 3.5–5.0)
Alkaline Phosphatase: 150 U/L — ABNORMAL HIGH (ref 38–126)
Anion gap: 13 (ref 5–15)
BUN: 114 mg/dL — ABNORMAL HIGH (ref 8–23)
CO2: 22 mmol/L (ref 22–32)
Calcium: 7.9 mg/dL — ABNORMAL LOW (ref 8.9–10.3)
Chloride: 112 mmol/L — ABNORMAL HIGH (ref 98–111)
Creatinine, Ser: 2.9 mg/dL — ABNORMAL HIGH (ref 0.61–1.24)
GFR, Estimated: 23 mL/min — ABNORMAL LOW (ref >60)
Glucose, Bld: 157 mg/dL — ABNORMAL HIGH (ref 70–99)
Potassium: 3.1 mmol/L — ABNORMAL LOW (ref 3.5–5.1)
Sodium: 147 mmol/L — ABNORMAL HIGH (ref 135–145)
Total Bilirubin: 1 mg/dL (ref 0.3–1.2)
Total Protein: 5 g/dL — ABNORMAL LOW (ref 6.5–8.1)

## 2023-01-04 LAB — TYPE AND SCREEN
ABO/RH(D): A POS
Antibody Screen: NEGATIVE
Unit division: 0
Unit division: 0

## 2023-01-04 LAB — CBC
HCT: 26.8 % — ABNORMAL LOW (ref 39.0–52.0)
Hemoglobin: 9.1 g/dL — ABNORMAL LOW (ref 13.0–17.0)
MCH: 28.1 pg (ref 26.0–34.0)
MCHC: 34 g/dL (ref 30.0–36.0)
MCV: 82.7 fL (ref 80.0–100.0)
Platelets: 223 10*3/uL (ref 150–400)
RBC: 3.24 MIL/uL — ABNORMAL LOW (ref 4.22–5.81)
RDW: 17.5 % — ABNORMAL HIGH (ref 11.5–15.5)
WBC: 14.4 10*3/uL — ABNORMAL HIGH (ref 4.0–10.5)
nRBC: 0 % (ref 0.0–0.2)

## 2023-01-04 LAB — BPAM RBC
Blood Product Expiration Date: 202410162359
Blood Product Expiration Date: 202410202359
ISSUE DATE / TIME: 202409250929
ISSUE DATE / TIME: 202409251214
Unit Type and Rh: 6200
Unit Type and Rh: 6200

## 2023-01-04 LAB — APTT: aPTT: 52 seconds — ABNORMAL HIGH (ref 24–36)

## 2023-01-04 LAB — GLUCOSE, CAPILLARY
Glucose-Capillary: 129 mg/dL — ABNORMAL HIGH (ref 70–99)
Glucose-Capillary: 129 mg/dL — ABNORMAL HIGH (ref 70–99)
Glucose-Capillary: 135 mg/dL — ABNORMAL HIGH (ref 70–99)
Glucose-Capillary: 145 mg/dL — ABNORMAL HIGH (ref 70–99)
Glucose-Capillary: 143 mg/dL — ABNORMAL HIGH (ref 70–99)
Glucose-Capillary: 147 mg/dL — ABNORMAL HIGH (ref 70–99)

## 2023-01-04 LAB — PHOSPHORUS: Phosphorus: 4.2 mg/dL (ref 2.5–4.6)

## 2023-01-04 LAB — MAGNESIUM: Magnesium: 2 mg/dL (ref 1.7–2.4)

## 2023-01-04 MED ORDER — TRACE MINERALS CU-MN-SE-ZN 300-55-60-3000 MCG/ML IV SOLN
INTRAVENOUS | Status: AC
  Filled 2023-01-04: qty 985.6

## 2023-01-04 MED ORDER — POTASSIUM CHLORIDE 10 MEQ/50ML IV SOLN
10.0000 meq | INTRAVENOUS | Status: AC
  Administered 2023-01-04: 10 meq via INTRAVENOUS
  Filled 2023-01-04: qty 50

## 2023-01-04 MED ORDER — POTASSIUM CHLORIDE 10 MEQ/50ML IV SOLN
10.0000 meq | INTRAVENOUS | Status: AC
  Administered 2023-01-04 (×3): 10 meq via INTRAVENOUS
  Filled 2023-01-04 (×2): qty 50

## 2023-01-04 MED ORDER — TRACE MINERALS CU-MN-SE-ZN 300-55-60-3000 MCG/ML IV SOLN
INTRAVENOUS | Status: DC
Start: 2023-01-04 — End: 2023-01-04

## 2023-01-04 MED ORDER — FREE WATER: 300.0000 mL | Freq: Four times a day (QID) | Status: DC

## 2023-01-04 MED ORDER — POTASSIUM CHLORIDE 10 MEQ/50ML IV SOLN
10.0000 meq | Freq: Once | INTRAVENOUS | Status: AC
  Administered 2023-01-04: 10 meq via INTRAVENOUS
  Filled 2023-01-04: qty 50

## 2023-01-04 MED ORDER — HYDRALAZINE HCL 20 MG/ML IJ SOLN
20.0000 mg | INTRAMUSCULAR | Status: DC | PRN
  Administered 2023-01-04 – 2023-01-11 (×6): 20 mg via INTRAVENOUS
  Filled 2023-01-04 (×6): qty 1

## 2023-01-04 NOTE — Evaluation (Signed)
Physical Therapy Evaluation Patient Details Name: [name] [name] MRN: [id number] DOB: 1955/08/14 Today's Date: 01/04/2023  History of Present Illness  Pt is a 67 y/o male admitted 9/11 with several weeks of abdominal pain along with a 30 pound weight loss.  Work up included mesenteric ischemia and necrotic area of jejunum.  S/p Exploratory laparotomy and small bowel resection 9/12,  reopening of the laparotomy 9/14, second re-opening of laparotomy and more small bowel resection with anastomosis x2  9/16.  PMHx:testicular CA, HTN, ankle and patellar fx's, retinal detachment  Clinical Impression  Pt admitted with/for abdominal pain s/p exp lap with 2 redo's and small bowel resection x2.  Pt presently needing mod assist of 2 persons for basic mobility/transfers.  Pt unable to ambulate on evaluation.  Pt currently limited functionally due to the problems listed. ( See problems list.)   Pt will benefit from PT to maximize function and safety in order to get ready for next venue listed below.         If plan is discharge home, recommend the following: A lot of help with walking and/or transfers;A lot of help with bathing/dressing/bathroom;Assistance with cooking/housework;Assist for transportation   Can travel by private vehicle        Equipment Recommendations Rolling walker (2 wheels);BSC/3in1  Recommendations for Other Services  Rehab consult    Functional Status Assessment Patient has had a recent decline in their functional status and demonstrates the ability to make significant improvements in function in a reasonable and predictable amount of time.     Precautions / Restrictions Precautions Precautions: Fall      Mobility  Bed Mobility Overal bed mobility: Needs Assistance Bed Mobility: Rolling, Sidelying to Sit Rolling: Mod assist, +2 for safety/equipment Sidelying to sit: Mod assist, +2 for safety/equipment       General bed mobility comments: cues for technique and  initiation, truncal assist for roll and LE/truncal assist up via L elbow.    Transfers Overall transfer level: Needs assistance Equipment used: None Transfers: Sit to/from Stand, Bed to chair/wheelchair/BSC Sit to Stand: Mod assist, +2 safety/equipment Stand pivot transfers: Mod assist, +2 physical assistance         General transfer comment: cues and both forward and boost assist for standing and stand pivot.    Ambulation/Gait               General Gait Details: unable today  Stairs            Wheelchair Mobility     Tilt Bed    Modified Rankin (Stroke Patients Only)       Balance Overall balance assessment: Needs assistance Sitting-balance support: No upper extremity supported, Bilateral upper extremity supported, Feet supported Sitting balance-Leahy Scale: Fair       Standing balance-Leahy Scale: Poor Standing balance comment: reliant on external support                             Pertinent Vitals/Pain Pain Assessment Pain Assessment: Faces Pain Score: 5  Pain Location: open abdominal site Pain Descriptors / Indicators: Grimacing, Guarding, Sore Pain Intervention(s): Monitored during session, Limited activity within patient's tolerance    Home Living Family/patient expects to be discharged to:: Private residence Living Arrangements: Spouse/significant other Available Help at Discharge: Family;Available 24 hours/day Type of Home: House Home Access: Stairs to enter   [employer] of Steps: 1   Home Layout: One level Home Equipment: None (TBD, pt  groggy and giving suspect info.)      Prior Function               Mobility Comments: independent with mobility ADLs Comments: independent with ADL;s     Extremity/Trunk Assessment   Upper Extremity Assessment Upper Extremity Assessment: Defer to OT evaluation    Lower Extremity Assessment Lower Extremity Assessment: Generalized weakness        Communication      Cognition Arousal: Alert Behavior During Therapy: WFL for tasks assessed/performed Overall Cognitive Status: Difficult to assess                                          General Comments General comments (skin integrity, edema, etc.): vss    Exercises Other Exercises Other Exercises: warm up bil LE ROM prior to mobility in addition to assessing strength   Assessment/Plan    PT Assessment Patient needs continued PT services  PT Problem List Decreased strength;Decreased activity tolerance;Decreased mobility;Pain;Decreased knowledge of use of DME;Decreased knowledge of precautions       PT Treatment Interventions DME instruction;Gait training;Functional mobility training;Therapeutic activities;Balance training;Patient/family education    PT Goals (Current goals can be found in the Care Plan section)  Acute Rehab PT Goals Patient Stated Goal: get back home and indepedent PT Goal Formulation: With patient Time For Goal Achievement: 01/18/23 Potential to Achieve Goals: Good    Frequency Min 1X/week     Co-evaluation PT/OT/SLP Co-Evaluation/Treatment: Yes Reason for Co-Treatment: For patient/therapist safety;Complexity of the patient's impairments (multi-system involvement) PT goals addressed during session: Mobility/safety with mobility         AM-PAC PT "6 Clicks" Mobility  Outcome Measure Help needed turning from your back to your side while in a flat bed without using bedrails?: A Lot Help needed moving from lying on your back to sitting on the side of a flat bed without using bedrails?: A Lot Help needed moving to and from a bed to a chair (including a wheelchair)?: Total Help needed standing up from a chair using your arms (e.g., wheelchair or bedside chair)?: Total Help needed to walk in hospital room?: Total Help needed climbing 3-5 steps with a railing? : Total 6 Click Score: 8    End of Session   Activity Tolerance:  Patient tolerated treatment well Patient left: in chair;with call bell/phone within reach;with chair alarm set (lift pad placed) Nurse Communication: Mobility status PT Visit Diagnosis: Other abnormalities of gait and mobility (R26.89);Muscle weakness (generalized) (M62.81);Pain Pain - part of body:  (abdomen)    Time: 1341-1410 PT Time Calculation (min) (ACUTE ONLY): 29 min   Charges:   PT Evaluation $PT Eval Moderate Complexity: 1 Mod   PT General Charges $$ ACUTE PT VISIT: 1 Visit         01/04/2023  Jacinto Halim., PT Acute Rehabilitation Services [phone]  (office)  Eliseo Gum Novah Nessel 01/04/2023, 2:58 PM

## 2023-01-04 NOTE — Progress Notes (Signed)
Patient ID: [name] [name], male   DOB: 03-20-1956, 67 y.o.   MRN: [id number] S: UOP has significantly improved.  Had temp this am.  No new complaints. O:BP (!) 169/74   Pulse 93   Temp (!) 100.6 F (38.1 C)   Resp (!) 23   Ht 5\' 11"  (1.803 m)   Wt 98.5 kg   SpO2 97%   BMI 30.29 kg/m   Intake/Output Summary (Last 24 hours) at 01/04/2023 0851 Last data filed at 01/04/2023 0800 Gross per 24 hour  Intake 4113.55 ml  Output 3795 ml  Net 318.55 ml   Intake/Output: I/O last 3 completed shifts: In: 6105.9 [I.V.:3733.2; Blood:1236.3; IV Piggyback:1136.4] Out: 5275 [Urine:4875; Emesis/NG output:400]  Intake/Output this shift:  Total I/O In: 97.8 [I.V.:97.8] Out: 220 [Urine:220] Weight change: -1.5 kg Gen: ill-appearing, NAD CVS:RRR Resp:decreased inspiratory effort Abd: soft, NT Ext: trace presacral edema  Recent Labs  Lab 12/31/22 0411 12/31/22 1508 12/31/22 2102 01/01/23 0351 01/01/23 1624 01/02/23 0323 01/03/23 0315 01/04/23 0444  NA 134* 137 138 137 139 137 141 147*  K 4.1 3.7 3.4* 3.7 3.7 3.4* 3.3* 3.1*  CL 99 102  --  104 105 103 108 112*  CO2 23 25  --  24 23 24 22 22   GLUCOSE 236* 128*  --  230* 174* 163* 138* 157*  BUN 75* 74*  --  75* 69* 64* 100* 114*  CREATININE 2.36* 2.08*  --  2.07* 2.19* 1.97* 2.77* 2.90*  ALBUMIN 1.7* 1.7*  --  1.6* 1.7* 1.7* 2.0* 2.0*  CALCIUM 7.7* 7.6*  --  7.2* 7.0* 7.6* 7.9* 7.9*  PHOS 2.8 1.7*  --  2.1* 5.5* 3.5 4.5 4.2  AST  --   --   --  221*  --   --   --  214*  ALT  --   --   --  209*  --   --   --  221*   Liver Function Tests: Recent Labs  Lab 01/01/23 0351 01/01/23 1624 01/02/23 0323 01/03/23 0315 01/04/23 0444  AST 221*  --   --   --  214*  ALT 209*  --   --   --  221*  ALKPHOS 121  --   --   --  150*  BILITOT 1.4*  --   --   --  1.0  PROT 4.6*  --   --   --  5.0*  ALBUMIN 1.6*   < > 1.7* 2.0* 2.0*   < > = values in this interval not displayed.   No results for input(s): "LIPASE", "AMYLASE" in the last 168  hours. No results for input(s): "AMMONIA" in the last 168 hours. CBC: Recent Labs  Lab 01/01/23 1624 01/02/23 0323 01/03/23 0354 01/03/23 1847 01/04/23 0444  WBC 23.4* 21.2* 15.4* 15.4* 14.4*  HGB 8.0* 7.8* 6.4* 8.5* 9.1*  HCT 24.9* 24.2* 19.9* 26.2* 26.8*  MCV 83.6 82.6 82.9 81.1 82.7  PLT 158 111* 140* 186 223   Cardiac Enzymes: No results for input(s): "CKTOTAL", "CKMB", "CKMBINDEX", "TROPONINI" in the last 168 hours. CBG: Recent Labs  Lab 01/03/23 0827 01/03/23 1142 01/03/23 1618 01/03/23 1946 01/04/23 0753  GLUCAP 146* 152* 132* 177* 129*    Iron Studies: No results for input(s): "IRON", "TIBC", "TRANSFERRIN", "FERRITIN" in the last 72 hours. Studies/Results: No results found.  sodium chloride   Intravenous Once   aspirin  300 mg Rectal Daily   Chlorhexidine Gluconate Cloth  6 each Topical Daily  insulin aspart  0-20 Units Subcutaneous Q4H   pantoprazole (PROTONIX) IV  40 mg Intravenous Q12H   sodium chloride flush  3 mL Intravenous Q12H    BMET    Component Value Date/Time   NA 147 (H) 01/04/2023 0444   K 3.1 (L) 01/04/2023 0444   CL 112 (H) 01/04/2023 0444   CO2 22 01/04/2023 0444   GLUCOSE 157 (H) 01/04/2023 0444   BUN 114 (H) 01/04/2023 0444   CREATININE 2.90 (H) 01/04/2023 0444   CALCIUM 7.9 (L) 01/04/2023 0444   GFRNONAA 23 (L) 01/04/2023 0444   GFRAA >60 04/14/2015 1127   CBC    Component Value Date/Time   WBC 14.4 (H) 01/04/2023 0444   RBC 3.24 (L) 01/04/2023 0444   HGB 9.1 (L) 01/04/2023 0444   HCT 26.8 (L) 01/04/2023 0444   PLT 223 01/04/2023 0444   MCV 82.7 01/04/2023 0444   MCH 28.1 01/04/2023 0444   MCHC 34.0 01/04/2023 0444   RDW 17.5 (H) 01/04/2023 0444   LYMPHSABS 0.9 10/30/2022 1654   MONOABS 0.6 10/30/2022 1654   EOSABS 0.3 10/30/2022 1654   BASOSABS 0.0 10/30/2022 1654      Assessment/Plan:   Acute kidney injury on CKD IIIb: Baseline creatinine level around 2.0.  Acute kidney injury likely ischemic ATN due to ex lap  surgery/IV contrast/hypotension  Started on HD 9/18 and 9/19 due to inability to wean from vent and worsening renal function.  He was then switched to CRRT on 12/29/22.  He had been on CRRT with all fluids 4K/2.5Ca; dialysate at 1,500 mL/hr, pre- and post filter fluids at 400 mL/hr.  Goal UF 0-50 mL/hr as tolerated, however he clotted 3 systems and was taken off CRRT 01/02/23.  Discussed with PCCM and will hold off on IHD for now due to improving UOP. Will follow closely and will attempt IHD if needed for clearance only as his BUN is rising to 114.  He may eventually need a TDC if his renal function does not improve.  Labs and volume stable for now.  Hold off on HD and [facility] for now. Agree with not intervening on right RAS given that his right kidney is already much smaller than anticipated, discussed with VVS at that time. Likely not much function being contributed from right.   Given poor mental status despite being off sedation and worsening kidney function, started HD on 9/18 after discussing with patient's wife. S/p IHD #2 9/19. Transitioned to CRRT 9/20 given worsening hemodynamics and given that he received contrast (which was absolutely necessary) will c/w CRRT today especially he is getting extubated.  Strict ins and out and close lab monitoring. Acute mesenteric ischemia: Status post small bowel resection and superior mesenteric artery stent placement.  Followed by vascular surgeon and currently on agatroban since there was inadequate response to heparin. S/p OR again 9/16 for exlap and further small bowel resection, abd closed. S/p CTA 9/20 without anything emergent Vent dependent respiratory failure: Per PCCM. RRT for clearance. Working towards extubation today  Anemia likely due to blood loss: transfuse prn for hgb <7.  Down to 6.4 today so recommend blood transfusion today.  Shock - pressor support and anx per primary service  Severe protein malnutrition - on  TPN Hypernatremia - will need to add  free water via NGT.  Free water deficit ~2.3 liters. Hypophosphatemia - repleted with sodium phosphate on 12/31/22.  Hold off for now that he is off CRRT.  Irena Cords, MD [facility]

## 2023-01-04 NOTE — Progress Notes (Signed)
ANTICOAGULATION CONSULT NOTE - Follow Up  Pharmacy Consult for argatroban Indication: SMA stenosis/occlusion  Allergies  Allergen Reactions   Zestril [Lisinopril] Cough    Patient Measurements: Height: 5\' 11"  (180.3 cm) Weight: 98.5 kg (217 lb 2.5 oz) IBW/kg (Calculated) : 75.3 Heparin Dosing Weight: 94.6kg  Vital Signs: Temp: 100.6 F (38.1 C) (09/26 0800) BP: 169/74 (09/26 0800) Pulse Rate: 93 (09/26 0800)  Labs: Recent Labs    01/02/23 0323 01/03/23 0315 01/03/23 0354 01/03/23 1847 01/04/23 0444  HGB 7.8*  --  6.4* 8.5* 9.1*  HCT 24.2*  --  19.9* 26.2* 26.8*  PLT 111*  --  140* 186 223  APTT 62* 62*  --   --  52*  CREATININE 1.97* 2.77*  --   --  2.90*    Estimated Creatinine Clearance: 29.2 mL/min (A) (by C-G formula based on SCr of 2.9 mg/dL (H)).   Assessment: 69 YOM presenting with abdominal pain found to have mesenteric ischemia. Pharmacy consulted for IV heparin. No AC PTA. Pt s/p ex lap, bowel resection, thrombectomy, and angioplasty 9/12, heparin resumed postop.  Despite multiple rate increases, unable to get heparin to a detectable level, so patient was transitioned to direct thrombin inhibitor argatroban.  aPTT today is therapeutic at 52 seconds. Hgb (9.1) is improved after transfusing 2 units PRBC, no obvious signs of bleeding present. Will follow closely for bleeding. PLTs (223) stable. Per RN, no issues with the line.   Goal of Therapy:  aPTT 50-90 seconds  Monitor platelets by anticoagulation protocol: Yes  Plan:  Continue argatroban at 0.17 mcg/kg/min  Monitor daily APTT and CBC Monitor for signs/symptoms of bleeding   Romie Minus, PharmD PGY1 Pharmacy Resident  Please check AMION for all [facility] Pharmacy phone numbers After 10:00 PM, call Main Pharmacy [phone]

## 2023-01-04 NOTE — Evaluation (Signed)
Occupational Therapy Evaluation Patient Details Name: [name] [name] MRN: [id number] DOB: 06-15-1955 Today's Date: 01/04/2023   History of Present Illness Pt is a 67 y/o male admitted 9/11 with several weeks of abdominal pain along with a 30 pound weight loss.  Work up included mesenteric ischemia and necrotic area of jejunum.  S/p Exploratory laparotomy and small bowel resection 9/12,  reopening of the laparotomy 9/14, second re-opening of laparotomy and more small bowel resection with anastomosis x2  9/16.  PMHx:testicular CA, HTN, ankle and patellar fx's, retinal detachment   Clinical Impression   Patient admitted for the diagnosis above.  Prior level of function and home setup will need to be confirmed, patient very lethargic.  Currently he is needing +2 for basic mobility and Mod A to Total A for ADL completion.  OT to follow in the acute setting and Patient will benefit from intensive inpatient follow up therapy, >3 hours/day        If plan is discharge home, recommend the following: Assist for transportation;Assistance with cooking/housework;Two people to help with walking and/or transfers;A lot of help with bathing/dressing/bathroom    Functional Status Assessment  Patient has had a recent decline in their functional status and demonstrates the ability to make significant improvements in function in a reasonable and predictable amount of time.  Equipment Recommendations  None recommended by OT    Recommendations for Other Services Rehab consult     Precautions / Restrictions Precautions Precautions: Fall Restrictions Weight Bearing Restrictions: No      Mobility Bed Mobility Overal bed mobility: Needs Assistance Bed Mobility: Rolling, Sidelying to Sit Rolling: Mod assist, +2 for safety/equipment Sidelying to sit: Mod assist, +2 for safety/equipment            Transfers Overall transfer level: Needs assistance Equipment used: None Transfers: Sit to/from Stand, Bed  to chair/wheelchair/BSC Sit to Stand: Mod assist, +2 safety/equipment Stand pivot transfers: Mod assist, +2 physical assistance                Balance Overall balance assessment: Needs assistance Sitting-balance support: No upper extremity supported, Bilateral upper extremity supported, Feet supported Sitting balance-Leahy Scale: Fair     Standing balance support: Bilateral upper extremity supported Standing balance-Leahy Scale: Poor                             ADL either performed or assessed with clinical judgement   ADL Overall ADL's : Needs assistance/impaired Eating/Feeding: NPO   Grooming: Wash/dry hands;Wash/dry face;Moderate assistance;Sitting   Upper Body Bathing: Maximal assistance;Sitting   Lower Body Bathing: Total assistance;Bed level   Upper Body Dressing : Moderate assistance;Sitting;Maximal assistance   Lower Body Dressing: Total assistance;Bed level   Toilet Transfer: Moderate assistance;Stand-pivot;BSC/3in1;+2 for physical assistance   Toileting- Clothing Manipulation and Hygiene: Total assistance;Sit to/from stand               Vision Patient Visual Report: No change from baseline       Perception Perception: Not tested       Praxis Praxis: Not tested       Pertinent Vitals/Pain Pain Assessment Pain Assessment: Faces Faces Pain Scale: Hurts little more Pain Location: open abdominal site Pain Descriptors / Indicators: Grimacing, Guarding, Sore Pain Intervention(s): Monitored during session     Extremity/Trunk Assessment Upper Extremity Assessment Upper Extremity Assessment: Generalized weakness;Right hand dominant   Lower Extremity Assessment Lower Extremity Assessment: Defer to PT evaluation   Cervical /  Trunk Assessment Cervical / Trunk Assessment: Kyphotic   Communication     Cognition Arousal: Alert Behavior During Therapy: WFL for tasks assessed/performed Overall Cognitive Status: Difficult to assess                                        General Comments  vss    Exercises     Shoulder Instructions      Home Living Family/patient expects to be discharged to:: Private residence Living Arrangements: Spouse/significant other Available Help at Discharge: Family;Available 24 hours/day Type of Home: House Home Access: Stairs to enter [employer] of Steps: 1   Home Layout: One level     Bathroom Shower/Tub: Producer, television/film/video: Standard Bathroom Accessibility: Yes How Accessible: Accessible via walker Home Equipment: None          Prior Functioning/Environment               Mobility Comments: independent with mobility ADLs Comments: independent with ADL;s        OT Problem List: Decreased strength;Decreased range of motion;Decreased activity tolerance;Impaired balance (sitting and/or standing);Pain      OT Treatment/Interventions: Self-care/ADL training;Therapeutic activities;Therapeutic exercise;Patient/family education;Energy conservation;Balance training;DME and/or AE instruction    OT Goals(Current goals can be found in the care plan section) Acute Rehab OT Goals Patient Stated Goal: Return home OT Goal Formulation: With patient Time For Goal Achievement: 01/18/23 Potential to Achieve Goals: Good ADL Goals Pt Will Perform Grooming: with set-up;sitting Pt Will Perform Upper Body Bathing: with min assist;sitting Pt Will Perform Upper Body Dressing: with min assist;sitting Pt Will Transfer to Toilet: with supervision;stand pivot transfer;bedside commode Pt/caregiver will Perform Home Exercise Program: Increased ROM;Both right and left upper extremity;With Supervision  OT Frequency: Min 1X/week    Co-evaluation PT/OT/SLP Co-Evaluation/Treatment: Yes Reason for Co-Treatment: For patient/therapist safety;Complexity of the patient's impairments (multi-system involvement) PT goals addressed during session:  Mobility/safety with mobility OT goals addressed during session: ADL's and self-care      AM-PAC OT "6 Clicks" Daily Activity     Outcome Measure Help from another person eating meals?: Total Help from another person taking care of personal grooming?: A Lot Help from another person toileting, which includes using toliet, bedpan, or urinal?: A Lot Help from another person bathing (including washing, rinsing, drying)?: A Lot Help from another person to put on and taking off regular upper body clothing?: A Lot Help from another person to put on and taking off regular lower body clothing?: Total 6 Click Score: 10   End of Session Equipment Utilized During Treatment: Rolling walker (2 wheels) Nurse Communication: Mobility status  Activity Tolerance: Patient tolerated treatment well Patient left: in chair;with call bell/phone within reach;with chair alarm set  OT Visit Diagnosis: Unsteadiness on feet (R26.81);Muscle weakness (generalized) (M62.81)                Time: 1340-1410 OT Time Calculation (min): 30 min Charges:  OT General Charges $OT Visit: 1 Visit OT Evaluation $OT Eval Moderate Complexity: 1 Mod  01/04/2023  RP, OTR/L  Acute Rehabilitation Services  Office:  [phone]   Suzanna Obey 01/04/2023, 3:42 PM

## 2023-01-04 NOTE — Progress Notes (Signed)
NAME:  [name] [name], MRN:  [id number], DOB:  11-28-1955, LOS: 15 ADMISSION DATE:  12/20/2022, CONSULTATION DATE: 9/12 REFERRING MD: Dr. Jacqulyn Bath, CHIEF COMPLAINT: Mesenteric ischemia  History of Present Illness:   67 year old male with past medical history as below, which is significant for intestinal stromal tumor removal in 2022, known mesenteric stenosis, GERD, hypertension, and hyperlipidemia.  In the few weeks prior to presentation on 9/11 he had been evaluated at several campuses for abdominal pain with noncontrasted CTs of the abdomen which were unrevealing.  He continued to have abdominal pain, weight loss, and poor p.o. intake.  He presented [name] on 9/11 and was admitted to the hospitalist for abdominal pain with concern for superior mesenteric artery thrombus.  He was started on heparin infusion.  Vascular surgery was consulted and ultimately a CT angiogram of the abdomen and pelvis was done 9/12 demonstrating acute occlusion of the distal SMA as well as occlusion of the inferior mesenteric artery.  He was taken to the operating room emergently and and was found to have an necrotic area of the jejunum.  This was resected, and SMA stent was placed, and the patient was left with an open abdomen.  He also remained on the mechanical ventilator postoperatively and PCCM was asked to admit for ventilator management.  Pertinent  Medical History   has a past medical history of Ankle fracture, Cancer (HCC) (1974), Cataract, GERD (gastroesophageal reflux disease), colonic polyps, Hyperlipidemia, Hypertension, Kidney stones, Patella fracture, Retinal detachment, Retinal tear of right eye, and Syncopal episodes (2008).   Significant Hospital Events: Including procedures, antibiotic start and stop dates in addition to other pertinent events   9/11 admitted with abdominal pain 9/12 CT demonstrating mesenteric occlusion, bowel resection and stent placed, to ICU on vent. Left in discontinuity  9/14  back to OR. Left in discontinuity as still concern about bowel integrity 9/16 back to OR  s sig areas of ischemia w/ 2 areas of perforation, another 66cm section of jejunum was resected and area of discontinuity was reanastomosed. Changed to argatroban post-op as unable to get heparin levels detectable  9/17 dropping RASS goal to 0 to -1, assessing for readiness to wean, serum creatinine and BUN still climbing some but making urine 9/18 iHD  9/20 rising pressor need, anemia: CTA abd: nothing emergent, transfused 9/22 extubated 9/25 poor tolerance of NGT clamping. Abx ended  9/26 trying clamping again. Adding PRN antihypertensives. Temp incr a bit, 100.6. holding off on HD and tunneled HD cath given incr UOP   Interim History / Subjective:  Making more urine   Objective   Blood pressure (!) 169/74, pulse 93, temperature (!) 100.6 F (38.1 C), resp. rate (!) 23, height 5\' 11"  (1.803 m), weight 98.5 kg, SpO2 97%. CVP:  [2 mmHg-7 mmHg] 3 mmHg      Intake/Output Summary (Last 24 hours) at 01/04/2023 1044 Last data filed at 01/04/2023 0800 Gross per 24 hour  Intake 3861.26 ml  Output 3510 ml  Net 351.26 ml   Filed Weights   01/02/23 0300 01/03/23 0408 01/04/23 0500  Weight: 99.3 kg 100 kg 98.5 kg    Examination: Gen: chronically and acutely ill M NAD HEENT NCAT pink mm R internal jugular HD cath L internal jugular CVC  Neuro: generalized weakness. Oriented x3  Pulm: shallow breaths, no accessory use CV: slightly tachycardic cap refill < 3 sec GI: soft. Surgical sites are dressed. + flatus  GU: defer MSK: no acute joint deformity   Assessment &  Plan:   Acute mesenteric ischemia  -s/p small bowel resection -s/p SMA stent   -completed course of vanc/mero on 9/25  P -post op per CCS, VVS  -daily NGT clamping trials -- not tolerating well so far -TPN  -argatroban   Afib HTN HLD P -IV amio  -PRN IV hydral   Aki on CKD 3 Hypokalemia  P -defer HD 9/26, making some urine  now. Similarly, defer tunneled cath  -follow UOP, renal indices  -replace K   Anemia -responded well to 2 PRBC 9/25  P -follow CBC closely on argatroban   Severe protein calorie malnutrition -significant wt loss PTA, now NPO  P -TPN as above   Physical deconditioning  Deep tissue injury  -OOB to chair 9/26 -PT/OT  -WOC   Best Practice (right click and "Reselect all SmartList Selections" daily)   Diet/type: TPN DVT prophylaxis: argatroban GI prophylaxis: PPI Lines: Central line and Dialysis Catheter Foley:  Yes, and it is still needed Code Status:  full code Last date of multidisciplinary goals of care discussion [updated wife at bedside 9/22]  CCT na   Tessie Fass MSN, AGACNP-BC [facility] Pulmonary/Critical Care Medicine Amion for pager 01/04/2023, 10:44 AM

## 2023-01-04 NOTE — Progress Notes (Addendum)
  Progress Note    01/04/2023 8:12 AM 10 Days Post-Op  Subjective:  says he didn't try to drink anything yesterday    Vitals:   01/04/23 0700 01/04/23 0730  BP: (!) 189/82 (!) 179/81  Pulse: 95 94  Resp: (!) 23 (!) 23  Temp: (!) 100.6 F (38.1 C) (!) 100.6 F (38.1 C)  SpO2: 98% 97%    Physical Exam: General:  resting in bed, NGT in place with bilious output Cardiac:  regular Lungs:  nonlabored Incisions: abd incision dressed and dry Extremities:  right PT doppler signal, 2+ left DP pulse Abdomen:  soft, less distended  CBC    Component Value Date/Time   WBC 14.4 (H) 01/04/2023 0444   RBC 3.24 (L) 01/04/2023 0444   HGB 9.1 (L) 01/04/2023 0444   HCT 26.8 (L) 01/04/2023 0444   PLT 223 01/04/2023 0444   MCV 82.7 01/04/2023 0444   MCH 28.1 01/04/2023 0444   MCHC 34.0 01/04/2023 0444   RDW 17.5 (H) 01/04/2023 0444   LYMPHSABS 0.9 10/30/2022 1654   MONOABS 0.6 10/30/2022 1654   EOSABS 0.3 10/30/2022 1654   BASOSABS 0.0 10/30/2022 1654    BMET    Component Value Date/Time   NA 147 (H) 01/04/2023 0444   K 3.1 (L) 01/04/2023 0444   CL 112 (H) 01/04/2023 0444   CO2 22 01/04/2023 0444   GLUCOSE 157 (H) 01/04/2023 0444   BUN 114 (H) 01/04/2023 0444   CREATININE 2.90 (H) 01/04/2023 0444   CALCIUM 7.9 (L) 01/04/2023 0444   GFRNONAA 23 (L) 01/04/2023 0444   GFRAA >60 04/14/2015 1127    INR    Component Value Date/Time   INR 1.7 (H) 12/29/2022 0809     Intake/Output Summary (Last 24 hours) at 01/04/2023 0981 Last data filed at 01/04/2023 0700 Gross per 24 hour  Intake 4015.8 ml  Output 3575 ml  Net 440.8 ml      Assessment/Plan:  67 y.o. male is s/p: SMA stenting and small bowel resection   -No issues overnight. Reports he did not try to drink any fluids again yesterday -Denies any abdominal or lower extremity pain -Denies nausea. NGT in place with bilious output -BLE well perfused. Palpable left DP and right PT doppler signal -Continue argatroban  for SMA stent. Diet per gen surg   Loel Dubonnet, [place] [facility] [phone] 01/04/2023 8:12 AM  VASCULAR STAFF ADDENDUM: I have independently interviewed and examined the patient. I agree with the above.  Pt more verbal today. Appreciative. Most alert that I've seen.  Continue to trent labs. Liver dysfunction likely related to TPN. Please continue trending.   Victorino Sparrow MD [facility] Phone Number: [phone] 01/04/2023 3:40 PM

## 2023-01-04 NOTE — Progress Notes (Signed)
PHARMACY - TOTAL PARENTERAL NUTRITION CONSULT NOTE  Indication:  SMA syndrome w associated weight loss  Patient Measurements: Height: 5\' 11"  (180.3 cm) Weight: 98.5 kg (217 lb 2.5 oz) IBW/kg (Calculated) : 75.3 TPN AdjBW (KG): 80.4 Body mass index is 30.29 kg/m. Usual Weight: 95 kg, has lost 50lbs recently   Assessment:  67 yo M admitted with abdominal pain, weight loss, and poor PO intake. 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 He is now s/p SMA stent placement. He was initially left is discontinuity with open abdomen and was closed on 9/16. TPN was initiated 9/14.   Glucose / Insulin: A1c 6.3% - CBGs < 180 (9/24 CBG of 298 was an outlier).  Off steroid 9/22 Dextrose amount was slightly reduced in TPN starting 9/24.  Used 20 units SSI in the past 24 hrs Electrolytes: Na/CL up to 147/112, K dropped to 3.1 post 2 runs Renal: off CRRT 9/24 >> iHD if needed, none yet - SCr up 2.9, BUN up to 114  Hepatic: LFTs down slightly (AST/ALT 214/221), alk phos up to 150, tbili down to 1, albumin 2, TG mildly elevated at 160 Intake / Output; MIVF: UOP 1.5 ml/kg/hr off CRRT, NG (failed clamping trial 9/25), no further signs of bleeding, LBM 9/24.   GI Imaging:  9/20 CTA: improved ostial stenosis post stenting, thrombus/occlusion of SMA/ileocolic artery improving, surgical site has increased dilatation, L lung nodule GI Surgeries / Procedures:  9/14 re-ex lap, left in discontinuity with plans for re-exploration  9/16 re-ex lap, SBR with anastomosis x2, abdominal closure Wounds (aside from abd): Deep tissue pressure injury to B/L buttocks  Central access: CVC LIJ 12/23/22 TPN start date: 12/23/22  Nutritional Goals: Goal concentrated TPN rate is 80 mL/hr (provides 148 g AA and 2320 kCal per day)  RD Estimated Needs Total Energy Estimated Needs: 2300-2500 kcals Total Protein Estimated Needs: 140 -150 g Total Fluid Estimated  Needs: >/= 2.3 L  Current Nutrition:  Clear liquids and TPN   Plan:  Continue concentrated TPN at goal rate of 80 ml/hr to meet 100% of needs Electrolytes in TPN: reduce Na to 80 mEq/L, K 7.8 mEq/L (~15 mEq/day), reduce Ca to 5 mEq/L on 9/25, add Mg 1.5 mEq/L since 9/25, reduce Phos to 3 mmol/L on 9/25, Cl:Ac 1:1.  Add standard MVI and trace elements to TPN No chromium due to RRT and on shortage Continue resistant SSI Q4H for now  KCL x 5 runs total - hold off on increasing in TPN until needs is more clear with changing renal function Monitor TPN labs on Mon/Thurs >> daily renal function panel per MD  Monitor Na and volume status, unconcentrate TPN if needed F/u PO intake/diet advancement vs restarting TF  Frederica Chrestman D. Laney Potash, PharmD, BCPS, BCCCP  01/04/2023, 9:29 AM

## 2023-01-04 NOTE — Progress Notes (Signed)
Inpatient Rehab Admissions Coordinator Note:   Per therapy recommendations patient was screened for CIR candidacy by Stephania Fragmin, PT. At this time, pt appears to be a potential candidate for CIR. I will place an order for rehab consult for full assessment, per our protocol.  Please contact me any with questions.Estill Dooms, PT, DPT [phone] 01/04/23 5:17 PM

## 2023-01-04 NOTE — Progress Notes (Signed)
[facility] Progress Note  10 Days Post-Op  Subjective: Failed clamping trial yesterday.  Not a ton out of NG though.  Passing flatus, no bowel movement recorded   Objective: Vital signs in last 24 hours: Temp:  [99.5 F (37.5 C)-100.9 F (38.3 C)] 100.6 F (38.1 C) (09/26 0800) Pulse Rate:  [76-101] 93 (09/26 0800) Resp:  [18-26] 23 (09/26 0800) BP: (148-189)/(63-82) 169/74 (09/26 0800) SpO2:  [95 %-100 %] 97 % (09/26 0800) Weight:  [98.5 kg] 98.5 kg (09/26 0500) Last BM Date : 01/02/23  Intake/Output from previous day: 09/25 0701 - 09/26 0700 In: 4119.5 [I.V.:2388.6; Blood:1236.3; IV Piggyback:494.5] Out: 9528 [Urine:3475; Emesis/NG output:400] Intake/Output this shift: Total I/O In: 97.8 [I.V.:97.8] Out: 220 [Urine:220]  PE: Gen:  sleepy, weak.  But follows commands.  HEENT: NG with bilious fluid Card:  RRR Pulm:  intubated, on vent Abd: mildly distended but soft, midline incision open at skin, wound is clean and dry.   Lab Results:  Recent Labs    01/03/23 1847 01/04/23 0444  WBC 15.4* 14.4*  HGB 8.5* 9.1*  HCT 26.2* 26.8*  PLT 186 223   BMET Recent Labs    01/03/23 0315 01/04/23 0444  NA 141 147*  K 3.3* 3.1*  CL 108 112*  CO2 22 22  GLUCOSE 138* 157*  BUN 100* 114*  CREATININE 2.77* 2.90*  CALCIUM 7.9* 7.9*   PT/INR No results for input(s): "LABPROT", "INR" in the last 72 hours.  CMP     Component Value Date/Time   NA 147 (H) 01/04/2023 0444   K 3.1 (L) 01/04/2023 0444   CL 112 (H) 01/04/2023 0444   CO2 22 01/04/2023 0444   GLUCOSE 157 (H) 01/04/2023 0444   BUN 114 (H) 01/04/2023 0444   CREATININE 2.90 (H) 01/04/2023 0444   CALCIUM 7.9 (L) 01/04/2023 0444   PROT 5.0 (L) 01/04/2023 0444   PROT 6.3 03/16/2020 0800   ALBUMIN 2.0 (L) 01/04/2023 0444   ALBUMIN 4.3 03/16/2020 0800   AST 214 (H) 01/04/2023 0444   ALT 221 (H) 01/04/2023 0444   ALKPHOS 150 (H) 01/04/2023 0444   BILITOT 1.0 01/04/2023 0444   BILITOT 0.8  03/16/2020 0800   GFRNONAA 23 (L) 01/04/2023 0444   GFRAA >60 04/14/2015 1127   Lipase     Component Value Date/Time   LIPASE 28 11/27/2022 0755       Assessment/Plan 67 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 Karin Lieu, exploratory laparotomy and small bowel resection Dr. Janee Morn S/p re-exploration, replace abthera FB 12/23/22   S/p re-exploration, small bowel resection with anastomosis x2, assessment of bowel perfusion with ICG, abdominal closure 9/16 Dr. Andrey Campanile - No signs of ongoing GI bleeding - Continue TPN - May need speech eval.  Once he is ready, I would recommend clamping NG and try clear liquids.  If tolerating clears with NG clamped for 6 hours and no symptoms, then remove NG.  - Didn't do well with clamping trial yesterday, I would keep attempting daily to see how he feels as there really isn't much coming out of the NG tube.    LOS: 15 days   Quentin Ore, MD     01/04/23 8:40 AM

## 2023-01-05 ENCOUNTER — Other Ambulatory Visit (HOSPITAL_COMMUNITY): Payer: Self-pay

## 2023-01-05 DIAGNOSIS — I10 Essential (primary) hypertension: Secondary | ICD-10-CM | POA: Diagnosis not present

## 2023-01-05 DIAGNOSIS — K551 Chronic vascular disorders of intestine: Secondary | ICD-10-CM | POA: Diagnosis not present

## 2023-01-05 DIAGNOSIS — E43 Unspecified severe protein-calorie malnutrition: Secondary | ICD-10-CM | POA: Diagnosis not present

## 2023-01-05 DIAGNOSIS — K559 Vascular disorder of intestine, unspecified: Secondary | ICD-10-CM | POA: Diagnosis not present

## 2023-01-05 LAB — BASIC METABOLIC PANEL
Anion gap: 9 (ref 5–15)
BUN: 116 mg/dL — ABNORMAL HIGH (ref 8–23)
CO2: 24 mmol/L (ref 22–32)
Calcium: 8.1 mg/dL — ABNORMAL LOW (ref 8.9–10.3)
Chloride: 122 mmol/L — ABNORMAL HIGH (ref 98–111)
Creatinine, Ser: 2.7 mg/dL — ABNORMAL HIGH (ref 0.61–1.24)
GFR, Estimated: 25 mL/min — ABNORMAL LOW (ref >60)
Glucose, Bld: 165 mg/dL — ABNORMAL HIGH (ref 70–99)
Potassium: 3 mmol/L — ABNORMAL LOW (ref 3.5–5.1)
Sodium: 155 mmol/L — ABNORMAL HIGH (ref 135–145)

## 2023-01-05 LAB — RENAL FUNCTION PANEL
Albumin: 1.9 g/dL — ABNORMAL LOW (ref 3.5–5.0)
Anion gap: 10 (ref 5–15)
BUN: 116 mg/dL — ABNORMAL HIGH (ref 8–23)
CO2: 22 mmol/L (ref 22–32)
Calcium: 8 mg/dL — ABNORMAL LOW (ref 8.9–10.3)
Chloride: 121 mmol/L — ABNORMAL HIGH (ref 98–111)
Creatinine, Ser: 2.79 mg/dL — ABNORMAL HIGH (ref 0.61–1.24)
GFR, Estimated: 24 mL/min — ABNORMAL LOW (ref >60)
Glucose, Bld: 135 mg/dL — ABNORMAL HIGH (ref 70–99)
Phosphorus: 3.9 mg/dL (ref 2.5–4.6)
Potassium: 3 mmol/L — ABNORMAL LOW (ref 3.5–5.1)
Sodium: 153 mmol/L — ABNORMAL HIGH (ref 135–145)

## 2023-01-05 LAB — GLUCOSE, CAPILLARY
Glucose-Capillary: 126 mg/dL — ABNORMAL HIGH (ref 70–99)
Glucose-Capillary: 127 mg/dL — ABNORMAL HIGH (ref 70–99)
Glucose-Capillary: 131 mg/dL — ABNORMAL HIGH (ref 70–99)
Glucose-Capillary: 133 mg/dL — ABNORMAL HIGH (ref 70–99)
Glucose-Capillary: 140 mg/dL — ABNORMAL HIGH (ref 70–99)
Glucose-Capillary: 144 mg/dL — ABNORMAL HIGH (ref 70–99)

## 2023-01-05 LAB — CBC
HCT: 29 % — ABNORMAL LOW (ref 39.0–52.0)
Hemoglobin: 9.3 g/dL — ABNORMAL LOW (ref 13.0–17.0)
MCH: 27.6 pg (ref 26.0–34.0)
MCHC: 32.1 g/dL (ref 30.0–36.0)
MCV: 86.1 fL (ref 80.0–100.0)
Platelets: 246 10*3/uL (ref 150–400)
RBC: 3.37 MIL/uL — ABNORMAL LOW (ref 4.22–5.81)
RDW: 18.2 % — ABNORMAL HIGH (ref 11.5–15.5)
WBC: 10.2 10*3/uL (ref 4.0–10.5)
nRBC: 0 % (ref 0.0–0.2)

## 2023-01-05 LAB — MAGNESIUM: Magnesium: 1.7 mg/dL (ref 1.7–2.4)

## 2023-01-05 LAB — APTT: aPTT: 50 s — ABNORMAL HIGH (ref 24–36)

## 2023-01-05 MED ORDER — TRAVASOL 10 % IV SOLN
INTRAVENOUS | Status: AC
  Filled 2023-01-05: qty 1404

## 2023-01-05 MED ORDER — POTASSIUM CL IN DEXTROSE 5% 20 MEQ/L IV SOLN
20.0000 meq | INTRAVENOUS | Status: DC
  Administered 2023-01-05: 20 meq via INTRAVENOUS
  Filled 2023-01-05 (×2): qty 1000

## 2023-01-05 MED ORDER — POTASSIUM CHLORIDE 10 MEQ/50ML IV SOLN
10.0000 meq | INTRAVENOUS | Status: AC
  Administered 2023-01-05 (×2): 10 meq via INTRAVENOUS
  Filled 2023-01-05 (×2): qty 50

## 2023-01-05 MED ORDER — POTASSIUM CHLORIDE 10 MEQ/50ML IV SOLN
10.0000 meq | INTRAVENOUS | Status: DC
  Filled 2023-01-05 (×2): qty 50

## 2023-01-05 MED ORDER — INSULIN ASPART 100 UNIT/ML IJ SOLN
0.0000 [IU] | Freq: Four times a day (QID) | INTRAMUSCULAR | Status: DC
  Administered 2023-01-05: 3 [IU] via SUBCUTANEOUS
  Administered 2023-01-05: 4 [IU] via SUBCUTANEOUS
  Administered 2023-01-06 – 2023-01-08 (×9): 3 [IU] via SUBCUTANEOUS

## 2023-01-05 MED ORDER — POTASSIUM CL IN DEXTROSE 5% 20 MEQ/L IV SOLN
20.0000 meq | INTRAVENOUS | Status: DC
  Administered 2023-01-05 – 2023-01-07 (×5): 20 meq via INTRAVENOUS
  Filled 2023-01-05 (×6): qty 1000

## 2023-01-05 NOTE — TOC Benefit Eligibility Note (Signed)
Patient Product/process development scientist completed.    The patient is insured through [employer]. Patient has Medicare and is not eligible for a copay card, but may be able to apply for patient assistance, if available.    Ran test claim for Elquis 5 mg and Xarelto  20 mg and none of the [employer] are contracted with patient's insurance.   This test claim was processed through [facility]- copay amounts may vary at other pharmacies due to pharmacy/plan contracts, or as the patient moves through the different stages of their insurance plan.     Roland Earl, CPHT Pharmacy Technician III Certified Patient Advocate [facility] Pharmacy Patient Advocate Team Direct Number: [phone]  Fax: [phone]

## 2023-01-05 NOTE — Progress Notes (Signed)
[facility] Progress Note  11 Days Post-Op  Subjective: Tube clamped no symptoms this morning.     Objective: Vital signs in last 24 hours: Temp:  [97.8 F (36.6 C)-100.4 F (38 C)] 98.1 F (36.7 C) (09/27 0712) Pulse Rate:  [85-116] 95 (09/27 0712) Resp:  [20-28] 20 (09/27 0712) BP: (135-177)/(71-88) 161/88 (09/27 0712) SpO2:  [93 %-98 %] 98 % (09/27 0712) Last BM Date : 01/03/23  Intake/Output from previous day: 09/26 0701 - 09/27 0700 In: 1330.5 [I.V.:1162.6; IV Piggyback:168] Out: 3400 [Urine:3400] Intake/Output this shift: No intake/output data recorded.  PE: Gen:  sleepy, weak.  But follows commands.  HEENT: NG with bilious fluid Card:  RRR Pulm:  intubated, on vent Abd: mildly distended but soft, midline incision open at skin, wound is clean and dry.   Lab Results:  Recent Labs    01/03/23 1847 01/04/23 0444  WBC 15.4* 14.4*  HGB 8.5* 9.1*  HCT 26.2* 26.8*  PLT 186 223   BMET Recent Labs    01/04/23 0444 01/05/23 0515  NA 147* 153*  K 3.1* 3.0*  CL 112* 121*  CO2 22 22  GLUCOSE 157* 135*  BUN 114* 116*  CREATININE 2.90* 2.79*  CALCIUM 7.9* 8.0*   PT/INR No results for input(s): "LABPROT", "INR" in the last 72 hours.  CMP     Component Value Date/Time   NA 153 (H) 01/05/2023 0515   K 3.0 (L) 01/05/2023 0515   CL 121 (H) 01/05/2023 0515   CO2 22 01/05/2023 0515   GLUCOSE 135 (H) 01/05/2023 0515   BUN 116 (H) 01/05/2023 0515   CREATININE 2.79 (H) 01/05/2023 0515   CALCIUM 8.0 (L) 01/05/2023 0515   PROT 5.0 (L) 01/04/2023 0444   PROT 6.3 03/16/2020 0800   ALBUMIN 1.9 (L) 01/05/2023 0515   ALBUMIN 4.3 03/16/2020 0800   AST 214 (H) 01/04/2023 0444   ALT 221 (H) 01/04/2023 0444   ALKPHOS 150 (H) 01/04/2023 0444   BILITOT 1.0 01/04/2023 0444   BILITOT 0.8 03/16/2020 0800   GFRNONAA 24 (L) 01/05/2023 0515   GFRAA >60 04/14/2015 1127   Lipase     Component Value Date/Time   LIPASE 28 11/27/2022 0755        Assessment/Plan 67 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 Karin Lieu, exploratory laparotomy and small bowel resection Dr. Janee Morn S/p re-exploration, replace abthera FB 12/23/22   S/p re-exploration, small bowel resection with anastomosis x2, assessment of bowel perfusion with ICG, abdominal closure 9/16 Dr. Andrey Campanile - Continue TPN - Removed NG on exam this morning - Advance diet as tolerated per speech recommendations    LOS: 16 days   Quentin Ore, MD     01/05/23 9:51 AM

## 2023-01-05 NOTE — Evaluation (Signed)
Clinical/Bedside Swallow Evaluation Patient Details  Name: [name] [name] MRN: [id number] Date of Birth: [birth year]  Today's Date: 01/05/2023 Time: SLP Start Time (ACUTE ONLY): 1646 SLP Stop Time (ACUTE ONLY): 1713 SLP Time Calculation (min) (ACUTE ONLY): 27 min  Past Medical History:  Past Medical History:  Diagnosis Date   Ankle fracture    Cancer (HCC) 1974   testicular cancer-at age 67   Cataract    bilateral sx   GERD (gastroesophageal reflux disease)    hx of   Hx of colonic polyps    pt unsure,thinks this was done in 1982 in [place]. no reports in EPIC   Hyperlipidemia    on meds   Hypertension    on meds   Kidney stones    Patella fracture    Retinal detachment    OD   Retinal tear of right eye    Syncopal episodes 2008   Past Surgical History:  Past Surgical History:  Procedure Laterality Date   AIR/FLUID EXCHANGE Right 06/16/2014   Procedure: AIR/FLUID EXCHANGE;  Surgeon: Sherrie George, MD;  Location[facility] OR;  Service: Ophthalmology;  Laterality: Right;   ANGIOPLASTY Left 12/21/2022   Procedure: BALLOON ANGIOPLASTY SUPERIOR MESENTERIC ARTERY;  Surgeon: Victorino Sparrow, MD;  Location: [facility] OR;  Service: Vascular;  Laterality: Left;   APPLICATION OF WOUND VAC N/A 12/21/2022   Procedure: APPLICATION OF ABTHERA WOUND VAC;  Surgeon: Violeta Gelinas, MD;  Location: [facility] OR;  Service: General;  Laterality: N/A;   BIOPSY  04/04/2021   Procedure: BIOPSY;  Surgeon: Lemar Lofty., MD;  Location: [facility] ENDOSCOPY;  Service: Gastroenterology;;   BOWEL RESECTION  12/21/2022   Procedure: SMALL BOWEL RESECTION;  Surgeon: Violeta Gelinas, MD;  Location[facility] OR;  Service: General;;   BOWEL RESECTION N/A 12/25/2022   Procedure: SMALL BOWEL RESECTION W/ANASTOMOSIS X2;  Surgeon: Gaynelle Adu, MD;  Location: [facility] OR;  Service: General;  Laterality: N/A;   CATARACT EXTRACTION     CATARACT EXTRACTION  09/2014   rt eye//left 01/2015   CATARACT EXTRACTION, BILATERAL  2016   per Dr. Elmer Picker     COLONOSCOPY  02/16/2020   per Dr. Rhea Belton, adenomatous polyps, repeat in 3 yrs   ENTEROSCOPY N/A 04/04/2021   Procedure: ENTEROSCOPY;  Surgeon: Mansouraty, Netty Starring., MD;  Location: [facility] ENDOSCOPY;  Service: Gastroenterology;  Laterality: N/A;   EYE SURGERY     GAS INSERTION Right 06/16/2014   Procedure: INSERTION OF GAS;  Surgeon: Sherrie George, MD;  Location: [facility] OR;  Service: Ophthalmology;  Laterality: Right;  [id number]   GIVENS CAPSULE STUDY N/A 04/02/2021   Procedure: GIVENS CAPSULE STUDY;  Surgeon: Lemar Lofty., MD;  Location: [facility] ENDOSCOPY;  Service: Gastroenterology;  Laterality: N/A;   HYDROCELE EXCISION     INSERTION OF ILIAC STENT N/A 12/21/2022   Procedure: INSERTION OF SUPERIOR MESENTERIC ARTERY STENT;  Surgeon: Victorino Sparrow, MD;  Location: [facility] OR;  Service: Vascular;  Laterality: N/A;   IR ANGIOGRAM VISCERAL SELECTIVE  04/04/2021   IR US GUIDE VASC ACCESS LEFT  04/04/2021   KNEE ARTHROSCOPY Right    x 2   LAPAROTOMY N/A 12/21/2022   Procedure: EXPLORATION LAPAROTOMY;  Surgeon: Violeta Gelinas, MD;  Location: [facility] OR;  Service: General;  Laterality: N/A;   LAPAROTOMY N/A 12/23/2022   Procedure: RE-EXPLORATION LAPAROTOMY ABTHERA VAC EXCHANGE;  Surgeon: Almond Lint, MD;  Location: [facility] OR;  Service: General;  Laterality: N/A;   LAPAROTOMY N/A 12/25/2022   Procedure: RE-EXPLORATION LAPAROTOMY W/CLOSURE;  Surgeon: Gaynelle Adu, MD;  Location: [facility] OR;  Service: General;  Laterality: N/A;   LASER PHOTO ABLATION Right 06/16/2014   Procedure: LASER PHOTO ABLATION;  Surgeon: Sherrie George, MD;  Location: [facility] OR;  Service: Ophthalmology;  Laterality: Right;  Headscope laser and endolaser    ORCHIECTOMY     right testicle   REPAIR OF COMPLEX TRACTION RETINAL DETACHMENT Right 06/16/2014   Procedure: REPAIR OF COMPLEX TRACTION RETINAL DETACHMENT;  Surgeon: Sherrie George, MD;  Location: [facility] OR;  Service: Ophthalmology;  Laterality: Right;   RETINAL DETACHMENT SURGERY  06/2014    SUBMUCOSAL TATTOO INJECTION  04/04/2021   Procedure: SUBMUCOSAL TATTOO INJECTION;  Surgeon: Lemar Lofty., MD;  Location: [facility] ENDOSCOPY;  Service: Gastroenterology;;   TONSILLECTOMY     ULTRASOUND GUIDANCE FOR VASCULAR ACCESS Left 12/21/2022   Procedure: ULTRASOUND GUIDANCE FOR VASCULAR ACCESS FEMORAL ARTER;  Surgeon: Victorino Sparrow, MD;  Location: [facility] OR;  Service: Vascular;  Laterality: Left;   VISCERAL ANGIOGRAM Left 12/21/2022   Procedure: SUPERIOR MESENTERIC ARTERY ANGIOGRAM;  Surgeon: Victorino Sparrow, MD;  Location: [facility] OR;  Service: Vascular;  Laterality: Left;   VITRECTOMY Right 06/16/2014   WISDOM TOOTH EXTRACTION     HPI:  Pt is a 67 yo male presenting to ED from OP vascular surgery office with known mesenteric artery stenosis s/p SMA angiogram. Admitted 9/11 with several week history of abdominal pain with 30 lb weight loss. W/u included mesenteric ischemia and necrotic area of jejunum s/p exploratory laparotomy and small bowel resection 9/12, reopening of laparotomy 9/14, second reopening and small bowel resection with anastomosis x2 9/26. Intubated 9/12-9/22. PMH includes testicular cancer, HTN, retinal detachment, GERD, HTN, HLD    Assessment / Plan / Recommendation  Clinical Impression  Pt intubated for 11 days and now presents with a low vocal quality and weak cough. Currently has TPN to provide adequate nutrition. Oral motor exam WFL, although note pt with some difficulty following commands. Observed pt with trials of thin liquids and purees with observed oral holding and immediate throat clearning. Pt's cognition appears different from baseline, suspected to further impact noted signs of dysphagia. Given pt's prolonged intubation and current mentation, recommend he remain NPO except ice chips with full supervision. Suspect he may benefit from an instrumental swallow study as mentation improves. Will continue to follow to assess readiness for diet upgrade as clincially  indicated. SLP Visit Diagnosis: Dysphagia, unspecified (R13.10)    Aspiration Risk  Moderate aspiration risk    Diet Recommendation NPO;Ice chips PRN after oral care    Medication Administration: Via alternative means    Other  Recommendations Oral Care Recommendations: Oral care QID;Staff/trained caregiver to provide oral care Caregiver Recommendations: Remove water pitcher    Recommendations for follow up therapy are one component of a multi-disciplinary discharge planning process, led by the attending physician.  Recommendations may be updated based on patient status, additional functional criteria and insurance authorization.  Follow up Recommendations Acute inpatient rehab (3hours/day)      Assistance Recommended at Discharge    Functional Status Assessment Patient has had a recent decline in their functional status and demonstrates the ability to make significant improvements in function in a reasonable and predictable amount of time.  Frequency and Duration min 2x/week  2 weeks       Prognosis Prognosis for improved oropharyngeal function: Good Barriers to Reach Goals: Cognitive deficits;Time post onset      Swallow Study   General HPI: Pt is a  67 yo male presenting to ED from OP vascular surgery office with known mesenteric artery stenosis s/p SMA angiogram. Admitted 9/11 with several week history of abdominal pain with 30 lb weight loss. W/u included mesenteric ischemia and necrotic area of jejunum s/p exploratory laparotomy and small bowel resection 9/12, reopening of laparotomy 9/14, second reopening and small bowel resection with anastomosis x2 9/26. Intubated 9/12-9/22. PMH includes testicular cancer, HTN, retinal detachment, GERD, HTN, HLD Type of Study: Bedside Swallow Evaluation Previous Swallow Assessment: none in chart Diet Prior to this Study: NPO Temperature Spikes Noted: No Respiratory Status: Nasal cannula History of Recent Intubation: Yes Total duration  of intubation (days): 11 days Date extubated: 12/31/22 Behavior/Cognition: Alert;Cooperative;Requires cueing Oral Cavity Assessment: Within Functional Limits Oral Care Completed by SLP: No Oral Cavity - Dentition: Adequate natural dentition Vision: Functional for self-feeding Self-Feeding Abilities: Total assist Patient Positioning: Upright in bed Baseline Vocal Quality: Hoarse Volitional Cough: Weak Volitional Swallow: Able to elicit    Oral/Motor/Sensory Function Overall Oral Motor/Sensory Function: Within functional limits   Ice Chips Ice chips: Not tested   Thin Liquid Thin Liquid: Impaired Presentation: Straw Pharyngeal  Phase Impairments: Throat Clearing - Immediate    Nectar Thick Nectar Thick Liquid: Not tested   Honey Thick Honey Thick Liquid: Not tested   Puree Puree: Impaired Presentation: Spoon Oral Phase Impairments: Poor awareness of bolus Oral Phase Functional Implications: Oral holding   Solid     Solid: Not tested      Gwynneth Aliment, M.A., CF-SLP Speech Language Pathology, Acute Rehabilitation Services  Secure Chat preferred [phone]  01/05/2023,5:28 PM

## 2023-01-05 NOTE — Progress Notes (Addendum)
PHARMACY - TOTAL PARENTERAL NUTRITION CONSULT NOTE  Indication:  SMA syndrome w associated weight loss  Patient Measurements: Height: 5\' 11"  (180.3 cm) Weight: 98.5 kg (217 lb 2.5 oz) IBW/kg (Calculated) : 75.3 TPN AdjBW (KG): 80.4 Body mass index is 30.29 kg/m. Usual Weight: 95 kg, has lost 50lbs recently   Assessment:  67 yo M admitted with abdominal pain, weight loss, and poor PO intake. 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 He is now s/p SMA stent placement. He was initially left is discontinuity with open abdomen and was closed on 9/16. TPN was initiated 9/14.   Glucose / Insulin: A1c 6.3% - CBGs < 150 (one up to 177); used 18u SSI/24 hours Off steroids 9/22 Dextrose amount was slightly reduced in TPN starting 9/24.  Electrolytes: Na/CL up to 153/121, K dropped to 3 post 5 runs Renal: off CRRT 9/24 >> iHD if needed, none yet - SCr 2.79, BUN 116 >> stable Hepatic: 9/26: LFTs down slightly (AST/ALT 214/221), alk phos up to 150, tbili down to 1, albumin 2, TG mildly elevated at 160 Intake / Output; MIVF: UOP 1.4 ml/kg/hr off CRRT, NG - NG tube clamped 9/26 and removed 9/27, LBM 9/24. D5 + 3mEq/L @ 125/hr per nephro GI Imaging:  9/20 CTA: improved ostial stenosis post stenting, thrombus/occlusion of SMA/ileocolic artery improving, surgical site has increased dilatation, L lung nodule GI Surgeries / Procedures:  9/14 re-ex lap, left in discontinuity with plans for re-exploration  9/16 re-ex lap, SBR with anastomosis x2, abdominal closure Wounds (aside from abd): Deep tissue pressure injury to B/L buttocks  Central access: CVC LIJ 12/23/22 TPN start date: 12/23/22  Nutritional Goals: Goal concentrated TPN rate is 80 mL/hr (provides 148 g AA and 2302 kCal per day) Goal un-concentrated TPN is 100 mL/hr (provides 140g AA and 2253 kCal per day)  RD Estimated Needs Total Energy Estimated Needs: 2300-2500  kcals Total Protein Estimated Needs: 140 -150 g Total Fluid Estimated Needs: >/= 2.3 L  Current Nutrition:  Clear liquids and TPN   Plan:  Adjust to un-concentrated TPN at goal rate of 100 ml/hr to meet 100% of needs Electrolytes in TPN: Na 0 mEq/L, K 8 mEq/L (~62mEq/day), Ca 4 mEq/L, Mg 2 mEq/L, Phos 3 mmol/L, Cl:Ac 1:2.  Add standard MVI and trace elements to TPN No chromium due to RRT and on shortage Adjust resistant SSI to q6 hours - CBGs consistently between 120-140s, may be able to wean SSI further.  Monitor TPN labs on Mon/Thurs >> daily renal function panel per MD  Monitor Na and volume status, unconcentrate TPN if needed F/u ability to advance diet   Will give Kcl 10 mEq IV x2 runs  D/w Nephro - still volume overloaded but will unconcetrate TPN to allow for more free water, diurese to remove volume if needed but still needs FW (deficit ~4L). Kcl fluids running to provide ~60 mEq total over 24 hours  Rexford Maus, PharmD, BCPS 01/05/2023 8:16 AM

## 2023-01-05 NOTE — Progress Notes (Addendum)
Physical Therapy Treatment Patient Details Name: [name] [name] MRN: [id number] DOB: 1955/08/07 Today's Date: 01/05/2023   History of Present Illness Pt is a 67 y/o male admitted 9/11 with several weeks of abdominal pain along with a 30 pound weight loss.  Work up included mesenteric ischemia and necrotic area of jejunum.  S/p Exploratory laparotomy and small bowel resection 9/12,  reopening of the laparotomy 9/14, second re-opening of laparotomy and more small bowel resection with anastomosis x2  9/16.  PMHx:testicular CA, HTN, ankle and patellar fx's, retinal detachment    PT Comments  Limited session due to attempts to sit EOB limited by 3 bouts of runny stool.  Left RN/tech with 4th peri care session.     If plan is discharge home, recommend the following: A lot of help with walking and/or transfers;A lot of help with bathing/dressing/bathroom;Assistance with cooking/housework;Assist for transportation   Can travel by private vehicle        Equipment Recommendations  Rolling walker (2 wheels);BSC/3in1    Recommendations for Other Services Rehab consult     Precautions / Restrictions Precautions Precautions: Fall     Mobility  Bed Mobility Overal bed mobility: Needs Assistance Bed Mobility: Rolling, Sidelying to Sit Rolling: Mod assist Sidelying to sit: Mod assist (limited by copious stool)            Transfers                        Ambulation/Gait                   Stairs             Wheelchair Mobility     Tilt Bed    Modified Rankin (Stroke Patients Only)       Balance                                            Cognition Arousal: Alert Behavior During Therapy: WFL for tasks assessed/performed Overall Cognitive Status: Difficult to assess (likely not baseline, notably improved for eval 9/26)                                          Exercises      General Comments General comments  (skin integrity, edema, etc.): limited session due to 3 bouts of runny black diarrhea.  Assisted positioning for peri care and completed  strethes/ROM.  Unable to mobilize further due to stool.      Pertinent Vitals/Pain Pain Assessment Pain Assessment: Faces Faces Pain Scale: Hurts little more Pain Location: open abdominal site Pain Descriptors / Indicators: Grimacing, Guarding, Sore Pain Intervention(s): Monitored during session    Home Living                          Prior Function            PT Goals (current goals can now be found in the care plan section) Acute Rehab PT Goals Patient Stated Goal: get back home and indepedent PT Goal Formulation: With patient Time For Goal Achievement: 01/18/23 Potential to Achieve Goals: Good Progress towards PT goals: Not progressing toward goals - comment (too much stool to gauge function)  Frequency    Min 1X/week      PT Plan      Co-evaluation              AM-PAC PT "6 Clicks" Mobility   Outcome Measure  Help needed turning from your back to your side while in a flat bed without using bedrails?: A Lot Help needed moving from lying on your back to sitting on the side of a flat bed without using bedrails?: A Lot Help needed moving to and from a bed to a chair (including a wheelchair)?: Total Help needed standing up from a chair using your arms (e.g., wheelchair or bedside chair)?: Total Help needed to walk in hospital room?: Total Help needed climbing 3-5 steps with a railing? : Total 6 Click Score: 8    End of Session     Patient left: in bed;with call bell/phone within reach;with nursing/sitter in room Nurse Communication: Mobility status PT Visit Diagnosis: Other abnormalities of gait and mobility (R26.89);Muscle weakness (generalized) (M62.81);Pain Pain - part of body:  (abdomen)     Time: 6213-0865 PT Time Calculation (min) (ACUTE ONLY): 21 min  Charges:    $Self Care/Home Management:  8-22 PT General Charges $$ ACUTE PT VISIT: 1 Visit                     01/05/2023  Jacinto Halim., PT Acute Rehabilitation Services [phone]  (office)   Eliseo Gum Jakaya Jacobowitz 01/05/2023, 3:41 PM

## 2023-01-05 NOTE — Progress Notes (Signed)
Nutrition Follow-up  DOCUMENTATION CODES:   Severe malnutrition in context of acute illness/injury  INTERVENTION:  Continue TPN to meet 100% nutritional needs   Recommend continuing TPN until pt tolerating solid food and meeting at least 60% of estimated nutritional needs  NUTRITION DIAGNOSIS:   Severe Malnutrition related to acute illness as evidenced by energy intake < or equal to 50% for > or equal to 5 days, percent weight loss. - remains applicable  GOAL:   Patient will meet greater than or equal to 90% of their needs - goal met via TPN  MONITOR:   Diet advancement, Labs, Weight trends, Skin, I & O's (TPN)  REASON FOR ASSESSMENT:   Ventilator, Consult New TPN/TNA  ASSESSMENT:   67 yo male admitted with SMA stenosis/thrombosis and SB ischemia requiring SMA thrombectomy and stent followed by ex lap and SB resection. Pt with poor po intake and weight loss for several weeks PTA. 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, CKD 3, GERD, hx testicular cancer, HLD, HTN  9/11 Admitted 9/12 OR: acute on chronic mesenteric ischemia with necrotic area of jejunum requiring SB resection, bowel left in discontinuity, abdomen open with ABTherea wound VAC, SMA thrombectomy and stent 9/14 OR: reopening of recent laparotomy, evaluation of SMA with Doppler including mesentery of small bowel, placement of abthera device  9/16 OR: sig areas of ischemia with 2 areas of perforation, another 66 cm section of jejunum was resected and area of discontinuity was reanastomosed 9/18 iHD 9/19 2nd iHD 9/20 CRRT initiated, GI bleed 9/22 Extubated 9/24 CRRT stopped due to clotting issues overnight, CL diet, NG clamped; failed trial 9/26 NG clamped 9/27 NG removed  Pt without n/v following clamping trial. NG removed today.  Noted plans to trial liquid diet. Pending SLP evaluation.   Still volume overloaded. Transitioning to un-concentrated TPN  today.  Un-concentrated TPN at goal rate of 100 ml/hr to meet 100% of needs   Nephrology continues to hold HD. Now with good urine output.  Edema: non-pitting BUE/BLE  Medications: SSI 0-20 units q6h, protonix Drips: D5 with KCl @125ml /hr  Labs: sodium 155, potassium 3.0, BUN 116, Cr 2.70, GFR 25. CBG's 121-147 x24 hours  UOP: 3.4L x 24 hours I/O's: +69ml since 9/13  Diet Order:   Diet Order             Diet NPO time specified  Diet effective now                   EDUCATION NEEDS:   Not appropriate for education at this time  Skin:  Skin Assessment: Skin Integrity Issues: Skin Integrity Issues:: Stage II DTI: bilateral buttocks Stage II: R upper buttocks Wound Vac: n/a  Last BM:  9/27 (x2 -type 7)  Height:   Ht Readings from Last 1 Encounters:  12/20/22 5\' 11"  (1.803 m)    Weight:   Wt Readings from Last 1 Encounters:  01/04/23 98.5 kg   BMI:  Body mass index is 30.29 kg/m.  Estimated Nutritional Needs:   Kcal:  2300-2500 kcals  Protein:  140 -150 g  Fluid:  >/= 2.3 L  Drusilla Kanner, RDN, LDN Clinical Nutrition

## 2023-01-05 NOTE — Progress Notes (Signed)
Patient ID: [name] [name], male   DOB: 01/08/1956, 67 y.o.   MRN: [id number] S: Patient has a hard time relating any specific problems today.  States he does not feel very thirsty.  Sodium up to 153.  Urine output good at 2.4 L and creatinine stable at 2.8.  BUN slightly higher. O:BP (!) 161/88 (BP Location: Left Arm)   Pulse 95   Temp 98.1 F (36.7 C) (Oral)   Resp 20   Ht 5\' 11"  (1.803 m)   Wt 98.5 kg   SpO2 98%   BMI 30.29 kg/m   Intake/Output Summary (Last 24 hours) at 01/05/2023 1043 Last data filed at 01/05/2023 0500 Gross per 24 hour  Intake 969.28 ml  Output 2850 ml  Net -1880.72 ml   Intake/Output: I/O last 3 completed shifts: In: 2739.7 [I.V.:2335.1; IV Piggyback:404.6] Out: 5475 [Urine:5275; Emesis/NG output:200]  Intake/Output this shift:  No intake/output data recorded. Weight change:  Gen: ill-appearing, NAD CVS: Tachycardic, no rub Resp:decreased inspiratory effort, bilateral chest rise Abd: soft, NT Ext: Trace edema in the lower extremities present  Recent Labs  Lab 12/31/22 1508 12/31/22 2102 01/01/23 0351 01/01/23 1624 01/02/23 0323 01/03/23 0315 01/04/23 0444 01/05/23 0515  NA 137 138 137 139 137 141 147* 153*  K 3.7 3.4* 3.7 3.7 3.4* 3.3* 3.1* 3.0*  CL 102  --  104 105 103 108 112* 121*  CO2 25  --  24 23 24 22 22 22   GLUCOSE 128*  --  230* 174* 163* 138* 157* 135*  BUN 74*  --  75* 69* 64* 100* 114* 116*  CREATININE 2.08*  --  2.07* 2.19* 1.97* 2.77* 2.90* 2.79*  ALBUMIN 1.7*  --  1.6* 1.7* 1.7* 2.0* 2.0* 1.9*  CALCIUM 7.6*  --  7.2* 7.0* 7.6* 7.9* 7.9* 8.0*  PHOS 1.7*  --  2.1* 5.5* 3.5 4.5 4.2 3.9  AST  --   --  221*  --   --   --  214*  --   ALT  --   --  209*  --   --   --  221*  --    Liver Function Tests: Recent Labs  Lab 01/01/23 0351 01/01/23 1624 01/03/23 0315 01/04/23 0444 01/05/23 0515  AST 221*  --   --  214*  --   ALT 209*  --   --  221*  --   ALKPHOS 121  --   --  150*  --   BILITOT 1.4*  --   --  1.0  --   PROT 4.6*   --   --  5.0*  --   ALBUMIN 1.6*   < > 2.0* 2.0* 1.9*   < > = values in this interval not displayed.   No results for input(s): "LIPASE", "AMYLASE" in the last 168 hours. No results for input(s): "AMMONIA" in the last 168 hours. CBC: Recent Labs  Lab 01/01/23 1624 01/02/23 0323 01/03/23 0354 01/03/23 1847 01/04/23 0444  WBC 23.4* 21.2* 15.4* 15.4* 14.4*  HGB 8.0* 7.8* 6.4* 8.5* 9.1*  HCT 24.9* 24.2* 19.9* 26.2* 26.8*  MCV 83.6 82.6 82.9 81.1 82.7  PLT 158 111* 140* 186 223   Cardiac Enzymes: No results for input(s): "CKTOTAL", "CKMB", "CKMBINDEX", "TROPONINI" in the last 168 hours. CBG: Recent Labs  Lab 01/04/23 1139 01/04/23 1642 01/04/23 2003 01/05/23 0019 01/05/23 0404  GLUCAP 145* 143* 147* 133* 127*    Iron Studies: No results for input(s): "IRON", "TIBC", "TRANSFERRIN", "FERRITIN" in the  last 72 hours. Studies/Results: No results found.  sodium chloride   Intravenous Once   aspirin  300 mg Rectal Daily   Chlorhexidine Gluconate Cloth  6 each Topical Daily   insulin aspart  0-20 Units Subcutaneous Q6H   pantoprazole (PROTONIX) IV  40 mg Intravenous Q12H   sodium chloride flush  3 mL Intravenous Q12H    BMET    Component Value Date/Time   NA 153 (H) 01/05/2023 0515   K 3.0 (L) 01/05/2023 0515   CL 121 (H) 01/05/2023 0515   CO2 22 01/05/2023 0515   GLUCOSE 135 (H) 01/05/2023 0515   BUN 116 (H) 01/05/2023 0515   CREATININE 2.79 (H) 01/05/2023 0515   CALCIUM 8.0 (L) 01/05/2023 0515   GFRNONAA 24 (L) 01/05/2023 0515   GFRAA >60 04/14/2015 1127   CBC    Component Value Date/Time   WBC 14.4 (H) 01/04/2023 0444   RBC 3.24 (L) 01/04/2023 0444   HGB 9.1 (L) 01/04/2023 0444   HCT 26.8 (L) 01/04/2023 0444   PLT 223 01/04/2023 0444   MCV 82.7 01/04/2023 0444   MCH 28.1 01/04/2023 0444   MCHC 34.0 01/04/2023 0444   RDW 17.5 (H) 01/04/2023 0444   LYMPHSABS 0.9 10/30/2022 1654   MONOABS 0.6 10/30/2022 1654   EOSABS 0.3 10/30/2022 1654   BASOSABS 0.0  10/30/2022 1654      Assessment/Plan:   Acute kidney injury on CKD IIIb: Baseline creatinine level around 2.0.  Acute kidney injury likely ischemic ATN due to ex lap surgery/IV contrast/hypotension  Started on HD 9/18 and 9/19 due to inability to wean from vent and worsening renal function.  He was then switched to CRRT on 9/20-24.  Urine output now improving. Agree with not intervening on right RAS given that his right kidney is already much smaller than anticipated, discussed with VVS at that time. Likely not much function being contributed from right.   Continue holding hemodialysis Hyponatremia management as below with hydration Consider Lasix if needed for volume excess Consider removing dialysis catheter in the next couple days if he has ongoing improvement Strict ins and out and close lab monitoring. Acute mesenteric ischemia: Status post small bowel resection and superior mesenteric artery stent placement.  Followed by vascular surgeon and currently on agatroban since there was inadequate response to heparin. S/p OR again 9/16 for exlap and further small bowel resection, abd closed. S/p CTA 9/20 without anything emergent Hypernatremia: Were concentrating TPN because of some volume excess.  This is overall improved.  Has had minimal free water.  Deficit about 2.5 L.  Will initiate IV D5 water with potassium.  Pharmacy may provide to D5 water with his TPN if needed.  Advancing diet to liquids today Vent dependent respiratory failure: Now extubated.  Overall improving  Anemia likely due to blood loss: transfuse prn for hgb <7.    Shock -no longer wiring pressors.  Much improved.  Any antibiotics per primary team Severe protein malnutrition - on  TPN advancing diet

## 2023-01-05 NOTE — Progress Notes (Signed)
Inpatient Rehab Admissions Coordinator:    I met with pt. To discuss potential CIR admit. He is interested and states wife  can provide 24/7 support.  Pt. Is not yet medically stable for CIR at this time (continues NPO with TPN), but I will follow for potential admit once medically stable.  Megan Salon, MS, CCC-SLP Rehab Admissions Coordinator  [phone] (celll) [phone] (office)

## 2023-01-05 NOTE — Progress Notes (Addendum)
  Progress Note    01/05/2023 6:48 AM 11 Days Post-Op  Subjective:  awake, denies any nausea/vomiting.  Says he really hasn't had anything to eat or drink  afebrile  Vitals:   01/05/23 0100 01/05/23 0400  BP: (!) 165/78 (!) 177/85  Pulse: 87 85  Resp: (!) 23 20  Temp:  97.8 F (36.6 C)  SpO2: 98% 98%    Physical Exam: General:  no distress Lungs:  non labored Incisions:  midline incision bandaged and clean Extremities:  bilateral feet are warm and well perfused Abdomen:  soft  CBC    Component Value Date/Time   WBC 14.4 (H) 01/04/2023 0444   RBC 3.24 (L) 01/04/2023 0444   HGB 9.1 (L) 01/04/2023 0444   HCT 26.8 (L) 01/04/2023 0444   PLT 223 01/04/2023 0444   MCV 82.7 01/04/2023 0444   MCH 28.1 01/04/2023 0444   MCHC 34.0 01/04/2023 0444   RDW 17.5 (H) 01/04/2023 0444   LYMPHSABS 0.9 10/30/2022 1654   MONOABS 0.6 10/30/2022 1654   EOSABS 0.3 10/30/2022 1654   BASOSABS 0.0 10/30/2022 1654    BMET    Component Value Date/Time   NA 153 (H) 01/05/2023 0515   K 3.0 (L) 01/05/2023 0515   CL 121 (H) 01/05/2023 0515   CO2 22 01/05/2023 0515   GLUCOSE 135 (H) 01/05/2023 0515   BUN 116 (H) 01/05/2023 0515   CREATININE 2.79 (H) 01/05/2023 0515   CALCIUM 8.0 (L) 01/05/2023 0515   GFRNONAA 24 (L) 01/05/2023 0515   GFRAA >60 04/14/2015 1127    INR    Component Value Date/Time   INR 1.7 (H) 12/29/2022 0809     Intake/Output Summary (Last 24 hours) at 01/05/2023 0648 Last data filed at 01/05/2023 0058 Gross per 24 hour  Intake 1428.27 ml  Output 2600 ml  Net -1171.73 ml      Assessment/Plan:  67 y.o. male is s/p:  SMA stenting and small bowel resection   11 Days Post-Op   -bilateral feet warm and well perfused.  Abdomen soft and non tender -creatinine improved to 2.79 from 2.9 yesterday Hypernatremia-management per primary team -leukocytosis and hgb were improved on yesterday's labs. -DVT prophylaxis:  pt on argatroban for SMA stent -NGT/diet per  general surgery   Doreatha Massed, PA-C [facility] [phone] 01/05/2023 6:48 AM  VASCULAR STAFF ADDENDUM: I have independently interviewed and examined the patient. I agree with the above.  Said he had a bowel movement Much more conversational than previous.  Appears to be doing pretty well overall. Would like NGT removed and would like to have liquid if possible Can transition to DOAC today  Victorino Sparrow MD [facility] Phone Number: [phone] 01/05/2023 8:11 AM

## 2023-01-05 NOTE — Hospital Course (Addendum)
[name] [name] is a 67 y.o. male with a history of GERD, hyperlipidemia, hypertension.  Patient presented secondary to persistent abdominal pain with concern for superior mesenteric artery thrombus and resultant mesenteric ischemia.  CTA abdomen confirmed acute occlusion of the distal SMA in addition to occlusion of the inferior mesenteric artery.  Patient was taken emergently to the OR by general surgery and vascular surgery on 9/12 for exploratory laparotomy with small bowel resection in addition to percutaneous section of thrombectomy to the SMA, balloon angioplasty of the SMA and stenting of SMA.  Patient required continued intubation and ventilator support postop and was admitted to the ICU for management.  Patient required return to the operating room on 9/14 for reexploratory laparotomy and evaluation of SMA with evidence of ischemic but not frankly necrotic bowel.  Patient returned to the operating room for the third time on 9/16 for reexploratory laparotomy with small bowel resection with anastomosis x 2.  During hospitalization, patient was also managed for atrial fibrillation with amiodarone and return to sinus rhythm.  He also developed AKI and nephrology was consulted.  Patient required intermittent hemodialysis in addition to a course of CRRT while in the ICU.  Patient transferred out of the ICU on 9/27.

## 2023-01-05 NOTE — Care Management Important Message (Signed)
Important Message  Patient Details  Name: JERICK KHACHATRYAN MRN: [id number] Date of Birth: [birth year]   Important Message Given:  Yes - Medicare IM     Sherilyn Banker 01/05/2023, 3:08 PM

## 2023-01-05 NOTE — Progress Notes (Signed)
PROGRESS NOTE    [name] [name]  EXB:[id number] DOB: Aug 17, 1955 DOA: 12/20/2022 PCP: [name], MD   Brief Narrative: [name] [name] is a 67 y.o. male with a history of GERD, hyperlipidemia, hypertension.  Patient presented secondary to persistent abdominal pain with concern for superior mesenteric artery thrombus and resultant mesenteric ischemia.  CTA abdomen confirmed acute occlusion of the distal SMA in addition to occlusion of the inferior mesenteric artery.  Patient was taken emergently to the OR by general surgery and vascular surgery on 9/12 for exploratory laparotomy with small bowel resection in addition to percutaneous section of thrombectomy to the SMA, balloon angioplasty of the SMA and stenting of SMA.  Patient required continued intubation and ventilator support postop and was admitted to the ICU for management.  Patient required return to the operating room on 9/14 for reexploratory laparotomy and evaluation of SMA with evidence of ischemic but not frankly necrotic bowel.  Patient returned to the operating room for the third time on 9/16 for reexploratory laparotomy with small bowel resection with anastomosis x 2.  During hospitalization, patient was also managed for atrial fibrillation with amiodarone and return to sinus rhythm.  He also developed AKI and nephrology was consulted.  Patient required intermittent hemodialysis in addition to a course of CRRT while in the ICU.   Assessment and Plan:  Acute mesenteric ischemia Confirmed on CTA abdomen and pelvis with evidence of occlusion of the distal superior mesenteric artery and inferior mesenteric artery.  Patient required return to operating room x 3 for exploratory laparotomy, angioplasty/stent of SMA, bowel resection with anastomosis.  General surgery removed NG tube on 9/27 with plan to advance diet as able pending speech therapy recommendations. -Ongoing General Surgery recommendations  Acute respiratory failure with  hypoxia Post-operative. Patient remained intubated post surgery on 9/12 and managed in ICU via mechanical ventilation. Patient weaned off ventilator support and extubated on 9/22. Patient weaned to room air. Resolved.  Acute metabolic encephalopathy During ICU admission. Resolved.  AKI on CKD stage IIIb Baseline creatinine around 2.0. Nephrology consulted. Concern for ischemic ATN secondary to surgery, IV contrast, and hypotension. Intermittent hemodialysis started on 9/18 with transition to CRRT on 9/20 while in ICU. CRRT was discontinued on 9/24 secondary to recurrent clotting. Hemodialysis held secondary to improving urine output. -Nephrology recommendations: pending today  Hypernatremia Associated poor oral intake and renal failure. Sodium worsening. -Will defer to nephrology  Hypokalemia Potassium supplementation.  Postoperative anemia Acute blood loss anemia Hemoglobin of 13.4 on admission. Patient has required a total of 5 units of PRBC via transfusion this admission to date related to multiple surgeries. Complicated by anticoagulation use. Hemoglobin is stable.  Severe malnutrition Patient started on TPN. -Continue TPN per general surgery -Speech therapy consult for swallow evaluation; advance diet as recommended  Primary hypertension Uncontrolled. Antihypertensives held.  GERD -Continue Protonix  Hyperlipidemia Patient is on Crestor as an outpatient which is held.  Transient atrial fibrillation Patient managed on amiodarone with conversion back to sinus rhythm. Amiodarone continued with plan to transition to oral.  Pressure injury Right buttocks. Unclear if present on admission.    DVT prophylaxis: Argatroban Code Status:   Code Status: Full Code Family Communication: None at bedside Disposition Plan: Discharge likely to inpatient rehabilitation once able to wean TPN, advance PO diet and pending ongoing general surgery and nephrology  recommendations   Consultants:  PCCM General surgery Nephrology Interventional radiology  Procedures:    Antimicrobials: Zosyn Vancomycin Meropenem  Subjective: Patient reports no concerns today. States his speech is fine. He has noticed no issues with swallowing. No abdominal pain. Patient reports having a bowel movement.   Objective: BP (!) 161/88 (BP Location: Left Arm)   Pulse 95   Temp 98.1 F (36.7 C) (Oral)   Resp 20   Ht 5\' 11"  (1.803 m)   Wt 98.5 kg   SpO2 98%   BMI 30.29 kg/m   Examination:  General exam: Appears calm and comfortable Respiratory system: Clear to auscultation. Respiratory effort normal. Cardiovascular system: S1 & S2 heard, RRR. 1/6 systolic murmur Gastrointestinal system: Abdomen is nondistended, soft and nontender. No organomegaly or masses felt. Decreased bowel sounds heard. Central nervous system: Alert and oriented. Musculoskeletal: No calf tenderness Psychiatry: Judgement and insight appear normal. Blunt affect   Data Reviewed: I have personally reviewed following labs and imaging studies  CBC Lab Results  Component Value Date   WBC 14.4 (H) 01/04/2023   RBC 3.24 (L) 01/04/2023   HGB 9.1 (L) 01/04/2023   HCT 26.8 (L) 01/04/2023   MCV 82.7 01/04/2023   MCH 28.1 01/04/2023   PLT 223 01/04/2023   MCHC 34.0 01/04/2023   RDW 17.5 (H) 01/04/2023   LYMPHSABS 0.9 10/30/2022   MONOABS 0.6 10/30/2022   EOSABS 0.3 10/30/2022   BASOSABS 0.0 10/30/2022     Last metabolic panel Lab Results  Component Value Date   NA 153 (H) 01/05/2023   K 3.0 (L) 01/05/2023   CL 121 (H) 01/05/2023   CO2 22 01/05/2023   BUN 116 (H) 01/05/2023   CREATININE 2.79 (H) 01/05/2023   GLUCOSE 135 (H) 01/05/2023   GFRNONAA 24 (L) 01/05/2023   GFRAA >60 04/14/2015   CALCIUM 8.0 (L) 01/05/2023   PHOS 3.9 01/05/2023   PROT 5.0 (L) 01/04/2023   ALBUMIN 1.9 (L) 01/05/2023   BILITOT 1.0 01/04/2023   ALKPHOS 150 (H) 01/04/2023   AST 214 (H)  01/04/2023   ALT 221 (H) 01/04/2023   ANIONGAP 10 01/05/2023    GFR: Estimated Creatinine Clearance: 30.3 mL/min (A) (by C-G formula based on SCr of 2.79 mg/dL (H)).  No results found for this or any previous visit (from the past 240 hour(s)).    Radiology Studies: No results found.    LOS: 16 days    Jacquelin Hawking, MD [facility] 01/05/2023, 7:22 AM   If 7PM-7AM, please contact night-coverage [email or web address]

## 2023-01-05 NOTE — Progress Notes (Signed)
ANTICOAGULATION CONSULT NOTE - Follow Up  Pharmacy Consult for argatroban Indication: SMA stenosis/occlusion  Allergies  Allergen Reactions   Zestril [Lisinopril] Cough    Patient Measurements: Height: 5\' 11"  (180.3 cm) Weight: 98.5 kg (217 lb 2.5 oz) IBW/kg (Calculated) : 75.3 Heparin Dosing Weight: 94.6kg  Vital Signs: Temp: 97.6 F (36.4 C) (09/27 1118) Temp Source: Oral (09/27 1118) BP: 171/85 (09/27 1118) Pulse Rate: 88 (09/27 1118)  Labs: Recent Labs    01/03/23 0315 01/03/23 0354 01/03/23 1847 01/04/23 0444 01/05/23 0515 01/05/23 1253  HGB  --    < > 8.5* 9.1*  --  9.3*  HCT  --    < > 26.2* 26.8*  --  29.0*  PLT  --    < > 186 223  --  246  APTT 62*  --   --  52*  --  50*  CREATININE 2.77*  --   --  2.90* 2.79* 2.70*   < > = values in this interval not displayed.    Estimated Creatinine Clearance: 31.3 mL/min (A) (by C-G formula based on SCr of 2.7 mg/dL (H)).   Assessment: 41 YOM presenting with abdominal pain found to have mesenteric ischemia. Pharmacy consulted for IV heparin. No AC PTA. Pt s/p ex lap, bowel resection, thrombectomy, and angioplasty 9/12, heparin resumed postop.  Despite multiple rate increases, unable to get heparin to a detectable level, so patient was transitioned to direct thrombin inhibitor argatroban.  aPTT today is therapeutic at 50 seconds on Argatroban at 0.17 mcg/kg/min Hgb (9.3) is improved after transfusing 2 units PRBC. No  bleeding reproted per RN report. PLTs (246) stable..   Goal of Therapy:  aPTT 50-90 seconds  Monitor platelets by anticoagulation protocol: Yes  Plan:  Continue argatroban at 0.17 mcg/kg/min  Monitor daily APTT and CBC Monitor for signs/symptoms of bleeding   Noah Delaine, RPh Clinical Pharmacist 01/05/2023 3:42 PM   Please check AMION for all [facility] Pharmacy phone numbers After 10:00 PM, call Main Pharmacy [phone]

## 2023-01-05 NOTE — TOC Progression Note (Signed)
Transition of Care [facility]) - Progression Note    Patient Details  Name: [name] [name] MRN: [id number] Date of Birth: 1956-03-01  [facility]) CM/SW Contact  Lockie Pares, RN Phone Number: 01/05/2023, 1:03 PM  Clinical Narrative:    Patient now out of ICU and is off dialysis for now. Continues on TPN for hyponatremia. Diet advancing. CIR is reviewing as a potential candidate,  TOC will continue to follow for needs, recommendations, and transitions of care.   Expected Discharge Plan: IP Rehab Facility Barriers to Discharge: Continued Medical Work up  Expected Discharge Plan and Services In-house Referral: NA Discharge Planning Services: CM Consult Post Acute Care Choice: IP Rehab Living arrangements for the past 2 months: Single Family Home                                       Social Determinants of Health (SDOH) Interventions SDOH Screenings   Food Insecurity: No Food Insecurity (12/20/2022)  Housing: Low Risk  (12/20/2022)  Transportation Needs: No Transportation Needs (12/20/2022)  Utilities: Not At Risk (12/20/2022)  Alcohol Screen: Low Risk  (11/20/2022)  Depression (PHQ2-9): Low Risk  (11/20/2022)  Financial Resource Strain: Low Risk  (12/01/2022)  Physical Activity: Sufficiently Active (12/01/2022)  Social Connections: Moderately Integrated (12/01/2022)  Stress: No Stress Concern Present (12/01/2022)  Tobacco Use: Low Risk  (12/21/2022)  Health Literacy: Adequate Health Literacy (11/20/2022)    Readmission Risk Interventions    12/26/2022   11:40 AM  Readmission Risk Prevention Plan  Transportation Screening Complete  HRI or Home Care Consult Complete  Social Work Consult for Recovery Care Planning/Counseling Complete  Palliative Care Screening Not Applicable  Medication Review Oceanographer) Complete

## 2023-01-06 DIAGNOSIS — K551 Chronic vascular disorders of intestine: Secondary | ICD-10-CM | POA: Diagnosis not present

## 2023-01-06 LAB — BASIC METABOLIC PANEL
Anion gap: 9 (ref 5–15)
Anion gap: 14 (ref 5–15)
BUN: 104 mg/dL — ABNORMAL HIGH (ref 8–23)
BUN: 100 mg/dL — ABNORMAL HIGH (ref 8–23)
CO2: 20 mmol/L — ABNORMAL LOW (ref 22–32)
CO2: 17 mmol/L — ABNORMAL LOW (ref 22–32)
Calcium: 7.8 mg/dL — ABNORMAL LOW (ref 8.9–10.3)
Calcium: 7.9 mg/dL — ABNORMAL LOW (ref 8.9–10.3)
Chloride: 120 mmol/L — ABNORMAL HIGH (ref 98–111)
Chloride: 116 mmol/L — ABNORMAL HIGH (ref 98–111)
Creatinine, Ser: 2.44 mg/dL — ABNORMAL HIGH (ref 0.61–1.24)
Creatinine, Ser: 2.3 mg/dL — ABNORMAL HIGH (ref 0.61–1.24)
GFR, Estimated: 28 mL/min — ABNORMAL LOW (ref >60)
GFR, Estimated: 30 mL/min — ABNORMAL LOW (ref >60)
Glucose, Bld: 171 mg/dL — ABNORMAL HIGH (ref 70–99)
Glucose, Bld: 188 mg/dL — ABNORMAL HIGH (ref 70–99)
Potassium: 3.3 mmol/L — ABNORMAL LOW (ref 3.5–5.1)
Potassium: 4 mmol/L (ref 3.5–5.1)
Sodium: 149 mmol/L — ABNORMAL HIGH (ref 135–145)
Sodium: 147 mmol/L — ABNORMAL HIGH (ref 135–145)

## 2023-01-06 LAB — PHOSPHORUS: Phosphorus: 3.4 mg/dL (ref 2.5–4.6)

## 2023-01-06 LAB — CBC
HCT: 28 % — ABNORMAL LOW (ref 39.0–52.0)
Hemoglobin: 8.9 g/dL — ABNORMAL LOW (ref 13.0–17.0)
MCH: 27.1 pg (ref 26.0–34.0)
MCHC: 31.8 g/dL (ref 30.0–36.0)
MCV: 85.1 fL (ref 80.0–100.0)
Platelets: 245 10*3/uL (ref 150–400)
RBC: 3.29 MIL/uL — ABNORMAL LOW (ref 4.22–5.81)
RDW: 18.2 % — ABNORMAL HIGH (ref 11.5–15.5)
WBC: 9.7 10*3/uL (ref 4.0–10.5)
nRBC: 0 % (ref 0.0–0.2)

## 2023-01-06 LAB — GLUCOSE, CAPILLARY
Glucose-Capillary: 123 mg/dL — ABNORMAL HIGH (ref 70–99)
Glucose-Capillary: 126 mg/dL — ABNORMAL HIGH (ref 70–99)
Glucose-Capillary: 141 mg/dL — ABNORMAL HIGH (ref 70–99)
Glucose-Capillary: 143 mg/dL — ABNORMAL HIGH (ref 70–99)
Glucose-Capillary: 150 mg/dL — ABNORMAL HIGH (ref 70–99)

## 2023-01-06 LAB — HEMOGLOBIN AND HEMATOCRIT, BLOOD
HCT: 29.2 % — ABNORMAL LOW (ref 39.0–52.0)
HCT: 28 % — ABNORMAL LOW (ref 39.0–52.0)
Hemoglobin: 9.1 g/dL — ABNORMAL LOW (ref 13.0–17.0)
Hemoglobin: 8.9 g/dL — ABNORMAL LOW (ref 13.0–17.0)

## 2023-01-06 LAB — LACTIC ACID, PLASMA: Lactic Acid, Venous: 0.9 mmol/L (ref 0.5–1.9)

## 2023-01-06 LAB — APTT: aPTT: 50 s — ABNORMAL HIGH (ref 24–36)

## 2023-01-06 LAB — MAGNESIUM: Magnesium: 1.5 mg/dL — ABNORMAL LOW (ref 1.7–2.4)

## 2023-01-06 LAB — OCCULT BLOOD X 1 CARD TO LAB, STOOL: Fecal Occult Bld: POSITIVE — AB

## 2023-01-06 MED ORDER — POTASSIUM CHLORIDE 10 MEQ/50ML IV SOLN
10.0000 meq | INTRAVENOUS | Status: AC
  Administered 2023-01-06 (×4): 10 meq via INTRAVENOUS
  Filled 2023-01-06 (×4): qty 50

## 2023-01-06 MED ORDER — TRAVASOL 10 % IV SOLN
INTRAVENOUS | Status: AC
  Filled 2023-01-06: qty 1404

## 2023-01-06 MED ORDER — MAGNESIUM SULFATE 4 GM/100ML IV SOLN
4.0000 g | Freq: Once | INTRAVENOUS | Status: AC
  Administered 2023-01-06: 4 g via INTRAVENOUS
  Filled 2023-01-06: qty 100

## 2023-01-06 NOTE — Progress Notes (Signed)
This nurse received report of patient having dark stools overnight, this nurse observed one dark stool this morning. Dr. Caleb Popp and Dr. Fredricka Bonine made aware. Fecal occult was collected and resulted positive. Approximately 1600, this nurse and Makiya, NT entered patient after he reported having a BM to find patient had incontinent episode of moderate bright red stool with small blood clots. Dr. Caleb Popp and Dr. Hillery Hunter made aware, new ordered received. BP remains stable. Wife at bedside, call bell within reach.

## 2023-01-06 NOTE — Progress Notes (Signed)
PROGRESS NOTE    [name] HAMPE  [name]:[id number] DOB: 1955-04-26 DOA: 12/20/2022 PCP: [name], MD   Brief Narrative: [name] [name] is a 67 y.o. male with a history of GERD, hyperlipidemia, hypertension.  Patient presented secondary to persistent abdominal pain with concern for superior mesenteric artery thrombus and resultant mesenteric ischemia.  CTA abdomen confirmed acute occlusion of the distal SMA in addition to occlusion of the inferior mesenteric artery.  Patient was taken emergently to the OR by general surgery and vascular surgery on 9/12 for exploratory laparotomy with small bowel resection in addition to percutaneous section of thrombectomy to the SMA, balloon angioplasty of the SMA and stenting of SMA.  Patient required continued intubation and ventilator support postop and was admitted to the ICU for management.  Patient required return to the operating room on 9/14 for reexploratory laparotomy and evaluation of SMA with evidence of ischemic but not frankly necrotic bowel.  Patient returned to the operating room for the third time on 9/16 for reexploratory laparotomy with small bowel resection with anastomosis x 2.  During hospitalization, patient was also managed for atrial fibrillation with amiodarone and return to sinus rhythm.  He also developed AKI and nephrology was consulted.  Patient required intermittent hemodialysis in addition to a course of CRRT while in the ICU.  Patient transferred out of the ICU on 9/27.   Assessment and Plan:  Acute mesenteric ischemia Confirmed on CTA abdomen and pelvis with evidence of occlusion of the distal superior mesenteric artery and inferior mesenteric artery.  Patient required return to operating room x 3 for exploratory laparotomy, angioplasty/stent of SMA, bowel resection with anastomosis.  General surgery removed NG tube on 9/27 with plan to advance diet as able pending speech therapy recommendations. -Ongoing General Surgery  recommendations: advance diet as tolerated/per SLP  Acute respiratory failure with hypoxia Post-operative. Patient remained intubated post surgery on 9/12 and managed in ICU via mechanical ventilation. Patient weaned off ventilator support and extubated on 9/22. Patient weaned to room air. Resolved.  Acute metabolic encephalopathy During ICU admission. Resolved.  AKI on CKD stage IIIb Baseline creatinine around 2.0. Nephrology consulted. Concern for ischemic ATN secondary to surgery, IV contrast, and hypotension. Intermittent hemodialysis started on 9/18 with transition to CRRT on 9/20 while in ICU. CRRT was discontinued on 9/24 secondary to recurrent clotting. Hemodialysis held secondary to improving urine output. -Nephrology recommendations: pending today  Hypernatremia Associated poor oral intake and renal failure. Sodium improving with initiation of D5 water IV fluids. -Nephrology recommendations: D5 water IV  Black stools Concern for melena. Complicated by recent abdominal surgeries and argatroban use. Patient reports a history of GI bleed in the past. -FOBT -Trend hemoglobin  Hypokalemia Potassium supplementation per pharmacy while on TPN  Hypomagnesemia Magnesium supplementation per pharmacy while on TPN  Postoperative anemia Acute blood loss anemia Hemoglobin of 13.4 on admission. Patient has required a total of 5 units of PRBC via transfusion this admission to date related to multiple surgeries. Complicated by anticoagulation use. Hemoglobin is stable.  Severe malnutrition Patient started on TPN. SLP evaluated on 9/27 with recommendation for NPO with ice chips only. -Continue TPN per general surgery -Ongoing SLP eval/recommendations  Primary hypertension Uncontrolled. Antihypertensives held.  GERD -Continue Protonix  Hyperlipidemia Patient is on Crestor as an outpatient which is held.  Transient atrial fibrillation Patient managed on amiodarone with conversion  back to sinus rhythm. Amiodarone continued with plan to transition to oral.  Pressure injury Right buttocks. Unclear  if present on admission.    DVT prophylaxis: Argatroban Code Status:   Code Status: Full Code Family Communication: None at bedside Disposition Plan: Discharge likely to inpatient rehabilitation once able to wean TPN, advance PO diet and pending ongoing general surgery and nephrology recommendations   Consultants:  PCCM General surgery Nephrology Interventional radiology  Procedures:    Antimicrobials: Zosyn Vancomycin Meropenem    Subjective: No concerns this morning.  Objective: BP (!) 156/83 (BP Location: Left Arm)   Pulse 90   Temp (!) 97.4 F (36.3 C) (Oral)   Resp 20   Ht 5\' 11"  (1.803 m)   Wt 99.5 kg   SpO2 98%   BMI 30.59 kg/m   Examination:  General exam: Appears calm and comfortable Respiratory system: Clear to auscultation. Respiratory effort normal. Cardiovascular system: S1 & S2 heard, RRR. Gastrointestinal system: Abdomen is distended, soft and non-tender. Decreased bowel sounds heard. Central nervous system: Alert and oriented. No focal neurological deficits. Psychiatry: Judgement and insight appear normal. Mood & affect appropriate.    Data Reviewed: I have personally reviewed following labs and imaging studies  CBC Lab Results  Component Value Date   WBC 9.7 01/06/2023   RBC 3.29 (L) 01/06/2023   HGB 8.9 (L) 01/06/2023   HCT 28.0 (L) 01/06/2023   MCV 85.1 01/06/2023   MCH 27.1 01/06/2023   PLT 245 01/06/2023   MCHC 31.8 01/06/2023   RDW 18.2 (H) 01/06/2023   LYMPHSABS 0.9 10/30/2022   MONOABS 0.6 10/30/2022   EOSABS 0.3 10/30/2022   BASOSABS 0.0 10/30/2022     Last metabolic panel Lab Results  Component Value Date   NA 149 (H) 01/06/2023   K 3.3 (L) 01/06/2023   CL 120 (H) 01/06/2023   CO2 20 (L) 01/06/2023   BUN 104 (H) 01/06/2023   CREATININE 2.44 (H) 01/06/2023   GLUCOSE 171 (H) 01/06/2023   GFRNONAA  28 (L) 01/06/2023   GFRAA >60 04/14/2015   CALCIUM 7.8 (L) 01/06/2023   PHOS 3.4 01/06/2023   PROT 5.0 (L) 01/04/2023   ALBUMIN 1.9 (L) 01/05/2023   BILITOT 1.0 01/04/2023   ALKPHOS 150 (H) 01/04/2023   AST 214 (H) 01/04/2023   ALT 221 (H) 01/04/2023   ANIONGAP 9 01/06/2023    GFR: Estimated Creatinine Clearance: 34.8 mL/min (A) (by C-G formula based on SCr of 2.44 mg/dL (H)).  No results found for this or any previous visit (from the past 240 hour(s)).    Radiology Studies: No results found.    LOS: 17 days    Jacquelin Hawking, MD [facility] 01/06/2023, 9:54 AM   If 7PM-7AM, please contact night-coverage [email or web address]

## 2023-01-06 NOTE — Progress Notes (Signed)
PHARMACY - TOTAL PARENTERAL NUTRITION CONSULT NOTE  Indication:  SMA syndrome w associated weight loss  Patient Measurements: Height: 5\' 11"  (180.3 cm) Weight: 99.5 kg (219 lb 5.7 oz) IBW/kg (Calculated) : 75.3 TPN AdjBW (KG): 80.4 Body mass index is 30.59 kg/m. Usual Weight: 95 kg, has lost 50lbs recently   Assessment:  67 yo M admitted with abdominal pain, weight loss, and poor PO intake. 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 He is now s/p SMA stent placement. He was initially left is discontinuity with open abdomen and was closed on 9/16. TPN was initiated 9/14.   Pt evaluated by speech therapy and continues to have signs of dysphagia and recommended to keep pt NPO except ice chips on 9/27.   Glucose / Insulin: A1c 6.3% - CBGs < 180; used 13u SSI/24 hours.  Electrolytes: Na/Cl down to 149/120, K 3.3 (s/p KCl + from IVF), CO2 down to 20, coCa 9.5, Phos 3.4, Mag down to 1.5 Renal: off CRRT 9/24 >> iHD if needed, none yet - SCr 2.44, BUN 104 >> continuing to trend down off CRRT Hepatic: 9/26: LFTs down slightly (AST/ALT 214/221), alk phos up to 150, tbili down to 1, albumin 1.9 (9/27), TG mildly elevated at 160 (9/22) Intake / Output; MIVF: UOP decreased to 0.7 ml/kg/hr off CRRT, LBM 9/28. D5 + 93mEq/L @ 175/hr per nephro GI Imaging:  9/20 CTA: improved ostial stenosis post stenting, thrombus/occlusion of SMA/ileocolic artery improving, surgical site has increased dilatation, L lung nodule GI Surgeries / Procedures:  9/14 re-ex lap, left in discontinuity with plans for re-exploration  9/16 re-ex lap, SBR with anastomosis x2, abdominal closure Wounds (aside from abd): Deep tissue pressure injury to B/L buttocks  Central access: CVC LIJ 12/23/22 TPN start date: 12/23/22  Nutritional Goals: Goal concentrated TPN rate is 80 mL/hr (provides 148 g AA and 2302 kCal per day) Goal un-concentrated TPN is 100 mL/hr  (provides 140g AA and 2253 kCal per day)  RD Estimated Needs Total Energy Estimated Needs: 2300-2500 kcals Total Protein Estimated Needs: 140 -150 g Total Fluid Estimated Needs: >/= 2.3 L  Current Nutrition:  NPO and TPN   Plan:  Continue TPN at goal rate of 100 ml/hr to meet 100% of needs Electrolytes in TPN: Na 0 mEq/L, K increased to 17 mEq/L (~88mEq/day), Ca 4 mEq/L, Mg increased to 5 mEq/L, Phos 3 mmol/L, Cl:Ac changed to max acetate.  Add standard MVI and trace elements to TPN Continue resistant SSI to q6 hours - CBGs consistently between 120-140s, may be able to wean SSI further.  Monitor TPN labs on Mon/Thurs >> daily renal function panel per MD  Monitor Na and volume status F/u plans for speech re-evaluation vs. PEG tube placement  D/w Nephro - continues to have a significant free water deficit, but sodium is improving. Plan to continue D5+20KCl at 170ml/hr until deficit is corrected. KCl fluids running to provide ~84 mEq total over 24 hours. Give additional KCl IV x1 and Mag sulfate 4g IV x1 outside of TPN Recheck BMP at 16:00 and replete K prn  Wilburn Cornelia, PharmD, BCPS Clinical Pharmacist 01/06/2023 12:37 PM   Please refer to [facility] for pharmacy phone number

## 2023-01-06 NOTE — Progress Notes (Signed)
  Progress Note    01/06/2023 7:07 AM 12 Days Post-Op  Subjective:  resting; denies any n/v with NGT out.   afebrile  Vitals:   01/05/23 2305 01/06/23 0310  BP: (!) 155/81 (!) 159/82  Pulse: 90   Resp: 17 20  Temp: 98.3 F (36.8 C) 98.5 F (36.9 C)  SpO2: 98%     Physical Exam: General:  no distress Lungs:  non labored Incisions:  midline with bandage and is clean and intact Abdomen:  soft, NT  CBC    Component Value Date/Time   WBC 10.2 01/05/2023 1253   RBC 3.37 (L) 01/05/2023 1253   HGB 9.3 (L) 01/05/2023 1253   HCT 29.0 (L) 01/05/2023 1253   PLT 246 01/05/2023 1253   MCV 86.1 01/05/2023 1253   MCH 27.6 01/05/2023 1253   MCHC 32.1 01/05/2023 1253   RDW 18.2 (H) 01/05/2023 1253   LYMPHSABS 0.9 10/30/2022 1654   MONOABS 0.6 10/30/2022 1654   EOSABS 0.3 10/30/2022 1654   BASOSABS 0.0 10/30/2022 1654    BMET    Component Value Date/Time   NA 155 (H) 01/05/2023 1253   K 3.0 (L) 01/05/2023 1253   CL 122 (H) 01/05/2023 1253   CO2 24 01/05/2023 1253   GLUCOSE 165 (H) 01/05/2023 1253   BUN 116 (H) 01/05/2023 1253   CREATININE 2.70 (H) 01/05/2023 1253   CALCIUM 8.1 (L) 01/05/2023 1253   GFRNONAA 25 (L) 01/05/2023 1253   GFRAA >60 04/14/2015 1127    INR    Component Value Date/Time   INR 1.7 (H) 12/29/2022 0809     Intake/Output Summary (Last 24 hours) at 01/06/2023 0707 Last data filed at 01/06/2023 0418 Gross per 24 hour  Intake 2911.17 ml  Output 2200 ml  Net 711.17 ml      Assessment/Plan:  67 y.o. male is s/p:  SMA stenting and small bowel resection   12 Days Post-Op   -abdomen soft.  No n/v with NGT removed.   -NGT removed yesterday -speech recommending npo/ice chips prn after oral care and recommending CIR when ready -DVT prophylaxis:  argagroban; Dr. Karin Lieu recommended starting DOAC yesterday, however, pt is npo without NGT.   -TPN being continued given npo status   Doreatha Massed, PA-C [facility] [phone] 01/06/2023 7:07 AM

## 2023-01-06 NOTE — Progress Notes (Addendum)
[facility] Progress Note  12 Days Post-Op  Subjective: NG is out. Pt denies pain or nausea.     Objective: Vital signs in last 24 hours: Temp:  [97.4 F (36.3 C)-98.5 F (36.9 C)] 97.4 F (36.3 C) (09/28 0726) Pulse Rate:  [85-95] 90 (09/27 2305) Resp:  [17-23] 20 (09/28 0726) BP: (155-171)/(71-94) 156/83 (09/28 0726) SpO2:  [98 %-99 %] 98 % (09/27 2305) Weight:  [99.5 kg] 99.5 kg (09/28 0500) Last BM Date : 01/05/23  Intake/Output from previous day: 09/27 0701 - 09/28 0700 In: 2911.2 [I.V.:2902.4; IV Piggyback:8.8] Out: 2200 [Urine:1650; Emesis/NG output:550] Intake/Output this shift: Total I/O In: -  Out: 900 [Urine:900]  PE: Gen: alert Card:  RRR Pulm: slightly labored  Abd: mildly distended but soft, diffusely tender, mildly distended, midline incision open at skin, wound is clean and dry.   Lab Results:  Recent Labs    01/04/23 0444 01/05/23 1253  WBC 14.4* 10.2  HGB 9.1* 9.3*  HCT 26.8* 29.0*  PLT 223 246   BMET Recent Labs    01/05/23 1253 01/06/23 0620  NA 155* 149*  K 3.0* 3.3*  CL 122* 120*  CO2 24 20*  GLUCOSE 165* 171*  BUN 116* 104*  CREATININE 2.70* 2.44*  CALCIUM 8.1* 7.8*   PT/INR No results for input(s): "LABPROT", "INR" in the last 72 hours.  CMP     Component Value Date/Time   NA 149 (H) 01/06/2023 0620   K 3.3 (L) 01/06/2023 0620   CL 120 (H) 01/06/2023 0620   CO2 20 (L) 01/06/2023 0620   GLUCOSE 171 (H) 01/06/2023 0620   BUN 104 (H) 01/06/2023 0620   CREATININE 2.44 (H) 01/06/2023 0620   CALCIUM 7.8 (L) 01/06/2023 0620   PROT 5.0 (L) 01/04/2023 0444   PROT 6.3 03/16/2020 0800   ALBUMIN 1.9 (L) 01/05/2023 0515   ALBUMIN 4.3 03/16/2020 0800   AST 214 (H) 01/04/2023 0444   ALT 221 (H) 01/04/2023 0444   ALKPHOS 150 (H) 01/04/2023 0444   BILITOT 1.0 01/04/2023 0444   BILITOT 0.8 03/16/2020 0800   GFRNONAA 28 (L) 01/06/2023 0620   GFRAA >60 04/14/2015 1127   Lipase     Component Value Date/Time    LIPASE 28 11/27/2022 0755       Assessment/Plan 67 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 Karin Lieu, exploratory laparotomy and small bowel resection Dr. Janee Morn S/p re-exploration, replace abthera FB 12/23/22   S/p re-exploration, small bowel resection with anastomosis x2, assessment of bowel perfusion with ICG, abdominal closure 9/16 Dr. Andrey Campanile - Continue TPN - Advance diet as tolerated per speech recommendations- currently just ice chips. Monitor bowel function.   Addendum 10:45am- notified by rn of loose black stools, FOBT pending.      LOS: 17 days   Berna Bue, MD     01/06/23 8:41 AM

## 2023-01-06 NOTE — Progress Notes (Addendum)
  Progress Note    01/06/2023 11:08 AM 12 Days Post-Op  Subjective:  resting; denies any n/v with NGT out.   afebrile  Vitals:   01/06/23 0310 01/06/23 0726  BP: (!) 159/82 (!) 156/83  Pulse:    Resp: 20 20  Temp: 98.5 F (36.9 C) (!) 97.4 F (36.3 C)  SpO2:      Physical Exam: General:  no distress Lungs:  non labored Incisions:  midline with bandage and is clean and intact Abdomen:  soft, NT  CBC    Component Value Date/Time   WBC 9.7 01/06/2023 0620   RBC 3.29 (L) 01/06/2023 0620   HGB 8.9 (L) 01/06/2023 0620   HCT 28.0 (L) 01/06/2023 0620   PLT 245 01/06/2023 0620   MCV 85.1 01/06/2023 0620   MCH 27.1 01/06/2023 0620   MCHC 31.8 01/06/2023 0620   RDW 18.2 (H) 01/06/2023 0620   LYMPHSABS 0.9 10/30/2022 1654   MONOABS 0.6 10/30/2022 1654   EOSABS 0.3 10/30/2022 1654   BASOSABS 0.0 10/30/2022 1654    BMET    Component Value Date/Time   NA 149 (H) 01/06/2023 0620   K 3.3 (L) 01/06/2023 0620   CL 120 (H) 01/06/2023 0620   CO2 20 (L) 01/06/2023 0620   GLUCOSE 171 (H) 01/06/2023 0620   BUN 104 (H) 01/06/2023 0620   CREATININE 2.44 (H) 01/06/2023 0620   CALCIUM 7.8 (L) 01/06/2023 0620   GFRNONAA 28 (L) 01/06/2023 0620   GFRAA >60 04/14/2015 1127    INR    Component Value Date/Time   INR 1.7 (H) 12/29/2022 0809     Intake/Output Summary (Last 24 hours) at 01/06/2023 1108 Last data filed at 01/06/2023 0730 Gross per 24 hour  Intake 2911.17 ml  Output 3100 ml  Net -188.83 ml      Assessment/Plan:  67 y.o. male is s/p:  SMA stenting and small bowel resection   12 Days Post-Op   -abdomen soft.  No n/v with NGT removed.   -NGT removed yesterday -speech recommending npo/ice chips prn after oral care and recommending CIR when ready -DVT prophylaxis:  argagroban; Dr. Karin Lieu recommended starting DOAC yesterday, however, pt is npo without NGT.   -TPN being continued given npo status   Doreatha Massed, PA-C [facility] [phone] 01/06/2023 11:08 AM  VASCULAR STAFF ADDENDUM: I have independently interviewed and examined the patient. I agree with the above.  Overall states no abdominal pain, feels very tired. Currently n.p.o. per speech pathology recommendations Discussed the importance of talking, and phonating is much as possible in an effort to work at the muscles in the throat and neck.  He is aware that should he continue to fail swallow's, he may require PEG tube. Fecal occult blood test pending due to dark bowel movement.  Should this be positive, we would decrease argatroban dose.  A significant portion of his small bowel is living off of a 3.5 mm x 44 mm stent.  I would like to keep anticoagulation and antiplatelet running for as long as possible to prevent thrombosis, as thrombosis would likely result in mortality.  Victorino Sparrow MD [facility] Phone Number: [phone] 01/06/2023 11:08 AM

## 2023-01-06 NOTE — Progress Notes (Signed)
ANTICOAGULATION CONSULT NOTE - Follow Up  Pharmacy Consult for argatroban Indication: SMA stenosis/occlusion  Allergies  Allergen Reactions   Zestril [Lisinopril] Cough    Patient Measurements: Height: 5\' 11"  (180.3 cm) Weight: 99.5 kg (219 lb 5.7 oz) IBW/kg (Calculated) : 75.3 Heparin Dosing Weight: 94.6kg  Vital Signs: Temp: 97.4 F (36.3 C) (09/28 0726) Temp Source: Oral (09/28 0726) BP: 156/83 (09/28 0726) Pulse Rate: 90 (09/27 2305)  Labs: Recent Labs    01/04/23 0444 01/05/23 0515 01/05/23 1253 01/06/23 0620  HGB 9.1*  --  9.3* 8.9*  HCT 26.8*  --  29.0* 28.0*  PLT 223  --  246 245  APTT 52*  --  50* 50*  CREATININE 2.90* 2.79* 2.70* 2.44*    Estimated Creatinine Clearance: 34.8 mL/min (A) (by C-G formula based on SCr of 2.44 mg/dL (H)).   Assessment: 66 YOM presenting with abdominal pain found to have mesenteric ischemia. Pharmacy consulted for IV heparin. No AC PTA. Pt s/p ex lap, bowel resection, thrombectomy, and angioplasty 9/12, heparin resumed postop.  Despite multiple rate increases, unable to get heparin to a detectable level, so patient was transitioned to direct thrombin inhibitor argatroban.  aPTT today is therapeutic at 50 seconds on Argatroban at 0.17 mcg/kg/min HgB 8.9 today   Goal of Therapy:  aPTT 50-90 seconds  Monitor platelets by anticoagulation protocol: Yes  Plan:  Continue argatroban at 0.17 mcg/kg/min  Monitor daily APTT and CBC Monitor for signs/symptoms of bleeding  Thank you Okey Regal, PharmD 01/06/2023 10:30 AM   Please check AMION for all [facility] phone numbers After 10:00 PM, call Main Pharmacy [phone]

## 2023-01-06 NOTE — Progress Notes (Signed)
Patient ID: [name] [name], male   DOB: [birth year], 67 y.o.   MRN: [id number] S: Patient tired and has difficulty answering most questions but is able to indicate that he understands and denies any specific issues.  Creatinine has improved to 2.4, decent urine output, sodium improving. O:BP (!) 156/83 (BP Location: Left Arm)   Pulse 90   Temp (!) 97.4 F (36.3 C) (Oral)   Resp 20   Ht 5\' 11"  (1.803 m)   Wt 99.5 kg   SpO2 98%   BMI 30.59 kg/m   Intake/Output Summary (Last 24 hours) at 01/06/2023 1006 Last data filed at 01/06/2023 0730 Gross per 24 hour  Intake 2911.17 ml  Output 3100 ml  Net -188.83 ml   Intake/Output: I/O last 3 completed shifts: In: 2911.2 [I.V.:2902.4; IV Piggyback:8.8] Out: 4000 [Urine:3450; Emesis/NG output:550]  Intake/Output this shift:  Total I/O In: -  Out: 900 [Urine:900] Weight change:  Gen: ill-appearing, NAD CVS: Normal rate, no rub Resp:decreased inspiratory effort, bilateral chest rise Abd: soft, NT Ext: Trace edema in the lower extremities present  Recent Labs  Lab 12/31/22 1508 12/31/22 2102 01/01/23 0351 01/01/23 1624 01/02/23 0323 01/03/23 0315 01/04/23 0444 01/05/23 0515 01/05/23 1253 01/06/23 0620  NA 137   < > 137 139 137 141 147* 153* 155* 149*  K 3.7   < > 3.7 3.7 3.4* 3.3* 3.1* 3.0* 3.0* 3.3*  CL 102  --  104 105 103 108 112* 121* 122* 120*  CO2 25  --  24 23 24 22 22 22 24  20*  GLUCOSE 128*  --  230* 174* 163* 138* 157* 135* 165* 171*  BUN 74*  --  75* 69* 64* 100* 114* 116* 116* 104*  CREATININE 2.08*  --  2.07* 2.19* 1.97* 2.77* 2.90* 2.79* 2.70* 2.44*  ALBUMIN 1.7*  --  1.6* 1.7* 1.7* 2.0* 2.0* 1.9*  --   --   CALCIUM 7.6*  --  7.2* 7.0* 7.6* 7.9* 7.9* 8.0* 8.1* 7.8*  PHOS 1.7*  --  2.1* 5.5* 3.5 4.5 4.2 3.9  --  3.4  AST  --   --  221*  --   --   --  214*  --   --   --   ALT  --   --  209*  --   --   --  221*  --   --   --    < > = values in this interval not displayed.   Liver Function Tests: Recent Labs  Lab  01/01/23 0351 01/01/23 1624 01/03/23 0315 01/04/23 0444 01/05/23 0515  AST 221*  --   --  214*  --   ALT 209*  --   --  221*  --   ALKPHOS 121  --   --  150*  --   BILITOT 1.4*  --   --  1.0  --   PROT 4.6*  --   --  5.0*  --   ALBUMIN 1.6*   < > 2.0* 2.0* 1.9*   < > = values in this interval not displayed.   No results for input(s): "LIPASE", "AMYLASE" in the last 168 hours. No results for input(s): "AMMONIA" in the last 168 hours. CBC: Recent Labs  Lab 01/03/23 0354 01/03/23 1847 01/04/23 0444 01/05/23 1253 01/06/23 0620  WBC 15.4* 15.4* 14.4* 10.2 9.7  HGB 6.4* 8.5* 9.1* 9.3* 8.9*  HCT 19.9* 26.2* 26.8* 29.0* 28.0*  MCV 82.9 81.1 82.7 86.1 85.1  PLT 140*  186 223 246 245   Cardiac Enzymes: No results for input(s): "CKTOTAL", "CKMB", "CKMBINDEX", "TROPONINI" in the last 168 hours. CBG: Recent Labs  Lab 01/05/23 1611 01/05/23 1805 01/05/23 2055 01/06/23 0058 01/06/23 0550  GLUCAP 126* 144* 140* 141* 126*    Iron Studies: No results for input(s): "IRON", "TIBC", "TRANSFERRIN", "FERRITIN" in the last 72 hours. Studies/Results: No results found.  sodium chloride   Intravenous Once   aspirin  300 mg Rectal Daily   Chlorhexidine Gluconate Cloth  6 each Topical Daily   insulin aspart  0-20 Units Subcutaneous Q6H   pantoprazole (PROTONIX) IV  40 mg Intravenous Q12H   sodium chloride flush  3 mL Intravenous Q12H    BMET    Component Value Date/Time   NA 149 (H) 01/06/2023 0620   K 3.3 (L) 01/06/2023 0620   CL 120 (H) 01/06/2023 0620   CO2 20 (L) 01/06/2023 0620   GLUCOSE 171 (H) 01/06/2023 0620   BUN 104 (H) 01/06/2023 0620   CREATININE 2.44 (H) 01/06/2023 0620   CALCIUM 7.8 (L) 01/06/2023 0620   GFRNONAA 28 (L) 01/06/2023 0620   GFRAA >60 04/14/2015 1127   CBC    Component Value Date/Time   WBC 9.7 01/06/2023 0620   RBC 3.29 (L) 01/06/2023 0620   HGB 8.9 (L) 01/06/2023 0620   HCT 28.0 (L) 01/06/2023 0620   PLT 245 01/06/2023 0620   MCV 85.1  01/06/2023 0620   MCH 27.1 01/06/2023 0620   MCHC 31.8 01/06/2023 0620   RDW 18.2 (H) 01/06/2023 0620   LYMPHSABS 0.9 10/30/2022 1654   MONOABS 0.6 10/30/2022 1654   EOSABS 0.3 10/30/2022 1654   BASOSABS 0.0 10/30/2022 1654      Assessment/Plan:   Acute kidney injury on CKD IIIb: Baseline creatinine level around 2.0.  Acute kidney injury likely ischemic ATN due to ex lap surgery/IV contrast/hypotension  Started on HD 9/18 due to inability to wean from vent and worsening renal function.  He was then switched to CRRT on 9/20-24.  Urine output now improving. Agree with not intervening on right RAS given that his right kidney is already much smaller than anticipated, discussed with VVS at that time. Likely not much function being contributed from right.   Continue holding hemodialysis Hypernatremia management as below with hydration Consider Lasix if needed for volume excess Consider removing dialysis catheter in the next couple days if he has ongoing improvement Strict ins and out and close lab monitoring. Acute mesenteric ischemia: Status post small bowel resection and superior mesenteric artery stent placement.  Followed by vascular surgeon and currently on agatroban since there was inadequate response to heparin. S/p OR again 9/16 for exlap and further small bowel resection, abd closed. S/p CTA 9/20 without anything emergent Hypernatremia: Were concentrating TPN because of some volume excess but now needs free water. Continue supplementing. Improving. Vent dependent respiratory failure: Now extubated.  Overall improving  Anemia likely due to blood loss: transfuse prn for hgb <7.    Shock -no longer wiring pressors.  Much improved.  Any antibiotics per primary team Severe protein malnutrition - on  TPN advancing diet as able

## 2023-01-06 NOTE — Progress Notes (Signed)
SLP Cancellation Note  Patient Details Name: [name] [name] MRN: [id number] DOB: 23-Nov-1955   Cancelled treatment:        Pt attempted to be seen this afternoon but denied PO trials, wife states "it's been a rough day". SLP to f/u again to determine PO readiness.  Dione Housekeeper M.S. CCC-SLP

## 2023-01-07 DIAGNOSIS — K551 Chronic vascular disorders of intestine: Secondary | ICD-10-CM | POA: Diagnosis not present

## 2023-01-07 LAB — HEPATIC FUNCTION PANEL
ALT: 305 U/L — ABNORMAL HIGH (ref 0–44)
AST: 155 U/L — ABNORMAL HIGH (ref 15–41)
Albumin: 1.8 g/dL — ABNORMAL LOW (ref 3.5–5.0)
Alkaline Phosphatase: 128 U/L — ABNORMAL HIGH (ref 38–126)
Bilirubin, Direct: 0.2 mg/dL (ref 0.0–0.2)
Indirect Bilirubin: 0.3 mg/dL (ref 0.3–0.9)
Total Bilirubin: 0.5 mg/dL (ref 0.3–1.2)
Total Protein: 4.7 g/dL — ABNORMAL LOW (ref 6.5–8.1)

## 2023-01-07 LAB — CBC
HCT: 25.9 % — ABNORMAL LOW (ref 39.0–52.0)
HCT: 22 % — ABNORMAL LOW (ref 39.0–52.0)
Hemoglobin: 8.1 g/dL — ABNORMAL LOW (ref 13.0–17.0)
Hemoglobin: 7.3 g/dL — ABNORMAL LOW (ref 13.0–17.0)
MCH: 26.6 pg (ref 26.0–34.0)
MCH: 28 pg (ref 26.0–34.0)
MCHC: 31.3 g/dL (ref 30.0–36.0)
MCHC: 33.2 g/dL (ref 30.0–36.0)
MCV: 84.9 fL (ref 80.0–100.0)
MCV: 84.3 fL (ref 80.0–100.0)
Platelets: 235 10*3/uL (ref 150–400)
Platelets: 226 10*3/uL (ref 150–400)
RBC: 3.05 MIL/uL — ABNORMAL LOW (ref 4.22–5.81)
RBC: 2.61 MIL/uL — ABNORMAL LOW (ref 4.22–5.81)
RDW: 18 % — ABNORMAL HIGH (ref 11.5–15.5)
RDW: 17.5 % — ABNORMAL HIGH (ref 11.5–15.5)
WBC: 10.1 10*3/uL (ref 4.0–10.5)
WBC: 13.9 10*3/uL — ABNORMAL HIGH (ref 4.0–10.5)
nRBC: 0 % (ref 0.0–0.2)
nRBC: 0.1 % (ref 0.0–0.2)

## 2023-01-07 LAB — BASIC METABOLIC PANEL
Anion gap: 13 (ref 5–15)
BUN: 97 mg/dL — ABNORMAL HIGH (ref 8–23)
CO2: 17 mmol/L — ABNORMAL LOW (ref 22–32)
Calcium: 7.7 mg/dL — ABNORMAL LOW (ref 8.9–10.3)
Chloride: 113 mmol/L — ABNORMAL HIGH (ref 98–111)
Creatinine, Ser: 2.34 mg/dL — ABNORMAL HIGH (ref 0.61–1.24)
GFR, Estimated: 30 mL/min — ABNORMAL LOW (ref >60)
Glucose, Bld: 174 mg/dL — ABNORMAL HIGH (ref 70–99)
Potassium: 4.3 mmol/L (ref 3.5–5.1)
Sodium: 143 mmol/L (ref 135–145)

## 2023-01-07 LAB — GLUCOSE, CAPILLARY
Glucose-Capillary: 137 mg/dL — ABNORMAL HIGH (ref 70–99)
Glucose-Capillary: 121 mg/dL — ABNORMAL HIGH (ref 70–99)
Glucose-Capillary: 134 mg/dL — ABNORMAL HIGH (ref 70–99)
Glucose-Capillary: 133 mg/dL — ABNORMAL HIGH (ref 70–99)

## 2023-01-07 LAB — HEMOGLOBIN AND HEMATOCRIT, BLOOD
HCT: 22.6 % — ABNORMAL LOW (ref 39.0–52.0)
Hemoglobin: 7.4 g/dL — ABNORMAL LOW (ref 13.0–17.0)

## 2023-01-07 LAB — PHOSPHORUS: Phosphorus: 3.4 mg/dL (ref 2.5–4.6)

## 2023-01-07 LAB — APTT: aPTT: 56 s — ABNORMAL HIGH (ref 24–36)

## 2023-01-07 LAB — MAGNESIUM: Magnesium: 1.9 mg/dL (ref 1.7–2.4)

## 2023-01-07 MED ORDER — TRAVASOL 10 % IV SOLN
INTRAVENOUS | Status: AC
  Filled 2023-01-07: qty 1404

## 2023-01-07 MED ORDER — DEXTROSE 5 % IV SOLN: INTRAVENOUS | Status: DC

## 2023-01-07 MED ORDER — MAGNESIUM SULFATE IN D5W 1-5 GM/100ML-% IV SOLN
1.0000 g | Freq: Once | INTRAVENOUS | Status: AC
  Administered 2023-01-07: 1 g via INTRAVENOUS
  Filled 2023-01-07: qty 100

## 2023-01-07 NOTE — Progress Notes (Signed)
HD catheter removal: Adhesive related skin irritation noted under dressing and securement device. Cavilon skin prep applied prior to pressure dressing. Pt instructed to remain flat in bed for :30. Keep dressing clean dry and intact for 24 hours.

## 2023-01-07 NOTE — Progress Notes (Addendum)
  Progress Note    01/07/2023 8:30 AM 13 Days Post-Op  Subjective:  says he is feeling much better than yesterday  Afebrile   Vitals:   01/07/23 0310 01/07/23 0711  BP: (!) 160/78 (!) 155/78  Pulse: 86   Resp: 16   Temp: 98 F (36.7 C) (!) 97.5 F (36.4 C)  SpO2: 100%     Physical Exam: General:  no distress Lungs:  non labored Incisions:  bandaged placed this am and is clean and dry Abdomen:  soft, NT  CBC    Component Value Date/Time   WBC 10.1 01/07/2023 0545   RBC 3.05 (L) 01/07/2023 0545   HGB 8.1 (L) 01/07/2023 0545   HCT 25.9 (L) 01/07/2023 0545   PLT 235 01/07/2023 0545   MCV 84.9 01/07/2023 0545   MCH 26.6 01/07/2023 0545   MCHC 31.3 01/07/2023 0545   RDW 18.0 (H) 01/07/2023 0545   LYMPHSABS 0.9 10/30/2022 1654   MONOABS 0.6 10/30/2022 1654   EOSABS 0.3 10/30/2022 1654   BASOSABS 0.0 10/30/2022 1654    BMET    Component Value Date/Time   NA 143 01/07/2023 0545   K 4.3 01/07/2023 0545   CL 113 (H) 01/07/2023 0545   CO2 17 (L) 01/07/2023 0545   GLUCOSE 174 (H) 01/07/2023 0545   BUN 97 (H) 01/07/2023 0545   CREATININE 2.34 (H) 01/07/2023 0545   CALCIUM 7.7 (L) 01/07/2023 0545   GFRNONAA 30 (L) 01/07/2023 0545   GFRAA >60 04/14/2015 1127    INR    Component Value Date/Time   INR 1.7 (H) 12/29/2022 0809     Intake/Output Summary (Last 24 hours) at 01/07/2023 0830 Last data filed at 01/07/2023 0500 Gross per 24 hour  Intake 3889.98 ml  Output 2726 ml  Net 1163.98 ml      Assessment/Plan:  67 y.o. male is s/p:  SMA stenting and small bowel resection   13 Days Post-Op   -abdomen is soft and pt feeling better today -his fecal occult blood was positive yesterday-Per Dr. Karin Lieu, A significant portion of his small bowel is living off of a 3.5 mm x 44 mm stent.  I would like to keep anticoagulation and antiplatelet running for as long as possible to prevent thrombosis, as thrombosis would likely result in mortality.  -pt npo per speech  recommendations except for ice chips.    Doreatha Massed, PA-C [facility] [phone] 01/07/2023 8:30 AM  VASCULAR STAFF ADDENDUM: I have independently interviewed and examined the patient. I agree with the above.  More rectal bleeding today. CBC pending  Argatroban held.  Rectal aspirin continues to be administered. Overall, he seems to be doing much better, and is much more alert and oriented.  We had a detailed discussion about his current clinical condition and the importance of p.o. intake.  He should have a speech eval today for swallowing. [name] also noted feeling helpless at night, feels as though some of his changes are rough.  I discussed this with nursing, and we we will try to do a better job of gentle changes and care.   Victorino Sparrow MD [facility] Phone Number: [phone] 01/07/2023 11:00 AM

## 2023-01-07 NOTE — Progress Notes (Signed)
Patient ID: DAKODAH GULBRANDSEN, male   DOB: 1955/11/21, 67 y.o.   MRN: [id number] S: Patient states he continues to feel better today.  Sodium improved.  Creatinine continues to improve.  Good urine output O:BP (!) 155/78 (BP Location: Left Arm)   Pulse 86   Temp (!) 97.5 F (36.4 C) (Oral)   Resp 16   Ht 5\' 11"  (1.803 m)   Wt 99.5 kg   SpO2 100%   BMI 30.59 kg/m   Intake/Output Summary (Last 24 hours) at 01/07/2023 1055 Last data filed at 01/07/2023 0948 Gross per 24 hour  Intake 3889.98 ml  Output 3275 ml  Net 614.98 ml   Intake/Output: I/O last 3 completed shifts: In: 6716.6 [I.V.:6411.2; IV Piggyback:305.4] Out: 4376 [Urine:4375; Stool:1]  Intake/Output this shift:  Total I/O In: -  Out: 550 [Urine:550] Weight change: 0 kg Gen: Lying in bed, no distress CVS: Normal rate, no rub Resp:decreased inspiratory effort, bilateral chest rise Abd: soft, NT Ext: Trace edema in the lower extremities present  Recent Labs  Lab 01/01/23 0351 01/01/23 1624 01/02/23 0323 01/03/23 0315 01/04/23 0444 01/05/23 0515 01/05/23 1253 01/06/23 0620 01/06/23 1809 01/07/23 0545  NA 137 139 137 141 147* 153* 155* 149* 147* 143  K 3.7 3.7 3.4* 3.3* 3.1* 3.0* 3.0* 3.3* 4.0 4.3  CL 104 105 103 108 112* 121* 122* 120* 116* 113*  CO2 24 23 24 22 22 22 24  20* 17* 17*  GLUCOSE 230* 174* 163* 138* 157* 135* 165* 171* 188* 174*  BUN 75* 69* 64* 100* 114* 116* 116* 104* 100* 97*  CREATININE 2.07* 2.19* 1.97* 2.77* 2.90* 2.79* 2.70* 2.44* 2.30* 2.34*  ALBUMIN 1.6* 1.7* 1.7* 2.0* 2.0* 1.9*  --   --   --  1.8*  CALCIUM 7.2* 7.0* 7.6* 7.9* 7.9* 8.0* 8.1* 7.8* 7.9* 7.7*  PHOS 2.1* 5.5* 3.5 4.5 4.2 3.9  --  3.4  --  3.4  AST 221*  --   --   --  214*  --   --   --   --  155*  ALT 209*  --   --   --  221*  --   --   --   --  305*   Liver Function Tests: Recent Labs  Lab 01/01/23 0351 01/01/23 1624 01/04/23 0444 01/05/23 0515 01/07/23 0545  AST 221*  --  214*  --  155*  ALT 209*  --  221*  --  305*   ALKPHOS 121  --  150*  --  128*  BILITOT 1.4*  --  1.0  --  0.5  PROT 4.6*  --  5.0*  --  4.7*  ALBUMIN 1.6*   < > 2.0* 1.9* 1.8*   < > = values in this interval not displayed.   No results for input(s): "LIPASE", "AMYLASE" in the last 168 hours. No results for input(s): "AMMONIA" in the last 168 hours. CBC: Recent Labs  Lab 01/03/23 1847 01/04/23 0444 01/05/23 1253 01/06/23 0620 01/06/23 1612 01/06/23 2200 01/07/23 0545  WBC 15.4* 14.4* 10.2 9.7  --   --  10.1  HGB 8.5* 9.1* 9.3* 8.9* 9.1* 8.9* 8.1*  HCT 26.2* 26.8* 29.0* 28.0* 29.2* 28.0* 25.9*  MCV 81.1 82.7 86.1 85.1  --   --  84.9  PLT 186 223 246 245  --   --  235   Cardiac Enzymes: No results for input(s): "CKTOTAL", "CKMB", "CKMBINDEX", "TROPONINI" in the last 168 hours. CBG: Recent Labs  Lab  01/06/23 1159 01/06/23 1759 01/06/23 2339 01/07/23 0334 01/07/23 0554  GLUCAP 143* 123* 150* 137* 121*    Iron Studies: No results for input(s): "IRON", "TIBC", "TRANSFERRIN", "FERRITIN" in the last 72 hours. Studies/Results: No results found.  sodium chloride   Intravenous Once   aspirin  300 mg Rectal Daily   Chlorhexidine Gluconate Cloth  6 each Topical Daily   insulin aspart  0-20 Units Subcutaneous Q6H   pantoprazole (PROTONIX) IV  40 mg Intravenous Q12H   sodium chloride flush  3 mL Intravenous Q12H    BMET    Component Value Date/Time   NA 143 01/07/2023 0545   K 4.3 01/07/2023 0545   CL 113 (H) 01/07/2023 0545   CO2 17 (L) 01/07/2023 0545   GLUCOSE 174 (H) 01/07/2023 0545   BUN 97 (H) 01/07/2023 0545   CREATININE 2.34 (H) 01/07/2023 0545   CALCIUM 7.7 (L) 01/07/2023 0545   GFRNONAA 30 (L) 01/07/2023 0545   GFRAA >60 04/14/2015 1127   CBC    Component Value Date/Time   WBC 10.1 01/07/2023 0545   RBC 3.05 (L) 01/07/2023 0545   HGB 8.1 (L) 01/07/2023 0545   HCT 25.9 (L) 01/07/2023 0545   PLT 235 01/07/2023 0545   MCV 84.9 01/07/2023 0545   MCH 26.6 01/07/2023 0545   MCHC 31.3 01/07/2023 0545    RDW 18.0 (H) 01/07/2023 0545   LYMPHSABS 0.9 10/30/2022 1654   MONOABS 0.6 10/30/2022 1654   EOSABS 0.3 10/30/2022 1654   BASOSABS 0.0 10/30/2022 1654      Assessment/Plan:   Acute kidney injury on CKD IIIb: Baseline creatinine level around 2.0.  Acute kidney injury likely ischemic ATN due to ex lap surgery/IV contrast/hypotension  Started on HD 9/18 due to inability to wean from vent and worsening renal function.  He was then switched to CRRT on 9/20-24.  Urine output now improving. Agree with not intervening on right RAS given that his right kidney is already much smaller than anticipated, discussed with VVS at that time. Likely not much function being contributed from right.   No needs for hemodialysis, remove dialysis catheter today Hypernatremia management as below with hydration Consider Lasix if needed for volume excess Strict ins and out and close lab monitoring. Acute mesenteric ischemia: Status post small bowel resection and superior mesenteric artery stent placement.  Followed by vascular surgeon and currently on agatroban since there was inadequate response to heparin. S/p OR again 9/16 for exlap and further small bowel resection, abd closed. S/p CTA 9/20 without anything emergent Hypernatremia: Were concentrating TPN because of some volume excess but now needs free water.  Can decrease D5 water down to 50 cc/h.  Can cut off as able once the patient can advance his diet.  Can cut off completely if able to receive enough free water with his TPN. Vent dependent respiratory failure: Now extubated.  Overall improving  Anemia likely due to blood loss: transfuse prn for hgb <7.    Shock -no longer wiring pressors.  Much improved.  Any antibiotics per primary team Severe protein malnutrition - on  TPN advancing diet as able

## 2023-01-07 NOTE — Progress Notes (Signed)
ANTICOAGULATION CONSULT NOTE - Follow Up  Pharmacy Consult for argatroban Indication: SMA stenosis/occlusion  Allergies  Allergen Reactions   Zestril [Lisinopril] Cough    Patient Measurements: Height: 5\' 11"  (180.3 cm) Weight: 99.5 kg (219 lb 5.7 oz) IBW/kg (Calculated) : 75.3 Heparin Dosing Weight: 94.6kg  Vital Signs: Temp: 97.5 F (36.4 C) (09/29 0711) Temp Source: Oral (09/29 0711) BP: 155/78 (09/29 0711) Pulse Rate: 86 (09/29 0310)  Labs: Recent Labs    01/05/23 1253 01/06/23 0620 01/06/23 1612 01/06/23 1809 01/06/23 2200 01/07/23 0545  HGB 9.3* 8.9* 9.1*  --  8.9* 8.1*  HCT 29.0* 28.0* 29.2*  --  28.0* 25.9*  PLT 246 245  --   --   --  235  APTT 50* 50*  --   --   --  56*  CREATININE 2.70* 2.44*  --  2.30*  --  2.34*    Estimated Creatinine Clearance: 36.3 mL/min (A) (by C-G formula based on SCr of 2.34 mg/dL (H)).   Assessment: 67 YOM presenting with abdominal pain found to have mesenteric ischemia. Pharmacy consulted for IV heparin. No AC PTA. Pt s/p ex lap, bowel resection, thrombectomy, and angioplasty 9/12, heparin resumed postop.  Despite multiple rate increases, unable to get heparin to a detectable level, so patient was transitioned to direct thrombin inhibitor argatroban.  9/29 AM: aPTT today remains  therapeutic at 56 seconds on Argatroban at 0.17 mcg/kg/min. There was report from patient's RN overnight that patient was having some melanotic stools. After discussion with MD, the decision was made to continue argatroban and monitor Hgb closely. Hgb with slight drift down this AM from 8.9 to 8.1. RN today reports small amount of blood in patient's stool.   LFTs have also been elevated, but all are trending down except ALT. IF LFTs remain elevated and bleeding issues persist, could consider switching to bivalrudin. However, patient is already on the low end of the aPTT goal and unsure if this would improve bleeding issues.   Goal of Therapy:  aPTT 50-90  seconds  Monitor platelets by anticoagulation protocol: Yes  Plan:  Continue argatroban at 0.17 mcg/kg/min  Monitor daily APTT and CBC Monitor for signs/symptoms of bleeding  Jani Gravel, PharmD Clinical Pharmacist  01/07/2023 7:23 AM

## 2023-01-07 NOTE — Progress Notes (Signed)
PROGRESS NOTE    [name] [name]  ZOX:[id number] DOB: Nov 16, 1955 DOA: 12/20/2022 PCP: [name], MD   Brief Narrative: [name] [name] is a 67 y.o. male with a history of GERD, hyperlipidemia, hypertension.  Patient presented secondary to persistent abdominal pain with concern for superior mesenteric artery thrombus and resultant mesenteric ischemia.  CTA abdomen confirmed acute occlusion of the distal SMA in addition to occlusion of the inferior mesenteric artery.  Patient was taken emergently to the OR by general surgery and vascular surgery on 9/12 for exploratory laparotomy with small bowel resection in addition to percutaneous section of thrombectomy to the SMA, balloon angioplasty of the SMA and stenting of SMA.  Patient required continued intubation and ventilator support postop and was admitted to the ICU for management.  Patient required return to the operating room on 9/14 for reexploratory laparotomy and evaluation of SMA with evidence of ischemic but not frankly necrotic bowel.  Patient returned to the operating room for the third time on 9/16 for reexploratory laparotomy with small bowel resection with anastomosis x 2.  During hospitalization, patient was also managed for atrial fibrillation with amiodarone and return to sinus rhythm.  He also developed AKI and nephrology was consulted.  Patient required intermittent hemodialysis in addition to a course of CRRT while in the ICU.  Patient transferred out of the ICU on 9/27.   Assessment and Plan:  Acute mesenteric ischemia Confirmed on CTA abdomen and pelvis with evidence of occlusion of the distal superior mesenteric artery and inferior mesenteric artery.  Patient required return to operating room x 3 for exploratory laparotomy, angioplasty/stent of SMA, bowel resection with anastomosis.  General surgery removed NG tube on 9/27 with plan to advance diet as able pending speech therapy recommendations. -Ongoing General Surgery  recommendations: advance diet as tolerated/per SLP  Acute respiratory failure with hypoxia Post-operative. Patient remained intubated post surgery on 9/12 and managed in ICU via mechanical ventilation. Patient weaned off ventilator support and extubated on 9/22. Patient weaned to room air. Resolved.  Acute metabolic encephalopathy During ICU admission. Resolved.  AKI on CKD stage IIIb Baseline creatinine around 2.0. Nephrology consulted. Concern for ischemic ATN secondary to surgery, IV contrast, and hypotension. Intermittent hemodialysis started on 9/18 with transition to CRRT on 9/20 while in ICU. CRRT was discontinued on 9/24 secondary to recurrent clotting. Hemodialysis held secondary to improving urine output. -Nephrology recommendations: Removal of HD catheter; manage fluid with Lasix  Hypernatremia Associated poor oral intake and renal failure. Sodium improved with initiation of D5 water IV fluids. Hypernatremia resolved. -Nephrology recommendations: D5 water IV  Black stools Hematochezia Concern for melena with evidence of hematochezia and confirmed by positive FOBT.Marland Kitchen Complicated by recent abdominal surgeries and argatroban use. Patient reports a history of GI bleed in the past. Per vascular surgery, continuing anticoagulation is critical for patient's survival. Frequency of blood per rectum appears to have slowed.  Hypokalemia Potassium supplementation per pharmacy while on TPN  Hypomagnesemia Magnesium supplementation per pharmacy while on TPN  Hypocalcemia Normal when corrected for hypoalbuminemia.  Postoperative anemia Acute blood loss anemia Hemoglobin of 13.4 on admission. Patient has required a total of 5 units of PRBC via transfusion this admission to date related to multiple surgeries. Complicated by anticoagulation use. Patient developed melena/hematochezia on 9/28. Hemoglobin has drifted downwards. -H&H q8 hours -CBC in AM  Severe malnutrition Patient started on  TPN. SLP evaluated on 9/27 with recommendation for NPO with ice chips only. -Continue TPN per general surgery -  Ongoing SLP eval/recommendations  Primary hypertension Uncontrolled. Antihypertensives held.  GERD -Continue Protonix  Hyperlipidemia Patient is on Crestor as an outpatient which is held.  Transient atrial fibrillation Patient managed on amiodarone with conversion back to sinus rhythm. Amiodarone continued with plan to transition to oral.  Pressure injury Right buttocks. Unclear if present on admission.    DVT prophylaxis: Argatroban Code Status:   Code Status: Full Code Family Communication: None at bedside Disposition Plan: Discharge likely to inpatient rehabilitation once able to wean TPN, advance PO diet and pending ongoing general surgery and nephrology recommendations   Consultants:  PCCM General surgery Nephrology Interventional radiology  Procedures:    Antimicrobials: Zosyn Vancomycin Meropenem    Subjective: Feels okay this morning. No concerns. States he has a small amount of blood in his stool this morning.  Objective: BP (!) 155/78 (BP Location: Left Arm)   Pulse 86   Temp (!) 97.5 F (36.4 C) (Oral)   Resp 16   Ht 5\' 11"  (1.803 m)   Wt 99.5 kg   SpO2 100%   BMI 30.59 kg/m   Examination:  General exam: Appears calm and comfortable Respiratory system: Clear to auscultation. Respiratory effort normal. Cardiovascular system: S1 & S2 heard, RRR. Gastrointestinal system: Abdomen is soft and nontender.  Decreased bowel sounds heard. Central nervous system: Alert and oriented. Psychiatry: Judgement and insight appear normal. Mood & affect appropriate.    Data Reviewed: I have personally reviewed following labs and imaging studies  CBC Lab Results  Component Value Date   WBC 10.1 01/07/2023   RBC 3.05 (L) 01/07/2023   HGB 8.1 (L) 01/07/2023   HCT 25.9 (L) 01/07/2023   MCV 84.9 01/07/2023   MCH 26.6 01/07/2023   PLT 235  01/07/2023   MCHC 31.3 01/07/2023   RDW 18.0 (H) 01/07/2023   LYMPHSABS 0.9 10/30/2022   MONOABS 0.6 10/30/2022   EOSABS 0.3 10/30/2022   BASOSABS 0.0 10/30/2022     Last metabolic panel Lab Results  Component Value Date   NA 143 01/07/2023   K 4.3 01/07/2023   CL 113 (H) 01/07/2023   CO2 17 (L) 01/07/2023   BUN 97 (H) 01/07/2023   CREATININE 2.34 (H) 01/07/2023   GLUCOSE 174 (H) 01/07/2023   GFRNONAA 30 (L) 01/07/2023   GFRAA >60 04/14/2015   CALCIUM 7.7 (L) 01/07/2023   PHOS 3.4 01/07/2023   PROT 4.7 (L) 01/07/2023   ALBUMIN 1.8 (L) 01/07/2023   BILITOT 0.5 01/07/2023   ALKPHOS 128 (H) 01/07/2023   AST 155 (H) 01/07/2023   ALT 305 (H) 01/07/2023   ANIONGAP 13 01/07/2023    GFR: Estimated Creatinine Clearance: 36.3 mL/min (A) (by C-G formula based on SCr of 2.34 mg/dL (H)).  No results found for this or any previous visit (from the past 240 hour(s)).    Radiology Studies: No results found.    LOS: 18 days    Jacquelin Hawking, MD [facility] 01/07/2023, 10:57 AM   If 7PM-7AM, please contact night-coverage [email or web address]

## 2023-01-07 NOTE — Progress Notes (Signed)
Speech Language Pathology Treatment: Dysphagia  Patient Details Name: [name] [name] MRN: [id number] DOB: May 04, 1955 Today's Date: 01/07/2023 Time: 1191-4782 SLP Time Calculation (min) (ACUTE ONLY): 20 min  Assessment / Plan / Recommendation Clinical Impression  Pt was seen for skilled ST targeting readiness for clinical diet initiation vs instrumental swallow study.  Pt was agreeable to a few trials of ice chips, thin liquid, and puree before becoming fatigued.  He was noted to have multiple swallows per bolus with puree, but otherwise tolerated trials without overt s/sx of aspiration or difficulty.  Recommend MBSS to further evaluate prior to diet initiation secondary to prolonged intubation.  Pt and wife were agreeable, therefore will plan for MBS tomorrow (9/30).  Recommend continuation of NPO with TPN until MBS is completed.     HPI HPI: Pt is a 67 yo male presenting to ED from OP vascular surgery office with known mesenteric artery stenosis s/p SMA angiogram. Admitted 9/11 with several week history of abdominal pain with 30 lb weight loss. W/u included mesenteric ischemia and necrotic area of jejunum s/p exploratory laparotomy and small bowel resection 9/12, reopening of laparotomy 9/14, second reopening and small bowel resection with anastomosis x2 9/26. Intubated 9/12-9/22. PMH includes testicular cancer, HTN, retinal detachment, GERD, HTN, HLD      SLP Plan  MBS      Recommendations for follow up therapy are one component of a multi-disciplinary discharge planning process, led by the attending physician.  Recommendations may be updated based on patient status, additional functional criteria and insurance authorization.    Recommendations  Diet recommendations: NPO Medication Administration: Via alternative means                  Oral care QID;Staff/trained caregiver to provide oral care     Dysphagia, unspecified (R13.10)     MBS    Eino Farber, M.S., CCC-SLP Acute  Rehabilitation Services Office: [phone]  Shanon Rosser [facility]  01/07/2023, 11:42 AM

## 2023-01-07 NOTE — Progress Notes (Signed)
[facility] Progress Note  13 Days Post-Op  Subjective: Patient had a rough day yesterday with intermittent bloody bowel movements, only 1 of these overnight.  Today he feels fairly well with no complaints.   Objective: Vital signs in last 24 hours: Temp:  [97.5 F (36.4 C)-99.1 F (37.3 C)] 97.5 F (36.4 C) (09/29 0711) Pulse Rate:  [86-96] 86 (09/29 0310) Resp:  [16-24] 16 (09/29 0310) BP: (145-167)/(75-84) 155/78 (09/29 0711) SpO2:  [98 %-100 %] 100 % (09/29 0310) Weight:  [99.5 kg] 99.5 kg (09/29 0500) Last BM Date : 01/06/23  Intake/Output from previous day: 09/28 0701 - 09/29 0700 In: 3890 [I.V.:3593.4; IV Piggyback:296.6] Out: 3626 [Urine:3625; Stool:1] Intake/Output this shift: No intake/output data recorded.  PE: Gen: alert Card:  RRR Pulm: slightly labored  Abd: mildly distended but soft, nontender this morning, midline incision open at skin, wound is clean and dry.   Lab Results:  Recent Labs    01/06/23 0620 01/06/23 1612 01/06/23 2200 01/07/23 0545  WBC 9.7  --   --  10.1  HGB 8.9*   < > 8.9* 8.1*  HCT 28.0*   < > 28.0* 25.9*  PLT 245  --   --  235   < > = values in this interval not displayed.   BMET Recent Labs    01/06/23 1809 01/07/23 0545  NA 147* 143  K 4.0 4.3  CL 116* 113*  CO2 17* 17*  GLUCOSE 188* 174*  BUN 100* 97*  CREATININE 2.30* 2.34*  CALCIUM 7.9* 7.7*   PT/INR No results for input(s): "LABPROT", "INR" in the last 72 hours.  CMP     Component Value Date/Time   NA 143 01/07/2023 0545   K 4.3 01/07/2023 0545   CL 113 (H) 01/07/2023 0545   CO2 17 (L) 01/07/2023 0545   GLUCOSE 174 (H) 01/07/2023 0545   BUN 97 (H) 01/07/2023 0545   CREATININE 2.34 (H) 01/07/2023 0545   CALCIUM 7.7 (L) 01/07/2023 0545   PROT 4.7 (L) 01/07/2023 0545   PROT 6.3 03/16/2020 0800   ALBUMIN 1.8 (L) 01/07/2023 0545   ALBUMIN 4.3 03/16/2020 0800   AST 155 (H) 01/07/2023 0545   ALT 305 (H) 01/07/2023 0545   ALKPHOS 128 (H)  01/07/2023 0545   BILITOT 0.5 01/07/2023 0545   BILITOT 0.8 03/16/2020 0800   GFRNONAA 30 (L) 01/07/2023 0545   GFRAA >60 04/14/2015 1127   Lipase     Component Value Date/Time   LIPASE 28 11/27/2022 0755       Assessment/Plan 67 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 Karin Lieu, exploratory laparotomy and small bowel resection Dr. Janee Morn S/p re-exploration, replace abthera FB 12/23/22   S/p re-exploration, small bowel resection with anastomosis x2, assessment of bowel perfusion with ICG, abdominal closure 9/16 Dr. Andrey Campanile - Continue TPN - Advance diet as tolerated per speech recommendations- currently just ice chips. Monitor bowel function.   -Suspect mucosal sloughing from recent ischemic event plus or minus anastomotic sloughing and bleeding.  This will typically be self-limited.  Hemoglobin has drifted down slightly, would continue to monitor closely, continue anticoagulation at this point/agree with Dr. Karin Lieu' note from yesterday that keeping his stent patent will be critical to survival.   LOS: 18 days   Berna Bue, MD     01/07/23 8:28 AM

## 2023-01-07 NOTE — Progress Notes (Addendum)
PHARMACY - TOTAL PARENTERAL NUTRITION CONSULT NOTE  Indication:  SMA syndrome w associated weight loss  Patient Measurements: Height: 5\' 11"  (180.3 cm) Weight: 99.5 kg (219 lb 5.7 oz) IBW/kg (Calculated) : 75.3 TPN AdjBW (KG): 80.4 Body mass index is 30.59 kg/m. Usual Weight: 95 kg, has lost 50lbs recently   Assessment:  67 yo M admitted with abdominal pain, weight loss, and poor PO intake. 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 He is now s/p SMA stent placement. He was initially left is discontinuity with open abdomen and was closed on 9/16. TPN was initiated 9/14.   Pt evaluated by speech therapy and continues to have signs of dysphagia and recommended to keep pt NPO except ice chips on 9/27.   Glucose / Insulin: A1c 6.3% - CBGs < 190; used 12u SSI/24 hours.  Electrolytes: Na/Cl down to 143/113, K 4.3 (s/p KCl + from IVF), CO2 down to 17, coCa 9.5, Phos 3.4, Mag 1.9 (s/p 4g Mag sulf) Renal: off CRRT 9/24 >> iHD if needed, none yet - SCr 2.34, BUN 97 >> continuing to trend down off CRRT Hepatic: AST down to 155, ALT up to 305, alk phos down to 128, tbili down to 0.5, albumin 1.8, TG mildly elevated at 160 (9/22) Intake / Output; MIVF: UOP up to 1.5 ml/kg/hr, LBM 9/28. D5 + 12mEq/L @ 175/hr per nephro GI Imaging:  9/20 CTA: improved ostial stenosis post stenting, thrombus/occlusion of SMA/ileocolic artery improving, surgical site has increased dilatation, L lung nodule GI Surgeries / Procedures:  9/14 re-ex lap, left in discontinuity with plans for re-exploration  9/16 re-ex lap, SBR with anastomosis x2, abdominal closure Wounds (aside from abd): Deep tissue pressure injury to B/L buttocks  Central access: CVC LIJ 12/23/22 TPN start date: 12/23/22  Nutritional Goals: Goal concentrated TPN rate is 80 mL/hr (provides 148 g AA and 2302 kCal per day) Goal un-concentrated TPN is 100 mL/hr (provides 140g AA and  2253 kCal per day)  RD Estimated Needs Total Energy Estimated Needs: 2300-2500 kcals Total Protein Estimated Needs: 140 -150 g Total Fluid Estimated Needs: >/= 2.3 L  Current Nutrition:  NPO and TPN   Plan:  Continue TPN at goal rate of 100 ml/hr to meet 100% of needs Electrolytes in TPN: Na 0 mEq/L, K 17 mEq/L (~81mEq/day), Ca 4 mEq/L, Mg 5 mEq/L, Phos 3 mmol/L, Cl:Ac - max acetate.  Add standard MVI and trace elements to TPN Continue resistant SSI to q6 hours  Give additional Magnesium sulfate 1g IV x1 outside of TPN Monitor TPN labs on Mon/Thurs >> daily renal function panel per MD  IVF per nephrology, decreased D5+20KCl to 50ml/hr F/u plans for speech re-evaluation vs. PEG tube placement   Wilburn Cornelia, PharmD, BCPS Clinical Pharmacist 01/07/2023 8:45 AM   Please refer to AMION for pharmacy phone number

## 2023-01-08 ENCOUNTER — Other Ambulatory Visit (HOSPITAL_COMMUNITY): Payer: Self-pay

## 2023-01-08 ENCOUNTER — Inpatient Hospital Stay (HOSPITAL_COMMUNITY): Payer: Medicare Other

## 2023-01-08 DIAGNOSIS — K921 Melena: Secondary | ICD-10-CM

## 2023-01-08 DIAGNOSIS — D62 Acute posthemorrhagic anemia: Secondary | ICD-10-CM

## 2023-01-08 DIAGNOSIS — K551 Chronic vascular disorders of intestine: Secondary | ICD-10-CM | POA: Diagnosis not present

## 2023-01-08 DIAGNOSIS — K559 Vascular disorder of intestine, unspecified: Secondary | ICD-10-CM | POA: Diagnosis not present

## 2023-01-08 LAB — CBC
HCT: 19 % — ABNORMAL LOW (ref 39.0–52.0)
Hemoglobin: 6 g/dL — CL (ref 13.0–17.0)
MCH: 26.8 pg (ref 26.0–34.0)
MCHC: 31.6 g/dL (ref 30.0–36.0)
MCV: 84.8 fL (ref 80.0–100.0)
Platelets: 196 10*3/uL (ref 150–400)
RBC: 2.24 MIL/uL — ABNORMAL LOW (ref 4.22–5.81)
RDW: 17.5 % — ABNORMAL HIGH (ref 11.5–15.5)
WBC: 9.8 10*3/uL (ref 4.0–10.5)
nRBC: 0 % (ref 0.0–0.2)

## 2023-01-08 LAB — COMPREHENSIVE METABOLIC PANEL
ALT: 198 U/L — ABNORMAL HIGH (ref 0–44)
AST: 79 U/L — ABNORMAL HIGH (ref 15–41)
Albumin: 1.6 g/dL — ABNORMAL LOW (ref 3.5–5.0)
Alkaline Phosphatase: 96 U/L (ref 38–126)
Anion gap: 7 (ref 5–15)
BUN: 102 mg/dL — ABNORMAL HIGH (ref 8–23)
CO2: 16 mmol/L — ABNORMAL LOW (ref 22–32)
Calcium: 7.4 mg/dL — ABNORMAL LOW (ref 8.9–10.3)
Chloride: 113 mmol/L — ABNORMAL HIGH (ref 98–111)
Creatinine, Ser: 2.24 mg/dL — ABNORMAL HIGH (ref 0.61–1.24)
GFR, Estimated: 31 mL/min — ABNORMAL LOW (ref >60)
Glucose, Bld: 214 mg/dL — ABNORMAL HIGH (ref 70–99)
Potassium: 3.8 mmol/L (ref 3.5–5.1)
Sodium: 136 mmol/L (ref 135–145)
Total Bilirubin: 0.6 mg/dL (ref 0.3–1.2)
Total Protein: 4.3 g/dL — ABNORMAL LOW (ref 6.5–8.1)

## 2023-01-08 LAB — TRIGLYCERIDES: Triglycerides: 88 mg/dL (ref <150)

## 2023-01-08 LAB — GLUCOSE, CAPILLARY
Glucose-Capillary: 120 mg/dL — ABNORMAL HIGH (ref 70–99)
Glucose-Capillary: 125 mg/dL — ABNORMAL HIGH (ref 70–99)
Glucose-Capillary: 130 mg/dL — ABNORMAL HIGH (ref 70–99)

## 2023-01-08 LAB — HEMOGLOBIN AND HEMATOCRIT, BLOOD
HCT: 19.1 % — ABNORMAL LOW (ref 39.0–52.0)
Hemoglobin: 6.1 g/dL — CL (ref 13.0–17.0)

## 2023-01-08 LAB — PHOSPHORUS: Phosphorus: 3.7 mg/dL (ref 2.5–4.6)

## 2023-01-08 LAB — PREPARE RBC (CROSSMATCH)

## 2023-01-08 LAB — APTT: aPTT: 35 s (ref 24–36)

## 2023-01-08 LAB — MAGNESIUM: Magnesium: 2 mg/dL (ref 1.7–2.4)

## 2023-01-08 MED ORDER — SODIUM CHLORIDE 0.9 % IV BOLUS
1000.0000 mL | Freq: Once | INTRAVENOUS | Status: AC
  Administered 2023-01-08: 1000 mL via INTRAVENOUS

## 2023-01-08 MED ORDER — SODIUM CHLORIDE 0.9% IV SOLUTION: Freq: Once | INTRAVENOUS | Status: DC

## 2023-01-08 MED ORDER — TRAVASOL 10 % IV SOLN
INTRAVENOUS | Status: AC
  Filled 2023-01-08: qty 1404

## 2023-01-08 MED ORDER — CLOPIDOGREL BISULFATE 75 MG PO TABS
75.0000 mg | ORAL_TABLET | Freq: Every day | ORAL | Status: DC
  Administered 2023-01-08: 75 mg via ORAL
  Filled 2023-01-08 (×2): qty 1

## 2023-01-08 MED ORDER — SODIUM CHLORIDE 0.9 % IV SOLN: INTRAVENOUS | Status: AC

## 2023-01-08 MED ORDER — INSULIN ASPART 100 UNIT/ML IJ SOLN
0.0000 [IU] | Freq: Three times a day (TID) | INTRAMUSCULAR | Status: DC
  Administered 2023-01-08 – 2023-01-09 (×2): 3 [IU] via SUBCUTANEOUS

## 2023-01-08 MED ORDER — ASPIRIN 325 MG PO TABS
325.0000 mg | ORAL_TABLET | Freq: Every day | ORAL | Status: DC
  Administered 2023-01-08: 325 mg via ORAL
  Filled 2023-01-08: qty 1

## 2023-01-08 NOTE — Progress Notes (Addendum)
  Progress Note    01/08/2023 6:52 AM 14 Days Post-Op  Subjective:  continues to feel better.  afebrile  Vitals:   01/07/23 2338 01/08/23 0435  BP: (!) 168/71 (!) 182/73  Pulse: 93 86  Resp:    Temp: 98.3 F (36.8 C) 98.3 F (36.8 C)  SpO2: 98% 96%    Physical Exam: General:  no distress; speech improving daily Lungs:  non labored Extremities:  bilateral feet are warm and well perfused Abdomen:  soft, NT  CBC    Component Value Date/Time   WBC 9.8 01/08/2023 0500   RBC 2.24 (L) 01/08/2023 0500   HGB 6.0 (LL) 01/08/2023 0500   HCT 19.0 (L) 01/08/2023 0500   PLT 196 01/08/2023 0500   MCV 84.8 01/08/2023 0500   MCH 26.8 01/08/2023 0500   MCHC 31.6 01/08/2023 0500   RDW 17.5 (H) 01/08/2023 0500   LYMPHSABS 0.9 10/30/2022 1654   MONOABS 0.6 10/30/2022 1654   EOSABS 0.3 10/30/2022 1654   BASOSABS 0.0 10/30/2022 1654    BMET    Component Value Date/Time   NA 136 01/08/2023 0500   K 3.8 01/08/2023 0500   CL 113 (H) 01/08/2023 0500   CO2 16 (L) 01/08/2023 0500   GLUCOSE 214 (H) 01/08/2023 0500   BUN 102 (H) 01/08/2023 0500   CREATININE 2.24 (H) 01/08/2023 0500   CALCIUM 7.4 (L) 01/08/2023 0500   GFRNONAA 31 (L) 01/08/2023 0500   GFRAA >60 04/14/2015 1127    INR    Component Value Date/Time   INR 1.7 (H) 12/29/2022 0809     Intake/Output Summary (Last 24 hours) at 01/08/2023 2956 Last data filed at 01/08/2023 0437 Gross per 24 hour  Intake 3025.03 ml  Output 3750 ml  Net -724.97 ml      Assessment/Plan:  67 y.o. male is s/p:  SMA stenting and small bowel resection    14 Days Post-Op   -acute blood loss anemia-hgb 6.0 this am down from 7.4 yesterday.  Argatroban continues to be on hold.  Pt is on PR asa.  Discussed with pt that his [facility] is on hold due to bleeding and this puts his stent at risk.  Currently no issues.  Continue to monitor.   -speech improving everyday.     Doreatha Massed, PA-C [facility] [phone] 01/08/2023 6:52 AM  VASCULAR STAFF ADDENDUM: I have independently interviewed and examined the patient. I agree with the above.  Better night, very appreciative of excellent nursing care Hematocrit 6.  Bivalirudin held.  Need to continue aspirin. 2 units PRBCs pending. Will discuss with general surgery.   Victorino Sparrow MD [facility] Phone Number: [phone] 01/08/2023 9:02 AM

## 2023-01-08 NOTE — Progress Notes (Signed)
PHARMACY - TOTAL PARENTERAL NUTRITION CONSULT NOTE  Indication:  SMA syndrome w associated weight loss  Patient Measurements: Height: 5\' 11"  (180.3 cm) Weight: 103.7 kg (228 lb 9.9 oz) IBW/kg (Calculated) : 75.3 TPN AdjBW (KG): 80.4 Body mass index is 31.89 kg/m. Usual Weight: 95 kg, has lost 50lbs recently   Assessment:  67 yo M admitted with abdominal pain, weight loss, and poor PO intake. 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 He is now s/p SMA stent placement. He was initially left is discontinuity with open abdomen and was closed on 9/16. TPN was initiated 9/14.   Pt evaluated by speech therapy 9/29 - was able to tolerate trials of ice chips, thin liquid, and puree w/o overt signs or aspiration or difficulty swallowing.  MBS completed 9/30 - cognitive based oral dysphagia; recommended for trial of full liquids and upgrade as mentation allows.  Glucose / Insulin: A1c 6.3% - CBGs < 150, CBG 214 this AM is outlier; used 9u SSI/24 hours.  Electrolytes: Na/Cl down to 136/113, K 3.8, CO2 down to 16 (max acetate in TPN), CoCa 9.3, Phos 3.7, Mag 2 (s/p 1g Mag sulf) Renal: off CRRT 9/24 >> iHD if needed, none yet - SCr 2.24, BUN 102 >> continuing to trend down off CRRT Hepatic: AST down to 79, ALT down to 198, alk phos down to 96, tbili 0.6, albumin 1.6, TG 88 (wnl) Intake / Output; MIVF: UOP up to 1.5 ml/kg/hr, LBM 9/28. D5 + 42mEq/L @ 175/hr per nephro GI Imaging:  9/20 CTA: improved ostial stenosis post stenting, thrombus/occlusion of SMA/ileocolic artery improving, surgical site has increased dilatation, L lung nodule GI Surgeries / Procedures:  9/14 re-ex lap, left in discontinuity with plans for re-exploration  9/16 re-ex lap, SBR with anastomosis x2, abdominal closure Wounds (aside from abd): Deep tissue pressure injury to B/L buttocks  Central access: CVC LIJ 12/23/22 TPN start date: 12/23/22  Nutritional  Goals: Goal concentrated TPN rate is 80 mL/hr (provides 148 g AA and 2302 kCal per day) Goal un-concentrated TPN is 100 mL/hr (provides 140g AA and 2253 kCal per day)  RD Estimated Needs Total Energy Estimated Needs: 2300-2500 kcals Total Protein Estimated Needs: 140 -150 g Total Fluid Estimated Needs: >/= 2.3 L  Current Nutrition:  NPO and TPN   Plan:  Continue un-concentrated TPN at goal rate of 100 ml/hr to meet 100% of needs Electrolytes in TPN: Na 31mEq/L (added back slightly given Na trend + still provide FW), K 26mEq/L, Ca 4 mEq/L, Mg 5 mEq/L, Phos 3 mmol/L, Cl:Ac - max acetate.  Add standard MVI and trace elements to TPN Adjust resistant SSI to q8 hours  Monitor TPN labs on Mon/Thurs >> daily renal function panel per MD  IVF per nephrology, D5 @ 50/hr - nephro to see and adj as needed F/u diet toleration and PO intake  Rexford Maus, PharmD, BCPS 01/08/2023 8:39 AM

## 2023-01-08 NOTE — Progress Notes (Signed)
PT Cancellation Note  Patient Details Name: [name] [name] MRN: [id number] DOB: October 23, 1955   Cancelled Treatment:    Reason Eval/Treat Not Completed: (P) Medical issues which prohibited therapy (RN reports rectal bleeding and continued low hemoglobin, so therapy held today. Will check back as appropriate.)   Johny Shock 01/08/2023, 2:45 PM

## 2023-01-08 NOTE — Progress Notes (Signed)
Informed by RN that patient had a very large bloody bowel movement with clots.  His blood pressure was previously 129/67 but dropped to 81/47 after he had this bowel movement.  Patient seen and examined at bedside.  Awake and alert, appears pale.  Bedsheet covered with blood.  Denies abdominal pain.  Denies lightheadedness/dizziness, chest pain, shortness of breath.  On exam, bowel sounds present and his abdomen is nontender to palpation.  No crackles appreciated on auscultation of the lungs and satting 100% on room air.  Heart rate in the 80s.  After receiving about 200 mL IV fluids, his blood pressure improved to 109/59 (MAP 73).  Patient's hemoglobin was 6.0 this morning and he was given 1 unit PRBCs, repeat hemoglobin was 6.1 and he was given an additional 1 unit PRBCs.  I have ordered urgent repeat H&H, continue to transfuse PRBCs if hemoglobin less than 7.  Spoke to pharmacy, argatroban was stopped on 9/29 at 10 AM.  However, patient has continued to receive aspirin 325 mg daily.  I have discontinued aspirin for now given GI bleed/concern for hemodynamic instability.  Keep n.p.o. Ordered stat CTA GI bleed study as recommended by GI, may need to involve IR if positive.  I have consulted critical care.  Continue to monitor very closely.

## 2023-01-08 NOTE — Progress Notes (Signed)
Patient ID: [name] [name], male   DOB: July 15, 1955, 67 y.o.   MRN: [id number]  Subjective:  Temporary dialysis catheter was removed on 9/29.  He had 3.8 liters UOP over 9/29.  He has been on D5W at 50 ml/hr.  He was ordered for PRBC's today.  His wife is at bedside.  So is speech therapy.  He is on room air but does get out of breath with a meal.    Review of systems:  Denies chest pain  Denies n/v Shortness of breath observed as above   O:BP (!) 180/62 (BP Location: Right Arm)   Pulse 82   Temp 97.9 F (36.6 C) (Oral)   Resp 20   Ht 5\' 11"  (1.803 m)   Wt 103.7 kg   SpO2 100%   BMI 31.89 kg/m   Intake/Output Summary (Last 24 hours) at 01/08/2023 1202 Last data filed at 01/08/2023 0437 Gross per 24 hour  Intake 3025.03 ml  Output 3200 ml  Net -174.97 ml   Intake/Output: I/O last 3 completed shifts: In: 3028 [I.V.:2928; IV Piggyback:100] Out: 5950 [Urine:5950]  Intake/Output this shift:  No intake/output data recorded. Weight change: 4.2 kg  General adult male in bed in no acute distress HEENT normocephalic atraumatic extraocular movements intact sclera anicteric Neck supple trachea midline Lungs clear to auscultation bilaterally normal work of breathing at rest on room air Heart S1S2 no rub Abdomen soft nontender nondistended Extremities 1+ edema  Psych normal mood and affect Neuro - some word-finding difficulty; oriented to person, recognizes wife GU foley in place with urine   Access no HD access   Recent Labs  Lab 01/01/23 1624 01/02/23 0323 01/03/23 0315 01/04/23 0444 01/05/23 0515 01/05/23 1253 01/06/23 0620 01/06/23 1809 01/07/23 0545 01/08/23 0500  NA 139 137 141 147* 153* 155* 149* 147* 143 136  K 3.7 3.4* 3.3* 3.1* 3.0* 3.0* 3.3* 4.0 4.3 3.8  CL 105 103 108 112* 121* 122* 120* 116* 113* 113*  CO2 23 24 22 22 22 24  20* 17* 17* 16*  GLUCOSE 174* 163* 138* 157* 135* 165* 171* 188* 174* 214*  BUN 69* 64* 100* 114* 116* 116* 104* 100* 97* 102*   CREATININE 2.19* 1.97* 2.77* 2.90* 2.79* 2.70* 2.44* 2.30* 2.34* 2.24*  ALBUMIN 1.7* 1.7* 2.0* 2.0* 1.9*  --   --   --  1.8* 1.6*  CALCIUM 7.0* 7.6* 7.9* 7.9* 8.0* 8.1* 7.8* 7.9* 7.7* 7.4*  PHOS 5.5* 3.5 4.5 4.2 3.9  --  3.4  --  3.4 3.7  AST  --   --   --  214*  --   --   --   --  155* 79*  ALT  --   --   --  221*  --   --   --   --  305* 198*   Liver Function Tests: Recent Labs  Lab 01/04/23 0444 01/05/23 0515 01/07/23 0545 01/08/23 0500  AST 214*  --  155* 79*  ALT 221*  --  305* 198*  ALKPHOS 150*  --  128* 96  BILITOT 1.0  --  0.5 0.6  PROT 5.0*  --  4.7* 4.3*  ALBUMIN 2.0* 1.9* 1.8* 1.6*   CBC: Recent Labs  Lab 01/05/23 1253 01/06/23 0620 01/06/23 1612 01/07/23 0545 01/07/23 1818 01/07/23 2207 01/08/23 0500  WBC 10.2 9.7  --  10.1 13.9*  --  9.8  HGB 9.3* 8.9*   < > 8.1* 7.3* 7.4* 6.0*  HCT 29.0* 28.0*   < >  25.9* 22.0* 22.6* 19.0*  MCV 86.1 85.1  --  84.9 84.3  --  84.8  PLT 246 245  --  235 226  --  196   < > = values in this interval not displayed.   Cardiac Enzymes: No results for input(s): "CKTOTAL", "CKMB", "CKMBINDEX", "TROPONINI" in the last 168 hours. CBG: Recent Labs  Lab 01/07/23 1159 01/07/23 1751 01/08/23 0003 01/08/23 0554 01/08/23 1131  GLUCAP 134* 133* 125* 120* 130*    Studies/Results: DG Swallowing Func-Speech Pathology  Result Date: 01/08/2023 Table formatting from the original result was not included. Modified Barium Swallow Study Patient Details Name: [name] [name] MRN: [id number] Date of Birth: 23-Dec-1955 Today's Date: 01/08/2023 HPI/PMH: HPI: Pt is a 67 yo male presenting to ED from [facility] vascular surgery office with known mesenteric artery stenosis s/p SMA angiogram. Admitted 9/11 with several week history of abdominal pain with 30 lb weight loss. W/u included mesenteric ischemia and necrotic area of jejunum s/p exploratory laparotomy and small bowel resection 9/12, reopening of laparotomy 9/14, second reopening and small bowel resection  with anastomosis x2 9/26. Intubated 9/12-9/22. PMH includes testicular cancer, HTN, retinal detachment, GERD, HTN, HLD Clinical Impression: Clinical Impression: Pt presents with a suspected cognitively based oral dysphagia, although his swallow is overall functional. Suspect the taste of the barium contributed to oral holding and gagging, although oral holding of purees was also noted at bedside. Only one instance of trace, transient penetration occurred as pt was attempting to swallow the pill with thin liquids and seemingly gagged. He exhibited great airway protection throughout this event and no further penetration/aspiration was observed throughout trials of nectar or honey thick liquids, purees, and solids. Pt required frequent breaks and had increased WOB at times. Given pt's fatigue and cognition, recommend initiating a diet of full liquids with good potential to upgrade as mentation improves. He will require total assistance for feeding. Question pt's ability to attend throughout the course of a meal and achieve adequate oral intake at this time, therefore, he may require continued TPN until adequate intake is achieved. Will continue to follow to target diet toleration and upgrade as able. Factors that may increase risk of adverse event in presence of aspiration Rubye Oaks & Clearance Coots 2021): Factors that may increase risk of adverse event in presence of aspiration Rubye Oaks & Clearance Coots 2021): Respiratory or GI disease; Reduced cognitive function; Frail or deconditioned; Dependence for feeding and/or oral hygiene Recommendations/Plan: Swallowing Evaluation Recommendations Swallowing Evaluation Recommendations Recommendations: PO diet PO Diet Recommendation: Full liquid diet Liquid Administration via: Spoon; Cup; Straw Medication Administration: Whole meds with puree Supervision: Full assist for feeding; Full supervision/cueing for swallowing strategies Swallowing strategies  : Minimize environmental distractions; Slow  rate; Small bites/sips Postural changes: Position pt fully upright for meals Oral care recommendations: Oral care BID (2x/day); Staff/trained caregiver to provide oral care Treatment Plan Treatment Plan Treatment recommendations: Therapy as outlined in treatment plan below Follow-up recommendations: Acute inpatient rehab (3 hours/day) Functional status assessment: Patient has had a recent decline in their functional status and demonstrates the ability to make significant improvements in function in a reasonable and predictable amount of time. Treatment frequency: Min 2x/week Treatment duration: 2 weeks Interventions: Aspiration precaution training; Oropharyngeal exercises; Patient/family education; Trials of upgraded texture/liquids; Diet toleration management by SLP Recommendations Recommendations for follow up therapy are one component of a multi-disciplinary discharge planning process, led by the attending physician.  Recommendations may be updated based on patient status, additional functional criteria and insurance authorization. Assessment:  25.9* 22.0* 22.6* 19.0*  MCV 86.1 85.1  --  84.9 84.3  --  84.8  PLT 246 245  --  235 226  --  196   < > = values in this interval not displayed.   Cardiac Enzymes: No results for input(s): "CKTOTAL", "CKMB", "CKMBINDEX", "TROPONINI" in the last 168 hours. CBG: Recent Labs  Lab 01/07/23 1159 01/07/23 1751 01/08/23 0003 01/08/23 0554 01/08/23 1131  GLUCAP 134* 133* 125* 120* 130*    Studies/Results: DG Swallowing Func-Speech Pathology  Result Date: 01/08/2023 Table formatting from the original result was not included. Modified Barium Swallow Study Patient Details Name: [name] [name] MRN: [id number] Date of Birth: 23-Dec-1955 Today's Date: 01/08/2023 HPI/PMH: HPI: Pt is a 67 yo male presenting to ED from [facility] vascular surgery office with known mesenteric artery stenosis s/p SMA angiogram. Admitted 9/11 with several week history of abdominal pain with 30 lb weight loss. W/u included mesenteric ischemia and necrotic area of jejunum s/p exploratory laparotomy and small bowel resection 9/12, reopening of laparotomy 9/14, second reopening and small bowel resection  with anastomosis x2 9/26. Intubated 9/12-9/22. PMH includes testicular cancer, HTN, retinal detachment, GERD, HTN, HLD Clinical Impression: Clinical Impression: Pt presents with a suspected cognitively based oral dysphagia, although his swallow is overall functional. Suspect the taste of the barium contributed to oral holding and gagging, although oral holding of purees was also noted at bedside. Only one instance of trace, transient penetration occurred as pt was attempting to swallow the pill with thin liquids and seemingly gagged. He exhibited great airway protection throughout this event and no further penetration/aspiration was observed throughout trials of nectar or honey thick liquids, purees, and solids. Pt required frequent breaks and had increased WOB at times. Given pt's fatigue and cognition, recommend initiating a diet of full liquids with good potential to upgrade as mentation improves. He will require total assistance for feeding. Question pt's ability to attend throughout the course of a meal and achieve adequate oral intake at this time, therefore, he may require continued TPN until adequate intake is achieved. Will continue to follow to target diet toleration and upgrade as able. Factors that may increase risk of adverse event in presence of aspiration Rubye Oaks & Clearance Coots 2021): Factors that may increase risk of adverse event in presence of aspiration Rubye Oaks & Clearance Coots 2021): Respiratory or GI disease; Reduced cognitive function; Frail or deconditioned; Dependence for feeding and/or oral hygiene Recommendations/Plan: Swallowing Evaluation Recommendations Swallowing Evaluation Recommendations Recommendations: PO diet PO Diet Recommendation: Full liquid diet Liquid Administration via: Spoon; Cup; Straw Medication Administration: Whole meds with puree Supervision: Full assist for feeding; Full supervision/cueing for swallowing strategies Swallowing strategies  : Minimize environmental distractions; Slow  rate; Small bites/sips Postural changes: Position pt fully upright for meals Oral care recommendations: Oral care BID (2x/day); Staff/trained caregiver to provide oral care Treatment Plan Treatment Plan Treatment recommendations: Therapy as outlined in treatment plan below Follow-up recommendations: Acute inpatient rehab (3 hours/day) Functional status assessment: Patient has had a recent decline in their functional status and demonstrates the ability to make significant improvements in function in a reasonable and predictable amount of time. Treatment frequency: Min 2x/week Treatment duration: 2 weeks Interventions: Aspiration precaution training; Oropharyngeal exercises; Patient/family education; Trials of upgraded texture/liquids; Diet toleration management by SLP Recommendations Recommendations for follow up therapy are one component of a multi-disciplinary discharge planning process, led by the attending physician.  Recommendations may be updated based on patient status, additional functional criteria and insurance authorization. Assessment:  25.9* 22.0* 22.6* 19.0*  MCV 86.1 85.1  --  84.9 84.3  --  84.8  PLT 246 245  --  235 226  --  196   < > = values in this interval not displayed.   Cardiac Enzymes: No results for input(s): "CKTOTAL", "CKMB", "CKMBINDEX", "TROPONINI" in the last 168 hours. CBG: Recent Labs  Lab 01/07/23 1159 01/07/23 1751 01/08/23 0003 01/08/23 0554 01/08/23 1131  GLUCAP 134* 133* 125* 120* 130*    Studies/Results: DG Swallowing Func-Speech Pathology  Result Date: 01/08/2023 Table formatting from the original result was not included. Modified Barium Swallow Study Patient Details Name: [name] [name] MRN: [id number] Date of Birth: 23-Dec-1955 Today's Date: 01/08/2023 HPI/PMH: HPI: Pt is a 67 yo male presenting to ED from [facility] vascular surgery office with known mesenteric artery stenosis s/p SMA angiogram. Admitted 9/11 with several week history of abdominal pain with 30 lb weight loss. W/u included mesenteric ischemia and necrotic area of jejunum s/p exploratory laparotomy and small bowel resection 9/12, reopening of laparotomy 9/14, second reopening and small bowel resection  with anastomosis x2 9/26. Intubated 9/12-9/22. PMH includes testicular cancer, HTN, retinal detachment, GERD, HTN, HLD Clinical Impression: Clinical Impression: Pt presents with a suspected cognitively based oral dysphagia, although his swallow is overall functional. Suspect the taste of the barium contributed to oral holding and gagging, although oral holding of purees was also noted at bedside. Only one instance of trace, transient penetration occurred as pt was attempting to swallow the pill with thin liquids and seemingly gagged. He exhibited great airway protection throughout this event and no further penetration/aspiration was observed throughout trials of nectar or honey thick liquids, purees, and solids. Pt required frequent breaks and had increased WOB at times. Given pt's fatigue and cognition, recommend initiating a diet of full liquids with good potential to upgrade as mentation improves. He will require total assistance for feeding. Question pt's ability to attend throughout the course of a meal and achieve adequate oral intake at this time, therefore, he may require continued TPN until adequate intake is achieved. Will continue to follow to target diet toleration and upgrade as able. Factors that may increase risk of adverse event in presence of aspiration Rubye Oaks & Clearance Coots 2021): Factors that may increase risk of adverse event in presence of aspiration Rubye Oaks & Clearance Coots 2021): Respiratory or GI disease; Reduced cognitive function; Frail or deconditioned; Dependence for feeding and/or oral hygiene Recommendations/Plan: Swallowing Evaluation Recommendations Swallowing Evaluation Recommendations Recommendations: PO diet PO Diet Recommendation: Full liquid diet Liquid Administration via: Spoon; Cup; Straw Medication Administration: Whole meds with puree Supervision: Full assist for feeding; Full supervision/cueing for swallowing strategies Swallowing strategies  : Minimize environmental distractions; Slow  rate; Small bites/sips Postural changes: Position pt fully upright for meals Oral care recommendations: Oral care BID (2x/day); Staff/trained caregiver to provide oral care Treatment Plan Treatment Plan Treatment recommendations: Therapy as outlined in treatment plan below Follow-up recommendations: Acute inpatient rehab (3 hours/day) Functional status assessment: Patient has had a recent decline in their functional status and demonstrates the ability to make significant improvements in function in a reasonable and predictable amount of time. Treatment frequency: Min 2x/week Treatment duration: 2 weeks Interventions: Aspiration precaution training; Oropharyngeal exercises; Patient/family education; Trials of upgraded texture/liquids; Diet toleration management by SLP Recommendations Recommendations for follow up therapy are one component of a multi-disciplinary discharge planning process, led by the attending physician.  Recommendations may be updated based on patient status, additional functional criteria and insurance authorization. Assessment:  Patient ID: [name] [name], male   DOB: July 15, 1955, 67 y.o.   MRN: [id number]  Subjective:  Temporary dialysis catheter was removed on 9/29.  He had 3.8 liters UOP over 9/29.  He has been on D5W at 50 ml/hr.  He was ordered for PRBC's today.  His wife is at bedside.  So is speech therapy.  He is on room air but does get out of breath with a meal.    Review of systems:  Denies chest pain  Denies n/v Shortness of breath observed as above   O:BP (!) 180/62 (BP Location: Right Arm)   Pulse 82   Temp 97.9 F (36.6 C) (Oral)   Resp 20   Ht 5\' 11"  (1.803 m)   Wt 103.7 kg   SpO2 100%   BMI 31.89 kg/m   Intake/Output Summary (Last 24 hours) at 01/08/2023 1202 Last data filed at 01/08/2023 0437 Gross per 24 hour  Intake 3025.03 ml  Output 3200 ml  Net -174.97 ml   Intake/Output: I/O last 3 completed shifts: In: 3028 [I.V.:2928; IV Piggyback:100] Out: 5950 [Urine:5950]  Intake/Output this shift:  No intake/output data recorded. Weight change: 4.2 kg  General adult male in bed in no acute distress HEENT normocephalic atraumatic extraocular movements intact sclera anicteric Neck supple trachea midline Lungs clear to auscultation bilaterally normal work of breathing at rest on room air Heart S1S2 no rub Abdomen soft nontender nondistended Extremities 1+ edema  Psych normal mood and affect Neuro - some word-finding difficulty; oriented to person, recognizes wife GU foley in place with urine   Access no HD access   Recent Labs  Lab 01/01/23 1624 01/02/23 0323 01/03/23 0315 01/04/23 0444 01/05/23 0515 01/05/23 1253 01/06/23 0620 01/06/23 1809 01/07/23 0545 01/08/23 0500  NA 139 137 141 147* 153* 155* 149* 147* 143 136  K 3.7 3.4* 3.3* 3.1* 3.0* 3.0* 3.3* 4.0 4.3 3.8  CL 105 103 108 112* 121* 122* 120* 116* 113* 113*  CO2 23 24 22 22 22 24  20* 17* 17* 16*  GLUCOSE 174* 163* 138* 157* 135* 165* 171* 188* 174* 214*  BUN 69* 64* 100* 114* 116* 116* 104* 100* 97* 102*   CREATININE 2.19* 1.97* 2.77* 2.90* 2.79* 2.70* 2.44* 2.30* 2.34* 2.24*  ALBUMIN 1.7* 1.7* 2.0* 2.0* 1.9*  --   --   --  1.8* 1.6*  CALCIUM 7.0* 7.6* 7.9* 7.9* 8.0* 8.1* 7.8* 7.9* 7.7* 7.4*  PHOS 5.5* 3.5 4.5 4.2 3.9  --  3.4  --  3.4 3.7  AST  --   --   --  214*  --   --   --   --  155* 79*  ALT  --   --   --  221*  --   --   --   --  305* 198*   Liver Function Tests: Recent Labs  Lab 01/04/23 0444 01/05/23 0515 01/07/23 0545 01/08/23 0500  AST 214*  --  155* 79*  ALT 221*  --  305* 198*  ALKPHOS 150*  --  128* 96  BILITOT 1.0  --  0.5 0.6  PROT 5.0*  --  4.7* 4.3*  ALBUMIN 2.0* 1.9* 1.8* 1.6*   CBC: Recent Labs  Lab 01/05/23 1253 01/06/23 0620 01/06/23 1612 01/07/23 0545 01/07/23 1818 01/07/23 2207 01/08/23 0500  WBC 10.2 9.7  --  10.1 13.9*  --  9.8  HGB 9.3* 8.9*   < > 8.1* 7.3* 7.4* 6.0*  HCT 29.0* 28.0*   < >

## 2023-01-08 NOTE — Consult Note (Signed)
Consultation  Primary Care Physician:  [name], MD Primary Gastroenterologist:  Dr. Rhea Belton       Reason for Consultation:  DOA: 12/20/2022         Hospital Day: 20         HPI:   [name] [name] is a 67 y.o. male with past medical history significant for hypertension, hyperlipidemia, GERD, known mesenteric artery stenosis having previously undergone SMA angiogram at the time of gastrointestinal stromal tumor removal in 2022.    Presented to the ER from [facility] on 9/11 for complaints of ongoing upper abdominal pain and a 30 pound weight loss. He had full workup done prior and they found a vascular blockage.    CT showed SMA thrombus, as well as plaque in the SMA. On 9/12 patient was taken urgently by general surgery and vascular surgery exploratory lap with small bowel resection in addition to percutaneous section of thumb ectomy to the SMA, lumen angioplasty of the SMA and stenting.    Patient returned to the operating room on 9/14 for another exploratory lap and evaluation of SMA with evidence of ischemic but not frankly necrotic bowel.   Patient returned to the operating room for third time on 9/16 for another exploratory lap with small bowel resection with anastomosis x 2.    Patient during hospitalization has developed AKI and nephrology has been consulted.  He required intermediate hemodialysis.    General surgery removed NG tube on 9/27 with plans to advance diet pending speech therapy evaluation. Concern for melena with evidence of hematochezia confirmed by positive fecal occult.  GI has been consulted to evaluate.  Hemoglobin 6.1.  Yesterday was 7.4.  Unit of blood being transfused.     Patient lying in bed resting, wife at bedside. Pt reports he is doing fair. Tolerated clear liquid diet today. No abdominal pain, nausea or vomiting. Unable to report blood in stool, requested I ask his nurse.Per RN patient had two episodes today where he passed bright red blood  with clots. We discussed possible endoscopic evaluation in future if warranted and they are not too interested in any invasive procedures at this time.   Previous GI workup:   12/29/2022 CTA ABDOMEN AND PELVIS WITHOUT AND WITH CONTRAST   IMPRESSION: VASCULAR   Interval placement of the stent along the SMA with improved ostial stenosis. In addition the area of thrombus or vascular occlusion along the secondary branch of the SMA, ileocolic artery, appears to be improved today with more enhancement and flow in this location.   Significant calcified plaque elsewhere throughout the abdomen and pelvis. Once again there is significant calcified plaque along the origin of the renal arteries. Please correlate for patient's level of hypertension and renal function.   Occlusion of the right external iliac artery with distal reconstitution.   No areas of active extravasation of contrast at this time. There is some high density luminal material in the colon which was present on the precontrast dataset.   NON-VASCULAR   Interval postsurgical changes with some bowel resection along the sigmoid colon left midabdomen. Loops of bowel in this location demonstrate increased dilatation, ill-defined margins and wall thickening. In addition on the delayed dataset the level of wall enhancement is less than usually seen nor expected. No pneumatosis or portal venous gas. Of note the vasculature throughout the abdomen pelvis appears more atretic. Please correlate for any history including evidence of vaso spasm.   Scattered free  air or free fluid which could be postsurgical.   Mild bilateral basilar lung atelectasis and ground-glass. No consolidation. At most trace pleural fluid.   4 mm left-sided lung nodule. If patient is low risk for malignancy, no routine follow-up imaging is recommended. If patient is high risk for malignancy, a non-contrast chest CT at 12 months is optional.This recommendation  follows the consensus statement: Guidelines for Management of Incidental Pulmonary Nodules Detected on CT Images: From the Fleischner Society 2017; Radiology 2017; [phone].  [facility] STUDIES:   Small bowel enteroscopy 04/07/21 Findings: The esophagus was normal. The stomach was normal. The examined duodenum was normal. There was no evidence of significant pathology in the  entire examined portion of jejunum. Estimated Blood Loss: Estimated blood loss: none. Impression: - Normal esophagus. - Normal stomach. - Normal examined duodenum. - The examined portion of the jejunum was normal. - No specimens collected. Recommendation: - Perform a colonoscopy today.  Colonoscopy 04/07/21: Findings: Hemorrhoids were found on perianal exam. A few medium-mouthed diverticula were found in the  sigmoid colon and transverse colon. flecs old blood was found in the transverse colon. There was a medium-sized lipoma (positive pillow  sign), in the proximal transverse colon. Internal hemorrhoids were found during retroflexion. The exam was otherwise without abnormality. Estimated Blood Loss: Estimated blood loss: none. Impression: - Hemorrhoids found on perianal exam. - Diverticulosis in the sigmoid colon and in the  transverse colon. - flecs old Blood seen in the transverse colon. - Medium-sized lipoma in the proximal transverse colon. - Internal hemorrhoids. - The examination was otherwise normal. - No specimens collected. no definite source of blood loss identified. Recommendation: - To visualize the rest of the small bowel, perform  video capsule endoscopy today. results Push enteroscopy and colonoscopy communicated  to in patient team  Video capsule endoscopy 04/08/21: Findings: The video capsule had previously been placed into the  stomach endoscopically, so no images from the  esophagus are available. small erythematous area mid to distal jejunum most  likely related to [facility]  enteroscopy done the same day. Impression: no source of bleeding seen. No bleeding seen. Recommendation: in view of continuing bleeding and Hx drop in  hemoglobin. If bleeding or rebleeds actively; do CT  angiography. Discussed with in patient team. If still bleeding and no source found with CTA,  consider double balloon enteroscopy     12/25 VCE: Gastritis, gastric erosions, gastric ulcer versus duodenal ulcer.  Capsule retained at duodenum but eventually passed.  Blood noted at 2 hours, 40 minutes into the small bowel.  Fast transit with blood products throughout the small bowel not allowing for adequate visualization of the majority of the small bowel.  Large volume blood products seen in colon   04/04/2021 SMALL BOWEL ENDOSCOPY with Dr. Meridee Score for Abnormal video capsule endoscopy, Hematochezia, Recurrent gastrointestinal bleeding, Recurrent bleeding in the small bowel  Impression:  - No gross lesions in esophagus. Non- obstructing Schatzki ring. Z- line regular, 40 cm from the incisors.  - 2 cm hiatal hernia.  - Gastritis. Biopsied.  - Multiple non- bleeding duodenal ulcers with a clean ulcer base in the duodenal bulb region ( Forrest Class III) . Biopsied.  - Duodenal deformity at the D1/ D2 angle/ sweep.  - Normal mucosa was found in the second portion of the duodenum, in the third portion of the duodenum and in the fourth portion of the duodenum.  - Normal mucosa was found in the proximal jejunum. Tattooed.  04/04/2021 CT angio: Hyperemia  and potential intraluminal contrast extravasation at mid jejunum in the left mid hemiabdomen, likely supplied via tertiary branches of SMA.  SMA is heavily diseased with suspected areas of hemodynamically significant narrowing of major branches and divisions.  Large amount of atherosclerotic plaque but normal caliber abdominal aorta.  Suspicion of hemodynamically significant narrowings of renal arteries bilaterally and atrophy in right kidney  likely vascular in etiology.  Colonic diverticulosis, no diverticulitis.   Had colonoscopy 11/2019 for surveillance of history of previous polyps.  6 polyps removed, some of these were tubular adenomas.     The patient's prior abdominal surgeries include a laparoscopic cholecystectomy and a resection of a small bowel GIST in 2022.    Abnormal ED labs: Abnormal Labs Reviewed  CBC - Abnormal; Notable for the following components:      Result Value   WBC 12.8 (*)    MCV 78.4 (*)    MCH 25.1 (*)    All other components within normal limits  COMPREHENSIVE METABOLIC PANEL - Abnormal; Notable for the following components:   Glucose, Bld 126 (*)    BUN 36 (*)    Creatinine, Ser 3.00 (*)    Calcium 8.6 (*)    Albumin 3.1 (*)    AST 74 (*)    ALT 105 (*)    GFR, Estimated 22 (*)    All other components within normal limits  HEPARIN LEVEL (UNFRACTIONATED) - Abnormal; Notable for the following components:   Heparin Unfractionated <0.10 (*)    All other components within normal limits  CBC - Abnormal; Notable for the following components:   WBC 13.6 (*)    MCH 25.9 (*)    All other components within normal limits  MAGNESIUM - Abnormal; Notable for the following components:   Magnesium 1.6 (*)    All other components within normal limits  COMPREHENSIVE METABOLIC PANEL - Abnormal; Notable for the following components:   Potassium 3.2 (*)    Glucose, Bld 134 (*)    BUN 37 (*)    Creatinine, Ser 2.90 (*)    Calcium 8.6 (*)    Total Protein 5.9 (*)    Albumin 2.8 (*)    AST 65 (*)    ALT 95 (*)    GFR, Estimated 23 (*)    All other components within normal limits  HEPARIN LEVEL (UNFRACTIONATED) - Abnormal; Notable for the following components:   Heparin Unfractionated 0.14 (*)    All other components within normal limits  CBC - Abnormal; Notable for the following components:   WBC 14.1 (*)    Hemoglobin 11.7 (*)    HCT 37.0 (*)    MCV 78.2 (*)    MCH 24.7 (*)    All other  components within normal limits  HEPARIN LEVEL (UNFRACTIONATED) - Abnormal; Notable for the following components:   Heparin Unfractionated 0.13 (*)    All other components within normal limits  BASIC METABOLIC PANEL - Abnormal; Notable for the following components:   Potassium 3.3 (*)    Glucose, Bld 104 (*)    BUN 37 (*)    Creatinine, Ser 2.89 (*)    Calcium 8.1 (*)    GFR, Estimated 23 (*)    All other components within normal limits  COMPREHENSIVE METABOLIC PANEL - Abnormal; Notable for the following components:   Glucose, Bld 141 (*)    BUN 36 (*)    Creatinine, Ser 3.32 (*)    Calcium 8.0 (*)    Total Protein 5.6 (*)  Albumin 2.3 (*)    AST 45 (*)    ALT 69 (*)    GFR, Estimated 19 (*)    All other components within normal limits  BASIC METABOLIC PANEL - Abnormal; Notable for the following components:   Potassium 3.2 (*)    CO2 21 (*)    Glucose, Bld 128 (*)    BUN 34 (*)    Creatinine, Ser 2.90 (*)    Calcium 7.7 (*)    GFR, Estimated 23 (*)    All other components within normal limits  MAGNESIUM - Abnormal; Notable for the following components:   Magnesium 1.4 (*)    All other components within normal limits  CBC - Abnormal; Notable for the following components:   WBC 16.6 (*)    RBC 4.18 (*)    Hemoglobin 10.5 (*)    HCT 33.1 (*)    MCV 79.2 (*)    MCH 25.1 (*)    All other components within normal limits  CBC - Abnormal; Notable for the following components:   WBC 17.4 (*)    Hemoglobin 10.9 (*)    HCT 34.5 (*)    MCV 79.5 (*)    MCH 25.1 (*)    All other components within normal limits  GLUCOSE, CAPILLARY - Abnormal; Notable for the following components:   Glucose-Capillary 121 (*)    All other components within normal limits  HEPARIN LEVEL (UNFRACTIONATED) - Abnormal; Notable for the following components:   Heparin Unfractionated 0.22 (*)    All other components within normal limits  HEPARIN LEVEL (UNFRACTIONATED) - Abnormal; Notable for the  following components:   Heparin Unfractionated 0.13 (*)    All other components within normal limits  CBC - Abnormal; Notable for the following components:   RBC 3.75 (*)    Hemoglobin 9.6 (*)    HCT 30.1 (*)    MCH 25.6 (*)    All other components within normal limits  BASIC METABOLIC PANEL - Abnormal; Notable for the following components:   Glucose, Bld 125 (*)    BUN 46 (*)    Creatinine, Ser 4.99 (*)    Calcium 7.5 (*)    GFR, Estimated 12 (*)    All other components within normal limits  PHOSPHORUS - Abnormal; Notable for the following components:   Phosphorus 5.3 (*)    All other components within normal limits  GLUCOSE, CAPILLARY - Abnormal; Notable for the following components:   Glucose-Capillary 115 (*)    All other components within normal limits  HEPARIN LEVEL (UNFRACTIONATED) - Abnormal; Notable for the following components:   Heparin Unfractionated 0.10 (*)    All other components within normal limits  GLUCOSE, CAPILLARY - Abnormal; Notable for the following components:   Glucose-Capillary 114 (*)    All other components within normal limits  GLUCOSE, CAPILLARY - Abnormal; Notable for the following components:   Glucose-Capillary 134 (*)    All other components within normal limits  CBC - Abnormal; Notable for the following components:   RBC 3.63 (*)    Hemoglobin 9.2 (*)    HCT 28.9 (*)    MCV 79.6 (*)    MCH 25.3 (*)    All other components within normal limits  BASIC METABOLIC PANEL - Abnormal; Notable for the following components:   CO2 21 (*)    Glucose, Bld 126 (*)    BUN 57 (*)    Creatinine, Ser 6.02 (*)    Calcium 7.6 (*)  GFR, Estimated 10 (*)    All other components within normal limits  PHOSPHORUS - Abnormal; Notable for the following components:   Phosphorus 5.8 (*)    All other components within normal limits  GLUCOSE, CAPILLARY - Abnormal; Notable for the following components:   Glucose-Capillary 143 (*)    All other components within  normal limits  GLUCOSE, CAPILLARY - Abnormal; Notable for the following components:   Glucose-Capillary 134 (*)    All other components within normal limits  HEPARIN LEVEL (UNFRACTIONATED) - Abnormal; Notable for the following components:   Heparin Unfractionated <0.10 (*)    All other components within normal limits  GLUCOSE, CAPILLARY - Abnormal; Notable for the following components:   Glucose-Capillary 118 (*)    All other components within normal limits  GLUCOSE, CAPILLARY - Abnormal; Notable for the following components:   Glucose-Capillary 131 (*)    All other components within normal limits  HEPARIN LEVEL (UNFRACTIONATED) - Abnormal; Notable for the following components:   Heparin Unfractionated <0.10 (*)    All other components within normal limits  GLUCOSE, CAPILLARY - Abnormal; Notable for the following components:   Glucose-Capillary 106 (*)    All other components within normal limits  GLUCOSE, CAPILLARY - Abnormal; Notable for the following components:   Glucose-Capillary 107 (*)    All other components within normal limits  CBC - Abnormal; Notable for the following components:   RBC 3.61 (*)    Hemoglobin 9.4 (*)    HCT 29.0 (*)    All other components within normal limits  PHOSPHORUS - Abnormal; Notable for the following components:   Phosphorus 6.0 (*)    All other components within normal limits  COMPREHENSIVE METABOLIC PANEL - Abnormal; Notable for the following components:   Glucose, Bld 123 (*)    BUN 72 (*)    Creatinine, Ser 6.97 (*)    Calcium 7.9 (*)    Total Protein 5.0 (*)    Albumin 1.8 (*)    AST 79 (*)    GFR, Estimated 8 (*)    All other components within normal limits  COMPREHENSIVE METABOLIC PANEL - Abnormal; Notable for the following components:   CO2 21 (*)    Glucose, Bld 127 (*)    BUN 66 (*)    Creatinine, Ser 6.87 (*)    Calcium 7.8 (*)    Total Protein 4.6 (*)    Albumin 1.8 (*)    AST 72 (*)    GFR, Estimated 8 (*)    All other  components within normal limits  GLUCOSE, CAPILLARY - Abnormal; Notable for the following components:   Glucose-Capillary 123 (*)    All other components within normal limits  HEPARIN LEVEL (UNFRACTIONATED) - Abnormal; Notable for the following components:   Heparin Unfractionated <0.10 (*)    All other components within normal limits  GLUCOSE, CAPILLARY - Abnormal; Notable for the following components:   Glucose-Capillary 102 (*)    All other components within normal limits  HEPARIN LEVEL (UNFRACTIONATED) - Abnormal; Notable for the following components:   Heparin Unfractionated <0.10 (*)    All other components within normal limits  GLUCOSE, CAPILLARY - Abnormal; Notable for the following components:   Glucose-Capillary 105 (*)    All other components within normal limits  CBC - Abnormal; Notable for the following components:   WBC 12.0 (*)    Hemoglobin 10.5 (*)    HCT 34.9 (*)    MCH 24.5 (*)    All  other components within normal limits  APTT - Abnormal; Notable for the following components:   aPTT 63 (*)    All other components within normal limits  CBC - Abnormal; Notable for the following components:   WBC 14.6 (*)    RBC 3.78 (*)    Hemoglobin 9.4 (*)    HCT 30.3 (*)    MCH 24.9 (*)    Platelets 149 (*)    All other components within normal limits  BASIC METABOLIC PANEL - Abnormal; Notable for the following components:   CO2 21 (*)    Glucose, Bld 153 (*)    BUN 86 (*)    Creatinine, Ser 7.10 (*)    Calcium 8.0 (*)    GFR, Estimated 8 (*)    All other components within normal limits  PHOSPHORUS - Abnormal; Notable for the following components:   Phosphorus 6.0 (*)    All other components within normal limits  GLUCOSE, CAPILLARY - Abnormal; Notable for the following components:   Glucose-Capillary 130 (*)    All other components within normal limits  GLUCOSE, CAPILLARY - Abnormal; Notable for the following components:   Glucose-Capillary 133 (*)    All other  components within normal limits  GLUCOSE, CAPILLARY - Abnormal; Notable for the following components:   Glucose-Capillary 160 (*)    All other components within normal limits  GLUCOSE, CAPILLARY - Abnormal; Notable for the following components:   Glucose-Capillary 154 (*)    All other components within normal limits  APTT - Abnormal; Notable for the following components:   aPTT 74 (*)    All other components within normal limits  APTT - Abnormal; Notable for the following components:   aPTT 80 (*)    All other components within normal limits  GLUCOSE, CAPILLARY - Abnormal; Notable for the following components:   Glucose-Capillary 105 (*)    All other components within normal limits  GLUCOSE, CAPILLARY - Abnormal; Notable for the following components:   Glucose-Capillary 124 (*)    All other components within normal limits  APTT - Abnormal; Notable for the following components:   aPTT 77 (*)    All other components within normal limits  GLUCOSE, CAPILLARY - Abnormal; Notable for the following components:   Glucose-Capillary 141 (*)    All other components within normal limits  CBC - Abnormal; Notable for the following components:   WBC 15.5 (*)    RBC 3.83 (*)    Hemoglobin 9.8 (*)    HCT 30.8 (*)    MCH 25.6 (*)    All other components within normal limits  BASIC METABOLIC PANEL - Abnormal; Notable for the following components:   CO2 21 (*)    Glucose, Bld 157 (*)    BUN 110 (*)    Creatinine, Ser 7.05 (*)    Calcium 8.3 (*)    GFR, Estimated 8 (*)    Anion gap 16 (*)    All other components within normal limits  APTT - Abnormal; Notable for the following components:   aPTT 70 (*)    All other components within normal limits  APTT - Abnormal; Notable for the following components:   aPTT 73 (*)    All other components within normal limits  GLUCOSE, CAPILLARY - Abnormal; Notable for the following components:   Glucose-Capillary 128 (*)    All other components within  normal limits  GLUCOSE, CAPILLARY - Abnormal; Notable for the following components:   Glucose-Capillary 142 (*)  All other components within normal limits  GLUCOSE, CAPILLARY - Abnormal; Notable for the following components:   Glucose-Capillary 152 (*)    All other components within normal limits  APTT - Abnormal; Notable for the following components:   aPTT 67 (*)    All other components within normal limits  GLUCOSE, CAPILLARY - Abnormal; Notable for the following components:   Glucose-Capillary 149 (*)    All other components within normal limits  HEPATITIS B SURFACE ANTIBODY, QUANTITATIVE - Abnormal; Notable for the following components:   Hep B S AB Quant (Post) <3.5 (*)    All other components within normal limits  RENAL FUNCTION PANEL - Abnormal; Notable for the following components:   Potassium 3.4 (*)    Glucose, Bld 162 (*)    BUN 116 (*)    Creatinine, Ser 7.07 (*)    Calcium 8.2 (*)    Albumin 1.7 (*)    GFR, Estimated 8 (*)    All other components within normal limits  CBC - Abnormal; Notable for the following components:   WBC 13.5 (*)    RBC 3.62 (*)    Hemoglobin 9.1 (*)    HCT 28.7 (*)    MCV 79.3 (*)    MCH 25.1 (*)    All other components within normal limits  GLUCOSE, CAPILLARY - Abnormal; Notable for the following components:   Glucose-Capillary 146 (*)    All other components within normal limits  GLUCOSE, CAPILLARY - Abnormal; Notable for the following components:   Glucose-Capillary 148 (*)    All other components within normal limits  COMPREHENSIVE METABOLIC PANEL - Abnormal; Notable for the following components:   Potassium 3.0 (*)    Glucose, Bld 162 (*)    BUN 104 (*)    Creatinine, Ser 5.77 (*)    Calcium 8.0 (*)    Total Protein 5.1 (*)    Albumin 1.6 (*)    AST 46 (*)    GFR, Estimated 10 (*)    All other components within normal limits  APTT - Abnormal; Notable for the following components:   aPTT 63 (*)    All other components  within normal limits  GLUCOSE, CAPILLARY - Abnormal; Notable for the following components:   Glucose-Capillary 157 (*)    All other components within normal limits  GLUCOSE, CAPILLARY - Abnormal; Notable for the following components:   Glucose-Capillary 150 (*)    All other components within normal limits  GLUCOSE, CAPILLARY - Abnormal; Notable for the following components:   Glucose-Capillary 158 (*)    All other components within normal limits  CBC - Abnormal; Notable for the following components:   WBC 12.5 (*)    RBC 3.46 (*)    Hemoglobin 8.9 (*)    HCT 27.3 (*)    MCV 78.9 (*)    MCH 25.7 (*)    All other components within normal limits  GLUCOSE, CAPILLARY - Abnormal; Notable for the following components:   Glucose-Capillary 155 (*)    All other components within normal limits  BASIC METABOLIC PANEL - Abnormal; Notable for the following components:   Glucose, Bld 172 (*)    BUN 117 (*)    Creatinine, Ser 5.94 (*)    Calcium 8.7 (*)    GFR, Estimated 10 (*)    All other components within normal limits  GLUCOSE, CAPILLARY - Abnormal; Notable for the following components:   Glucose-Capillary 169 (*)    All other components within normal limits  APTT - Abnormal; Notable for the following components:   aPTT 89 (*)    All other components within normal limits  RENAL FUNCTION PANEL - Abnormal; Notable for the following components:   Chloride 96 (*)    Glucose, Bld 247 (*)    BUN 84 (*)    Creatinine, Ser 4.17 (*)    Calcium 8.0 (*)    Albumin 1.8 (*)    GFR, Estimated 15 (*)    Anion gap 16 (*)    All other components within normal limits  GLUCOSE, CAPILLARY - Abnormal; Notable for the following components:   Glucose-Capillary 164 (*)    All other components within normal limits  GLUCOSE, CAPILLARY - Abnormal; Notable for the following components:   Glucose-Capillary 162 (*)    All other components within normal limits  GLUCOSE, CAPILLARY - Abnormal; Notable for the  following components:   Glucose-Capillary 168 (*)    All other components within normal limits  CBC - Abnormal; Notable for the following components:   WBC 28.1 (*)    RBC 3.04 (*)    Hemoglobin 7.8 (*)    HCT 24.6 (*)    MCH 25.7 (*)    All other components within normal limits  APTT - Abnormal; Notable for the following components:   aPTT 47 (*)    All other components within normal limits  PROTIME-INR - Abnormal; Notable for the following components:   Prothrombin Time 20.1 (*)    INR 1.7 (*)    All other components within normal limits  HEMOGLOBIN A1C - Abnormal; Notable for the following components:   Hgb A1c MFr Bld 6.3 (*)    All other components within normal limits  RENAL FUNCTION PANEL - Abnormal; Notable for the following components:   Chloride 96 (*)    Glucose, Bld 215 (*)    BUN 85 (*)    Creatinine, Ser 3.57 (*)    Calcium 7.9 (*)    Albumin 1.7 (*)    GFR, Estimated 18 (*)    All other components within normal limits  CBC - Abnormal; Notable for the following components:   WBC 26.1 (*)    RBC 3.32 (*)    Hemoglobin 8.9 (*)    HCT 27.1 (*)    All other components within normal limits  CBC - Abnormal; Notable for the following components:   WBC 31.7 (*)    RBC 3.60 (*)    Hemoglobin 9.3 (*)    HCT 28.9 (*)    MCH 25.8 (*)    All other components within normal limits  GLUCOSE, CAPILLARY - Abnormal; Notable for the following components:   Glucose-Capillary 187 (*)    All other components within normal limits  CBC - Abnormal; Notable for the following components:   WBC 29.8 (*)    RBC 2.82 (*)    Hemoglobin 7.5 (*)    HCT 23.2 (*)    All other components within normal limits  APTT - Abnormal; Notable for the following components:   aPTT 42 (*)    All other components within normal limits  RENAL FUNCTION PANEL - Abnormal; Notable for the following components:   Sodium 134 (*)    Chloride 96 (*)    Glucose, Bld 227 (*)    BUN 78 (*)    Creatinine,  Ser 2.91 (*)    Calcium 7.9 (*)    Albumin 1.8 (*)    GFR, Estimated 23 (*)    All other  components within normal limits  CBC - Abnormal; Notable for the following components:   WBC 33.0 (*)    RBC 3.25 (*)    Hemoglobin 9.0 (*)    HCT 26.8 (*)    RDW 15.7 (*)    All other components within normal limits  RENAL FUNCTION PANEL - Abnormal; Notable for the following components:   Glucose, Bld 206 (*)    BUN 76 (*)    Creatinine, Ser 2.62 (*)    Calcium 8.0 (*)    Albumin 1.8 (*)    GFR, Estimated 26 (*)    All other components within normal limits  GLUCOSE, CAPILLARY - Abnormal; Notable for the following components:   Glucose-Capillary 218 (*)    All other components within normal limits  GLUCOSE, CAPILLARY - Abnormal; Notable for the following components:   Glucose-Capillary 197 (*)    All other components within normal limits  CBC - Abnormal; Notable for the following components:   WBC 30.1 (*)    RBC 3.28 (*)    Hemoglobin 8.9 (*)    HCT 27.5 (*)    RDW 15.6 (*)    All other components within normal limits  GLUCOSE, CAPILLARY - Abnormal; Notable for the following components:   Glucose-Capillary 179 (*)    All other components within normal limits  GLUCOSE, CAPILLARY - Abnormal; Notable for the following components:   Glucose-Capillary 172 (*)    All other components within normal limits  APTT - Abnormal; Notable for the following components:   aPTT 44 (*)    All other components within normal limits  MAGNESIUM - Abnormal; Notable for the following components:   Magnesium 2.5 (*)    All other components within normal limits  CBC - Abnormal; Notable for the following components:   WBC 26.9 (*)    RBC 2.91 (*)    Hemoglobin 7.9 (*)    HCT 24.6 (*)    RDW 15.8 (*)    All other components within normal limits  GLUCOSE, CAPILLARY - Abnormal; Notable for the following components:   Glucose-Capillary 200 (*)    All other components within normal limits  GLUCOSE,  CAPILLARY - Abnormal; Notable for the following components:   Glucose-Capillary 176 (*)    All other components within normal limits  GLUCOSE, CAPILLARY - Abnormal; Notable for the following components:   Glucose-Capillary 223 (*)    All other components within normal limits  RENAL FUNCTION PANEL - Abnormal; Notable for the following components:   Glucose, Bld 128 (*)    BUN 74 (*)    Creatinine, Ser 2.08 (*)    Calcium 7.6 (*)    Phosphorus 1.7 (*)    Albumin 1.7 (*)    GFR, Estimated 34 (*)    All other components within normal limits  GLUCOSE, CAPILLARY - Abnormal; Notable for the following components:   Glucose-Capillary 183 (*)    All other components within normal limits  RENAL FUNCTION PANEL - Abnormal; Notable for the following components:   Sodium 134 (*)    Glucose, Bld 236 (*)    BUN 75 (*)    Creatinine, Ser 2.36 (*)    Calcium 7.7 (*)    Albumin 1.7 (*)    GFR, Estimated 29 (*)    All other components within normal limits  TRIGLYCERIDES - Abnormal; Notable for the following components:   Triglycerides 160 (*)    All other components within normal limits  GLUCOSE, CAPILLARY - Abnormal; Notable for  the following components:   Glucose-Capillary 183 (*)    All other components within normal limits  CBC - Abnormal; Notable for the following components:   WBC 27.0 (*)    RBC 2.90 (*)    Hemoglobin 7.7 (*)    HCT 23.8 (*)    RDW 15.9 (*)    All other components within normal limits  GLUCOSE, CAPILLARY - Abnormal; Notable for the following components:   Glucose-Capillary 133 (*)    All other components within normal limits  GLUCOSE, CAPILLARY - Abnormal; Notable for the following components:   Glucose-Capillary 111 (*)    All other components within normal limits  COMPREHENSIVE METABOLIC PANEL - Abnormal; Notable for the following components:   Glucose, Bld 230 (*)    BUN 75 (*)    Creatinine, Ser 2.07 (*)    Calcium 7.2 (*)    Total Protein 4.6 (*)    Albumin  1.6 (*)    AST 221 (*)    ALT 209 (*)    Total Bilirubin 1.4 (*)    GFR, Estimated 34 (*)    All other components within normal limits  PHOSPHORUS - Abnormal; Notable for the following components:   Phosphorus 2.1 (*)    All other components within normal limits  APTT - Abnormal; Notable for the following components:   aPTT 46 (*)    All other components within normal limits  CBC - Abnormal; Notable for the following components:   WBC 21.1 (*)    RBC 3.39 (*)    Hemoglobin 9.2 (*)    HCT 28.7 (*)    RDW 16.3 (*)    All other components within normal limits  RENAL FUNCTION PANEL - Abnormal; Notable for the following components:   Glucose, Bld 174 (*)    BUN 69 (*)    Creatinine, Ser 2.19 (*)    Calcium 7.0 (*)    Phosphorus 5.5 (*)    Albumin 1.7 (*)    GFR, Estimated 32 (*)    All other components within normal limits  GLUCOSE, CAPILLARY - Abnormal; Notable for the following components:   Glucose-Capillary 125 (*)    All other components within normal limits  GLUCOSE, CAPILLARY - Abnormal; Notable for the following components:   Glucose-Capillary 147 (*)    All other components within normal limits  GLUCOSE, CAPILLARY - Abnormal; Notable for the following components:   Glucose-Capillary 142 (*)    All other components within normal limits  APTT - Abnormal; Notable for the following components:   aPTT 62 (*)    All other components within normal limits  RENAL FUNCTION PANEL - Abnormal; Notable for the following components:   Potassium 3.4 (*)    Glucose, Bld 163 (*)    BUN 64 (*)    Creatinine, Ser 1.97 (*)    Calcium 7.6 (*)    Albumin 1.7 (*)    GFR, Estimated 36 (*)    All other components within normal limits  CBC - Abnormal; Notable for the following components:   WBC 21.2 (*)    RBC 2.93 (*)    Hemoglobin 7.8 (*)    HCT 24.2 (*)    RDW 16.6 (*)    Platelets 111 (*)    All other components within normal limits  CBC - Abnormal; Notable for the following  components:   WBC 23.4 (*)    RBC 2.98 (*)    Hemoglobin 8.0 (*)    HCT 24.9 (*)  RDW 16.5 (*)    All other components within normal limits  GLUCOSE, CAPILLARY - Abnormal; Notable for the following components:   Glucose-Capillary 148 (*)    All other components within normal limits  GLUCOSE, CAPILLARY - Abnormal; Notable for the following components:   Glucose-Capillary 157 (*)    All other components within normal limits  GLUCOSE, CAPILLARY - Abnormal; Notable for the following components:   Glucose-Capillary 157 (*)    All other components within normal limits  GLUCOSE, CAPILLARY - Abnormal; Notable for the following components:   Glucose-Capillary 158 (*)    All other components within normal limits  GLUCOSE, CAPILLARY - Abnormal; Notable for the following components:   Glucose-Capillary 155 (*)    All other components within normal limits  GLUCOSE, CAPILLARY - Abnormal; Notable for the following components:   Glucose-Capillary 189 (*)    All other components within normal limits  GLUCOSE, CAPILLARY - Abnormal; Notable for the following components:   Glucose-Capillary 208 (*)    All other components within normal limits  GLUCOSE, CAPILLARY - Abnormal; Notable for the following components:   Glucose-Capillary 212 (*)    All other components within normal limits  GLUCOSE, CAPILLARY - Abnormal; Notable for the following components:   Glucose-Capillary 138 (*)    All other components within normal limits  GLUCOSE, CAPILLARY - Abnormal; Notable for the following components:   Glucose-Capillary 162 (*)    All other components within normal limits  GLUCOSE, CAPILLARY - Abnormal; Notable for the following components:   Glucose-Capillary 224 (*)    All other components within normal limits  GLUCOSE, CAPILLARY - Abnormal; Notable for the following components:   Glucose-Capillary 149 (*)    All other components within normal limits  GLUCOSE, CAPILLARY - Abnormal; Notable for the  following components:   Glucose-Capillary 148 (*)    All other components within normal limits  APTT - Abnormal; Notable for the following components:   aPTT 62 (*)    All other components within normal limits  MAGNESIUM - Abnormal; Notable for the following components:   Magnesium 2.7 (*)    All other components within normal limits  GLUCOSE, CAPILLARY - Abnormal; Notable for the following components:   Glucose-Capillary 298 (*)    All other components within normal limits  GLUCOSE, CAPILLARY - Abnormal; Notable for the following components:   Glucose-Capillary 140 (*)    All other components within normal limits  RENAL FUNCTION PANEL - Abnormal; Notable for the following components:   Potassium 3.3 (*)    Glucose, Bld 138 (*)    BUN 100 (*)    Creatinine, Ser 2.77 (*)    Calcium 7.9 (*)    Albumin 2.0 (*)    GFR, Estimated 24 (*)    All other components within normal limits  CBC - Abnormal; Notable for the following components:   WBC 15.4 (*)    RBC 2.40 (*)    Hemoglobin 6.4 (*)    HCT 19.9 (*)    RDW 17.3 (*)    Platelets 140 (*)    All other components within normal limits  GLUCOSE, CAPILLARY - Abnormal; Notable for the following components:   Glucose-Capillary 119 (*)    All other components within normal limits  GLUCOSE, CAPILLARY - Abnormal; Notable for the following components:   Glucose-Capillary 146 (*)    All other components within normal limits  CBC - Abnormal; Notable for the following components:   WBC 15.4 (*)  RBC 3.23 (*)    Hemoglobin 8.5 (*)    HCT 26.2 (*)    RDW 17.1 (*)    All other components within normal limits  GLUCOSE, CAPILLARY - Abnormal; Notable for the following components:   Glucose-Capillary 152 (*)    All other components within normal limits  GLUCOSE, CAPILLARY - Abnormal; Notable for the following components:   Glucose-Capillary 132 (*)    All other components within normal limits  COMPREHENSIVE METABOLIC PANEL - Abnormal;  Notable for the following components:   Sodium 147 (*)    Potassium 3.1 (*)    Chloride 112 (*)    Glucose, Bld 157 (*)    BUN 114 (*)    Creatinine, Ser 2.90 (*)    Calcium 7.9 (*)    Total Protein 5.0 (*)    Albumin 2.0 (*)    AST 214 (*)    ALT 221 (*)    Alkaline Phosphatase 150 (*)    GFR, Estimated 23 (*)    All other components within normal limits  APTT - Abnormal; Notable for the following components:   aPTT 52 (*)    All other components within normal limits  CBC - Abnormal; Notable for the following components:   WBC 14.4 (*)    RBC 3.24 (*)    Hemoglobin 9.1 (*)    HCT 26.8 (*)    RDW 17.5 (*)    All other components within normal limits  GLUCOSE, CAPILLARY - Abnormal; Notable for the following components:   Glucose-Capillary 177 (*)    All other components within normal limits  GLUCOSE, CAPILLARY - Abnormal; Notable for the following components:   Glucose-Capillary 129 (*)    All other components within normal limits  GLUCOSE, CAPILLARY - Abnormal; Notable for the following components:   Glucose-Capillary 135 (*)    All other components within normal limits  GLUCOSE, CAPILLARY - Abnormal; Notable for the following components:   Glucose-Capillary 129 (*)    All other components within normal limits  GLUCOSE, CAPILLARY - Abnormal; Notable for the following components:   Glucose-Capillary 145 (*)    All other components within normal limits  GLUCOSE, CAPILLARY - Abnormal; Notable for the following components:   Glucose-Capillary 143 (*)    All other components within normal limits  GLUCOSE, CAPILLARY - Abnormal; Notable for the following components:   Glucose-Capillary 147 (*)    All other components within normal limits  GLUCOSE, CAPILLARY - Abnormal; Notable for the following components:   Glucose-Capillary 133 (*)    All other components within normal limits  GLUCOSE, CAPILLARY - Abnormal; Notable for the following components:   Glucose-Capillary 127 (*)     All other components within normal limits  RENAL FUNCTION PANEL - Abnormal; Notable for the following components:   Sodium 153 (*)    Potassium 3.0 (*)    Chloride 121 (*)    Glucose, Bld 135 (*)    BUN 116 (*)    Creatinine, Ser 2.79 (*)    Calcium 8.0 (*)    Albumin 1.9 (*)    GFR, Estimated 24 (*)    All other components within normal limits  BASIC METABOLIC PANEL - Abnormal; Notable for the following components:   Sodium 155 (*)    Potassium 3.0 (*)    Chloride 122 (*)    Glucose, Bld 165 (*)    BUN 116 (*)    Creatinine, Ser 2.70 (*)    Calcium 8.1 (*)  GFR, Estimated 25 (*)    All other components within normal limits  GLUCOSE, CAPILLARY - Abnormal; Notable for the following components:   Glucose-Capillary 131 (*)    All other components within normal limits  APTT - Abnormal; Notable for the following components:   aPTT 50 (*)    All other components within normal limits  CBC - Abnormal; Notable for the following components:   RBC 3.37 (*)    Hemoglobin 9.3 (*)    HCT 29.0 (*)    RDW 18.2 (*)    All other components within normal limits  GLUCOSE, CAPILLARY - Abnormal; Notable for the following components:   Glucose-Capillary 126 (*)    All other components within normal limits  APTT - Abnormal; Notable for the following components:   aPTT 50 (*)    All other components within normal limits  MAGNESIUM - Abnormal; Notable for the following components:   Magnesium 1.5 (*)    All other components within normal limits  BASIC METABOLIC PANEL - Abnormal; Notable for the following components:   Sodium 149 (*)    Potassium 3.3 (*)    Chloride 120 (*)    CO2 20 (*)    Glucose, Bld 171 (*)    BUN 104 (*)    Creatinine, Ser 2.44 (*)    Calcium 7.8 (*)    GFR, Estimated 28 (*)    All other components within normal limits  GLUCOSE, CAPILLARY - Abnormal; Notable for the following components:   Glucose-Capillary 144 (*)    All other components within normal limits   GLUCOSE, CAPILLARY - Abnormal; Notable for the following components:   Glucose-Capillary 140 (*)    All other components within normal limits  GLUCOSE, CAPILLARY - Abnormal; Notable for the following components:   Glucose-Capillary 141 (*)    All other components within normal limits  GLUCOSE, CAPILLARY - Abnormal; Notable for the following components:   Glucose-Capillary 126 (*)    All other components within normal limits  CBC - Abnormal; Notable for the following components:   RBC 3.29 (*)    Hemoglobin 8.9 (*)    HCT 28.0 (*)    RDW 18.2 (*)    All other components within normal limits  OCCULT BLOOD X 1 CARD TO LAB, STOOL - Abnormal; Notable for the following components:   Fecal Occult Bld POSITIVE (*)    All other components within normal limits  HEMOGLOBIN AND HEMATOCRIT, BLOOD - Abnormal; Notable for the following components:   Hemoglobin 9.1 (*)    HCT 29.2 (*)    All other components within normal limits  GLUCOSE, CAPILLARY - Abnormal; Notable for the following components:   Glucose-Capillary 143 (*)    All other components within normal limits  BASIC METABOLIC PANEL - Abnormal; Notable for the following components:   Sodium 147 (*)    Chloride 116 (*)    CO2 17 (*)    Glucose, Bld 188 (*)    BUN 100 (*)    Creatinine, Ser 2.30 (*)    Calcium 7.9 (*)    GFR, Estimated 30 (*)    All other components within normal limits  APTT - Abnormal; Notable for the following components:   aPTT 56 (*)    All other components within normal limits  BASIC METABOLIC PANEL - Abnormal; Notable for the following components:   Chloride 113 (*)    CO2 17 (*)    Glucose, Bld 174 (*)    BUN 97 (*)  Creatinine, Ser 2.34 (*)    Calcium 7.7 (*)    GFR, Estimated 30 (*)    All other components within normal limits  CBC - Abnormal; Notable for the following components:   RBC 3.05 (*)    Hemoglobin 8.1 (*)    HCT 25.9 (*)    RDW 18.0 (*)    All other components within normal limits   HEPATIC FUNCTION PANEL - Abnormal; Notable for the following components:   Total Protein 4.7 (*)    Albumin 1.8 (*)    AST 155 (*)    ALT 305 (*)    Alkaline Phosphatase 128 (*)    All other components within normal limits  HEMOGLOBIN AND HEMATOCRIT, BLOOD - Abnormal; Notable for the following components:   Hemoglobin 8.9 (*)    HCT 28.0 (*)    All other components within normal limits  GLUCOSE, CAPILLARY - Abnormal; Notable for the following components:   Glucose-Capillary 123 (*)    All other components within normal limits  GLUCOSE, CAPILLARY - Abnormal; Notable for the following components:   Glucose-Capillary 150 (*)    All other components within normal limits  GLUCOSE, CAPILLARY - Abnormal; Notable for the following components:   Glucose-Capillary 137 (*)    All other components within normal limits  GLUCOSE, CAPILLARY - Abnormal; Notable for the following components:   Glucose-Capillary 121 (*)    All other components within normal limits  HEMOGLOBIN AND HEMATOCRIT, BLOOD - Abnormal; Notable for the following components:   Hemoglobin 7.4 (*)    HCT 22.6 (*)    All other components within normal limits  CBC - Abnormal; Notable for the following components:   WBC 13.9 (*)    RBC 2.61 (*)    Hemoglobin 7.3 (*)    HCT 22.0 (*)    RDW 17.5 (*)    All other components within normal limits  GLUCOSE, CAPILLARY - Abnormal; Notable for the following components:   Glucose-Capillary 134 (*)    All other components within normal limits  COMPREHENSIVE METABOLIC PANEL - Abnormal; Notable for the following components:   Chloride 113 (*)    CO2 16 (*)    Glucose, Bld 214 (*)    BUN 102 (*)    Creatinine, Ser 2.24 (*)    Calcium 7.4 (*)    Total Protein 4.3 (*)    Albumin 1.6 (*)    AST 79 (*)    ALT 198 (*)    GFR, Estimated 31 (*)    All other components within normal limits  CBC - Abnormal; Notable for the following components:   RBC 2.24 (*)    Hemoglobin 6.0 (*)     HCT 19.0 (*)    RDW 17.5 (*)    All other components within normal limits  GLUCOSE, CAPILLARY - Abnormal; Notable for the following components:   Glucose-Capillary 133 (*)    All other components within normal limits  HEMOGLOBIN AND HEMATOCRIT, BLOOD - Abnormal; Notable for the following components:   Hemoglobin 6.1 (*)    HCT 19.1 (*)    All other components within normal limits  GLUCOSE, CAPILLARY - Abnormal; Notable for the following components:   Glucose-Capillary 125 (*)    All other components within normal limits  GLUCOSE, CAPILLARY - Abnormal; Notable for the following components:   Glucose-Capillary 120 (*)    All other components within normal limits  GLUCOSE, CAPILLARY - Abnormal; Notable for the following components:   Glucose-Capillary 130 (*)  All other components within normal limits  POCT I-STAT 7, (LYTES, BLD GAS, ICA,H+H) - Abnormal; Notable for the following components:   pH, Arterial 7.334 (*)    pO2, Arterial 123 (*)    Acid-base deficit 3.0 (*)    Potassium 3.4 (*)    Calcium, Ion 1.08 (*)    HCT 31.0 (*)    Hemoglobin 10.5 (*)    All other components within normal limits  POCT I-STAT 7, (LYTES, BLD GAS, ICA,H+H) - Abnormal; Notable for the following components:   pO2, Arterial 128 (*)    Calcium, Ion 1.13 (*)    HCT 33.0 (*)    Hemoglobin 11.2 (*)    All other components within normal limits  POCT I-STAT 7, (LYTES, BLD GAS, ICA,H+H) - Abnormal; Notable for the following components:   pH, Arterial 7.475 (*)    pO2, Arterial 180 (*)    Calcium, Ion 1.06 (*)    HCT 29.0 (*)    Hemoglobin 9.9 (*)    All other components within normal limits  POCT I-STAT EG7 - Abnormal; Notable for the following components:   pCO2, Ven 40.1 (*)    Potassium 3.4 (*)    HCT 23.0 (*)    Hemoglobin 7.8 (*)    All other components within normal limits    Past Medical History:  Diagnosis Date   Ankle fracture    Cancer (HCC) 1974   testicular cancer-at age 89    Cataract    bilateral sx   GERD (gastroesophageal reflux disease)    hx of   Hx of colonic polyps    pt unsure,thinks this was done in 1982 in [place]. no reports in EPIC   Hyperlipidemia    on meds   Hypertension    on meds   Kidney stones    Patella fracture    Retinal detachment    OD   Retinal tear of right eye    Syncopal episodes 2008    Surgical History:  He  has a past surgical history that includes Knee arthroscopy (Right); Hydrocele surgery; Orchiectomy; Colonoscopy (02/16/2020); Tonsillectomy; Vitrectomy (Right, 06/16/2014); Laser photo ablation (Right, 06/16/2014); Air/fluid exchange (Right, 06/16/2014); Gas insertion (Right, 06/16/2014); Repair of complex traction retinal detachment (Right, 06/16/2014); Cataract extraction, bilateral (2016); Eye surgery; Retinal detachment surgery (06/2014); Cataract extraction; Cataract extraction (09/2014); Wisdom tooth extraction; Givens capsule study (N/A, 04/02/2021); IR US Guide Vasc Access Left (04/04/2021); IR Angiogram Visceral Selective (04/04/2021); enteroscopy (N/A, 04/04/2021); Submucosal tattoo injection (04/04/2021); biopsy (04/04/2021); Insertion of iliac stent (N/A, 12/21/2022); Ultrasound guidance for vascular access (Left, 12/21/2022); Angioplasty (Left, 12/21/2022); visceral angiogram (Left, 12/21/2022); laparotomy (N/A, 12/21/2022); Application if wound vac (N/A, 12/21/2022); Bowel resection (12/21/2022); laparotomy (N/A, 12/23/2022); laparotomy (N/A, 12/25/2022); and Bowel resection (N/A, 12/25/2022). Family History:  His family history includes Arrhythmia in his father; Coronary artery disease in an other family member; Hypertension in an other family member; Parkinson's disease in his mother; Sudden death in an other family member. Social History:   reports that he has never smoked. He has never used smokeless tobacco. He reports that he does not currently use alcohol. He reports that he does not use drugs.  Prior to Admission  medications   Medication Sig Start Date End Date Taking? Authorizing Provider  chlorthalidone (HYGROTON) 25 MG tablet Take 12.5 mg by mouth every morning. 11/17/22  Yes [provider]  cloNIDine (CATAPRES) 0.2 MG tablet Take 1 tablet (0.2 mg total) by mouth 2 (two) times daily. 11/21/22  Yes  [name], MD  hydrALAZINE (APRESOLINE) 50 MG tablet Take 1 tablet (50 mg total) by mouth 3 (three) times daily. 11/21/22  Yes [name], MD  HYDROcodone-acetaminophen (NORCO) 10-325 MG tablet Take 1 tablet by mouth every 6 (six) hours as needed for moderate pain. 12/06/22  Yes [name], MD  losartan (COZAAR) 100 MG tablet Take 1 tablet (100 mg total) by mouth daily. 11/20/22  Yes [name], MD  ondansetron (ZOFRAN-ODT) 4 MG disintegrating tablet Take 4 mg by mouth every 8 (eight) hours as needed for vomiting or nausea. 11/30/22  Yes [provider]  rosuvastatin (CRESTOR) 40 MG tablet TAKE 1 TABLET BY MOUTH EVERY DAY 11/02/22  Yes [name], MD  sildenafil (VIAGRA) 100 MG tablet Take 1 tablet (100 mg total) by mouth as needed for erectile dysfunction. 07/11/22  Yes [name], MD  Syringe/Needle, Disp, (SYRINGE 3CC/22GX1-1/2") 22G X 1-1/2" 3 ML MISC 1 Application by Does not apply route every 14 (fourteen) days. 07/13/22   [name], MD  testosterone cypionate (DEPOTESTOSTERONE CYPIONATE) 200 MG/ML injection Inject 1 mL (200 mg total) into the muscle every 14 (fourteen) days. Patient not taking: Reported on 12/20/2022 08/29/22   [name], MD    Current Facility-Administered Medications  Medication Dose Route Frequency Provider Last Rate Last Admin   0.9 %  sodium chloride infusion (Manually program via Guardrails IV Fluids)   Intravenous Once Lorin Glass, MD       0.9 %  sodium chloride infusion (Manually program via Guardrails IV Fluids)   Intravenous Once John Giovanni, MD       0.9 %  sodium chloride infusion   Intravenous Continuous Estanislado Emms, MD  75 mL/hr at 01/08/23 1327 New Bag at 01/08/23 1327   albumin human 5 % solution 12.5 g  12.5 g Intravenous PRN Lorin Glass, MD   Stopped at 01/01/23 1020   alteplase (CATHFLO ACTIVASE) injection 2 mg  2 mg Intracatheter Once PRN Anthony Sar, MD       amiodarone (NEXTERONE PREMIX) 360-4.14 MG/200ML-% (1.8 mg/mL) IV infusion  30 mg/hr Intravenous Continuous Lorin Glass, MD 16.67 mL/hr at 01/08/23 0448 30 mg/hr at 01/08/23 0448   anticoagulant sodium citrate solution 5 mL  5 mL Intracatheter PRN Anthony Sar, MD   5 mL at 01/02/23 0358   aspirin tablet 325 mg  325 mg Oral Daily Juliet Rude, PA-C       bisacodyl (DULCOLAX) suppository 10 mg  10 mg Rectal Daily PRN Lorin Glass, MD   10 mg at 12/29/22 1243   Chlorhexidine Gluconate Cloth 2 % PADS 6 each  6 each Topical Daily Barnetta Chapel, PA-C   6 each at 01/08/23 1322   haloperidol lactate (HALDOL) injection 2 mg  2 mg Intravenous Q6H PRN Lorin Glass, MD   2 mg at 01/01/23 2031   heparin injection 1,000 Units  1,000 Units Intracatheter PRN Anthony Sar, MD   2,400 Units at 12/27/22 1814   hydrALAZINE (APRESOLINE) injection 20 mg  20 mg Intravenous Q4H PRN Lanier Clam, NP   20 mg at 01/06/23 1609   HYDROmorphone (DILAUDID) injection 0.5-2 mg  0.5-2 mg Intravenous Q3H PRN Lorin Glass, MD   1 mg at 01/04/23 1535   insulin aspart (novoLOG) injection 0-20 Units  0-20 Units Subcutaneous Q8H Rexford Maus, RPH   3 Units at 01/08/23 1323   lidocaine (PF) (XYLOCAINE) 1 % injection  5 mL  5 mL Intradermal PRN Anthony Sar, MD       lidocaine-prilocaine (EMLA) cream 1 Application  1 Application Topical PRN Anthony Sar, MD       Oral care mouth rinse  15 mL Mouth Rinse PRN Lorin Glass, MD       pantoprazole (PROTONIX) injection 40 mg  40 mg Intravenous Q12H Lorin Glass, MD   40 mg at 01/08/23 4401   pentafluoroprop-tetrafluoroeth (GEBAUERS) aerosol 1 Application  1 Application Topical PRN Anthony Sar, MD       sodium  chloride flush (NS) 0.9 % injection 3 mL  3 mL Intravenous Q12H Barnetta Chapel, PA-C   3 mL at 01/08/23 0272   TPN ADULT (ION)   Intravenous Continuous TPN Doristine Counter, RPH 100 mL/hr at 01/07/23 1751 New Bag at 01/07/23 1751   TPN ADULT (ION)   Intravenous Continuous TPN Rexford Maus, RPH        Allergies as of 12/20/2022 - Review Complete 12/20/2022  Allergen Reaction Noted   Zestril [lisinopril] Cough 10/31/2022    Review of Systems:    Constitutional: No weight loss, fever, chills, weakness or fatigue HEENT: Eyes: No change in vision               Ears, Nose, Throat:  No change in hearing or congestion Skin: No rash or itching Cardiovascular: No chest pain, chest pressure or palpitations   Respiratory: No SOB or cough Gastrointestinal: See HPI and otherwise negative Genitourinary: No dysuria or change in urinary frequency Neurological: No headache, dizziness or syncope Musculoskeletal: No new muscle or joint pain Hematologic: No bleeding or bruising Psychiatric: No history of depression or anxiety     Physical Exam:  Vital signs in last 24 hours: Temp:  [97.9 F (36.6 C)-98.4 F (36.9 C)] 98.4 F (36.9 C) (09/30 1345) Pulse Rate:  [80-97] 93 (09/30 1345) Resp:  [20] 20 (09/30 1345) BP: (134-182)/(59-73) 134/73 (09/30 1345) SpO2:  [96 %-100 %] 98 % (09/30 1345) Weight:  [103.7 kg] 103.7 kg (09/30 0500) Last BM Date : 01/08/23 Last BM recorded by nurses in past 5 days Stool Type: Type 7 (Liquid consistency with no solid pieces) (01/07/2023  9:52 PM)  General:   Pleasant, male in no acute distress Head:  Normocephalic and atraumatic. Eyes: sclerae anicteric,conjunctive pink  Heart:  regular rate and rhythm Pulm: diminished bilaterally; no wheezing, RA Abdomen:  soft, Non-distended AB, Hypoactive bowel sounds. No tenderness    .Without guarding and Without rebound, No organomegaly appreciated. Dressing noted to Midline, C/D/I. Extremities:  With +2 bilateral  edema. Msk:  Symmetrical without gross deformities. Peripheral pulses intact.  Neurologic:  Alert and  oriented x4;  No focal deficits.  Skin:   Dry and intact without significant lesions or rashes. Palor noted Psychiatric:  Cooperative. Normal mood and affect.  LAB RESULTS: Recent Labs    01/07/23 0545 01/07/23 1818 01/07/23 2207 01/08/23 0500 01/08/23 1252  WBC 10.1 13.9*  --  9.8  --   HGB 8.1* 7.3* 7.4* 6.0* 6.1*  HCT 25.9* 22.0* 22.6* 19.0* 19.1*  PLT 235 226  --  196  --    BMET Recent Labs    01/06/23 1809 01/07/23 0545 01/08/23 0500  NA 147* 143 136  K 4.0 4.3 3.8  CL 116* 113* 113*  CO2 17* 17* 16*  GLUCOSE 188* 174* 214*  BUN 100* 97* 102*  CREATININE 2.30* 2.34* 2.24*  CALCIUM 7.9* 7.7* 7.4*   LFT  Recent Labs    01/07/23 0545 01/08/23 0500  PROT 4.7* 4.3*  ALBUMIN 1.8* 1.6*  AST 155* 79*  ALT 305* 198*  ALKPHOS 128* 96  BILITOT 0.5 0.6  BILIDIR 0.2  --   IBILI 0.3  --    PT/INR No results for input(s): "LABPROT", "INR" in the last 72 hours.  STUDIES: DG Swallowing Func-Speech Pathology  Result Date: 01/08/2023 Table formatting from the original result was not included. Modified Barium Swallow Study Patient Details Name: SPIKE EKIS MRN: [id number] Date of Birth: 09/17/1955 Today's Date: 01/08/2023 HPI/PMH: HPI: Pt is a 67 yo male presenting to ED from OP vascular surgery office with known mesenteric artery stenosis s/p SMA angiogram. Admitted 9/11 with several week history of abdominal pain with 30 lb weight loss. W/u included mesenteric ischemia and necrotic area of jejunum s/p exploratory laparotomy and small bowel resection 9/12, reopening of laparotomy 9/14, second reopening and small bowel resection with anastomosis x2 9/26. Intubated 9/12-9/22. PMH includes testicular cancer, HTN, retinal detachment, GERD, HTN, HLD Clinical Impression: Clinical Impression: Pt presents with a suspected cognitively based oral dysphagia, although his swallow is  overall functional. Suspect the taste of the barium contributed to oral holding and gagging, although oral holding of purees was also noted at bedside. Only one instance of trace, transient penetration occurred as pt was attempting to swallow the pill with thin liquids and seemingly gagged. He exhibited great airway protection throughout this event and no further penetration/aspiration was observed throughout trials of nectar or honey thick liquids, purees, and solids. Pt required frequent breaks and had increased WOB at times. Given pt's fatigue and cognition, recommend initiating a diet of full liquids with good potential to upgrade as mentation improves. He will require total assistance for feeding. Question pt's ability to attend throughout the course of a meal and achieve adequate oral intake at this time, therefore, he Kester Stimpson require continued TPN until adequate intake is achieved. Will continue to follow to target diet toleration and upgrade as able. Factors that Jadon Harbaugh increase risk of adverse event in presence of aspiration Rubye Oaks [employer] Clearance Coots 2021): Factors that Kara Mierzejewski increase risk of adverse event in presence of aspiration Rubye Oaks [employer] Clearance Coots 2021): Respiratory or GI disease; Reduced cognitive function; Frail or deconditioned; Dependence for feeding and/or oral hygiene Recommendations/Plan: Swallowing Evaluation Recommendations Swallowing Evaluation Recommendations Recommendations: PO diet PO Diet Recommendation: Full liquid diet Liquid Administration via: Spoon; Cup; Straw Medication Administration: Whole meds with puree Supervision: Full assist for feeding; Full supervision/cueing for swallowing strategies Swallowing strategies  : Minimize environmental distractions; Slow rate; Small bites/sips Postural changes: Position pt fully upright for meals Oral care recommendations: Oral care BID (2x/day); Staff/trained caregiver to provide oral care Treatment Plan Treatment Plan Treatment recommendations: Therapy as  outlined in treatment plan below Follow-up recommendations: Acute inpatient rehab (3 hours/day) Functional status assessment: Patient has had a recent decline in their functional status and demonstrates the ability to make significant improvements in function in a reasonable and predictable amount of time. Treatment frequency: Min 2x/week Treatment duration: 2 weeks Interventions: Aspiration precaution training; Oropharyngeal exercises; Patient/family education; Trials of upgraded texture/liquids; Diet toleration management by SLP Recommendations Recommendations for follow up therapy are one component of a multi-disciplinary discharge planning process, led by the attending physician.  Recommendations Lundynn Cohoon be updated based on patient status, additional functional criteria and insurance authorization. Assessment: Orofacial Exam: Orofacial Exam Oral Cavity: Oral Hygiene: Dried secretions Oral Cavity - Dentition: Adequate natural dentition Orofacial Anatomy: WFL Oral Motor/Sensory Function:  WFL Anatomy: Anatomy: Suspected cervical osteophytes Boluses Administered: Boluses Administered Boluses Administered: Thin liquids (Level 0); Mildly thick liquids (Level 2, nectar thick); Moderately thick liquids (Level 3, honey thick); Puree; Solid  Oral Impairment Domain: Oral Impairment Domain Lip Closure: Interlabial escape, no progression to anterior lip Tongue control during bolus hold: Escape to lateral buccal cavity/floor of mouth Bolus preparation/mastication: Timely and efficient chewing and mashing Bolus transport/lingual motion: Delayed initiation of tongue motion (oral holding) Oral residue: Complete oral clearance Location of oral residue : N/A Initiation of pharyngeal swallow : Posterior angle of the ramus  Pharyngeal Impairment Domain: Pharyngeal Impairment Domain Soft palate elevation: No bolus between soft palate (SP)/pharyngeal wall (PW) Laryngeal elevation: Complete superior movement of thyroid cartilage with  complete approximation of arytenoids to epiglottic petiole Anterior hyoid excursion: Complete anterior movement Epiglottic movement: Complete inversion Laryngeal vestibule closure: Complete, no air/contrast in laryngeal vestibule Pharyngeal stripping wave : Present - complete Pharyngeal contraction (A/P view only): N/A Pharyngoesophageal segment opening: Complete distension and complete duration, no obstruction of flow Tongue base retraction: Narrow column of contrast or air between tongue base and PPW Pharyngeal residue: Trace residue within or on pharyngeal structures Location of pharyngeal residue: Tongue base; Pharyngeal wall  Esophageal Impairment Domain: Esophageal Impairment Domain Esophageal clearance upright position: Complete clearance, esophageal coating Pill: Pill Consistency administered: Thin liquids (Level 0) Thin liquids (Level 0): [place] Penetration/Aspiration Scale Score: Penetration/Aspiration Scale Score 1.  Material does not enter airway: Mildly thick liquids (Level 2, nectar thick); Moderately thick liquids (Level 3, honey thick); Puree; Solid; Pill 2.  Material enters airway, remains ABOVE vocal cords then ejected out: Thin liquids (Level 0) Compensatory Strategies: Compensatory Strategies Compensatory strategies: Yes Straw: Effective Effective Straw: Thin liquid (Level 0); Mildly thick liquid (Level 2, nectar thick)   General Information: Caregiver present: No  Diet Prior to this Study: NPO   Temperature : Normal   Respiratory Status: Increased WOB   Supplemental O2: None (Room air)   History of Recent Intubation: Yes  Behavior/Cognition: Alert; Cooperative; Requires cueing Self-Feeding Abilities: Dependent for feeding Baseline vocal quality/speech: Dysphonic; Hypophonia/low volume Volitional Cough: Able to elicit Volitional Swallow: Able to elicit Exam Limitations: Fatigue Goal Planning: Prognosis for improved oropharyngeal function: Good Barriers to Reach Goals: Cognitive deficits; Time post  onset No data recorded Patient/Family Stated Goal: none stated Consulted and agree with results and recommendations: Patient Pain: Pain Assessment Pain Assessment: Faces Faces Pain Scale: 2 Pain Location: open abdominal site Pain Descriptors / Indicators: Grimacing Pain Intervention(s): Monitored during session End of Session: Start Time:SLP Start Time (ACUTE ONLY): 4098 Stop Time: SLP Stop Time (ACUTE ONLY): 0943 Time Calculation:SLP Time Calculation (min) (ACUTE ONLY): 17 min Charges: SLP Evaluations $ SLP Speech Visit: 1 Visit SLP Evaluations $MBS Swallow: 1 Procedure $Swallowing Treatment: 1 Procedure SLP visit diagnosis: SLP Visit Diagnosis: Dysphagia, oropharyngeal phase (R13.12) Past Medical History: Past Medical History: Diagnosis Date  Ankle fracture   Cancer (HCC) 1974  testicular cancer-at age 76  Cataract   bilateral sx  GERD (gastroesophageal reflux disease)   hx of  Hx of colonic polyps   pt unsure,thinks this was done in 1982 in [place]. no reports in EPIC  Hyperlipidemia   on meds  Hypertension   on meds  Kidney stones   Patella fracture   Retinal detachment   OD  Retinal tear of right eye   Syncopal episodes 2008 Past Surgical History: Past Surgical History: Procedure Laterality Date  AIR/FLUID EXCHANGE Right 06/16/2014  Procedure: AIR/FLUID EXCHANGE;  Surgeon: Sherrie George, MD;  Location: [facility] OR;  Service: Ophthalmology;  Laterality: Right;  ANGIOPLASTY Left 12/21/2022  Procedure: BALLOON ANGIOPLASTY SUPERIOR MESENTERIC ARTERY;  Surgeon: Victorino Sparrow, MD;  Location: [facility] OR;  Service: Vascular;  Laterality: Left;  APPLICATION OF WOUND VAC N/A 12/21/2022  Procedure: APPLICATION OF ABTHERA WOUND VAC;  Surgeon: Violeta Gelinas, MD;  Location: [facility] OR;  Service: General;  Laterality: N/A;  BIOPSY  04/04/2021  Procedure: BIOPSY;  Surgeon: Lemar Lofty., MD;  Location: [facility] ENDOSCOPY;  Service: Gastroenterology;;  BOWEL RESECTION  12/21/2022  Procedure: SMALL BOWEL RESECTION;  Surgeon: Violeta Gelinas,  MD;  Location: [facility] OR;  Service: General;;  BOWEL RESECTION N/A 12/25/2022  Procedure: SMALL BOWEL RESECTION W/ANASTOMOSIS X2;  Surgeon: Gaynelle Adu, MD;  Location: [facility] OR;  Service: General;  Laterality: N/A;  CATARACT EXTRACTION    CATARACT EXTRACTION  09/2014  rt eye//left 01/2015  CATARACT EXTRACTION, BILATERAL  2016  per Dr. Elmer Picker   COLONOSCOPY  02/16/2020  per Dr. Rhea Belton, adenomatous polyps, repeat in 3 yrs  ENTEROSCOPY N/A 04/04/2021  Procedure: ENTEROSCOPY;  Surgeon: Mansouraty, Netty Starring., MD;  Location: [facility] ENDOSCOPY;  Service: Gastroenterology;  Laterality: N/A;  EYE SURGERY    GAS INSERTION Right 06/16/2014  Procedure: INSERTION OF GAS;  Surgeon: Sherrie George, MD;  Location: [facility] OR;  Service: Ophthalmology;  Laterality: Right;  [id number]  GIVENS CAPSULE STUDY N/A 04/02/2021  Procedure: GIVENS CAPSULE STUDY;  Surgeon: Lemar Lofty., MD;  Location: [facility] ENDOSCOPY;  Service: Gastroenterology;  Laterality: N/A;  HYDROCELE EXCISION    INSERTION OF ILIAC STENT N/A 12/21/2022  Procedure: INSERTION OF SUPERIOR MESENTERIC ARTERY STENT;  Surgeon: Victorino Sparrow, MD;  Location: [facility] OR;  Service: Vascular;  Laterality: N/A;  IR ANGIOGRAM VISCERAL SELECTIVE  04/04/2021  IR US GUIDE VASC ACCESS LEFT  04/04/2021  KNEE ARTHROSCOPY Right   x 2  LAPAROTOMY N/A 12/21/2022  Procedure: EXPLORATION LAPAROTOMY;  Surgeon: Violeta Gelinas, MD;  Location: [facility] OR;  Service: General;  Laterality: N/A;  LAPAROTOMY N/A 12/23/2022  Procedure: RE-EXPLORATION LAPAROTOMY ABTHERA VAC EXCHANGE;  Surgeon: Almond Lint, MD;  Location: [facility] OR;  Service: General;  Laterality: N/A;  LAPAROTOMY N/A 12/25/2022  Procedure: RE-EXPLORATION LAPAROTOMY W/CLOSURE;  Surgeon: Gaynelle Adu, MD;  Location: [facility] OR;  Service: General;  Laterality: N/A;  LASER PHOTO ABLATION Right 06/16/2014  Procedure: LASER PHOTO ABLATION;  Surgeon: Sherrie George, MD;  Location: [facility] OR;  Service: Ophthalmology;  Laterality: Right;  Headscope laser and endolaser    ORCHIECTOMY    right testicle  REPAIR OF COMPLEX TRACTION RETINAL DETACHMENT Right 06/16/2014  Procedure: REPAIR OF COMPLEX TRACTION RETINAL DETACHMENT;  Surgeon: Sherrie George, MD;  Location: [facility] OR;  Service: Ophthalmology;  Laterality: Right;  RETINAL DETACHMENT SURGERY  06/2014  SUBMUCOSAL TATTOO INJECTION  04/04/2021  Procedure: SUBMUCOSAL TATTOO INJECTION;  Surgeon: Lemar Lofty., MD;  Location: [facility] ENDOSCOPY;  Service: Gastroenterology;;  TONSILLECTOMY    ULTRASOUND GUIDANCE FOR VASCULAR ACCESS Left 12/21/2022  Procedure: ULTRASOUND GUIDANCE FOR VASCULAR ACCESS FEMORAL ARTER;  Surgeon: Victorino Sparrow, MD;  Location: [facility] OR;  Service: Vascular;  Laterality: Left;  VISCERAL ANGIOGRAM Left 12/21/2022  Procedure: SUPERIOR MESENTERIC ARTERY ANGIOGRAM;  Surgeon: Victorino Sparrow, MD;  Location: [facility] OR;  Service: Vascular;  Laterality: Left;  VITRECTOMY Right 06/16/2014  WISDOM TOOTH EXTRACTION   Gwynneth Aliment, M.A., CF-SLP Speech Language Pathology, Acute Rehabilitation Services Secure Chat preferred [phone] 01/08/2023, 10:16 AM     Impression /Plan:  67 yo  male with SMA stenosis/thrombosis and small bowel ischemia.  S/p SMA thrombectomy and stent 9/12 Dr. Karin Lieu, exploratory laparotomy and small bowel resection Dr. Janee Morn.  S/p re-exploration, replace abthera FB 12/23/22   S/p re-exploration, small bowel resection with anastomosis x2, assessment of bowel perfusion with ICG, abdominal closure 9/16 Dr. Andrey Campanile.  Acute on chronic anemia secondary to blood loss.  Today hemoglobin is 6.1.  Dropped from 7.4 yesterday.  Hemoglobin was 13.4 on admission.  Patient has required a total of 5 units of PRBCs.  Complicated by anticoagulation use.  Hemoglobin has drifted downwards and today is 6.1 BUN elevated at 102. RN reports two episodes of BRB with clots today. Suspect BRB from Small bowel ischemia or oozing from anastomosis site. -Continue Protonix 40mg  BID IV -Clear liquid diet Per SLP -On  TPN -Monitor H&H -Blood transfusions as needed per nephrology -consider CTA or tagged RBC scan if active acute bleeding occurs -no plan for endoscopic evaluation at this time  Hematochezia. RN reported two BRB per rectum today. 9/28 positive FOBT. Complicated by recent abd surgeries and argatroban use. Per vascular surgery, continuing anticoagulation is critical for patient's survival.Patient on ASA suppository to lessen risk of stent occlusion, given today.  -supportive care -closely monitor and blood transfusion per nephrology -consider CTA or tagged RBC scan if active acute bleeding occurrs  AKI on CKD stage IIIb , BUN 102, Creat 2.24.  Baseline creatinine 2.0.Intermittent hemodialysis started 9/18 with transition to CRRT on 9/20-9/24. CRRT was dc'd on 9/24 secondary to recurrent clotting. Hemodialysis held secondary to improving urine output.  -per nephrology  GERD -Continue Protonix  Atrial fibrillation.  On amiodarone.  Severe malnutrition  -on TPN per surgery -ongoing SLP evaluation and recommendations  Hypokalemia -Replete potassium per pharmacy while on TPN   Hypomagnesemia -Replete Magnesium per pharmacy while on TPN  Other comorbidities: Primary hypertension-meds held, hyperlipidemia- crestor outpt     Principal Problem:   Superior mesenteric artery stenosis (HCC) Active Problems:   Hyperlipidemia   Essential hypertension   Hypoalbuminemia   GERD (gastroesophageal reflux disease)   Acute renal failure superimposed on stage 3a chronic kidney disease (HCC)   Mesenteric ischemia (HCC)   Endotracheal tube present   Protein-calorie malnutrition, severe   Acute respiratory failure (HCC)   Physical deconditioning    LOS: 19 days    Thank you for your kind consultation, we will continue to follow.   Caoimhe Damron J Jakaylah Schlafer  01/08/2023, 2:55 PM

## 2023-01-08 NOTE — Evaluation (Signed)
Speech Language Pathology Evaluation Patient Details Name: [name] [name] MRN: [id number] DOB: 01-Apr-1956 Today's Date: 01/08/2023 Time: 4166-0630 SLP Time Calculation (min) (ACUTE ONLY): 20 min  Problem List:  Patient Active Problem List   Diagnosis Date Noted   Physical deconditioning 01/04/2023   Acute respiratory failure (HCC) 12/28/2022   Protein-calorie malnutrition, severe 12/25/2022   Hypoalbuminemia 12/21/2022   GERD (gastroesophageal reflux disease) 12/21/2022   Acute renal failure superimposed on stage 3a chronic kidney disease (HCC) 12/21/2022   Mesenteric ischemia (HCC) 12/21/2022   Endotracheal tube present 12/21/2022   Superior mesenteric artery stenosis (HCC) 12/20/2022   CKD (chronic kidney disease) stage 3, GFR 30-59 ml/min (HCC) 09/11/2022   GIST (gastrointestinal stromal tumor) of small bowel, malignant (HCC) 07/26/2021   Acute GI bleeding 04/01/2021   Acute blood loss anemia 04/01/2021   Right hip impingement syndrome 08/18/2019   Aortic atherosclerosis (HCC) 07/10/2019   Elevated hemoglobin (HCC) 09/13/2017   Rhegmatogenous retinal detachment of right eye 06/16/2014   Hyperlipidemia 10/01/2007   Essential hypertension 10/01/2007   NEOPLASM, MALIGNANT, TESTES, HX OF 10/01/2007   COLONIC POLYPS, HX OF 10/01/2007   NEPHROLITHIASIS, HX OF 10/01/2007   Past Medical History:  Past Medical History:  Diagnosis Date   Ankle fracture    Cancer (HCC) 1974   testicular cancer-at age 31   Cataract    bilateral sx   GERD (gastroesophageal reflux disease)    hx of   Hx of colonic polyps    pt unsure,thinks this was done in 1982 in [place]. no reports in EPIC   Hyperlipidemia    on meds   Hypertension    on meds   Kidney stones    Patella fracture    Retinal detachment    OD   Retinal tear of right eye    Syncopal episodes 2008   Past Surgical History:  Past Surgical History:  Procedure Laterality Date   AIR/FLUID EXCHANGE Right 06/16/2014   Procedure:  AIR/FLUID EXCHANGE;  Surgeon: Sherrie George, MD;  Location: [facility] OR;  Service: Ophthalmology;  Laterality: Right;   ANGIOPLASTY Left 12/21/2022   Procedure: BALLOON ANGIOPLASTY SUPERIOR MESENTERIC ARTERY;  Surgeon: Victorino Sparrow, MD;  Location: [facility] OR;  Service: Vascular;  Laterality: Left;   APPLICATION OF WOUND VAC N/A 12/21/2022   Procedure: APPLICATION OF ABTHERA WOUND VAC;  Surgeon: Violeta Gelinas, MD;  Location: [facility] OR;  Service: General;  Laterality: N/A;   BIOPSY  04/04/2021   Procedure: BIOPSY;  Surgeon: Lemar Lofty., MD;  Location: [facility] ENDOSCOPY;  Service: Gastroenterology;;   BOWEL RESECTION  12/21/2022   Procedure: SMALL BOWEL RESECTION;  Surgeon: Violeta Gelinas, MD;  Location: [facility] OR;  Service: General;;   BOWEL RESECTION N/A 12/25/2022   Procedure: SMALL BOWEL RESECTION W/ANASTOMOSIS X2;  Surgeon: Gaynelle Adu, MD;  Location: [facility] OR;  Service: General;  Laterality: N/A;   CATARACT EXTRACTION     CATARACT EXTRACTION  09/2014   rt eye//left 01/2015   CATARACT EXTRACTION, BILATERAL  2016   per Dr. Elmer Picker    COLONOSCOPY  02/16/2020   per Dr. Rhea Belton, adenomatous polyps, repeat in 3 yrs   ENTEROSCOPY N/A 04/04/2021   Procedure: ENTEROSCOPY;  Surgeon: Mansouraty, Netty Starring., MD;  Location: [facility] ENDOSCOPY;  Service: Gastroenterology;  Laterality: N/A;   EYE SURGERY     GAS INSERTION Right 06/16/2014   Procedure: INSERTION OF GAS;  Surgeon: Sherrie George, MD;  Location: [facility] OR;  Service: Ophthalmology;  Laterality: Right;  [id number]  GIVENS CAPSULE STUDY N/A 04/02/2021   Procedure: GIVENS CAPSULE STUDY;  Surgeon: Lemar Lofty., MD;  Location: [facility] ENDOSCOPY;  Service: Gastroenterology;  Laterality: N/A;   HYDROCELE EXCISION     INSERTION OF ILIAC STENT N/A 12/21/2022   Procedure: INSERTION OF SUPERIOR MESENTERIC ARTERY STENT;  Surgeon: Victorino Sparrow, MD;  Location: [facility] OR;  Service: Vascular;  Laterality: N/A;   IR ANGIOGRAM VISCERAL SELECTIVE  04/04/2021   IR US GUIDE  VASC ACCESS LEFT  04/04/2021   KNEE ARTHROSCOPY Right    x 2   LAPAROTOMY N/A 12/21/2022   Procedure: EXPLORATION LAPAROTOMY;  Surgeon: Violeta Gelinas, MD;  Location: [facility] OR;  Service: General;  Laterality: N/A;   LAPAROTOMY N/A 12/23/2022   Procedure: RE-EXPLORATION LAPAROTOMY ABTHERA VAC EXCHANGE;  Surgeon: Almond Lint, MD;  Location: [facility] OR;  Service: General;  Laterality: N/A;   LAPAROTOMY N/A 12/25/2022   Procedure: RE-EXPLORATION LAPAROTOMY W/CLOSURE;  Surgeon: Gaynelle Adu, MD;  Location: [facility] OR;  Service: General;  Laterality: N/A;   LASER PHOTO ABLATION Right 06/16/2014   Procedure: LASER PHOTO ABLATION;  Surgeon: Sherrie George, MD;  Location: [facility] OR;  Service: Ophthalmology;  Laterality: Right;  Headscope laser and endolaser    ORCHIECTOMY     right testicle   REPAIR OF COMPLEX TRACTION RETINAL DETACHMENT Right 06/16/2014   Procedure: REPAIR OF COMPLEX TRACTION RETINAL DETACHMENT;  Surgeon: Sherrie George, MD;  Location: [facility] OR;  Service: Ophthalmology;  Laterality: Right;   RETINAL DETACHMENT SURGERY  06/2014   SUBMUCOSAL TATTOO INJECTION  04/04/2021   Procedure: SUBMUCOSAL TATTOO INJECTION;  Surgeon: Lemar Lofty., MD;  Location: [facility] ENDOSCOPY;  Service: Gastroenterology;;   TONSILLECTOMY     ULTRASOUND GUIDANCE FOR VASCULAR ACCESS Left 12/21/2022   Procedure: ULTRASOUND GUIDANCE FOR VASCULAR ACCESS FEMORAL ARTER;  Surgeon: Victorino Sparrow, MD;  Location: [facility] OR;  Service: Vascular;  Laterality: Left;   VISCERAL ANGIOGRAM Left 12/21/2022   Procedure: SUPERIOR MESENTERIC ARTERY ANGIOGRAM;  Surgeon: Victorino Sparrow, MD;  Location: [facility] OR;  Service: Vascular;  Laterality: Left;   VITRECTOMY Right 06/16/2014   WISDOM TOOTH EXTRACTION     HPI:  Pt is a 67 yo male presenting to ED from OP vascular surgery office with known mesenteric artery stenosis s/p SMA angiogram. Admitted 9/11 with several week history of abdominal pain with 30 lb weight loss. W/u included mesenteric  ischemia and necrotic area of jejunum s/p exploratory laparotomy and small bowel resection 9/12, reopening of laparotomy 9/14, second reopening and small bowel resection with anastomosis x2 9/26. Intubated 9/12-9/22. PMH includes testicular cancer, HTN, retinal detachment, GERD, HTN, HLD   Assessment / Plan / Recommendation Clinical Impression  Pt with improved quality of voice and pt's wife reporting seemingly has returned to baseline, although note deficits related to articulation resulting in decreased intelligibility. He reports PTA he lived with his wife at home, handling all medications independently. He states that he has noticed acute changes related to short-term memory. Pt currently unable to hold a pen to participate in the clock drawing task on the SLUMS. Of the remaining 26 points, pt scored a 19 with deficits related to sustained attention, divergent naming, problem solving, and memory (storage>retrieval). While SLP present in room, MD rounding who reports pt has elevated BUN which may be contributing to AMS. Pt will likely benefit from ongoing intensive SLP intervention, >3 hours a day targeting deficits listed above to facilitate return to PLOF.    SLP Assessment  SLP Recommendation/Assessment:  Patient needs continued Speech Lanaguage Pathology Services SLP Visit Diagnosis: Aphonia (R49.1);Cognitive communication deficit (R41.841)    Recommendations for follow up therapy are one component of a multi-disciplinary discharge planning process, led by the attending physician.  Recommendations may be updated based on patient status, additional functional criteria and insurance authorization.    Follow Up Recommendations  Acute inpatient rehab (3hours/day)    Assistance Recommended at Discharge  Frequent or constant Supervision/Assistance  Functional Status Assessment Patient has had a recent decline in their functional status and demonstrates the ability to make significant improvements in  function in a reasonable and predictable amount of time.  Frequency and Duration min 2x/week  2 weeks      SLP Evaluation Cognition  Overall Cognitive Status: Impaired/Different from baseline Arousal/Alertness: Awake/alert Orientation Level: Oriented X4 Attention: Sustained Sustained Attention: Impaired Sustained Attention Impairment: Verbal basic;Functional basic Memory: Impaired Memory Impairment: Storage deficit;Retrieval deficit Awareness: Appears intact Problem Solving: Impaired Problem Solving Impairment: Verbal basic       Comprehension  Auditory Comprehension Overall Auditory Comprehension: Appears within functional limits for tasks assessed    Expression Expression Primary Mode of Expression: Verbal Verbal Expression Overall Verbal Expression: Impaired Naming: Impairment Divergent: 25-49% accurate Written Expression Dominant Hand: Right   Oral / Motor  Oral Motor/Sensory Function Overall Oral Motor/Sensory Function: Within functional limits Motor Speech Overall Motor Speech: Impaired Respiration: Impaired Level of Impairment: Conversation Phonation: Hoarse Resonance: Within functional limits Articulation: Impaired Level of Impairment: Conversation Intelligibility: Intelligibility reduced Conversation: 75-100% accurate            Gwynneth Aliment, M.A., CF-SLP Speech Language Pathology, Acute Rehabilitation Services  Secure Chat preferred [phone]  01/08/2023, 12:38 PM

## 2023-01-08 NOTE — Progress Notes (Addendum)
Continue TPN per general surgery -Ongoing SLP eval/recommendations  Primary hypertension Uncontrolled. Antihypertensives held.  GERD -Continue Protonix  Hyperlipidemia Patient is on Crestor as an outpatient which is held.  Transient atrial fibrillation Patient managed on amiodarone with conversion back to sinus rhythm. Amiodarone continued with plan to transition to oral.  Pressure injury Right buttocks. Unclear if present on admission.    DVT prophylaxis: Argatroban Code Status:   Code Status: Full Code Family Communication: Wife at bedside Disposition Plan: Discharge likely to inpatient rehabilitation once able to wean TPN, advance PO diet and pending ongoing general surgery and nephrology recommendations   Consultants:  PCCM General surgery Nephrology Interventional radiology  Procedures:    Antimicrobials: Zosyn Vancomycin Meropenem    Subjective: Patient feels well this morning. No significant bleeding since stopping argatroban.  Objective: BP (!) 180/62 (BP Location: Right Arm)   Pulse 82   Temp 97.9 F (36.6 C) (Oral)   Resp 20   Ht 5\' 11"  (1.803 m)   Wt 103.7 kg   SpO2 100%   BMI 31.89 kg/m   Examination:  General exam: Appears calm and comfortable Respiratory system: Clear to auscultation. Respiratory effort normal. Cardiovascular system: S1 & S2 heard, RRR. Gastrointestinal system: Abdomen is nondistended, soft and nontender. Normal bowel sounds heard. Central nervous system: Alert and oriented. Psychiatry: Judgement and insight appear normal. Mood & affect appropriate.    Data Reviewed: I have personally reviewed following labs and imaging studies  CBC Lab Results  Component Value Date   WBC 9.8 01/08/2023   RBC 2.24 (L) 01/08/2023   HGB 6.0 (LL) 01/08/2023   HCT 19.0 (L) 01/08/2023   MCV 84.8 01/08/2023   MCH 26.8  01/08/2023   PLT 196 01/08/2023   MCHC 31.6 01/08/2023   RDW 17.5 (H) 01/08/2023   LYMPHSABS 0.9 10/30/2022   MONOABS 0.6 10/30/2022   EOSABS 0.3 10/30/2022   BASOSABS 0.0 10/30/2022     Last metabolic panel Lab Results  Component Value Date   NA 136 01/08/2023   K 3.8 01/08/2023   CL 113 (H) 01/08/2023   CO2 16 (L) 01/08/2023   BUN 102 (H) 01/08/2023   CREATININE 2.24 (H) 01/08/2023   GLUCOSE 214 (H) 01/08/2023   GFRNONAA 31 (L) 01/08/2023   GFRAA >60 04/14/2015   CALCIUM 7.4 (L) 01/08/2023   PHOS 3.7 01/08/2023   PROT 4.3 (L) 01/08/2023   ALBUMIN 1.6 (L) 01/08/2023   BILITOT 0.6 01/08/2023   ALKPHOS 96 01/08/2023   AST 79 (H) 01/08/2023   ALT 198 (H) 01/08/2023   ANIONGAP 7 01/08/2023    GFR: Estimated Creatinine Clearance: 38.7 mL/min (A) (by C-G formula based on SCr of 2.24 mg/dL (H)).  No results found for this or any previous visit (from the past 240 hour(s)).    Radiology Studies: DG Swallowing Func-Speech Pathology  Result Date: 01/08/2023 Table formatting from the original result was not included. Modified Barium Swallow Study Patient Details Name: [name] [name] MRN: [id number] Date of Birth: [birth year] Today's Date: 01/08/2023 HPI/PMH: HPI: Pt is a 67 yo male presenting to ED from OP vascular surgery office with known mesenteric artery stenosis s/p SMA angiogram. Admitted 9/11 with several week history of abdominal pain with 30 lb weight loss. W/u included mesenteric ischemia and necrotic area of jejunum s/p exploratory laparotomy and small bowel resection 9/12, reopening of laparotomy 9/14, second reopening and small bowel resection with anastomosis x2 9/26. Intubated 9/12-9/22. PMH includes testicular cancer, HTN, retinal detachment, GERD, HTN, HLD  Continue TPN per general surgery -Ongoing SLP eval/recommendations  Primary hypertension Uncontrolled. Antihypertensives held.  GERD -Continue Protonix  Hyperlipidemia Patient is on Crestor as an outpatient which is held.  Transient atrial fibrillation Patient managed on amiodarone with conversion back to sinus rhythm. Amiodarone continued with plan to transition to oral.  Pressure injury Right buttocks. Unclear if present on admission.    DVT prophylaxis: Argatroban Code Status:   Code Status: Full Code Family Communication: Wife at bedside Disposition Plan: Discharge likely to inpatient rehabilitation once able to wean TPN, advance PO diet and pending ongoing general surgery and nephrology recommendations   Consultants:  PCCM General surgery Nephrology Interventional radiology  Procedures:    Antimicrobials: Zosyn Vancomycin Meropenem    Subjective: Patient feels well this morning. No significant bleeding since stopping argatroban.  Objective: BP (!) 180/62 (BP Location: Right Arm)   Pulse 82   Temp 97.9 F (36.6 C) (Oral)   Resp 20   Ht 5\' 11"  (1.803 m)   Wt 103.7 kg   SpO2 100%   BMI 31.89 kg/m   Examination:  General exam: Appears calm and comfortable Respiratory system: Clear to auscultation. Respiratory effort normal. Cardiovascular system: S1 & S2 heard, RRR. Gastrointestinal system: Abdomen is nondistended, soft and nontender. Normal bowel sounds heard. Central nervous system: Alert and oriented. Psychiatry: Judgement and insight appear normal. Mood & affect appropriate.    Data Reviewed: I have personally reviewed following labs and imaging studies  CBC Lab Results  Component Value Date   WBC 9.8 01/08/2023   RBC 2.24 (L) 01/08/2023   HGB 6.0 (LL) 01/08/2023   HCT 19.0 (L) 01/08/2023   MCV 84.8 01/08/2023   MCH 26.8  01/08/2023   PLT 196 01/08/2023   MCHC 31.6 01/08/2023   RDW 17.5 (H) 01/08/2023   LYMPHSABS 0.9 10/30/2022   MONOABS 0.6 10/30/2022   EOSABS 0.3 10/30/2022   BASOSABS 0.0 10/30/2022     Last metabolic panel Lab Results  Component Value Date   NA 136 01/08/2023   K 3.8 01/08/2023   CL 113 (H) 01/08/2023   CO2 16 (L) 01/08/2023   BUN 102 (H) 01/08/2023   CREATININE 2.24 (H) 01/08/2023   GLUCOSE 214 (H) 01/08/2023   GFRNONAA 31 (L) 01/08/2023   GFRAA >60 04/14/2015   CALCIUM 7.4 (L) 01/08/2023   PHOS 3.7 01/08/2023   PROT 4.3 (L) 01/08/2023   ALBUMIN 1.6 (L) 01/08/2023   BILITOT 0.6 01/08/2023   ALKPHOS 96 01/08/2023   AST 79 (H) 01/08/2023   ALT 198 (H) 01/08/2023   ANIONGAP 7 01/08/2023    GFR: Estimated Creatinine Clearance: 38.7 mL/min (A) (by C-G formula based on SCr of 2.24 mg/dL (H)).  No results found for this or any previous visit (from the past 240 hour(s)).    Radiology Studies: DG Swallowing Func-Speech Pathology  Result Date: 01/08/2023 Table formatting from the original result was not included. Modified Barium Swallow Study Patient Details Name: [name] [name] MRN: [id number] Date of Birth: [birth year] Today's Date: 01/08/2023 HPI/PMH: HPI: Pt is a 67 yo male presenting to ED from OP vascular surgery office with known mesenteric artery stenosis s/p SMA angiogram. Admitted 9/11 with several week history of abdominal pain with 30 lb weight loss. W/u included mesenteric ischemia and necrotic area of jejunum s/p exploratory laparotomy and small bowel resection 9/12, reopening of laparotomy 9/14, second reopening and small bowel resection with anastomosis x2 9/26. Intubated 9/12-9/22. PMH includes testicular cancer, HTN, retinal detachment, GERD, HTN, HLD  Continue TPN per general surgery -Ongoing SLP eval/recommendations  Primary hypertension Uncontrolled. Antihypertensives held.  GERD -Continue Protonix  Hyperlipidemia Patient is on Crestor as an outpatient which is held.  Transient atrial fibrillation Patient managed on amiodarone with conversion back to sinus rhythm. Amiodarone continued with plan to transition to oral.  Pressure injury Right buttocks. Unclear if present on admission.    DVT prophylaxis: Argatroban Code Status:   Code Status: Full Code Family Communication: Wife at bedside Disposition Plan: Discharge likely to inpatient rehabilitation once able to wean TPN, advance PO diet and pending ongoing general surgery and nephrology recommendations   Consultants:  PCCM General surgery Nephrology Interventional radiology  Procedures:    Antimicrobials: Zosyn Vancomycin Meropenem    Subjective: Patient feels well this morning. No significant bleeding since stopping argatroban.  Objective: BP (!) 180/62 (BP Location: Right Arm)   Pulse 82   Temp 97.9 F (36.6 C) (Oral)   Resp 20   Ht 5\' 11"  (1.803 m)   Wt 103.7 kg   SpO2 100%   BMI 31.89 kg/m   Examination:  General exam: Appears calm and comfortable Respiratory system: Clear to auscultation. Respiratory effort normal. Cardiovascular system: S1 & S2 heard, RRR. Gastrointestinal system: Abdomen is nondistended, soft and nontender. Normal bowel sounds heard. Central nervous system: Alert and oriented. Psychiatry: Judgement and insight appear normal. Mood & affect appropriate.    Data Reviewed: I have personally reviewed following labs and imaging studies  CBC Lab Results  Component Value Date   WBC 9.8 01/08/2023   RBC 2.24 (L) 01/08/2023   HGB 6.0 (LL) 01/08/2023   HCT 19.0 (L) 01/08/2023   MCV 84.8 01/08/2023   MCH 26.8  01/08/2023   PLT 196 01/08/2023   MCHC 31.6 01/08/2023   RDW 17.5 (H) 01/08/2023   LYMPHSABS 0.9 10/30/2022   MONOABS 0.6 10/30/2022   EOSABS 0.3 10/30/2022   BASOSABS 0.0 10/30/2022     Last metabolic panel Lab Results  Component Value Date   NA 136 01/08/2023   K 3.8 01/08/2023   CL 113 (H) 01/08/2023   CO2 16 (L) 01/08/2023   BUN 102 (H) 01/08/2023   CREATININE 2.24 (H) 01/08/2023   GLUCOSE 214 (H) 01/08/2023   GFRNONAA 31 (L) 01/08/2023   GFRAA >60 04/14/2015   CALCIUM 7.4 (L) 01/08/2023   PHOS 3.7 01/08/2023   PROT 4.3 (L) 01/08/2023   ALBUMIN 1.6 (L) 01/08/2023   BILITOT 0.6 01/08/2023   ALKPHOS 96 01/08/2023   AST 79 (H) 01/08/2023   ALT 198 (H) 01/08/2023   ANIONGAP 7 01/08/2023    GFR: Estimated Creatinine Clearance: 38.7 mL/min (A) (by C-G formula based on SCr of 2.24 mg/dL (H)).  No results found for this or any previous visit (from the past 240 hour(s)).    Radiology Studies: DG Swallowing Func-Speech Pathology  Result Date: 01/08/2023 Table formatting from the original result was not included. Modified Barium Swallow Study Patient Details Name: [name] [name] MRN: [id number] Date of Birth: [birth year] Today's Date: 01/08/2023 HPI/PMH: HPI: Pt is a 67 yo male presenting to ED from OP vascular surgery office with known mesenteric artery stenosis s/p SMA angiogram. Admitted 9/11 with several week history of abdominal pain with 30 lb weight loss. W/u included mesenteric ischemia and necrotic area of jejunum s/p exploratory laparotomy and small bowel resection 9/12, reopening of laparotomy 9/14, second reopening and small bowel resection with anastomosis x2 9/26. Intubated 9/12-9/22. PMH includes testicular cancer, HTN, retinal detachment, GERD, HTN, HLD  PROGRESS NOTE    [name] [name]  AVW:[id number] DOB: October 31, 1955 DOA: 12/20/2022 PCP: [name], MD   Brief Narrative: [name] [name] is a 67 y.o. male with a history of GERD, hyperlipidemia, hypertension.  Patient presented secondary to persistent abdominal pain with concern for superior mesenteric artery thrombus and resultant mesenteric ischemia.  CTA abdomen confirmed acute occlusion of the distal SMA in addition to occlusion of the inferior mesenteric artery.  Patient was taken emergently to the OR by general surgery and vascular surgery on 9/12 for exploratory laparotomy with small bowel resection in addition to percutaneous section of thrombectomy to the SMA, balloon angioplasty of the SMA and stenting of SMA.  Patient required continued intubation and ventilator support postop and was admitted to the ICU for management.  Patient required return to the operating room on 9/14 for reexploratory laparotomy and evaluation of SMA with evidence of ischemic but not frankly necrotic bowel.  Patient returned to the operating room for the third time on 9/16 for reexploratory laparotomy with small bowel resection with anastomosis x 2.  During hospitalization, patient was also managed for atrial fibrillation with amiodarone and return to sinus rhythm.  He also developed AKI and nephrology was consulted.  Patient required intermittent hemodialysis in addition to a course of CRRT while in the ICU.  Patient transferred out of the ICU on 9/27.   Assessment and Plan:  Acute mesenteric ischemia Confirmed on CTA abdomen and pelvis with evidence of occlusion of the distal superior mesenteric artery and inferior mesenteric artery.  Patient required return to operating room x 3 for exploratory laparotomy, angioplasty/stent of SMA, bowel resection with anastomosis.  General surgery removed NG tube on 9/27 with plan to advance diet as able pending speech therapy recommendations. -Ongoing General Surgery  recommendations: advance diet as tolerated/per SLP  Acute respiratory failure with hypoxia Post-operative. Patient remained intubated post surgery on 9/12 and managed in ICU via mechanical ventilation. Patient weaned off ventilator support and extubated on 9/22. Patient weaned to room air. Resolved.  Acute metabolic encephalopathy During ICU admission. Resolved.  AKI on CKD stage IIIb Baseline creatinine around 2.0. Nephrology consulted. Concern for ischemic ATN secondary to surgery, IV contrast, and hypotension. Intermittent hemodialysis started on 9/18 with transition to CRRT on 9/20 while in ICU. CRRT was discontinued on 9/24 secondary to recurrent clotting. Hemodialysis held secondary to improving urine output. -Nephrology recommendations: manage fluid with Lasix  Hypernatremia Associated poor oral intake and renal failure. Sodium improved with initiation of D5 water IV fluids. Hypernatremia resolved.  Black stools Hematochezia Concern for melena with evidence of hematochezia and confirmed by positive FOBT.Marland Kitchen Complicated by recent abdominal surgeries and argatroban use. Patient reports a history of GI bleed in the past. Per vascular surgery, continuing anticoagulation is critical for patient's survival. Frequency of blood per rectum appears to have slowed.  Hypokalemia Potassium supplementation per pharmacy while on TPN  Hypomagnesemia Magnesium supplementation per pharmacy while on TPN  Hypocalcemia Normal when corrected for hypoalbuminemia.  Postoperative anemia Acute blood loss anemia Hemoglobin of 13.4 on admission. Patient has required a total of 5 units of PRBC via transfusion this admission to date related to multiple surgeries. Complicated by anticoagulation use. Patient developed melena/hematochezia on 9/28. Hemoglobin has drifted downwards. Hemoglobin down to 6 g/dL today. BUN elevated. -Continue Protonix -H&H q8 hours -CBC in AM -Transfuse 2 units PRBC  Severe  malnutrition Patient started on TPN. SLP evaluated on 9/27 with recommendation for NPO with ice chips only. -  PROGRESS NOTE    [name] [name]  AVW:[id number] DOB: October 31, 1955 DOA: 12/20/2022 PCP: [name], MD   Brief Narrative: [name] [name] is a 67 y.o. male with a history of GERD, hyperlipidemia, hypertension.  Patient presented secondary to persistent abdominal pain with concern for superior mesenteric artery thrombus and resultant mesenteric ischemia.  CTA abdomen confirmed acute occlusion of the distal SMA in addition to occlusion of the inferior mesenteric artery.  Patient was taken emergently to the OR by general surgery and vascular surgery on 9/12 for exploratory laparotomy with small bowel resection in addition to percutaneous section of thrombectomy to the SMA, balloon angioplasty of the SMA and stenting of SMA.  Patient required continued intubation and ventilator support postop and was admitted to the ICU for management.  Patient required return to the operating room on 9/14 for reexploratory laparotomy and evaluation of SMA with evidence of ischemic but not frankly necrotic bowel.  Patient returned to the operating room for the third time on 9/16 for reexploratory laparotomy with small bowel resection with anastomosis x 2.  During hospitalization, patient was also managed for atrial fibrillation with amiodarone and return to sinus rhythm.  He also developed AKI and nephrology was consulted.  Patient required intermittent hemodialysis in addition to a course of CRRT while in the ICU.  Patient transferred out of the ICU on 9/27.   Assessment and Plan:  Acute mesenteric ischemia Confirmed on CTA abdomen and pelvis with evidence of occlusion of the distal superior mesenteric artery and inferior mesenteric artery.  Patient required return to operating room x 3 for exploratory laparotomy, angioplasty/stent of SMA, bowel resection with anastomosis.  General surgery removed NG tube on 9/27 with plan to advance diet as able pending speech therapy recommendations. -Ongoing General Surgery  recommendations: advance diet as tolerated/per SLP  Acute respiratory failure with hypoxia Post-operative. Patient remained intubated post surgery on 9/12 and managed in ICU via mechanical ventilation. Patient weaned off ventilator support and extubated on 9/22. Patient weaned to room air. Resolved.  Acute metabolic encephalopathy During ICU admission. Resolved.  AKI on CKD stage IIIb Baseline creatinine around 2.0. Nephrology consulted. Concern for ischemic ATN secondary to surgery, IV contrast, and hypotension. Intermittent hemodialysis started on 9/18 with transition to CRRT on 9/20 while in ICU. CRRT was discontinued on 9/24 secondary to recurrent clotting. Hemodialysis held secondary to improving urine output. -Nephrology recommendations: manage fluid with Lasix  Hypernatremia Associated poor oral intake and renal failure. Sodium improved with initiation of D5 water IV fluids. Hypernatremia resolved.  Black stools Hematochezia Concern for melena with evidence of hematochezia and confirmed by positive FOBT.Marland Kitchen Complicated by recent abdominal surgeries and argatroban use. Patient reports a history of GI bleed in the past. Per vascular surgery, continuing anticoagulation is critical for patient's survival. Frequency of blood per rectum appears to have slowed.  Hypokalemia Potassium supplementation per pharmacy while on TPN  Hypomagnesemia Magnesium supplementation per pharmacy while on TPN  Hypocalcemia Normal when corrected for hypoalbuminemia.  Postoperative anemia Acute blood loss anemia Hemoglobin of 13.4 on admission. Patient has required a total of 5 units of PRBC via transfusion this admission to date related to multiple surgeries. Complicated by anticoagulation use. Patient developed melena/hematochezia on 9/28. Hemoglobin has drifted downwards. Hemoglobin down to 6 g/dL today. BUN elevated. -Continue Protonix -H&H q8 hours -CBC in AM -Transfuse 2 units PRBC  Severe  malnutrition Patient started on TPN. SLP evaluated on 9/27 with recommendation for NPO with ice chips only. -

## 2023-01-08 NOTE — Significant Event (Addendum)
Rapid Response Event Note   Reason for Call : Large bloody BM with clots and hypotension.  Initial Focused Assessment:  I was notified by the nursing staff regarding Mr. Weese and his now 2nd large bloody BM with clots since 7pm. Pt with complicated GI history, multiple surgeries including SMA stent and small bowel resection.  Holding anticoagulation. On Amiodarone infusion. 2nd unit of previously ordered 2 units PRBCs infusing. Hgb 6.1/19.1 earlier today prior to the 2 units PRBCs. Pt is arousable to voice. Pt is pale, cool and dry. GI recommends support with PRBCs and IR if bleeding reoccurs. If pt continues to have symptomatic large bloody BMs with clots, will need escalation of care and intervention.   2215-98.69F, HR 82 SR, 81/47 (57), RR 20 with sats 100% on RA 2227-98.69F, HR 86 SR, 90/48 (61), RR 30 with sats 100% on RA   Interventions:  -No interventions per RRRN    MD Notified: Dr. Loney Loh per primary RN.  Call Time: 2218 Arrival Time: 2225 End Time: 2250  Rose Fillers, RN

## 2023-01-08 NOTE — Progress Notes (Signed)
Paged placed to V Rathore [phone].  To inform of Hgb 6.0

## 2023-01-08 NOTE — Progress Notes (Signed)
Modified Barium Swallow Study  Patient Details  Name: [name] [name] MRN: [id number] Date of Birth: Dec 21, 1955  Today's Date: 01/08/2023  Modified Barium Swallow completed.  Full report located under Chart Review in the Imaging Section.  History of Present Illness Pt is a 67 yo male presenting to ED from OP vascular surgery office with known mesenteric artery stenosis s/p SMA angiogram. Admitted 9/11 with several week history of abdominal pain with 30 lb weight loss. W/u included mesenteric ischemia and necrotic area of jejunum s/p exploratory laparotomy and small bowel resection 9/12, reopening of laparotomy 9/14, second reopening and small bowel resection with anastomosis x2 9/26. Intubated 9/12-9/22. PMH includes testicular cancer, HTN, retinal detachment, GERD, HTN, HLD   Clinical Impression Pt presents with a suspected cognitively based oral dysphagia, although his swallow is overall functional. Suspect the taste of the barium contributed to oral holding and gagging, although oral holding of purees was also noted at bedside. Only one instance of trace, transient penetration occurred as pt was attempting to swallow the pill with thin liquids and seemingly gagged. He exhibited great airway protection throughout this event and no further penetration/aspiration was observed throughout trials of nectar or honey thick liquids, purees, and solids. Pt required frequent breaks and had increased WOB at times. Given pt's fatigue and cognition, recommend initiating a diet of full liquids with good potential to upgrade as mentation improves. He will require total assistance for feeding. Question pt's ability to attend throughout the course of a meal and achieve adequate oral intake at this time, therefore, he may require continued TPN until adequate intake is achieved. Will continue to follow to target diet toleration and upgrade as able. Factors that may increase risk of adverse event in presence of aspiration  [employer] 2021): Respiratory or GI disease;Reduced cognitive function;Frail or deconditioned;Dependence for feeding and/or oral hygiene  Swallow Evaluation Recommendations Recommendations: PO diet PO Diet Recommendation: Full liquid diet Liquid Administration via: Spoon;Cup;Straw Medication Administration: Whole meds with puree Supervision: Full assist for feeding;Full supervision/cueing for swallowing strategies Swallowing strategies  : Minimize environmental distractions;Slow rate;Small bites/sips Postural changes: Position pt fully upright for meals Oral care recommendations: Oral care BID (2x/day);Staff/trained caregiver to provide oral care      Gwynneth Aliment, M.A., CF-SLP Speech Language Pathology, Acute Rehabilitation Services  Secure Chat preferred [phone]  01/08/2023,10:07 AM

## 2023-01-08 NOTE — Progress Notes (Signed)
Inpatient Rehab Admissions Coordinator:    CIR following. Pt. Not yet medically stable for rehab at this time, as he remains on TPN. Per surgery, Pt. Cleared to begin clear liquids, so I'm hopefully he may be ready for admit this week.   Megan Salon, MS, CCC-SLP Rehab Admissions Coordinator  [phone] (celll) [phone] (office)

## 2023-01-08 NOTE — Progress Notes (Signed)
Speech Language Pathology Treatment: Dysphagia  Patient Details Name: [name] [name] MRN: [id number] DOB: Jul 19, 1955 Today's Date: 01/08/2023 Time: 1214-1227 SLP Time Calculation (min) (ACUTE ONLY): 13 min  Assessment / Plan / Recommendation Clinical Impression  Pt observed with one sip via straw of thin liquids with no overt s/s of dysphagia or aspiration. Pt fatigued with seemingly increased WOB at time of session, although he expressed interest in POs. Provided education regarding results of MBS, potential for upgrading diet as cognition improves, and ordering desired meals from menu. Pt and his wife verbalize understanding. Recommend continuing full liquid diet with f/u to assess pt's readiness to upgrade as fatigue decreases throughout the course of a meal.     HPI HPI: Pt is a 67 yo male presenting to ED from OP vascular surgery office with known mesenteric artery stenosis s/p SMA angiogram. Admitted 9/11 with several week history of abdominal pain with 30 lb weight loss. W/u included mesenteric ischemia and necrotic area of jejunum s/p exploratory laparotomy and small bowel resection 9/12, reopening of laparotomy 9/14, second reopening and small bowel resection with anastomosis x2 9/26. Intubated 9/12-9/22. PMH includes testicular cancer, HTN, retinal detachment, GERD, HTN, HLD      SLP Plan  Continue with current plan of care  Patient needs continued Speech Lanaguage Pathology Services   Recommendations for follow up therapy are one component of a multi-disciplinary discharge planning process, led by the attending physician.  Recommendations may be updated based on patient status, additional functional criteria and insurance authorization.    Recommendations  Diet recommendations: Thin liquid Liquids provided via: Teaspoon;Cup;Straw Medication Administration: Whole meds with puree Supervision: Full supervision/cueing for compensatory strategies;Staff to assist with self  feeding Compensations: Minimize environmental distractions;Slow rate;Small sips/bites Postural Changes and/or Swallow Maneuvers: Seated upright 90 degrees                  Oral care BID;Staff/trained caregiver to provide oral care   Frequent or constant Supervision/Assistance Dysphagia, oropharyngeal phase (R13.12)     Continue with current plan of care     Gwynneth Aliment, M.A., CF-SLP Speech Language Pathology, Acute Rehabilitation Services  Secure Chat preferred [phone]   01/08/2023, 12:41 PM

## 2023-01-08 NOTE — Progress Notes (Signed)
Progress Note  14 Days Post-Op  Subjective: Pt sleeping this AM but easily awakens. Wife at bedside. No further bloody BM since yesterday. Denies nausea or vomiting. Denies worsening abdominal pain. Cleared for liquids by SLP  Objective: Vital signs in last 24 hours: Temp:  [97.9 F (36.6 C)-98.3 F (36.8 C)] 97.9 F (36.6 C) (09/30 0739) Pulse Rate:  [80-97] 82 (09/30 1128) Resp:  [20] 20 (09/30 1128) BP: (155-182)/(59-73) 180/62 (09/30 1128) SpO2:  [96 %-100 %] 100 % (09/30 1128) Weight:  [103.7 kg] 103.7 kg (09/30 0500) Last BM Date : 01/08/23  Intake/Output from previous day: 09/29 0701 - 09/30 0700 In: 3025 [I.V.:2925; IV Piggyback:100] Out: 3750 [Urine:3750] Intake/Output this shift: No intake/output data recorded.  PE: Gen: alert Card:  RRR Pulm: respirations unlabored  Abd: mildly distended but soft, no TTP, midline incision open at skin, wound is clean and dry.   Lab Results:  Recent Labs    01/07/23 1818 01/07/23 2207 01/08/23 0500  WBC 13.9*  --  9.8  HGB 7.3* 7.4* 6.0*  HCT 22.0* 22.6* 19.0*  PLT 226  --  196   BMET Recent Labs    01/07/23 0545 01/08/23 0500  NA 143 136  K 4.3 3.8  CL 113* 113*  CO2 17* 16*  GLUCOSE 174* 214*  BUN 97* 102*  CREATININE 2.34* 2.24*  CALCIUM 7.7* 7.4*   PT/INR No results for input(s): "LABPROT", "INR" in the last 72 hours. CMP     Component Value Date/Time   NA 136 01/08/2023 0500   K 3.8 01/08/2023 0500   CL 113 (H) 01/08/2023 0500   CO2 16 (L) 01/08/2023 0500   GLUCOSE 214 (H) 01/08/2023 0500   BUN 102 (H) 01/08/2023 0500   CREATININE 2.24 (H) 01/08/2023 0500   CALCIUM 7.4 (L) 01/08/2023 0500   PROT 4.3 (L) 01/08/2023 0500   PROT 6.3 03/16/2020 0800   ALBUMIN 1.6 (L) 01/08/2023 0500   ALBUMIN 4.3 03/16/2020 0800   AST 79 (H) 01/08/2023 0500   ALT 198 (H) 01/08/2023 0500   ALKPHOS 96 01/08/2023 0500   BILITOT 0.6 01/08/2023 0500   BILITOT 0.8 03/16/2020 0800   GFRNONAA 31 (L) 01/08/2023  0500   GFRAA >60 04/14/2015 1127   Lipase     Component Value Date/Time   LIPASE 28 11/27/2022 0755       Studies/Results: DG Swallowing Func-Speech Pathology  Result Date: 01/08/2023 Table formatting from the original result was not included. Modified Barium Swallow Study Patient Details Name: KYMARI ANDREN MRN: [id number] Date of Birth: 18-Jul-1955 Today's Date: 01/08/2023 HPI/PMH: HPI: Pt is a 67 yo male presenting to ED from OP vascular surgery office with known mesenteric artery stenosis s/p SMA angiogram. Admitted 9/11 with several week history of abdominal pain with 30 lb weight loss. W/u included mesenteric ischemia and necrotic area of jejunum s/p exploratory laparotomy and small bowel resection 9/12, reopening of laparotomy 9/14, second reopening and small bowel resection with anastomosis x2 9/26. Intubated 9/12-9/22. PMH includes testicular cancer, HTN, retinal detachment, GERD, HTN, HLD Clinical Impression: Clinical Impression: Pt presents with a suspected cognitively based oral dysphagia, although his swallow is overall functional. Suspect the taste of the barium contributed to oral holding and gagging, although oral holding of purees was also noted at bedside. Only one instance of trace, transient penetration occurred as pt was attempting to swallow the pill with thin liquids and seemingly gagged. He exhibited great airway protection throughout this event and no further  penetration/aspiration was observed throughout trials of nectar or honey thick liquids, purees, and solids. Pt required frequent breaks and had increased WOB at times. Given pt's fatigue and cognition, recommend initiating a diet of full liquids with good potential to upgrade as mentation improves. He will require total assistance for feeding. Question pt's ability to attend throughout the course of a meal and achieve adequate oral intake at this time, therefore, he may require continued TPN until adequate intake is achieved.  Will continue to follow to target diet toleration and upgrade as able. Factors that may increase risk of adverse event in presence of aspiration Rubye Oaks [employer] Clearance Coots 2021): Factors that may increase risk of adverse event in presence of aspiration Rubye Oaks [employer] Clearance Coots 2021): Respiratory or GI disease; Reduced cognitive function; Frail or deconditioned; Dependence for feeding and/or oral hygiene Recommendations/Plan: Swallowing Evaluation Recommendations Swallowing Evaluation Recommendations Recommendations: PO diet PO Diet Recommendation: Full liquid diet Liquid Administration via: Spoon; Cup; Straw Medication Administration: Whole meds with puree Supervision: Full assist for feeding; Full supervision/cueing for swallowing strategies Swallowing strategies  : Minimize environmental distractions; Slow rate; Small bites/sips Postural changes: Position pt fully upright for meals Oral care recommendations: Oral care BID (2x/day); Staff/trained caregiver to provide oral care Treatment Plan Treatment Plan Treatment recommendations: Therapy as outlined in treatment plan below Follow-up recommendations: Acute inpatient rehab (3 hours/day) Functional status assessment: Patient has had a recent decline in their functional status and demonstrates the ability to make significant improvements in function in a reasonable and predictable amount of time. Treatment frequency: Min 2x/week Treatment duration: 2 weeks Interventions: Aspiration precaution training; Oropharyngeal exercises; Patient/family education; Trials of upgraded texture/liquids; Diet toleration management by SLP Recommendations Recommendations for follow up therapy are one component of a multi-disciplinary discharge planning process, led by the attending physician.  Recommendations may be updated based on patient status, additional functional criteria and insurance authorization. Assessment: Orofacial Exam: Orofacial Exam Oral Cavity: Oral Hygiene: Dried secretions Oral  Cavity - Dentition: Adequate natural dentition Orofacial Anatomy: WFL Oral Motor/Sensory Function: WFL Anatomy: Anatomy: Suspected cervical osteophytes Boluses Administered: Boluses Administered Boluses Administered: Thin liquids (Level 0); Mildly thick liquids (Level 2, nectar thick); Moderately thick liquids (Level 3, honey thick); Puree; Solid  Oral Impairment Domain: Oral Impairment Domain Lip Closure: Interlabial escape, no progression to anterior lip Tongue control during bolus hold: Escape to lateral buccal cavity/floor of mouth Bolus preparation/mastication: Timely and efficient chewing and mashing Bolus transport/lingual motion: Delayed initiation of tongue motion (oral holding) Oral residue: Complete oral clearance Location of oral residue : N/A Initiation of pharyngeal swallow : Posterior angle of the ramus  Pharyngeal Impairment Domain: Pharyngeal Impairment Domain Soft palate elevation: No bolus between soft palate (SP)/pharyngeal wall (PW) Laryngeal elevation: Complete superior movement of thyroid cartilage with complete approximation of arytenoids to epiglottic petiole Anterior hyoid excursion: Complete anterior movement Epiglottic movement: Complete inversion Laryngeal vestibule closure: Complete, no air/contrast in laryngeal vestibule Pharyngeal stripping wave : Present - complete Pharyngeal contraction (A/P view only): N/A Pharyngoesophageal segment opening: Complete distension and complete duration, no obstruction of flow Tongue base retraction: Narrow column of contrast or air between tongue base and PPW Pharyngeal residue: Trace residue within or on pharyngeal structures Location of pharyngeal residue: Tongue base; Pharyngeal wall  Esophageal Impairment Domain: Esophageal Impairment Domain Esophageal clearance upright position: Complete clearance, esophageal coating Pill: Pill Consistency administered: Thin liquids (Level 0) Thin liquids (Level 0): [facility] Penetration/Aspiration Scale Score:  Penetration/Aspiration Scale Score 1.  Material does not enter airway: Mildly thick  liquids (Level 2, nectar thick); Moderately thick liquids (Level 3, honey thick); Puree; Solid; Pill 2.  Material enters airway, remains ABOVE vocal cords then ejected out: Thin liquids (Level 0) Compensatory Strategies: Compensatory Strategies Compensatory strategies: Yes Straw: Effective Effective Straw: Thin liquid (Level 0); Mildly thick liquid (Level 2, nectar thick)   General Information: Caregiver present: No  Diet Prior to this Study: NPO   Temperature : Normal   Respiratory Status: Increased WOB   Supplemental O2: None (Room air)   History of Recent Intubation: Yes  Behavior/Cognition: Alert; Cooperative; Requires cueing Self-Feeding Abilities: Dependent for feeding Baseline vocal quality/speech: Dysphonic; Hypophonia/low volume Volitional Cough: Able to elicit Volitional Swallow: Able to elicit Exam Limitations: Fatigue Goal Planning: Prognosis for improved oropharyngeal function: Good Barriers to Reach Goals: Cognitive deficits; Time post onset No data recorded Patient/Family Stated Goal: none stated Consulted and agree with results and recommendations: Patient Pain: Pain Assessment Pain Assessment: Faces Faces Pain Scale: 2 Pain Location: open abdominal site Pain Descriptors / Indicators: Grimacing Pain Intervention(s): Monitored during session End of Session: Start Time:SLP Start Time (ACUTE ONLY): 6295 Stop Time: SLP Stop Time (ACUTE ONLY): 0943 Time Calculation:SLP Time Calculation (min) (ACUTE ONLY): 17 min Charges: SLP Evaluations $ SLP Speech Visit: 1 Visit SLP Evaluations $MBS Swallow: 1 Procedure $Swallowing Treatment: 1 Procedure SLP visit diagnosis: SLP Visit Diagnosis: Dysphagia, oropharyngeal phase (R13.12) Past Medical History: Past Medical History: Diagnosis Date  Ankle fracture   Cancer (HCC) 1974  testicular cancer-at age 88  Cataract   bilateral sx  GERD (gastroesophageal reflux disease)   hx of  Hx of  colonic polyps   pt unsure,thinks this was done in 1982 in [place]. no reports in EPIC  Hyperlipidemia   on meds  Hypertension   on meds  Kidney stones   Patella fracture   Retinal detachment   OD  Retinal tear of right eye   Syncopal episodes 2008 Past Surgical History: Past Surgical History: Procedure Laterality Date  AIR/FLUID EXCHANGE Right 06/16/2014  Procedure: AIR/FLUID EXCHANGE;  Surgeon: Sherrie George, MD;  Location: [facility] OR;  Service: Ophthalmology;  Laterality: Right;  ANGIOPLASTY Left 12/21/2022  Procedure: BALLOON ANGIOPLASTY SUPERIOR MESENTERIC ARTERY;  Surgeon: Victorino Sparrow, MD;  Location: [facility] OR;  Service: Vascular;  Laterality: Left;  APPLICATION OF WOUND VAC N/A 12/21/2022  Procedure: APPLICATION OF ABTHERA WOUND VAC;  Surgeon: Violeta Gelinas, MD;  Location: [facility] OR;  Service: General;  Laterality: N/A;  BIOPSY  04/04/2021  Procedure: BIOPSY;  Surgeon: Lemar Lofty., MD;  Location: [facility] ENDOSCOPY;  Service: Gastroenterology;;  BOWEL RESECTION  12/21/2022  Procedure: SMALL BOWEL RESECTION;  Surgeon: Violeta Gelinas, MD;  Location: [facility] OR;  Service: General;;  BOWEL RESECTION N/A 12/25/2022  Procedure: SMALL BOWEL RESECTION W/ANASTOMOSIS X2;  Surgeon: Gaynelle Adu, MD;  Location: [facility] OR;  Service: General;  Laterality: N/A;  CATARACT EXTRACTION    CATARACT EXTRACTION  09/2014  rt eye//left 01/2015  CATARACT EXTRACTION, BILATERAL  2016  per Dr. Elmer Picker   COLONOSCOPY  02/16/2020  per Dr. Rhea Belton, adenomatous polyps, repeat in 3 yrs  ENTEROSCOPY N/A 04/04/2021  Procedure: ENTEROSCOPY;  Surgeon: Mansouraty, Netty Starring., MD;  Location: [facility] ENDOSCOPY;  Service: Gastroenterology;  Laterality: N/A;  EYE SURGERY    GAS INSERTION Right 06/16/2014  Procedure: INSERTION OF GAS;  Surgeon: Sherrie George, MD;  Location: [facility] OR;  Service: Ophthalmology;  Laterality: Right;  [id number]  GIVENS CAPSULE STUDY N/A 04/02/2021  Procedure: GIVENS CAPSULE STUDY;  Surgeon: Lemar Lofty.,  MD;  Location: [facility] ENDOSCOPY;   Service: Gastroenterology;  Laterality: N/A;  HYDROCELE EXCISION    INSERTION OF ILIAC STENT N/A 12/21/2022  Procedure: INSERTION OF SUPERIOR MESENTERIC ARTERY STENT;  Surgeon: Victorino Sparrow, MD;  Location: [facility] OR;  Service: Vascular;  Laterality: N/A;  IR ANGIOGRAM VISCERAL SELECTIVE  04/04/2021  IR US GUIDE VASC ACCESS LEFT  04/04/2021  KNEE ARTHROSCOPY Right   x 2  LAPAROTOMY N/A 12/21/2022  Procedure: EXPLORATION LAPAROTOMY;  Surgeon: Violeta Gelinas, MD;  Location: [facility] OR;  Service: General;  Laterality: N/A;  LAPAROTOMY N/A 12/23/2022  Procedure: RE-EXPLORATION LAPAROTOMY ABTHERA VAC EXCHANGE;  Surgeon: Almond Lint, MD;  Location: [facility] OR;  Service: General;  Laterality: N/A;  LAPAROTOMY N/A 12/25/2022  Procedure: RE-EXPLORATION LAPAROTOMY W/CLOSURE;  Surgeon: Gaynelle Adu, MD;  Location: [facility] OR;  Service: General;  Laterality: N/A;  LASER PHOTO ABLATION Right 06/16/2014  Procedure: LASER PHOTO ABLATION;  Surgeon: Sherrie George, MD;  Location: [facility] OR;  Service: Ophthalmology;  Laterality: Right;  Headscope laser and endolaser   ORCHIECTOMY    right testicle  REPAIR OF COMPLEX TRACTION RETINAL DETACHMENT Right 06/16/2014  Procedure: REPAIR OF COMPLEX TRACTION RETINAL DETACHMENT;  Surgeon: Sherrie George, MD;  Location: [facility] OR;  Service: Ophthalmology;  Laterality: Right;  RETINAL DETACHMENT SURGERY  06/2014  SUBMUCOSAL TATTOO INJECTION  04/04/2021  Procedure: SUBMUCOSAL TATTOO INJECTION;  Surgeon: Lemar Lofty., MD;  Location: [facility] ENDOSCOPY;  Service: Gastroenterology;;  TONSILLECTOMY    ULTRASOUND GUIDANCE FOR VASCULAR ACCESS Left 12/21/2022  Procedure: ULTRASOUND GUIDANCE FOR VASCULAR ACCESS FEMORAL ARTER;  Surgeon: Victorino Sparrow, MD;  Location: [facility] OR;  Service: Vascular;  Laterality: Left;  VISCERAL ANGIOGRAM Left 12/21/2022  Procedure: SUPERIOR MESENTERIC ARTERY ANGIOGRAM;  Surgeon: Victorino Sparrow, MD;  Location: [facility] OR;  Service: Vascular;  Laterality: Left;  VITRECTOMY Right 06/16/2014  WISDOM  TOOTH EXTRACTION   Gwynneth Aliment, M.A., CF-SLP Speech Language Pathology, Acute Rehabilitation Services Secure Chat preferred [phone] 01/08/2023, 10:16 AM   Anti-infectives: Anti-infectives (From admission, onward)    Start     Dose/Rate Route Frequency Ordered Stop   12/30/22 1800  vancomycin (VANCOCIN) IVPB 1000 mg/200 mL premix        1,000 mg 200 mL/hr over 60 Minutes Intravenous Every 24 hours 12/29/22 1844 01/03/23 1946   12/30/22 0600  vancomycin (VANCOCIN) IVPB 1000 mg/200 mL premix  Status:  Discontinued        1,000 mg 200 mL/hr over 60 Minutes Intravenous Every 12 hours 12/29/22 1842 12/29/22 1844   12/29/22 2200  meropenem (MERREM) 1 g in sodium chloride 0.9 % 100 mL IVPB  Status:  Discontinued        1 g 200 mL/hr over 30 Minutes Intravenous Every 12 hours 12/29/22 1705 12/29/22 1842   12/29/22 1930  meropenem (MERREM) 1 g in sodium chloride 0.9 % 100 mL IVPB        1 g 200 mL/hr over 30 Minutes Intravenous Every 8 hours 12/29/22 1842 01/04/23 0700   12/29/22 1800  vancomycin (VANCOREADY) IVPB 2000 mg/400 mL        2,000 mg 200 mL/hr over 120 Minutes Intravenous  Once 12/29/22 1705 12/29/22 1958   12/23/22 1230  piperacillin-tazobactam (ZOSYN) IVPB 2.25 g        2.25 g 100 mL/hr over 30 Minutes Intravenous Every 8 hours 12/23/22 1139 12/28/22 2109   12/22/22 0000  piperacillin-tazobactam (ZOSYN) IVPB 3.375 g  Status:  Discontinued        3.375  g 12.5 mL/hr over 240 Minutes Intravenous Every 8 hours 12/21/22 2331 12/23/22 1139        Assessment/Plan  67 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 Karin Lieu, exploratory laparotomy and small bowel resection Dr. Janee Morn S/p re-exploration, replace abthera FB 12/23/22   S/p re-exploration, small bowel resection with anastomosis x2, assessment of bowel perfusion with ICG, abdominal closure 9/16 Dr. Andrey Campanile - Continue TPN - Advance diet as tolerated per speech  recommendations- currently CLD. Monitor bowel function.  - Suspect mucosal sloughing from recent ischemic event plus or minus anastomotic sloughing and bleeding. This will typically be self-limited.  Hemoglobin 6.0 this AM, getting 2 units PRBC. Agree with vascular to at least continue ASA and restart bivalirudin as soon as able to lessen risk of stent occlusion.  - continue wound care - fine to DC foley from general surgery standpoint   FEN: CLD, TPN VTE: ASA PR, bivalirudin on hold currently  ID: No current abx   LOS: 19 days     Juliet Rude, [facility] Surgery 01/08/2023, 11:45 AM Please see Amion for pager number during day hours 7:00am-4:30pm

## 2023-01-09 ENCOUNTER — Inpatient Hospital Stay (HOSPITAL_COMMUNITY): Payer: Medicare Other

## 2023-01-09 DIAGNOSIS — K551 Chronic vascular disorders of intestine: Secondary | ICD-10-CM | POA: Diagnosis not present

## 2023-01-09 DIAGNOSIS — K922 Gastrointestinal hemorrhage, unspecified: Secondary | ICD-10-CM

## 2023-01-09 DIAGNOSIS — D62 Acute posthemorrhagic anemia: Secondary | ICD-10-CM | POA: Diagnosis not present

## 2023-01-09 DIAGNOSIS — R578 Other shock: Secondary | ICD-10-CM | POA: Diagnosis not present

## 2023-01-09 LAB — CBC
HCT: 18.2 % — ABNORMAL LOW (ref 39.0–52.0)
HCT: 22.3 % — ABNORMAL LOW (ref 39.0–52.0)
HCT: 24 % — ABNORMAL LOW (ref 39.0–52.0)
Hemoglobin: 6.3 g/dL — CL (ref 13.0–17.0)
Hemoglobin: 7.5 g/dL — ABNORMAL LOW (ref 13.0–17.0)
Hemoglobin: 8.2 g/dL — ABNORMAL LOW (ref 13.0–17.0)
MCH: 29.3 pg (ref 26.0–34.0)
MCH: 30.4 pg (ref 26.0–34.0)
MCH: 30.3 pg (ref 26.0–34.0)
MCHC: 34.6 g/dL (ref 30.0–36.0)
MCHC: 33.6 g/dL (ref 30.0–36.0)
MCHC: 34.2 g/dL (ref 30.0–36.0)
MCV: 84.7 fL (ref 80.0–100.0)
MCV: 90.3 fL (ref 80.0–100.0)
MCV: 88.6 fL (ref 80.0–100.0)
Platelets: 187 10*3/uL (ref 150–400)
Platelets: 172 10*3/uL (ref 150–400)
Platelets: 161 10*3/uL (ref 150–400)
RBC: 2.15 MIL/uL — ABNORMAL LOW (ref 4.22–5.81)
RBC: 2.47 MIL/uL — ABNORMAL LOW (ref 4.22–5.81)
RBC: 2.71 MIL/uL — ABNORMAL LOW (ref 4.22–5.81)
RDW: 15.9 % — ABNORMAL HIGH (ref 11.5–15.5)
RDW: 17.1 % — ABNORMAL HIGH (ref 11.5–15.5)
RDW: 16.5 % — ABNORMAL HIGH (ref 11.5–15.5)
WBC: 13.1 10*3/uL — ABNORMAL HIGH (ref 4.0–10.5)
WBC: 13.5 10*3/uL — ABNORMAL HIGH (ref 4.0–10.5)
WBC: 14.4 10*3/uL — ABNORMAL HIGH (ref 4.0–10.5)
nRBC: 0 % (ref 0.0–0.2)
nRBC: 0 % (ref 0.0–0.2)
nRBC: 0 % (ref 0.0–0.2)

## 2023-01-09 LAB — LACTIC ACID, PLASMA
Lactic Acid, Venous: 1 mmol/L (ref 0.5–1.9)
Lactic Acid, Venous: 1.2 mmol/L (ref 0.5–1.9)

## 2023-01-09 LAB — RENAL FUNCTION PANEL
Albumin: 1.6 g/dL — ABNORMAL LOW (ref 3.5–5.0)
Anion gap: 13 (ref 5–15)
BUN: 119 mg/dL — ABNORMAL HIGH (ref 8–23)
CO2: 12 mmol/L — ABNORMAL LOW (ref 22–32)
Calcium: 7.6 mg/dL — ABNORMAL LOW (ref 8.9–10.3)
Chloride: 117 mmol/L — ABNORMAL HIGH (ref 98–111)
Creatinine, Ser: 2.77 mg/dL — ABNORMAL HIGH (ref 0.61–1.24)
GFR, Estimated: 24 mL/min — ABNORMAL LOW (ref >60)
Glucose, Bld: 156 mg/dL — ABNORMAL HIGH (ref 70–99)
Phosphorus: 4.6 mg/dL (ref 2.5–4.6)
Potassium: 4.4 mmol/L (ref 3.5–5.1)
Sodium: 142 mmol/L (ref 135–145)

## 2023-01-09 LAB — APTT: aPTT: 23 s — ABNORMAL LOW (ref 24–36)

## 2023-01-09 LAB — PREPARE RBC (CROSSMATCH)

## 2023-01-09 LAB — GLUCOSE, CAPILLARY
Glucose-Capillary: 146 mg/dL — ABNORMAL HIGH (ref 70–99)
Glucose-Capillary: 139 mg/dL — ABNORMAL HIGH (ref 70–99)
Glucose-Capillary: 169 mg/dL — ABNORMAL HIGH (ref 70–99)
Glucose-Capillary: 155 mg/dL — ABNORMAL HIGH (ref 70–99)

## 2023-01-09 LAB — POCT I-STAT EG7
Acid-base deficit: 12 mmol/L — ABNORMAL HIGH (ref 0.0–2.0)
Bicarbonate: 12.3 mmol/L — ABNORMAL LOW (ref 20.0–28.0)
Calcium, Ion: 1.18 mmol/L (ref 1.15–1.40)
HCT: 17 % — ABNORMAL LOW (ref 39.0–52.0)
Hemoglobin: 5.8 g/dL — CL (ref 13.0–17.0)
O2 Saturation: 78 %
Patient temperature: 98.1
Potassium: 4.1 mmol/L (ref 3.5–5.1)
Sodium: 142 mmol/L (ref 135–145)
TCO2: 13 mmol/L — ABNORMAL LOW (ref 22–32)
pCO2, Ven: 21.5 mm[Hg] — ABNORMAL LOW (ref 44–60)
pH, Ven: 7.364 (ref 7.25–7.43)
pO2, Ven: 42 mm[Hg] (ref 32–45)

## 2023-01-09 LAB — MAGNESIUM: Magnesium: 2 mg/dL (ref 1.7–2.4)

## 2023-01-09 LAB — HEMOGLOBIN AND HEMATOCRIT, BLOOD
HCT: 17.7 % — ABNORMAL LOW (ref 39.0–52.0)
Hemoglobin: 5.8 g/dL — CL (ref 13.0–17.0)

## 2023-01-09 MED ORDER — SODIUM CHLORIDE 0.9% IV SOLUTION: Freq: Once | INTRAVENOUS | Status: AC

## 2023-01-09 MED ORDER — SODIUM BICARBONATE 8.4 % IV SOLN
50.0000 meq | Freq: Once | INTRAVENOUS | Status: AC
  Administered 2023-01-09: 50 meq via INTRAVENOUS
  Filled 2023-01-09: qty 50

## 2023-01-09 MED ORDER — TRAVASOL 10 % IV SOLN
INTRAVENOUS | Status: AC
  Filled 2023-01-09: qty 1404

## 2023-01-09 MED ORDER — GERHARDT'S BUTT CREAM
TOPICAL_CREAM | Freq: Every day | CUTANEOUS | Status: DC
  Administered 2023-01-15: 1 via TOPICAL
  Filled 2023-01-09: qty 1

## 2023-01-09 MED ORDER — STERILE WATER FOR INJECTION IV SOLN
INTRAVENOUS | Status: AC
  Filled 2023-01-09: qty 150

## 2023-01-09 MED ORDER — ALBUMIN HUMAN 25 % IV SOLN
12.5000 g | Freq: Once | INTRAVENOUS | Status: AC
  Administered 2023-01-09: 12.5 g via INTRAVENOUS
  Filled 2023-01-09: qty 50

## 2023-01-09 MED ORDER — SODIUM CHLORIDE 0.9 % IV SOLN
20.0000 ug | Freq: Once | INTRAVENOUS | Status: AC
  Administered 2023-01-09: 20 ug via INTRAVENOUS
  Filled 2023-01-09: qty 5

## 2023-01-09 NOTE — H&P (View-Only) (Signed)
Progress Note   Assessment    67 year old male with complicated past medical and gastrointestinal history including history of malignant small bowel GIST with resection and 2023, prior GI bleeds with history of duodenal ulcers and intestinal angioectasias, diverticulosis with previous hemorrhage, history of gastritis and duodenitis  --This hospitalization: SMA thrombus; sma and ima occlusion -- OR with necrotic jejunum s/p resection; SMA stent in place --Recurrent GI bleeding with ongoing transfusion need  Recommendations   GI bleeding -- unclear source; small bowel most likely with recent ischemic injury and surgical anastomosis x 2; cannot exclude PUD bleed (had duodenal ulcers before) or diverticular hemorrhage from colon.  Renal function tolerated last CTA poorly.  Discussed with surgical and critical care colleagues --Tagged red cell scan now --Hold anticoagulation and antiplatelet therapy --If continued bleeding maybe nuc study will point in direct for endoscopy (upper versus lower) --If nuc scan negative and still bleeding with pursue EGD tomorrow and colon thereafter if needed --Follow-up Hgb after transfusions today --Also getting DDAVP   Chief Complaint   Transferred to ICU Ongoing bloody BMs with clots No pain No N/V No fever  Vital signs in last 24 hours: Temp:  [97.2 F (36.2 C)-101.2 F (38.4 C)] 98.1 F (36.7 C) (10/01 1302) Pulse Rate:  [82-97] 90 (10/01 1315) Resp:  [19-31] 20 (10/01 1315) BP: (81-154)/(47-75) 102/62 (10/01 1315) SpO2:  [92 %-100 %] 100 % (10/01 1315) Weight:  [102.1 kg] 102.1 kg (10/01 0428) Last BM Date : 01/08/23  General: Alert, well-developed, in NAD Heart:  Regular rate and rhythm; no murmurs Chest: Clear to ascultation bilaterally Abdomen:  Soft, nontender and nondistended. Normal bowel sounds, without guarding, and without rebound.   Extremities:  Without edema. Neurologic:  Alert and  oriented x4; grossly normal  neurologically. Psych:  Alert and cooperative. Normal mood and affect.  Intake/Output from previous day: 09/30 0701 - 10/01 0700 In: 2560.3 [I.V.:2178.3; Blood:332; IV Piggyback:50] Out: 1300 [Urine:1300] Intake/Output this shift: Total I/O In: 1600.5 [I.V.:1279.2; Blood:321.3] Out: -   Lab Results: Recent Labs    01/07/23 1818 01/07/23 2207 01/08/23 0500 01/08/23 1252 01/09/23 0452 01/09/23 0856 01/09/23 1132  WBC 13.9*  --  9.8  --  13.1*  --   --   HGB 7.3*   < > 6.0*   < > 6.3* 5.8* 5.8*  HCT 22.0*   < > 19.0*   < > 18.2* 17.7* 17.0*  PLT 226  --  196  --  187  --   --    < > = values in this interval not displayed.   BMET Recent Labs    01/07/23 0545 01/08/23 0500 01/09/23 0452 01/09/23 1132  NA 143 136 142 142  K 4.3 3.8 4.4 4.1  CL 113* 113* 117*  --   CO2 17* 16* 12*  --   GLUCOSE 174* 214* 156*  --   BUN 97* 102* 119*  --   CREATININE 2.34* 2.24* 2.77*  --   CALCIUM 7.7* 7.4* 7.6*  --    LFT Recent Labs    01/07/23 0545 01/08/23 0500 01/09/23 0452  PROT 4.7* 4.3*  --   ALBUMIN 1.8* 1.6* 1.6*  AST 155* 79*  --   ALT 305* 198*  --   ALKPHOS 128* 96  --   BILITOT 0.5 0.6  --   BILIDIR 0.2  --   --   IBILI 0.3  --   --       LOS: 20  days   Beverley Fiedler, MD 01/09/2023, 2:39 PM See Loretha Stapler, [facility], to contact our on call provider

## 2023-01-09 NOTE — Progress Notes (Addendum)
PROGRESS NOTE    [name] [name]  ZOX:[id number] DOB: [birth year] DOA: 12/20/2022 PCP: [name], MD   Brief Narrative: [name] [name] is a 67 y.o. male with a history of GERD, hyperlipidemia, hypertension.  Patient presented secondary to persistent abdominal pain with concern for superior mesenteric artery thrombus and resultant mesenteric ischemia.  CTA abdomen confirmed acute occlusion of the distal SMA in addition to occlusion of the inferior mesenteric artery.  Patient was taken emergently to the OR by general surgery and vascular surgery on 9/12 for exploratory laparotomy with small bowel resection in addition to percutaneous section of thrombectomy to the SMA, balloon angioplasty of the SMA and stenting of SMA.  Patient required continued intubation and ventilator support postop and was admitted to the ICU for management.  Patient required return to the operating room on 9/14 for reexploratory laparotomy and evaluation of SMA with evidence of ischemic but not frankly necrotic bowel.  Patient returned to the operating room for the third time on 9/16 for reexploratory laparotomy with small bowel resection with anastomosis x 2.  During hospitalization, patient was also managed for atrial fibrillation with amiodarone and return to sinus rhythm.  He also developed AKI and nephrology was consulted.  Patient required intermittent hemodialysis in addition to a course of CRRT while in the ICU.  Patient transferred out of the ICU on 9/27.   Assessment and Plan:  Acute mesenteric ischemia Confirmed on CTA abdomen and pelvis with evidence of occlusion of the distal superior mesenteric artery and inferior mesenteric artery.  Patient required return to operating room x 3 for exploratory laparotomy, angioplasty/stent of SMA, bowel resection with anastomosis.  General surgery removed NG tube on 9/27 with plan to advance diet as able pending speech therapy recommendations. Argatroban and aspirin held  secondary to ongoing GI bleeding -Surgery recommendations: advance diet as tolerated/per SLP; pending today -Vascular surgery recommendations: pending today  Acute respiratory failure with hypoxia Post-operative. Patient remained intubated post surgery on 9/12 and managed in ICU via mechanical ventilation. Patient weaned off ventilator support and extubated on 9/22. Patient weaned to room air. Resolved.  Acute metabolic encephalopathy During ICU admission. Resolved.  AKI on CKD stage IIIb Baseline creatinine around 2.0. Nephrology consulted. Concern for ischemic ATN secondary to surgery, IV contrast, and hypotension. Intermittent hemodialysis started on 9/18 with transition to CRRT on 9/20 while in ICU. CRRT was discontinued on 9/24 secondary to recurrent clotting. Hemodialysis held secondary to improving urine output. Renal function now worsening with associated worsening metabolic acidosis. -Nephrology recommendations: pending today  Hypernatremia Associated poor oral intake and renal failure. Sodium improved with initiation of D5 water IV fluids. Hypernatremia resolved.  Black stools Hematochezia Concern for melena with evidence of hematochezia and confirmed by positive FOBT.Marland Kitchen Complicated by recent abdominal surgeries and argatroban use. Patient reports a history of GI bleed in the past. Per vascular surgery, continuing anticoagulation is critical for patient's survival. Frequency of blood per rectum has worsened while not on argatroban. GI evaluated with recommendation for no endoscopic evaluation at this time. CTA ordered for persistent bleeding with hypotension. -Follow-up CTA GI Bleed test  Hypokalemia Potassium supplementation per pharmacy while on TPN  Hypomagnesemia Magnesium supplementation per pharmacy while on TPN  Hypocalcemia Normal when corrected for hypoalbuminemia.  Postoperative anemia Acute blood loss anemia Hemorrhagic shock Hemoglobin of 13.4 on admission.  Patient has required a total of 7 units of PRBC via transfusion this admission to date related to multiple surgeries and continued GI bleeding.  Complicated by anticoagulation use. Patient developed melena/hematochezia on 9/28. Hemoglobin has drifted downwards. Hemoglobin of 6.3 g/dL today after another 2 units of PRBC on 9/30. BUN elevated. Shock from ongoing hemorrhage overnight on 9/30 that responded to IV fluids. PCCM consulted and signed off. -Continue Protonix -H&H q8 hours -CBC in AM -Transfuse 2 units PRBC today; post-transfusion H&H -Re-consult PCCM  Addendum: patient transferred to ICU, 2 Heart unit  Severe malnutrition Patient started on TPN. SLP evaluated on 9/27 with recommendation for NPO with ice chips only. -Continue TPN per general surgery -Ongoing SLP eval/recommendations  Primary hypertension Uncontrolled. Antihypertensives held.  GERD -Continue Protonix  Hyperlipidemia Patient is on Crestor as an outpatient which is held.  Transient atrial fibrillation Patient managed on amiodarone with conversion back to sinus rhythm. Amiodarone continued with plan to transition to oral.  Pressure injury Right buttocks. Unclear if present on admission.    DVT prophylaxis: SCDs Code Status:   Code Status: Full Code Family Communication: None at bedside Disposition Plan: Discharge likely to inpatient rehabilitation once able to wean TPN, advance PO diet and pending ongoing general surgery, vascular surgery and nephrology recommendations   Consultants:  PCCM General surgery Nephrology Interventional radiology  Procedures:    Antimicrobials: Zosyn Vancomycin Meropenem    Subjective: Patient feels well this morning. No significant bleeding since stopping argatroban.  Objective: BP (!) 111/51 (BP Location: Right Arm)   Pulse 86   Temp (!) 97.2 F (36.2 C) (Oral)   Resp 20   Ht 5\' 11"  (1.803 m)   Wt 102.1 kg   SpO2 92%   BMI 31.39 kg/m    Examination:  General exam: Appears calm and comfortable. Respiratory system: Clear to auscultation. Respiratory effort normal. Cardiovascular system: S1 & S2 heard, RRR. Gastrointestinal system: Abdomen is nondistended, soft and nontender. Normal bowel sounds heard. Central nervous system: Alert and oriented. Skin: pale Psychiatry: Judgement and insight appear normal. Mood & affect appropriate.    Data Reviewed: I have personally reviewed following labs and imaging studies  CBC Lab Results  Component Value Date   WBC 13.1 (H) 01/09/2023   RBC 2.15 (L) 01/09/2023   HGB 6.3 (LL) 01/09/2023   HCT 18.2 (L) 01/09/2023   MCV 84.7 01/09/2023   MCH 29.3 01/09/2023   PLT 187 01/09/2023   MCHC 34.6 01/09/2023   RDW 15.9 (H) 01/09/2023   LYMPHSABS 0.9 10/30/2022   MONOABS 0.6 10/30/2022   EOSABS 0.3 10/30/2022   BASOSABS 0.0 10/30/2022     Last metabolic panel Lab Results  Component Value Date   NA 142 01/09/2023   K 4.4 01/09/2023   CL 117 (H) 01/09/2023   CO2 12 (L) 01/09/2023   BUN 119 (H) 01/09/2023   CREATININE 2.77 (H) 01/09/2023   GLUCOSE 156 (H) 01/09/2023   GFRNONAA 24 (L) 01/09/2023   GFRAA >60 04/14/2015   CALCIUM 7.6 (L) 01/09/2023   PHOS 4.6 01/09/2023   PROT 4.3 (L) 01/08/2023   ALBUMIN 1.6 (L) 01/09/2023   BILITOT 0.6 01/08/2023   ALKPHOS 96 01/08/2023   AST 79 (H) 01/08/2023   ALT 198 (H) 01/08/2023   ANIONGAP 13 01/09/2023    GFR: Estimated Creatinine Clearance: 31 mL/min (A) (by C-G formula based on SCr of 2.77 mg/dL (H)).  No results found for this or any previous visit (from the past 240 hour(s)).    Radiology Studies: DG Swallowing Func-Speech Pathology  Result Date: 01/08/2023 Table formatting from the original result was not included. Modified Barium Swallow Study  Patient Details Name: [name] [name] MRN: [id number] Date of Birth: 1956/02/10 Today's Date: 01/08/2023 HPI/PMH: HPI: Pt is a 67 yo male presenting to ED from OP vascular surgery  office with known mesenteric artery stenosis s/p SMA angiogram. Admitted 9/11 with several week history of abdominal pain with 30 lb weight loss. W/u included mesenteric ischemia and necrotic area of jejunum s/p exploratory laparotomy and small bowel resection 9/12, reopening of laparotomy 9/14, second reopening and small bowel resection with anastomosis x2 9/26. Intubated 9/12-9/22. PMH includes testicular cancer, HTN, retinal detachment, GERD, HTN, HLD Clinical Impression: Clinical Impression: Pt presents with a suspected cognitively based oral dysphagia, although his swallow is overall functional. Suspect the taste of the barium contributed to oral holding and gagging, although oral holding of purees was also noted at bedside. Only one instance of trace, transient penetration occurred as pt was attempting to swallow the pill with thin liquids and seemingly gagged. He exhibited great airway protection throughout this event and no further penetration/aspiration was observed throughout trials of nectar or honey thick liquids, purees, and solids. Pt required frequent breaks and had increased WOB at times. Given pt's fatigue and cognition, recommend initiating a diet of full liquids with good potential to upgrade as mentation improves. He will require total assistance for feeding. Question pt's ability to attend throughout the course of a meal and achieve adequate oral intake at this time, therefore, he may require continued TPN until adequate intake is achieved. Will continue to follow to target diet toleration and upgrade as able. Factors that may increase risk of adverse event in presence of aspiration Rubye Oaks [employer] Clearance Coots 2021): Factors that may increase risk of adverse event in presence of aspiration Rubye Oaks & Clearance Coots 2021): Respiratory or GI disease; Reduced cognitive function; Frail or deconditioned; Dependence for feeding and/or oral hygiene Recommendations/Plan: Swallowing Evaluation Recommendations Swallowing  Evaluation Recommendations Recommendations: PO diet PO Diet Recommendation: Full liquid diet Liquid Administration via: Spoon; Cup; Straw Medication Administration: Whole meds with puree Supervision: Full assist for feeding; Full supervision/cueing for swallowing strategies Swallowing strategies  : Minimize environmental distractions; Slow rate; Small bites/sips Postural changes: Position pt fully upright for meals Oral care recommendations: Oral care BID (2x/day); Staff/trained caregiver to provide oral care Treatment Plan Treatment Plan Treatment recommendations: Therapy as outlined in treatment plan below Follow-up recommendations: Acute inpatient rehab (3 hours/day) Functional status assessment: Patient has had a recent decline in their functional status and demonstrates the ability to make significant improvements in function in a reasonable and predictable amount of time. Treatment frequency: Min 2x/week Treatment duration: 2 weeks Interventions: Aspiration precaution training; Oropharyngeal exercises; Patient/family education; Trials of upgraded texture/liquids; Diet toleration management by SLP Recommendations Recommendations for follow up therapy are one component of a multi-disciplinary discharge planning process, led by the attending physician.  Recommendations may be updated based on patient status, additional functional criteria and insurance authorization. Assessment: Orofacial Exam: Orofacial Exam Oral Cavity: Oral Hygiene: Dried secretions Oral Cavity - Dentition: Adequate natural dentition Orofacial Anatomy: WFL Oral Motor/Sensory Function: WFL Anatomy: Anatomy: Suspected cervical osteophytes Boluses Administered: Boluses Administered Boluses Administered: Thin liquids (Level 0); Mildly thick liquids (Level 2, nectar thick); Moderately thick liquids (Level 3, honey thick); Puree; Solid  Oral Impairment Domain: Oral Impairment Domain Lip Closure: Interlabial escape, no progression to anterior lip  Tongue control during bolus hold: Escape to lateral buccal cavity/floor of mouth Bolus preparation/mastication: Timely and efficient chewing and mashing Bolus transport/lingual motion: Delayed initiation of tongue motion (oral holding) Oral residue: Complete  oral clearance Location of oral residue : N/A Initiation of pharyngeal swallow : Posterior angle of the ramus  Pharyngeal Impairment Domain: Pharyngeal Impairment Domain Soft palate elevation: No bolus between soft palate (SP)/pharyngeal wall (PW) Laryngeal elevation: Complete superior movement of thyroid cartilage with complete approximation of arytenoids to epiglottic petiole Anterior hyoid excursion: Complete anterior movement Epiglottic movement: Complete inversion Laryngeal vestibule closure: Complete, no air/contrast in laryngeal vestibule Pharyngeal stripping wave : Present - complete Pharyngeal contraction (A/P view only): N/A Pharyngoesophageal segment opening: Complete distension and complete duration, no obstruction of flow Tongue base retraction: Narrow column of contrast or air between tongue base and PPW Pharyngeal residue: Trace residue within or on pharyngeal structures Location of pharyngeal residue: Tongue base; Pharyngeal wall  Esophageal Impairment Domain: Esophageal Impairment Domain Esophageal clearance upright position: Complete clearance, esophageal coating Pill: Pill Consistency administered: Thin liquids (Level 0) Thin liquids (Level 0): [facility] Penetration/Aspiration Scale Score: Penetration/Aspiration Scale Score 1.  Material does not enter airway: Mildly thick liquids (Level 2, nectar thick); Moderately thick liquids (Level 3, honey thick); Puree; Solid; Pill 2.  Material enters airway, remains ABOVE vocal cords then ejected out: Thin liquids (Level 0) Compensatory Strategies: Compensatory Strategies Compensatory strategies: Yes Straw: Effective Effective Straw: Thin liquid (Level 0); Mildly thick liquid (Level 2, nectar thick)   General  Information: Caregiver present: No  Diet Prior to this Study: NPO   Temperature : Normal   Respiratory Status: Increased WOB   Supplemental O2: None (Room air)   History of Recent Intubation: Yes  Behavior/Cognition: Alert; Cooperative; Requires cueing Self-Feeding Abilities: Dependent for feeding Baseline vocal quality/speech: Dysphonic; Hypophonia/low volume Volitional Cough: Able to elicit Volitional Swallow: Able to elicit Exam Limitations: Fatigue Goal Planning: Prognosis for improved oropharyngeal function: Good Barriers to Reach Goals: Cognitive deficits; Time post onset No data recorded Patient/Family Stated Goal: none stated Consulted and agree with results and recommendations: Patient Pain: Pain Assessment Pain Assessment: Faces Faces Pain Scale: 2 Pain Location: open abdominal site Pain Descriptors / Indicators: Grimacing Pain Intervention(s): Monitored during session End of Session: Start Time:SLP Start Time (ACUTE ONLY): 9604 Stop Time: SLP Stop Time (ACUTE ONLY): 0943 Time Calculation:SLP Time Calculation (min) (ACUTE ONLY): 17 min Charges: SLP Evaluations $ SLP Speech Visit: 1 Visit SLP Evaluations $MBS Swallow: 1 Procedure $Swallowing Treatment: 1 Procedure SLP visit diagnosis: SLP Visit Diagnosis: Dysphagia, oropharyngeal phase (R13.12) Past Medical History: Past Medical History: Diagnosis Date  Ankle fracture   Cancer (HCC) 1974  testicular cancer-at age 9  Cataract   bilateral sx  GERD (gastroesophageal reflux disease)   hx of  Hx of colonic polyps   pt unsure,thinks this was done in 1982 in [place]. no reports in EPIC  Hyperlipidemia   on meds  Hypertension   on meds  Kidney stones   Patella fracture   Retinal detachment   OD  Retinal tear of right eye   Syncopal episodes 2008 Past Surgical History: Past Surgical History: Procedure Laterality Date  AIR/FLUID EXCHANGE Right 06/16/2014  Procedure: AIR/FLUID EXCHANGE;  Surgeon: Sherrie George, MD;  Location: [facility] OR;  Service: Ophthalmology;  Laterality:  Right;  ANGIOPLASTY Left 12/21/2022  Procedure: BALLOON ANGIOPLASTY SUPERIOR MESENTERIC ARTERY;  Surgeon: Victorino Sparrow, MD;  Location: [facility] OR;  Service: Vascular;  Laterality: Left;  APPLICATION OF WOUND VAC N/A 12/21/2022  Procedure: APPLICATION OF ABTHERA WOUND VAC;  Surgeon: Violeta Gelinas, MD;  Location: [facility] OR;  Service: General;  Laterality: N/A;  BIOPSY  04/04/2021  Procedure: BIOPSY;  Surgeon:  Mansouraty, Netty Starring., MD;  Location: [facility] ENDOSCOPY;  Service: Gastroenterology;;  BOWEL RESECTION  12/21/2022  Procedure: SMALL BOWEL RESECTION;  Surgeon: Violeta Gelinas, MD;  Location: [facility] OR;  Service: General;;  BOWEL RESECTION N/A 12/25/2022  Procedure: SMALL BOWEL RESECTION W/ANASTOMOSIS X2;  Surgeon: Gaynelle Adu, MD;  Location[facility] OR;  Service: General;  Laterality: N/A;  CATARACT EXTRACTION    CATARACT EXTRACTION  09/2014  rt eye//left 01/2015  CATARACT EXTRACTION, BILATERAL  2016  per Dr. Elmer Picker   COLONOSCOPY  02/16/2020  per Dr. Rhea Belton, adenomatous polyps, repeat in 3 yrs  ENTEROSCOPY N/A 04/04/2021  Procedure: ENTEROSCOPY;  Surgeon: Mansouraty, Netty Starring., MD;  Location: [facility] ENDOSCOPY;  Service: Gastroenterology;  Laterality: N/A;  EYE SURGERY    GAS INSERTION Right 06/16/2014  Procedure: INSERTION OF GAS;  Surgeon: Sherrie George, MD;  Location: [facility] OR;  Service: Ophthalmology;  Laterality: Right;  [id number]  GIVENS CAPSULE STUDY N/A 04/02/2021  Procedure: GIVENS CAPSULE STUDY;  Surgeon: Lemar Lofty., MD;  Location: [facility] ENDOSCOPY;  Service: Gastroenterology;  Laterality: N/A;  HYDROCELE EXCISION    INSERTION OF ILIAC STENT N/A 12/21/2022  Procedure: INSERTION OF SUPERIOR MESENTERIC ARTERY STENT;  Surgeon: Victorino Sparrow, MD;  Location: [facility] OR;  Service: Vascular;  Laterality: N/A;  IR ANGIOGRAM VISCERAL SELECTIVE  04/04/2021  IR US GUIDE VASC ACCESS LEFT  04/04/2021  KNEE ARTHROSCOPY Right   x 2  LAPAROTOMY N/A 12/21/2022  Procedure: EXPLORATION LAPAROTOMY;  Surgeon: Violeta Gelinas, MD;  Location: [facility]  OR;  Service: General;  Laterality: N/A;  LAPAROTOMY N/A 12/23/2022  Procedure: RE-EXPLORATION LAPAROTOMY ABTHERA VAC EXCHANGE;  Surgeon: Almond Lint, MD;  Location: [facility] OR;  Service: General;  Laterality: N/A;  LAPAROTOMY N/A 12/25/2022  Procedure: RE-EXPLORATION LAPAROTOMY W/CLOSURE;  Surgeon: Gaynelle Adu, MD;  Location: [facility] OR;  Service: General;  Laterality: N/A;  LASER PHOTO ABLATION Right 06/16/2014  Procedure: LASER PHOTO ABLATION;  Surgeon: Sherrie George, MD;  Location: [facility] OR;  Service: Ophthalmology;  Laterality: Right;  Headscope laser and endolaser   ORCHIECTOMY    right testicle  REPAIR OF COMPLEX TRACTION RETINAL DETACHMENT Right 06/16/2014  Procedure: REPAIR OF COMPLEX TRACTION RETINAL DETACHMENT;  Surgeon: Sherrie George, MD;  Location: [facility] OR;  Service: Ophthalmology;  Laterality: Right;  RETINAL DETACHMENT SURGERY  06/2014  SUBMUCOSAL TATTOO INJECTION  04/04/2021  Procedure: SUBMUCOSAL TATTOO INJECTION;  Surgeon: Lemar Lofty., MD;  Location: [facility] ENDOSCOPY;  Service: Gastroenterology;;  TONSILLECTOMY    ULTRASOUND GUIDANCE FOR VASCULAR ACCESS Left 12/21/2022  Procedure: ULTRASOUND GUIDANCE FOR VASCULAR ACCESS FEMORAL ARTER;  Surgeon: Victorino Sparrow, MD;  Location: [facility] OR;  Service: Vascular;  Laterality: Left;  VISCERAL ANGIOGRAM Left 12/21/2022  Procedure: SUPERIOR MESENTERIC ARTERY ANGIOGRAM;  Surgeon: Victorino Sparrow, MD;  Location: [facility] OR;  Service: Vascular;  Laterality: Left;  VITRECTOMY Right 06/16/2014  WISDOM TOOTH EXTRACTION   Gwynneth Aliment, M.A., CF-SLP Speech Language Pathology, Acute Rehabilitation Services Secure Chat preferred [phone] 01/08/2023, 10:16 AM     LOS: 20 days    Jacquelin Hawking, MD [facility] 01/09/2023, 8:09 AM   If 7PM-7AM, please contact night-coverage [email or web address]

## 2023-01-09 NOTE — Progress Notes (Signed)
NAME:  [name] [name], MRN:  [id number], DOB:  05-19-1955, LOS: 20 ADMISSION DATE:  12/20/2022, CONSULTATION DATE: 9/12 REFERRING MD: Dr. Jacqulyn Bath, CHIEF COMPLAINT: Mesenteric ischemia  History of Present Illness:   67 year old male with past medical history as below, which is significant for intestinal stromal tumor removal in 2022, known mesenteric stenosis, GERD, hypertension, and hyperlipidemia.  In the few weeks prior to presentation on 9/11 he had been evaluated at several campuses for abdominal pain with noncontrasted CTs of the abdomen which were unrevealing.  He continued to have abdominal pain, weight loss, and poor p.o. intake.  He presented [name] on 9/11 and was admitted to the hospitalist for abdominal pain with concern for superior mesenteric artery thrombus.  He was started on heparin infusion.  Vascular surgery was consulted and ultimately a CT angiogram of the abdomen and pelvis was done 9/12 demonstrating acute occlusion of the distal SMA as well as occlusion of the inferior mesenteric artery.  He was taken to the operating room emergently and and was found to have an necrotic area of the jejunum.  This was resected, and SMA stent was placed, and the patient was left with an open abdomen.  He also remained on the mechanical ventilator postoperatively and PCCM was asked to admit for ventilator management.  Pertinent  Medical History   has a past medical history of Ankle fracture, Cancer (HCC) (1974), Cataract, GERD (gastroesophageal reflux disease), colonic polyps, Hyperlipidemia, Hypertension, Kidney stones, Patella fracture, Retinal detachment, Retinal tear of right eye, and Syncopal episodes (2008).   Significant Hospital Events: Including procedures, antibiotic start and stop dates in addition to other pertinent events   9/11 admitted with abdominal pain 9/12 CT demonstrating mesenteric occlusion, bowel resection and stent placed, to ICU on vent. Left in discontinuity  9/14  back to OR. Left in discontinuity as still concern about bowel integrity 9/16 back to OR  s sig areas of ischemia w/ 2 areas of perforation, another 66cm section of jejunum was resected and area of discontinuity was reanastomosed. Changed to argatroban post-op as unable to get heparin levels detectable  9/17 dropping RASS goal to 0 to -1, assessing for readiness to wean, serum creatinine and BUN still climbing some but making urine 9/18 iHD  9/20 rising pressor need, anemia: CTA abd: nothing emergent, transfused 9/22 extubated 9/25 poor tolerance of NGT clamping. Abx ended  9/26 trying clamping again. Adding PRN antihypertensives. Temp incr a bit, 100.6. holding off on HD and tunneled HD cath given incr UOP  9/26 transferred out of ICU 9/29 Argatroban stopped and held due to rectal bleeding 9/30 Hgb 6.1 at 1250, 2u PRBC ordered then had large bloody BM again overnight, repeat H/H pending. Was hypotensive initially to 70-80s but responded to 200cc IVF. Likely needs more blood, TRH to follow.  9/30 PCCM asked to see incase has ongoing bleeding and hypotension and needs ICU care again 10/1 ICU transfer  Interim History / Subjective:  Ongoing LGIB prompting CCM reconsult. More SOB as well.  Objective   Blood pressure 134/62, pulse 87, temperature 97.6 F (36.4 C), temperature source Oral, resp. rate (!) 24, height 5\' 11"  (1.803 m), weight 102.1 kg, SpO2 98%.        Intake/Output Summary (Last 24 hours) at 01/09/2023 1035 Last data filed at 01/09/2023 0607 Gross per 24 hour  Intake 2560.3 ml  Output 1300 ml  Net 1260.3 ml   Filed Weights   01/07/23 0500 01/08/23 0500 01/09/23 0428  Weight:  99.5 kg 103.7 kg 102.1 kg    Examination: Pale mildly tachypneic Lungs shallow inspiratory effort, clear otherwise Heart sounds regular, ext warm Abd soft, incision sites dressed without strikethrough Aox3, globally weak Global anasarca  High BUN and dropping H/H noted  Assessment & Plan:    Acute mesenteric ischemia -s/p thrombectomy and SMA stent placement with ex lap and SB resection 9/12 followed by re-exploration x 2 9/14 and 9/16.  Completed course of vanc/mero on 9/25  Postoperative GIB, ABLA ongoing Afib now NSR on Amio HTN HLD Aki on CKD 3, acidemia Physical deconditioning  Deep tissue injury - unclear if POA Worsening respiratory distress- concerning for developing pulmonary edema  - Coming to ICU for closer monitoring - Avoid coagulopathy (argatroban on hold), hypothermia (check temp), acidemia (check ABG, lactate) - Check CXR, BIPAP may help with WOB - I am pretty worried about him; IR intervention may cause more gut ischemia but may be only option if bleeding gets worse?  Will have to discuss with VVS and CCS  Best Practice (right click and "Reselect all SmartList Selections" daily)   Diet/type: TPN DVT prophylaxis: argatroban on hold GI prophylaxis: PPI Lines: Central line Foley:  Yes, and it is still needed Code Status:  full code Last date of multidisciplinary goals of care discussion [updated wife at bedside 9/22]   32 min cc time Myrla Halsted MD PCCM [facility] For pager details, please see AMION or use Epic chat  After 1900, please call [facility] for cross coverage needs 01/09/2023, 10:35 AM

## 2023-01-09 NOTE — Progress Notes (Signed)
Patient ID: [name] [name], male   DOB: [birth year], 67 y.o.   MRN: [id number] [facility] Progress Note  15 Days Post-Op  Subjective: CC-  Transferred to the ICU for closer monitoring. Ongoing GI bleed with BRBPR. Denies worsening abdominal pain, n/v. Hgb 6.3, getting 2u PRBCs  Objective: Vital signs in last 24 hours: Temp:  [97.2 F (36.2 C)-101.2 F (38.4 C)] 97.6 F (36.4 C) (10/01 0912) Pulse Rate:  [82-93] 87 (10/01 0912) Resp:  [19-31] 24 (10/01 0912) BP: (81-154)/(47-75) 134/62 (10/01 0912) SpO2:  [92 %-100 %] 98 % (10/01 0912) Weight:  [102.1 kg] 102.1 kg (10/01 0428) Last BM Date : 01/08/23  Intake/Output from previous day: 09/30 0701 - 10/01 0700 In: 2560.3 [I.V.:2178.3; Blood:332; IV Piggyback:50] Out: 1300 [Urine:1300] Intake/Output this shift: No intake/output data recorded.  PE: Gen: alert, NAD Pulm: respirations unlabored  Abd: mildly distended but soft, no TTP, midline incision open at skin, wound is clean and dry.  Lab Results:  Recent Labs    01/08/23 0500 01/08/23 1252 01/09/23 0452 01/09/23 0856 01/09/23 1132  WBC 9.8  --  13.1*  --   --   HGB 6.0*   < > 6.3* 5.8* 5.8*  HCT 19.0*   < > 18.2* 17.7* 17.0*  PLT 196  --  187  --   --    < > = values in this interval not displayed.   BMET Recent Labs    01/08/23 0500 01/09/23 0452 01/09/23 1132  NA 136 142 142  K 3.8 4.4 4.1  CL 113* 117*  --   CO2 16* 12*  --   GLUCOSE 214* 156*  --   BUN 102* 119*  --   CREATININE 2.24* 2.77*  --   CALCIUM 7.4* 7.6*  --    PT/INR No results for input(s): "LABPROT", "INR" in the last 72 hours. CMP     Component Value Date/Time   NA 142 01/09/2023 1132   K 4.1 01/09/2023 1132   CL 117 (H) 01/09/2023 0452   CO2 12 (L) 01/09/2023 0452   GLUCOSE 156 (H) 01/09/2023 0452   BUN 119 (H) 01/09/2023 0452   CREATININE 2.77 (H) 01/09/2023 0452   CALCIUM 7.6 (L) 01/09/2023 0452   PROT 4.3 (L) 01/08/2023 0500   PROT 6.3 03/16/2020 0800    ALBUMIN 1.6 (L) 01/09/2023 0452   ALBUMIN 4.3 03/16/2020 0800   AST 79 (H) 01/08/2023 0500   ALT 198 (H) 01/08/2023 0500   ALKPHOS 96 01/08/2023 0500   BILITOT 0.6 01/08/2023 0500   BILITOT 0.8 03/16/2020 0800   GFRNONAA 24 (L) 01/09/2023 0452   GFRAA >60 04/14/2015 1127   Lipase     Component Value Date/Time   LIPASE 28 11/27/2022 0755       Studies/Results: DG Swallowing Func-Speech Pathology  Result Date: 01/08/2023 Table formatting from the original result was not included. Modified Barium Swallow Study Patient Details Name: [name] MRN: [id number] Date of Birth: 09-27-1955 Today's Date: 01/08/2023 HPI/PMH: HPI: Pt is a 67 yo male presenting to ED from OP vascular surgery office with known mesenteric artery stenosis s/p SMA angiogram. Admitted 9/11 with several week history of abdominal pain with 30 lb weight loss. W/u included mesenteric ischemia and necrotic area of jejunum s/p exploratory laparotomy and small bowel resection 9/12, reopening of laparotomy 9/14, second reopening and small bowel resection with anastomosis x2 9/26. Intubated 9/12-9/22. PMH includes testicular cancer, HTN, retinal detachment, GERD, HTN, HLD Clinical Impression: Clinical Impression:  Pt presents with a suspected cognitively based oral dysphagia, although his swallow is overall functional. Suspect the taste of the barium contributed to oral holding and gagging, although oral holding of purees was also noted at bedside. Only one instance of trace, transient penetration occurred as pt was attempting to swallow the pill with thin liquids and seemingly gagged. He exhibited great airway protection throughout this event and no further penetration/aspiration was observed throughout trials of nectar or honey thick liquids, purees, and solids. Pt required frequent breaks and had increased WOB at times. Given pt's fatigue and cognition, recommend initiating a diet of full liquids with good potential to upgrade as  mentation improves. He will require total assistance for feeding. Question pt's ability to attend throughout the course of a meal and achieve adequate oral intake at this time, therefore, he may require continued TPN until adequate intake is achieved. Will continue to follow to target diet toleration and upgrade as able. Factors that may increase risk of adverse event in presence of aspiration Rubye Oaks [employer] Clearance Coots 2021): Factors that may increase risk of adverse event in presence of aspiration Rubye Oaks [employer] Clearance Coots 2021): Respiratory or GI disease; Reduced cognitive function; Frail or deconditioned; Dependence for feeding and/or oral hygiene Recommendations/Plan: Swallowing Evaluation Recommendations Swallowing Evaluation Recommendations Recommendations: PO diet PO Diet Recommendation: Full liquid diet Liquid Administration via: Spoon; Cup; Straw Medication Administration: Whole meds with puree Supervision: Full assist for feeding; Full supervision/cueing for swallowing strategies Swallowing strategies  : Minimize environmental distractions; Slow rate; Small bites/sips Postural changes: Position pt fully upright for meals Oral care recommendations: Oral care BID (2x/day); Staff/trained caregiver to provide oral care Treatment Plan Treatment Plan Treatment recommendations: Therapy as outlined in treatment plan below Follow-up recommendations: Acute inpatient rehab (3 hours/day) Functional status assessment: Patient has had a recent decline in their functional status and demonstrates the ability to make significant improvements in function in a reasonable and predictable amount of time. Treatment frequency: Min 2x/week Treatment duration: 2 weeks Interventions: Aspiration precaution training; Oropharyngeal exercises; Patient/family education; Trials of upgraded texture/liquids; Diet toleration management by SLP Recommendations Recommendations for follow up therapy are one component of a multi-disciplinary discharge planning  process, led by the attending physician.  Recommendations may be updated based on patient status, additional functional criteria and insurance authorization. Assessment: Orofacial Exam: Orofacial Exam Oral Cavity: Oral Hygiene: Dried secretions Oral Cavity - Dentition: Adequate natural dentition Orofacial Anatomy: WFL Oral Motor/Sensory Function: WFL Anatomy: Anatomy: Suspected cervical osteophytes Boluses Administered: Boluses Administered Boluses Administered: Thin liquids (Level 0); Mildly thick liquids (Level 2, nectar thick); Moderately thick liquids (Level 3, honey thick); Puree; Solid  Oral Impairment Domain: Oral Impairment Domain Lip Closure: Interlabial escape, no progression to anterior lip Tongue control during bolus hold: Escape to lateral buccal cavity/floor of mouth Bolus preparation/mastication: Timely and efficient chewing and mashing Bolus transport/lingual motion: Delayed initiation of tongue motion (oral holding) Oral residue: Complete oral clearance Location of oral residue : N/A Initiation of pharyngeal swallow : Posterior angle of the ramus  Pharyngeal Impairment Domain: Pharyngeal Impairment Domain Soft palate elevation: No bolus between soft palate (SP)/pharyngeal wall (PW) Laryngeal elevation: Complete superior movement of thyroid cartilage with complete approximation of arytenoids to epiglottic petiole Anterior hyoid excursion: Complete anterior movement Epiglottic movement: Complete inversion Laryngeal vestibule closure: Complete, no air/contrast in laryngeal vestibule Pharyngeal stripping wave : Present - complete Pharyngeal contraction (A/P view only): N/A Pharyngoesophageal segment opening: Complete distension and complete duration, no obstruction of flow Tongue base retraction: Narrow column  of contrast or air between tongue base and PPW Pharyngeal residue: Trace residue within or on pharyngeal structures Location of pharyngeal residue: Tongue base; Pharyngeal wall  Esophageal  Impairment Domain: Esophageal Impairment Domain Esophageal clearance upright position: Complete clearance, esophageal coating Pill: Pill Consistency administered: Thin liquids (Level 0) Thin liquids (Level 0): [facility] Penetration/Aspiration Scale Score: Penetration/Aspiration Scale Score 1.  Material does not enter airway: Mildly thick liquids (Level 2, nectar thick); Moderately thick liquids (Level 3, honey thick); Puree; Solid; Pill 2.  Material enters airway, remains ABOVE vocal cords then ejected out: Thin liquids (Level 0) Compensatory Strategies: Compensatory Strategies Compensatory strategies: Yes Straw: Effective Effective Straw: Thin liquid (Level 0); Mildly thick liquid (Level 2, nectar thick)   General Information: Caregiver present: No  Diet Prior to this Study: NPO   Temperature : Normal   Respiratory Status: Increased WOB   Supplemental O2: None (Room air)   History of Recent Intubation: Yes  Behavior/Cognition: Alert; Cooperative; Requires cueing Self-Feeding Abilities: Dependent for feeding Baseline vocal quality/speech: Dysphonic; Hypophonia/low volume Volitional Cough: Able to elicit Volitional Swallow: Able to elicit Exam Limitations: Fatigue Goal Planning: Prognosis for improved oropharyngeal function: Good Barriers to Reach Goals: Cognitive deficits; Time post onset No data recorded Patient/Family Stated Goal: none stated Consulted and agree with results and recommendations: Patient Pain: Pain Assessment Pain Assessment: Faces Faces Pain Scale: 2 Pain Location: open abdominal site Pain Descriptors / Indicators: Grimacing Pain Intervention(s): Monitored during session End of Session: Start Time:SLP Start Time (ACUTE ONLY): 4098 Stop Time: SLP Stop Time (ACUTE ONLY): 0943 Time Calculation:SLP Time Calculation (min) (ACUTE ONLY): 17 min Charges: SLP Evaluations $ SLP Speech Visit: 1 Visit SLP Evaluations $MBS Swallow: 1 Procedure $Swallowing Treatment: 1 Procedure SLP visit diagnosis: SLP Visit  Diagnosis: Dysphagia, oropharyngeal phase (R13.12) Past Medical History: Past Medical History: Diagnosis Date  Ankle fracture   Cancer (HCC) 1974  testicular cancer-at age 20  Cataract   bilateral sx  GERD (gastroesophageal reflux disease)   hx of  Hx of colonic polyps   pt unsure,thinks this was done in 1982 in [place]. no reports in EPIC  Hyperlipidemia   on meds  Hypertension   on meds  Kidney stones   Patella fracture   Retinal detachment   OD  Retinal tear of right eye   Syncopal episodes 2008 Past Surgical History: Past Surgical History: Procedure Laterality Date  AIR/FLUID EXCHANGE Right 06/16/2014  Procedure: AIR/FLUID EXCHANGE;  Surgeon: Sherrie George, MD;  Location: [facility] OR;  Service: Ophthalmology;  Laterality: Right;  ANGIOPLASTY Left 12/21/2022  Procedure: BALLOON ANGIOPLASTY SUPERIOR MESENTERIC ARTERY;  Surgeon: Victorino Sparrow, MD;  Location: [facility] OR;  Service: Vascular;  Laterality: Left;  APPLICATION OF WOUND VAC N/A 12/21/2022  Procedure: APPLICATION OF ABTHERA WOUND VAC;  Surgeon: Violeta Gelinas, MD;  Location[facility] OR;  Service: General;  Laterality: N/A;  BIOPSY  04/04/2021  Procedure: BIOPSY;  Surgeon: Lemar Lofty., MD;  Location: [facility] ENDOSCOPY;  Service: Gastroenterology;;  BOWEL RESECTION  12/21/2022  Procedure: SMALL BOWEL RESECTION;  Surgeon: Violeta Gelinas, MD;  Location: [facility] OR;  Service: General;;  BOWEL RESECTION N/A 12/25/2022  Procedure: SMALL BOWEL RESECTION W/ANASTOMOSIS X2;  Surgeon: Gaynelle Adu, MD;  Location: [facility] OR;  Service: General;  Laterality: N/A;  CATARACT EXTRACTION    CATARACT EXTRACTION  09/2014  rt eye//left 01/2015  CATARACT EXTRACTION, BILATERAL  2016  per Dr. Elmer Picker   COLONOSCOPY  02/16/2020  per Dr. Rhea Belton, adenomatous polyps, repeat in 3 yrs  ENTEROSCOPY  N/A 04/04/2021  Procedure: ENTEROSCOPY;  Surgeon: Meridee Score Netty Starring., MD;  Location: [facility] ENDOSCOPY;  Service: Gastroenterology;  Laterality: N/A;  EYE SURGERY    GAS INSERTION Right 06/16/2014  Procedure:  INSERTION OF GAS;  Surgeon: Sherrie George, MD;  Location: [facility] OR;  Service: Ophthalmology;  Laterality: Right;  [id number]  GIVENS CAPSULE STUDY N/A 04/02/2021  Procedure: GIVENS CAPSULE STUDY;  Surgeon: Lemar Lofty., MD;  Location: [facility] ENDOSCOPY;  Service: Gastroenterology;  Laterality: N/A;  HYDROCELE EXCISION    INSERTION OF ILIAC STENT N/A 12/21/2022  Procedure: INSERTION OF SUPERIOR MESENTERIC ARTERY STENT;  Surgeon: Victorino Sparrow, MD;  Location: [facility] OR;  Service: Vascular;  Laterality: N/A;  IR ANGIOGRAM VISCERAL SELECTIVE  04/04/2021  IR US GUIDE VASC ACCESS LEFT  04/04/2021  KNEE ARTHROSCOPY Right   x 2  LAPAROTOMY N/A 12/21/2022  Procedure: EXPLORATION LAPAROTOMY;  Surgeon: Violeta Gelinas, MD;  Location: [facility] OR;  Service: General;  Laterality: N/A;  LAPAROTOMY N/A 12/23/2022  Procedure: RE-EXPLORATION LAPAROTOMY ABTHERA VAC EXCHANGE;  Surgeon: Almond Lint, MD;  Location: [facility] OR;  Service: General;  Laterality: N/A;  LAPAROTOMY N/A 12/25/2022  Procedure: RE-EXPLORATION LAPAROTOMY W/CLOSURE;  Surgeon: Gaynelle Adu, MD;  Location: [facility] OR;  Service: General;  Laterality: N/A;  LASER PHOTO ABLATION Right 06/16/2014  Procedure: LASER PHOTO ABLATION;  Surgeon: Sherrie George, MD;  Location: [facility] OR;  Service: Ophthalmology;  Laterality: Right;  Headscope laser and endolaser   ORCHIECTOMY    right testicle  REPAIR OF COMPLEX TRACTION RETINAL DETACHMENT Right 06/16/2014  Procedure: REPAIR OF COMPLEX TRACTION RETINAL DETACHMENT;  Surgeon: Sherrie George, MD;  Location: [facility] OR;  Service: Ophthalmology;  Laterality: Right;  RETINAL DETACHMENT SURGERY  06/2014  SUBMUCOSAL TATTOO INJECTION  04/04/2021  Procedure: SUBMUCOSAL TATTOO INJECTION;  Surgeon: Lemar Lofty., MD;  Location: [facility] ENDOSCOPY;  Service: Gastroenterology;;  TONSILLECTOMY    ULTRASOUND GUIDANCE FOR VASCULAR ACCESS Left 12/21/2022  Procedure: ULTRASOUND GUIDANCE FOR VASCULAR ACCESS FEMORAL ARTER;  Surgeon: Victorino Sparrow, MD;  Location: [facility] OR;   Service: Vascular;  Laterality: Left;  VISCERAL ANGIOGRAM Left 12/21/2022  Procedure: SUPERIOR MESENTERIC ARTERY ANGIOGRAM;  Surgeon: Victorino Sparrow, MD;  Location: [facility] OR;  Service: Vascular;  Laterality: Left;  VITRECTOMY Right 06/16/2014  WISDOM TOOTH EXTRACTION   Gwynneth Aliment, M.A., CF-SLP Speech Language Pathology, Acute Rehabilitation Services Secure Chat preferred [phone] 01/08/2023, 10:16 AM   Anti-infectives: Anti-infectives (From admission, onward)    Start     Dose/Rate Route Frequency Ordered Stop   12/30/22 1800  vancomycin (VANCOCIN) IVPB 1000 mg/200 mL premix        1,000 mg 200 mL/hr over 60 Minutes Intravenous Every 24 hours 12/29/22 1844 01/03/23 1946   12/30/22 0600  vancomycin (VANCOCIN) IVPB 1000 mg/200 mL premix  Status:  Discontinued        1,000 mg 200 mL/hr over 60 Minutes Intravenous Every 12 hours 12/29/22 1842 12/29/22 1844   12/29/22 2200  meropenem (MERREM) 1 g in sodium chloride 0.9 % 100 mL IVPB  Status:  Discontinued        1 g 200 mL/hr over 30 Minutes Intravenous Every 12 hours 12/29/22 1705 12/29/22 1842   12/29/22 1930  meropenem (MERREM) 1 g in sodium chloride 0.9 % 100 mL IVPB        1 g 200 mL/hr over 30 Minutes Intravenous Every 8 hours 12/29/22 1842 01/04/23 0700   12/29/22 1800  vancomycin (VANCOREADY) IVPB 2000 mg/400 mL  2,000 mg 200 mL/hr over 120 Minutes Intravenous  Once 12/29/22 1705 12/29/22 1958   12/23/22 1230  piperacillin-tazobactam (ZOSYN) IVPB 2.25 g        2.25 g 100 mL/hr over 30 Minutes Intravenous Every 8 hours 12/23/22 1139 12/28/22 2109   12/22/22 0000  piperacillin-tazobactam (ZOSYN) IVPB 3.375 g  Status:  Discontinued        3.375 g 12.5 mL/hr over 240 Minutes Intravenous Every 8 hours 12/21/22 2331 12/23/22 1139        Assessment/Plan 67 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 Karin Lieu, exploratory laparotomy and small bowel resection Dr.  Janee Morn S/p re-exploration, replace abthera FB 12/23/22   S/p re-exploration, small bowel resection with anastomosis x2, assessment of bowel perfusion with ICG, abdominal closure 9/16 Dr. Andrey Campanile - Ongoing GI bleed. Anticoagulation and antiplatelets being held. He is getting 2u PRBCs. Could be bleeding from anastomosis vs diverticular, less likely upper GI bleeding. Would not recommend CTA at this point due to AKI. He is 2 weeks out from multiple abdominal surgeries and is not a good time for repeat surgical intervention. Discussed with MD, we would be ok with endoscopic evaluation by gastroenterology to help localize bleeding and potentially treat this - will reach out to GI. - Continue TPN. He is NPO. - continue wound care   FEN: NPO, TPN VTE: ASA PR, bivalirudin on hold currently  ID: No current abx    LOS: 20 days    Franne Forts, [facility] 01/09/2023, 12:13 PM Please see Amion for pager number during day hours 7:00am-4:30pm

## 2023-01-09 NOTE — Progress Notes (Signed)
NAME:  [name] [name], MRN:  [id number], DOB:  Apr 12, 1955, LOS: 20 ADMISSION DATE:  12/20/2022, CONSULTATION DATE: 9/12 REFERRING MD: Dr. Jacqulyn Bath, CHIEF COMPLAINT: Mesenteric ischemia  History of Present Illness:   67 year old male with past medical history as below, which is significant for intestinal stromal tumor removal in 2022, known mesenteric stenosis, GERD, hypertension, and hyperlipidemia.  In the few weeks prior to presentation on 9/11 he had been evaluated at several campuses for abdominal pain with noncontrasted CTs of the abdomen which were unrevealing.  He continued to have abdominal pain, weight loss, and poor p.o. intake.  He presented [name] on 9/11 and was admitted to the hospitalist for abdominal pain with concern for superior mesenteric artery thrombus.  He was started on heparin infusion.  Vascular surgery was consulted and ultimately a CT angiogram of the abdomen and pelvis was done 9/12 demonstrating acute occlusion of the distal SMA as well as occlusion of the inferior mesenteric artery.  He was taken to the operating room emergently and and was found to have an necrotic area of the jejunum.  This was resected, and SMA stent was placed, and the patient was left with an open abdomen.  He also remained on the mechanical ventilator postoperatively and PCCM was asked to admit for ventilator management.  Pertinent  Medical History   has a past medical history of Ankle fracture, Cancer (HCC) (1974), Cataract, GERD (gastroesophageal reflux disease), colonic polyps, Hyperlipidemia, Hypertension, Kidney stones, Patella fracture, Retinal detachment, Retinal tear of right eye, and Syncopal episodes (2008).   Significant Hospital Events: Including procedures, antibiotic start and stop dates in addition to other pertinent events   9/11 admitted with abdominal pain 9/12 CT demonstrating mesenteric occlusion, bowel resection and stent placed, to ICU on vent. Left in discontinuity  9/14  back to OR. Left in discontinuity as still concern about bowel integrity 9/16 back to OR  s sig areas of ischemia w/ 2 areas of perforation, another 66cm section of jejunum was resected and area of discontinuity was reanastomosed. Changed to argatroban post-op as unable to get heparin levels detectable  9/17 dropping RASS goal to 0 to -1, assessing for readiness to wean, serum creatinine and BUN still climbing some but making urine 9/18 iHD  9/20 rising pressor need, anemia: CTA abd: nothing emergent, transfused 9/22 extubated 9/25 poor tolerance of NGT clamping. Abx ended  9/26 trying clamping again. Adding PRN antihypertensives. Temp incr a bit, 100.6. holding off on HD and tunneled HD cath given incr UOP  9/26 transferred out of ICU 9/29 Argatroban stopped and held due to rectal bleeding 9/30 Hgb 6.1 at 1250, 2u PRBC ordered then had large bloody BM again overnight, repeat H/H pending. Was hypotensive initially to 70-80s but responded to 200cc IVF. Likely needs more blood, TRH to follow.  9/30 PCCM asked to see incase has ongoing bleeding and hypotension and needs ICU care again   Interim History / Subjective:  Was hypotensive to 70s and 80s earlier. Responded to 200cc IVF. 2nd unit PRBC just finished. MAP currently 78 - 80. H/H pending, TRH to follow. Aspirin held (were hoping to continue given his recent SMA stent, but now forced to hold 2/2 bleeding) Repeat CTA ordered per GI recs (might need to engage IR).  Objective   Blood pressure 101/62, pulse 86, temperature 97.7 F (36.5 C), temperature source Oral, resp. rate 20, height 5\' 11"  (1.803 m), weight 103.7 kg, SpO2 100%. CVP:  [6 mmHg] 6 mmHg  Intake/Output Summary (Last 24 hours) at 01/09/2023 0007 Last data filed at 01/08/2023 2250 Gross per 24 hour  Intake 847.29 ml  Output 1500 ml  Net -652.71 ml   Filed Weights   01/06/23 0500 01/07/23 0500 01/08/23 0500  Weight: 99.5 kg 99.5 kg 103.7 kg    Examination: General:  Adult male, feels a bit better then earlier but still not well, in NAD Neuro: A&O x 3, no deficits. HEENT: [place]/AT. Sclerae anicteric. EOMI. Cardiovascular: RRR, 3/6 SEM.  Lungs: Respirations even and unlabored.  CTA bilaterally, No W/R/R. Abdomen: Abd incision dressing C/D/I. BS hypoactive, soft, NT/ND.  Musculoskeletal: No gross deformities, no edema.  Skin: Intact, warm, no rashes. GU: hematochezia earlier tonight.  Assessment & Plan:   Acute mesenteric ischemia  -s/p thrombectomy and SMA stent placement with ex lap and SB resection 9/12 followed by re-exploration x 2 9/14 and 9/16 -completed course of vanc/mero on 9/25  P -post op per [facility]CS, VVS  -TPN, slowly advancing diet per [facility]CS  -argatroban on hold given recurrent GIB -Unfortunately ASA also held tonight given GIB, high risk for stent thrombosis unfortunately -Repeat CTA  ordered by [facility], might need to engage IR  Recurrent Hematochezia/Lower GIB - Hgb 6/1 around 1300 on 9/30, 2u PRBC ordered and just completed around 2300 ABLA - 2/2 above - TRH has ordered repeat H/H now, will likely require additional blood - Unfortunately ASA has had to be held, see above - Argatroban also on hold  Hypotension following bloody BM - resolved after minimal IVF - 1 dose Albumin now - TRH to follow H/H, wouldn't be surprised if needs more blood. Transfuse for Hgb < 7. - If has recurrent bleeding with hypotension/HD instability then would transfer to ICU for closer monitoring. Currently stable for progressive as is (discussed with pt's RN and Rapid Response RN who are both comfortable with this plan)  Afib now NSR on Amio HTN HLD P -IV amio  -AC on hold  Aki on CKD 3 Hypokalemia  P -Nephrology following -follow UOP, renal indices    Severe protein calorie malnutrition -significant wt loss PTA, now NPO  P -TPN as above  - Albumin x 1 now  Physical deconditioning  Deep tissue injury - unclear if POA -OOB to chair 9/26 -PT/OT   -WOC   OK to stay in progressive for now. Would not be surprised if he needs more blood following large bloody BM tonight. H/H just sent off and repeat CTA also ordered.  If has further/ongoing bleeding with HD instability then would transfer to ICU for closer monitoring. Discussed with RN and rapid response who are in comfortable with this plan and in agreement. Message also sent to Dr. Loney Loh with TRH.  Please call back if we can be of further assistance.  Best Practice (right click and "Reselect all SmartList Selections" daily)   Diet/type: TPN DVT prophylaxis: argatroban on hold GI prophylaxis: PPI Lines: Central line Foley:  Yes, and it is still needed Code Status:  full code Last date of multidisciplinary goals of care discussion [updated wife at bedside 9/22]   Rutherford Guys, PA - C [facility] For pager details, please see AMION or use Epic chat  After 1900, please call [facility] for cross coverage needs 01/09/2023, 12:25 AM

## 2023-01-09 NOTE — Progress Notes (Addendum)
  Progress Note    01/09/2023 6:48 AM 15 Days Post-Op  Subjective:  says he's concerned.  Tm 101.2 at 6pm now afebrile  Vitals:   01/09/23 0316 01/09/23 0400  BP: 110/62 (!) 111/51  Pulse: 86   Resp: 20 20  Temp: (!) 97.2 F (36.2 C)   SpO2: 96% 92%    Physical Exam: General:  no distress Lungs:  non labored Abdomen:  soft, NT  CBC    Component Value Date/Time   WBC 9.8 01/08/2023 0500   RBC 2.24 (L) 01/08/2023 0500   HGB 6.1 (LL) 01/08/2023 1252   HCT 19.1 (L) 01/08/2023 1252   PLT 196 01/08/2023 0500   MCV 84.8 01/08/2023 0500   MCH 26.8 01/08/2023 0500   MCHC 31.6 01/08/2023 0500   RDW 17.5 (H) 01/08/2023 0500   LYMPHSABS 0.9 10/30/2022 1654   MONOABS 0.6 10/30/2022 1654   EOSABS 0.3 10/30/2022 1654   BASOSABS 0.0 10/30/2022 1654    BMET    Component Value Date/Time   NA 142 01/09/2023 0452   K 4.4 01/09/2023 0452   CL 117 (H) 01/09/2023 0452   CO2 12 (L) 01/09/2023 0452   GLUCOSE 156 (H) 01/09/2023 0452   BUN 119 (H) 01/09/2023 0452   CREATININE 2.77 (H) 01/09/2023 0452   CALCIUM 7.6 (L) 01/09/2023 0452   GFRNONAA 24 (L) 01/09/2023 0452   GFRAA >60 04/14/2015 1127    INR    Component Value Date/Time   INR 1.7 (H) 12/29/2022 0809     Intake/Output Summary (Last 24 hours) at 01/09/2023 0648 Last data filed at 01/09/2023 0607 Gross per 24 hour  Intake 2560.3 ml  Output 1300 ml  Net 1260.3 ml      Assessment/Plan:  67 y.o. male is s/p:  SMA stenting and small bowel resection   15 Days Post-Op   -overnight events noted.  Awaiting CTA for GIB -DVT prophylaxis:  Argatroban on hold.  Jonne Ply has also been put on hold due to continued GIB with large bloody BM overnight.  Pt at risk of stent thrombosis as all anticoagulation is being held for large GIB last evening.  -acute blood loss anemia-hgb 6 yesterday am-received one PRBC and repeat CBC revealed hgb 6.1.  transfuse per primary team.   Doreatha Massed, PA-C [facility] [phone] 01/09/2023 6:48 AM  VASCULAR STAFF ADDENDUM: I have independently interviewed and examined the patient. I agree with the above.  Patient comfortable, but concerned Continues to have bloody bowel movements.  All anticoagulation antiplatelets stopped I discussed this with general surgery, and read the GI note.  Likely bleeding from the small bowel anastomosis. Am worried that contrast administration will hurt his kidneys, as the last time this occurred, he required dialysis.  I plan to discuss this with Dr. Caleb Popp.  I think he would be best treated with continued conservative therapy.  We can administer DDAVP.  Any IR procedure requiring coil embolization would likely result in more bowel ischemia.  Victorino Sparrow MD [facility] Phone Number: [phone] 01/09/2023 8:49 AM

## 2023-01-09 NOTE — Progress Notes (Signed)
Patient ID: [name] [name], male   DOB: [birth year], 67 y.o.   MRN: [id number]  Subjective:  He was moved to 2 heart. He has had 2 units of blood and is about to get a third.  GI has seen and has ordered a tagged red cell scan and per RN this is for the AM.  Spoke with his wife and daughter at bedside.   Review of systems:    Denies chest pain  Denies n/v Denies overt shortness of breath   O:BP 133/77 (BP Location: Left Arm)   Pulse 86   Temp 98.3 F (36.8 C) (Oral)   Resp 20   Ht 5\' 11"  (1.803 m)   Wt 102.1 kg   SpO2 100%   BMI 31.39 kg/m   Intake/Output Summary (Last 24 hours) at 01/09/2023 1720 Last data filed at 01/09/2023 1600 Gross per 24 hour  Intake 5126.2 ml  Output 950 ml  Net 4176.2 ml   Intake/Output: I/O last 3 completed shifts: In: 2560.3 [I.V.:2178.3; Blood:332; IV Piggyback:50] Out: 4500 [Urine:4500]  Intake/Output this shift:  Total I/O In: 2565.9 [I.V.:1879.6; Blood:636.3; IV Piggyback:50] Out: -  Weight change: -1.6 kg  General adult male in bed in no acute distress   HEENT normocephalic atraumatic extraocular movements intact sclera anicteric Neck supple trachea midline Lungs clear to auscultation bilaterally normal work of breathing at rest on room air Heart S1S2 no rub Abdomen soft nontender nondistended Extremities 1+ edema  Psych normal mood and affect Neuro - awake but tired. interactive; provides limited history - his wife supplements  Access no HD access   Recent Labs  Lab 01/03/23 0315 01/04/23 0444 01/05/23 0515 01/05/23 1253 01/06/23 0620 01/06/23 1809 01/07/23 0545 01/08/23 0500 01/09/23 0452 01/09/23 1132  NA 141 147* 153* 155* 149* 147* 143 136 142 142  K 3.3* 3.1* 3.0* 3.0* 3.3* 4.0 4.3 3.8 4.4 4.1  CL 108 112* 121* 122* 120* 116* 113* 113* 117*  --   CO2 22 22 22 24  20* 17* 17* 16* 12*  --   GLUCOSE 138* 157* 135* 165* 171* 188* 174* 214* 156*  --   BUN 100* 114* 116* 116* 104* 100* 97* 102* 119*  --   CREATININE 2.77*  2.90* 2.79* 2.70* 2.44* 2.30* 2.34* 2.24* 2.77*  --   ALBUMIN 2.0* 2.0* 1.9*  --   --   --  1.8* 1.6* 1.6*  --   CALCIUM 7.9* 7.9* 8.0* 8.1* 7.8* 7.9* 7.7* 7.4* 7.6*  --   PHOS 4.5 4.2 3.9  --  3.4  --  3.4 3.7 4.6  --   AST  --  214*  --   --   --   --  155* 79*  --   --   ALT  --  221*  --   --   --   --  305* 198*  --   --    Liver Function Tests: Recent Labs  Lab 01/04/23 0444 01/05/23 0515 01/07/23 0545 01/08/23 0500 01/09/23 0452  AST 214*  --  155* 79*  --   ALT 221*  --  305* 198*  --   ALKPHOS 150*  --  128* 96  --   BILITOT 1.0  --  0.5 0.6  --   PROT 5.0*  --  4.7* 4.3*  --   ALBUMIN 2.0*   < > 1.8* 1.6* 1.6*   < > = values in this interval not displayed.   CBC: Recent Labs  Lab 01/07/23 0545 01/07/23 1818 01/07/23 2207 01/08/23 0500 01/08/23 1252 01/09/23 0452 01/09/23 0856 01/09/23 1132 01/09/23 1527  WBC 10.1 13.9*  --  9.8  --  13.1*  --   --  13.5*  HGB 8.1* 7.3*   < > 6.0*   < > 6.3* 5.8* 5.8* 7.5*  HCT 25.9* 22.0*   < > 19.0*   < > 18.2* 17.7* 17.0* 22.3*  MCV 84.9 84.3  --  84.8  --  84.7  --   --  90.3  PLT 235 226  --  196  --  187  --   --  172   < > = values in this interval not displayed.   Cardiac Enzymes: No results for input(s): "CKTOTAL", "CKMB", "CKMBINDEX", "TROPONINI" in the last 168 hours. CBG: Recent Labs  Lab 01/08/23 0554 01/08/23 1131 01/09/23 0553 01/09/23 1142 01/09/23 1643  GLUCAP 120* 130* 146* 139* 169*    Studies/Results: DG Chest Port 1 View  Result Date: 01/09/2023 CLINICAL DATA:  Shortness of breath with abdominal pain for several days. History of hypertension. EXAM: PORTABLE CHEST 1 VIEW COMPARISON:  Radiographs 12/29/2022 and 12/23/2022. Chest CT 12/29/2022. FINDINGS: 1121 hours. Interval extubation and removal of the enteric tube. Right IJ central venous catheter has been removed. Left IJ central venous catheter appears unchanged, projecting to the mid SVC level. The heart size and mediastinal contours are  stable. There are persistent low lung volumes. The lungs appear clear. No pleural effusion or pneumothorax. The bones appear unchanged, without acute findings. Telemetry leads overlie the chest. IMPRESSION: Interval extubation and removal of the enteric tube and right IJ central venous catheter. No acute cardiopulmonary process. Electronically Signed   By: Carey Bullocks M.D.   On: 01/09/2023 13:23   DG Swallowing Func-Speech Pathology  Result Date: 01/08/2023 Table formatting from the original result was not included. Modified Barium Swallow Study Patient Details Name: [name] [name] MRN: [id number] Date of Birth: 03/02/1956 Today's Date: 01/08/2023 HPI/PMH: HPI: Pt is a 67 yo male presenting to ED from OP vascular surgery office with known mesenteric artery stenosis s/p SMA angiogram. Admitted 9/11 with several week history of abdominal pain with 30 lb weight loss. W/u included mesenteric ischemia and necrotic area of jejunum s/p exploratory laparotomy and small bowel resection 9/12, reopening of laparotomy 9/14, second reopening and small bowel resection with anastomosis x2 9/26. Intubated 9/12-9/22. PMH includes testicular cancer, HTN, retinal detachment, GERD, HTN, HLD Clinical Impression: Clinical Impression: Pt presents with a suspected cognitively based oral dysphagia, although his swallow is overall functional. Suspect the taste of the barium contributed to oral holding and gagging, although oral holding of purees was also noted at bedside. Only one instance of trace, transient penetration occurred as pt was attempting to swallow the pill with thin liquids and seemingly gagged. He exhibited great airway protection throughout this event and no further penetration/aspiration was observed throughout trials of nectar or honey thick liquids, purees, and solids. Pt required frequent breaks and had increased WOB at times. Given pt's fatigue and cognition, recommend initiating a diet of full liquids with good  potential to upgrade as mentation improves. He will require total assistance for feeding. Question pt's ability to attend throughout the course of a meal and achieve adequate oral intake at this time, therefore, he may require continued TPN until adequate intake is achieved. Will continue to follow to target diet toleration and upgrade as able. Factors that may increase risk of adverse event in presence  Lab 01/07/23 0545 01/07/23 1818 01/07/23 2207 01/08/23 0500 01/08/23 1252 01/09/23 0452 01/09/23 0856 01/09/23 1132 01/09/23 1527  WBC 10.1 13.9*  --  9.8  --  13.1*  --   --  13.5*  HGB 8.1* 7.3*   < > 6.0*   < > 6.3* 5.8* 5.8* 7.5*  HCT 25.9* 22.0*   < > 19.0*   < > 18.2* 17.7* 17.0* 22.3*  MCV 84.9 84.3  --  84.8  --  84.7  --   --  90.3  PLT 235 226  --  196  --  187  --   --  172   < > = values in this interval not displayed.   Cardiac Enzymes: No results for input(s): "CKTOTAL", "CKMB", "CKMBINDEX", "TROPONINI" in the last 168 hours. CBG: Recent Labs  Lab 01/08/23 0554 01/08/23 1131 01/09/23 0553 01/09/23 1142 01/09/23 1643  GLUCAP 120* 130* 146* 139* 169*    Studies/Results: DG Chest Port 1 View  Result Date: 01/09/2023 CLINICAL DATA:  Shortness of breath with abdominal pain for several days. History of hypertension. EXAM: PORTABLE CHEST 1 VIEW COMPARISON:  Radiographs 12/29/2022 and 12/23/2022. Chest CT 12/29/2022. FINDINGS: 1121 hours. Interval extubation and removal of the enteric tube. Right IJ central venous catheter has been removed. Left IJ central venous catheter appears unchanged, projecting to the mid SVC level. The heart size and mediastinal contours are  stable. There are persistent low lung volumes. The lungs appear clear. No pleural effusion or pneumothorax. The bones appear unchanged, without acute findings. Telemetry leads overlie the chest. IMPRESSION: Interval extubation and removal of the enteric tube and right IJ central venous catheter. No acute cardiopulmonary process. Electronically Signed   By: Carey Bullocks M.D.   On: 01/09/2023 13:23   DG Swallowing Func-Speech Pathology  Result Date: 01/08/2023 Table formatting from the original result was not included. Modified Barium Swallow Study Patient Details Name: [name] [name] MRN: [id number] Date of Birth: 03/02/1956 Today's Date: 01/08/2023 HPI/PMH: HPI: Pt is a 67 yo male presenting to ED from OP vascular surgery office with known mesenteric artery stenosis s/p SMA angiogram. Admitted 9/11 with several week history of abdominal pain with 30 lb weight loss. W/u included mesenteric ischemia and necrotic area of jejunum s/p exploratory laparotomy and small bowel resection 9/12, reopening of laparotomy 9/14, second reopening and small bowel resection with anastomosis x2 9/26. Intubated 9/12-9/22. PMH includes testicular cancer, HTN, retinal detachment, GERD, HTN, HLD Clinical Impression: Clinical Impression: Pt presents with a suspected cognitively based oral dysphagia, although his swallow is overall functional. Suspect the taste of the barium contributed to oral holding and gagging, although oral holding of purees was also noted at bedside. Only one instance of trace, transient penetration occurred as pt was attempting to swallow the pill with thin liquids and seemingly gagged. He exhibited great airway protection throughout this event and no further penetration/aspiration was observed throughout trials of nectar or honey thick liquids, purees, and solids. Pt required frequent breaks and had increased WOB at times. Given pt's fatigue and cognition, recommend initiating a diet of full liquids with good  potential to upgrade as mentation improves. He will require total assistance for feeding. Question pt's ability to attend throughout the course of a meal and achieve adequate oral intake at this time, therefore, he may require continued TPN until adequate intake is achieved. Will continue to follow to target diet toleration and upgrade as able. Factors that may increase risk of adverse event in presence  Lab 01/07/23 0545 01/07/23 1818 01/07/23 2207 01/08/23 0500 01/08/23 1252 01/09/23 0452 01/09/23 0856 01/09/23 1132 01/09/23 1527  WBC 10.1 13.9*  --  9.8  --  13.1*  --   --  13.5*  HGB 8.1* 7.3*   < > 6.0*   < > 6.3* 5.8* 5.8* 7.5*  HCT 25.9* 22.0*   < > 19.0*   < > 18.2* 17.7* 17.0* 22.3*  MCV 84.9 84.3  --  84.8  --  84.7  --   --  90.3  PLT 235 226  --  196  --  187  --   --  172   < > = values in this interval not displayed.   Cardiac Enzymes: No results for input(s): "CKTOTAL", "CKMB", "CKMBINDEX", "TROPONINI" in the last 168 hours. CBG: Recent Labs  Lab 01/08/23 0554 01/08/23 1131 01/09/23 0553 01/09/23 1142 01/09/23 1643  GLUCAP 120* 130* 146* 139* 169*    Studies/Results: DG Chest Port 1 View  Result Date: 01/09/2023 CLINICAL DATA:  Shortness of breath with abdominal pain for several days. History of hypertension. EXAM: PORTABLE CHEST 1 VIEW COMPARISON:  Radiographs 12/29/2022 and 12/23/2022. Chest CT 12/29/2022. FINDINGS: 1121 hours. Interval extubation and removal of the enteric tube. Right IJ central venous catheter has been removed. Left IJ central venous catheter appears unchanged, projecting to the mid SVC level. The heart size and mediastinal contours are  stable. There are persistent low lung volumes. The lungs appear clear. No pleural effusion or pneumothorax. The bones appear unchanged, without acute findings. Telemetry leads overlie the chest. IMPRESSION: Interval extubation and removal of the enteric tube and right IJ central venous catheter. No acute cardiopulmonary process. Electronically Signed   By: Carey Bullocks M.D.   On: 01/09/2023 13:23   DG Swallowing Func-Speech Pathology  Result Date: 01/08/2023 Table formatting from the original result was not included. Modified Barium Swallow Study Patient Details Name: [name] [name] MRN: [id number] Date of Birth: 03/02/1956 Today's Date: 01/08/2023 HPI/PMH: HPI: Pt is a 67 yo male presenting to ED from OP vascular surgery office with known mesenteric artery stenosis s/p SMA angiogram. Admitted 9/11 with several week history of abdominal pain with 30 lb weight loss. W/u included mesenteric ischemia and necrotic area of jejunum s/p exploratory laparotomy and small bowel resection 9/12, reopening of laparotomy 9/14, second reopening and small bowel resection with anastomosis x2 9/26. Intubated 9/12-9/22. PMH includes testicular cancer, HTN, retinal detachment, GERD, HTN, HLD Clinical Impression: Clinical Impression: Pt presents with a suspected cognitively based oral dysphagia, although his swallow is overall functional. Suspect the taste of the barium contributed to oral holding and gagging, although oral holding of purees was also noted at bedside. Only one instance of trace, transient penetration occurred as pt was attempting to swallow the pill with thin liquids and seemingly gagged. He exhibited great airway protection throughout this event and no further penetration/aspiration was observed throughout trials of nectar or honey thick liquids, purees, and solids. Pt required frequent breaks and had increased WOB at times. Given pt's fatigue and cognition, recommend initiating a diet of full liquids with good  potential to upgrade as mentation improves. He will require total assistance for feeding. Question pt's ability to attend throughout the course of a meal and achieve adequate oral intake at this time, therefore, he may require continued TPN until adequate intake is achieved. Will continue to follow to target diet toleration and upgrade as able. Factors that may increase risk of adverse event in presence  Patient ID: [name] [name], male   DOB: [birth year], 67 y.o.   MRN: [id number]  Subjective:  He was moved to 2 heart. He has had 2 units of blood and is about to get a third.  GI has seen and has ordered a tagged red cell scan and per RN this is for the AM.  Spoke with his wife and daughter at bedside.   Review of systems:    Denies chest pain  Denies n/v Denies overt shortness of breath   O:BP 133/77 (BP Location: Left Arm)   Pulse 86   Temp 98.3 F (36.8 C) (Oral)   Resp 20   Ht 5\' 11"  (1.803 m)   Wt 102.1 kg   SpO2 100%   BMI 31.39 kg/m   Intake/Output Summary (Last 24 hours) at 01/09/2023 1720 Last data filed at 01/09/2023 1600 Gross per 24 hour  Intake 5126.2 ml  Output 950 ml  Net 4176.2 ml   Intake/Output: I/O last 3 completed shifts: In: 2560.3 [I.V.:2178.3; Blood:332; IV Piggyback:50] Out: 4500 [Urine:4500]  Intake/Output this shift:  Total I/O In: 2565.9 [I.V.:1879.6; Blood:636.3; IV Piggyback:50] Out: -  Weight change: -1.6 kg  General adult male in bed in no acute distress   HEENT normocephalic atraumatic extraocular movements intact sclera anicteric Neck supple trachea midline Lungs clear to auscultation bilaterally normal work of breathing at rest on room air Heart S1S2 no rub Abdomen soft nontender nondistended Extremities 1+ edema  Psych normal mood and affect Neuro - awake but tired. interactive; provides limited history - his wife supplements  Access no HD access   Recent Labs  Lab 01/03/23 0315 01/04/23 0444 01/05/23 0515 01/05/23 1253 01/06/23 0620 01/06/23 1809 01/07/23 0545 01/08/23 0500 01/09/23 0452 01/09/23 1132  NA 141 147* 153* 155* 149* 147* 143 136 142 142  K 3.3* 3.1* 3.0* 3.0* 3.3* 4.0 4.3 3.8 4.4 4.1  CL 108 112* 121* 122* 120* 116* 113* 113* 117*  --   CO2 22 22 22 24  20* 17* 17* 16* 12*  --   GLUCOSE 138* 157* 135* 165* 171* 188* 174* 214* 156*  --   BUN 100* 114* 116* 116* 104* 100* 97* 102* 119*  --   CREATININE 2.77*  2.90* 2.79* 2.70* 2.44* 2.30* 2.34* 2.24* 2.77*  --   ALBUMIN 2.0* 2.0* 1.9*  --   --   --  1.8* 1.6* 1.6*  --   CALCIUM 7.9* 7.9* 8.0* 8.1* 7.8* 7.9* 7.7* 7.4* 7.6*  --   PHOS 4.5 4.2 3.9  --  3.4  --  3.4 3.7 4.6  --   AST  --  214*  --   --   --   --  155* 79*  --   --   ALT  --  221*  --   --   --   --  305* 198*  --   --    Liver Function Tests: Recent Labs  Lab 01/04/23 0444 01/05/23 0515 01/07/23 0545 01/08/23 0500 01/09/23 0452  AST 214*  --  155* 79*  --   ALT 221*  --  305* 198*  --   ALKPHOS 150*  --  128* 96  --   BILITOT 1.0  --  0.5 0.6  --   PROT 5.0*  --  4.7* 4.3*  --   ALBUMIN 2.0*   < > 1.8* 1.6* 1.6*   < > = values in this interval not displayed.   CBC: Recent Labs  Lab 01/07/23 0545 01/07/23 1818 01/07/23 2207 01/08/23 0500 01/08/23 1252 01/09/23 0452 01/09/23 0856 01/09/23 1132 01/09/23 1527  WBC 10.1 13.9*  --  9.8  --  13.1*  --   --  13.5*  HGB 8.1* 7.3*   < > 6.0*   < > 6.3* 5.8* 5.8* 7.5*  HCT 25.9* 22.0*   < > 19.0*   < > 18.2* 17.7* 17.0* 22.3*  MCV 84.9 84.3  --  84.8  --  84.7  --   --  90.3  PLT 235 226  --  196  --  187  --   --  172   < > = values in this interval not displayed.   Cardiac Enzymes: No results for input(s): "CKTOTAL", "CKMB", "CKMBINDEX", "TROPONINI" in the last 168 hours. CBG: Recent Labs  Lab 01/08/23 0554 01/08/23 1131 01/09/23 0553 01/09/23 1142 01/09/23 1643  GLUCAP 120* 130* 146* 139* 169*    Studies/Results: DG Chest Port 1 View  Result Date: 01/09/2023 CLINICAL DATA:  Shortness of breath with abdominal pain for several days. History of hypertension. EXAM: PORTABLE CHEST 1 VIEW COMPARISON:  Radiographs 12/29/2022 and 12/23/2022. Chest CT 12/29/2022. FINDINGS: 1121 hours. Interval extubation and removal of the enteric tube. Right IJ central venous catheter has been removed. Left IJ central venous catheter appears unchanged, projecting to the mid SVC level. The heart size and mediastinal contours are  stable. There are persistent low lung volumes. The lungs appear clear. No pleural effusion or pneumothorax. The bones appear unchanged, without acute findings. Telemetry leads overlie the chest. IMPRESSION: Interval extubation and removal of the enteric tube and right IJ central venous catheter. No acute cardiopulmonary process. Electronically Signed   By: Carey Bullocks M.D.   On: 01/09/2023 13:23   DG Swallowing Func-Speech Pathology  Result Date: 01/08/2023 Table formatting from the original result was not included. Modified Barium Swallow Study Patient Details Name: [name] [name] MRN: [id number] Date of Birth: 03/02/1956 Today's Date: 01/08/2023 HPI/PMH: HPI: Pt is a 67 yo male presenting to ED from OP vascular surgery office with known mesenteric artery stenosis s/p SMA angiogram. Admitted 9/11 with several week history of abdominal pain with 30 lb weight loss. W/u included mesenteric ischemia and necrotic area of jejunum s/p exploratory laparotomy and small bowel resection 9/12, reopening of laparotomy 9/14, second reopening and small bowel resection with anastomosis x2 9/26. Intubated 9/12-9/22. PMH includes testicular cancer, HTN, retinal detachment, GERD, HTN, HLD Clinical Impression: Clinical Impression: Pt presents with a suspected cognitively based oral dysphagia, although his swallow is overall functional. Suspect the taste of the barium contributed to oral holding and gagging, although oral holding of purees was also noted at bedside. Only one instance of trace, transient penetration occurred as pt was attempting to swallow the pill with thin liquids and seemingly gagged. He exhibited great airway protection throughout this event and no further penetration/aspiration was observed throughout trials of nectar or honey thick liquids, purees, and solids. Pt required frequent breaks and had increased WOB at times. Given pt's fatigue and cognition, recommend initiating a diet of full liquids with good  potential to upgrade as mentation improves. He will require total assistance for feeding. Question pt's ability to attend throughout the course of a meal and achieve adequate oral intake at this time, therefore, he may require continued TPN until adequate intake is achieved. Will continue to follow to target diet toleration and upgrade as able. Factors that may increase risk of adverse event in presence

## 2023-01-09 NOTE — Progress Notes (Signed)
Progress Note   Assessment    66 year old male with complicated past medical and gastrointestinal history including history of malignant small bowel GIST with resection and 2023, prior GI bleeds with history of duodenal ulcers and intestinal angioectasias, diverticulosis with previous hemorrhage, history of gastritis and duodenitis  --This hospitalization: SMA thrombus; sma and ima occlusion -- OR with necrotic jejunum s/p resection; SMA stent in place --Recurrent GI bleeding with ongoing transfusion need  Recommendations   GI bleeding -- unclear source; small bowel most likely with recent ischemic injury and surgical anastomosis x 2; cannot exclude PUD bleed (had duodenal ulcers before) or diverticular hemorrhage from colon.  Renal function tolerated last CTA poorly.  Discussed with surgical and critical care colleagues --Tagged red cell scan now --Hold anticoagulation and antiplatelet therapy --If continued bleeding maybe nuc study will point in direct for endoscopy (upper versus lower) --If nuc scan negative and still bleeding with pursue EGD tomorrow and colon thereafter if needed --Follow-up Hgb after transfusions today --Also getting DDAVP   Chief Complaint   Transferred to ICU Ongoing bloody BMs with clots No pain No N/V No fever  Vital signs in last 24 hours: Temp:  [97.2 F (36.2 C)-101.2 F (38.4 C)] 98.1 F (36.7 C) (10/01 1302) Pulse Rate:  [82-97] 90 (10/01 1315) Resp:  [19-31] 20 (10/01 1315) BP: (81-154)/(47-75) 102/62 (10/01 1315) SpO2:  [92 %-100 %] 100 % (10/01 1315) Weight:  [102.1 kg] 102.1 kg (10/01 0428) Last BM Date : 01/08/23  General: Alert, well-developed, in NAD Heart:  Regular rate and rhythm; no murmurs Chest: Clear to ascultation bilaterally Abdomen:  Soft, nontender and nondistended. Normal bowel sounds, without guarding, and without rebound.   Extremities:  Without edema. Neurologic:  Alert and  oriented x4; grossly normal  neurologically. Psych:  Alert and cooperative. Normal mood and affect.  Intake/Output from previous day: 09/30 0701 - 10/01 0700 In: 2560.3 [I.V.:2178.3; Blood:332; IV Piggyback:50] Out: 1300 [Urine:1300] Intake/Output this shift: Total I/O In: 1600.5 [I.V.:1279.2; Blood:321.3] Out: -   Lab Results: Recent Labs    01/07/23 1818 01/07/23 2207 01/08/23 0500 01/08/23 1252 01/09/23 0452 01/09/23 0856 01/09/23 1132  WBC 13.9*  --  9.8  --  13.1*  --   --   HGB 7.3*   < > 6.0*   < > 6.3* 5.8* 5.8*  HCT 22.0*   < > 19.0*   < > 18.2* 17.7* 17.0*  PLT 226  --  196  --  187  --   --    < > = values in this interval not displayed.   BMET Recent Labs    01/07/23 0545 01/08/23 0500 01/09/23 0452 01/09/23 1132  NA 143 136 142 142  K 4.3 3.8 4.4 4.1  CL 113* 113* 117*  --   CO2 17* 16* 12*  --   GLUCOSE 174* 214* 156*  --   BUN 97* 102* 119*  --   CREATININE 2.34* 2.24* 2.77*  --   CALCIUM 7.7* 7.4* 7.6*  --    LFT Recent Labs    01/07/23 0545 01/08/23 0500 01/09/23 0452  PROT 4.7* 4.3*  --   ALBUMIN 1.8* 1.6* 1.6*  AST 155* 79*  --   ALT 305* 198*  --   ALKPHOS 128* 96  --   BILITOT 0.5 0.6  --   BILIDIR 0.2  --   --   IBILI 0.3  --   --       LOS: 20  days   Beverley Fiedler, MD 01/09/2023, 2:39 PM See Loretha Stapler, [facility], to contact our on call provider

## 2023-01-09 NOTE — Progress Notes (Signed)
01/09/2023 Plan at this time is Get tagged scan Watch closely with AC stop and DDAVP x 1 If ongoing hemodynamically insignificant bleeding, scope tomorrow depending on what tagged scan shows If brisk bleeding affecting hemodynamics, plan for CTA and either scope vs. IR intervention  Myrla Halsted MD PCCM

## 2023-01-09 NOTE — Progress Notes (Signed)
Inpatient Rehab Admissions Coordinator:    Cir following, not yet medically stable.   Megan Salon, MS, CCC-SLP Rehab Admissions Coordinator  [phone] (celll) [phone] (office)

## 2023-01-09 NOTE — Progress Notes (Signed)
SLP Cancellation Note  Patient Details Name: [name] [name] MRN: [id number] DOB: 28-Jan-1956   Cancelled treatment:       Reason Eval/Treat Not Completed: Medical issues which prohibited therapy. Pt moved to ICU for GIB w/u, made NPO. SLP will continue to follow.    Gwynneth Aliment, M.A., CF-SLP Speech Language Pathology, Acute Rehabilitation Services  Secure Chat preferred [phone]  01/09/2023, 11:14 AM

## 2023-01-09 NOTE — Progress Notes (Signed)
PT Cancellation Note  Patient Details Name: ANTAR MILKS MRN: [id number] DOB: [birth year]   Cancelled Treatment:    Reason Eval/Treat Not Completed: (P) Medical issues which prohibited therapy (Pt with a change in status and transferred to a higher level of care. Will follow up as able.)   Johny Shock 01/09/2023, 11:17 AM

## 2023-01-09 NOTE — PMR Pre-admission (Signed)
PMR Admission Coordinator Pre-Admission Assessment  Patient: [name] [name] is an 67 y.o., male MRN: [id number] DOB: 04-May-1955 Height: 5\' 11"  (180.3 cm) Weight: 102.1 kg  Insurance Information HMO:     PPO:      PCP:      IPA:      80/20: yes     OTHER:  PRIMARY: Medicare AB      Policy#: [id number]    Subscriber:  Phone#: Verified online    Fax#:  Pre-Cert#:       Employer:  Benefits:  Phone #:      Name:  Eff. Date: Parts A  and B effective 07/10/19 Deduct: $1632      Out of Pocket Max:  None      Life Max: N/A  CIR: 100%      SNF: 100 days Outpatient: 80%     Co-Pay: 20% Home Health: 100%      Co-Pay: none DME: 80%     Co-Pay: 20% Providers: patient's choice SECONDARY: TEFL teacher Supplement      Policy#: [id number]      Phone#:   Financial Counselor:       Phone#:   The "Data Collection Information Summary" for patients in Inpatient Rehabilitation Facilities with attached "Privacy Act Statement-Health Care Records" was provided and verbally reviewed with: Patient  Emergency Contact Information Contact Information     Name Relation Home Work Mobile   Ki[place] Spouse [phone]  [phone]      Other Contacts   None on File     Current Medical History  Patient Admitting Diagnosis: Necrotic Bowel, s/p Ex/lap and  small bowel resection  History of Present Illness:Pt is a 67 y/o male with PMH of testicular CA, HTN, ankle and patellar fx's, retinal detachment  who was admitted  to [facility] ED 12/20/22. with several weeks of abdominal pain along with a 30 pound weight loss. Work up included mesenteric ischemia and necrotic area of jejunum. S/p Exploratory laparotomy and small bowel resection 9/12, reopening of the laparotomy 9/14, second re-opening of laparotomy and more small bowel resection with anastomosis x2 12/25/22. Pt. Made NPO and started on TPN. On 9/30, Rapid Response called due to large bloody BM with clots and Hypotension. Received PRBCs.  GI  reconsulted. 10/2 endoscopy and colonoscopy deep ulcerations encompassing 80% on the observed bowel with stigmata of recent bleeding. Pt. Also with AKI during admission, but nephrology feels HD no longer indicated and tunneled cath removed 01/24/23. Pt. Seen by PT/OT and they recommend CIR to assist return to PLOF.    Patient's medical record from [facility] has been reviewed by the rehabilitation admission coordinator and physician.  Past Medical History  Past Medical History:  Diagnosis Date   Ankle fracture    Cancer (HCC) 1974   testicular cancer-at age 16   Cataract    bilateral sx   GERD (gastroesophageal reflux disease)    hx of   Hx of colonic polyps    pt unsure,thinks this was done in 1982 in [place]. no reports in EPIC   Hyperlipidemia    on meds   Hypertension    on meds   Kidney stones    Patella fracture    Retinal detachment    OD   Retinal tear of right eye    Syncopal episodes 2008    Has the patient had major surgery during 100 days prior to admission? Yes  Family History   family history  includes Arrhythmia in his father; Coronary artery disease in an other family member; Hypertension in an other family member; Parkinson's disease in his mother; Sudden death in an other family member.  Current Medications  Current Facility-Administered Medications:    0.9 %  sodium chloride infusion (Manually program via Guardrails IV Fluids), , Intravenous, Once, Lorin Glass, MD   0.9 %  sodium chloride infusion (Manually program via Guardrails IV Fluids), , Intravenous, Once, John Giovanni, MD   albumin human 5 % solution 12.5 g, 12.5 g, Intravenous, PRN, Lorin Glass, MD, Stopped at 01/01/23 1020   alteplase (CATHFLO ACTIVASE) injection 2 mg, 2 mg, Intracatheter, Once PRN, Anthony Sar, MD   [COMPLETED] amiodarone (NEXTERONE) 1.8 mg/mL load via infusion 150 mg, 150 mg, Intravenous, Once, 150 mg at 01/02/23 0946 **FOLLOWED BY** [EXPIRED]  amiodarone (NEXTERONE PREMIX) 360-4.14 MG/200ML-% (1.8 mg/mL) IV infusion, 60 mg/hr, Intravenous, Continuous, Last Rate: 33.3 mL/hr at 01/02/23 1600, 60 mg/hr at 01/02/23 1600 **FOLLOWED BY** amiodarone (NEXTERONE PREMIX) 360-4.14 MG/200ML-% (1.8 mg/mL) IV infusion, 30 mg/hr, Intravenous, Continuous, Lorin Glass, MD, Last Rate: 16.67 mL/hr at 01/09/23 1300, 30 mg/hr at 01/09/23 1300   anticoagulant sodium citrate solution 5 mL, 5 mL, Intracatheter, PRN, Anthony Sar, MD, 5 mL at 01/02/23 0358   bisacodyl (DULCOLAX) suppository 10 mg, 10 mg, Rectal, Daily PRN, Lorin Glass, MD, 10 mg at 12/29/22 1243   Chlorhexidine Gluconate Cloth 2 % PADS 6 each, 6 each, Topical, Daily, Barnetta Chapel, PA-C, 6 each at 01/09/23 1124   haloperidol lactate (HALDOL) injection 2 mg, 2 mg, Intravenous, Q6H PRN, Lorin Glass, MD, 2 mg at 01/01/23 2031   heparin injection 1,000 Units, 1,000 Units, Intracatheter, PRN, Anthony Sar, MD, 2,400 Units at 12/27/22 1814   hydrALAZINE (APRESOLINE) injection 20 mg, 20 mg, Intravenous, Q4H PRN, Bowser, Grace E, NP, 20 mg at 01/06/23 1609   HYDROmorphone (DILAUDID) injection 0.5-2 mg, 0.5-2 mg, Intravenous, Q3H PRN, Lorin Glass, MD, 1 mg at 01/04/23 1535   lidocaine (PF) (XYLOCAINE) 1 % injection 5 mL, 5 mL, Intradermal, PRN, Anthony Sar, MD   lidocaine-prilocaine (EMLA) cream 1 Application, 1 Application, Topical, PRN, Anthony Sar, MD   Oral care mouth rinse, 15 mL, Mouth Rinse, PRN, Lorin Glass, MD   pantoprazole (PROTONIX) injection 40 mg, 40 mg, Intravenous, Q12H, Lorin Glass, MD, 40 mg at 01/09/23 9562   pentafluoroprop-tetrafluoroeth (GEBAUERS) aerosol 1 Application, 1 Application, Topical, PRN, Anthony Sar, MD   sodium bicarbonate 150 mEq in sterile water 1,150 mL infusion, , Intravenous, Continuous, Lorin Glass, MD, Last Rate: 100 mL/hr at 01/09/23 1313, New Bag at 01/09/23 1313   sodium chloride flush (NS) 0.9 % injection 3 mL, 3 mL, Intravenous,  Q12H, Barnetta Chapel, PA-C, 3 mL at 01/09/23 1124   TPN ADULT (ION), , Intravenous, Continuous TPN, Rexford Maus, RPH, Last Rate: 100 mL/hr at 01/09/23 1300, Infusion Verify at 01/09/23 1300   TPN ADULT (ION), , Intravenous, Continuous TPN, Alphia Moh D, RPH  Patients Current Diet:  Diet Order             Diet clear liquid Room service appropriate? Yes; Fluid consistency: Thin  Diet effective now                   Precautions / Restrictions Precautions Precautions: Fall Restrictions Weight Bearing Restrictions: No   Has the patient had 2 or more falls or a fall with injury in the past year? Yes  Prior Activity  Level    Prior Functional Level Self Care: Did the patient need help bathing, dressing, using the toilet or eating? Independent  Indoor Mobility: Did the patient need assistance with walking from room to room (with or without device)? Independent  Stairs: Did the patient need assistance with internal or external stairs (with or without device)? Independent  Functional Cognition: Did the patient need help planning regular tasks such as shopping or remembering to take medications? Independent  Patient Information Are you of Hispanic, Latino/a,or Spanish origin?: A. No, not of Hispanic, Latino/a, or Spanish origin What is your race?: A. White Do you need or want an interpreter to communicate with a doctor or health care staff?: 0. No  Patient's Response To:  Health Literacy and Transportation Is the patient able to respond to health literacy and transportation needs?: Yes Health Literacy - How often do you need to have someone help you when you read instructions, pamphlets, or other written material from your doctor or pharmacy?: Never In the past 12 months, has lack of transportation kept you from medical appointments or from getting medications?: No In the past 12 months, has lack of transportation kept you from meetings, work, or from getting things needed  for daily living?: No  Home Assistive Devices / Equipment Home Assistive Devices/Equipment: Blood pressure cuff, Grab bars in shower, Hand-held shower hose, Reacher, Scales, Stimulators (Tens unit) Home Equipment: None  Prior Device Use: Indicate devices/aids used by the patient prior to current illness, exacerbation or injury? None of the above  Current Functional Level Cognition  Arousal/Alertness: Awake/alert Overall Cognitive Status: Impaired/Different from baseline Difficult to assess due to: Impaired communication, Level of arousal Orientation Level: Oriented X4 Attention: Sustained Sustained Attention: Impaired Sustained Attention Impairment: Verbal basic, Functional basic Memory: Impaired Memory Impairment: Storage deficit, Retrieval deficit Awareness: Appears intact Problem Solving: Impaired Problem Solving Impairment: Verbal basic    Extremity Assessment (includes Sensation/Coordination)  Upper Extremity Assessment: Generalized weakness, Right hand dominant  Lower Extremity Assessment: Defer to PT evaluation    ADLs  Overall ADL's : Needs assistance/impaired Eating/Feeding: NPO Grooming: Wash/dry hands, Wash/dry face, Moderate assistance, Sitting Upper Body Bathing: Maximal assistance, Sitting Lower Body Bathing: Total assistance, Bed level Upper Body Dressing : Moderate assistance, Sitting, Maximal assistance Lower Body Dressing: Total assistance, Bed level Toilet Transfer: Moderate assistance, Stand-pivot, BSC/3in1, +2 for physical assistance Toileting- Clothing Manipulation and Hygiene: Total assistance, Sit to/from stand    Mobility  Overal bed mobility: Needs Assistance Bed Mobility: Rolling, Sidelying to Sit Rolling: Mod assist Sidelying to sit: Mod assist (limited by copious stool) General bed mobility comments: cues for technique and initiation, truncal assist for roll and LE/truncal assist up via L elbow.    Transfers  Overall transfer level: Needs  assistance Equipment used: None Transfers: Sit to/from Stand, Bed to chair/wheelchair/BSC Sit to Stand: Mod assist, +2 safety/equipment Bed to/from chair/wheelchair/BSC transfer type:: Squat pivot, Stand pivot Stand pivot transfers: Mod assist, +2 physical assistance General transfer comment: cues and both forward and boost assist for standing and stand pivot.    Ambulation / Gait / Stairs / Wheelchair Mobility  Ambulation/Gait General Gait Details: unable today    Posture / Balance Balance Overall balance assessment: Needs assistance Sitting-balance support: No upper extremity supported, Bilateral upper extremity supported, Feet supported Sitting balance-Leahy Scale: Fair Standing balance support: Bilateral upper extremity supported Standing balance-Leahy Scale: Poor Standing balance comment: reliant on external support    Special needs/care consideration Skin surgical incision and Special service needs  Previous Home Environment (from acute therapy documentation) Living Arrangements: Spouse/significant other  Lives With: Spouse Available Help at Discharge: Family, Available 24 hours/day Type of Home: House Home Layout: One level Home Access: Stairs to enter [employer] of Steps: 1 Bathroom Shower/Tub: Health visitor: Standard Bathroom Accessibility: Yes How Accessible: Accessible via walker Home Care Services: No  Discharge Living Setting Plans for Discharge Living Setting: Patient's home Type of Home at Discharge: House Discharge Home Layout: One level Discharge Home Access: Stairs to enter Entrance Stairs-Rails: None Entrance Stairs-Number of Steps: 1 Discharge Bathroom Shower/Tub: Walk-in shower Discharge Bathroom Toilet: Handicapped height Discharge Bathroom Accessibility: No Does the patient have any problems obtaining your medications?: No  Social/Family/Support Systems Patient Roles: Spouse Contact Information:  [phone] Anticipated Caregiver: Jaydus Cloke Ability/Limitations of Caregiver: Wife is home with Pt. 24/7 Caregiver Availability: 24/7 Discharge Plan Discussed with Primary Caregiver: Yes Is Caregiver In Agreement with Plan?: Yes  Goals Patient/Family Goal for Rehab: PT/OT Min A Expected length of stay: 16-18 days Pt/Family Agrees to Admission and willing to participate: Yes Program Orientation Provided & Reviewed with Pt/Caregiver Including Roles  & Responsibilities: Yes  Decrease burden of Care through IP rehab admission: Diet advancement, Decrease number of caregivers, Bowel and bladder program, and Patient/family education  Possible need for SNF placement upon discharge: not anticipated  Patient Condition: I have reviewed medical records from [facility] , spoken with CM, and patient and spouse. I met with patient at the bedside for inpatient rehabilitation assessment.  Patient will benefit from ongoing PT and OT, can actively participate in 3 hours of therapy a day 5 days of the week, and can make measurable gains during the admission.  Patient will also benefit from the coordinated team approach during an Inpatient Acute Rehabilitation admission.  The patient will receive intensive therapy as well as Rehabilitation physician, nursing, social worker, and care management interventions.  Due to bladder management, bowel management, safety, skin/wound care, disease management, medication administration, pain management, and patient education the patient requires 24 hour a day rehabilitation nursing.  The patient is currently min A with mobility and basic ADLs.  Discharge setting and therapy post discharge at home with home health is anticipated.  Patient has agreed to participate in the Acute Inpatient Rehabilitation Program and will admit today.  Preadmission Screen Completed By:  Jeronimo Greaves, 01/09/2023 3:36  PM ______________________________________________________________________   Discussed status with Dr. Carlis Abbott on 01/24/23 at 930 and received approval for admission today.  Admission Coordinator:  Jeronimo Greaves, CCC-SLP, time 1022/Date 01/24/23   Assessment/Plan: Diagnosis: Debility Does the need for close, 24 hr/day Medical supervision in concert with the patient's rehab needs make it unreasonable for this patient to be served in a less intensive setting? Yes Co-Morbidities requiring supervision/potential complications: necrotic bowel, testicular CA, HTN, ankle and patella fractures Due to bladder management, bowel management, safety, skin/wound care, disease management, medication administration, pain management, and patient education, does the patient require 24 hr/day rehab nursing? Yes Does the patient require coordinated care of a physician, rehab nurse, PT, OT to address physical and functional deficits in the context of the above medical diagnosis(es)? Yes Addressing deficits in the following areas: balance, endurance, locomotion, strength, transferring, bowel/bladder control, bathing, dressing, feeding, grooming, toileting, and psychosocial support Can the patient actively participate in an intensive therapy program of at least 3 hrs of therapy 5 days a week? Yes The potential for patient to make measurable gains while on inpatient rehab is  excellent Anticipated functional outcomes upon discharge from inpatient rehab: modified independent PT, modified independent OT, independent SLP Estimated rehab length of stay to reach the above functional goals is: 10 days Anticipated discharge destination: Home 10. Overall Rehab/Functional Prognosis: excellent   MD Signature: Sula Soda, MD

## 2023-01-09 NOTE — Consult Note (Addendum)
WOC Nurse wound follow up Right upper buttock previously noted pressure injury has progressed to a Stage 3: 3X1X.2cm, red and moist.  Pt had large amt bloody stool and it is difficult to keep the affected area from becoming soiled. Cleaned patient and changed linens. Dressing procedure/placement/frequency: Topical treatment ordered for bedside nurses to perform as follows: Apply foam dressing to right buttock. Change Q 3 days or PRN soiling. WOC team will assess weekly to determine if a change in the plan of care is indicated at that time.  Thank-you,  Cammie Mcgee MSN, RN, CWOCN, [place], CNS [phone]

## 2023-01-09 NOTE — Progress Notes (Signed)
PHARMACY - TOTAL PARENTERAL NUTRITION CONSULT NOTE  Indication:  SMA syndrome w associated weight loss  Patient Measurements: Height: 5\' 11"  (180.3 cm) Weight: 102.1 kg (225 lb 1.4 oz) IBW/kg (Calculated) : 75.3 TPN AdjBW (KG): 80.4 Body mass index is 31.39 kg/m. Usual Weight: 95 kg, has lost 50lbs recently   Assessment:  67 yo M admitted with abdominal pain, weight loss, and poor PO intake. 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 He is now s/p SMA stent placement. He was initially left is discontinuity with open abdomen and was closed on 9/16. TPN was initiated 9/14.   Pt evaluated by speech therapy 9/29 - was able to tolerate trials of ice chips, thin liquid, and puree w/o overt signs or aspiration or difficulty swallowing.  MBS completed 9/30 - cognitive based oral dysphagia; recommended for trial of full liquids and upgrade as mentation allows. Requires full assistance for feeding until mentation improves.   Glucose / Insulin: A1c 6.3% - CBGs < 150; used 6u SSI/24 hours.  Electrolytes:  Cl 117 up; CO2 down 12 (max acetate in TPN), CoCa 9.5, Phos 4.6 up. Others wnl  Renal: off CRRT 9/24, no iHD yet. SCr 2.77 up, BUN 119  Hepatic: AST down 79, ALT down 198, alk phos/tbili wnl, albumin 1.6, TG 88 (wnl) Intake / Output; MIVF: NS bolus then @ 75/hr x12 hrs per nephro; albumin x1; UOP 0.5 ml/kg/hr, LBM 9/30. RBC ~600 ml with large bloody BM with clots 9/30 GI Imaging:  9/20 CTA: improved ostial stenosis post stenting, thrombus/occlusion of SMA/ileocolic artery improving, surgical site has increased dilatation, L lung nodule GI Surgeries / Procedures:  9/14 re-ex lap, left in discontinuity with plans for re-exploration  9/16 re-ex lap, SBR with anastomosis x2, abdominal closure Wounds (aside from abd): Deep tissue pressure injury to B/L buttocks  Central access: CVC LIJ 12/23/22 TPN start date:  12/23/22  Nutritional Goals: Goal concentrated TPN rate is 80 mL/hr (provides 148 g AA and 2302 kCal per day) Goal un-concentrated TPN is 100 mL/hr (provides 140g AA and 2253 kCal per day)  RD Estimated Needs Total Energy Estimated Needs: 2300-2500 kcals Total Protein Estimated Needs: 140 -150 g Total Fluid Estimated Needs: >/= 2.3 L  Current Nutrition:  NPO and TPN   Plan:  Continue un-concentrated TPN at goal rate of 100 ml/hr to meet 100% of needs Electrolytes in TPN: Na 25 mEq/L (added back 9/30 given Na trend), decrease K 16 mEq/L, Ca 4 mEq/L, Mg 5 mEq/L, decrease Phos 0 mmol/L, Cl:Ac - max acetate.  Add standard MVI and trace elements to TPN Stop SSI and BG checks Monitor TPN labs on Mon/Thurs, daily renal function panel per MD  Additional IVF per nephrology F/u diet advancement and PO intake  Alphia Moh, PharmD, BCPS, [facility] Clinical Pharmacist  Please check AMION for all [facility] Pharm[facility] phone numbers After 10:00 PM, call Main Pharmacy [phone]

## 2023-01-10 ENCOUNTER — Inpatient Hospital Stay (HOSPITAL_COMMUNITY): Payer: Medicare Other | Admitting: Anesthesiology

## 2023-01-10 ENCOUNTER — Encounter (HOSPITAL_COMMUNITY): Payer: Self-pay | Admitting: Family Medicine

## 2023-01-10 ENCOUNTER — Inpatient Hospital Stay (HOSPITAL_COMMUNITY): Payer: Medicare Other

## 2023-01-10 ENCOUNTER — Encounter (HOSPITAL_COMMUNITY)
Admission: EM | Disposition: A | Discharge status: Rehabilitation Facility | Payer: Self-pay | Source: Home / Self Care | Attending: Internal Medicine

## 2023-01-10 ENCOUNTER — Encounter: Payer: Medicare Other | Admitting: Vascular Surgery

## 2023-01-10 DIAGNOSIS — K449 Diaphragmatic hernia without obstruction or gangrene: Secondary | ICD-10-CM

## 2023-01-10 DIAGNOSIS — K626 Ulcer of anus and rectum: Secondary | ICD-10-CM | POA: Diagnosis not present

## 2023-01-10 DIAGNOSIS — I129 Hypertensive chronic kidney disease with stage 1 through stage 4 chronic kidney disease, or unspecified chronic kidney disease: Secondary | ICD-10-CM | POA: Diagnosis not present

## 2023-01-10 DIAGNOSIS — K922 Gastrointestinal hemorrhage, unspecified: Secondary | ICD-10-CM | POA: Diagnosis not present

## 2023-01-10 DIAGNOSIS — N189 Chronic kidney disease, unspecified: Secondary | ICD-10-CM | POA: Diagnosis not present

## 2023-01-10 DIAGNOSIS — K551 Chronic vascular disorders of intestine: Secondary | ICD-10-CM | POA: Diagnosis not present

## 2023-01-10 DIAGNOSIS — K633 Ulcer of intestine: Secondary | ICD-10-CM

## 2023-01-10 HISTORY — PX: BIOPSY: SHX5522

## 2023-01-10 HISTORY — PX: ESOPHAGOGASTRODUODENOSCOPY (EGD) WITH PROPOFOL: SHX5813

## 2023-01-10 HISTORY — PX: COLONOSCOPY: SHX5424

## 2023-01-10 LAB — RENAL FUNCTION PANEL
Albumin: 1.5 g/dL — ABNORMAL LOW (ref 3.5–5.0)
Anion gap: 7 (ref 5–15)
BUN: 120 mg/dL — ABNORMAL HIGH (ref 8–23)
CO2: 18 mmol/L — ABNORMAL LOW (ref 22–32)
Calcium: 7.3 mg/dL — ABNORMAL LOW (ref 8.9–10.3)
Chloride: 114 mmol/L — ABNORMAL HIGH (ref 98–111)
Creatinine, Ser: 2.73 mg/dL — ABNORMAL HIGH (ref 0.61–1.24)
GFR, Estimated: 25 mL/min — ABNORMAL LOW (ref >60)
Glucose, Bld: 148 mg/dL — ABNORMAL HIGH (ref 70–99)
Phosphorus: 4 mg/dL (ref 2.5–4.6)
Potassium: 3.8 mmol/L (ref 3.5–5.1)
Sodium: 139 mmol/L (ref 135–145)

## 2023-01-10 LAB — CBC
HCT: 21.5 % — ABNORMAL LOW (ref 39.0–52.0)
HCT: 22.3 % — ABNORMAL LOW (ref 39.0–52.0)
HCT: 21.4 % — ABNORMAL LOW (ref 39.0–52.0)
Hemoglobin: 7.4 g/dL — ABNORMAL LOW (ref 13.0–17.0)
Hemoglobin: 7.4 g/dL — ABNORMAL LOW (ref 13.0–17.0)
Hemoglobin: 7.1 g/dL — ABNORMAL LOW (ref 13.0–17.0)
MCH: 30.7 pg (ref 26.0–34.0)
MCH: 29.6 pg (ref 26.0–34.0)
MCH: 29.8 pg (ref 26.0–34.0)
MCHC: 34.4 g/dL (ref 30.0–36.0)
MCHC: 33.2 g/dL (ref 30.0–36.0)
MCHC: 33.2 g/dL (ref 30.0–36.0)
MCV: 89.2 fL (ref 80.0–100.0)
MCV: 89.2 fL (ref 80.0–100.0)
MCV: 89.9 fL (ref 80.0–100.0)
Platelets: 158 10*3/uL (ref 150–400)
Platelets: 182 10*3/uL (ref 150–400)
Platelets: 181 10*3/uL (ref 150–400)
RBC: 2.41 MIL/uL — ABNORMAL LOW (ref 4.22–5.81)
RBC: 2.5 MIL/uL — ABNORMAL LOW (ref 4.22–5.81)
RBC: 2.38 MIL/uL — ABNORMAL LOW (ref 4.22–5.81)
RDW: 16.7 % — ABNORMAL HIGH (ref 11.5–15.5)
RDW: 17.2 % — ABNORMAL HIGH (ref 11.5–15.5)
RDW: 17.2 % — ABNORMAL HIGH (ref 11.5–15.5)
WBC: 11.5 10*3/uL — ABNORMAL HIGH (ref 4.0–10.5)
WBC: 11.1 10*3/uL — ABNORMAL HIGH (ref 4.0–10.5)
WBC: 10.3 10*3/uL (ref 4.0–10.5)
nRBC: 0 % (ref 0.0–0.2)
nRBC: 0 % (ref 0.0–0.2)
nRBC: 0 % (ref 0.0–0.2)

## 2023-01-10 LAB — MAGNESIUM: Magnesium: 1.9 mg/dL (ref 1.7–2.4)

## 2023-01-10 LAB — GLUCOSE, CAPILLARY
Glucose-Capillary: 137 mg/dL — ABNORMAL HIGH (ref 70–99)
Glucose-Capillary: 141 mg/dL — ABNORMAL HIGH (ref 70–99)
Glucose-Capillary: 115 mg/dL — ABNORMAL HIGH (ref 70–99)
Glucose-Capillary: 133 mg/dL — ABNORMAL HIGH (ref 70–99)
Glucose-Capillary: 133 mg/dL — ABNORMAL HIGH (ref 70–99)

## 2023-01-10 SURGERY — ESOPHAGOGASTRODUODENOSCOPY (EGD) WITH PROPOFOL
Anesthesia: Monitor Anesthesia Care

## 2023-01-10 SURGERY — COLONOSCOPY WITH PROPOFOL
Anesthesia: Monitor Anesthesia Care

## 2023-01-10 MED ORDER — TRACE MINERALS CU-MN-SE-ZN 300-55-60-3000 MCG/ML IV SOLN
INTRAVENOUS | Status: AC
  Filled 2023-01-10: qty 934.4

## 2023-01-10 MED ORDER — PROPOFOL 10 MG/ML IV BOLUS
INTRAVENOUS | Status: DC | PRN
  Administered 2023-01-10: 30 mg via INTRAVENOUS

## 2023-01-10 MED ORDER — PHENYLEPHRINE 80 MCG/ML (10ML) SYRINGE FOR IV PUSH (FOR BLOOD PRESSURE SUPPORT)
PREFILLED_SYRINGE | INTRAVENOUS | Status: DC | PRN
  Administered 2023-01-10: 80 ug via INTRAVENOUS

## 2023-01-10 MED ORDER — PROPOFOL 500 MG/50ML IV EMUL
INTRAVENOUS | Status: DC | PRN
  Administered 2023-01-10: 50 ug/kg/min via INTRAVENOUS

## 2023-01-10 MED ORDER — SODIUM CHLORIDE 0.9 % IV SOLN: INTRAVENOUS | Status: DC

## 2023-01-10 MED ORDER — HEPARIN SODIUM (PORCINE) 1000 UNIT/ML DIALYSIS
100.0000 [IU]/kg | INTRAMUSCULAR | Status: DC | PRN
  Filled 2023-01-10: qty 10

## 2023-01-10 SURGICAL SUPPLY — 15 items

## 2023-01-10 NOTE — Anesthesia Postprocedure Evaluation (Signed)
Anesthesia Post Note  Patient: ADD DINAPOLI  Procedure(s) Performed: ESOPHAGOGASTRODUODENOSCOPY (EGD) WITH PROPOFOL COLONOSCOPY BIOPSY     Patient location during evaluation: Endoscopy Anesthesia Type: MAC Level of consciousness: sedated, patient cooperative and oriented Pain management: pain level controlled Vital Signs Assessment: post-procedure vital signs reviewed and stable Respiratory status: nonlabored ventilation, spontaneous breathing, respiratory function stable and patient connected to nasal cannula oxygen Cardiovascular status: blood pressure returned to baseline and stable Postop Assessment: no apparent nausea or vomiting Anesthetic complications: no   No notable events documented.  Last Vitals:  Vitals:   01/10/23 1330 01/10/23 1345  BP: (!) 161/70 (!) 160/72  Pulse: 73 76  Resp: (!) 21 17  Temp: (!) 36.3 C   SpO2: 100% 100%    Last Pain:  Vitals:   01/10/23 1345  TempSrc:   PainSc: 0-No pain                 Oliveah Zwack,E. Mivaan Corbitt

## 2023-01-10 NOTE — Progress Notes (Signed)
OT Cancellation Note  Patient Details Name: DAISY MCNEEL MRN: [id number] DOB: 1955-12-15   Cancelled Treatment:    Reason Eval/Treat Not Completed: Medical issues which prohibited therapy. Pt pending endoscopy to address ongoing GIB. OT will hold re-evaluation and continue communication with medical team for readiness for therapy.   Evern Bio Ramanda Paules 01/10/2023, 8:55 AM  Nyoka Cowden OTR/L Acute Rehabilitation Services Office: [phone]

## 2023-01-10 NOTE — Progress Notes (Signed)
PT Cancellation Note  Patient Details Name: [name] [name] MRN: [id number] DOB: 08/16/1955   Cancelled Treatment:    Reason Eval/Treat Not Completed: Medical issues which prohibited therapy. Pt pending endoscopy to address ongoing GIB. PT will hold re-evaluation and continue communication with medical team for readiness for therapy.   Lewis Shock, PT, DPT Acute Rehabilitation Services Secure chat preferred Office #: [phone]    Iona Hansen 01/10/2023, 9:01 AM

## 2023-01-10 NOTE — Op Note (Signed)
[facility] Patient Name: Race Hider Procedure Date : 01/10/2023 MRN: [id number] Attending MD: Beverley Fiedler , MD, [id number] Date of Birth: [birth year] CSN: [id number] Age: 67 Admit Type: Inpatient Procedure:                Upper GI endoscopy Indications:              Hematochezia, Active gastrointestinal bleeding,                            history of recent small bowel ischemia and                            resection, history of small bowel GIST Providers:                Carie Caddy. Rhea Belton, MD, Glory Rosebush, RN, Priscella Mann,                            Technician Referring MD:             PCCM, CT surgery Medicines:                Monitored Anesthesia Care Complications:            No immediate complications. Estimated Blood Loss:     Estimated blood loss: none. Procedure:                Pre-Anesthesia Assessment:                           - Prior to the procedure, a History and Physical                            was performed, and patient medications and                            allergies were reviewed. The patient's tolerance of                            previous anesthesia was also reviewed. The risks                            and benefits of the procedure and the sedation                            options and risks were discussed with the patient.                            All questions were answered, and informed consent                            was obtained. Prior Anticoagulants: The patient has                            taken no anticoagulant or antiplatelet agents. ASA  Grade Assessment: III - A patient with severe                            systemic disease. After reviewing the risks and                            benefits, the patient was deemed in satisfactory                            condition to undergo the procedure.                           After obtaining informed consent, the endoscope was                             passed under direct vision. Throughout the                            procedure, the patient's blood pressure, pulse, and                            oxygen saturations were monitored continuously. The                            [id number]-H190 ([id number]) Olympus endoscope was introduced                            through the mouth, and advanced to the second part                            of duodenum. The upper GI endoscopy was                            accomplished without difficulty. The patient                            tolerated the procedure well. Scope In: Scope Out: Findings:      The examined esophagus was normal.      A 2 cm hiatal hernia was present.      The gastroesophageal flap valve was visualized endoscopically and       classified as Hill Grade III (minimal fold, loose to endoscope, hiatal       hernia likely).      Patchy mildly erythematous mucosa without bleeding was found in the       gastric body.      A mild deformity was found in the duodenal sweep likely related to       previous surgery/treatment (navigated after changing to ultraslim       colonoscope). Mucosa normal without inflammation or ulceration. No       bleeding source seen and no blood in the examined duodenum. Impression:               - Normal esophagus.                           -  2 cm hiatal hernia.                           - Erythematous mucosa in the gastric body. Not                            bleeding. No blood in the stomach.                           - Duodenal angulation at D1/D2 junction. Mucosa                            normal, no ulcers or bleeding source.                           - No specimens collected. Moderate Sedation:      N/A Recommendation:           - Return patient to hospital ward for ongoing care.                           - Advance diet as tolerated.                           - Continue present medications.                           - See the other procedure note for  documentation of                            additional recommendations. Procedure Code(s):        --- Professional ---                           [phone], Esophagogastroduodenoscopy, flexible,                            transoral; diagnostic, including collection of                            specimen(s) by brushing or washing, when performed                            (separate procedure) Diagnosis Code(s):        --- Professional ---                           K44.9, Diaphragmatic hernia without obstruction or                            gangrene                           K31.89, Other diseases of stomach and duodenum                           K92.1, Melena (includes Hematochezia)  K92.2, Gastrointestinal hemorrhage, unspecified CPT copyright 2022 [employer]. All rights reserved. The codes documented in this report are preliminary and upon coder review may  be revised to meet current compliance requirements. Beverley Fiedler, MD 01/10/2023 1:13:13 PM This report has been signed electronically. Number of Addenda: 0

## 2023-01-10 NOTE — Progress Notes (Signed)
1.8* 1.6* 1.6* 1.5*   < > = values in this interval not displayed.   CBC: Recent Labs  Lab 01/08/23 0500 01/08/23 1252 01/09/23 0452 01/09/23 0856 01/09/23 1527 01/09/23 2116 01/10/23 0333  WBC 9.8  --  13.1*  --  13.5* 14.4* 11.5*  HGB 6.0*   < > 6.3*   < > 7.5* 8.2* 7.4*  HCT 19.0*   < > 18.2*   < > 22.3* 24.0* 21.5*  MCV 84.8  --  84.7  --  90.3 88.6 89.2  PLT 196  --  187  --  172 161 158   < > = values in this interval not displayed.   Cardiac Enzymes: No results for input(s): "CKTOTAL", "CKMB", "CKMBINDEX", "TROPONINI" in the last 168 hours. CBG: Recent Labs  Lab 01/09/23 1142 01/09/23 1643 01/09/23 1958 01/09/23 2354 01/10/23 0337  GLUCAP 139* 169* 155* 137* 141*    Studies/Results: DG Chest Port 1 View  Result Date: 01/09/2023 CLINICAL DATA:  Shortness of breath with abdominal pain for several days. History of hypertension. EXAM: PORTABLE CHEST 1 VIEW COMPARISON:  Radiographs 12/29/2022 and 12/23/2022. Chest CT 12/29/2022. FINDINGS: 1121 hours. Interval extubation and removal of the enteric tube. Right IJ central venous catheter has been removed. Left IJ central venous catheter appears unchanged, projecting to the mid SVC level. The heart size  and mediastinal contours are stable. There are persistent low lung volumes. The lungs appear clear. No pleural effusion or pneumothorax. The bones appear unchanged, without acute findings. Telemetry leads overlie the chest. IMPRESSION: Interval extubation and removal of the enteric tube and right IJ central venous catheter. No acute cardiopulmonary process. Electronically Signed   By: [name] [name] M.D.   On: 01/09/2023 13:23   DG Swallowing Func-Speech Pathology  Result Date: 01/08/2023 Table formatting from the original result was not included. Modified Barium Swallow Study Patient Details Name: [name] [name] MRN: [id number] Date of Birth: [birth year] Today's Date: 01/08/2023 HPI/PMH: HPI: Pt is a 67 yo male presenting to ED from OP vascular surgery office with known mesenteric artery stenosis s/p SMA angiogram. Admitted 9/11 with several week history of abdominal pain with 30 lb weight loss. W/u included mesenteric ischemia and necrotic area of jejunum s/p exploratory laparotomy and small bowel resection 9/12, reopening of laparotomy 9/14, second reopening and small bowel resection with anastomosis x2 9/26. Intubated 9/12-9/22. PMH includes testicular cancer, HTN, retinal detachment, GERD, HTN, HLD Clinical Impression: Clinical Impression: Pt presents with a suspected cognitively based oral dysphagia, although his swallow is overall functional. Suspect the taste of the barium contributed to oral holding and gagging, although oral holding of purees was also noted at bedside. Only one instance of trace, transient penetration occurred as pt was attempting to swallow the pill with thin liquids and seemingly gagged. He exhibited great airway protection throughout this event and no further penetration/aspiration was observed throughout trials of nectar or honey thick liquids, purees, and solids. Pt required frequent breaks and had increased WOB at times. Given pt's fatigue and cognition, recommend initiating a diet  of full liquids with good potential to upgrade as mentation improves. He will require total assistance for feeding. Question pt's ability to attend throughout the course of a meal and achieve adequate oral intake at this time, therefore, he may require continued TPN until adequate intake is achieved. Will continue to follow to target diet toleration and upgrade as able. Factors that may increase risk of adverse event in presence of aspiration [name] [name] & [name] [name]  Patient ID: [name] [name], male   DOB: 03-08-1956, 67 y.o.   MRN: [id number]  Subjective:  Multiple units of PRBC's yesterday. He has a tagged red cell scan planned for this AM.  He had 930 mL UOP over 10/1 as well as one unmeasured urine void.  He got bicarb gtt overnight.  No additional units overnight.  He confirms that he would want dialysis/RRT again if needed.    Review of systems:    Denies chest pain  Denies n/v; some diarrhea Denies overt shortness of breath   O:BP (!) 154/68   Pulse 80   Temp 99.8 F (37.7 C) (Axillary)   Resp 17   Ht 5\' 11"  (1.803 m)   Wt 103.1 kg   SpO2 98%   BMI 31.70 kg/m   Intake/Output Summary (Last 24 hours) at 01/10/2023 8657 Last data filed at 01/10/2023 0500 Gross per 24 hour  Intake 5105.79 ml  Output 930 ml  Net 4175.79 ml   Intake/Output: I/O last 3 completed shifts: In: 5773.6 [I.V.:4705.3; Blood:968.3; IV Piggyback:100] Out: 1380 [Urine:1380]  Intake/Output this shift:  Total I/O In: 1892.5 [I.V.:1540.5; Blood:352] Out: 850 [Urine:850] Weight change: 1 kg  General adult male in bed in no acute distress    HEENT normocephalic atraumatic extraocular movements intact sclera anicteric Neck supple trachea midline Lungs clear to auscultation bilaterally normal work of breathing at rest on room air Heart S1S2 no rub Abdomen soft nontender nondistended Extremities 1-2+ edema  Psych normal mood and affect Neuro - awake but tired. interactive; provides limited history - his wife supplements  Access no HD access   Recent Labs  Lab 01/04/23 0444 01/05/23 0515 01/05/23 1253 01/06/23 0620 01/06/23 1809 01/07/23 0545 01/08/23 0500 01/09/23 0452 01/09/23 1132 01/10/23 0333  NA 147* 153* 155* 149* 147* 143 136 142 142 139  K 3.1* 3.0* 3.0* 3.3* 4.0 4.3 3.8 4.4 4.1 3.8  CL 112* 121* 122* 120* 116* 113* 113* 117*  --  114*  CO2 22 22 24  20* 17* 17* 16* 12*  --  18*  GLUCOSE 157* 135* 165* 171* 188* 174* 214* 156*  --  148*  BUN  114* 116* 116* 104* 100* 97* 102* 119*  --  120*  CREATININE 2.90* 2.79* 2.70* 2.44* 2.30* 2.34* 2.24* 2.77*  --  2.73*  ALBUMIN 2.0* 1.9*  --   --   --  1.8* 1.6* 1.6*  --  1.5*  CALCIUM 7.9* 8.0* 8.1* 7.8* 7.9* 7.7* 7.4* 7.6*  --  7.3*  PHOS 4.2 3.9  --  3.4  --  3.4 3.7 4.6  --  4.0  AST 214*  --   --   --   --  155* 79*  --   --   --   ALT 221*  --   --   --   --  305* 198*  --   --   --    Liver Function Tests: Recent Labs  Lab 01/04/23 0444 01/05/23 0515 01/07/23 0545 01/08/23 0500 01/09/23 0452 01/10/23 0333  AST 214*  --  155* 79*  --   --   ALT 221*  --  305* 198*  --   --   ALKPHOS 150*  --  128* 96  --   --   BILITOT 1.0  --  0.5 0.6  --   --   PROT 5.0*  --  4.7* 4.3*  --   --   ALBUMIN 2.0*   < >  1.8* 1.6* 1.6* 1.5*   < > = values in this interval not displayed.   CBC: Recent Labs  Lab 01/08/23 0500 01/08/23 1252 01/09/23 0452 01/09/23 0856 01/09/23 1527 01/09/23 2116 01/10/23 0333  WBC 9.8  --  13.1*  --  13.5* 14.4* 11.5*  HGB 6.0*   < > 6.3*   < > 7.5* 8.2* 7.4*  HCT 19.0*   < > 18.2*   < > 22.3* 24.0* 21.5*  MCV 84.8  --  84.7  --  90.3 88.6 89.2  PLT 196  --  187  --  172 161 158   < > = values in this interval not displayed.   Cardiac Enzymes: No results for input(s): "CKTOTAL", "CKMB", "CKMBINDEX", "TROPONINI" in the last 168 hours. CBG: Recent Labs  Lab 01/09/23 1142 01/09/23 1643 01/09/23 1958 01/09/23 2354 01/10/23 0337  GLUCAP 139* 169* 155* 137* 141*    Studies/Results: DG Chest Port 1 View  Result Date: 01/09/2023 CLINICAL DATA:  Shortness of breath with abdominal pain for several days. History of hypertension. EXAM: PORTABLE CHEST 1 VIEW COMPARISON:  Radiographs 12/29/2022 and 12/23/2022. Chest CT 12/29/2022. FINDINGS: 1121 hours. Interval extubation and removal of the enteric tube. Right IJ central venous catheter has been removed. Left IJ central venous catheter appears unchanged, projecting to the mid SVC level. The heart size  and mediastinal contours are stable. There are persistent low lung volumes. The lungs appear clear. No pleural effusion or pneumothorax. The bones appear unchanged, without acute findings. Telemetry leads overlie the chest. IMPRESSION: Interval extubation and removal of the enteric tube and right IJ central venous catheter. No acute cardiopulmonary process. Electronically Signed   By: [name] [name] M.D.   On: 01/09/2023 13:23   DG Swallowing Func-Speech Pathology  Result Date: 01/08/2023 Table formatting from the original result was not included. Modified Barium Swallow Study Patient Details Name: [name] [name] MRN: [id number] Date of Birth: [birth year] Today's Date: 01/08/2023 HPI/PMH: HPI: Pt is a 67 yo male presenting to ED from OP vascular surgery office with known mesenteric artery stenosis s/p SMA angiogram. Admitted 9/11 with several week history of abdominal pain with 30 lb weight loss. W/u included mesenteric ischemia and necrotic area of jejunum s/p exploratory laparotomy and small bowel resection 9/12, reopening of laparotomy 9/14, second reopening and small bowel resection with anastomosis x2 9/26. Intubated 9/12-9/22. PMH includes testicular cancer, HTN, retinal detachment, GERD, HTN, HLD Clinical Impression: Clinical Impression: Pt presents with a suspected cognitively based oral dysphagia, although his swallow is overall functional. Suspect the taste of the barium contributed to oral holding and gagging, although oral holding of purees was also noted at bedside. Only one instance of trace, transient penetration occurred as pt was attempting to swallow the pill with thin liquids and seemingly gagged. He exhibited great airway protection throughout this event and no further penetration/aspiration was observed throughout trials of nectar or honey thick liquids, purees, and solids. Pt required frequent breaks and had increased WOB at times. Given pt's fatigue and cognition, recommend initiating a diet  of full liquids with good potential to upgrade as mentation improves. He will require total assistance for feeding. Question pt's ability to attend throughout the course of a meal and achieve adequate oral intake at this time, therefore, he may require continued TPN until adequate intake is achieved. Will continue to follow to target diet toleration and upgrade as able. Factors that may increase risk of adverse event in presence of aspiration [name] [name] & [name] [name]  1.8* 1.6* 1.6* 1.5*   < > = values in this interval not displayed.   CBC: Recent Labs  Lab 01/08/23 0500 01/08/23 1252 01/09/23 0452 01/09/23 0856 01/09/23 1527 01/09/23 2116 01/10/23 0333  WBC 9.8  --  13.1*  --  13.5* 14.4* 11.5*  HGB 6.0*   < > 6.3*   < > 7.5* 8.2* 7.4*  HCT 19.0*   < > 18.2*   < > 22.3* 24.0* 21.5*  MCV 84.8  --  84.7  --  90.3 88.6 89.2  PLT 196  --  187  --  172 161 158   < > = values in this interval not displayed.   Cardiac Enzymes: No results for input(s): "CKTOTAL", "CKMB", "CKMBINDEX", "TROPONINI" in the last 168 hours. CBG: Recent Labs  Lab 01/09/23 1142 01/09/23 1643 01/09/23 1958 01/09/23 2354 01/10/23 0337  GLUCAP 139* 169* 155* 137* 141*    Studies/Results: DG Chest Port 1 View  Result Date: 01/09/2023 CLINICAL DATA:  Shortness of breath with abdominal pain for several days. History of hypertension. EXAM: PORTABLE CHEST 1 VIEW COMPARISON:  Radiographs 12/29/2022 and 12/23/2022. Chest CT 12/29/2022. FINDINGS: 1121 hours. Interval extubation and removal of the enteric tube. Right IJ central venous catheter has been removed. Left IJ central venous catheter appears unchanged, projecting to the mid SVC level. The heart size  and mediastinal contours are stable. There are persistent low lung volumes. The lungs appear clear. No pleural effusion or pneumothorax. The bones appear unchanged, without acute findings. Telemetry leads overlie the chest. IMPRESSION: Interval extubation and removal of the enteric tube and right IJ central venous catheter. No acute cardiopulmonary process. Electronically Signed   By: [name] [name] M.D.   On: 01/09/2023 13:23   DG Swallowing Func-Speech Pathology  Result Date: 01/08/2023 Table formatting from the original result was not included. Modified Barium Swallow Study Patient Details Name: [name] [name] MRN: [id number] Date of Birth: [birth year] Today's Date: 01/08/2023 HPI/PMH: HPI: Pt is a 67 yo male presenting to ED from OP vascular surgery office with known mesenteric artery stenosis s/p SMA angiogram. Admitted 9/11 with several week history of abdominal pain with 30 lb weight loss. W/u included mesenteric ischemia and necrotic area of jejunum s/p exploratory laparotomy and small bowel resection 9/12, reopening of laparotomy 9/14, second reopening and small bowel resection with anastomosis x2 9/26. Intubated 9/12-9/22. PMH includes testicular cancer, HTN, retinal detachment, GERD, HTN, HLD Clinical Impression: Clinical Impression: Pt presents with a suspected cognitively based oral dysphagia, although his swallow is overall functional. Suspect the taste of the barium contributed to oral holding and gagging, although oral holding of purees was also noted at bedside. Only one instance of trace, transient penetration occurred as pt was attempting to swallow the pill with thin liquids and seemingly gagged. He exhibited great airway protection throughout this event and no further penetration/aspiration was observed throughout trials of nectar or honey thick liquids, purees, and solids. Pt required frequent breaks and had increased WOB at times. Given pt's fatigue and cognition, recommend initiating a diet  of full liquids with good potential to upgrade as mentation improves. He will require total assistance for feeding. Question pt's ability to attend throughout the course of a meal and achieve adequate oral intake at this time, therefore, he may require continued TPN until adequate intake is achieved. Will continue to follow to target diet toleration and upgrade as able. Factors that may increase risk of adverse event in presence of aspiration [name] [name] & [name] [name]  Patient ID: [name] [name], male   DOB: 03-08-1956, 67 y.o.   MRN: [id number]  Subjective:  Multiple units of PRBC's yesterday. He has a tagged red cell scan planned for this AM.  He had 930 mL UOP over 10/1 as well as one unmeasured urine void.  He got bicarb gtt overnight.  No additional units overnight.  He confirms that he would want dialysis/RRT again if needed.    Review of systems:    Denies chest pain  Denies n/v; some diarrhea Denies overt shortness of breath   O:BP (!) 154/68   Pulse 80   Temp 99.8 F (37.7 C) (Axillary)   Resp 17   Ht 5\' 11"  (1.803 m)   Wt 103.1 kg   SpO2 98%   BMI 31.70 kg/m   Intake/Output Summary (Last 24 hours) at 01/10/2023 8657 Last data filed at 01/10/2023 0500 Gross per 24 hour  Intake 5105.79 ml  Output 930 ml  Net 4175.79 ml   Intake/Output: I/O last 3 completed shifts: In: 5773.6 [I.V.:4705.3; Blood:968.3; IV Piggyback:100] Out: 1380 [Urine:1380]  Intake/Output this shift:  Total I/O In: 1892.5 [I.V.:1540.5; Blood:352] Out: 850 [Urine:850] Weight change: 1 kg  General adult male in bed in no acute distress    HEENT normocephalic atraumatic extraocular movements intact sclera anicteric Neck supple trachea midline Lungs clear to auscultation bilaterally normal work of breathing at rest on room air Heart S1S2 no rub Abdomen soft nontender nondistended Extremities 1-2+ edema  Psych normal mood and affect Neuro - awake but tired. interactive; provides limited history - his wife supplements  Access no HD access   Recent Labs  Lab 01/04/23 0444 01/05/23 0515 01/05/23 1253 01/06/23 0620 01/06/23 1809 01/07/23 0545 01/08/23 0500 01/09/23 0452 01/09/23 1132 01/10/23 0333  NA 147* 153* 155* 149* 147* 143 136 142 142 139  K 3.1* 3.0* 3.0* 3.3* 4.0 4.3 3.8 4.4 4.1 3.8  CL 112* 121* 122* 120* 116* 113* 113* 117*  --  114*  CO2 22 22 24  20* 17* 17* 16* 12*  --  18*  GLUCOSE 157* 135* 165* 171* 188* 174* 214* 156*  --  148*  BUN  114* 116* 116* 104* 100* 97* 102* 119*  --  120*  CREATININE 2.90* 2.79* 2.70* 2.44* 2.30* 2.34* 2.24* 2.77*  --  2.73*  ALBUMIN 2.0* 1.9*  --   --   --  1.8* 1.6* 1.6*  --  1.5*  CALCIUM 7.9* 8.0* 8.1* 7.8* 7.9* 7.7* 7.4* 7.6*  --  7.3*  PHOS 4.2 3.9  --  3.4  --  3.4 3.7 4.6  --  4.0  AST 214*  --   --   --   --  155* 79*  --   --   --   ALT 221*  --   --   --   --  305* 198*  --   --   --    Liver Function Tests: Recent Labs  Lab 01/04/23 0444 01/05/23 0515 01/07/23 0545 01/08/23 0500 01/09/23 0452 01/10/23 0333  AST 214*  --  155* 79*  --   --   ALT 221*  --  305* 198*  --   --   ALKPHOS 150*  --  128* 96  --   --   BILITOT 1.0  --  0.5 0.6  --   --   PROT 5.0*  --  4.7* 4.3*  --   --   ALBUMIN 2.0*   < >  1.8* 1.6* 1.6* 1.5*   < > = values in this interval not displayed.   CBC: Recent Labs  Lab 01/08/23 0500 01/08/23 1252 01/09/23 0452 01/09/23 0856 01/09/23 1527 01/09/23 2116 01/10/23 0333  WBC 9.8  --  13.1*  --  13.5* 14.4* 11.5*  HGB 6.0*   < > 6.3*   < > 7.5* 8.2* 7.4*  HCT 19.0*   < > 18.2*   < > 22.3* 24.0* 21.5*  MCV 84.8  --  84.7  --  90.3 88.6 89.2  PLT 196  --  187  --  172 161 158   < > = values in this interval not displayed.   Cardiac Enzymes: No results for input(s): "CKTOTAL", "CKMB", "CKMBINDEX", "TROPONINI" in the last 168 hours. CBG: Recent Labs  Lab 01/09/23 1142 01/09/23 1643 01/09/23 1958 01/09/23 2354 01/10/23 0337  GLUCAP 139* 169* 155* 137* 141*    Studies/Results: DG Chest Port 1 View  Result Date: 01/09/2023 CLINICAL DATA:  Shortness of breath with abdominal pain for several days. History of hypertension. EXAM: PORTABLE CHEST 1 VIEW COMPARISON:  Radiographs 12/29/2022 and 12/23/2022. Chest CT 12/29/2022. FINDINGS: 1121 hours. Interval extubation and removal of the enteric tube. Right IJ central venous catheter has been removed. Left IJ central venous catheter appears unchanged, projecting to the mid SVC level. The heart size  and mediastinal contours are stable. There are persistent low lung volumes. The lungs appear clear. No pleural effusion or pneumothorax. The bones appear unchanged, without acute findings. Telemetry leads overlie the chest. IMPRESSION: Interval extubation and removal of the enteric tube and right IJ central venous catheter. No acute cardiopulmonary process. Electronically Signed   By: [name] [name] M.D.   On: 01/09/2023 13:23   DG Swallowing Func-Speech Pathology  Result Date: 01/08/2023 Table formatting from the original result was not included. Modified Barium Swallow Study Patient Details Name: [name] [name] MRN: [id number] Date of Birth: [birth year] Today's Date: 01/08/2023 HPI/PMH: HPI: Pt is a 67 yo male presenting to ED from OP vascular surgery office with known mesenteric artery stenosis s/p SMA angiogram. Admitted 9/11 with several week history of abdominal pain with 30 lb weight loss. W/u included mesenteric ischemia and necrotic area of jejunum s/p exploratory laparotomy and small bowel resection 9/12, reopening of laparotomy 9/14, second reopening and small bowel resection with anastomosis x2 9/26. Intubated 9/12-9/22. PMH includes testicular cancer, HTN, retinal detachment, GERD, HTN, HLD Clinical Impression: Clinical Impression: Pt presents with a suspected cognitively based oral dysphagia, although his swallow is overall functional. Suspect the taste of the barium contributed to oral holding and gagging, although oral holding of purees was also noted at bedside. Only one instance of trace, transient penetration occurred as pt was attempting to swallow the pill with thin liquids and seemingly gagged. He exhibited great airway protection throughout this event and no further penetration/aspiration was observed throughout trials of nectar or honey thick liquids, purees, and solids. Pt required frequent breaks and had increased WOB at times. Given pt's fatigue and cognition, recommend initiating a diet  of full liquids with good potential to upgrade as mentation improves. He will require total assistance for feeding. Question pt's ability to attend throughout the course of a meal and achieve adequate oral intake at this time, therefore, he may require continued TPN until adequate intake is achieved. Will continue to follow to target diet toleration and upgrade as able. Factors that may increase risk of adverse event in presence of aspiration [name] [name] & [name] [name]

## 2023-01-10 NOTE — Transfer of Care (Signed)
Immediate Anesthesia Transfer of Care Note  Patient: [name] [name]  Procedure(s) Performed: ESOPHAGOGASTRODUODENOSCOPY (EGD) WITH PROPOFOL COLONOSCOPY BIOPSY  Patient Location: PACU  Anesthesia Type:MAC  Level of Consciousness: drowsy and responds to stimulation  Airway & Oxygen Therapy: Patient Spontanous Breathing and Patient connected to nasal cannula oxygen  Post-op Assessment: Report given to RN, Post -op Vital signs reviewed and stable, and Patient moving all extremities  Post vital signs: Reviewed and stable  Last Vitals:  Vitals Value Taken Time  BP 161/70 01/10/23 1332  Temp    Pulse 69 01/10/23 1335  Resp 17 01/10/23 1335  SpO2 99 % 01/10/23 1335  Vitals shown include unfiled device data.  Last Pain:  Vitals:   01/10/23 1119  TempSrc: Temporal  PainSc: Asleep      Patients Stated Pain Goal: 2 (01/10/23 0912)  Complications: No notable events documented.

## 2023-01-10 NOTE — Progress Notes (Signed)
Patient ID: KERRIGAN GLENDENING, male   DOB: 04/12/1955, 67 y.o.   MRN: [id number] [facility] Progress Note  16 Days Post-Op  Subjective: CC-  Ongoing bright red bloody Bms, most recently about 10-15 minutes ago.  S/p 3u PRBCs yesterday. Hgb 7.4 this morning from 8.2<<7.5<<5.8 yesterday. VSS currently. Scheduled for EGD today.  Objective: Vital signs in last 24 hours: Temp:  [98 F (36.7 C)-99.8 F (37.7 C)] 98 F (36.7 C) (10/02 0755) Pulse Rate:  [76-97] 90 (10/02 0900) Resp:  [13-26] 13 (10/02 0900) BP: (98-172)/(52-81) 122/64 (10/02 0900) SpO2:  [96 %-100 %] 97 % (10/02 0900) Weight:  [103.1 kg] 103.1 kg (10/02 0459) Last BM Date : 01/09/23  Intake/Output from previous day: 10/01 0701 - 10/02 0700 In: 5339 [I.V.:4300.7; Blood:988.3; IV Piggyback:50] Out: 930 [Urine:930] Intake/Output this shift: Total I/O In: 116.7 [I.V.:116.7] Out: -   PE: Gen: alert, NAD Pulm: respirations unlabored  Abd: mildly distended but soft, no TTP, cdi dressing to midline incision  Lab Results:  Recent Labs    01/09/23 2116 01/10/23 0333  WBC 14.4* 11.5*  HGB 8.2* 7.4*  HCT 24.0* 21.5*  PLT 161 158   BMET Recent Labs    01/09/23 0452 01/09/23 1132 01/10/23 0333  NA 142 142 139  K 4.4 4.1 3.8  CL 117*  --  114*  CO2 12*  --  18*  GLUCOSE 156*  --  148*  BUN 119*  --  120*  CREATININE 2.77*  --  2.73*  CALCIUM 7.6*  --  7.3*   PT/INR No results for input(s): "LABPROT", "INR" in the last 72 hours. CMP     Component Value Date/Time   NA 139 01/10/2023 0333   K 3.8 01/10/2023 0333   CL 114 (H) 01/10/2023 0333   CO2 18 (L) 01/10/2023 0333   GLUCOSE 148 (H) 01/10/2023 0333   BUN 120 (H) 01/10/2023 0333   CREATININE 2.73 (H) 01/10/2023 0333   CALCIUM 7.3 (L) 01/10/2023 0333   PROT 4.3 (L) 01/08/2023 0500   PROT 6.3 03/16/2020 0800   ALBUMIN 1.5 (L) 01/10/2023 0333   ALBUMIN 4.3 03/16/2020 0800   AST 79 (H) 01/08/2023 0500   ALT 198 (H) 01/08/2023 0500    ALKPHOS 96 01/08/2023 0500   BILITOT 0.6 01/08/2023 0500   BILITOT 0.8 03/16/2020 0800   GFRNONAA 25 (L) 01/10/2023 0333   GFRAA >60 04/14/2015 1127   Lipase     Component Value Date/Time   LIPASE 28 11/27/2022 0755       Studies/Results: DG Chest Port 1 View  Result Date: 01/09/2023 CLINICAL DATA:  Shortness of breath with abdominal pain for several days. History of hypertension. EXAM: PORTABLE CHEST 1 VIEW COMPARISON:  Radiographs 12/29/2022 and 12/23/2022. Chest CT 12/29/2022. FINDINGS: 1121 hours. Interval extubation and removal of the enteric tube. Right IJ central venous catheter has been removed. Left IJ central venous catheter appears unchanged, projecting to the mid SVC level. The heart size and mediastinal contours are stable. There are persistent low lung volumes. The lungs appear clear. No pleural effusion or pneumothorax. The bones appear unchanged, without acute findings. Telemetry leads overlie the chest. IMPRESSION: Interval extubation and removal of the enteric tube and right IJ central venous catheter. No acute cardiopulmonary process. Electronically Signed   By: Carey Bullocks M.D.   On: 01/09/2023 13:23   DG Swallowing Func-Speech Pathology  Result Date: 01/08/2023 Table formatting from the original result was not included. Modified Barium Swallow Study Patient  Patient ID: KERRIGAN GLENDENING, male   DOB: 04/12/1955, 67 y.o.   MRN: [id number] [facility] Progress Note  16 Days Post-Op  Subjective: CC-  Ongoing bright red bloody Bms, most recently about 10-15 minutes ago.  S/p 3u PRBCs yesterday. Hgb 7.4 this morning from 8.2<<7.5<<5.8 yesterday. VSS currently. Scheduled for EGD today.  Objective: Vital signs in last 24 hours: Temp:  [98 F (36.7 C)-99.8 F (37.7 C)] 98 F (36.7 C) (10/02 0755) Pulse Rate:  [76-97] 90 (10/02 0900) Resp:  [13-26] 13 (10/02 0900) BP: (98-172)/(52-81) 122/64 (10/02 0900) SpO2:  [96 %-100 %] 97 % (10/02 0900) Weight:  [103.1 kg] 103.1 kg (10/02 0459) Last BM Date : 01/09/23  Intake/Output from previous day: 10/01 0701 - 10/02 0700 In: 5339 [I.V.:4300.7; Blood:988.3; IV Piggyback:50] Out: 930 [Urine:930] Intake/Output this shift: Total I/O In: 116.7 [I.V.:116.7] Out: -   PE: Gen: alert, NAD Pulm: respirations unlabored  Abd: mildly distended but soft, no TTP, cdi dressing to midline incision  Lab Results:  Recent Labs    01/09/23 2116 01/10/23 0333  WBC 14.4* 11.5*  HGB 8.2* 7.4*  HCT 24.0* 21.5*  PLT 161 158   BMET Recent Labs    01/09/23 0452 01/09/23 1132 01/10/23 0333  NA 142 142 139  K 4.4 4.1 3.8  CL 117*  --  114*  CO2 12*  --  18*  GLUCOSE 156*  --  148*  BUN 119*  --  120*  CREATININE 2.77*  --  2.73*  CALCIUM 7.6*  --  7.3*   PT/INR No results for input(s): "LABPROT", "INR" in the last 72 hours. CMP     Component Value Date/Time   NA 139 01/10/2023 0333   K 3.8 01/10/2023 0333   CL 114 (H) 01/10/2023 0333   CO2 18 (L) 01/10/2023 0333   GLUCOSE 148 (H) 01/10/2023 0333   BUN 120 (H) 01/10/2023 0333   CREATININE 2.73 (H) 01/10/2023 0333   CALCIUM 7.3 (L) 01/10/2023 0333   PROT 4.3 (L) 01/08/2023 0500   PROT 6.3 03/16/2020 0800   ALBUMIN 1.5 (L) 01/10/2023 0333   ALBUMIN 4.3 03/16/2020 0800   AST 79 (H) 01/08/2023 0500   ALT 198 (H) 01/08/2023 0500    ALKPHOS 96 01/08/2023 0500   BILITOT 0.6 01/08/2023 0500   BILITOT 0.8 03/16/2020 0800   GFRNONAA 25 (L) 01/10/2023 0333   GFRAA >60 04/14/2015 1127   Lipase     Component Value Date/Time   LIPASE 28 11/27/2022 0755       Studies/Results: DG Chest Port 1 View  Result Date: 01/09/2023 CLINICAL DATA:  Shortness of breath with abdominal pain for several days. History of hypertension. EXAM: PORTABLE CHEST 1 VIEW COMPARISON:  Radiographs 12/29/2022 and 12/23/2022. Chest CT 12/29/2022. FINDINGS: 1121 hours. Interval extubation and removal of the enteric tube. Right IJ central venous catheter has been removed. Left IJ central venous catheter appears unchanged, projecting to the mid SVC level. The heart size and mediastinal contours are stable. There are persistent low lung volumes. The lungs appear clear. No pleural effusion or pneumothorax. The bones appear unchanged, without acute findings. Telemetry leads overlie the chest. IMPRESSION: Interval extubation and removal of the enteric tube and right IJ central venous catheter. No acute cardiopulmonary process. Electronically Signed   By: Carey Bullocks M.D.   On: 01/09/2023 13:23   DG Swallowing Func-Speech Pathology  Result Date: 01/08/2023 Table formatting from the original result was not included. Modified Barium Swallow Study Patient  Patient ID: KERRIGAN GLENDENING, male   DOB: 04/12/1955, 67 y.o.   MRN: [id number] [facility] Progress Note  16 Days Post-Op  Subjective: CC-  Ongoing bright red bloody Bms, most recently about 10-15 minutes ago.  S/p 3u PRBCs yesterday. Hgb 7.4 this morning from 8.2<<7.5<<5.8 yesterday. VSS currently. Scheduled for EGD today.  Objective: Vital signs in last 24 hours: Temp:  [98 F (36.7 C)-99.8 F (37.7 C)] 98 F (36.7 C) (10/02 0755) Pulse Rate:  [76-97] 90 (10/02 0900) Resp:  [13-26] 13 (10/02 0900) BP: (98-172)/(52-81) 122/64 (10/02 0900) SpO2:  [96 %-100 %] 97 % (10/02 0900) Weight:  [103.1 kg] 103.1 kg (10/02 0459) Last BM Date : 01/09/23  Intake/Output from previous day: 10/01 0701 - 10/02 0700 In: 5339 [I.V.:4300.7; Blood:988.3; IV Piggyback:50] Out: 930 [Urine:930] Intake/Output this shift: Total I/O In: 116.7 [I.V.:116.7] Out: -   PE: Gen: alert, NAD Pulm: respirations unlabored  Abd: mildly distended but soft, no TTP, cdi dressing to midline incision  Lab Results:  Recent Labs    01/09/23 2116 01/10/23 0333  WBC 14.4* 11.5*  HGB 8.2* 7.4*  HCT 24.0* 21.5*  PLT 161 158   BMET Recent Labs    01/09/23 0452 01/09/23 1132 01/10/23 0333  NA 142 142 139  K 4.4 4.1 3.8  CL 117*  --  114*  CO2 12*  --  18*  GLUCOSE 156*  --  148*  BUN 119*  --  120*  CREATININE 2.77*  --  2.73*  CALCIUM 7.6*  --  7.3*   PT/INR No results for input(s): "LABPROT", "INR" in the last 72 hours. CMP     Component Value Date/Time   NA 139 01/10/2023 0333   K 3.8 01/10/2023 0333   CL 114 (H) 01/10/2023 0333   CO2 18 (L) 01/10/2023 0333   GLUCOSE 148 (H) 01/10/2023 0333   BUN 120 (H) 01/10/2023 0333   CREATININE 2.73 (H) 01/10/2023 0333   CALCIUM 7.3 (L) 01/10/2023 0333   PROT 4.3 (L) 01/08/2023 0500   PROT 6.3 03/16/2020 0800   ALBUMIN 1.5 (L) 01/10/2023 0333   ALBUMIN 4.3 03/16/2020 0800   AST 79 (H) 01/08/2023 0500   ALT 198 (H) 01/08/2023 0500    ALKPHOS 96 01/08/2023 0500   BILITOT 0.6 01/08/2023 0500   BILITOT 0.8 03/16/2020 0800   GFRNONAA 25 (L) 01/10/2023 0333   GFRAA >60 04/14/2015 1127   Lipase     Component Value Date/Time   LIPASE 28 11/27/2022 0755       Studies/Results: DG Chest Port 1 View  Result Date: 01/09/2023 CLINICAL DATA:  Shortness of breath with abdominal pain for several days. History of hypertension. EXAM: PORTABLE CHEST 1 VIEW COMPARISON:  Radiographs 12/29/2022 and 12/23/2022. Chest CT 12/29/2022. FINDINGS: 1121 hours. Interval extubation and removal of the enteric tube. Right IJ central venous catheter has been removed. Left IJ central venous catheter appears unchanged, projecting to the mid SVC level. The heart size and mediastinal contours are stable. There are persistent low lung volumes. The lungs appear clear. No pleural effusion or pneumothorax. The bones appear unchanged, without acute findings. Telemetry leads overlie the chest. IMPRESSION: Interval extubation and removal of the enteric tube and right IJ central venous catheter. No acute cardiopulmonary process. Electronically Signed   By: Carey Bullocks M.D.   On: 01/09/2023 13:23   DG Swallowing Func-Speech Pathology  Result Date: 01/08/2023 Table formatting from the original result was not included. Modified Barium Swallow Study Patient  Details Name: MUKUND WEINREB MRN: [id number] Date of Birth: 1955-06-01 Today's Date: 01/08/2023 HPI/PMH: HPI: Pt is a 67 yo male presenting to ED from OP vascular surgery office with known mesenteric artery stenosis s/p SMA angiogram. Admitted 9/11 with several week history of abdominal pain with 30 lb weight loss. W/u included mesenteric ischemia and necrotic area of jejunum s/p exploratory laparotomy and small bowel resection 9/12, reopening of laparotomy 9/14, second reopening and small bowel resection with anastomosis x2 9/26. Intubated 9/12-9/22. PMH includes testicular cancer, HTN, retinal detachment, GERD, HTN,  HLD Clinical Impression: Clinical Impression: Pt presents with a suspected cognitively based oral dysphagia, although his swallow is overall functional. Suspect the taste of the barium contributed to oral holding and gagging, although oral holding of purees was also noted at bedside. Only one instance of trace, transient penetration occurred as pt was attempting to swallow the pill with thin liquids and seemingly gagged. He exhibited great airway protection throughout this event and no further penetration/aspiration was observed throughout trials of nectar or honey thick liquids, purees, and solids. Pt required frequent breaks and had increased WOB at times. Given pt's fatigue and cognition, recommend initiating a diet of full liquids with good potential to upgrade as mentation improves. He will require total assistance for feeding. Question pt's ability to attend throughout the course of a meal and achieve adequate oral intake at this time, therefore, he may require continued TPN until adequate intake is achieved. Will continue to follow to target diet toleration and upgrade as able. Factors that may increase risk of adverse event in presence of aspiration [employer]ye Oaks [employer] Coots 2021): Factors that may increase risk of adverse event in presence of aspiration Rubye Oaks [employer] Clearance Coots 2021): Respiratory or GI disease; Reduced cognitive function; Frail or deconditioned; Dependence for feeding and/or oral hygiene Recommendations/Plan: Swallowing Evaluation Recommendations Swallowing Evaluation Recommendations Recommendations: PO diet PO Diet Recommendation: Full liquid diet Liquid Administration via: Spoon; Cup; Straw Medication Administration: Whole meds with puree Supervision: Full assist for feeding; Full supervision/cueing for swallowing strategies Swallowing strategies  : Minimize environmental distractions; Slow rate; Small bites/sips Postural changes: Position pt fully upright for meals Oral care recommendations: Oral care  BID (2x/day); Staff/trained caregiver to provide oral care Treatment Plan Treatment Plan Treatment recommendations: Therapy as outlined in treatment plan below Follow-up recommendations: Acute inpatient rehab (3 hours/day) Functional status assessment: Patient has had a recent decline in their functional status and demonstrates the ability to make significant improvements in function in a reasonable and predictable amount of time. Treatment frequency: Min 2x/week Treatment duration: 2 weeks Interventions: Aspiration precaution training; Oropharyngeal exercises; Patient/family education; Trials of upgraded texture/liquids; Diet toleration management by SLP Recommendations Recommendations for follow up therapy are one component of a multi-disciplinary discharge planning process, led by the attending physician.  Recommendations may be updated based on patient status, additional functional criteria and insurance authorization. Assessment: Orofacial Exam: Orofacial Exam Oral Cavity: Oral Hygiene: Dried secretions Oral Cavity - Dentition: Adequate natural dentition Orofacial Anatomy: WFL Oral Motor/Sensory Function: WFL Anatomy: Anatomy: Suspected cervical osteophytes Boluses Administered: Boluses Administered Boluses Administered: Thin liquids (Level 0); Mildly thick liquids (Level 2, nectar thick); Moderately thick liquids (Level 3, honey thick); Puree; Solid  Oral Impairment Domain: Oral Impairment Domain Lip Closure: Interlabial escape, no progression to anterior lip Tongue control during bolus hold: Escape to lateral buccal cavity/floor of mouth Bolus preparation/mastication: Timely and efficient chewing and mashing Bolus transport/lingual motion: Delayed initiation of tongue motion (oral holding) Oral residue: Complete oral clearance  Patient ID: KERRIGAN GLENDENING, male   DOB: 04/12/1955, 67 y.o.   MRN: [id number] [facility] Progress Note  16 Days Post-Op  Subjective: CC-  Ongoing bright red bloody Bms, most recently about 10-15 minutes ago.  S/p 3u PRBCs yesterday. Hgb 7.4 this morning from 8.2<<7.5<<5.8 yesterday. VSS currently. Scheduled for EGD today.  Objective: Vital signs in last 24 hours: Temp:  [98 F (36.7 C)-99.8 F (37.7 C)] 98 F (36.7 C) (10/02 0755) Pulse Rate:  [76-97] 90 (10/02 0900) Resp:  [13-26] 13 (10/02 0900) BP: (98-172)/(52-81) 122/64 (10/02 0900) SpO2:  [96 %-100 %] 97 % (10/02 0900) Weight:  [103.1 kg] 103.1 kg (10/02 0459) Last BM Date : 01/09/23  Intake/Output from previous day: 10/01 0701 - 10/02 0700 In: 5339 [I.V.:4300.7; Blood:988.3; IV Piggyback:50] Out: 930 [Urine:930] Intake/Output this shift: Total I/O In: 116.7 [I.V.:116.7] Out: -   PE: Gen: alert, NAD Pulm: respirations unlabored  Abd: mildly distended but soft, no TTP, cdi dressing to midline incision  Lab Results:  Recent Labs    01/09/23 2116 01/10/23 0333  WBC 14.4* 11.5*  HGB 8.2* 7.4*  HCT 24.0* 21.5*  PLT 161 158   BMET Recent Labs    01/09/23 0452 01/09/23 1132 01/10/23 0333  NA 142 142 139  K 4.4 4.1 3.8  CL 117*  --  114*  CO2 12*  --  18*  GLUCOSE 156*  --  148*  BUN 119*  --  120*  CREATININE 2.77*  --  2.73*  CALCIUM 7.6*  --  7.3*   PT/INR No results for input(s): "LABPROT", "INR" in the last 72 hours. CMP     Component Value Date/Time   NA 139 01/10/2023 0333   K 3.8 01/10/2023 0333   CL 114 (H) 01/10/2023 0333   CO2 18 (L) 01/10/2023 0333   GLUCOSE 148 (H) 01/10/2023 0333   BUN 120 (H) 01/10/2023 0333   CREATININE 2.73 (H) 01/10/2023 0333   CALCIUM 7.3 (L) 01/10/2023 0333   PROT 4.3 (L) 01/08/2023 0500   PROT 6.3 03/16/2020 0800   ALBUMIN 1.5 (L) 01/10/2023 0333   ALBUMIN 4.3 03/16/2020 0800   AST 79 (H) 01/08/2023 0500   ALT 198 (H) 01/08/2023 0500    ALKPHOS 96 01/08/2023 0500   BILITOT 0.6 01/08/2023 0500   BILITOT 0.8 03/16/2020 0800   GFRNONAA 25 (L) 01/10/2023 0333   GFRAA >60 04/14/2015 1127   Lipase     Component Value Date/Time   LIPASE 28 11/27/2022 0755       Studies/Results: DG Chest Port 1 View  Result Date: 01/09/2023 CLINICAL DATA:  Shortness of breath with abdominal pain for several days. History of hypertension. EXAM: PORTABLE CHEST 1 VIEW COMPARISON:  Radiographs 12/29/2022 and 12/23/2022. Chest CT 12/29/2022. FINDINGS: 1121 hours. Interval extubation and removal of the enteric tube. Right IJ central venous catheter has been removed. Left IJ central venous catheter appears unchanged, projecting to the mid SVC level. The heart size and mediastinal contours are stable. There are persistent low lung volumes. The lungs appear clear. No pleural effusion or pneumothorax. The bones appear unchanged, without acute findings. Telemetry leads overlie the chest. IMPRESSION: Interval extubation and removal of the enteric tube and right IJ central venous catheter. No acute cardiopulmonary process. Electronically Signed   By: Carey Bullocks M.D.   On: 01/09/2023 13:23   DG Swallowing Func-Speech Pathology  Result Date: 01/08/2023 Table formatting from the original result was not included. Modified Barium Swallow Study Patient

## 2023-01-10 NOTE — Interval H&P Note (Signed)
History and Physical Interval Note: For EGD and attempted unprepped colonoscopy today to evaluate ongoing GI blood loss.  Complicated GI history as previously described. I have been in communication with surgical and critical care teams who requested direct visualization endoscopically HIGHER THAN BASELINE RISK.The nature of the procedure, as well as the risks, benefits, and alternatives were carefully and thoroughly reviewed with the patient. Ample time for discussion and questions allowed. The patient understood, was satisfied, and agreed to proceed.      Latest Ref Rng & Units 01/10/2023    3:33 AM 01/09/2023    9:16 PM 01/09/2023    3:27 PM  CBC  WBC 4.0 - 10.5 K/uL 11.5  14.4  13.5   Hemoglobin 13.0 - 17.0 g/dL 7.4  8.2  7.5   Hematocrit 39.0 - 52.0 % 21.5  24.0  22.3   Platelets 150 - 400 K/uL 158  161  172      01/10/2023 11:23 AM  [name]  has presented today for surgery, with the diagnosis of gastrointestinal bleeding.  The various methods of treatment have been discussed with the patient and family. After consideration of risks, benefits and other options for treatment, the patient has consented to  Procedure(s): ESOPHAGOGASTRODUODENOSCOPY (EGD) WITH PROPOFOL (N/A) as a surgical intervention.  The patient's history has been reviewed, patient examined, no change in status, stable for surgery.  I have reviewed the patient's chart and labs.  Questions were answered to the patient's satisfaction.     Carie Caddy Sigourney Portillo

## 2023-01-10 NOTE — Progress Notes (Addendum)
  Progress Note    01/10/2023 8:38 AM 16 Days Post-Op  Subjective:  bloody bowel movement overnight.  Back pain this morning   Vitals:   01/10/23 0755 01/10/23 0800  BP:  (!) 129/59  Pulse:  88  Resp:  17  Temp: 98 F (36.7 C)   SpO2:  97%   Physical Exam: Lungs:  non labored Extremities:  moving all ext well Abdomen:  soft, non distended Neurologic: A&O  CBC    Component Value Date/Time   WBC 11.5 (H) 01/10/2023 0333   RBC 2.41 (L) 01/10/2023 0333   HGB 7.4 (L) 01/10/2023 0333   HCT 21.5 (L) 01/10/2023 0333   PLT 158 01/10/2023 0333   MCV 89.2 01/10/2023 0333   MCH 30.7 01/10/2023 0333   MCHC 34.4 01/10/2023 0333   RDW 16.7 (H) 01/10/2023 0333   LYMPHSABS 0.9 10/30/2022 1654   MONOABS 0.6 10/30/2022 1654   EOSABS 0.3 10/30/2022 1654   BASOSABS 0.0 10/30/2022 1654    BMET    Component Value Date/Time   NA 139 01/10/2023 0333   K 3.8 01/10/2023 0333   CL 114 (H) 01/10/2023 0333   CO2 18 (L) 01/10/2023 0333   GLUCOSE 148 (H) 01/10/2023 0333   BUN 120 (H) 01/10/2023 0333   CREATININE 2.73 (H) 01/10/2023 0333   CALCIUM 7.3 (L) 01/10/2023 0333   GFRNONAA 25 (L) 01/10/2023 0333   GFRAA >60 04/14/2015 1127    INR    Component Value Date/Time   INR 1.7 (H) 12/29/2022 0809     Intake/Output Summary (Last 24 hours) at 01/10/2023 4098 Last data filed at 01/10/2023 0800 Gross per 24 hour  Intake 5455.75 ml  Output 930 ml  Net 4525.75 ml     Assessment/Plan:  67 y.o. male is s/p SMA stenting and small bowel resection 16 Days Post-Op   Hemodynamically stable; Hgb 7.4 after three units yesterday Tentative plan is for upper and lower endoscopy today with GI Continue to hold all anticoagulation We will avoid coil embolization with IR if at all possible due to high likelihood of further bowel ischemia  [name] Rutter, PA-C [facility] [phone] 01/10/2023 8:38 AM  VASCULAR STAFF ADDENDUM: I have independently interviewed and  examined the patient. I agree with the above.  I had multiple conversations with [name] [name] and his wife regarding his overall clinical status.  We are trying to manage this conservatively as any intervention would likely result in further bowel ischemia. Both he and his wife are aware that his current pathology is concerning as the majority of his bowel was living off of a 3.5 mm stent.  I appreciate GI offering to scope as I think this will rule out upper and lower GI bleeds as culprits.  [name] Sparrow MD [facility] Phone Number: [phone] 01/10/2023 8:47 AM

## 2023-01-10 NOTE — Progress Notes (Signed)
PHARMACY - TOTAL PARENTERAL NUTRITION CONSULT NOTE  Indication:  SMA syndrome w associated weight loss  Patient Measurements: Height: 5\' 11"  (180.3 cm) Weight: 103.1 kg (227 lb 4.7 oz) IBW/kg (Calculated) : 75.3 TPN AdjBW (KG): 80.4 Body mass index is 31.7 kg/m. Usual Weight: 95 kg, has lost 50lbs recently   Assessment:  67 yo M admitted with abdominal pain, weight loss, and poor PO intake. 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 He is now s/p SMA stent placement. He was initially left is discontinuity with open abdomen and was closed on 9/16. TPN was initiated 9/14.   Pt evaluated by speech therapy 9/29 - was able to tolerate trials of ice chips, thin liquid, and puree w/o overt signs or aspiration or difficulty swallowing.  MBS completed 9/30 - cognitive based oral dysphagia; recommended for trial of full liquids and upgrade as mentation allows. Requires full assistance for feeding until mentation improves.   Glucose / Insulin: A1c 6.3% - CBGs < 160; off insulin Electrolytes:  Cl 114; CO2 18 (max acetate in TPN, IV bicarb x1 then bicarb infusion x10 hrs), CoCa 9.3, Others wnl  Renal: off CRRT 9/24, likely iHD soon. SCr 2.73, BUN 120 Hepatic: AST down 79, ALT down 198, alk phos/tbili wnl, albumin 1.5, TG 88 (wnl) Intake / Output; MIVF:  albumin x1; UOP 0.4 ml/kg/hr, LBM 10/1 x4. RBC 998 ml with ongoing bloody BM 9/30-10/1 (on DDAVP) GI Imaging:  9/20 CTA: improved ostial stenosis post stenting, thrombus/occlusion of SMA/ileocolic artery improving, surgical site has increased dilatation, L lung nodule GI Surgeries / Procedures:  9/14 re-ex lap, left in discontinuity with plans for re-exploration  9/16 re-ex lap, SBR with anastomosis x2, abdominal closure Wounds (aside from abd): Deep tissue pressure injury to B/L buttocks  Central access: CVC LIJ 12/23/22 TPN start date: 12/23/22  Nutritional Goals: Goal concentrated  TPN rate is 80 mL/hr (provides 140 g AA and 2400 kCal per day) Goal un-concentrated TPN is 100 mL/hr (provides 140g AA and 2253 kCal per day)  RD Estimated Needs Total Energy Estimated Needs: 2300-2500 kcals Total Protein Estimated Needs: 140 -150 g Total Fluid Estimated Needs: >/= 2.3 L  Current Nutrition:  NPO and TPN   Plan:  After discussion with team, change to concentrated TPN at goal rate of 80 ml/hr to meet 100% of needs Electrolytes in TPN: adjusted to keep about the same as last bag with volume change: Na 25 mEq/L (add 9/30 given Na trend), K 20 mEq/L, Ca 5 mEq/L, Mg 7 mEq/L, Phos 0 mmol/L, Cl:Ac - max acetate.  Add standard MVI and trace elements to TPN Monitor TPN labs on Mon/Thurs, daily renal function panel per MD  Additional IVF per nephrology   Alphia Moh, PharmD, BCPS, [facility] Clinical Pharmacist  Please check AMION for all [facility] Pharmacy phone numbers After 10:00 PM, call Main Pharmacy [phone]

## 2023-01-10 NOTE — TOC Progression Note (Signed)
Transition of Care [facility]) - Progression Note    Patient Details  Name: [name] [name] MRN: [id number] Date of Birth: [birth year]  Transition of Care [facility]) CM/SW Contact  Elliot Cousin, RN Phone Number: [phone] 01/10/2023, 5:29 PM  Clinical Narrative:   CM spoke to pt's wife, wife states she does IP rehab vs SNF rehab. Waiting PT/OT recommendations. Pt was independent pta admission.    Expected Discharge Plan: IP Rehab Facility Barriers to Discharge: Continued Medical Work up  Expected Discharge Plan and Services In-house Referral: NA Discharge Planning Services: CM Consult Post Acute Care Choice: IP Rehab Living arrangements for the past 2 months: Single Family Home                                       Social Determinants of Health (SDOH) Interventions SDOH Screenings   Food Insecurity: No Food Insecurity (12/20/2022)  Housing: Low Risk  (12/20/2022)  Transportation Needs: No Transportation Needs (12/20/2022)  Utilities: Not At Risk (12/20/2022)  Alcohol Screen: Low Risk  (11/20/2022)  Depression (PHQ2-9): Low Risk  (11/20/2022)  Financial Resource Strain: Low Risk  (12/01/2022)  Physical Activity: Sufficiently Active (12/01/2022)  Social Connections: Moderately Integrated (12/01/2022)  Stress: No Stress Concern Present (12/01/2022)  Tobacco Use: Low Risk  (01/10/2023)  Health Literacy: Adequate Health Literacy (11/20/2022)    Readmission Risk Interventions    12/26/2022   11:40 AM  Readmission Risk Prevention Plan  Transportation Screening Complete  HRI or Home Care Consult Complete  Social Work Consult for Recovery Care Planning/Counseling Complete  Palliative Care Screening Not Applicable  Medication Review Oceanographer) Complete

## 2023-01-10 NOTE — Progress Notes (Signed)
NAME:  [name] [name], MRN:  [id number], DOB:  12/11/1955, LOS: 21 ADMISSION DATE:  12/20/2022, CONSULTATION DATE: 9/12 REFERRING MD: Dr. Jacqulyn Bath, CHIEF COMPLAINT: Mesenteric ischemia  History of Present Illness:   67 year old male with past medical history as below, which is significant for intestinal stromal tumor removal in 2022, known mesenteric stenosis, GERD, hypertension, and hyperlipidemia.  In the few weeks prior to presentation on 9/11 he had been evaluated at several campuses for abdominal pain with noncontrasted CTs of the abdomen which were unrevealing.  He continued to have abdominal pain, weight loss, and poor p.o. intake.  He presented [name] on 9/11 and was admitted to the hospitalist for abdominal pain with concern for superior mesenteric artery thrombus.  He was started on heparin infusion.  Vascular surgery was consulted and ultimately a CT angiogram of the abdomen and pelvis was done 9/12 demonstrating acute occlusion of the distal SMA as well as occlusion of the inferior mesenteric artery.  He was taken to the operating room emergently and and was found to have an necrotic area of the jejunum.  This was resected, and SMA stent was placed, and the patient was left with an open abdomen.  He also remained on the mechanical ventilator postoperatively and PCCM was asked to admit for ventilator management.  Pertinent  Medical History   has a past medical history of Ankle fracture, Cancer (HCC) (1974), Cataract, GERD (gastroesophageal reflux disease), colonic polyps, Hyperlipidemia, Hypertension, Kidney stones, Patella fracture, Retinal detachment, Retinal tear of right eye, and Syncopal episodes (2008).   Significant Hospital Events: Including procedures, antibiotic start and stop dates in addition to other pertinent events   9/11 admitted with abdominal pain 9/12 CT demonstrating mesenteric occlusion, bowel resection and stent placed, to ICU on vent. Left in discontinuity  9/14  back to OR. Left in discontinuity as still concern about bowel integrity 9/16 back to OR  s sig areas of ischemia w/ 2 areas of perforation, another 66cm section of jejunum was resected and area of discontinuity was reanastomosed. Changed to argatroban post-op as unable to get heparin levels detectable  9/17 dropping RASS goal to 0 to -1, assessing for readiness to wean, serum creatinine and BUN still climbing some but making urine 9/18 iHD  9/20 rising pressor need, anemia: CTA abd: nothing emergent, transfused 9/22 extubated 9/25 poor tolerance of NGT clamping. Abx ended  9/26 trying clamping again. Adding PRN antihypertensives. Temp incr a bit, 100.6. holding off on HD and tunneled HD cath given incr UOP  9/26 transferred out of ICU 9/29 Argatroban stopped and held due to rectal bleeding 9/30 Hgb 6.1 at 1250, 2u PRBC ordered then had large bloody BM again overnight, repeat H/H pending. Was hypotensive initially to 70-80s but responded to 200cc IVF. Likely needs more blood, TRH to follow.  9/30 PCCM asked to see incase has ongoing bleeding and hypotension and needs ICU care again 10/1 ICU transfer for GI bleed. AC held.  10/2 endoscopy  Interim History / Subjective:  Continues to have BRBPR Hgb down to 7.4 BP holding.  C/o painful linen changes due to raw skin.    Objective   Blood pressure (!) 129/59, pulse 88, temperature 98 F (36.7 C), temperature source Oral, resp. rate 17, height 5\' 11"  (1.803 m), weight 103.1 kg, SpO2 97%.        Intake/Output Summary (Last 24 hours) at 01/10/2023 0903 Last data filed at 01/10/2023 0800 Gross per 24 hour  Intake 5455.75 ml  Output  930 ml  Net 4525.75 ml   Filed Weights   01/08/23 0500 01/09/23 0428 01/10/23 0459  Weight: 103.7 kg 102.1 kg 103.1 kg    Examination: Pale middle aged male in NAD Clear bilateral breath sounds RRR, no MRG Soft, NT, ND. Incision sites dressed without strikethrough.  Alert, oriented, non-focal Global  anasarca  Hemoglobin down to 7.4 from 8.2 BUN stable 120 Creat stable 2.73  Assessment & Plan:   Acute mesenteric ischemia -s/p thrombectomy and SMA stent placement with ex lap and SB resection 9/12 followed by re-exploration x 2 9/14 and 9/16.  Completed course of vanc/mero on 9/25. Per vascular surgery the majority of his bowel is surviving off a 3.69mm stent. - management per vascular surgery - avoid hypotension   Postoperative GIB, ABLA ongoing - DDAVP given 10/1 - 3 units PRBC given - hgb 7.4 - repeat hgb this afternoon - Hopefully EGD is helpful for hemostasis.  - AC on hold - Avoid coagulopathy, hypothermia, acidemia   Afib now NSR -  on Amio - Tele  HTN HLD - home therapies on hold.   AKI on CKD 3, acidemia - nephro following - May need further HD - trend UOP and chemistry  Physical deconditioning  - PT/OT when able  Nutrition - TPN - NPO - Surgery following  Best Practice (right click and "Reselect all SmartList Selections" daily)   Diet/type: TPN DVT prophylaxis: argatroban on hold GI prophylaxis: PPI Lines: Central line Foley:  Yes, and it is still needed Code Status:  full code Last date of multidisciplinary goals of care discussion [updated wife at bedside 9/22]    Joneen Roach, AGACNP-BC [facility]  See Amion for personal pager PCCM on call pager [phone] until 7pm. Please call Elink 7p-7a. [phone]  01/10/2023 9:13 AM

## 2023-01-10 NOTE — Op Note (Addendum)
[facility] Patient Name: [name] [name] Procedure Date : 01/10/2023 MRN: [id number] Attending MD: Beverley Fiedler , MD, [id number] Date of Birth: [birth year] CSN: [id number] Age: 67 Admit Type: Inpatient Procedure:                Colonoscopy Indications:              Gastrointestinal bleeding, Evaluation of                            unexplained GI bleeding presenting with                            Hematochezia, history of recent mesenteric ischemia                            s/p stent placement and bowel resection, personal                            history of GIST (previous treatment with imatinib                            stopped due to rash and renal dysfunction) Providers:                Carie Caddy. Rhea Belton, MD, Glory Rosebush, RN, Priscella Mann,                            Technician Referring MD:             PCCM and vascular and general surgery teams Medicines:                Monitored Anesthesia Care Complications:            No immediate complications. Estimated Blood Loss:     Estimated blood loss was minimal. Procedure:                Pre-Anesthesia Assessment:                           - Prior to the procedure, a History and Physical                            was performed, and patient medications and                            allergies were reviewed. The patient's tolerance of                            previous anesthesia was also reviewed. The risks                            and benefits of the procedure and the sedation                            options and risks were discussed with the patient.  All questions were answered, and informed consent                            was obtained. Prior Anticoagulants: The patient has                            taken no anticoagulant or antiplatelet agents. ASA                            Grade Assessment: III - A patient with severe                            systemic disease. After reviewing the  risks and                            benefits, the patient was deemed in satisfactory                            condition to undergo the procedure.                           After obtaining informed consent, the colonoscope                            was passed under direct vision. Throughout the                            procedure, the patient's blood pressure, pulse, and                            oxygen saturations were monitored continuously. The                            CF-HQ190L ([id number]) Olympus colonoscope was                            introduced through the anus with the intention of                            advancing to the cecum. The scope was advanced to                            the descending colon before the procedure was                            aborted. Medications were given. The colonoscopy                            was performed without difficulty. The patient                            tolerated the procedure well. The quality of the  bowel preparation was adequate to the descending                            colon (no prep given). Scope In: 1:15:39 PM Scope Out: 1:26:00 PM Total Procedure Duration: 0 hours 10 minutes 21 seconds  Findings:      The digital rectal exam was normal.      Areas of severely ulcerated mucosa with stigmata of recent bleeding were       present in the rectum, in the recto-sigmoid colon. These ulcers are deep       and involve 80+% of the rectum. Scattered apthous ulcerations in the       distal sigmoid colon (very mild). Biopsies were taken with a cold       forceps for histology (Jar 1 = descending/sigmoid; Jar 2 = rectum) (rule       out ischemia but also viral proctitis).      Multiple small-mouthed diverticula were found in the sigmoid colon and       descending colon.      The exam aborted in the descending colon due to lack of prep (pertinent       findings above). Impression:               - Deep  and severe mucosal ulceration in the rectum                            and in the recto-sigmoid colon (ischemic or                            infectious, very unlikely to be IBD given lack of                            this history and recent ischemic bowel injury).                            Biopsied. Not actively bleeding today (if                            rebleeding treatment would be limited to hemospray                            or PuraStat gel as closure devices and cautery                            unlikely to benefit this type of injury).                           - Mild diverticulosis in the sigmoid colon and in                            the descending colon without active bleeding.                           - Exam aborted in the descending colon due to lack  of bowel prep and pertinent rectal findings. Moderate Sedation:      N/A Recommendation:           - Return patient to hospital ward for ongoing care.                           - Advance diet as tolerated.                           - Continue present medications.                           - Would avoid anticoagulation at this time given                            severity of rectal ulceration and recent bleeding.                           - Await pathology results. Procedure Code(s):        --- Professional ---                           [phone], 52, Colonoscopy, flexible; with biopsy,                            single or multiple Diagnosis Code(s):        --- Professional ---                           K62.6, Ulcer of anus and rectum                           K63.3, Ulcer of intestine                           K92.2, Gastrointestinal hemorrhage, unspecified                           K92.1, Melena (includes Hematochezia)                           K57.30, Diverticulosis of large intestine without                            perforation or abscess without bleeding CPT copyright 2022 American Medical  Association. All rights reserved. The codes documented in this report are preliminary and upon coder review may  be revised to meet current compliance requirements. Beverley Fiedler, MD 01/10/2023 1:43:18 PM This report has been signed electronically. Number of Addenda: 0

## 2023-01-10 NOTE — Progress Notes (Signed)
Inpatient Rehab Admissions Coordinator:   CIR following, not currently stable or participating well enough for therapies. I will follow for stability and progress and participation with therapies.   Megan Salon, MS, CCC-SLP Rehab Admissions Coordinator  [phone] (celll) [phone] (office)

## 2023-01-10 NOTE — Progress Notes (Signed)
SLP Cancellation Note  Patient Details Name: [name] [name] MRN: [id number] DOB: Feb 27, 1956   Cancelled treatment:        Concern for ongoing GIB noted with possible endoscopy.  SLP will hold pending results.   Kerrie Pleasure, MA, CCC-SLP Acute Rehabilitation Services Office: [phone] 01/10/2023, 7:49 AM

## 2023-01-10 NOTE — Progress Notes (Signed)
eLink Physician-Brief Progress Note Patient Name: [name] [name] DOB: 1955-07-06 MRN: [id number]   Date of Service  01/10/2023  HPI/Events of Note  67 year old male with complicated past medical and gastrointestinal history including history of malignant small bowel GIST with resection and 2023, prior GI bleeds with history of duodenal ulcers and intestinal angioectasias, diverticulosis with previous hemorrhage, history of gastritis and duodenitis.  Pending nuclear medicine tagged RBC scan.  Hemoglobin now 7.4 down from 8.2.  eICU Interventions  Concern for ongoing GI bleeding.  DDAVP x 1.  Anticoagulation held. Likely needs endoscopy today.  No immediate intervention, anticipate nuc med scan and then intervention.     Intervention Category Intermediate Interventions: Coagulopathy - evaluation and management;Bleeding - evaluation and treatment with blood products  [name] [name] 01/10/2023, 5:22 AM

## 2023-01-10 NOTE — Anesthesia Preprocedure Evaluation (Addendum)
Anesthesia Evaluation  Patient identified by MRN, date of birth, ID bandGeneral Assessment Comment:Pt sleepy, but answers appropriate  Reviewed: Allergy & Precautions, NPO status , Patient's Chart, lab work & pertinent test results  History of Anesthesia Complications Negative for: history of anesthetic complications  Airway Mallampati: I  TM Distance: >3 FB Neck ROM: Full    Dental  (+) Dental Advisory Given   Pulmonary neg pulmonary ROS   Pulmonary exam normal        Cardiovascular hypertension, Pt. on medications (-) angina + dysrhythmias Atrial Fibrillation  Rhythm:Regular Rate:Normal  07/2022 ECHO: EF 60-65%.  1. The LV has normal function, no regional wall motion abnormalities. There is mild LVH. Grade I diastolic dysfunction (impaired relaxation).   2. RVF is normal. The right ventricular size is normal.   3. The mitral valve is normal in structure. No evidence of mitral valve regurgitation.   4. The aortic valve is grossly normal. Aortic valve regurgitation is not visualized.     Neuro/Psych negative neurological ROS     GI/Hepatic ,GERD  Controlled,,Elevated LFTs with mesenteric ischemia 1. intestinal stromal tumor removal in 2022, known mesenteric stenosis 2. acute occlusion of the distal SMA as well as occlusion of the inferior mesenteric artery.  He was taken to the operating room emergently and and was found to have an necrotic area of the jejunum.  This was resected, and SMA stent was placed, and the patient was left with an open abdomen.  He also remained on the mechanical ventilator postoperatively 3. Acute GI bleed again   Endo/Other  negative endocrine ROS    Renal/GU Renal InsufficiencyRenal disease     Musculoskeletal   Abdominal   Peds  Hematology  (+) Blood dyscrasia (Hb 7.4, plt 158k), anemia   Anesthesia Other Findings H/o testicular cancer  Reproductive/Obstetrics                               Anesthesia Physical Anesthesia Plan  ASA: 4  Anesthesia Plan: MAC   Post-op Pain Management: Minimal or no pain anticipated   Induction:   PONV Risk Score and Plan: 1 and Treatment may vary due to age or medical condition  Airway Management Planned: Natural Airway and Nasal Cannula  Additional Equipment: None  Intra-op Plan:   Post-operative Plan:   Informed Consent: I have reviewed the patients History and Physical, chart, labs and discussed the procedure including the risks, benefits and alternatives for the proposed anesthesia with the patient or authorized representative who has indicated his/her understanding and acceptance.     Dental advisory given  Plan Discussed with: CRNA and Surgeon  Anesthesia Plan Comments:          Anesthesia Quick Evaluation

## 2023-01-11 DIAGNOSIS — K633 Ulcer of intestine: Secondary | ICD-10-CM | POA: Diagnosis not present

## 2023-01-11 DIAGNOSIS — K551 Chronic vascular disorders of intestine: Secondary | ICD-10-CM | POA: Diagnosis not present

## 2023-01-11 LAB — CBC
HCT: 20.5 % — ABNORMAL LOW (ref 39.0–52.0)
HCT: 25 % — ABNORMAL LOW (ref 39.0–52.0)
Hemoglobin: 6.8 g/dL — CL (ref 13.0–17.0)
Hemoglobin: 8.4 g/dL — ABNORMAL LOW (ref 13.0–17.0)
MCH: 29.7 pg (ref 26.0–34.0)
MCH: 30.8 pg (ref 26.0–34.0)
MCHC: 33.2 g/dL (ref 30.0–36.0)
MCHC: 33.6 g/dL (ref 30.0–36.0)
MCV: 89.5 fL (ref 80.0–100.0)
MCV: 91.6 fL (ref 80.0–100.0)
Platelets: 174 10*3/uL (ref 150–400)
Platelets: 218 10*3/uL (ref 150–400)
RBC: 2.29 MIL/uL — ABNORMAL LOW (ref 4.22–5.81)
RBC: 2.73 MIL/uL — ABNORMAL LOW (ref 4.22–5.81)
RDW: 17.5 % — ABNORMAL HIGH (ref 11.5–15.5)
RDW: 17.1 % — ABNORMAL HIGH (ref 11.5–15.5)
WBC: 9.7 10*3/uL (ref 4.0–10.5)
WBC: 12 10*3/uL — ABNORMAL HIGH (ref 4.0–10.5)
nRBC: 0 % (ref 0.0–0.2)
nRBC: 0.2 % (ref 0.0–0.2)

## 2023-01-11 LAB — COMPREHENSIVE METABOLIC PANEL
ALT: 96 U/L — ABNORMAL HIGH (ref 0–44)
AST: 50 U/L — ABNORMAL HIGH (ref 15–41)
Albumin: 1.6 g/dL — ABNORMAL LOW (ref 3.5–5.0)
Alkaline Phosphatase: 69 U/L (ref 38–126)
Anion gap: 9 (ref 5–15)
BUN: 130 mg/dL — ABNORMAL HIGH (ref 8–23)
CO2: 18 mmol/L — ABNORMAL LOW (ref 22–32)
Calcium: 7.6 mg/dL — ABNORMAL LOW (ref 8.9–10.3)
Chloride: 111 mmol/L (ref 98–111)
Creatinine, Ser: 2.95 mg/dL — ABNORMAL HIGH (ref 0.61–1.24)
GFR, Estimated: 22 mL/min — ABNORMAL LOW (ref >60)
Glucose, Bld: 134 mg/dL — ABNORMAL HIGH (ref 70–99)
Potassium: 3.7 mmol/L (ref 3.5–5.1)
Sodium: 138 mmol/L (ref 135–145)
Total Bilirubin: 0.5 mg/dL (ref 0.3–1.2)
Total Protein: 4.3 g/dL — ABNORMAL LOW (ref 6.5–8.1)

## 2023-01-11 LAB — PHOSPHORUS: Phosphorus: 4.2 mg/dL (ref 2.5–4.6)

## 2023-01-11 LAB — HEMOGLOBIN AND HEMATOCRIT, BLOOD
HCT: 25.8 % — ABNORMAL LOW (ref 39.0–52.0)
HCT: 22.9 % — ABNORMAL LOW (ref 39.0–52.0)
Hemoglobin: 8.6 g/dL — ABNORMAL LOW (ref 13.0–17.0)
Hemoglobin: 7.7 g/dL — ABNORMAL LOW (ref 13.0–17.0)

## 2023-01-11 LAB — GLUCOSE, CAPILLARY
Glucose-Capillary: 116 mg/dL — ABNORMAL HIGH (ref 70–99)
Glucose-Capillary: 127 mg/dL — ABNORMAL HIGH (ref 70–99)
Glucose-Capillary: 132 mg/dL — ABNORMAL HIGH (ref 70–99)
Glucose-Capillary: 136 mg/dL — ABNORMAL HIGH (ref 70–99)
Glucose-Capillary: 145 mg/dL — ABNORMAL HIGH (ref 70–99)

## 2023-01-11 LAB — MAGNESIUM: Magnesium: 2.2 mg/dL (ref 1.7–2.4)

## 2023-01-11 LAB — PREPARE RBC (CROSSMATCH)

## 2023-01-11 MED ORDER — ENSURE ENLIVE PO LIQD
237.0000 mL | Freq: Three times a day (TID) | ORAL | Status: DC
  Administered 2023-01-11 – 2023-01-14 (×3): 237 mL via ORAL

## 2023-01-11 MED ORDER — POTASSIUM CHLORIDE 10 MEQ/100ML IV SOLN
10.0000 meq | INTRAVENOUS | Status: AC
  Administered 2023-01-11 (×4): 10 meq via INTRAVENOUS
  Filled 2023-01-11 (×4): qty 100

## 2023-01-11 MED ORDER — OXYCODONE HCL 5 MG PO TABS
5.0000 mg | ORAL_TABLET | ORAL | Status: DC | PRN
  Administered 2023-01-11 – 2023-01-20 (×23): 5 mg via ORAL
  Filled 2023-01-11 (×24): qty 1

## 2023-01-11 MED ORDER — TRACE MINERALS CU-MN-SE-ZN 300-55-60-3000 MCG/ML IV SOLN
INTRAVENOUS | Status: AC
  Filled 2023-01-11: qty 934.4

## 2023-01-11 MED ORDER — SODIUM CHLORIDE 0.9% IV SOLUTION: Freq: Once | INTRAVENOUS | Status: AC

## 2023-01-11 NOTE — Progress Notes (Signed)
Speech Language Pathology Treatment: Dysphagia  Patient Details Name: [name] [name] MRN: [id number] DOB: [birth year] Today's Date: 01/11/2023 Time: 1014-1027 SLP Time Calculation (min) (ACUTE ONLY): 13 min  Assessment / Plan / Recommendation Clinical Impression  Pt had EGD yesterday that revealed EGD 10/2 revealed normal esophagus, hiatal hernia, patchy mildly erythematous mucosa without bleeding, gastroesophageal flap valve (minima fold, loose to endoscope, hiatal hernia). Recommendations to advance diet as tolerated. Pt observed with thin via straw, pudding, and graham cracker solid. Orally he transited po's without difficulty, masticated cracker timely and did request sip of his diluted oj due to "dry mouth". He was able to clear oral cavity. Pharyngeal swallows did not appear delayed and there were no signs of aspiration. Recommend upgrade to regular texture, continue thin liquids, pills with thin although he did have slight penetration of thin with pill during MBS. Educated Charity fundraiser that if he has difficulty with pills with thin he can have whole in puree. SLP briefly described finding of hiatal hernia (advised to speak with MD for full results of EGD) and possible implications and recommend he stay sitting up after eating. No further ST needed for swallow.  Pt does have goals for cognition that were not addressed today. Suspect cognition may have improved from initial eval and will continue to address.    HPI HPI: Pt is a 67 yo male presenting to ED from OP vascular surgery office with known mesenteric artery stenosis s/p SMA angiogram. Admitted 9/11 with several week history of abdominal pain with 30 lb weight loss. W/u included mesenteric ischemia and necrotic area of jejunum s/p exploratory laparotomy and small bowel resection 9/12, reopening of laparotomy 9/14, second reopening and small bowel resection with anastomosis x2 9/26. Intubated 9/12-9/22. PMH includes testicular cancer, HTN, retinal  detachment, GERD, HTN, HLD. EGD 10/2 revealed normal esophagus, hiatal hernia, patchy mildly erythematous mucosa without bleeding, gastroesophageal flap valve (minima fold, loose to endoscope, hiatal hernai).      SLP Plan  All goals met;Discharge SLP treatment due to (comment)      Recommendations for follow up therapy are one component of a multi-disciplinary discharge planning process, led by the attending physician.  Recommendations may be updated based on patient status, additional functional criteria and insurance authorization.    Recommendations  Diet recommendations: Regular;Thin liquid Liquids provided via: Cup;Straw Medication Administration: Whole meds with liquid Supervision: Staff to assist with self feeding;Intermittent supervision to cue for compensatory strategies Compensations: Slow rate;Small sips/bites Postural Changes and/or Swallow Maneuvers: Seated upright 90 degrees;Upright 30-60 min after meal                  Oral care BID   Frequent or constant Supervision/Assistance Dysphagia, oropharyngeal phase (R13.12)     All goals met;Discharge SLP treatment due to (comment)     [name] [name]  01/11/2023, 10:46 AM

## 2023-01-11 NOTE — Progress Notes (Signed)
Nutrition Follow-up  DOCUMENTATION CODES:   Severe malnutrition in context of acute illness/injury  INTERVENTION:   Continue TPN at goal rate to meet 100% of estimated needs until patient is meeting at least 60% of his estimated nutrition needs PO. Calorie Count x 48 hours to evaluate adequacy of intake. Add Ensure Plus High Protein po TID, each supplement provides 350 kcal and 20 grams of protein. Magic cup TID with meals, each supplement provides 290 kcal and 9 grams of protein.  NUTRITION DIAGNOSIS:   Severe Malnutrition related to acute illness as evidenced by energy intake < or equal to 50% for > or equal to 5 days, percent weight loss.  Ongoing   GOAL:   Patient will meet greater than or equal to 90% of their needs  Met with TPN  MONITOR:   Diet advancement, Labs, Weight trends, Skin, I & O's (TPN)  REASON FOR ASSESSMENT:   Ventilator, Consult New TPN/TNA  ASSESSMENT:   67 yo male admitted with SMA stenosis/thrombosis and SB ischemia requiring SMA thrombectomy and stent followed by ex lap and SB resection. Pt with poor po intake and weight loss for several weeks PTA. 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, CKD 3, GERD, hx testicular cancer, HLD, HTN  9/11 Admitted 9/12 OR: acute on chronic mesenteric ischemia with necrotic area of jejunum requiring SB resection, bowel left in discontinuity, abdomen open with ABTherea wound VAC, SMA thrombectomy and stent 9/14 OR: reopening of recent laparotomy, evaluation of SMA with Doppler including mesentery of small bowel, placement of abthera device  9/16 OR: sig areas of ischemia with 2 areas of perforation, another 66 cm section of jejunum was resected and area of discontinuity was reanastomosed 9/18 iHD 9/19 2nd iHD 9/20 CRRT initiated, GI bleed 9/22 Extubated 9/24 CRRT stopped due to clotting issues overnight, CL diet, NG clamped; failed trial 9/26 NG clamped 9/27  NG removed 9/30 MBS with SLP, started full liquids 10/1 large bloody BM with clots, transferred to ICU, downgraded to clear liquids 10/2 colonoscopy, EGD: deep, severe mucosal ulceration in the rectum and recto-sigmoid colon (suspected ischemic or infectious) 10/3 SLP follow-up, diet advanced to regular with thin liquids  Currently on room air.  No bloody stools overnight. May require HD again, nephrology is following.  I/O +13.1 L since admission UOP 1450 ml x 24 hours  Patient remains on TPN at 80 ml/h to provide 2400 kcal, 140 gm protein per day meeting 100% of estimated nutrition needs.  Labs reviewed.  CBG: [id number]  Medications reviewed and include amiodarone, IV KCl.  Diet Order:   Diet Order             Diet regular Room service appropriate? Yes with Assist; Fluid consistency: Thin  Diet effective now                   EDUCATION NEEDS:   Not appropriate for education at this time  Skin:  Skin Assessment: Skin Integrity Issues: Skin Integrity Issues:: Stage III DTI: n/a Stage II: n/a Stage III: R upper buttocks Wound Vac: n/a  Last BM:  10/2 x 2, type 7, red, brown, small  Height:   Ht Readings from Last 1 Encounters:  12/20/22 5\' 11"  (1.803 m)    Weight:   Wt Readings from Last 1 Encounters:  01/11/23 103.3 kg    BMI:  Body mass index is 31.76 kg/m.  Estimated Nutritional Needs:   Kcal:  2300-2500 kcals  Protein:  140 -150 g  Fluid:  >/= 2.3 L   Gabriel Rainwater RD, LDN, CNSC Please refer to Amion for contact information.

## 2023-01-11 NOTE — Progress Notes (Signed)
Patient ID: [name] [name], male   DOB: 1956/02/01, 67 y.o.   MRN: [id number] [facility] Progress Note  1 Day Post-Op  Subjective: CC-  Comfortable this morning. Denies abdominal pain, nausea, vomiting. Tolerating liquids. No more bloody bowel movements over night.  Hgb 6.8 from 7.1, getting 1u PRBCs today. He did not require blood transfusion yesterday.  Objective: Vital signs in last 24 hours: Temp:  [97.3 F (36.3 C)-98.1 F (36.7 C)] 97.6 F (36.4 C) (10/03 0830) Pulse Rate:  [73-90] 86 (10/03 0900) Resp:  [9-21] 15 (10/03 0900) BP: (129-176)/(58-128) 167/69 (10/03 0900) SpO2:  [93 %-100 %] 95 % (10/03 0900) Weight:  [103.3 kg] 103.3 kg (10/03 0500) Last BM Date : 01/10/23  Intake/Output from previous day: 10/02 0701 - 10/03 0700 In: 2836.6 [P.O.:720; I.V.:1965.5; IV Piggyback:151.1] Out: 1450 [Urine:1450] Intake/Output this shift: Total I/O In: 723.1 [I.V.:213.5; Blood:360.7; IV Piggyback:148.9] Out: -   PE: Gen: alert, NAD Pulm: respirations unlabored  Abd: soft, nondistended, nontender, open midline incision beefy red without drainage  Lab Results:  Recent Labs    01/10/23 2107 01/11/23 0401  WBC 10.3 9.7  HGB 7.1* 6.8*  HCT 21.4* 20.5*  PLT 181 174   BMET Recent Labs    01/10/23 0333 01/11/23 0401  NA 139 138  K 3.8 3.7  CL 114* 111  CO2 18* 18*  GLUCOSE 148* 134*  BUN 120* 130*  CREATININE 2.73* 2.95*  CALCIUM 7.3* 7.6*   PT/INR No results for input(s): "LABPROT", "INR" in the last 72 hours. CMP     Component Value Date/Time   NA 138 01/11/2023 0401   K 3.7 01/11/2023 0401   CL 111 01/11/2023 0401   CO2 18 (L) 01/11/2023 0401   GLUCOSE 134 (H) 01/11/2023 0401   BUN 130 (H) 01/11/2023 0401   CREATININE 2.95 (H) 01/11/2023 0401   CALCIUM 7.6 (L) 01/11/2023 0401   PROT 4.3 (L) 01/11/2023 0401   PROT 6.3 03/16/2020 0800   ALBUMIN 1.6 (L) 01/11/2023 0401   ALBUMIN 4.3 03/16/2020 0800   AST 50 (H) 01/11/2023 0401   ALT 96  (H) 01/11/2023 0401   ALKPHOS 69 01/11/2023 0401   BILITOT 0.5 01/11/2023 0401   BILITOT 0.8 03/16/2020 0800   GFRNONAA 22 (L) 01/11/2023 0401   GFRAA >60 04/14/2015 1127   Lipase     Component Value Date/Time   LIPASE 28 11/27/2022 0755       Studies/Results: DG Chest Port 1 View  Result Date: 01/09/2023 CLINICAL DATA:  Shortness of breath with abdominal pain for several days. History of hypertension. EXAM: PORTABLE CHEST 1 VIEW COMPARISON:  Radiographs 12/29/2022 and 12/23/2022. Chest CT 12/29/2022. FINDINGS: 1121 hours. Interval extubation and removal of the enteric tube. Right IJ central venous catheter has been removed. Left IJ central venous catheter appears unchanged, projecting to the mid SVC level. The heart size and mediastinal contours are stable. There are persistent low lung volumes. The lungs appear clear. No pleural effusion or pneumothorax. The bones appear unchanged, without acute findings. Telemetry leads overlie the chest. IMPRESSION: Interval extubation and removal of the enteric tube and right IJ central venous catheter. No acute cardiopulmonary process. Electronically Signed   By: Carey Bullocks M.D.   On: 01/09/2023 13:23    Anti-infectives: Anti-infectives (From admission, onward)    Start     Dose/Rate Route Frequency Ordered Stop   12/30/22 1800  vancomycin (VANCOCIN) IVPB 1000 mg/200 mL premix        1,000  mg 200 mL/hr over 60 Minutes Intravenous Every 24 hours 12/29/22 1844 01/03/23 1946   12/30/22 0600  vancomycin (VANCOCIN) IVPB 1000 mg/200 mL premix  Status:  Discontinued        1,000 mg 200 mL/hr over 60 Minutes Intravenous Every 12 hours 12/29/22 1842 12/29/22 1844   12/29/22 2200  meropenem (MERREM) 1 g in sodium chloride 0.9 % 100 mL IVPB  Status:  Discontinued        1 g 200 mL/hr over 30 Minutes Intravenous Every 12 hours 12/29/22 1705 12/29/22 1842   12/29/22 1930  meropenem (MERREM) 1 g in sodium chloride 0.9 % 100 mL IVPB        1 g 200  mL/hr over 30 Minutes Intravenous Every 8 hours 12/29/22 1842 01/04/23 0700   12/29/22 1800  vancomycin (VANCOREADY) IVPB 2000 mg/400 mL        2,000 mg 200 mL/hr over 120 Minutes Intravenous  Once 12/29/22 1705 12/29/22 1958   12/23/22 1230  piperacillin-tazobactam (ZOSYN) IVPB 2.25 g        2.25 g 100 mL/hr over 30 Minutes Intravenous Every 8 hours 12/23/22 1139 12/28/22 2109   12/22/22 0000  piperacillin-tazobactam (ZOSYN) IVPB 3.375 g  Status:  Discontinued        3.375 g 12.5 mL/hr over 240 Minutes Intravenous Every 8 hours 12/21/22 2331 12/23/22 1139        Assessment/Plan 67 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 Karin Lieu, exploratory laparotomy and small bowel resection Dr. Janee Morn -S/p re-exploration, replace abthera FB 12/23/22   -S/p re-exploration, small bowel resection with anastomosis x2, assessment of bowel perfusion with ICG, abdominal closure 9/16 Dr. Andrey Campanile - BRBPR s/p egd/colonoscopy 10/2 that revealed severe ulceration in the rectum and recto- sigmoid colon likely ischemic in nature, biopsy is pending. No active bleeding noted on endoscopy yesterday, and no further bleeding over night. Hgb 6.8 from 7.1 so he is getting 1u PRBCs today. Anticoagulation and antiplatelets being held. If rebleeding occurs treatment would be limited to endoscopic management as surgery in this patient would be high risk at this point and likely not an option. - Continue TPN. Ok to advance diet as tolerated, I see SLP eval pending - daily wet to dry dressing changes to midline abdominal wound   FEN: CLD, TPN VTE: ASA PR, bivalirudin on hold currently  ID: No current abx    LOS: 22 days    Franne Forts, PA-C [facility] 01/11/2023, 10:00 AM Please see Amion for pager number during day hours 7:00am-4:30pm

## 2023-01-11 NOTE — Evaluation (Signed)
Physical Therapy Evaluation Patient Details Name: [name] [name] MRN: [id number] DOB: October 10, 1955 Today's Date: 01/11/2023  History of Present Illness  Pt is a 67 y/o male admitted 9/11 with several weeks of abdominal pain along with a 30 pound weight loss. Work up included mesenteric ischemia and necrotic area of jejunum. S/p Exploratory laparotomy and small bowel resection 9/12, reopening of the laparotomy 9/14, second re-opening of laparotomy and more small bowel resection with anastomosis x2 9/16. 9/18 iHD, Extubated 9/22; 9/26 trans out of ICU; 9/29 rectal bleeding began 9/30-10/1 hypotensive and return to ICU with ongoing bleeding; 10/2 endoscopy and colonoscopy deep ulcerations encompassing 80% on the observed bowel with stigmata of recent bleeding. PMHx:testicular CA, HTN, ankle and patellar fx's, retinal detachment  Clinical Impression  Pt is more deconditioned as expected, but of a similar level of function on re-eval.  Emphasis today on lessening pt's anxiety with mobility, warm up exercise, transition to EOB, face to face assist to stand and pivot to the recliner..         If plan is discharge home, recommend the following: A lot of help with walking and/or transfers;A lot of help with bathing/dressing/bathroom;Assistance with cooking/housework;Assist for transportation   Can travel by private vehicle        Equipment Recommendations Rolling walker (2 wheels);BSC/3in1  Recommendations for Other Services  Rehab consult    Functional Status Assessment Patient has had a recent decline in their functional status and demonstrates the ability to make significant improvements in function in a reasonable and predictable amount of time.     Precautions / Restrictions Precautions Precautions: Fall Precaution Comments: anxious Restrictions Weight Bearing Restrictions: No      Mobility  Bed Mobility Overal bed mobility: Needs Assistance Bed Mobility: Rolling, Sidelying to  Sit Rolling: Min assist Sidelying to sit: Mod assist       General bed mobility comments: pt assisting with trunk and UE's after encouraging with cuing.    Transfers Overall transfer level: Needs assistance Equipment used: None Transfers: Sit to/from Stand, Bed to chair/wheelchair/BSC Sit to Stand: Mod assist, +2 safety/equipment Stand pivot transfers: Mod assist, +2 safety/equipment         General transfer comment: pt weak and anxious, with cues, pt seemed to follow, focus more and get "out of his head" a bit.  pt able to attain full upright posture before squating to reach for armrest and pivoting.    Ambulation/Gait               General Gait Details: unable today  Stairs            Wheelchair Mobility     Tilt Bed    Modified Rankin (Stroke Patients Only)       Balance Overall balance assessment: Needs assistance Sitting-balance support: Single extremity supported, Feet supported Sitting balance-Leahy Scale: Fair     Standing balance support: Single extremity supported, Bilateral upper extremity supported, During functional activity Standing balance-Leahy Scale: Poor Standing balance comment: reliant on external support                             Pertinent Vitals/Pain Pain Assessment Pain Assessment: Faces Faces Pain Scale: No hurt Pain Intervention(s): Monitored during session    Home Living                          Prior Function  Extremity/Trunk Assessment   Upper Extremity Assessment Upper Extremity Assessment: Generalized weakness (BUE edema, educated in basic movement exercises as well as elevation of limbs)    Lower Extremity Assessment Lower Extremity Assessment:  (general weakness bil gross flex 3/5, gross ext 4/5, pt notably fatigues after a few reps of ther ex U and LE's)    Cervical / Trunk Assessment Cervical / Trunk Assessment: Kyphotic  Communication       Cognition Arousal: Alert Behavior During Therapy: Anxious, Flat affect Overall Cognitive Status: Impaired/Different from baseline Area of Impairment: Following commands, Awareness, Problem solving                       Following Commands: Follows one step commands with increased time, Follows multi-step commands with increased time   Awareness: Emergent Problem Solving: Decreased initiation, Requires verbal cues General Comments: Some of the above may be due to extra anxiety        General Comments General comments (skin integrity, edema, etc.): vss    Exercises Other Exercises Other Exercises: bil hip/knee flex/ext ROM with graded assist/resistance x10 Other Exercises: bicep/tricep presses x5 reps bil with graded resistance.   Assessment/Plan    PT Assessment Patient needs continued PT services  PT Problem List Decreased strength;Decreased activity tolerance;Decreased mobility;Decreased knowledge of use of DME;Decreased knowledge of precautions;Decreased balance       PT Treatment Interventions DME instruction;Gait training;Functional mobility training;Therapeutic activities;Balance training;Patient/family education    PT Goals (Current goals can be found in the Care Plan section)  Acute Rehab PT Goals Patient Stated Goal: get back home and independent PT Goal Formulation: With patient Time For Goal Achievement: 01/25/23 Potential to Achieve Goals: Good (continuing with same goals)    Frequency Min 1X/week     Co-evaluation               AM-PAC PT "6 Clicks" Mobility  Outcome Measure Help needed turning from your back to your side while in a flat bed without using bedrails?: A Lot Help needed moving from lying on your back to sitting on the side of a flat bed without using bedrails?: A Lot Help needed moving to and from a bed to a chair (including a wheelchair)?: Total Help needed standing up from a chair using your arms (e.g., wheelchair or bedside  chair)?: Total Help needed to walk in hospital room?: Total Help needed climbing 3-5 steps with a railing? : Total 6 Click Score: 8    End of Session   Activity Tolerance: Patient tolerated treatment well Patient left: in chair;with call bell/phone within reach;with family/visitor present Nurse Communication: Mobility status PT Visit Diagnosis: Other abnormalities of gait and mobility (R26.89);Muscle weakness (generalized) (M62.81)    Time: 9629-5284 PT Time Calculation (min) (ACUTE ONLY): 25 min   Charges:   PT Evaluation $PT Re-evaluation: 1 Re-eval PT Treatments $Therapeutic Activity: 8-22 mins PT General Charges $$ ACUTE PT VISIT: 1 Visit         01/11/2023  Jacinto Halim., PT Acute Rehabilitation Services [phone]  (office)  Eliseo Gum Tashanti Dalporto 01/11/2023, 2:34 PM

## 2023-01-11 NOTE — Progress Notes (Addendum)
  Progress Note    01/11/2023 8:39 AM 1 Day Post-Op  Subjective:  no complaints this morning   Vitals:   01/11/23 0700 01/11/23 0800  BP: (!) 158/65 (!) 155/68  Pulse: 90 87  Resp: 19 16  Temp:    SpO2: 94% 97%   Physical Exam: Lungs:  non labored Extremities:  palpable L PT; R pedal pulses absent but foot is warm Abdomen:  soft Neurologic: somnolent  CBC    Component Value Date/Time   WBC 9.7 01/11/2023 0401   RBC 2.29 (L) 01/11/2023 0401   HGB 6.8 (LL) 01/11/2023 0401   HCT 20.5 (L) 01/11/2023 0401   PLT 174 01/11/2023 0401   MCV 89.5 01/11/2023 0401   MCH 29.7 01/11/2023 0401   MCHC 33.2 01/11/2023 0401   RDW 17.5 (H) 01/11/2023 0401   LYMPHSABS 0.9 10/30/2022 1654   MONOABS 0.6 10/30/2022 1654   EOSABS 0.3 10/30/2022 1654   BASOSABS 0.0 10/30/2022 1654    BMET    Component Value Date/Time   NA 138 01/11/2023 0401   K 3.7 01/11/2023 0401   CL 111 01/11/2023 0401   CO2 18 (L) 01/11/2023 0401   GLUCOSE 134 (H) 01/11/2023 0401   BUN 130 (H) 01/11/2023 0401   CREATININE 2.95 (H) 01/11/2023 0401   CALCIUM 7.6 (L) 01/11/2023 0401   GFRNONAA 22 (L) 01/11/2023 0401   GFRAA >60 04/14/2015 1127    INR    Component Value Date/Time   INR 1.7 (H) 12/29/2022 0809     Intake/Output Summary (Last 24 hours) at 01/11/2023 0839 Last data filed at 01/11/2023 0800 Gross per 24 hour  Intake 3278.59 ml  Output 1450 ml  Net 1828.59 ml     Assessment/Plan:  67 y.o. male is s/p SMA stenting and small bowel resection 1 Day Post-Op   Hemodynamically stable on exam; received another unit pRBC this morning; post transfusion labs pending Colonoscopy demonstrated a deep recto-sigmoid ulceration; continue to avoid anticoagulation per GI Vascular will continue to follow   Emilie Rutter, PA-C [facility] [phone] 01/11/2023 8:39 AM  VASCULAR STAFF ADDENDUM: I have independently interviewed and examined the patient. I agree with the above.   Waiting for pathology results of biopsies.  Patient with proctitis, concerned this is ischemic.  I spoke to general surgery.  He is not a candidate for reoperation at this time.  This would require conservative treatment, however if ischemic, likely would be a mortal diagnosis.  [name] is aware of this.   Victorino Sparrow MD [facility] Phone Number: [phone] 01/11/2023 11:25 AM

## 2023-01-11 NOTE — Progress Notes (Signed)
Patient ID: [name] [name], male   DOB: Aug 02, 1955, 67 y.o.   MRN: [id number]  Subjective:  Patient s/p colonoscopy and EGD on 10/2 with GI.  This noted deep and severe mucosal ulceration in the rectum and rectosigmoid colon (felt ischemic or infectious per GI) which was noted to not be actively bleeding on exam; also noted noted diverticulosis without active bleeding.  Exam was aborted in the descending colon per GI given lack of bowel prep and the pertinent rectal findings.  GI advised to avoid anticoagulation at this time.  His EGD noted erythematous mucosa in the gastric body which was not actively bleeding at the time of the exam.  He had 1.5 liters UOP over 10/2 as well as one unmeasured urine void.   Spoke with his RN and he did not have any bloody stools overnight - did have some yesterday.  She has just hung a unit of PRBC's.  I updated his wife via phone after I saw him.   Review of systems:    Denies chest pain  Denies n/v Denies overt shortness of breath   O:BP (!) 176/78   Pulse 82   Temp (!) 97.5 F (36.4 C) (Oral)   Resp 13   Ht 5\' 11"  (1.803 m)   Wt 103.3 kg   SpO2 98%   BMI 31.76 kg/m   Intake/Output Summary (Last 24 hours) at 01/11/2023 0647 Last data filed at 01/11/2023 8413 Gross per 24 hour  Intake 2498.19 ml  Output 1450 ml  Net 1048.19 ml   Intake/Output: I/O last 3 completed shifts: In: 6372.1 [P.O.:240; I.V.:5093.8; Blood:988.3; IV Piggyback:50] Out: 1630 [Urine:1630]  Intake/Output this shift:  Total I/O In: 1348.5 [P.O.:480; I.V.:868.5] Out: 750 [Urine:750] Weight change: 0.2 kg  General adult male in bed in no acute distress     HEENT normocephalic atraumatic extraocular movements intact sclera anicteric Neck supple trachea midline Lungs clear to auscultation bilaterally normal work of breathing at rest on room air Heart S1S2 no rub Abdomen soft nontender midline abd incision Extremities 1+ edema lower extremities; 2+ edema upper extremities Psych  normal mood and affect Neuro - awake but tired. interactive; provides limited history - his wife supplements  Access no HD access   Recent Labs  Lab 01/05/23 0515 01/05/23 1253 01/06/23 0620 01/06/23 1809 01/07/23 0545 01/08/23 0500 01/09/23 0452 01/09/23 1132 01/10/23 0333 01/11/23 0401  NA 153*   < > 149* 147* 143 136 142 142 139 138  K 3.0*   < > 3.3* 4.0 4.3 3.8 4.4 4.1 3.8 3.7  CL 121*   < > 120* 116* 113* 113* 117*  --  114* 111  CO2 22   < > 20* 17* 17* 16* 12*  --  18* 18*  GLUCOSE 135*   < > 171* 188* 174* 214* 156*  --  148* 134*  BUN 116*   < > 104* 100* 97* 102* 119*  --  120* 130*  CREATININE 2.79*   < > 2.44* 2.30* 2.34* 2.24* 2.77*  --  2.73* 2.95*  ALBUMIN 1.9*  --   --   --  1.8* 1.6* 1.6*  --  1.5* 1.6*  CALCIUM 8.0*   < > 7.8* 7.9* 7.7* 7.4* 7.6*  --  7.3* 7.6*  PHOS 3.9  --  3.4  --  3.4 3.7 4.6  --  4.0  --   AST  --   --   --   --  155* 79*  --   --   --  50*  ALT  --   --   --   --  305* 198*  --   --   --  96*   < > = values in this interval not displayed.   Liver Function Tests: Recent Labs  Lab 01/07/23 0545 01/08/23 0500 01/09/23 0452 01/10/23 0333 01/11/23 0401  AST 155* 79*  --   --  50*  ALT 305* 198*  --   --  96*  ALKPHOS 128* 96  --   --  69  BILITOT 0.5 0.6  --   --  0.5  PROT 4.7* 4.3*  --   --  4.3*  ALBUMIN 1.8* 1.6* 1.6* 1.5* 1.6*   CBC: Recent Labs  Lab 01/09/23 2116 01/10/23 0333 01/10/23 1417 01/10/23 2107 01/11/23 0401  WBC 14.4* 11.5* 11.1* 10.3 9.7  HGB 8.2* 7.4* 7.4* 7.1* 6.8*  HCT 24.0* 21.5* 22.3* 21.4* 20.5*  MCV 88.6 89.2 89.2 89.9 89.5  PLT 161 158 182 181 174   Cardiac Enzymes: No results for input(s): "CKTOTAL", "CKMB", "CKMBINDEX", "TROPONINI" in the last 168 hours. CBG: Recent Labs  Lab 01/10/23 0757 01/10/23 1710 01/10/23 2016 01/10/23 2359 01/11/23 0358  GLUCAP 115* 133* 133* 136* 132*    Studies/Results: DG Chest Port 1 View  Result Date: 01/09/2023 CLINICAL DATA:  Shortness of breath  with abdominal pain for several days. History of hypertension. EXAM: PORTABLE CHEST 1 VIEW COMPARISON:  Radiographs 12/29/2022 and 12/23/2022. Chest CT 12/29/2022. FINDINGS: 1121 hours. Interval extubation and removal of the enteric tube. Right IJ central venous catheter has been removed. Left IJ central venous catheter appears unchanged, projecting to the mid SVC level. The heart size and mediastinal contours are stable. There are persistent low lung volumes. The lungs appear clear. No pleural effusion or pneumothorax. The bones appear unchanged, without acute findings. Telemetry leads overlie the chest. IMPRESSION: Interval extubation and removal of the enteric tube and right IJ central venous catheter. No acute cardiopulmonary process. Electronically Signed   By: Carey Bullocks M.D.   On: 01/09/2023 13:23    sodium chloride   Intravenous Once   sodium chloride   Intravenous Once   Chlorhexidine Gluconate Cloth  6 each Topical Daily   Gerhardt's butt cream   Topical Daily   pantoprazole (PROTONIX) IV  40 mg Intravenous Q12H   sodium chloride flush  3 mL Intravenous Q12H    BMET    Component Value Date/Time   NA 138 01/11/2023 0401   K 3.7 01/11/2023 0401   CL 111 01/11/2023 0401   CO2 18 (L) 01/11/2023 0401   GLUCOSE 134 (H) 01/11/2023 0401   BUN 130 (H) 01/11/2023 0401   CREATININE 2.95 (H) 01/11/2023 0401   CALCIUM 7.6 (L) 01/11/2023 0401   GFRNONAA 22 (L) 01/11/2023 0401   GFRAA >60 04/14/2015 1127   CBC    Component Value Date/Time   WBC 9.7 01/11/2023 0401   RBC 2.29 (L) 01/11/2023 0401   HGB 6.8 (LL) 01/11/2023 0401   HCT 20.5 (L) 01/11/2023 0401   PLT 174 01/11/2023 0401   MCV 89.5 01/11/2023 0401   MCH 29.7 01/11/2023 0401   MCHC 33.2 01/11/2023 0401   RDW 17.5 (H) 01/11/2023 0401   LYMPHSABS 0.9 10/30/2022 1654   MONOABS 0.6 10/30/2022 1654   EOSABS 0.3 10/30/2022 1654   BASOSABS 0.0 10/30/2022 1654      Assessment/Plan:   Acute kidney injury on CKD IIIb:  Baseline creatinine level around 2.0.  Acute kidney injury  likely ischemic ATN due to ex lap surgery/IV contrast/hypotension  Started on HD 9/18 due to inability to wean from vent and worsening renal function.  He was then switched to CRRT on 9/20-24.  Urine output now improving. I have offered renal replacement therapy and he has had in the past and would want this again if needed.  Critical care is prioritizing stabilizing his GI bleed first.  Will speak with critical care this AM.  Elevation in BUN felt 2/2 to his GI bleed     Agree with not intervening on right RAS given that his right kidney is already much smaller than anticipated, discussed with VVS at that time. Likely not much function being contributed from right.   Continue supportive care  Strict ins and outs and daily weights Note that if contrast is clinically indicated urgently or emergently would then proceed with contrast  Acute mesenteric ischemia: Status post small bowel resection and superior mesenteric artery stent placement.  Followed by vascular surgeon and currently on agatroban since there was inadequate response to heparin. S/p OR again 9/16 for exlap and further small bowel resection, abd closed. S/p CTA 9/20 without any emergent findings Hypernatremia: resolved Metabolic acidosis - s/p bicarb gtt  Vent -dependent respiratory failure: Now extubated.  On room air and monitoring volume and resp status closely given large amount of inputs required.   Anemia due to acute blood loss: transfuse prn for hgb <7. PRBC's per primary team. Multiple transfusions on 10/1.  S/p EGD and colonoscopy on 10/2.  Acute GI bleed - s/p PRBC's and GI is following.  S/p EGD and colonoscopy as above on 10/2 with severe mucosal ulceration in the rectum and rectosigmoid colon (felt ischemic or infectious per GI) Hypertension - shock resolved.      Severe protein malnutrition - on TPN per primary team Transaminitis - may have been setting of shock -  improving   Disposition - continue inpatient monitoring  Estanislado Emms, MD 7:11 AM 01/11/2023

## 2023-01-11 NOTE — Progress Notes (Signed)
NAME:  [name] [name], MRN:  [id number], DOB:  23-Aug-1955, LOS: 22 ADMISSION DATE:  12/20/2022, CONSULTATION DATE: 9/12 REFERRING MD: Dr. Jacqulyn Bath, CHIEF COMPLAINT: Mesenteric ischemia  History of Present Illness:   67 year old male with past medical history as below, which is significant for intestinal stromal tumor removal in 2022, known mesenteric stenosis, GERD, hypertension, and hyperlipidemia.  In the few weeks prior to presentation on 9/11 he had been evaluated at several campuses for abdominal pain with noncontrasted CTs of the abdomen which were unrevealing.  He continued to have abdominal pain, weight loss, and poor p.o. intake.  He presented [name] on 9/11 and was admitted to the hospitalist for abdominal pain with concern for superior mesenteric artery thrombus.  He was started on heparin infusion.  Vascular surgery was consulted and ultimately a CT angiogram of the abdomen and pelvis was done 9/12 demonstrating acute occlusion of the distal SMA as well as occlusion of the inferior mesenteric artery.  He was taken to the operating room emergently and and was found to have an necrotic area of the jejunum.  This was resected, and SMA stent was placed, and the patient was left with an open abdomen.  He also remained on the mechanical ventilator postoperatively and PCCM was asked to admit for ventilator management.  Pertinent  Medical History   has a past medical history of Ankle fracture, Cancer (HCC) (1974), Cataract, GERD (gastroesophageal reflux disease), colonic polyps, Hyperlipidemia, Hypertension, Kidney stones, Patella fracture, Retinal detachment, Retinal tear of right eye, and Syncopal episodes (2008).   Significant Hospital Events: Including procedures, antibiotic start and stop dates in addition to other pertinent events   9/11 admitted with abdominal pain 9/12 CT demonstrating mesenteric occlusion, bowel resection and stent placed, to ICU on vent. Left in discontinuity  9/14  back to OR. Left in discontinuity as still concern about bowel integrity 9/16 back to OR  s sig areas of ischemia w/ 2 areas of perforation, another 66cm section of jejunum was resected and area of discontinuity was reanastomosed. Changed to argatroban post-op as unable to get heparin levels detectable  9/17 dropping RASS goal to 0 to -1, assessing for readiness to wean, serum creatinine and BUN still climbing some but making urine 9/18 iHD  9/20 rising pressor need, anemia: CTA abd: nothing emergent, transfused 9/22 extubated 9/25 poor tolerance of NGT clamping. Abx ended  9/26 trying clamping again. Adding PRN antihypertensives. Temp incr a bit, 100.6. holding off on HD and tunneled HD cath given incr UOP  9/26 transferred out of ICU 9/29 Argatroban stopped and held due to rectal bleeding 9/30 Hgb 6.1 at 1250, 2u PRBC ordered then had large bloody BM again overnight, repeat H/H pending. Was hypotensive initially to 70-80s but responded to 200cc IVF. Likely needs more blood, TRH to follow.  9/30 PCCM asked to see incase has ongoing bleeding and hypotension and needs ICU care again 10/1 ICU transfer for GI bleed. AC held.  10/2 endoscopy > no bleedin source identified 10/2 colonoscopy > deep ulcerations encompassing 80% on the observed bowel with stigmata of recent bleeding.   Interim History / Subjective:  BRBPR has slowed Hgb down to 6.8. 1 unit PRBC transfusing BP stable Some orthostasis when dangling feet at side of bed. SBP 150 > 90. Improved with time. Subjective dizziness during the event.    Objective   Blood pressure (!) 167/69, pulse 86, temperature 97.6 F (36.4 C), temperature source Oral, resp. rate 15, height 5\' 11"  (1.803  m), weight 103.3 kg, SpO2 95%.        Intake/Output Summary (Last 24 hours) at 01/11/2023 1001 Last data filed at 01/11/2023 0900 Gross per 24 hour  Intake 3442.88 ml  Output 1450 ml  Net 1992.88 ml   Filed Weights   01/09/23 0428 01/10/23 0459  01/11/23 0500  Weight: 102.1 kg 103.1 kg 103.3 kg    Examination: Pale Middle aged male in NAD Clear bilateral breath sounds RRR, no MRG Soft, NT, ND Hyperactive Alert, oriented, non-focal Global anasarca  Hemoglobin down to 6.8 BUN 130 Creat 2.95  Assessment & Plan:   Acute mesenteric ischemia -s/p thrombectomy and SMA stent placement with ex lap and SB resection 9/12 followed by re-exploration x 2 9/14 and 9/16.  Completed course of vanc/mero on 9/25. Per vascular surgery the majority of his bowel is surviving off a 3.59mm stent. - management per vascular surgery - avoid hypotension - AC when able from a GIB standpoint.   Postoperative GIB, ABLA ongoing Colonoscopy 10/2 showeing deeps ulcerations in majority of the colon with evidence of bleeding likely due to ischemia.  - 1 unit PRBC given this morning.  - repeat hgb q 8 hours - AC on hold - Avoid coagulopathy, hypothermia, acidemia   Afib now NSR -  on Amio - Tele  HTN HLD - home therapies on hold.   AKI on CKD 3, acidemia - nephro following - May need further HD - trend UOP and chemistry - GIB bleed contributing to BUN  Physical deconditioning  - PT/OT when able  Nutrition - TPN - NPO - Surgery following  Transfer out of ICU  Best Practice (right click and "Reselect all SmartList Selections" daily)   Diet/type: TPN DVT prophylaxis: argatroban on hold GI prophylaxis: PPI Lines: Central line Foley:  Yes, and it is still needed Code Status:  full code Last date of multidisciplinary goals of care discussion [updated wife at bedside 9/22]    Joneen Roach, AGACNP-BC [facility]  See Amion for personal pager PCCM on call pager [phone] until 7pm. Please call Elink 7p-7a. [phone]  01/11/2023 10:01 AM

## 2023-01-11 NOTE — Progress Notes (Addendum)
Attending physician's note   I have taken a history, reviewed the chart, and examined the patient. I performed a substantive portion of this encounter, including complete performance of at least one of the key components, in conjunction with the APP. I agree with the APP's note, impression, and recommendations with my edits.   Discussed biopsy results with the Pathologist.  Ulceration with inflammatory changes consistent with ischemia and no viral cytopathic effect.  Will perform additional staining for HSV and CMV, but based on overall clinical presentation, ischemia highest likelihood for severe inflammatory change noted on sigmoidoscopy.  Continue with max supportive care as currently doing.  Receiving another unit PRBCs today.  Continue trending CBCs with additional blood products as needed per protocol.  Ann Held, [employer] [phone] office         Daily Progress Note  DOA: 12/20/2022 Hospital Day: 5  Chief Complaint:   ASSESSMENT    Brief Narrative:  [name] [name] is a 67 y.o. year old male with a history of hypertension, hyperlipidemia, obesity and a complicated GI history including GERD, GIST resection in 2023, prior GI bleeds with duodenal ulcers, intestinal angioectasis, diverticular hemorrhage, gastroduodenitis. Admitted with small bowel ischemia. GI evaluated 9/30 for hematochezia   Acute mesenteric ischemia 2/2 SMA stenosis / thrombosis . He is s/p SMA thrombectomy and stenting followed by exploratory laparotomy and small bowel resection then back to OR for resection of necrotic jejunum.     GI bleeding 2/2 to severely ulcerated colon seen on sigmoidoscopy yesterday. Path c/w ischemia but we have asked Pathology to perform CMV, HSV stains.   No further bleeding, today or yesterday despite minor drop in hgb overnight to 6.8. Received a unit of blood today with bump in hgb to 8.6. Anticoagulation on hold   Afib, on Amio   PLAN   Awaiting biopsies stains  but suspect all ischemic.    Subjective   Not having much abdominal pain today. Ate a few bites of pudding. No blood in stool yesterday. No BMs today   Objective    Recent Labs    01/10/23 1417 01/10/23 2107 01/11/23 0401 01/11/23 1228  WBC 11.1* 10.3 9.7  --   HGB 7.4* 7.1* 6.8* 8.6*  HCT 22.3* 21.4* 20.5* 25.8*  PLT 182 181 174  --    BMET Recent Labs    01/09/23 0452 01/09/23 1132 01/10/23 0333 01/11/23 0401  NA 142 142 139 138  K 4.4 4.1 3.8 3.7  CL 117*  --  114* 111  CO2 12*  --  18* 18*  GLUCOSE 156*  --  148* 134*  BUN 119*  --  120* 130*  CREATININE 2.77*  --  2.73* 2.95*  CALCIUM 7.6*  --  7.3* 7.6*   LFT Recent Labs    01/11/23 0401  PROT 4.3*  ALBUMIN 1.6*  AST 50*  ALT 96*  ALKPHOS 69  BILITOT 0.5   PT/INR No results for input(s): "LABPROT", "INR" in the last 72 hours.   Imaging:  DG Chest Port 1 View CLINICAL DATA:  Shortness of breath with abdominal pain for several days. History of hypertension.  EXAM: PORTABLE CHEST 1 VIEW  COMPARISON:  Radiographs 12/29/2022 and 12/23/2022. Chest CT 12/29/2022.  FINDINGS: 1121 hours. Interval extubation and removal of the enteric tube. Right IJ central venous catheter has been removed. Left IJ central venous catheter appears unchanged, projecting to the mid SVC level.  The heart size and mediastinal contours are stable.  There are persistent low lung volumes. The lungs appear clear. No pleural effusion or pneumothorax. The bones appear unchanged, without acute findings. Telemetry leads overlie the chest.  IMPRESSION: Interval extubation and removal of the enteric tube and right IJ central venous catheter. No acute cardiopulmonary process.  Electronically Signed   By: Carey Bullocks M.D.   On: 01/09/2023 13:23     Scheduled inpatient medications:   sodium chloride   Intravenous Once   sodium chloride   Intravenous Once   Chlorhexidine Gluconate Cloth  6 each Topical Daily    feeding supplement  237 mL Oral TID BM   Gerhardt's butt cream   Topical Daily   pantoprazole (PROTONIX) IV  40 mg Intravenous Q12H   sodium chloride flush  3 mL Intravenous Q12H   Continuous inpatient infusions:   albumin human Stopped (01/01/23 1020)   amiodarone 30 mg/hr (01/11/23 1400)   anticoagulant sodium citrate     TPN ADULT (ION) 80 mL/hr at 01/11/23 1400   TPN ADULT (ION)     PRN inpatient medications: albumin human, alteplase, anticoagulant sodium citrate, bisacodyl, haloperidol lactate, heparin, heparin, hydrALAZINE, lidocaine (PF), lidocaine-prilocaine, mouth rinse, oxyCODONE, pentafluoroprop-tetrafluoroeth  Vital signs in last 24 hours: Temp:  [97.5 F (36.4 C)-98.1 F (36.7 C)] 97.6 F (36.4 C) (10/03 0830) Pulse Rate:  [73-90] 86 (10/03 1400) Resp:  [9-21] 15 (10/03 1400) BP: (119-176)/(58-128) 150/69 (10/03 1300) SpO2:  [93 %-99 %] 97 % (10/03 1400) Weight:  [103.3 kg] 103.3 kg (10/03 0500) Last BM Date : 01/10/23  Intake/Output Summary (Last 24 hours) at 01/11/2023 1522 Last data filed at 01/11/2023 1400 Gross per 24 hour  Intake 3225.24 ml  Output 1450 ml  Net 1775.24 ml    Intake/Output from previous day: 10/02 0701 - 10/03 0700 In: 2836.6 [P.O.:720; I.V.:1965.5; IV Piggyback:151.1] Out: 1450 [Urine:1450] Intake/Output this shift: Total I/O In: 1304.2 [I.V.:694.7; Blood:360.7; IV Piggyback:248.9] Out: -    Physical Exam:  General: Alert male in NAD Heart:  Regular rate and rhythm.  Pulmonary: Normal respiratory effort Abdomen: Soft, nondistended, nontender. Dry abdominal dressing. A few bowel sounds.  Neurologic: Alert and oriented Psych: Pleasant. Cooperative. Insight appears normal.    Principal Problem:   Superior mesenteric artery stenosis (HCC) Active Problems:   Hyperlipidemia   Essential hypertension   Hypoalbuminemia   GERD (gastroesophageal reflux disease)   Acute renal failure superimposed on stage 3a chronic kidney disease  (HCC)   Mesenteric ischemia (HCC)   Endotracheal tube present   Protein-calorie malnutrition, severe   Acute respiratory failure (HCC)   Physical deconditioning   Hematochezia   Colon ulcer     LOS: 22 days   Willette Cluster ,NP 01/11/2023, 3:22 PM

## 2023-01-11 NOTE — Progress Notes (Signed)
PHARMACY - TOTAL PARENTERAL NUTRITION CONSULT NOTE  Indication:  SMA syndrome w associated weight loss  Patient Measurements: Height: 5\' 11"  (180.3 cm) Weight: 103.3 kg (227 lb 11.8 oz) IBW/kg (Calculated) : 75.3 TPN AdjBW (KG): 80.4 Body mass index is 31.76 kg/m. Usual Weight: 95 kg, has lost 50 lbs recently   Assessment:  67 yo M admitted with abdominal pain, weight loss, and poor PO intake. 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 He is now s/p SMA stent placement. He was initially left is discontinuity with open abdomen and was closed on 9/16. TPN was initiated 9/14.   Pt evaluated by speech therapy 9/29 - was able to tolerate trials of ice chips, thin liquid, and puree w/o overt signs or aspiration or difficulty swallowing.  MBS completed 9/30 - cognitive based oral dysphagia; recommended for trial of full liquids and upgrade as mentation allows. Requires full assistance for feeding until mentation improves.   Glucose / Insulin: A1c 6.3% - CBGs < 140; off insulin Electrolytes: K 3.7 (ordered 40 mEq IV); CO2 18 (max acetate in TPN), CoCa 9.5, phos added but unable to run in time, Others wnl  Renal: off CRRT 9/24, possible iHD soon. SCr 2.95, BUN 130 (possibly high due to GIB though more specific to upper GIB) Hepatic: alk phos/tbili wnl, AST down 50, ALT down 96, albumin 1.6, TG 88 (wnl) Intake / Output; MIVF: UOP 0.6 ml/kg/hr, LBM 10/2 x2. RBC 360 ml with ongoing bloody BM 9/30-10/2 (s/p DDAVP) GI Imaging:  9/20 CTA: improved ostial stenosis post stenting, thrombus/occlusion of SMA/ileocolic artery improving, surgical site has increased dilatation, L lung nodule GI Surgeries / Procedures:  9/14 re-ex lap, left in discontinuity with plans for re-exploration  9/16 re-ex lap, SBR with anastomosis x2, abdominal closure 10/2 EGD: gastric erythematous mucosa, no active bleeding  10/2 Colo: Deep and severe mucosal  ulceration in the rectum and in the recto-sigmoid colon, likely ischemic or infectious, not actively bleeding; Exam aborted in the descending colon  Central access: CVC LIJ 12/23/22 TPN start date: 12/23/22  Nutritional Goals: Goal concentrated TPN rate is 80 mL/hr (provides 140 g AA and 2400 kCal per day) Goal un-concentrated TPN is 100 mL/hr (provides 140g AA and 2253 kCal per day)  RD Estimated Needs Total Energy Estimated Needs: 2300-2500 kcals Total Protein Estimated Needs: 140 -150 g Total Fluid Estimated Needs: >/= 2.3 L  Current Nutrition:  TPN 10/2 CLD  10/3 Regular diet   Plan:  Continue concentrated TPN at goal rate of 80 ml/hr to meet 100% of needs Electrolytes in TPN: increase Na 50 mEq/L (add 9/30 given Na trend), increase K 25 mEq/L, Ca 5 mEq/L, Mg 7 mEq/L, Phos 0 mmol/L, Cl:Ac - max acetate.  Add standard MVI and trace elements to TPN Monitor TPN labs on Mon/Thurs, daily renal function panel per MD  Additional IVF per nephrology, f/u if planning to start HD  F/u PO intake and wean TPN as able   Alphia Moh, PharmD, BCPS, [facility] Clinical Pharmacist  Please check AMION for all [facility] Pharmacy phone numbers After 10:00 PM, call Main Pharmacy [phone]

## 2023-01-11 NOTE — Progress Notes (Signed)
eLink Physician-Brief Progress Note Patient Name: [name] [name] DOB: [birth year] MRN: [id number]   Date of Service  01/11/2023  HPI/Events of Note  66 year old male with complicated past medical and gastrointestinal history including history of malignant small bowel GIST with resection and 2023, prior GI bleeds with history of duodenal ulcers and intestinal angioectasias, diverticulosis with previous hemorrhage, history of gastritis and duodenitis.  Negative nuclear medicine bleeding scan.  Colonoscopy consistent with colonic ulceration.  Hemoglobin 6.8.  K3.7.  eICU Interventions  1 unit PRBC ordered  KCl ordered.     Intervention Category Intermediate Interventions: Bleeding - evaluation and treatment with blood products  Moyinoluwa Dawe 01/11/2023, 4:54 AM

## 2023-01-11 NOTE — Progress Notes (Signed)
Occupational Therapy Re-Evaluation Patient Details Name: [name] [name] MRN: [id number] DOB: 20-Oct-1955 Today's Date: 01/11/2023   History of present illness Pt is a 67 y/o male admitted 9/11 with several weeks of abdominal pain along with a 30 pound weight loss. Work up included mesenteric ischemia and necrotic area of jejunum. S/p Exploratory laparotomy and small bowel resection 9/12, reopening of the laparotomy 9/14, second re-opening of laparotomy and more small bowel resection with anastomosis x2 9/16. 9/18 iHD, Extubated 9/22; 9/26 trans out of ICU; 9/29 rectal bleeding began 9/30-10/1 hypotensive and return to ICU with ongoing bleeding; 10/2 endoscopy and colonoscopy deep ulcerations encompassing 80% on the observed bowel with stigmata of recent bleeding. PMHx:testicular CA, HTN, ankle and patellar fx's, retinal detachment   OT comments  Pt re-evaluated, presenting with 6/10 pain (from lower back), anxious, but willing to work with therapy. Pt was mod A +2 safety for bed mobility, progressing to min guard for rolling at end of session. Pt max A for LB ADL at this time, able to sit EOB for approx 10 min mod A for back washing, declining oral care or grooming tasks at this time. Pt systolic BP initially 158 dropped to 92 with sitting, and then after 5 min sitting EOB return to 112 (able to sit min guard another 5 min for activity tolerance). Pt symptomatic for dizziness which improved after prolonged sitting - but anxiety limiting transfer to chair this session. Placed in chair position in ICU at end of session. OT will continue to follow acutely and >3 hours daily therapy continues to be appropriate POC at this time. Next session plan for OOB activity with ADL focus.       If plan is discharge home, recommend the following:  Assist for transportation;Assistance with cooking/housework;Two people to help with walking and/or transfers;A lot of help with bathing/dressing/bathroom   Equipment  Recommendations  Other (comment) (defer to next venue of care)    Recommendations for Other Services Rehab consult    Precautions / Restrictions Precautions Precautions: Fall Precaution Comments: anxious Restrictions Weight Bearing Restrictions: No       Mobility Bed Mobility Overal bed mobility: Needs Assistance Bed Mobility: Rolling, Sidelying to Sit, Sit to Supine Rolling: Contact guard assist, Min assist (initially required cues for sequencing, min guard by end of session) Sidelying to sit: Mod assist, +2 for safety/equipment, Used rails   Sit to supine: Mod assist, +2 for physical assistance, +2 for safety/equipment (assist for trunk control and BLE)   General bed mobility comments: cues for technique and initiation, truncal elevation assist and more "helicopter technique" for return supine    Transfers                   General transfer comment: NT this session due to BP and anxiety, plan on OOB next session     Balance Overall balance assessment: Needs assistance Sitting-balance support: Single extremity supported, Feet supported Sitting balance-Leahy Scale: Fair                                     ADL either performed or assessed with clinical judgement   ADL Overall ADL's : Needs assistance/impaired Eating/Feeding: Modified independent Eating/Feeding Details (indicate cue type and reason): liquid diet. able to hold cup and drink Grooming: Set up;Sitting   Upper Body Bathing: Moderate assistance;Sitting Upper Body Bathing Details (indicate cue type and reason): for back  Lower Body Dressing: Maximal assistance;Bed level Lower Body Dressing Details (indicate cue type and reason): donning socks             Functional mobility during ADLs:  (NT this session) General ADL Comments: limited participation in functional ADL due to BP and anxiety    Extremity/Trunk Assessment Upper Extremity Assessment Upper Extremity  Assessment: Generalized weakness (BUE edema, educated in basic movement exercises as well as elevation of limbs)   Lower Extremity Assessment Lower Extremity Assessment: Defer to PT evaluation        Vision       Perception     Praxis      Cognition Arousal: Alert Behavior During Therapy: Anxious, Flat affect Overall Cognitive Status: Impaired/Different from baseline Area of Impairment: Following commands, Awareness, Problem solving                       Following Commands: Follows one step commands with increased time, Follows multi-step commands with increased time (limited more by anxiety)   Awareness: Emergent Problem Solving: Decreased initiation, Requires verbal cues General Comments: Pt limited by anxiety today - will continue to assess fear of attempting vs lack of awareness as therapy continues        Exercises Exercises: Other exercises Other Exercises Other Exercises: BUE hand, wrist, elbow, shoulder basic ROM to address edema    Shoulder Instructions       General Comments Pt systolic BP initially 158 dropped to 92 with sitting, and then after 5 min sitting EOB return to 112. Pt symptomatic for dizziness which improved after prolonged sitting - but anxiety limiting transfer to chair this session. Placed in chair position in ICU at end of session.    Pertinent Vitals/ Pain       Pain Assessment Pain Assessment: 0-10 Pain Score: 6  Pain Location: lower back Pain Descriptors / Indicators: Grimacing, Sore Pain Intervention(s): Limited activity within patient's tolerance, Monitored during session, Patient requesting pain meds-RN notified (coordinated with RN on medication times)  Home Living                                          Prior Functioning/Environment              Frequency  Min 1X/week        Progress Toward Goals  OT Goals(current goals can now be found in the care plan section)  Progress towards OT  goals: Goals updated (re-evaluation)  Acute Rehab OT Goals Patient Stated Goal: return home and play tennis OT Goal Formulation: With patient Time For Goal Achievement: 01/25/23 Potential to Achieve Goals: Good  Plan      Co-evaluation                 AM-PAC OT "6 Clicks" Daily Activity     Outcome Measure   Help from another person eating meals?: A Little Help from another person taking care of personal grooming?: A Little Help from another person toileting, which includes using toliet, bedpan, or urinal?: A Lot Help from another person bathing (including washing, rinsing, drying)?: A Lot Help from another person to put on and taking off regular upper body clothing?: A Lot Help from another person to put on and taking off regular lower body clothing?: A Lot 6 Click Score: 14    End of Session    OT  Visit Diagnosis: Unsteadiness on feet (R26.81);Muscle weakness (generalized) (M62.81)   Activity Tolerance Patient tolerated treatment well (impacted by anxiety)   Patient Left in bed;with call bell/phone within reach;with bed alarm set;Other (comment) (CCNP in room, bed in chair position)   Nurse Communication Mobility status (BP)        Time: 2725-3664 OT Time Calculation (min): 32 min  Charges: OT General Charges $OT Visit: 1 Visit OT Evaluation $OT Re-eval: 1 Re-eval OT Treatments $Therapeutic Activity: 8-22 mins  Nyoka Cowden OTR/L Acute Rehabilitation Services Office: [phone]   Evern Bio [facility] 01/11/2023, 12:47 PM

## 2023-01-12 ENCOUNTER — Inpatient Hospital Stay (HOSPITAL_COMMUNITY): Payer: Medicare Other

## 2023-01-12 DIAGNOSIS — K633 Ulcer of intestine: Secondary | ICD-10-CM | POA: Diagnosis not present

## 2023-01-12 DIAGNOSIS — K551 Chronic vascular disorders of intestine: Secondary | ICD-10-CM | POA: Diagnosis not present

## 2023-01-12 DIAGNOSIS — K559 Vascular disorder of intestine, unspecified: Secondary | ICD-10-CM | POA: Diagnosis not present

## 2023-01-12 HISTORY — PX: IR FLUORO GUIDE CV LINE RIGHT: IMG2283

## 2023-01-12 HISTORY — PX: IR US GUIDE VASC ACCESS RIGHT: IMG2390

## 2023-01-12 LAB — TYPE AND SCREEN
ABO/RH(D): A POS
Antibody Screen: NEGATIVE
Unit division: 0
Unit division: 0
Unit division: 0
Unit division: 0
Unit division: 0
Unit division: 0

## 2023-01-12 LAB — BPAM RBC
Blood Product Expiration Date: 202410212359
Blood Product Expiration Date: 202410262359
Blood Product Expiration Date: 202410262359
Blood Product Expiration Date: 202410282359
Blood Product Expiration Date: 202410292359
Blood Product Expiration Date: 202410292359
ISSUE DATE / TIME: 202409301516
ISSUE DATE / TIME: 202409302039
ISSUE DATE / TIME: 202410010842
ISSUE DATE / TIME: 202410011256
ISSUE DATE / TIME: 202410011727
ISSUE DATE / TIME: 202410030510
Unit Type and Rh: 6200
Unit Type and Rh: 6200
Unit Type and Rh: 6200
Unit Type and Rh: 6200
Unit Type and Rh: 6200
Unit Type and Rh: 6200

## 2023-01-12 LAB — GLUCOSE, CAPILLARY
Glucose-Capillary: 114 mg/dL — ABNORMAL HIGH (ref 70–99)
Glucose-Capillary: 119 mg/dL — ABNORMAL HIGH (ref 70–99)
Glucose-Capillary: 126 mg/dL — ABNORMAL HIGH (ref 70–99)
Glucose-Capillary: 128 mg/dL — ABNORMAL HIGH (ref 70–99)
Glucose-Capillary: 135 mg/dL — ABNORMAL HIGH (ref 70–99)

## 2023-01-12 LAB — CBC
HCT: 22.2 % — ABNORMAL LOW (ref 39.0–52.0)
HCT: 22.4 % — ABNORMAL LOW (ref 39.0–52.0)
Hemoglobin: 7.4 g/dL — ABNORMAL LOW (ref 13.0–17.0)
Hemoglobin: 7.3 g/dL — ABNORMAL LOW (ref 13.0–17.0)
MCH: 30 pg (ref 26.0–34.0)
MCH: 30 pg (ref 26.0–34.0)
MCHC: 33.3 g/dL (ref 30.0–36.0)
MCHC: 32.6 g/dL (ref 30.0–36.0)
MCV: 89.9 fL (ref 80.0–100.0)
MCV: 92.2 fL (ref 80.0–100.0)
Platelets: 188 10*3/uL (ref 150–400)
Platelets: 190 10*3/uL (ref 150–400)
RBC: 2.47 MIL/uL — ABNORMAL LOW (ref 4.22–5.81)
RBC: 2.43 MIL/uL — ABNORMAL LOW (ref 4.22–5.81)
RDW: 17.1 % — ABNORMAL HIGH (ref 11.5–15.5)
RDW: 17.2 % — ABNORMAL HIGH (ref 11.5–15.5)
WBC: 8.7 10*3/uL (ref 4.0–10.5)
WBC: 8.5 10*3/uL (ref 4.0–10.5)
nRBC: 0 % (ref 0.0–0.2)
nRBC: 0 % (ref 0.0–0.2)

## 2023-01-12 LAB — BASIC METABOLIC PANEL
Anion gap: 13 (ref 5–15)
BUN: 147 mg/dL — ABNORMAL HIGH (ref 8–23)
CO2: 17 mmol/L — ABNORMAL LOW (ref 22–32)
Calcium: 8 mg/dL — ABNORMAL LOW (ref 8.9–10.3)
Chloride: 105 mmol/L (ref 98–111)
Creatinine, Ser: 3.51 mg/dL — ABNORMAL HIGH (ref 0.61–1.24)
GFR, Estimated: 18 mL/min — ABNORMAL LOW (ref >60)
Glucose, Bld: 125 mg/dL — ABNORMAL HIGH (ref 70–99)
Potassium: 4.5 mmol/L (ref 3.5–5.1)
Sodium: 135 mmol/L (ref 135–145)

## 2023-01-12 LAB — MAGNESIUM: Magnesium: 2.3 mg/dL (ref 1.7–2.4)

## 2023-01-12 MED ORDER — LIDOCAINE HCL 1 % IJ SOLN
INTRAMUSCULAR | Status: AC
  Filled 2023-01-12: qty 20

## 2023-01-12 MED ORDER — HEPARIN SODIUM (PORCINE) 1000 UNIT/ML IJ SOLN: 2600.0000 [IU] | Freq: Once | INTRAMUSCULAR | Status: AC

## 2023-01-12 MED ORDER — CHLORHEXIDINE GLUCONATE CLOTH 2 % EX PADS
6.0000 | MEDICATED_PAD | Freq: Every day | CUTANEOUS | Status: DC
  Administered 2023-01-13 – 2023-01-14 (×2): 6 via TOPICAL

## 2023-01-12 MED ORDER — LIDOCAINE HCL (PF) 1 % IJ SOLN: 10.0000 mL | Freq: Once | INTRAMUSCULAR | Status: AC

## 2023-01-12 MED ORDER — HEPARIN SODIUM (PORCINE) 1000 UNIT/ML IJ SOLN
INTRAMUSCULAR | Status: AC
  Filled 2023-01-12: qty 10

## 2023-01-12 MED ORDER — TRACE MINERALS CU-MN-SE-ZN 300-55-60-3000 MCG/ML IV SOLN
INTRAVENOUS | Status: AC
  Filled 2023-01-12: qty 934.4

## 2023-01-12 NOTE — Progress Notes (Signed)
PROGRESS NOTE    [name] [name]  MVH:[id number] DOB: 1956/02/05 DOA: 12/20/2022 PCP: [name], MD    Brief Narrative:  67 year old male with past medical history as below, which is significant for intestinal stromal tumor removal in 2022, known mesenteric stenosis, GERD, hypertension, and hyperlipidemia. In the few weeks prior to presentation on 9/11 he had been evaluated at several campuses for abdominal pain with noncontrasted CTs of the abdomen which were unrevealing. He continued to have abdominal pain, weight loss, and poor p.o. intake. He presented [name] on 9/11 and was admitted to the hospitalist for abdominal pain with concern for superior mesenteric artery thrombus. He was started on heparin infusion. Vascular surgery was consulted and ultimately a CT angiogram of the abdomen and pelvis was done 9/12 demonstrating acute occlusion of the distal SMA as well as occlusion of the inferior mesenteric artery. He was taken to the operating room emergently and and was found to have an necrotic area of the jejunum. This was resected, and SMA stent was placed, and the patient was left with an open abdomen. He also remained on the mechanical ventilator postoperatively and PCCM was asked to admit for ventilator management.  9/11 admitted with abdominal pain 9/12 CT demonstrating mesenteric occlusion, bowel resection and stent placed, to ICU on vent. Left in discontinuity  9/14 back to OR. Left in discontinuity as still concern about bowel integrity 9/16 back to OR  s sig areas of ischemia w/ 2 areas of perforation, another 66cm section of jejunum was resected and area of discontinuity was reanastomosed. Changed to argatroban post-op as unable to get heparin levels detectable  9/17 dropping RASS goal to 0 to -1, assessing for readiness to wean, serum creatinine and BUN still climbing some but making urine 9/18 iHD  9/20 rising pressor need, anemia: CTA abd: nothing emergent, transfused 9/22  extubated 9/25 poor tolerance of NGT clamping. Abx ended  9/26 trying clamping again. Adding PRN antihypertensives. Temp incr a bit, 100.6. holding off on HD and tunneled HD cath given incr UOP  9/26 transferred out of ICU 9/29 Argatroban stopped and held due to rectal bleeding 9/30 Hgb 6.1 at 1250, 2u PRBC ordered then had large bloody BM again overnight, repeat H/H pending. Was hypotensive initially to 70-80s but responded to 200cc IVF. Likely needs more blood, TRH to follow.  9/30 PCCM asked to see incase has ongoing bleeding and hypotension and needs ICU care again 10/1 ICU transfer for GI bleed. AC held.  10/2 endoscopy > no bleedin source identified 10/2 colonoscopy > deep ulcerations encompassing 80% on the observed bowel with stigmata of recent bleeding.   Assessment and Plan:  Acute mesenteric ischemia  -s/p thrombectomy and SMA stent placement with ex lap and SB resection 9/12 followed by re-exploration x 2 on 9/14 and 9/16.  -Completed course of vanc/mero on 9/25. Per vascular surgery the majority of his bowel is surviving off a 3.47mm stent. - management per vascular surgery - avoid hypotension - AC when able from a GIB standpoint.  -daily wet to dry dressing changes to midline abdominal wound per GS   Postoperative GIB, ABLA ongoing Colonoscopy 10/2 showeing deeps ulcerations in majority of the colon with evidence of bleeding likely due to ischemia.  - 1 unit PRBC 10/3  - trend hgb - AC on hold   Afib now NSR -  on Amio gtt when tx out of unit -no anticoagulation as above   HTN/HLD - home therapies on hold  AKI on CKD 3a, acidemia - nephro following - plans in the works for further HD and access - trend UOP and labs   Physical deconditioning  - PT/OT    Nutrition - TPN - diet resumed on 10/3 by Dr. Katrinka Blazing   Poor overall prognosis   DVT prophylaxis: Place and maintain sequential compression device Start: 12/22/22 0130    Code Status: Full Code Family  Communication: wife at bedside  Disposition Plan:  Level of care: Progressive Status is: Inpatient Remains inpatient appropriate     Consultants:  Vascular GI nephrology   Subjective: Had non-bloody BM   Objective: Vitals:   01/11/23 1559 01/11/23 1931 01/11/23 2335 01/12/23 0335  BP: (!) 145/68 (!) 164/73 (!) 159/74 (!) 142/59  Pulse: 84 80 80 80  Resp: (!) 22 17 20 16   Temp: 97.7 F (36.5 C) 97.6 F (36.4 C)  98.2 F (36.8 C)  TempSrc: Oral Oral  Oral  SpO2: 99% 99% 99% 99%  Weight:    102.9 kg  Height:        Intake/Output Summary (Last 24 hours) at 01/12/2023 0746 Last data filed at 01/12/2023 0330 Gross per 24 hour  Intake 2093.64 ml  Output --  Net 2093.64 ml   Filed Weights   01/10/23 0459 01/11/23 0500 01/12/23 0335  Weight: 103.1 kg 103.3 kg 102.9 kg    Examination:   General: Appearance:    Obese male in no acute distress     Lungs:     respirations unlabored  Heart:    Normal heart rate. Normal rhythm. No murmurs, rubs, or gallops.    MS:   All extremities are intact.    Neurologic:   Awake, alert       Data Reviewed: I have personally reviewed following labs and imaging studies  CBC: Recent Labs  Lab 01/10/23 1417 01/10/23 2107 01/11/23 0401 01/11/23 1228 01/11/23 1819 01/11/23 2100 01/12/23 0400  WBC 11.1* 10.3 9.7  --  12.0*  --  8.7  HGB 7.4* 7.1* 6.8* 8.6* 8.4* 7.7* 7.4*  HCT 22.3* 21.4* 20.5* 25.8* 25.0* 22.9* 22.2*  MCV 89.2 89.9 89.5  --  91.6  --  89.9  PLT 182 181 174  --  218  --  188   Basic Metabolic Panel: Recent Labs  Lab 01/07/23 0545 01/08/23 0500 01/09/23 0135 01/09/23 0452 01/09/23 1132 01/10/23 0333 01/11/23 0401 01/12/23 0400  NA 143 136  --  142 142 139 138 135  K 4.3 3.8  --  4.4 4.1 3.8 3.7 4.5  CL 113* 113*  --  117*  --  114* 111 105  CO2 17* 16*  --  12*  --  18* 18* 17*  GLUCOSE 174* 214*  --  156*  --  148* 134* 125*  BUN 97* 102*  --  119*  --  120* 130* 147*  CREATININE 2.34* 2.24*   --  2.77*  --  2.73* 2.95* 3.51*  CALCIUM 7.7* 7.4*  --  7.6*  --  7.3* 7.6* 8.0*  MG 1.9 2.0 2.0  --   --  1.9 2.2 2.3  PHOS 3.4 3.7  --  4.6  --  4.0 4.2  --    GFR: Estimated Creatinine Clearance: 24.6 mL/min (A) (by C-G formula based on SCr of 3.51 mg/dL (H)). Liver Function Tests: Recent Labs  Lab 01/07/23 0545 01/08/23 0500 01/09/23 0452 01/10/23 0333 01/11/23 0401  AST 155* 79*  --   --  50*  ALT 305* 198*  --   --  96*  ALKPHOS 128* 96  --   --  69  BILITOT 0.5 0.6  --   --  0.5  PROT 4.7* 4.3*  --   --  4.3*  ALBUMIN 1.8* 1.6* 1.6* 1.5* 1.6*   No results for input(s): "LIPASE", "AMYLASE" in the last 168 hours. No results for input(s): "AMMONIA" in the last 168 hours. Coagulation Profile: No results for input(s): "INR", "PROTIME" in the last 168 hours. Cardiac Enzymes: No results for input(s): "CKTOTAL", "CKMB", "CKMBINDEX", "TROPONINI" in the last 168 hours. BNP (last 3 results) Recent Labs    07/11/22 1451  PROBNP 7.0   HbA1C: No results for input(s): "HGBA1C" in the last 72 hours. CBG: Recent Labs  Lab 01/11/23 0756 01/11/23 1142 01/11/23 1956 01/12/23 0016 01/12/23 0349  GLUCAP 116* 145* 127* 135* 114*   Lipid Profile: No results for input(s): "CHOL", "HDL", "LDLCALC", "TRIG", "CHOLHDL", "LDLDIRECT" in the last 72 hours. Thyroid Function Tests: No results for input(s): "TSH", "T4TOTAL", "FREET4", "T3FREE", "THYROIDAB" in the last 72 hours. Anemia Panel: No results for input(s): "VITAMINB12", "FOLATE", "FERRITIN", "TIBC", "IRON", "RETICCTPCT" in the last 72 hours. Sepsis Labs: Recent Labs  Lab 01/06/23 1152 01/09/23 1527 01/09/23 1731  LATICACIDVEN 0.9 1.0 1.2    No results found for this or any previous visit (from the past 240 hour(s)).       Radiology Studies: No results found.      Scheduled Meds:  sodium chloride   Intravenous Once   sodium chloride   Intravenous Once   Chlorhexidine Gluconate Cloth  6 each Topical Daily    feeding supplement  237 mL Oral TID BM   Gerhardt's butt cream   Topical Daily   pantoprazole (PROTONIX) IV  40 mg Intravenous Q12H   sodium chloride flush  3 mL Intravenous Q12H   Continuous Infusions:  albumin human Stopped (01/01/23 1020)   amiodarone 30 mg/hr (01/12/23 0507)   anticoagulant sodium citrate     TPN ADULT (ION) 80 mL/hr at 01/11/23 1849     LOS: 23 days    Time spent: 45 minutes spent on chart review, discussion with nursing staff, consultants, updating family and interview/physical exam; more than 50% of that time was spent in counseling and/or coordination of care.    Joseph Art, DO [facility] Available via Epic secure chat 7am-7pm After these hours, please refer to coverage provider listed on [email or web address] 01/12/2023, 7:46 AM

## 2023-01-12 NOTE — Procedures (Signed)
  Procedure:  R internal jugular HD CVC placement 15cm Preprocedure diagnosis: The primary encounter diagnosis was Mesenteric ischemia (HCC). Diagnoses of Elevated serum creatinine and Superior mesenteric artery stenosis (HCC) were also pertinent to this visit. Postprocedure diagnosis: same EBL:    minimal Complications:   none immediate  See full dictation in [employer].  Thora Lance MD Main # [phone] Pager  [phone] Mobile [phone]

## 2023-01-12 NOTE — Progress Notes (Signed)
Progress Note    01/12/2023 1:15 PM 2 Days Post-Op  Subjective:  no complaints this morning, wants to go home when able   Vitals:   01/12/23 0758 01/12/23 1128  BP: (!) 161/74 (!) 166/74  Pulse: 82 84  Resp: 17 19  Temp: 98.5 F (36.9 C) 98.2 F (36.8 C)  SpO2: 98% 99%   Physical Exam: Lungs:  non labored Extremities:  palpable L PT; R pedal pulses absent but foot is warm Abdomen:  soft Neurologic: somnolent  CBC    Component Value Date/Time   WBC 8.7 01/12/2023 0400   RBC 2.47 (L) 01/12/2023 0400   HGB 7.4 (L) 01/12/2023 0400   HCT 22.2 (L) 01/12/2023 0400   PLT 188 01/12/2023 0400   MCV 89.9 01/12/2023 0400   MCH 30.0 01/12/2023 0400   MCHC 33.3 01/12/2023 0400   RDW 17.1 (H) 01/12/2023 0400   LYMPHSABS 0.9 10/30/2022 1654   MONOABS 0.6 10/30/2022 1654   EOSABS 0.3 10/30/2022 1654   BASOSABS 0.0 10/30/2022 1654    BMET    Component Value Date/Time   NA 135 01/12/2023 0400   K 4.5 01/12/2023 0400   CL 105 01/12/2023 0400   CO2 17 (L) 01/12/2023 0400   GLUCOSE 125 (H) 01/12/2023 0400   BUN 147 (H) 01/12/2023 0400   CREATININE 3.51 (H) 01/12/2023 0400   CALCIUM 8.0 (L) 01/12/2023 0400   GFRNONAA 18 (L) 01/12/2023 0400   GFRAA >60 04/14/2015 1127    INR    Component Value Date/Time   INR 1.7 (H) 12/29/2022 0809     Intake/Output Summary (Last 24 hours) at 01/12/2023 1315 Last data filed at 01/12/2023 0929 Gross per 24 hour  Intake 1486.86 ml  Output 300 ml  Net 1186.86 ml     Assessment/Plan:  67 y.o. male is s/p SMA stenting and small bowel resection.  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  Regarding the ischemic proctitis, there is no cure, and this is supportive management.  There is a chance, this improves with time.  If bleeding continues, can get endoscopic interventions to stop bleeding.  I have no endovascular options to  offer.  Bilateral hypogastric arteries are patent.  I had a long discussion with [name] and his wife today.  We discussed that I have no interventions to offer for the ischemic proctitis, and this will have to be managed conservatively.  Conservative measures include no antiplatelet administration.  This is unfortunate as a large portion of his bowel is living off of a 3.5 x 45 mm stent.  I do not know how long this will stay patent off of antiplatelet medication.  I made this very clear to both [name] and his wife.  [name] has 4 barriers to discharge-completing ADLs, p.o. intake, dialysis, rectal bleeding.  Fortunately the rectal bleeding has improved.  I told [name] very bluntly that he needs to start living every day like it is his last because when the stent occludes, there is not much I will be able to offer.  I think he would benefit from palliative care consult.   I considered the small bowel transplant consult to [facility] clinic, however his small bowel is not his problem at the moment.  It is the ischemic proctitis.  Should he recover.  I would be more than happy to send him up to [facility] clinic to discuss small bowel transplant.  With  his comorbidities and circumferentially calcified aorta, I do not think he will be a candidate, but I am happy to try.

## 2023-01-12 NOTE — Progress Notes (Signed)
Nutrition Follow-up  Diet advanced to regular 10/3. Calorie count was not started yesterday. RN to begin calorie count with collection of meal tickets today. Minimal intake this morning: half of a banana and 4 ounces of orange juice. Continues to receive TPN to meet 100% of estimated needs. Will continue calorie count over the weekend.   Gabriel Rainwater RD, LDN, CNSC Please refer to Amion for contact information.

## 2023-01-12 NOTE — Progress Notes (Signed)
Patient ID: [name] [name], male   DOB: 11-Jun-1955, 67 y.o.   MRN: [id number] [facility] Surgery Progress Note  2 Days Post-Op  Subjective: CC-  Wife at bedside. Denies abdominal pain, nausea, vomiting. Tolerating diet but not eating much. One small brown BM last night. Hgb 7.6  Objective: Vital signs in last 24 hours: Temp:  [97.6 F (36.4 C)-98.5 F (36.9 C)] 98.5 F (36.9 C) (10/04 0758) Pulse Rate:  [80-90] 82 (10/04 0758) Resp:  [11-22] 17 (10/04 0758) BP: (119-164)/(59-74) 161/74 (10/04 0758) SpO2:  [93 %-99 %] 98 % (10/04 0758) Weight:  [102.9 kg] 102.9 kg (10/04 0335) Last BM Date : 01/10/23  Intake/Output from previous day: 10/03 0701 - 10/04 0700 In: 2454.3 [P.O.:720; I.V.:1124.7; Blood:360.7; IV Piggyback:248.9] Out: -  Intake/Output this shift: Total I/O In: -  Out: 300 [Urine:300]  PE: Gen: alert, NAD Pulm: respirations unlabored  Abd: soft, nondistended, nontender, open midline incision beefy red without drainage   Lab Results:  Recent Labs    01/11/23 1819 01/11/23 2100 01/12/23 0400  WBC 12.0*  --  8.7  HGB 8.4* 7.7* 7.4*  HCT 25.0* 22.9* 22.2*  PLT 218  --  188   BMET Recent Labs    01/11/23 0401 01/12/23 0400  NA 138 135  K 3.7 4.5  CL 111 105  CO2 18* 17*  GLUCOSE 134* 125*  BUN 130* 147*  CREATININE 2.95* 3.51*  CALCIUM 7.6* 8.0*   PT/INR No results for input(s): "LABPROT", "INR" in the last 72 hours. CMP     Component Value Date/Time   NA 135 01/12/2023 0400   K 4.5 01/12/2023 0400   CL 105 01/12/2023 0400   CO2 17 (L) 01/12/2023 0400   GLUCOSE 125 (H) 01/12/2023 0400   BUN 147 (H) 01/12/2023 0400   CREATININE 3.51 (H) 01/12/2023 0400   CALCIUM 8.0 (L) 01/12/2023 0400   PROT 4.3 (L) 01/11/2023 0401   PROT 6.3 03/16/2020 0800   ALBUMIN 1.6 (L) 01/11/2023 0401   ALBUMIN 4.3 03/16/2020 0800   AST 50 (H) 01/11/2023 0401   ALT 96 (H) 01/11/2023 0401   ALKPHOS 69 01/11/2023 0401   BILITOT 0.5 01/11/2023 0401    BILITOT 0.8 03/16/2020 0800   GFRNONAA 18 (L) 01/12/2023 0400   GFRAA >60 04/14/2015 1127   Lipase     Component Value Date/Time   LIPASE 28 11/27/2022 0755       Studies/Results: No results found.  Anti-infectives: Anti-infectives (From admission, onward)    Start     Dose/Rate Route Frequency Ordered Stop   12/30/22 1800  vancomycin (VANCOCIN) IVPB 1000 mg/200 mL premix        1,000 mg 200 mL/hr over 60 Minutes Intravenous Every 24 hours 12/29/22 1844 01/03/23 1946   12/30/22 0600  vancomycin (VANCOCIN) IVPB 1000 mg/200 mL premix  Status:  Discontinued        1,000 mg 200 mL/hr over 60 Minutes Intravenous Every 12 hours 12/29/22 1842 12/29/22 1844   12/29/22 2200  meropenem (MERREM) 1 g in sodium chloride 0.9 % 100 mL IVPB  Status:  Discontinued        1 g 200 mL/hr over 30 Minutes Intravenous Every 12 hours 12/29/22 1705 12/29/22 1842   12/29/22 1930  meropenem (MERREM) 1 g in sodium chloride 0.9 % 100 mL IVPB        1 g 200 mL/hr over 30 Minutes Intravenous Every 8 hours 12/29/22 1842 01/04/23 0700   12/29/22 1800  vancomycin (VANCOREADY) IVPB 2000 mg/400 mL        2,000 mg 200 mL/hr over 120 Minutes Intravenous  Once 12/29/22 1705 12/29/22 1958   12/23/22 1230  piperacillin-tazobactam (ZOSYN) IVPB 2.25 g        2.25 g 100 mL/hr over 30 Minutes Intravenous Every 8 hours 12/23/22 1139 12/28/22 2109   12/22/22 0000  piperacillin-tazobactam (ZOSYN) IVPB 3.375 g  Status:  Discontinued        3.375 g 12.5 mL/hr over 240 Minutes Intravenous Every 8 hours 12/21/22 2331 12/23/22 1139        Assessment/Plan 67 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 Karin Lieu, exploratory laparotomy and small bowel resection Dr. Janee Morn -S/p re-exploration, replace abthera FB 12/23/22   -S/p re-exploration, small bowel resection with anastomosis x2, assessment of bowel perfusion with ICG, abdominal closure 9/16 Dr. Andrey Campanile -  BRBPR s/p egd/colonoscopy 10/2 that revealed severe ulceration in the rectum and recto- sigmoid colon, no active bleeding at the time. Biopsy showed ulceration with inflammatory changes consistent with ischemia and no viral cytopathic effect, stains pending - No further rectal bleeding. He had a nonbloody BM last night.  Monitor for further bleeding, supportive if occurs; noted possible hemospray per GI as one option if ongoing BRBPR. Unsure if any plans to attempt restarting blood thinning medications, will discuss with MD. - daily wet to dry dressing changes to midline abdominal wound   FEN: reg, TPN VTE: ASA PR, bivalirudin on hold currently  ID: No current abx    LOS: 23 days    Franne Forts, [facility] Surgery 01/12/2023, 9:32 AM Please see Amion for pager number during day hours 7:00am-4:30pm

## 2023-01-12 NOTE — Progress Notes (Signed)
Patient ID: [name] [name], male   DOB: May 14, 1955, 67 y.o.   MRN: [id number]  Subjective:  He was moved to 6 east.  Strict ins/outs are not available.  Spoke with his wife at bedside.  The patient and his wife both indicate that his urine output was good yesterday - they don't think it was recorded due to the transfer from the ICU.  Spoke with nurse tech and the patient's last stool this AM was brown.  We have discussed risks/benefits/indications for re-initiating dialysis and he does consent to dialysis.  His wife wants him to have whatever he needs.   Review of systems:   Denies chest pain  Denies n/v Denies overt shortness of breath   O:BP (!) 166/74 (BP Location: Left Arm)   Pulse 84   Temp 98.2 F (36.8 C) (Oral)   Resp 19   Ht 5\' 11"  (1.803 m)   Wt 102.9 kg   SpO2 99%   BMI 31.65 kg/m   Intake/Output Summary (Last 24 hours) at 01/12/2023 1224 Last data filed at 01/12/2023 0929 Gross per 24 hour  Intake 1581.02 ml  Output 300 ml  Net 1281.02 ml   Intake/Output: I/O last 3 completed shifts: In: 4257.8 [P.O.:1200; I.V.:2297.1; Blood:360.7; IV Piggyback:400] Out: 750 [Urine:750]  Intake/Output this shift:  Total I/O In: 240 [P.O.:240] Out: 300 [Urine:300] Weight change: -0.379 kg  General adult male in bed in no acute distress     HEENT normocephalic atraumatic extraocular movements intact sclera anicteric Neck supple trachea midline Lungs clear to auscultation bilaterally normal work of breathing at rest on room air Heart S1S2 no rub Abdomen soft nontender midline abd incision Extremities 2+ edema diffusely Psych normal mood and affect Neuro - alert and oriented x 3 provides hx and follows commands Access no HD access   Recent Labs  Lab 01/06/23 0620 01/06/23 1809 01/07/23 0545 01/08/23 0500 01/09/23 0452 01/09/23 1132 01/10/23 0333 01/11/23 0401 01/12/23 0400  NA 149* 147* 143 136 142 142 139 138 135  K 3.3* 4.0 4.3 3.8 4.4 4.1 3.8 3.7 4.5  CL 120* 116*  113* 113* 117*  --  114* 111 105  CO2 20* 17* 17* 16* 12*  --  18* 18* 17*  GLUCOSE 171* 188* 174* 214* 156*  --  148* 134* 125*  BUN 104* 100* 97* 102* 119*  --  120* 130* 147*  CREATININE 2.44* 2.30* 2.34* 2.24* 2.77*  --  2.73* 2.95* 3.51*  ALBUMIN  --   --  1.8* 1.6* 1.6*  --  1.5* 1.6*  --   CALCIUM 7.8* 7.9* 7.7* 7.4* 7.6*  --  7.3* 7.6* 8.0*  PHOS 3.4  --  3.4 3.7 4.6  --  4.0 4.2  --   AST  --   --  155* 79*  --   --   --  50*  --   ALT  --   --  305* 198*  --   --   --  96*  --    Liver Function Tests: Recent Labs  Lab 01/07/23 0545 01/08/23 0500 01/09/23 0452 01/10/23 0333 01/11/23 0401  AST 155* 79*  --   --  50*  ALT 305* 198*  --   --  96*  ALKPHOS 128* 96  --   --  69  BILITOT 0.5 0.6  --   --  0.5  PROT 4.7* 4.3*  --   --  4.3*  ALBUMIN 1.8* 1.6* 1.6* 1.5* 1.6*  CBC: Recent Labs  Lab 01/10/23 1417 01/10/23 2107 01/11/23 0401 01/11/23 1228 01/11/23 1819 01/11/23 2100 01/12/23 0400  WBC 11.1* 10.3 9.7  --  12.0*  --  8.7  HGB 7.4* 7.1* 6.8*   < > 8.4* 7.7* 7.4*  HCT 22.3* 21.4* 20.5*   < > 25.0* 22.9* 22.2*  MCV 89.2 89.9 89.5  --  91.6  --  89.9  PLT 182 181 174  --  218  --  188   < > = values in this interval not displayed.   Cardiac Enzymes: No results for input(s): "CKTOTAL", "CKMB", "CKMBINDEX", "TROPONINI" in the last 168 hours. CBG: Recent Labs  Lab 01/11/23 1956 01/12/23 0016 01/12/23 0349 01/12/23 0751 01/12/23 1125  GLUCAP 127* 135* 114* 128* 119*    Studies/Results: No results found.  sodium chloride   Intravenous Once   sodium chloride   Intravenous Once   Chlorhexidine Gluconate Cloth  6 each Topical Daily   feeding supplement  237 mL Oral TID BM   Gerhardt's butt cream   Topical Daily   pantoprazole (PROTONIX) IV  40 mg Intravenous Q12H   sodium chloride flush  3 mL Intravenous Q12H    BMET    Component Value Date/Time   NA 135 01/12/2023 0400   K 4.5 01/12/2023 0400   CL 105 01/12/2023 0400   CO2 17 (L) 01/12/2023  0400   GLUCOSE 125 (H) 01/12/2023 0400   BUN 147 (H) 01/12/2023 0400   CREATININE 3.51 (H) 01/12/2023 0400   CALCIUM 8.0 (L) 01/12/2023 0400   GFRNONAA 18 (L) 01/12/2023 0400   GFRAA >60 04/14/2015 1127   CBC    Component Value Date/Time   WBC 8.7 01/12/2023 0400   RBC 2.47 (L) 01/12/2023 0400   HGB 7.4 (L) 01/12/2023 0400   HCT 22.2 (L) 01/12/2023 0400   PLT 188 01/12/2023 0400   MCV 89.9 01/12/2023 0400   MCH 30.0 01/12/2023 0400   MCHC 33.3 01/12/2023 0400   RDW 17.1 (H) 01/12/2023 0400   LYMPHSABS 0.9 10/30/2022 1654   MONOABS 0.6 10/30/2022 1654   EOSABS 0.3 10/30/2022 1654   BASOSABS 0.0 10/30/2022 1654    Other studies:  Patient s/p colonoscopy and EGD on 10/2 with GI.  This noted deep and severe mucosal ulceration in the rectum and rectosigmoid colon (felt ischemic or infectious per GI) which was noted to not be actively bleeding on exam; also noted noted diverticulosis without active bleeding.  Exam was aborted in the descending colon per GI given lack of bowel prep and the pertinent rectal findings.  GI advised to avoid anticoagulation at this time.  His EGD noted erythematous mucosa in the gastric body which was not actively bleeding at the time of the exam.      Assessment/Plan:   Acute kidney injury on CKD IIIb: Baseline creatinine level around 2.0.  Acute kidney injury likely ischemic ATN due to ex lap surgery/IV contrast/hypotension  Started on HD 9/18 due to inability to wean from vent and worsening renal function.  He was then switched to CRRT on 9/20-24. Course complicated by GI bleed Consulting IR for tunneled catheter placement for 10/5 HD on 10/5 after access placed Will make him NPO after midnight  Agree with not intervening on right RAS given that his right kidney is already much smaller than anticipated, discussed with VVS at that time. Likely not much function being contributed from right.   Strict ins and outs and daily weights Note that if  contrast is  clinically indicated urgently or emergently would then proceed with contrast  Acute mesenteric ischemia: Status post small bowel resection and superior mesenteric artery stent placement.  Followed by vascular surgery.  S/p OR again 9/16 for exlap and further small bowel resection, abd closed. S/p CTA 9/20 without any emergent findings Hypernatremia: resolved Metabolic acidosis - s/p bicarb gtt  Vent -dependent respiratory failure: Now extubated.  On room air and monitoring volume and resp status closely given large amount of inputs required.   Anemia due to acute blood loss: transfuse prn for hgb <7. PRBC's per primary team. Multiple transfusions on 10/1.  S/p EGD and colonoscopy on 10/2.  Acute GI bleed - s/p PRBC's and GI is following.  S/p EGD and colonoscopy as above on 10/2 with severe mucosal ulceration in the rectum and rectosigmoid colon (felt ischemic or infectious per GI) Hypertension - shock resolved.  Avoid hypotension    Severe protein malnutrition - nutrition per primary team Transaminitis - may have been setting of shock - improving   Disposition - continue inpatient monitoring  Estanislado Emms, MD 12:54 PM 01/12/2023

## 2023-01-12 NOTE — Progress Notes (Addendum)
PHARMACY - TOTAL PARENTERAL NUTRITION CONSULT NOTE  Indication:  SMA syndrome w associated weight loss  Patient Measurements: Height: 5\' 11"  (180.3 cm) Weight: 102.9 kg (226 lb 14.4 oz) IBW/kg (Calculated) : 75.3 TPN AdjBW (KG): 80.4 Body mass index is 31.65 kg/m. Usual Weight: 95 kg, has lost 50 lbs recently   Assessment:  67 yo M admitted with abdominal pain, weight loss, and poor PO intake. 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 He is now s/p SMA stent placement. He was initially left is discontinuity with open abdomen and was closed on 9/16. TPN was initiated 9/14.   Pt evaluated by speech therapy 9/29 - was able to tolerate trials of ice chips, thin liquid, and puree w/o overt signs or aspiration or difficulty swallowing.  MBS completed 9/30 - cognitive based oral dysphagia; recommended for trial of full liquids and upgrade as mentation allows. Requires full assistance for feeding until mentation improves.   Glucose / Insulin: A1c 6.3% - CBGs < 140; off insulin Electrolytes: Na 125 (trending down), K 4.5 (received 40 mEq outside the bag 10/3), CO2 17 (max acetate in TPN), CoCa 9.92, phos added but unable to run in time, Others wnl  Renal: off CRRT 9/24, possible iHD soon. SCr 3.51, BUN 147 (possibly high due to GIB though more specific to upper GIB) Hepatic: alk phos/tbili wnl, AST down 50, ALT down 96, albumin 1.6, TG 88 (wnl) Intake / Output; MIVF: UOP incomplete charting (per nurse 10/4 ~300 mL), LBM 10/3 small x1 non-bloody (rectal bleed resolved). GI Imaging:  9/20 CTA: improved ostial stenosis post stenting, thrombus/occlusion of SMA/ileocolic artery improving, surgical site has increased dilatation, L lung nodule GI Surgeries / Procedures:  9/14 re-ex lap, left in discontinuity with plans for re-exploration  9/16 re-ex lap, SBR with anastomosis x2, abdominal closure 10/2 EGD: gastric erythematous mucosa, no  active bleeding  10/2 Colo: Deep and severe mucosal ulceration in the rectum and in the recto-sigmoid colon, likely ischemic or infectious, not actively bleeding; Exam aborted in the descending colon  Central access: CVC LIJ 12/23/22 TPN start date: 12/23/22  Nutritional Goals: Goal concentrated TPN rate is 80 mL/hr (provides 140 g AA and 2400 kCal per day) Goal un-concentrated TPN is 100 mL/hr (provides 140g AA and 2253 kCal per day)  RD Estimated Needs Total Energy Estimated Needs: 2300-2500 kcals Total Protein Estimated Needs: 140 -150 g Total Fluid Estimated Needs: >/= 2.3 L  Current Nutrition:  TPN 10/2 CLD  10/3: Regular diet, tolerating but not eating much. Observed eating graham cracker, pudding and juice. Pt able to masticate cracker and clear oral cavity, no signs of aspiration. Drank 1/4 Ensure in the afternoon. 10/4: 1/2 banana and 4 oz. Juice for breakfast  Plan:  Continue concentrated TPN at goal rate of 80 ml/hr to meet 100% of needs Electrolytes in TPN: increase Na 100 mEq/L (add 9/30 given Na trend), decrease K 20 mEq/L, Ca 5 mEq/L, Mg 7 mEq/L, Phos 0 mmol/L, Cl:Ac - max acetate.  Add standard MVI and trace elements to TPN Monitor TPN labs on Mon/Thurs, consider labs 10/6 Additional IVF per nephrology, f/u if planning to start HD  F/u PO intake and wean TPN as able   Laqueta Jean PharmD Candidate 01/12/2023 10:50 AM  Please check AMION for all [facility] phone numbers After 10:00 PM, call Main Pharmacy [phone]

## 2023-01-12 NOTE — Progress Notes (Addendum)
Attending physician's note   I have taken a history, reviewed the chart, and examined the patient. I performed a substantive portion of this encounter, including complete performance of at least one of the key components, in conjunction with the APP. I agree with the APP's note, impression, and recommendations with my edits.   H/H stable at 7.4/22.  No overt bleeding.  Flexible sigmoidoscopy on this admission notable for severely ulcerated colon with biopsies consistent with ischemia and no viral cytopathic effect (HSV and CMV staining is pending).  Thankfully not currently bleeding.  I discussed with patient and family member at bedside today.  If he does have rebleeding, could consider repeat flexible sigmoidoscopy with Hemospray and/or PuraStat gel application.  However, these methods are only temporizing measures and limit bleeding over 72 hours or so and would not be for durable curative intent.  Otherwise, given the friable nature of that ischemic mucosa, other endoscopic methods such as hemostatic clips would not be a good option.  Inpatient GI service will sign off at this time.  Please do not hesitate to contact us with additional questions or concerns.  Ann Held, [place] [phone] office         Daily Progress Note  DOA: 12/20/2022 Hospital Day: 24  Chief Complaint:   ASSESSMENT    Brief Narrative:  [name] [name] is a 67 y.o. year old male with a history of  hypertension, hyperlipidemia, obesity and a complicated GI history including GERD, GIST resection in 2023, prior GI bleeds with duodenal ulcers, intestinal angioectasis, diverticular hemorrhage, gastroduodenitis. Admitted with small bowel ischemia. GI evaluated 9/30 for hematochezia   Acute mesenteric ischemia 2/2 SMA stenosis / thrombosis . He is s/p SMA thrombectomy and stenting followed by exploratory laparotomy and small bowel resection then back to OR for resection of necrotic jejunum.      GI  bleeding 2/2 to severely ulcerated colon seen on sigmoidoscopy this admission. Path consistent with ischemia but still awaiting results of HSV and CMV stains.   No further GI bleeding . Hgb overall stable at 7.4 since last u PRBCs yesterday morning   Afib, on Amio  AKI Worsening . BUN 147 / Cr 3.51 / GFR 18  PLAN   No further recommendations   Subjective   No further GI bleeding . No significant abdominal pain   Objective     Recent Labs    01/11/23 0401 01/11/23 1228 01/11/23 1819 01/11/23 2100 01/12/23 0400  WBC 9.7  --  12.0*  --  8.7  HGB 6.8*   < > 8.4* 7.7* 7.4*  HCT 20.5*   < > 25.0* 22.9* 22.2*  PLT 174  --  218  --  188   < > = values in this interval not displayed.   BMET Recent Labs    01/10/23 0333 01/11/23 0401 01/12/23 0400  NA 139 138 135  K 3.8 3.7 4.5  CL 114* 111 105  CO2 18* 18* 17*  GLUCOSE 148* 134* 125*  BUN 120* 130* 147*  CREATININE 2.73* 2.95* 3.51*  CALCIUM 7.3* 7.6* 8.0*   LFT Recent Labs    01/11/23 0401  PROT 4.3*  ALBUMIN 1.6*  AST 50*  ALT 96*  ALKPHOS 69  BILITOT 0.5   PT/INR No results for input(s): "LABPROT", "INR" in the last 72 hours.   Imaging:  DG Chest Port 1 View CLINICAL DATA:  Shortness of breath with abdominal pain for several days. History of hypertension.  EXAM: PORTABLE CHEST 1 VIEW  COMPARISON:  Radiographs 12/29/2022 and 12/23/2022. Chest CT 12/29/2022.  FINDINGS: 1121 hours. Interval extubation and removal of the enteric tube. Right IJ central venous catheter has been removed. Left IJ central venous catheter appears unchanged, projecting to the mid SVC level.  The heart size and mediastinal contours are stable. There are persistent low lung volumes. The lungs appear clear. No pleural effusion or pneumothorax. The bones appear unchanged, without acute findings. Telemetry leads overlie the chest.  IMPRESSION: Interval extubation and removal of the enteric tube and right IJ central  venous catheter. No acute cardiopulmonary process.  Electronically Signed   By: Carey Bullocks M.D.   On: 01/09/2023 13:23     Scheduled inpatient medications:   sodium chloride   Intravenous Once   sodium chloride   Intravenous Once   Chlorhexidine Gluconate Cloth  6 each Topical Daily   feeding supplement  237 mL Oral TID BM   Gerhardt's butt cream   Topical Daily   pantoprazole (PROTONIX) IV  40 mg Intravenous Q12H   sodium chloride flush  3 mL Intravenous Q12H   Continuous inpatient infusions:   albumin human Stopped (01/01/23 1020)   amiodarone 30 mg/hr (01/12/23 0507)   TPN ADULT (ION) 80 mL/hr at 01/11/23 1849   TPN ADULT (ION)     PRN inpatient medications: albumin human, bisacodyl, haloperidol lactate, heparin, heparin, hydrALAZINE, lidocaine (PF), lidocaine-prilocaine, mouth rinse, oxyCODONE, pentafluoroprop-tetrafluoroeth  Vital signs in last 24 hours: Temp:  [97.6 F (36.4 C)-98.5 F (36.9 C)] 98.2 F (36.8 C) (10/04 1128) Pulse Rate:  [80-86] 84 (10/04 1128) Resp:  [15-22] 19 (10/04 1128) BP: (141-166)/(59-74) 166/74 (10/04 1128) SpO2:  [97 %-99 %] 99 % (10/04 1128) Weight:  [102.9 kg] 102.9 kg (10/04 0335) Last BM Date : 01/11/23  Intake/Output Summary (Last 24 hours) at 01/12/2023 1329 Last data filed at 01/12/2023 0929 Gross per 24 hour  Intake 1486.86 ml  Output 300 ml  Net 1186.86 ml    Intake/Output from previous day: 10/03 0701 - 10/04 0700 In: 2454.3 [P.O.:720; I.V.:1124.7; Blood:360.7; IV Piggyback:248.9] Out: -  Intake/Output this shift: Total I/O In: 240 [P.O.:240] Out: 300 [Urine:300]   Physical Exam:  General: Alert male in NAD Heart:  Regular rate and rhythm.  Pulmonary: Normal respiratory effort Abdomen: Soft, nondistended, normal bowel sounds. Neurologic: Alert and oriented Psych: Pleasant. Cooperative. Insight appears normal.    Principal Problem:   Superior mesenteric artery stenosis (HCC) Active Problems:    Hyperlipidemia   Essential hypertension   Hypoalbuminemia   GERD (gastroesophageal reflux disease)   Acute renal failure superimposed on stage 3a chronic kidney disease (HCC)   Mesenteric ischemia (HCC)   Endotracheal tube present   Protein-calorie malnutrition, severe   Acute respiratory failure (HCC)   Physical deconditioning   Hematochezia   Colon ulcer     LOS: 23 days   Willette Cluster ,NP 01/12/2023, 1:29 PM

## 2023-01-13 DIAGNOSIS — K551 Chronic vascular disorders of intestine: Secondary | ICD-10-CM | POA: Diagnosis not present

## 2023-01-13 LAB — COMPREHENSIVE METABOLIC PANEL
ALT: 81 U/L — ABNORMAL HIGH (ref 0–44)
AST: 35 U/L (ref 15–41)
Albumin: 1.7 g/dL — ABNORMAL LOW (ref 3.5–5.0)
Alkaline Phosphatase: 83 U/L (ref 38–126)
Anion gap: 16 — ABNORMAL HIGH (ref 5–15)
BUN: 160 mg/dL — ABNORMAL HIGH (ref 8–23)
CO2: 18 mmol/L — ABNORMAL LOW (ref 22–32)
Calcium: 8.3 mg/dL — ABNORMAL LOW (ref 8.9–10.3)
Chloride: 104 mmol/L (ref 98–111)
Creatinine, Ser: 4.05 mg/dL — ABNORMAL HIGH (ref 0.61–1.24)
GFR, Estimated: 15 mL/min — ABNORMAL LOW (ref >60)
Glucose, Bld: 122 mg/dL — ABNORMAL HIGH (ref 70–99)
Potassium: 4.8 mmol/L (ref 3.5–5.1)
Sodium: 138 mmol/L (ref 135–145)
Total Bilirubin: 0.6 mg/dL (ref 0.3–1.2)
Total Protein: 4.7 g/dL — ABNORMAL LOW (ref 6.5–8.1)

## 2023-01-13 LAB — CBC
HCT: 22.7 % — ABNORMAL LOW (ref 39.0–52.0)
Hemoglobin: 7.6 g/dL — ABNORMAL LOW (ref 13.0–17.0)
MCH: 30.9 pg (ref 26.0–34.0)
MCHC: 33.5 g/dL (ref 30.0–36.0)
MCV: 92.3 fL (ref 80.0–100.0)
Platelets: 183 10*3/uL (ref 150–400)
RBC: 2.46 MIL/uL — ABNORMAL LOW (ref 4.22–5.81)
RDW: 17.2 % — ABNORMAL HIGH (ref 11.5–15.5)
WBC: 7.9 10*3/uL (ref 4.0–10.5)
nRBC: 0 % (ref 0.0–0.2)

## 2023-01-13 LAB — GLUCOSE, CAPILLARY
Glucose-Capillary: 112 mg/dL — ABNORMAL HIGH (ref 70–99)
Glucose-Capillary: 118 mg/dL — ABNORMAL HIGH (ref 70–99)
Glucose-Capillary: 119 mg/dL — ABNORMAL HIGH (ref 70–99)
Glucose-Capillary: 123 mg/dL — ABNORMAL HIGH (ref 70–99)
Glucose-Capillary: 131 mg/dL — ABNORMAL HIGH (ref 70–99)
Glucose-Capillary: 135 mg/dL — ABNORMAL HIGH (ref 70–99)
Glucose-Capillary: 145 mg/dL — ABNORMAL HIGH (ref 70–99)

## 2023-01-13 MED ORDER — ANTICOAGULANT SODIUM CITRATE 4% (200MG/5ML) IV SOLN
5.0000 mL | Status: DC | PRN
  Filled 2023-01-13: qty 5

## 2023-01-13 MED ORDER — ALTEPLASE 2 MG IJ SOLR: 2.0000 mg | Freq: Once | INTRAMUSCULAR | Status: DC | PRN

## 2023-01-13 MED ORDER — LIDOCAINE-PRILOCAINE 2.5-2.5 % EX CREA: 1.0000 | TOPICAL_CREAM | CUTANEOUS | Status: DC | PRN

## 2023-01-13 MED ORDER — PENTAFLUOROPROP-TETRAFLUOROETH EX AERO: 1.0000 | INHALATION_SPRAY | CUTANEOUS | Status: DC | PRN

## 2023-01-13 MED ORDER — TRACE MINERALS CU-MN-SE-ZN 300-55-60-3000 MCG/ML IV SOLN
INTRAVENOUS | Status: AC
  Filled 2023-01-13: qty 934.4

## 2023-01-13 MED ORDER — HEPARIN SODIUM (PORCINE) 1000 UNIT/ML DIALYSIS: 1000.0000 [IU] | INTRAMUSCULAR | Status: DC | PRN

## 2023-01-13 MED ORDER — HEPARIN SODIUM (PORCINE) 1000 UNIT/ML IJ SOLN
INTRAMUSCULAR | Status: AC
  Filled 2023-01-13: qty 3

## 2023-01-13 MED ORDER — LIDOCAINE HCL (PF) 1 % IJ SOLN: 5.0000 mL | INTRAMUSCULAR | Status: DC | PRN

## 2023-01-13 NOTE — Progress Notes (Signed)
3 Days Post-Op   Subjective/Chief Complaint: Patient without complaint.  No recent evidence of ongoing GI bleeding   Objective: Vital signs in last 24 hours: Temp:  [98.2 F (36.8 C)-99 F (37.2 C)] 98.7 F (37.1 C) (10/05 0741) Pulse Rate:  [79-85] 84 (10/05 0425) Resp:  [15-21] 15 (10/05 0741) BP: (157-166)/(66-77) 161/66 (10/05 0741) SpO2:  [98 %-100 %] 98 % (10/05 0425) Weight:  [103.4 kg] 103.4 kg (10/05 0425) Last BM Date : 01/12/23  Intake/Output from previous day: 10/04 0701 - 10/05 0700 In: 240 [P.O.:240] Out: 2700 [Urine:2700] Intake/Output this shift: No intake/output data recorded.   Gen: alert, NAD Pulm: respirations unlabored  Abd: soft, nondistended, nontender, open midline incision beefy red without drainage Lab Results:  Recent Labs    01/12/23 1755 01/13/23 0400  WBC 8.5 7.9  HGB 7.3* 7.6*  HCT 22.4* 22.7*  PLT 190 183   BMET Recent Labs    01/12/23 0400 01/13/23 0400  NA 135 138  K 4.5 4.8  CL 105 104  CO2 17* 18*  GLUCOSE 125* 122*  BUN 147* 160*  CREATININE 3.51* 4.05*  CALCIUM 8.0* 8.3*   PT/INR No results for input(s): "LABPROT", "INR" in the last 72 hours. ABG No results for input(s): "PHART", "HCO3" in the last 72 hours.  Invalid input(s): "PCO2", "PO2"  Studies/Results: IR Fluoro Guide CV Line Right  Result Date: 01/13/2023 CLINICAL DATA:  Renal failure, needs  venous access for dialysis EXAM: EXAM RIGHT IJ CATHETER PLACEMENT UNDER ULTRASOUND AND FLUOROSCOPIC GUIDANCE TECHNIQUE: The procedure, risks (including but not limited to bleeding, infection, organ damage, pneumothorax), benefits, and alternatives were explained to the patient. Questions regarding the procedure were encouraged and answered. The patient understands and consents to the procedure. Patency of the right IJ vein was confirmed with ultrasound with image documentation. An appropriate skin site was determined. Skin site was marked. Region was prepped using  maximum barrier technique including cap and mask, sterile gown, sterile gloves, large sterile sheet, and Chlorhexidine as cutaneous antisepsis. The region was infiltrated locally with 1% lidocaine. Under real-time ultrasound guidance, the right IJ vein was accessed with a 21 gauge needle; the needle tip within the vein was confirmed with ultrasound image documentation. The needle exchanged over a 018 guidewire for vascular dilator which allowed advancement of a 16 cm Mahurkar catheter. This was positioned with the tip at the cavoatrial junction. Spot chest radiograph shows good positioning and no pneumothorax. Catheter was flushed and sutured externally with 0-Prolene sutures. Patient tolerated the procedure well. FLUOROSCOPY TIME:  Radiation Exposure Index (as provided by the fluoroscopic device): 1 mGy air Kerma COMPLICATIONS: COMPLICATIONS none IMPRESSION: 1. Technically successful right IJ Mahurkar catheter placement. Electronically Signed   By: Corlis Leak M.D.   On: 01/13/2023 09:29   IR US Guide Vasc Access Right  Result Date: 01/13/2023 CLINICAL DATA:  Renal failure, needs  venous access for dialysis EXAM: EXAM RIGHT IJ CATHETER PLACEMENT UNDER ULTRASOUND AND FLUOROSCOPIC GUIDANCE TECHNIQUE: The procedure, risks (including but not limited to bleeding, infection, organ damage, pneumothorax), benefits, and alternatives were explained to the patient. Questions regarding the procedure were encouraged and answered. The patient understands and consents to the procedure. Patency of the right IJ vein was confirmed with ultrasound with image documentation. An appropriate skin site was determined. Skin site was marked. Region was prepped using maximum barrier technique including cap and mask, sterile gown, sterile gloves, large sterile sheet, and Chlorhexidine as cutaneous antisepsis. The region was infiltrated locally with  1% lidocaine. Under real-time ultrasound guidance, the right IJ vein was accessed with a 21  gauge needle; the needle tip within the vein was confirmed with ultrasound image documentation. The needle exchanged over a 018 guidewire for vascular dilator which allowed advancement of a 16 cm Mahurkar catheter. This was positioned with the tip at the cavoatrial junction. Spot chest radiograph shows good positioning and no pneumothorax. Catheter was flushed and sutured externally with 0-Prolene sutures. Patient tolerated the procedure well. FLUOROSCOPY TIME:  Radiation Exposure Index (as provided by the fluoroscopic device): 1 mGy air Kerma COMPLICATIONS: COMPLICATIONS none IMPRESSION: 1. Technically successful right IJ Mahurkar catheter placement. Electronically Signed   By: Corlis Leak M.D.   On: 01/13/2023 09:29    Anti-infectives: Anti-infectives (From admission, onward)    Start     Dose/Rate Route Frequency Ordered Stop   12/30/22 1800  vancomycin (VANCOCIN) IVPB 1000 mg/200 mL premix        1,000 mg 200 mL/hr over 60 Minutes Intravenous Every 24 hours 12/29/22 1844 01/03/23 1946   12/30/22 0600  vancomycin (VANCOCIN) IVPB 1000 mg/200 mL premix  Status:  Discontinued        1,000 mg 200 mL/hr over 60 Minutes Intravenous Every 12 hours 12/29/22 1842 12/29/22 1844   12/29/22 2200  meropenem (MERREM) 1 g in sodium chloride 0.9 % 100 mL IVPB  Status:  Discontinued        1 g 200 mL/hr over 30 Minutes Intravenous Every 12 hours 12/29/22 1705 12/29/22 1842   12/29/22 1930  meropenem (MERREM) 1 g in sodium chloride 0.9 % 100 mL IVPB        1 g 200 mL/hr over 30 Minutes Intravenous Every 8 hours 12/29/22 1842 01/04/23 0700   12/29/22 1800  vancomycin (VANCOREADY) IVPB 2000 mg/400 mL        2,000 mg 200 mL/hr over 120 Minutes Intravenous  Once 12/29/22 1705 12/29/22 1958   12/23/22 1230  piperacillin-tazobactam (ZOSYN) IVPB 2.25 g        2.25 g 100 mL/hr over 30 Minutes Intravenous Every 8 hours 12/23/22 1139 12/28/22 2109   12/22/22 0000  piperacillin-tazobactam (ZOSYN) IVPB 3.375 g  Status:   Discontinued        3.375 g 12.5 mL/hr over 240 Minutes Intravenous Every 8 hours 12/21/22 2331 12/23/22 1139       Assessment/Plan: s/p Procedure(s): ESOPHAGOGASTRODUODENOSCOPY (EGD) WITH PROPOFOL (N/A) COLONOSCOPY (N/A) BIOPSY 67 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 Karin Lieu, exploratory laparotomy and small bowel resection Dr. Janee Morn -S/p re-exploration, replace abthera FB 12/23/22   -S/p re-exploration, small bowel resection with anastomosis x2, assessment of bowel perfusion with ICG, abdominal closure 9/16 Dr. Andrey Campanile   - BRBPR s/p egd/colonoscopy 10/2 that revealed severe ulceration in the rectum and recto- sigmoid colon, no active bleeding at the time. Biopsy showed ulceration with inflammatory changes consistent with ischemia and no viral cytopathic effect, stains pending - No further rectal bleeding.  Hemoglobin stable overnight  Would hold off on anticoagulation for now given all of his bleeding issues.  If he is stable over the next 24 hours, reasonable to restart antiplatelet therapy.  High risk of bleeding though given overall condition.  Few options remaining surgically if he continues to bleed.  - daily wet to dry dressing changes to midline abdominal wound   FEN: reg, TPN VTE: ASA PR, bivalirudin on hold currently  ID: No current abx  LOS: 24 days  Clovis Pu Blakeley Margraf MD 01/13/2023

## 2023-01-13 NOTE — Progress Notes (Signed)
PT Cancellation Note  Patient Details Name: [name] [name] MRN: [id number] DOB: 02-Jul-1955   Cancelled Treatment:    Reason Eval/Treat Not Completed: (P) Patient at procedure or test/unavailable, pt off unit at HD. Will check back as schedule allows to continue with PT POC.  Lenora Boys. PTA Acute Rehabilitation Services Office: [phone]    Catalina Antigua 01/13/2023, 3:23 PM

## 2023-01-13 NOTE — Progress Notes (Signed)
PROGRESS NOTE    [name] [name]  YSA:[id number] DOB: [birth year] DOA: 12/20/2022 PCP: [name], MD    Brief Narrative:  67 year old male with past medical history as below, which is significant for intestinal stromal tumor removal in 2022, known mesenteric stenosis, GERD, hypertension, and hyperlipidemia. In the few weeks prior to presentation on 9/11 he had been evaluated at several campuses for abdominal pain with noncontrasted CTs of the abdomen which were unrevealing. He continued to have abdominal pain, weight loss, and poor p.o. intake. He presented [name] on 9/11 and was admitted to the hospitalist for abdominal pain with concern for superior mesenteric artery thrombus. He was started on heparin infusion. Vascular surgery was consulted and ultimately a CT angiogram of the abdomen and pelvis was done 9/12 demonstrating acute occlusion of the distal SMA as well as occlusion of the inferior mesenteric artery. He was taken to the operating room emergently and and was found to have an necrotic area of the jejunum. This was resected, and SMA stent was placed, and the patient was left with an open abdomen. He also remained on the mechanical ventilator postoperatively and PCCM was asked to admit for ventilator management.  9/11 admitted with abdominal pain 9/12 CT demonstrating mesenteric occlusion, bowel resection and stent placed, to ICU on vent. Left in discontinuity  9/14 back to OR. Left in discontinuity as still concern about bowel integrity 9/16 back to OR  s sig areas of ischemia w/ 2 areas of perforation, another 66cm section of jejunum was resected and area of discontinuity was reanastomosed. Changed to argatroban post-op as unable to get heparin levels detectable  9/17 dropping RASS goal to 0 to -1, assessing for readiness to wean, serum creatinine and BUN still climbing some but making urine 9/18 iHD  9/20 rising pressor need, anemia: CTA abd: nothing emergent, transfused 9/22  extubated 9/25 poor tolerance of NGT clamping. Abx ended  9/26 trying clamping again. Adding PRN antihypertensives. Temp incr a bit, 100.6. holding off on HD and tunneled HD cath given incr UOP  9/26 transferred out of ICU 9/29 Argatroban stopped and held due to rectal bleeding 9/30 Hgb 6.1 at 1250, 2u PRBC ordered then had large bloody BM again overnight, repeat H/H pending. Was hypotensive initially to 70-80s but responded to 200cc IVF. Likely needs more blood, TRH to follow.  9/30 PCCM asked to see incase has ongoing bleeding and hypotension and needs ICU care again 10/1 ICU transfer for GI bleed. AC held.  10/2 endoscopy > no bleedin source identified 10/2 colonoscopy > deep ulcerations encompassing 80% on the observed bowel with stigmata of recent bleeding.   Assessment and Plan:  Acute mesenteric ischemia  -s/p thrombectomy and SMA stent placement with ex lap and SB resection 9/12 followed by re-exploration x 2 on 9/14 and 9/16.  -Completed course of vanc/mero on 9/25. Per vascular surgery the majority of his bowel is surviving off a 3.56mm stent. - management per vascular surgery - avoid hypotension - AC when able from a GIB standpoint-- surgery suggests in next 24-48 hours -daily wet to dry dressing changes to midline abdominal wound per GS   Postoperative GIB, ABLA ongoing Colonoscopy 10/2 showeing deeps ulcerations in majority of the colon with evidence of bleeding likely due to ischemia.  - 1 unit PRBC 10/3  - trend hgb - AC on hold- see above   Afib now NSR -  on Amio gtt when tx out of unit-- change to PO? - anticoagulation as  above   HTN/HLD - home therapies on hold    AKI on CKD 3a, acidemia - nephro following - HD started - trend UOP and labs   Physical deconditioning  - PT/OT    Nutrition - TPN - diet resumed on 10/3 by Dr. Katrinka Blazing  Obesity Estimated body mass index is 31.79 kg/m as calculated from the following:   Height as of this encounter: 5\' 11"   (1.803 m).   Weight as of this encounter: 103.4 kg.   Poor overall prognosis   DVT prophylaxis: Place and maintain sequential compression device Start: 12/22/22 0130    Code Status: Full Code Family Communication: wife at bedside 10/4  Disposition Plan:  Level of care: Progressive Status is: Inpatient Remains inpatient appropriate     Consultants:  Vascular GI Nephrology PCCM   Subjective: Swelling better post HD   Objective: Vitals:   01/12/23 1913 01/13/23 0005 01/13/23 0425 01/13/23 0741  BP: (!) 165/77 (!) 158/71 (!) 165/67 (!) 161/66  Pulse: 83 79 84   Resp: (!) 21 20 17 15   Temp: 99 F (37.2 C) 98.3 F (36.8 C) 98.6 F (37 C) 98.7 F (37.1 C)  TempSrc: Oral Oral Oral Oral  SpO2: 98% 100% 98%   Weight:   103.4 kg   Height:        Intake/Output Summary (Last 24 hours) at 01/13/2023 1126 Last data filed at 01/13/2023 0500 Gross per 24 hour  Intake --  Output 2400 ml  Net -2400 ml   Filed Weights   01/11/23 0500 01/12/23 0335 01/13/23 0425  Weight: 103.3 kg 102.9 kg 103.4 kg    Examination:   General: Appearance:    Obese male in no acute distress     Lungs:     respirations unlabored  Heart:    Normal heart rate. Normal rhythm. No murmurs, rubs, or gallops.    MS:   All extremities are intact.    Neurologic:   Awake, alert       Data Reviewed: I have personally reviewed following labs and imaging studies  CBC: Recent Labs  Lab 01/11/23 0401 01/11/23 1228 01/11/23 1819 01/11/23 2100 01/12/23 0400 01/12/23 1755 01/13/23 0400  WBC 9.7  --  12.0*  --  8.7 8.5 7.9  HGB 6.8*   < > 8.4* 7.7* 7.4* 7.3* 7.6*  HCT 20.5*   < > 25.0* 22.9* 22.2* 22.4* 22.7*  MCV 89.5  --  91.6  --  89.9 92.2 92.3  PLT 174  --  218  --  188 190 183   < > = values in this interval not displayed.   Basic Metabolic Panel: Recent Labs  Lab 01/07/23 0545 01/08/23 0500 01/09/23 0135 01/09/23 0452 01/09/23 1132 01/10/23 0333 01/11/23 0401 01/12/23 0400  01/13/23 0400  NA 143 136  --  142 142 139 138 135 138  K 4.3 3.8  --  4.4 4.1 3.8 3.7 4.5 4.8  CL 113* 113*  --  117*  --  114* 111 105 104  CO2 17* 16*  --  12*  --  18* 18* 17* 18*  GLUCOSE 174* 214*  --  156*  --  148* 134* 125* 122*  BUN 97* 102*  --  119*  --  120* 130* 147* 160*  CREATININE 2.34* 2.24*  --  2.77*  --  2.73* 2.95* 3.51* 4.05*  CALCIUM 7.7* 7.4*  --  7.6*  --  7.3* 7.6* 8.0* 8.3*  MG 1.9 2.0 2.0  --   --  1.9 2.2 2.3  --   PHOS 3.4 3.7  --  4.6  --  4.0 4.2  --   --    GFR: Estimated Creatinine Clearance: 21.4 mL/min (A) (by C-G formula based on SCr of 4.05 mg/dL (H)). Liver Function Tests: Recent Labs  Lab 01/07/23 0545 01/08/23 0500 01/09/23 0452 01/10/23 0333 01/11/23 0401 01/13/23 0400  AST 155* 79*  --   --  50* 35  ALT 305* 198*  --   --  96* 81*  ALKPHOS 128* 96  --   --  69 83  BILITOT 0.5 0.6  --   --  0.5 0.6  PROT 4.7* 4.3*  --   --  4.3* 4.7*  ALBUMIN 1.8* 1.6* 1.6* 1.5* 1.6* 1.7*   No results for input(s): "LIPASE", "AMYLASE" in the last 168 hours. No results for input(s): "AMMONIA" in the last 168 hours. Coagulation Profile: No results for input(s): "INR", "PROTIME" in the last 168 hours. Cardiac Enzymes: No results for input(s): "CKTOTAL", "CKMB", "CKMBINDEX", "TROPONINI" in the last 168 hours. BNP (last 3 results) Recent Labs    07/11/22 1451  PROBNP 7.0   HbA1C: No results for input(s): "HGBA1C" in the last 72 hours. CBG: Recent Labs  Lab 01/12/23 1125 01/12/23 1958 01/13/23 0005 01/13/23 0423 01/13/23 0738  GLUCAP 119* 126* 112* 118* 119*   Lipid Profile: No results for input(s): "CHOL", "HDL", "LDLCALC", "TRIG", "CHOLHDL", "LDLDIRECT" in the last 72 hours. Thyroid Function Tests: No results for input(s): "TSH", "T4TOTAL", "FREET4", "T3FREE", "THYROIDAB" in the last 72 hours. Anemia Panel: No results for input(s): "VITAMINB12", "FOLATE", "FERRITIN", "TIBC", "IRON", "RETICCTPCT" in the last 72 hours. Sepsis  Labs: Recent Labs  Lab 01/06/23 1152 01/09/23 1527 01/09/23 1731  LATICACIDVEN 0.9 1.0 1.2    No results found for this or any previous visit (from the past 240 hour(s)).       Radiology Studies: IR Fluoro Guide CV Line Right  Result Date: 01/13/2023 CLINICAL DATA:  Renal failure, needs  venous access for dialysis EXAM: EXAM RIGHT IJ CATHETER PLACEMENT UNDER ULTRASOUND AND FLUOROSCOPIC GUIDANCE TECHNIQUE: The procedure, risks (including but not limited to bleeding, infection, organ damage, pneumothorax), benefits, and alternatives were explained to the patient. Questions regarding the procedure were encouraged and answered. The patient understands and consents to the procedure. Patency of the right IJ vein was confirmed with ultrasound with image documentation. An appropriate skin site was determined. Skin site was marked. Region was prepped using maximum barrier technique including cap and mask, sterile gown, sterile gloves, large sterile sheet, and Chlorhexidine as cutaneous antisepsis. The region was infiltrated locally with 1% lidocaine. Under real-time ultrasound guidance, the right IJ vein was accessed with a 21 gauge needle; the needle tip within the vein was confirmed with ultrasound image documentation. The needle exchanged over a 018 guidewire for vascular dilator which allowed advancement of a 16 cm Mahurkar catheter. This was positioned with the tip at the cavoatrial junction. Spot chest radiograph shows good positioning and no pneumothorax. Catheter was flushed and sutured externally with 0-Prolene sutures. Patient tolerated the procedure well. FLUOROSCOPY TIME:  Radiation Exposure Index (as provided by the fluoroscopic device): 1 mGy air Kerma COMPLICATIONS: COMPLICATIONS none IMPRESSION: 1. Technically successful right IJ Mahurkar catheter placement. Electronically Signed   By: Corlis Leak M.D.   On: 01/13/2023 09:29   IR US Guide Vasc Access Right  Result Date:  01/13/2023 CLINICAL DATA:  Renal failure, needs  venous access for dialysis EXAM: EXAM RIGHT IJ  CATHETER PLACEMENT UNDER ULTRASOUND AND FLUOROSCOPIC GUIDANCE TECHNIQUE: The procedure, risks (including but not limited to bleeding, infection, organ damage, pneumothorax), benefits, and alternatives were explained to the patient. Questions regarding the procedure were encouraged and answered. The patient understands and consents to the procedure. Patency of the right IJ vein was confirmed with ultrasound with image documentation. An appropriate skin site was determined. Skin site was marked. Region was prepped using maximum barrier technique including cap and mask, sterile gown, sterile gloves, large sterile sheet, and Chlorhexidine as cutaneous antisepsis. The region was infiltrated locally with 1% lidocaine. Under real-time ultrasound guidance, the right IJ vein was accessed with a 21 gauge needle; the needle tip within the vein was confirmed with ultrasound image documentation. The needle exchanged over a 018 guidewire for vascular dilator which allowed advancement of a 16 cm Mahurkar catheter. This was positioned with the tip at the cavoatrial junction. Spot chest radiograph shows good positioning and no pneumothorax. Catheter was flushed and sutured externally with 0-Prolene sutures. Patient tolerated the procedure well. FLUOROSCOPY TIME:  Radiation Exposure Index (as provided by the fluoroscopic device): 1 mGy air Kerma COMPLICATIONS: COMPLICATIONS none IMPRESSION: 1. Technically successful right IJ Mahurkar catheter placement. Electronically Signed   By: Corlis Leak M.D.   On: 01/13/2023 09:29        Scheduled Meds:  Chlorhexidine Gluconate Cloth  6 each Topical Q0600   feeding supplement  237 mL Oral TID BM   Gerhardt's butt cream   Topical Daily   pantoprazole (PROTONIX) IV  40 mg Intravenous Q12H   sodium chloride flush  3 mL Intravenous Q12H   Continuous Infusions:  albumin human Stopped  (01/01/23 1020)   amiodarone 30 mg/hr (01/13/23 0712)   TPN ADULT (ION) 80 mL/hr at 01/12/23 1821     LOS: 24 days    Time spent: 45 minutes spent on chart review, discussion with nursing staff, consultants, updating family and interview/physical exam; more than 50% of that time was spent in counseling and/or coordination of care.    Joseph Art, DO [facility] Available via Epic secure chat 7am-7pm After these hours, please refer to coverage provider listed on [email or web address] 01/13/2023, 11:26 AM

## 2023-01-13 NOTE — Plan of Care (Signed)

## 2023-01-13 NOTE — Progress Notes (Signed)
   01/13/23 1711  Vitals  Temp 98.5 F (36.9 C)  Pulse Rate 91  Resp 20  BP (!) 147/72  SpO2 98 %  O2 Device Room Air  Weight (S)  101.6 kg (Weight Bed)  Type of Weight Post-Dialysis  Oxygen Therapy  Patient Activity (if Appropriate) In bed  Pulse Oximetry Type Continuous  Post Treatment  Dialyzer Clearance Clear  Hemodialysis Intake (mL) 0 mL  Liters Processed 37.5  Fluid Removed (mL) 1000 mL  Tolerated HD Treatment Yes  Post-Hemodialysis Comments Pt. tolerated HD treatment without difficulties.Hand off report to 6E Bedside RN [facility]. UF goal met, Admin Medication per order.   Received patient in bed to unit.  Alert and oriented.  Informed consent signed and in chart.   TX duration: 2.5  Patient tolerated well.  Transported back to the room  Alert, without acute distress.  Hand-off given to patient's nurse.   Access used: Yes Access issues: No  Total UF removed: 1000 Medication(s) given: See MAR Post HD VS: See Above Grid Post HD weight: 101.6 kg   [facility]

## 2023-01-13 NOTE — Progress Notes (Signed)
PHARMACY - TOTAL PARENTERAL NUTRITION CONSULT NOTE  Indication:  SMA syndrome w associated weight loss  Patient Measurements: Height: 5\' 11"  (180.3 cm) Weight: 103.4 kg (227 lb 15.3 oz) IBW/kg (Calculated) : 75.3 TPN AdjBW (KG): 80.4 Body mass index is 31.79 kg/m. Usual Weight: 95 kg, has lost 50 lbs recently   Assessment:  67 yo M admitted with abdominal pain, weight loss, and poor PO intake. 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 He is now s/p SMA stent placement. He was initially left is discontinuity with open abdomen and was closed on 9/16. TPN was initiated 9/14.   Pt evaluated by speech therapy 9/29 - was able to tolerate trials of ice chips, thin liquid, and puree w/o overt signs or aspiration or difficulty swallowing.  MBS completed 9/30 - cognitive based oral dysphagia; recommended for trial of full liquids and upgrade as mentation allows. Requires full assistance for feeding until mentation improves.   Glucose / Insulin: A1c 6.3% - CBGs < 140; off insulin Electrolytes: Na 135 (trending up), K 4.8, CO2 87 (max acetate in TPN), CoCa ~10, Others wnl  Renal: off CRRT 9/24, possible iHD soon. SCr 4.05, BUN 160 (possibly high due to GIB though more specific to upper GIB) Hepatic: alk phos/tbili wnl, AST down 35, ALT down 81, albumin 1.7, TG 88 (wnl) Intake / Output; MIVF: UOP 1.1 mL/kg/hr, LBM 10/4 per charting (rectal bleed resolved)  GI Imaging:  9/20 CTA: improved ostial stenosis post stenting, thrombus/occlusion of SMA/ileocolic artery improving, surgical site has increased dilatation, L lung nodule GI Surgeries / Procedures:  9/14 re-ex lap, left in discontinuity with plans for re-exploration  9/16 re-ex lap, SBR with anastomosis x2, abdominal closure 10/2 EGD: gastric erythematous mucosa, no active bleeding  10/2 Colo: Deep and severe mucosal ulceration in the rectum and in the recto-sigmoid colon, likely  ischemic or infectious, not actively bleeding; Exam aborted in the descending colon  Central access: CVC LIJ 12/23/22 TPN start date: 12/23/22  Nutritional Goals: Goal concentrated TPN rate is 80 mL/hr (provides 140 g AA and 2400 kCal per day) Goal un-concentrated TPN is 100 mL/hr (provides 140g AA and 2253 kCal per day)  RD Estimated Needs Total Energy Estimated Needs: 2300-2500 kcals Total Protein Estimated Needs: 140 -150 g Total Fluid Estimated Needs: >/= 2.3 L  Current Nutrition:  TPN 10/2 CLD  10/3: (starting calorie count) regular diet, tolerating but not eating much. Observed eating graham cracker, pudding and juice. Pt able to masticate cracker and clear oral cavity, no signs of aspiration. Drank 1/4 Ensure in the afternoon 10/4: 1/2 banana and 4 oz. Juice for breakfast  Plan:  Continue concentrated TPN at goal rate of 80 ml/hr to meet 100% of needs Electrolytes in TPN: increase Na 100 mEq/L (add 9/30 given Na trend), K 20 mEq/L, Ca 5 mEq/L, Mg 7 mEq/L, Phos 0 mmol/L, Cl:Ac - max acetate.  Add standard MVI and trace elements to TPN Monitor TPN labs on Mon/Thurs, consider labs 10/6 Additional IVF per nephrology, f/u HD schedule, next HD 10/5 F/u PO intake/calorie count and wean TPN as able   Thank you for allowing pharmacy to be a part of this patient's care.  Thelma Barge, PharmD Clinical Pharmacist

## 2023-01-13 NOTE — Progress Notes (Signed)
Patient ID: [name] [name], male   DOB: 12-31-1955, 67 y.o.   MRN: [id number]  Subjective:  He had 2.7 liters UOP over 10/4 charted.  He had a nontunneled catheter placed on 10/4 with IR.   Seen and examined on dialysis.  Procedure supervised.  Blood pressure 148/80 and HR 95.  Tolerating goal.  RIJ nontunneled tunn catheter in use  Review of systems:    Denies chest pain  Denies n/v Denies overt shortness of breath  He has had back discomfort - worse with positioning required for HD  O:BP 137/80   Pulse 95   Temp 98.4 F (36.9 C)   Resp 18   Ht 5\' 11"  (1.803 m)   Wt (S) 102.4 kg Comment: Bed Scale  SpO2 99%   BMI 31.49 kg/m   Intake/Output Summary (Last 24 hours) at 01/13/2023 1515 Last data filed at 01/13/2023 0500 Gross per 24 hour  Intake --  Output 1600 ml  Net -1600 ml   Intake/Output: I/O last 3 completed shifts: In: 960 [P.O.:960] Out: 2700 [Urine:2700]  Intake/Output this shift:  No intake/output data recorded. Weight change: 0.479 kg  General adult male in bed in no acute distress     HEENT normocephalic atraumatic extraocular movements intact sclera anicteric Neck supple trachea midline Lungs clear to auscultation bilaterally normal work of breathing at rest on room air Heart S1S2 no rub Abdomen soft nontender distended Extremities 2+ edema lower extremities Psych normal mood and affect Neuro - alert and oriented x 3 provides hx and follows commands Access RIJ nontunneled catheter    Recent Labs  Lab 01/07/23 0545 01/08/23 0500 01/09/23 0452 01/09/23 1132 01/10/23 0333 01/11/23 0401 01/12/23 0400 01/13/23 0400  NA 143 136 142 142 139 138 135 138  K 4.3 3.8 4.4 4.1 3.8 3.7 4.5 4.8  CL 113* 113* 117*  --  114* 111 105 104  CO2 17* 16* 12*  --  18* 18* 17* 18*  GLUCOSE 174* 214* 156*  --  148* 134* 125* 122*  BUN 97* 102* 119*  --  120* 130* 147* 160*  CREATININE 2.34* 2.24* 2.77*  --  2.73* 2.95* 3.51* 4.05*  ALBUMIN 1.8* 1.6* 1.6*  --  1.5*  1.6*  --  1.7*  CALCIUM 7.7* 7.4* 7.6*  --  7.3* 7.6* 8.0* 8.3*  PHOS 3.4 3.7 4.6  --  4.0 4.2  --   --   AST 155* 79*  --   --   --  50*  --  35  ALT 305* 198*  --   --   --  96*  --  81*   Liver Function Tests: Recent Labs  Lab 01/08/23 0500 01/09/23 0452 01/10/23 0333 01/11/23 0401 01/13/23 0400  AST 79*  --   --  50* 35  ALT 198*  --   --  96* 81*  ALKPHOS 96  --   --  69 83  BILITOT 0.6  --   --  0.5 0.6  PROT 4.3*  --   --  4.3* 4.7*  ALBUMIN 1.6*   < > 1.5* 1.6* 1.7*   < > = values in this interval not displayed.   CBC: Recent Labs  Lab 01/11/23 0401 01/11/23 1228 01/11/23 1819 01/11/23 2100 01/12/23 0400 01/12/23 1755 01/13/23 0400  WBC 9.7  --  12.0*  --  8.7 8.5 7.9  HGB 6.8*   < > 8.4*   < > 7.4* 7.3* 7.6*  HCT 20.5*   < >  25.0*   < > 22.2* 22.4* 22.7*  MCV 89.5  --  91.6  --  89.9 92.2 92.3  PLT 174  --  218  --  188 190 183   < > = values in this interval not displayed.   Cardiac Enzymes: No results for input(s): "CKTOTAL", "CKMB", "CKMBINDEX", "TROPONINI" in the last 168 hours. CBG: Recent Labs  Lab 01/12/23 1958 01/13/23 0005 01/13/23 0423 01/13/23 0738 01/13/23 1242  GLUCAP 126* 112* 118* 119* 145*    Studies/Results: IR Fluoro Guide CV Line Right  Result Date: 01/13/2023 CLINICAL DATA:  Renal failure, needs  venous access for dialysis EXAM: EXAM RIGHT IJ CATHETER PLACEMENT UNDER ULTRASOUND AND FLUOROSCOPIC GUIDANCE TECHNIQUE: The procedure, risks (including but not limited to bleeding, infection, organ damage, pneumothorax), benefits, and alternatives were explained to the patient. Questions regarding the procedure were encouraged and answered. The patient understands and consents to the procedure. Patency of the right IJ vein was confirmed with ultrasound with image documentation. An appropriate skin site was determined. Skin site was marked. Region was prepped using maximum barrier technique including cap and mask, sterile gown, sterile  gloves, large sterile sheet, and Chlorhexidine as cutaneous antisepsis. The region was infiltrated locally with 1% lidocaine. Under real-time ultrasound guidance, the right IJ vein was accessed with a 21 gauge needle; the needle tip within the vein was confirmed with ultrasound image documentation. The needle exchanged over a 018 guidewire for vascular dilator which allowed advancement of a 16 cm Mahurkar catheter. This was positioned with the tip at the cavoatrial junction. Spot chest radiograph shows good positioning and no pneumothorax. Catheter was flushed and sutured externally with 0-Prolene sutures. Patient tolerated the procedure well. FLUOROSCOPY TIME:  Radiation Exposure Index (as provided by the fluoroscopic device): 1 mGy air Kerma COMPLICATIONS: COMPLICATIONS none IMPRESSION: 1. Technically successful right IJ Mahurkar catheter placement. Electronically Signed   By: Corlis Leak M.D.   On: 01/13/2023 09:29   IR US Guide Vasc Access Right  Result Date: 01/13/2023 CLINICAL DATA:  Renal failure, needs  venous access for dialysis EXAM: EXAM RIGHT IJ CATHETER PLACEMENT UNDER ULTRASOUND AND FLUOROSCOPIC GUIDANCE TECHNIQUE: The procedure, risks (including but not limited to bleeding, infection, organ damage, pneumothorax), benefits, and alternatives were explained to the patient. Questions regarding the procedure were encouraged and answered. The patient understands and consents to the procedure. Patency of the right IJ vein was confirmed with ultrasound with image documentation. An appropriate skin site was determined. Skin site was marked. Region was prepped using maximum barrier technique including cap and mask, sterile gown, sterile gloves, large sterile sheet, and Chlorhexidine as cutaneous antisepsis. The region was infiltrated locally with 1% lidocaine. Under real-time ultrasound guidance, the right IJ vein was accessed with a 21 gauge needle; the needle tip within the vein was confirmed with  ultrasound image documentation. The needle exchanged over a 018 guidewire for vascular dilator which allowed advancement of a 16 cm Mahurkar catheter. This was positioned with the tip at the cavoatrial junction. Spot chest radiograph shows good positioning and no pneumothorax. Catheter was flushed and sutured externally with 0-Prolene sutures. Patient tolerated the procedure well. FLUOROSCOPY TIME:  Radiation Exposure Index (as provided by the fluoroscopic device): 1 mGy air Kerma COMPLICATIONS: COMPLICATIONS none IMPRESSION: 1. Technically successful right IJ Mahurkar catheter placement. Electronically Signed   By: Corlis Leak M.D.   On: 01/13/2023 09:29    Chlorhexidine Gluconate Cloth  6 each Topical Q0600   feeding supplement  237 mL  Oral TID BM   Gerhardt's butt cream   Topical Daily   heparin sodium (porcine)       pantoprazole (PROTONIX) IV  40 mg Intravenous Q12H   sodium chloride flush  3 mL Intravenous Q12H    BMET    Component Value Date/Time   NA 138 01/13/2023 0400   K 4.8 01/13/2023 0400   CL 104 01/13/2023 0400   CO2 18 (L) 01/13/2023 0400   GLUCOSE 122 (H) 01/13/2023 0400   BUN 160 (H) 01/13/2023 0400   CREATININE 4.05 (H) 01/13/2023 0400   CALCIUM 8.3 (L) 01/13/2023 0400   GFRNONAA 15 (L) 01/13/2023 0400   GFRAA >60 04/14/2015 1127   CBC    Component Value Date/Time   WBC 7.9 01/13/2023 0400   RBC 2.46 (L) 01/13/2023 0400   HGB 7.6 (L) 01/13/2023 0400   HCT 22.7 (L) 01/13/2023 0400   PLT 183 01/13/2023 0400   MCV 92.3 01/13/2023 0400   MCH 30.9 01/13/2023 0400   MCHC 33.5 01/13/2023 0400   RDW 17.2 (H) 01/13/2023 0400   LYMPHSABS 0.9 10/30/2022 1654   MONOABS 0.6 10/30/2022 1654   EOSABS 0.3 10/30/2022 1654   BASOSABS 0.0 10/30/2022 1654    Other studies:  Patient s/p colonoscopy and EGD on 10/2 with GI.  This noted deep and severe mucosal ulceration in the rectum and rectosigmoid colon (felt ischemic or infectious per GI) which was noted to not be actively  bleeding on exam; also noted noted diverticulosis without active bleeding.  Exam was aborted in the descending colon per GI given lack of bowel prep and the pertinent rectal findings.  GI advised to avoid anticoagulation at this time.  His EGD noted erythematous mucosa in the gastric body which was not actively bleeding at the time of the exam.      Assessment/Plan:   Acute kidney injury on CKD IIIb: Baseline creatinine level around 2.0.  Acute kidney injury likely ischemic ATN due to ex lap surgery/IV contrast/hypotension  Started on HD 9/18 due to inability to wean from vent and worsening renal function.  He was then switched to CRRT on 9/20-24. Course complicated by GI bleed Consulted IR - he wasn't able to get a tunneled catheter yesterday but he had a nontunneled catheter placed  HD today and on Monday, 10/7 Agree with not intervening on right RAS given that his right kidney is already much smaller than anticipated, discussed with VVS at that time. Likely not much function being contributed from right.   Strict ins and outs and daily weights Note that if contrast is clinically indicated urgently or emergently would then proceed with contrast  Acute mesenteric ischemia: Status post small bowel resection and superior mesenteric artery stent placement.  Followed by vascular surgery.  S/p OR again 9/16 for exlap and further small bowel resection, abd closed. S/p CTA 9/20 without any emergent findings Metabolic acidosis - s/p bicarb gtt. Starting back HD Vent -dependent respiratory failure: Now extubated.  On room air and monitoring volume and resp status closely given large amount of inputs required.   Anemia due to acute blood loss: transfuse prn for hgb <7. PRBC's per primary team. Multiple transfusions on 10/1.  S/p EGD and colonoscopy on 10/2.  Acute GI bleed - s/p PRBC's and GI is following.  S/p EGD and colonoscopy as above on 10/2 with severe mucosal ulceration in the rectum and rectosigmoid  colon (felt ischemic or infectious per GI) Hypertension - shock resolved.  Avoid hypotension  Severe protein malnutrition - nutrition per primary team Transaminitis - may have been setting of shock - improving   Disposition - continue inpatient monitoring  Estanislado Emms, MD 3:29 PM 01/13/2023

## 2023-01-13 NOTE — Progress Notes (Signed)
Occupational Therapy Treatment Patient Details Name: [name] [name] MRN: [id number] DOB: [birth year] Today's Date: 01/13/2023   History of present illness Pt is a 67 y/o male admitted 9/11 with several weeks of abdominal pain along with a 30 pound weight loss. Work up included mesenteric ischemia and necrotic area of jejunum. S/p Exploratory laparotomy and small bowel resection 9/12, reopening of the laparotomy 9/14, second re-opening of laparotomy and more small bowel resection with anastomosis x2 9/16. 9/18 iHD, Extubated 9/22; 9/26 trans out of ICU; 9/29 rectal bleeding began 9/30-10/1 hypotensive and return to ICU with ongoing bleeding; 10/2 endoscopy and colonoscopy deep ulcerations encompassing 80% on the observed bowel with stigmata of recent bleeding. PMHx:testicular CA, HTN, ankle and patellar fx's, retinal detachment   OT comments  Pt agreed to session and completed light hand strengthening and educated about positioning at this time. Pt then reported they needed to have a BM and was able to bridge to complete placement of be pan. Pt then tried but was unable to complete at this time. Pt reported this fatigued pt and could not complete any further activity. Pt and wife was educated about the difference of SNF to [facility] and they are requesting for SNF stay following dc from hospital.       If plan is discharge home, recommend the following:  Assist for transportation;Assistance with cooking/housework;Two people to help with walking and/or transfers;A lot of help with bathing/dressing/bathroom   Equipment Recommendations  None recommended by OT    Recommendations for Other Services      Precautions / Restrictions Precautions Precautions: Fall Precaution Comments: anxious, watch bp, decrease in skin integrity Restrictions Weight Bearing Restrictions: No       Mobility Bed Mobility Overal bed mobility: Needs Assistance             General bed mobility comments: bridging for bed  pan placement    Transfers                   General transfer comment: pt decline transfer at this time     Balance                                           ADL either performed or assessed with clinical judgement   ADL Overall ADL's : Needs assistance/impaired Eating/Feeding: Modified independent   Grooming: Wash/dry hands;Wash/dry face;Set up;Sitting   Upper Body Bathing: Moderate assistance;Bed level   Lower Body Bathing: Total assistance;Bed level   Upper Body Dressing : Moderate assistance;Sitting;Maximal assistance   Lower Body Dressing: Total assistance;Maximal assistance;Bed level       Toileting- Clothing Manipulation and Hygiene: Total assistance;Bed level              Extremity/Trunk Assessment Upper Extremity Assessment Upper Extremity Assessment: Generalized weakness   Lower Extremity Assessment Lower Extremity Assessment: Defer to PT evaluation        Vision       Perception     Praxis      Cognition Arousal: Alert Behavior During Therapy: Flat affect Overall Cognitive Status: Impaired/Different from baseline                         Following Commands: Follows multi-step commands consistently   Awareness: Emergent Problem Solving: Slow processing          Exercises Other Exercises Other  Exercises: light hand strengthening and educated family about positioning    Shoulder Instructions       General Comments      Pertinent Vitals/ Pain       Pain Assessment Pain Assessment: Faces Faces Pain Scale: Hurts a little bit Facial Expression: Relaxed, neutral Body Movements: Absence of movements Muscle Tension: Relaxed Compliance with ventilator (intubated pts.): N/A Vocalization (extubated pts.): N/A CPOT Total: 0 Pain Location: R hip Pain Descriptors / Indicators: Grimacing, Sore Pain Intervention(s): Limited activity within patient's tolerance, Monitored during session, Premedicated  before session  Home Living                                          Prior Functioning/Environment              Frequency  Min 1X/week        Progress Toward Goals  OT Goals(current goals can now be found in the care plan section)  Progress towards OT goals: Progressing toward goals  Acute Rehab OT Goals Patient Stated Goal: to get stronger OT Goal Formulation: With patient Time For Goal Achievement: 01/25/23 Potential to Achieve Goals: Good ADL Goals Pt Will Perform Grooming: with set-up;sitting Pt Will Perform Upper Body Bathing: with min assist;sitting Pt Will Perform Upper Body Dressing: with min assist;sitting Pt Will Transfer to Toilet: with supervision;stand pivot transfer;bedside commode Pt/caregiver will Perform Home Exercise Program: Increased ROM;Both right and left upper extremity;With Supervision  Plan      Co-evaluation                 AM-PAC OT "6 Clicks" Daily Activity     Outcome Measure   Help from another person eating meals?: A Little Help from another person taking care of personal grooming?: A Little Help from another person toileting, which includes using toliet, bedpan, or urinal?: A Lot Help from another person bathing (including washing, rinsing, drying)?: A Lot Help from another person to put on and taking off regular upper body clothing?: A Lot Help from another person to put on and taking off regular lower body clothing?: A Lot 6 Click Score: 14    End of Session    OT Visit Diagnosis: Unsteadiness on feet (R26.81);Muscle weakness (generalized) (M62.81)   Activity Tolerance Patient limited by fatigue   Patient Left in bed;with call bell/phone within reach;with bed alarm set;with family/visitor present   Nurse Communication Mobility status        Time: 6045-4098 OT Time Calculation (min): 52 min  Charges: OT General Charges $OT Visit: 1 Visit OT Treatments $Self Care/Home Management : 38-52  mins  [name] [name] OTR/L  Acute Rehab Services  [phone] office number   [name] [name] 01/13/2023, 11:41 AM

## 2023-01-14 DIAGNOSIS — K551 Chronic vascular disorders of intestine: Secondary | ICD-10-CM | POA: Diagnosis not present

## 2023-01-14 LAB — RENAL FUNCTION PANEL
Albumin: 1.5 g/dL — ABNORMAL LOW (ref 3.5–5.0)
Anion gap: 12 (ref 5–15)
BUN: 110 mg/dL — ABNORMAL HIGH (ref 8–23)
CO2: 25 mmol/L (ref 22–32)
Calcium: 7.7 mg/dL — ABNORMAL LOW (ref 8.9–10.3)
Chloride: 100 mmol/L (ref 98–111)
Creatinine, Ser: 3.17 mg/dL — ABNORMAL HIGH (ref 0.61–1.24)
GFR, Estimated: 21 mL/min — ABNORMAL LOW (ref >60)
Glucose, Bld: 127 mg/dL — ABNORMAL HIGH (ref 70–99)
Phosphorus: 4.3 mg/dL (ref 2.5–4.6)
Potassium: 3.9 mmol/L (ref 3.5–5.1)
Sodium: 137 mmol/L (ref 135–145)

## 2023-01-14 LAB — CBC
HCT: 20.9 % — ABNORMAL LOW (ref 39.0–52.0)
Hemoglobin: 6.9 g/dL — CL (ref 13.0–17.0)
MCH: 29.1 pg (ref 26.0–34.0)
MCHC: 33 g/dL (ref 30.0–36.0)
MCV: 88.2 fL (ref 80.0–100.0)
Platelets: 168 10*3/uL (ref 150–400)
RBC: 2.37 MIL/uL — ABNORMAL LOW (ref 4.22–5.81)
RDW: 16.6 % — ABNORMAL HIGH (ref 11.5–15.5)
WBC: 6.3 10*3/uL (ref 4.0–10.5)
nRBC: 0 % (ref 0.0–0.2)

## 2023-01-14 LAB — PREPARE RBC (CROSSMATCH)

## 2023-01-14 LAB — HEMOGLOBIN AND HEMATOCRIT, BLOOD
HCT: 22.7 % — ABNORMAL LOW (ref 39.0–52.0)
Hemoglobin: 7.5 g/dL — ABNORMAL LOW (ref 13.0–17.0)

## 2023-01-14 LAB — GLUCOSE, CAPILLARY
Glucose-Capillary: 130 mg/dL — ABNORMAL HIGH (ref 70–99)
Glucose-Capillary: 135 mg/dL — ABNORMAL HIGH (ref 70–99)
Glucose-Capillary: 127 mg/dL — ABNORMAL HIGH (ref 70–99)
Glucose-Capillary: 123 mg/dL — ABNORMAL HIGH (ref 70–99)
Glucose-Capillary: 113 mg/dL — ABNORMAL HIGH (ref 70–99)

## 2023-01-14 LAB — MAGNESIUM: Magnesium: 2 mg/dL (ref 1.7–2.4)

## 2023-01-14 MED ORDER — TRACE MINERALS CU-MN-SE-ZN 300-55-60-3000 MCG/ML IV SOLN
INTRAVENOUS | Status: AC
  Filled 2023-01-14: qty 934.4

## 2023-01-14 MED ORDER — CEFAZOLIN SODIUM-DEXTROSE 2-4 GM/100ML-% IV SOLN
2.0000 g | Freq: Once | INTRAVENOUS | Status: DC
  Filled 2023-01-14: qty 100

## 2023-01-14 MED ORDER — CHLORHEXIDINE GLUCONATE CLOTH 2 % EX PADS
6.0000 | MEDICATED_PAD | Freq: Every day | CUTANEOUS | Status: DC
  Administered 2023-01-15 – 2023-01-24 (×10): 6 via TOPICAL

## 2023-01-14 MED ORDER — AMIODARONE HCL 200 MG PO TABS
200.0000 mg | ORAL_TABLET | Freq: Every day | ORAL | Status: DC
  Administered 2023-01-14 – 2023-01-24 (×11): 200 mg via ORAL
  Filled 2023-01-14 (×11): qty 1

## 2023-01-14 MED ORDER — SODIUM CHLORIDE 0.9% IV SOLUTION: Freq: Once | INTRAVENOUS | Status: DC

## 2023-01-14 NOTE — Progress Notes (Signed)
PHARMACY - TOTAL PARENTERAL NUTRITION CONSULT NOTE  Indication:  SMA syndrome w associated weight loss  Patient Measurements: Height: 5\' 11"  (180.3 cm) Weight: 99.8 kg (220 lb 0.3 oz) IBW/kg (Calculated) : 75.3 TPN AdjBW (KG): 80.4 Body mass index is 30.69 kg/m. Usual Weight: 95 kg, has lost 50 lbs recently   Assessment:  67 yo M admitted with abdominal pain, weight loss, and poor PO intake. 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 He is now s/p SMA stent placement. He was initially left is discontinuity with open abdomen and was closed on 9/16. TPN was initiated 9/14.   Pt evaluated by speech therapy 9/29 - was able to tolerate trials of ice chips, thin liquid, and puree w/o overt signs or aspiration or difficulty swallowing.  MBS completed 9/30 - cognitive based oral dysphagia; recommended for trial of full liquids and upgrade as mentation allows. Requires full assistance for feeding until mentation improves.   Glucose / Insulin: A1c 6.3% - CBGs < 140; off insulin Electrolytes: Na 137, K 3.9, CO2 25 (max acetate in TPN), CoCa 9.7, Others wnl  Renal: off CRRT 9/24, iHD initiated, last HD 10/05. SCr 4.053.17, BUN 110 (possibly high due to GIB though more specific to upper GIB) Hepatic: alk phos/tbili wnl, AST down 35, ALT down 81, albumin 1.5, TG 88 (wnl) Intake / Output; MIVF: UOP 0.6 mL/kg/hr, LBM 10/5 per charting (rectal bleed resolved)  GI Imaging:  9/20 CTA: improved ostial stenosis post stenting, thrombus/occlusion of SMA/ileocolic artery improving, surgical site has increased dilatation, L lung nodule GI Surgeries / Procedures:  9/14 re-ex lap, left in discontinuity with plans for re-exploration  9/16 re-ex lap, SBR with anastomosis x2, abdominal closure 10/2 EGD: gastric erythematous mucosa, no active bleeding  10/2 Colo: Deep and severe mucosal ulceration in the rectum and in the recto-sigmoid colon, likely  ischemic or infectious, not actively bleeding; Exam aborted in the descending colon  Central access: CVC LIJ 12/23/22 TPN start date: 12/23/22  Nutritional Goals: Goal concentrated TPN rate is 80 mL/hr (provides 140 g AA and 2400 kCal per day) Goal un-concentrated TPN is 100 mL/hr (provides 140g AA and 2253 kCal per day)  RD Estimated Needs Total Energy Estimated Needs: 2300-2500 kcals Total Protein Estimated Needs: 140 -150 g Total Fluid Estimated Needs: >/= 2.3 L  Current Nutrition:  TPN 10/2 CLD  10/3: (starting calorie count) regular diet, tolerating but not eating much  Plan:  Continue concentrated TPN at goal rate of 80 ml/hr to meet 100% of needs Electrolytes in TPN: increase Na 100 mEq/L (add 9/30 given Na trend), K 20 mEq/L, Ca 5 mEq/L, Mg 7 mEq/L, Phos 0 mmol/L, Cl:Ac - max acetate.  Add standard MVI and trace elements to TPN Monitor TPN labs on Mon/Thurs, consider labs 10/6 Additional IVF per nephrology, f/u HD schedule, next HD 10/5 F/u PO intake/calorie count and wean TPN as able   Thank you for allowing pharmacy to be a part of this patient's care.  Thelma Barge, PharmD Clinical Pharmacist

## 2023-01-14 NOTE — Progress Notes (Signed)
Pt's Hgb 6.9 this a.m; attempted to notify Howerter, MD. Awaiting further orders.  Bari Edward, RN

## 2023-01-14 NOTE — Progress Notes (Signed)
Patient ID: [name] [name], male   DOB: 1955/04/27, 67 y.o.   MRN: [id number]  Subjective:  He had 1.4 liters UOP over 10/5 charted.  He had HD on 10/5 with 1 kg UF.  He states that due to back discomfort dialysis was rough.  He wonders if sitting in a chair for HD would be easier and I recommended that he first try to sit in a chair in the room for several hours to see if he can tolerate.  He is ok with going ahead and getting a tunneled catheter tomorrow - hopefully this will help for HD to be a little more comfortable.   He asks if possible for outside company to come work in his legs (ie. He mentions [employer]) and I asked him to discuss with is primary team if this would be possible inpatient - I'm not sure  Review of systems:     Denies chest pain  Denies n/v Denies overt shortness of breath  He has had back discomfort   O:BP (!) 152/73   Pulse 81   Temp 98.4 F (36.9 C) (Oral)   Resp (!) 21   Ht 5\' 11"  (1.803 m)   Wt 99.8 kg   SpO2 99%   BMI 30.69 kg/m   Intake/Output Summary (Last 24 hours) at 01/14/2023 1125 Last data filed at 01/14/2023 1020 Gross per 24 hour  Intake 3560.12 ml  Output 2400 ml  Net 1160.12 ml   Intake/Output: I/O last 3 completed shifts: In: 3530.1 [I.V.:3530.1] Out: 4000 [Urine:3000; Other:1000]  Intake/Output this shift:  Total I/O In: 30 [Blood:30] Out: -  Weight change: -1 kg  General adult male in bed in no acute distress     HEENT normocephalic atraumatic extraocular movements intact sclera anicteric Neck supple trachea midline Lungs clear to auscultation bilaterally normal work of breathing at rest on room air Heart S1S2 no rub Abdomen soft nontender distended Extremities 2+ edema lower extremities Psych normal mood and affect Neuro - alert and oriented x 3 provides hx and follows commands Access RIJ nontunneled catheter    Recent Labs  Lab 01/08/23 0500 01/09/23 0452 01/09/23 1132 01/10/23 0333 01/11/23 0401 01/12/23 0400  01/13/23 0400 01/14/23 0403  NA 136 142 142 139 138 135 138 137  K 3.8 4.4 4.1 3.8 3.7 4.5 4.8 3.9  CL 113* 117*  --  114* 111 105 104 100  CO2 16* 12*  --  18* 18* 17* 18* 25  GLUCOSE 214* 156*  --  148* 134* 125* 122* 127*  BUN 102* 119*  --  120* 130* 147* 160* 110*  CREATININE 2.24* 2.77*  --  2.73* 2.95* 3.51* 4.05* 3.17*  ALBUMIN 1.6* 1.6*  --  1.5* 1.6*  --  1.7* 1.5*  CALCIUM 7.4* 7.6*  --  7.3* 7.6* 8.0* 8.3* 7.7*  PHOS 3.7 4.6  --  4.0 4.2  --   --  4.3  AST 79*  --   --   --  50*  --  35  --   ALT 198*  --   --   --  96*  --  81*  --    Liver Function Tests: Recent Labs  Lab 01/08/23 0500 01/09/23 0452 01/11/23 0401 01/13/23 0400 01/14/23 0403  AST 79*  --  50* 35  --   ALT 198*  --  96* 81*  --   ALKPHOS 96  --  69 83  --   BILITOT 0.6  --  0.5 0.6  --   PROT 4.3*  --  4.3* 4.7*  --   ALBUMIN 1.6*   < > 1.6* 1.7* 1.5*   < > = values in this interval not displayed.   CBC: Recent Labs  Lab 01/11/23 1819 01/11/23 2100 01/12/23 0400 01/12/23 1755 01/13/23 0400 01/14/23 0403  WBC 12.0*  --  8.7 8.5 7.9 6.3  HGB 8.4*   < > 7.4* 7.3* 7.6* 6.9*  HCT 25.0*   < > 22.2* 22.4* 22.7* 20.9*  MCV 91.6  --  89.9 92.2 92.3 88.2  PLT 218  --  188 190 183 168   < > = values in this interval not displayed.   Cardiac Enzymes: No results for input(s): "CKTOTAL", "CKMB", "CKMBINDEX", "TROPONINI" in the last 168 hours. CBG: Recent Labs  Lab 01/13/23 1717 01/13/23 1955 01/13/23 2336 01/14/23 0428 01/14/23 0804  GLUCAP 135* 131* 123* 130* 135*    Studies/Results: IR Fluoro Guide CV Line Right  Result Date: 01/13/2023 CLINICAL DATA:  Renal failure, needs  venous access for dialysis EXAM: EXAM RIGHT IJ CATHETER PLACEMENT UNDER ULTRASOUND AND FLUOROSCOPIC GUIDANCE TECHNIQUE: The procedure, risks (including but not limited to bleeding, infection, organ damage, pneumothorax), benefits, and alternatives were explained to the patient. Questions regarding the procedure were  encouraged and answered. The patient understands and consents to the procedure. Patency of the right IJ vein was confirmed with ultrasound with image documentation. An appropriate skin site was determined. Skin site was marked. Region was prepped using maximum barrier technique including cap and mask, sterile gown, sterile gloves, large sterile sheet, and Chlorhexidine as cutaneous antisepsis. The region was infiltrated locally with 1% lidocaine. Under real-time ultrasound guidance, the right IJ vein was accessed with a 21 gauge needle; the needle tip within the vein was confirmed with ultrasound image documentation. The needle exchanged over a 018 guidewire for vascular dilator which allowed advancement of a 16 cm Mahurkar catheter. This was positioned with the tip at the cavoatrial junction. Spot chest radiograph shows good positioning and no pneumothorax. Catheter was flushed and sutured externally with 0-Prolene sutures. Patient tolerated the procedure well. FLUOROSCOPY TIME:  Radiation Exposure Index (as provided by the fluoroscopic device): 1 mGy air Kerma COMPLICATIONS: COMPLICATIONS none IMPRESSION: 1. Technically successful right IJ Mahurkar catheter placement. Electronically Signed   By: Corlis Leak M.D.   On: 01/13/2023 09:29   IR US Guide Vasc Access Right  Result Date: 01/13/2023 CLINICAL DATA:  Renal failure, needs  venous access for dialysis EXAM: EXAM RIGHT IJ CATHETER PLACEMENT UNDER ULTRASOUND AND FLUOROSCOPIC GUIDANCE TECHNIQUE: The procedure, risks (including but not limited to bleeding, infection, organ damage, pneumothorax), benefits, and alternatives were explained to the patient. Questions regarding the procedure were encouraged and answered. The patient understands and consents to the procedure. Patency of the right IJ vein was confirmed with ultrasound with image documentation. An appropriate skin site was determined. Skin site was marked. Region was prepped using maximum barrier  technique including cap and mask, sterile gown, sterile gloves, large sterile sheet, and Chlorhexidine as cutaneous antisepsis. The region was infiltrated locally with 1% lidocaine. Under real-time ultrasound guidance, the right IJ vein was accessed with a 21 gauge needle; the needle tip within the vein was confirmed with ultrasound image documentation. The needle exchanged over a 018 guidewire for vascular dilator which allowed advancement of a 16 cm Mahurkar catheter. This was positioned with the tip at the cavoatrial junction. Spot chest radiograph shows good positioning and no pneumothorax.  Catheter was flushed and sutured externally with 0-Prolene sutures. Patient tolerated the procedure well. FLUOROSCOPY TIME:  Radiation Exposure Index (as provided by the fluoroscopic device): 1 mGy air Kerma COMPLICATIONS: COMPLICATIONS none IMPRESSION: 1. Technically successful right IJ Mahurkar catheter placement. Electronically Signed   By: Corlis Leak M.D.   On: 01/13/2023 09:29    sodium chloride   Intravenous Once   Chlorhexidine Gluconate Cloth  6 each Topical Q0600   feeding supplement  237 mL Oral TID BM   Gerhardt's butt cream   Topical Daily   pantoprazole (PROTONIX) IV  40 mg Intravenous Q12H   sodium chloride flush  3 mL Intravenous Q12H    BMET    Component Value Date/Time   NA 137 01/14/2023 0403   K 3.9 01/14/2023 0403   CL 100 01/14/2023 0403   CO2 25 01/14/2023 0403   GLUCOSE 127 (H) 01/14/2023 0403   BUN 110 (H) 01/14/2023 0403   CREATININE 3.17 (H) 01/14/2023 0403   CALCIUM 7.7 (L) 01/14/2023 0403   GFRNONAA 21 (L) 01/14/2023 0403   GFRAA >60 04/14/2015 1127   CBC    Component Value Date/Time   WBC 6.3 01/14/2023 0403   RBC 2.37 (L) 01/14/2023 0403   HGB 6.9 (LL) 01/14/2023 0403   HCT 20.9 (L) 01/14/2023 0403   PLT 168 01/14/2023 0403   MCV 88.2 01/14/2023 0403   MCH 29.1 01/14/2023 0403   MCHC 33.0 01/14/2023 0403   RDW 16.6 (H) 01/14/2023 0403   LYMPHSABS 0.9 10/30/2022  1654   MONOABS 0.6 10/30/2022 1654   EOSABS 0.3 10/30/2022 1654   BASOSABS 0.0 10/30/2022 1654    Other studies:  Patient s/p colonoscopy and EGD on 10/2 with GI.  This noted deep and severe mucosal ulceration in the rectum and rectosigmoid colon (felt ischemic or infectious per GI) which was noted to not be actively bleeding on exam; also noted noted diverticulosis without active bleeding.  Exam was aborted in the descending colon per GI given lack of bowel prep and the pertinent rectal findings.  GI advised to avoid anticoagulation at this time.  His EGD noted erythematous mucosa in the gastric body which was not actively bleeding at the time of the exam.      Assessment/Plan:   Acute kidney injury on CKD IIIb: Baseline creatinine level around 2.0.  Acute kidney injury likely ischemic ATN due to ex lap surgery/IV contrast/hypotension  Started on HD 9/18 due to inability to wean from vent and worsening renal function.  He was then switched to CRRT on 9/20-24. Course complicated by GI bleed.  Started on HD on 10/5 after nontunneled catheter placement with IR on 10/4 Reconsulted IR for a tunneled catheter for 10/7.  He will be NPO after midnight tonight  Plan for HD again on Monday, 10/7 and anticipate again on Wednesday If he can tolerate sitting in a chair in his room he would like to try sitting in a chair for HD as early as this Wednesday, 10/9 Agree with not intervening on right RAS given that his right kidney is already much smaller than anticipated, discussed with VVS at that time. Likely not much function being contributed from right.   Strict ins and outs and daily weights Note that if contrast is clinically indicated urgently or emergently would then proceed with contrast  Acute mesenteric ischemia: Status post small bowel resection and superior mesenteric artery stent placement.  Followed by vascular surgery.  S/p OR again 9/16 for exlap and  further small bowel resection, abd  closed Metabolic acidosis - s/p bicarb gtt. Starting back HD Vent -dependent respiratory failure: Extubated and on room air and monitoring volume and resp status closely given large amount of inputs required.  Anemia due to acute blood loss: transfuse prn for hgb <7.  PRBC's per primary team. Multiple transfusions on 10/1.  S/p EGD and colonoscopy on 10/2. Getting PRBC's on 10/6.  Acute GI bleed - s/p PRBC's and GI is following.  S/p EGD and colonoscopy as above on 10/2 with severe mucosal ulceration in the rectum and rectosigmoid colon (felt ischemic or infectious per GI) Hypertension - shock resolved.  Avoid hypotension    Severe protein malnutrition - nutrition per primary team Transaminitis - may have been setting of shock - improving   Disposition - continue inpatient monitoring  Estanislado Emms, MD 12:01 PM 01/14/2023

## 2023-01-14 NOTE — Progress Notes (Signed)
4 Days Post-Op   Subjective/Chief Complaint: Patient without complaints.  Hemoglobin drifted from 7.6-6.9.  He is getting 1 unit of blood.  He has no complaints and denies any rectal bleeding today.   Objective: Vital signs in last 24 hours: Temp:  [98 F (36.7 C)-98.5 F (36.9 C)] 98.4 F (36.9 C) (10/06 1040) Pulse Rate:  [77-96] 81 (10/06 1040) Resp:  [15-22] 21 (10/06 1040) BP: (125-164)/(62-82) 152/73 (10/06 1040) SpO2:  [93 %-100 %] 99 % (10/06 1040) Weight:  [99.8 kg-102.4 kg] 99.8 kg (10/06 0427) Last BM Date : 01/13/23  Intake/Output from previous day: 10/05 0701 - 10/06 0700 In: 3530.1 [I.V.:3530.1] Out: 2400 [Urine:1400] Intake/Output this shift: Total I/O In: 30 [Blood:30] Out: -   Gen: alert, NAD Pulm: respirations unlabored  Abd: soft, nondistended, nontender, open midline incision beefy red without drainage  Lab Results:  Recent Labs    01/13/23 0400 01/14/23 0403  WBC 7.9 6.3  HGB 7.6* 6.9*  HCT 22.7* 20.9*  PLT 183 168   BMET Recent Labs    01/13/23 0400 01/14/23 0403  NA 138 137  K 4.8 3.9  CL 104 100  CO2 18* 25  GLUCOSE 122* 127*  BUN 160* 110*  CREATININE 4.05* 3.17*  CALCIUM 8.3* 7.7*   PT/INR No results for input(s): "LABPROT", "INR" in the last 72 hours. ABG No results for input(s): "PHART", "HCO3" in the last 72 hours.  Invalid input(s): "PCO2", "PO2"  Studies/Results: IR Fluoro Guide CV Line Right  Result Date: 01/13/2023 CLINICAL DATA:  Renal failure, needs  venous access for dialysis EXAM: EXAM RIGHT IJ CATHETER PLACEMENT UNDER ULTRASOUND AND FLUOROSCOPIC GUIDANCE TECHNIQUE: The procedure, risks (including but not limited to bleeding, infection, organ damage, pneumothorax), benefits, and alternatives were explained to the patient. Questions regarding the procedure were encouraged and answered. The patient understands and consents to the procedure. Patency of the right IJ vein was confirmed with ultrasound with image  documentation. An appropriate skin site was determined. Skin site was marked. Region was prepped using maximum barrier technique including cap and mask, sterile gown, sterile gloves, large sterile sheet, and Chlorhexidine as cutaneous antisepsis. The region was infiltrated locally with 1% lidocaine. Under real-time ultrasound guidance, the right IJ vein was accessed with a 21 gauge needle; the needle tip within the vein was confirmed with ultrasound image documentation. The needle exchanged over a 018 guidewire for vascular dilator which allowed advancement of a 16 cm Mahurkar catheter. This was positioned with the tip at the cavoatrial junction. Spot chest radiograph shows good positioning and no pneumothorax. Catheter was flushed and sutured externally with 0-Prolene sutures. Patient tolerated the procedure well. FLUOROSCOPY TIME:  Radiation Exposure Index (as provided by the fluoroscopic device): 1 mGy air Kerma COMPLICATIONS: COMPLICATIONS none IMPRESSION: 1. Technically successful right IJ Mahurkar catheter placement. Electronically Signed   By: Corlis Leak M.D.   On: 01/13/2023 09:29   IR US Guide Vasc Access Right  Result Date: 01/13/2023 CLINICAL DATA:  Renal failure, needs  venous access for dialysis EXAM: EXAM RIGHT IJ CATHETER PLACEMENT UNDER ULTRASOUND AND FLUOROSCOPIC GUIDANCE TECHNIQUE: The procedure, risks (including but not limited to bleeding, infection, organ damage, pneumothorax), benefits, and alternatives were explained to the patient. Questions regarding the procedure were encouraged and answered. The patient understands and consents to the procedure. Patency of the right IJ vein was confirmed with ultrasound with image documentation. An appropriate skin site was determined. Skin site was marked. Region was prepped using maximum barrier technique including  cap and mask, sterile gown, sterile gloves, large sterile sheet, and Chlorhexidine as cutaneous antisepsis. The region was infiltrated  locally with 1% lidocaine. Under real-time ultrasound guidance, the right IJ vein was accessed with a 21 gauge needle; the needle tip within the vein was confirmed with ultrasound image documentation. The needle exchanged over a 018 guidewire for vascular dilator which allowed advancement of a 16 cm Mahurkar catheter. This was positioned with the tip at the cavoatrial junction. Spot chest radiograph shows good positioning and no pneumothorax. Catheter was flushed and sutured externally with 0-Prolene sutures. Patient tolerated the procedure well. FLUOROSCOPY TIME:  Radiation Exposure Index (as provided by the fluoroscopic device): 1 mGy air Kerma COMPLICATIONS: COMPLICATIONS none IMPRESSION: 1. Technically successful right IJ Mahurkar catheter placement. Electronically Signed   By: Corlis Leak M.D.   On: 01/13/2023 09:29    Anti-infectives: Anti-infectives (From admission, onward)    Start     Dose/Rate Route Frequency Ordered Stop   12/30/22 1800  vancomycin (VANCOCIN) IVPB 1000 mg/200 mL premix        1,000 mg 200 mL/hr over 60 Minutes Intravenous Every 24 hours 12/29/22 1844 01/03/23 1946   12/30/22 0600  vancomycin (VANCOCIN) IVPB 1000 mg/200 mL premix  Status:  Discontinued        1,000 mg 200 mL/hr over 60 Minutes Intravenous Every 12 hours 12/29/22 1842 12/29/22 1844   12/29/22 2200  meropenem (MERREM) 1 g in sodium chloride 0.9 % 100 mL IVPB  Status:  Discontinued        1 g 200 mL/hr over 30 Minutes Intravenous Every 12 hours 12/29/22 1705 12/29/22 1842   12/29/22 1930  meropenem (MERREM) 1 g in sodium chloride 0.9 % 100 mL IVPB        1 g 200 mL/hr over 30 Minutes Intravenous Every 8 hours 12/29/22 1842 01/04/23 0700   12/29/22 1800  vancomycin (VANCOREADY) IVPB 2000 mg/400 mL        2,000 mg 200 mL/hr over 120 Minutes Intravenous  Once 12/29/22 1705 12/29/22 1958   12/23/22 1230  piperacillin-tazobactam (ZOSYN) IVPB 2.25 g        2.25 g 100 mL/hr over 30 Minutes Intravenous Every 8  hours 12/23/22 1139 12/28/22 2109   12/22/22 0000  piperacillin-tazobactam (ZOSYN) IVPB 3.375 g  Status:  Discontinued        3.375 g 12.5 mL/hr over 240 Minutes Intravenous Every 8 hours 12/21/22 2331 12/23/22 1139       Assessment/Plan: s/p Procedure(s): ESOPHAGOGASTRODUODENOSCOPY (EGD) WITH PROPOFOL (N/A) COLONOSCOPY (N/A) BIOPSY 67 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 Karin Lieu, exploratory laparotomy and small bowel resection Dr. Janee Morn -S/p re-exploration, replace abthera FB 12/23/22   -S/p re-exploration, small bowel resection with anastomosis x2, assessment of bowel perfusion with ICG, abdominal closure 9/16 Dr. Andrey Campanile     - [facility] s/p egd/colonoscopy 10/2 that revealed severe ulceration in the rectum and recto- sigmoid colon, no active bleeding at the time. Biopsy showed ulceration with inflammatory changes consistent with ischemia and no viral cytopathic effect, stains pending - No further rectal bleeding.  Hemoglobin s down overnight-6.8  Transfuse 1 unit   Would hold off on anticoagulation for now given all of his bleeding issues. Given ongoing anemic issues, would hold off on anticoagulation indefinitely since the risk of bleeding outweigh the benefits in the circumstance. - daily wet to dry dressing changes to midline abdominal wound   FEN: reg, TPN VTE:  ASA PR, bivalirudin on hold currently  ID: No current abx  LOS: 25 days    Dortha Schwalbe MD 01/14/2023

## 2023-01-14 NOTE — Progress Notes (Signed)
PROGRESS NOTE    [name] [name]  ZOX:[id number] DOB: [birth year] DOA: 12/20/2022 PCP: [name], MD    Brief Narrative:  67 year old male with past medical history as below, which is significant for intestinal stromal tumor removal in 2022, known mesenteric stenosis, GERD, hypertension, and hyperlipidemia. In the few weeks prior to presentation on 9/11 he had been evaluated at several campuses for abdominal pain with noncontrasted CTs of the abdomen which were unrevealing. He continued to have abdominal pain, weight loss, and poor p.o. intake. He presented [name] on 9/11 and was admitted to the hospitalist for abdominal pain with concern for superior mesenteric artery thrombus. He was started on heparin infusion. Vascular surgery was consulted and ultimately a CT angiogram of the abdomen and pelvis was done 9/12 demonstrating acute occlusion of the distal SMA as well as occlusion of the inferior mesenteric artery. He was taken to the operating room emergently and and was found to have an necrotic area of the jejunum. This was resected, and SMA stent was placed, and the patient was left with an open abdomen. He also remained on the mechanical ventilator postoperatively and PCCM was asked to admit for ventilator management.  9/11 admitted with abdominal pain 9/12 CT demonstrating mesenteric occlusion, bowel resection and stent placed, to ICU on vent. Left in discontinuity  9/14 back to OR. Left in discontinuity as still concern about bowel integrity 9/16 back to OR  s sig areas of ischemia w/ 2 areas of perforation, another 66cm section of jejunum was resected and area of discontinuity was reanastomosed. Changed to argatroban post-op as unable to get heparin levels detectable  9/17 dropping RASS goal to 0 to -1, assessing for readiness to wean, serum creatinine and BUN still climbing some but making urine 9/18 iHD  9/20 rising pressor need, anemia: CTA abd: nothing emergent, transfused 9/22  extubated 9/25 poor tolerance of NGT clamping. Abx ended  9/26 trying clamping again. Adding PRN antihypertensives. Temp incr a bit, 100.6. holding off on HD and tunneled HD cath given incr UOP  9/26 transferred out of ICU 9/29 Argatroban stopped and held due to rectal bleeding 9/30 Hgb 6.1 at 1250, 2u PRBC ordered then had large bloody BM again overnight, repeat H/H pending. Was hypotensive initially to 70-80s but responded to 200cc IVF. Likely needs more blood, TRH to follow.  9/30 PCCM asked to see incase has ongoing bleeding and hypotension and needs ICU care again 10/1 ICU transfer for GI bleed. AC held.  10/2 endoscopy > no bleedin source identified 10/2 colonoscopy > deep ulcerations encompassing 80% on the observed bowel with stigmata of recent bleeding.   Assessment and Plan:  Acute mesenteric ischemia  -s/p thrombectomy and SMA stent placement with ex lap and SB resection 9/12 followed by re-exploration x 2 on 9/14 and 9/16.  -Completed course of vanc/mero on 9/25. Per vascular surgery the majority of his bowel is surviving off a 3.35mm stent. - management per vascular surgery - avoid hypotension - AC when able from a GIB standpoint-- per surgery/GI-- will hold on this until Hbg stable -daily wet to dry dressing changes to midline abdominal wound per GS   Postoperative GIB, ABLA ongoing Colonoscopy 10/2 showeing deeps ulcerations in majority of the colon with evidence of bleeding likely due to ischemia.  - 1 unit PRBC 10/3  and again 10/6 - trend hgb - AC on hold- see above   Afib now NSR -  on Amio gtt when tx out of unit--  change to PO? - anticoagulation as above   HTN/HLD - home therapies on hold    AKI on CKD 3a, acidemia - nephro following - HD started   Physical deconditioning  - PT/OT    Nutrition - TPN - diet resumed on 10/3 by Dr. Katrinka Blazing  Obesity Estimated body mass index is 30.69 kg/m as calculated from the following:   Height as of this encounter: 5'  11" (1.803 m).   Weight as of this encounter: 99.8 kg.   Poor overall prognosis  Needs OOB to chair -would try pain meds prior to HD   DVT prophylaxis: Place and maintain sequential compression device Start: 12/22/22 0130    Code Status: Full Code Family Communication: wife at bedside 10/6  Disposition Plan:  Level of care: Progressive Status is: Inpatient Remains inpatient appropriate     Consultants:  Vascular GI Nephrology PCCM   Subjective: Had pain during HD and asking for pain meds but was told not available   Objective: Vitals:   01/14/23 1035 01/14/23 1040 01/14/23 1159 01/14/23 1200  BP: (!) 152/73 (!) 152/73 (!) 169/75 (!) 168/75  Pulse: 80 81  77  Resp: (!) 21 (!) 21  17  Temp: 98.4 F (36.9 C) 98.4 F (36.9 C) 98.5 F (36.9 C)   TempSrc:  Oral    SpO2: 99% 99%  95%  Weight:      Height:        Intake/Output Summary (Last 24 hours) at 01/14/2023 1236 Last data filed at 01/14/2023 1020 Gross per 24 hour  Intake 3560.12 ml  Output 2400 ml  Net 1160.12 ml   Filed Weights   01/13/23 1400 01/13/23 1711 01/14/23 0427  Weight: (S) 102.4 kg (S) 101.6 kg 99.8 kg    Examination:   General: Appearance:    Ill appearing Obese male in no acute distress     Lungs:     respirations unlabored  Heart:    Normal heart rate. .    MS:   All extremities are intact.    Neurologic:   Awake, alert       Data Reviewed: I have personally reviewed following labs and imaging studies  CBC: Recent Labs  Lab 01/11/23 1819 01/11/23 2100 01/12/23 0400 01/12/23 1755 01/13/23 0400 01/14/23 0403  WBC 12.0*  --  8.7 8.5 7.9 6.3  HGB 8.4* 7.7* 7.4* 7.3* 7.6* 6.9*  HCT 25.0* 22.9* 22.2* 22.4* 22.7* 20.9*  MCV 91.6  --  89.9 92.2 92.3 88.2  PLT 218  --  188 190 183 168   Basic Metabolic Panel: Recent Labs  Lab 01/08/23 0500 01/09/23 0135 01/09/23 0452 01/09/23 1132 01/10/23 0333 01/11/23 0401 01/12/23 0400 01/13/23 0400 01/14/23 0403  NA 136  --   142   < > 139 138 135 138 137  K 3.8  --  4.4   < > 3.8 3.7 4.5 4.8 3.9  CL 113*  --  117*  --  114* 111 105 104 100  CO2 16*  --  12*  --  18* 18* 17* 18* 25  GLUCOSE 214*  --  156*  --  148* 134* 125* 122* 127*  BUN 102*  --  119*  --  120* 130* 147* 160* 110*  CREATININE 2.24*  --  2.77*  --  2.73* 2.95* 3.51* 4.05* 3.17*  CALCIUM 7.4*  --  7.6*  --  7.3* 7.6* 8.0* 8.3* 7.7*  MG 2.0 2.0  --   --  1.9  2.2 2.3  --  2.0  PHOS 3.7  --  4.6  --  4.0 4.2  --   --  4.3   < > = values in this interval not displayed.   GFR: Estimated Creatinine Clearance: 26.8 mL/min (A) (by C-G formula based on SCr of 3.17 mg/dL (H)). Liver Function Tests: Recent Labs  Lab 01/08/23 0500 01/09/23 0452 01/10/23 0333 01/11/23 0401 01/13/23 0400 01/14/23 0403  AST 79*  --   --  50* 35  --   ALT 198*  --   --  96* 81*  --   ALKPHOS 96  --   --  69 83  --   BILITOT 0.6  --   --  0.5 0.6  --   PROT 4.3*  --   --  4.3* 4.7*  --   ALBUMIN 1.6* 1.6* 1.5* 1.6* 1.7* 1.5*   No results for input(s): "LIPASE", "AMYLASE" in the last 168 hours. No results for input(s): "AMMONIA" in the last 168 hours. Coagulation Profile: No results for input(s): "INR", "PROTIME" in the last 168 hours. Cardiac Enzymes: No results for input(s): "CKTOTAL", "CKMB", "CKMBINDEX", "TROPONINI" in the last 168 hours. BNP (last 3 results) Recent Labs    07/11/22 1451  PROBNP 7.0   HbA1C: No results for input(s): "HGBA1C" in the last 72 hours. CBG: Recent Labs  Lab 01/13/23 1955 01/13/23 2336 01/14/23 0428 01/14/23 0804 01/14/23 1155  GLUCAP 131* 123* 130* 135* 127*   Lipid Profile: No results for input(s): "CHOL", "HDL", "LDLCALC", "TRIG", "CHOLHDL", "LDLDIRECT" in the last 72 hours. Thyroid Function Tests: No results for input(s): "TSH", "T4TOTAL", "FREET4", "T3FREE", "THYROIDAB" in the last 72 hours. Anemia Panel: No results for input(s): "VITAMINB12", "FOLATE", "FERRITIN", "TIBC", "IRON", "RETICCTPCT" in the last 72  hours. Sepsis Labs: Recent Labs  Lab 01/09/23 1527 01/09/23 1731  LATICACIDVEN 1.0 1.2    No results found for this or any previous visit (from the past 240 hour(s)).       Radiology Studies: IR Fluoro Guide CV Line Right  Result Date: 01/13/2023 CLINICAL DATA:  Renal failure, needs  venous access for dialysis EXAM: EXAM RIGHT IJ CATHETER PLACEMENT UNDER ULTRASOUND AND FLUOROSCOPIC GUIDANCE TECHNIQUE: The procedure, risks (including but not limited to bleeding, infection, organ damage, pneumothorax), benefits, and alternatives were explained to the patient. Questions regarding the procedure were encouraged and answered. The patient understands and consents to the procedure. Patency of the right IJ vein was confirmed with ultrasound with image documentation. An appropriate skin site was determined. Skin site was marked. Region was prepped using maximum barrier technique including cap and mask, sterile gown, sterile gloves, large sterile sheet, and Chlorhexidine as cutaneous antisepsis. The region was infiltrated locally with 1% lidocaine. Under real-time ultrasound guidance, the right IJ vein was accessed with a 21 gauge needle; the needle tip within the vein was confirmed with ultrasound image documentation. The needle exchanged over a 018 guidewire for vascular dilator which allowed advancement of a 16 cm Mahurkar catheter. This was positioned with the tip at the cavoatrial junction. Spot chest radiograph shows good positioning and no pneumothorax. Catheter was flushed and sutured externally with 0-Prolene sutures. Patient tolerated the procedure well. FLUOROSCOPY TIME:  Radiation Exposure Index (as provided by the fluoroscopic device): 1 mGy air Kerma COMPLICATIONS: COMPLICATIONS none IMPRESSION: 1. Technically successful right IJ Mahurkar catheter placement. Electronically Signed   By: Corlis Leak M.D.   On: 01/13/2023 09:29   IR US Guide Vasc Access Right  Result  Date: 01/13/2023 CLINICAL  DATA:  Renal failure, needs  venous access for dialysis EXAM: EXAM RIGHT IJ CATHETER PLACEMENT UNDER ULTRASOUND AND FLUOROSCOPIC GUIDANCE TECHNIQUE: The procedure, risks (including but not limited to bleeding, infection, organ damage, pneumothorax), benefits, and alternatives were explained to the patient. Questions regarding the procedure were encouraged and answered. The patient understands and consents to the procedure. Patency of the right IJ vein was confirmed with ultrasound with image documentation. An appropriate skin site was determined. Skin site was marked. Region was prepped using maximum barrier technique including cap and mask, sterile gown, sterile gloves, large sterile sheet, and Chlorhexidine as cutaneous antisepsis. The region was infiltrated locally with 1% lidocaine. Under real-time ultrasound guidance, the right IJ vein was accessed with a 21 gauge needle; the needle tip within the vein was confirmed with ultrasound image documentation. The needle exchanged over a 018 guidewire for vascular dilator which allowed advancement of a 16 cm Mahurkar catheter. This was positioned with the tip at the cavoatrial junction. Spot chest radiograph shows good positioning and no pneumothorax. Catheter was flushed and sutured externally with 0-Prolene sutures. Patient tolerated the procedure well. FLUOROSCOPY TIME:  Radiation Exposure Index (as provided by the fluoroscopic device): 1 mGy air Kerma COMPLICATIONS: COMPLICATIONS none IMPRESSION: 1. Technically successful right IJ Mahurkar catheter placement. Electronically Signed   By: Corlis Leak M.D.   On: 01/13/2023 09:29        Scheduled Meds:  sodium chloride   Intravenous Once   Chlorhexidine Gluconate Cloth  6 each Topical Q0600   feeding supplement  237 mL Oral TID BM   Gerhardt's butt cream   Topical Daily   pantoprazole (PROTONIX) IV  40 mg Intravenous Q12H   sodium chloride flush  3 mL Intravenous Q12H   Continuous Infusions:  albumin  human Stopped (01/01/23 1020)   amiodarone 30 mg/hr (01/14/23 0428)   anticoagulant sodium citrate     TPN ADULT (ION) 80 mL/hr at 01/14/23 0325   TPN ADULT (ION)       LOS: 25 days    Time spent: 45 minutes spent on chart review, discussion with nursing staff, consultants, updating family and interview/physical exam; more than 50% of that time was spent in counseling and/or coordination of care.    Joseph Art, DO [facility] Available via Epic secure chat 7am-7pm After these hours, please refer to coverage provider listed on [email or web address] 01/14/2023, 12:36 PM

## 2023-01-14 NOTE — Progress Notes (Signed)
TRH night cross cover note:   I was notified by RN of the patient's hemoglobin this morning of 6.9 compared to most recent prior value of 7.6 yesterday morning.  Vital signs appear stable, with most recent blood pressure 147/62, and heart rates in the 70s.  No report of any new symptoms at this time.  The patient has required multiple PRBC transfusions over the course of this hospitalization.   He will also need an updated type and screen.  I subsequent placed orders for an updated type and screen as well as transfusion of 1 unit PRBC over 3 hours.  I have also ordered a repeat H&H to be checked following completion of transfusion of this 1 unit PRBC.     Newton Pigg, DO Hospitalist

## 2023-01-15 ENCOUNTER — Encounter (HOSPITAL_COMMUNITY): Payer: Self-pay | Admitting: Internal Medicine

## 2023-01-15 DIAGNOSIS — K551 Chronic vascular disorders of intestine: Secondary | ICD-10-CM | POA: Diagnosis not present

## 2023-01-15 LAB — BPAM RBC
Blood Product Expiration Date: 202411012359
ISSUE DATE / TIME: 202410061014
Unit Type and Rh: 6200

## 2023-01-15 LAB — GLUCOSE, CAPILLARY
Glucose-Capillary: 126 mg/dL — ABNORMAL HIGH (ref 70–99)
Glucose-Capillary: 136 mg/dL — ABNORMAL HIGH (ref 70–99)
Glucose-Capillary: 132 mg/dL — ABNORMAL HIGH (ref 70–99)
Glucose-Capillary: 129 mg/dL — ABNORMAL HIGH (ref 70–99)
Glucose-Capillary: 148 mg/dL — ABNORMAL HIGH (ref 70–99)
Glucose-Capillary: 125 mg/dL — ABNORMAL HIGH (ref 70–99)
Glucose-Capillary: 125 mg/dL — ABNORMAL HIGH (ref 70–99)

## 2023-01-15 LAB — TRIGLYCERIDES: Triglycerides: 100 mg/dL (ref <150)

## 2023-01-15 LAB — COMPREHENSIVE METABOLIC PANEL
ALT: 56 U/L — ABNORMAL HIGH (ref 0–44)
AST: 24 U/L (ref 15–41)
Albumin: 1.6 g/dL — ABNORMAL LOW (ref 3.5–5.0)
Alkaline Phosphatase: 75 U/L (ref 38–126)
Anion gap: 13 (ref 5–15)
BUN: 131 mg/dL — ABNORMAL HIGH (ref 8–23)
CO2: 28 mmol/L (ref 22–32)
Calcium: 8.1 mg/dL — ABNORMAL LOW (ref 8.9–10.3)
Chloride: 99 mmol/L (ref 98–111)
Creatinine, Ser: 4.13 mg/dL — ABNORMAL HIGH (ref 0.61–1.24)
GFR, Estimated: 15 mL/min — ABNORMAL LOW (ref >60)
Glucose, Bld: 138 mg/dL — ABNORMAL HIGH (ref 70–99)
Potassium: 4.1 mmol/L (ref 3.5–5.1)
Sodium: 140 mmol/L (ref 135–145)
Total Bilirubin: 0.4 mg/dL (ref 0.3–1.2)
Total Protein: 4.8 g/dL — ABNORMAL LOW (ref 6.5–8.1)

## 2023-01-15 LAB — TYPE AND SCREEN
ABO/RH(D): A POS
Antibody Screen: NEGATIVE
Unit division: 0

## 2023-01-15 LAB — CBC
HCT: 24.1 % — ABNORMAL LOW (ref 39.0–52.0)
Hemoglobin: 8.1 g/dL — ABNORMAL LOW (ref 13.0–17.0)
MCH: 30.6 pg (ref 26.0–34.0)
MCHC: 33.6 g/dL (ref 30.0–36.0)
MCV: 90.9 fL (ref 80.0–100.0)
Platelets: 172 10*3/uL (ref 150–400)
RBC: 2.65 MIL/uL — ABNORMAL LOW (ref 4.22–5.81)
RDW: 16.1 % — ABNORMAL HIGH (ref 11.5–15.5)
WBC: 5.9 10*3/uL (ref 4.0–10.5)
nRBC: 0 % (ref 0.0–0.2)

## 2023-01-15 LAB — SURGICAL PATHOLOGY

## 2023-01-15 LAB — PHOSPHORUS: Phosphorus: 4.5 mg/dL (ref 2.5–4.6)

## 2023-01-15 LAB — MAGNESIUM: Magnesium: 2.2 mg/dL (ref 1.7–2.4)

## 2023-01-15 MED ORDER — BOOST / RESOURCE BREEZE PO LIQD CUSTOM
1.0000 | Freq: Three times a day (TID) | ORAL | Status: DC
  Administered 2023-01-18 – 2023-01-19 (×2): 1 via ORAL

## 2023-01-15 MED ORDER — HYDRALAZINE HCL 25 MG PO TABS
25.0000 mg | ORAL_TABLET | Freq: Three times a day (TID) | ORAL | Status: DC
  Administered 2023-01-15 – 2023-01-17 (×5): 25 mg via ORAL
  Filled 2023-01-15 (×5): qty 1

## 2023-01-15 MED ORDER — TRACE MINERALS CU-MN-SE-ZN 300-55-60-3000 MCG/ML IV SOLN
INTRAVENOUS | Status: AC
  Filled 2023-01-15: qty 934.4

## 2023-01-15 MED ORDER — PROSOURCE PLUS PO LIQD
30.0000 mL | Freq: Two times a day (BID) | ORAL | Status: DC
  Administered 2023-01-15 – 2023-01-19 (×3): 30 mL via ORAL
  Filled 2023-01-15 (×4): qty 30

## 2023-01-15 NOTE — Care Management Important Message (Signed)
Important Message  Patient Details  Name: [name] [name] MRN: [id number] Date of Birth: [birth year]   Important Message Given:  Yes - Medicare IM     Sherilyn Banker 01/15/2023, 2:29 PM

## 2023-01-15 NOTE — Progress Notes (Signed)
PHARMACY - TOTAL PARENTERAL NUTRITION CONSULT NOTE  Indication:  SMA syndrome w associated weight loss  Patient Measurements: Height: 5\' 11"  (180.3 cm) Weight: 100.5 kg (221 lb 9 oz) IBW/kg (Calculated) : 75.3 TPN AdjBW (KG): 80.4 Body mass index is 30.9 kg/m. Usual Weight: 95 kg, has lost 50 lbs recently   Assessment:  67 yo M admitted with abdominal pain, weight loss, and poor PO intake. 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 He is now s/p SMA stent placement. He was initially left is discontinuity with open abdomen and was closed on 9/16. TPN was initiated 9/14.   Pt evaluated by speech therapy 9/29 - was able to tolerate trials of ice chips, thin liquid, and puree w/o overt signs or aspiration or difficulty swallowing.  MBS completed 9/30 - cognitive based oral dysphagia; recommended for trial of full liquids and upgrade as mentation allows. Requires full assistance for feeding until mentation improves.   Glucose / Insulin: A1c 6.3% - CBGs < 140; off insulin Electrolytes: Na 140, K 4.1, CO2 28 (max acetate in TPN), CoCa 10.02, Phos 4.5, Others wnl   Renal: off CRRT 9/24, iHD initiated, last HD 10/05. BUN 131 (possibly high due to GIB though more specific to upper GIB) Hepatic: alk phos/tbili wnl, AST down 24, ALT down 56, albumin 1.6, TG 100 (wnl) Intake / Output; MIVF: UOP 0.4 mL/kg/hr, LBM 10/5 per charting (rectal bleed resolved). RBC 345 mL 10/6 due to drop in Hgb (down to 6.9)  GI Imaging:  9/20 CTA: improved ostial stenosis post stenting, thrombus/occlusion of SMA/ileocolic artery improving, surgical site has increased dilatation, L lung nodule GI Surgeries / Procedures:  9/14 re-ex lap, left in discontinuity with plans for re-exploration  9/16 re-ex lap, SBR with anastomosis x2, abdominal closure 10/2 EGD: gastric erythematous mucosa, no active bleeding  10/2 Colo: Deep and severe mucosal ulceration in the  rectum and in the recto-sigmoid colon, likely ischemic or infectious, not actively bleeding; Exam aborted in the descending colon 10/7 tunneled catheter placement  Central access: CVC [facility] 12/23/22 TPN start date: 12/23/22  Nutritional Goals: Goal concentrated TPN rate is 80 mL/hr (provides 140 g AA and 2400 kCal per day) Goal un-concentrated TPN is 100 mL/hr (provides 140g AA and 2253 kCal per day)  RD Estimated Needs Total Energy Estimated Needs: 2300-2500 kcals Total Protein Estimated Needs: 140 -150 g Total Fluid Estimated Needs: >/= 2.3 L  Current Nutrition:  TPN 10/2 CLD  10/3: (starting calorie count) regular diet, tolerating but not eating much 10/4-10/6: calorie count completed by RD, met < 50% calorie goals per RD note. Continue TPN. 10/7: of note NPO for procedure   Plan:  Continue concentrated TPN at goal rate of 80 ml/hr to meet 100% of needs Electrolytes in TPN: increase Na 100 mEq/L (add 9/30 given Na trend), K 20 mEq/L, Ca 5 mEq/L, Mg 7 mEq/L, Phos 0 mmol/L, Cl:Ac - max acetate.  Add standard MVI and trace elements to TPN Monitor TPN labs on Mon/Thurs Additional IVF per nephrology, f/u HD schedule and tunneled catheter placement (10/7) F/u HD scheduled for 10/7 & 10/9 F/u PO intake/calorie count and wean TPN as able   Thank you for allowing pharmacy to be a part of this patient's care.  Laqueta Jean PharmD Candidate 01/15/2023 11:11 AM

## 2023-01-15 NOTE — Progress Notes (Signed)
Inpatient Rehab Admissions Coordinator:    CIR following. PT. Currently not stable or participating at a level to tolerate the intensity of CIR. Will follow for a few more days, but if Pt. Does not progress, he may need LTACH vs SNF.   Megan Salon, MS, CCC-SLP Rehab Admissions Coordinator  [phone] (celll) [phone] (office)

## 2023-01-15 NOTE — Progress Notes (Signed)
PROGRESS NOTE    [name] [name]  ZOX:[id number] DOB: [birth year] DOA: 12/20/2022 PCP: [name], MD    Brief Narrative:  67 year old male with past medical history as below, which is significant for intestinal stromal tumor removal in 2022, known mesenteric stenosis, GERD, hypertension, and hyperlipidemia. In the few weeks prior to presentation on 9/11 he had been evaluated at several campuses for abdominal pain with noncontrasted CTs of the abdomen which were unrevealing. He continued to have abdominal pain, weight loss, and poor p.o. intake. He presented [name] on 9/11 and was admitted to the hospitalist for abdominal pain with concern for superior mesenteric artery thrombus. He was started on heparin infusion. Vascular surgery was consulted and ultimately a CT angiogram of the abdomen and pelvis was done 9/12 demonstrating acute occlusion of the distal SMA as well as occlusion of the inferior mesenteric artery. He was taken to the operating room emergently and and was found to have an necrotic area of the jejunum. This was resected, and SMA stent was placed, and the patient was left with an open abdomen. He also remained on the mechanical ventilator postoperatively and PCCM was asked to admit for ventilator management.  9/11 admitted with abdominal pain 9/12 CT demonstrating mesenteric occlusion, bowel resection and stent placed, to ICU on vent. Left in discontinuity  9/14 back to OR. Left in discontinuity as still concern about bowel integrity 9/16 back to OR  s sig areas of ischemia w/ 2 areas of perforation, another 66cm section of jejunum was resected and area of discontinuity was reanastomosed. Changed to argatroban post-op as unable to get heparin levels detectable  9/17 dropping RASS goal to 0 to -1, assessing for readiness to wean, serum creatinine and BUN still climbing some but making urine 9/18 iHD  9/20 rising pressor need, anemia: CTA abd: nothing emergent, transfused 9/22  extubated 9/25 poor tolerance of NGT clamping. Abx ended  9/26 trying clamping again. Adding PRN antihypertensives. Temp incr a bit, 100.6. holding off on HD and tunneled HD cath given incr UOP  9/26 transferred out of ICU 9/29 Argatroban stopped and held due to rectal bleeding 9/30 Hgb 6.1 at 1250, 2u PRBC ordered then had large bloody BM again overnight, repeat H/H pending. Was hypotensive initially to 70-80s but responded to 200cc IVF. Likely needs more blood, TRH to follow.  9/30 PCCM asked to see incase has ongoing bleeding and hypotension and needs ICU care again 10/1 ICU transfer for GI bleed. AC held.  10/2 endoscopy > no bleedin source identified 10/2 colonoscopy > deep ulcerations encompassing 80% on the observed bowel with stigmata of recent bleeding.   Assessment and Plan:  Acute mesenteric ischemia  -s/p thrombectomy and SMA stent placement with ex lap and SB resection 9/12 followed by re-exploration x 2 on 9/14 and 9/16.  -Completed course of vanc/mero on 9/25. Per vascular surgery the majority of his bowel is surviving off a 3.16mm stent. - management per vascular surgery - avoid hypotension - AC when able from a GIB standpoint-- per surgery/GI-- will hold on this until Hbg stable and vascular in agreement -daily wet to dry dressing changes to midline abdominal wound per GS   Postoperative GIB, ABLA ongoing Colonoscopy 10/2 showeing deeps ulcerations in majority of the colon with evidence of bleeding likely due to ischemia.  - 1 unit PRBC 10/3  and again 10/6 - trend hgb - AC on hold- see above   Afib now NSR -  on Amio gtt when  tx out of unit-- changed to PO - anticoagulation as above   HTN/HLD - home therapies on hold    AKI on CKD 3a, acidemia - nephro following - HD started- getting tunneled catheter   Physical deconditioning  - PT/OT    Nutrition - wean off TPN - diet resumed on 10/3 by Dr. Katrinka Blazing  Obesity Estimated body mass index is 30.9 kg/m as  calculated from the following:   Height as of this encounter: 5\' 11"  (1.803 m).   Weight as of this encounter: 100.5 kg.   Poor overall prognosis  Needs OOB to chair -would try pain meds prior to HD   DVT prophylaxis: Place and maintain sequential compression device Start: 01/14/23 1302 Place and maintain sequential compression device Start: 12/22/22 0130    Code Status: Full Code Family Communication: wife at bedside 10/7  Disposition Plan:  Level of care: Progressive Status is: Inpatient Remains inpatient appropriate     Consultants:  Vascular GI Nephrology PCCM   Subjective: NPO right now for tunneled catheter   Objective: Vitals:   01/14/23 1635 01/14/23 2022 01/15/23 0440 01/15/23 0816  BP: (!) 159/78 (!) 150/65 (!) 164/75 (!) 156/82  Pulse: 82 82 83 79  Resp: 15 20 19 14   Temp: 98.5 F (36.9 C) 98.1 F (36.7 C) 97.7 F (36.5 C) 97.9 F (36.6 C)  TempSrc: Oral Oral Oral Oral  SpO2: 98% 97% 100% 98%  Weight:   100.5 kg   Height:        Intake/Output Summary (Last 24 hours) at 01/15/2023 1110 Last data filed at 01/15/2023 0846 Gross per 24 hour  Intake 2332.93 ml  Output 1550 ml  Net 782.93 ml   Filed Weights   01/13/23 1711 01/14/23 0427 01/15/23 0440  Weight: (S) 101.6 kg 99.8 kg 100.5 kg    Examination:   General: Appearance:    Ill appearing Obese male in no acute distress     Lungs:     respirations unlabored  Heart:    Normal heart rate. .    MS:   All extremities are intact.    Neurologic:   Awake, alert       Data Reviewed: I have personally reviewed following labs and imaging studies  CBC: Recent Labs  Lab 01/12/23 0400 01/12/23 1755 01/13/23 0400 01/14/23 0403 01/14/23 1620 01/15/23 0520  WBC 8.7 8.5 7.9 6.3  --  5.9  HGB 7.4* 7.3* 7.6* 6.9* 7.5* 8.1*  HCT 22.2* 22.4* 22.7* 20.9* 22.7* 24.1*  MCV 89.9 92.2 92.3 88.2  --  90.9  PLT 188 190 183 168  --  172   Basic Metabolic Panel: Recent Labs  Lab 01/09/23 0452  01/09/23 1132 01/10/23 0333 01/11/23 0401 01/12/23 0400 01/13/23 0400 01/14/23 0403 01/15/23 0520  NA 142   < > 139 138 135 138 137 140  K 4.4   < > 3.8 3.7 4.5 4.8 3.9 4.1  CL 117*  --  114* 111 105 104 100 99  CO2 12*  --  18* 18* 17* 18* 25 28  GLUCOSE 156*  --  148* 134* 125* 122* 127* 138*  BUN 119*  --  120* 130* 147* 160* 110* 131*  CREATININE 2.77*  --  2.73* 2.95* 3.51* 4.05* 3.17* 4.13*  CALCIUM 7.6*  --  7.3* 7.6* 8.0* 8.3* 7.7* 8.1*  MG  --   --  1.9 2.2 2.3  --  2.0 2.2  PHOS 4.6  --  4.0 4.2  --   --  4.3 4.5   < > = values in this interval not displayed.   GFR: Estimated Creatinine Clearance: 20.7 mL/min (A) (by C-G formula based on SCr of 4.13 mg/dL (H)). Liver Function Tests: Recent Labs  Lab 01/10/23 0333 01/11/23 0401 01/13/23 0400 01/14/23 0403 01/15/23 0520  AST  --  50* 35  --  24  ALT  --  96* 81*  --  56*  ALKPHOS  --  69 83  --  75  BILITOT  --  0.5 0.6  --  0.4  PROT  --  4.3* 4.7*  --  4.8*  ALBUMIN 1.5* 1.6* 1.7* 1.5* 1.6*   No results for input(s): "LIPASE", "AMYLASE" in the last 168 hours. No results for input(s): "AMMONIA" in the last 168 hours. Coagulation Profile: No results for input(s): "INR", "PROTIME" in the last 168 hours. Cardiac Enzymes: No results for input(s): "CKTOTAL", "CKMB", "CKMBINDEX", "TROPONINI" in the last 168 hours. BNP (last 3 results) Recent Labs    07/11/22 1451  PROBNP 7.0   HbA1C: No results for input(s): "HGBA1C" in the last 72 hours. CBG: Recent Labs  Lab 01/14/23 1634 01/14/23 2024 01/15/23 0109 01/15/23 0443 01/15/23 0813  GLUCAP 123* 113* 136* 132* 129*   Lipid Profile: Recent Labs    01/15/23 0520  TRIG 100   Thyroid Function Tests: No results for input(s): "TSH", "T4TOTAL", "FREET4", "T3FREE", "THYROIDAB" in the last 72 hours. Anemia Panel: No results for input(s): "VITAMINB12", "FOLATE", "FERRITIN", "TIBC", "IRON", "RETICCTPCT" in the last 72 hours. Sepsis Labs: Recent Labs  Lab  01/09/23 1527 01/09/23 1731  LATICACIDVEN 1.0 1.2    No results found for this or any previous visit (from the past 240 hour(s)).       Radiology Studies: No results found.      Scheduled Meds:  (feeding supplement) PROSource Plus  30 mL Oral BID BM   sodium chloride   Intravenous Once   amiodarone  200 mg Oral Daily   Chlorhexidine Gluconate Cloth  6 each Topical Q0600   feeding supplement  1 Container Oral TID BM   Gerhardt's butt cream   Topical Daily   pantoprazole (PROTONIX) IV  40 mg Intravenous Q12H   sodium chloride flush  3 mL Intravenous Q12H   Continuous Infusions:  albumin human Stopped (01/01/23 1020)   anticoagulant sodium citrate      ceFAZolin (ANCEF) IV     TPN ADULT (ION) 80 mL/hr at 01/15/23 0300     LOS: 26 days    Time spent: 45 minutes spent on chart review, discussion with nursing staff, consultants, updating family and interview/physical exam; more than 50% of that time was spent in counseling and/or coordination of care.    Joseph Art, DO [facility] Available via Epic secure chat 7am-7pm After these hours, please refer to coverage provider listed on [email or web address] 01/15/2023, 11:10 AM

## 2023-01-15 NOTE — Progress Notes (Signed)
5 Days Post-Op   Subjective/Chief Complaint: No complaints    Objective: Vital signs in last 24 hours: Temp:  [97.7 F (36.5 C)-98.5 F (36.9 C)] 97.9 F (36.6 C) (10/07 0816) Pulse Rate:  [77-84] 79 (10/07 0816) Resp:  [14-22] 14 (10/07 0816) BP: (150-171)/(65-82) 156/82 (10/07 0816) SpO2:  [95 %-100 %] 98 % (10/07 0816) Weight:  [100.5 kg] 100.5 kg (10/07 0440) Last BM Date : 01/13/23  Intake/Output from previous day: 10/06 0701 - 10/07 0700 In: 2362.9 [I.V.:2017.9; Blood:345] Out: 950 [Urine:950] Intake/Output this shift: Total I/O In: -  Out: 600 [Urine:600]   Gen: alert, NAD Pulm: respirations unlabored  Abd: soft, nondistended, nontender, open midline incision open clean    Lab Results:  Recent Labs    01/14/23 0403 01/14/23 1620 01/15/23 0520  WBC 6.3  --  5.9  HGB 6.9* 7.5* 8.1*  HCT 20.9* 22.7* 24.1*  PLT 168  --  172   BMET Recent Labs    01/14/23 0403 01/15/23 0520  NA 137 140  K 3.9 4.1  CL 100 99  CO2 25 28  GLUCOSE 127* 138*  BUN 110* 131*  CREATININE 3.17* 4.13*  CALCIUM 7.7* 8.1*   PT/INR No results for input(s): "LABPROT", "INR" in the last 72 hours. ABG No results for input(s): "PHART", "HCO3" in the last 72 hours.  Invalid input(s): "PCO2", "PO2"  Studies/Results: No results found.  Anti-infectives: Anti-infectives (From admission, onward)    Start     Dose/Rate Route Frequency Ordered Stop   01/15/23 1000  ceFAZolin (ANCEF) IVPB 2g/100 mL premix       Note to Pharmacy: Give in IR for surgical prophylaxis   2 g 200 mL/hr over 30 Minutes Intravenous  Once 01/14/23 1818     12/30/22 1800  vancomycin (VANCOCIN) IVPB 1000 mg/200 mL premix        1,000 mg 200 mL/hr over 60 Minutes Intravenous Every 24 hours 12/29/22 1844 01/03/23 1946   12/30/22 0600  vancomycin (VANCOCIN) IVPB 1000 mg/200 mL premix  Status:  Discontinued        1,000 mg 200 mL/hr over 60 Minutes Intravenous Every 12 hours 12/29/22 1842 12/29/22 1844    12/29/22 2200  meropenem (MERREM) 1 g in sodium chloride 0.9 % 100 mL IVPB  Status:  Discontinued        1 g 200 mL/hr over 30 Minutes Intravenous Every 12 hours 12/29/22 1705 12/29/22 1842   12/29/22 1930  meropenem (MERREM) 1 g in sodium chloride 0.9 % 100 mL IVPB        1 g 200 mL/hr over 30 Minutes Intravenous Every 8 hours 12/29/22 1842 01/04/23 0700   12/29/22 1800  vancomycin (VANCOREADY) IVPB 2000 mg/400 mL        2,000 mg 200 mL/hr over 120 Minutes Intravenous  Once 12/29/22 1705 12/29/22 1958   12/23/22 1230  piperacillin-tazobactam (ZOSYN) IVPB 2.25 g        2.25 g 100 mL/hr over 30 Minutes Intravenous Every 8 hours 12/23/22 1139 12/28/22 2109   12/22/22 0000  piperacillin-tazobactam (ZOSYN) IVPB 3.375 g  Status:  Discontinued        3.375 g 12.5 mL/hr over 240 Minutes Intravenous Every 8 hours 12/21/22 2331 12/23/22 1139       Assessment/Plan: 67 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 Karin Lieu, exploratory laparotomy and small bowel resection Dr. Janee Morn -S/p re-exploration, replace abthera FB 12/23/22   -S/p re-exploration,  small bowel resection with anastomosis x2, assessment of bowel perfusion with ICG, abdominal closure 9/16 Dr. Andrey Campanile     - BRBPR s/p egd/colonoscopy 10/2 that revealed severe ulceration in the rectum and recto- sigmoid colon, no active bleeding at the time. Biopsy showed ulceration with inflammatory changes consistent with ischemia and no viral cytopathic effect, stains pending - No further rectal bleeding.  Hemoglobin up to 8.1    No further bleeding at this point    Would hold off on anticoagulation for now given all of his bleeding issues. Given ongoing anemic issues, would hold off on anticoagulation indefinitely since the risk of bleeding outweigh the benefits in the circumstance. - daily wet to dry dressing changes to midline abdominal wound   FEN: reg, TPN VTE: ASA PR,  bivalirudin on hold currently  ID: No current abx   LOS: 26 days    Dortha Schwalbe MD  01/15/2023

## 2023-01-15 NOTE — Progress Notes (Signed)
Calorie Count Note  72 hour calorie count ordered. Results below:  Diet: regular Supplements: Ensure Enlive po TID (350 kcal, 20 grams of protein per bottle), Magic Cup (290 kcal, 9 grams of protein)  Pt reports he dislikes Ensure Enlive supplements and is not drinking them. He has only received one Magic Cup supplement on tray so far so has now been able to really try this supplement. Wants to try clear supplements instead of Ensure to see if he can tolerate better after discussing all options available.  Day 1: Lunch 10/4: 50% braised beef pot roast with gravy and mashed potatoes with gravy (169 kcal, 10.5 grams of protein) Dinner 10/4: 8 oz orange juice (60 kcal, 0 grams of protein) Breakfast 10/5: 100% plain coffee, 50% chocolate milk (75 kcal, 4 grams of protein) Supplements: N/A - pt not drinking supplements  Total intake Day 1: 304 kcal (13% of minimum estimated needs)  14.5 grams of protein (10% of minimum estimated needs)   Day 2: Lunch 10/5: 50% [place] and ham sandwich with lettuce, tomato, and mayo, 50% chips, 50% chocolate milk (328 kcal, 13 grams of protein) Dinner 10/5: N/A - no meal ticket available, wife thinks pt did not eat this meal Breakfast 10/6: 1/2 blueberry muffin, 100% chocolate milk (235 kcal, 9 grams of protein) Supplements: N/A - pt not drinking supplements  Total intake Day 2: 563 kcal (24% of minimum estimated needs)  22 grams of protein (16% of minimum estimated needs)   Day 3: Lunch 10/6: 50% [place] and ham sandwich with lettuce and tomato, 100% chips, 75% chocolate milk (355 kcal, 16 grams of protein) Dinner 10/6: N/A - pt reports he did not eat this meal Breakfast 10/7: N/A - pt NPO after midnight for procedure so missed this meal Supplements: N/A - pt not drinking supplements  Total intake Day 3: 355 kcal (15% of minimum estimated needs)  16 grams of protein (11% of minimum estimated needs)  Estimated Nutritional Needs:  Kcal:  2300-2500  kcals Protein:  140 -150 g Fluid:  >/= 2.3 L  Nutrition Dx: Severe Malnutrition related to acute illness as evidenced by energy intake < or equal to 50% for > or equal to 5 days, percent weight loss.  -Ongoing.  Goal: Patient will meet greater than or equal to 90% of their needs  -Met with TPN.  Intervention:  -Continue TPN at goal rate to meet 100% of estimated needs until patient is meeting at least 60% of his estimated nutrition needs PO. -Recommend continuing another 48 hours of calorie count to assist with weaning TPN. -Discontinue Ensure Enlive as pt reports he does not like these supplements. -Provide Boost Breeze po TID, each supplement provides 250 kcal and 9 grams of protein. -Provide PROSource Plus po BID, each supplement provides 100 kcal and 15 grams of protein. -Continue Magic cup TID with meals, each supplement provides 290 kcal and 9 grams of protein. Verified that these are ordered to come on each tray.  Letta Median, MS, RD, LDN, CNSC Pager number available on Amion

## 2023-01-15 NOTE — Progress Notes (Signed)
Physical Therapy Treatment Patient Details Name: [name] [name] MRN: [id number] DOB: [birth year] Today's Date: 01/15/2023   History of Present Illness Pt is a 67 y/o male admitted 9/11 with several weeks of abdominal pain along with a 30 pound weight loss. Work up included mesenteric ischemia and necrotic area of jejunum. S/p Exploratory laparotomy and small bowel resection 9/12, reopening of the laparotomy 9/14, second re-opening of laparotomy and more small bowel resection with anastomosis x2 9/16. 9/18 iHD, Extubated 9/22; 9/26 trans out of ICU; 9/29 rectal bleeding began 9/30-10/1 hypotensive and return to ICU with ongoing bleeding; 10/2 endoscopy and colonoscopy deep ulcerations encompassing 80% on the observed bowel with stigmata of recent bleeding. PMHx:testicular CA, HTN, ankle and patellar fx's, retinal detachment    PT Comments  Pt in bed upon arrival with wife present. Pt reported feeling fatigued upon arrival and was anxious about mobility. Pt required multiple rest breaks when attempting to move B LE towards EOB to sit upright. Pt declined further bed mobility and transfers stating his legs were too stiff. PT performed PROM and static stretches in supine to B LE per patient request. Acute PT to follow.    If plan is discharge home, recommend the following: A lot of help with walking and/or transfers;A lot of help with bathing/dressing/bathroom;Assistance with cooking/housework;Assist for transportation           Precautions / Restrictions Precautions Precautions: Fall Precaution Comments: anxious Restrictions Weight Bearing Restrictions: No     Mobility  Bed Mobility Overal bed mobility: Needs Assistance    General bed mobility comments: totalAx2 to scoot towards HOB in supine    Transfers  General transfer comment: pt declined transfer        Balance  Unable to assess       Cognition Arousal: Alert Behavior During Therapy: Flat affect Overall Cognitive Status:  Within Functional Limits for tasks assessed     Exercises General Exercises - Lower Extremity Ankle Circles/Pumps: AROM, Supine, Both, 10 reps Heel Slides: AAROM, Both, 5 reps Hip ABduction/ADduction: PROM, Supine, Both, 5 reps Other Exercises Other Exercises: supine hip flexion stretch, x45 sec, 2 reps B Other Exercises: supine hamstring stretch, x45 sec, 2 reps B        Pertinent Vitals/Pain Pain Assessment Pain Assessment: Faces Faces Pain Scale: Hurts little more Pain Location: B LE Pain Descriptors / Indicators: Grimacing, Sore Pain Intervention(s): Limited activity within patient's tolerance, Monitored during session, Repositioned     PT Goals (current goals can now be found in the care plan section) Progress towards PT goals: Progressing toward goals    Frequency    Min 1X/week       AM-PAC PT "6 Clicks" Mobility   Outcome Measure  Help needed turning from your back to your side while in a flat bed without using bedrails?: A Lot Help needed moving from lying on your back to sitting on the side of a flat bed without using bedrails?: A Lot Help needed moving to and from a bed to a chair (including a wheelchair)?: Total Help needed standing up from a chair using your arms (e.g., wheelchair or bedside chair)?: Total Help needed to walk in hospital room?: Total Help needed climbing 3-5 steps with a railing? : Total 6 Click Score: 8    End of Session   Activity Tolerance: Patient limited by fatigue Patient left: in bed;with call bell/phone within reach;with bed alarm set;with family/visitor present Nurse Communication: Mobility status PT Visit Diagnosis: Other abnormalities of gait  and mobility (R26.89);Muscle weakness (generalized) (M62.81)     Time: 1308-6578 PT Time Calculation (min) (ACUTE ONLY): 25 min  Charges:    $Therapeutic Exercise: 23-37 mins PT General Charges $$ ACUTE PT VISIT: 1 Visit                     Hilton Cork, PT, DPT Secure Chat  Preferred  Rehab Office [phone]   Arturo Morton Brion Aliment 01/15/2023, 3:30 PM

## 2023-01-15 NOTE — Consult Note (Signed)
WOC Nurse wound follow up Wound type: Stage 3 Pressure Injury Measurement: 3cm x 1cm x 0.1cm Wound bed: 100% yellow  Drainage (amount, consistency, odor) unable to assess due to barrier ointment in place  Periwound: intact  Dressing procedure/placement/frequency: DC barrier ointment to surrounding tissue Single layer of xeroform to the wound bed, top with foam Change daily  WOC Nurse team will follow with you and see patient within 10 days for wound assessments.  Please notify WOC nurses of any acute changes in the wounds or any new areas of concern Kallon Caylor [facility] MSN, RN,CWOCN, CNS, CWON-AP [phone]

## 2023-01-15 NOTE — Progress Notes (Signed)
[facility] Progress Note   Assessment/ Plan:   Acute kidney injury on CKD IIIb: Baseline creatinine level around 2.0.  Acute kidney injury likely ischemic ATN due to ex lap surgery/IV contrast/hypotension  Started on HD 9/18 due to inability to wean from vent and worsening renal function.  He was then switched to CRRT on 9/20-24. Course complicated by GI bleed.  Started on HD on 10/5 after nontunneled catheter placement with IR on 10/4 Reconsulted IR for a tunneled catheter- hopefully tomorrow Plan for HD again on Monday, 10/7 and anticipate again on Wednesday If he can tolerate sitting in a chair in his room he would like to try sitting in a chair for HD as early as this Wednesday, 10/9 Agree with not intervening on right RAS given that his right kidney is already much smaller than anticipated, discussed with VVS at that time. Likely not much function being contributed from right.   Strict ins and outs and daily weights Note that if contrast is clinically indicated urgently or emergently would then proceed with contrast  Acute mesenteric ischemia: Status post small bowel resection and superior mesenteric artery stent placement.  Followed by vascular surgery.  S/p OR again 9/16 for exlap and further small bowel resection, abd closed Metabolic acidosis - resolved Vent -dependent respiratory failure: Extubated and on room air and monitoring volume and resp status closely given large amount of inputs required.  Anemia due to acute blood loss: transfuse prn for hgb <7.  PRBC's per primary team. Multiple transfusions on 10/1.  S/p EGD and colonoscopy on 10/2. Getting PRBC's on 10/6.  Acute GI bleed - s/p PRBC's and GI is following.  S/p EGD and colonoscopy as above on 10/2 with severe mucosal ulceration in the rectum and rectosigmoid colon (felt ischemic or infectious per GI) Hypertension - shock resolved.  Avoid hypotension    Severe protein malnutrition - nutrition per primary  team Transaminitis - may have been setting of shock - improving   Subjective:    Seen in room.  Tunneled cath deferred to tomorrow.     Objective:   BP (!) 160/68 (BP Location: Left Arm)   Pulse 86   Temp 98.1 F (36.7 C) (Oral)   Resp 17   Ht 5\' 11"  (1.803 m)   Wt 100.5 kg   SpO2 96%   BMI 30.90 kg/m   Physical Exam: Gen:NAD, lying flat in bed CVS: RRR Resp: clear Abd: soft Ext: no LE edema ACCESS: nontunneled internal jugular cathter R side   Labs: BMET Recent Labs  Lab 01/09/23 0452 01/09/23 1132 01/10/23 0333 01/11/23 0401 01/12/23 0400 01/13/23 0400 01/14/23 0403 01/15/23 0520  NA 142 142 139 138 135 138 137 140  K 4.4 4.1 3.8 3.7 4.5 4.8 3.9 4.1  CL 117*  --  114* 111 105 104 100 99  CO2 12*  --  18* 18* 17* 18* 25 28  GLUCOSE 156*  --  148* 134* 125* 122* 127* 138*  BUN 119*  --  120* 130* 147* 160* 110* 131*  CREATININE 2.77*  --  2.73* 2.95* 3.51* 4.05* 3.17* 4.13*  CALCIUM 7.6*  --  7.3* 7.6* 8.0* 8.3* 7.7* 8.1*  PHOS 4.6  --  4.0 4.2  --   --  4.3 4.5   CBC Recent Labs  Lab 01/12/23 1755 01/13/23 0400 01/14/23 0403 01/14/23 1620 01/15/23 0520  WBC 8.5 7.9 6.3  --  5.9  HGB 7.3* 7.6* 6.9* 7.5* 8.1*  HCT 22.4*  22.7* 20.9* 22.7* 24.1*  MCV 92.2 92.3 88.2  --  90.9  PLT 190 183 168  --  172      Medications:     (feeding supplement) PROSource Plus  30 mL Oral BID BM   sodium chloride   Intravenous Once   amiodarone  200 mg Oral Daily   Chlorhexidine Gluconate Cloth  6 each Topical Q0600   feeding supplement  1 Container Oral TID BM   Gerhardt's butt cream   Topical Daily   pantoprazole (PROTONIX) IV  40 mg Intravenous Q12H   sodium chloride flush  3 mL Intravenous Q12H     Bufford Buttner, MD 01/15/2023, 2:07 PM

## 2023-01-15 NOTE — Plan of Care (Signed)

## 2023-01-15 NOTE — Consult Note (Signed)
COMPLICATIONS none IMPRESSION: 1. Technically successful right IJ Mahurkar catheter placement. Electronically Signed    By: Corlis Leak M.D.   On: 01/13/2023 09:29   DG Chest Port 1 View  Result Date: 01/09/2023 CLINICAL DATA:  Shortness of breath with abdominal pain for several days. History of hypertension. EXAM: PORTABLE CHEST 1 VIEW COMPARISON:  Radiographs 12/29/2022 and 12/23/2022. Chest CT 12/29/2022. FINDINGS: 1121 hours. Interval extubation and removal of the enteric tube. Right IJ central venous catheter has been removed. Left IJ central venous catheter appears unchanged, projecting to the mid SVC level. The heart size and mediastinal contours are stable. There are persistent low lung volumes. The lungs appear clear. No pleural effusion or pneumothorax. The bones appear unchanged, without acute findings. Telemetry leads overlie the chest. IMPRESSION: Interval extubation and removal of the enteric tube and right IJ central venous catheter. No acute cardiopulmonary process. Electronically Signed   By: Carey Bullocks M.D.   On: 01/09/2023 13:23   DG Swallowing Func-Speech Pathology  Result Date: 01/08/2023 Table formatting from the original result was not included. Modified Barium Swallow Study Patient Details Name: [name] [name] MRN: [id number] Date of Birth: 1956/02/23 Today's Date: 01/08/2023 HPI/PMH: HPI: Pt is a 67 yo male presenting to ED from OP vascular surgery office with known mesenteric artery stenosis s/p SMA angiogram. Admitted 9/11 with several week history of abdominal pain with 30 lb weight loss. W/u included mesenteric ischemia and necrotic area of jejunum s/p exploratory laparotomy and small bowel resection 9/12, reopening of laparotomy 9/14, second reopening and small bowel resection with anastomosis x2 9/26. Intubated 9/12-9/22. PMH includes testicular cancer, HTN, retinal detachment, GERD, HTN, HLD Clinical Impression: Clinical Impression: Pt presents with a suspected cognitively based oral dysphagia, although his swallow is overall functional. Suspect the taste of the barium contributed to oral  holding and gagging, although oral holding of purees was also noted at bedside. Only one instance of trace, transient penetration occurred as pt was attempting to swallow the pill with thin liquids and seemingly gagged. He exhibited great airway protection throughout this event and no further penetration/aspiration was observed throughout trials of nectar or honey thick liquids, purees, and solids. Pt required frequent breaks and had increased WOB at times. Given pt's fatigue and cognition, recommend initiating a diet of full liquids with good potential to upgrade as mentation improves. He will require total assistance for feeding. Question pt's ability to attend throughout the course of a meal and achieve adequate oral intake at this time, therefore, he may require continued TPN until adequate intake is achieved. Will continue to follow to target diet toleration and upgrade as able. Factors that may increase risk of adverse event in presence of aspiration Rubye Oaks & Clearance Coots 2021): Factors that may increase risk of adverse event in presence of aspiration Rubye Oaks & Clearance Coots 2021): Respiratory or GI disease; Reduced cognitive function; Frail or deconditioned; Dependence for feeding and/or oral hygiene Recommendations/Plan: Swallowing Evaluation Recommendations Swallowing Evaluation Recommendations Recommendations: PO diet PO Diet Recommendation: Full liquid diet Liquid Administration via: Spoon; Cup; Straw Medication Administration: Whole meds with puree Supervision: Full assist for feeding; Full supervision/cueing for swallowing strategies Swallowing strategies  : Minimize environmental distractions; Slow rate; Small bites/sips Postural changes: Position pt fully upright for meals Oral care recommendations: Oral care BID (2x/day); Staff/trained caregiver to provide oral care Treatment Plan Treatment Plan Treatment recommendations: Therapy as outlined in treatment plan below Follow-up recommendations: Acute inpatient  rehab (3 hours/day) Functional status assessment: Patient has had a recent  No midline shift, ventriculomegaly, mass effect, evidence of mass lesion, intracranial hemorrhage or evidence of cortically based acute infarction. Gray-white matter differentiation is within normal limits throughout the brain. Vascular: Calcified atherosclerosis at the skull base. No suspicious intracranial vascular hyperdensity. Skull: No acute osseous abnormality identified. Sinuses/Orbits: Right nasoenteric tube in place. Subtotal opacified right maxillary sinus with mild bubbly opacity. Bubbly opacity in the left sphenoid. Other paranasal sinuses are well aerated. Mild left mastoid effusion. Tympanic cavities remain clear. Other: No acute orbit or scalp soft tissue finding. IMPRESSION: 1. Normal for age noncontrast CT appearance of the brain. 2. Right nasoenteric tube in place. Mild paranasal sinus inflammation, left mastoid effusion. Electronically Signed   By: Odessa Fleming M.D.   On: 12/29/2022 09:48   DG CHEST PORT 1 VIEW  Result Date: 12/29/2022 CLINICAL DATA:  Endotracheally intubated. EXAM: PORTABLE CHEST 1 VIEW COMPARISON:  Radiograph  12/23/2022 FINDINGS: The endotracheal tube tip is at the level of the clavicular heads 6.7 cm from the carina. Tip of the enteric tube is below the diaphragm in the stomach. Unchanged positioning of bilateral central lines. Stable heart size and mediastinal contours. No pneumothorax. Improvement in bibasilar atelectasis without confluent airspace disease. No significant pleural effusion. IMPRESSION: 1. Stable support apparatus. 2. Improvement in bibasilar atelectasis. Electronically Signed   By: Narda Rutherford M.D.   On: 12/29/2022 09:46   DG Abd Portable 1V  Result Date: 12/28/2022 CLINICAL DATA:  NG Tube EXAM: PORTABLE ABDOMEN - 1 VIEW COMPARISON:  Abdominal radiograph 12/21/22 FINDINGS: Enteric tube courses below diaphragm with side hole and tip projecting over the expected location of the stomach. Paucity of bowel gas limits the ability to assess for obstruction, but within this limitation. No radio-opaque calculi or other significant radiographic abnormality are seen. Lung bases are clear IMPRESSION: Enteric tube courses below diaphragm with side hole and tip projecting over the expected location of the stomach. Electronically Signed   By: Lorenza Cambridge M.D.   On: 12/28/2022 12:24   VAS US RENAL ARTERY DUPLEX  Result Date: 12/26/2022 ABDOMINAL VISCERAL Patient Name:  [name] [name]  Date of Exam:   12/26/2022 Medical Rec #: [id number]      Accession #:    [id number] Date of Birth: 09/24/1955      Patient Gender: M Patient Age:   16 years Exam Location:  [facility] Procedure:      VAS US RENAL ARTERY DUPLEX Referring Phys: [id number] JOSHUA E ROBINS -------------------------------------------------------------------------------- Indications: Concerned for right renal artery occlusion. High Risk Factors: Hypertension, hyperlipidemia. Vascular Interventions: SMA stenting for acute mesenteric ischemia on 12/23/22. Limitations: Air/bowel gas, obesity and BANDAGES IN PLACE FROM SURGERY ON 12/25/22 -  RE-EXPLORATION LAPAROTOMY W/CLOSURE (Abdomen) SMALL BOWEL RESECTION W/ANASTOMOSIS. Comparison Study: Renal duplex on 12/05/22 - Patent renal arteries bilaterally                   with small right kidney. Performing Technologist: Erin Hearing  Examination Guidelines: A complete evaluation includes B-mode imaging, spectral Doppler, color Doppler, and power Doppler as needed of all accessible portions of each vessel. Bilateral testing is considered an integral part of a complete examination. Limited examinations for reoccurring indications may be performed as noted.  Duplex Findings:  Mesenteric Technologist observations: Aorta and mesenteric and celiac arteries not visualized due to s/p surgery.   +------------------+--------+--------+--------------+ Right Renal ArteryPSV cm/sEDV cm/s   Comment     +------------------+--------+--------+--------------+ Origin  COMPLICATIONS none IMPRESSION: 1. Technically successful right IJ Mahurkar catheter placement. Electronically Signed    By: Corlis Leak M.D.   On: 01/13/2023 09:29   DG Chest Port 1 View  Result Date: 01/09/2023 CLINICAL DATA:  Shortness of breath with abdominal pain for several days. History of hypertension. EXAM: PORTABLE CHEST 1 VIEW COMPARISON:  Radiographs 12/29/2022 and 12/23/2022. Chest CT 12/29/2022. FINDINGS: 1121 hours. Interval extubation and removal of the enteric tube. Right IJ central venous catheter has been removed. Left IJ central venous catheter appears unchanged, projecting to the mid SVC level. The heart size and mediastinal contours are stable. There are persistent low lung volumes. The lungs appear clear. No pleural effusion or pneumothorax. The bones appear unchanged, without acute findings. Telemetry leads overlie the chest. IMPRESSION: Interval extubation and removal of the enteric tube and right IJ central venous catheter. No acute cardiopulmonary process. Electronically Signed   By: Carey Bullocks M.D.   On: 01/09/2023 13:23   DG Swallowing Func-Speech Pathology  Result Date: 01/08/2023 Table formatting from the original result was not included. Modified Barium Swallow Study Patient Details Name: [name] [name] MRN: [id number] Date of Birth: 1956/02/23 Today's Date: 01/08/2023 HPI/PMH: HPI: Pt is a 67 yo male presenting to ED from OP vascular surgery office with known mesenteric artery stenosis s/p SMA angiogram. Admitted 9/11 with several week history of abdominal pain with 30 lb weight loss. W/u included mesenteric ischemia and necrotic area of jejunum s/p exploratory laparotomy and small bowel resection 9/12, reopening of laparotomy 9/14, second reopening and small bowel resection with anastomosis x2 9/26. Intubated 9/12-9/22. PMH includes testicular cancer, HTN, retinal detachment, GERD, HTN, HLD Clinical Impression: Clinical Impression: Pt presents with a suspected cognitively based oral dysphagia, although his swallow is overall functional. Suspect the taste of the barium contributed to oral  holding and gagging, although oral holding of purees was also noted at bedside. Only one instance of trace, transient penetration occurred as pt was attempting to swallow the pill with thin liquids and seemingly gagged. He exhibited great airway protection throughout this event and no further penetration/aspiration was observed throughout trials of nectar or honey thick liquids, purees, and solids. Pt required frequent breaks and had increased WOB at times. Given pt's fatigue and cognition, recommend initiating a diet of full liquids with good potential to upgrade as mentation improves. He will require total assistance for feeding. Question pt's ability to attend throughout the course of a meal and achieve adequate oral intake at this time, therefore, he may require continued TPN until adequate intake is achieved. Will continue to follow to target diet toleration and upgrade as able. Factors that may increase risk of adverse event in presence of aspiration Rubye Oaks & Clearance Coots 2021): Factors that may increase risk of adverse event in presence of aspiration Rubye Oaks & Clearance Coots 2021): Respiratory or GI disease; Reduced cognitive function; Frail or deconditioned; Dependence for feeding and/or oral hygiene Recommendations/Plan: Swallowing Evaluation Recommendations Swallowing Evaluation Recommendations Recommendations: PO diet PO Diet Recommendation: Full liquid diet Liquid Administration via: Spoon; Cup; Straw Medication Administration: Whole meds with puree Supervision: Full assist for feeding; Full supervision/cueing for swallowing strategies Swallowing strategies  : Minimize environmental distractions; Slow rate; Small bites/sips Postural changes: Position pt fully upright for meals Oral care recommendations: Oral care BID (2x/day); Staff/trained caregiver to provide oral care Treatment Plan Treatment Plan Treatment recommendations: Therapy as outlined in treatment plan below Follow-up recommendations: Acute inpatient  rehab (3 hours/day) Functional status assessment: Patient has had a recent  No midline shift, ventriculomegaly, mass effect, evidence of mass lesion, intracranial hemorrhage or evidence of cortically based acute infarction. Gray-white matter differentiation is within normal limits throughout the brain. Vascular: Calcified atherosclerosis at the skull base. No suspicious intracranial vascular hyperdensity. Skull: No acute osseous abnormality identified. Sinuses/Orbits: Right nasoenteric tube in place. Subtotal opacified right maxillary sinus with mild bubbly opacity. Bubbly opacity in the left sphenoid. Other paranasal sinuses are well aerated. Mild left mastoid effusion. Tympanic cavities remain clear. Other: No acute orbit or scalp soft tissue finding. IMPRESSION: 1. Normal for age noncontrast CT appearance of the brain. 2. Right nasoenteric tube in place. Mild paranasal sinus inflammation, left mastoid effusion. Electronically Signed   By: Odessa Fleming M.D.   On: 12/29/2022 09:48   DG CHEST PORT 1 VIEW  Result Date: 12/29/2022 CLINICAL DATA:  Endotracheally intubated. EXAM: PORTABLE CHEST 1 VIEW COMPARISON:  Radiograph  12/23/2022 FINDINGS: The endotracheal tube tip is at the level of the clavicular heads 6.7 cm from the carina. Tip of the enteric tube is below the diaphragm in the stomach. Unchanged positioning of bilateral central lines. Stable heart size and mediastinal contours. No pneumothorax. Improvement in bibasilar atelectasis without confluent airspace disease. No significant pleural effusion. IMPRESSION: 1. Stable support apparatus. 2. Improvement in bibasilar atelectasis. Electronically Signed   By: Narda Rutherford M.D.   On: 12/29/2022 09:46   DG Abd Portable 1V  Result Date: 12/28/2022 CLINICAL DATA:  NG Tube EXAM: PORTABLE ABDOMEN - 1 VIEW COMPARISON:  Abdominal radiograph 12/21/22 FINDINGS: Enteric tube courses below diaphragm with side hole and tip projecting over the expected location of the stomach. Paucity of bowel gas limits the ability to assess for obstruction, but within this limitation. No radio-opaque calculi or other significant radiographic abnormality are seen. Lung bases are clear IMPRESSION: Enteric tube courses below diaphragm with side hole and tip projecting over the expected location of the stomach. Electronically Signed   By: Lorenza Cambridge M.D.   On: 12/28/2022 12:24   VAS US RENAL ARTERY DUPLEX  Result Date: 12/26/2022 ABDOMINAL VISCERAL Patient Name:  [name] [name]  Date of Exam:   12/26/2022 Medical Rec #: [id number]      Accession #:    [id number] Date of Birth: 09/24/1955      Patient Gender: M Patient Age:   16 years Exam Location:  [facility] Procedure:      VAS US RENAL ARTERY DUPLEX Referring Phys: [id number] JOSHUA E ROBINS -------------------------------------------------------------------------------- Indications: Concerned for right renal artery occlusion. High Risk Factors: Hypertension, hyperlipidemia. Vascular Interventions: SMA stenting for acute mesenteric ischemia on 12/23/22. Limitations: Air/bowel gas, obesity and BANDAGES IN PLACE FROM SURGERY ON 12/25/22 -  RE-EXPLORATION LAPAROTOMY W/CLOSURE (Abdomen) SMALL BOWEL RESECTION W/ANASTOMOSIS. Comparison Study: Renal duplex on 12/05/22 - Patent renal arteries bilaterally                   with small right kidney. Performing Technologist: Erin Hearing  Examination Guidelines: A complete evaluation includes B-mode imaging, spectral Doppler, color Doppler, and power Doppler as needed of all accessible portions of each vessel. Bilateral testing is considered an integral part of a complete examination. Limited examinations for reoccurring indications may be performed as noted.  Duplex Findings:  Mesenteric Technologist observations: Aorta and mesenteric and celiac arteries not visualized due to s/p surgery.   +------------------+--------+--------+--------------+ Right Renal ArteryPSV cm/sEDV cm/s   Comment     +------------------+--------+--------+--------------+ Origin  COMPLICATIONS none IMPRESSION: 1. Technically successful right IJ Mahurkar catheter placement. Electronically Signed    By: Corlis Leak M.D.   On: 01/13/2023 09:29   DG Chest Port 1 View  Result Date: 01/09/2023 CLINICAL DATA:  Shortness of breath with abdominal pain for several days. History of hypertension. EXAM: PORTABLE CHEST 1 VIEW COMPARISON:  Radiographs 12/29/2022 and 12/23/2022. Chest CT 12/29/2022. FINDINGS: 1121 hours. Interval extubation and removal of the enteric tube. Right IJ central venous catheter has been removed. Left IJ central venous catheter appears unchanged, projecting to the mid SVC level. The heart size and mediastinal contours are stable. There are persistent low lung volumes. The lungs appear clear. No pleural effusion or pneumothorax. The bones appear unchanged, without acute findings. Telemetry leads overlie the chest. IMPRESSION: Interval extubation and removal of the enteric tube and right IJ central venous catheter. No acute cardiopulmonary process. Electronically Signed   By: Carey Bullocks M.D.   On: 01/09/2023 13:23   DG Swallowing Func-Speech Pathology  Result Date: 01/08/2023 Table formatting from the original result was not included. Modified Barium Swallow Study Patient Details Name: [name] [name] MRN: [id number] Date of Birth: 1956/02/23 Today's Date: 01/08/2023 HPI/PMH: HPI: Pt is a 67 yo male presenting to ED from OP vascular surgery office with known mesenteric artery stenosis s/p SMA angiogram. Admitted 9/11 with several week history of abdominal pain with 30 lb weight loss. W/u included mesenteric ischemia and necrotic area of jejunum s/p exploratory laparotomy and small bowel resection 9/12, reopening of laparotomy 9/14, second reopening and small bowel resection with anastomosis x2 9/26. Intubated 9/12-9/22. PMH includes testicular cancer, HTN, retinal detachment, GERD, HTN, HLD Clinical Impression: Clinical Impression: Pt presents with a suspected cognitively based oral dysphagia, although his swallow is overall functional. Suspect the taste of the barium contributed to oral  holding and gagging, although oral holding of purees was also noted at bedside. Only one instance of trace, transient penetration occurred as pt was attempting to swallow the pill with thin liquids and seemingly gagged. He exhibited great airway protection throughout this event and no further penetration/aspiration was observed throughout trials of nectar or honey thick liquids, purees, and solids. Pt required frequent breaks and had increased WOB at times. Given pt's fatigue and cognition, recommend initiating a diet of full liquids with good potential to upgrade as mentation improves. He will require total assistance for feeding. Question pt's ability to attend throughout the course of a meal and achieve adequate oral intake at this time, therefore, he may require continued TPN until adequate intake is achieved. Will continue to follow to target diet toleration and upgrade as able. Factors that may increase risk of adverse event in presence of aspiration Rubye Oaks & Clearance Coots 2021): Factors that may increase risk of adverse event in presence of aspiration Rubye Oaks & Clearance Coots 2021): Respiratory or GI disease; Reduced cognitive function; Frail or deconditioned; Dependence for feeding and/or oral hygiene Recommendations/Plan: Swallowing Evaluation Recommendations Swallowing Evaluation Recommendations Recommendations: PO diet PO Diet Recommendation: Full liquid diet Liquid Administration via: Spoon; Cup; Straw Medication Administration: Whole meds with puree Supervision: Full assist for feeding; Full supervision/cueing for swallowing strategies Swallowing strategies  : Minimize environmental distractions; Slow rate; Small bites/sips Postural changes: Position pt fully upright for meals Oral care recommendations: Oral care BID (2x/day); Staff/trained caregiver to provide oral care Treatment Plan Treatment Plan Treatment recommendations: Therapy as outlined in treatment plan below Follow-up recommendations: Acute inpatient  rehab (3 hours/day) Functional status assessment: Patient has had a recent  No midline shift, ventriculomegaly, mass effect, evidence of mass lesion, intracranial hemorrhage or evidence of cortically based acute infarction. Gray-white matter differentiation is within normal limits throughout the brain. Vascular: Calcified atherosclerosis at the skull base. No suspicious intracranial vascular hyperdensity. Skull: No acute osseous abnormality identified. Sinuses/Orbits: Right nasoenteric tube in place. Subtotal opacified right maxillary sinus with mild bubbly opacity. Bubbly opacity in the left sphenoid. Other paranasal sinuses are well aerated. Mild left mastoid effusion. Tympanic cavities remain clear. Other: No acute orbit or scalp soft tissue finding. IMPRESSION: 1. Normal for age noncontrast CT appearance of the brain. 2. Right nasoenteric tube in place. Mild paranasal sinus inflammation, left mastoid effusion. Electronically Signed   By: Odessa Fleming M.D.   On: 12/29/2022 09:48   DG CHEST PORT 1 VIEW  Result Date: 12/29/2022 CLINICAL DATA:  Endotracheally intubated. EXAM: PORTABLE CHEST 1 VIEW COMPARISON:  Radiograph  12/23/2022 FINDINGS: The endotracheal tube tip is at the level of the clavicular heads 6.7 cm from the carina. Tip of the enteric tube is below the diaphragm in the stomach. Unchanged positioning of bilateral central lines. Stable heart size and mediastinal contours. No pneumothorax. Improvement in bibasilar atelectasis without confluent airspace disease. No significant pleural effusion. IMPRESSION: 1. Stable support apparatus. 2. Improvement in bibasilar atelectasis. Electronically Signed   By: Narda Rutherford M.D.   On: 12/29/2022 09:46   DG Abd Portable 1V  Result Date: 12/28/2022 CLINICAL DATA:  NG Tube EXAM: PORTABLE ABDOMEN - 1 VIEW COMPARISON:  Abdominal radiograph 12/21/22 FINDINGS: Enteric tube courses below diaphragm with side hole and tip projecting over the expected location of the stomach. Paucity of bowel gas limits the ability to assess for obstruction, but within this limitation. No radio-opaque calculi or other significant radiographic abnormality are seen. Lung bases are clear IMPRESSION: Enteric tube courses below diaphragm with side hole and tip projecting over the expected location of the stomach. Electronically Signed   By: Lorenza Cambridge M.D.   On: 12/28/2022 12:24   VAS US RENAL ARTERY DUPLEX  Result Date: 12/26/2022 ABDOMINAL VISCERAL Patient Name:  [name] [name]  Date of Exam:   12/26/2022 Medical Rec #: [id number]      Accession #:    [id number] Date of Birth: 09/24/1955      Patient Gender: M Patient Age:   16 years Exam Location:  [facility] Procedure:      VAS US RENAL ARTERY DUPLEX Referring Phys: [id number] JOSHUA E ROBINS -------------------------------------------------------------------------------- Indications: Concerned for right renal artery occlusion. High Risk Factors: Hypertension, hyperlipidemia. Vascular Interventions: SMA stenting for acute mesenteric ischemia on 12/23/22. Limitations: Air/bowel gas, obesity and BANDAGES IN PLACE FROM SURGERY ON 12/25/22 -  RE-EXPLORATION LAPAROTOMY W/CLOSURE (Abdomen) SMALL BOWEL RESECTION W/ANASTOMOSIS. Comparison Study: Renal duplex on 12/05/22 - Patent renal arteries bilaterally                   with small right kidney. Performing Technologist: Erin Hearing  Examination Guidelines: A complete evaluation includes B-mode imaging, spectral Doppler, color Doppler, and power Doppler as needed of all accessible portions of each vessel. Bilateral testing is considered an integral part of a complete examination. Limited examinations for reoccurring indications may be performed as noted.  Duplex Findings:  Mesenteric Technologist observations: Aorta and mesenteric and celiac arteries not visualized due to s/p surgery.   +------------------+--------+--------+--------------+ Right Renal ArteryPSV cm/sEDV cm/s   Comment     +------------------+--------+--------+--------------+ Origin  COMPLICATIONS none IMPRESSION: 1. Technically successful right IJ Mahurkar catheter placement. Electronically Signed    By: Corlis Leak M.D.   On: 01/13/2023 09:29   DG Chest Port 1 View  Result Date: 01/09/2023 CLINICAL DATA:  Shortness of breath with abdominal pain for several days. History of hypertension. EXAM: PORTABLE CHEST 1 VIEW COMPARISON:  Radiographs 12/29/2022 and 12/23/2022. Chest CT 12/29/2022. FINDINGS: 1121 hours. Interval extubation and removal of the enteric tube. Right IJ central venous catheter has been removed. Left IJ central venous catheter appears unchanged, projecting to the mid SVC level. The heart size and mediastinal contours are stable. There are persistent low lung volumes. The lungs appear clear. No pleural effusion or pneumothorax. The bones appear unchanged, without acute findings. Telemetry leads overlie the chest. IMPRESSION: Interval extubation and removal of the enteric tube and right IJ central venous catheter. No acute cardiopulmonary process. Electronically Signed   By: Carey Bullocks M.D.   On: 01/09/2023 13:23   DG Swallowing Func-Speech Pathology  Result Date: 01/08/2023 Table formatting from the original result was not included. Modified Barium Swallow Study Patient Details Name: [name] [name] MRN: [id number] Date of Birth: 1956/02/23 Today's Date: 01/08/2023 HPI/PMH: HPI: Pt is a 67 yo male presenting to ED from OP vascular surgery office with known mesenteric artery stenosis s/p SMA angiogram. Admitted 9/11 with several week history of abdominal pain with 30 lb weight loss. W/u included mesenteric ischemia and necrotic area of jejunum s/p exploratory laparotomy and small bowel resection 9/12, reopening of laparotomy 9/14, second reopening and small bowel resection with anastomosis x2 9/26. Intubated 9/12-9/22. PMH includes testicular cancer, HTN, retinal detachment, GERD, HTN, HLD Clinical Impression: Clinical Impression: Pt presents with a suspected cognitively based oral dysphagia, although his swallow is overall functional. Suspect the taste of the barium contributed to oral  holding and gagging, although oral holding of purees was also noted at bedside. Only one instance of trace, transient penetration occurred as pt was attempting to swallow the pill with thin liquids and seemingly gagged. He exhibited great airway protection throughout this event and no further penetration/aspiration was observed throughout trials of nectar or honey thick liquids, purees, and solids. Pt required frequent breaks and had increased WOB at times. Given pt's fatigue and cognition, recommend initiating a diet of full liquids with good potential to upgrade as mentation improves. He will require total assistance for feeding. Question pt's ability to attend throughout the course of a meal and achieve adequate oral intake at this time, therefore, he may require continued TPN until adequate intake is achieved. Will continue to follow to target diet toleration and upgrade as able. Factors that may increase risk of adverse event in presence of aspiration Rubye Oaks & Clearance Coots 2021): Factors that may increase risk of adverse event in presence of aspiration Rubye Oaks & Clearance Coots 2021): Respiratory or GI disease; Reduced cognitive function; Frail or deconditioned; Dependence for feeding and/or oral hygiene Recommendations/Plan: Swallowing Evaluation Recommendations Swallowing Evaluation Recommendations Recommendations: PO diet PO Diet Recommendation: Full liquid diet Liquid Administration via: Spoon; Cup; Straw Medication Administration: Whole meds with puree Supervision: Full assist for feeding; Full supervision/cueing for swallowing strategies Swallowing strategies  : Minimize environmental distractions; Slow rate; Small bites/sips Postural changes: Position pt fully upright for meals Oral care recommendations: Oral care BID (2x/day); Staff/trained caregiver to provide oral care Treatment Plan Treatment Plan Treatment recommendations: Therapy as outlined in treatment plan below Follow-up recommendations: Acute inpatient  rehab (3 hours/day) Functional status assessment: Patient has had a recent  No midline shift, ventriculomegaly, mass effect, evidence of mass lesion, intracranial hemorrhage or evidence of cortically based acute infarction. Gray-white matter differentiation is within normal limits throughout the brain. Vascular: Calcified atherosclerosis at the skull base. No suspicious intracranial vascular hyperdensity. Skull: No acute osseous abnormality identified. Sinuses/Orbits: Right nasoenteric tube in place. Subtotal opacified right maxillary sinus with mild bubbly opacity. Bubbly opacity in the left sphenoid. Other paranasal sinuses are well aerated. Mild left mastoid effusion. Tympanic cavities remain clear. Other: No acute orbit or scalp soft tissue finding. IMPRESSION: 1. Normal for age noncontrast CT appearance of the brain. 2. Right nasoenteric tube in place. Mild paranasal sinus inflammation, left mastoid effusion. Electronically Signed   By: Odessa Fleming M.D.   On: 12/29/2022 09:48   DG CHEST PORT 1 VIEW  Result Date: 12/29/2022 CLINICAL DATA:  Endotracheally intubated. EXAM: PORTABLE CHEST 1 VIEW COMPARISON:  Radiograph  12/23/2022 FINDINGS: The endotracheal tube tip is at the level of the clavicular heads 6.7 cm from the carina. Tip of the enteric tube is below the diaphragm in the stomach. Unchanged positioning of bilateral central lines. Stable heart size and mediastinal contours. No pneumothorax. Improvement in bibasilar atelectasis without confluent airspace disease. No significant pleural effusion. IMPRESSION: 1. Stable support apparatus. 2. Improvement in bibasilar atelectasis. Electronically Signed   By: Narda Rutherford M.D.   On: 12/29/2022 09:46   DG Abd Portable 1V  Result Date: 12/28/2022 CLINICAL DATA:  NG Tube EXAM: PORTABLE ABDOMEN - 1 VIEW COMPARISON:  Abdominal radiograph 12/21/22 FINDINGS: Enteric tube courses below diaphragm with side hole and tip projecting over the expected location of the stomach. Paucity of bowel gas limits the ability to assess for obstruction, but within this limitation. No radio-opaque calculi or other significant radiographic abnormality are seen. Lung bases are clear IMPRESSION: Enteric tube courses below diaphragm with side hole and tip projecting over the expected location of the stomach. Electronically Signed   By: Lorenza Cambridge M.D.   On: 12/28/2022 12:24   VAS US RENAL ARTERY DUPLEX  Result Date: 12/26/2022 ABDOMINAL VISCERAL Patient Name:  [name] [name]  Date of Exam:   12/26/2022 Medical Rec #: [id number]      Accession #:    [id number] Date of Birth: 09/24/1955      Patient Gender: M Patient Age:   16 years Exam Location:  [facility] Procedure:      VAS US RENAL ARTERY DUPLEX Referring Phys: [id number] JOSHUA E ROBINS -------------------------------------------------------------------------------- Indications: Concerned for right renal artery occlusion. High Risk Factors: Hypertension, hyperlipidemia. Vascular Interventions: SMA stenting for acute mesenteric ischemia on 12/23/22. Limitations: Air/bowel gas, obesity and BANDAGES IN PLACE FROM SURGERY ON 12/25/22 -  RE-EXPLORATION LAPAROTOMY W/CLOSURE (Abdomen) SMALL BOWEL RESECTION W/ANASTOMOSIS. Comparison Study: Renal duplex on 12/05/22 - Patent renal arteries bilaterally                   with small right kidney. Performing Technologist: Erin Hearing  Examination Guidelines: A complete evaluation includes B-mode imaging, spectral Doppler, color Doppler, and power Doppler as needed of all accessible portions of each vessel. Bilateral testing is considered an integral part of a complete examination. Limited examinations for reoccurring indications may be performed as noted.  Duplex Findings:  Mesenteric Technologist observations: Aorta and mesenteric and celiac arteries not visualized due to s/p surgery.   +------------------+--------+--------+--------------+ Right Renal ArteryPSV cm/sEDV cm/s   Comment     +------------------+--------+--------+--------------+ Origin  No midline shift, ventriculomegaly, mass effect, evidence of mass lesion, intracranial hemorrhage or evidence of cortically based acute infarction. Gray-white matter differentiation is within normal limits throughout the brain. Vascular: Calcified atherosclerosis at the skull base. No suspicious intracranial vascular hyperdensity. Skull: No acute osseous abnormality identified. Sinuses/Orbits: Right nasoenteric tube in place. Subtotal opacified right maxillary sinus with mild bubbly opacity. Bubbly opacity in the left sphenoid. Other paranasal sinuses are well aerated. Mild left mastoid effusion. Tympanic cavities remain clear. Other: No acute orbit or scalp soft tissue finding. IMPRESSION: 1. Normal for age noncontrast CT appearance of the brain. 2. Right nasoenteric tube in place. Mild paranasal sinus inflammation, left mastoid effusion. Electronically Signed   By: Odessa Fleming M.D.   On: 12/29/2022 09:48   DG CHEST PORT 1 VIEW  Result Date: 12/29/2022 CLINICAL DATA:  Endotracheally intubated. EXAM: PORTABLE CHEST 1 VIEW COMPARISON:  Radiograph  12/23/2022 FINDINGS: The endotracheal tube tip is at the level of the clavicular heads 6.7 cm from the carina. Tip of the enteric tube is below the diaphragm in the stomach. Unchanged positioning of bilateral central lines. Stable heart size and mediastinal contours. No pneumothorax. Improvement in bibasilar atelectasis without confluent airspace disease. No significant pleural effusion. IMPRESSION: 1. Stable support apparatus. 2. Improvement in bibasilar atelectasis. Electronically Signed   By: Narda Rutherford M.D.   On: 12/29/2022 09:46   DG Abd Portable 1V  Result Date: 12/28/2022 CLINICAL DATA:  NG Tube EXAM: PORTABLE ABDOMEN - 1 VIEW COMPARISON:  Abdominal radiograph 12/21/22 FINDINGS: Enteric tube courses below diaphragm with side hole and tip projecting over the expected location of the stomach. Paucity of bowel gas limits the ability to assess for obstruction, but within this limitation. No radio-opaque calculi or other significant radiographic abnormality are seen. Lung bases are clear IMPRESSION: Enteric tube courses below diaphragm with side hole and tip projecting over the expected location of the stomach. Electronically Signed   By: Lorenza Cambridge M.D.   On: 12/28/2022 12:24   VAS US RENAL ARTERY DUPLEX  Result Date: 12/26/2022 ABDOMINAL VISCERAL Patient Name:  [name] [name]  Date of Exam:   12/26/2022 Medical Rec #: [id number]      Accession #:    [id number] Date of Birth: 09/24/1955      Patient Gender: M Patient Age:   16 years Exam Location:  [facility] Procedure:      VAS US RENAL ARTERY DUPLEX Referring Phys: [id number] JOSHUA E ROBINS -------------------------------------------------------------------------------- Indications: Concerned for right renal artery occlusion. High Risk Factors: Hypertension, hyperlipidemia. Vascular Interventions: SMA stenting for acute mesenteric ischemia on 12/23/22. Limitations: Air/bowel gas, obesity and BANDAGES IN PLACE FROM SURGERY ON 12/25/22 -  RE-EXPLORATION LAPAROTOMY W/CLOSURE (Abdomen) SMALL BOWEL RESECTION W/ANASTOMOSIS. Comparison Study: Renal duplex on 12/05/22 - Patent renal arteries bilaterally                   with small right kidney. Performing Technologist: Erin Hearing  Examination Guidelines: A complete evaluation includes B-mode imaging, spectral Doppler, color Doppler, and power Doppler as needed of all accessible portions of each vessel. Bilateral testing is considered an integral part of a complete examination. Limited examinations for reoccurring indications may be performed as noted.  Duplex Findings:  Mesenteric Technologist observations: Aorta and mesenteric and celiac arteries not visualized due to s/p surgery.   +------------------+--------+--------+--------------+ Right Renal ArteryPSV cm/sEDV cm/s   Comment     +------------------+--------+--------+--------------+ Origin  COMPLICATIONS none IMPRESSION: 1. Technically successful right IJ Mahurkar catheter placement. Electronically Signed    By: Corlis Leak M.D.   On: 01/13/2023 09:29   DG Chest Port 1 View  Result Date: 01/09/2023 CLINICAL DATA:  Shortness of breath with abdominal pain for several days. History of hypertension. EXAM: PORTABLE CHEST 1 VIEW COMPARISON:  Radiographs 12/29/2022 and 12/23/2022. Chest CT 12/29/2022. FINDINGS: 1121 hours. Interval extubation and removal of the enteric tube. Right IJ central venous catheter has been removed. Left IJ central venous catheter appears unchanged, projecting to the mid SVC level. The heart size and mediastinal contours are stable. There are persistent low lung volumes. The lungs appear clear. No pleural effusion or pneumothorax. The bones appear unchanged, without acute findings. Telemetry leads overlie the chest. IMPRESSION: Interval extubation and removal of the enteric tube and right IJ central venous catheter. No acute cardiopulmonary process. Electronically Signed   By: Carey Bullocks M.D.   On: 01/09/2023 13:23   DG Swallowing Func-Speech Pathology  Result Date: 01/08/2023 Table formatting from the original result was not included. Modified Barium Swallow Study Patient Details Name: [name] [name] MRN: [id number] Date of Birth: 1956/02/23 Today's Date: 01/08/2023 HPI/PMH: HPI: Pt is a 67 yo male presenting to ED from OP vascular surgery office with known mesenteric artery stenosis s/p SMA angiogram. Admitted 9/11 with several week history of abdominal pain with 30 lb weight loss. W/u included mesenteric ischemia and necrotic area of jejunum s/p exploratory laparotomy and small bowel resection 9/12, reopening of laparotomy 9/14, second reopening and small bowel resection with anastomosis x2 9/26. Intubated 9/12-9/22. PMH includes testicular cancer, HTN, retinal detachment, GERD, HTN, HLD Clinical Impression: Clinical Impression: Pt presents with a suspected cognitively based oral dysphagia, although his swallow is overall functional. Suspect the taste of the barium contributed to oral  holding and gagging, although oral holding of purees was also noted at bedside. Only one instance of trace, transient penetration occurred as pt was attempting to swallow the pill with thin liquids and seemingly gagged. He exhibited great airway protection throughout this event and no further penetration/aspiration was observed throughout trials of nectar or honey thick liquids, purees, and solids. Pt required frequent breaks and had increased WOB at times. Given pt's fatigue and cognition, recommend initiating a diet of full liquids with good potential to upgrade as mentation improves. He will require total assistance for feeding. Question pt's ability to attend throughout the course of a meal and achieve adequate oral intake at this time, therefore, he may require continued TPN until adequate intake is achieved. Will continue to follow to target diet toleration and upgrade as able. Factors that may increase risk of adverse event in presence of aspiration Rubye Oaks & Clearance Coots 2021): Factors that may increase risk of adverse event in presence of aspiration Rubye Oaks & Clearance Coots 2021): Respiratory or GI disease; Reduced cognitive function; Frail or deconditioned; Dependence for feeding and/or oral hygiene Recommendations/Plan: Swallowing Evaluation Recommendations Swallowing Evaluation Recommendations Recommendations: PO diet PO Diet Recommendation: Full liquid diet Liquid Administration via: Spoon; Cup; Straw Medication Administration: Whole meds with puree Supervision: Full assist for feeding; Full supervision/cueing for swallowing strategies Swallowing strategies  : Minimize environmental distractions; Slow rate; Small bites/sips Postural changes: Position pt fully upright for meals Oral care recommendations: Oral care BID (2x/day); Staff/trained caregiver to provide oral care Treatment Plan Treatment Plan Treatment recommendations: Therapy as outlined in treatment plan below Follow-up recommendations: Acute inpatient  rehab (3 hours/day) Functional status assessment: Patient has had a recent  No midline shift, ventriculomegaly, mass effect, evidence of mass lesion, intracranial hemorrhage or evidence of cortically based acute infarction. Gray-white matter differentiation is within normal limits throughout the brain. Vascular: Calcified atherosclerosis at the skull base. No suspicious intracranial vascular hyperdensity. Skull: No acute osseous abnormality identified. Sinuses/Orbits: Right nasoenteric tube in place. Subtotal opacified right maxillary sinus with mild bubbly opacity. Bubbly opacity in the left sphenoid. Other paranasal sinuses are well aerated. Mild left mastoid effusion. Tympanic cavities remain clear. Other: No acute orbit or scalp soft tissue finding. IMPRESSION: 1. Normal for age noncontrast CT appearance of the brain. 2. Right nasoenteric tube in place. Mild paranasal sinus inflammation, left mastoid effusion. Electronically Signed   By: Odessa Fleming M.D.   On: 12/29/2022 09:48   DG CHEST PORT 1 VIEW  Result Date: 12/29/2022 CLINICAL DATA:  Endotracheally intubated. EXAM: PORTABLE CHEST 1 VIEW COMPARISON:  Radiograph  12/23/2022 FINDINGS: The endotracheal tube tip is at the level of the clavicular heads 6.7 cm from the carina. Tip of the enteric tube is below the diaphragm in the stomach. Unchanged positioning of bilateral central lines. Stable heart size and mediastinal contours. No pneumothorax. Improvement in bibasilar atelectasis without confluent airspace disease. No significant pleural effusion. IMPRESSION: 1. Stable support apparatus. 2. Improvement in bibasilar atelectasis. Electronically Signed   By: Narda Rutherford M.D.   On: 12/29/2022 09:46   DG Abd Portable 1V  Result Date: 12/28/2022 CLINICAL DATA:  NG Tube EXAM: PORTABLE ABDOMEN - 1 VIEW COMPARISON:  Abdominal radiograph 12/21/22 FINDINGS: Enteric tube courses below diaphragm with side hole and tip projecting over the expected location of the stomach. Paucity of bowel gas limits the ability to assess for obstruction, but within this limitation. No radio-opaque calculi or other significant radiographic abnormality are seen. Lung bases are clear IMPRESSION: Enteric tube courses below diaphragm with side hole and tip projecting over the expected location of the stomach. Electronically Signed   By: Lorenza Cambridge M.D.   On: 12/28/2022 12:24   VAS US RENAL ARTERY DUPLEX  Result Date: 12/26/2022 ABDOMINAL VISCERAL Patient Name:  [name] [name]  Date of Exam:   12/26/2022 Medical Rec #: [id number]      Accession #:    [id number] Date of Birth: 09/24/1955      Patient Gender: M Patient Age:   16 years Exam Location:  [facility] Procedure:      VAS US RENAL ARTERY DUPLEX Referring Phys: [id number] JOSHUA E ROBINS -------------------------------------------------------------------------------- Indications: Concerned for right renal artery occlusion. High Risk Factors: Hypertension, hyperlipidemia. Vascular Interventions: SMA stenting for acute mesenteric ischemia on 12/23/22. Limitations: Air/bowel gas, obesity and BANDAGES IN PLACE FROM SURGERY ON 12/25/22 -  RE-EXPLORATION LAPAROTOMY W/CLOSURE (Abdomen) SMALL BOWEL RESECTION W/ANASTOMOSIS. Comparison Study: Renal duplex on 12/05/22 - Patent renal arteries bilaterally                   with small right kidney. Performing Technologist: Erin Hearing  Examination Guidelines: A complete evaluation includes B-mode imaging, spectral Doppler, color Doppler, and power Doppler as needed of all accessible portions of each vessel. Bilateral testing is considered an integral part of a complete examination. Limited examinations for reoccurring indications may be performed as noted.  Duplex Findings:  Mesenteric Technologist observations: Aorta and mesenteric and celiac arteries not visualized due to s/p surgery.   +------------------+--------+--------+--------------+ Right Renal ArteryPSV cm/sEDV cm/s   Comment     +------------------+--------+--------+--------------+ Origin  COMPLICATIONS none IMPRESSION: 1. Technically successful right IJ Mahurkar catheter placement. Electronically Signed    By: Corlis Leak M.D.   On: 01/13/2023 09:29   DG Chest Port 1 View  Result Date: 01/09/2023 CLINICAL DATA:  Shortness of breath with abdominal pain for several days. History of hypertension. EXAM: PORTABLE CHEST 1 VIEW COMPARISON:  Radiographs 12/29/2022 and 12/23/2022. Chest CT 12/29/2022. FINDINGS: 1121 hours. Interval extubation and removal of the enteric tube. Right IJ central venous catheter has been removed. Left IJ central venous catheter appears unchanged, projecting to the mid SVC level. The heart size and mediastinal contours are stable. There are persistent low lung volumes. The lungs appear clear. No pleural effusion or pneumothorax. The bones appear unchanged, without acute findings. Telemetry leads overlie the chest. IMPRESSION: Interval extubation and removal of the enteric tube and right IJ central venous catheter. No acute cardiopulmonary process. Electronically Signed   By: Carey Bullocks M.D.   On: 01/09/2023 13:23   DG Swallowing Func-Speech Pathology  Result Date: 01/08/2023 Table formatting from the original result was not included. Modified Barium Swallow Study Patient Details Name: [name] [name] MRN: [id number] Date of Birth: 1956/02/23 Today's Date: 01/08/2023 HPI/PMH: HPI: Pt is a 67 yo male presenting to ED from OP vascular surgery office with known mesenteric artery stenosis s/p SMA angiogram. Admitted 9/11 with several week history of abdominal pain with 30 lb weight loss. W/u included mesenteric ischemia and necrotic area of jejunum s/p exploratory laparotomy and small bowel resection 9/12, reopening of laparotomy 9/14, second reopening and small bowel resection with anastomosis x2 9/26. Intubated 9/12-9/22. PMH includes testicular cancer, HTN, retinal detachment, GERD, HTN, HLD Clinical Impression: Clinical Impression: Pt presents with a suspected cognitively based oral dysphagia, although his swallow is overall functional. Suspect the taste of the barium contributed to oral  holding and gagging, although oral holding of purees was also noted at bedside. Only one instance of trace, transient penetration occurred as pt was attempting to swallow the pill with thin liquids and seemingly gagged. He exhibited great airway protection throughout this event and no further penetration/aspiration was observed throughout trials of nectar or honey thick liquids, purees, and solids. Pt required frequent breaks and had increased WOB at times. Given pt's fatigue and cognition, recommend initiating a diet of full liquids with good potential to upgrade as mentation improves. He will require total assistance for feeding. Question pt's ability to attend throughout the course of a meal and achieve adequate oral intake at this time, therefore, he may require continued TPN until adequate intake is achieved. Will continue to follow to target diet toleration and upgrade as able. Factors that may increase risk of adverse event in presence of aspiration Rubye Oaks & Clearance Coots 2021): Factors that may increase risk of adverse event in presence of aspiration Rubye Oaks & Clearance Coots 2021): Respiratory or GI disease; Reduced cognitive function; Frail or deconditioned; Dependence for feeding and/or oral hygiene Recommendations/Plan: Swallowing Evaluation Recommendations Swallowing Evaluation Recommendations Recommendations: PO diet PO Diet Recommendation: Full liquid diet Liquid Administration via: Spoon; Cup; Straw Medication Administration: Whole meds with puree Supervision: Full assist for feeding; Full supervision/cueing for swallowing strategies Swallowing strategies  : Minimize environmental distractions; Slow rate; Small bites/sips Postural changes: Position pt fully upright for meals Oral care recommendations: Oral care BID (2x/day); Staff/trained caregiver to provide oral care Treatment Plan Treatment Plan Treatment recommendations: Therapy as outlined in treatment plan below Follow-up recommendations: Acute inpatient  rehab (3 hours/day) Functional status assessment: Patient has had a recent  No midline shift, ventriculomegaly, mass effect, evidence of mass lesion, intracranial hemorrhage or evidence of cortically based acute infarction. Gray-white matter differentiation is within normal limits throughout the brain. Vascular: Calcified atherosclerosis at the skull base. No suspicious intracranial vascular hyperdensity. Skull: No acute osseous abnormality identified. Sinuses/Orbits: Right nasoenteric tube in place. Subtotal opacified right maxillary sinus with mild bubbly opacity. Bubbly opacity in the left sphenoid. Other paranasal sinuses are well aerated. Mild left mastoid effusion. Tympanic cavities remain clear. Other: No acute orbit or scalp soft tissue finding. IMPRESSION: 1. Normal for age noncontrast CT appearance of the brain. 2. Right nasoenteric tube in place. Mild paranasal sinus inflammation, left mastoid effusion. Electronically Signed   By: Odessa Fleming M.D.   On: 12/29/2022 09:48   DG CHEST PORT 1 VIEW  Result Date: 12/29/2022 CLINICAL DATA:  Endotracheally intubated. EXAM: PORTABLE CHEST 1 VIEW COMPARISON:  Radiograph  12/23/2022 FINDINGS: The endotracheal tube tip is at the level of the clavicular heads 6.7 cm from the carina. Tip of the enteric tube is below the diaphragm in the stomach. Unchanged positioning of bilateral central lines. Stable heart size and mediastinal contours. No pneumothorax. Improvement in bibasilar atelectasis without confluent airspace disease. No significant pleural effusion. IMPRESSION: 1. Stable support apparatus. 2. Improvement in bibasilar atelectasis. Electronically Signed   By: Narda Rutherford M.D.   On: 12/29/2022 09:46   DG Abd Portable 1V  Result Date: 12/28/2022 CLINICAL DATA:  NG Tube EXAM: PORTABLE ABDOMEN - 1 VIEW COMPARISON:  Abdominal radiograph 12/21/22 FINDINGS: Enteric tube courses below diaphragm with side hole and tip projecting over the expected location of the stomach. Paucity of bowel gas limits the ability to assess for obstruction, but within this limitation. No radio-opaque calculi or other significant radiographic abnormality are seen. Lung bases are clear IMPRESSION: Enteric tube courses below diaphragm with side hole and tip projecting over the expected location of the stomach. Electronically Signed   By: Lorenza Cambridge M.D.   On: 12/28/2022 12:24   VAS US RENAL ARTERY DUPLEX  Result Date: 12/26/2022 ABDOMINAL VISCERAL Patient Name:  [name] [name]  Date of Exam:   12/26/2022 Medical Rec #: [id number]      Accession #:    [id number] Date of Birth: 09/24/1955      Patient Gender: M Patient Age:   16 years Exam Location:  [facility] Procedure:      VAS US RENAL ARTERY DUPLEX Referring Phys: [id number] JOSHUA E ROBINS -------------------------------------------------------------------------------- Indications: Concerned for right renal artery occlusion. High Risk Factors: Hypertension, hyperlipidemia. Vascular Interventions: SMA stenting for acute mesenteric ischemia on 12/23/22. Limitations: Air/bowel gas, obesity and BANDAGES IN PLACE FROM SURGERY ON 12/25/22 -  RE-EXPLORATION LAPAROTOMY W/CLOSURE (Abdomen) SMALL BOWEL RESECTION W/ANASTOMOSIS. Comparison Study: Renal duplex on 12/05/22 - Patent renal arteries bilaterally                   with small right kidney. Performing Technologist: Erin Hearing  Examination Guidelines: A complete evaluation includes B-mode imaging, spectral Doppler, color Doppler, and power Doppler as needed of all accessible portions of each vessel. Bilateral testing is considered an integral part of a complete examination. Limited examinations for reoccurring indications may be performed as noted.  Duplex Findings:  Mesenteric Technologist observations: Aorta and mesenteric and celiac arteries not visualized due to s/p surgery.   +------------------+--------+--------+--------------+ Right Renal ArteryPSV cm/sEDV cm/s   Comment     +------------------+--------+--------+--------------+ Origin  No midline shift, ventriculomegaly, mass effect, evidence of mass lesion, intracranial hemorrhage or evidence of cortically based acute infarction. Gray-white matter differentiation is within normal limits throughout the brain. Vascular: Calcified atherosclerosis at the skull base. No suspicious intracranial vascular hyperdensity. Skull: No acute osseous abnormality identified. Sinuses/Orbits: Right nasoenteric tube in place. Subtotal opacified right maxillary sinus with mild bubbly opacity. Bubbly opacity in the left sphenoid. Other paranasal sinuses are well aerated. Mild left mastoid effusion. Tympanic cavities remain clear. Other: No acute orbit or scalp soft tissue finding. IMPRESSION: 1. Normal for age noncontrast CT appearance of the brain. 2. Right nasoenteric tube in place. Mild paranasal sinus inflammation, left mastoid effusion. Electronically Signed   By: Odessa Fleming M.D.   On: 12/29/2022 09:48   DG CHEST PORT 1 VIEW  Result Date: 12/29/2022 CLINICAL DATA:  Endotracheally intubated. EXAM: PORTABLE CHEST 1 VIEW COMPARISON:  Radiograph  12/23/2022 FINDINGS: The endotracheal tube tip is at the level of the clavicular heads 6.7 cm from the carina. Tip of the enteric tube is below the diaphragm in the stomach. Unchanged positioning of bilateral central lines. Stable heart size and mediastinal contours. No pneumothorax. Improvement in bibasilar atelectasis without confluent airspace disease. No significant pleural effusion. IMPRESSION: 1. Stable support apparatus. 2. Improvement in bibasilar atelectasis. Electronically Signed   By: Narda Rutherford M.D.   On: 12/29/2022 09:46   DG Abd Portable 1V  Result Date: 12/28/2022 CLINICAL DATA:  NG Tube EXAM: PORTABLE ABDOMEN - 1 VIEW COMPARISON:  Abdominal radiograph 12/21/22 FINDINGS: Enteric tube courses below diaphragm with side hole and tip projecting over the expected location of the stomach. Paucity of bowel gas limits the ability to assess for obstruction, but within this limitation. No radio-opaque calculi or other significant radiographic abnormality are seen. Lung bases are clear IMPRESSION: Enteric tube courses below diaphragm with side hole and tip projecting over the expected location of the stomach. Electronically Signed   By: Lorenza Cambridge M.D.   On: 12/28/2022 12:24   VAS US RENAL ARTERY DUPLEX  Result Date: 12/26/2022 ABDOMINAL VISCERAL Patient Name:  [name] [name]  Date of Exam:   12/26/2022 Medical Rec #: [id number]      Accession #:    [id number] Date of Birth: 09/24/1955      Patient Gender: M Patient Age:   16 years Exam Location:  [facility] Procedure:      VAS US RENAL ARTERY DUPLEX Referring Phys: [id number] JOSHUA E ROBINS -------------------------------------------------------------------------------- Indications: Concerned for right renal artery occlusion. High Risk Factors: Hypertension, hyperlipidemia. Vascular Interventions: SMA stenting for acute mesenteric ischemia on 12/23/22. Limitations: Air/bowel gas, obesity and BANDAGES IN PLACE FROM SURGERY ON 12/25/22 -  RE-EXPLORATION LAPAROTOMY W/CLOSURE (Abdomen) SMALL BOWEL RESECTION W/ANASTOMOSIS. Comparison Study: Renal duplex on 12/05/22 - Patent renal arteries bilaterally                   with small right kidney. Performing Technologist: Erin Hearing  Examination Guidelines: A complete evaluation includes B-mode imaging, spectral Doppler, color Doppler, and power Doppler as needed of all accessible portions of each vessel. Bilateral testing is considered an integral part of a complete examination. Limited examinations for reoccurring indications may be performed as noted.  Duplex Findings:  Mesenteric Technologist observations: Aorta and mesenteric and celiac arteries not visualized due to s/p surgery.   +------------------+--------+--------+--------------+ Right Renal ArteryPSV cm/sEDV cm/s   Comment     +------------------+--------+--------+--------------+ Origin  No midline shift, ventriculomegaly, mass effect, evidence of mass lesion, intracranial hemorrhage or evidence of cortically based acute infarction. Gray-white matter differentiation is within normal limits throughout the brain. Vascular: Calcified atherosclerosis at the skull base. No suspicious intracranial vascular hyperdensity. Skull: No acute osseous abnormality identified. Sinuses/Orbits: Right nasoenteric tube in place. Subtotal opacified right maxillary sinus with mild bubbly opacity. Bubbly opacity in the left sphenoid. Other paranasal sinuses are well aerated. Mild left mastoid effusion. Tympanic cavities remain clear. Other: No acute orbit or scalp soft tissue finding. IMPRESSION: 1. Normal for age noncontrast CT appearance of the brain. 2. Right nasoenteric tube in place. Mild paranasal sinus inflammation, left mastoid effusion. Electronically Signed   By: Odessa Fleming M.D.   On: 12/29/2022 09:48   DG CHEST PORT 1 VIEW  Result Date: 12/29/2022 CLINICAL DATA:  Endotracheally intubated. EXAM: PORTABLE CHEST 1 VIEW COMPARISON:  Radiograph  12/23/2022 FINDINGS: The endotracheal tube tip is at the level of the clavicular heads 6.7 cm from the carina. Tip of the enteric tube is below the diaphragm in the stomach. Unchanged positioning of bilateral central lines. Stable heart size and mediastinal contours. No pneumothorax. Improvement in bibasilar atelectasis without confluent airspace disease. No significant pleural effusion. IMPRESSION: 1. Stable support apparatus. 2. Improvement in bibasilar atelectasis. Electronically Signed   By: Narda Rutherford M.D.   On: 12/29/2022 09:46   DG Abd Portable 1V  Result Date: 12/28/2022 CLINICAL DATA:  NG Tube EXAM: PORTABLE ABDOMEN - 1 VIEW COMPARISON:  Abdominal radiograph 12/21/22 FINDINGS: Enteric tube courses below diaphragm with side hole and tip projecting over the expected location of the stomach. Paucity of bowel gas limits the ability to assess for obstruction, but within this limitation. No radio-opaque calculi or other significant radiographic abnormality are seen. Lung bases are clear IMPRESSION: Enteric tube courses below diaphragm with side hole and tip projecting over the expected location of the stomach. Electronically Signed   By: Lorenza Cambridge M.D.   On: 12/28/2022 12:24   VAS US RENAL ARTERY DUPLEX  Result Date: 12/26/2022 ABDOMINAL VISCERAL Patient Name:  [name] [name]  Date of Exam:   12/26/2022 Medical Rec #: [id number]      Accession #:    [id number] Date of Birth: 09/24/1955      Patient Gender: M Patient Age:   16 years Exam Location:  [facility] Procedure:      VAS US RENAL ARTERY DUPLEX Referring Phys: [id number] JOSHUA E ROBINS -------------------------------------------------------------------------------- Indications: Concerned for right renal artery occlusion. High Risk Factors: Hypertension, hyperlipidemia. Vascular Interventions: SMA stenting for acute mesenteric ischemia on 12/23/22. Limitations: Air/bowel gas, obesity and BANDAGES IN PLACE FROM SURGERY ON 12/25/22 -  RE-EXPLORATION LAPAROTOMY W/CLOSURE (Abdomen) SMALL BOWEL RESECTION W/ANASTOMOSIS. Comparison Study: Renal duplex on 12/05/22 - Patent renal arteries bilaterally                   with small right kidney. Performing Technologist: Erin Hearing  Examination Guidelines: A complete evaluation includes B-mode imaging, spectral Doppler, color Doppler, and power Doppler as needed of all accessible portions of each vessel. Bilateral testing is considered an integral part of a complete examination. Limited examinations for reoccurring indications may be performed as noted.  Duplex Findings:  Mesenteric Technologist observations: Aorta and mesenteric and celiac arteries not visualized due to s/p surgery.   +------------------+--------+--------+--------------+ Right Renal ArteryPSV cm/sEDV cm/s   Comment     +------------------+--------+--------+--------------+ Origin  No midline shift, ventriculomegaly, mass effect, evidence of mass lesion, intracranial hemorrhage or evidence of cortically based acute infarction. Gray-white matter differentiation is within normal limits throughout the brain. Vascular: Calcified atherosclerosis at the skull base. No suspicious intracranial vascular hyperdensity. Skull: No acute osseous abnormality identified. Sinuses/Orbits: Right nasoenteric tube in place. Subtotal opacified right maxillary sinus with mild bubbly opacity. Bubbly opacity in the left sphenoid. Other paranasal sinuses are well aerated. Mild left mastoid effusion. Tympanic cavities remain clear. Other: No acute orbit or scalp soft tissue finding. IMPRESSION: 1. Normal for age noncontrast CT appearance of the brain. 2. Right nasoenteric tube in place. Mild paranasal sinus inflammation, left mastoid effusion. Electronically Signed   By: Odessa Fleming M.D.   On: 12/29/2022 09:48   DG CHEST PORT 1 VIEW  Result Date: 12/29/2022 CLINICAL DATA:  Endotracheally intubated. EXAM: PORTABLE CHEST 1 VIEW COMPARISON:  Radiograph  12/23/2022 FINDINGS: The endotracheal tube tip is at the level of the clavicular heads 6.7 cm from the carina. Tip of the enteric tube is below the diaphragm in the stomach. Unchanged positioning of bilateral central lines. Stable heart size and mediastinal contours. No pneumothorax. Improvement in bibasilar atelectasis without confluent airspace disease. No significant pleural effusion. IMPRESSION: 1. Stable support apparatus. 2. Improvement in bibasilar atelectasis. Electronically Signed   By: Narda Rutherford M.D.   On: 12/29/2022 09:46   DG Abd Portable 1V  Result Date: 12/28/2022 CLINICAL DATA:  NG Tube EXAM: PORTABLE ABDOMEN - 1 VIEW COMPARISON:  Abdominal radiograph 12/21/22 FINDINGS: Enteric tube courses below diaphragm with side hole and tip projecting over the expected location of the stomach. Paucity of bowel gas limits the ability to assess for obstruction, but within this limitation. No radio-opaque calculi or other significant radiographic abnormality are seen. Lung bases are clear IMPRESSION: Enteric tube courses below diaphragm with side hole and tip projecting over the expected location of the stomach. Electronically Signed   By: Lorenza Cambridge M.D.   On: 12/28/2022 12:24   VAS US RENAL ARTERY DUPLEX  Result Date: 12/26/2022 ABDOMINAL VISCERAL Patient Name:  [name] [name]  Date of Exam:   12/26/2022 Medical Rec #: [id number]      Accession #:    [id number] Date of Birth: 09/24/1955      Patient Gender: M Patient Age:   16 years Exam Location:  [facility] Procedure:      VAS US RENAL ARTERY DUPLEX Referring Phys: [id number] JOSHUA E ROBINS -------------------------------------------------------------------------------- Indications: Concerned for right renal artery occlusion. High Risk Factors: Hypertension, hyperlipidemia. Vascular Interventions: SMA stenting for acute mesenteric ischemia on 12/23/22. Limitations: Air/bowel gas, obesity and BANDAGES IN PLACE FROM SURGERY ON 12/25/22 -  RE-EXPLORATION LAPAROTOMY W/CLOSURE (Abdomen) SMALL BOWEL RESECTION W/ANASTOMOSIS. Comparison Study: Renal duplex on 12/05/22 - Patent renal arteries bilaterally                   with small right kidney. Performing Technologist: Erin Hearing  Examination Guidelines: A complete evaluation includes B-mode imaging, spectral Doppler, color Doppler, and power Doppler as needed of all accessible portions of each vessel. Bilateral testing is considered an integral part of a complete examination. Limited examinations for reoccurring indications may be performed as noted.  Duplex Findings:  Mesenteric Technologist observations: Aorta and mesenteric and celiac arteries not visualized due to s/p surgery.   +------------------+--------+--------+--------------+ Right Renal ArteryPSV cm/sEDV cm/s   Comment     +------------------+--------+--------+--------------+ Origin

## 2023-01-16 ENCOUNTER — Inpatient Hospital Stay (HOSPITAL_COMMUNITY): Payer: Medicare Other

## 2023-01-16 DIAGNOSIS — K551 Chronic vascular disorders of intestine: Secondary | ICD-10-CM | POA: Diagnosis not present

## 2023-01-16 HISTORY — PX: IR FLUORO GUIDE CV LINE RIGHT: IMG2283

## 2023-01-16 LAB — GLUCOSE, CAPILLARY
Glucose-Capillary: 129 mg/dL — ABNORMAL HIGH (ref 70–99)
Glucose-Capillary: 131 mg/dL — ABNORMAL HIGH (ref 70–99)
Glucose-Capillary: 137 mg/dL — ABNORMAL HIGH (ref 70–99)
Glucose-Capillary: 142 mg/dL — ABNORMAL HIGH (ref 70–99)

## 2023-01-16 LAB — HEPATITIS B SURFACE ANTIGEN: Hepatitis B Surface Ag: NONREACTIVE

## 2023-01-16 MED ORDER — CEFAZOLIN SODIUM-DEXTROSE 2-4 GM/100ML-% IV SOLN
INTRAVENOUS | Status: AC | PRN
Start: 2023-01-16 — End: 2023-01-16
  Administered 2023-01-16: 2 g via INTRAVENOUS

## 2023-01-16 MED ORDER — TRACE MINERALS CU-MN-SE-ZN 300-55-60-3000 MCG/ML IV SOLN
INTRAVENOUS | Status: AC
  Filled 2023-01-16: qty 934.4

## 2023-01-16 MED ORDER — MIDAZOLAM HCL 2 MG/2ML IJ SOLN
INTRAMUSCULAR | Status: AC
  Filled 2023-01-16: qty 4

## 2023-01-16 MED ORDER — MIDAZOLAM HCL 2 MG/2ML IJ SOLN
INTRAMUSCULAR | Status: AC | PRN
Start: 2023-01-16 — End: 2023-01-16
  Administered 2023-01-16: 1 mg via INTRAVENOUS

## 2023-01-16 MED ORDER — FENTANYL CITRATE (PF) 100 MCG/2ML IJ SOLN
INTRAMUSCULAR | Status: AC
  Filled 2023-01-16: qty 4

## 2023-01-16 MED ORDER — FENTANYL CITRATE (PF) 100 MCG/2ML IJ SOLN
INTRAMUSCULAR | Status: AC | PRN
Start: 2023-01-16 — End: 2023-01-16
  Administered 2023-01-16: 50 ug via INTRAVENOUS

## 2023-01-16 MED ORDER — CEFAZOLIN SODIUM-DEXTROSE 2-4 GM/100ML-% IV SOLN
INTRAVENOUS | Status: AC
  Filled 2023-01-16: qty 100

## 2023-01-16 MED ORDER — HEPARIN SODIUM (PORCINE) 1000 UNIT/ML IJ SOLN
10.0000 mL | Freq: Once | INTRAMUSCULAR | Status: AC
  Administered 2023-01-16: 3.2 mL

## 2023-01-16 MED ORDER — LIDOCAINE HCL 1 % IJ SOLN
20.0000 mL | Freq: Once | INTRAMUSCULAR | Status: AC
  Administered 2023-01-16: 10 mL

## 2023-01-16 MED ORDER — LIDOCAINE-EPINEPHRINE 1 %-1:100000 IJ SOLN
INTRAMUSCULAR | Status: AC
  Filled 2023-01-16: qty 1

## 2023-01-16 MED ORDER — HEPARIN SODIUM (PORCINE) 1000 UNIT/ML IJ SOLN
INTRAMUSCULAR | Status: AC
  Filled 2023-01-16: qty 10

## 2023-01-16 MED ORDER — CHLORHEXIDINE GLUCONATE 4 % EX SOLN
CUTANEOUS | Status: AC
  Filled 2023-01-16: qty 15

## 2023-01-16 NOTE — Progress Notes (Signed)
   01/16/23 1349  Vitals  BP (!) 159/75  MAP (mmHg) 99  BP Location Left Arm  BP Method Automatic  Patient Position (if appropriate) Lying  Pulse Rate 77  Pulse Rate Source Monitor  ECG Heart Rate 79  Resp 17  Oxygen Therapy  SpO2 93 %  Post Treatment  Dialyzer Clearance Clear  Hemodialysis Intake (mL) 0 mL  Liters Processed 45.9  Fluid Removed (mL) 1500 mL  Tolerated HD Treatment Yes  Post-Hemodialysis Comments tolerated well new cathert placed today , has to run in reverse  Hemodialysis Catheter Right Internal jugular Double lumen Permanent (Tunneled)  Placement Date/Time: 01/16/23 0851   Serial / Lot #: [id number]  Expiration Date: 07/09/27  Time Out: Correct patient;Correct site;Correct procedure  Maximum sterile barrier precautions: Hand hygiene;Large sterile sheet;Cap;Sterile probe cover;Mask;Ste...  Site Condition No complications  Blue Lumen Status Flushed;Heparin locked  Red Lumen Status Flushed;Heparin locked  Purple Lumen Status N/A  Catheter fill solution Heparin 1000 units/ml  Catheter fill volume (Arterial) 1.6 cc  Catheter fill volume (Venous) 1.6  Dressing Type Transparent  Dressing Status Antimicrobial disc in place  Drainage Description None  Dressing Change Due 01/23/23  Post treatment catheter status Capped and Clamped   Received patient in bed to unit.  Alert and oriented.  Informed consent signed and in chart.   TX duration:3  Patient tolerated well.  Transported back to the room  Alert, without acute distress.  Hand-off given to patient's nurse.   Access used: R HD catheter Access issues: placed in reverse  Total UF removed: 1500 Medication(s) given: none Post HD VS: [email or web address] above Post HD weight: n/a   [facility]

## 2023-01-16 NOTE — Progress Notes (Signed)
SLP Cancellation Note  Patient Details Name: [name] [name] MRN: [id number] DOB: 10/25/1955   Cancelled treatment:        Pt currently in HD. Will continue efforts.    Royce Macadamia 01/16/2023, 11:47 AM

## 2023-01-16 NOTE — Progress Notes (Signed)
Patient ID: [name] [name], male   DOB: 23-Jul-1955, 67 y.o.   MRN: [id number] [facility] Surgery Progress Note  6 Days Post-Op  Subjective: Seen in HD.  Just got tunneled HD line.  Says he's eating better.  Calorie count in process.  No further bloody BMs by his report.  Objective: Vital signs in last 24 hours: Temp:  [98 F (36.7 C)-98.3 F (36.8 C)] 98 F (36.7 C) (10/08 0317) Pulse Rate:  [78-88] 88 (10/08 0900) Resp:  [14-18] 16 (10/08 0900) BP: (146-174)/(68-86) 147/83 (10/08 0900) SpO2:  [92 %-100 %] 94 % (10/08 0900) Last BM Date : 01/15/23  Intake/Output from previous day: 10/07 0701 - 10/08 0700 In: 1530.8 [P.O.:480; I.V.:1050.8] Out: 3200 [Urine:3200] Intake/Output this shift: No intake/output data recorded.  PE: Gen: alert, NAD Pulm: respirations unlabored  Abd: soft, nondistended, nontender, open midline incision almost healed.  Some sutures visible up through wound.   Lab Results:  Recent Labs    01/14/23 0403 01/14/23 1620 01/15/23 0520  WBC 6.3  --  5.9  HGB 6.9* 7.5* 8.1*  HCT 20.9* 22.7* 24.1*  PLT 168  --  172   BMET Recent Labs    01/14/23 0403 01/15/23 0520  NA 137 140  K 3.9 4.1  CL 100 99  CO2 25 28  GLUCOSE 127* 138*  BUN 110* 131*  CREATININE 3.17* 4.13*  CALCIUM 7.7* 8.1*   PT/INR No results for input(s): "LABPROT", "INR" in the last 72 hours. CMP     Component Value Date/Time   NA 140 01/15/2023 0520   K 4.1 01/15/2023 0520   CL 99 01/15/2023 0520   CO2 28 01/15/2023 0520   GLUCOSE 138 (H) 01/15/2023 0520   BUN 131 (H) 01/15/2023 0520   CREATININE 4.13 (H) 01/15/2023 0520   CALCIUM 8.1 (L) 01/15/2023 0520   PROT 4.8 (L) 01/15/2023 0520   PROT 6.3 03/16/2020 0800   ALBUMIN 1.6 (L) 01/15/2023 0520   ALBUMIN 4.3 03/16/2020 0800   AST 24 01/15/2023 0520   ALT 56 (H) 01/15/2023 0520   ALKPHOS 75 01/15/2023 0520   BILITOT 0.4 01/15/2023 0520   BILITOT 0.8 03/16/2020 0800   GFRNONAA 15 (L) 01/15/2023 0520   GFRAA  >60 04/14/2015 1127   Lipase     Component Value Date/Time   LIPASE 28 11/27/2022 0755       Studies/Results: No results found.  Anti-infectives: Anti-infectives (From admission, onward)    Start     Dose/Rate Route Frequency Ordered Stop   01/16/23 0820  ceFAZolin (ANCEF) IVPB 2g/100 mL premix        over 30 Minutes Intravenous Continuous PRN 01/16/23 0845 01/16/23 0855   01/15/23 1000  ceFAZolin (ANCEF) IVPB 2g/100 mL premix       Note to Pharmacy: Give in IR for surgical prophylaxis   2 g 200 mL/hr over 30 Minutes Intravenous  Once 01/14/23 1818     12/30/22 1800  vancomycin (VANCOCIN) IVPB 1000 mg/200 mL premix        1,000 mg 200 mL/hr over 60 Minutes Intravenous Every 24 hours 12/29/22 1844 01/03/23 1946   12/30/22 0600  vancomycin (VANCOCIN) IVPB 1000 mg/200 mL premix  Status:  Discontinued        1,000 mg 200 mL/hr over 60 Minutes Intravenous Every 12 hours 12/29/22 1842 12/29/22 1844   12/29/22 2200  meropenem (MERREM) 1 g in sodium chloride 0.9 % 100 mL IVPB  Status:  Discontinued  1 g 200 mL/hr over 30 Minutes Intravenous Every 12 hours 12/29/22 1705 12/29/22 1842   12/29/22 1930  meropenem (MERREM) 1 g in sodium chloride 0.9 % 100 mL IVPB        1 g 200 mL/hr over 30 Minutes Intravenous Every 8 hours 12/29/22 1842 01/04/23 0700   12/29/22 1800  vancomycin (VANCOREADY) IVPB 2000 mg/400 mL        2,000 mg 200 mL/hr over 120 Minutes Intravenous  Once 12/29/22 1705 12/29/22 1958   12/23/22 1230  piperacillin-tazobactam (ZOSYN) IVPB 2.25 g        2.25 g 100 mL/hr over 30 Minutes Intravenous Every 8 hours 12/23/22 1139 12/28/22 2109   12/22/22 0000  piperacillin-tazobactam (ZOSYN) IVPB 3.375 g  Status:  Discontinued        3.375 g 12.5 mL/hr over 240 Minutes Intravenous Every 8 hours 12/21/22 2331 12/23/22 1139        Assessment/Plan 67 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 Karin Lieu,  exploratory laparotomy and small bowel resection Dr. Janee Morn -S/p re-exploration, replace abthera FB 12/23/22   -S/p re-exploration, small bowel resection with anastomosis x2, assessment of bowel perfusion with ICG, abdominal closure 9/16 Dr. Andrey Campanile - BRBPR s/p egd/colonoscopy 10/2 that revealed severe ulceration in the rectum and recto- sigmoid colon, no active bleeding at the time. Biopsy showed ulceration with inflammatory changes consistent with ischemia and no viral cytopathic effect, stains pending - No further rectal bleeding.  - hgb overall stable.  Will check in am -cont diet and calorie count, which remains on the door.  If this is good, then wean to stop TNA.  He states he is eating better. - Would hold off on anticoagulation for now given all of his bleeding issues. Given ongoing anemic issues, would hold off on anticoagulation indefinitely since the risk of bleeding outweigh the benefits in the circumstance. - daily wet to dry dressing changes to midline abdominal wound   FEN: reg, TPN VTE: ASA PR, bivalirudin on hold currently  ID: No current abx    LOS: 27 days    Letha Cape, [facility] Surgery 01/16/2023, 9:50 AM Please see Amion for pager number during day hours 7:00am-4:30pm

## 2023-01-16 NOTE — Progress Notes (Signed)
Nutrition Follow-up  DOCUMENTATION CODES:   Severe malnutrition in context of acute illness/injury  INTERVENTION:  -Continue TPN at goal rate to meet 100% of estimated needs until patient is meeting at least 60% of his estimated nutrition needs PO. -Recommend continuing another 48 hours of calorie count to assist with weaning TPN. -Discontinue Ensure Enlive as pt reports he does not like these supplements. -Provide Boost Breeze po TID, each supplement provides 250 kcal and 9 grams of protein. -Provide PROSource Plus po BID, each supplement provides 100 kcal and 15 grams of protein. -Continue Magic cup TID with meals, each supplement provides 290 kcal and 9 grams of protein. Verified that these are ordered to come on each tray.   NUTRITION DIAGNOSIS:   Severe Malnutrition related to acute illness as evidenced by energy intake < or equal to 50% for > or equal to 5 days, percent weight loss.    GOAL:   Patient will meet greater than or equal to 90% of their needs    MONITOR:   Diet advancement, Labs, Weight trends, Skin, I & O's (TPN)  REASON FOR ASSESSMENT:   Ventilator, Consult New TPN/TNA  ASSESSMENT:   67 yo male admitted with SMA stenosis/thrombosis and SB ischemia requiring SMA thrombectomy and stent followed by ex lap and SB resection. Pt with poor po intake and weight loss for several weeks PTA. 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  Day 1: Lunch 10/4: 50% braised beef pot roast with gravy and mashed potatoes with gravy (169 kcal, 10.5 grams of protein) Dinner 10/4: 8 oz orange juice (60 kcal, 0 grams of protein) Breakfast 10/5: 100% plain coffee, 50% chocolate milk (75 kcal, 4 grams of protein) Supplements: N/A - pt not drinking  supplements   Total intake Day 1: 304 kcal (13% of minimum estimated needs)  14.5 grams of protein (10% of minimum estimated needs)    Day 2: Lunch 10/5: 50% [place] and ham sandwich with lettuce, tomato, and mayo, 50% chips, 50% chocolate milk (328 kcal, 13 grams of protein) Dinner 10/5: N/A - no meal ticket available, wife thinks pt did not eat this meal Breakfast 10/6: 1/2 blueberry muffin, 100% chocolate milk (235 kcal, 9 grams of protein) Supplements: N/A - pt not drinking supplements   Total intake Day 2: 563 kcal (24% of minimum estimated needs)  22 grams of protein (16% of minimum estimated needs)    Day 3: Lunch 10/6: 50% [place] and ham sandwich with lettuce and tomato, 100% chips, 75% chocolate milk (355 kcal, 16 grams of protein) Dinner 10/6: N/A - pt reports he did not eat this meal Breakfast 10/7: N/A - pt NPO after midnight for procedure so missed this meal Supplements: N/A - pt not drinking supplements   Total intake Day 3: 355 kcal (15% of minimum estimated needs)  16 grams of protein (11% of minimum estimated needs)  Day 4 and 5 NPO, dinner pending.     NUTRITION - FOCUSED PHYSICAL EXAM:  Flowsheet Row Most Recent Value  Orbital Region Mild depletion  Upper Arm Region Unable to assess  Thoracic and Lumbar Region No depletion  Buccal Region Unable to assess  Temple Region Mild depletion  Clavicle Bone Region Mild depletion  Clavicle and Acromion  Bone Region Mild depletion  Scapular Bone Region Unable to assess  Dorsal Hand Unable to assess  Patellar Region Mild depletion  Anterior Thigh Region Mild depletion  Posterior Calf Region Mild depletion  Edema (RD Assessment) Mild       Diet Order:   Diet Order             Diet regular Fluid consistency: Thin  Diet effective now                   EDUCATION NEEDS:   Not appropriate for education at this time  Skin:  Skin Assessment: Skin Integrity Issues: Skin Integrity Issues:: Stage III DTI:  n/a Stage II: n/a Stage III: R upper buttocks Wound Vac: n/a  Last BM:  10/2 x 2, type 7, red, brown, small  Height:   Ht Readings from Last 1 Encounters:  12/20/22 5\' 11"  (1.803 m)    Weight:   Wt Readings from Last 1 Encounters:  01/15/23 100.5 kg    Ideal Body Weight:     BMI:  Body mass index is 30.9 kg/m.  Estimated Nutritional Needs:   Kcal:  2300-2500 kcals  Protein:  140 -150 g  Fluid:  >/= 2.3 L    Jamelle Haring RDN, LDN Clinical Dietitian  RDN pager # available on Amion

## 2023-01-16 NOTE — Progress Notes (Signed)
Patient cleaned from incontinent episode after arrival back to unit from hemodialysis. Medium size stool noted to be black and red in color. Dr. Benjamine Mola made aware.

## 2023-01-16 NOTE — Progress Notes (Signed)
Elease Hashimoto RN instructed to removed unused PIV. Educated on increased risk of infection with unneeded access in place. Tomasita Morrow, RN VAST

## 2023-01-16 NOTE — Procedures (Signed)
Patient seen and examined on Hemodialysis. The procedure was supervised and I have made appropriate changes. BP (!) 163/81 (BP Location: Left Arm)   Pulse 64   Temp 98 F (36.7 C) (Oral)   Resp 14   Ht 5\' 11"  (1.803 m)   Wt 100.5 kg   SpO2 99%   BMI 30.90 kg/m   QB 300 ml/ min via TDC, running reversed, UF goal 2L  Tolerating treatment without complaints at this time.   [name] Buttner MD [facility] [phone] 12:09 PM

## 2023-01-16 NOTE — Progress Notes (Addendum)
PHARMACY - TOTAL PARENTERAL NUTRITION CONSULT NOTE  Indication:  SMA syndrome w associated weight loss  Patient Measurements: Height: 5\' 11"  (180.3 cm) Weight: 100.5 kg (221 lb 9 oz) IBW/kg (Calculated) : 75.3 TPN AdjBW (KG): 80.4 Body mass index is 30.9 kg/m. Usual Weight: 95 kg, has lost 50 lbs recently   Assessment:  67 yo M admitted with abdominal pain, weight loss, and poor PO intake. 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 He is now s/p SMA stent placement. He was initially left is discontinuity with open abdomen and was closed on 9/16. TPN was initiated 9/14.   Pt evaluated by speech therapy 9/29 - was able to tolerate trials of ice chips, thin liquid, and puree w/o overt signs or aspiration or difficulty swallowing.  MBS completed 9/30 - cognitive based oral dysphagia; recommended for trial of full liquids and upgrade as mentation allows. Requires full assistance for feeding until mentation improves.   Glucose / Insulin: A1c 6.3% - CBGs < 140; off insulin Electrolytes: Na 140, K 4.1, CO2 28 (max acetate in TPN), CoCa 10.02, Phos 4.5, Others wnl   Renal: off CRRT 9/24, iHD initiated, last HD 10/05. BUN 131  Hepatic: alk phos/tbili wnl, AST down 24, ALT down 56, albumin 1.6, TG 100 (wnl) Intake / Output; MIVF: UOP 1.3 mL/kg/hr, LBM 10/7 per charting (rectal bleed resolved). RBC 345 mL 10/6 due to drop in Hgb (down to 6.9)  GI Imaging:  9/20 CTA: improved ostial stenosis post stenting, thrombus/occlusion of SMA/ileocolic artery improving, surgical site has increased dilatation, L lung nodule GI Surgeries / Procedures:  9/14 re-ex lap, left in discontinuity with plans for re-exploration  9/16 re-ex lap, SBR with anastomosis x2, abdominal closure 10/2 EGD: gastric erythematous mucosa, no active bleeding  10/2 Colo: Deep and severe mucosal ulceration in the rectum and in the recto-sigmoid colon, likely ischemic or  infectious, not actively bleeding; Exam aborted in the descending colon 10/8 Tentative conversion to tunneled catheter   Central access: CVC LIJ 12/23/22 TPN start date: 12/23/22  Nutritional Goals: Goal concentrated TPN rate is 80 mL/hr (provides 140 g AA and 2400 kCal per day) Goal un-concentrated TPN is 100 mL/hr (provides 140g AA and 2253 kCal per day)  RD Estimated Needs Total Energy Estimated Needs: 2300-2500 kcals Total Protein Estimated Needs: 140 -150 g Total Fluid Estimated Needs: >/= 2.3 L  Current Nutrition:  TPN 10/2 CLD  10/3: (starting calorie count) regular diet, tolerating but not eating much 10/4-10/6: calorie count completed by RD, eating about 10-20% of calorie goals per RD note. Continue TPN. 10/7-10/8: of note NPO for procedure   Plan:  Continue concentrated TPN at goal rate of 80 ml/hr to meet 100% of needs Electrolytes in TPN: Na 100 mEq/L (add 9/30 given Na trend), K 20 mEq/L, Ca 5 mEq/L, Mg 7 mEq/L, Phos 0 mmol/L, Cl:Ac - max acetate.  Add standard MVI and trace elements to TPN Monitor TPN labs on Mon/Thurs F/u HD scheduled for 10/9 F/u PO intake/calorie count and wean TPN as able   Thank you for allowing pharmacy to be a part of this patient's care.  Laqueta Jean PharmD Candidate 01/16/2023 7:56 AM

## 2023-01-16 NOTE — Progress Notes (Signed)
[facility] Progress Note   Assessment/ Plan:   Acute kidney injury on CKD IIIb: Baseline creatinine level around 2.0.  Acute kidney injury likely ischemic ATN due to ex lap surgery/IV contrast/hypotension  Started on HD 9/18 due to inability to wean from vent and worsening renal function.  He was then switched to CRRT on 9/20-24. Course complicated by GI bleed.  Started on HD on 10/5 after nontunneled catheter placement with IR on 10/4 Reconsulted IR for a tunneled catheter- placed 10/8, appreciate assistance HD rolled over to today d/t high census on MWF- will keep TTS for now If he can tolerate sitting in a chair in his room he would like to try sitting in a chair for HD as early as this Wednesday, 10/9 Agree with not intervening on right RAS given that his right kidney is already much smaller than anticipated, discussed with VVS at that time. Likely not much function being contributed from right.   Strict ins and outs and daily weights Note that if contrast is clinically indicated urgently or emergently would then proceed with contrast  Acute mesenteric ischemia: Status post small bowel resection and superior mesenteric artery stent placement.  Followed by vascular surgery.  S/p OR again 9/16 for exlap and further small bowel resection, abd closed Metabolic acidosis - resolved Vent -dependent respiratory failure: Extubated and on room air and monitoring volume and resp status closely given large amount of inputs required.  Anemia due to acute blood loss: transfuse prn for hgb <7.  PRBC's per primary team. Multiple transfusions on 10/1.  S/p EGD and colonoscopy on 10/2. Getting PRBC's on 10/6.  Acute GI bleed - s/p PRBC's and GI is following.  S/p EGD and colonoscopy as above on 10/2 with severe mucosal ulceration in the rectum and rectosigmoid colon (felt ischemic or infectious per GI) Hypertension - shock resolved.  Avoid hypotension    Severe protein malnutrition - nutrition per  primary team Transaminitis - may have been setting of shock - improving   Subjective:    Seen on dialysis.  TDC earlier today.  Did well   Objective:   BP (!) 163/81 (BP Location: Left Arm)   Pulse 64   Temp 98 F (36.7 C) (Oral)   Resp 14   Ht 5\' 11"  (1.803 m)   Wt 100.5 kg   SpO2 99%   BMI 30.90 kg/m   Physical Exam: Gen:NAD, lying flat in bed CVS: RRR Resp: clear Abd: soft Ext: no LE edema ACCESS: tunneled internal jugular cathter R side   Labs: BMET Recent Labs  Lab 01/10/23 0333 01/11/23 0401 01/12/23 0400 01/13/23 0400 01/14/23 0403 01/15/23 0520  NA 139 138 135 138 137 140  K 3.8 3.7 4.5 4.8 3.9 4.1  CL 114* 111 105 104 100 99  CO2 18* 18* 17* 18* 25 28  GLUCOSE 148* 134* 125* 122* 127* 138*  BUN 120* 130* 147* 160* 110* 131*  CREATININE 2.73* 2.95* 3.51* 4.05* 3.17* 4.13*  CALCIUM 7.3* 7.6* 8.0* 8.3* 7.7* 8.1*  PHOS 4.0 4.2  --   --  4.3 4.5   CBC Recent Labs  Lab 01/12/23 1755 01/13/23 0400 01/14/23 0403 01/14/23 1620 01/15/23 0520  WBC 8.5 7.9 6.3  --  5.9  HGB 7.3* 7.6* 6.9* 7.5* 8.1*  HCT 22.4* 22.7* 20.9* 22.7* 24.1*  MCV 92.2 92.3 88.2  --  90.9  PLT 190 183 168  --  172      Medications:     (  feeding supplement) PROSource Plus  30 mL Oral BID BM   sodium chloride   Intravenous Once   amiodarone  200 mg Oral Daily   Chlorhexidine Gluconate Cloth  6 each Topical Q0600   feeding supplement  1 Container Oral TID BM   hydrALAZINE  25 mg Oral TID   pantoprazole (PROTONIX) IV  40 mg Intravenous Q12H   sodium chloride flush  3 mL Intravenous Q12H     Bufford Buttner, MD 01/16/2023, 12:08 PM

## 2023-01-16 NOTE — Plan of Care (Signed)

## 2023-01-16 NOTE — Progress Notes (Signed)
OT Cancellation Note  Patient Details Name: [name] [name] MRN: [id number] DOB: June 16, 1955   Cancelled Treatment:    Reason Eval/Treat Not Completed: Patient at procedure or test/ unavailable [name]  Acute Rehab Services  [phone] office number   Alphia Moh 01/16/2023, 10:20 AM

## 2023-01-16 NOTE — Progress Notes (Addendum)
Inpatient Rehab Admissions Coordinator:    CIR following at a distance, at this time, he is not tolerating therapy well enough that I think that he could do 3 hours of daily therapy on CIR; however, If Pt. Progresses with therapies, he may progress to being a good CIR candidate.  Wife updated  Megan Salon, MS, CCC-SLP Rehab Admissions Coordinator  [phone] (celll) [phone] (office)

## 2023-01-16 NOTE — Progress Notes (Signed)
PROGRESS NOTE    [name] [name]  BJY:[id number] DOB: 1955-09-03 DOA: 12/20/2022 PCP: [name], MD    Brief Narrative:  67 year old male with past medical history as below, which is significant for intestinal stromal tumor removal in 2022, known mesenteric stenosis, GERD, hypertension, and hyperlipidemia. In the few weeks prior to presentation on 9/11 he had been evaluated at several campuses for abdominal pain with noncontrasted CTs of the abdomen which were unrevealing. He continued to have abdominal pain, weight loss, and poor p.o. intake. He presented [name] on 9/11 and was admitted to the hospitalist for abdominal pain with concern for superior mesenteric artery thrombus. He was started on heparin infusion. Vascular surgery was consulted and ultimately a CT angiogram of the abdomen and pelvis was done 9/12 demonstrating acute occlusion of the distal SMA as well as occlusion of the inferior mesenteric artery. He was taken to the operating room emergently and and was found to have an necrotic area of the jejunum. This was resected, and SMA stent was placed, and the patient was left with an open abdomen. He also remained on the mechanical ventilator postoperatively and PCCM was asked to admit for ventilator management.  9/11 admitted with abdominal pain 9/12 CT demonstrating mesenteric occlusion, bowel resection and stent placed, to ICU on vent. Left in discontinuity  9/14 back to OR. Left in discontinuity as still concern about bowel integrity 9/16 back to OR  s sig areas of ischemia w/ 2 areas of perforation, another 66cm section of jejunum was resected and area of discontinuity was reanastomosed. Changed to argatroban post-op as unable to get heparin levels detectable  9/17 dropping RASS goal to 0 to -1, assessing for readiness to wean, serum creatinine and BUN still climbing some but making urine 9/18 iHD  9/20 rising pressor need, anemia: CTA abd: nothing emergent, transfused 9/22  extubated 9/25 poor tolerance of NGT clamping. Abx ended  9/26 trying clamping again. Adding PRN antihypertensives. Temp incr a bit, 100.6. holding off on HD and tunneled HD cath given incr UOP  9/26 transferred out of ICU 9/29 Argatroban stopped and held due to rectal bleeding 9/30 Hgb 6.1 at 1250, 2u PRBC ordered then had large bloody BM again overnight, repeat H/H pending. Was hypotensive initially to 70-80s but responded to 200cc IVF. Likely needs more blood, TRH to follow.  9/30 PCCM asked to see incase has ongoing bleeding and hypotension and needs ICU care again 10/1 ICU transfer for GI bleed. AC held.  10/2 endoscopy > no bleedin source identified 10/2 colonoscopy > deep ulcerations encompassing 80% on the observed bowel with stigmata of recent bleeding. 10/6-- HD restarted Eventual plan to CIR if able    Assessment and Plan:  Acute mesenteric ischemia  -s/p thrombectomy and SMA stent placement with ex lap and SB resection 9/12 followed by re-exploration x 2 on 9/14 and 9/16.  -Completed course of vanc/mero on 9/25. Per vascular surgery the majority of his bowel is surviving off a 3.3mm stent. - management per vascular surgery - avoid hypotension - AC if able from a GIB standpoint-- per surgery/GI-- will hold on this until Hbg stable and vascular in agreement- risk currently too much -daily wet to dry dressing changes to midline abdominal wound per GS   Postoperative GIB, ABLA  Colonoscopy 10/2 showeing deeps ulcerations in majority of the colon with evidence of bleeding likely due to ischemia.  - 1 unit PRBC 10/3  and again 10/6 - trend hgb - AC on  hold- see above   Afib now NSR -  on Amio gtt changed to PO--  outpatient cardiology follow up - anticoagulation on hold as above due to bleeding risk   HTN/HLD -resume home meds as able    AKI on CKD 3a, acidemia - nephro following - HD started- getting tunneled catheter   Physical deconditioning  - PT/OT     Nutrition - wean off TPN if eating better - diet resumed on 10/3   Obesity Estimated body mass index is 30.9 kg/m as calculated from the following:   Height as of this encounter: 5\' 11"  (1.803 m).   Weight as of this encounter: 100.5 kg.   Poor overall prognosis     DVT prophylaxis: Place and maintain sequential compression device Start: 01/14/23 1302 Place and maintain sequential compression device Start: 12/22/22 0130    Code Status: Full Code Family Communication: wife at bedside 10/8-- to go to CIR needs off TPN and to make progress with PT  Disposition Plan:  Level of care: Progressive Status is: Inpatient Remains inpatient appropriate     Consultants:  Vascular GI Nephrology PCCM CIR   Subjective: In HD-- feeling well-- was able to work with PT in bed yesterday   Objective: Vitals:   01/16/23 0937 01/16/23 1000 01/16/23 1030 01/16/23 1100  BP: (!) 158/75 135/78 137/77 (!) 152/78  Pulse: 80 78 78 79  Resp: 20 13 16 18   Temp:      TempSrc:      SpO2: 97% 95% 96% 99%  Weight:      Height:        Intake/Output Summary (Last 24 hours) at 01/16/2023 1114 Last data filed at 01/16/2023 0656 Gross per 24 hour  Intake 1530.8 ml  Output 1800 ml  Net -269.2 ml   Filed Weights   01/13/23 1711 01/14/23 0427 01/15/23 0440  Weight: (S) 101.6 kg 99.8 kg 100.5 kg    Examination:    General: Appearance:    Obese male in no acute distress     Lungs:     respirations unlabored  Heart:    Normal heart rate.   MS:   All extremities are intact.   Neurologic:   Awake, alert      Data Reviewed: I have personally reviewed following labs and imaging studies  CBC: Recent Labs  Lab 01/12/23 0400 01/12/23 1755 01/13/23 0400 01/14/23 0403 01/14/23 1620 01/15/23 0520  WBC 8.7 8.5 7.9 6.3  --  5.9  HGB 7.4* 7.3* 7.6* 6.9* 7.5* 8.1*  HCT 22.2* 22.4* 22.7* 20.9* 22.7* 24.1*  MCV 89.9 92.2 92.3 88.2  --  90.9  PLT 188 190 183 168  --  172   Basic  Metabolic Panel: Recent Labs  Lab 01/10/23 0333 01/11/23 0401 01/12/23 0400 01/13/23 0400 01/14/23 0403 01/15/23 0520  NA 139 138 135 138 137 140  K 3.8 3.7 4.5 4.8 3.9 4.1  CL 114* 111 105 104 100 99  CO2 18* 18* 17* 18* 25 28  GLUCOSE 148* 134* 125* 122* 127* 138*  BUN 120* 130* 147* 160* 110* 131*  CREATININE 2.73* 2.95* 3.51* 4.05* 3.17* 4.13*  CALCIUM 7.3* 7.6* 8.0* 8.3* 7.7* 8.1*  MG 1.9 2.2 2.3  --  2.0 2.2  PHOS 4.0 4.2  --   --  4.3 4.5   GFR: Estimated Creatinine Clearance: 20.7 mL/min (A) (by C-G formula based on SCr of 4.13 mg/dL (H)). Liver Function Tests: Recent Labs  Lab 01/10/23 [phone] 01/11/23 0401  01/13/23 0400 01/14/23 0403 01/15/23 0520  AST  --  50* 35  --  24  ALT  --  96* 81*  --  56*  ALKPHOS  --  69 83  --  75  BILITOT  --  0.5 0.6  --  0.4  PROT  --  4.3* 4.7*  --  4.8*  ALBUMIN 1.5* 1.6* 1.7* 1.5* 1.6*   No results for input(s): "LIPASE", "AMYLASE" in the last 168 hours. No results for input(s): "AMMONIA" in the last 168 hours. Coagulation Profile: No results for input(s): "INR", "PROTIME" in the last 168 hours. Cardiac Enzymes: No results for input(s): "CKTOTAL", "CKMB", "CKMBINDEX", "TROPONINI" in the last 168 hours. BNP (last 3 results) Recent Labs    07/11/22 1451  PROBNP 7.0   HbA1C: No results for input(s): "HGBA1C" in the last 72 hours. CBG: Recent Labs  Lab 01/15/23 1558 01/15/23 2022 01/16/23 0013 01/16/23 0348 01/16/23 0741  GLUCAP 125* 125* 142* 129* 131*   Lipid Profile: Recent Labs    01/15/23 0520  TRIG 100   Thyroid Function Tests: No results for input(s): "TSH", "T4TOTAL", "FREET4", "T3FREE", "THYROIDAB" in the last 72 hours. Anemia Panel: No results for input(s): "VITAMINB12", "FOLATE", "FERRITIN", "TIBC", "IRON", "RETICCTPCT" in the last 72 hours. Sepsis Labs: Recent Labs  Lab 01/09/23 1527 01/09/23 1731  LATICACIDVEN 1.0 1.2    No results found for this or any previous visit (from the past 240  hour(s)).       Radiology Studies: No results found.      Scheduled Meds:  (feeding supplement) PROSource Plus  30 mL Oral BID BM   sodium chloride   Intravenous Once   amiodarone  200 mg Oral Daily   Chlorhexidine Gluconate Cloth  6 each Topical Q0600   feeding supplement  1 Container Oral TID BM   hydrALAZINE  25 mg Oral TID   pantoprazole (PROTONIX) IV  40 mg Intravenous Q12H   sodium chloride flush  3 mL Intravenous Q12H   Continuous Infusions:  albumin human Stopped (01/01/23 1020)   anticoagulant sodium citrate      ceFAZolin (ANCEF) IV     TPN ADULT (ION) Stopped (01/16/23 0650)   TPN ADULT (ION)       LOS: 27 days    Time spent: 45 minutes spent on chart review, discussion with nursing staff, consultants, updating family and interview/physical exam; more than 50% of that time was spent in counseling and/or coordination of care.    Joseph Art, DO [facility] Available via Epic secure chat 7am-7pm After these hours, please refer to coverage provider listed on [email or web address] 01/16/2023, 11:14 AM

## 2023-01-16 NOTE — Procedures (Signed)
Interventional Radiology Procedure Note  Procedure: Temp to tunneled HD catheter conversion  Indication: Renal failure  Findings:  Please refer to procedural dictation for full description. Catheter tip in superior right atrium. Catheter is ready for use.  Complications: None  EBL: < 10 mL  Acquanetta Belling, MD [phone]

## 2023-01-17 DIAGNOSIS — R7989 Other specified abnormal findings of blood chemistry: Secondary | ICD-10-CM | POA: Diagnosis not present

## 2023-01-17 DIAGNOSIS — K551 Chronic vascular disorders of intestine: Secondary | ICD-10-CM | POA: Diagnosis not present

## 2023-01-17 DIAGNOSIS — K559 Vascular disorder of intestine, unspecified: Secondary | ICD-10-CM | POA: Diagnosis not present

## 2023-01-17 LAB — GLUCOSE, CAPILLARY
Glucose-Capillary: 130 mg/dL — ABNORMAL HIGH (ref 70–99)
Glucose-Capillary: 124 mg/dL — ABNORMAL HIGH (ref 70–99)
Glucose-Capillary: 110 mg/dL — ABNORMAL HIGH (ref 70–99)
Glucose-Capillary: 120 mg/dL — ABNORMAL HIGH (ref 70–99)

## 2023-01-17 LAB — CBC
HCT: 22.9 % — ABNORMAL LOW (ref 39.0–52.0)
Hemoglobin: 7.4 g/dL — ABNORMAL LOW (ref 13.0–17.0)
MCH: 30.1 pg (ref 26.0–34.0)
MCHC: 32.3 g/dL (ref 30.0–36.0)
MCV: 93.1 fL (ref 80.0–100.0)
Platelets: 160 10*3/uL (ref 150–400)
RBC: 2.46 MIL/uL — ABNORMAL LOW (ref 4.22–5.81)
RDW: 15.4 % (ref 11.5–15.5)
WBC: 4.5 10*3/uL (ref 4.0–10.5)
nRBC: 0 % (ref 0.0–0.2)

## 2023-01-17 MED ORDER — HYDRALAZINE HCL 50 MG PO TABS
50.0000 mg | ORAL_TABLET | Freq: Three times a day (TID) | ORAL | Status: DC
  Administered 2023-01-17 – 2023-01-24 (×20): 50 mg via ORAL
  Filled 2023-01-17 (×21): qty 1

## 2023-01-17 MED ORDER — POTASSIUM CHLORIDE CRYS ER 20 MEQ PO TBCR
40.0000 meq | EXTENDED_RELEASE_TABLET | Freq: Every day | ORAL | Status: AC
  Administered 2023-01-18: 40 meq via ORAL
  Filled 2023-01-17: qty 2

## 2023-01-17 MED ORDER — CLINIMIX/DEXTROSE (8/10) 8 % IV SOLN
INTRAVENOUS | Status: AC
  Filled 2023-01-17: qty 2000

## 2023-01-17 MED ORDER — ADULT MULTIVITAMIN W/MINERALS CH: 1.0000 | ORAL_TABLET | Freq: Every day | ORAL | Status: DC

## 2023-01-17 MED ORDER — RENA-VITE PO TABS
1.0000 | ORAL_TABLET | Freq: Every day | ORAL | Status: DC
  Administered 2023-01-17 – 2023-01-23 (×7): 1 via ORAL
  Filled 2023-01-17 (×7): qty 1

## 2023-01-17 MED ORDER — MAGNESIUM SULFATE IN D5W 1-5 GM/100ML-% IV SOLN
1.0000 g | Freq: Once | INTRAVENOUS | Status: AC
  Administered 2023-01-18: 1 g via INTRAVENOUS
  Filled 2023-01-17: qty 100

## 2023-01-17 MED ORDER — FAT EMUL FISH OIL/PLANT BASED 20% (SMOFLIPID)IV EMUL
250.0000 mL | INTRAVENOUS | Status: AC
  Administered 2023-01-17: 250 mL via INTRAVENOUS
  Filled 2023-01-17 (×2): qty 250

## 2023-01-17 NOTE — Progress Notes (Signed)
Inpatient Rehab Admissions Coordinator:   CIR following, but currently Pt. Is not medically stable for CIR and is not yet participating with therapies at a level to tolerate the intensity. Will continue to follow for medical readiness and progress and participation with therapies.   Megan Salon, MS, CCC-SLP Rehab Admissions Coordinator  [phone] (celll) [phone] (office)

## 2023-01-17 NOTE — Progress Notes (Signed)
Physical Therapy Treatment Patient Details Name: [name] [name] MRN: [id number] DOB: October 29, 1955 Today's Date: 01/17/2023   History of Present Illness Pt is a 67 y/o male admitted 9/11 with several weeks of abdominal pain along with a 30 pound weight loss. Work up included mesenteric ischemia and necrotic area of jejunum. S/p Exploratory laparotomy and small bowel resection 9/12, reopening of the laparotomy 9/14, second re-opening of laparotomy and more small bowel resection with anastomosis x2 9/16. 9/18 iHD, Extubated 9/22; 9/26 trans out of ICU; 9/29 rectal bleeding began 9/30-10/1 hypotensive and return to ICU with ongoing bleeding; 10/2 endoscopy and colonoscopy deep ulcerations encompassing 80% on the observed bowel with stigmata of recent bleeding. PMHx:testicular CA, HTN, ankle and patellar fx's, retinal detachment    PT Comments  Pt in bed upon arrival with wife present. Pt agreeable to PT session with encouragement. Pt continues to be hesistant with movement, requiring frequent rest breaks and time to mentally prepare before tasks. Performed AROM and PROM to B LE before movement. Pt was able move supine/sit with MinA and another therapist close by for pt comfort and confidence. Pt was educated on the use of the Stedy and importance of progressing movement. Pt verbalized understanding, however, after sitting EOB for 5-8 minutes EOB, pt reported fatigue and dizziness and requested to lay down. There were no significant changes to BP at that time. Acute PT to follow.    If plan is discharge home, recommend the following: A lot of help with walking and/or transfers;A lot of help with bathing/dressing/bathroom;Assistance with cooking/housework;Assist for transportation           Precautions / Restrictions Precautions Precautions: Fall Precaution Comments: anxious Restrictions Weight Bearing Restrictions: No     Mobility  Bed Mobility Overal bed mobility: Needs Assistance Bed Mobility:  Supine to Sit, Sit to Supine, Rolling Rolling: Contact guard assist (x2 B)   Supine to sit: Min assist, HOB elevated, Used rails (+2 for pt confidence and comfort) Sit to supine: Contact guard assist, HOB elevated   General bed mobility comments: MinA for sup/sit with +2 for pt confidence and comfort with mobility, assistance with completing LE to EOB and trunk elevation. Increased time for pt to prepare for movement.    Transfers    General transfer comment: pt declined transfer          Balance Overall balance assessment: Needs assistance Sitting-balance support: Feet supported, No upper extremity supported Sitting balance-Leahy Scale: Fair Sitting balance - Comments: able to sit CGA with no UE supported         Cognition Arousal: Alert Behavior During Therapy: Flat affect Overall Cognitive Status: Within Functional Limits for tasks assessed      General Comments: hesitant to move at times        Exercises Other Exercises Other Exercises: AROM hip flexion stretch in supine, x45 s B Other Exercises: PROM supine hamstring stretch, x45 s B    General Comments General comments (skin integrity, edema, etc.): VSS on RA, pt reported dizziness with no significant changes in BP. Able to sit EOB 5-8 minutes with CGA      Pertinent Vitals/Pain Pain Assessment Pain Assessment: No/denies pain     PT Goals (current goals can now be found in the care plan section) Progress towards PT goals: Progressing toward goals    Frequency    Min 1X/week           Co-evaluation PT/OT/SLP Co-Evaluation/Treatment: Yes Reason for Co-Treatment: Necessary to address cognition/behavior during  functional activity PT goals addressed during session: Mobility/safety with mobility        AM-PAC PT "6 Clicks" Mobility   Outcome Measure  Help needed turning from your back to your side while in a flat bed without using bedrails?: A Little Help needed moving from lying on your back  to sitting on the side of a flat bed without using bedrails?: A Lot Help needed moving to and from a bed to a chair (including a wheelchair)?: Total Help needed standing up from a chair using your arms (e.g., wheelchair or bedside chair)?: Total Help needed to walk in hospital room?: Total Help needed climbing 3-5 steps with a railing? : Total 6 Click Score: 9    End of Session   Activity Tolerance: Other (comment) (Pt limited by reports of lightheadedness and fatigue) Patient left: in bed;with call bell/phone within reach;with bed alarm set Nurse Communication: Mobility status PT Visit Diagnosis: Other abnormalities of gait and mobility (R26.89);Muscle weakness (generalized) (M62.81)     Time: 1610-9604 PT Time Calculation (min) (ACUTE ONLY): 37 min  Charges:    $Therapeutic Activity: 8-22 mins PT General Charges $$ ACUTE PT VISIT: 1 Visit                     Hilton Cork, PT, DPT Secure Chat Preferred  Rehab Office [phone]    Arturo Morton Brion Aliment 01/17/2023, 11:25 AM

## 2023-01-17 NOTE — Progress Notes (Signed)
Patient refused boost breeze, but agreeable to drink chocolate ensure mixed with chocolate magic cup. Consumed approximately half of each item.

## 2023-01-17 NOTE — Progress Notes (Signed)
[facility] Progress Note   Assessment/ Plan:   Acute kidney injury on CKD IIIb: Baseline creatinine level around 2.0.  Acute kidney injury likely ischemic ATN due to ex lap surgery/IV contrast/hypotension  Started on HD 9/18 due to inability to wean from vent and worsening renal function.  He was then switched to CRRT on 9/20-24. Course complicated by GI bleed.  Started on HD on 10/5 after nontunneled catheter placement with IR on 10/4 Reconsulted IR for a tunneled catheter- placed 10/8, appreciate assistance HD rolled over to today d/t high census on MWF- will keep TTS for now, next HD tomorrow If he can tolerate sitting in a chair in his room he would like to try sitting in a chair for HD as early as this Wednesday, 10/9 Agree with not intervening on right RAS given that his right kidney is already much smaller than anticipated, discussed with VVS at that time. Likely not much function being contributed from right.   Strict ins and outs and daily weights Note that if contrast is clinically indicated urgently or emergently would then proceed with contrast  Acute mesenteric ischemia: Status post small bowel resection and superior mesenteric artery stent placement.  Followed by vascular surgery.  S/p OR again 9/16 for exlap and further small bowel resection, abd closed Metabolic acidosis - resolved Vent -dependent respiratory failure: Extubated and on room air and monitoring volume and resp status closely given large amount of inputs required.  Anemia due to acute blood loss: transfuse prn for hgb <7.  PRBC's per primary team. Multiple transfusions on 10/1.  S/p EGD and colonoscopy on 10/2. Getting PRBC's on 10/6.  Acute GI bleed - s/p PRBC's and GI is following.  S/p EGD and colonoscopy as above on 10/2 with severe mucosal ulceration in the rectum and rectosigmoid colon (felt ischemic or infectious per GI) Hypertension - shock resolved.  Avoid hypotension    Severe protein  malnutrition - nutrition per primary team Transaminitis - may have been setting of shock - improving   Subjective:    Seen on dialysis.  TDC earlier today.  Did well   Objective:   BP (!) 168/80 (BP Location: Left Arm)   Pulse 81   Temp 98.2 F (36.8 C) (Oral)   Resp 20   Ht 5\' 11"  (1.803 m)   Wt 99.8 kg   SpO2 96%   BMI 30.68 kg/m   Physical Exam: Gen:NAD, lying flat in bed CVS: RRR Resp: clear Abd: soft Ext: no LE edema ACCESS: tunneled internal jugular cathter R side   Labs: BMET Recent Labs  Lab 01/11/23 0401 01/12/23 0400 01/13/23 0400 01/14/23 0403 01/15/23 0520  NA 138 135 138 137 140  K 3.7 4.5 4.8 3.9 4.1  CL 111 105 104 100 99  CO2 18* 17* 18* 25 28  GLUCOSE 134* 125* 122* 127* 138*  BUN 130* 147* 160* 110* 131*  CREATININE 2.95* 3.51* 4.05* 3.17* 4.13*  CALCIUM 7.6* 8.0* 8.3* 7.7* 8.1*  PHOS 4.2  --   --  4.3 4.5   CBC Recent Labs  Lab 01/13/23 0400 01/14/23 0403 01/14/23 1620 01/15/23 0520 01/17/23 0607  WBC 7.9 6.3  --  5.9 4.5  HGB 7.6* 6.9* 7.5* 8.1* 7.4*  HCT 22.7* 20.9* 22.7* 24.1* 22.9*  MCV 92.3 88.2  --  90.9 93.1  PLT 183 168  --  172 160      Medications:     (feeding supplement) PROSource Plus  30 mL Oral  BID BM   amiodarone  200 mg Oral Daily   Chlorhexidine Gluconate Cloth  6 each Topical Q0600   feeding supplement  1 Container Oral TID BM   hydrALAZINE  25 mg Oral TID   multivitamin  1 tablet Oral QHS   pantoprazole (PROTONIX) IV  40 mg Intravenous Q12H   [START ON 01/18/2023] potassium chloride  40 mEq Oral Daily   sodium chloride flush  3 mL Intravenous Q12H     Bufford Buttner, MD 01/17/2023, 1:08 PM

## 2023-01-17 NOTE — Progress Notes (Addendum)
PHARMACY - TOTAL PARENTERAL NUTRITION CONSULT NOTE  Indication:  SMA syndrome w associated weight loss  Patient Measurements: Height: 5\' 11"  (180.3 cm) Weight: 99.8 kg (220 lb) IBW/kg (Calculated) : 75.3 TPN AdjBW (KG): 80.4 Body mass index is 30.68 kg/m. Usual Weight: 95 kg, has lost 50 lbs recently   Assessment:  67 yo M admitted with abdominal pain, weight loss, and poor PO intake. 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 He is now s/p SMA stent placement. He was initially left is discontinuity with open abdomen and was closed on 9/16. TPN was initiated 9/14.   Pt evaluated by speech therapy 9/29 - was able to tolerate trials of ice chips, thin liquid, and puree w/o overt signs or aspiration or difficulty swallowing.  MBS completed 9/30 - cognitive based oral dysphagia; recommended for trial of full liquids and upgrade as mentation allows. Requires full assistance for feeding until mentation improves.   10/9 switching to Clinimix 8/10 due to IVF shortage caused by [facility] per hospital policy. Will not be able to meet full nutrition goals due to limited stock and fluid restrictions. Will replace electrolytes and give lipids outside of Clinimix.   Glucose / Insulin: A1c 6.3% - CBGs < 140; off insulin Electrolytes:last labs 10/7  CO2 28 (max acetate in TPN), CoCa 10.02,  Others wnl   Renal: off CRRT 9/24, iHD initiated, last HD 10/8. BUN 131  Hepatic: alk phos/AST/tbili wnl, ALT down 56, albumin 1.6, TG 100 Intake / Output; MIVF: UOP 0.5 mL/kg/hr, LBM 10/7 per charting (rectal bleed resolved). HD removed . GI Imaging:  9/20 CTA: improved ostial stenosis post stenting, thrombus/occlusion of SMA/ileocolic artery improving, surgical site has increased dilatation, L lung nodule GI Surgeries / Procedures:  9/14 re-ex lap, left in discontinuity with plans for re-exploration  9/16 re-ex lap, SBR with anastomosis x2,  abdominal closure 10/2 EGD: gastric erythematous mucosa, no active bleeding  10/2 Colo: Deep and severe mucosal ulceration in the rectum and in the recto-sigmoid colon, likely ischemic or infectious, not actively bleeding; Exam aborted in the descending colon 10/8 Tentative conversion to tunneled catheter   Central access: CVC [facility] 12/23/22 TPN start date: 12/23/22  Nutritional Goals: Goal concentrated TPN rate is 80 mL/hr (provides 140 g AA and 2400 kCal per day) Goal un-concentrated TPN is 100 mL/hr (provides 140g AA and 2253 kCal per day)  RD Estimated Needs Total Energy Estimated Needs: 2300-2500 kcals Total Protein Estimated Needs: 140 -150 g Total Fluid Estimated Needs: >/= 2.3 L  Current Nutrition:  TPN 10/2 CLD  10/3: (starting calorie count) regular diet, tolerating but not eating much 10/4-10/6: calorie count completed by RD, eating about 10-20% of calorie goals per RD note. Continue TPN. 10/7-10/8: NPO for procedure, calorie count ongoing  Plan:  Switch to Clinimix 8/10 due to IVF shortage. Will alternate: - per day (53ml/hr) on MWFSun  - per day (42 ml/hr) on TTSat - Smof lipid daily To provide an average of 126 g AA and 1492 Kcal per day. Unable to meet full nutrition requirements due to Clinimix and volume limitations. Electrolytes: Give Kcl 40 mEq PO and Mag 1g IV daily, no phosphorus or calcium. Note Clinimix 8/10 does not have Na and Cl:Acet is set at 2:1. Unable to use Clinimix 8/10 E due to excessive K and Phos for this patient.  Give PO renal MVI  Monitor TPN labs daily while on  Clinimix F/u PO intake/calorie count and wean TPN as able   Thank you for allowing pharmacy to be a part of this patient's care.  Alphia Moh, PharmD, BCPS, BCCP Clinical Pharmacist  Please check AMION for all [facility] Pharmacy phone numbers After 10:00 PM, call Main Pharmacy [phone]

## 2023-01-17 NOTE — Progress Notes (Addendum)
[facility] PROGRESS NOTE    Progress Note  [name] [name]  QIO:[id number] DOB: 1955-06-03 DOA: 12/20/2022 PCP: [name], MD     Brief Narrative:   [name] [name] is an 67 y.o. 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  Abdominal pain continued reported weight loss and poor oral intake.  Admitted on 9/11 with concerns of superior mesenteric artery thrombosis was started on IV heparin infusion vascular surgery was consulted, CT angiogram of abdomen and pelvis was done on 12/21/2022 that showed acute occlusion of the distal SMA as well as the inferior mesenteric artery aching to the OR emergently and was found to have a necrotic jejunum status post resection.  SMA stent was placed, patient left with an open wound and remained on mechanical ventilation postoperatively.  Had to be taken back to the OR several times as there was a concern of bowel integrity 12/25/2022 there were perforations so further surgical resection was done.  She was also changed to argatroban post op.  Had to be dialyzed started on 12/27/2022, subsequently extubated on 12/31/2022.  Argatroban was held on 01/07/2023 due to rectal bleeding, transfused 2 units of packed red blood cells due to drop in hemoglobin of 6.1.,  Transferred to [facility] on 01/09/2023.  Transferred back to the ICU on 01/09/2023 due to rectal bleeding anticoagulation was held. GI was consulted on 01/10/2019 for endoscopy showed no signs of bleeding and colonoscopy deep ulceration encompassing 80% of the bowel observed with stigmata of recent bleeding. Started back on HD on 01/14/2023. The plan is to transfer the patient to CIR.   Significant Events: 9/11 admitted with abdominal pain 9/12 CT  demonstrating mesenteric occlusion, bowel resection and stent placed, to ICU on vent. Left in discontinuity  9/14 back to OR. Left in discontinuity as still concern about bowel integrity 9/16 back to OR  s sig areas of ischemia w/ 2 areas of perforation, another 66cm section of jejunum was resected and area of discontinuity was reanastomosed. Changed to argatroban post-op as unable to get heparin levels detectable  9/17 dropping RASS goal to 0 to -1, assessing for readiness to wean, serum creatinine and BUN still climbing some but making urine 9/18 iHD  9/20 rising pressor need, anemia: CTA abd: nothing emergent, transfused 9/22 extubated 9/25 poor tolerance of NGT clamping. Abx ended  9/26 trying clamping again. Adding PRN antihypertensives. Temp incr a bit, 100.6. holding off on HD and tunneled HD cath given incr UOP  9/26 transferred out of ICU 9/29 Argatroban stopped and held due to rectal bleeding 9/30 Hgb 6.1 at 1250, 2u PRBC ordered then had large bloody BM again overnight, repeat H/H pending. Was hypotensive initially to 70-80s but responded to 200cc IVF. Likely needs more blood, TRH to follow.  9/30 PCCM asked to see incase has ongoing bleeding and hypotension and needs ICU care again 10/1 ICU transfer for GI bleed. AC held.  10/2 endoscopy > no bleedin source identified 10/2 colonoscopy > deep ulcerations encompassing 80% on the observed bowel with stigmata of recent bleeding. 10/6-- HD restarted Eventual plan to CIR if able   Consultants:  Vascular GI Nephrology PCCM  Assessment/Plan:   Acute Superior mesenteric artery stenosis [facility]) Status post thrombectomy and SMA stent placement, with exploratory laparotomy and bowel resection on 12/21/2022 followed  by exploratory laparotomy x 2 on 12/23/2022 and 12/25/2022. Completed course of IV vancomycin and meropenem on 01/03/2023. Per vascular surgery the majority of his bowel surviving after a 3 mm stent. Further management per  vascular surgery. Continue daily wet-to-dry changes to the midline abdominal wound per general surgery. AC anticoagulation on hold, given ongoing anemic issues and risk of rebleeding outweigh the benefits in this specific circumstance, vascular surgery agrees to hold on anticoagulation indefinitely. Patient has a poor prognosis  Postoperative GI bleed/acute blood loss anemia: Colonoscopy in 10-2- 24 showed multiple ulcerations with evidence of bleeding she was transfused 2 units of packed red blood cells last 1 on 01/14/2023. Anticoagulation has been discontinued. Hemoglobin this morning 7.4.  Paroxysmal A-fib with RVR: Initially started on amiodarone drip now changed to oral. Not a candidate for anticoagulation due to high risk of rebleeding. Now in sinus rhythm.  Essential hypertension/hyperlipidemia: Holding medication, blood pressures trending up.  ATN on chronic kidney disease stage III/[place] acidosis: Likely ischemic due to exploratory laparotomy, IV contrast and hypotension. Nephrology was consulted started on HD on 12/27/2022, eventually switched to CRRT on 12/29/2018 to 01/02/2023. Course was complicated by GI bleed, and restarted on HD on 01/13/2023 after nontunneled catheter placed by IR on 01/12/2023. IR has been consulted this post tunneled catheter on 01/16/2023. Last HD on 01/16/2023. Continue strict I's and O's. Further management per nephrology.  For HD Tuesday Thursday and Saturdays. Renal hoping that he could do dialysis sitting down.  Physical deconditioning: PT OT evaluated the patient will need inpatient rehab. Out of bed to chair narcotics for pain control. CIR was consulted they think patient would not tolerate 3 hours of daily physical therapy however he progressed they would like to be reconsulted, not a candidate for inpatient rehab at this point in time.  Nutrition: Tolerating diet since 01/11/2023. Still on TPN, further management per general surgery.  Morbid  obesity: Noted.   Stage III 3 sacral decubitus ulcer: RN Pressure Injury Documentation: Pressure Injury 01/04/23 Buttocks Right;Upper Stage 3 -  Full thickness tissue loss. Subcutaneous fat may be visible but bone, tendon or muscle are NOT exposed. (Active)  01/04/23 2100  Location: Buttocks  Location Orientation: Right;Upper  Staging: Stage 3 -  Full thickness tissue loss. Subcutaneous fat may be visible but bone, tendon or muscle are NOT exposed.  Wound Description (Comments):   Present on Admission: No  Dressing Type Foam - Lift dressing to assess site every shift 01/16/23 2140    DVT prophylaxis: SCD Family Communication:wife Status is: Inpatient Remains inpatient appropriate because: Acute Mesenteric ischemia    Code Status:     Code Status Orders  (From admission, onward)           Start     Ordered   12/20/22 1618  Full code  Continuous       Question:  By:  Answer:  Consent: discussion documented in EHR   12/20/22 1619           Code Status History     Date Active Date Inactive Code Status Order ID Comments User Context   04/01/2021 0503 04/06/2021 0246 Full Code [id number]  Eduard Clos, MD ED   06/16/2014 1636 06/17/2014 1017 Full Code [id number]  Sherrie George, MD Inpatient         IV Access:   Peripheral IV   Procedures and diagnostic studies:   IR Fluoro Guide CV Line Right  Result Date: 01/16/2023 INDICATION: Renal failure EXAM: Temporary  to tunneled hemodialysis catheter conversion MEDICATIONS: Ancef 2 g IV; The antibiotic was administered within an appropriate time interval prior to skin puncture. ANESTHESIA/SEDATION: Moderate (conscious) sedation was employed during this procedure. A total of Versed 1 mg and Fentanyl 50 mcg was administered intravenously by the radiology nurse. Total intra-service moderate Sedation Time: 10 minutes. The patient's level of consciousness and vital signs were monitored continuously by radiology nursing  throughout the procedure under my direct supervision. FLUOROSCOPY: Radiation Exposure Index (as provided by the fluoroscopic device): 25 mGy Kerma COMPLICATIONS: None immediate. PROCEDURE: Informed written consent was obtained from the patient after a thorough discussion of the procedural risks, benefits and alternatives. All questions were addressed. Maximal Sterile Barrier Technique was utilized including caps, mask, sterile gowns, sterile gloves, sterile drape, hand hygiene and skin antiseptic. A timeout was performed prior to the initiation of the procedure. Patient positioned supine on the angiography table. External segment of the temporary right IJ hemodialysis catheter, neck and anterior upper chest prepped and draped in the usual sterile fashion. Following local lidocaine administration, the right IJ non-tunneled temporary dialysis catheter was removed over a guidewire. Seriel dilation was performed and peel-away sheath was placed. Attention then turned to the anterior upper chest. Following local lidocaine administration, the hemodialysis catheter was tunneled from the chest wall to the venotomy site. The catheter was inserted through the peel-away sheath. The tip of the catheter was positioned within the right atrium using fluoroscopic guidance. All lumens of the catheter aspirated and flushed well. The dialysis lumens were locked with Heparin. The catheter was secured to the skin with suture. The insertion site was covered with sterile dressing. IMPRESSION: Tunneled 19 cm right IJ hemodialysis catheter is ready for use. Electronically Signed   By: Acquanetta Belling M.D.   On: 01/16/2023 14:24     Medical Consultants:   None.   Subjective:    JAEDIN VANDERSCHAAF relates he is tolerating his diet denies any abdominal pain or blood per rectum  Objective:    Vitals:   01/16/23 1500 01/16/23 1504 01/16/23 1959 01/17/23 0345  BP:   (!) 157/73 (!) 158/74  Pulse: 80  81 83  Resp: 16  14 17   Temp:     98.2 F (36.8 C)  TempSrc:    Oral  SpO2: 95%  96% 97%  Weight:  101.2 kg  99.8 kg  Height:       SpO2: 97 % O2 Flow Rate (L/min): 1 L/min FiO2 (%): 40 %   Intake/Output Summary (Last 24 hours) at 01/17/2023 0809 Last data filed at 01/17/2023 0353 Gross per 24 hour  Intake 1870.06 ml  Output 2620 ml  Net -749.94 ml   Filed Weights   01/15/23 0440 01/16/23 1504 01/17/23 0345  Weight: 100.5 kg 101.2 kg 99.8 kg    Exam: General exam: In no acute distress. Respiratory system: Good air movement and clear to auscultation. Cardiovascular system: S1 & S2 heard, RRR. No JVD. Gastrointestinal system: Abdomen is nondistended, soft and nontender.  Extremities: No pedal edema. Skin: No rashes, lesions or ulcers Psychiatry: Judgement and insight appear normal. Mood & affect appropriate.    Data Reviewed:    Labs: Basic Metabolic Panel: Recent Labs  Lab 01/11/23 0401 01/12/23 0400 01/13/23 0400 01/14/23 0403 01/15/23 0520  NA 138 135 138 137 140  K 3.7 4.5 4.8 3.9 4.1  CL 111 105 104 100 99  CO2 18* 17* 18* 25 28  GLUCOSE 134* 125* 122* 127* 138*  BUN  130* 147* 160* 110* 131*  CREATININE 2.95* 3.51* 4.05* 3.17* 4.13*  CALCIUM 7.6* 8.0* 8.3* 7.7* 8.1*  MG 2.2 2.3  --  2.0 2.2  PHOS 4.2  --   --  4.3 4.5   GFR Estimated Creatinine Clearance: 20.6 mL/min (A) (by C-G formula based on SCr of 4.13 mg/dL (H)). Liver Function Tests: Recent Labs  Lab 01/11/23 0401 01/13/23 0400 01/14/23 0403 01/15/23 0520  AST 50* 35  --  24  ALT 96* 81*  --  56*  ALKPHOS 69 83  --  75  BILITOT 0.5 0.6  --  0.4  PROT 4.3* 4.7*  --  4.8*  ALBUMIN 1.6* 1.7* 1.5* 1.6*   No results for input(s): "LIPASE", "AMYLASE" in the last 168 hours. No results for input(s): "AMMONIA" in the last 168 hours. Coagulation profile No results for input(s): "INR", "PROTIME" in the last 168 hours. COVID-19 Labs  No results for input(s): "DDIMER", "FERRITIN", "LDH", "CRP" in the last 72 hours.  Lab  Results  Component Value Date   SARSCOV2NAA NEGATIVE 05/03/2021   SARSCOV2NAA NEGATIVE 04/05/2021   SARSCOV2NAA NEGATIVE 04/01/2021    CBC: Recent Labs  Lab 01/12/23 1755 01/13/23 0400 01/14/23 0403 01/14/23 1620 01/15/23 0520 01/17/23 0607  WBC 8.5 7.9 6.3  --  5.9 4.5  HGB 7.3* 7.6* 6.9* 7.5* 8.1* 7.4*  HCT 22.4* 22.7* 20.9* 22.7* 24.1* 22.9*  MCV 92.2 92.3 88.2  --  90.9 93.1  PLT 190 183 168  --  172 160   Cardiac Enzymes: No results for input(s): "CKTOTAL", "CKMB", "CKMBINDEX", "TROPONINI" in the last 168 hours. BNP (last 3 results) Recent Labs    07/11/22 1451  PROBNP 7.0   CBG: Recent Labs  Lab 01/15/23 2022 01/16/23 0013 01/16/23 0348 01/16/23 0741 01/16/23 1550  GLUCAP 125* 142* 129* 131* 137*   D-Dimer: No results for input(s): "DDIMER" in the last 72 hours. Hgb A1c: No results for input(s): "HGBA1C" in the last 72 hours. Lipid Profile: Recent Labs    01/15/23 0520  TRIG 100   Thyroid function studies: No results for input(s): "TSH", "T4TOTAL", "T3FREE", "THYROIDAB" in the last 72 hours.  Invalid input(s): "FREET3" Anemia work up: No results for input(s): "VITAMINB12", "FOLATE", "FERRITIN", "TIBC", "IRON", "RETICCTPCT" in the last 72 hours. Sepsis Labs: Recent Labs  Lab 01/13/23 0400 01/14/23 0403 01/15/23 0520 01/17/23 0607  WBC 7.9 6.3 5.9 4.5   Microbiology No results found for this or any previous visit (from the past 240 hour(s)).   Medications:    (feeding supplement) PROSource Plus  30 mL Oral BID BM   sodium chloride   Intravenous Once   amiodarone  200 mg Oral Daily   Chlorhexidine Gluconate Cloth  6 each Topical Q0600   feeding supplement  1 Container Oral TID BM   hydrALAZINE  25 mg Oral TID   pantoprazole (PROTONIX) IV  40 mg Intravenous Q12H   sodium chloride flush  3 mL Intravenous Q12H   Continuous Infusions:  albumin human Stopped (01/01/23 1020)    ceFAZolin (ANCEF) IV     TPN ADULT (ION) 80 mL/hr at  01/17/23 0353      LOS: 28 days   Marinda Elk  [facility]  01/17/2023, 8:09 AM

## 2023-01-17 NOTE — Progress Notes (Signed)
Patient ID: [name] [name], male   DOB: 04/10/1956, 67 y.o.   MRN: [id number] [facility] Surgery Progress Note  7 Days Post-Op  Subjective: Getting up with PT currently.  Has made it to the EOB, attempting to get to the chair if he is able.  States he is eating better, but RN reports only 25% of lunch/dinner meal yesterday.  Not drinking protein shakes.    Some dark and bright red blood in stool yesterday afternoon  Objective: Vital signs in last 24 hours: Temp:  [98.2 F (36.8 C)] 98.2 F (36.8 C) (10/09 0821) Pulse Rate:  [64-83] 81 (10/09 0821) Resp:  [14-18] 15 (10/09 0821) BP: (137-165)/(68-87) 165/68 (10/09 0821) SpO2:  [93 %-100 %] 97 % (10/09 0821) Weight:  [99.8 kg-101.2 kg] 99.8 kg (10/09 0345) Last BM Date : 01/16/23  Intake/Output from previous day: 10/08 0701 - 10/09 0700 In: 1870.1 [P.O.:240; I.V.:1630.1] Out: 2620 [Urine:1120] Intake/Output this shift: No intake/output data recorded.  PE: Gen: alert, NAD Pulm: respirations unlabored  Abd: soft, nondistended, nontender, dressing remained in place today as he was getting up.   Lab Results:  Recent Labs    01/15/23 0520 01/17/23 0607  WBC 5.9 4.5  HGB 8.1* 7.4*  HCT 24.1* 22.9*  PLT 172 160   BMET Recent Labs    01/15/23 0520  NA 140  K 4.1  CL 99  CO2 28  GLUCOSE 138*  BUN 131*  CREATININE 4.13*  CALCIUM 8.1*   PT/INR No results for input(s): "LABPROT", "INR" in the last 72 hours. CMP     Component Value Date/Time   NA 140 01/15/2023 0520   K 4.1 01/15/2023 0520   CL 99 01/15/2023 0520   CO2 28 01/15/2023 0520   GLUCOSE 138 (H) 01/15/2023 0520   BUN 131 (H) 01/15/2023 0520   CREATININE 4.13 (H) 01/15/2023 0520   CALCIUM 8.1 (L) 01/15/2023 0520   PROT 4.8 (L) 01/15/2023 0520   PROT 6.3 03/16/2020 0800   ALBUMIN 1.6 (L) 01/15/2023 0520   ALBUMIN 4.3 03/16/2020 0800   AST 24 01/15/2023 0520   ALT 56 (H) 01/15/2023 0520   ALKPHOS 75 01/15/2023 0520   BILITOT 0.4 01/15/2023 0520    BILITOT 0.8 03/16/2020 0800   GFRNONAA 15 (L) 01/15/2023 0520   GFRAA >60 04/14/2015 1127   Lipase     Component Value Date/Time   LIPASE 28 11/27/2022 0755       Studies/Results: IR Fluoro Guide CV Line Right  Result Date: 01/16/2023 INDICATION: Renal failure EXAM: Temporary to tunneled hemodialysis catheter conversion MEDICATIONS: Ancef 2 g IV; The antibiotic was administered within an appropriate time interval prior to skin puncture. ANESTHESIA/SEDATION: Moderate (conscious) sedation was employed during this procedure. A total of Versed 1 mg and Fentanyl 50 mcg was administered intravenously by the radiology nurse. Total intra-service moderate Sedation Time: 10 minutes. The patient's level of consciousness and vital signs were monitored continuously by radiology nursing throughout the procedure under my direct supervision. FLUOROSCOPY: Radiation Exposure Index (as provided by the fluoroscopic device): 25 mGy Kerma COMPLICATIONS: None immediate. PROCEDURE: Informed written consent was obtained from the patient after a thorough discussion of the procedural risks, benefits and alternatives. All questions were addressed. Maximal Sterile Barrier Technique was utilized including caps, mask, sterile gowns, sterile gloves, sterile drape, hand hygiene and skin antiseptic. A timeout was performed prior to the initiation of the procedure. Patient positioned supine on the angiography table. External segment of the temporary right IJ hemodialysis  catheter, neck and anterior upper chest prepped and draped in the usual sterile fashion. Following local lidocaine administration, the right IJ non-tunneled temporary dialysis catheter was removed over a guidewire. Seriel dilation was performed and peel-away sheath was placed. Attention then turned to the anterior upper chest. Following local lidocaine administration, the hemodialysis catheter was tunneled from the chest wall to the venotomy site. The catheter was  inserted through the peel-away sheath. The tip of the catheter was positioned within the right atrium using fluoroscopic guidance. All lumens of the catheter aspirated and flushed well. The dialysis lumens were locked with Heparin. The catheter was secured to the skin with suture. The insertion site was covered with sterile dressing. IMPRESSION: Tunneled 19 cm right IJ hemodialysis catheter is ready for use. Electronically Signed   By: Acquanetta Belling M.D.   On: 01/16/2023 14:24    Anti-infectives: Anti-infectives (From admission, onward)    Start     Dose/Rate Route Frequency Ordered Stop   01/16/23 0820  ceFAZolin (ANCEF) IVPB 2g/100 mL premix        over 30 Minutes Intravenous Continuous PRN 01/16/23 0845 01/16/23 0855   01/15/23 1000  ceFAZolin (ANCEF) IVPB 2g/100 mL premix       Note to Pharmacy: Give in IR for surgical prophylaxis   2 g 200 mL/hr over 30 Minutes Intravenous  Once 01/14/23 1818     12/30/22 1800  vancomycin (VANCOCIN) IVPB 1000 mg/200 mL premix        1,000 mg 200 mL/hr over 60 Minutes Intravenous Every 24 hours 12/29/22 1844 01/03/23 1946   12/30/22 0600  vancomycin (VANCOCIN) IVPB 1000 mg/200 mL premix  Status:  Discontinued        1,000 mg 200 mL/hr over 60 Minutes Intravenous Every 12 hours 12/29/22 1842 12/29/22 1844   12/29/22 2200  meropenem (MERREM) 1 g in sodium chloride 0.9 % 100 mL IVPB  Status:  Discontinued        1 g 200 mL/hr over 30 Minutes Intravenous Every 12 hours 12/29/22 1705 12/29/22 1842   12/29/22 1930  meropenem (MERREM) 1 g in sodium chloride 0.9 % 100 mL IVPB        1 g 200 mL/hr over 30 Minutes Intravenous Every 8 hours 12/29/22 1842 01/04/23 0700   12/29/22 1800  vancomycin (VANCOREADY) IVPB 2000 mg/400 mL        2,000 mg 200 mL/hr over 120 Minutes Intravenous  Once 12/29/22 1705 12/29/22 1958   12/23/22 1230  piperacillin-tazobactam (ZOSYN) IVPB 2.25 g        2.25 g 100 mL/hr over 30 Minutes Intravenous Every 8 hours 12/23/22 1139 12/28/22  2109   12/22/22 0000  piperacillin-tazobactam (ZOSYN) IVPB 3.375 g  Status:  Discontinued        3.375 g 12.5 mL/hr over 240 Minutes Intravenous Every 8 hours 12/21/22 2331 12/23/22 1139        Assessment/Plan 67 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 Karin Lieu, exploratory laparotomy and small bowel resection Dr. Janee Morn -S/p re-exploration, replace abthera FB 12/23/22   -S/p re-exploration, small bowel resection with anastomosis x2, assessment of bowel perfusion with ICG, abdominal closure 9/16 Dr. Andrey Campanile - BRBPR s/p egd/colonoscopy 10/2 that revealed severe ulceration in the rectum and recto- sigmoid colon, no active bleeding at the time. Biopsy showed ulceration with inflammatory changes consistent with ischemia and no viral cytopathic effect, stains pending - more bleeding noted yesterday afternoon, dark mixed with  some bright red - hgb slightly down from 8.1 to 7.4.  continue to monitor.  If this bleeding persists, may need to re-engage GI to see if they can repeat c-scope and spray hemostatic spray to help with clotting.  Not many other options for this patient. -cont diet and calorie count, which remains on the door.  - he isn't taking in much and not drinking protein shakes.  Continue full strength TNA at this time. -Given ongoing anemic issues and intermittent oozing, would hold off on anticoagulation indefinitely since the risk of bleeding outweigh the benefits in the circumstance. - daily wet to dry dressing changes to midline abdominal wound  Discussed patient at length with Dr. Robb Matar on the ward.     FEN: reg, TPN VTE: all on hold indefinitely due to bleeding risk ID: No current abx    LOS: 28 days    Letha Cape, [facility] Surgery 01/17/2023, 10:13 AM Please see Amion for pager number during day hours 7:00am-4:30pm

## 2023-01-17 NOTE — Progress Notes (Signed)
Nutrition Follow-up  DOCUMENTATION CODES:   Severe malnutrition in context of acute illness/injury  INTERVENTION:  Continue TPN per pharmacy. -At this time unable to meet full nutrition requirements due to Clinimix and volume limitations.  -New TPN provision provides on average: 1537 kcal (67% minimum kcal needs), 126 grams of protein (90% minimum estimated protein needs)  Continue to measure daily weights on TPN.  Noted plan to measure daily labs while on Clinimix.  Recommend continuing calorie count to assist with weaning TPN.  Continue Boost Breeze po TID, each supplement provides 250 kcal and 9 grams of protein.  Continue PROSource Plus po BID, each supplement provides 100 kcal and 15 grams of protein.  Continue Magic cup TID with meals, each supplement provides 290 kcal and 9 grams of protein. Updated order per pt request for only berry flavor.   Encouraged pt to consume oral nutrition supplements to help meet calorie/protein needs.  Continue Rena-vite po at bedtime.   NUTRITION DIAGNOSIS:   Severe Malnutrition related to acute illness as evidenced by energy intake < or equal to 50% for > or equal to 5 days, percent weight loss.  Addressing with TPN and above interventions.  GOAL:   Patient will meet greater than or equal to 90% of their needs  Previously has been met with TPN. At this time unable to meet full nutrition requirements due to Clinimix and volume limitations.   MONITOR:   Diet advancement, Labs, Weight trends, Skin, I & O's (TPN)  REASON FOR ASSESSMENT:   Ventilator, Consult New TPN/TNA  ASSESSMENT:   67 yo male admitted with SMA stenosis/thrombosis and SB ischemia requiring SMA thrombectomy and stent followed by ex lap and SB resection. Pt with poor po intake and weight loss for several weeks PTA. 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, CKD 3, GERD, hx testicular cancer, HLD, HTN  9/11  Admitted 9/12 OR: acute on chronic mesenteric ischemia with necrotic area of jejunum requiring SB resection, bowel left in discontinuity, abdomen open with ABTherea wound VAC, SMA thrombectomy and stent 9/14 OR: reopening of recent laparotomy, evaluation of SMA with Doppler including mesentery of small bowel, placement of abthera device  9/16 OR: sig areas of ischemia with 2 areas of perforation, another 66 cm section of jejunum was resected and area of discontinuity was reanastomosed 9/18 iHD 9/19 2nd iHD 9/20 CRRT initiated, GI bleed 9/22 Extubated 9/24 CRRT stopped due to clotting issues overnight, CL diet, NG clamped; failed trial 9/26 NG clamped 9/27 NG removed 9/30 MBS with SLP, started full liquids 10/1 large bloody BM with clots, transferred to ICU, downgraded to clear liquids 10/2 colonoscopy, EGD: deep, severe mucosal ulceration in the rectum and recto-sigmoid colon (suspected ischemic or infectious) 10/3 SLP follow-up, diet advanced to regular with thin liquids 10/4 calorie count started to help with weaning TPN, nontunneled catheter placed by IR 10/5 resumed HD 10/7 discontinued Ensure Enlive per pt request as he did not like these supplements and started Boost Breeze and PROSource Plus 10/8 tunneled catheter placed by IR 10/9 TPN changed from compounded/individualized to Clinimix due to shortages  Met with pt and his wife at bedside. Pt feels he is starting to eat better. Calorie count completed today was the best pt has eaten since calorie count was started. He met 41% minimum kcal needs and 22% minimum protein needs. Encouraged intake of oral nutrition supplements to help meet needs. Pt had reported he has not yet  tried [employer] or PROSource Plus. He asked RD to discontinue Ensure Enlive on 10/7 as he reported he did not like those supplements. RN plans to make a milkshake with Ensure and ice cream for pt later today to see if he would be interested in trying. Pt denies nausea  or abdominal pain with eating.   Admission wt of 95.7 kg (211 lbs) likely reported. Pt was 98.6 kg on 9/12. Weights have fluctuated throughout admission. Most recent wt 99.8 kg on 01/17/23.  IV Access: left internal jugular CVC placed 9/14  TPN order: Changed to Clinimix 8/10 on 10/9 due to shortages (8% amino acid, 10% dextrose) 2-in-1 with lipids infused separately via Y-port Plan to alternate 2000 mL per day (82 mL/hr) on MWFSun (provides: 1320 kcal, 160 grams protein, GIR 1.39 mg/kg/min x 4 days per week) 1000 mL per day (42 mL/hr) on TTSat (provides: 660 kcal, 80 grams protein, GIR 0.7 mg/kg/min x 3 days per week) SMOF lipid 250 mL daily over 12 hours (500 kcal daily, 0.04 g/kg/hr infusion rate) On average provides: 1537 kcal (67% minimum kcal needs), 126 grams of protein (90% minimum estimated protein needs) Unable to meet full nutrition requirements due to Clinimix and volume limitations. Currently using version without electrolytes as potassium and phosphorus excessive in version with electrolytes for pt.  Medications reviewed and include: PROSource Plus 30 mL BID, amiodarone, Boost Breeze TID, Rena-vite po at bedtime, pantoprazole, potassium chloride 40 mEq daily, magnesium sulfate 1 gram once IV  Labs reviewed: CBG 124-131. On 10/7 BUN 131, Creatinine 4.13, Triglycerides 100.  UOP: 1120 mL UOP (0.5 mL/kg/hr) in previous 24 hrs  I/O: +5599.3 mL since 01/03/23  Diet Order:   Diet Order             Diet regular Fluid consistency: Thin  Diet effective now                  EDUCATION NEEDS:   Not appropriate for education at this time  Skin:  Skin Assessment: Skin Integrity Issues: Skin Integrity Issues:: Stage III DTI: n/a Stage II: n/a Stage III: R upper buttocks Wound Vac: n/a  Last BM:  10/7 - medium type 6; 10/8 - smear  Height:   Ht Readings from Last 1 Encounters:  12/20/22 5\' 11"  (1.803 m)   Weight:   Wt Readings from Last 1 Encounters:  01/17/23 99.8  kg   BMI:  Body mass index is 30.68 kg/m.  Estimated Nutritional Needs:   Kcal:  2300-2500 kcals  Protein:  140 -150 g  Fluid:  >/= 2.3 L  Audon Heymann Tollie Eth, MS, RD, LDN, CNSC Pager number available on Amion

## 2023-01-17 NOTE — Progress Notes (Signed)
Speech Language Pathology Treatment: Cognitive-Linquistic  Patient Details Name: [name] [name] MRN: [id number] DOB: [birth year] Today's Date: 01/17/2023 Time: 1330-1350 SLP Time Calculation (min) (ACUTE ONLY): 20 min  Assessment / Plan / Recommendation Clinical Impression  Pt seen for cognition with wife at bedside who both report significant improvements in cognition since admission. On initial assessment pt was not able to hold marker for clock drawing therefore he was tested out of a total of 26 points and he scored a 19/26. Today pt was reassessed with the SLUMS. His arm/hand edema has decreased and able to perform clock drawing subtest and he scored a 29/30 points. Pt is also able to retain and recall information he hears from doctors and nurses related to his care. He and wife feel his cognition has improved and agree that he is close to baseline and no longer needs ST services and will sign off at this time.     HPI HPI: Pt is a 67 yo male presenting to ED from OP vascular surgery office with known mesenteric artery stenosis s/p SMA angiogram. Admitted 9/11 with several week history of abdominal pain with 30 lb weight loss. W/u included mesenteric ischemia and necrotic area of jejunum s/p exploratory laparotomy and small bowel resection 9/12, reopening of laparotomy 9/14, second reopening and small bowel resection with anastomosis x2 9/26. Intubated 9/12-9/22. PMH includes testicular cancer, HTN, retinal detachment, GERD, HTN, HLD. EGD 10/2 revealed normal esophagus, hiatal hernia, patchy mildly erythematous mucosa without bleeding, gastroesophageal flap valve (minima fold, loose to endoscope, hiatal hernai).      SLP Plan  All goals met;Discharge SLP treatment due to (comment) (cognitive goals met)      Recommendations for follow up therapy are one component of a multi-disciplinary discharge planning process, led by the attending physician.  Recommendations may be updated based on patient  status, additional functional criteria and insurance authorization.    Recommendations                         None Cognitive communication deficit (R41.841)     All goals met;Discharge SLP treatment due to (comment) (cognitive goals met)     Royce Macadamia  01/17/2023, 2:02 PM

## 2023-01-17 NOTE — Progress Notes (Signed)
Occupational Therapy Treatment Patient Details Name: CHADWICK KOSTRZEWSKI MRN: [id number] DOB: 12/09/1955 Today's Date: 01/17/2023   History of present illness Pt is a 67 y/o male admitted 9/11 with several weeks of abdominal pain along with a 30 pound weight loss. Work up included mesenteric ischemia and necrotic area of jejunum. S/p Exploratory laparotomy and small bowel resection 9/12, reopening of the laparotomy 9/14, second re-opening of laparotomy and more small bowel resection with anastomosis x2 9/16. 9/18 iHD, Extubated 9/22; 9/26 trans out of ICU; 9/29 rectal bleeding began 9/30-10/1 hypotensive and return to ICU with ongoing bleeding; 10/2 endoscopy and colonoscopy deep ulcerations encompassing 80% on the observed bowel with stigmata of recent bleeding. PMHx:testicular CA, HTN, ankle and patellar fx's, retinal detachment   OT comments  Pt at this time presented in bed and agreed to session with Physical Therapy. Mr. Overholt was educated about use of Stedy to attempt to complete a transfer in today's session and agreed to attempt. Pt with increase in time completed bridging to EOB, supine to sitting with min assist (x1 behind pt to increase in confidence while sitting at EOB) and pt was able to sit at EOB for about 5-8 mins. However, pt reported they needed to lay back down as feeling a little dizzy. Pt's vitals were taken and no significant changes at the time. Pt was encouraged then to attempt to complete lateral scooting at EOB and completed one scoot and then started to go back into supine with min assist. Acute Occupational Therapy will continue to follow at this time. Mr. Perreault is reported they would like to aim for AIR but was also educated about level of therapy at this setting.       If plan is discharge home, recommend the following:  Assist for transportation;Assistance with cooking/housework;Two people to help with walking and/or transfers;A lot of help with bathing/dressing/bathroom    Equipment Recommendations  None recommended by OT (TBD at next level of care)    Recommendations for Other Services Rehab consult    Precautions / Restrictions Precautions Precautions: Fall Precaution Comments: anxious Restrictions Weight Bearing Restrictions: No       Mobility Bed Mobility Overal bed mobility: Needs Assistance Bed Mobility: Supine to Sit, Sit to Supine Rolling: Min assist (x1 for attempt to allow pt to feel confident and motivate patient to complete task)     Sit to supine: Min assist        Transfers                   General transfer comment: Attempted to complete with pt transfer but declined     Balance     Sitting balance-Leahy Scale: Good Sitting balance - Comments: no LOB or significant changes in vital                                   ADL either performed or assessed with clinical judgement   ADL Overall ADL's : Needs assistance/impaired Eating/Feeding: Independent;Sitting   Grooming: Wash/dry hands;Wash/dry face;Set up;Sitting;Bed level   Upper Body Bathing: Contact guard assist;Sitting;Bed level   Lower Body Bathing: Moderate assistance;Maximal assistance;Bed level   Upper Body Dressing : Contact guard assist;Sitting   Lower Body Dressing: Moderate assistance;Maximal assistance;Bed level       Toileting- Clothing Manipulation and Hygiene: Maximal assistance;Total assistance;Bed level         General ADL Comments: deffered transfer per pt  Extremity/Trunk Assessment Upper Extremity Assessment Upper Extremity Assessment: Overall WFL for tasks assessed (Pt prefferes to attempt to start to use dumbells in session versus theraband)   Lower Extremity Assessment Lower Extremity Assessment: Defer to PT evaluation        Vision       Perception     Praxis      Cognition Arousal: Alert Behavior During Therapy: Flat affect Overall Cognitive Status: Within Functional Limits for tasks  assessed Area of Impairment: Problem solving                       Following Commands: Follows multi-step commands consistently   Awareness: Emergent Problem Solving: Slow processing General Comments: pt very slow to complete tasks        Exercises      Shoulder Instructions       General Comments VSS on RA, pt reported dizziness with no significant changes in BP. Able to sit EOB 5-8 minutes with CGA    Pertinent Vitals/ Pain       Pain Assessment Pain Assessment: Faces Faces Pain Scale: Hurts a little bit Facial Expression: Relaxed, neutral Body Movements: Absence of movements Muscle Tension: Relaxed Compliance with ventilator (intubated pts.): N/A Vocalization (extubated pts.): N/A CPOT Total: 0 Pain Location: B LE Pain Descriptors / Indicators: Discomfort Pain Intervention(s): Limited activity within patient's tolerance, Monitored during session, Premedicated before session, Repositioned  Home Living                                          Prior Functioning/Environment              Frequency  Min 1X/week        Progress Toward Goals  OT Goals(current goals can now be found in the care plan section)  Progress towards OT goals: Progressing toward goals  Acute Rehab OT Goals Patient Stated Goal: to go to AIR OT Goal Formulation: With patient Time For Goal Achievement: 01/25/23 Potential to Achieve Goals: Good ADL Goals Pt Will Perform Grooming: with set-up;sitting Pt Will Perform Upper Body Bathing: with min assist;sitting Pt Will Perform Upper Body Dressing: with min assist;sitting Pt Will Transfer to Toilet: with supervision;stand pivot transfer;bedside commode Pt/caregiver will Perform Home Exercise Program: Increased ROM;Both right and left upper extremity;With Supervision  Plan      Co-evaluation    PT/OT/SLP Co-Evaluation/Treatment: Yes Reason for Co-Treatment: Complexity of the patient's impairments  (multi-system involvement) PT goals addressed during session: Mobility/safety with mobility OT goals addressed during session: ADL's and self-care      AM-PAC OT "6 Clicks" Daily Activity     Outcome Measure   Help from another person eating meals?: None Help from another person taking care of personal grooming?: A Little Help from another person toileting, which includes using toliet, bedpan, or urinal?: A Lot Help from another person bathing (including washing, rinsing, drying)?: A Lot Help from another person to put on and taking off regular upper body clothing?: A Lot Help from another person to put on and taking off regular lower body clothing?: A Lot 6 Click Score: 15    End of Session    OT Visit Diagnosis: Unsteadiness on feet (R26.81);Muscle weakness (generalized) (M62.81)   Activity Tolerance Patient limited by fatigue   Patient Left in bed;with call bell/phone within reach;with bed alarm set   Nurse Communication Mobility  status        Time: 0936-1020 OT Time Calculation (min): 44 min  Charges: OT General Charges $OT Visit: 1 Visit OT Treatments $Self Care/Home Management : 38-52 mins  [name]  Acute Rehab Services  [phone] office number   Alphia Moh 01/17/2023, 12:05 PM

## 2023-01-17 NOTE — Progress Notes (Signed)
  Daily Progress Note  Subjective: No complaints, diet improved.   Objective: Vitals:   01/17/23 0821 01/17/23 1224  BP: (!) 165/68 (!) 168/80  Pulse: 81 81  Resp: 15 20  Temp: 98.2 F (36.8 C) 98.2 F (36.8 C)  SpO2: 97% 96%    Physical Examination Abdomen soft Nonlabored breathing   ASSESSMENT/PLAN:  Pt without bloody bowel movement.  Will continue to follow peripherally. Need to restart ASA 81mg  at some point. Will possible restartin the coming days   Victorino Sparrow MD [facility] [phone] 01/17/2023  2:09 PM

## 2023-01-17 NOTE — Progress Notes (Signed)
Calorie Count Note  Calorie count ordered. Day 5 results below:  Diet: regular Supplements: Boost Breeze po TID (250 kcal and 9 grams of protein per bottle), PROSource Plus po BID (100 kcal and 15 grams of protein per serving), Magic Cup po Tid (290 kcal and 9 grams of protein per serving)  Pt reports he did not get to eat much yesterday as he was in HD for 5 hours. Reports he was too tired to have dinner last night after returning. Didn't get to try Boost Breeze or PROSource Plus yet, but reports interested in trying. Requesting to only receive [employer] and not the other flavors on tray.  Pt didn't think he had anything from dinner tray last night as he was tired after being in HD for 5 hours. Receipt in envelope did not have any information filled out. However, after discussing with RN, she reports pt had Magic Cup from dinner tray last night.  Dinner 10/8: 100% Magic Cup per discussion with RN (290 kcal, 9 grams protein) Breakfast 10/9: 50% sausage, 100% of muffin and coffee with sweetener (285 kcal, 4.5 grams of protein) Lunch 10/9: 50% of cheeseburger with mayo, ketchup, mustard, tomato, and pickles, bites of fries, 100% 2 x apple juice (371 kcal, 17.5 grams of protein) Supplements: N/A  Total intake Day 5: 946 kcal (41% of minimum estimated needs)  31 grams of protein (22% of minimum estimated needs)  Estimated Nutritional Needs:  Kcal:  2300-2500 kcals Protein:  140 -150 g Fluid:  >/= 2.3 L  Nutrition Dx: Severe Malnutrition related to acute illness as evidenced by energy intake < or equal to 50% for > or equal to 5 days, percent weight loss.  -Ongoing.  Goal: Patient will meet greater than or equal to 90% of their needs  -Previously has been met with TPN. At this time unable to meet full nutrition requirements due to Clinimix and volume limitations.  Intervention:  -See follow-up RD note to come for updated nutrition intervention.  Letta Median, MS, RD,  LDN, CNSC Pager number available on Amion

## 2023-01-18 DIAGNOSIS — K551 Chronic vascular disorders of intestine: Secondary | ICD-10-CM | POA: Diagnosis not present

## 2023-01-18 DIAGNOSIS — R7989 Other specified abnormal findings of blood chemistry: Secondary | ICD-10-CM | POA: Diagnosis not present

## 2023-01-18 DIAGNOSIS — K559 Vascular disorder of intestine, unspecified: Secondary | ICD-10-CM | POA: Diagnosis not present

## 2023-01-18 LAB — COMPREHENSIVE METABOLIC PANEL
ALT: 29 U/L (ref 0–44)
AST: 28 U/L (ref 15–41)
Albumin: 1.5 g/dL — ABNORMAL LOW (ref 3.5–5.0)
Alkaline Phosphatase: 67 U/L (ref 38–126)
Anion gap: 13 (ref 5–15)
BUN: 127 mg/dL — ABNORMAL HIGH (ref 8–23)
CO2: 31 mmol/L (ref 22–32)
Calcium: 8.1 mg/dL — ABNORMAL LOW (ref 8.9–10.3)
Chloride: 95 mmol/L — ABNORMAL LOW (ref 98–111)
Creatinine, Ser: 5.19 mg/dL — ABNORMAL HIGH (ref 0.61–1.24)
GFR, Estimated: 11 mL/min — ABNORMAL LOW (ref >60)
Glucose, Bld: 115 mg/dL — ABNORMAL HIGH (ref 70–99)
Potassium: 4 mmol/L (ref 3.5–5.1)
Sodium: 139 mmol/L (ref 135–145)
Total Bilirubin: 0.4 mg/dL (ref 0.3–1.2)
Total Protein: 4.6 g/dL — ABNORMAL LOW (ref 6.5–8.1)

## 2023-01-18 LAB — CBC WITH DIFFERENTIAL/PLATELET
Abs Immature Granulocytes: 0.11 10*3/uL — ABNORMAL HIGH (ref 0.00–0.07)
Basophils Absolute: 0 10*3/uL (ref 0.0–0.1)
Basophils Relative: 0 %
Eosinophils Absolute: 0.3 10*3/uL (ref 0.0–0.5)
Eosinophils Relative: 6 %
HCT: 22.1 % — ABNORMAL LOW (ref 39.0–52.0)
Hemoglobin: 7.3 g/dL — ABNORMAL LOW (ref 13.0–17.0)
Immature Granulocytes: 2 %
Lymphocytes Relative: 7 %
Lymphs Abs: 0.4 10*3/uL — ABNORMAL LOW (ref 0.7–4.0)
MCH: 30 pg (ref 26.0–34.0)
MCHC: 33 g/dL (ref 30.0–36.0)
MCV: 90.9 fL (ref 80.0–100.0)
Monocytes Absolute: 0.4 10*3/uL (ref 0.1–1.0)
Monocytes Relative: 8 %
Neutro Abs: 4.4 10*3/uL (ref 1.7–7.7)
Neutrophils Relative %: 77 %
Platelets: 154 10*3/uL (ref 150–400)
RBC: 2.43 MIL/uL — ABNORMAL LOW (ref 4.22–5.81)
RDW: 15.2 % (ref 11.5–15.5)
WBC: 5.7 10*3/uL (ref 4.0–10.5)
nRBC: 0 % (ref 0.0–0.2)

## 2023-01-18 LAB — PREPARE RBC (CROSSMATCH)

## 2023-01-18 LAB — GLUCOSE, CAPILLARY
Glucose-Capillary: 111 mg/dL — ABNORMAL HIGH (ref 70–99)
Glucose-Capillary: 111 mg/dL — ABNORMAL HIGH (ref 70–99)
Glucose-Capillary: 120 mg/dL — ABNORMAL HIGH (ref 70–99)
Glucose-Capillary: 120 mg/dL — ABNORMAL HIGH (ref 70–99)
Glucose-Capillary: 98 mg/dL (ref 70–99)

## 2023-01-18 LAB — PHOSPHORUS: Phosphorus: 3.1 mg/dL (ref 2.5–4.6)

## 2023-01-18 LAB — MAGNESIUM: Magnesium: 1.9 mg/dL (ref 1.7–2.4)

## 2023-01-18 MED ORDER — HEPARIN SODIUM (PORCINE) 1000 UNIT/ML IJ SOLN
INTRAMUSCULAR | Status: AC
  Filled 2023-01-18: qty 4

## 2023-01-18 MED ORDER — K PHOS MONO-SOD PHOS DI & MONO 155-852-130 MG PO TABS
250.0000 mg | ORAL_TABLET | Freq: Once | ORAL | Status: AC
  Administered 2023-01-18: 250 mg via ORAL
  Filled 2023-01-18: qty 1

## 2023-01-18 MED ORDER — SODIUM CHLORIDE 0.9% IV SOLUTION: Freq: Once | INTRAVENOUS | Status: AC

## 2023-01-18 MED ORDER — HEPARIN SODIUM (PORCINE) 1000 UNIT/ML IJ SOLN
1000.0000 [IU] | Freq: Once | INTRAMUSCULAR | Status: AC
  Administered 2023-01-20: 3200 [IU] via INTRAVENOUS
  Filled 2023-01-18: qty 1

## 2023-01-18 MED ORDER — CLINIMIX/DEXTROSE (8/10) 8 % IV SOLN
INTRAVENOUS | Status: AC
  Filled 2023-01-18: qty 1000

## 2023-01-18 MED ORDER — FAT EMUL FISH OIL/PLANT BASED 20% (SMOFLIPID)IV EMUL
250.0000 mL | INTRAVENOUS | Status: AC
  Administered 2023-01-18: 250 mL via INTRAVENOUS
  Filled 2023-01-18: qty 250

## 2023-01-18 NOTE — Progress Notes (Signed)
[facility] Progress Note   Assessment/ Plan:   Acute kidney injury on CKD IIIb: Baseline creatinine level around 2.0.  Acute kidney injury likely ischemic ATN due to ex lap surgery/IV contrast/hypotension  Started on HD 9/18 due to inability to wean from vent and worsening renal function.  He was then switched to CRRT on 9/20-24. Course complicated by GI bleed.  Started on HD on 10/5 after nontunneled catheter placement with IR on 10/4 Reconsulted IR for a tunneled catheter- placed 10/8, appreciate assistance HD rolled over to today d/t high census on MWF- will keep TTS for now, next HD today Agree with not intervening on right RAS given that his right kidney is already much smaller than anticipated, discussed with VVS at that time. Likely not much function being contributed from right.   Strict ins and outs and daily weights Note that if contrast is clinically indicated urgently or emergently would then proceed with contrast  Acute mesenteric ischemia: Status post small bowel resection and superior mesenteric artery stent placement.  Followed by vascular surgery.  S/p OR again 9/16 for exlap and further small bowel resection, abd closed Metabolic acidosis - resolved Vent -dependent respiratory failure: Extubated and on room air and monitoring volume and resp status closely given large amount of inputs required.  Anemia due to acute blood loss: transfuse prn for hgb <7.  PRBC's per primary team. Multiple transfusions on 10/1.  S/p EGD and colonoscopy on 10/2. Getting PRBC's on 10/6.  Acute GI bleed - s/p PRBC's and GI is following.  S/p EGD and colonoscopy as above on 10/2 with severe mucosal ulceration in the rectum and rectosigmoid colon (felt ischemic or infectious per GI) Hypertension - shock resolved.  Avoid hypotension    Severe protein malnutrition - nutrition per primary team Transaminitis - may have been setting of shock - improving   Subjective:    Seen just before  dialysis.  Says he spilled his meds this AM down his front and is discouraged by that.     Objective:   BP (!) 149/72   Pulse 85   Temp 98.4 F (36.9 C)   Resp 13   Ht 5\' 11"  (1.803 m)   Wt 104.4 kg   SpO2 97%   BMI 32.10 kg/m   Physical Exam: Gen:NAD, lying flat in bed CVS: RRR Resp: clear Abd: soft Ext: no LE edema ACCESS: tunneled internal jugular cathter R side   Labs: BMET Recent Labs  Lab 01/12/23 0400 01/13/23 0400 01/14/23 0403 01/15/23 0520 01/18/23 0431  NA 135 138 137 140 139  K 4.5 4.8 3.9 4.1 4.0  CL 105 104 100 99 95*  CO2 17* 18* 25 28 31   GLUCOSE 125* 122* 127* 138* 115*  BUN 147* 160* 110* 131* 127*  CREATININE 3.51* 4.05* 3.17* 4.13* 5.19*  CALCIUM 8.0* 8.3* 7.7* 8.1* 8.1*  PHOS  --   --  4.3 4.5 3.1   CBC Recent Labs  Lab 01/14/23 0403 01/14/23 1620 01/15/23 0520 01/17/23 0607 01/18/23 0431  WBC 6.3  --  5.9 4.5 5.7  NEUTROABS  --   --   --   --  4.4  HGB 6.9* 7.5* 8.1* 7.4* 7.3*  HCT 20.9* 22.7* 24.1* 22.9* 22.1*  MCV 88.2  --  90.9 93.1 90.9  PLT 168  --  172 160 154      Medications:     (feeding supplement) PROSource Plus  30 mL Oral BID BM   amiodarone  200 mg Oral Daily   Chlorhexidine Gluconate Cloth  6 each Topical Q0600   feeding supplement  1 Container Oral TID BM   heparin sodium (porcine)       heparin sodium (porcine)  1,000 Units Intravenous Once   hydrALAZINE  50 mg Oral TID   multivitamin  1 tablet Oral QHS   pantoprazole (PROTONIX) IV  40 mg Intravenous Q12H   sodium chloride flush  3 mL Intravenous Q12H     Bufford Buttner, MD 01/18/2023, 10:40 AM

## 2023-01-18 NOTE — Procedures (Signed)
HD Note:  Some information was entered later than the data was gathered due to patient care needs. The stated time with the data is accurate.  Received patient in bed to unit.   Alert and oriented.   Informed consent signed and in chart.   Access used: upper right chest HD catheter Access issues: Lines were reversed after had difficulty with pulling heparin out.  Flushing showed the same issues with pulling, but the lumen pushed without difficulty. Lines were reversed.  BFR had to be reduced to 350 for effective treatment  Patient tolerated treatment well.   TX duration: 3.5 hours  Alert, without acute distress.  Total UF removed: 1500 ml  Hand-off given to patient's nurse.   Transported back to the room   Carlon Davidson L. Dareen Piano, RN Kidney Dialysis [facility].

## 2023-01-18 NOTE — Progress Notes (Signed)
Calorie Count Note  48-hour calorie count ordered.  Pt continues to have poor PO intake which has been consistent since the initiation of kcal count on 10/4. Discussed intake with RN, pt refused lunch from the hospital and stated his wife was going to bring in food. Requested the food he consumes from outside the hospital also be documented for tomorrow's kcal count.   Will continue kcal count for an additional 24 hours and if not significantly improved, will dc until PO has had a significant improvement in ability to take in PO.  Diet: Regular, thin liquids Supplements: Boost Breeze po TID, PROSource Plus po BID, Magic cup TID   Estimated Nutritional Needs:  Kcal:  2300-2500 kcals Protein:  140 -150 g Fluid:  >/= 2.3 L  10/9 Dinner: 440 kcal, 15g protein (1/2 ensure and 1/2 magic cup, 8 oz of OJ) 10/10 Breakfast: 88 kcal, 3g protein 10/10 Lunch: in HD, missed meal  Supplements: Refused prosource, only sips of boost breeze  Total intake: 528 kcal (23% of minimum estimated needs)  18 protein (13% of minimum estimated needs)  Nutrition Dx: Severe Malnutrition related to acute illness as evidenced by energy intake < or equal to 50% for > or equal to 5 days, percent weight loss.  -Ongoing.   Goal: Patient will meet greater than or equal to 90% of their needs  -Previously has been met with TPN. At this time unable to meet full nutrition requirements due to Clinimix and volume limitations.   Intervention:  -See follow-up RD note 10/9 for nutrition interventions.    [name] [name], RD, LDN Clinical Dietitian RD pager # available in AMION  After hours/weekend pager # available in [facility]

## 2023-01-18 NOTE — Progress Notes (Signed)
PHARMACY - TOTAL PARENTERAL NUTRITION CONSULT NOTE  Indication:  SMA syndrome w associated weight loss  Patient Measurements: Height: 5\' 11"  (180.3 cm) Weight: 99.8 kg (220 lb) IBW/kg (Calculated) : 75.3 TPN AdjBW (KG): 80.4 Body mass index is 30.68 kg/m. Usual Weight: 95 kg, has lost 50 lbs recently   Assessment:  67 yo M admitted with abdominal pain, weight loss, and poor PO intake. 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 He is now s/p SMA stent placement. He was initially left is discontinuity with open abdomen and was closed on 9/16. TPN was initiated 9/14.   Pt evaluated by speech therapy 9/29 - was able to tolerate trials of ice chips, thin liquid, and puree w/o overt signs or aspiration or difficulty swallowing.  MBS completed 9/30 - cognitive based oral dysphagia; recommended for trial of full liquids and upgrade as mentation allows. Requires full assistance for feeding until mentation improves.   10/9 switching to Clinimix 8/10 due to IVF shortage caused by [facility] per hospital policy. Will not be able to meet full nutrition goals due to limited stock and fluid restrictions. Will replace electrolytes and give lipids outside of Clinimix.   Glucose / Insulin: A1c 6.3% - CBGs < 140; off insulin Electrolytes: last labs 10/10, K 4.0 (then gave 40 mEq PO per plan 10/9), Mg 1.9 (then gave 1 g IV per plan 10/9), Phos down 3.1, CO2 28, Cl down 95, CoCa 10.1, Others wnl   Renal: off CRRT 9/24, iHD TTS, last HD 10/10. BUN 127 Hepatic: alk phos/AST/tbili wnl, ALT down 56, albumin 1.6, TG 100  Intake / Output; MIVF: UOP 0.4 mL/kg/hr, LBM 10/9 per charting (rectal bleed resolved). HD removed . GI Imaging:  9/20 CTA: improved ostial stenosis post stenting, thrombus/occlusion of SMA/ileocolic artery improving, surgical site has increased dilatation, L lung nodule GI Surgeries / Procedures:  9/14 re-ex lap, left in  discontinuity with plans for re-exploration  9/16 re-ex lap, SBR with anastomosis x2, abdominal closure 10/2 EGD: gastric erythematous mucosa, no active bleeding  10/2 Colo: Deep and severe mucosal ulceration in the rectum and in the recto-sigmoid colon, likely ischemic or infectious, not actively bleeding; Exam aborted in the descending colon 10/8 Tentative conversion to tunneled catheter   Central access: CVC [facility] 12/23/22 TPN start date: 12/23/22  Nutritional Goals: Goal concentrated TPN rate is 80 mL/hr (provides 140 g AA and 2400 kCal per day) Goal un-concentrated TPN is 100 mL/hr (provides 140g AA and 2253 kCal per day)  RD Estimated Needs Total Energy Estimated Needs: 2300-2500 kcals Total Protein Estimated Needs: 140 -150 g Total Fluid Estimated Needs: >/= 2.3 L  Current Nutrition:  TPN 10/2 CLD  10/3: (starting calorie count) regular diet, tolerating but not eating much 10/4-10/6: calorie count completed by RD, eating about 10-20% of calorie goals per RD note. Continue TPN. 10/7-10/8: NPO for procedure, calorie count ongoing 10/8: Regular diet 10/9: Regular diet, diet of ham sandwich and magic milkshake, per calorie count ate 41% of kcal and 22% of protein goals  Plan:  Continue Clinimix 8/10: Will alternate: - per day (60ml/hr) on MWFSun  - per day (42 ml/hr) on TTSat - Smof lipid daily To provide an average of 126 g AA and 1492 Kcal per day. Unable to meet full nutrition requirements due to Clinimix and volume limitations. Electrolytes: Give K Phos 250 mg PO x1 (~5 mmol), no K, Mag, or  calcium. Note Clinimix 8/10 does not have Na and Cl:Acet is set. Unable to use Clinimix 8/10 E due to excessive K and Phos for this patient.  Give PO renal MVI  Monitor TPN labs daily while on Clinimix F/u PO intake/calorie count and wean TPN soon  Thank you for allowing pharmacy to be a part of this patient's care.  Laqueta Jean PharmD Candidate 01/18/2023  11:45 AM

## 2023-01-18 NOTE — TOC Progression Note (Signed)
Transition of Care [facility]) - Progression Note    Patient Details  Name: [name] [name] MRN: [id number] Date of Birth: 04-Feb-1956  Transition of Care [facility]) CM/SW Contact  Graves-Bigelow, Lamar Laundry, RN Phone Number: 01/18/2023, 10:14 AM  Clinical Narrative: Patient was discussed in progression rounds. Patient initiated on HD 01-13-23 and tunneled HD cath placed 01-16-23. CIR has been following the patient; he may benefit from SNF once stable. Case Manager continuing to follow for transition of care needs as he progresses.   Expected Discharge Plan: Skilled Nursing Facility Barriers to Discharge: Continued Medical Work up  Expected Discharge Plan and Services In-house Referral: NA Discharge Planning Services: CM Consult Post Acute Care Choice: NA Living arrangements for the past 2 months: Single Family Home   Social Determinants of Health (SDOH) Interventions SDOH Screenings   Food Insecurity: No Food Insecurity (12/20/2022)  Housing: Low Risk  (12/20/2022)  Transportation Needs: No Transportation Needs (12/20/2022)  Utilities: Not At Risk (12/20/2022)  Alcohol Screen: Low Risk  (11/20/2022)  Depression (PHQ2-9): Low Risk  (11/20/2022)  Financial Resource Strain: Low Risk  (12/01/2022)  Physical Activity: Sufficiently Active (12/01/2022)  Social Connections: Moderately Integrated (12/01/2022)  Stress: No Stress Concern Present (12/01/2022)  Tobacco Use: Low Risk  (01/10/2023)  Health Literacy: Adequate Health Literacy (11/20/2022)   Readmission Risk Interventions    12/26/2022   11:40 AM  Readmission Risk Prevention Plan  Transportation Screening Complete  HRI or Home Care Consult Complete  Social Work Consult for Recovery Care Planning/Counseling Complete  Palliative Care Screening Not Applicable  Medication Review Oceanographer) Complete

## 2023-01-18 NOTE — Progress Notes (Signed)
Hgb stable today around 7.3.  no other noted reports of blood in his stool in last 24 hrs.  Continue to encourage oral intake.  We will follow up tomorrow.  Letha Cape 7:46 AM 01/18/2023

## 2023-01-18 NOTE — Progress Notes (Addendum)
[facility] PROGRESS NOTE    Progress Note  [name] [name]  ZOX:[id number] DOB: 10-04-1955 DOA: 12/20/2022 PCP: [name], MD     Brief Narrative:   [name] [name] is an 67 y.o. 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  Abdominal pain continued reported weight loss and poor oral intake.  Admitted on 9/11 with concerns of superior mesenteric artery thrombosis was started on IV heparin infusion vascular surgery was consulted, CT angiogram of abdomen and pelvis was done on 12/21/2022 that showed acute occlusion of the distal SMA as well as the inferior mesenteric artery aching to the OR emergently and was found to have a necrotic jejunum status post resection.  SMA stent was placed, patient left with an open wound and remained on mechanical ventilation postoperatively.  Had to be taken back to the OR several times as there was a concern of bowel integrity 12/25/2022 there were perforations so further surgical resection was done.  She was also changed to argatroban post op.  Had to be dialyzed started on 12/27/2022, subsequently extubated on 12/31/2022.  Argatroban was held on 01/07/2023 due to rectal bleeding, transfused 2 units of packed red blood cells due to drop in hemoglobin of 6.1.,  Transferred to [facility] on 01/09/2023.  Transferred back to the ICU on 01/09/2023 due to rectal bleeding anticoagulation was held. GI was consulted on 01/10/2019 for endoscopy showed no signs of bleeding and colonoscopy deep ulceration encompassing 80% of the bowel observed with stigmata of recent bleeding. Started back on HD on 01/14/2023. He will need some kind of rehab whether inpatient rehab or skilled nursing facility placement.  Significant  Events: 9/11 admitted with abdominal pain 9/12 CT demonstrating mesenteric occlusion, bowel resection and stent placed, to ICU on vent. Left in discontinuity  9/14 back to OR. Left in discontinuity as still concern about bowel integrity 9/16 back to OR  s sig areas of ischemia w/ 2 areas of perforation, another 66cm section of jejunum was resected and area of discontinuity was reanastomosed. Changed to argatroban post-op as unable to get heparin levels detectable  9/17 dropping RASS goal to 0 to -1, assessing for readiness to wean, serum creatinine and BUN still climbing some but making urine 9/18 iHD  9/20 rising pressor need, anemia: CTA abd: nothing emergent, transfused 9/22 extubated 9/25 poor tolerance of NGT clamping. Abx ended  9/26 trying clamping again. Adding PRN antihypertensives. Temp incr a bit, 100.6. holding off on HD and tunneled HD cath given incr UOP  9/26 transferred out of ICU 9/29 Argatroban stopped and held due to rectal bleeding 9/30 Hgb 6.1 at 1250, 2u PRBC ordered then had large bloody BM again overnight, repeat H/H pending. Was hypotensive initially to 70-80s but responded to 200cc IVF. Likely needs more blood, TRH to follow.  9/30 PCCM asked to see incase has ongoing bleeding and hypotension and needs ICU care again 10/1 ICU transfer for GI bleed. AC held.  10/2 endoscopy > no bleedin source identified 10/2 colonoscopy > deep ulcerations encompassing 80% on the observed bowel with stigmata of recent bleeding. 10/6-- HD restarted Eventual plan to rehab at an inpatient facility or skilled nursing facility.  Consultants:  Vascular GI Nephrology PCCM  Assessment/Plan:   Acute Superior mesenteric artery stenosis (HCC) Status post thrombectomy and SMA stent  placement, with exploratory laparotomy and bowel resection on 12/21/2022 followed by exploratory laparotomy x 2 on 12/23/2022 and 12/25/2022. Completed course of IV vancomycin and meropenem on 01/03/2023. Per  vascular surgery the majority of his bowel surviving after a 3 mm stent. Further management per vascular surgery. Continue daily wet-to-dry changes to the midline abdominal wound per general surgery. AC anticoagulation on hold, given ongoing anemic issues and risk of rebleeding outweigh the benefits in this specific circumstance, vascular surgery agrees to hold on anticoagulation indefinitely. Patient has a poor prognosis  Postoperative GI bleed/acute blood loss anemia: Colonoscopy in 10-2- 24 showed multiple ulcerations with evidence of bleeding he was transfused 2 units of packed red blood cells last 1 on 01/14/2023. Anticoagulation has been discontinued.  As he is not a candidate for anticoagulation due to multiple colonic ulcers at this point in time. This morning 7.3 will go ahead and give him a unit of packed red blood cells check a CBC post transfusional. Check a CBC tomorrow morning.  If hemoglobin continues to drift down will need to call GI to see if there is any procedure that can be done for the colonic ulcers  Paroxysmal A-fib with RVR: Initially started on amiodarone drip now changed to oral. Not a candidate for anticoagulation due to high risk of rebleeding. Now in sinus rhythm.  Essential hypertension/hyperlipidemia: Holding medication, blood pressures trending up.  ATN on chronic kidney disease stage III/[place] acidosis: Likely ischemic due to exploratory laparotomy, IV contrast and hypotension. Nephrology was consulted started on HD on 12/27/2022, eventually switched to CRRT on 12/29/2018 to 01/02/2023. Course was complicated by GI bleed, and restarted on HD on 01/13/2023 after nontunneled catheter placed by IR on 01/12/2023. IR has been consulted he is status post tunneled catheter on 01/16/2023. Last HD on 01/18/2023. Continue strict I's and O's. Further management per nephrology.  For HD Tuesday Thursday and Saturdays. Renal hoping that he could do dialysis sitting  down.  Physical deconditioning: PT OT evaluated the patient will need inpatient rehab. Out of bed to chair narcotics for pain control. CIR was consulted they think patient would not tolerate 3 hours of daily physical therapy however he progressed they would like to be re-consulted.  Nutrition: Tolerating diet since 01/11/2023. Still on TPN, further management per general surgery. Discussed with nutrition, we may need to go ahead and place a core track as his calorie count is not enough.  Morbid obesity: Noted.   Stage III 3 sacral decubitus ulcer: RN Pressure Injury Documentation: Pressure Injury 01/04/23 Buttocks Right;Upper Stage 3 -  Full thickness tissue loss. Subcutaneous fat may be visible but bone, tendon or muscle are NOT exposed. (Active)  01/04/23 2100  Location: Buttocks  Location Orientation: Right;Upper  Staging: Stage 3 -  Full thickness tissue loss. Subcutaneous fat may be visible but bone, tendon or muscle are NOT exposed.  Wound Description (Comments):   Present on Admission: No  Dressing Type Foam - Lift dressing to assess site every shift 01/16/23 2140    DVT prophylaxis: SCD Family Communication:wife Status is: Inpatient Remains inpatient appropriate because: Acute Mesenteric ischemia    Code Status:     Code Status Orders  (From admission, onward)           Start     Ordered   12/20/22 1618  Full code  Continuous       Question:  By:  Answer:  Consent: discussion documented in EHR   12/20/22 1619  Code Status History     Date Active Date Inactive Code Status Order ID Comments User Context   04/01/2021 0503 04/06/2021 0246 Full Code [id number]  Eduard Clos, MD ED   06/16/2014 1636 06/17/2014 1017 Full Code [id number]  Sherrie George, MD Inpatient         IV Access:   Peripheral IV   Procedures and diagnostic studies:   No results found.   Medical Consultants:   None.   Subjective:    Roarke Dutkiewicz Mcclimans  tolerated his diet but after dialysis does not feel great.  With no appetite.  Objective:    Vitals:   01/18/23 1230 01/18/23 1246 01/18/23 1252 01/18/23 1350  BP: (!) 169/85 (!) 163/81 (!) 147/82 (!) 151/77  Pulse: 81 80 81 77  Resp: 17 20 18 16   Temp:   97.7 F (36.5 C) 98 F (36.7 C)  TempSrc:    Oral  SpO2: 97% 98% 93% 97%  Weight:      Height:       SpO2: 97 % O2 Flow Rate (L/min): 1 L/min FiO2 (%): 40 %   Intake/Output Summary (Last 24 hours) at 01/18/2023 1442 Last data filed at 01/18/2023 1252 Gross per 24 hour  Intake 1586.63 ml  Output 901.5 ml  Net 685.13 ml   Filed Weights   01/16/23 1504 01/17/23 0345 01/18/23 0839  Weight: 101.2 kg 99.8 kg 104.4 kg    Exam: General exam: In no acute distress. Respiratory system: Good air movement and clear to auscultation. Cardiovascular system: S1 & S2 heard, RRR. No JVD. Gastrointestinal system: Abdomen is nondistended, soft and nontender.  Extremities: No pedal edema. Skin: No rashes, lesions or ulcers Psychiatry: Judgement and insight appear normal. Mood & affect appropriate.  Data Reviewed:    Labs: Basic Metabolic Panel: Recent Labs  Lab 01/12/23 0400 01/13/23 0400 01/14/23 0403 01/15/23 0520 01/18/23 0431  NA 135 138 137 140 139  K 4.5 4.8 3.9 4.1 4.0  CL 105 104 100 99 95*  CO2 17* 18* 25 28 31   GLUCOSE 125* 122* 127* 138* 115*  BUN 147* 160* 110* 131* 127*  CREATININE 3.51* 4.05* 3.17* 4.13* 5.19*  CALCIUM 8.0* 8.3* 7.7* 8.1* 8.1*  MG 2.3  --  2.0 2.2 1.9  PHOS  --   --  4.3 4.5 3.1   GFR Estimated Creatinine Clearance: 16.7 mL/min (A) (by C-G formula based on SCr of 5.19 mg/dL (H)). Liver Function Tests: Recent Labs  Lab 01/13/23 0400 01/14/23 0403 01/15/23 0520 01/18/23 0431  AST 35  --  24 28  ALT 81*  --  56* 29  ALKPHOS 83  --  75 67  BILITOT 0.6  --  0.4 0.4  PROT 4.7*  --  4.8* 4.6*  ALBUMIN 1.7* 1.5* 1.6* 1.5*   No results for input(s): "LIPASE", "AMYLASE" in the last  168 hours. No results for input(s): "AMMONIA" in the last 168 hours. Coagulation profile No results for input(s): "INR", "PROTIME" in the last 168 hours. COVID-19 Labs  No results for input(s): "DDIMER", "FERRITIN", "LDH", "CRP" in the last 72 hours.  Lab Results  Component Value Date   SARSCOV2NAA NEGATIVE 05/03/2021   SARSCOV2NAA NEGATIVE 04/05/2021   SARSCOV2NAA NEGATIVE 04/01/2021    CBC: Recent Labs  Lab 01/13/23 0400 01/14/23 0403 01/14/23 1620 01/15/23 0520 01/17/23 0607 01/18/23 0431  WBC 7.9 6.3  --  5.9 4.5 5.7  NEUTROABS  --   --   --   --   --  4.4  HGB 7.6* 6.9* 7.5* 8.1* 7.4* 7.3*  HCT 22.7* 20.9* 22.7* 24.1* 22.9* 22.1*  MCV 92.3 88.2  --  90.9 93.1 90.9  PLT 183 168  --  172 160 154   Cardiac Enzymes: No results for input(s): "CKTOTAL", "CKMB", "CKMBINDEX", "TROPONINI" in the last 168 hours. BNP (last 3 results) Recent Labs    07/11/22 1451  PROBNP 7.0   CBG: Recent Labs  Lab 01/17/23 1547 01/17/23 2041 01/18/23 0032 01/18/23 0710 01/18/23 1348  GLUCAP 110* 120* 120* 98 120*   D-Dimer: No results for input(s): "DDIMER" in the last 72 hours. Hgb A1c: No results for input(s): "HGBA1C" in the last 72 hours. Lipid Profile: No results for input(s): "CHOL", "HDL", "LDLCALC", "TRIG", "CHOLHDL", "LDLDIRECT" in the last 72 hours.  Thyroid function studies: No results for input(s): "TSH", "T4TOTAL", "T3FREE", "THYROIDAB" in the last 72 hours.  Invalid input(s): "FREET3" Anemia work up: No results for input(s): "VITAMINB12", "FOLATE", "FERRITIN", "TIBC", "IRON", "RETICCTPCT" in the last 72 hours. Sepsis Labs: Recent Labs  Lab 01/14/23 0403 01/15/23 0520 01/17/23 0607 01/18/23 0431  WBC 6.3 5.9 4.5 5.7   Microbiology No results found for this or any previous visit (from the past 240 hour(s)).   Medications:    (feeding supplement) PROSource Plus  30 mL Oral BID BM   amiodarone  200 mg Oral Daily   Chlorhexidine Gluconate Cloth  6 each  Topical Q0600   feeding supplement  1 Container Oral TID BM   heparin sodium (porcine)       heparin sodium (porcine)  1,000 Units Intravenous Once   hydrALAZINE  50 mg Oral TID   multivitamin  1 tablet Oral QHS   pantoprazole (PROTONIX) IV  40 mg Intravenous Q12H   phosphorus  250 mg Oral Once   sodium chloride flush  3 mL Intravenous Q12H   Continuous Infusions:  albumin human Stopped (01/01/23 1020)    ceFAZolin (ANCEF) IV     TPN (CLINIMIX) Adult without lytes     And   fat emul(SMOFlipid)     TPN (CLINIMIX) Adult without lytes 82 mL/hr at 01/18/23 0500      LOS: 29 days   Marinda Elk  [facility]  01/18/2023, 2:42 PM

## 2023-01-19 DIAGNOSIS — K559 Vascular disorder of intestine, unspecified: Secondary | ICD-10-CM | POA: Diagnosis not present

## 2023-01-19 DIAGNOSIS — R7989 Other specified abnormal findings of blood chemistry: Secondary | ICD-10-CM | POA: Diagnosis not present

## 2023-01-19 DIAGNOSIS — K551 Chronic vascular disorders of intestine: Secondary | ICD-10-CM | POA: Diagnosis not present

## 2023-01-19 LAB — GLUCOSE, CAPILLARY
Glucose-Capillary: 107 mg/dL — ABNORMAL HIGH (ref 70–99)
Glucose-Capillary: 121 mg/dL — ABNORMAL HIGH (ref 70–99)
Glucose-Capillary: 118 mg/dL — ABNORMAL HIGH (ref 70–99)
Glucose-Capillary: 127 mg/dL — ABNORMAL HIGH (ref 70–99)

## 2023-01-19 LAB — BPAM RBC
Blood Product Expiration Date: 202411092359
ISSUE DATE / TIME: 202410102316
Unit Type and Rh: 6200

## 2023-01-19 LAB — TYPE AND SCREEN
ABO/RH(D): A POS
Antibody Screen: NEGATIVE
Unit division: 0

## 2023-01-19 LAB — BASIC METABOLIC PANEL
Anion gap: 13 (ref 5–15)
BUN: 92 mg/dL — ABNORMAL HIGH (ref 8–23)
CO2: 25 mmol/L (ref 22–32)
Calcium: 7.9 mg/dL — ABNORMAL LOW (ref 8.9–10.3)
Chloride: 97 mmol/L — ABNORMAL LOW (ref 98–111)
Creatinine, Ser: 4.22 mg/dL — ABNORMAL HIGH (ref 0.61–1.24)
GFR, Estimated: 15 mL/min — ABNORMAL LOW (ref >60)
Glucose, Bld: 106 mg/dL — ABNORMAL HIGH (ref 70–99)
Potassium: 4 mmol/L (ref 3.5–5.1)
Sodium: 135 mmol/L (ref 135–145)

## 2023-01-19 LAB — CBC
HCT: 24 % — ABNORMAL LOW (ref 39.0–52.0)
Hemoglobin: 7.9 g/dL — ABNORMAL LOW (ref 13.0–17.0)
MCH: 28.8 pg (ref 26.0–34.0)
MCHC: 32.9 g/dL (ref 30.0–36.0)
MCV: 87.6 fL (ref 80.0–100.0)
Platelets: 145 10*3/uL — ABNORMAL LOW (ref 150–400)
RBC: 2.74 MIL/uL — ABNORMAL LOW (ref 4.22–5.81)
RDW: 14.8 % (ref 11.5–15.5)
WBC: 6.8 10*3/uL (ref 4.0–10.5)
nRBC: 0 % (ref 0.0–0.2)

## 2023-01-19 LAB — MAGNESIUM: Magnesium: 1.7 mg/dL (ref 1.7–2.4)

## 2023-01-19 LAB — PHOSPHORUS: Phosphorus: 3.3 mg/dL (ref 2.5–4.6)

## 2023-01-19 MED ORDER — TAMSULOSIN HCL 0.4 MG PO CAPS
0.4000 mg | ORAL_CAPSULE | Freq: Every day | ORAL | Status: DC
  Administered 2023-01-19 – 2023-01-22 (×4): 0.4 mg via ORAL
  Filled 2023-01-19 (×4): qty 1

## 2023-01-19 MED ORDER — CLINIMIX/DEXTROSE (8/10) 8 % IV SOLN
INTRAVENOUS | Status: AC
  Filled 2023-01-19 (×2): qty 2000

## 2023-01-19 MED ORDER — FAT EMUL FISH OIL/PLANT BASED 20% (SMOFLIPID)IV EMUL
250.0000 mL | INTRAVENOUS | Status: AC
  Administered 2023-01-19: 250 mL via INTRAVENOUS
  Filled 2023-01-19: qty 250

## 2023-01-19 MED ORDER — MAGNESIUM SULFATE IN D5W 1-5 GM/100ML-% IV SOLN
1.0000 g | Freq: Once | INTRAVENOUS | Status: AC
  Administered 2023-01-19: 1 g via INTRAVENOUS
  Filled 2023-01-19: qty 100

## 2023-01-19 MED ORDER — SODIUM CHLORIDE 0.9 % IV SOLN: INTRAVENOUS | Status: AC | PRN

## 2023-01-19 NOTE — Progress Notes (Signed)
Inpatient Rehab Admissions Coordinator:    Note improvement with therapies today, as he got out of bed. I will follow for potential admit once medically ready.   Megan Salon, MS, CCC-SLP Rehab Admissions Coordinator  [phone] (celll) [phone] (office)

## 2023-01-19 NOTE — Progress Notes (Signed)
Brief Nutrition Note  Discussed results of kcal count with providers on 10/10. Discussed that as pt's gut is working and he is tolerating a PO diet, it would be preferred to wean TPN and exchange for a cortrak tube if PO nutrition is lacking. Pt will not be able to discharge to CIR on TPN but would be able to discharge with cortrak.   Providers spoke with pt this AM and agree that TPN to be weaned today. Providers discussed the importance of increase PO intake with pt and that if PO goal could not be met, cor trak would be discussed next week.   Will follow-up with pt as planned to determine if intake is improving or if nutrition support is warranted. Discontinue calorie count and will revisit next week.  Greig Castilla, RD, LDN Clinical Dietitian RD pager # available in [facility]  After hours/weekend pager # available in [facility]

## 2023-01-19 NOTE — Progress Notes (Signed)
[facility] Progress Note   Assessment/ Plan:   Acute kidney injury on CKD IIIb: Baseline creatinine level around 2.0.  Acute kidney injury likely ischemic ATN due to ex lap surgery/IV contrast/hypotension  Started on HD 9/18 due to inability to wean from vent and worsening renal function.  He was then switched to CRRT on 9/20-24. Course complicated by GI bleed.  Started on HD on 10/5 after nontunneled catheter placement with IR on 10/4 Reconsulted IR for a tunneled catheter- placed 10/8, appreciate assistance HD rolled over to today d/t high census on MWF- will keep TTS for now, next HD Sat Agree with not intervening on right RAS given that his right kidney is already much smaller than anticipated, discussed with VVS at that time. Likely not much function being contributed from right.   Strict ins and outs and daily weights Note that if contrast is clinically indicated urgently or emergently would then proceed with contrast  Acute mesenteric ischemia: Status post small bowel resection and superior mesenteric artery stent placement.  Followed by vascular surgery.  S/p OR again 9/16 for exlap and further small bowel resection, abd closed Metabolic acidosis - resolved Vent -dependent respiratory failure: Extubated and on room air and monitoring volume and resp status closely given large amount of inputs required.  Anemia due to acute blood loss: transfuse prn for hgb <7.  PRBC's per primary team. Multiple transfusions on 10/1.  S/p EGD and colonoscopy on 10/2. Getting PRBC's on 10/6.  Acute GI bleed - s/p PRBC's and GI is following.  S/p EGD and colonoscopy as above on 10/2 with severe mucosal ulceration in the rectum and rectosigmoid colon (felt ischemic or infectious per GI) Severe protein malnutrition - nutrition per primary team, on TPN for now  Subjective:    Seen in room.  Sitting up in the chair!  HD yesterday, tolerated well.   Objective:   BP (!) 161/74 (BP Location: Left  Arm)   Pulse 80   Temp 98.4 F (36.9 C) (Oral)   Resp 14   Ht 5\' 11"  (1.803 m)   Wt 104.4 kg   SpO2 99%   BMI 32.10 kg/m   Physical Exam: Gen:NAD, lying flat in bed CVS: RRR Resp: clear Abd: soft Ext: no LE edema ACCESS: tunneled internal jugular cathter R side   Labs: BMET Recent Labs  Lab 01/13/23 0400 01/14/23 0403 01/15/23 0520 01/18/23 0431 01/19/23 0357  NA 138 137 140 139 135  K 4.8 3.9 4.1 4.0 4.0  CL 104 100 99 95* 97*  CO2 18* 25 28 31 25   GLUCOSE 122* 127* 138* 115* 106*  BUN 160* 110* 131* 127* 92*  CREATININE 4.05* 3.17* 4.13* 5.19* 4.22*  CALCIUM 8.3* 7.7* 8.1* 8.1* 7.9*  PHOS  --  4.3 4.5 3.1 3.3   CBC Recent Labs  Lab 01/15/23 0520 01/17/23 0607 01/18/23 0431 01/19/23 0357  WBC 5.9 4.5 5.7 6.8  NEUTROABS  --   --  4.4  --   HGB 8.1* 7.4* 7.3* 7.9*  HCT 24.1* 22.9* 22.1* 24.0*  MCV 90.9 93.1 90.9 87.6  PLT 172 160 154 145*      Medications:     (feeding supplement) PROSource Plus  30 mL Oral BID BM   amiodarone  200 mg Oral Daily   Chlorhexidine Gluconate Cloth  6 each Topical Q0600   feeding supplement  1 Container Oral TID BM   heparin sodium (porcine)  1,000 Units Intravenous Once   hydrALAZINE  50 mg Oral TID   multivitamin  1 tablet Oral QHS   pantoprazole (PROTONIX) IV  40 mg Intravenous Q12H   sodium chloride flush  3 mL Intravenous Q12H     Bufford Buttner, MD 01/19/2023, 11:51 AM

## 2023-01-19 NOTE — Progress Notes (Signed)
PHARMACY - TOTAL PARENTERAL NUTRITION CONSULT NOTE  Indication:  SMA syndrome w associated weight loss  Patient Measurements: Height: 5\' 11"  (180.3 cm) Weight: 104.4 kg (230 lb 2.6 oz) IBW/kg (Calculated) : 75.3 TPN AdjBW (KG): 80.4 Body mass index is 32.1 kg/m. Usual Weight: 95 kg, has lost 50 lbs recently   Assessment:  67 yo M admitted with abdominal pain, weight loss, and poor PO intake. 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 He is now s/p SMA stent placement. He was initially left is discontinuity with open abdomen and was closed on 9/16. TPN was initiated 9/14.   Pt evaluated by speech therapy 9/29 - was able to tolerate trials of ice chips, thin liquid, and puree w/o overt signs or aspiration or difficulty swallowing.  MBS completed 9/30 - cognitive based oral dysphagia; recommended for trial of full liquids and upgrade as mentation allows. Requires full assistance for feeding until mentation improves.   10/9 switching to Clinimix 8/10 due to IVF shortage caused by [facility] per hospital policy. Will not be able to meet full nutrition goals due to limited stock and fluid restrictions. Will replace electrolytes and give lipids outside of Clinimix.   Glucose / Insulin: A1c 6.3% - CBGs < 140; off insulin Electrolytes: K 4.0, Mg down 1.7, Phos up 3.3 (gave Kphos 250mg  PO), CO2 25, Cl up 97, CoCa 9.9, Others wnl   Renal: off CRRT 9/24, iHD TTS, last HD 10/10. BUN 92 Hepatic: alk phos/AST/tbili wnl, ALT down 56, albumin 1.6, TG 100  Intake / Output; MIVF: UOP 0.4 mL/kg/hr, LBM 10/9 per charting (rectal bleed resolved). HD removed . GI Imaging:  9/20 CTA: improved ostial stenosis post stenting, thrombus/occlusion of SMA/ileocolic artery improving, surgical site has increased dilatation, L lung nodule GI Surgeries / Procedures:  9/14 re-ex lap, left in discontinuity with plans for re-exploration  9/16 re-ex  lap, SBR with anastomosis x2, abdominal closure 10/2 EGD: gastric erythematous mucosa, no active bleeding  10/2 Colo: Deep and severe mucosal ulceration in the rectum and in the recto-sigmoid colon, likely ischemic or infectious, not actively bleeding; Exam aborted in the descending colon 10/8 Tentative conversion to tunneled catheter   Central access: CVC [facility] 12/23/22 TPN start date: 12/23/22  Nutritional Goals: Goal concentrated TPN rate is 80 mL/hr (provides 140 g AA and 2400 kCal per day) Goal un-concentrated TPN is 100 mL/hr (provides 140g AA and 2253 kCal per day)  RD Estimated Needs Total Energy Estimated Needs: 2300-2500 kcals Total Protein Estimated Needs: 140 -150 g Total Fluid Estimated Needs: >/= 2.3 L  Current Nutrition:  TPN 10/2 CLD  10/3: (starting calorie count) regular diet, tolerating but not eating much 10/4-10/6: calorie count completed by RD, eating about 10-20% of calorie goals per RD note. Continue TPN. 10/7-10/8: NPO for procedure, calorie count ongoing 10/8: Regular diet 10/9: Regular diet, diet of ham sandwich and magic milkshake, per calorie count ate 41% of kcal and 22% of protein goals 10/10: Regular diet, per calorie count ate 23% of kcal and 13% of protein goals (mashed potatoes, pot roast, 1/4 double cheese burger, 1/4 milkshake)  Plan:  Per surgery, wean to 1/2 rate TPN with plans to stop tomorrow Clinimix 8/10:  - 2000 mL per day (82 ml/hr) - Smof lipid daily To provide an average of 160 g AA and 1775 Kcal per day. Meeting ~70% of his kcal goals. Discussed with surgery to limit fluid  waste continue 2L clinimix with plans to not reorder TPN on 10/12. Electrolytes: Mag 1 g IV x1 (discussed with nephro), no K, phos, or calcium. Note Clinimix 8/10 does not have Na and Cl:Acet is set. Unable to use Clinimix 8/10 E due to excessive K and Phos for this patient.  Give PO renal MVI  Monitor TPN labs daily while on Clinimix F/u PO intake/calorie count  and wean TPN  Thank you for allowing pharmacy to be a part of this patient's care.  Laqueta Jean PharmD Candidate 01/19/2023 11:22 AM

## 2023-01-19 NOTE — Progress Notes (Addendum)
Patient ID: [name] [name], male   DOB: 1955-07-26, 67 y.o.   MRN: [id number] [facility] Surgery Progress Note  9 Days Post-Op  Subjective: Wife states he had a brown BM this morning.  Got 1 unit pRBC yesterday.  Hgb 7.9 today.  Up in a chair.  Patient states his appetite is coming back, but calorie count still remains fairly low.  Is struggling to drink his protein drinks as he doesn't like them.  Objective: Vital signs in last 24 hours: Temp:  [97.7 F (36.5 C)-98.6 F (37 C)] 98.4 F (36.9 C) (10/11 0757) Pulse Rate:  [74-88] 80 (10/11 0757) Resp:  [13-20] 14 (10/11 0757) BP: (147-172)/(57-129) 161/74 (10/11 0757) SpO2:  [93 %-99 %] 99 % (10/11 0757) Arterial Line BP: (149)/(78) 149/78 (10/10 2345) Last BM Date : 01/17/23  Intake/Output from previous day: 10/10 0701 - 10/11 0700 In: 1241.7 [I.V.:586.7; Blood:655] Out: 1.5  Intake/Output this shift: No intake/output data recorded.  PE: Gen: alert, NAD Pulm: respirations unlabored  Abd: soft, nondistended, nontender, wound is clean and superior aspect well healed, inferior with beefy red granulation tissue present.   Lab Results:  Recent Labs    01/18/23 0431 01/19/23 0357  WBC 5.7 6.8  HGB 7.3* 7.9*  HCT 22.1* 24.0*  PLT 154 145*   BMET Recent Labs    01/18/23 0431 01/19/23 0357  NA 139 135  K 4.0 4.0  CL 95* 97*  CO2 31 25  GLUCOSE 115* 106*  BUN 127* 92*  CREATININE 5.19* 4.22*  CALCIUM 8.1* 7.9*   PT/INR No results for input(s): "LABPROT", "INR" in the last 72 hours. CMP     Component Value Date/Time   NA 135 01/19/2023 0357   K 4.0 01/19/2023 0357   CL 97 (L) 01/19/2023 0357   CO2 25 01/19/2023 0357   GLUCOSE 106 (H) 01/19/2023 0357   BUN 92 (H) 01/19/2023 0357   CREATININE 4.22 (H) 01/19/2023 0357   CALCIUM 7.9 (L) 01/19/2023 0357   PROT 4.6 (L) 01/18/2023 0431   PROT 6.3 03/16/2020 0800   ALBUMIN 1.5 (L) 01/18/2023 0431   ALBUMIN 4.3 03/16/2020 0800   AST 28 01/18/2023 0431   ALT  29 01/18/2023 0431   ALKPHOS 67 01/18/2023 0431   BILITOT 0.4 01/18/2023 0431   BILITOT 0.8 03/16/2020 0800   GFRNONAA 15 (L) 01/19/2023 0357   GFRAA >60 04/14/2015 1127   Lipase     Component Value Date/Time   LIPASE 28 11/27/2022 0755       Studies/Results: No results found.  Anti-infectives: Anti-infectives (From admission, onward)    Start     Dose/Rate Route Frequency Ordered Stop   01/16/23 0820  ceFAZolin (ANCEF) IVPB 2g/100 mL premix        over 30 Minutes Intravenous Continuous PRN 01/16/23 0845 01/16/23 0855   01/15/23 1000  ceFAZolin (ANCEF) IVPB 2g/100 mL premix       Note to Pharmacy: Give in IR for surgical prophylaxis   2 g 200 mL/hr over 30 Minutes Intravenous  Once 01/14/23 1818     12/30/22 1800  vancomycin (VANCOCIN) IVPB 1000 mg/200 mL premix        1,000 mg 200 mL/hr over 60 Minutes Intravenous Every 24 hours 12/29/22 1844 01/03/23 1946   12/30/22 0600  vancomycin (VANCOCIN) IVPB 1000 mg/200 mL premix  Status:  Discontinued        1,000 mg 200 mL/hr over 60 Minutes Intravenous Every 12 hours 12/29/22 1842 12/29/22 1844  12/29/22 2200  meropenem (MERREM) 1 g in sodium chloride 0.9 % 100 mL IVPB  Status:  Discontinued        1 g 200 mL/hr over 30 Minutes Intravenous Every 12 hours 12/29/22 1705 12/29/22 1842   12/29/22 1930  meropenem (MERREM) 1 g in sodium chloride 0.9 % 100 mL IVPB        1 g 200 mL/hr over 30 Minutes Intravenous Every 8 hours 12/29/22 1842 01/04/23 0700   12/29/22 1800  vancomycin (VANCOREADY) IVPB 2000 mg/400 mL        2,000 mg 200 mL/hr over 120 Minutes Intravenous  Once 12/29/22 1705 12/29/22 1958   12/23/22 1230  piperacillin-tazobactam (ZOSYN) IVPB 2.25 g        2.25 g 100 mL/hr over 30 Minutes Intravenous Every 8 hours 12/23/22 1139 12/28/22 2109   12/22/22 0000  piperacillin-tazobactam (ZOSYN) IVPB 3.375 g  Status:  Discontinued        3.375 g 12.5 mL/hr over 240 Minutes Intravenous Every 8 hours 12/21/22 2331 12/23/22  1139        Assessment/Plan 67 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 Karin Lieu, exploratory laparotomy and small bowel resection Dr. Janee Morn -S/p re-exploration, replace abthera FB 12/23/22   -S/p re-exploration, small bowel resection with anastomosis x2, assessment of bowel perfusion with ICG, abdominal closure 9/16 Dr. Andrey Campanile - BRBPR s/p egd/colonoscopy 10/2 that revealed severe ulceration in the rectum and recto- sigmoid colon, no active bleeding at the time. Biopsy showed ulceration with inflammatory changes consistent with ischemia and no viral cytopathic effect, stains pending - no further bleeding at this time.  Will continue to monitor. - hgb 7.9.  continue to monitor.  If rebleeds, may need to re-engage GI to see if they can repeat c-scope and spray hemostatic spray to help with clotting.  Not many other options for this patient. -cont diet and Breeze TID.  Patient only meeting about 25% of his goals and less so of protein.  We had a long discussion today regarding this issue.  We discussed that we need to start weaning the TNA as his gut works.  This means oral intake from him, but if he can not meet these needs, then he may require a Cortrak to be placed for supplemental TFs.  We will continue to encourage oral intake and supplemental intake over the weekend, but if this remains poor, we will need to revisit the idea of a Cortrak next week.   -Given ongoing anemic issues and intermittent oozing, would hold off on anticoagulation indefinitely since the risk of bleeding outweigh the benefits in the circumstance. - daily wet to dry dressing changes to midline abdominal wound  Discussed patient at length with Dr. Robb Matar and dietitian on the ward and via secure chat.  Wife at bedside and discussion included her as well while visiting the patient.   FEN: reg, TPN, wean 1/2 rate today and off tomorrow, protein shakes VTE: all on  hold indefinitely due to bleeding risk ID: No current abx    LOS: 30 days    Letha Cape, [facility] Surgery 01/19/2023, 10:04 AM Please see Amion for pager number during day hours 7:00am-4:30pm

## 2023-01-19 NOTE — Progress Notes (Signed)
[facility] PROGRESS NOTE    Progress Note  [name] [name]  WGN:[id number] DOB: 27-Mar-1956 DOA: 12/20/2022 PCP: [name], MD     Brief Narrative:   [name] [name] is an 67 y.o. 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  Abdominal pain continued reported weight loss and poor oral intake.  Admitted on 9/11 with concerns of superior mesenteric artery thrombosis was started on IV heparin infusion vascular surgery was consulted, CT angiogram of abdomen and pelvis was done on 12/21/2022 that showed acute occlusion of the distal SMA as well as the inferior mesenteric artery aching to the OR emergently and was found to have a necrotic jejunum status post resection.  SMA stent was placed, patient left with an open wound and remained on mechanical ventilation postoperatively.  Had to be taken back to the OR several times as there was a concern of bowel integrity 12/25/2022 there were perforations so further surgical resection was done.  She was also changed to argatroban post op.  Had to be dialyzed started on 12/27/2022, subsequently extubated on 12/31/2022.  Argatroban was held on 01/07/2023 due to rectal bleeding, transfused 2 units of packed red blood cells due to drop in hemoglobin of 6.1.,  Transferred to [facility] on 01/09/2023.  Transferred back to the ICU on 01/09/2023 due to rectal bleeding anticoagulation was held. GI was consulted on 01/10/2019 for endoscopy showed no signs of bleeding and colonoscopy deep ulceration encompassing 80% of the bowel observed with stigmata of recent bleeding. Started back on HD on 01/14/2023. He will need some kind of rehab whether inpatient rehab or skilled nursing facility placement.  Significant  Events: 9/11 admitted with abdominal pain 9/12 CT demonstrating mesenteric occlusion, bowel resection and stent placed, to ICU on vent. Left in discontinuity  9/14 back to OR. Left in discontinuity as still concern about bowel integrity 9/16 back to OR  s sig areas of ischemia w/ 2 areas of perforation, another 66cm section of jejunum was resected and area of discontinuity was reanastomosed. Changed to argatroban post-op as unable to get heparin levels detectable  9/17 dropping RASS goal to 0 to -1, assessing for readiness to wean, serum creatinine and BUN still climbing some but making urine 9/18 iHD  9/20 rising pressor need, anemia: CTA abd: nothing emergent, transfused 9/22 extubated 9/25 poor tolerance of NGT clamping. Abx ended  9/26 trying clamping again. Adding PRN antihypertensives. Temp incr a bit, 100.6. holding off on HD and tunneled HD cath given incr UOP  9/26 transferred out of ICU 9/29 Argatroban stopped and held due to rectal bleeding 9/30 Hgb 6.1 at 1250, 2u PRBC ordered then had large bloody BM again overnight, repeat H/H pending. Was hypotensive initially to 70-80s but responded to 200cc IVF. Likely needs more blood, TRH to follow.  9/30 PCCM asked to see incase has ongoing bleeding and hypotension and needs ICU care again 10/1 ICU transfer for GI bleed. AC held.  10/2 endoscopy > no bleedin source identified 10/2 colonoscopy > deep ulcerations encompassing 80% on the observed bowel with stigmata of recent bleeding. 10/6-- HD restarted Eventual plan to rehab at an inpatient facility or skilled nursing facility.  Consultants:  Vascular GI Nephrology PCCM  Assessment/Plan:   Acute Superior mesenteric artery stenosis (HCC) Status post thrombectomy and SMA stent  placement, with exploratory laparotomy and bowel resection on 12/21/2022 followed by exploratory laparotomy x 2 on 12/23/2022 and 12/25/2022. Completed course of IV vancomycin and meropenem on 01/03/2023. Per  vascular surgery the majority of his bowel surviving after a 3 mm stent. Further management per vascular surgery. Continue daily wet-to-dry changes to the midline abdominal wound per general surgery. AC anticoagulation on hold, given ongoing anemic issues and risk of rebleeding outweigh the benefits in this specific circumstance, vascular surgery agrees to hold on anticoagulation indefinitely. Patient has a poor prognosis. I did discuss with him nutrition and option of NG tube and NG tube feedings as his albumin is 1.5. Patient to work with physical therapy to see if he can be reevaluated by inpatient rehab.  Postoperative GI bleed/acute blood loss anemia: Colonoscopy on 10-2- 24 showed multiple ulcerations with evidence of bleeding he was transfused 3 units of packed red blood cells last 1 on 01/18/2023. Anticoagulation has been discontinued.  As he is not a candidate for anticoagulation due to multiple colonic ulcers at this point in time. This morning 7.3 will go ahead and give him a unit of packed red blood cells check a CBC post transfusional. Hemoglobin this morning 7.9.  Paroxysmal A-fib with RVR: Initially started on amiodarone drip now changed to oral. Not a candidate for anticoagulation due to high risk of rebleeding. Now in sinus rhythm.  Essential hypertension/hyperlipidemia: Holding medication, blood pressures trending up.  ATN on chronic kidney disease stage III/[place] acidosis: Likely ischemic due to exploratory laparotomy, IV contrast and hypotension. Nephrology was consulted started on HD on 12/27/2022, eventually switched to CRRT on 12/29/2018 to 01/02/2023. Course was complicated by GI bleed, and restarted on HD on 01/13/2023 after nontunneled catheter placed by IR on 01/12/2023. IR has been consulted he is status post tunneled catheter on 01/16/2023. Last HD on 01/18/2023. Continue strict I's and O's.  Urine output was about 900 cc. Further management per nephrology.  For  HD Tuesday Thursday and Saturdays. Renal hoping that he could do dialysis sitting down. Continue out of bed to chair, continue to work with physical therapy.  Physical deconditioning: PT OT evaluated the patient will need rehab. Out of bed to chair narcotics for pain control. CIR was consulted they think patient would not tolerate 3 hours of daily. PT reconsulted.  Nutrition: Tolerating diet since 01/11/2023. Still on TPN, further management per general surgery. He will think about course back for feeding.  Relates his appetite has returned.  Morbid obesity: Noted.   Stage III 3 sacral decubitus ulcer: RN Pressure Injury Documentation: Pressure Injury 01/04/23 Buttocks Right;Upper Stage 3 -  Full thickness tissue loss. Subcutaneous fat may be visible but bone, tendon or muscle are NOT exposed. (Active)  01/04/23 2100  Location: Buttocks  Location Orientation: Right;Upper  Staging: Stage 3 -  Full thickness tissue loss. Subcutaneous fat may be visible but bone, tendon or muscle are NOT exposed.  Wound Description (Comments):   Present on Admission: No  Dressing Type Foam - Lift dressing to assess site every shift 01/18/23 0700    DVT prophylaxis: SCD Family Communication:wife Status is: Inpatient Remains inpatient appropriate because: Acute Mesenteric ischemia    Code Status:     Code Status Orders  (From admission, onward)           Start     Ordered   12/20/22 1618  Full code  Continuous       Question:  By:  Answer:  Consent: discussion documented in  EHR   12/20/22 1619           Code Status History     Date Active Date Inactive Code Status Order ID Comments User Context   04/01/2021 0503 04/06/2021 0246 Full Code [id number]  Eduard Clos, MD ED   06/16/2014 1636 06/17/2014 1017 Full Code [id number]  Sherrie George, MD Inpatient         IV Access:   Peripheral IV   Procedures and diagnostic studies:   No results found.   Medical  Consultants:   None.   Subjective:    [name] [name] was better this morning.  Objective:    Vitals:   01/19/23 0100 01/19/23 0230 01/19/23 0500 01/19/23 0757  BP: (!) 151/129  (!) 157/75 (!) 161/74  Pulse: 88  79 80  Resp:  16 17 14   Temp:  98.2 F (36.8 C) 97.9 F (36.6 C) 98.4 F (36.9 C)  TempSrc:  Oral Oral Oral  SpO2: 97%  99% 99%  Weight:      Height:       SpO2: 99 % O2 Flow Rate (L/min): 1 L/min FiO2 (%): 40 %   Intake/Output Summary (Last 24 hours) at 01/19/2023 0826 Last data filed at 01/19/2023 0400 Gross per 24 hour  Intake 1241.67 ml  Output 1.5 ml  Net 1240.17 ml   Filed Weights   01/16/23 1504 01/17/23 0345 01/18/23 0839  Weight: 101.2 kg 99.8 kg 104.4 kg    Exam: General exam: No acute distress lying comfortably in bed Respiratory system: Good air movement and clear to auscultation. Cardiovascular system: Positive S1-S2 regular rate and rhythm Gastrointestinal system: Midline dressing, positive bowel sounds, nontender nondistended Extremities: Trace edema Skin: No rashes, lesions or ulcers Psychiatry: Judgement and insight appear normal. Mood & affect appropriate.  Data Reviewed:    Labs: Basic Metabolic Panel: Recent Labs  Lab 01/13/23 0400 01/14/23 0403 01/15/23 0520 01/18/23 0431 01/19/23 0357  NA 138 137 140 139 135  K 4.8 3.9 4.1 4.0 4.0  CL 104 100 99 95* 97*  CO2 18* 25 28 31 25   GLUCOSE 122* 127* 138* 115* 106*  BUN 160* 110* 131* 127* 92*  CREATININE 4.05* 3.17* 4.13* 5.19* 4.22*  CALCIUM 8.3* 7.7* 8.1* 8.1* 7.9*  MG  --  2.0 2.2 1.9 1.7  PHOS  --  4.3 4.5 3.1 3.3   GFR Estimated Creatinine Clearance: 20.6 mL/min (A) (by C-G formula based on SCr of 4.22 mg/dL (H)). Liver Function Tests: Recent Labs  Lab 01/13/23 0400 01/14/23 0403 01/15/23 0520 01/18/23 0431  AST 35  --  24 28  ALT 81*  --  56* 29  ALKPHOS 83  --  75 67  BILITOT 0.6  --  0.4 0.4  PROT 4.7*  --  4.8* 4.6*  ALBUMIN 1.7* 1.5* 1.6* 1.5*    No results for input(s): "LIPASE", "AMYLASE" in the last 168 hours. No results for input(s): "AMMONIA" in the last 168 hours. Coagulation profile No results for input(s): "INR", "PROTIME" in the last 168 hours. COVID-19 Labs  No results for input(s): "DDIMER", "FERRITIN", "LDH", "CRP" in the last 72 hours.  Lab Results  Component Value Date   SARSCOV2NAA NEGATIVE 05/03/2021   SARSCOV2NAA NEGATIVE 04/05/2021   SARSCOV2NAA NEGATIVE 04/01/2021    CBC: Recent Labs  Lab 01/14/23 0403 01/14/23 1620 01/15/23 0520 01/17/23 0607 01/18/23 0431 01/19/23 0357  WBC 6.3  --  5.9 4.5 5.7 6.8  NEUTROABS  --   --   --   --  4.4  --   HGB 6.9* 7.5* 8.1* 7.4* 7.3* 7.9*  HCT 20.9* 22.7* 24.1* 22.9* 22.1* 24.0*  MCV 88.2  --  90.9 93.1 90.9 87.6  PLT 168  --  172 160 154 145*   Cardiac Enzymes: No results for input(s): "CKTOTAL", "CKMB", "CKMBINDEX", "TROPONINI" in the last 168 hours. BNP (last 3 results) Recent Labs    07/11/22 1451  PROBNP 7.0   CBG: Recent Labs  Lab 01/18/23 0710 01/18/23 1348 01/18/23 1709 01/18/23 2109 01/19/23 0725  GLUCAP 98 120* 111* 111* 107*   D-Dimer: No results for input(s): "DDIMER" in the last 72 hours. Hgb A1c: No results for input(s): "HGBA1C" in the last 72 hours. Lipid Profile: No results for input(s): "CHOL", "HDL", "LDLCALC", "TRIG", "CHOLHDL", "LDLDIRECT" in the last 72 hours.  Thyroid function studies: No results for input(s): "TSH", "T4TOTAL", "T3FREE", "THYROIDAB" in the last 72 hours.  Invalid input(s): "FREET3" Anemia work up: No results for input(s): "VITAMINB12", "FOLATE", "FERRITIN", "TIBC", "IRON", "RETICCTPCT" in the last 72 hours. Sepsis Labs: Recent Labs  Lab 01/15/23 0520 01/17/23 0607 01/18/23 0431 01/19/23 0357  WBC 5.9 4.5 5.7 6.8   Microbiology No results found for this or any previous visit (from the past 240 hour(s)).   Medications:    (feeding supplement) PROSource Plus  30 mL Oral BID BM    amiodarone  200 mg Oral Daily   Chlorhexidine Gluconate Cloth  6 each Topical Q0600   feeding supplement  1 Container Oral TID BM   heparin sodium (porcine)  1,000 Units Intravenous Once   hydrALAZINE  50 mg Oral TID   multivitamin  1 tablet Oral QHS   pantoprazole (PROTONIX) IV  40 mg Intravenous Q12H   sodium chloride flush  3 mL Intravenous Q12H   Continuous Infusions:  albumin human Stopped (01/01/23 1020)    ceFAZolin (ANCEF) IV     TPN (CLINIMIX) Adult without lytes 42 mL/hr at 01/19/23 0400      LOS: 30 days   Marinda Elk  [facility]  01/19/2023, 8:26 AM       Urine clear to auscultation.

## 2023-01-19 NOTE — Progress Notes (Signed)
Physical Therapy Treatment Patient Details Name: [name] [name] MRN: [id number] DOB: 01-Jul-1955 Today's Date: 01/19/2023   History of Present Illness Pt is a 67 y/o male admitted 9/11 with several weeks of abdominal pain along with a 30 pound weight loss. Work up included mesenteric ischemia and necrotic area of jejunum. S/p Exploratory laparotomy and small bowel resection 9/12, reopening of the laparotomy 9/14, second re-opening of laparotomy and more small bowel resection with anastomosis x2 9/16. 9/18 iHD, Extubated 9/22; 9/26 trans out of ICU; 9/29 rectal bleeding began 9/30-10/1 hypotensive and return to ICU with ongoing bleeding; 10/2 endoscopy and colonoscopy deep ulcerations encompassing 80% on the observed bowel with stigmata of recent bleeding. PMHx:testicular CA, HTN, ankle and patellar fx's, retinal detachment    PT Comments  Pt progressing well with mobility. Able to transfer OOB this session. Min assist bed mobility, min assist sit to stand in stedy, and dependent stedy transfer bed to recliner. Pt in recliner with feet elevated at end of session.     If plan is discharge home, recommend the following: A lot of help with walking and/or transfers;A lot of help with bathing/dressing/bathroom;Assistance with cooking/housework;Assist for transportation   Can travel by private vehicle        Equipment Recommendations  Rolling walker (2 wheels);BSC/3in1    Recommendations for Other Services       Precautions / Restrictions Precautions Precautions: Fall;Other (comment) Precaution Comments: anxious     Mobility  Bed Mobility Overal bed mobility: Needs Assistance Bed Mobility: Rolling, Sidelying to Sit Rolling: Min assist, Used rails Sidelying to sit: Min assist, Used rails, HOB elevated       General bed mobility comments: cues for sequencing    Transfers Overall transfer level: Needs assistance   Transfers: Sit to/from Stand, Bed to chair/wheelchair/BSC Sit to  Stand: Min assist Stand pivot transfers: Total assist         General transfer comment: sit to stand in stedy min assist, dependent stedy transfer bed to recliner Transfer via Lift Equipment: Stedy  Ambulation/Gait                   Stairs             Wheelchair Mobility     Tilt Bed    Modified Rankin (Stroke Patients Only)       Balance Overall balance assessment: Needs assistance Sitting-balance support: Feet supported, No upper extremity supported Sitting balance-Leahy Scale: Good     Standing balance support: Bilateral upper extremity supported, During functional activity, Reliant on assistive device for balance Standing balance-Leahy Scale: Poor                              Cognition Arousal: Alert Behavior During Therapy: Flat affect (mildly anxious regarding mobiity) Overall Cognitive Status: Within Functional Limits for tasks assessed                                          Exercises      General Comments General comments (skin integrity, edema, etc.): VSS on RA. Pt with c/o mild dizziness with sitting.      Pertinent Vitals/Pain Pain Assessment Pain Assessment: No/denies pain    Home Living  Prior Function            PT Goals (current goals can now be found in the care plan section) Acute Rehab PT Goals Patient Stated Goal: rehab then home Progress towards PT goals: Progressing toward goals    Frequency    Min 1X/week      PT Plan      Co-evaluation              AM-PAC PT "6 Clicks" Mobility   Outcome Measure  Help needed turning from your back to your side while in a flat bed without using bedrails?: A Little Help needed moving from lying on your back to sitting on the side of a flat bed without using bedrails?: A Little Help needed moving to and from a bed to a chair (including a wheelchair)?: Total Help needed standing up from a chair using  your arms (e.g., wheelchair or bedside chair)?: A Little Help needed to walk in hospital room?: Total Help needed climbing 3-5 steps with a railing? : Total 6 Click Score: 12    End of Session Equipment Utilized During Treatment: Gait belt Activity Tolerance: Patient tolerated treatment well Patient left: in chair;with call bell/phone within reach;with family/visitor present Nurse Communication: Mobility status PT Visit Diagnosis: Other abnormalities of gait and mobility (R26.89);Muscle weakness (generalized) (M62.81)     Time: 5409-8119 PT Time Calculation (min) (ACUTE ONLY): 23 min  Charges:    $Therapeutic Activity: 23-37 mins PT General Charges $$ ACUTE PT VISIT: 1 Visit                     Ferd Glassing., PT  Office # [phone]    Ilda Foil 01/19/2023, 9:19 AM

## 2023-01-20 DIAGNOSIS — N170 Acute kidney failure with tubular necrosis: Secondary | ICD-10-CM

## 2023-01-20 DIAGNOSIS — E43 Unspecified severe protein-calorie malnutrition: Secondary | ICD-10-CM | POA: Diagnosis not present

## 2023-01-20 DIAGNOSIS — K551 Chronic vascular disorders of intestine: Secondary | ICD-10-CM | POA: Diagnosis not present

## 2023-01-20 DIAGNOSIS — N1831 Chronic kidney disease, stage 3a: Secondary | ICD-10-CM | POA: Diagnosis not present

## 2023-01-20 LAB — CBC
HCT: 26.3 % — ABNORMAL LOW (ref 39.0–52.0)
HCT: 25.9 % — ABNORMAL LOW (ref 39.0–52.0)
Hemoglobin: 8.7 g/dL — ABNORMAL LOW (ref 13.0–17.0)
Hemoglobin: 8.6 g/dL — ABNORMAL LOW (ref 13.0–17.0)
MCH: 29.1 pg (ref 26.0–34.0)
MCH: 29.1 pg (ref 26.0–34.0)
MCHC: 33.1 g/dL (ref 30.0–36.0)
MCHC: 33.2 g/dL (ref 30.0–36.0)
MCV: 88 fL (ref 80.0–100.0)
MCV: 87.5 fL (ref 80.0–100.0)
Platelets: 145 10*3/uL — ABNORMAL LOW (ref 150–400)
Platelets: 147 10*3/uL — ABNORMAL LOW (ref 150–400)
RBC: 2.99 MIL/uL — ABNORMAL LOW (ref 4.22–5.81)
RBC: 2.96 MIL/uL — ABNORMAL LOW (ref 4.22–5.81)
RDW: 14.6 % (ref 11.5–15.5)
RDW: 14.4 % (ref 11.5–15.5)
WBC: 6.3 10*3/uL (ref 4.0–10.5)
WBC: 6.6 10*3/uL (ref 4.0–10.5)
nRBC: 0 % (ref 0.0–0.2)
nRBC: 0 % (ref 0.0–0.2)

## 2023-01-20 LAB — BASIC METABOLIC PANEL
Anion gap: 14 (ref 5–15)
BUN: 91 mg/dL — ABNORMAL HIGH (ref 8–23)
CO2: 23 mmol/L (ref 22–32)
Calcium: 8 mg/dL — ABNORMAL LOW (ref 8.9–10.3)
Chloride: 101 mmol/L (ref 98–111)
Creatinine, Ser: 3.8 mg/dL — ABNORMAL HIGH (ref 0.61–1.24)
GFR, Estimated: 17 mL/min — ABNORMAL LOW (ref >60)
Glucose, Bld: 119 mg/dL — ABNORMAL HIGH (ref 70–99)
Potassium: 3.6 mmol/L (ref 3.5–5.1)
Sodium: 138 mmol/L (ref 135–145)

## 2023-01-20 LAB — GLUCOSE, CAPILLARY
Glucose-Capillary: 107 mg/dL — ABNORMAL HIGH (ref 70–99)
Glucose-Capillary: 129 mg/dL — ABNORMAL HIGH (ref 70–99)

## 2023-01-20 LAB — PHOSPHORUS: Phosphorus: 3.3 mg/dL (ref 2.5–4.6)

## 2023-01-20 LAB — MAGNESIUM: Magnesium: 1.6 mg/dL — ABNORMAL LOW (ref 1.7–2.4)

## 2023-01-20 MED ORDER — MAGNESIUM SULFATE 2 GM/50ML IV SOLN
2.0000 g | Freq: Once | INTRAVENOUS | Status: AC
  Administered 2023-01-20: 2 g via INTRAVENOUS
  Filled 2023-01-20: qty 50

## 2023-01-20 NOTE — Progress Notes (Addendum)
[facility] PROGRESS NOTE    Progress Note  [name] [name]  IHK:[id number] DOB: [birth year] DOA: 12/20/2022 PCP: [name], MD     Brief Narrative:   [name] [name] is an 67 y.o. 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  Abdominal pain continued reported weight loss and poor oral intake.  Admitted on 9/11 with concerns of superior mesenteric artery thrombosis was started on IV heparin infusion vascular surgery was consulted, CT angiogram of abdomen and pelvis was done on 12/21/2022 that showed acute occlusion of the distal SMA as well as the inferior mesenteric artery aching to the OR emergently and was found to have a necrotic jejunum status post resection.  SMA stent was placed, patient left with an open wound and remained on mechanical ventilation postoperatively.  Had to be taken back to the OR several times as there was a concern of bowel integrity 12/25/2022 there were perforations so further surgical resection was done.  She was also changed to argatroban post op.  Had to be dialyzed started on 12/27/2022, subsequently extubated on 12/31/2022.  Argatroban was held on 01/07/2023 due to rectal bleeding, transfused 2 units of packed red blood cells due to drop in hemoglobin of 6.1.,  Transferred to [facility] on 01/09/2023.  Transferred back to the ICU on 01/09/2023 due to rectal bleeding anticoagulation was held. GI was consulted on 01/10/2019 for endoscopy showed no signs of bleeding and colonoscopy deep ulceration encompassing 80% of the bowel observed with stigmata of recent bleeding. Started back on HD on 01/14/2023. He will need some kind of rehab whether inpatient rehab or skilled nursing facility placement.  Significant  Events: 9/11 admitted with abdominal pain 9/12 CT demonstrating mesenteric occlusion, bowel resection and stent placed, to ICU on vent. Left in discontinuity  9/14 back to OR. Left in discontinuity as still concern about bowel integrity 9/16 back to OR  s sig areas of ischemia w/ 2 areas of perforation, another 66cm section of jejunum was resected and area of discontinuity was reanastomosed. Changed to argatroban post-op as unable to get heparin levels detectable  9/17 dropping RASS goal to 0 to -1, assessing for readiness to wean, serum creatinine and BUN still climbing some but making urine 9/18 iHD  9/20 rising pressor need, anemia: CTA abd: nothing emergent, transfused 9/22 extubated 9/25 poor tolerance of NGT clamping. Abx ended  9/26 trying clamping again. Adding PRN antihypertensives. Temp incr a bit, 100.6. holding off on HD and tunneled HD cath given incr UOP  9/26 transferred out of ICU 9/29 Argatroban stopped and held due to rectal bleeding 9/30 Hgb 6.1 at 1250, 2u PRBC ordered then had large bloody BM again overnight, repeat H/H pending. Was hypotensive initially to 70-80s but responded to 200cc IVF. Likely needs more blood, TRH to follow.  9/30 PCCM asked to see incase has ongoing bleeding and hypotension and needs ICU care again 10/1 ICU transfer for GI bleed. AC held.  10/2 endoscopy > no bleedin source identified 10/2 colonoscopy > deep ulcerations encompassing 80% on the observed bowel with stigmata of recent bleeding. 10/6-- HD restarted Eventual plan to rehab at an inpatient facility or skilled nursing facility.  Consultants:  Vascular GI Nephrology PCCM  Assessment/Plan:   Acute Superior mesenteric artery stenosis [facility]) Per vascular surgery the majority of  his bowel surviving after a 3 mm stent opening. Vascular surgery will sign off. Continue daily wet-to-dry changes to the midline abdominal wound per general surgery. AC anticoagulation on hold, given ongoing  anemic issues and risk of rebleeding outweigh the benefits in this specific circumstance, vascular surgery agrees to hold on anticoagulation indefinitely. Appreciate general surgery management wound and nutrition per general surgery Patient has a poor prognosis.  Postoperative GI bleed/acute blood loss anemia: Colonoscopy on 10-2- 24 showed multiple ulcerations with evidence of bleeding he was transfused 3 units of packed red blood cells, last 1 transfusion on 01/18/2023. Anticoagulation has been discontinued.  As he is not a candidate for anticoagulation due to multiple colonic ulcers at this point in time. Start post 1 unit of packed red blood cells hemoglobin this morning is 8.7  Paroxysmal A-fib with RVR: Continue oral amiodarone. Not a candidate for anticoagulation due to high risk of rebleeding. Now in sinus rhythm.  Essential hypertension/hyperlipidemia: Holding medication, blood pressures trending up.  ATN on chronic kidney disease stage III/B[place]ick acidosis: Likely ischemic due to exploratory laparotomy, IV contrast and hypotension. Nephrology was consulted started on HD on 12/27/2022, eventually switched to CRRT on 12/29/2018 to 01/02/2023. Course was complicated by GI bleed, and restarted on HD on 01/13/2023 after nontunneled catheter placed by IR on 01/12/2023. IR has been consulted he is status post tunneled catheter on 01/16/2023. Last HD on 01/18/2023. Urine output is picking up creatinine is improved today. Further management per nephrology.  For HD Tuesday Thursday and Saturdays. Renal hoping that he could do dialysis sitting down.  For HD on 01/20/2023. Continue out of bed to chair, continue to work with physical therapy.  Physical deconditioning: PT OT evaluated the patient will need rehab. Out of bed to chair narcotics for pain control. CIR was consulted they think patient would not tolerate 3 hours of daily. Patient is working with PT.  Nutrition/severe protein caloric  malnutrition: Has not diet since 01/11/2023.  But it seems like yesterday his appetite picked up. Still on TPN, further management per general surgery. Appetite is picking up has eaten all his food this morning. Hopefully we can take him off TNA soon.  Continue Ensure 3 times daily. May need a core track for tube feeding he is not able to increase and continue to meet his nutritional needs.  Morbid obesity: Noted.   Stage III 3 sacral decubitus ulcer: RN Pressure Injury Documentation: Pressure Injury 01/04/23 Buttocks Right;Upper Stage 3 -  Full thickness tissue loss. Subcutaneous fat may be visible but bone, tendon or muscle are NOT exposed. (Active)  01/04/23 2100  Location: Buttocks  Location Orientation: Right;Upper  Staging: Stage 3 -  Full thickness tissue loss. Subcutaneous fat may be visible but bone, tendon or muscle are NOT exposed.  Wound Description (Comments):   Present on Admission: No  Dressing Type Foam - Lift dressing to assess site every shift 01/19/23 2305    DVT prophylaxis: SCD Family Communication:wife Status is: Inpatient Remains inpatient appropriate because: Acute Mesenteric ischemia    Code Status:     Code Status Orders  (From admission, onward)           Start     Ordered   12/20/22 1618  Full code  Continuous       Question:  By:  Answer:  Consent: discussion documented in EHR   12/20/22 1619           Code Status History     Date Active  Date Inactive Code Status Order ID Comments User Context   04/01/2021 0503 04/06/2021 0246 Full Code [id number]  Eduard Clos, MD ED   06/16/2014 1636 06/17/2014 1017 Full Code [id number]  Sherrie George, MD Inpatient         IV Access:   Peripheral IV   Procedures and diagnostic studies:   No results found.   Medical Consultants:   None.   Subjective:    [name] [name] relates he ate 100% of his food had a good bowel movement this morning.  Objective:    Vitals:    01/19/23 1308 01/19/23 1309 01/19/23 1532 01/20/23 0418  BP:  (!) 161/76 (!) 141/73 (!) 162/77  Pulse:   87 84  Resp:  16 18 16   Temp: 98.3 F (36.8 C)  98.2 F (36.8 C) 98.7 F (37.1 C)  TempSrc: Oral  Oral Oral  SpO2:   96% 94%  Weight:      Height:       SpO2: 94 % O2 Flow Rate (L/min): 1 L/min FiO2 (%): 40 %   Intake/Output Summary (Last 24 hours) at 01/20/2023 0912 Last data filed at 01/20/2023 0300 Gross per 24 hour  Intake 1137.71 ml  Output 3900 ml  Net -2762.29 ml   Filed Weights   01/16/23 1504 01/17/23 0345 01/18/23 0839  Weight: 101.2 kg 99.8 kg 104.4 kg    Exam: General exam: In no acute distress. Respiratory system: Good air movement and clear to auscultation. Cardiovascular system: S1 & S2 heard, RRR. No JVD. Psychiatry: Judgement and insight appear normal. Mood & affect appropriate. Gastrointestinal system: Midline dressing, positive bowel sounds, nontender nondistended Extremities: Trace edema Skin: No rashes, lesions or ulcers   Data Reviewed:    Labs: Basic Metabolic Panel: Recent Labs  Lab 01/14/23 0403 01/15/23 0520 01/18/23 0431 01/19/23 0357 01/20/23 0639  NA 137 140 139 135 138  K 3.9 4.1 4.0 4.0 3.6  CL 100 99 95* 97* 101  CO2 25 28 31 25 23   GLUCOSE 127* 138* 115* 106* 119*  BUN 110* 131* 127* 92* 91*  CREATININE 3.17* 4.13* 5.19* 4.22* 3.80*  CALCIUM 7.7* 8.1* 8.1* 7.9* 8.0*  MG 2.0 2.2 1.9 1.7 1.6*  PHOS 4.3 4.5 3.1 3.3 3.3   GFR Estimated Creatinine Clearance: 22.9 mL/min (A) (by C-G formula based on SCr of 3.8 mg/dL (H)). Liver Function Tests: Recent Labs  Lab 01/14/23 0403 01/15/23 0520 01/18/23 0431  AST  --  24 28  ALT  --  56* 29  ALKPHOS  --  75 67  BILITOT  --  0.4 0.4  PROT  --  4.8* 4.6*  ALBUMIN 1.5* 1.6* 1.5*   No results for input(s): "LIPASE", "AMYLASE" in the last 168 hours. No results for input(s): "AMMONIA" in the last 168 hours. Coagulation profile No results for input(s): "INR", "PROTIME"  in the last 168 hours. COVID-19 Labs  No results for input(s): "DDIMER", "FERRITIN", "LDH", "CRP" in the last 72 hours.  Lab Results  Component Value Date   SARSCOV2NAA NEGATIVE 05/03/2021   SARSCOV2NAA NEGATIVE 04/05/2021   SARSCOV2NAA NEGATIVE 04/01/2021    CBC: Recent Labs  Lab 01/15/23 0520 01/17/23 0607 01/18/23 0431 01/19/23 0357 01/20/23 0639  WBC 5.9 4.5 5.7 6.8 6.3  NEUTROABS  --   --  4.4  --   --   HGB 8.1* 7.4* 7.3* 7.9* 8.7*  HCT 24.1* 22.9* 22.1* 24.0* 26.3*  MCV 90.9 93.1 90.9 87.6 88.0  PLT 172  160 154 145* 145*   Cardiac Enzymes: No results for input(s): "CKTOTAL", "CKMB", "CKMBINDEX", "TROPONINI" in the last 168 hours. BNP (last 3 results) Recent Labs    07/11/22 1451  PROBNP 7.0   CBG: Recent Labs  Lab 01/19/23 1144 01/19/23 1530 01/19/23 2114 01/20/23 0013 01/20/23 0559  GLUCAP 121* 118* 127* 129* 107*   D-Dimer: No results for input(s): "DDIMER" in the last 72 hours. Hgb A1c: No results for input(s): "HGBA1C" in the last 72 hours. Lipid Profile: No results for input(s): "CHOL", "HDL", "LDLCALC", "TRIG", "CHOLHDL", "LDLDIRECT" in the last 72 hours.  Thyroid function studies: No results for input(s): "TSH", "T4TOTAL", "T3FREE", "THYROIDAB" in the last 72 hours.  Invalid input(s): "FREET3" Anemia work up: No results for input(s): "VITAMINB12", "FOLATE", "FERRITIN", "TIBC", "IRON", "RETICCTPCT" in the last 72 hours. Sepsis Labs: Recent Labs  Lab 01/17/23 0607 01/18/23 0431 01/19/23 0357 01/20/23 0639  WBC 4.5 5.7 6.8 6.3   Microbiology No results found for this or any previous visit (from the past 240 hour(s)).   Medications:    (feeding supplement) PROSource Plus  30 mL Oral BID BM   amiodarone  200 mg Oral Daily   Chlorhexidine Gluconate Cloth  6 each Topical Q0600   feeding supplement  1 Container Oral TID BM   heparin sodium (porcine)  1,000 Units Intravenous Once   hydrALAZINE  50 mg Oral TID   multivitamin  1 tablet  Oral QHS   pantoprazole (PROTONIX) IV  40 mg Intravenous Q12H   sodium chloride flush  3 mL Intravenous Q12H   tamsulosin  0.4 mg Oral QPC supper   Continuous Infusions:  sodium chloride Stopped (01/19/23 1818)   albumin human Stopped (01/01/23 1020)    ceFAZolin (ANCEF) IV     magnesium sulfate bolus IVPB     TPN (CLINIMIX) Adult without lytes 82 mL/hr at 01/19/23 2305      LOS: 31 days   Marinda Elk  [facility]  01/20/2023, 9:12 AM       Urine clear to auscultation.

## 2023-01-20 NOTE — Plan of Care (Signed)
  Problem: Clinical Measurements: Goal: Will remain free from infection Outcome: Progressing Goal: Diagnostic test results will improve Outcome: Progressing Goal: Respiratory complications will improve Outcome: Progressing Goal: Cardiovascular complication will be avoided Outcome: Progressing   Problem: Elimination: Goal: Will not experience complications related to bowel motility Outcome: Progressing Goal: Will not experience complications related to urinary retention Outcome: Progressing   Problem: Pain Managment: Goal: General experience of comfort will improve Outcome: Progressing   Problem: Safety: Goal: Ability to remain free from injury will improve Outcome: Progressing   Problem: Skin Integrity: Goal: Risk for impaired skin integrity will decrease Outcome: Progressing   Problem: Activity: Goal: Ability to tolerate increased activity will improve Outcome: Progressing   Problem: Respiratory: Goal: Ability to maintain a clear airway and adequate ventilation will improve Outcome: Progressing   Problem: Role Relationship: Goal: Method of communication will improve Outcome: Progressing   Problem: Education: Goal: Ability to describe self-care measures that may prevent or decrease complications (Diabetes Survival Skills Education) will improve Outcome: Progressing   Problem: Coping: Goal: Ability to adjust to condition or change in health will improve Outcome: Progressing   Problem: Fluid Volume: Goal: Ability to maintain a balanced intake and output will improve Outcome: Progressing   Problem: Metabolic: Goal: Ability to maintain appropriate glucose levels will improve Outcome: Progressing   Problem: Nutritional: Goal: Progress toward achieving an optimal weight will improve Outcome: Progressing   Problem: Skin Integrity: Goal: Risk for impaired skin integrity will decrease Outcome: Progressing   Problem: Tissue Perfusion: Goal: Adequacy of tissue  perfusion will improve Outcome: Progressing

## 2023-01-20 NOTE — Procedures (Signed)
Patient seen and examined on Hemodialysis. The procedure was supervised and I have made appropriate changes. BP 127/81   Pulse 89   Temp 98.2 F (36.8 C)   Resp 18   Ht 5\' 11"  (1.803 m)   Wt 98.9 kg   SpO2 97%   BMI 30.41 kg/m   QB 400 mL/ min via TDC, UF goal 3L  Tolerating treatment without complaints at this time.   Bufford Buttner MD [facility] [phone] 2:05 PM

## 2023-01-20 NOTE — Progress Notes (Signed)
PHARMACY - TOTAL PARENTERAL NUTRITION CONSULT NOTE  Indication:  SMA syndrome w associated weight loss  Patient Measurements: Height: 5\' 11"  (180.3 cm) Weight: 104.4 kg (230 lb 2.6 oz) IBW/kg (Calculated) : 75.3 TPN AdjBW (KG): 80.4 Body mass index is 32.1 kg/m. Usual Weight: 95 kg, has lost 50 lbs recently   Assessment:  67 yo M admitted with abdominal pain, weight loss, and poor PO intake. 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 He is now s/p SMA stent placement. He was initially left is discontinuity with open abdomen and was closed on 9/16. TPN was initiated 9/14.   Pt evaluated by speech therapy 9/29 - was able to tolerate trials of ice chips, thin liquid, and puree w/o overt signs or aspiration or difficulty swallowing.  MBS completed 9/30 - cognitive based oral dysphagia; recommended for trial of full liquids and upgrade as mentation allows. Requires full assistance for feeding until mentation improves.   10/9 switching to Clinimix 8/10 due to IVF shortage caused by [facility] per hospital policy. Will not be able to meet full nutrition goals due to limited stock and fluid restrictions. Will replace electrolytes and give lipids outside of Clinimix.   Glucose / Insulin: A1c 6.3% - CBGs < 140; off insulin Electrolytes: K 3.6, Mg down 1.6 (s/p 1g IV x1), Phos 3.3, CO2 23, Cl 101, CoCa 10 Renal: off CRRT 9/24, iHD TTS, last HD 10/10. BUN 92 Hepatic: alk phos/AST/tbili wnl, ALT down 56, albumin 1.5, TG 100  Intake / Output; MIVF: UOP 1.6 mL/kg/hr, LBM 10/9 per charting (rectal bleed resolved). Next HD 10/12 GI Imaging:  9/20 CTA: improved ostial stenosis post stenting, thrombus/occlusion of SMA/ileocolic artery improving, surgical site has increased dilatation, L lung nodule GI Surgeries / Procedures:  9/14 re-ex lap, left in discontinuity with plans for re-exploration  9/16 re-ex lap, SBR with anastomosis x2,  abdominal closure 10/2 EGD: gastric erythematous mucosa, no active bleeding  10/2 Colo: Deep and severe mucosal ulceration in the rectum and in the recto-sigmoid colon, likely ischemic or infectious, not actively bleeding; Exam aborted in the descending colon 10/8 Tentative conversion to tunneled catheter   Central access: CVC [facility] 12/23/22 TPN start date: 12/23/22  Nutritional Goals: Goal concentrated TPN rate is 80 mL/hr (provides 140 g AA and 2400 kCal per day) Goal un-concentrated TPN is 100 mL/hr (provides 140g AA and 2253 kCal per day)  RD Estimated Needs Total Energy Estimated Needs: 2300-2500 kcals Total Protein Estimated Needs: 140 -150 g Total Fluid Estimated Needs: >/= 2.3 L  Current Nutrition:  TPN 10/2 CLD  10/3: (starting calorie count) regular diet, tolerating but not eating much 10/4-10/6: calorie count completed by RD, eating about 10-20% of calorie goals per RD note. Continue TPN. 10/7-10/8: NPO for procedure, calorie count ongoing 10/8: Regular diet 10/9: Regular diet, diet of ham sandwich and magic milkshake, per calorie count ate 41% of kcal and 22% of protein goals 10/10: Regular diet, per calorie count ate 23% of kcal and 13% of protein goals (mashed potatoes, pot roast, 1/4 double cheese burger, 1/4 milkshake)  Plan:  Per previous discussion w/ surgery team, continue current TPN bag today and stop TPN after complete.  Will replete Mag 2g IV x1 outside the bag today Discontinue associated TPN labs and orders, further labs per medical team  Thank you for allowing pharmacy to be a part of this patient's care.  Rexford Maus, PharmD, BCPS 01/20/2023  8:03 AM

## 2023-01-20 NOTE — Progress Notes (Signed)
[facility] Progress Note   Assessment/ Plan:   Acute kidney injury on CKD IIIb: Baseline creatinine level around 2.0.  Acute kidney injury likely ischemic ATN due to ex lap surgery/IV contrast/hypotension  Started on HD 9/18 due to inability to wean from vent and worsening renal function.  He was then switched to CRRT on 9/20-24. Course complicated by GI bleed.  Started on HD on 10/5 after nontunneled catheter placement with IR on 10/4 Reconsulted IR for a tunneled catheter- placed 10/8, appreciate assistance HD rolled over to today d/t high census on MWF- will keep TTS for now, next HD Sat Agree with not intervening on right RAS given that his right kidney is already much smaller than anticipated, discussed with VVS at that time. Likely not much function being contributed from right.   Strict ins and outs and daily weights Note that if contrast is clinically indicated urgently or emergently would then proceed with contrast  Acute mesenteric ischemia: Status post small bowel resection and superior mesenteric artery stent placement.  Followed by vascular surgery.  S/p OR again 9/16 for exlap and further small bowel resection, abd closed Metabolic acidosis - resolved Vent -dependent respiratory failure: Extubated and on room air and monitoring volume and resp status closely given large amount of inputs required.  Anemia due to acute blood loss: transfuse prn for hgb <7.  PRBC's per primary team. Multiple transfusions on 10/1.  S/p EGD and colonoscopy on 10/2. Getting PRBC's on 10/6.  Acute GI bleed - s/p PRBC's and GI is following.  S/p EGD and colonoscopy as above on 10/2 with severe mucosal ulceration in the rectum and rectosigmoid colon (felt ischemic or infectious per GI) Severe protein malnutrition - nutrition per primary team, on TPN for now, weaning  Subjective:    Seen on dialysis.  Going to wean TPN- tolerating a better diet. Excellent   Objective:   BP 127/81   Pulse 89    Temp 98.2 F (36.8 C)   Resp 18   Ht 5\' 11"  (1.803 m)   Wt 98.9 kg   SpO2 97%   BMI 30.41 kg/m   Physical Exam: Gen:NAD, lying flat in bed CVS: RRR Resp: clear Abd: soft Ext: no LE edema ACCESS: tunneled internal jugular cathter R side   Labs: BMET Recent Labs  Lab 01/14/23 0403 01/15/23 0520 01/18/23 0431 01/19/23 0357 01/20/23 0639  NA 137 140 139 135 138  K 3.9 4.1 4.0 4.0 3.6  CL 100 99 95* 97* 101  CO2 25 28 31 25 23   GLUCOSE 127* 138* 115* 106* 119*  BUN 110* 131* 127* 92* 91*  CREATININE 3.17* 4.13* 5.19* 4.22* 3.80*  CALCIUM 7.7* 8.1* 8.1* 7.9* 8.0*  PHOS 4.3 4.5 3.1 3.3 3.3   CBC Recent Labs  Lab 01/17/23 0607 01/18/23 0431 01/19/23 0357 01/20/23 0639  WBC 4.5 5.7 6.8 6.3  NEUTROABS  --  4.4  --   --   HGB 7.4* 7.3* 7.9* 8.7*  HCT 22.9* 22.1* 24.0* 26.3*  MCV 93.1 90.9 87.6 88.0  PLT 160 154 145* 145*      Medications:     (feeding supplement) PROSource Plus  30 mL Oral BID BM   amiodarone  200 mg Oral Daily   Chlorhexidine Gluconate Cloth  6 each Topical Q0600   feeding supplement  1 Container Oral TID BM   heparin sodium (porcine)  1,000 Units Intravenous Once   hydrALAZINE  50 mg Oral TID   multivitamin  1 tablet Oral QHS   pantoprazole (PROTONIX) IV  40 mg Intravenous Q12H   sodium chloride flush  3 mL Intravenous Q12H   tamsulosin  0.4 mg Oral QPC supper     Bufford Buttner, MD 01/20/2023, 2:04 PM

## 2023-01-20 NOTE — Progress Notes (Addendum)
  Patient reported he has frequent diarrhea which is interrupting with his sleep..  Per chart review patient has been admitted for acute superior mesenteric artery stenosis. -Given patient does not have any abdominal pain, afebrile no leukocytosis low concern for C. difficile at this time - Order flexible fecal tube placement and management.  Tereasa Coop, MD [facility] 01/20/2023, 8:49 PM

## 2023-01-20 NOTE — Progress Notes (Signed)
HD Progress Note:   01/20/23 1600  Vitals  Temp 98 F (36.7 C)  Temp Source Oral  BP (!) 143/79  MAP (mmHg) 99  BP Location Left Arm  BP Method Automatic  Patient Position (if appropriate) Lying  Pulse Rate 84  Pulse Rate Source Monitor  ECG Heart Rate 85  Oxygen Therapy  SpO2 98 %  O2 Device Room Air  During Treatment Monitoring  HD Safety Checks Performed Yes  Intra-Hemodialysis Comments Tx completed;Tolerated well  Dialysis Fluid Bolus Normal Saline  Bolus Amount (mL) 300 mL  Post Treatment  Dialyzer Clearance Heavily streaked  Hemodialysis Intake (mL) 0 mL  Liters Processed 70.1  Fluid Removed (mL) 2500 mL  Tolerated HD Treatment Yes  Post-Hemodialysis Comments Patient stable, but had 1 episode of clotting on his set up; was unable to return his blood - noted by Nephrologist.  AVG/AVF Arterial Site Held (minutes) 0 minutes  AVG/AVF Venous Site Held (minutes) 0 minutes  Hemodialysis Catheter Right Internal jugular Double lumen Permanent (Tunneled)  Placement Date/Time: 01/16/23 0851   Serial / Lot #: [id number]  Expiration Date: 07/09/27  Time Out: Correct patient;Correct site;Correct procedure  Maximum sterile barrier precautions: Hand hygiene;Large sterile sheet;Cap;Sterile probe cover;Mask;Ste...  Site Condition No complications  Blue Lumen Status Flushed;Heparin locked;Dead end cap in place  Red Lumen Status Flushed;Heparin locked;Blood return noted  Purple Lumen Status N/A  Catheter fill solution Heparin 1000 units/ml  Catheter fill volume (Arterial) 1.6 cc  Catheter fill volume (Venous) 1.6  Dressing Type Transparent  Dressing Status Antimicrobial disc in place  Interventions Dressing reinforced  Drainage Description None  Dressing Change Due 01/23/23  Post treatment catheter status Capped and Clamped

## 2023-01-21 DIAGNOSIS — N170 Acute kidney failure with tubular necrosis: Secondary | ICD-10-CM | POA: Diagnosis not present

## 2023-01-21 DIAGNOSIS — K551 Chronic vascular disorders of intestine: Secondary | ICD-10-CM | POA: Diagnosis not present

## 2023-01-21 DIAGNOSIS — E43 Unspecified severe protein-calorie malnutrition: Secondary | ICD-10-CM | POA: Diagnosis not present

## 2023-01-21 DIAGNOSIS — N1831 Chronic kidney disease, stage 3a: Secondary | ICD-10-CM | POA: Diagnosis not present

## 2023-01-21 LAB — CBC
HCT: 23.5 % — ABNORMAL LOW (ref 39.0–52.0)
Hemoglobin: 7.6 g/dL — ABNORMAL LOW (ref 13.0–17.0)
MCH: 28.6 pg (ref 26.0–34.0)
MCHC: 32.3 g/dL (ref 30.0–36.0)
MCV: 88.3 fL (ref 80.0–100.0)
Platelets: 145 10*3/uL — ABNORMAL LOW (ref 150–400)
RBC: 2.66 MIL/uL — ABNORMAL LOW (ref 4.22–5.81)
RDW: 14.6 % (ref 11.5–15.5)
WBC: 6.4 10*3/uL (ref 4.0–10.5)
nRBC: 0 % (ref 0.0–0.2)

## 2023-01-21 LAB — BASIC METABOLIC PANEL
Anion gap: 10 (ref 5–15)
BUN: 42 mg/dL — ABNORMAL HIGH (ref 8–23)
CO2: 27 mmol/L (ref 22–32)
Calcium: 7.5 mg/dL — ABNORMAL LOW (ref 8.9–10.3)
Chloride: 99 mmol/L (ref 98–111)
Creatinine, Ser: 2.19 mg/dL — ABNORMAL HIGH (ref 0.61–1.24)
GFR, Estimated: 32 mL/min — ABNORMAL LOW (ref >60)
Glucose, Bld: 118 mg/dL — ABNORMAL HIGH (ref 70–99)
Potassium: 3.2 mmol/L — ABNORMAL LOW (ref 3.5–5.1)
Sodium: 136 mmol/L (ref 135–145)

## 2023-01-21 LAB — GLUCOSE, CAPILLARY
Glucose-Capillary: 107 mg/dL — ABNORMAL HIGH (ref 70–99)
Glucose-Capillary: 112 mg/dL — ABNORMAL HIGH (ref 70–99)
Glucose-Capillary: 114 mg/dL — ABNORMAL HIGH (ref 70–99)
Glucose-Capillary: 124 mg/dL — ABNORMAL HIGH (ref 70–99)
Glucose-Capillary: 125 mg/dL — ABNORMAL HIGH (ref 70–99)
Glucose-Capillary: 88 mg/dL (ref 70–99)

## 2023-01-21 LAB — PHOSPHORUS: Phosphorus: 2.8 mg/dL (ref 2.5–4.6)

## 2023-01-21 LAB — MAGNESIUM: Magnesium: 1.6 mg/dL — ABNORMAL LOW (ref 1.7–2.4)

## 2023-01-21 MED ORDER — POTASSIUM CHLORIDE 20 MEQ PO PACK
40.0000 meq | PACK | Freq: Two times a day (BID) | ORAL | Status: AC
  Administered 2023-01-21 (×2): 40 meq via ORAL
  Filled 2023-01-21 (×2): qty 2

## 2023-01-21 MED ORDER — MAGNESIUM SULFATE 4 GM/100ML IV SOLN
4.0000 g | Freq: Once | INTRAVENOUS | Status: AC
  Administered 2023-01-21: 4 g via INTRAVENOUS
  Filled 2023-01-21: qty 100

## 2023-01-21 NOTE — Plan of Care (Signed)
Problem: Clinical Measurements: Goal: Ability to maintain clinical measurements within normal limits will improve Outcome: Progressing Goal: Will remain free from infection Outcome: Progressing Goal: Diagnostic test results will improve Outcome: Progressing   Problem: Activity: Goal: Risk for activity intolerance will decrease Outcome: Progressing   Problem: Nutrition: Goal: Adequate nutrition will be maintained Outcome: Progressing   Problem: Elimination: Goal: Will not experience complications related to bowel motility Outcome: Progressing   Problem: Pain Managment: Goal: General experience of comfort will improve Outcome: Progressing

## 2023-01-21 NOTE — Progress Notes (Signed)
[facility] Progress Note   Assessment/ Plan:   Acute kidney injury on CKD IIIb: Baseline creatinine level around 2.0.  Acute kidney injury likely ischemic ATN due to ex lap surgery/IV contrast/hypotension  Started on HD 9/18 due to inability to wean from vent and worsening renal function.  He was then switched to CRRT on 9/20-24. Course complicated by GI bleed.  Started on HD on 10/5 after nontunneled catheter placement with IR on 10/4 Reconsulted IR for a tunneled catheter- placed 10/8, appreciate assistance HD rolled over to today d/t high census on MWF- will keep TTS for now Agree with not intervening on right RAS given that his right kidney is already much smaller than anticipated, discussed with VVS at that time. Likely not much function being contributed from right.   Strict ins and outs and daily weights Note that if contrast is clinically indicated urgently or emergently would then proceed with contrast  Weaning TPN- this is great.  Cr is downtrending, BUN is lower.  Hopefully we are seeing some signs of recovery.  Will continue to monitor, urine output is increasing so hopeful that BUN/Cr will follow Acute mesenteric ischemia: Status post small bowel resection and superior mesenteric artery stent placement.  Followed by vascular surgery.  S/p OR again 9/16 for exlap and further small bowel resection, abd closed Metabolic acidosis - resolved Vent -dependent respiratory failure: Extubated and on room air and monitoring volume and resp status closely given large amount of inputs required.  Anemia due to acute blood loss: transfuse prn for hgb <7.  PRBC's per primary team. Multiple transfusions on 10/1.  S/p EGD and colonoscopy on 10/2. Getting PRBC's on 10/6.  Acute GI bleed - s/p PRBC's and GI is following.  S/p EGD and colonoscopy as above on 10/2 with severe mucosal ulceration in the rectum and rectosigmoid colon (felt ischemic or infectious per GI) Severe protein  malnutrition - nutrition per primary team, on TPN for now, weaning  Subjective:    Seen on dialysis.  Going to wean TPN- tolerating a better diet. Excellent   Objective:   BP (!) 145/75 (BP Location: Right Arm)   Pulse 85   Temp 98.6 F (37 C) (Oral)   Resp 20   Ht 5\' 11"  (1.803 m)   Wt 95.7 kg   SpO2 97%   BMI 29.43 kg/m   Physical Exam: Gen:NAD, lying flat in bed CVS: RRR Resp: clear Abd: soft Ext: no LE edema ACCESS: tunneled internal jugular cathter R side   Labs: BMET Recent Labs  Lab 01/15/23 0520 01/18/23 0431 01/19/23 0357 01/20/23 0639 01/21/23 0500  NA 140 139 135 138 136  K 4.1 4.0 4.0 3.6 3.2*  CL 99 95* 97* 101 99  CO2 28 31 25 23 27   GLUCOSE 138* 115* 106* 119* 118*  BUN 131* 127* 92* 91* 42*  CREATININE 4.13* 5.19* 4.22* 3.80* 2.19*  CALCIUM 8.1* 8.1* 7.9* 8.0* 7.5*  PHOS 4.5 3.1 3.3 3.3 2.8   CBC Recent Labs  Lab 01/18/23 0431 01/19/23 0357 01/20/23 0639 01/20/23 1659 01/21/23 0500  WBC 5.7 6.8 6.3 6.6 6.4  NEUTROABS 4.4  --   --   --   --   HGB 7.3* 7.9* 8.7* 8.6* 7.6*  HCT 22.1* 24.0* 26.3* 25.9* 23.5*  MCV 90.9 87.6 88.0 87.5 88.3  PLT 154 145* 145* 147* 145*      Medications:     (feeding supplement) PROSource Plus  30 mL Oral BID BM  amiodarone  200 mg Oral Daily   Chlorhexidine Gluconate Cloth  6 each Topical Q0600   feeding supplement  1 Container Oral TID BM   hydrALAZINE  50 mg Oral TID   multivitamin  1 tablet Oral QHS   pantoprazole (PROTONIX) IV  40 mg Intravenous Q12H   potassium chloride  40 mEq Oral BID   sodium chloride flush  3 mL Intravenous Q12H   tamsulosin  0.4 mg Oral QPC supper     Bufford Buttner, MD 01/21/2023, 11:52 AM

## 2023-01-21 NOTE — Progress Notes (Signed)
[facility] PROGRESS NOTE    Progress Note  [name] [name]  ZOX:[id number] DOB: [birth year] DOA: 12/20/2022 PCP: [name], MD     Brief Narrative:   [name] [name] is an 67 y.o. 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  Abdominal pain continued reported weight loss and poor oral intake.  Admitted on 9/11 with concerns of superior mesenteric artery thrombosis was started on IV heparin infusion vascular surgery was consulted, CT angiogram of abdomen and pelvis was done on 12/21/2022 that showed acute occlusion of the distal SMA as well as the inferior mesenteric artery aching to the OR emergently and was found to have a necrotic jejunum status post resection.  SMA stent was placed, patient left with an open wound and remained on mechanical ventilation postoperatively.  Had to be taken back to the OR several times as there was a concern of bowel integrity 12/25/2022 there were perforations so further surgical resection was done.  She was also changed to argatroban post op.  Had to be dialyzed started on 12/27/2022, subsequently extubated on 12/31/2022.  Argatroban was held on 01/07/2023 due to rectal bleeding, transfused 2 units of packed red blood cells due to drop in hemoglobin of 6.1.,  Transferred to [facility] on 01/09/2023.  Transferred back to the ICU on 01/09/2023 due to rectal bleeding anticoagulation was held. GI was consulted on 01/10/2019 for endoscopy showed no signs of bleeding and colonoscopy deep ulceration encompassing 80% of the bowel observed with stigmata of recent bleeding. Started back on HD on 01/14/2023. He will need some kind of rehab whether inpatient rehab or skilled nursing facility placement.  Significant  Events: 9/11 admitted with abdominal pain 9/12 CT demonstrating mesenteric occlusion, bowel resection and stent placed, to ICU on vent. Left in discontinuity  9/14 back to OR. Left in discontinuity as still concern about bowel integrity 9/16 back to OR  s sig areas of ischemia w/ 2 areas of perforation, another 66cm section of jejunum was resected and area of discontinuity was reanastomosed. Changed to argatroban post-op as unable to get heparin levels detectable  9/17 dropping RASS goal to 0 to -1, assessing for readiness to wean, serum creatinine and BUN still climbing some but making urine 9/18 iHD  9/20 rising pressor need, anemia: CTA abd: nothing emergent, transfused 9/22 extubated 9/25 poor tolerance of NGT clamping. Abx ended  9/26 trying clamping again. Adding PRN antihypertensives. Temp incr a bit, 100.6. holding off on HD and tunneled HD cath given incr UOP  9/26 transferred out of ICU 9/29 Argatroban stopped and held due to rectal bleeding 9/30 Hgb 6.1 at 1250, 2u PRBC ordered then had large bloody BM again overnight, repeat H/H pending. Was hypotensive initially to 70-80s but responded to 200cc IVF. Likely needs more blood, TRH to follow.  9/30 PCCM asked to see incase has ongoing bleeding and hypotension and needs ICU care again 10/1 ICU transfer for GI bleed. AC held.  10/2 endoscopy > no bleedin source identified 10/2 colonoscopy > deep ulcerations encompassing 80% on the observed bowel with stigmata of recent bleeding. 10/6-- HD restarted Eventual plan to rehab at an inpatient facility or skilled nursing facility.  Consultants:  Vascular GI Nephrology PCCM  Assessment/Plan:   Acute Superior mesenteric artery stenosis [facility]) Per vascular surgery the majority of  his bowel surviving after a 3 mm stent opening. Vascular surgery will sign off. Continue daily wet-to-dry changes to the midline abdominal wound per general surgery. AC anticoagulation on hold, given ongoing  anemic issues and risk of rebleeding outweigh the benefits in this specific circumstance, vascular surgery agrees to hold on anticoagulation indefinitely. Appreciate general surgery management wound and nutrition per general surgery Patient has a poor prognosis.  Postoperative GI bleed/acute blood loss anemia: Colonoscopy showed multiple ulcerations with evidence of bleeding he was transfused 3 units of packed red blood cells, last 1 transfusion on 01/18/2023. Anticoagulation has been discontinued.  As he is not a candidate for anticoagulation due to multiple colonic ulcers at this point in time. Hemoglobin continues to drift down this morning 7.6. Will consult GI to see if he will benefit from colonoscopy.  Paroxysmal A-fib with RVR: Continue oral amiodarone. Not a candidate for anticoagulation due to high risk of rebleeding. Now in sinus rhythm.  Essential hypertension/hyperlipidemia: Holding medication, blood pressures trending up.  ATN on chronic kidney disease stage III/[place] acidosis: Likely ischemic due to exploratory laparotomy, IV contrast and hypotension. Nephrology was consulted started on HD on 12/27/2022, eventually switched to CRRT on 12/29/2018 to 01/02/2023. Restarted on HD on 01/13/2023 after nontunneled catheter placed by IR on 01/12/2023. IR has been consulted he is status post tunneled catheter on 01/16/2023. Last HD on 01/20/2023. Urine output is picking up, creatinine continues to improve. Further management per nephrology.  For HD Tuesday Thursday and Saturdays. Renal hoping that he could do dialysis sitting down.  Continue out of bed to chair, continue to work with physical therapy.  Physical deconditioning: PT OT evaluated the patient will need rehab. Out of bed to chair narcotics for pain control. CIR was consulted they think patient would not tolerate 3 hours of daily. Patient continues to work with physical therapy.  Nutrition/severe protein caloric  malnutrition: Has not diet since 01/11/2023.  But it seems like yesterday his appetite picked up. Now off TNA, he is eating about 50% or more of his food tolerating his Ensure. Seems like his appetite is picking up. TNF has been discontinued. Continue Ensure 3 times daily.  Morbid obesity: Noted.   Stage III 3 sacral decubitus ulcer: RN Pressure Injury Documentation: Pressure Injury 01/04/23 Buttocks Right;Upper Stage 3 -  Full thickness tissue loss. Subcutaneous fat may be visible but bone, tendon or muscle are NOT exposed. (Active)  01/04/23 2100  Location: Buttocks  Location Orientation: Right;Upper  Staging: Stage 3 -  Full thickness tissue loss. Subcutaneous fat may be visible but bone, tendon or muscle are NOT exposed.  Wound Description (Comments):   Present on Admission: No  Dressing Type Foam - Lift dressing to assess site every shift 01/20/23 2000    DVT prophylaxis: SCD Family Communication:wife Status is: Inpatient Remains inpatient appropriate because: Acute Mesenteric ischemia    Code Status:     Code Status Orders  (From admission, onward)           Start     Ordered   12/20/22 1618  Full code  Continuous       Question:  By:  Answer:  Consent: discussion documented in EHR   12/20/22 1619           Code Status History     Date Active Date Inactive Code Status Order ID Comments User Context   04/01/2021 0503 04/06/2021 0246 Full Code [id number]  Eduard Clos, MD ED   06/16/2014 1636 06/17/2014  1017 Full Code [id number]  Sherrie George, MD Inpatient         IV Access:   Peripheral IV   Procedures and diagnostic studies:   No results found.   Medical Consultants:   None.   Subjective:    [name] [name] related to his appetite is improving tolerating his diet.  Objective:    Vitals:   01/20/23 1927 01/21/23 0116 01/21/23 0239 01/21/23 0500  BP: (!) 145/75     Pulse: 86 80 85   Resp: 18 17 20    Temp: 98.6 F (37 C)      TempSrc: Oral     SpO2: 96% 95% 97%   Weight:    95.7 kg  Height:       SpO2: 97 % O2 Flow Rate (L/min): 1 L/min FiO2 (%): 40 %   Intake/Output Summary (Last 24 hours) at 01/21/2023 0846 Last data filed at 01/20/2023 1927 Gross per 24 hour  Intake 1380 ml  Output 4000 ml  Net -2620 ml   Filed Weights   01/18/23 0839 01/20/23 1137 01/21/23 0500  Weight: 104.4 kg 98.9 kg 95.7 kg    Exam: General exam: In no acute distress. Respiratory system: Good air movement and clear to auscultation. Cardiovascular system: S1 & S2 heard, RRR. No JVD. Gastrointestinal system: Abdomen is nondistended, soft and nontender.  Extremities: No pedal edema. Skin: No rashes, lesions or ulcers Psychiatry: Judgement and insight appear normal. Mood & affect appropriate. Data Reviewed:    Labs: Basic Metabolic Panel: Recent Labs  Lab 01/15/23 0520 01/18/23 0431 01/19/23 0357 01/20/23 0639 01/21/23 0500  NA 140 139 135 138 136  K 4.1 4.0 4.0 3.6 3.2*  CL 99 95* 97* 101 99  CO2 28 31 25 23 27   GLUCOSE 138* 115* 106* 119* 118*  BUN 131* 127* 92* 91* 42*  CREATININE 4.13* 5.19* 4.22* 3.80* 2.19*  CALCIUM 8.1* 8.1* 7.9* 8.0* 7.5*  MG 2.2 1.9 1.7 1.6* 1.6*  PHOS 4.5 3.1 3.3 3.3 2.8   GFR Estimated Creatinine Clearance: 38.1 mL/min (A) (by C-G formula based on SCr of 2.19 mg/dL (H)). Liver Function Tests: Recent Labs  Lab 01/15/23 0520 01/18/23 0431  AST 24 28  ALT 56* 29  ALKPHOS 75 67  BILITOT 0.4 0.4  PROT 4.8* 4.6*  ALBUMIN 1.6* 1.5*   No results for input(s): "LIPASE", "AMYLASE" in the last 168 hours. No results for input(s): "AMMONIA" in the last 168 hours. Coagulation profile No results for input(s): "INR", "PROTIME" in the last 168 hours. COVID-19 Labs  No results for input(s): "DDIMER", "FERRITIN", "LDH", "CRP" in the last 72 hours.  Lab Results  Component Value Date   SARSCOV2NAA NEGATIVE 05/03/2021   SARSCOV2NAA NEGATIVE 04/05/2021   SARSCOV2NAA NEGATIVE  04/01/2021    CBC: Recent Labs  Lab 01/18/23 0431 01/19/23 0357 01/20/23 0639 01/20/23 1659 01/21/23 0500  WBC 5.7 6.8 6.3 6.6 6.4  NEUTROABS 4.4  --   --   --   --   HGB 7.3* 7.9* 8.7* 8.6* 7.6*  HCT 22.1* 24.0* 26.3* 25.9* 23.5*  MCV 90.9 87.6 88.0 87.5 88.3  PLT 154 145* 145* 147* 145*   Cardiac Enzymes: No results for input(s): "CKTOTAL", "CKMB", "CKMBINDEX", "TROPONINI" in the last 168 hours. BNP (last 3 results) Recent Labs    07/11/22 1451  PROBNP 7.0   CBG: Recent Labs  Lab 01/19/23 2114 01/20/23 0013 01/20/23 0559 01/20/23 2331 01/21/23 0408  GLUCAP 127* 129* 107* 88 107*  D-Dimer: No results for input(s): "DDIMER" in the last 72 hours. Hgb A1c: No results for input(s): "HGBA1C" in the last 72 hours. Lipid Profile: No results for input(s): "CHOL", "HDL", "LDLCALC", "TRIG", "CHOLHDL", "LDLDIRECT" in the last 72 hours.  Thyroid function studies: No results for input(s): "TSH", "T4TOTAL", "T3FREE", "THYROIDAB" in the last 72 hours.  Invalid input(s): "FREET3" Anemia work up: No results for input(s): "VITAMINB12", "FOLATE", "FERRITIN", "TIBC", "IRON", "RETICCTPCT" in the last 72 hours. Sepsis Labs: Recent Labs  Lab 01/19/23 0357 01/20/23 0639 01/20/23 1659 01/21/23 0500  WBC 6.8 6.3 6.6 6.4   Microbiology No results found for this or any previous visit (from the past 240 hour(s)).   Medications:    (feeding supplement) PROSource Plus  30 mL Oral BID BM   amiodarone  200 mg Oral Daily   Chlorhexidine Gluconate Cloth  6 each Topical Q0600   feeding supplement  1 Container Oral TID BM   hydrALAZINE  50 mg Oral TID   multivitamin  1 tablet Oral QHS   pantoprazole (PROTONIX) IV  40 mg Intravenous Q12H   sodium chloride flush  3 mL Intravenous Q12H   tamsulosin  0.4 mg Oral QPC supper   Continuous Infusions:  albumin human Stopped (01/01/23 1020)    ceFAZolin (ANCEF) IV        LOS: 32 days   Marinda Elk  [facility]  01/21/2023, 8:46 AM       Urine clear to auscultation.

## 2023-01-21 NOTE — Plan of Care (Signed)
  Problem: Clinical Measurements: Goal: Ability to maintain clinical measurements within normal limits will improve Outcome: Progressing Goal: Diagnostic test results will improve Outcome: Progressing Goal: Cardiovascular complication will be avoided Outcome: Progressing   Problem: Activity: Goal: Risk for activity intolerance will decrease Outcome: Progressing   Problem: Nutrition: Goal: Adequate nutrition will be maintained Outcome: Progressing   Problem: Elimination: Goal: Will not experience complications related to bowel motility Outcome: Progressing   Problem: Pain Managment: Goal: General experience of comfort will improve Outcome: Progressing

## 2023-01-22 DIAGNOSIS — K551 Chronic vascular disorders of intestine: Secondary | ICD-10-CM | POA: Diagnosis not present

## 2023-01-22 LAB — BASIC METABOLIC PANEL
Anion gap: 14 (ref 5–15)
BUN: 39 mg/dL — ABNORMAL HIGH (ref 8–23)
CO2: 25 mmol/L (ref 22–32)
Calcium: 7.7 mg/dL — ABNORMAL LOW (ref 8.9–10.3)
Chloride: 100 mmol/L (ref 98–111)
Creatinine, Ser: 2.55 mg/dL — ABNORMAL HIGH (ref 0.61–1.24)
GFR, Estimated: 27 mL/min — ABNORMAL LOW (ref >60)
Glucose, Bld: 96 mg/dL (ref 70–99)
Potassium: 3.5 mmol/L (ref 3.5–5.1)
Sodium: 139 mmol/L (ref 135–145)

## 2023-01-22 LAB — CBC
HCT: 23.2 % — ABNORMAL LOW (ref 39.0–52.0)
Hemoglobin: 7.4 g/dL — ABNORMAL LOW (ref 13.0–17.0)
MCH: 28.9 pg (ref 26.0–34.0)
MCHC: 31.9 g/dL (ref 30.0–36.0)
MCV: 90.6 fL (ref 80.0–100.0)
Platelets: 140 10*3/uL — ABNORMAL LOW (ref 150–400)
RBC: 2.56 MIL/uL — ABNORMAL LOW (ref 4.22–5.81)
RDW: 14.6 % (ref 11.5–15.5)
WBC: 6.3 10*3/uL (ref 4.0–10.5)
nRBC: 0 % (ref 0.0–0.2)

## 2023-01-22 LAB — GLUCOSE, CAPILLARY
Glucose-Capillary: 98 mg/dL (ref 70–99)
Glucose-Capillary: 92 mg/dL (ref 70–99)
Glucose-Capillary: 102 mg/dL — ABNORMAL HIGH (ref 70–99)
Glucose-Capillary: 95 mg/dL (ref 70–99)

## 2023-01-22 LAB — PREPARE RBC (CROSSMATCH)

## 2023-01-22 MED ORDER — SODIUM CHLORIDE 0.9% IV SOLUTION: Freq: Once | INTRAVENOUS | Status: AC

## 2023-01-22 MED ORDER — GERHARDT'S BUTT CREAM
TOPICAL_CREAM | Freq: Two times a day (BID) | CUTANEOUS | Status: DC
  Filled 2023-01-22: qty 1

## 2023-01-22 NOTE — Progress Notes (Addendum)
Patient ID: [name] [name], male   DOB: [birth year], 67 y.o.   MRN: [id number] [facility] Surgery Progress Note  12 Days Post-Op  Subjective: CC Wife at bedside. [name] [name] reports no abdominal pain. Tolerating reg diet and finished all of dinner last night + breakfast this am. BM yesterday that was non-bloody.   Afebrile. No tachycardia or hypotension. WBC wnl. Hgb 8.6 > 7.6 > 7.4. Last blood transfusion was 1U on 10/10. Scheduled for 2U today. Primary reports [name] [name] called GI to discuss repeat colonoscopy and they are deferring at this time. Foley w/ good uop  Objective: Vital signs in last 24 hours: Temp:  [97.7 F (36.5 C)-98.3 F (36.8 C)] 97.7 F (36.5 C) (10/14 0759) Pulse Rate:  [82-96] 96 (10/14 0759) Resp:  [14-19] 18 (10/14 0759) BP: (150-165)/(69-80) 155/71 (10/14 0759) SpO2:  [94 %-99 %] 94 % (10/14 0759) Last BM Date : 01/21/23  Intake/Output from previous day: 10/13 0701 - 10/14 0700 In: -  Out: 2187 [Urine:2185; Stool:2] Intake/Output this shift: No intake/output data recorded.  PE: Gen: alert, NAD Pulm: respirations unlabored  Abd: soft, nondistended, nontender, wound is clean and superior aspect well healed, inferior with beefy red granulation tissue present.  Lab Results:  Recent Labs    01/21/23 0500 01/22/23 0629  WBC 6.4 6.3  HGB 7.6* 7.4*  HCT 23.5* 23.2*  PLT 145* 140*   BMET Recent Labs    01/21/23 0500 01/22/23 0629  NA 136 139  K 3.2* 3.5  CL 99 100  CO2 27 25  GLUCOSE 118* 96  BUN 42* 39*  CREATININE 2.19* 2.55*  CALCIUM 7.5* 7.7*   PT/INR No results for input(s): "LABPROT", "INR" in the last 72 hours. CMP     Component Value Date/Time   NA 139 01/22/2023 0629   K 3.5 01/22/2023 0629   CL 100 01/22/2023 0629   CO2 25 01/22/2023 0629   GLUCOSE 96 01/22/2023 0629   BUN 39 (H) 01/22/2023 0629   CREATININE 2.55 (H) 01/22/2023 0629   CALCIUM 7.7 (L) 01/22/2023 0629   PROT 4.6 (L) 01/18/2023 0431   PROT 6.3 03/16/2020 0800    ALBUMIN 1.5 (L) 01/18/2023 0431   ALBUMIN 4.3 03/16/2020 0800   AST 28 01/18/2023 0431   ALT 29 01/18/2023 0431   ALKPHOS 67 01/18/2023 0431   BILITOT 0.4 01/18/2023 0431   BILITOT 0.8 03/16/2020 0800   GFRNONAA 27 (L) 01/22/2023 0629   GFRAA >60 04/14/2015 1127   Lipase     Component Value Date/Time   LIPASE 28 11/27/2022 0755       Studies/Results: No results found.  Anti-infectives: Anti-infectives (From admission, onward)    Start     Dose/Rate Route Frequency Ordered Stop   01/16/23 0820  ceFAZolin (ANCEF) IVPB 2g/100 mL premix        over 30 Minutes Intravenous Continuous PRN 01/16/23 0845 01/16/23 0855   01/15/23 1000  ceFAZolin (ANCEF) IVPB 2g/100 mL premix       Note to Pharmacy: Give in IR for surgical prophylaxis   2 g 200 mL/hr over 30 Minutes Intravenous  Once 01/14/23 1818     12/30/22 1800  vancomycin (VANCOCIN) IVPB 1000 mg/200 mL premix        1,000 mg 200 mL/hr over 60 Minutes Intravenous Every 24 hours 12/29/22 1844 01/03/23 1946   12/30/22 0600  vancomycin (VANCOCIN) IVPB 1000 mg/200 mL premix  Status:  Discontinued        1,000 mg 200 mL/hr  over 60 Minutes Intravenous Every 12 hours 12/29/22 1842 12/29/22 1844   12/29/22 2200  meropenem (MERREM) 1 g in sodium chloride 0.9 % 100 mL IVPB  Status:  Discontinued        1 g 200 mL/hr over 30 Minutes Intravenous Every 12 hours 12/29/22 1705 12/29/22 1842   12/29/22 1930  meropenem (MERREM) 1 g in sodium chloride 0.9 % 100 mL IVPB        1 g 200 mL/hr over 30 Minutes Intravenous Every 8 hours 12/29/22 1842 01/04/23 0700   12/29/22 1800  vancomycin (VANCOREADY) IVPB 2000 mg/400 mL        2,000 mg 200 mL/hr over 120 Minutes Intravenous  Once 12/29/22 1705 12/29/22 1958   12/23/22 1230  piperacillin-tazobactam (ZOSYN) IVPB 2.25 g        2.25 g 100 mL/hr over 30 Minutes Intravenous Every 8 hours 12/23/22 1139 12/28/22 2109   12/22/22 0000  piperacillin-tazobactam (ZOSYN) IVPB 3.375 g  Status:  Discontinued         3.375 g 12.5 mL/hr over 240 Minutes Intravenous Every 8 hours 12/21/22 2331 12/23/22 1139        Assessment/Plan 67 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 Karin Lieu, exploratory laparotomy and small bowel resection Dr. Janee Morn -S/p re-exploration, replace abthera FB 12/23/22   -S/p re-exploration, small bowel resection with anastomosis x2, assessment of bowel perfusion with ICG, abdominal closure 9/16 Dr. Andrey Campanile - BRBPR s/p egd/colonoscopy 10/2 that revealed severe ulceration in the rectum and recto- sigmoid colon, no active bleeding at the time. Biopsy showed ulceration with inflammatory changes consistent with ischemia. Immunohistochemical stains for CMV, HSV I and HSV II were performed - CMV stain is negative for cytomegaloviral inclusions. HSV I and HSV II do not show evidence of Herpes viral inclusions.  - No further bloody bm's. Hgb still slowly drifting down at Hgb 8.6 > 7.6 > 7.4. Getting 2U PRBC today. As started in GI's last note, if [name] [name] does have rebleeding, they could consider repeat flexible sigmoidoscopy with Hemospray and/or PuraStat gel application. Not many other options for this patient. - Tolerating reg diet + shakes. Off TPN.  - Given ongoing anemic issues would hold off on anticoagulation indefinitely since the risk of bleeding outweigh the benefits in the circumstance. - Daily wet to dry dressing changes to midline abdominal wound - We will follow along peripherally moving forward. Please call back with any acute changes or questions.   FEN: Reg, protein shakes VTE: all on hold indefinitely due to bleeding risk ID: No current abx Foley: per primary    LOS: 33 days    Jacinto Halim, [facility] Surgery 01/22/2023, 9:06 AM Please see Amion for pager number during day hours 7:00am-4:30pm

## 2023-01-22 NOTE — Progress Notes (Signed)
[facility] PROGRESS NOTE    Progress Note  [name] [name]  VOZ:[id number] DOB: 04-18-1955 DOA: 12/20/2022 PCP: [name], MD     Brief Narrative:   [name] [name] is an 67 y.o. 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  Abdominal pain continued reported weight loss and poor oral intake.  Admitted on 9/11 with concerns of superior mesenteric artery thrombosis was started on IV heparin infusion vascular surgery was consulted, CT angiogram of abdomen and pelvis was done on 12/21/2022 that showed acute occlusion of the distal SMA as well as the inferior mesenteric artery aching to the OR emergently and was found to have a necrotic jejunum status post resection.  SMA stent was placed, patient left with an open wound and remained on mechanical ventilation postoperatively.  Had to be taken back to the OR several times as there was a concern of bowel integrity 12/25/2022 there were perforations so further surgical resection was done.  She was also changed to argatroban post op.  Had to be dialyzed started on 12/27/2022, subsequently extubated on 12/31/2022.  Argatroban was held on 01/07/2023 due to rectal bleeding, transfused 2 units of packed red blood cells due to drop in hemoglobin of 6.1.,  Transferred to [facility] on 01/09/2023.  Transferred back to the ICU on 01/09/2023 due to rectal bleeding anticoagulation was held. GI was consulted on 01/10/2019 for endoscopy showed no signs of bleeding and colonoscopy deep ulceration encompassing 80% of the bowel observed with stigmata of recent bleeding. Started back on HD on 01/14/2023. He will need some kind of rehab whether inpatient rehab or skilled nursing facility placement.  Significant  Events: 9/11 admitted with abdominal pain 9/12 CT demonstrating mesenteric occlusion, bowel resection and stent placed, to ICU on vent. Left in discontinuity  9/14 back to OR. Left in discontinuity as still concern about bowel integrity 9/16 back to OR  s sig areas of ischemia w/ 2 areas of perforation, another 66cm section of jejunum was resected and area of discontinuity was reanastomosed. Changed to argatroban post-op as unable to get heparin levels detectable  9/17 dropping RASS goal to 0 to -1, assessing for readiness to wean, serum creatinine and BUN still climbing some but making urine 9/18 iHD  9/20 rising pressor need, anemia: CTA abd: nothing emergent, transfused 9/22 extubated 9/25 poor tolerance of NGT clamping. Abx ended  9/26 trying clamping again. Adding PRN antihypertensives. Temp incr a bit, 100.6. holding off on HD and tunneled HD cath given incr UOP  9/26 transferred out of ICU 9/29 Argatroban stopped and held due to rectal bleeding 9/30 Hgb 6.1 at 1250, 2u PRBC ordered then had large bloody BM again overnight, repeat H/H pending. Was hypotensive initially to 70-80s but responded to 200cc IVF. Likely needs more blood, TRH to follow.  9/30 PCCM asked to see incase has ongoing bleeding and hypotension and needs ICU care again 10/1 ICU transfer for GI bleed. AC held.  10/2 endoscopy > no bleedin source identified 10/2 colonoscopy > deep ulcerations encompassing 80% on the observed bowel with stigmata of recent bleeding. 10/6-- HD restarted Eventual plan to rehab at an inpatient facility or skilled nursing facility.  Consultants:  Vascular GI Nephrology PCCM  Assessment/Plan:   Acute Superior mesenteric artery stenosis [facility]) Per vascular surgery the majority of  his bowel surviving after a 3 mm stent opening. Vascular surgery will sign off. Continue daily wet-to-dry changes to the midline abdominal wound per general surgery. AC anticoagulation on hold, given ongoing  anemic issues and risk of rebleeding outweigh the benefits in this specific circumstance, vascular surgery agrees to hold on anticoagulation indefinitely. Appreciate general surgery management wound and nutrition per general surgery Patient has a poor prognosis.  Postoperative GI bleed/acute blood loss anemia: Colonoscopy showed multiple ulcerations with evidence of bleeding he was transfused 3 units of packed red blood cells, last 1 transfusion on 01/18/2023. Anticoagulation has been discontinued.  As he is not a candidate for anticoagulation due to multiple colonic ulcers at this point in time. Hemoglobin continues to drift down we will give him 2 units of packed red blood cells this morning.  Check a CBC post transfusional. GI was curb sided recommended no further intervention for multiple colonic ulcers.  Paroxysmal A-fib with RVR: Continue oral amiodarone. Not a candidate for anticoagulation due to high risk of rebleeding. Now in sinus rhythm.  Essential hypertension/hyperlipidemia: Holding medication, blood pressures trending up.  ATN on chronic kidney disease stage III/[place] acidosis: Likely ischemic due to exploratory laparotomy, IV contrast and hypotension. Nephrology was consulted started on HD on 12/27/2022, eventually switched to CRRT on 12/29/2018 to 01/02/2023. Restarted on HD on 01/13/2023 after nontunneled catheter placed by IR on 01/12/2023. IR has been consulted he is status post tunneled catheter on 01/16/2023. Last HD on 01/20/2023. Urine output is picking up has been putting anywhere from 4 to 2 L of urine a day. Creatinine is up today. Further management per nephrology.  For HD Tuesday Thursday and Saturdays. Renal hoping that he could do dialysis sitting down.  Continue out of bed to chair, continue to work with physical therapy.  Physical deconditioning: PT/OT evaluated the patient will need rehab. Out of bed to chair narcotics for pain control. CIR was consulted  they think patient would not tolerate 3 hours of daily. Patient continues to work with physical therapy.  Nutrition/severe protein caloric malnutrition: Now off TNA, he is eating about 50% or more of his food tolerating his Ensure. Appetite has really picked up. Continue Ensure 3 times daily.  Morbid obesity: Noted.   Stage III 3 sacral decubitus ulcer: RN Pressure Injury Documentation: Pressure Injury 01/04/23 Buttocks Right;Upper Stage 3 -  Full thickness tissue loss. Subcutaneous fat may be visible but bone, tendon or muscle are NOT exposed. (Active)  01/04/23 2100  Location: Buttocks  Location Orientation: Right;Upper  Staging: Stage 3 -  Full thickness tissue loss. Subcutaneous fat may be visible but bone, tendon or muscle are NOT exposed.  Wound Description (Comments):   Present on Admission: No  Dressing Type Foam - Lift dressing to assess site every shift 01/21/23 2000    DVT prophylaxis: SCD Family Communication:wife Status is: Inpatient Remains inpatient appropriate because: Acute Mesenteric ischemia    Code Status:     Code Status Orders  (From admission, onward)           Start     Ordered   12/20/22 1618  Full code  Continuous       Question:  By:  Answer:  Consent: discussion documented in EHR   12/20/22 1619           Code Status History     Date Active Date Inactive Code Status Order ID Comments User Context   04/01/2021 0503 04/06/2021 0246 Full Code [id number]  Midge Minium  Dorris Carnes, MD ED   06/16/2014 1636 06/17/2014 1017 Full Code [id number]  Sherrie George, MD Inpatient         IV Access:   Peripheral IV   Procedures and diagnostic studies:   No results found.   Medical Consultants:   None.   Subjective:    [name] [name] in a good mood this morning had a large breakfast.  Objective:    Vitals:   01/21/23 1954 01/21/23 2321 01/22/23 0423 01/22/23 0759  BP: (!) 165/72 (!) 159/75 (!) 163/80   Pulse: 82 83 85   Resp:  19 15 18    Temp: 98.3 F (36.8 C) 98.2 F (36.8 C) 97.9 F (36.6 C)   TempSrc: Oral Oral Oral Oral  SpO2: 97% 98% 98%   Weight:      Height:       SpO2: 98 % O2 Flow Rate (L/min): 1 L/min FiO2 (%): 40 %   Intake/Output Summary (Last 24 hours) at 01/22/2023 0829 Last data filed at 01/22/2023 0865 Gross per 24 hour  Intake --  Output 2187 ml  Net -2187 ml   Filed Weights   01/18/23 0839 01/20/23 1137 01/21/23 0500  Weight: 104.4 kg 98.9 kg 95.7 kg    Exam: General exam: In no acute distress. Respiratory system: Good air movement and clear to auscultation. Cardiovascular system: S1 & S2 heard, RRR. No JVD. Gastrointestinal system: Abdomen is nondistended, soft and nontender.  Extremities: No pedal edema. Skin: No rashes, lesions or ulcers Psychiatry: Judgement and insight appear normal. Mood & affect appropriate. Data Reviewed:    Labs: Basic Metabolic Panel: Recent Labs  Lab 01/18/23 0431 01/19/23 0357 01/20/23 0639 01/21/23 0500 01/22/23 0629  NA 139 135 138 136 139  K 4.0 4.0 3.6 3.2* 3.5  CL 95* 97* 101 99 100  CO2 31 25 23 27 25   GLUCOSE 115* 106* 119* 118* 96  BUN 127* 92* 91* 42* 39*  CREATININE 5.19* 4.22* 3.80* 2.19* 2.55*  CALCIUM 8.1* 7.9* 8.0* 7.5* 7.7*  MG 1.9 1.7 1.6* 1.6*  --   PHOS 3.1 3.3 3.3 2.8  --    GFR Estimated Creatinine Clearance: 32.7 mL/min (A) (by C-G formula based on SCr of 2.55 mg/dL (H)). Liver Function Tests: Recent Labs  Lab 01/18/23 0431  AST 28  ALT 29  ALKPHOS 67  BILITOT 0.4  PROT 4.6*  ALBUMIN 1.5*   No results for input(s): "LIPASE", "AMYLASE" in the last 168 hours. No results for input(s): "AMMONIA" in the last 168 hours. Coagulation profile No results for input(s): "INR", "PROTIME" in the last 168 hours. COVID-19 Labs  No results for input(s): "DDIMER", "FERRITIN", "LDH", "CRP" in the last 72 hours.  Lab Results  Component Value Date   SARSCOV2NAA NEGATIVE 05/03/2021   SARSCOV2NAA NEGATIVE  04/05/2021   SARSCOV2NAA NEGATIVE 04/01/2021    CBC: Recent Labs  Lab 01/18/23 0431 01/19/23 0357 01/20/23 0639 01/20/23 1659 01/21/23 0500 01/22/23 0629  WBC 5.7 6.8 6.3 6.6 6.4 6.3  NEUTROABS 4.4  --   --   --   --   --   HGB 7.3* 7.9* 8.7* 8.6* 7.6* 7.4*  HCT 22.1* 24.0* 26.3* 25.9* 23.5* 23.2*  MCV 90.9 87.6 88.0 87.5 88.3 90.6  PLT 154 145* 145* 147* 145* 140*   Cardiac Enzymes: No results for input(s): "CKTOTAL", "CKMB", "CKMBINDEX", "TROPONINI" in the last 168 hours. BNP (last 3 results) Recent Labs    07/11/22 1451  PROBNP 7.0   CBG: Recent  Labs  Lab 01/21/23 1312 01/21/23 1758 01/21/23 2119 01/21/23 2325 01/22/23 0537  GLUCAP 125* 124* 112* 114* 98   D-Dimer: No results for input(s): "DDIMER" in the last 72 hours. Hgb A1c: No results for input(s): "HGBA1C" in the last 72 hours. Lipid Profile: No results for input(s): "CHOL", "HDL", "LDLCALC", "TRIG", "CHOLHDL", "LDLDIRECT" in the last 72 hours.  Thyroid function studies: No results for input(s): "TSH", "T4TOTAL", "T3FREE", "THYROIDAB" in the last 72 hours.  Invalid input(s): "FREET3" Anemia work up: No results for input(s): "VITAMINB12", "FOLATE", "FERRITIN", "TIBC", "IRON", "RETICCTPCT" in the last 72 hours. Sepsis Labs: Recent Labs  Lab 01/20/23 0639 01/20/23 1659 01/21/23 0500 01/22/23 0629  WBC 6.3 6.6 6.4 6.3   Microbiology No results found for this or any previous visit (from the past 240 hour(s)).   Medications:    (feeding supplement) PROSource Plus  30 mL Oral BID BM   amiodarone  200 mg Oral Daily   Chlorhexidine Gluconate Cloth  6 each Topical Q0600   feeding supplement  1 Container Oral TID BM   hydrALAZINE  50 mg Oral TID   multivitamin  1 tablet Oral QHS   pantoprazole (PROTONIX) IV  40 mg Intravenous Q12H   sodium chloride flush  3 mL Intravenous Q12H   tamsulosin  0.4 mg Oral QPC supper   Continuous Infusions:  albumin human Stopped (01/01/23 1020)    ceFAZolin  (ANCEF) IV        LOS: 33 days   [name] [name]  [facility]  01/22/2023, 8:29 AM       Urine clear to auscultation.

## 2023-01-22 NOTE — Progress Notes (Signed)
[facility] Progress Note   Assessment/ Plan:   Acute kidney injury on CKD IIIb: Baseline creatinine level around 2.0.  Acute kidney injury likely ischemic ATN due to ex lap surgery/IV contrast/hypotension  Started on HD 9/18 due to inability to wean from vent and worsening renal function.  He was then switched to CRRT on 9/20-24. Course complicated by GI bleed.  Started on HD on 10/5 after nontunneled catheter placement with IR on 10/4 Reconsulted IR for a tunneled catheter- placed 10/8, appreciate assistance Agree with not intervening on right RAS given that his right kidney is already much smaller than anticipated, discussed with VVS at that time. Likely not much function being contributed from right.   Strict ins and outs and daily weights Note that if contrast is clinically indicated urgently or emergently would then proceed with contrast  Creatinine minimally increased today but BUN lower.  Good urine output.  Will hold dialysis tomorrow and continue to monitor his progress. Acute mesenteric ischemia: Status post small bowel resection and superior mesenteric artery stent placement.  Followed by vascular surgery.  S/p OR again 9/16 for exlap and further small bowel resection, abd closed Vent -dependent respiratory failure: Extubated and on room air and monitoring volume and resp status closely given large amount of inputs required.  Anemia due to acute blood loss: transfuse prn for hgb <7.  PRBC's per primary team. Multiple transfusions on 10/1.  S/p EGD and colonoscopy on 10/2.  Transfusions intermittently Acute GI bleed - s/p PRBC's and GI is following.  S/p EGD and colonoscopy as above on 10/2 with severe mucosal ulceration in the rectum and rectosigmoid colon (felt ischemic or infectious per GI) Severe protein malnutrition - nutrition per primary team, on TPN for now, weaning  Subjective:    Patient feels well today with no complaints.   Objective:   BP (!) 158/78    Pulse 96   Temp 97.7 F (36.5 C) (Oral)   Resp 18   Ht 5\' 11"  (1.803 m)   Wt 95.7 kg   SpO2 94%   BMI 29.43 kg/m   Physical Exam: Gen:NAD, lying flat in bed CVS: normal rate Resp: Bilateral chest rise with no increased work of breathing Abd: soft, nondistended Ext: no LE edema ACCESS: tunneled internal jugular cathter R side   Labs: BMET Recent Labs  Lab 01/18/23 0431 01/19/23 0357 01/20/23 0639 01/21/23 0500 01/22/23 0629  NA 139 135 138 136 139  K 4.0 4.0 3.6 3.2* 3.5  CL 95* 97* 101 99 100  CO2 31 25 23 27 25   GLUCOSE 115* 106* 119* 118* 96  BUN 127* 92* 91* 42* 39*  CREATININE 5.19* 4.22* 3.80* 2.19* 2.55*  CALCIUM 8.1* 7.9* 8.0* 7.5* 7.7*  PHOS 3.1 3.3 3.3 2.8  --    CBC Recent Labs  Lab 01/18/23 0431 01/19/23 0357 01/20/23 0639 01/20/23 1659 01/21/23 0500 01/22/23 0629  WBC 5.7   < > 6.3 6.6 6.4 6.3  NEUTROABS 4.4  --   --   --   --   --   HGB 7.3*   < > 8.7* 8.6* 7.6* 7.4*  HCT 22.1*   < > 26.3* 25.9* 23.5* 23.2*  MCV 90.9   < > 88.0 87.5 88.3 90.6  PLT 154   < > 145* 147* 145* 140*   < > = values in this interval not displayed.      Medications:     (feeding supplement) PROSource Plus  30 mL Oral  BID BM   sodium chloride   Intravenous Once   amiodarone  200 mg Oral Daily   Chlorhexidine Gluconate Cloth  6 each Topical Q0600   feeding supplement  1 Container Oral TID BM   Gerhardt's butt cream   Topical BID   hydrALAZINE  50 mg Oral TID   multivitamin  1 tablet Oral QHS   pantoprazole (PROTONIX) IV  40 mg Intravenous Q12H   sodium chloride flush  3 mL Intravenous Q12H   tamsulosin  0.4 mg Oral QPC supper

## 2023-01-22 NOTE — Progress Notes (Signed)
OT Cancellation Note  Patient Details Name: [name] [name] MRN: [id number] DOB: 28-Dec-1955   Cancelled Treatment:    Reason Eval/Treat Not Completed: Patient declined, no reason specified.  Patient with loose stool throughout the day, requesting rehab to check back tomorrow.    [name] [name] D Zinnia Tindall 01/22/2023, 2:58 PM 01/22/2023  RP, OTR/L  Acute Rehabilitation Services  Office:  [phone]

## 2023-01-22 NOTE — TOC Progression Note (Signed)
Transition of Care [facility]) - Progression Note    Patient Details  Name: [name] [name] MRN: [id number] Date of Birth: [birth year]  Transition of Care [facility] CM/SW Contact  Graves-Bigelow, Lamar Laundry, RN Phone Number: 01/22/2023, 1:38 PM  Clinical Narrative: Case Manager continues to follow the patient for transition of care needs. Following for CIR vs SNF.   Expected Discharge Plan: Skilled Nursing Facility Barriers to Discharge: Continued Medical Work up  Expected Discharge Plan and Services In-house Referral: NA Discharge Planning Services: CM Consult Post Acute Care Choice: NA Living arrangements for the past 2 months: Single Family Home  Social Determinants of Health (SDOH) Interventions SDOH Screenings   Food Insecurity: No Food Insecurity (12/20/2022)  Housing: Low Risk  (12/20/2022)  Transportation Needs: No Transportation Needs (12/20/2022)  Utilities: Not At Risk (12/20/2022)  Alcohol Screen: Low Risk  (11/20/2022)  Depression (PHQ2-9): Low Risk  (11/20/2022)  Financial Resource Strain: Low Risk  (12/01/2022)  Physical Activity: Sufficiently Active (12/01/2022)  Social Connections: Moderately Integrated (12/01/2022)  Stress: No Stress Concern Present (12/01/2022)  Tobacco Use: Low Risk  (01/10/2023)  Health Literacy: Adequate Health Literacy (11/20/2022)    Readmission Risk Interventions    12/26/2022   11:40 AM  Readmission Risk Prevention Plan  Transportation Screening Complete  HRI or Home Care Consult Complete  Social Work Consult for Recovery Care Planning/Counseling Complete  Palliative Care Screening Not Applicable  Medication Review Oceanographer) Complete

## 2023-01-22 NOTE — Progress Notes (Signed)
   01/22/23 2121  Provider Notification  Provider Name/Title S. Sundil  Date Provider Notified 01/22/23  Time Provider Notified 2118  Method of Notification Page  Notification Reason Other (Comment) (Patient due to void, Bladder scan 410)  Provider response See new orders  Date of Provider Response 01/22/23  Time of Provider Response 2121   Patient was able to successfully void on his own.  Patient with 450 urine output.  In & out was not completed

## 2023-01-22 NOTE — Progress Notes (Signed)
1 unit of PRBC transfusion completed without any issue. Started 2 bag of PRBC transfusion now.  Hinton Dyer, RN

## 2023-01-22 NOTE — Progress Notes (Signed)
  Bedside nurse reporting patient removed Foley catheter in the afternoon.  He has not have any urine output yet.  Bladder scan showing 410 mL urinary tension. - Ordering In-N-Out cath.   Tereasa Coop, MD [facility] 01/22/2023, 9:22 PM

## 2023-01-22 NOTE — Progress Notes (Signed)
In good spirits. No abdominal pain.  Hct continues to drift, treating conservatively.   While he is making progress, I reiterated that a large portion of his bowel is living off of a 3.5 mm stent.  Occlusion would have a high probability of mortality. Unfortunately will need to continue to hold antiplatelet therapy until all bleeding resolves.   Victorino Sparrow MD

## 2023-01-23 DIAGNOSIS — K551 Chronic vascular disorders of intestine: Secondary | ICD-10-CM | POA: Diagnosis not present

## 2023-01-23 DIAGNOSIS — N170 Acute kidney failure with tubular necrosis: Secondary | ICD-10-CM | POA: Diagnosis not present

## 2023-01-23 DIAGNOSIS — E43 Unspecified severe protein-calorie malnutrition: Secondary | ICD-10-CM | POA: Diagnosis not present

## 2023-01-23 DIAGNOSIS — N1831 Chronic kidney disease, stage 3a: Secondary | ICD-10-CM | POA: Diagnosis not present

## 2023-01-23 LAB — TYPE AND SCREEN
ABO/RH(D): A POS
Antibody Screen: NEGATIVE
Unit division: 0
Unit division: 0

## 2023-01-23 LAB — CBC
HCT: 28.3 % — ABNORMAL LOW (ref 39.0–52.0)
Hemoglobin: 9.2 g/dL — ABNORMAL LOW (ref 13.0–17.0)
MCH: 29.5 pg (ref 26.0–34.0)
MCHC: 32.5 g/dL (ref 30.0–36.0)
MCV: 90.7 fL (ref 80.0–100.0)
Platelets: 155 10*3/uL (ref 150–400)
RBC: 3.12 MIL/uL — ABNORMAL LOW (ref 4.22–5.81)
RDW: 14.9 % (ref 11.5–15.5)
WBC: 5.7 10*3/uL (ref 4.0–10.5)
nRBC: 0 % (ref 0.0–0.2)

## 2023-01-23 LAB — BPAM RBC
Blood Product Expiration Date: 202411112359
Blood Product Expiration Date: 202411092359
ISSUE DATE / TIME: 202410141414
ISSUE DATE / TIME: 202410141728
ISSUING PHYSICIAN: 202410141728
Unit Type and Rh: 202411112359
Unit Type and Rh: 6200
Unit Type and Rh: 6200

## 2023-01-23 LAB — BASIC METABOLIC PANEL
Anion gap: 11 (ref 5–15)
BUN: 35 mg/dL — ABNORMAL HIGH (ref 8–23)
CO2: 25 mmol/L (ref 22–32)
Calcium: 7.6 mg/dL — ABNORMAL LOW (ref 8.9–10.3)
Chloride: 101 mmol/L (ref 98–111)
Creatinine, Ser: 2.09 mg/dL — ABNORMAL HIGH (ref 0.61–1.24)
GFR, Estimated: 34 mL/min — ABNORMAL LOW (ref >60)
Glucose, Bld: 91 mg/dL (ref 70–99)
Potassium: 3.1 mmol/L — ABNORMAL LOW (ref 3.5–5.1)
Sodium: 137 mmol/L (ref 135–145)

## 2023-01-23 LAB — GLUCOSE, CAPILLARY: Glucose-Capillary: 96 mg/dL (ref 70–99)

## 2023-01-23 MED ORDER — MEDIHONEY WOUND/BURN DRESSING EX PSTE
1.0000 | PASTE | Freq: Every day | CUTANEOUS | Status: DC
  Administered 2023-01-23 – 2023-01-24 (×2): 1 via TOPICAL
  Filled 2023-01-23: qty 44

## 2023-01-23 MED ORDER — POTASSIUM CHLORIDE CRYS ER 20 MEQ PO TBCR
40.0000 meq | EXTENDED_RELEASE_TABLET | Freq: Once | ORAL | Status: AC
  Administered 2023-01-23: 40 meq via ORAL
  Filled 2023-01-23: qty 2

## 2023-01-23 MED ORDER — BANATROL TF EN LIQD
60.0000 mL | Freq: Two times a day (BID) | ENTERAL | Status: DC
  Administered 2023-01-23: 60 mL via ORAL
  Filled 2023-01-23 (×3): qty 60

## 2023-01-23 MED ORDER — TAMSULOSIN HCL 0.4 MG PO CAPS
0.8000 mg | ORAL_CAPSULE | Freq: Every day | ORAL | Status: DC
  Administered 2023-01-23: 0.8 mg via ORAL
  Filled 2023-01-23: qty 2

## 2023-01-23 MED ORDER — DIPHENOXYLATE-ATROPINE 2.5-0.025 MG PO TABS
1.0000 | ORAL_TABLET | Freq: Four times a day (QID) | ORAL | Status: DC | PRN
  Administered 2023-01-23 – 2023-01-24 (×2): 1 via ORAL
  Filled 2023-01-23 (×2): qty 1

## 2023-01-23 NOTE — Consult Note (Addendum)
WOC Nurse wound follow up Right upper buttock previously noted pressure injury has progressed to Unstageable; .8X1.8X.1cm, 100% tightly adhered slough, mod amt tan drainage.  Dressing procedure/placement/frequency: Topical treatment orders provided for bedside nurses to perform as follows to assist with removal of nonviable tissue: Apply Medihoney to right upper buttock Q day, then cover with foam dressing. Change foam dressing Q 3 days or PRN soiling. WOC team will assess weekly to determine if a change in the plan of care is indicated at that time.  Thank-you,  Cammie Mcgee MSN, RN, CWOCN, [place], CNS [phone]

## 2023-01-23 NOTE — Progress Notes (Signed)
[facility] Progress Note   Assessment/ Plan:   Acute kidney injury on CKD IIIb: Baseline creatinine level around 2.0.  Acute kidney injury likely ischemic ATN due to ex lap surgery/IV contrast/hypotension  Started on HD 9/18 due to inability to wean from vent and worsening renal function.  He was then switched to CRRT on 9/20-24. Course complicated by GI bleed.  Started on HD on 10/5 after nontunneled catheter placement with IR on 10/4 Reconsulted IR for a tunneled catheter- placed 10/8, appreciate assistance Agree with not intervening on right RAS given that his right kidney is already much smaller than anticipated, discussed with VVS at that time. Likely not much function being contributed from right.   Strict ins and outs and daily weights Note that if contrast is clinically indicated urgently or emergently would then proceed with contrast  Creatinine improved and urine output also good.  BUN not increasing.  Hold on dialysis.  Possibly remove dialysis catheter later this week Acute mesenteric ischemia: Status post small bowel resection and superior mesenteric artery stent placement.  Followed by vascular surgery.  S/p OR again 9/16 for exlap and further small bowel resection, abd closed Vent -dependent respiratory failure: Extubated and on room air and monitoring volume and resp status closely given large amount of inputs required.  Anemia due to acute blood loss: transfuse prn for hgb <7.  PRBC's per primary team. Multiple transfusions on 10/1.  S/p EGD and colonoscopy on 10/2.  Transfusions intermittently Acute GI bleed - s/p PRBC's and GI is following.  S/p EGD and colonoscopy as above on 10/2 with severe mucosal ulceration in the rectum and rectosigmoid colon (felt ischemic or infectious per GI) Severe protein malnutrition - nutrition per primary team; improving  Subjective:    Patient feels well today with no complaints.   Objective:   BP (!) 176/85 (BP Location: Left  Arm)   Pulse 78   Temp 98.1 F (36.7 C) (Oral)   Resp 13   Ht 5\' 11"  (1.803 m)   Wt 96.7 kg   SpO2 97%   BMI 29.73 kg/m   Physical Exam: Gen:NAD, lying flat in bed CVS: normal rate Resp: Bilateral chest rise with no increased work of breathing Abd: soft, nondistended Ext: no LE edema ACCESS: tunneled internal jugular cathter R side   Labs: BMET Recent Labs  Lab 01/18/23 0431 01/19/23 0357 01/20/23 0639 01/21/23 0500 01/22/23 0629 01/23/23 0615  NA 139 135 138 136 139 137  K 4.0 4.0 3.6 3.2* 3.5 3.1*  CL 95* 97* 101 99 100 101  CO2 31 25 23 27 25 25   GLUCOSE 115* 106* 119* 118* 96 91  BUN 127* 92* 91* 42* 39* 35*  CREATININE 5.19* 4.22* 3.80* 2.19* 2.55* 2.09*  CALCIUM 8.1* 7.9* 8.0* 7.5* 7.7* 7.6*  PHOS 3.1 3.3 3.3 2.8  --   --    CBC Recent Labs  Lab 01/18/23 0431 01/19/23 0357 01/20/23 1659 01/21/23 0500 01/22/23 0629 01/23/23 0615  WBC 5.7   < > 6.6 6.4 6.3 5.7  NEUTROABS 4.4  --   --   --   --   --   HGB 7.3*   < > 8.6* 7.6* 7.4* 9.2*  HCT 22.1*   < > 25.9* 23.5* 23.2* 28.3*  MCV 90.9   < > 87.5 88.3 90.6 90.7  PLT 154   < > 147* 145* 140* 155   < > = values in this interval not displayed.  Medications:     amiodarone  200 mg Oral Daily   Chlorhexidine Gluconate Cloth  6 each Topical Q0600   fiber supplement (BANATROL TF)  60 mL Oral BID   Gerhardt's butt cream   Topical BID   hydrALAZINE  50 mg Oral TID   leptospermum manuka honey  1 Application Topical Daily   multivitamin  1 tablet Oral QHS   pantoprazole (PROTONIX) IV  40 mg Intravenous Q12H   sodium chloride flush  3 mL Intravenous Q12H   tamsulosin  0.8 mg Oral QPC supper

## 2023-01-23 NOTE — Progress Notes (Signed)
Nutrition Follow-up  DOCUMENTATION CODES:   Severe malnutrition in context of acute illness/injury  INTERVENTION:  Continue regular diet as ordered Double protein portions with meals Banatrol BID-provides 45kcal, 5g soluble fiber and 2g protein per serving. Discontinue ProSource and Boost Breeze d/t dislike of supplements Rena-vit with minerals daily  NUTRITION DIAGNOSIS:   Severe Malnutrition related to acute illness as evidenced by energy intake < or equal to 50% for > or equal to 5 days, percent weight loss. - remains applicable  GOAL:   Patient will meet greater than or equal to 90% of their needs - progressing  MONITOR:   Diet advancement, Labs, Weight trends, Skin, I & O's (TPN)  REASON FOR ASSESSMENT:   Ventilator, Consult New TPN/TNA  ASSESSMENT:   67 yo male admitted with SMA stenosis/thrombosis and SB ischemia requiring SMA thrombectomy and stent followed by ex lap and SB resection. Pt with poor po intake and weight loss for several weeks PTA. 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, CKD 3, GERD, hx testicular cancer, HLD, HTN  9/11 Admitted 9/12 OR: acute on chronic mesenteric ischemia with necrotic area of jejunum requiring SB resection, bowel left in discontinuity, abdomen open with ABTherea wound VAC, SMA thrombectomy and stent 9/14 OR: reopening of recent laparotomy, evaluation of SMA with Doppler including mesentery of small bowel, placement of abthera device  9/16 OR: sig areas of ischemia with 2 areas of perforation, another 66 cm section of jejunum was resected and area of discontinuity was reanastomosed 9/18 iHD 9/19 2nd iHD 9/20 CRRT initiated, GI bleed 9/22 Extubated 9/24 CRRT stopped due to clotting issues overnight, CL diet, NG clamped; failed trial 9/26 NG clamped 9/27 NG removed 9/30 MBS with SLP, started full liquids 10/1 large bloody BM with clots, transferred to ICU, downgraded to  clear liquids 10/2 colonoscopy, EGD: deep, severe mucosal ulceration in the rectum and recto-sigmoid colon (suspected ischemic or infectious) 10/3 SLP follow-up, diet advanced to regular with thin liquids 10/4 calorie count started to help with weaning TPN, nontunneled catheter placed by IR 10/5 resumed HD 10/7 discontinued Ensure Enlive per pt request as he did not like these supplements and started Boost Breeze and PROSource Plus 10/8 tunneled catheter placed by IR 10/9 TPN changed from compounded/individualized to Clinimix due to shortages  10/12 TPN discontinued  Spoke with pt at bedside. He reports that is appetite is doing well and he is trying to prioritize nutrition as best as possible. His family is providing lunches for him. Yesterday he ate a cheeseburger and today he is having a double meat [employer]. He is not consuming protein supplements as he dislikes them and feels that he is eating better. Discontinued these and discussed adding double protein portions to meals.   Meal completions: 10/12: 60% breakfast, 50% lunch 10/14: 100% breakfast, 100% lunch, 50% dinner  Pt reports that he has been experiencing multiple episodes of diarrhea over the last week, about 5-6 times daily. MD added PRN lomotil today. Will also add banatrol to add soluble fiber to aid in stool formation. Banatrol also contains 125mg  potassium per serving which would also be beneficial given losses and noted low potassium lab today.   Last HD 10/12: Net UF 3L Cr minimally increased, BUN lower. Good UOP. Nephrology holding HD today and continues to monitor progress.   UOP: 1.3L x24 hours  Medications: rena-vit, protonix, klor-con  Labs: potassium 3.1, BUN 35, Cr 2.09, GFR 34, CBG's 82-102  x24 hours  Diet Order:   Diet Order             Diet regular Fluid consistency: Thin  Diet effective now                   EDUCATION NEEDS:   Not appropriate for education at this time  Skin:  Skin  Assessment: Skin Integrity Issues: Unstageable: Per WOC: Unstageable; .8X1.8X.1cm, 100% tightly adhered slough, mod amt tan drainage.  Last BM:  10/15 x3; medium/large type 6/7  Height:   Ht Readings from Last 1 Encounters:  12/20/22 5\' 11"  (1.803 m)    Weight:   Wt Readings from Last 1 Encounters:  01/23/23 96.7 kg   BMI:  Body mass index is 29.73 kg/m.  Estimated Nutritional Needs:   Kcal:  2300-2500 kcals  Protein:  140 -150 g  Fluid:  >/= 2.3 L  Drusilla Kanner, RDN, LDN Clinical Nutrition

## 2023-01-23 NOTE — Progress Notes (Signed)
Inpatient Rehab Admissions Coordinator:   CIR following for potential admit. Note refused to work with OT yesterday. I spoke with him and stressed that he must be getting out of bed and participating with therapy in order for CIR to accept him. He states he will attempt to get OOB today.   Megan Salon, MS, CCC-SLP Rehab Admissions Coordinator  [phone] (celll) [phone] (office)

## 2023-01-23 NOTE — Progress Notes (Signed)
[facility] PROGRESS NOTE    Progress Note  [name] [name]  NFA:[id number] DOB: 1955/11/20 DOA: 12/20/2022 PCP: [name], MD     Brief Narrative:   [name] [name] is an 67 y.o. 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  Abdominal pain continued reported weight loss and poor oral intake.  Admitted on 9/11 with concerns of superior mesenteric artery thrombosis was started on IV heparin infusion vascular surgery was consulted, CT angiogram of abdomen and pelvis was done on 12/21/2022 that showed acute occlusion of the distal SMA as well as the inferior mesenteric artery aching to the OR emergently and was found to have a necrotic jejunum status post resection.  SMA stent was placed, patient left with an open wound and remained on mechanical ventilation postoperatively.  Had to be taken back to the OR several times as there was a concern of bowel integrity 12/25/2022 there were perforations so further surgical resection was done.  She was also changed to argatroban post op.  Had to be dialyzed started on 12/27/2022, subsequently extubated on 12/31/2022.  Argatroban was held on 01/07/2023 due to rectal bleeding, transfused 2 units of packed red blood cells due to drop in hemoglobin of 6.1.,  Transferred to [facility] on 01/09/2023.  Transferred back to the ICU on 01/09/2023 due to rectal bleeding anticoagulation was held. GI was consulted on 01/10/2019 for endoscopy showed no signs of bleeding and colonoscopy deep ulceration encompassing 80% of the bowel observed with stigmata of recent bleeding. Started back on HD on 01/14/2023. He will need some kind of rehab whether inpatient rehab or skilled nursing facility placement.  Significant  Events: 9/11 admitted with abdominal pain 9/12 CT demonstrating mesenteric occlusion, bowel resection and stent placed, to ICU on vent. Left in discontinuity  9/14 back to OR. Left in discontinuity as still concern about bowel integrity 9/16 back to OR  s sig areas of ischemia w/ 2 areas of perforation, another 66cm section of jejunum was resected and area of discontinuity was reanastomosed. Changed to argatroban post-op as unable to get heparin levels detectable  9/17 dropping RASS goal to 0 to -1, assessing for readiness to wean, serum creatinine and BUN still climbing some but making urine 9/18 iHD  9/20 rising pressor need, anemia: CTA abd: nothing emergent, transfused 9/22 extubated 9/25 poor tolerance of NGT clamping. Abx ended  9/26 trying clamping again. Adding PRN antihypertensives. Temp incr a bit, 100.6. holding off on HD and tunneled HD cath given incr UOP  9/26 transferred out of ICU 9/29 Argatroban stopped and held due to rectal bleeding 9/30 Hgb 6.1 at 1250, 2u PRBC ordered then had large bloody BM again overnight, repeat H/H pending. Was hypotensive initially to 70-80s but responded to 200cc IVF. Likely needs more blood, TRH to follow.  9/30 PCCM asked to see incase has ongoing bleeding and hypotension and needs ICU care again 10/1 ICU transfer for GI bleed. AC held.  10/2 endoscopy > no bleedin source identified 10/2 colonoscopy > deep ulcerations encompassing 80% on the observed bowel with stigmata of recent bleeding. 10/6-- HD restarted Eventual plan to rehab at an inpatient facility or skilled nursing facility.  Consultants:  Vascular GI Nephrology PCCM  Assessment/Plan:   Acute Superior mesenteric artery stenosis [facility]) Per vascular surgery the majority of  his bowel surviving on 3 mm stent opening.  Vascular surgery has signed off. Continue wet-to-dry changes to the midline.  Per surgery instructions. Not a candidate for anticoagulation as he is high risk of  bleeding.  Vascular surgery also agrees to with holding anticoagulation. General Surgery on board and currently managing nutrition. Patient has a very poor prognosis.  Postoperative GI bleed/acute blood loss anemia: Colonoscopy showed multiple ulcerations with evidence of bleeding he was transfused 3 units of packed red blood cells, last 1 transfusion on 01/18/2023. Anticoagulation has been discontinued.  As he is not a candidate for anticoagulation due to multiple colonic ischemic ulcers at this point in time. His hemoglobin continues to drift down and he status post 2 units of packed red blood cells, his hemoglobin this morning is 9.2. GI was curb sided who recommended no further intervention for multiple colonic ulcers unless he has frank bleeding.  Diarrhea: He is tolerating his diet I have started him on Lomotil there is likely due to ischemic event.  Has remained afebrile with no leukocytosis. Abdominal exam is benign.  Paroxysmal A-fib with RVR: Continue Amio to room, not a candidate for anticoagulation due to high risk of bleeding.  Now in sinus rhythm.  Essential hypertension/hyperlipidemia: Holding medication, blood pressures trending up.  ATN on chronic kidney disease stage III/[place] acidosis: Likely ischemic due to exploratory laparotomy, IV contrast and hypotension. Nephrology was consulted started on HD on 12/27/2022, eventually switched to CRRT on 12/29/2018 to 01/02/2023. Restarted on HD on 01/13/2023 after nontunneled catheter placed by IR on 01/12/2023. IR has been consulted he is status post tunneled catheter on 01/16/2023. Last HD on 01/20/2023. He is having good urine output about 1300 cc or more in a day. Nephrology is on board, his creatinine is improving they are holding on on HD as his creatinine is slowly improving. Further management per nephrology, he was being dialyzed Tuesday Thursdays and Saturdays. Continue out of bed to chair, continue to work with physical  therapy.  Physical deconditioning: PT/OT evaluated the patient will need rehab. Out of bed to chair narcotics for pain control. Patient is working with physical therapy and taking a few steps.  Nutrition/severe protein caloric malnutrition: Initially required TNA as he was not tolerating his diet. Now he is eating more than 50% of his food his wife is bringing food from home and tolerating Ensure 3 times daily.  Morbid obesity: Noted.   Stage III 3 sacral decubitus ulcer: RN Pressure Injury Documentation: Pressure Injury 01/04/23 Buttocks Right;Upper Stage 3 -  Full thickness tissue loss. Subcutaneous fat may be visible but bone, tendon or muscle are NOT exposed. (Active)  01/04/23 2100  Location: Buttocks  Location Orientation: Right;Upper  Staging: Stage 3 -  Full thickness tissue loss. Subcutaneous fat may be visible but bone, tendon or muscle are NOT exposed.  Wound Description (Comments):   Present on Admission: No  Dressing Type Foam - Lift dressing to assess site every shift 01/22/23 2000    DVT prophylaxis: SCD Family Communication:wife Status is: Inpatient Remains inpatient appropriate because: Acute Mesenteric ischemia    Code Status:     Code Status Orders  (From admission, onward)           Start     Ordered   12/20/22 1618  Full code  Continuous       Question:  By:  Answer:  Consent: discussion documented in EHR   12/20/22 1619  Code Status History     Date Active Date Inactive Code Status Order ID Comments User Context   04/01/2021 0503 04/06/2021 0246 Full Code [id number]  Eduard Clos, MD ED   06/16/2014 1636 06/17/2014 1017 Full Code [id number]  Sherrie George, MD Inpatient         IV Access:   Peripheral IV   Procedures and diagnostic studies:   No results found.   Medical Consultants:   None.   Subjective:    [name] [name] tolerated 3 meals yesterday.  Objective:    Vitals:   01/22/23 2144  01/22/23 2323 01/23/23 0538 01/23/23 0757  BP: (!) 178/85 (!) 169/82 (!) 158/83 (!) 176/85  Pulse: 79 80 75 78  Resp: 17 17 16 13   Temp: 98.2 F (36.8 C) 98 F (36.7 C) 97.9 F (36.6 C) 98.1 F (36.7 C)  TempSrc: Oral Oral Oral Oral  SpO2: 92% 93% 97% 97%  Weight:   96.7 kg   Height:       SpO2: 97 % O2 Flow Rate (L/min): 1 L/min FiO2 (%): 40 %   Intake/Output Summary (Last 24 hours) at 01/23/2023 0919 Last data filed at 01/22/2023 2333 Gross per 24 hour  Intake 2221.5 ml  Output 1300 ml  Net 921.5 ml   Filed Weights   01/20/23 1137 01/21/23 0500 01/23/23 0538  Weight: 98.9 kg 95.7 kg 96.7 kg    Exam: General exam: In no acute distress. Respiratory system: Good air movement and clear to auscultation. Cardiovascular system: S1 & S2 heard, RRR. No JVD. Gastrointestinal system: Abdomen is nondistended, soft and nontender.  Extremities: No pedal edema. Skin: No rashes, lesions or ulcers Psychiatry: Judgement and insight appear normal. Mood & affect appropriate. Data Reviewed:    Labs: Basic Metabolic Panel: Recent Labs  Lab 01/18/23 0431 01/19/23 0357 01/20/23 0639 01/21/23 0500 01/22/23 0629 01/23/23 0615  NA 139 135 138 136 139 137  K 4.0 4.0 3.6 3.2* 3.5 3.1*  CL 95* 97* 101 99 100 101  CO2 31 25 23 27 25 25   GLUCOSE 115* 106* 119* 118* 96 91  BUN 127* 92* 91* 42* 39* 35*  CREATININE 5.19* 4.22* 3.80* 2.19* 2.55* 2.09*  CALCIUM 8.1* 7.9* 8.0* 7.5* 7.7* 7.6*  MG 1.9 1.7 1.6* 1.6*  --   --   PHOS 3.1 3.3 3.3 2.8  --   --    GFR Estimated Creatinine Clearance: 40.1 mL/min (A) (by C-G formula based on SCr of 2.09 mg/dL (H)). Liver Function Tests: Recent Labs  Lab 01/18/23 0431  AST 28  ALT 29  ALKPHOS 67  BILITOT 0.4  PROT 4.6*  ALBUMIN 1.5*   No results for input(s): "LIPASE", "AMYLASE" in the last 168 hours. No results for input(s): "AMMONIA" in the last 168 hours. Coagulation profile No results for input(s): "INR", "PROTIME" in the last 168  hours. COVID-19 Labs  No results for input(s): "DDIMER", "FERRITIN", "LDH", "CRP" in the last 72 hours.  Lab Results  Component Value Date   SARSCOV2NAA NEGATIVE 05/03/2021   SARSCOV2NAA NEGATIVE 04/05/2021   SARSCOV2NAA NEGATIVE 04/01/2021    CBC: Recent Labs  Lab 01/18/23 0431 01/19/23 0357 01/20/23 0639 01/20/23 1659 01/21/23 0500 01/22/23 0629 01/23/23 0615  WBC 5.7   < > 6.3 6.6 6.4 6.3 5.7  NEUTROABS 4.4  --   --   --   --   --   --   HGB 7.3*   < > 8.7* 8.6* 7.6* 7.4* 9.2*  HCT 22.1*   < > 26.3* 25.9* 23.5* 23.2* 28.3*  MCV 90.9   < > 88.0 87.5 88.3 90.6 90.7  PLT 154   < > 145* 147* 145* 140* 155   < > = values in this interval not displayed.   Cardiac Enzymes: No results for input(s): "CKTOTAL", "CKMB", "CKMBINDEX", "TROPONINI" in the last 168 hours. BNP (last 3 results) Recent Labs    07/11/22 1451  PROBNP 7.0   CBG: Recent Labs  Lab 01/22/23 0537 01/22/23 1215 01/22/23 1830 01/22/23 2318 01/23/23 0545  GLUCAP 98 92 102* 95 96   D-Dimer: No results for input(s): "DDIMER" in the last 72 hours. Hgb A1c: No results for input(s): "HGBA1C" in the last 72 hours. Lipid Profile: No results for input(s): "CHOL", "HDL", "LDLCALC", "TRIG", "CHOLHDL", "LDLDIRECT" in the last 72 hours.  Thyroid function studies: No results for input(s): "TSH", "T4TOTAL", "T3FREE", "THYROIDAB" in the last 72 hours.  Invalid input(s): "FREET3" Anemia work up: No results for input(s): "VITAMINB12", "FOLATE", "FERRITIN", "TIBC", "IRON", "RETICCTPCT" in the last 72 hours. Sepsis Labs: Recent Labs  Lab 01/20/23 1659 01/21/23 0500 01/22/23 0629 01/23/23 0615  WBC 6.6 6.4 6.3 5.7   Microbiology No results found for this or any previous visit (from the past 240 hour(s)).   Medications:    (feeding supplement) PROSource Plus  30 mL Oral BID BM   amiodarone  200 mg Oral Daily   Chlorhexidine Gluconate Cloth  6 each Topical Q0600   feeding supplement  1 Container Oral  TID BM   Gerhardt's butt cream   Topical BID   hydrALAZINE  50 mg Oral TID   multivitamin  1 tablet Oral QHS   pantoprazole (PROTONIX) IV  40 mg Intravenous Q12H   potassium chloride  40 mEq Oral Once   sodium chloride flush  3 mL Intravenous Q12H   tamsulosin  0.4 mg Oral QPC supper   Continuous Infusions:  albumin human Stopped (01/01/23 1020)    ceFAZolin (ANCEF) IV        LOS: 34 days   Marinda Elk  [facility]  01/23/2023, 9:19 AM       Urine clear to auscultation.

## 2023-01-23 NOTE — Progress Notes (Signed)
Physical Therapy Treatment Patient Details Name: [name] [name] MRN: [id number] DOB: 02-Jan-1956 Today's Date: 01/23/2023   History of Present Illness Pt is a 67 y/o male admitted 9/11 with several weeks of abdominal pain along with a 30 pound weight loss. Work up included mesenteric ischemia and necrotic area of jejunum. S/p Exploratory laparotomy and small bowel resection 9/12, reopening of the laparotomy 9/14, second re-opening of laparotomy and more small bowel resection with anastomosis x2 9/16. 9/18 iHD, Extubated 9/22; 9/26 trans out of ICU; 9/29 rectal bleeding began 9/30-10/1 hypotensive and return to ICU with ongoing bleeding; 10/2 endoscopy and colonoscopy deep ulcerations encompassing 80% on the observed bowel with stigmata of recent bleeding. PMHx:testicular CA, HTN, ankle and patellar fx's, retinal detachment    PT Comments  Pt reporting buttocks pain given frequent loose stools today. Pt agreeable to PT session focused on functional mobility and strengthening. Pt tolerated x2 stand pivot transfers and LE strengthening exercises, pt requiring rest breaks as needed to recover. VSS with no complaints of dizziness noted today. Plan remains appropriate, pt is motivated to progress. Will continue to follow.      If plan is discharge home, recommend the following: A lot of help with walking and/or transfers;A lot of help with bathing/dressing/bathroom;Assistance with cooking/housework;Assist for transportation   Can travel by private vehicle        Equipment Recommendations  Rolling walker (2 wheels);BSC/3in1    Recommendations for Other Services       Precautions / Restrictions Precautions Precautions: Fall;Other (comment) Precaution Comments: anxious, abdominal Restrictions Weight Bearing Restrictions: No     Mobility  Bed Mobility Overal bed mobility: Needs Assistance Bed Mobility: Rolling, Sidelying to Sit, Sit to Sidelying Rolling: Supervision Sidelying to sit:  Supervision     Sit to sidelying: Supervision General bed mobility comments: for safety, cues for sequencing log roll technique    Transfers Overall transfer level: Needs assistance Equipment used: 1 person hand held assist Transfers: Sit to/from Stand, Bed to chair/wheelchair/BSC Sit to Stand: Min assist Stand pivot transfers: Min assist         General transfer comment: light steadying assist, stand x2 from EOB and recliner.    Ambulation/Gait               General Gait Details: unable today given fatigue   Stairs             Wheelchair Mobility     Tilt Bed    Modified Rankin (Stroke Patients Only)       Balance Overall balance assessment: Needs assistance Sitting-balance support: No upper extremity supported, Feet supported Sitting balance-Leahy Scale: Good     Standing balance support: During functional activity, Reliant on assistive device for balance, Single extremity supported Standing balance-Leahy Scale: Fair Standing balance comment: can stand statically without support                            Cognition Arousal: Alert Behavior During Therapy: Flat affect (mildly anxious regarding mobiity) Overall Cognitive Status: Within Functional Limits for tasks assessed                                          Exercises General Exercises - Lower Extremity Long Arc Quad: AROM, Both, 10 reps, Seated Hip Flexion/Marching: AROM, Both, 10 reps, Seated    General Comments  Pertinent Vitals/Pain Pain Assessment Pain Assessment: Faces Faces Pain Scale: Hurts little more Pain Location: buttocks Pain Descriptors / Indicators: Discomfort, Grimacing, Guarding Pain Intervention(s): Limited activity within patient's tolerance, Monitored during session, Repositioned    Home Living                          Prior Function            PT Goals (current goals can now be found in the care plan section)  Acute Rehab PT Goals Patient Stated Goal: rehab then home PT Goal Formulation: With patient Time For Goal Achievement: 01/25/23 Potential to Achieve Goals: Good Progress towards PT goals: Progressing toward goals    Frequency    Min 1X/week      PT Plan      Co-evaluation              AM-PAC PT "6 Clicks" Mobility   Outcome Measure  Help needed turning from your back to your side while in a flat bed without using bedrails?: A Little Help needed moving from lying on your back to sitting on the side of a flat bed without using bedrails?: A Little Help needed moving to and from a bed to a chair (including a wheelchair)?: A Little Help needed standing up from a chair using your arms (e.g., wheelchair or bedside chair)?: A Little Help needed to walk in hospital room?: A Lot Help needed climbing 3-5 steps with a railing? : Total 6 Click Score: 15    End of Session   Activity Tolerance: Patient tolerated treatment well;Patient limited by pain (buttocks pain) Patient left: in bed;with call bell/phone within reach;with bed alarm set Nurse Communication: Mobility status PT Visit Diagnosis: Other abnormalities of gait and mobility (R26.89);Muscle weakness (generalized) (M62.81)     Time: 1308-6578 PT Time Calculation (min) (ACUTE ONLY): 27 min  Charges:    $Therapeutic Activity: 23-37 mins PT General Charges $$ ACUTE PT VISIT: 1 Visit                     Marye Round, PT DPT Acute Rehabilitation Services Secure Chat Preferred  Office [phone]    Leilene Diprima E Christain [place] 01/23/2023, 4:53 PM

## 2023-01-24 ENCOUNTER — Inpatient Hospital Stay (HOSPITAL_COMMUNITY)
Admission: AD | Admit: 2023-01-24 | Discharge: 2023-01-29 | DRG: 945 | Disposition: A | Discharge status: Other Acute Inpatient Hospital | Payer: Medicare Other | Source: Intra-hospital | Attending: Physical Medicine & Rehabilitation | Admitting: Physical Medicine & Rehabilitation

## 2023-01-24 ENCOUNTER — Encounter (HOSPITAL_COMMUNITY): Payer: Self-pay | Admitting: Physical Medicine & Rehabilitation

## 2023-01-24 ENCOUNTER — Other Ambulatory Visit: Payer: Self-pay

## 2023-01-24 DIAGNOSIS — Z9079 Acquired absence of other genital organ(s): Secondary | ICD-10-CM

## 2023-01-24 DIAGNOSIS — I739 Peripheral vascular disease, unspecified: Secondary | ICD-10-CM | POA: Diagnosis present

## 2023-01-24 DIAGNOSIS — K649 Unspecified hemorrhoids: Secondary | ICD-10-CM | POA: Diagnosis present

## 2023-01-24 DIAGNOSIS — L89316 Pressure-induced deep tissue damage of right buttock: Secondary | ICD-10-CM | POA: Diagnosis present

## 2023-01-24 DIAGNOSIS — Z789 Other specified health status: Secondary | ICD-10-CM | POA: Diagnosis not present

## 2023-01-24 DIAGNOSIS — K9189 Other postprocedural complications and disorders of digestive system: Secondary | ICD-10-CM | POA: Diagnosis not present

## 2023-01-24 DIAGNOSIS — D62 Acute posthemorrhagic anemia: Secondary | ICD-10-CM | POA: Diagnosis present

## 2023-01-24 DIAGNOSIS — R5381 Other malaise: Secondary | ICD-10-CM | POA: Diagnosis present

## 2023-01-24 DIAGNOSIS — R109 Unspecified abdominal pain: Secondary | ICD-10-CM | POA: Diagnosis not present

## 2023-01-24 DIAGNOSIS — Z711 Person with feared health complaint in whom no diagnosis is made: Secondary | ICD-10-CM | POA: Diagnosis not present

## 2023-01-24 DIAGNOSIS — Z79899 Other long term (current) drug therapy: Secondary | ICD-10-CM

## 2023-01-24 DIAGNOSIS — Z8547 Personal history of malignant neoplasm of testis: Secondary | ICD-10-CM

## 2023-01-24 DIAGNOSIS — E876 Hypokalemia: Secondary | ICD-10-CM | POA: Diagnosis not present

## 2023-01-24 DIAGNOSIS — Z87442 Personal history of urinary calculi: Secondary | ICD-10-CM

## 2023-01-24 DIAGNOSIS — R197 Diarrhea, unspecified: Secondary | ICD-10-CM | POA: Diagnosis not present

## 2023-01-24 DIAGNOSIS — I129 Hypertensive chronic kidney disease with stage 1 through stage 4 chronic kidney disease, or unspecified chronic kidney disease: Secondary | ICD-10-CM | POA: Diagnosis present

## 2023-01-24 DIAGNOSIS — Z515 Encounter for palliative care: Secondary | ICD-10-CM | POA: Diagnosis not present

## 2023-01-24 DIAGNOSIS — M623 Immobility syndrome (paraplegic): Secondary | ICD-10-CM | POA: Diagnosis not present

## 2023-01-24 DIAGNOSIS — R21 Rash and other nonspecific skin eruption: Secondary | ICD-10-CM | POA: Diagnosis present

## 2023-01-24 DIAGNOSIS — K626 Ulcer of anus and rectum: Secondary | ICD-10-CM | POA: Diagnosis present

## 2023-01-24 DIAGNOSIS — K559 Vascular disorder of intestine, unspecified: Secondary | ICD-10-CM | POA: Diagnosis not present

## 2023-01-24 DIAGNOSIS — I82499 Acute embolism and thrombosis of other specified deep vein of unspecified lower extremity: Secondary | ICD-10-CM | POA: Diagnosis not present

## 2023-01-24 DIAGNOSIS — H5789 Other specified disorders of eye and adnexa: Secondary | ICD-10-CM | POA: Diagnosis not present

## 2023-01-24 DIAGNOSIS — L89326 Pressure-induced deep tissue damage of left buttock: Secondary | ICD-10-CM | POA: Diagnosis present

## 2023-01-24 DIAGNOSIS — E43 Unspecified severe protein-calorie malnutrition: Secondary | ICD-10-CM | POA: Diagnosis present

## 2023-01-24 DIAGNOSIS — N179 Acute kidney failure, unspecified: Secondary | ICD-10-CM | POA: Diagnosis present

## 2023-01-24 DIAGNOSIS — Z79890 Hormone replacement therapy (postmenopausal): Secondary | ICD-10-CM

## 2023-01-24 DIAGNOSIS — Z95828 Presence of other vascular implants and grafts: Secondary | ICD-10-CM

## 2023-01-24 DIAGNOSIS — Z6831 Body mass index (BMI) 31.0-31.9, adult: Secondary | ICD-10-CM

## 2023-01-24 DIAGNOSIS — N1832 Chronic kidney disease, stage 3b: Secondary | ICD-10-CM | POA: Diagnosis present

## 2023-01-24 DIAGNOSIS — I951 Orthostatic hypotension: Secondary | ICD-10-CM | POA: Diagnosis not present

## 2023-01-24 DIAGNOSIS — R339 Retention of urine, unspecified: Secondary | ICD-10-CM | POA: Diagnosis not present

## 2023-01-24 DIAGNOSIS — E785 Hyperlipidemia, unspecified: Secondary | ICD-10-CM | POA: Diagnosis present

## 2023-01-24 DIAGNOSIS — K56609 Unspecified intestinal obstruction, unspecified as to partial versus complete obstruction: Secondary | ICD-10-CM | POA: Diagnosis not present

## 2023-01-24 DIAGNOSIS — Z7189 Other specified counseling: Secondary | ICD-10-CM | POA: Diagnosis not present

## 2023-01-24 DIAGNOSIS — I82452 Acute embolism and thrombosis of left peroneal vein: Secondary | ICD-10-CM | POA: Diagnosis present

## 2023-01-24 DIAGNOSIS — Z9049 Acquired absence of other specified parts of digestive tract: Secondary | ICD-10-CM

## 2023-01-24 DIAGNOSIS — Z66 Do not resuscitate: Secondary | ICD-10-CM | POA: Diagnosis not present

## 2023-01-24 DIAGNOSIS — I82431 Acute embolism and thrombosis of right popliteal vein: Secondary | ICD-10-CM | POA: Diagnosis present

## 2023-01-24 DIAGNOSIS — K219 Gastro-esophageal reflux disease without esophagitis: Secondary | ICD-10-CM | POA: Diagnosis present

## 2023-01-24 DIAGNOSIS — K551 Chronic vascular disorders of intestine: Secondary | ICD-10-CM | POA: Diagnosis not present

## 2023-01-24 DIAGNOSIS — Z888 Allergy status to other drugs, medicaments and biological substances status: Secondary | ICD-10-CM

## 2023-01-24 DIAGNOSIS — I4891 Unspecified atrial fibrillation: Secondary | ICD-10-CM | POA: Diagnosis present

## 2023-01-24 DIAGNOSIS — Z8249 Family history of ischemic heart disease and other diseases of the circulatory system: Secondary | ICD-10-CM

## 2023-01-24 LAB — GLUCOSE, CAPILLARY: Glucose-Capillary: 108 mg/dL — ABNORMAL HIGH (ref 70–99)

## 2023-01-24 LAB — BASIC METABOLIC PANEL
Anion gap: 11 (ref 5–15)
BUN: 32 mg/dL — ABNORMAL HIGH (ref 8–23)
CO2: 23 mmol/L (ref 22–32)
Calcium: 8.1 mg/dL — ABNORMAL LOW (ref 8.9–10.3)
Chloride: 104 mmol/L (ref 98–111)
Creatinine, Ser: 2.17 mg/dL — ABNORMAL HIGH (ref 0.61–1.24)
GFR, Estimated: 32 mL/min — ABNORMAL LOW (ref >60)
Glucose, Bld: 94 mg/dL (ref 70–99)
Potassium: 3.5 mmol/L (ref 3.5–5.1)
Sodium: 138 mmol/L (ref 135–145)

## 2023-01-24 MED ORDER — PROCHLORPERAZINE MALEATE 5 MG PO TABS: 5.0000 mg | ORAL_TABLET | Freq: Four times a day (QID) | ORAL | Status: DC | PRN

## 2023-01-24 MED ORDER — AMLODIPINE BESYLATE 5 MG PO TABS
5.0000 mg | ORAL_TABLET | Freq: Every day | ORAL | Status: DC
  Administered 2023-01-25: 5 mg via ORAL
  Filled 2023-01-24: qty 1

## 2023-01-24 MED ORDER — VITAMIN C 500 MG PO TABS: 500.0000 mg | ORAL_TABLET | Freq: Every day | ORAL | Status: DC

## 2023-01-24 MED ORDER — AMLODIPINE BESYLATE 5 MG PO TABS: 5.0000 mg | ORAL_TABLET | Freq: Every day | ORAL | Status: DC

## 2023-01-24 MED ORDER — GERHARDT'S BUTT CREAM
TOPICAL_CREAM | Freq: Two times a day (BID) | CUTANEOUS | Status: DC
  Filled 2023-01-24: qty 1

## 2023-01-24 MED ORDER — MEDIHONEY WOUND/BURN DRESSING EX PSTE
1.0000 | PASTE | Freq: Every day | CUTANEOUS | Status: DC
  Administered 2023-01-25 – 2023-01-29 (×5): 1 via TOPICAL
  Filled 2023-01-24: qty 44

## 2023-01-24 MED ORDER — BENEPROTEIN PO POWD
1.0000 | Freq: Three times a day (TID) | ORAL | Status: DC
  Administered 2023-01-25: 6 g via ORAL
  Filled 2023-01-24: qty 227

## 2023-01-24 MED ORDER — BANATROL TF EN LIQD
60.0000 mL | Freq: Two times a day (BID) | ENTERAL | Status: DC
  Filled 2023-01-24: qty 60

## 2023-01-24 MED ORDER — PANTOPRAZOLE SODIUM 40 MG PO TBEC
40.0000 mg | DELAYED_RELEASE_TABLET | Freq: Two times a day (BID) | ORAL | Status: DC
  Administered 2023-01-24 – 2023-01-29 (×10): 40 mg via ORAL
  Filled 2023-01-24 (×10): qty 1

## 2023-01-24 MED ORDER — ORAL CARE MOUTH RINSE: 15.0000 mL | OROMUCOSAL | Status: DC | PRN

## 2023-01-24 MED ORDER — PANTOPRAZOLE SODIUM 40 MG PO TBEC
40.0000 mg | DELAYED_RELEASE_TABLET | Freq: Two times a day (BID) | ORAL | Status: DC
Start: 2023-01-24 — End: 2024-01-16

## 2023-01-24 MED ORDER — MELATONIN 5 MG PO TABS
5.0000 mg | ORAL_TABLET | Freq: Every evening | ORAL | Status: DC | PRN
  Administered 2023-01-24 – 2023-01-27 (×4): 5 mg via ORAL
  Filled 2023-01-24 (×4): qty 1

## 2023-01-24 MED ORDER — TORSEMIDE 40 MG PO TABS: 40.0000 mg | ORAL_TABLET | Freq: Every day | ORAL | Status: DC

## 2023-01-24 MED ORDER — RENA-VITE PO TABS
1.0000 | ORAL_TABLET | Freq: Every day | ORAL | Status: DC
  Administered 2023-01-24 – 2023-01-27 (×4): 1 via ORAL
  Filled 2023-01-24 (×5): qty 1

## 2023-01-24 MED ORDER — GUAIFENESIN-DM 100-10 MG/5ML PO SYRP: 5.0000 mL | ORAL_SOLUTION | Freq: Four times a day (QID) | ORAL | Status: DC | PRN

## 2023-01-24 MED ORDER — OXYCODONE HCL 5 MG PO TABS
5.0000 mg | ORAL_TABLET | ORAL | Status: DC | PRN
  Administered 2023-01-28 – 2023-01-29 (×2): 5 mg via ORAL
  Filled 2023-01-24 (×4): qty 1

## 2023-01-24 MED ORDER — TORSEMIDE 20 MG PO TABS
40.0000 mg | ORAL_TABLET | Freq: Every day | ORAL | Status: DC
  Administered 2023-01-25 – 2023-01-27 (×3): 40 mg via ORAL
  Filled 2023-01-24 (×3): qty 2

## 2023-01-24 MED ORDER — AMIODARONE HCL 200 MG PO TABS: 200.0000 mg | ORAL_TABLET | Freq: Every day | ORAL | Status: DC

## 2023-01-24 MED ORDER — TAMSULOSIN HCL 0.4 MG PO CAPS: 0.8000 mg | ORAL_CAPSULE | Freq: Every day | ORAL | Status: AC

## 2023-01-24 MED ORDER — CALCIUM CARBONATE ANTACID 500 MG PO CHEW
200.0000 mg | CHEWABLE_TABLET | Freq: Four times a day (QID) | ORAL | Status: DC | PRN
  Filled 2023-01-24: qty 1

## 2023-01-24 MED ORDER — TAMSULOSIN HCL 0.4 MG PO CAPS
0.8000 mg | ORAL_CAPSULE | Freq: Every day | ORAL | Status: DC
  Administered 2023-01-24 – 2023-01-28 (×5): 0.8 mg via ORAL
  Filled 2023-01-24 (×5): qty 2

## 2023-01-24 MED ORDER — VITAMIN C 500 MG PO TABS
500.0000 mg | ORAL_TABLET | Freq: Every day | ORAL | Status: DC
  Administered 2023-01-25 – 2023-01-29 (×5): 500 mg via ORAL
  Filled 2023-01-24 (×5): qty 1

## 2023-01-24 MED ORDER — BISACODYL 10 MG RE SUPP: 10.0000 mg | Freq: Every day | RECTAL | Status: DC | PRN

## 2023-01-24 MED ORDER — CHLORHEXIDINE GLUCONATE CLOTH 2 % EX PADS
6.0000 | MEDICATED_PAD | Freq: Two times a day (BID) | CUTANEOUS | Status: DC
  Administered 2023-01-24: 6 via TOPICAL

## 2023-01-24 MED ORDER — DIPHENOXYLATE-ATROPINE 2.5-0.025 MG PO TABS
1.0000 | ORAL_TABLET | Freq: Three times a day (TID) | ORAL | Status: DC
  Administered 2023-01-24 – 2023-01-29 (×13): 1 via ORAL
  Filled 2023-01-24 (×14): qty 1

## 2023-01-24 MED ORDER — CHLORHEXIDINE GLUCONATE CLOTH 2 % EX PADS
6.0000 | MEDICATED_PAD | Freq: Every day | CUTANEOUS | Status: DC
  Administered 2023-01-25: 6 via TOPICAL

## 2023-01-24 MED ORDER — LIDOCAINE HCL URETHRAL/MUCOSAL 2 % EX GEL: CUTANEOUS | Status: DC | PRN

## 2023-01-24 MED ORDER — PROCHLORPERAZINE EDISYLATE 10 MG/2ML IJ SOLN
5.0000 mg | Freq: Four times a day (QID) | INTRAMUSCULAR | Status: DC | PRN
  Administered 2023-01-28: 10 mg via INTRAVENOUS
  Filled 2023-01-24: qty 2

## 2023-01-24 MED ORDER — HYDRALAZINE HCL 50 MG PO TABS
50.0000 mg | ORAL_TABLET | Freq: Three times a day (TID) | ORAL | Status: DC
  Administered 2023-01-24 – 2023-01-29 (×14): 50 mg via ORAL
  Filled 2023-01-24 (×15): qty 1

## 2023-01-24 MED ORDER — AMLODIPINE BESYLATE 5 MG PO TABS
5.0000 mg | ORAL_TABLET | Freq: Every day | ORAL | Status: DC
  Administered 2023-01-24: 5 mg via ORAL
  Filled 2023-01-24: qty 1

## 2023-01-24 MED ORDER — PROCHLORPERAZINE 25 MG RE SUPP: 12.5000 mg | Freq: Four times a day (QID) | RECTAL | Status: DC | PRN

## 2023-01-24 MED ORDER — TORSEMIDE 20 MG PO TABS
40.0000 mg | ORAL_TABLET | Freq: Every day | ORAL | Status: DC
  Administered 2023-01-24: 40 mg via ORAL
  Filled 2023-01-24: qty 2

## 2023-01-24 MED ORDER — SIMETHICONE 80 MG PO CHEW
80.0000 mg | CHEWABLE_TABLET | Freq: Four times a day (QID) | ORAL | Status: DC | PRN
  Filled 2023-01-24: qty 1

## 2023-01-24 MED ORDER — ACETAMINOPHEN 325 MG PO TABS: 325.0000 mg | ORAL_TABLET | ORAL | Status: DC | PRN

## 2023-01-24 MED ORDER — AMIODARONE HCL 200 MG PO TABS
200.0000 mg | ORAL_TABLET | Freq: Every day | ORAL | Status: DC
  Administered 2023-01-25 – 2023-01-29 (×5): 200 mg via ORAL
  Filled 2023-01-24 (×5): qty 1

## 2023-01-24 MED ORDER — DIPHENHYDRAMINE HCL 25 MG PO CAPS: 25.0000 mg | ORAL_CAPSULE | Freq: Four times a day (QID) | ORAL | Status: DC | PRN

## 2023-01-24 NOTE — Plan of Care (Signed)
Wound Plan   Braden Score: 14  Sensory: 4  Moisture: 2  Activity: 2  Mobility: 3  Nutrition: 2  Friction: 1   Wounds present:   Unstageable Pressure Injury to Right Buttocks 01/04/23 (POA)  Clean area with normal saline daily  Pat dry Apply skin barrier to outside border of wound to protect good skin Apply Medihoney to yellow area of wound bed Apply foam dressing- change foam dressing every 3 days or PRN if soiled Change dressing to wound every day Date dressing  2.  Wound to open Midline Incision to Abdomen   Clean wound with normal saline daily  Pack wound with moist to dry gauze Cover with ABD pad Date dressing   3.  Skin Tear/ Blisters to back 12/26/22  Clean with normal saline daily Pat dry Apply foam dressing- change every 3 days or PRN if soiled Date dressing  Interventions:   Air mattress Gerhardt's butt cream Medihoney Rena-Vitamin  Register dietician consult (New HD and low protein)  Roho cushion  WOC to assess unstageable weekly Adding double protein portions to meals per RD  Contributors:    Cammie Mcgee MSN, RN, CWOCN, CWCN-AP, CNS  Drusilla Kanner, RDN, LDN  Vedia Pereyra, RN, BSN, CRRN

## 2023-01-24 NOTE — TOC Transition Note (Signed)
Transition of Care [facility]) - CM/SW Discharge Note   Patient Details  Name: [name] [name] MRN: [id number] Date of Birth: 1956-03-06  [facility] CM/SW Contact:  Gala Lewandowsky, RN Phone Number: 01/24/2023, 10:48 AM   Clinical Narrative: Admissions Coordinator with CIR has a bed available. Patient plans to transition to [facility] today. No further needs identified at this time.     Final next level of care: IP Rehab Facility Barriers to Discharge: No Barriers Identified  Patient Goals and CMS Choice CMS Medicare.gov Compare Post Acute Care list provided to:: Patient Represenative (must comment) Choice offered to / list presented to : Spouse   Discharge Plan and Services Additional resources added to the After Visit Summary for   In-house Referral: NA Discharge Planning Services: CM Consult Post Acute Care Choice: NA           Social Determinants of Health (SDOH) Interventions SDOH Screenings   Food Insecurity: No Food Insecurity (12/20/2022)  Housing: Low Risk  (12/20/2022)  Transportation Needs: No Transportation Needs (12/20/2022)  Utilities: Not At Risk (12/20/2022)  Alcohol Screen: Low Risk  (11/20/2022)  Depression (PHQ2-9): Low Risk  (11/20/2022)  Financial Resource Strain: Low Risk  (12/01/2022)  Physical Activity: Sufficiently Active (12/01/2022)  Social Connections: Moderately Integrated (12/01/2022)  Stress: No Stress Concern Present (12/01/2022)  Tobacco Use: Low Risk  (01/10/2023)  Health Literacy: Adequate Health Literacy (11/20/2022)   Readmission Risk Interventions    12/26/2022   11:40 AM  Readmission Risk Prevention Plan  Transportation Screening Complete  HRI or Home Care Consult Complete  Social Work Consult for Recovery Care Planning/Counseling Complete  Palliative Care Screening Not Applicable  Medication Review Oceanographer) Complete

## 2023-01-24 NOTE — H&P (Signed)
Physical Medicine and Rehabilitation Admission H&P    Chief Complaint  Patient presents with   Debility    HPI: [name] [name] is a 67 year old male with history of CKD, HTN, retinal tear R-eye, testicular CA, small bowel GIST s/p resection 2022, multiple episodes of upper and lower GIB, known mesenteric stenosis who started developing abdominal pain with inability to eat or drink or 3-4 weeks, 30 lbs wt loss and buttock pain with claudication and  was admitted from [facility] office on 12/20/22 due to concerns of  embolic event leading to progression of SMA  and right sided iliac stenosis v/s  occlusion. He was started on IV heparin and CTA showed SMA thrombus w/plaque with recommendations of vascular intervention for bowel ischemia. He was taken to OR on 12/21/22 for exploratory lap with resection of necrotic area of jejunum and ABTHERA for open abdomen by Dr. Janee Morn and insertion of SMA stent by Dr. Lenell Antu. Post op with VDRF and required return to OR on 09/14 for reexploration with removal of 70 cm potentially compromised bowel followed by re-exploration on 09/16. He developed hypotension requiring pressors, had oliguric AKI due to ATN and required CRRT 09/22-09/24. He was started on TNA for nutritional support and had difficulty with extubation due to  poor mental status off sedation.   He developed significant hypotension on 09/20, he was found to have blood per NGT and rectum as well as abdominal distension with leucocytosis concerning for bowel ischemia. Repeat CTA showed patent SMA without signs of ischemia, he was transfused with on unit PRBC and antibiotics coverage broadened. He was extubated on 09/22 and required prn haldol for delirium. Mentation slowly improved. NGT removed on 09/27 but kept NPO due to signs of dysphagia. Anticoagulation and antiplatelet resumed  but he started having intermittent bloody BMs with clots on 09/28 with steady drop in Hgb to 6.0  on 09/30 despite discontinuation  of Argatroban but ASA kept on board due to stent. He continued to have significant bleeding with hypotension and A flutter later that evening and was started on amiodarone for rate control as well as multiple units PRBC as well as DDAVP. ASA d/c per Dr. Rhea Belton.  On 10/02, he underwent endoscopy revealing 2 cm HH, erythematous mucosa in gastric body without bleeding, no ulcers and  duodenal angulation. Flex sig revealed severely ulcerated mucosa with stigmata of bleeding in rectosigmoid colon and 80% of rectum as well as scattered apthous ulcers in distal sigmoid colon--biopsy done which showed inflammatory changes c/w ischemia treated with max supportive care with addition units of blood. Bleeding had resolved by 10/03 but he continued to have worsening of renal status with rise in BUN/SCr to 160/4.05 with volume overload therefore internal jugular placed by Dr Deanne Coffer and underwent HD required HD 10/06-  He did continue to require intermittent transfusions to keep Hb> 7.0 and to hold off anticoagulation/antiplatelet given risk of bleeding outweighs benefits. Did have episode of maroon stool on 10/08 with stable Hgb. He was started on diet and TNA weaned off. Intake remains poor with multiple supplements ongoing and flexiseal ordered for diarrhea management.  DTI bilateral buttocks being followed by WOC with medihoney for debridement of nonviable tissue. Wet to dry dressing to midline incision ongoing. Renal status improving with increase in UOP and HD held since 10/14. Torsemide added to help manage edema. Foley removed but has required I/O caths with volumes 400-600 cc due to retention. Flomax added and titrated up to 0.8 mg  yesterday.  He continues to have buttock pain, ongoing issues with frequent loose stools and fatigue with standing attempts. Dizziness has resolved but he's significantly deconditioned. CIR recommended due to functional decline. Currently ambulating 20 feet CG.   ROS: +decreased  endurance   Past Medical History:  Diagnosis Date   Ankle fracture    Cancer (HCC) 1974   testicular cancer-at age 35   Cataract    bilateral sx   GERD (gastroesophageal reflux disease)    hx of   Hx of colonic polyps    pt unsure,thinks this was done in 1982 in [place]. no reports in EPIC   Hyperlipidemia    on meds   Hypertension    on meds   Kidney stones    Patella fracture    Retinal detachment    OD   Retinal tear of right eye    Syncopal episodes 2008    Past Surgical History:  Procedure Laterality Date   AIR/FLUID EXCHANGE Right 06/16/2014   Procedure: AIR/FLUID EXCHANGE;  Surgeon: Sherrie George, MD;  Location: [facility] OR;  Service: Ophthalmology;  Laterality: Right;   ANGIOPLASTY Left 12/21/2022   Procedure: BALLOON ANGIOPLASTY SUPERIOR MESENTERIC ARTERY;  Surgeon: Victorino Sparrow, MD;  Location: [facility] OR;  Service: Vascular;  Laterality: Left;   APPLICATION OF WOUND VAC N/A 12/21/2022   Procedure: APPLICATION OF ABTHERA WOUND VAC;  Surgeon: Violeta Gelinas, MD;  Location: [facility] OR;  Service: General;  Laterality: N/A;   BIOPSY  04/04/2021   Procedure: BIOPSY;  Surgeon: Lemar Lofty., MD;  Location: [facility] ENDOSCOPY;  Service: Gastroenterology;;   BIOPSY  01/10/2023   Procedure: BIOPSY;  Surgeon: Beverley Fiedler, MD;  Location: [facility] ENDOSCOPY;  Service: Gastroenterology;;   BOWEL RESECTION  12/21/2022   Procedure: SMALL BOWEL RESECTION;  Surgeon: Violeta Gelinas, MD;  Location: [facility] OR;  Service: General;;   BOWEL RESECTION N/A 12/25/2022   Procedure: SMALL BOWEL RESECTION W/ANASTOMOSIS X2;  Surgeon: Gaynelle Adu, MD;  Location: [facility] OR;  Service: General;  Laterality: N/A;   CATARACT EXTRACTION     CATARACT EXTRACTION  09/2014   rt eye//left 01/2015   CATARACT EXTRACTION, BILATERAL  2016   per Dr. Elmer Picker    COLONOSCOPY  02/16/2020   per Dr. Rhea Belton, adenomatous polyps, repeat in 3 yrs   COLONOSCOPY N/A 01/10/2023   Procedure: COLONOSCOPY;  Surgeon: Beverley Fiedler, MD;  Location:  [facility] ENDOSCOPY;  Service: Gastroenterology;  Laterality: N/A;   ENTEROSCOPY N/A 04/04/2021   Procedure: ENTEROSCOPY;  Surgeon: Meridee Score Netty Starring., MD;  Location: [facility] ENDOSCOPY;  Service: Gastroenterology;  Laterality: N/A;   ESOPHAGOGASTRODUODENOSCOPY (EGD) WITH PROPOFOL N/A 01/10/2023   Procedure: ESOPHAGOGASTRODUODENOSCOPY (EGD) WITH PROPOFOL;  Surgeon: Beverley Fiedler, MD;  Location: [facility] ENDOSCOPY;  Service: Gastroenterology;  Laterality: N/A;   EYE SURGERY     GAS INSERTION Right 06/16/2014   Procedure: INSERTION OF GAS;  Surgeon: Sherrie George, MD;  Location: [facility] OR;  Service: Ophthalmology;  Laterality: Right;  [id number]   GIVENS CAPSULE STUDY N/A 04/02/2021   Procedure: GIVENS CAPSULE STUDY;  Surgeon: Lemar Lofty., MD;  Location: [facility] ENDOSCOPY;  Service: Gastroenterology;  Laterality: N/A;   HYDROCELE EXCISION     INSERTION OF ILIAC STENT N/A 12/21/2022   Procedure: INSERTION OF SUPERIOR MESENTERIC ARTERY STENT;  Surgeon: Victorino Sparrow, MD;  Location: [facility] OR;  Service: Vascular;  Laterality: N/A;   IR ANGIOGRAM VISCERAL SELECTIVE  04/04/2021   IR FLUORO GUIDE CV LINE RIGHT  01/12/2023  IR FLUORO GUIDE CV LINE RIGHT  01/16/2023   IR US GUIDE VASC ACCESS LEFT  04/04/2021   IR US GUIDE VASC ACCESS RIGHT  01/12/2023   KNEE ARTHROSCOPY Right    x 2   LAPAROTOMY N/A 12/21/2022   Procedure: EXPLORATION LAPAROTOMY;  Surgeon: Violeta Gelinas, MD;  Location: [facility] OR;  Service: General;  Laterality: N/A;   LAPAROTOMY N/A 12/23/2022   Procedure: RE-EXPLORATION LAPAROTOMY ABTHERA VAC EXCHANGE;  Surgeon: Almond Lint, MD;  Location: [facility] OR;  Service: General;  Laterality: N/A;   LAPAROTOMY N/A 12/25/2022   Procedure: RE-EXPLORATION LAPAROTOMY W/CLOSURE;  Surgeon: Gaynelle Adu, MD;  Location: [facility] OR;  Service: General;  Laterality: N/A;   LASER PHOTO ABLATION Right 06/16/2014   Procedure: LASER PHOTO ABLATION;  Surgeon: Sherrie George, MD;  Location: [facility] OR;  Service: Ophthalmology;  Laterality:  Right;  Headscope laser and endolaser    ORCHIECTOMY     right testicle   REPAIR OF COMPLEX TRACTION RETINAL DETACHMENT Right 06/16/2014   Procedure: REPAIR OF COMPLEX TRACTION RETINAL DETACHMENT;  Surgeon: Sherrie George, MD;  Location: [facility] OR;  Service: Ophthalmology;  Laterality: Right;   RETINAL DETACHMENT SURGERY  06/2014   SUBMUCOSAL TATTOO INJECTION  04/04/2021   Procedure: SUBMUCOSAL TATTOO INJECTION;  Surgeon: Lemar Lofty., MD;  Location: [facility] ENDOSCOPY;  Service: Gastroenterology;;   TONSILLECTOMY     ULTRASOUND GUIDANCE FOR VASCULAR ACCESS Left 12/21/2022   Procedure: ULTRASOUND GUIDANCE FOR VASCULAR ACCESS FEMORAL ARTER;  Surgeon: Victorino Sparrow, MD;  Location: [facility] OR;  Service: Vascular;  Laterality: Left;   VISCERAL ANGIOGRAM Left 12/21/2022   Procedure: SUPERIOR MESENTERIC ARTERY ANGIOGRAM;  Surgeon: Victorino Sparrow, MD;  Location: [facility] OR;  Service: Vascular;  Laterality: Left;   VITRECTOMY Right 06/16/2014   WISDOM TOOTH EXTRACTION      Family History  Problem Relation Age of Onset   Parkinson's disease Mother    Arrhythmia Father    Coronary artery disease Other        fhx   Hypertension Other        fhx   Sudden death Other        fhx   Colon cancer Neg Hx    Colon polyps Neg Hx    Esophageal cancer Neg Hx    Rectal cancer Neg Hx    Stomach cancer Neg Hx     Social History:  Married. Independent PTA. He  reports that he has never smoked. He has never used smokeless tobacco. He reports that he does not currently use alcohol. He reports that he does not use drugs.   Allergies  Allergen Reactions   Zestril [Lisinopril] Cough    Medications Prior to Admission  Medication Sig Dispense Refill   chlorthalidone (HYGROTON) 25 MG tablet Take 12.5 mg by mouth every morning.     cloNIDine (CATAPRES) 0.2 MG tablet Take 1 tablet (0.2 mg total) by mouth 2 (two) times daily. 60 tablet 2   hydrALAZINE (APRESOLINE) 50 MG tablet Take 1 tablet (50 mg total) by  mouth 3 (three) times daily. 90 tablet 2   HYDROcodone-acetaminophen (NORCO) 10-325 MG tablet Take 1 tablet by mouth every 6 (six) hours as needed for moderate pain. 20 tablet 0   losartan (COZAAR) 100 MG tablet Take 1 tablet (100 mg total) by mouth daily. 90 tablet 0   ondansetron (ZOFRAN-ODT) 4 MG disintegrating tablet Take 4 mg by mouth every 8 (eight) hours as needed for vomiting or nausea.  rosuvastatin (CRESTOR) 40 MG tablet TAKE 1 TABLET BY MOUTH EVERY DAY 90 tablet 0   sildenafil (VIAGRA) 100 MG tablet Take 1 tablet (100 mg total) by mouth as needed for erectile dysfunction. 10 tablet 5   Syringe/Needle, Disp, (SYRINGE 3CC/22GX1-1/2") 22G X 1-1/2" 3 ML MISC 1 Application by Does not apply route every 14 (fourteen) days. 50 each 0   testosterone cypionate (DEPOTESTOSTERONE CYPIONATE) 200 MG/ML injection Inject 1 mL (200 mg total) into the muscle every 14 (fourteen) days. (Patient not taking: Reported on 12/20/2022) 10 mL 1   Home: Home Living Family/patient expects to be discharged to:: Private residence Living Arrangements: Spouse/significant other Available Help at Discharge: Family, Available 24 hours/day Type of Home: House Home Access: Stairs to enter Secretary/administrator of Steps: 1 Home Layout: One level Bathroom Shower/Tub: Health visitor: Pharmacist, community: Yes Home Equipment: None  Lives With: Spouse   Functional History: Prior Function Mobility Comments: independent with mobility ADLs Comments: independent with ADL;s   Functional Status:  Mobility: Bed Mobility Overal bed mobility: Needs Assistance Bed Mobility: Rolling, Sidelying to Sit, Sit to Sidelying Rolling: Supervision Sidelying to sit: Supervision Supine to sit: Min assist, HOB elevated, Used rails (+2 for pt confidence and comfort) Sit to supine: Min assist Sit to sidelying: Supervision General bed mobility comments: for safety, cues for sequencing log roll  technique Transfers Overall transfer level: Needs assistance Equipment used: 1 person hand held assist Transfers: Sit to/from Stand, Bed to chair/wheelchair/BSC Sit to Stand: Min assist Bed to/from chair/wheelchair/BSC transfer type:: Stand pivot Stand pivot transfers: Min assist Transfer via Lift Equipment: Stedy General transfer comment: light steadying assist, stand x2 from EOB and recliner. Ambulation/Gait General Gait Details: unable today given fatigue   ADL: ADL Overall ADL's : Needs assistance/impaired Eating/Feeding: Independent, Sitting Eating/Feeding Details (indicate cue type and reason): liquid diet. able to hold cup and drink Grooming: Wash/dry hands, Wash/dry face, Set up, Sitting, Bed level Upper Body Bathing: Contact guard assist, Sitting, Bed level Upper Body Bathing Details (indicate cue type and reason): for back Lower Body Bathing: Moderate assistance, Maximal assistance, Bed level Upper Body Dressing : Contact guard assist, Sitting Lower Body Dressing: Moderate assistance, Maximal assistance, Bed level Lower Body Dressing Details (indicate cue type and reason): donning socks Toilet Transfer: Moderate assistance, Stand-pivot, BSC/3in1, +2 for physical assistance Toileting- Clothing Manipulation and Hygiene: Maximal assistance, Total assistance, Bed level Functional mobility during ADLs:  (NT this session) General ADL Comments: deffered transfer per pt   Cognition: Cognition Overall Cognitive Status: Within Functional Limits for tasks assessed Arousal/Alertness: Awake/alert Orientation Level: Oriented X4 Attention: Sustained Sustained Attention: Impaired Sustained Attention Impairment: Verbal basic, Functional basic Memory: Impaired Memory Impairment: Storage deficit, Retrieval deficit Awareness: Appears intact Problem Solving: Impaired Problem Solving Impairment: Verbal basic Cognition Arousal: Alert Behavior During Therapy: Flat affect (mildly  anxious regarding mobiity) Overall Cognitive Status: Within Functional Limits for tasks assessed Area of Impairment: Problem solving Following Commands: Follows multi-step commands consistently Awareness: Emergent Problem Solving: Slow processing General Comments: pt very slow to complete tasks Difficult to assess due to: Impaired communication, Level of arousal   There were no vitals taken for this visit. Physical Exam Gen: no distress, normal appearing HEENT: oral mucosa pink and moist, NCAT Cardio: Reg rate Chest: normal effort, normal rate of breathing Abd: soft, non-distended Ext: mild lower extremity edema Psych: pleasant, normal affect Skin: +R internal jugular catheter. Unstageable w/ slough and eschar to right upper buttock with foam dressing in place,  abdominal incision c/d/i Neuro: 5/5 strength throughout  Results for orders placed or performed during the hospital encounter of 12/20/22 (from the past 48 hour(s))  Glucose, capillary     Status: Abnormal   Collection Time: 01/22/23  6:30 PM  Result Value Ref Range   Glucose-Capillary 102 (H) 70 - 99 mg/dL    Comment: Glucose reference range applies only to samples taken after fasting for at least 8 hours.  Glucose, capillary     Status: None   Collection Time: 01/22/23 11:18 PM  Result Value Ref Range   Glucose-Capillary 95 70 - 99 mg/dL    Comment: Glucose reference range applies only to samples taken after fasting for at least 8 hours.  Glucose, capillary     Status: None   Collection Time: 01/23/23  5:45 AM  Result Value Ref Range   Glucose-Capillary 96 70 - 99 mg/dL    Comment: Glucose reference range applies only to samples taken after fasting for at least 8 hours.  Basic metabolic panel     Status: Abnormal   Collection Time: 01/23/23  6:15 AM  Result Value Ref Range   Sodium 137 135 - 145 mmol/L   Potassium 3.1 (L) 3.5 - 5.1 mmol/L   Chloride 101 98 - 111 mmol/L   CO2 25 22 - 32 mmol/L   Glucose, Bld 91 70  - 99 mg/dL    Comment: Glucose reference range applies only to samples taken after fasting for at least 8 hours.   BUN 35 (H) 8 - 23 mg/dL   Creatinine, Ser 6.57 (H) 0.61 - 1.24 mg/dL   Calcium 7.6 (L) 8.9 - 10.3 mg/dL   GFR, Estimated 34 (L) >60 mL/min    Comment: (NOTE) Calculated using the CKD-EPI Creatinine Equation (2021)    Anion gap 11 5 - 15    Comment: Performed at [facility], [address] [place], [place] [address]  CBC     Status: Abnormal   Collection Time: 01/23/23  6:15 AM  Result Value Ref Range   WBC 5.7 4.0 - 10.5 K/uL   RBC 3.12 (L) 4.22 - 5.81 MIL/uL   Hemoglobin 9.2 (L) 13.0 - 17.0 g/dL   HCT 29.5 (L) 28.4 - 13.2 %   MCV 90.7 80.0 - 100.0 fL   MCH 29.5 26.0 - 34.0 pg   MCHC 32.5 30.0 - 36.0 g/dL   RDW 44.0 10.2 - 72.5 %   Platelets 155 150 - 400 K/uL   nRBC 0.0 0.0 - 0.2 %    Comment: Performed at [facility], [address] [place], [place] [address]  Basic metabolic panel     Status: Abnormal   Collection Time: 01/24/23  5:20 AM  Result Value Ref Range   Sodium 138 135 - 145 mmol/L   Potassium 3.5 3.5 - 5.1 mmol/L   Chloride 104 98 - 111 mmol/L   CO2 23 22 - 32 mmol/L   Glucose, Bld 94 70 - 99 mg/dL    Comment: Glucose reference range applies only to samples taken after fasting for at least 8 hours.   BUN 32 (H) 8 - 23 mg/dL   Creatinine, Ser 0.34 (H) 0.61 - 1.24 mg/dL   Calcium 8.1 (L) 8.9 - 10.3 mg/dL   GFR, Estimated 32 (L) >60 mL/min    Comment: (NOTE) Calculated using the CKD-EPI Creatinine Equation (2021)    Anion gap 11 5 - 15    Comment: Performed at [facility], [address], [place], [place] [address]   No results found.    There were no vitals taken for this visit.  Medical Problem List and Plan: 1.Debility 2/2 necrotic jejunum s/p resection  -will discuss with surgery whether patient may shower  -ELOS/Goals: 10 days modI  Admit to CIR  2.  Antithrombotics: -DVT/anticoagulation:  Mechanical:  Sequential compression devices, below knee Bilateral lower extremities  -antiplatelet therapy: N/A due to GIB 3. Pain Management:  Oxycodone prn.  4. Mood/Behavior/Sleep: LCSW to follow for evaluation and support.   -antipsychotic agents: N/A  --Melatonin prn for insomnia.  5. Neuropsych/cognition: This patient is capable of making decisions on his own behalf. 6. Skin/Wound Care: Routine pressure relief measures. Wet to dry dressing changes BID  --Air mattress overalay for DTI on buttocks.  7. Fluids/Electrolytes/Nutrition: Strict I/O. Monitor renal status daily.  Continue nutritional supplements  --Will change ensure to [facility] instead. .  8. Mesenteric artery stenosis/Ischemic bowel s/p resection w/SMA stent  Re-exploration X 3 with 3 mm stent opening per bowel perfusion study-->reocclusion would have high probability of mortality per VVS  --wet to dry dressing changes to midline incision.  9. H/o Small bowel GIST s/p resection: Complicated hx with prior GIBs, duodenal ulcers, intestinal  angioectasias  w/hemorrhage, diverticulosis w/hemorrhage, gastritis and duodenitis  --consult GI for recurrent bleed.   --continue Protonix 40 mg IV BID 10. ABLA: Due to multiple episodes of rectal bleeding due to deep and severe ulceration in rectum/rectosigmoid  --not a candidate fro anticoagulation due to risk of bleeding.  --Required multiple transfusions 10/01 --to transfuse prn Hgb<7 11. AKI on CKD: Multifactorial due to surgery, contrast and hypotension. Baseline SCr-2.0 (followed by Dr. Glenna Fellows).   --strict I/O. Monitor PVRs and cath to keep volumes <350 cc  --required CRRT followed by HD-->last HD on 10/12 and internal jugular in place.   --Was started on torsemide 10/16 due to BLE edema 12. A fib w/RVR: Monitor HR with increase in activity. Continue amiodarone   13. R-RAS: No plans for intervention as small and felt to have minimal function  14. HTN: Monitor BP TID--continue Norvasc,  hydralazine and Demadex  --monitor electrolytes for recurrent hypokalemia. Recheck Mg level in am. Add magnesium gluconate 250mg  HS   15. Unstageable with slough and eschar to right buttock: continue Medihoney with foam dressing for debridement and moderate drainage.   16. Diarrhea: Multifactorial -->continue banatrol, Will schedule lomotil qid and monitor  I have personally performed a face to face diagnostic evaluation, including, but not limited to relevant history and physical exam findings, of this patient and developed relevant assessment and plan.  Additionally, I have reviewed and concur with the physician assistant's documentation above.  Jacquelynn Cree, PA-C   Horton Chin, MD 01/24/2023

## 2023-01-24 NOTE — Progress Notes (Addendum)
INPATIENT REHABILITATION ADMISSION NOTE   Arrival Method: bed      Mental Orientation: alert  Assessment:done    Skin:photos done with CN ; in chart   IV'S: no piv; RT IJ   Pain:none   Tubes and Drains: none  Safety Measures:done   Vital Signs:done   Height and Weight:done   Rehab Orientation:done   Family: wife    Notes:

## 2023-01-24 NOTE — Progress Notes (Signed)
OT Cancellation Note  Patient Details Name: VERLIN UHER MRN: [id number] DOB: [birth year]   Cancelled Treatment:    Reason Eval/Treat Not Completed: Patient declined, just finished working with PT, ambulated in room, now back in bed. OT will return as schedule allows. He does have his massage therapist coming from 11-12.   Evern Bio Charlyn Vialpando 01/24/2023, 10:29 AM  Nyoka Cowden OTR/L Acute Rehabilitation Services Office: [phone]

## 2023-01-24 NOTE — Progress Notes (Signed)
Inpatient Rehab Admissions Coordinator:   Pt. Will transfer to CIR today. Rn may call report to [phone].  Pt. To transfer to Cir for an estimated 12-14 days of intensive rehab with the goal of discharging home at supervision level and the assistance of his wife.   Megan Salon, MS, CCC-SLP Rehab Admissions Coordinator  [phone] (celll) [phone] (office)

## 2023-01-24 NOTE — Progress Notes (Signed)
Physical Therapy Treatment Patient Details Name: [name] [name] MRN: [id number] DOB: 19-Feb-1956 Today's Date: 01/24/2023   History of Present Illness Pt is a 67 y/o male admitted 9/11 with several weeks of abdominal pain along with a 30 pound weight loss. Work up included mesenteric ischemia and necrotic area of jejunum. S/p Exploratory laparotomy and small bowel resection 9/12, reopening of the laparotomy 9/14, second re-opening of laparotomy and more small bowel resection with anastomosis x2 9/16. 9/18 iHD, Extubated 9/22; 9/26 trans out of ICU; 9/29 rectal bleeding began 9/30-10/1 hypotensive and return to ICU with ongoing bleeding; 10/2 endoscopy and colonoscopy deep ulcerations encompassing 80% on the observed bowel with stigmata of recent bleeding. PMHx:testicular CA, HTN, ankle and patellar fx's, retinal detachment    PT Comments  Pt admitted with above diagnosis. Pt met 5/6 goals and goals revised. Pt did much better today than in previous sessions and was able to progress ambulation in room.  Pt continues to be limited by poor endurance for activity. Continues to need aggressive rehab prior to d/c home.  Pt currently with functional limitations due to the deficits listed below (see PT Problem List). Pt will benefit from acute skilled PT to increase their independence and safety with mobility to allow discharge.       If plan is discharge home, recommend the following: A lot of help with walking and/or transfers;A lot of help with bathing/dressing/bathroom;Assistance with cooking/housework;Assist for transportation   Can travel by private vehicle        Equipment Recommendations  Rolling walker (2 wheels);BSC/3in1    Recommendations for Other Services Rehab consult     Precautions / Restrictions Precautions Precautions: Fall;Other (comment) Precaution Comments: anxious, abdominal Restrictions Weight Bearing Restrictions: No     Mobility  Bed Mobility Overal bed mobility: Needs  Assistance Bed Mobility: Rolling, Sidelying to Sit, Sit to Sidelying Rolling: Supervision Sidelying to sit: Supervision Supine to sit: HOB elevated, Used rails, Supervision Sit to supine: Supervision Sit to sidelying: Supervision General bed mobility comments: for safety, cues for sequencing log roll technique    Transfers Overall transfer level: Needs assistance Equipment used: Rolling walker (2 wheels) Transfers: Sit to/from Stand, Bed to chair/wheelchair/BSC Sit to Stand: Contact guard assist           General transfer comment: light steadying assist.  Pt moves slowly    Ambulation/Gait Ambulation/Gait assistance: Contact guard assist, +2 safety/equipment Gait Distance (Feet): 20 Feet (20 feet x 2) Assistive device: Rolling walker (2 wheels) Gait Pattern/deviations: Step-through pattern, Decreased stride length   Gait velocity interpretation: <1.31 ft/sec, indicative of household ambulator   General Gait Details: Pt wanted to try and walk to bathroom to have BM. Was able to with CGA with use of RW. very fatigued after walking to bathroom and reported dizziness.  Pt walked back to bed and did need assist to throughly clean his bottom.   Stairs             Wheelchair Mobility     Tilt Bed    Modified Rankin (Stroke Patients Only)       Balance Overall balance assessment: Needs assistance Sitting-balance support: No upper extremity supported, Feet supported Sitting balance-Leahy Scale: Good Sitting balance - Comments: no LOB   Standing balance support: During functional activity, Reliant on assistive device for balance, Single extremity supported, Bilateral upper extremity supported Standing balance-Leahy Scale: Fair Standing balance comment: can stand statically without support  Cognition Arousal: Alert Behavior During Therapy: Flat affect (mildly anxious regarding mobiity) Overall Cognitive Status: Within  Functional Limits for tasks assessed Area of Impairment: Problem solving                       Following Commands: Follows multi-step commands consistently   Awareness: Emergent Problem Solving: Slow processing General Comments: pt very slow to complete tasks        Exercises General Exercises - Lower Extremity Ankle Circles/Pumps: AROM, Supine, Both, 10 reps Long Arc Quad: AROM, Both, 10 reps, Seated Heel Slides: AAROM, Both, 5 reps    General Comments General comments (skin integrity, edema, etc.): 89-117 bpm      Pertinent Vitals/Pain Pain Assessment Pain Assessment: Faces Faces Pain Scale: Hurts little more Pain Location: buttocks Pain Descriptors / Indicators: Discomfort, Grimacing, Guarding Pain Intervention(s): Limited activity within patient's tolerance, Monitored during session, Repositioned    Home Living                          Prior Function            PT Goals (current goals can now be found in the care plan section) Acute Rehab PT Goals Patient Stated Goal: rehab then home PT Goal Formulation: With patient Time For Goal Achievement: 02/07/23 Potential to Achieve Goals: Good Progress towards PT goals: Progressing toward goals    Frequency    Min 1X/week      PT Plan      Co-evaluation              AM-PAC PT "6 Clicks" Mobility   Outcome Measure  Help needed turning from your back to your side while in a flat bed without using bedrails?: A Little Help needed moving from lying on your back to sitting on the side of a flat bed without using bedrails?: A Little Help needed moving to and from a bed to a chair (including a wheelchair)?: A Little Help needed standing up from a chair using your arms (e.g., wheelchair or bedside chair)?: A Little Help needed to walk in hospital room?: A Lot Help needed climbing 3-5 steps with a railing? : Total 6 Click Score: 15    End of Session Equipment Utilized During Treatment:  Gait belt Activity Tolerance: Patient tolerated treatment well;Patient limited by pain (buttocks pain) Patient left: in bed;with call bell/phone within reach;with bed alarm set Nurse Communication: Mobility status PT Visit Diagnosis: Other abnormalities of gait and mobility (R26.89);Muscle weakness (generalized) (M62.81) Pain - part of body:  (abdomen)     Time: 5643-3295 PT Time Calculation (min) (ACUTE ONLY): 33 min  Charges:    $Gait Training: 8-22 mins $Self Care/Home Management: 8-22 PT General Charges $$ ACUTE PT VISIT: 1 Visit                     Arlina Sabina M,PT Acute Rehab Services [phone]    Bevelyn Buckles 01/24/2023, 12:58 PM

## 2023-01-24 NOTE — Progress Notes (Signed)
Attempted to give pt his morning meds, however, he booked a 1 hour massage therapist for 11am and does not want to take his meds until after.

## 2023-01-24 NOTE — Discharge Instructions (Addendum)
[facility]      [facility], [place] [phone]  OPEN ABDOMINAL SURGERY: POST OP INSTRUCTIONS  Always review your discharge instruction sheet given to you by the facility where your surgery was performed.  IF YOU HAVE DISABILITY OR FAMILY LEAVE FORMS, YOU MUST BRING THEM TO THE OFFICE FOR PROCESSING.  PLEASE DO NOT GIVE THEM TO YOUR DOCTOR.  A prescription for pain medication may be given to you upon discharge.  Take your pain medication as prescribed, if needed.  If narcotic pain medicine is not needed, then you may take acetaminophen (Tylenol) Take your usually prescribed medications unless otherwise directed. If you need a refill on your pain medication, please contact your pharmacy. They will contact our office to request authorization.  Prescriptions will not be filled after 5pm or on week-ends. You should follow a light diet the first few days after arrival home, such as soup and crackers, pudding, etc.unless your doctor has advised otherwise. A high-fiber, low fat diet can be resumed as tolerated.   Be sure to include lots of fluids daily. Most patients will experience some swelling and bruising on the chest and neck area.  Ice packs will help.  Swelling and bruising can take several days to resolve Most patients will experience some swelling and bruising in the area of the incision. Ice pack will help. Swelling and bruising can take several days to resolve..  It is common to experience some constipation if taking pain medication after surgery.  Increasing fluid intake and taking a stool softener will usually help or prevent this problem from occurring.  A mild laxative (Milk of Magnesia or Miralax) should be taken according to package directions if there are no bowel movements after 48 hours.  ACTIVITIES:  You may resume regular (light) daily activities beginning the next day--such as daily self-care, walking, climbing stairs--gradually increasing activities as tolerated.  You may have  sexual intercourse when it is comfortable.  Refrain from any heavy lifting or straining until approved by your doctor. You may drive when you no longer are taking prescription pain medication, you can comfortably wear a seatbelt, and you can safely maneuver your car and apply brakes Check with vascular surgery before resuming activities as well You should see your doctor in the office for a follow-up appointment approximately two weeks after your surgery.  Make sure that you call for this appointment within a day or two after you arrive home to insure a convenient appointment time.  WHEN TO CALL YOUR DOCTOR: Fever over 101.0 Inability to urinate Nausea and/or vomiting Extreme swelling or bruising Continued bleeding from incision. Increased pain, redness, or drainage from the incision. Difficulty swallowing or breathing Muscle cramping or spasms. Numbness or tingling in hands or feet or around lips.  The clinic staff is available to answer your questions during regular business hours.  Please don't hesitate to call and ask to speak to one of the nurses if you have concerns.  For further questions, please visit [email or web address]   Wet to Dry WOUND CARE: - Change dressing twice daily - Supplies: sterile saline, kerlex, scissors, ABD pads, tape  Remove dressing and all packing carefully, moistening with sterile saline as needed to avoid packing/internal dressing sticking to the wound. 2.   Clean edges of skin around the wound with water/gauze, making sure there is no tape debris or leakage left on skin that could cause skin irritation or breakdown. 3.   Dampen and clean kerlex with sterile saline and pack wound from  wound base to skin level, making sure to take note of any possible areas of wound tracking, tunneling and packing appropriately. Wound can be packed loosely. Trim kerlex to size if a whole kerlex is not required. 4.   Cover wound with a dry ABD pad and secure with  tape.  5.   Write the date/time on the dry dressing/tape to better track when the last dressing change occurred. - apply any skin protectant/powder if recommended by clinician to protect skin/skin folds. - change dressing as needed if leakage occurs, wound gets contaminated, or patient requests to shower. - You may shower daily with wound open and following the shower the wound should be dried and a clean dressing placed.  - Medical grade tape as well as packing supplies can be found at [employer] on [address] or [employer] on [address]. The remaining supplies can be found at your local drug store, [employer] etc.

## 2023-01-24 NOTE — Progress Notes (Signed)
PMR Admission Coordinator Pre-Admission Assessment   Patient: [name] [name] is an 67 y.o., male MRN: [id number] DOB: [birth year] Height: 5\' 11"  (180.3 cm) Weight: 102.1 kg   Insurance Information HMO:     PPO:      PCP:      IPA:      80/20: yes     OTHER:  PRIMARY: Medicare AB      Policy#: [id number]    Subscriber:  Phone#: Verified online    Fax#:  Pre-Cert#:       Employer:  Benefits:  Phone #:      Name:  Eff. Date: Parts A  and B effective 07/10/19 Deduct: $1632      Out of Pocket Max:  None      Life Max: N/A  CIR: 100%      SNF: 100 days Outpatient: 80%     Co-Pay: 20% Home Health: 100%      Co-Pay: none DME: 80%     Co-Pay: 20% Providers: patient's choice SECONDARY: TEFL teacher Supplement      Policy#: [id number]      Phone#:    Financial Counselor:       Phone#:    The "Data Collection Information Summary" for patients in Inpatient Rehabilitation Facilities with attached "Privacy Act Statement-Health Care Records" was provided and verbally reviewed with: Patient   Emergency Contact Information Contact Information       Name Relation Home Work Mobile    Parrish Spouse [phone]   [phone]         Other Contacts   None on File        Current Medical History  Patient Admitting Diagnosis: Necrotic Bowel, s/p Ex/lap and  small bowel resection  History of Present Illness:Pt is a 67 y/o male with PMH of testicular CA, HTN, ankle and patellar fx's, retinal detachment  who was admitted  to [facility] ED 12/20/22. with several weeks of abdominal pain along with a 30 pound weight loss. Work up included mesenteric ischemia and necrotic area of jejunum. S/p Exploratory laparotomy and small bowel resection 9/12, reopening of the laparotomy 9/14, second re-opening of laparotomy and more small bowel resection with anastomosis x2 12/25/22. Pt. Made NPO and started on TPN. On 9/30, Rapid Response called due to large bloody BM with clots and Hypotension.  Received PRBCs.  GI reconsulted. 10/2 endoscopy and colonoscopy deep ulcerations encompassing 80% on the observed bowel with stigmata of recent bleeding. Pt. Also with AKI during admission, but nephrology feels HD no longer indicated and tunneled cath removed 01/24/23. Pt. Seen by PT/OT and they recommend CIR to assist return to PLOF.   Patient's medical record from [facility] has been reviewed by the rehabilitation admission coordinator and physician.   Past Medical History      Past Medical History:  Diagnosis Date   Ankle fracture     Cancer (HCC) 1974    testicular cancer-at age 39   Cataract      bilateral sx   GERD (gastroesophageal reflux disease)      hx of   Hx of colonic polyps      pt unsure,thinks this was done in 1982 in [place]. no reports in EPIC   Hyperlipidemia      on meds   Hypertension      on meds   Kidney stones     Patella fracture     Retinal detachment      OD  Retinal tear of right eye     Syncopal episodes 2008          Has the patient had major surgery during 100 days prior to admission? Yes   Family History   family history includes Arrhythmia in his father; Coronary artery disease in an other family member; Hypertension in an other family member; Parkinson's disease in his mother; Sudden death in an other family member.   Current Medications  Current Medications    Current Facility-Administered Medications:    0.9 %  sodium chloride infusion (Manually program via Guardrails IV Fluids), , Intravenous, Once, Lorin Glass, MD   0.9 %  sodium chloride infusion (Manually program via Guardrails IV Fluids), , Intravenous, Once, John Giovanni, MD   albumin human 5 % solution 12.5 g, 12.5 g, Intravenous, PRN, Lorin Glass, MD, Stopped at 01/01/23 1020   alteplase (CATHFLO ACTIVASE) injection 2 mg, 2 mg, Intracatheter, Once PRN, Anthony Sar, MD   [COMPLETED] amiodarone (NEXTERONE) 1.8 mg/mL load via infusion 150 mg, 150 mg,  Intravenous, Once, 150 mg at 01/02/23 0946 **FOLLOWED BY** [EXPIRED] amiodarone (NEXTERONE PREMIX) 360-4.14 MG/200ML-% (1.8 mg/mL) IV infusion, 60 mg/hr, Intravenous, Continuous, Last Rate: 33.3 mL/hr at 01/02/23 1600, 60 mg/hr at 01/02/23 1600 **FOLLOWED BY** amiodarone (NEXTERONE PREMIX) 360-4.14 MG/200ML-% (1.8 mg/mL) IV infusion, 30 mg/hr, Intravenous, Continuous, Lorin Glass, MD, Last Rate: 16.67 mL/hr at 01/09/23 1300, 30 mg/hr at 01/09/23 1300   anticoagulant sodium citrate solution 5 mL, 5 mL, Intracatheter, PRN, Anthony Sar, MD, 5 mL at 01/02/23 0358   bisacodyl (DULCOLAX) suppository 10 mg, 10 mg, Rectal, Daily PRN, Lorin Glass, MD, 10 mg at 12/29/22 1243   Chlorhexidine Gluconate Cloth 2 % PADS 6 each, 6 each, Topical, Daily, Barnetta Chapel, PA-C, 6 each at 01/09/23 1124   haloperidol lactate (HALDOL) injection 2 mg, 2 mg, Intravenous, Q6H PRN, Lorin Glass, MD, 2 mg at 01/01/23 2031   heparin injection 1,000 Units, 1,000 Units, Intracatheter, PRN, Anthony Sar, MD, 2,400 Units at 12/27/22 1814   hydrALAZINE (APRESOLINE) injection 20 mg, 20 mg, Intravenous, Q4H PRN, Bowser, Grace E, NP, 20 mg at 01/06/23 1609   HYDROmorphone (DILAUDID) injection 0.5-2 mg, 0.5-2 mg, Intravenous, Q3H PRN, Lorin Glass, MD, 1 mg at 01/04/23 1535   lidocaine (PF) (XYLOCAINE) 1 % injection 5 mL, 5 mL, Intradermal, PRN, Anthony Sar, MD   lidocaine-prilocaine (EMLA) cream 1 Application, 1 Application, Topical, PRN, Anthony Sar, MD   Oral care mouth rinse, 15 mL, Mouth Rinse, PRN, Lorin Glass, MD   pantoprazole (PROTONIX) injection 40 mg, 40 mg, Intravenous, Q12H, Lorin Glass, MD, 40 mg at 01/09/23 2956   pentafluoroprop-tetrafluoroeth (GEBAUERS) aerosol 1 Application, 1 Application, Topical, PRN, Anthony Sar, MD   sodium bicarbonate 150 mEq in sterile water 1,150 mL infusion, , Intravenous, Continuous, Lorin Glass, MD, Last Rate: 100 mL/hr at 01/09/23 1313, New Bag at 01/09/23 1313    sodium chloride flush (NS) 0.9 % injection 3 mL, 3 mL, Intravenous, Q12H, Barnetta Chapel, PA-C, 3 mL at 01/09/23 1124   TPN ADULT (ION), , Intravenous, Continuous TPN, Rexford Maus, RPH, Last Rate: 100 mL/hr at 01/09/23 1300, Infusion Verify at 01/09/23 1300   TPN ADULT (ION), , Intravenous, Continuous TPN, Alphia Moh D, RPH     Patients Current Diet:  Diet Order                  Diet clear liquid Room service appropriate? Yes; Fluid consistency: Thin  Diet effective now                         Precautions / Restrictions Precautions Precautions: Fall Restrictions Weight Bearing Restrictions: No    Has the patient had 2 or more falls or a fall with injury in the past year? Yes   Prior Activity Level   Prior Functional Level Self Care: Did the patient need help bathing, dressing, using the toilet or eating? Independent   Indoor Mobility: Did the patient need assistance with walking from room to room (with or without device)? Independent   Stairs: Did the patient need assistance with internal or external stairs (with or without device)? Independent   Functional Cognition: Did the patient need help planning regular tasks such as shopping or remembering to take medications? Independent   Patient Information Are you of Hispanic, Latino/a,or Spanish origin?: A. No, not of Hispanic, Latino/a, or Spanish origin What is your race?: A. White Do you need or want an interpreter to communicate with a doctor or health care staff?: 0. No   Patient's Response To:  Health Literacy and Transportation Is the patient able to respond to health literacy and transportation needs?: Yes Health Literacy - How often do you need to have someone help you when you read instructions, pamphlets, or other written material from your doctor or pharmacy?: Never In the past 12 months, has lack of transportation kept you from medical appointments or from getting medications?: No In the past 12  months, has lack of transportation kept you from meetings, work, or from getting things needed for daily living?: No   Home Assistive Devices / Equipment Home Assistive Devices/Equipment: Blood pressure cuff, Grab bars in shower, Hand-held shower hose, Reacher, Scales, Stimulators (Tens unit) Home Equipment: None   Prior Device Use: Indicate devices/aids used by the patient prior to current illness, exacerbation or injury? None of the above   Current Functional Level Cognition   Arousal/Alertness: Awake/alert Overall Cognitive Status: Impaired/Different from baseline Difficult to assess due to: Impaired communication, Level of arousal Orientation Level: Oriented X4 Attention: Sustained Sustained Attention: Impaired Sustained Attention Impairment: Verbal basic, Functional basic Memory: Impaired Memory Impairment: Storage deficit, Retrieval deficit Awareness: Appears intact Problem Solving: Impaired Problem Solving Impairment: Verbal basic    Extremity Assessment (includes Sensation/Coordination)   Upper Extremity Assessment: Generalized weakness, Right hand dominant  Lower Extremity Assessment: Defer to PT evaluation     ADLs   Overall ADL's : Needs assistance/impaired Eating/Feeding: NPO Grooming: Wash/dry hands, Wash/dry face, Moderate assistance, Sitting Upper Body Bathing: Maximal assistance, Sitting Lower Body Bathing: Total assistance, Bed level Upper Body Dressing : Moderate assistance, Sitting, Maximal assistance Lower Body Dressing: Total assistance, Bed level Toilet Transfer: Moderate assistance, Stand-pivot, BSC/3in1, +2 for physical assistance Toileting- Clothing Manipulation and Hygiene: Total assistance, Sit to/from stand     Mobility   Overal bed mobility: Needs Assistance Bed Mobility: Rolling, Sidelying to Sit Rolling: Mod assist Sidelying to sit: Mod assist (limited by copious stool) General bed mobility comments: cues for technique and initiation, truncal  assist for roll and LE/truncal assist up via L elbow.     Transfers   Overall transfer level: Needs assistance Equipment used: None Transfers: Sit to/from Stand, Bed to chair/wheelchair/BSC Sit to Stand: Mod assist, +2 safety/equipment Bed to/from chair/wheelchair/BSC transfer type:: Squat pivot, Stand pivot Stand pivot transfers: Mod assist, +2 physical assistance General transfer comment: cues and both forward and boost assist for standing and stand pivot.  Ambulation / Gait / Stairs / Wheelchair Mobility   Ambulation/Gait General Gait Details: unable today     Posture / Balance Balance Overall balance assessment: Needs assistance Sitting-balance support: No upper extremity supported, Bilateral upper extremity supported, Feet supported Sitting balance-Leahy Scale: Fair Standing balance support: Bilateral upper extremity supported Standing balance-Leahy Scale: Poor Standing balance comment: reliant on external support     Special needs/care consideration Skin surgical incision and Special service needs      Previous Home Environment (from acute therapy documentation) Living Arrangements: Spouse/significant other  Lives With: Spouse Available Help at Discharge: Family, Available 24 hours/day Type of Home: House Home Layout: One level Home Access: Stairs to enter [employer] of Steps: 1 Bathroom Shower/Tub: Health visitor: Standard Bathroom Accessibility: Yes How Accessible: Accessible via walker Home Care Services: No   Discharge Living Setting Plans for Discharge Living Setting: Patient's home Type of Home at Discharge: House Discharge Home Layout: One level Discharge Home Access: Stairs to enter Entrance Stairs-Rails: None Entrance Stairs-Number of Steps: 1 Discharge Bathroom Shower/Tub: Walk-in shower Discharge Bathroom Toilet: Handicapped height Discharge Bathroom Accessibility: No Does the patient have any problems obtaining your  medications?: No   Social/Family/Support Systems Patient Roles: Spouse Contact Information: [phone] Anticipated Caregiver: Bryken Newey Ability/Limitations of Caregiver: Wife is home with Pt. 24/7 Caregiver Availability: 24/7 Discharge Plan Discussed with Primary Caregiver: Yes Is Caregiver In Agreement with Plan?: Yes   Goals Patient/Family Goal for Rehab: PT/OT Min A Expected length of stay: 16-18 days Pt/Family Agrees to Admission and willing to participate: Yes Program Orientation Provided & Reviewed with Pt/Caregiver Including Roles  & Responsibilities: Yes   Decrease burden of Care through IP rehab admission: Diet advancement, Decrease number of caregivers, Bowel and bladder program, and Patient/family education   Possible need for SNF placement upon discharge: not anticipated   Patient Condition: I have reviewed medical records from [facility] , spoken with CM, and patient and spouse. I met with patient at the bedside for inpatient rehabilitation assessment.  Patient will benefit from ongoing PT and OT, can actively participate in 3 hours of therapy a day 5 days of the week, and can make measurable gains during the admission.  Patient will also benefit from the coordinated team approach during an Inpatient Acute Rehabilitation admission.  The patient will receive intensive therapy as well as Rehabilitation physician, nursing, social worker, and care management interventions.  Due to bladder management, bowel management, safety, skin/wound care, disease management, medication administration, pain management, and patient education the patient requires 24 hour a day rehabilitation nursing.  The patient is currently min A with mobility and basic ADLs.  Discharge setting and therapy post discharge at home with home health is anticipated.  Patient has agreed to participate in the Acute Inpatient Rehabilitation Program and will admit today.   Preadmission Screen Completed  By:  Jeronimo Greaves, 01/09/2023 3:36 PM ______________________________________________________________________   Discussed status with Dr. Carlis Abbott on 01/24/23 at 930 and received approval for admission today.   Admission Coordinator:  Jeronimo Greaves, CCC-SLP, time 1022/Date 01/24/23    Assessment/Plan: Diagnosis: Debility Does the need for close, 24 hr/day Medical supervision in concert with the patient's rehab needs make it unreasonable for this patient to be served in a less intensive setting? Yes Co-Morbidities requiring supervision/potential complications: necrotic bowel, testicular CA, HTN, ankle and patella fractures Due to bladder management, bowel management, safety, skin/wound care, disease management, medication administration, pain management, and patient education, does the  patient require 24 hr/day rehab nursing? Yes Does the patient require coordinated care of a physician, rehab nurse, PT, OT to address physical and functional deficits in the context of the above medical diagnosis(es)? Yes Addressing deficits in the following areas: balance, endurance, locomotion, strength, transferring, bowel/bladder control, bathing, dressing, feeding, grooming, toileting, and psychosocial support Can the patient actively participate in an intensive therapy program of at least 3 hrs of therapy 5 days a week? Yes The potential for patient to make measurable gains while on inpatient rehab is excellent Anticipated functional outcomes upon discharge from inpatient rehab: modified independent PT, modified independent OT, independent SLP Estimated rehab length of stay to reach the above functional goals is: 10 days Anticipated discharge destination: Home 10. Overall Rehab/Functional Prognosis: excellent     MD Signature: Sula Soda, MD

## 2023-01-24 NOTE — Discharge Summary (Signed)
Physician Discharge Summary  [name] [name] ZOX:[id number] DOB: Jul 25, 1955 DOA: 12/20/2022  PCP: [name], MD  Admit date: 12/20/2022 Discharge date: 01/24/2023  Time spent: 40 minutes  Recommendations for Outpatient and CIR Follow-up:  Follow CBC/CMP   Needs follow up with vascular outpatient Continue wet to dry dressing changes to midline abdominal wound per surgery Currently has tunneled dialysis catheter - renal to follow to determine whether it can be removed vs need for continued dialysis  Needs outpatient follow up with cardiology for atrial fibrillation Continue wound care for decubitus ulcer  4 mm pleural nodule, needs outpatient follow up   Discharge Diagnoses:  Principal Problem:   Superior mesenteric artery stenosis (HCC) Active Problems:   Hyperlipidemia   Essential hypertension   Hypoalbuminemia   GERD (gastroesophageal reflux disease)   Acute renal failure superimposed on stage 3a chronic kidney disease (HCC)   Mesenteric ischemia (HCC)   Endotracheal tube present   Protein-calorie malnutrition, severe   Acute respiratory failure (HCC)   Physical deconditioning   Hematochezia   Colon ulcer   Discharge Condition: stable  Diet recommendation: regular  Filed Weights   01/21/23 0500 01/23/23 0538 01/24/23 0500  Weight: 95.7 kg 96.7 kg 96.5 kg    History of present illness:   [name] [name] is an 67 y.o. 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  Abdominal pain continued reported weight loss and poor oral intake.  Admitted on 9/11 with concerns of superior mesenteric artery thrombosis was started on IV heparin infusion vascular surgery was consulted, CT angiogram of abdomen and pelvis was done on 12/21/2022  that showed acute occlusion of the distal SMA as well as the inferior mesenteric artery aching to the OR emergently and was found to have Kassim Guertin necrotic jejunum status post resection.  SMA stent was placed, patient left with an open wound and remained on mechanical ventilation postoperatively.  Had to be taken back to the OR several times as there was Ronniesha Seibold concern of bowel integrity 12/25/2022 there were perforations so further surgical resection was done.  She was also changed to argatroban post op.  Had to be dialyzed started on 12/27/2022, subsequently extubated on 12/31/2022.  Argatroban was held on 01/07/2023 due to rectal bleeding, transfused 2 units of packed red blood cells due to drop in hemoglobin of 6.1.,  Transferred to [facility] on 01/09/2023.  Transferred back to the ICU on 01/09/2023 due to rectal bleeding anticoagulation was held. GI was consulted on 01/10/2019 for endoscopy showed no signs of bleeding and colonoscopy deep ulceration encompassing 80% of the bowel observed with stigmata of recent bleeding. Started back on HD on 01/14/2023. He will need some kind of rehab whether inpatient rehab or skilled nursing facility placement.   Significant Events: 9/11 admitted with abdominal pain 9/12 CT demonstrating mesenteric occlusion, bowel resection and stent placed, to ICU on vent. Left in discontinuity  9/14 back to OR. Left in discontinuity as still concern about bowel integrity 9/16 back to OR  s sig areas of ischemia w/ 2 areas of perforation, another 66cm section of jejunum was resected and area of discontinuity was reanastomosed. Changed to argatroban post-op as unable to get heparin levels detectable  9/17 dropping RASS goal to 0 to -1, assessing for readiness to wean, serum creatinine and BUN still  Physician Discharge Summary  [name] [name] ZOX:[id number] DOB: Jul 25, 1955 DOA: 12/20/2022  PCP: [name], MD  Admit date: 12/20/2022 Discharge date: 01/24/2023  Time spent: 40 minutes  Recommendations for Outpatient and CIR Follow-up:  Follow CBC/CMP   Needs follow up with vascular outpatient Continue wet to dry dressing changes to midline abdominal wound per surgery Currently has tunneled dialysis catheter - renal to follow to determine whether it can be removed vs need for continued dialysis  Needs outpatient follow up with cardiology for atrial fibrillation Continue wound care for decubitus ulcer  4 mm pleural nodule, needs outpatient follow up   Discharge Diagnoses:  Principal Problem:   Superior mesenteric artery stenosis (HCC) Active Problems:   Hyperlipidemia   Essential hypertension   Hypoalbuminemia   GERD (gastroesophageal reflux disease)   Acute renal failure superimposed on stage 3a chronic kidney disease (HCC)   Mesenteric ischemia (HCC)   Endotracheal tube present   Protein-calorie malnutrition, severe   Acute respiratory failure (HCC)   Physical deconditioning   Hematochezia   Colon ulcer   Discharge Condition: stable  Diet recommendation: regular  Filed Weights   01/21/23 0500 01/23/23 0538 01/24/23 0500  Weight: 95.7 kg 96.7 kg 96.5 kg    History of present illness:   [name] [name] is an 67 y.o. 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  Abdominal pain continued reported weight loss and poor oral intake.  Admitted on 9/11 with concerns of superior mesenteric artery thrombosis was started on IV heparin infusion vascular surgery was consulted, CT angiogram of abdomen and pelvis was done on 12/21/2022  that showed acute occlusion of the distal SMA as well as the inferior mesenteric artery aching to the OR emergently and was found to have Kassim Guertin necrotic jejunum status post resection.  SMA stent was placed, patient left with an open wound and remained on mechanical ventilation postoperatively.  Had to be taken back to the OR several times as there was Ronniesha Seibold concern of bowel integrity 12/25/2022 there were perforations so further surgical resection was done.  She was also changed to argatroban post op.  Had to be dialyzed started on 12/27/2022, subsequently extubated on 12/31/2022.  Argatroban was held on 01/07/2023 due to rectal bleeding, transfused 2 units of packed red blood cells due to drop in hemoglobin of 6.1.,  Transferred to [facility] on 01/09/2023.  Transferred back to the ICU on 01/09/2023 due to rectal bleeding anticoagulation was held. GI was consulted on 01/10/2019 for endoscopy showed no signs of bleeding and colonoscopy deep ulceration encompassing 80% of the bowel observed with stigmata of recent bleeding. Started back on HD on 01/14/2023. He will need some kind of rehab whether inpatient rehab or skilled nursing facility placement.   Significant Events: 9/11 admitted with abdominal pain 9/12 CT demonstrating mesenteric occlusion, bowel resection and stent placed, to ICU on vent. Left in discontinuity  9/14 back to OR. Left in discontinuity as still concern about bowel integrity 9/16 back to OR  s sig areas of ischemia w/ 2 areas of perforation, another 66cm section of jejunum was resected and area of discontinuity was reanastomosed. Changed to argatroban post-op as unable to get heparin levels detectable  9/17 dropping RASS goal to 0 to -1, assessing for readiness to wean, serum creatinine and BUN still  the right IJ vein was accessed with Yassmin Binegar 21 gauge needle; the needle tip within the vein was confirmed with ultrasound image documentation. The needle exchanged over Lugene Beougher 018 guidewire for vascular dilator which allowed advancement of Keilani Terrance 16 cm Mahurkar catheter. This was positioned with the tip at the cavoatrial junction. Spot chest radiograph shows good positioning and no pneumothorax. Catheter was flushed and sutured externally with 0-Prolene sutures. Patient tolerated the procedure well. FLUOROSCOPY TIME:  Radiation Exposure Index (as provided by the fluoroscopic device): 1 mGy air Kerma COMPLICATIONS: COMPLICATIONS none IMPRESSION: 1. Technically successful right IJ Mahurkar catheter placement. Electronically Signed   By: Corlis Leak M.D.   On: 01/13/2023 09:29   IR US Guide Vasc Access Right  Result Date: 01/13/2023 CLINICAL DATA:  Renal failure, needs  venous access for dialysis EXAM: EXAM RIGHT IJ CATHETER PLACEMENT UNDER ULTRASOUND AND FLUOROSCOPIC GUIDANCE TECHNIQUE: The procedure, risks (including but not limited to bleeding, infection, organ damage, pneumothorax), benefits, and alternatives were explained to the patient. Questions regarding the procedure were encouraged and answered. The patient understands and consents to the procedure. Patency of the right IJ vein was confirmed with ultrasound with image documentation. An appropriate skin site was determined. Skin site was marked. Region was prepped using maximum barrier technique including cap and mask, sterile gown, sterile gloves, large sterile sheet, and Chlorhexidine as cutaneous antisepsis. The region was infiltrated locally with 1% lidocaine. Under real-time ultrasound guidance, the right IJ vein was accessed with Beniah Magnan 21 gauge needle; the needle tip within the vein was  confirmed with ultrasound image documentation. The needle exchanged over Quinnton Bury 018 guidewire for vascular dilator which allowed advancement of Sadiq Mccauley 16 cm Mahurkar catheter. This was positioned with the tip at the cavoatrial junction. Spot chest radiograph shows good positioning and no pneumothorax. Catheter was flushed and sutured externally with 0-Prolene sutures. Patient tolerated the procedure well. FLUOROSCOPY TIME:  Radiation Exposure Index (as provided by the fluoroscopic device): 1 mGy air Kerma COMPLICATIONS: COMPLICATIONS none IMPRESSION: 1. Technically successful right IJ Mahurkar catheter placement. Electronically Signed   By: Corlis Leak M.D.   On: 01/13/2023 09:29   DG Chest Port 1 View  Result Date: 01/09/2023 CLINICAL DATA:  Shortness of breath with abdominal pain for several days. History of hypertension. EXAM: PORTABLE CHEST 1 VIEW COMPARISON:  Radiographs 12/29/2022 and 12/23/2022. Chest CT 12/29/2022. FINDINGS: 1121 hours. Interval extubation and removal of the enteric tube. Right IJ central venous catheter has been removed. Left IJ central venous catheter appears unchanged, projecting to the mid SVC level. The heart size and mediastinal contours are stable. There are persistent low lung volumes. The lungs appear clear. No pleural effusion or pneumothorax. The bones appear unchanged, without acute findings. Telemetry leads overlie the chest. IMPRESSION: Interval extubation and removal of the enteric tube and right IJ central venous catheter. No acute cardiopulmonary process. Electronically Signed   By: Carey Bullocks M.D.   On: 01/09/2023 13:23   DG Swallowing Func-Speech Pathology  Result Date: 01/08/2023 Table formatting from the original result was not included. Modified Barium Swallow Study Patient Details Name: [name] [name] MRN: [id number] Date of Birth: 18-Sep-1955 Today's Date: 01/08/2023 HPI/PMH: HPI: Pt is [name] [name] 67 yo male presenting to ED from OP vascular surgery office with known mesenteric  artery stenosis s/p SMA angiogram. Admitted 9/11 with several week history of abdominal pain with 30 lb weight loss. W/u included mesenteric ischemia and necrotic area of jejunum s/p exploratory laparotomy and small bowel resection 9/12, reopening  the right IJ vein was accessed with Yassmin Binegar 21 gauge needle; the needle tip within the vein was confirmed with ultrasound image documentation. The needle exchanged over Lugene Beougher 018 guidewire for vascular dilator which allowed advancement of Keilani Terrance 16 cm Mahurkar catheter. This was positioned with the tip at the cavoatrial junction. Spot chest radiograph shows good positioning and no pneumothorax. Catheter was flushed and sutured externally with 0-Prolene sutures. Patient tolerated the procedure well. FLUOROSCOPY TIME:  Radiation Exposure Index (as provided by the fluoroscopic device): 1 mGy air Kerma COMPLICATIONS: COMPLICATIONS none IMPRESSION: 1. Technically successful right IJ Mahurkar catheter placement. Electronically Signed   By: Corlis Leak M.D.   On: 01/13/2023 09:29   IR US Guide Vasc Access Right  Result Date: 01/13/2023 CLINICAL DATA:  Renal failure, needs  venous access for dialysis EXAM: EXAM RIGHT IJ CATHETER PLACEMENT UNDER ULTRASOUND AND FLUOROSCOPIC GUIDANCE TECHNIQUE: The procedure, risks (including but not limited to bleeding, infection, organ damage, pneumothorax), benefits, and alternatives were explained to the patient. Questions regarding the procedure were encouraged and answered. The patient understands and consents to the procedure. Patency of the right IJ vein was confirmed with ultrasound with image documentation. An appropriate skin site was determined. Skin site was marked. Region was prepped using maximum barrier technique including cap and mask, sterile gown, sterile gloves, large sterile sheet, and Chlorhexidine as cutaneous antisepsis. The region was infiltrated locally with 1% lidocaine. Under real-time ultrasound guidance, the right IJ vein was accessed with Beniah Magnan 21 gauge needle; the needle tip within the vein was  confirmed with ultrasound image documentation. The needle exchanged over Quinnton Bury 018 guidewire for vascular dilator which allowed advancement of Sadiq Mccauley 16 cm Mahurkar catheter. This was positioned with the tip at the cavoatrial junction. Spot chest radiograph shows good positioning and no pneumothorax. Catheter was flushed and sutured externally with 0-Prolene sutures. Patient tolerated the procedure well. FLUOROSCOPY TIME:  Radiation Exposure Index (as provided by the fluoroscopic device): 1 mGy air Kerma COMPLICATIONS: COMPLICATIONS none IMPRESSION: 1. Technically successful right IJ Mahurkar catheter placement. Electronically Signed   By: Corlis Leak M.D.   On: 01/13/2023 09:29   DG Chest Port 1 View  Result Date: 01/09/2023 CLINICAL DATA:  Shortness of breath with abdominal pain for several days. History of hypertension. EXAM: PORTABLE CHEST 1 VIEW COMPARISON:  Radiographs 12/29/2022 and 12/23/2022. Chest CT 12/29/2022. FINDINGS: 1121 hours. Interval extubation and removal of the enteric tube. Right IJ central venous catheter has been removed. Left IJ central venous catheter appears unchanged, projecting to the mid SVC level. The heart size and mediastinal contours are stable. There are persistent low lung volumes. The lungs appear clear. No pleural effusion or pneumothorax. The bones appear unchanged, without acute findings. Telemetry leads overlie the chest. IMPRESSION: Interval extubation and removal of the enteric tube and right IJ central venous catheter. No acute cardiopulmonary process. Electronically Signed   By: Carey Bullocks M.D.   On: 01/09/2023 13:23   DG Swallowing Func-Speech Pathology  Result Date: 01/08/2023 Table formatting from the original result was not included. Modified Barium Swallow Study Patient Details Name: [name] [name] MRN: [id number] Date of Birth: 18-Sep-1955 Today's Date: 01/08/2023 HPI/PMH: HPI: Pt is [name] [name] 67 yo male presenting to ED from OP vascular surgery office with known mesenteric  artery stenosis s/p SMA angiogram. Admitted 9/11 with several week history of abdominal pain with 30 lb weight loss. W/u included mesenteric ischemia and necrotic area of jejunum s/p exploratory laparotomy and small bowel resection 9/12, reopening  Physician Discharge Summary  [name] [name] ZOX:[id number] DOB: Jul 25, 1955 DOA: 12/20/2022  PCP: [name], MD  Admit date: 12/20/2022 Discharge date: 01/24/2023  Time spent: 40 minutes  Recommendations for Outpatient and CIR Follow-up:  Follow CBC/CMP   Needs follow up with vascular outpatient Continue wet to dry dressing changes to midline abdominal wound per surgery Currently has tunneled dialysis catheter - renal to follow to determine whether it can be removed vs need for continued dialysis  Needs outpatient follow up with cardiology for atrial fibrillation Continue wound care for decubitus ulcer  4 mm pleural nodule, needs outpatient follow up   Discharge Diagnoses:  Principal Problem:   Superior mesenteric artery stenosis (HCC) Active Problems:   Hyperlipidemia   Essential hypertension   Hypoalbuminemia   GERD (gastroesophageal reflux disease)   Acute renal failure superimposed on stage 3a chronic kidney disease (HCC)   Mesenteric ischemia (HCC)   Endotracheal tube present   Protein-calorie malnutrition, severe   Acute respiratory failure (HCC)   Physical deconditioning   Hematochezia   Colon ulcer   Discharge Condition: stable  Diet recommendation: regular  Filed Weights   01/21/23 0500 01/23/23 0538 01/24/23 0500  Weight: 95.7 kg 96.7 kg 96.5 kg    History of present illness:   [name] [name] is an 67 y.o. 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  Abdominal pain continued reported weight loss and poor oral intake.  Admitted on 9/11 with concerns of superior mesenteric artery thrombosis was started on IV heparin infusion vascular surgery was consulted, CT angiogram of abdomen and pelvis was done on 12/21/2022  that showed acute occlusion of the distal SMA as well as the inferior mesenteric artery aching to the OR emergently and was found to have Kassim Guertin necrotic jejunum status post resection.  SMA stent was placed, patient left with an open wound and remained on mechanical ventilation postoperatively.  Had to be taken back to the OR several times as there was Ronniesha Seibold concern of bowel integrity 12/25/2022 there were perforations so further surgical resection was done.  She was also changed to argatroban post op.  Had to be dialyzed started on 12/27/2022, subsequently extubated on 12/31/2022.  Argatroban was held on 01/07/2023 due to rectal bleeding, transfused 2 units of packed red blood cells due to drop in hemoglobin of 6.1.,  Transferred to [facility] on 01/09/2023.  Transferred back to the ICU on 01/09/2023 due to rectal bleeding anticoagulation was held. GI was consulted on 01/10/2019 for endoscopy showed no signs of bleeding and colonoscopy deep ulceration encompassing 80% of the bowel observed with stigmata of recent bleeding. Started back on HD on 01/14/2023. He will need some kind of rehab whether inpatient rehab or skilled nursing facility placement.   Significant Events: 9/11 admitted with abdominal pain 9/12 CT demonstrating mesenteric occlusion, bowel resection and stent placed, to ICU on vent. Left in discontinuity  9/14 back to OR. Left in discontinuity as still concern about bowel integrity 9/16 back to OR  s sig areas of ischemia w/ 2 areas of perforation, another 66cm section of jejunum was resected and area of discontinuity was reanastomosed. Changed to argatroban post-op as unable to get heparin levels detectable  9/17 dropping RASS goal to 0 to -1, assessing for readiness to wean, serum creatinine and BUN still  12/21/22 FINDINGS: Enteric tube courses below diaphragm with side hole and tip projecting over the expected location of the stomach. Paucity of bowel gas limits the ability to assess for obstruction, but within this limitation. No radio-opaque calculi or other significant radiographic abnormality are seen. Lung bases are clear IMPRESSION: Enteric tube courses below diaphragm with side hole and tip projecting over the expected location of the stomach. Electronically Signed   By: Lorenza Cambridge M.D.   On: 12/28/2022 12:24   VAS US RENAL ARTERY DUPLEX  Result Date: 12/26/2022 ABDOMINAL VISCERAL Patient Name:  [name] [name]   Date of Exam:   12/26/2022 Medical Rec #: [id number]      Accession #:    [id number] Date of Birth: Apr 12, 1955      Patient Gender: M Patient Age:   66 years Exam Location:  [facility] Procedure:      VAS US RENAL ARTERY DUPLEX Referring Phys: [id number] JOSHUA E ROBINS -------------------------------------------------------------------------------- Indications: Concerned for right renal artery occlusion. High Risk Factors: Hypertension, hyperlipidemia. Vascular Interventions: SMA stenting for acute mesenteric ischemia on 12/23/22. Limitations: Air/bowel gas, obesity and BANDAGES IN PLACE FROM SURGERY ON 12/25/22 - RE-EXPLORATION LAPAROTOMY W/CLOSURE (Abdomen) SMALL BOWEL RESECTION W/ANASTOMOSIS. Comparison Study: Renal duplex on 12/05/22 - Patent renal arteries bilaterally                   with small right kidney. Performing Technologist: Erin Hearing  Examination Guidelines: [place] complete evaluation includes B-mode imaging, spectral Doppler, color Doppler, and power Doppler as needed of all accessible portions of each vessel. Bilateral testing is considered an integral part of [place] complete examination. Limited examinations for reoccurring indications may be performed as noted.  Duplex Findings:  Mesenteric Technologist observations: Aorta and mesenteric and celiac arteries not visualized due to s/p surgery.   +------------------+--------+--------+--------------+ Right Renal ArteryPSV cm/sEDV cm/s   Comment     +------------------+--------+--------+--------------+ Origin                            Not visualized +------------------+--------+--------+--------------+ Proximal             54      24                  +------------------+--------+--------+--------------+ Mid                  53      22                  +------------------+--------+--------+--------------+ Distal               64      29                  +------------------+--------+--------+--------------+  +-----------------+--------+--------+-------------------+ Left Renal ArteryPSV cm/sEDV cm/s      Comment       +-----------------+--------+--------+-------------------+ Origin              73      15   not well visualized +-----------------+--------+--------+-------------------+ Proximal            93      18                       +-----------------+--------+--------+-------------------+ Mid                 96      13                       +-----------------+--------+--------+-------------------+  the right IJ vein was accessed with Yassmin Binegar 21 gauge needle; the needle tip within the vein was confirmed with ultrasound image documentation. The needle exchanged over Lugene Beougher 018 guidewire for vascular dilator which allowed advancement of Keilani Terrance 16 cm Mahurkar catheter. This was positioned with the tip at the cavoatrial junction. Spot chest radiograph shows good positioning and no pneumothorax. Catheter was flushed and sutured externally with 0-Prolene sutures. Patient tolerated the procedure well. FLUOROSCOPY TIME:  Radiation Exposure Index (as provided by the fluoroscopic device): 1 mGy air Kerma COMPLICATIONS: COMPLICATIONS none IMPRESSION: 1. Technically successful right IJ Mahurkar catheter placement. Electronically Signed   By: Corlis Leak M.D.   On: 01/13/2023 09:29   IR US Guide Vasc Access Right  Result Date: 01/13/2023 CLINICAL DATA:  Renal failure, needs  venous access for dialysis EXAM: EXAM RIGHT IJ CATHETER PLACEMENT UNDER ULTRASOUND AND FLUOROSCOPIC GUIDANCE TECHNIQUE: The procedure, risks (including but not limited to bleeding, infection, organ damage, pneumothorax), benefits, and alternatives were explained to the patient. Questions regarding the procedure were encouraged and answered. The patient understands and consents to the procedure. Patency of the right IJ vein was confirmed with ultrasound with image documentation. An appropriate skin site was determined. Skin site was marked. Region was prepped using maximum barrier technique including cap and mask, sterile gown, sterile gloves, large sterile sheet, and Chlorhexidine as cutaneous antisepsis. The region was infiltrated locally with 1% lidocaine. Under real-time ultrasound guidance, the right IJ vein was accessed with Beniah Magnan 21 gauge needle; the needle tip within the vein was  confirmed with ultrasound image documentation. The needle exchanged over [place]ton Bury 018 guidewire for vascular dilator which allowed advancement of Sadiq Mccauley 16 cm Mahurkar catheter. This was positioned with the tip at the cavoatrial junction. Spot chest radiograph shows good positioning and no pneumothorax. Catheter was flushed and sutured externally with 0-Prolene sutures. Patient tolerated the procedure well. FLUOROSCOPY TIME:  Radiation Exposure Index (as provided by the fluoroscopic device): 1 mGy air Kerma COMPLICATIONS: COMPLICATIONS none IMPRESSION: 1. Technically successful right IJ Mahurkar catheter placement. Electronically Signed   By: Corlis Leak M.D.   On: 01/13/2023 09:29   DG Chest Port 1 View  Result Date: 01/09/2023 CLINICAL DATA:  Shortness of breath with abdominal pain for several days. History of hypertension. EXAM: PORTABLE CHEST 1 VIEW COMPARISON:  Radiographs 12/29/2022 and 12/23/2022. Chest CT 12/29/2022. FINDINGS: 1121 hours. Interval extubation and removal of the enteric tube. Right IJ central venous catheter has been removed. Left IJ central venous catheter appears unchanged, projecting to the mid SVC level. The heart size and mediastinal contours are stable. There are persistent low lung volumes. The lungs appear clear. No pleural effusion or pneumothorax. The bones appear unchanged, without acute findings. Telemetry leads overlie the chest. IMPRESSION: Interval extubation and removal of the enteric tube and right IJ central venous catheter. No acute cardiopulmonary process. Electronically Signed   By: Carey Bullocks M.D.   On: 01/09/2023 13:23   DG Swallowing Func-Speech Pathology  Result Date: 01/08/2023 Table formatting from the original result was not included. Modified Barium Swallow Study Patient Details Name: [name] [name] MRN: [id number] Date of Birth: 18-Sep-1955 Today's Date: 01/08/2023 HPI/PMH: HPI: Pt is [name] [name] 67 yo male presenting to ED from OP vascular surgery office with known mesenteric  artery stenosis s/p SMA angiogram. Admitted 9/11 with several week history of abdominal pain with 30 lb weight loss. W/u included mesenteric ischemia and necrotic area of jejunum s/p exploratory laparotomy and small bowel resection 9/12, reopening  Physician Discharge Summary  [name] [name] ZOX:[id number] DOB: Jul 25, 1955 DOA: 12/20/2022  PCP: [name], MD  Admit date: 12/20/2022 Discharge date: 01/24/2023  Time spent: 40 minutes  Recommendations for Outpatient and CIR Follow-up:  Follow CBC/CMP   Needs follow up with vascular outpatient Continue wet to dry dressing changes to midline abdominal wound per surgery Currently has tunneled dialysis catheter - renal to follow to determine whether it can be removed vs need for continued dialysis  Needs outpatient follow up with cardiology for atrial fibrillation Continue wound care for decubitus ulcer  4 mm pleural nodule, needs outpatient follow up   Discharge Diagnoses:  Principal Problem:   Superior mesenteric artery stenosis (HCC) Active Problems:   Hyperlipidemia   Essential hypertension   Hypoalbuminemia   GERD (gastroesophageal reflux disease)   Acute renal failure superimposed on stage 3a chronic kidney disease (HCC)   Mesenteric ischemia (HCC)   Endotracheal tube present   Protein-calorie malnutrition, severe   Acute respiratory failure (HCC)   Physical deconditioning   Hematochezia   Colon ulcer   Discharge Condition: stable  Diet recommendation: regular  Filed Weights   01/21/23 0500 01/23/23 0538 01/24/23 0500  Weight: 95.7 kg 96.7 kg 96.5 kg    History of present illness:   [name] [name] is an 67 y.o. 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  Abdominal pain continued reported weight loss and poor oral intake.  Admitted on 9/11 with concerns of superior mesenteric artery thrombosis was started on IV heparin infusion vascular surgery was consulted, CT angiogram of abdomen and pelvis was done on 12/21/2022  that showed acute occlusion of the distal SMA as well as the inferior mesenteric artery aching to the OR emergently and was found to have Kassim Guertin necrotic jejunum status post resection.  SMA stent was placed, patient left with an open wound and remained on mechanical ventilation postoperatively.  Had to be taken back to the OR several times as there was Ronniesha Seibold concern of bowel integrity 12/25/2022 there were perforations so further surgical resection was done.  She was also changed to argatroban post op.  Had to be dialyzed started on 12/27/2022, subsequently extubated on 12/31/2022.  Argatroban was held on 01/07/2023 due to rectal bleeding, transfused 2 units of packed red blood cells due to drop in hemoglobin of 6.1.,  Transferred to [facility] on 01/09/2023.  Transferred back to the ICU on 01/09/2023 due to rectal bleeding anticoagulation was held. GI was consulted on 01/10/2019 for endoscopy showed no signs of bleeding and colonoscopy deep ulceration encompassing 80% of the bowel observed with stigmata of recent bleeding. Started back on HD on 01/14/2023. He will need some kind of rehab whether inpatient rehab or skilled nursing facility placement.   Significant Events: 9/11 admitted with abdominal pain 9/12 CT demonstrating mesenteric occlusion, bowel resection and stent placed, to ICU on vent. Left in discontinuity  9/14 back to OR. Left in discontinuity as still concern about bowel integrity 9/16 back to OR  s sig areas of ischemia w/ 2 areas of perforation, another 66cm section of jejunum was resected and area of discontinuity was reanastomosed. Changed to argatroban post-op as unable to get heparin levels detectable  9/17 dropping RASS goal to 0 to -1, assessing for readiness to wean, serum creatinine and BUN still  the right IJ vein was accessed with Yassmin Binegar 21 gauge needle; the needle tip within the vein was confirmed with ultrasound image documentation. The needle exchanged over Lugene Beougher 018 guidewire for vascular dilator which allowed advancement of Keilani Terrance 16 cm Mahurkar catheter. This was positioned with the tip at the cavoatrial junction. Spot chest radiograph shows good positioning and no pneumothorax. Catheter was flushed and sutured externally with 0-Prolene sutures. Patient tolerated the procedure well. FLUOROSCOPY TIME:  Radiation Exposure Index (as provided by the fluoroscopic device): 1 mGy air Kerma COMPLICATIONS: COMPLICATIONS none IMPRESSION: 1. Technically successful right IJ Mahurkar catheter placement. Electronically Signed   By: Corlis Leak M.D.   On: 01/13/2023 09:29   IR US Guide Vasc Access Right  Result Date: 01/13/2023 CLINICAL DATA:  Renal failure, needs  venous access for dialysis EXAM: EXAM RIGHT IJ CATHETER PLACEMENT UNDER ULTRASOUND AND FLUOROSCOPIC GUIDANCE TECHNIQUE: The procedure, risks (including but not limited to bleeding, infection, organ damage, pneumothorax), benefits, and alternatives were explained to the patient. Questions regarding the procedure were encouraged and answered. The patient understands and consents to the procedure. Patency of the right IJ vein was confirmed with ultrasound with image documentation. An appropriate skin site was determined. Skin site was marked. Region was prepped using maximum barrier technique including cap and mask, sterile gown, sterile gloves, large sterile sheet, and Chlorhexidine as cutaneous antisepsis. The region was infiltrated locally with 1% lidocaine. Under real-time ultrasound guidance, the right IJ vein was accessed with Beniah Magnan 21 gauge needle; the needle tip within the vein was  confirmed with ultrasound image documentation. The needle exchanged over [place] Bury 018 guidewire for vascular dilator which allowed advancement of Sadiq Mccauley 16 cm Mahurkar catheter. This was positioned with the tip at the cavoatrial junction. Spot chest radiograph shows good positioning and no pneumothorax. Catheter was flushed and sutured externally with 0-Prolene sutures. Patient tolerated the procedure well. FLUOROSCOPY TIME:  Radiation Exposure Index (as provided by the fluoroscopic device): 1 mGy air Kerma COMPLICATIONS: COMPLICATIONS none IMPRESSION: 1. Technically successful right IJ Mahurkar catheter placement. Electronically Signed   By: Corlis Leak M.D.   On: 01/13/2023 09:29   DG Chest Port 1 View  Result Date: 01/09/2023 CLINICAL DATA:  Shortness of breath with abdominal pain for several days. History of hypertension. EXAM: PORTABLE CHEST 1 VIEW COMPARISON:  Radiographs 12/29/2022 and 12/23/2022. Chest CT 12/29/2022. FINDINGS: 1121 hours. Interval extubation and removal of the enteric tube. Right IJ central venous catheter has been removed. Left IJ central venous catheter appears unchanged, projecting to the mid SVC level. The heart size and mediastinal contours are stable. There are persistent low lung volumes. The lungs appear clear. No pleural effusion or pneumothorax. The bones appear unchanged, without acute findings. Telemetry leads overlie the chest. IMPRESSION: Interval extubation and removal of the enteric tube and right IJ central venous catheter. No acute cardiopulmonary process. Electronically Signed   By: Carey Bullocks M.D.   On: 01/09/2023 13:23   DG Swallowing Func-Speech Pathology  Result Date: 01/08/2023 Table formatting from the original result was not included. Modified Barium Swallow Study Patient Details Name: [name] [name] MRN: [id number] Date of Birth: 18-Sep-1955 Today's Date: 01/08/2023 HPI/PMH: HPI: Pt is [name] [name] 67 yo male presenting to ED from OP vascular surgery office with known mesenteric  artery stenosis s/p SMA angiogram. Admitted 9/11 with several week history of abdominal pain with 30 lb weight loss. W/u included mesenteric ischemia and necrotic area of jejunum s/p exploratory laparotomy and small bowel resection 9/12, reopening  Physician Discharge Summary  [name] [name] ZOX:[id number] DOB: Jul 25, 1955 DOA: 12/20/2022  PCP: [name], MD  Admit date: 12/20/2022 Discharge date: 01/24/2023  Time spent: 40 minutes  Recommendations for Outpatient and CIR Follow-up:  Follow CBC/CMP   Needs follow up with vascular outpatient Continue wet to dry dressing changes to midline abdominal wound per surgery Currently has tunneled dialysis catheter - renal to follow to determine whether it can be removed vs need for continued dialysis  Needs outpatient follow up with cardiology for atrial fibrillation Continue wound care for decubitus ulcer  4 mm pleural nodule, needs outpatient follow up   Discharge Diagnoses:  Principal Problem:   Superior mesenteric artery stenosis (HCC) Active Problems:   Hyperlipidemia   Essential hypertension   Hypoalbuminemia   GERD (gastroesophageal reflux disease)   Acute renal failure superimposed on stage 3a chronic kidney disease (HCC)   Mesenteric ischemia (HCC)   Endotracheal tube present   Protein-calorie malnutrition, severe   Acute respiratory failure (HCC)   Physical deconditioning   Hematochezia   Colon ulcer   Discharge Condition: stable  Diet recommendation: regular  Filed Weights   01/21/23 0500 01/23/23 0538 01/24/23 0500  Weight: 95.7 kg 96.7 kg 96.5 kg    History of present illness:   [name] [name] is an 67 y.o. 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  Abdominal pain continued reported weight loss and poor oral intake.  Admitted on 9/11 with concerns of superior mesenteric artery thrombosis was started on IV heparin infusion vascular surgery was consulted, CT angiogram of abdomen and pelvis was done on 12/21/2022  that showed acute occlusion of the distal SMA as well as the inferior mesenteric artery aching to the OR emergently and was found to have Kassim Guertin necrotic jejunum status post resection.  SMA stent was placed, patient left with an open wound and remained on mechanical ventilation postoperatively.  Had to be taken back to the OR several times as there was Ronniesha Seibold concern of bowel integrity 12/25/2022 there were perforations so further surgical resection was done.  She was also changed to argatroban post op.  Had to be dialyzed started on 12/27/2022, subsequently extubated on 12/31/2022.  Argatroban was held on 01/07/2023 due to rectal bleeding, transfused 2 units of packed red blood cells due to drop in hemoglobin of 6.1.,  Transferred to [facility] on 01/09/2023.  Transferred back to the ICU on 01/09/2023 due to rectal bleeding anticoagulation was held. GI was consulted on 01/10/2019 for endoscopy showed no signs of bleeding and colonoscopy deep ulceration encompassing 80% of the bowel observed with stigmata of recent bleeding. Started back on HD on 01/14/2023. He will need some kind of rehab whether inpatient rehab or skilled nursing facility placement.   Significant Events: 9/11 admitted with abdominal pain 9/12 CT demonstrating mesenteric occlusion, bowel resection and stent placed, to ICU on vent. Left in discontinuity  9/14 back to OR. Left in discontinuity as still concern about bowel integrity 9/16 back to OR  s sig areas of ischemia w/ 2 areas of perforation, another 66cm section of jejunum was resected and area of discontinuity was reanastomosed. Changed to argatroban post-op as unable to get heparin levels detectable  9/17 dropping RASS goal to 0 to -1, assessing for readiness to wean, serum creatinine and BUN still  the right IJ vein was accessed with Yassmin Binegar 21 gauge needle; the needle tip within the vein was confirmed with ultrasound image documentation. The needle exchanged over Lugene Beougher 018 guidewire for vascular dilator which allowed advancement of Keilani Terrance 16 cm Mahurkar catheter. This was positioned with the tip at the cavoatrial junction. Spot chest radiograph shows good positioning and no pneumothorax. Catheter was flushed and sutured externally with 0-Prolene sutures. Patient tolerated the procedure well. FLUOROSCOPY TIME:  Radiation Exposure Index (as provided by the fluoroscopic device): 1 mGy air Kerma COMPLICATIONS: COMPLICATIONS none IMPRESSION: 1. Technically successful right IJ Mahurkar catheter placement. Electronically Signed   By: Corlis Leak M.D.   On: 01/13/2023 09:29   IR US Guide Vasc Access Right  Result Date: 01/13/2023 CLINICAL DATA:  Renal failure, needs  venous access for dialysis EXAM: EXAM RIGHT IJ CATHETER PLACEMENT UNDER ULTRASOUND AND FLUOROSCOPIC GUIDANCE TECHNIQUE: The procedure, risks (including but not limited to bleeding, infection, organ damage, pneumothorax), benefits, and alternatives were explained to the patient. Questions regarding the procedure were encouraged and answered. The patient understands and consents to the procedure. Patency of the right IJ vein was confirmed with ultrasound with image documentation. An appropriate skin site was determined. Skin site was marked. Region was prepped using maximum barrier technique including cap and mask, sterile gown, sterile gloves, large sterile sheet, and Chlorhexidine as cutaneous antisepsis. The region was infiltrated locally with 1% lidocaine. Under real-time ultrasound guidance, the right IJ vein was accessed with Beniah Magnan 21 gauge needle; the needle tip within the vein was  confirmed with ultrasound image documentation. The needle exchanged over [place] Bury 018 guidewire for vascular dilator which allowed advancement of Sadiq Mccauley 16 cm Mahurkar catheter. This was positioned with the tip at the cavoatrial junction. Spot chest radiograph shows good positioning and no pneumothorax. Catheter was flushed and sutured externally with 0-Prolene sutures. Patient tolerated the procedure well. FLUOROSCOPY TIME:  Radiation Exposure Index (as provided by the fluoroscopic device): 1 mGy air Kerma COMPLICATIONS: COMPLICATIONS none IMPRESSION: 1. Technically successful right IJ Mahurkar catheter placement. Electronically Signed   By: Corlis Leak M.D.   On: 01/13/2023 09:29   DG Chest Port 1 View  Result Date: 01/09/2023 CLINICAL DATA:  Shortness of breath with abdominal pain for several days. History of hypertension. EXAM: PORTABLE CHEST 1 VIEW COMPARISON:  Radiographs 12/29/2022 and 12/23/2022. Chest CT 12/29/2022. FINDINGS: 1121 hours. Interval extubation and removal of the enteric tube. Right IJ central venous catheter has been removed. Left IJ central venous catheter appears unchanged, projecting to the mid SVC level. The heart size and mediastinal contours are stable. There are persistent low lung volumes. The lungs appear clear. No pleural effusion or pneumothorax. The bones appear unchanged, without acute findings. Telemetry leads overlie the chest. IMPRESSION: Interval extubation and removal of the enteric tube and right IJ central venous catheter. No acute cardiopulmonary process. Electronically Signed   By: Carey Bullocks M.D.   On: 01/09/2023 13:23   DG Swallowing Func-Speech Pathology  Result Date: 01/08/2023 Table formatting from the original result was not included. Modified Barium Swallow Study Patient Details Name: [name] [name] MRN: [id number] Date of Birth: 18-Sep-1955 Today's Date: 01/08/2023 HPI/PMH: HPI: Pt is [name] [name] 67 yo male presenting to ED from OP vascular surgery office with known mesenteric  artery stenosis s/p SMA angiogram. Admitted 9/11 with several week history of abdominal pain with 30 lb weight loss. W/u included mesenteric ischemia and necrotic area of jejunum s/p exploratory laparotomy and small bowel resection 9/12, reopening  Physician Discharge Summary  [name] [name] ZOX:[id number] DOB: Jul 25, 1955 DOA: 12/20/2022  PCP: [name], MD  Admit date: 12/20/2022 Discharge date: 01/24/2023  Time spent: 40 minutes  Recommendations for Outpatient and CIR Follow-up:  Follow CBC/CMP   Needs follow up with vascular outpatient Continue wet to dry dressing changes to midline abdominal wound per surgery Currently has tunneled dialysis catheter - renal to follow to determine whether it can be removed vs need for continued dialysis  Needs outpatient follow up with cardiology for atrial fibrillation Continue wound care for decubitus ulcer  4 mm pleural nodule, needs outpatient follow up   Discharge Diagnoses:  Principal Problem:   Superior mesenteric artery stenosis (HCC) Active Problems:   Hyperlipidemia   Essential hypertension   Hypoalbuminemia   GERD (gastroesophageal reflux disease)   Acute renal failure superimposed on stage 3a chronic kidney disease (HCC)   Mesenteric ischemia (HCC)   Endotracheal tube present   Protein-calorie malnutrition, severe   Acute respiratory failure (HCC)   Physical deconditioning   Hematochezia   Colon ulcer   Discharge Condition: stable  Diet recommendation: regular  Filed Weights   01/21/23 0500 01/23/23 0538 01/24/23 0500  Weight: 95.7 kg 96.7 kg 96.5 kg    History of present illness:   [name] [name] is an 67 y.o. 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  Abdominal pain continued reported weight loss and poor oral intake.  Admitted on 9/11 with concerns of superior mesenteric artery thrombosis was started on IV heparin infusion vascular surgery was consulted, CT angiogram of abdomen and pelvis was done on 12/21/2022  that showed acute occlusion of the distal SMA as well as the inferior mesenteric artery aching to the OR emergently and was found to have Kassim Guertin necrotic jejunum status post resection.  SMA stent was placed, patient left with an open wound and remained on mechanical ventilation postoperatively.  Had to be taken back to the OR several times as there was Ronniesha Seibold concern of bowel integrity 12/25/2022 there were perforations so further surgical resection was done.  She was also changed to argatroban post op.  Had to be dialyzed started on 12/27/2022, subsequently extubated on 12/31/2022.  Argatroban was held on 01/07/2023 due to rectal bleeding, transfused 2 units of packed red blood cells due to drop in hemoglobin of 6.1.,  Transferred to [facility] on 01/09/2023.  Transferred back to the ICU on 01/09/2023 due to rectal bleeding anticoagulation was held. GI was consulted on 01/10/2019 for endoscopy showed no signs of bleeding and colonoscopy deep ulceration encompassing 80% of the bowel observed with stigmata of recent bleeding. Started back on HD on 01/14/2023. He will need some kind of rehab whether inpatient rehab or skilled nursing facility placement.   Significant Events: 9/11 admitted with abdominal pain 9/12 CT demonstrating mesenteric occlusion, bowel resection and stent placed, to ICU on vent. Left in discontinuity  9/14 back to OR. Left in discontinuity as still concern about bowel integrity 9/16 back to OR  s sig areas of ischemia w/ 2 areas of perforation, another 66cm section of jejunum was resected and area of discontinuity was reanastomosed. Changed to argatroban post-op as unable to get heparin levels detectable  9/17 dropping RASS goal to 0 to -1, assessing for readiness to wean, serum creatinine and BUN still  the right IJ vein was accessed with Yassmin Binegar 21 gauge needle; the needle tip within the vein was confirmed with ultrasound image documentation. The needle exchanged over Lugene Beougher 018 guidewire for vascular dilator which allowed advancement of Keilani Terrance 16 cm Mahurkar catheter. This was positioned with the tip at the cavoatrial junction. Spot chest radiograph shows good positioning and no pneumothorax. Catheter was flushed and sutured externally with 0-Prolene sutures. Patient tolerated the procedure well. FLUOROSCOPY TIME:  Radiation Exposure Index (as provided by the fluoroscopic device): 1 mGy air Kerma COMPLICATIONS: COMPLICATIONS none IMPRESSION: 1. Technically successful right IJ Mahurkar catheter placement. Electronically Signed   By: Corlis Leak M.D.   On: 01/13/2023 09:29   IR US Guide Vasc Access Right  Result Date: 01/13/2023 CLINICAL DATA:  Renal failure, needs  venous access for dialysis EXAM: EXAM RIGHT IJ CATHETER PLACEMENT UNDER ULTRASOUND AND FLUOROSCOPIC GUIDANCE TECHNIQUE: The procedure, risks (including but not limited to bleeding, infection, organ damage, pneumothorax), benefits, and alternatives were explained to the patient. Questions regarding the procedure were encouraged and answered. The patient understands and consents to the procedure. Patency of the right IJ vein was confirmed with ultrasound with image documentation. An appropriate skin site was determined. Skin site was marked. Region was prepped using maximum barrier technique including cap and mask, sterile gown, sterile gloves, large sterile sheet, and Chlorhexidine as cutaneous antisepsis. The region was infiltrated locally with 1% lidocaine. Under real-time ultrasound guidance, the right IJ vein was accessed with Beniah Magnan 21 gauge needle; the needle tip within the vein was  confirmed with ultrasound image documentation. The needle exchanged over [place] Bury 018 guidewire for vascular dilator which allowed advancement of Sadiq Mccauley 16 cm Mahurkar catheter. This was positioned with the tip at the cavoatrial junction. Spot chest radiograph shows good positioning and no pneumothorax. Catheter was flushed and sutured externally with 0-Prolene sutures. Patient tolerated the procedure well. FLUOROSCOPY TIME:  Radiation Exposure Index (as provided by the fluoroscopic device): 1 mGy air Kerma COMPLICATIONS: COMPLICATIONS none IMPRESSION: 1. Technically successful right IJ Mahurkar catheter placement. Electronically Signed   By: Corlis Leak M.D.   On: 01/13/2023 09:29   DG Chest Port 1 View  Result Date: 01/09/2023 CLINICAL DATA:  Shortness of breath with abdominal pain for several days. History of hypertension. EXAM: PORTABLE CHEST 1 VIEW COMPARISON:  Radiographs 12/29/2022 and 12/23/2022. Chest CT 12/29/2022. FINDINGS: 1121 hours. Interval extubation and removal of the enteric tube. Right IJ central venous catheter has been removed. Left IJ central venous catheter appears unchanged, projecting to the mid SVC level. The heart size and mediastinal contours are stable. There are persistent low lung volumes. The lungs appear clear. No pleural effusion or pneumothorax. The bones appear unchanged, without acute findings. Telemetry leads overlie the chest. IMPRESSION: Interval extubation and removal of the enteric tube and right IJ central venous catheter. No acute cardiopulmonary process. Electronically Signed   By: Carey Bullocks M.D.   On: 01/09/2023 13:23   DG Swallowing Func-Speech Pathology  Result Date: 01/08/2023 Table formatting from the original result was not included. Modified Barium Swallow Study Patient Details Name: [name] [name] MRN: [id number] Date of Birth: 18-Sep-1955 Today's Date: 01/08/2023 HPI/PMH: HPI: Pt is [name] [name] 67 yo male presenting to ED from OP vascular surgery office with known mesenteric  artery stenosis s/p SMA angiogram. Admitted 9/11 with several week history of abdominal pain with 30 lb weight loss. W/u included mesenteric ischemia and necrotic area of jejunum s/p exploratory laparotomy and small bowel resection 9/12, reopening  Physician Discharge Summary  [name] [name] ZOX:[id number] DOB: Jul 25, 1955 DOA: 12/20/2022  PCP: [name], MD  Admit date: 12/20/2022 Discharge date: 01/24/2023  Time spent: 40 minutes  Recommendations for Outpatient and CIR Follow-up:  Follow CBC/CMP   Needs follow up with vascular outpatient Continue wet to dry dressing changes to midline abdominal wound per surgery Currently has tunneled dialysis catheter - renal to follow to determine whether it can be removed vs need for continued dialysis  Needs outpatient follow up with cardiology for atrial fibrillation Continue wound care for decubitus ulcer  4 mm pleural nodule, needs outpatient follow up   Discharge Diagnoses:  Principal Problem:   Superior mesenteric artery stenosis (HCC) Active Problems:   Hyperlipidemia   Essential hypertension   Hypoalbuminemia   GERD (gastroesophageal reflux disease)   Acute renal failure superimposed on stage 3a chronic kidney disease (HCC)   Mesenteric ischemia (HCC)   Endotracheal tube present   Protein-calorie malnutrition, severe   Acute respiratory failure (HCC)   Physical deconditioning   Hematochezia   Colon ulcer   Discharge Condition: stable  Diet recommendation: regular  Filed Weights   01/21/23 0500 01/23/23 0538 01/24/23 0500  Weight: 95.7 kg 96.7 kg 96.5 kg    History of present illness:   [name] [name] is an 67 y.o. 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  Abdominal pain continued reported weight loss and poor oral intake.  Admitted on 9/11 with concerns of superior mesenteric artery thrombosis was started on IV heparin infusion vascular surgery was consulted, CT angiogram of abdomen and pelvis was done on 12/21/2022  that showed acute occlusion of the distal SMA as well as the inferior mesenteric artery aching to the OR emergently and was found to have Kassim Guertin necrotic jejunum status post resection.  SMA stent was placed, patient left with an open wound and remained on mechanical ventilation postoperatively.  Had to be taken back to the OR several times as there was Ronniesha Seibold concern of bowel integrity 12/25/2022 there were perforations so further surgical resection was done.  She was also changed to argatroban post op.  Had to be dialyzed started on 12/27/2022, subsequently extubated on 12/31/2022.  Argatroban was held on 01/07/2023 due to rectal bleeding, transfused 2 units of packed red blood cells due to drop in hemoglobin of 6.1.,  Transferred to [facility] hospitalist on 01/09/2023.  Transferred back to the ICU on 01/09/2023 due to rectal bleeding anticoagulation was held. GI was consulted on 01/10/2019 for endoscopy showed no signs of bleeding and colonoscopy deep ulceration encompassing 80% of the bowel observed with stigmata of recent bleeding. Started back on HD on 01/14/2023. He will need some kind of rehab whether inpatient rehab or skilled nursing facility placement.   Significant Events: 9/11 admitted with abdominal pain 9/12 CT demonstrating mesenteric occlusion, bowel resection and stent placed, to ICU on vent. Left in discontinuity  9/14 back to OR. Left in discontinuity as still concern about bowel integrity 9/16 back to OR  s sig areas of ischemia w/ 2 areas of perforation, another 66cm section of jejunum was resected and area of discontinuity was reanastomosed. Changed to argatroban post-op as unable to get heparin levels detectable  9/17 dropping RASS goal to 0 to -1, assessing for readiness to wean, serum creatinine and BUN still  12/21/22 FINDINGS: Enteric tube courses below diaphragm with side hole and tip projecting over the expected location of the stomach. Paucity of bowel gas limits the ability to assess for obstruction, but within this limitation. No radio-opaque calculi or other significant radiographic abnormality are seen. Lung bases are clear IMPRESSION: Enteric tube courses below diaphragm with side hole and tip projecting over the expected location of the stomach. Electronically Signed   By: Lorenza Cambridge M.D.   On: 12/28/2022 12:24   VAS US RENAL ARTERY DUPLEX  Result Date: 12/26/2022 ABDOMINAL VISCERAL Patient Name:  [name] [name]   Date of Exam:   12/26/2022 Medical Rec #: [id number]      Accession #:    [id number] Date of Birth: Apr 12, 1955      Patient Gender: M Patient Age:   66 years Exam Location:  [facility] Procedure:      VAS US RENAL ARTERY DUPLEX Referring Phys: [id number] JOSHUA E ROBINS -------------------------------------------------------------------------------- Indications: Concerned for right renal artery occlusion. High Risk Factors: Hypertension, hyperlipidemia. Vascular Interventions: SMA stenting for acute mesenteric ischemia on 12/23/22. Limitations: Air/bowel gas, obesity and BANDAGES IN PLACE FROM SURGERY ON 12/25/22 - RE-EXPLORATION LAPAROTOMY W/CLOSURE (Abdomen) SMALL BOWEL RESECTION W/ANASTOMOSIS. Comparison Study: Renal duplex on 12/05/22 - Patent renal arteries bilaterally                   with small right kidney. Performing Technologist: Erin Hearing  Examination Guidelines: [place] complete evaluation includes B-mode imaging, spectral Doppler, color Doppler, and power Doppler as needed of all accessible portions of each vessel. Bilateral testing is considered an integral part of Kadon Andrus complete examination. Limited examinations for reoccurring indications may be performed as noted.  Duplex Findings:  Mesenteric Technologist observations: Aorta and mesenteric and celiac arteries not visualized due to s/p surgery.   +------------------+--------+--------+--------------+ Right Renal ArteryPSV cm/sEDV cm/s   Comment     +------------------+--------+--------+--------------+ Origin                            Not visualized +------------------+--------+--------+--------------+ Proximal             54      24                  +------------------+--------+--------+--------------+ Mid                  53      22                  +------------------+--------+--------+--------------+ Distal               64      29                  +------------------+--------+--------+--------------+  +-----------------+--------+--------+-------------------+ Left Renal ArteryPSV cm/sEDV cm/s      Comment       +-----------------+--------+--------+-------------------+ Origin              73      15   not well visualized +-----------------+--------+--------+-------------------+ Proximal            93      18                       +-----------------+--------+--------+-------------------+ Mid                 96      13                       +-----------------+--------+--------+-------------------+  12/21/22 FINDINGS: Enteric tube courses below diaphragm with side hole and tip projecting over the expected location of the stomach. Paucity of bowel gas limits the ability to assess for obstruction, but within this limitation. No radio-opaque calculi or other significant radiographic abnormality are seen. Lung bases are clear IMPRESSION: Enteric tube courses below diaphragm with side hole and tip projecting over the expected location of the stomach. Electronically Signed   By: Lorenza Cambridge M.D.   On: 12/28/2022 12:24   VAS US RENAL ARTERY DUPLEX  Result Date: 12/26/2022 ABDOMINAL VISCERAL Patient Name:  [name] [name]   Date of Exam:   12/26/2022 Medical Rec #: [id number]      Accession #:    [id number] Date of Birth: Apr 12, 1955      Patient Gender: M Patient Age:   66 years Exam Location:  [facility] Procedure:      VAS US RENAL ARTERY DUPLEX Referring Phys: [id number] JOSHUA E ROBINS -------------------------------------------------------------------------------- Indications: Concerned for right renal artery occlusion. High Risk Factors: Hypertension, hyperlipidemia. Vascular Interventions: SMA stenting for acute mesenteric ischemia on 12/23/22. Limitations: Air/bowel gas, obesity and BANDAGES IN PLACE FROM SURGERY ON 12/25/22 - RE-EXPLORATION LAPAROTOMY W/CLOSURE (Abdomen) SMALL BOWEL RESECTION W/ANASTOMOSIS. Comparison Study: Renal duplex on 12/05/22 - Patent renal arteries bilaterally                   with small right kidney. Performing Technologist: Erin Hearing  Examination Guidelines: [place] complete evaluation includes B-mode imaging, spectral Doppler, color Doppler, and power Doppler as needed of all accessible portions of each vessel. Bilateral testing is considered an integral part of Kadon Andrus complete examination. Limited examinations for reoccurring indications may be performed as noted.  Duplex Findings:  Mesenteric Technologist observations: Aorta and mesenteric and celiac arteries not visualized due to s/p surgery.   +------------------+--------+--------+--------------+ Right Renal ArteryPSV cm/sEDV cm/s   Comment     +------------------+--------+--------+--------------+ Origin                            Not visualized +------------------+--------+--------+--------------+ Proximal             54      24                  +------------------+--------+--------+--------------+ Mid                  53      22                  +------------------+--------+--------+--------------+ Distal               64      29                  +------------------+--------+--------+--------------+  +-----------------+--------+--------+-------------------+ Left Renal ArteryPSV cm/sEDV cm/s      Comment       +-----------------+--------+--------+-------------------+ Origin              73      15   not well visualized +-----------------+--------+--------+-------------------+ Proximal            93      18                       +-----------------+--------+--------+-------------------+ Mid                 96      13                       +-----------------+--------+--------+-------------------+

## 2023-01-24 NOTE — Plan of Care (Signed)
  Problem: RH BOWEL ELIMINATION Goal: RH STG MANAGE BOWEL WITH ASSISTANCE Description: STG Manage Bowel with min Assistance. Outcome: Not Progressing; on lomotil for loose stools per report

## 2023-01-24 NOTE — Progress Notes (Signed)
[facility] Progress Note   Assessment/ Plan:   Acute kidney injury on CKD IIIb: Baseline creatinine level around 2.0.  Acute kidney injury likely ischemic ATN due to ex lap surgery/IV contrast/hypotension  Started on HD 9/18 due to inability to wean from vent and worsening renal function.  He was then switched to CRRT on 9/20-24. Course complicated by GI bleed.  Started on HD on 10/5 after nontunneled catheter placement with IR on 10/4 Reconsulted IR for a tunneled catheter- placed 10/8, appreciate assistance Agree with not intervening on right RAS given that his right kidney is already much smaller than anticipated, discussed with VVS at that time. Likely not much function being contributed from right.   Strict ins and outs and daily weights Creatinine is now improved and not needing dialysis. Because of edema will start torsemide 40 mg daily If edema responds well and creatinine is stable likely remove dialysis catheter later this week Acute mesenteric ischemia: Status post small bowel resection and superior mesenteric artery stent placement.  Followed by vascular surgery.  S/p OR again 9/16 for exlap and further small bowel resection, abd closed Anemia due to acute blood loss: transfuse prn for hgb <7.  PRBC's per primary team. Multiple transfusions on 10/1.  S/p EGD and colonoscopy on 10/2.  Transfusions intermittently Acute GI bleed - s/p PRBC's and GI is following.  S/p EGD and colonoscopy as above on 10/2 with severe mucosal ulceration in the rectum and rectosigmoid colon (felt ischemic or infectious per GI) Severe protein malnutrition - nutrition per primary team; improving  Subjective:    Patient feels well today with no complaints.  He has noticed some lower extremity edema   Objective:   BP (!) 178/86 (BP Location: Left Arm)   Pulse 80   Temp 98.4 F (36.9 C) (Oral)   Resp 19   Ht 5\' 11"  (1.803 m)   Wt 96.5 kg Comment: Simultaneous filing. User may not have seen  previous data.  SpO2 96%   BMI 29.67 kg/m   Physical Exam: Gen:NAD, lying flat in bed CVS: normal rate Resp: Bilateral chest rise with no increased work of breathing Abd: soft, nondistended Ext: 1+ lower extremity edema ACCESS: tunneled internal jugular cathter R side   Labs: BMET Recent Labs  Lab 01/18/23 0431 01/19/23 0357 01/20/23 0639 01/21/23 0500 01/22/23 0629 01/23/23 0615 01/24/23 0520  NA 139 135 138 136 139 137 138  K 4.0 4.0 3.6 3.2* 3.5 3.1* 3.5  CL 95* 97* 101 99 100 101 104  CO2 31 25 23 27 25 25 23   GLUCOSE 115* 106* 119* 118* 96 91 94  BUN 127* 92* 91* 42* 39* 35* 32*  CREATININE 5.19* 4.22* 3.80* 2.19* 2.55* 2.09* 2.17*  CALCIUM 8.1* 7.9* 8.0* 7.5* 7.7* 7.6* 8.1*  PHOS 3.1 3.3 3.3 2.8  --   --   --    CBC Recent Labs  Lab 01/18/23 0431 01/19/23 0357 01/20/23 1659 01/21/23 0500 01/22/23 0629 01/23/23 0615  WBC 5.7   < > 6.6 6.4 6.3 5.7  NEUTROABS 4.4  --   --   --   --   --   HGB 7.3*   < > 8.6* 7.6* 7.4* 9.2*  HCT 22.1*   < > 25.9* 23.5* 23.2* 28.3*  MCV 90.9   < > 87.5 88.3 90.6 90.7  PLT 154   < > 147* 145* 140* 155   < > = values in this interval not displayed.  Medications:     amiodarone  200 mg Oral Daily   amLODipine  5 mg Oral Daily   Chlorhexidine Gluconate Cloth  6 each Topical Q0600   fiber supplement (BANATROL TF)  60 mL Oral BID   Gerhardt's butt cream   Topical BID   hydrALAZINE  50 mg Oral TID   leptospermum manuka honey  1 Application Topical Daily   multivitamin  1 tablet Oral QHS   pantoprazole (PROTONIX) IV  40 mg Intravenous Q12H   sodium chloride flush  3 mL Intravenous Q12H   tamsulosin  0.8 mg Oral QPC supper   torsemide  40 mg Oral Daily

## 2023-01-24 NOTE — Plan of Care (Signed)
  Problem: Health Behavior/Discharge Planning: Goal: Ability to manage health-related needs will improve Outcome: Progressing   Problem: Clinical Measurements: Goal: Ability to maintain clinical measurements within normal limits will improve Outcome: Progressing Goal: Will remain free from infection Outcome: Progressing Goal: Diagnostic test results will improve Outcome: Progressing Goal: Respiratory complications will improve Outcome: Progressing   

## 2023-01-25 ENCOUNTER — Inpatient Hospital Stay (HOSPITAL_COMMUNITY): Payer: Medicare Other

## 2023-01-25 DIAGNOSIS — R197 Diarrhea, unspecified: Secondary | ICD-10-CM

## 2023-01-25 DIAGNOSIS — I4891 Unspecified atrial fibrillation: Secondary | ICD-10-CM

## 2023-01-25 DIAGNOSIS — M623 Immobility syndrome (paraplegic): Secondary | ICD-10-CM

## 2023-01-25 DIAGNOSIS — E876 Hypokalemia: Secondary | ICD-10-CM

## 2023-01-25 DIAGNOSIS — H1031 Unspecified acute conjunctivitis, right eye: Secondary | ICD-10-CM

## 2023-01-25 LAB — CBC WITH DIFFERENTIAL/PLATELET
Abs Immature Granulocytes: 0.19 10*3/uL — ABNORMAL HIGH (ref 0.00–0.07)
Basophils Absolute: 0 10*3/uL (ref 0.0–0.1)
Basophils Relative: 1 %
Eosinophils Absolute: 0.2 10*3/uL (ref 0.0–0.5)
Eosinophils Relative: 3 %
HCT: 28.6 % — ABNORMAL LOW (ref 39.0–52.0)
Hemoglobin: 9.5 g/dL — ABNORMAL LOW (ref 13.0–17.0)
Immature Granulocytes: 3 %
Lymphocytes Relative: 9 %
Lymphs Abs: 0.6 10*3/uL — ABNORMAL LOW (ref 0.7–4.0)
MCH: 29.2 pg (ref 26.0–34.0)
MCHC: 33.2 g/dL (ref 30.0–36.0)
MCV: 88 fL (ref 80.0–100.0)
Monocytes Absolute: 0.5 10*3/uL (ref 0.1–1.0)
Monocytes Relative: 8 %
Neutro Abs: 4.7 10*3/uL (ref 1.7–7.7)
Neutrophils Relative %: 76 %
Platelets: 163 10*3/uL (ref 150–400)
RBC: 3.25 MIL/uL — ABNORMAL LOW (ref 4.22–5.81)
RDW: 14.7 % (ref 11.5–15.5)
WBC: 6.1 10*3/uL (ref 4.0–10.5)
nRBC: 0 % (ref 0.0–0.2)

## 2023-01-25 LAB — COMPREHENSIVE METABOLIC PANEL
ALT: 43 U/L (ref 0–44)
AST: 27 U/L (ref 15–41)
Albumin: 1.8 g/dL — ABNORMAL LOW (ref 3.5–5.0)
Alkaline Phosphatase: 62 U/L (ref 38–126)
Anion gap: 12 (ref 5–15)
BUN: 32 mg/dL — ABNORMAL HIGH (ref 8–23)
CO2: 24 mmol/L (ref 22–32)
Calcium: 8.4 mg/dL — ABNORMAL LOW (ref 8.9–10.3)
Chloride: 103 mmol/L (ref 98–111)
Creatinine, Ser: 2.37 mg/dL — ABNORMAL HIGH (ref 0.61–1.24)
GFR, Estimated: 29 mL/min — ABNORMAL LOW (ref >60)
Glucose, Bld: 111 mg/dL — ABNORMAL HIGH (ref 70–99)
Potassium: 3.3 mmol/L — ABNORMAL LOW (ref 3.5–5.1)
Sodium: 139 mmol/L (ref 135–145)
Total Bilirubin: 0.5 mg/dL (ref 0.3–1.2)
Total Protein: 4.8 g/dL — ABNORMAL LOW (ref 6.5–8.1)

## 2023-01-25 LAB — MAGNESIUM: Magnesium: 1.3 mg/dL — ABNORMAL LOW (ref 1.7–2.4)

## 2023-01-25 MED ORDER — POTASSIUM CHLORIDE CRYS ER 10 MEQ PO TBCR: 10.0000 meq | EXTENDED_RELEASE_TABLET | Freq: Two times a day (BID) | ORAL | Status: DC

## 2023-01-25 MED ORDER — ARTIFICIAL TEARS OPHTHALMIC OINT: TOPICAL_OINTMENT | OPHTHALMIC | Status: DC | PRN

## 2023-01-25 MED ORDER — CHLORHEXIDINE GLUCONATE CLOTH 2 % EX PADS
6.0000 | MEDICATED_PAD | Freq: Two times a day (BID) | CUTANEOUS | Status: DC
  Administered 2023-01-25 – 2023-01-29 (×8): 6 via TOPICAL

## 2023-01-25 MED ORDER — AMLODIPINE BESYLATE 5 MG PO TABS: 7.5000 mg | ORAL_TABLET | Freq: Every day | ORAL | Status: DC

## 2023-01-25 MED ORDER — MAGNESIUM SULFATE 2 GM/50ML IV SOLN
2.0000 g | Freq: Once | INTRAVENOUS | Status: AC
  Administered 2023-01-25: 2 g via INTRAVENOUS
  Filled 2023-01-25: qty 50

## 2023-01-25 MED ORDER — POTASSIUM CHLORIDE CRYS ER 20 MEQ PO TBCR
20.0000 meq | EXTENDED_RELEASE_TABLET | Freq: Two times a day (BID) | ORAL | Status: AC
  Administered 2023-01-25: 20 meq via ORAL
  Filled 2023-01-25: qty 1

## 2023-01-25 MED ORDER — ARTIFICIAL TEARS OPHTHALMIC OINT: TOPICAL_OINTMENT | Freq: Every evening | OPHTHALMIC | Status: DC | PRN

## 2023-01-25 MED ORDER — CALCIUM POLYCARBOPHIL 625 MG PO TABS
625.0000 mg | ORAL_TABLET | Freq: Two times a day (BID) | ORAL | Status: DC
  Administered 2023-01-25 – 2023-01-29 (×7): 625 mg via ORAL
  Filled 2023-01-25 (×8): qty 1

## 2023-01-25 MED ORDER — AMLODIPINE BESYLATE 5 MG PO TABS
5.0000 mg | ORAL_TABLET | Freq: Every day | ORAL | Status: DC
  Administered 2023-01-26 – 2023-01-29 (×4): 5 mg via ORAL
  Filled 2023-01-25 (×4): qty 1

## 2023-01-25 MED ORDER — POTASSIUM CHLORIDE CRYS ER 20 MEQ PO TBCR
30.0000 meq | EXTENDED_RELEASE_TABLET | Freq: Two times a day (BID) | ORAL | Status: DC
  Administered 2023-01-25: 30 meq via ORAL
  Filled 2023-01-25: qty 1

## 2023-01-25 NOTE — Progress Notes (Addendum)
Inpatient Rehabilitation Admission Medication Review by a Pharmacist  A complete drug regimen review was completed for this patient to identify any potential clinically significant medication issues.  High Risk Drug Classes Is patient taking? Indication by Medication  Antipsychotic No   Anticoagulant No   Antibiotic No   Opioid No   Antiplatelet No   Hypoglycemics/insulin No   Vasoactive Medication Yes Amiodarone- arhythmia  Norvasc, torsemide, hydralazine- HTN Flomax- BPH  Chemotherapy No   Other Yes Protonix- GERD Potassium chloride- K+ supplement     Type of Medication Issue Identified Description of Issue Recommendation(s)  Drug Interaction(s) (clinically significant)     Duplicate Therapy     Allergy     No Medication Administration End Date     Incorrect Dose     Additional Drug Therapy Needed     Significant med changes from prior encounter (inform family/care partners about these prior to discharge).    Other  PTA meds: Hygroton Catapres Norco Cozaar Crestor Viagra Testosterone  Restart PTA meds when and if necessary during CIR admission or at time of discharge, if warranted     Clinically significant medication issues were identified that warrant physician communication and completion of prescribed/recommended actions by midnight of the next day:  No    Time spent performing this drug regimen review (minutes): 40    Noah Delaine, [place] Clinical Pharmacist 01/25/2023 9:34 AM

## 2023-01-25 NOTE — Discharge Instructions (Signed)
Inpatient Rehab Discharge Instructions  [name] [name] Discharge date and time:    Activities/Precautions/ Functional Status: Activity: no lifting, driving, or strenuous exercise till cleared by MD Diet: regular diet Wound Care:    Functional status:  ___ No restrictions     ___ Walk up steps independently ___ 24/7 supervision/assistance   ___ Walk up steps with assistance ___ Intermittent supervision/assistance  ___ Bathe/dress independently ___ Walk with walker     ___ Bathe/dress with assistance ___ Walk Independently    ___ Shower independently ___ Walk with assistance    ___ Shower with assistance ___ No alcohol     ___ Return to work/school ________  Special Instructions:    My questions have been answered and I understand these instructions. I will adhere to these goals and the provided educational materials after my discharge from the hospital.  Patient/Caregiver Signature _______________________________ Date __________  Clinician Signature _______________________________________ Date __________  Please bring this form and your medication list with you to all your follow-up doctor's appointments.

## 2023-01-25 NOTE — Progress Notes (Signed)
Inpatient Rehabilitation  Patient information reviewed and entered into eRehab system by Oyuki Hogan M. Eri Mcevers, M.A., CCC/SLP, PPS Coordinator.  Information including medical coding, functional ability and quality indicators will be reviewed and updated through discharge.    

## 2023-01-25 NOTE — Progress Notes (Signed)
Patient ID: [name] [name], male   DOB: 12/12/1955, 67 y.o.   MRN: [id number] Met with the patient to review current situation, rehab schedule and team conference with plan of care. Reviewed secondary issues including CKD, HTN, A-flutter along with medications and dietary modification recommendations for nutritional needs and increased protein.  Patient noted slept well last pm with melatonin, voiding on his own, and no pain.  Irritation of right eye/conjunctivitis?; MD made aware.  Un-stageable to right buttocks and blisters on back with W-D dressing to abd incision twice a day. Continue to follow along to address educational needs for wound and skin care and facilitate preparation for discharge. Patient noted he has access to My Chart; wife is following. Pamelia Hoit

## 2023-01-25 NOTE — Plan of Care (Signed)
  Problem: RH Balance Goal: LTG Patient will maintain dynamic sitting balance (PT) Description: LTG:  Patient will maintain dynamic sitting balance with assistance during mobility activities (PT) Flowsheets (Taken 01/25/2023 1608) LTG: Pt will maintain dynamic sitting balance during mobility activities with:: Independent Goal: LTG Patient will maintain dynamic standing balance (PT) Description: LTG:  Patient will maintain dynamic standing balance with assistance during mobility activities (PT) Flowsheets (Taken 01/25/2023 1608) LTG: Pt will maintain dynamic standing balance during mobility activities with:: Independent with assistive device    Problem: Sit to Stand Goal: LTG:  Patient will perform sit to stand with assistance level (PT) Description: LTG:  Patient will perform sit to stand with assistance level (PT) Flowsheets (Taken 01/25/2023 1608) LTG: PT will perform sit to stand in preparation for functional mobility with assistance level: Independent with assistive device   Problem: RH Bed Mobility Goal: LTG Patient will perform bed mobility with assist (PT) Description: LTG: Patient will perform bed mobility with assistance, with/without cues (PT). Flowsheets (Taken 01/25/2023 1608) LTG: Pt will perform bed mobility with assistance level of: Independent with assistive device    Problem: RH Bed to Chair Transfers Goal: LTG Patient will perform bed/chair transfers w/assist (PT) Description: LTG: Patient will perform bed to chair transfers with assistance (PT). Flowsheets (Taken 01/25/2023 1608) LTG: Pt will perform Bed to Chair Transfers with assistance level: Independent with assistive device    Problem: RH Ambulation Goal: LTG Patient will ambulate in controlled environment (PT) Description: LTG: Patient will ambulate in a controlled environment, # of feet with assistance (PT). Flowsheets (Taken 01/25/2023 1608) LTG: Pt will ambulate in controlled environ  assist needed::  Independent with assistive device LTG: Ambulation distance in controlled environment: 150 feet with LRAD Goal: LTG Patient will ambulate in home environment (PT) Description: LTG: Patient will ambulate in home environment, # of feet with assistance (PT). Flowsheets (Taken 01/25/2023 1608) LTG: Pt will ambulate in home environ  assist needed:: Independent with assistive device LTG: Ambulation distance in home environment: 75 feet with LRAD

## 2023-01-25 NOTE — Progress Notes (Signed)
Bilateral lower extremity venous duplex has been completed. Preliminary results can be found in CV Proc through chart review.  Results were given to Delle Reining PA.  01/25/23 4:27 PM Olen Cordial RVT

## 2023-01-25 NOTE — Progress Notes (Signed)
Inpatient Rehabilitation Care Coordinator Assessment and Plan Patient Details  Name: [name] [name] MRN: [id number] Date of Birth: 10/18/1955  Today's Date: 01/25/2023  Hospital Problems: Principal Problem:   Debility  Past Medical History:  Past Medical History:  Diagnosis Date   Ankle fracture    Cancer (HCC) 1974   testicular cancer-at age 67   Cataract    bilateral sx   GERD (gastroesophageal reflux disease)    hx of   Hx of colonic polyps    pt unsure,thinks this was done in 1982 in [place]. no reports in EPIC   Hyperlipidemia    on meds   Hypertension    on meds   Kidney stones    Patella fracture    Retinal detachment    OD   Retinal tear of right eye    Syncopal episodes 2008   Past Surgical History:  Past Surgical History:  Procedure Laterality Date   AIR/FLUID EXCHANGE Right 06/16/2014   Procedure: AIR/FLUID EXCHANGE;  Surgeon: Sherrie George, MD;  Location: [facility] OR;  Service: Ophthalmology;  Laterality: Right;   ANGIOPLASTY Left 12/21/2022   Procedure: BALLOON ANGIOPLASTY SUPERIOR MESENTERIC ARTERY;  Surgeon: Victorino Sparrow, MD;  Location: [facility] OR;  Service: Vascular;  Laterality: Left;   APPLICATION OF WOUND VAC N/A 12/21/2022   Procedure: APPLICATION OF ABTHERA WOUND VAC;  Surgeon: Violeta Gelinas, MD;  Location: [facility] OR;  Service: General;  Laterality: N/A;   BIOPSY  04/04/2021   Procedure: BIOPSY;  Surgeon: Lemar Lofty., MD;  Location: [facility] ENDOSCOPY;  Service: Gastroenterology;;   BIOPSY  01/10/2023   Procedure: BIOPSY;  Surgeon: Beverley Fiedler, MD;  Location: [facility] ENDOSCOPY;  Service: Gastroenterology;;   BOWEL RESECTION  12/21/2022   Procedure: SMALL BOWEL RESECTION;  Surgeon: Violeta Gelinas, MD;  Location: [facility] OR;  Service: General;;   BOWEL RESECTION N/A 12/25/2022   Procedure: SMALL BOWEL RESECTION W/ANASTOMOSIS X2;  Surgeon: Gaynelle Adu, MD;  Location: [facility] OR;  Service: General;  Laterality: N/A;   CATARACT EXTRACTION     CATARACT EXTRACTION  09/2014    rt eye//left 01/2015   CATARACT EXTRACTION, BILATERAL  2016   per Dr. Elmer Picker    COLONOSCOPY  02/16/2020   per Dr. Rhea Belton, adenomatous polyps, repeat in 3 yrs   COLONOSCOPY N/A 01/10/2023   Procedure: COLONOSCOPY;  Surgeon: Beverley Fiedler, MD;  Location: [facility] ENDOSCOPY;  Service: Gastroenterology;  Laterality: N/A;   ENTEROSCOPY N/A 04/04/2021   Procedure: ENTEROSCOPY;  Surgeon: Meridee Score Netty Starring., MD;  Location: [facility] ENDOSCOPY;  Service: Gastroenterology;  Laterality: N/A;   ESOPHAGOGASTRODUODENOSCOPY (EGD) WITH PROPOFOL N/A 01/10/2023   Procedure: ESOPHAGOGASTRODUODENOSCOPY (EGD) WITH PROPOFOL;  Surgeon: Beverley Fiedler, MD;  Location: [facility] ENDOSCOPY;  Service: Gastroenterology;  Laterality: N/A;   EYE SURGERY     GAS INSERTION Right 06/16/2014   Procedure: INSERTION OF GAS;  Surgeon: Sherrie George, MD;  Location: [facility] OR;  Service: Ophthalmology;  Laterality: Right;  [id number]   GIVENS CAPSULE STUDY N/A 04/02/2021   Procedure: GIVENS CAPSULE STUDY;  Surgeon: Lemar Lofty., MD;  Location: [facility] ENDOSCOPY;  Service: Gastroenterology;  Laterality: N/A;   HYDROCELE EXCISION     INSERTION OF ILIAC STENT N/A 12/21/2022   Procedure: INSERTION OF SUPERIOR MESENTERIC ARTERY STENT;  Surgeon: Victorino Sparrow, MD;  Location: [facility] OR;  Service: Vascular;  Laterality: N/A;   IR ANGIOGRAM VISCERAL SELECTIVE  04/04/2021   IR FLUORO GUIDE CV LINE RIGHT  01/12/2023   IR FLUORO GUIDE CV  LINE RIGHT  01/16/2023   IR US GUIDE VASC ACCESS LEFT  04/04/2021   IR US GUIDE VASC ACCESS RIGHT  01/12/2023   KNEE ARTHROSCOPY Right    x 2   LAPAROTOMY N/A 12/21/2022   Procedure: EXPLORATION LAPAROTOMY;  Surgeon: Violeta Gelinas, MD;  Location: [facility] OR;  Service: General;  Laterality: N/A;   LAPAROTOMY N/A 12/23/2022   Procedure: RE-EXPLORATION LAPAROTOMY ABTHERA VAC EXCHANGE;  Surgeon: Almond Lint, MD;  Location: [facility] OR;  Service: General;  Laterality: N/A;   LAPAROTOMY N/A 12/25/2022   Procedure: RE-EXPLORATION LAPAROTOMY  W/CLOSURE;  Surgeon: Gaynelle Adu, MD;  Location: [facility] OR;  Service: General;  Laterality: N/A;   LASER PHOTO ABLATION Right 06/16/2014   Procedure: LASER PHOTO ABLATION;  Surgeon: Sherrie George, MD;  Location: [facility] OR;  Service: Ophthalmology;  Laterality: Right;  Headscope laser and endolaser    ORCHIECTOMY     right testicle   REPAIR OF COMPLEX TRACTION RETINAL DETACHMENT Right 06/16/2014   Procedure: REPAIR OF COMPLEX TRACTION RETINAL DETACHMENT;  Surgeon: Sherrie George, MD;  Location: [facility] OR;  Service: Ophthalmology;  Laterality: Right;   RETINAL DETACHMENT SURGERY  06/2014   SUBMUCOSAL TATTOO INJECTION  04/04/2021   Procedure: SUBMUCOSAL TATTOO INJECTION;  Surgeon: Lemar Lofty., MD;  Location: [facility] ENDOSCOPY;  Service: Gastroenterology;;   TONSILLECTOMY     ULTRASOUND GUIDANCE FOR VASCULAR ACCESS Left 12/21/2022   Procedure: ULTRASOUND GUIDANCE FOR VASCULAR ACCESS FEMORAL ARTER;  Surgeon: Victorino Sparrow, MD;  Location: [facility] OR;  Service: Vascular;  Laterality: Left;   VISCERAL ANGIOGRAM Left 12/21/2022   Procedure: SUPERIOR MESENTERIC ARTERY ANGIOGRAM;  Surgeon: Victorino Sparrow, MD;  Location: [facility] OR;  Service: Vascular;  Laterality: Left;   VITRECTOMY Right 06/16/2014   WISDOM TOOTH EXTRACTION     Social History:  reports that he has never smoked. He has never used smokeless tobacco. He reports that he does not currently use alcohol. He reports that he does not use drugs.  Family / Support Systems Marital Status: Married Patient Roles: Spouse, Other (Comment) (friend/church member) Spouse/Significant Other: Synetta Fail [phone] Other Supports: Friends and church members Anticipated Caregiver: Synetta Fail Ability/Limitations of Caregiver: Wife has no limitations Caregiver Availability: 24/7 Family Dynamics: Close knit with family and community supports. Pt feels he has good supports and is hopeful he will be mod/i by discharge  Social History Preferred language: English Religion:  Baptist Cultural Background: No issues Education: Charity fundraiser - How often do you need to have someone help you when you read instructions, pamphlets, or other written material from your doctor or pharmacy?: Never Writes: Yes Employment Status: Retired Marine scientist Issues: No issues Guardian/Conservator: None-according to MD pt is capable of making his own decisions while here. Wife comes daily to provide support   Abuse/Neglect Abuse/Neglect Assessment Can Be Completed: Yes Physical Abuse: Denies Verbal Abuse: Denies Sexual Abuse: Denies Exploitation of patient/patient's resources: Denies Self-Neglect: Denies  Patient response to: Social Isolation - How often do you feel lonely or isolated from those around you?: Never  Emotional Status Pt's affect, behavior and adjustment status: Pt is motivated to do well and regain his independence he is decondtioned and needs to work on this along with his BP issues he is having. He reminds himself to be patient due to this recovery will take time. He has always been able to take care of himself and hopes to get back to this level. Recent Psychosocial Issues: other health issues Psychiatric History: No history seesm  to be coping appropraitely and able to verbalize his feelings and concerns. Will get input from team to see if needs to be seen by neuro-psych Substance Abuse History: No issues  Patient / Family Perceptions, Expectations & Goals Pt/Family understanding of illness & functional limitations: Pt can explain his surgeries and medical issues he does talk with the MD daily and feels he understands his treatment plan moving forward. He does want to be kept updated on them and if anything changes. MD/PA aware of this Premorbid pt/family roles/activities: husband, retiree, friend, church member, etc Anticipated changes in roles/activities/participation: resume Pt/family expectations/goals: Pt states: " I hope to get my  blood pressure under control and be moving better before I leave here. I know I'm just beginning here."  [employer]: None Premorbid Home Care/DME Agencies: Other (Comment) (has grab bars at [employer] and has Dad's rw if needed) Transportation available at discharge: self and wife Is the patient able to respond to transportation needs?: Yes In the past 12 months, has lack of transportation kept you from medical appointments or from getting medications?: No In the past 12 months, has lack of transportation kept you from meetings, work, or from getting things needed for daily living?: No  Discharge Planning Living Arrangements: Spouse/significant other Support Systems: Spouse/significant other, Friends/neighbors, Psychologist, clinical community Type of Residence: Private residence Insurance Resources: [employer], Media planner (specify) ([employer] supplment) Financial Resources: Tree surgeon, Family Support Financial Screen Referred: No Living Expenses: Own Money Management: Patient, Spouse Does the patient have any problems obtaining your medications?: No Home Management: both Patient/Family Preliminary Plans: Return home with wife who is in good health and can assist if needed. She is here daily and supportive of husband. Aware being evaluated today and goals being set for his stay here. Care Coordinator Anticipated Follow Up Needs: HH/OP  Clinical Impression Pleasant gentleman who is motivated to do well here and regain his independence. His wife is supportive and will be his caregiver if needed at discharge. Will await evaluations and work on discharge needs.   Lucy Chris 01/25/2023, 10:55 AM

## 2023-01-25 NOTE — Plan of Care (Signed)
  Problem: RH Balance Goal: LTG: Patient will maintain dynamic sitting balance (OT) Description: LTG:  Patient will maintain dynamic sitting balance with assistance during activities of daily living (OT) Flowsheets (Taken 01/25/2023 1200) LTG: Pt will maintain dynamic sitting balance during ADLs with: Independent with assistive device Goal: LTG Patient will maintain dynamic standing with ADLs (OT) Description: LTG:  Patient will maintain dynamic standing balance with assist during activities of daily living (OT)  Flowsheets (Taken 01/25/2023 1200) LTG: Pt will maintain dynamic standing balance during ADLs with: Independent with assistive device   Problem: Sit to Stand Goal: LTG:  Patient will perform sit to stand in prep for activites of daily living with assistance level (OT) Description: LTG:  Patient will perform sit to stand in prep for activites of daily living with assistance level (OT) Flowsheets (Taken 01/25/2023 1200) LTG: PT will perform sit to stand in prep for activites of daily living with assistance level: Independent with assistive device   Problem: RH Grooming Goal: LTG Patient will perform grooming w/assist,cues/equip (OT) Description: LTG: Patient will perform grooming with assist, with/without cues using equipment (OT) Flowsheets (Taken 01/25/2023 1200) LTG: Pt will perform grooming with assistance level of: Independent with assistive device    Problem: RH Bathing Goal: LTG Patient will bathe all body parts with assist levels (OT) Description: LTG: Patient will bathe all body parts with assist levels (OT) Flowsheets (Taken 01/25/2023 1200) LTG: Pt will perform bathing with assistance level/cueing: Independent with assistive device    Problem: RH Dressing Goal: LTG Patient will perform upper body dressing (OT) Description: LTG Patient will perform upper body dressing with assist, with/without cues (OT). Flowsheets (Taken 01/25/2023 1200) LTG: Pt will perform upper  body dressing with assistance level of: Independent with assistive device Goal: LTG Patient will perform lower body dressing w/assist (OT) Description: LTG: Patient will perform lower body dressing with assist, with/without cues in positioning using equipment (OT) Flowsheets (Taken 01/25/2023 1200) LTG: Pt will perform lower body dressing with assistance level of: Independent with assistive device   Problem: RH Toilet Transfers Goal: LTG Patient will perform toilet transfers w/assist (OT) Description: LTG: Patient will perform toilet transfers with assist, with/without cues using equipment (OT) Flowsheets (Taken 01/25/2023 1200) LTG: Pt will perform toilet transfers with assistance level of: Independent with assistive device   Problem: RH Tub/Shower Transfers Goal: LTG Patient will perform tub/shower transfers w/assist (OT) Description: LTG: Patient will perform tub/shower transfers with assist, with/without cues using equipment (OT) Flowsheets (Taken 01/25/2023 1200) LTG: Pt will perform tub/shower stall transfers with assistance level of: Supervision/Verbal cueing

## 2023-01-25 NOTE — Evaluation (Addendum)
Occupational Therapy Assessment and Plan  Patient Details  Name: [name] [name] MRN: [id number] Date of Birth: 1956/01/29  OT Diagnosis: abnormal posture, muscle weakness (generalized), and swelling of limb Rehab Potential:   ELOS:     Today's Date: 01/25/2023 OT Individual Time: 4098-1191 OT Individual Time Calculation (min): 70 min     Hospital Problem: Principal Problem:   Debility   Past Medical History:  Past Medical History:  Diagnosis Date   Ankle fracture    Cancer (HCC) 1974   testicular cancer-at age 62   Cataract    bilateral sx   GERD (gastroesophageal reflux disease)    hx of   Hx of colonic polyps    pt unsure,thinks this was done in 1982 in [place]. no reports in EPIC   Hyperlipidemia    on meds   Hypertension    on meds   Kidney stones    Patella fracture    Retinal detachment    OD   Retinal tear of right eye    Syncopal episodes 2008   Past Surgical History:  Past Surgical History:  Procedure Laterality Date   AIR/FLUID EXCHANGE Right 06/16/2014   Procedure: AIR/FLUID EXCHANGE;  Surgeon: Sherrie George, MD;  Location: [facility] OR;  Service: Ophthalmology;  Laterality: Right;   ANGIOPLASTY Left 12/21/2022   Procedure: BALLOON ANGIOPLASTY SUPERIOR MESENTERIC ARTERY;  Surgeon: Victorino Sparrow, MD;  Location: [facility] OR;  Service: Vascular;  Laterality: Left;   APPLICATION OF WOUND VAC N/A 12/21/2022   Procedure: APPLICATION OF ABTHERA WOUND VAC;  Surgeon: Violeta Gelinas, MD;  Location: [facility] OR;  Service: General;  Laterality: N/A;   BIOPSY  04/04/2021   Procedure: BIOPSY;  Surgeon: Lemar Lofty., MD;  Location: [facility] ENDOSCOPY;  Service: Gastroenterology;;   BIOPSY  01/10/2023   Procedure: BIOPSY;  Surgeon: Beverley Fiedler, MD;  Location: [facility] ENDOSCOPY;  Service: Gastroenterology;;   BOWEL RESECTION  12/21/2022   Procedure: SMALL BOWEL RESECTION;  Surgeon: Violeta Gelinas, MD;  Location: [facility] OR;  Service: General;;   BOWEL RESECTION N/A 12/25/2022   Procedure:  SMALL BOWEL RESECTION W/ANASTOMOSIS X2;  Surgeon: Gaynelle Adu, MD;  Location: [facility] OR;  Service: General;  Laterality: N/A;   CATARACT EXTRACTION     CATARACT EXTRACTION  09/2014   rt eye//left 01/2015   CATARACT EXTRACTION, BILATERAL  2016   per Dr. Elmer Picker    COLONOSCOPY  02/16/2020   per Dr. Rhea Belton, adenomatous polyps, repeat in 3 yrs   COLONOSCOPY N/A 01/10/2023   Procedure: COLONOSCOPY;  Surgeon: Beverley Fiedler, MD;  Location: [facility] ENDOSCOPY;  Service: Gastroenterology;  Laterality: N/A;   ENTEROSCOPY N/A 04/04/2021   Procedure: ENTEROSCOPY;  Surgeon: Meridee Score Netty Starring., MD;  Location: [facility] ENDOSCOPY;  Service: Gastroenterology;  Laterality: N/A;   ESOPHAGOGASTRODUODENOSCOPY (EGD) WITH PROPOFOL N/A 01/10/2023   Procedure: ESOPHAGOGASTRODUODENOSCOPY (EGD) WITH PROPOFOL;  Surgeon: Beverley Fiedler, MD;  Location: [facility] ENDOSCOPY;  Service: Gastroenterology;  Laterality: N/A;   EYE SURGERY     GAS INSERTION Right 06/16/2014   Procedure: INSERTION OF GAS;  Surgeon: Sherrie George, MD;  Location: [facility] OR;  Service: Ophthalmology;  Laterality: Right;  [id number]   GIVENS CAPSULE STUDY N/A 04/02/2021   Procedure: GIVENS CAPSULE STUDY;  Surgeon: Lemar Lofty., MD;  Location: [facility] ENDOSCOPY;  Service: Gastroenterology;  Laterality: N/A;   HYDROCELE EXCISION     INSERTION OF ILIAC STENT N/A 12/21/2022   Procedure: INSERTION OF SUPERIOR MESENTERIC ARTERY STENT;  Surgeon: Victorino Sparrow, MD;  Location: [facility] OR;  Service: Vascular;  Laterality: N/A;   IR ANGIOGRAM VISCERAL SELECTIVE  04/04/2021   IR FLUORO GUIDE CV LINE RIGHT  01/12/2023   IR FLUORO GUIDE CV LINE RIGHT  01/16/2023   IR US GUIDE VASC ACCESS LEFT  04/04/2021   IR US GUIDE VASC ACCESS RIGHT  01/12/2023   KNEE ARTHROSCOPY Right    x 2   LAPAROTOMY N/A 12/21/2022   Procedure: EXPLORATION LAPAROTOMY;  Surgeon: Violeta Gelinas, MD;  Location: [facility] OR;  Service: General;  Laterality: N/A;   LAPAROTOMY N/A 12/23/2022   Procedure: RE-EXPLORATION  LAPAROTOMY ABTHERA VAC EXCHANGE;  Surgeon: Almond Lint, MD;  Location: [facility] OR;  Service: General;  Laterality: N/A;   LAPAROTOMY N/A 12/25/2022   Procedure: RE-EXPLORATION LAPAROTOMY W/CLOSURE;  Surgeon: Gaynelle Adu, MD;  Location: [facility] OR;  Service: General;  Laterality: N/A;   LASER PHOTO ABLATION Right 06/16/2014   Procedure: LASER PHOTO ABLATION;  Surgeon: Sherrie George, MD;  Location: [facility] OR;  Service: Ophthalmology;  Laterality: Right;  Headscope laser and endolaser    ORCHIECTOMY     right testicle   REPAIR OF COMPLEX TRACTION RETINAL DETACHMENT Right 06/16/2014   Procedure: REPAIR OF COMPLEX TRACTION RETINAL DETACHMENT;  Surgeon: Sherrie George, MD;  Location: [facility] OR;  Service: Ophthalmology;  Laterality: Right;   RETINAL DETACHMENT SURGERY  06/2014   SUBMUCOSAL TATTOO INJECTION  04/04/2021   Procedure: SUBMUCOSAL TATTOO INJECTION;  Surgeon: Lemar Lofty., MD;  Location: [facility] ENDOSCOPY;  Service: Gastroenterology;;   TONSILLECTOMY     ULTRASOUND GUIDANCE FOR VASCULAR ACCESS Left 12/21/2022   Procedure: ULTRASOUND GUIDANCE FOR VASCULAR ACCESS FEMORAL ARTER;  Surgeon: Victorino Sparrow, MD;  Location: [facility] OR;  Service: Vascular;  Laterality: Left;   VISCERAL ANGIOGRAM Left 12/21/2022   Procedure: SUPERIOR MESENTERIC ARTERY ANGIOGRAM;  Surgeon: Victorino Sparrow, MD;  Location: [facility] OR;  Service: Vascular;  Laterality: Left;   VITRECTOMY Right 06/16/2014   WISDOM TOOTH EXTRACTION      Assessment & Plan Clinical Impression: [name] [name] is a 67 year old male with history of CKD, HTN, retinal tear R-eye, testicular CA, small bowel GIST s/p resection 2022, multiple episodes of upper and lower GIB, known mesenteric stenosis who started developing abdominal pain with inability to eat or drink or 3-4 weeks, 30 lbs wt loss and buttock pain with claudication and  was admitted from [facility] office on 12/20/22 due to concerns of  embolic event leading to progression of SMA  and right sided iliac  stenosis v/s  occlusion. He was started on IV heparin and CTA showed SMA thrombus w/plaque with recommendations of vascular intervention for bowel ischemia. He was taken to OR on 12/21/22 for exploratory lap with resection of necrotic area of jejunum and ABTHERA for open abdomen by Dr. Janee Morn and insertion of SMA stent by Dr. Lenell Antu. Post op with VDRF and required return to OR on 09/14 for reexploration with removal of 70 cm potentially compromised bowel followed by re-exploration on 09/16. He developed hypotension requiring pressors, had oliguric AKI due to ATN and required CRRT 09/22-09/24. He was started on TNA for nutritional support and had difficulty with extubation due to  poor mental status off sedation.    He developed significant hypotension on 09/20, he was found to have blood per NGT and rectum as well as abdominal distension with leucocytosis concerning for bowel ischemia. Repeat CTA showed patent SMA without signs of ischemia, he was transfused with on unit PRBC and antibiotics  coverage broadened. He was extubated on 09/22 and required prn haldol for delirium. Mentation slowly improved. NGT removed on 09/27 but kept NPO due to signs of dysphagia. Anticoagulation and antiplatelet resumed  but he started having intermittent bloody BMs with clots on 09/28 with steady drop in Hgb to 6.0  on 09/30 despite discontinuation of Argatroban but ASA kept on board due to stent. He continued to have significant bleeding with hypotension and A flutter later that evening and was started on amiodarone for rate control as well as multiple units PRBC as well as DDAVP. ASA d/c per Dr. Rhea Belton.   On 10/02, he underwent endoscopy revealing 2 cm HH, erythematous mucosa in gastric body without bleeding, no ulcers and  duodenal angulation. Flex sig revealed severely ulcerated mucosa with stigmata of bleeding in rectosigmoid colon and 80% of rectum as well as scattered apthous ulcers in distal sigmoid colon--biopsy done  which showed inflammatory changes c/w ischemia treated with max supportive care with addition units of blood. Bleeding had resolved by 10/03 but he continued to have worsening of renal status with rise in BUN/SCr to 160/4.05 with volume overload therefore internal jugular placed by Dr Deanne Coffer and underwent HD required HD 10/06-   He did continue to require intermittent transfusions to keep Hb> 7.0 and to hold off anticoagulation/antiplatelet given risk of bleeding outweighs benefits. Did have episode of maroon stool on 10/08 with stable Hgb. He was started on diet and TNA weaned off. Intake remains poor with multiple supplements ongoing and flexiseal ordered for diarrhea management.  DTI bilateral buttocks being followed by WOC with medihoney for debridement of nonviable tissue. Wet to dry dressing to midline incision ongoing. Renal status improving with increase in UOP and HD held since 10/14. Torsemide added to help manage edema. Foley removed but has required I/O caths with volumes 400-600 cc due to retention. Flomax added and titrated up to 0.8 mg yesterday.  He continues to have buttock pain, ongoing issues with frequent loose stools and fatigue with standing attempts. Dizziness has resolved but he's significantly deconditioned. CIR recommended due to functional decline. Currently ambulating 20 feet CG.    Patient currently requires mod with basic self-care skills and IADL secondary to muscle weakness, decreased cardiorespiratoy endurance, decreased coordination, and decreased sitting balance, decreased standing balance, decreased postural control, and decreased balance strategies.  Prior to hospitalization, patient could complete all aspects of ADL/IADL and driving with independent  level.   Patient will benefit from skilled intervention to decrease level of assist with basic self-care skills, increase independence with basic self-care skills, and increase level of independence with iADL prior to  discharge home with care partner.  Anticipate patient will require 24 hour supervision and no further OT follow recommended.  OT - End of Session Endurance Deficit: Yes    01/25/23 1200  OT - End of Session  Activity Tolerance Decreased this session  Endurance Deficit Yes  OT Assessment  Rehab Potential (ACUTE ONLY) Good  OT Barriers to Discharge Home environment access/layout;Wound Care  OT Barriers to Discharge Comments wounds, 1st fl may be narrow doors  OT Patient demonstrates impairments in the following area(s) Balance;Nutrition;Pain;Safety;Endurance;Motor;Skin Integrity  OT Basic ADL's Functional Problem(s) Grooming;Bathing;Dressing;Toileting  OT Advanced ADL's Functional Problem(s) Simple Meal Preparation;Laundry;Light Housekeeping  OT Transfers Functional Problem(s) Toilet;Tub/Shower  OT Additional Impairment(s) None  OT Plan  OT Intensity Minimum of 1-2 x/day, 45 to 90 minutes  OT Frequency 5 out of 7 days  OT Self Feeding Anticipated Outcome(s) indep  OT Basic Self-Care Anticipated Outcome(s) mod I  OT Toileting Anticipated Outcome(s) mod I  OT Bathroom Transfers Anticipated Outcome(s) Supervision  OT Recommendation  Recommendations for Other Services Neuropsych consult;Therapeutic Recreation consult  Therapeutic Recreation Interventions Stress management;Outing/community reintergration  Patient destination Home  Follow Up Recommendations Home health OT;Outpatient OT  Equipment Recommended 3 in 1 bedside comode;Tub/shower seat  Equipment Details TBD  Individuals Consulted  Consulted and Agree with Results and Recommendations Patient    OT Evaluation Precautions/Restrictions  Precautions Precautions: Fall;Other (comment) Precaution Comments: abdominal incision Restrictions Weight Bearing Restrictions: No Pain Pain Assessment Pain Scale: 0-10 Pain Score: 0-No pain Home Living/Prior Functioning Home Living Family/patient expects to be discharged to:: Private  residence Living Arrangements: Spouse/significant other Available Help at Discharge: Family, Available 24 hours/day Type of Home: House Home Access: Level entry (pt reports no step through garage) Entrance Stairs-Number of Steps: 1 Home Layout: Two level, Able to live on main level with bedroom/bathroom Bathroom Shower/Tub: Health visitor: Handicapped height Bathroom Accessibility: Yes  Lives With: Spouse Prior Function Level of Independence: Independent with basic ADLs, Independent with gait, Independent with transfers, Independent with homemaking with ambulation  Able to Take Stairs?: Yes Driving: Yes Vocation: Retired Administrator, sports Baseline Vision/History: 0 No visual deficits Ability to See in Adequate Light: 0 Adequate Patient Visual Report: No change from baseline Vision Assessment?: No apparent visual deficits Perception  Perception: Within Functional Limits Praxis Praxis: WFL Cognition Cognition Overall Cognitive Status: Within Functional Limits for tasks assessed Arousal/Alertness: Awake/alert Orientation Level: Person;Place;Situation Memory: Appears intact Awareness: Appears intact Problem Solving: Appears intact Safety/Judgment: Appears intact Comments: mild decreased overall processing Brief Interview for Mental Status (BIMS) Repetition of Three Words (First Attempt): 3 Temporal Orientation: Year: Correct Temporal Orientation: Month: Accurate within 5 days Temporal Orientation: Day: Correct Recall: "Sock": Yes, no cue required Recall: "Blue": Yes, no cue required Recall: "Bed": Yes, no cue required BIMS Summary Score: 15 Sensation Sensation Light Touch: Appears Intact Hot/Cold: Appears Intact Proprioception: Appears Intact Stereognosis: Appears Intact Coordination Gross Motor Movements are Fluid and Coordinated: No Fine Motor Movements are Fluid and Coordinated: No Coordination and Movement Description: limited by  fatigue/debility/weakness Finger Nose Finger Test: diminished 9 Hole Peg Test: TBA Motor  Motor Motor: Other (comment) Motor - Skilled Clinical Observations: debility  Trunk/Postural Assessment  Cervical Assessment Cervical Assessment: Within Functional Limits Thoracic Assessment Thoracic Assessment: Within Functional Limits Lumbar Assessment Lumbar Assessment: Within Functional Limits Postural Control Postural Control: Within Functional Limits  Balance Balance Balance Assessed: Yes Static Sitting Balance Static Sitting - Level of Assistance: 5: Stand by assistance Dynamic Sitting Balance Dynamic Sitting - Level of Assistance: 5: Stand by assistance Static Standing Balance Static Standing - Level of Assistance: 4: Min assist Dynamic Standing Balance Dynamic Standing - Level of Assistance: 3: Mod assist Extremity/Trunk Assessment RUE Assessment RUE Assessment: Within Functional Limits LUE Assessment LUE Assessment: Within Functional Limits  Care Tool Care Tool Self Care Eating   Eating Assist Level: Set up assist    Oral Care    Oral Care Assist Level: Set up assist    Bathing   Body parts bathed by patient: Right arm;Left arm;Chest;Front perineal area;Face Body parts bathed by helper: Buttocks;Right upper leg;Left upper leg;Left lower leg;Right lower leg   Assist Level: Maximal Assistance - Patient 24 - 49%    Upper Body Dressing(including orthotics)   What is the patient wearing?: Pull over shirt   Assist Level: Minimal Assistance - Patient > 75%    Lower Body  Dressing (excluding footwear)   What is the patient wearing?: Pants;Incontinence brief Assist for lower body dressing: Maximal Assistance - Patient 25 - 49%    Putting on/Taking off footwear   What is the patient wearing?: Non-skid slipper socks Assist for footwear: Maximal Assistance - Patient 25 - 49%       Care Tool Toileting Toileting activity   Assist for toileting: Moderate Assistance -  Patient 50 - 74%     Care Tool Bed Mobility Roll left and right activity   Roll left and right assist level: Supervision/Verbal cueing    Sit to lying activity   Sit to lying assist level: Contact Guard/Touching assist    Lying to sitting on side of bed activity   Lying to sitting on side of bed assist level: the ability to move from lying on the back to sitting on the side of the bed with no back support.: Contact Guard/Touching assist     Care Tool Transfers Sit to stand transfer   Sit to stand assist level: Minimal Assistance - Patient > 75%    Chair/bed transfer   Chair/bed transfer assist level: Minimal Assistance - Patient > 75%     Toilet transfer Toilet transfer activity did not occur: Safety/medical concerns       Care Tool Cognition  Expression of Ideas and Wants Expression of Ideas and Wants: 4. Without difficulty (complex and basic) - expresses complex messages without difficulty and with speech that is clear and easy to understand  Understanding Verbal and Non-Verbal Content Understanding Verbal and Non-Verbal Content: 4. Understands (complex and basic) - clear comprehension without cues or repetitions   Memory/Recall Ability Memory/Recall Ability : Current season;That he or she is in a hospital/hospital unit   Refer to Care Plan for Long Term Goals  SHORT TERM GOAL WEEK 1 OT Short Term Goal 1 (Week 1): Pt will stand up to 3 min sinkside for grooming with S OT Short Term Goal 2 (Week 1): Pt will amb with LRAD to perform toileting with CGA overall OT Short Term Goal 3 (Week 1): Pt will complete LB self care with close S with AE  Recommendations for other services: Neuropsych and Therapeutic Recreation  Outing/community reintegration   Skilled Therapeutic Intervention ADL ADL Eating: Set up Where Assessed-Eating: Chair Grooming: Setup Where Assessed-Grooming: Chair Upper Body Bathing: Minimal assistance Where Assessed-Upper Body Bathing: Sitting at  sink Lower Body Bathing: Maximal assistance Where Assessed-Lower Body Bathing: Standing at sink;Sitting at sink Upper Body Dressing: Setup Where Assessed-Upper Body Dressing: Chair Lower Body Dressing: Maximal assistance Where Assessed-Lower Body Dressing: Chair Toileting: Moderate assistance Where Assessed-Toileting: Wheelchair;Bed level Toilet Transfer: Minimal assistance Toilet Transfer Method: Stand pivot Acupuncturist: Engineer, technical sales: Not assessed Film/video editor: Not assessed ADL Comments: min a UB, mod-max a LB, transfers min a Mobility  Bed Mobility Bed Mobility: Rolling Right;Rolling Left;Left Sidelying to Sit;Supine to Sit Rolling Right: Contact Guard/Touching assist Rolling Left: Contact Guard/Touching assist Left Sidelying to Sit: Contact Guard/Touching assist Supine to Sit: Minimal Assistance - Patient > 75% Transfers Sit to Stand: Minimal Assistance - Patient > 75%  OT Intervention/Treatment:  Pt seen for full initial OT evaluation and training session this am. Pt in bed upon OT arrival. OT introduced role of therapy and purpose of session. Pt open to all presented assessment and training this visit. OT assisted and assessed ADL's, mobility, vision, sensation. cognition/lang, G/FMC, strength and balance throughout session. See above for levels.Pt will benefit from skilled  OT services at CIR to maximize function and safety with recommendation to return home with mod I for BADL's and S for higher level activity and may not require f/u OT upon d/c home. Pt left at end of session in recliner with LE's elevated and chair alarm set, tray table and nurse call bell within reach.     Discharge Criteria: Patient will be discharged from OT if patient refuses treatment 3 consecutive times without medical reason, if treatment goals not met, if there is a change in medical status, if patient makes no progress towards goals or if patient is discharged  from hospital.  The above assessment, treatment plan, treatment alternatives and goals were discussed and mutually agreed upon: by patient  Vicenta Dunning 01/25/2023, 12:58 PM

## 2023-01-25 NOTE — Progress Notes (Addendum)
BLE dopplers reveal Right peroneal and R-popliteal DVT extending into popliteal fossa but not distal FV as well as Left peroneal, soleal and gastrocnemius DVTs. Contacted Dr. Lenell Antu for input on IVC as patient not candidate for any anticoagulation due to multiple bleeding episodes. He will add patient on for tomorrow. Will update patient and place patient on bedrest--OK for squat pivot to [facility] and limit to beside therapy till IVC placed.  Marland Kitchen

## 2023-01-25 NOTE — Progress Notes (Signed)
Inpatient Rehabilitation Center Individual Statement of Services  Patient Name:  [name] [name]  Date:  01/25/2023  Welcome to the Inpatient Rehabilitation Center.  Our goal is to provide you with an individualized program based on your diagnosis and situation, designed to meet your specific needs.  With this comprehensive rehabilitation program, you will be expected to participate in at least 3 hours of rehabilitation therapies Monday-Friday, with modified therapy programming on the weekends.  Your rehabilitation program will include the following services:  Physical Therapy (PT), Occupational Therapy (OT), 24 hour per day rehabilitation nursing, Therapeutic Recreaction (TR), Care Coordinator, Rehabilitation Medicine, Nutrition Services, and Pharmacy Services  Weekly team conferences will be held on wednesday to discuss your progress.  Your Inpatient Rehabilitation Care Coordinator will talk with you frequently to get your input and to update you on team discussions.  Team conferences with you and your family in attendance may also be held.  Expected length of stay: 10-14 days  Overall anticipated outcome: mod/I-supervision level goals  Depending on your progress and recovery, your program may change. Your Inpatient Rehabilitation Care Coordinator will coordinate services and will keep you informed of any changes. Your Inpatient Rehabilitation Care Coordinator's name and contact numbers are listed  below.  The following services may also be recommended but are not provided by the Inpatient Rehabilitation Center:  Driving Evaluations Home Health Rehabiltiation Services Outpatient Rehabilitation Services    Arrangements will be made to provide these services after discharge if needed.  Arrangements include referral to agencies that provide these services.  Your insurance has been verified to be:  medicare & [employer] Supplement Your primary doctor is:  Gershon Crane  Pertinent information will be  shared with your doctor and your insurance company.  Inpatient Rehabilitation Care Coordinator:  Dossie Der, [place] [phone] or Luna Glasgow  Information discussed with and copy given to patient by: Lucy Chris, 01/25/2023, 10:58 AM

## 2023-01-25 NOTE — Evaluation (Signed)
Physical Therapy Assessment and Plan  Patient Details  Name: RANULFO GEDDIE MRN: [id number] Date of Birth: [birth year]  PT Diagnosis: Abnormal posture, Abnormality of gait, Difficulty walking, Dizziness and giddiness, Edema, and Muscle weakness Rehab Potential: Fair ELOS: 10-14 days   Today's Date: 01/25/2023 PT Individual Time: 0900-1000, 1435-1535 PT Individual Time Calculation (min): 60 min, 57 min   Hospital Problem: Principal Problem:   Debility   Past Medical History:  Past Medical History:  Diagnosis Date   Ankle fracture    Cancer (HCC) 1974   testicular cancer-at age 62   Cataract    bilateral sx   GERD (gastroesophageal reflux disease)    hx of   Hx of colonic polyps    pt unsure,thinks this was done in 1982 in [place]. no reports in EPIC   Hyperlipidemia    on meds   Hypertension    on meds   Kidney stones    Patella fracture    Retinal detachment    OD   Retinal tear of right eye    Syncopal episodes 2008   Past Surgical History:  Past Surgical History:  Procedure Laterality Date   AIR/FLUID EXCHANGE Right 06/16/2014   Procedure: AIR/FLUID EXCHANGE;  Surgeon: Sherrie George, MD;  Location: [facility] OR;  Service: Ophthalmology;  Laterality: Right;   ANGIOPLASTY Left 12/21/2022   Procedure: BALLOON ANGIOPLASTY SUPERIOR MESENTERIC ARTERY;  Surgeon: Victorino Sparrow, MD;  Location: [facility] OR;  Service: Vascular;  Laterality: Left;   APPLICATION OF WOUND VAC N/A 12/21/2022   Procedure: APPLICATION OF ABTHERA WOUND VAC;  Surgeon: Violeta Gelinas, MD;  Location: [facility] OR;  Service: General;  Laterality: N/A;   BIOPSY  04/04/2021   Procedure: BIOPSY;  Surgeon: Lemar Lofty., MD;  Location: [facility] ENDOSCOPY;  Service: Gastroenterology;;   BIOPSY  01/10/2023   Procedure: BIOPSY;  Surgeon: Beverley Fiedler, MD;  Location: [facility] ENDOSCOPY;  Service: Gastroenterology;;   BOWEL RESECTION  12/21/2022   Procedure: SMALL BOWEL RESECTION;  Surgeon: Violeta Gelinas, MD;  Location: [facility] OR;   Service: General;;   BOWEL RESECTION N/A 12/25/2022   Procedure: SMALL BOWEL RESECTION W/ANASTOMOSIS X2;  Surgeon: Gaynelle Adu, MD;  Location: [facility] OR;  Service: General;  Laterality: N/A;   CATARACT EXTRACTION     CATARACT EXTRACTION  09/2014   rt eye//left 01/2015   CATARACT EXTRACTION, BILATERAL  2016   per Dr. Elmer Picker    COLONOSCOPY  02/16/2020   per Dr. Rhea Belton, adenomatous polyps, repeat in 3 yrs   COLONOSCOPY N/A 01/10/2023   Procedure: COLONOSCOPY;  Surgeon: Beverley Fiedler, MD;  Location: [facility] ENDOSCOPY;  Service: Gastroenterology;  Laterality: N/A;   ENTEROSCOPY N/A 04/04/2021   Procedure: ENTEROSCOPY;  Surgeon: Meridee Score Netty Starring., MD;  Location: [facility] ENDOSCOPY;  Service: Gastroenterology;  Laterality: N/A;   ESOPHAGOGASTRODUODENOSCOPY (EGD) WITH PROPOFOL N/A 01/10/2023   Procedure: ESOPHAGOGASTRODUODENOSCOPY (EGD) WITH PROPOFOL;  Surgeon: Beverley Fiedler, MD;  Location: [facility] ENDOSCOPY;  Service: Gastroenterology;  Laterality: N/A;   EYE SURGERY     GAS INSERTION Right 06/16/2014   Procedure: INSERTION OF GAS;  Surgeon: Sherrie George, MD;  Location: [facility] OR;  Service: Ophthalmology;  Laterality: Right;  [id number]   GIVENS CAPSULE STUDY N/A 04/02/2021   Procedure: GIVENS CAPSULE STUDY;  Surgeon: Lemar Lofty., MD;  Location: [facility] ENDOSCOPY;  Service: Gastroenterology;  Laterality: N/A;   HYDROCELE EXCISION     INSERTION OF ILIAC STENT N/A 12/21/2022   Procedure: INSERTION OF SUPERIOR MESENTERIC ARTERY STENT;  Surgeon: Victorino Sparrow, MD;  Location: [facility] OR;  Service: Vascular;  Laterality: N/A;   IR ANGIOGRAM VISCERAL SELECTIVE  04/04/2021   IR FLUORO GUIDE CV LINE RIGHT  01/12/2023   IR FLUORO GUIDE CV LINE RIGHT  01/16/2023   IR US GUIDE VASC ACCESS LEFT  04/04/2021   IR US GUIDE VASC ACCESS RIGHT  01/12/2023   KNEE ARTHROSCOPY Right    x 2   LAPAROTOMY N/A 12/21/2022   Procedure: EXPLORATION LAPAROTOMY;  Surgeon: Violeta Gelinas, MD;  Location: [facility] OR;  Service: General;  Laterality:  N/A;   LAPAROTOMY N/A 12/23/2022   Procedure: RE-EXPLORATION LAPAROTOMY ABTHERA VAC EXCHANGE;  Surgeon: Almond Lint, MD;  Location: [facility] OR;  Service: General;  Laterality: N/A;   LAPAROTOMY N/A 12/25/2022   Procedure: RE-EXPLORATION LAPAROTOMY W/CLOSURE;  Surgeon: Gaynelle Adu, MD;  Location: [facility] OR;  Service: General;  Laterality: N/A;   LASER PHOTO ABLATION Right 06/16/2014   Procedure: LASER PHOTO ABLATION;  Surgeon: Sherrie George, MD;  Location: [facility] OR;  Service: Ophthalmology;  Laterality: Right;  Headscope laser and endolaser    ORCHIECTOMY     right testicle   REPAIR OF COMPLEX TRACTION RETINAL DETACHMENT Right 06/16/2014   Procedure: REPAIR OF COMPLEX TRACTION RETINAL DETACHMENT;  Surgeon: Sherrie George, MD;  Location: [facility] OR;  Service: Ophthalmology;  Laterality: Right;   RETINAL DETACHMENT SURGERY  06/2014   SUBMUCOSAL TATTOO INJECTION  04/04/2021   Procedure: SUBMUCOSAL TATTOO INJECTION;  Surgeon: Lemar Lofty., MD;  Location: [facility] ENDOSCOPY;  Service: Gastroenterology;;   TONSILLECTOMY     ULTRASOUND GUIDANCE FOR VASCULAR ACCESS Left 12/21/2022   Procedure: ULTRASOUND GUIDANCE FOR VASCULAR ACCESS FEMORAL ARTER;  Surgeon: Victorino Sparrow, MD;  Location: [facility] OR;  Service: Vascular;  Laterality: Left;   VISCERAL ANGIOGRAM Left 12/21/2022   Procedure: SUPERIOR MESENTERIC ARTERY ANGIOGRAM;  Surgeon: Victorino Sparrow, MD;  Location: [facility] OR;  Service: Vascular;  Laterality: Left;   VITRECTOMY Right 06/16/2014   WISDOM TOOTH EXTRACTION      Assessment & Plan Clinical Impression: Patient is a 67 y.o. year old male with history of CKD, HTN, retinal tear R-eye, testicular CA, small bowel GIST s/p resection 2022, multiple episodes of upper and lower GIB, known mesenteric stenosis who started developing abdominal pain with inability to eat or drink or 3-4 weeks, 30 lbs wt loss and buttock pain with claudication and  was admitted from [facility] office on 12/20/22 due to concerns of  embolic  event leading to progression of SMA  and right sided iliac stenosis v/s  occlusion. He was started on IV heparin and CTA showed SMA thrombus w/plaque with recommendations of vascular intervention for bowel ischemia. He was taken to OR on 12/21/22 for exploratory lap with resection of necrotic area of jejunum and ABTHERA for open abdomen by Dr. Janee Morn and insertion of SMA stent by Dr. Lenell Antu. Post op with VDRF and required return to OR on 09/14 for reexploration with removal of 70 cm potentially compromised bowel followed by re-exploration on 09/16. He developed hypotension requiring pressors, had oliguric AKI due to ATN and required CRRT 09/22-09/24. He was started on TNA for nutritional support and had difficulty with extubation due to  poor mental status off sedation.    He developed significant hypotension on 09/20, he was found to have blood per NGT and rectum as well as abdominal distension with leucocytosis concerning for bowel ischemia. Repeat CTA showed patent SMA without signs of ischemia, he was transfused with  on unit PRBC and antibiotics coverage broadened. He was extubated on 09/22 and required prn haldol for delirium. Mentation slowly improved. NGT removed on 09/27 but kept NPO due to signs of dysphagia. Anticoagulation and antiplatelet resumed  but he started having intermittent bloody BMs with clots on 09/28 with steady drop in Hgb to 6.0  on 09/30 despite discontinuation of Argatroban but ASA kept on board due to stent. He continued to have significant bleeding with hypotension and A flutter later that evening and was started on amiodarone for rate control as well as multiple units PRBC as well as DDAVP. ASA d/c per Dr. Rhea Belton.   On 10/02, he underwent endoscopy revealing 2 cm HH, erythematous mucosa in gastric body without bleeding, no ulcers and  duodenal angulation. Flex sig revealed severely ulcerated mucosa with stigmata of bleeding in rectosigmoid colon and 80% of rectum as well as  scattered apthous ulcers in distal sigmoid colon--biopsy done which showed inflammatory changes c/w ischemia treated with max supportive care with addition units of blood. Bleeding had resolved by 10/03 but he continued to have worsening of renal status with rise in BUN/SCr to 160/4.05 with volume overload therefore internal jugular placed by Dr Deanne Coffer and underwent HD required HD 10/06-   He did continue to require intermittent transfusions to keep Hb> 7.0 and to hold off anticoagulation/antiplatelet given risk of bleeding outweighs benefits. Did have episode of maroon stool on 10/08 with stable Hgb. He was started on diet and TNA weaned off. Intake remains poor with multiple supplements ongoing and flexiseal ordered for diarrhea management.  DTI bilateral buttocks being followed by WOC with medihoney for debridement of nonviable tissue. Wet to dry dressing to midline incision ongoing. Renal status improving with increase in UOP and HD held since 10/14. Torsemide added to help manage edema. Foley removed but has required I/O caths with volumes 400-600 cc due to retention. Flomax added and titrated up to 0.8 mg yesterday.  He continues to have buttock pain, ongoing issues with frequent loose stools and fatigue with standing attempts. Dizziness has resolved but he's significantly deconditioned. CIR recommended due to functional decline. Currently ambulating 20 feet CG.   Patient currently requires min with mobility secondary to muscle weakness, decreased cardiorespiratoy endurance, and decreased sitting balance, decreased standing balance, and decreased balance strategies.  Prior to hospitalization, patient was independent  with mobility and lived with Spouse in a House home.  Home access is 1Level entry (pt reports no step through garage).  Patient will benefit from skilled PT intervention to maximize safe functional mobility, minimize fall risk, and decrease caregiver burden for planned discharge home.  Anticipate patient will benefit from follow up OP at discharge.  PT - End of Session Activity Tolerance: Tolerates 30+ min activity with multiple rests Endurance Deficit: Yes PT Assessment Rehab Potential (ACUTE/IP ONLY): Fair PT Barriers to Discharge: Wound Care;Weight PT Barriers to Discharge Comments: orthostatic hyptotension, dizziness, poor endurance PT Patient demonstrates impairments in the following area(s): Balance;Edema;Endurance;Motor;Skin Integrity PT Transfers Functional Problem(s): Bed Mobility;Bed to Chair;Car PT Locomotion Functional Problem(s): Ambulation;Wheelchair Mobility PT Plan PT Intensity: Minimum of 1-2 x/day ,45 to 90 minutes PT Frequency: 5 out of 7 days;Total of 15 hours over 7 days of combined therapies PT Duration Estimated Length of Stay: 10-14 days PT Treatment/Interventions: Ambulation/gait training;Discharge planning;Functional mobility training;Psychosocial support;Therapeutic Activities;Visual/perceptual remediation/compensation;Balance/vestibular training;Disease management/prevention;Neuromuscular re-education;Skin care/wound management;Therapeutic Exercise;Wheelchair propulsion/positioning;Cognitive remediation/compensation;DME/adaptive equipment instruction;Pain management;Splinting/orthotics;UE/LE Strength taining/ROM;Community reintegration;Functional electrical stimulation;Patient/family education;Stair training;UE/LE Coordination activities PT Transfers Anticipated Outcome(s): mod I PT Locomotion  Anticipated Outcome(s): mod I PT Recommendation Patient destination: Home Equipment Details: pt has walker, shower chiar, handicapped bars, handheld shower head   PT Evaluation Precautions/Restrictions Precautions Precautions: Fall;Other (comment) Precaution Comments: abdominal incision Restrictions Weight Bearing Restrictions: No Pain Interference Pain Interference Pain Effect on Sleep: 1. Rarely or not at all Pain Interference with Therapy  Activities: 1. Rarely or not at all Pain Interference with Day-to-Day Activities: 1. Rarely or not at all Home Living/Prior Functioning Home Living Living Arrangements: Spouse/significant other Available Help at Discharge: Family;Available 24 hours/day Type of Home: House Home Access: Level entry (pt reports no step through garage) Entrance Stairs-Number of Steps: 1 Home Layout: Two level;Able to live on main level with bedroom/bathroom Bathroom Shower/Tub: Health visitor: Handicapped height Bathroom Accessibility: Yes  Lives With: Spouse Prior Function Level of Independence: Independent with basic ADLs;Independent with gait;Independent with transfers;Independent with homemaking with ambulation  Able to Take Stairs?: Yes Driving: Yes Vocation: Retired Vision/Perception  Vision - History Ability to See in Adequate Light: 0 Adequate Perception Perception: Within Functional Limits Praxis Praxis: WFL  Cognition Overall Cognitive Status: Within Functional Limits for tasks assessed Arousal/Alertness: Awake/alert Orientation Level: Oriented X4 Year: 2024 Month: October Day of Week: Correct Memory: Appears intact Awareness: Appears intact Problem Solving: Appears intact Safety/Judgment: Appears intact Sensation Sensation Light Touch: Appears Intact Proprioception: Appears Intact Coordination Gross Motor Movements are Fluid and Coordinated: No Fine Motor Movements are Fluid and Coordinated: No Coordination and Movement Description: limited by fatigue/debility/weakness Motor  Motor Motor: Other (comment) Motor - Skilled Clinical Observations: debility   Trunk/Postural Assessment  Cervical Assessment Cervical Assessment: Within Functional Limits Thoracic Assessment Thoracic Assessment: Within Functional Limits Lumbar Assessment Lumbar Assessment: Within Functional Limits Postural Control Postural Control: Within Functional Limits   Balance Balance Balance Assessed: Yes Static Sitting Balance Static Sitting - Level of Assistance: 5: Stand by assistance Dynamic Sitting Balance Dynamic Sitting - Level of Assistance: 5: Stand by assistance Static Standing Balance Static Standing - Level of Assistance: 4: Min assist Dynamic Standing Balance Dynamic Standing - Level of Assistance: 3: Mod assist Extremity Assessment  RLE Assessment RLE Assessment: Exceptions to [facility] General Strength Comments: not formally assessed, grossly 3+/5 LLE Assessment LLE Assessment: Exceptions to [facility] General Strength Comments: not formally assessed, grossly 3+/5  Care Tool Care Tool Bed Mobility Roll left and right activity   Roll left and right assist level: Supervision/Verbal cueing    Sit to lying activity   Sit to lying assist level: Contact Guard/Touching assist    Lying to sitting on side of bed activity   Lying to sitting on side of bed assist level: the ability to move from lying on the back to sitting on the side of the bed with no back support.: Contact Guard/Touching assist     Care Tool Transfers Sit to stand transfer   Sit to stand assist level: Minimal Assistance - Patient > 75%    Chair/bed transfer   Chair/bed transfer assist level: Minimal Assistance - Patient > 75%    Car transfer    Safety/Medical       Care Tool Locomotion Ambulation    Safety/medical       Walk 10 feet activity    Safety/Medical      Walk 50 feet with 2 turns activity    Safety/Medical     Walk 150 feet activity    Safety/Medical    Walk 10 feet on uneven surfaces activity    Safety/Medical  Stairs      Safety/Medical     Walk up/down 1 step activity    Safety/Medical     Walk up/down 4 steps activity    Safety/Medical     Walk up/down 12 steps activity    Safety/Medical    Pick up small objects from floor   Pick up small object from the floor assist level: Total Assistance - Patient < 25%    Wheelchair       Dependent       Wheel 50 feet with 2 turns activity    Dependent   Wheel 150 feet activity    Dependent     Refer to Care Plan for Long Term Goals  SHORT TERM GOAL WEEK 1 PT Short Term Goal 1 (Week 1): Pt will tolerate sitting OOB for 3 hours each day PT Short Term Goal 2 (Week 1): Pt will ambulate 50 feet or more with LRAD and min A PT Short Term Goal 3 (Week 1): Pt will perform stand pivot transfer with LRAD and CGA  Recommendations for other services: None   Skilled Therapeutic Intervention Mobility Bed Mobility Bed Mobility: Rolling Right;Rolling Left;Left Sidelying to Sit;Supine to Sit Rolling Right: Supervision/verbal cueing Rolling Left: Supervision/Verbal cueing Left Sidelying to Sit: Contact Guard/Touching assist Supine to Sit: Contact Guard/Touching assist Transfers Transfers: Sit to Stand;Stand to Sit Sit to Stand: Minimal Assistance - Patient > 75% Stand to Sit: Minimal Assistance - Patient > 75% Transfer (Assistive device): None Locomotion  Gait Ambulation: No Gait Gait: No Stairs / Additional Locomotion Stairs: No Wheelchair Mobility Wheelchair Mobility: No   Discharge Criteria: Patient will be discharged from PT if patient refuses treatment 3 consecutive times without medical reason, if treatment goals not met, if there is a change in medical status, if patient makes no progress towards goals or if patient is discharged from hospital.  The above assessment, treatment plan, treatment alternatives and goals were discussed and mutually agreed upon: by patient  Today's Interventions  Treatment Session 1   Pt supine in bed upon arrival. Pt agreeable to therapy. Pt denies any pain.   Evaluation completed (see details above and below) with education on PT POC and goals and individual treatment initiated with focus on upright tolerance. Education on PT evaluation, CIR policies, and therapy schedule.   Therapist brought a 20x18 WC to room with roho  cushion 2/2 DTI on buttocks.   Session overall limited by pt orthostatic hypotension, pt has hypertension in sitting with orthostatic hypotension with standing. Pt reports dizziness with supine to sit, and sit to stand. Donned thigh high ted hose and end of session-as pt iniitally hypertensive in supine and sitting.    Seated EOB BP 152/97-pt reports dizziness   Return to supine 167/76 HR 82   Seated EOB BP 168/95 HR 88  Standing BP 122/81 HR 108-pt reports dizziness, returned to supine and donned thigh high ted hose for OT coming in. Nurse notified.   Pt perfomed rolling bilaterally with no AD and supervision, and supine to sit, sit to supine with CGA and increased time.   Pt performed sit to stand with no AD and min A. Pt required min A for stability for static standing balance.   Therapist offered to assist pt to transfer for improved upright tolerance. Pt requesting to stay in bed until OT session 2/2 fatigue.   Pt supine in bed at end of session with all needs within reach and bed alarm on.   Treatment Session  2   Pt asleep in bed upon arrival. Pt agreeable to therapy. Pt denies any pain. Pt wearing ted hose. Pt reports he sat up in recliner after OT session, and stayed in recliner until after lunch, then returned to bed. Pt reports falling sleep upon IV placement. Pt reports dizziness and weakness with supine to sit and sit to stand. Pt demos orthostatic hypotension with sit to stand: see values below.   Pt performed supine to sit with supervision. Pt reports dizziness, however dissipates with prolonged sitting. Pt performed sit to stand with min A, and static standing balance with CGA/min A, pt reports dizziness with standing, able to tolerate standing long enough for BP assessment but needing to sit afterwards.   Pt performed seated therex for B LE strengthening and upright tolerance, pt then reporting need to lay down 2/2 dizziness:   1x10 LAQ B  1x10 seated alternating marching  B  1x10 seated heel/toe raises  Pt able to tolerate supine positioning with HOB elevated, pt performed:   1x10 supine hip abduction B  1x10 heel slides B  1x10 knee fall outs   Nurse and MD notified of pt vitals and symptoms.  seated EOB BP 162/96 HR 90-pt reports dizziness standing after prolonged rest BP 155/82 HR 105 returned to sitting 164/97 standing 138/82 HR 105-pt reports dizziness  Education provided on importance of wearing ted hose during day, drinking fluid/staying hydrated, and sitting upright OOB throughout day. Pt verbalized understanding and agreeable. Pt requesting to stay in bed for rest of day today and reports will trial recliner or wheelchair again tomorrow.   Pt supine in bed at end of session with all needs in reach and bed alarm on.    [facility] [place], [place], DPT  01/25/2023, 12:35 PM

## 2023-01-25 NOTE — Progress Notes (Addendum)
PROGRESS NOTE   Subjective/Complaints: Patient reports he had a good night overall.  He is ready to start working with therapy this morning.  No new complaints or concerns  ROS: Patient denies fever, dizziness, nausea, vomiting, cough, shortness of breath or chest pain, headache, or mood change. + Generalized fatigue + Diarrhea-improving  Objective:   No results found. Recent Labs    01/23/23 0615 01/25/23 0459  WBC 5.7 6.1  HGB 9.2* 9.5*  HCT 28.3* 28.6*  PLT 155 163   Recent Labs    01/24/23 0520 01/25/23 0459  NA 138 139  K 3.5 3.3*  CL 104 103  CO2 23 24  GLUCOSE 94 111*  BUN 32* 32*  CREATININE 2.17* 2.37*  CALCIUM 8.1* 8.4*    Intake/Output Summary (Last 24 hours) at 01/25/2023 1502 Last data filed at 01/25/2023 1342 Gross per 24 hour  Intake 713 ml  Output 1700 ml  Net -987 ml     Pressure Injury 01/04/23 Buttocks Right;Upper Unstageable - Full thickness tissue loss in which the base of the injury is covered by slough (yellow, tan, gray, green or brown) and/or eschar (tan, brown or black) in the wound bed. (Active)  01/04/23 2100  Location: Buttocks  Location Orientation: Right;Upper  Staging: Unstageable - Full thickness tissue loss in which the base of the injury is covered by slough (yellow, tan, gray, green or brown) and/or eschar (tan, brown or black) in the wound bed.  Wound Description (Comments):   Present on Admission: No    Physical Exam: Vital Signs Blood pressure (!) 178/85, pulse 78, temperature (!) 97.3 F (36.3 C), resp. rate 18, weight 103.8 kg, SpO2 97%.  Gen: no distress, normal appearing HEENT: oral mucosa pink and moist, NCAT Cardio: RRR, no increased work of breathing Chest: CTAB, normal effort, normal rate of breathing Abd: soft, non-distended, positive bowel sounds Ext: 1+ lower extremity edema Psych: pleasant, normal affect Skin: +R internal jugular catheter.  Unstageable w/ slough and eschar to right upper buttock with foam dressing in place, abdominal incision c/d/i Neuro:  Moving all 4 extremities to gravity and resistance, judgment and insight appears to be overall normal  Assessment/Plan: 1. Functional deficits which require 3+ hours per day of interdisciplinary therapy in a comprehensive inpatient rehab setting. Physiatrist is providing close team supervision and 24 hour management of active medical problems listed below. Physiatrist and rehab team continue to assess barriers to discharge/monitor patient progress toward functional and medical goals  Care Tool:  Bathing    Body parts bathed by patient: Right arm, Left arm, Chest, Front perineal area, Face   Body parts bathed by helper: Buttocks, Right upper leg, Left upper leg, Left lower leg, Right lower leg     Bathing assist Assist Level: Maximal Assistance - Patient 24 - 49%     Upper Body Dressing/Undressing Upper body dressing   What is the patient wearing?: Pull over shirt    Upper body assist Assist Level: Minimal Assistance - Patient > 75%    Lower Body Dressing/Undressing Lower body dressing      What is the patient wearing?: Pants, Incontinence brief     Lower body assist Assist  for lower body dressing: Maximal Assistance - Patient 25 - 49%     Toileting Toileting    Toileting assist Assist for toileting: Moderate Assistance - Patient 50 - 74%     Transfers Chair/bed transfer  Transfers assist     Chair/bed transfer assist level: Minimal Assistance - Patient > 75%     Locomotion Ambulation   Ambulation assist              Walk 10 feet activity   Assist           Walk 50 feet activity   Assist           Walk 150 feet activity   Assist           Walk 10 feet on uneven surface  activity   Assist           Wheelchair     Assist               Wheelchair 50 feet with 2 turns  activity    Assist            Wheelchair 150 feet activity     Assist          Blood pressure (!) 178/85, pulse 78, temperature (!) 97.3 F (36.3 C), resp. rate 18, weight 103.8 kg, SpO2 97%.  Medical Problem List and Plan: 1.Debility 2/2 necrotic jejunum s/p resection             -will discuss with surgery whether patient may shower             -ELOS/Goals: 10 days modI             -Continue CIR PT/OT   2.  Antithrombotics: -DVT/anticoagulation:  Mechanical: Sequential compression devices, below knee Bilateral lower extremities             -antiplatelet therapy: N/A due to GIB 3. Pain Management:  Oxycodone prn.  4. Mood/Behavior/Sleep: LCSW to follow for evaluation and support.              -antipsychotic agents: N/A             --Melatonin prn for insomnia.  5. Neuropsych/cognition: This patient is capable of making decisions on his own behalf. 6. Skin/Wound Care: Routine pressure relief measures. Wet to dry dressing changes BID             --Air mattress overalay for DTI on buttocks.  7. Fluids/Electrolytes/Nutrition: Strict I/O. Monitor renal status daily.  Continue nutritional supplements             --Will change ensure to [facility] instead. .  -10/17 replete potassium K-Dur to 30 mEq in the morning and 20 mEq in the evening.  It appears he got 40 mEq on 10/15 and this brought him up to 3.5 the next day.  Replete magnesium IV 2 g for hypomagnesemia recheck tomorrow  8. 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  9. H/o Small bowel GIST s/p resection: Complicated hx with prior GIBs, duodenal ulcers, intestinal  angioectasias  w/hemorrhage, diverticulosis w/hemorrhage, gastritis and duodenitis             --consult GI for recurrent bleed.               --  continue Protonix 40 mg IV BID 10. ABLA: Due to multiple episodes of rectal bleeding due to deep and severe ulceration in rectum/rectosigmoid  --not a candidate fro anticoagulation due to risk of bleeding.  --Required multiple transfusions 10/01 --to transfuse prn Hgb<7 11. AKI on CKD: Multifactorial due to surgery, contrast and hypotension. Baseline SCr-2.0 (followed by Dr. Glenna Fellows).              --strict I/O. Monitor PVRs and cath to keep volumes <350 cc             --required CRRT followed by HD-->last HD on 10/12 and internal jugular in place.              --Was started on torsemide 10/16 due to BLE edema  -Reviewed nephrology note 12. A fib w/RVR: Monitor HR with increase in activity. Continue amiodarone    -10/17 repeat potassium and magnesium 13. R-RAS: No plans for intervention as small and felt to have minimal function   14. HTN: Monitor BP TID--continue Norvasc, hydralazine and Demadex             --monitor electrolytes for recurrent hypokalemia. Recheck Mg level in am. Add magnesium gluconate 250mg  HS  -10/17 give magnesium sulfate 2 g IV for hypomagnesia, called pharmacy to check given renal function and should be okay.  Consider amlodipine to 7.5 mg daily  -addendum Therapy reporting orthostatic hypotension symptoms while wearing compression to b/l LE, will ask nephrology if going down on      01/25/2023    1:48 PM 01/25/2023    6:01 AM 01/24/2023    7:54 PM  Vitals with BMI  Systolic 178 186 161  Diastolic 85 90 86  Pulse 78 77 80      15. Unstageable with slough and eschar to right buttock: continue Medihoney with foam dressing for debridement and moderate drainage.    16. Diarrhea: Multifactorial -->continue banatrol, Will schedule lomotil qid and monitor  -10/17 change Banatrol to FiberCon  17.  Right eye irritation.  Nursing reported redness to right eye conjunctiva, no purulent drainage.  Start Lacri-Lube and monitor  LOS: 1 days A FACE TO FACE  EVALUATION WAS PERFORMED  Fanny Dance 01/25/2023, 3:02 PM

## 2023-01-25 NOTE — Progress Notes (Signed)
[facility] Progress Note   Assessment/ Plan:   Acute kidney injury on CKD IIIb: Baseline creatinine level around 2.0.  Acute kidney injury likely ischemic ATN due to ex lap surgery/IV contrast/hypotension  Started on HD 9/18 due to inability to wean from vent and worsening renal function.  He was then switched to CRRT on 9/20-24. Course complicated by GI bleed.  Started on HD on 10/5 after nontunneled catheter placement with IR on 10/4 Reconsulted IR for a tunneled catheter- placed 10/8, appreciate assistance Agree with not intervening on right RAS given that his right kidney is already much smaller than anticipated, discussed with VVS at that time. Likely not much function being contributed from right.   Strict ins and outs and daily weights Creatinine is now improved and not needing dialysis. Continue torsemide 40 mg daily for edema If edema responds well and creatinine is stable likely remove dialysis catheter next week Acute mesenteric ischemia: Status post small bowel resection and superior mesenteric artery stent placement.  Followed by vascular surgery.  S/p OR again 9/16 for exlap and further small bowel resection, abd closed Hypertension: May improve with volume optimization.  Started amlodipine 5 mg daily.  Also on hydralazine 50 mg 3 times daily.  Continue to monitor. Hypokalemia: On oral replacement daily.  Continue to monitor.  Replace magnesium as indicated Anemia due to acute blood loss: transfuse prn for hgb <7.  PRBC's per primary team. Multiple transfusions on 10/1.  S/p EGD and colonoscopy on 10/2.  Transfusions intermittently Acute GI bleed - s/p PRBC's and GI is following.  S/p EGD and colonoscopy as above on 10/2 with severe mucosal ulceration in the rectum and rectosigmoid colon (felt ischemic or infectious per GI) Severe protein malnutrition - nutrition per primary team; improving  Subjective:    Patient feels well today with no complaints.  Edema slightly  better.   Objective:   BP (!) 186/90 (BP Location: Left Arm)   Pulse 77   Temp 98.2 F (36.8 C)   Resp 16   Wt 103.8 kg   SpO2 99%   BMI 31.92 kg/m   Physical Exam: Gen:NAD, lying flat in bed CVS: normal rate Resp: Bilateral chest rise with no increased work of breathing Abd: soft, nondistended Ext: 1+ lower extremity edema ACCESS: tunneled internal jugular cathter R side   Labs: BMET Recent Labs  Lab 01/19/23 0357 01/20/23 0639 01/21/23 0500 01/22/23 0629 01/23/23 0615 01/24/23 0520 01/25/23 0459  NA 135 138 136 139 137 138 139  K 4.0 3.6 3.2* 3.5 3.1* 3.5 3.3*  CL 97* 101 99 100 101 104 103  CO2 25 23 27 25 25 23 24   GLUCOSE 106* 119* 118* 96 91 94 111*  BUN 92* 91* 42* 39* 35* 32* 32*  CREATININE 4.22* 3.80* 2.19* 2.55* 2.09* 2.17* 2.37*  CALCIUM 7.9* 8.0* 7.5* 7.7* 7.6* 8.1* 8.4*  PHOS 3.3 3.3 2.8  --   --   --   --    CBC Recent Labs  Lab 01/21/23 0500 01/22/23 0629 01/23/23 0615 01/25/23 0459  WBC 6.4 6.3 5.7 6.1  NEUTROABS  --   --   --  4.7  HGB 7.6* 7.4* 9.2* 9.5*  HCT 23.5* 23.2* 28.3* 28.6*  MCV 88.3 90.6 90.7 88.0  PLT 145* 140* 155 163      Medications:     amiodarone  200 mg Oral Daily   amLODipine  5 mg Oral Daily   ascorbic acid  500 mg Oral  Daily   Chlorhexidine Gluconate Cloth  6 each Topical Q0600   diphenoxylate-atropine  1 tablet Oral TID AC   fiber supplement (BANATROL TF)  60 mL Oral BID   Gerhardt's butt cream   Topical BID   hydrALAZINE  50 mg Oral TID   leptospermum manuka honey  1 Application Topical Daily   multivitamin  1 tablet Oral QHS   pantoprazole  40 mg Oral BID   potassium chloride  30 mEq Oral BID   protein supplement  1 Scoop Oral TID WC   tamsulosin  0.8 mg Oral QPC supper   torsemide  40 mg Oral Daily

## 2023-01-26 ENCOUNTER — Encounter (HOSPITAL_COMMUNITY)
Admission: AD | Disposition: A | Discharge status: Other Acute Inpatient Hospital | Payer: Self-pay | Source: Intra-hospital | Attending: Physical Medicine & Rehabilitation

## 2023-01-26 DIAGNOSIS — I824Y3 Acute embolism and thrombosis of unspecified deep veins of proximal lower extremity, bilateral: Secondary | ICD-10-CM

## 2023-01-26 DIAGNOSIS — I10 Essential (primary) hypertension: Secondary | ICD-10-CM

## 2023-01-26 DIAGNOSIS — I82499 Acute embolism and thrombosis of other specified deep vein of unspecified lower extremity: Secondary | ICD-10-CM

## 2023-01-26 HISTORY — PX: IVC FILTER INSERTION: CATH118245

## 2023-01-26 SURGERY — IVC FILTER INSERTION
Anesthesia: LOCAL

## 2023-01-26 MED ORDER — IODIXANOL 320 MG/ML IV SOLN
INTRAVENOUS | Status: DC | PRN
  Administered 2023-01-26: 20 mL via INTRAVENOUS

## 2023-01-26 MED ORDER — FENTANYL CITRATE (PF) 100 MCG/2ML IJ SOLN
INTRAMUSCULAR | Status: DC | PRN
  Administered 2023-01-26: 50 ug via INTRAVENOUS

## 2023-01-26 MED ORDER — SODIUM CHLORIDE 0.9 % IV SOLN: INTRAVENOUS | Status: AC

## 2023-01-26 MED ORDER — MIDAZOLAM HCL 2 MG/2ML IJ SOLN
INTRAMUSCULAR | Status: AC
  Filled 2023-01-26: qty 2

## 2023-01-26 MED ORDER — LIDOCAINE HCL (PF) 1 % IJ SOLN
INTRAMUSCULAR | Status: DC | PRN
  Administered 2023-01-26: 15 mL

## 2023-01-26 MED ORDER — FENTANYL CITRATE (PF) 100 MCG/2ML IJ SOLN
INTRAMUSCULAR | Status: AC
  Filled 2023-01-26: qty 2

## 2023-01-26 MED ORDER — MIDAZOLAM HCL 2 MG/2ML IJ SOLN
INTRAMUSCULAR | Status: DC | PRN
  Administered 2023-01-26: 1 mg via INTRAVENOUS

## 2023-01-26 MED ORDER — HEPARIN (PORCINE) IN NACL 2000-0.9 UNIT/L-% IV SOLN
INTRAVENOUS | Status: DC | PRN
  Administered 2023-01-26: 1000 mL

## 2023-01-26 MED ORDER — LIDOCAINE HCL (PF) 1 % IJ SOLN
INTRAMUSCULAR | Status: AC
  Filled 2023-01-26: qty 30

## 2023-01-26 SURGICAL SUPPLY — 7 items
COVER DOME SNAP 22 D (MISCELLANEOUS) IMPLANT
FILTER VC CELECT-FEMORAL (Filter) IMPLANT
KIT MICROPUNCTURE NIT STIFF (SHEATH) IMPLANT
SET ATX-X65L (MISCELLANEOUS) IMPLANT
SHEATH PROBE COVER 6X72 (BAG) IMPLANT
TRAY PV CATH (CUSTOM PROCEDURE TRAY) ×1 IMPLANT
WIRE BENTSON .035X145CM (WIRE) IMPLANT

## 2023-01-26 NOTE — Progress Notes (Signed)
Occupational Therapy Session Note  Patient Details  Name: MCCALL BEAUCHESNE MRN: [id number] Date of Birth: 09/29/1955  Today's Date: 01/26/2023 OT Individual Time: 1st Session 0845-1000, 2nd Session 1045-1130  OT Individual Time Calculation (min): 75 min, 2nd session 45 min with Missed 30 min due to bedrest MD order- will attempt to make up as able    Short Term Goals: Week 1:  OT Short Term Goal 1 (Week 1): Pt will stand up to 3 min sinkside for grooming with S OT Short Term Goal 2 (Week 1): Pt will amb with LRAD to perform toileting with CGA overall OT Short Term Goal 3 (Week 1): Pt will complete LB self care with close S with AE  Skilled Therapeutic Interventions/Progress Updates:   Session 1:  Pt bed level this day due to LE DVT's with MD order for bed rest therapy only. Plan for procedure later today with vascular to address as pt NPO. Wife bedside for session and pt open to bed level therapy. Pt educated in light UE HEP in writing for breathing strategies, proximal shoulder shrugs and blade squeezes, light tband for triceps press, B hand foam cube grasp, pinch and presses. OT introduced reacher use for retrieving objects out of reach and pt has several at home for use. BORG scale training with written information and discussion on scenarios for energy conservation and RPE qualification with + teach back and pt and wife demonstrating understanding. Grip strength tested with R hand 45 lbs and L hand 42 lbs which is significantly decreased for age and gender. Left pt bed level with all safety measures and needs in place.   Pain: denies any pain    Session 2:  Pt requesting shaving bed level. Wife bedside for session. OT encouraged HOB elevated for task with large mirror provided and set up for task. Increased time and effort needed with rest breaks and energy conservation education. Some mild B hand tremors noted with L> R observed. 9 HPT administered with 28 sec on R 27 sec L. Pt left  positioned in sidelying for skin protection with all safety needs and items in reach with wife present bedside.    Pain: denies any pain    Therapy Documentation Precautions:  Precautions Precautions: Fall, Other (comment) Precaution Comments: abdominal incision Restrictions Weight Bearing Restrictions: No   Therapy/Group: Individual Therapy  Vicenta Dunning 01/26/2023, 7:29 AM

## 2023-01-26 NOTE — Progress Notes (Signed)
[name] Progress Note   Assessment/ Plan:   AKI on CKD IIIb: Baseline creatinine level around 2.0.  Acute kidney injury likely ischemic ATN due to ex lap surgery/IV contrast/hypotension  Started on HD 9/18 due to inability to wean from vent and worsening renal function.  He was then switched to CRRT on 9/20-24. Course complicated by GI bleed.  Started on HD on 10/5 after nontunneled catheter placement with IR on 10/4 Reconsulted IR for a tunneled catheter- placed 10/8, appears that last HD was 10/12 Agree with not intervening on right RAS given that his right kidney is already much smaller than anticipated, discussed with VVS at that time. Likely not much function being contributed from right.    Creatinine is now improved and not needing dialysis. Continue torsemide 40 mg daily for edema If edema responds well and creatinine is stable likely remove dialysis catheter next week Acute mesenteric ischemia: Status post small bowel resection and superior mesenteric artery stent placement.  Followed by vascular surgery.  S/p OR again 9/16 for exlap and further small bowel resection, abd closed Hypertension: May improve with volume optimization.  Started amlodipine 5 mg daily.  Also on hydralazine 50 mg 3 times daily.  Continue to monitor. Hypokalemia: On oral replacement daily.  Continue to monitor.  Replace magnesium as indicated Anemia due to acute blood loss: transfuse prn for hgb <7.  PRBC's per primary team. Multiple transfusions on 10/1.  S/p EGD and colonoscopy on 10/2.  Transfusions intermittently Acute GI bleed - s/p PRBC's and GI is following.  S/p EGD and colonoscopy as above on 10/2 with severe mucosal ulceration in the rectum and rectosigmoid colon (felt ischemic or infectious per GI) Severe protein malnutrition - nutrition per primary team; improving  Subjective:    Patient feels well today with no complaints.  Edema slightly better.  S/p IVC filter-  making good urine    Objective:   BP (!) 163/85 (BP Location: Left Arm)   Pulse 82   Temp 97.8 F (36.6 C) (Oral)   Resp 18   Wt 104.8 kg   SpO2 100%   BMI 32.22 kg/m   Physical Exam: Gen:NAD, lying flat in bed CVS: normal rate Resp: Bilateral chest rise with no increased work of breathing Abd: soft, nondistended Ext: trace lower extremity edema ACCESS: tunneled internal jugular cathter R side   Labs: BMET Recent Labs  Lab 01/20/23 0639 01/21/23 0500 01/22/23 0629 01/23/23 0615 01/24/23 0520 01/25/23 0459  NA 138 136 139 137 138 139  K 3.6 3.2* 3.5 3.1* 3.5 3.3*  CL 101 99 100 101 104 103  CO2 23 27 25 25 23 24   GLUCOSE 119* 118* 96 91 94 111*  BUN 91* 42* 39* 35* 32* 32*  CREATININE 3.80* 2.19* 2.55* 2.09* 2.17* 2.37*  CALCIUM 8.0* 7.5* 7.7* 7.6* 8.1* 8.4*  PHOS 3.3 2.8  --   --   --   --    CBC Recent Labs  Lab 01/21/23 0500 01/22/23 0629 01/23/23 0615 01/25/23 0459  WBC 6.4 6.3 5.7 6.1  NEUTROABS  --   --   --  4.7  HGB 7.6* 7.4* 9.2* 9.5*  HCT 23.5* 23.2* 28.3* 28.6*  MCV 88.3 90.6 90.7 88.0  PLT 145* 140* 155 163      Medications:     amiodarone  200 mg Oral Daily   amLODipine  5 mg Oral Daily   ascorbic acid  500 mg Oral Daily   Chlorhexidine Gluconate Cloth  6 each Topical Q12H   diphenoxylate-atropine  1 tablet Oral TID AC   Gerhardt's butt cream   Topical BID   hydrALAZINE  50 mg Oral TID   leptospermum manuka honey  1 Application Topical Daily   multivitamin  1 tablet Oral QHS   pantoprazole  40 mg Oral BID   polycarbophil  625 mg Oral BID   protein supplement  1 Scoop Oral TID WC   tamsulosin  0.8 mg Oral QPC supper   torsemide  40 mg Oral Daily

## 2023-01-26 NOTE — Plan of Care (Signed)
[facility] Tonsillectomy/Adenoidectomy, Postoperative PEDS care plan entered in error.

## 2023-01-26 NOTE — Op Note (Signed)
DATE OF SERVICE: 01/26/2023  PATIENT:  [name] [name]  67 y.o. male  PRE-OPERATIVE DIAGNOSIS:  DVT, contraindication to anticoagulation  POST-OPERATIVE DIAGNOSIS:  Same  PROCEDURE:   Placement of inferior vena cava filter Conscious sedation (21 minutes)  SURGEON:  Surgeons and Role:    * Leonie Douglas, MD - Primary  ASSISTANT: none  ANESTHESIA:   local and IV sedation  EBL: minimal  BLOOD ADMINISTERED:none  DRAINS: none   LOCAL MEDICATIONS USED:  LIDOCAINE   SPECIMEN:  none  COUNTS: confirmed correct.  TOURNIQUET:  none  PATIENT DISPOSITION:  PACU - hemodynamically stable.   Delay start of Pharmacological VTE agent (>24hrs) due to surgical blood loss or risk of bleeding: no  INDICATION FOR PROCEDURE: [name] [name] is a 67 y.o. male with DVT and contraindication to anticoagulation. After careful discussion of risks, benefits, and alternatives the patient was offered IVC filter. The patient understood and wished to proceed.  OPERATIVE FINDINGS: successful deployment of IVCF  DESCRIPTION OF PROCEDURE: After identification of the patient in the pre-operative holding area, the patient was transferred to the operating room. The patient was positioned supine on the operating room table. Anesthesia was induced. The groins were prepped and draped in standard fashion. A surgical pause was performed confirming correct patient, procedure, and operative location.  Using ultrasound guidance, the right common femoral vein was accessed with micropuncture technique.  Using Seldinger technique, a micro sheath was introduced into the common femoral vein.  A Bentson wire was navigated into the inferior vena cava.  The tract was dilated.  A Cook select system was prepared per [employer] instructions and brought onto the field.  The sheath was delivered into the infrarenal IVC.  Central venogram was performed and the renal anatomy was marked.  An IVC filter was then deployed well below the  renal veins.  A follow-up venogram confirmed good position.  All endovascular equipment was removed.  Manual pressure was held on the groin.  Hemostasis was achieved.  A bandage was applied.  Upon completion of the case instrument and sharps counts were confirmed correct. The patient was transferred to the PACU in good condition. I was present for all portions of the procedure.  FOLLOW UP PLAN: Assuming a normal postoperative course, we will remove the IVC filter as soon as he can be anticoagulated safely.  [name]. [name] Antu, MD [facility] Phone Number: [phone] 01/26/2023 2:26 PM

## 2023-01-26 NOTE — Progress Notes (Signed)
PROGRESS NOTE   Subjective/Complaints: Pt noted to have b/l DVTs, on bedrest. Vascular to see patient today.  Plan for IVC filter. No additional concerns today.   ROS: Patient denies fever, chills, rash,  dizziness, nausea, vomiting, cough, shortness of breath or chest pain, headache, or mood change. + Generalized fatigue + Diarrhea-improving  Objective:   VAS [place] LOWER EXTREMITY VENOUS (DVT)  Result Date: 01/25/2023  Lower Venous DVT Study Patient Name:  [name] [name]  Date of Exam:   01/25/2023 Medical Rec #: [id number]      Accession #:    [id number] Date of Birth: February 07, 1956      Patient Gender: M Patient Age:   67 years Exam Location:  [facility] Procedure:      VAS [place] LOWER EXTREMITY VENOUS (DVT) Referring Phys: PAMELA LOVE --------------------------------------------------------------------------------  Indications: Immobility.  Risk Factors: None identified. Limitations: Poor ultrasound/tissue interface. Comparison Study: No prior studies. Performing Technologist: Chanda Busing RVT  Examination Guidelines: A complete evaluation includes B-mode imaging, spectral Doppler, color Doppler, and power Doppler as needed of all accessible portions of each vessel. Bilateral testing is considered an integral part of a complete examination. Limited examinations for reoccurring indications may be performed as noted. The reflux portion of the exam is performed with the patient in reverse Trendelenburg.  +---------+---------------+---------+-----------+----------+--------------+ RIGHT    CompressibilityPhasicitySpontaneityPropertiesThrombus Aging +---------+---------------+---------+-----------+----------+--------------+ CFV      Full           Yes      Yes                                 +---------+---------------+---------+-----------+----------+--------------+ SFJ      Full                                                         +---------+---------------+---------+-----------+----------+--------------+ FV Prox  Full                                                        +---------+---------------+---------+-----------+----------+--------------+ FV Mid   Full                                                        +---------+---------------+---------+-----------+----------+--------------+ FV DistalFull                                                        +---------+---------------+---------+-----------+----------+--------------+ PFV      Full  Yes      Yes                                 +---------+---------------+---------+-----------+----------+--------------+ POP      Partial        Yes      Yes                  Acute          +---------+---------------+---------+-----------+----------+--------------+ PTV      Full                                                        +---------+---------------+---------+-----------+----------+--------------+ PERO     None                                         Acute          +---------+---------------+---------+-----------+----------+--------------+   +---------+---------------+---------+-----------+----------+--------------+ LEFT     CompressibilityPhasicitySpontaneityPropertiesThrombus Aging +---------+---------------+---------+-----------+----------+--------------+ CFV      Full           Yes      Yes                                 +---------+---------------+---------+-----------+----------+--------------+ SFJ      Full                                                        +---------+---------------+---------+-----------+----------+--------------+ FV Prox  Full                                                        +---------+---------------+---------+-----------+----------+--------------+ FV Mid   Full                                                         +---------+---------------+---------+-----------+----------+--------------+ FV DistalFull                                                        +---------+---------------+---------+-----------+----------+--------------+ PFV      Full                                                        +---------+---------------+---------+-----------+----------+--------------+ POP      Full  Yes      Yes                                 +---------+---------------+---------+-----------+----------+--------------+ PTV      Full                                                        +---------+---------------+---------+-----------+----------+--------------+ PERO     Partial                                      Acute          +---------+---------------+---------+-----------+----------+--------------+ Soleal   None                                         Acute          +---------+---------------+---------+-----------+----------+--------------+ Gastroc  None                                         Acute          +---------+---------------+---------+-----------+----------+--------------+   Acute deep venous thrombosis of right popliteal vein, right peroneal vein, left peroneal vein, left soleal vein, and left gastrocnemius vein. Please see table above for more detail.  *See table(s) above for measurements and observations. Electronically signed by Heath Lark on 01/25/2023 at 4:43:19 PM.    Final    Recent Labs    01/25/23 0459  WBC 6.1  HGB 9.5*  HCT 28.6*  PLT 163   Recent Labs    01/24/23 0520 01/25/23 0459  NA 138 139  K 3.5 3.3*  CL 104 103  CO2 23 24  GLUCOSE 94 111*  BUN 32* 32*  CREATININE 2.17* 2.37*  CALCIUM 8.1* 8.4*    Intake/Output Summary (Last 24 hours) at 01/26/2023 1351 Last data filed at 01/26/2023 1032 Gross per 24 hour  Intake 290.01 ml  Output 2715 ml  Net -2424.99 ml     Pressure Injury 01/04/23 Buttocks Right;Upper Unstageable  - Full thickness tissue loss in which the base of the injury is covered by slough (yellow, tan, gray, green or brown) and/or eschar (tan, brown or black) in the wound bed. (Active)  01/04/23 2100  Location: Buttocks  Location Orientation: Right;Upper  Staging: Unstageable - Full thickness tissue loss in which the base of the injury is covered by slough (yellow, tan, gray, green or brown) and/or eschar (tan, brown or black) in the wound bed.  Wound Description (Comments):   Present on Admission: No    Physical Exam: Vital Signs Blood pressure (!) 177/88, pulse 87, temperature 98.5 F (36.9 C), temperature source Oral, resp. rate 18, weight 104.8 kg, SpO2 99%.  Gen: no distress, normal appearing, lying in bed HEENT: oral mucosa pink and moist, NCAT Cardio: RRR, no increased work of breathing Chest: CTAB, normal effort, normal rate of breathing Abd: soft, non-distended, positive bowel sounds Ext: 1+ lower extremity edema Psych: pleasant, normal affect Skin: +R internal jugular catheter. Unstageable w/ slough and eschar  to right upper buttock with foam dressing in place, abdominal incision c/d/i Neuro: Follows commands, awake and alert Moving all 4 extremities to gravity and resistance, judgment and insight appears to be overall normal  Assessment/Plan: 1. Functional deficits which require 3+ hours per day of interdisciplinary therapy in a comprehensive inpatient rehab setting. Physiatrist is providing close team supervision and 24 hour management of active medical problems listed below. Physiatrist and rehab team continue to assess barriers to discharge/monitor patient progress toward functional and medical goals  Care Tool:  Bathing    Body parts bathed by patient: Right arm, Left arm, Chest, Front perineal area, Face   Body parts bathed by helper: Buttocks, Right upper leg, Left upper leg, Left lower leg, Right lower leg     Bathing assist Assist Level: Maximal Assistance -  Patient 24 - 49%     Upper Body Dressing/Undressing Upper body dressing   What is the patient wearing?: Pull over shirt    Upper body assist Assist Level: Minimal Assistance - Patient > 75%    Lower Body Dressing/Undressing Lower body dressing      What is the patient wearing?: Pants, Incontinence brief     Lower body assist Assist for lower body dressing: Maximal Assistance - Patient 25 - 49%     Toileting Toileting    Toileting assist Assist for toileting: Moderate Assistance - Patient 50 - 74%     Transfers Chair/bed transfer  Transfers assist  Chair/bed transfer activity did not occur: Safety/medical concerns  Chair/bed transfer assist level: Minimal Assistance - Patient > 75%     Locomotion Ambulation   Ambulation assist   Ambulation activity did not occur: Safety/medical concerns          Walk 10 feet activity   Assist  Walk 10 feet activity did not occur: Safety/medical concerns        Walk 50 feet activity   Assist Walk 50 feet with 2 turns activity did not occur: Safety/medical concerns         Walk 150 feet activity   Assist Walk 150 feet activity did not occur: Safety/medical concerns         Walk 10 feet on uneven surface  activity   Assist Walk 10 feet on uneven surfaces activity did not occur: Safety/medical concerns         Wheelchair     Assist Is the patient using a wheelchair?: Yes Type of Wheelchair: Manual (per PT documentation)    Wheelchair assist level: Dependent - Patient 0%      Wheelchair 50 feet with 2 turns activity    Assist        Assist Level: Dependent - Patient 0%   Wheelchair 150 feet activity     Assist      Assist Level: Dependent - Patient 0%   Blood pressure (!) 177/88, pulse 87, temperature 98.5 F (36.9 C), temperature source Oral, resp. rate 18, weight 104.8 kg, SpO2 99%.  Medical Problem List and Plan: 1.Debility 2/2 necrotic jejunum s/p resection              -will discuss with surgery whether patient may shower             -ELOS/Goals: 10 days modI             -Continue CIR PT/OT   2.  Antithrombotics: -DVT/anticoagulation:  Mechanical: Sequential compression devices, below knee Bilateral lower extremities             -  antiplatelet therapy: N/A due to GIB 3. Pain Management:  Oxycodone prn.  4. Mood/Behavior/Sleep: LCSW to follow for evaluation and support.              -antipsychotic agents: N/A             --Melatonin prn for insomnia.  5. Neuropsych/cognition: This patient is capable of making decisions on his own behalf. 6. Skin/Wound Care: Routine pressure relief measures. Wet to dry dressing changes BID             --Air mattress overalay for DTI on buttocks.  7. Fluids/Electrolytes/Nutrition: Strict I/O. Monitor renal status daily.  Continue nutritional supplements             --Will change ensure to [facility] instead. .  -10/17 replete potassium K-Dur to 30 mEq in the morning and 20 mEq in the evening.  It appears he got 40 mEq on 10/15 and this brought him up to 3.5 the next day.  Replete magnesium IV 2 g for hypomagnesemia recheck tomorrow   -10/18 recheck potassium and magnesium tomorrow 8. 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  9. H/o Small bowel GIST s/p resection: Complicated hx with prior GIBs, duodenal ulcers, intestinal  angioectasias  w/hemorrhage, diverticulosis w/hemorrhage, gastritis and duodenitis             --consult GI for recurrent bleed.              --continue Protonix 40 mg IV BID 10. ABLA: Due to multiple episodes of rectal bleeding due to deep and severe ulceration in rectum/rectosigmoid  --not a candidate fro anticoagulation due to risk of bleeding.  --Required multiple transfusions  10/01 --to transfuse prn Hgb<7 11. AKI on CKD: Multifactorial due to surgery, contrast and hypotension. Baseline SCr-2.0 (followed by Dr. Glenna Fellows).              --strict I/O. Monitor PVRs and cath to keep volumes <350 cc             --required CRRT followed by HD-->last HD on 10/12 and internal jugular in place.              --Was started on torsemide 10/16 due to BLE edema  -Reviewed nephrology note  12. A fib w/RVR: Monitor HR with increase in activity. Continue amiodarone    -10/17 repeat potassium and magnesium 13. R-RAS: No plans for intervention as small and felt to have minimal function   14. HTN: Monitor BP TID--continue Norvasc, hydralazine and Demadex             --monitor electrolytes for recurrent hypokalemia. Recheck Mg level in am. Add magnesium gluconate 250mg  HS  -10/17 give magnesium sulfate 2 g IV for hypomagnesia, called pharmacy to check given renal function and should be okay.    -Patient was noted to have dizziness, possible orthostatic hypotension symptoms while wearing compression to b/l LE, will ask nephrology if going down on -discussed with Dr. Mikey College current medications and monitor.  Consider increase Norvasc dose due to baseline hypertension.     01/26/2023    1:37 PM 01/26/2023    5:00 AM 01/26/2023    3:53 AM  Vitals with BMI  Weight  231 lbs   Systolic 177  155  Diastolic 88  83  Pulse 87  80      15. Unstageable with slough and eschar to right buttock: continue Medihoney with foam dressing for debridement and moderate drainage.    16. Diarrhea: Multifactorial -->continue banatrol, Will schedule lomotil qid and monitor  -10/17 change Banatrol to FiberCon  -10/18 last several BMs not described as liquid, improving  17.  Right eye irritation.  Nursing reported redness to right eye conjunctiva, no purulent drainage.  Start Lacri-Lube and monitor.   -10/18 appears a little improved today  18.  Bilateral DVTs  -10/18 IVC filter planned for  today per vascular, unable to anticoagulate due to risk of bleeding  LOS: 2 days A FACE TO FACE EVALUATION WAS PERFORMED  Fanny Dance 01/26/2023, 1:51 PM

## 2023-01-26 NOTE — Progress Notes (Signed)
Physical Therapy Session Note  Patient Details  Name: [name] [name] MRN: [id number] Date of Birth: 12-25-1955  Today's Date: 01/26/2023 PT Missed Time: 45 Minutes Missed Time Reason: MD hold (Comment)   Pt on bed rest with plan for IVCF today with vascular. Pt missed 45 minutes of therapy session.   Therapy Documentation Precautions:  Precautions Precautions: Fall, Other (comment) Precaution Comments: abdominal incision Restrictions Weight Bearing Restrictions: No General:      Therapy/Group: Individual Therapy  Candiss Galeana P Meshach Perry PT 01/26/2023, 7:51 AM

## 2023-01-26 NOTE — Progress Notes (Signed)
[facility] [place] PROGRESS NOTE  ASSESSMENT / PLAN: [name] [name] is a 67 y.o. male with bilateral pop-tibial DVT. Anticoagulation is contraindicated with ongoing issues with bleeding. Will plan for IVCF today in cath lab. Reviewed R/B/A with patient in detail. He is amenable   SUBJECTIVE: No complaints. Reviewed duplex findings. Reviewed rationale for IVCF placement.  OBJECTIVE: BP (!) 155/83 (BP Location: Left Arm)   Pulse 80   Temp 98.1 F (36.7 C) (Oral)   Resp 16   Wt 104.8 kg   SpO2 98%   BMI 32.22 kg/m   Intake/Output Summary (Last 24 hours) at 01/26/2023 1101 Last data filed at 01/26/2023 1032 Gross per 24 hour  Intake 527.01 ml  Output 3265 ml  Net -2737.99 ml   No complaints Regular rate and rhythm Unlabored breathing     Latest Ref Rng & Units 01/25/2023    4:59 AM 01/23/2023    6:15 AM 01/22/2023    6:29 AM  CBC  WBC 4.0 - 10.5 K/uL 6.1  5.7  6.3   Hemoglobin 13.0 - 17.0 g/dL 9.5  9.2  7.4   Hematocrit 39.0 - 52.0 % 28.6  28.3  23.2   Platelets 150 - 400 K/uL 163  155  140         Latest Ref Rng & Units 01/25/2023    4:59 AM 01/24/2023    5:20 AM 01/23/2023    6:15 AM  CMP  Glucose 70 - 99 mg/dL 161  94  91   BUN 8 - 23 mg/dL 32  32  35   Creatinine 0.61 - 1.24 mg/dL 0.96  0.45  4.09   Sodium 135 - 145 mmol/L 139  138  137   Potassium 3.5 - 5.1 mmol/L 3.3  3.5  3.1   Chloride 98 - 111 mmol/L 103  104  101   CO2 22 - 32 mmol/L 24  23  25    Calcium 8.9 - 10.3 mg/dL 8.4  8.1  7.6   Total Protein 6.5 - 8.1 g/dL 4.8     Total Bilirubin 0.3 - 1.2 mg/dL 0.5     Alkaline Phos 38 - 126 U/L 62     AST 15 - 41 U/L 27     ALT 0 - 44 U/L 43       Estimated Creatinine Clearance: 36.8 mL/min (A) (by C-G formula based on SCr of 2.37 mg/dL (H)).  Venous duplex: Acute deep venous thrombosis of right popliteal vein, right peroneal vein,  left peroneal vein, left soleal vein, and left gastrocnemius vein. Please  see table above  for more detail.    [name]. Lenell Antu, MD [facility] Phone Number: [phone] 01/26/2023 11:01 AM

## 2023-01-27 DIAGNOSIS — L089 Local infection of the skin and subcutaneous tissue, unspecified: Secondary | ICD-10-CM

## 2023-01-27 DIAGNOSIS — N179 Acute kidney failure, unspecified: Secondary | ICD-10-CM

## 2023-01-27 DIAGNOSIS — K649 Unspecified hemorrhoids: Secondary | ICD-10-CM

## 2023-01-27 LAB — LIPID PANEL
Cholesterol: 96 mg/dL (ref 0–200)
HDL: 29 mg/dL — ABNORMAL LOW (ref >40)
LDL Cholesterol: 52 mg/dL (ref 0–99)
Total CHOL/HDL Ratio: 3.3 {ratio}
Triglycerides: 75 mg/dL (ref <150)
VLDL: 15 mg/dL (ref 0–40)

## 2023-01-27 LAB — URINALYSIS, W/ REFLEX TO CULTURE (INFECTION SUSPECTED)
Bacteria, UA: NONE SEEN
Bilirubin Urine: NEGATIVE
Glucose, UA: NEGATIVE mg/dL
Hgb urine dipstick: NEGATIVE
Ketones, ur: NEGATIVE mg/dL
Leukocytes,Ua: NEGATIVE
Nitrite: NEGATIVE
Protein, ur: NEGATIVE mg/dL
Specific Gravity, Urine: 1.008 (ref 1.005–1.030)
pH: 5 (ref 5.0–8.0)

## 2023-01-27 LAB — BASIC METABOLIC PANEL
Anion gap: 11 (ref 5–15)
BUN: 42 mg/dL — ABNORMAL HIGH (ref 8–23)
CO2: 23 mmol/L (ref 22–32)
Calcium: 7.8 mg/dL — ABNORMAL LOW (ref 8.9–10.3)
Chloride: 105 mmol/L (ref 98–111)
Creatinine, Ser: 2.43 mg/dL — ABNORMAL HIGH (ref 0.61–1.24)
GFR, Estimated: 28 mL/min — ABNORMAL LOW (ref >60)
Glucose, Bld: 119 mg/dL — ABNORMAL HIGH (ref 70–99)
Potassium: 3.6 mmol/L (ref 3.5–5.1)
Sodium: 139 mmol/L (ref 135–145)

## 2023-01-27 LAB — CBC
HCT: 31.7 % — ABNORMAL LOW (ref 39.0–52.0)
Hemoglobin: 10.5 g/dL — ABNORMAL LOW (ref 13.0–17.0)
MCH: 29.5 pg (ref 26.0–34.0)
MCHC: 33.1 g/dL (ref 30.0–36.0)
MCV: 89 fL (ref 80.0–100.0)
Platelets: 164 10*3/uL (ref 150–400)
RBC: 3.56 MIL/uL — ABNORMAL LOW (ref 4.22–5.81)
RDW: 14.6 % (ref 11.5–15.5)
WBC: 6 10*3/uL (ref 4.0–10.5)
nRBC: 0 % (ref 0.0–0.2)

## 2023-01-27 LAB — MAGNESIUM: Magnesium: 1.5 mg/dL — ABNORMAL LOW (ref 1.7–2.4)

## 2023-01-27 MED ORDER — MAGNESIUM SULFATE 2 GM/50ML IV SOLN
2.0000 g | Freq: Once | INTRAVENOUS | Status: AC
  Administered 2023-01-27: 2 g via INTRAVENOUS
  Filled 2023-01-27: qty 50

## 2023-01-27 MED ORDER — HYDROCORTISONE (PERIANAL) 2.5 % EX CREA
TOPICAL_CREAM | Freq: Three times a day (TID) | CUTANEOUS | Status: DC
  Filled 2023-01-27: qty 28.35

## 2023-01-27 MED ORDER — DOXYCYCLINE HYCLATE 100 MG PO TABS
100.0000 mg | ORAL_TABLET | Freq: Two times a day (BID) | ORAL | Status: DC
  Administered 2023-01-27 – 2023-01-29 (×4): 100 mg via ORAL
  Filled 2023-01-27 (×5): qty 1

## 2023-01-27 MED ORDER — SODIUM CHLORIDE 0.9 % IV SOLN: INTRAVENOUS | Status: AC | PRN

## 2023-01-27 NOTE — IPOC Note (Signed)
Overall Plan of Care [facility]) Patient Details Name: [name] [name] MRN: [id number] DOB: January 06, 1956  Admitting Diagnosis: Debility  Hospital Problems: Principal Problem:   Debility     Functional Problem List: Nursing Bladder, Bowel, Safety, Edema, Endurance, Medication Management, Nutrition, Pain, Skin Integrity  PT Balance, Edema, Endurance, Motor, Skin Integrity  OT    SLP    TR         Basic ADL's: OT       Advanced  ADL's: OT       Transfers: PT Bed Mobility, Bed to Chair, Car  OT       Locomotion: PT Ambulation, Wheelchair Mobility     Additional Impairments: OT    SLP        TR      Anticipated Outcomes Item Anticipated Outcome  Self Feeding    Swallowing      Basic self-care     Toileting      Bathroom Transfers    Bowel/Bladder  regain continence of bowel/bladder  Transfers  mod I  Locomotion  mod I  Communication     Cognition     Pain  less than 4  Safety/Judgment  no additional skin breakdown   Therapy Plan: PT Intensity: Minimum of 1-2 x/day ,45 to 90 minutes PT Frequency: 5 out of 7 days, Total of 15 hours over 7 days of combined therapies PT Duration Estimated Length of Stay: 10-14 days       Team Interventions: Nursing Interventions Patient/Family Education, Medication Management, Psychosocial Support, Bladder Management, Bowel Management, Skin Care/Wound Management, Disease Management/Prevention, Pain Management, Discharge Planning  PT interventions Ambulation/gait training, Discharge planning, Functional mobility training, Psychosocial support, Therapeutic Activities, Visual/perceptual remediation/compensation, Balance/vestibular training, Disease management/prevention, Neuromuscular re-education, Skin care/wound management, Therapeutic Exercise, Wheelchair propulsion/positioning, Cognitive remediation/compensation, DME/adaptive equipment instruction, Pain management, Splinting/orthotics, UE/LE Strength taining/ROM, Community  reintegration, Development worker, international aid stimulation, Patient/family education, Museum/gallery curator, UE/LE Coordination activities  OT Interventions    SLP Interventions    TR Interventions    SW/CM Interventions Discharge Planning, Psychosocial Support, Patient/Family Education   Barriers to Discharge MD  Medical stability  Nursing Decreased caregiver support, Home environment access/layout, Incontinence, Wound Care, Medication compliance home with spouse 1 level with 1 step entry  PT Wound Care, Weight orthostatic hyptotension, dizziness, poor endurance  OT      SLP      SW       Team Discharge Planning: Destination: PT-Home ,OT-   , SLP-  Projected Follow-up: PT- , OT-   , SLP-  Projected Equipment Needs: PT- , OT-  , SLP-  Equipment Details: PT-pt has walker, shower chiar, handicapped bars, handheld shower head, OT-  Patient/family involved in discharge planning: PT- Patient,  OT- , SLP-   MD ELOS: 10-14 Medical Rehab Prognosis:  Good Assessment: The patient has been admitted for CIR therapies with the diagnosis of Debility 2/2 necrotic jejunum s/p resection . The team will be addressing functional mobility, strength, stamina, balance, safety, adaptive techniques and equipment, self-care, bowel and bladder mgt, patient and caregiver education. Goals have been set at Mod I. Anticipated discharge destination is home.        See Team Conference Notes for weekly updates to the plan of care

## 2023-01-27 NOTE — Progress Notes (Signed)
PROGRESS NOTE   Subjective/Complaints: Pt with Rash over L elbow and L lower back. Reports this has been present for several days, starting before his admission. He says it was seen during acute care and he didn't thing there was anything to do for it. Reports some irritation around buttocks due to loose Bms.   ROS: Patient denies fever, chills,  dizziness, nausea, vomiting, cough, shortness of breath or chest pain, headache, or mood change. + Generalized fatigue + Diarrhea-improving +Rash   Objective:   PERIPHERAL VASCULAR CATHETERIZATION  Result Date: 01/26/2023 DATE OF SERVICE: 01/26/2023  PATIENT:  [name] [name]  67 y.o. male  PRE-OPERATIVE DIAGNOSIS:  DVT, contraindication to anticoagulation  POST-OPERATIVE DIAGNOSIS:  Same  PROCEDURE:  Placement of inferior vena cava filter Conscious sedation (21 minutes)  SURGEON:  Surgeons and Role:    * Leonie Douglas, MD - Primary  ASSISTANT: none  ANESTHESIA:   local and IV sedation  EBL: minimal  BLOOD ADMINISTERED:none  DRAINS: none  LOCAL MEDICATIONS USED:  LIDOCAINE  SPECIMEN:  none  COUNTS: confirmed correct.  TOURNIQUET:  none  PATIENT DISPOSITION:  PACU - hemodynamically stable.  Delay start of Pharmacological VTE agent (>24hrs) due to surgical blood loss or risk of bleeding: no  INDICATION FOR PROCEDURE: LAL HARPENAU is a 67 y.o. male with DVT and contraindication to anticoagulation. After careful discussion of risks, benefits, and alternatives the patient was offered IVC filter. The patient understood and wished to proceed.  OPERATIVE FINDINGS: successful deployment of IVCF  DESCRIPTION OF PROCEDURE: After identification of the patient in the pre-operative holding area, the patient was transferred to the operating room. The patient was positioned supine on the operating room table. Anesthesia was induced. The groins were prepped and draped in standard fashion. A surgical pause was  performed confirming correct patient, procedure, and operative location.  Using ultrasound guidance, the right common femoral vein was accessed with micropuncture technique.  Using Seldinger technique, a micro sheath was introduced into the common femoral vein.  A Bentson wire was navigated into the inferior vena cava.  The tract was dilated.  A Cook select system was prepared per [employer] instructions and brought onto the field.  The sheath was delivered into the infrarenal IVC.  Central venogram was performed and the renal anatomy was marked.  An IVC filter was then deployed well below the renal veins.  A follow-up venogram confirmed good position.  All endovascular equipment was removed.  Manual pressure was held on the groin.  Hemostasis was achieved.  A bandage was applied.  Upon completion of the case instrument and sharps counts were confirmed correct. The patient was transferred to the PACU in good condition. I was present for all portions of the procedure.  FOLLOW UP PLAN: Assuming a normal postoperative course, we will remove the IVC filter as soon as he can be anticoagulated safely.  [name]. Lenell Antu, MD [facility] Phone Number: [phone] 01/26/2023 2:26 PM     VAS [place] LOWER EXTREMITY VENOUS (DVT)  Result Date: 01/25/2023  Lower Venous DVT Study Patient Name:  [name] [name]  Date of Exam:  01/25/2023 Medical Rec #: [id number]      Accession #:    [id number] Date of Birth: [birth year]      Patient Gender: M Patient Age:   62 years Exam Location:  [facility] Procedure:      VAS [place] LOWER EXTREMITY VENOUS (DVT) Referring Phys: PAMELA LOVE --------------------------------------------------------------------------------  Indications: Immobility.  Risk Factors: None identified. Limitations: Poor ultrasound/tissue interface. Comparison Study: No prior studies. Performing Technologist: Chanda Busing RVT  Examination Guidelines: A complete  evaluation includes B-mode imaging, spectral Doppler, color Doppler, and power Doppler as needed of all accessible portions of each vessel. Bilateral testing is considered an integral part of a complete examination. Limited examinations for reoccurring indications may be performed as noted. The reflux portion of the exam is performed with the patient in reverse Trendelenburg.  +---------+---------------+---------+-----------+----------+--------------+ RIGHT    CompressibilityPhasicitySpontaneityPropertiesThrombus Aging +---------+---------------+---------+-----------+----------+--------------+ CFV      Full           Yes      Yes                                 +---------+---------------+---------+-----------+----------+--------------+ SFJ      Full                                                        +---------+---------------+---------+-----------+----------+--------------+ FV Prox  Full                                                        +---------+---------------+---------+-----------+----------+--------------+ FV Mid   Full                                                        +---------+---------------+---------+-----------+----------+--------------+ FV DistalFull                                                        +---------+---------------+---------+-----------+----------+--------------+ PFV      Full           Yes      Yes                                 +---------+---------------+---------+-----------+----------+--------------+ POP      Partial        Yes      Yes                  Acute          +---------+---------------+---------+-----------+----------+--------------+ PTV      Full                                                        +---------+---------------+---------+-----------+----------+--------------+  PERO     None                                         Acute           +---------+---------------+---------+-----------+----------+--------------+   +---------+---------------+---------+-----------+----------+--------------+ LEFT     CompressibilityPhasicitySpontaneityPropertiesThrombus Aging +---------+---------------+---------+-----------+----------+--------------+ CFV      Full           Yes      Yes                                 +---------+---------------+---------+-----------+----------+--------------+ SFJ      Full                                                        +---------+---------------+---------+-----------+----------+--------------+ FV Prox  Full                                                        +---------+---------------+---------+-----------+----------+--------------+ FV Mid   Full                                                        +---------+---------------+---------+-----------+----------+--------------+ FV DistalFull                                                        +---------+---------------+---------+-----------+----------+--------------+ PFV      Full                                                        +---------+---------------+---------+-----------+----------+--------------+ POP      Full           Yes      Yes                                 +---------+---------------+---------+-----------+----------+--------------+ PTV      Full                                                        +---------+---------------+---------+-----------+----------+--------------+ PERO     Partial                                      Acute          +---------+---------------+---------+-----------+----------+--------------+  Soleal   None                                         Acute          +---------+---------------+---------+-----------+----------+--------------+ Gastroc  None                                         Acute           +---------+---------------+---------+-----------+----------+--------------+   Acute deep venous thrombosis of right popliteal vein, right peroneal vein, left peroneal vein, left soleal vein, and left gastrocnemius vein. Please see table above for more detail.  *See table(s) above for measurements and observations. Electronically signed by Heath Lark on 01/25/2023 at 4:43:19 PM.    Final    Recent Labs    01/25/23 0459 01/27/23 0735  WBC 6.1 6.0  HGB 9.5* 10.5*  HCT 28.6* 31.7*  PLT 163 164   Recent Labs    01/25/23 0459 01/27/23 0735  NA 139 139  K 3.3* 3.6  CL 103 105  CO2 24 23  GLUCOSE 111* 119*  BUN 32* 42*  CREATININE 2.37* 2.43*  CALCIUM 8.4* 7.8*    Intake/Output Summary (Last 24 hours) at 01/27/2023 1238 Last data filed at 01/27/2023 1154 Gross per 24 hour  Intake 720 ml  Output 3950 ml  Net -3230 ml     Pressure Injury 01/04/23 Buttocks Right;Upper Unstageable - Full thickness tissue loss in which the base of the injury is covered by slough (yellow, tan, gray, green or brown) and/or eschar (tan, brown or black) in the wound bed. (Active)  01/04/23 2100  Location: Buttocks  Location Orientation: Right;Upper  Staging: Unstageable - Full thickness tissue loss in which the base of the injury is covered by slough (yellow, tan, gray, green or brown) and/or eschar (tan, brown or black) in the wound bed.  Wound Description (Comments):   Present on Admission: No    Physical Exam: Vital Signs Blood pressure (!) 167/80, pulse 75, temperature 98.1 F (36.7 C), resp. rate 19, weight 104.6 kg, SpO2 99%.  Gen: no distress, normal appearing, lying in bed HEENT: oral mucosa pink and moist, NCAT Cardio: RRR, no increased work of breathing Chest: CTAB, normal effort, normal rate of breathing Abd: soft, non-distended, positive bowel sounds Ext: 1+ lower extremity edema Psych: pleasant, normal affect Skin: +R internal jugular catheter. Unstageable w/ slough and eschar to  right upper buttock with foam dressing in place, abdominal incision c/d/I Rash noted L back and L arm- increased warmth, see images  Neuro: Follows commands, awake and alert Moving all 4 extremities to gravity and resistance, judgment and insight appears to be overall normal        Assessment/Plan: 1. Functional deficits which require 3+ hours per day of interdisciplinary therapy in a comprehensive inpatient rehab setting. Physiatrist is providing close team supervision and 24 hour management of active medical problems listed below. Physiatrist and rehab team continue to assess barriers to discharge/monitor patient progress toward functional and medical goals  Care Tool:  Bathing    Body parts bathed by patient: Right arm, Left arm, Chest, Front perineal area, Face   Body parts bathed by helper: Buttocks, Right upper leg, Left upper leg, Left lower leg, Right lower leg     Bathing assist  Assist Level: Maximal Assistance - Patient 24 - 49%     Upper Body Dressing/Undressing Upper body dressing   What is the patient wearing?: Pull over shirt    Upper body assist Assist Level: Minimal Assistance - Patient > 75%    Lower Body Dressing/Undressing Lower body dressing      What is the patient wearing?: Pants, Incontinence brief     Lower body assist Assist for lower body dressing: Maximal Assistance - Patient 25 - 49%     Toileting Toileting    Toileting assist Assist for toileting: Moderate Assistance - Patient 50 - 74%     Transfers Chair/bed transfer  Transfers assist  Chair/bed transfer activity did not occur: Safety/medical concerns  Chair/bed transfer assist level: Minimal Assistance - Patient > 75%     Locomotion Ambulation   Ambulation assist   Ambulation activity did not occur: Safety/medical concerns          Walk 10 feet activity   Assist  Walk 10 feet activity did not occur: Safety/medical concerns        Walk 50 feet  activity   Assist Walk 50 feet with 2 turns activity did not occur: Safety/medical concerns         Walk 150 feet activity   Assist Walk 150 feet activity did not occur: Safety/medical concerns         Walk 10 feet on uneven surface  activity   Assist Walk 10 feet on uneven surfaces activity did not occur: Safety/medical concerns         Wheelchair     Assist Is the patient using a wheelchair?: Yes Type of Wheelchair: Manual (per PT documentation)    Wheelchair assist level: Dependent - Patient 0%      Wheelchair 50 feet with 2 turns activity    Assist        Assist Level: Dependent - Patient 0%   Wheelchair 150 feet activity     Assist      Assist Level: Dependent - Patient 0%   Blood pressure (!) 167/80, pulse 75, temperature 98.1 F (36.7 C), resp. rate 19, weight 104.6 kg, SpO2 99%.  Medical Problem List and Plan: 1.Debility 2/2 necrotic jejunum s/p resection             -will discuss with surgery whether patient may shower             -ELOS/Goals: 10 days modI             -Continue CIR PT/OT   2.  Antithrombotics: -DVT/anticoagulation:  Mechanical: Sequential compression devices, below knee Bilateral lower extremities             -antiplatelet therapy: N/A due to GIB 3. Pain Management:  Oxycodone prn.  4. Mood/Behavior/Sleep: LCSW to follow for evaluation and support.              -antipsychotic agents: N/A             --Melatonin prn for insomnia.  5. Neuropsych/cognition: This patient is capable of making decisions on his own behalf. 6. Skin/Wound Care: Routine pressure relief measures. Wet to dry dressing changes BID             --Air mattress overalay for DTI on buttocks.  7. Fluids/Electrolytes/Nutrition: Strict I/O. Monitor renal status daily.  Continue nutritional supplements             --Will change ensure to [facility] instead. .  -10/17 replete potassium  K-Dur to 30 mEq in the morning and 20 mEq in the evening.  It  appears he got 40 mEq on 10/15 and this brought him up to 3.5 the next day.  Replete magnesium IV 2 g for hypomagnesemia recheck tomorrow   -10/19 potassium improved to 3.6, magnesium 1.5, repleted with magnesium IV 2 g ordered 8. 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  9. H/o Small bowel GIST s/p resection: Complicated hx with prior GIBs, duodenal ulcers, intestinal  angioectasias  w/hemorrhage, diverticulosis w/hemorrhage, gastritis and duodenitis             --consult GI for recurrent bleed.              --continue Protonix 40 mg IV BID 10. ABLA: Due to multiple episodes of rectal bleeding due to deep and severe ulceration in rectum/rectosigmoid  --not a candidate fro anticoagulation due to risk of bleeding.  --Required multiple transfusions 10/01 --to transfuse prn Hgb<7 11. AKI on CKD: Multifactorial due to surgery, contrast and hypotension. Baseline SCr-2.0 (followed by Dr. Glenna Fellows).              --strict I/O. Monitor PVRs and cath to keep volumes <350 cc             --required CRRT followed by HD-->last HD on 10/12 and internal jugular in place.              --Was started on torsemide 10/16 due to BLE edema  -10/19 nephrology to hold diuretics, creatinine has been increasing  12. A fib w/RVR: Monitor HR with increase in activity. Continue amiodarone    -10/17 repeat potassium and magnesium 13. R-RAS: No plans for intervention as small and felt to have minimal function   14. HTN: Monitor BP TID--continue Norvasc, hydralazine and Demadex             --monitor electrolytes for recurrent hypokalemia. Recheck Mg level in am. Add magnesium gluconate 250mg  HS  -10/17 give magnesium sulfate 2 g IV for hypomagnesia, called pharmacy to check given renal function and should be  okay.    -Patient was noted to have dizziness, possible orthostatic hypotension symptoms while wearing compression to b/l LE, will ask nephrology if going down on -discussed with Dr. Mikey College current medications and monitor.  Consider increase Norvasc dose due to baseline hypertension.  -BP elevated at times, continue current medications for now and monitor     01/27/2023   10:29 AM 01/27/2023    6:33 AM 01/27/2023    5:00 AM  Vitals with BMI  Weight   230 lbs 10 oz  Systolic 167 151   Diastolic 80 80   Pulse 75 86       15. Unstageable with slough and eschar to right buttock: continue Medihoney with foam dressing for debridement and moderate drainage.    16. Diarrhea: Multifactorial -->continue banatrol, Will schedule lomotil qid and monitor  -10/17 change Banatrol to FiberCon  -10/18 last several BMs not described as liquid, improving  -10/19 continue Gerhard's  17.  Right eye irritation.  Nursing reported redness to right eye conjunctiva, no purulent drainage.  Start Lacri-Lube and monitor.   -10/18 appears a little improved today  18.  Bilateral DVTs  -10/19 IVC filter  placed by Dr. Sharene Skeans for DVT  19. Rash/skin infection L upper back and L elbow  -Start Doxycycline   20.  Hemorrhoids  -Anusol cream  LOS: 3 days A FACE TO FACE EVALUATION WAS PERFORMED  Fanny Dance 01/27/2023, 12:38 PM

## 2023-01-27 NOTE — Progress Notes (Signed)
[facility] Progress Note   Assessment/ Plan:   AKI on CKD IIIb: Baseline creatinine level around 2.0.  Acute kidney injury likely ischemic ATN due to ex lap surgery/IV contrast/hypotension  Started on HD 9/18 due to inability to wean from vent and worsening renal function.  He was then switched to CRRT on 9/20-24. Course complicated by GI bleed.  Started back on HD on 10/5 after nontunneled catheter placement with IR on 10/4 Reconsulted IR for a tunneled catheter- placed 10/8, appears that last HD was 10/12 Agree with not intervening on right RAS given that his right kidney is already much smaller than anticipated, discussed with VVS at that time. Likely not much function being contributed from right.    Creatinine is now improved and not needing dialysis but dont love the trend of crt at this time-  did get contrast with ivc placement on 10/18 and has been diuresed  Had been getting torsemide 40 mg daily for edema-   I dont think he is dry necessarily but will hold diuretic for a bit as we follow kidney trend If edema responds well and creatinine is stable likely remove dialysis catheter next week Acute mesenteric ischemia: Status post small bowel resection and superior mesenteric artery stent placement.  Followed by vascular surgery.  S/p OR again 9/16 for exlap and further small bowel resection, abd closed Hypertension: May improve with volume optimization.  Started amlodipine 5 mg daily.  Also on hydralazine 50 mg 3 times daily.  Continue to monitor. Hypokalemia: On oral replacement daily.  Continue to monitor.  Replace magnesium as indicated Anemia due to acute blood loss: transfuse prn for hgb <7.  PRBC's per primary team. Multiple transfusions on 10/1.  S/p EGD and colonoscopy on 10/2.  Transfusions intermittently Acute GI bleed - s/p PRBC's and GI is following.  S/p EGD and colonoscopy as above on 10/2 with severe mucosal ulceration in the rectum and rectosigmoid colon (felt  ischemic or infectious per GI) Severe protein malnutrition - nutrition per primary team; improving  Subjective:    Patient feels well today with no complaints.  Has made over 5 liters of urine last 48 hours-  BUN and crt rising daily since 10/15   Objective:   BP (!) 167/80 (BP Location: Left Arm)   Pulse 75   Temp 98.1 F (36.7 C)   Resp 19   Wt 104.6 kg   SpO2 99%   BMI 32.16 kg/m   Physical Exam: Gen:NAD, lying flat in bed CVS: normal rate Resp: Bilateral chest rise with no increased work of breathing Abd: soft, nondistended Ext: trace lower extremity edema ACCESS: tunneled internal jugular cathter R side   Labs: BMET Recent Labs  Lab 01/21/23 0500 01/22/23 0629 01/23/23 0615 01/24/23 0520 01/25/23 0459 01/27/23 0735  NA 136 139 137 138 139 139  K 3.2* 3.5 3.1* 3.5 3.3* 3.6  CL 99 100 101 104 103 105  CO2 27 25 25 23 24 23   GLUCOSE 118* 96 91 94 111* 119*  BUN 42* 39* 35* 32* 32* 42*  CREATININE 2.19* 2.55* 2.09* 2.17* 2.37* 2.43*  CALCIUM 7.5* 7.7* 7.6* 8.1* 8.4* 7.8*  PHOS 2.8  --   --   --   --   --    CBC Recent Labs  Lab 01/22/23 0629 01/23/23 0615 01/25/23 0459 01/27/23 0735  WBC 6.3 5.7 6.1 6.0  NEUTROABS  --   --  4.7  --   HGB 7.4* 9.2* 9.5* 10.5*  HCT 23.2* 28.3* 28.6* 31.7*  MCV 90.6 90.7 88.0 89.0  PLT 140* 155 163 164      Medications:     amiodarone  200 mg Oral Daily   amLODipine  5 mg Oral Daily   ascorbic acid  500 mg Oral Daily   Chlorhexidine Gluconate Cloth  6 each Topical Q12H   diphenoxylate-atropine  1 tablet Oral TID AC   Gerhardt's butt cream   Topical BID   hydrALAZINE  50 mg Oral TID   leptospermum manuka honey  1 Application Topical Daily   multivitamin  1 tablet Oral QHS   pantoprazole  40 mg Oral BID   polycarbophil  625 mg Oral BID   protein supplement  1 Scoop Oral TID WC   tamsulosin  0.8 mg Oral QPC supper   torsemide  40 mg Oral Daily

## 2023-01-27 NOTE — Progress Notes (Signed)
Occupational Therapy Session Note  Patient Details  Name: [name] [name] MRN: [id number] Date of Birth: [birth year]  {CHL IP REHAB OT TIME CALCULATIONS:[id number]}  Short Term Goals: Week 1:  OT Short Term Goal 1 (Week 1): Pt will stand up to 3 min sinkside for grooming with S OT Short Term Goal 2 (Week 1): Pt will amb with LRAD to perform toileting with CGA overall OT Short Term Goal 3 (Week 1): Pt will complete LB self care with close S with AE  Skilled Therapeutic Interventions/Progress Updates:  Pt received *** for skilled OT session with focus on ***. Pt agreeable to interventions, demonstrating overall *** mood. Pt reported ***/10 pain, stating "***" in reference to ***. OT offering intermediate rest breaks and positioning suggestions throughout session to address pain/fatigue and maximize participation/safety in session.    Pt remained *** with all immediate needs met at end of session. Pt continues to be appropriate for skilled OT intervention to promote further functional independence.   Therapy Documentation Precautions:  Precautions Precautions: Fall, Other (comment) Precaution Comments: abdominal incision Restrictions Weight Bearing Restrictions: No   Therapy/Group: Individual Therapy  Lou Cal, OTR/L, MSOT  01/27/2023, 3:10 PM

## 2023-01-27 NOTE — Progress Notes (Signed)
[facility] [place]  Subjective  -no complaints.  Denies abdominal pain.   Objective (!) 151/80 86 98.1 F (36.7 C) 20 99%  Intake/Output Summary (Last 24 hours) at 01/27/2023 0940 Last data filed at 01/27/2023 4132 Gross per 24 hour  Intake 720 ml  Output 3600 ml  Net -2880 ml    Right groin looks fine after transvenous access  Laboratory Lab Results: Recent Labs    01/25/23 0459 01/27/23 0735  WBC 6.1 6.0  HGB 9.5* 10.5*  HCT 28.6* 31.7*  PLT 163 164   BMET Recent Labs    01/25/23 0459 01/27/23 0735  NA 139 139  K 3.3* 3.6  CL 103 105  CO2 24 23  GLUCOSE 111* 119*  BUN 32* 42*  CREATININE 2.37* 2.43*  CALCIUM 8.4* 7.8*    COAG Lab Results  Component Value Date   INR 1.7 (H) 12/29/2022   No results found for: "PTT"  Assessment/Planning:  Post procedure day 1 status post IVC filter by Dr. Lenell Antu for DVT with contraindication to anticoagulation.  Right groin looks fine.  No abdominal pain.  Cephus Shelling 01/27/2023 9:40 AM --

## 2023-01-28 ENCOUNTER — Inpatient Hospital Stay (HOSPITAL_COMMUNITY): Payer: Medicare Other

## 2023-01-28 DIAGNOSIS — R339 Retention of urine, unspecified: Secondary | ICD-10-CM

## 2023-01-28 LAB — BASIC METABOLIC PANEL
Anion gap: 10 (ref 5–15)
BUN: 33 mg/dL — ABNORMAL HIGH (ref 8–23)
CO2: 23 mmol/L (ref 22–32)
Calcium: 7.8 mg/dL — ABNORMAL LOW (ref 8.9–10.3)
Chloride: 101 mmol/L (ref 98–111)
Creatinine, Ser: 2.28 mg/dL — ABNORMAL HIGH (ref 0.61–1.24)
GFR, Estimated: 30 mL/min — ABNORMAL LOW (ref >60)
Glucose, Bld: 174 mg/dL — ABNORMAL HIGH (ref 70–99)
Potassium: 3.1 mmol/L — ABNORMAL LOW (ref 3.5–5.1)
Sodium: 134 mmol/L — ABNORMAL LOW (ref 135–145)

## 2023-01-28 LAB — CBC WITH DIFFERENTIAL/PLATELET
Abs Immature Granulocytes: 0.18 10*3/uL — ABNORMAL HIGH (ref 0.00–0.07)
Basophils Absolute: 0 10*3/uL (ref 0.0–0.1)
Basophils Relative: 0 %
Eosinophils Absolute: 0 10*3/uL (ref 0.0–0.5)
Eosinophils Relative: 0 %
HCT: 29.6 % — ABNORMAL LOW (ref 39.0–52.0)
Hemoglobin: 9.8 g/dL — ABNORMAL LOW (ref 13.0–17.0)
Immature Granulocytes: 3 %
Lymphocytes Relative: 3 %
Lymphs Abs: 0.2 10*3/uL — ABNORMAL LOW (ref 0.7–4.0)
MCH: 29.1 pg (ref 26.0–34.0)
MCHC: 33.1 g/dL (ref 30.0–36.0)
MCV: 87.8 fL (ref 80.0–100.0)
Monocytes Absolute: 0.5 10*3/uL (ref 0.1–1.0)
Monocytes Relative: 8 %
Neutro Abs: 6.1 10*3/uL (ref 1.7–7.7)
Neutrophils Relative %: 86 %
Platelets: 148 10*3/uL — ABNORMAL LOW (ref 150–400)
RBC: 3.37 MIL/uL — ABNORMAL LOW (ref 4.22–5.81)
RDW: 14.5 % (ref 11.5–15.5)
WBC: 7.1 10*3/uL (ref 4.0–10.5)
nRBC: 0 % (ref 0.0–0.2)

## 2023-01-28 LAB — RENAL FUNCTION PANEL
Albumin: 2 g/dL — ABNORMAL LOW (ref 3.5–5.0)
Anion gap: 11 (ref 5–15)
BUN: 34 mg/dL — ABNORMAL HIGH (ref 8–23)
CO2: 24 mmol/L (ref 22–32)
Calcium: 7.8 mg/dL — ABNORMAL LOW (ref 8.9–10.3)
Chloride: 101 mmol/L (ref 98–111)
Creatinine, Ser: 2.31 mg/dL — ABNORMAL HIGH (ref 0.61–1.24)
GFR, Estimated: 30 mL/min — ABNORMAL LOW (ref >60)
Glucose, Bld: 118 mg/dL — ABNORMAL HIGH (ref 70–99)
Phosphorus: 3.8 mg/dL (ref 2.5–4.6)
Potassium: 3.3 mmol/L — ABNORMAL LOW (ref 3.5–5.1)
Sodium: 136 mmol/L (ref 135–145)

## 2023-01-28 LAB — LACTIC ACID, PLASMA
Lactic Acid, Venous: 0.9 mmol/L (ref 0.5–1.9)
Lactic Acid, Venous: 0.8 mmol/L (ref 0.5–1.9)

## 2023-01-28 MED ORDER — MORPHINE SULFATE (PF) 2 MG/ML IV SOLN
INTRAVENOUS | Status: AC
  Filled 2023-01-28: qty 1

## 2023-01-28 MED ORDER — POTASSIUM CHLORIDE 10 MEQ/100ML IV SOLN
10.0000 meq | INTRAVENOUS | Status: AC
  Administered 2023-01-29 (×4): 10 meq via INTRAVENOUS
  Filled 2023-01-28 (×4): qty 100

## 2023-01-28 MED ORDER — MORPHINE SULFATE (PF) 2 MG/ML IV SOLN
2.0000 mg | Freq: Once | INTRAVENOUS | Status: AC
  Administered 2023-01-28: 2 mg via INTRAVENOUS
  Filled 2023-01-28: qty 1

## 2023-01-28 MED ORDER — MORPHINE SULFATE (PF) 2 MG/ML IV SOLN
2.0000 mg | Freq: Once | INTRAVENOUS | Status: AC
  Administered 2023-01-28: 2 mg via INTRAVENOUS

## 2023-01-28 MED ORDER — MORPHINE SULFATE (PF) 2 MG/ML IV SOLN
2.0000 mg | INTRAVENOUS | Status: DC | PRN
  Administered 2023-01-28 – 2023-01-29 (×4): 2 mg via INTRAVENOUS
  Filled 2023-01-28 (×4): qty 1

## 2023-01-28 MED ORDER — POTASSIUM CHLORIDE CRYS ER 20 MEQ PO TBCR
40.0000 meq | EXTENDED_RELEASE_TABLET | Freq: Every day | ORAL | Status: DC
  Administered 2023-01-28 – 2023-01-29 (×2): 40 meq via ORAL
  Filled 2023-01-28 (×2): qty 2

## 2023-01-28 MED ORDER — DEXTROSE-SODIUM CHLORIDE 5-0.9 % IV SOLN: INTRAVENOUS | Status: DC

## 2023-01-28 MED ORDER — POTASSIUM CHLORIDE CRYS ER 20 MEQ PO TBCR: 40.0000 meq | EXTENDED_RELEASE_TABLET | Freq: Once | ORAL | Status: DC

## 2023-01-28 NOTE — Care Plan (Signed)
Plan of Care Note  Patient is known to the general surgery service.  He is s/p SMA thrombectomy and angioplasty with a stent to the distal SMA w/ vascular and ex-lap, SBR. I was notified that he had abdominal pain and arrived at bedside to evaluate.   He reports sudden onset abdominal pain earlier this afternoon that gradually built to 10/10.  Pain is currently 8-9/10.  Before this event he had been tolerating PO without pain and denies signs of blood in his bowel movements.  PE: Gen: male resting in bed, NAD Abd: soft, moderate distention, mild TTP in the epigastric region, no rebound/guarding, no peritoneal signs  He is not peritonitic on exam and does not appear toxic. In the event that he does have mesenteric ischemia from an SMA occlusion then the role for general surgery would be dictated based on whether blood flow could be restored. Per Dr. Ophelia Charter note, he has limited options in the event that his stent is thrombosed. General surgery will follow up his CT and labs and will continue to follow.  Melody Haver, MD   General Surgeon [facility], [place]

## 2023-01-28 NOTE — Progress Notes (Signed)
Received message from [facility] requesting IV pain meds around 8pm, stated pt refused oral pain meds d/t concerns of vomiting (given compazine but pt c/o severe abd pain and still refused PO med). 2mg  IV morphine dose ordered.   Received call now ([phone]) starting he is still having severe pain and still refusing PO pain meds because "they won't do anything", requesting IV med. Will order IV morphine 2mg  q2h prn for now since we are still awaiting his CT results and are unclear on the etiology of his pain-- but would suggest that he attempt PO meds once able. Will defer to AM team regarding d/c of the IV pain control meds.   Kellogg PA-C  [phone] 01/28/23

## 2023-01-28 NOTE — Progress Notes (Addendum)
PROGRESS NOTE   Subjective/Complaints: Patient continues to have rash over his left elbow and left lower back.  Rashes were present on admission but has worsened overall.  Rash looks about the same as yesterday.  Denies any significant pain or itching at these rashes.  Patient has been having urinary retention.  ROS: Patient denies fever, chills,  dizziness, nausea, vomiting, cough, shortness of breath or chest pain, headache, or mood change. + Generalized fatigue + Diarrhea-ongoing +Rash   Objective:   PERIPHERAL VASCULAR CATHETERIZATION  Result Date: 01/26/2023 DATE OF SERVICE: 01/26/2023  PATIENT:  [name] [name]  67 y.o. male  PRE-OPERATIVE DIAGNOSIS:  DVT, contraindication to anticoagulation  POST-OPERATIVE DIAGNOSIS:  Same  PROCEDURE:  Placement of inferior vena cava filter Conscious sedation (21 minutes)  SURGEON:  Surgeons and Role:    * Leonie Douglas, MD - Primary  ASSISTANT: none  ANESTHESIA:   local and IV sedation  EBL: minimal  BLOOD ADMINISTERED:none  DRAINS: none  LOCAL MEDICATIONS USED:  LIDOCAINE  SPECIMEN:  none  COUNTS: confirmed correct.  TOURNIQUET:  none  PATIENT DISPOSITION:  PACU - hemodynamically stable.  Delay start of Pharmacological VTE agent (>24hrs) due to surgical blood loss or risk of bleeding: no  INDICATION FOR PROCEDURE: [name] [name] is a 67 y.o. male with DVT and contraindication to anticoagulation. After careful discussion of risks, benefits, and alternatives the patient was offered IVC filter. The patient understood and wished to proceed.  OPERATIVE FINDINGS: successful deployment of IVCF  DESCRIPTION OF PROCEDURE: After identification of the patient in the pre-operative holding area, the patient was transferred to the operating room. The patient was positioned supine on the operating room table. Anesthesia was induced. The groins were prepped and draped in standard fashion. A surgical pause was  performed confirming correct patient, procedure, and operative location.  Using ultrasound guidance, the right common femoral vein was accessed with micropuncture technique.  Using Seldinger technique, a micro sheath was introduced into the common femoral vein.  A Bentson wire was navigated into the inferior vena cava.  The tract was dilated.  A Cook select system was prepared per [employer] instructions and brought onto the field.  The sheath was delivered into the infrarenal IVC.  Central venogram was performed and the renal anatomy was marked.  An IVC filter was then deployed well below the renal veins.  A follow-up venogram confirmed good position.  All endovascular equipment was removed.  Manual pressure was held on the groin.  Hemostasis was achieved.  A bandage was applied.  Upon completion of the case instrument and sharps counts were confirmed correct. The patient was transferred to the PACU in good condition. I was present for all portions of the procedure.  FOLLOW UP PLAN: Assuming a normal postoperative course, we will remove the IVC filter as soon as he can be anticoagulated safely.  [name]. Lenell Antu, MD FACS [facility] Phone Number: [phone] 01/26/2023 2:26 PM     Recent Labs    01/27/23 0735  WBC 6.0  HGB 10.5*  HCT 31.7*  PLT 164   Recent Labs    01/27/23 0735 01/28/23 5784  NA 139 136  K 3.6 3.3*  CL 105 101  CO2 23 24  GLUCOSE 119* 118*  BUN 42* 34*  CREATININE 2.43* 2.31*  CALCIUM 7.8* 7.8*    Intake/Output Summary (Last 24 hours) at 01/28/2023 1029 Last data filed at 01/28/2023 7846 Gross per 24 hour  Intake 360 ml  Output 3400 ml  Net -3040 ml     Pressure Injury 01/04/23 Buttocks Right;Upper Unstageable - Full thickness tissue loss in which the base of the injury is covered by slough (yellow, tan, gray, green or brown) and/or eschar (tan, brown or black) in the wound bed. (Active)  01/04/23 2100  Location:  Buttocks  Location Orientation: Right;Upper  Staging: Unstageable - Full thickness tissue loss in which the base of the injury is covered by slough (yellow, tan, gray, green or brown) and/or eschar (tan, brown or black) in the wound bed.  Wound Description (Comments):   Present on Admission: No    Physical Exam: Vital Signs Blood pressure (!) 161/79, pulse 81, temperature 97.7 F (36.5 C), temperature source Oral, resp. rate 18, weight 104.5 kg, SpO2 98%.  Gen: no distress, normal appearing, lying in bed HEENT: oral mucosa pink and moist, NCAT Cardio: RRR, no increased work of breathing Chest: CTAB, normal effort, normal rate of breathing Abd: soft, non-distended, positive bowel sounds Ext: 1+ lower extremity edema Psych: pleasant, normal affect Skin: +R internal jugular catheter. Unstageable w/ slough and eschar to right upper buttock with foam dressing in place, abdominal incision covered with dressing that is clean and dry. Rash noted L back and L arm- increased warmth at the left arm, see images  Neuro: Follows commands, awake and alert Moving all 4 extremities to gravity and resistance, judgment and insight appears to be overall normal, sensation to light touch intact in all 4 extremities Not able to recall issues with urinary retention before admission to CIR       Assessment/Plan: 1. Functional deficits which require 3+ hours per day of interdisciplinary therapy in a comprehensive inpatient rehab setting. Physiatrist is providing close team supervision and 24 hour management of active medical problems listed below. Physiatrist and rehab team continue to assess barriers to discharge/monitor patient progress toward functional and medical goals  Care Tool:  Bathing    Body parts bathed by patient: Right arm, Left arm, Chest, Front perineal area, Face   Body parts bathed by helper: Buttocks, Right upper leg, Left upper leg, Left lower leg, Right lower leg     Bathing  assist Assist Level: Maximal Assistance - Patient 24 - 49%     Upper Body Dressing/Undressing Upper body dressing   What is the patient wearing?: Pull over shirt    Upper body assist Assist Level: Minimal Assistance - Patient > 75%    Lower Body Dressing/Undressing Lower body dressing      What is the patient wearing?: Pants, Incontinence brief     Lower body assist Assist for lower body dressing: Moderate Assistance - Patient 50 - 74%     Toileting Toileting    Toileting assist Assist for toileting: Contact Guard/Touching assist     Transfers Chair/bed transfer  Transfers assist  Chair/bed transfer activity did not occur: Safety/medical concerns  Chair/bed transfer assist level: Minimal Assistance - Patient > 75%     Locomotion Ambulation   Ambulation assist   Ambulation activity did not occur: Safety/medical concerns          Walk 10 feet activity   Assist  Walk 10 feet activity did not occur: Safety/medical concerns        Walk 50 feet activity   Assist Walk 50 feet with 2 turns activity did not occur: Safety/medical concerns         Walk 150 feet activity   Assist Walk 150 feet activity did not occur: Safety/medical concerns         Walk 10 feet on uneven surface  activity   Assist Walk 10 feet on uneven surfaces activity did not occur: Safety/medical concerns         Wheelchair     Assist Is the patient using a wheelchair?: Yes Type of Wheelchair: Manual (per PT documentation)    Wheelchair assist level: Dependent - Patient 0%      Wheelchair 50 feet with 2 turns activity    Assist        Assist Level: Dependent - Patient 0%   Wheelchair 150 feet activity     Assist      Assist Level: Dependent - Patient 0%   Blood pressure (!) 161/79, pulse 81, temperature 97.7 F (36.5 C), temperature source Oral, resp. rate 18, weight 104.5 kg, SpO2 98%.  Medical Problem List and Plan: 1.Debility 2/2  necrotic jejunum s/p resection             -will discuss with surgery whether patient may shower             -ELOS/Goals: 10 days modI             -Continue CIR PT/OT   2.  Antithrombotics: -DVT/anticoagulation:  Mechanical: Sequential compression devices, below knee Bilateral lower extremities             -antiplatelet therapy: N/A due to GIB 3. Pain Management:  Oxycodone prn.  4. Mood/Behavior/Sleep: LCSW to follow for evaluation and support.              -antipsychotic agents: N/A             --Melatonin prn for insomnia.  5. Neuropsych/cognition: This patient is capable of making decisions on his own behalf. 6. Skin/Wound Care: Routine pressure relief measures. Wet to dry dressing changes BID             --Air mattress overalay for DTI on buttocks.  7. Fluids/Electrolytes/Nutrition: Strict I/O. Monitor renal status daily.  Continue nutritional supplements             --Will change ensure to [facility] instead. .  -10/17 replete potassium K-Dur to 30 mEq in the morning and 20 mEq in the evening.  It appears he got 40 mEq on 10/15 and this brought him up to 3.5 the next day.  Replete magnesium IV 2 g for hypomagnesemia recheck tomorrow   -10/19 potassium improved to 3.6, magnesium 1.5, repleted with magnesium IV 2 g ordered  10/20 patient was started on 40 mEq Klor-Con M by nephrology, recheck magnesium tomorrow 8. 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   -Denies abdominal pain 9. H/o Small bowel GIST s/p resection: Complicated hx with prior GIBs, duodenal ulcers, intestinal  angioectasias  w/hemorrhage, diverticulosis w/hemorrhage, gastritis and duodenitis             --consult GI for recurrent  bleed.              --continue Protonix 40 mg IV BID 10. ABLA: Due to  multiple episodes of rectal bleeding due to deep and severe ulceration in rectum/rectosigmoid  --not a candidate fro anticoagulation due to risk of bleeding.  --Required multiple transfusions 10/01 --to transfuse prn Hgb<7 -Recheck tomorrow 11. AKI on CKD: Multifactorial due to surgery, contrast and hypotension. Baseline SCr-2.0 (followed by Dr. Glenna Fellows).              --strict I/O. Monitor PVRs and cath to keep volumes <350 cc             --required CRRT followed by HD-->last HD on 10/12 and internal jugular in place.              --Was started on torsemide 10/16 due to BLE edema  -10/19 nephrology to hold diuretics, creatinine has been increasing  -10/20 creatinine trending back down today, nephrology following 12. A fib w/RVR: Monitor HR with increase in activity. Continue amiodarone    -10/17 repeat potassium and magnesium 13. R-RAS: No plans for intervention as small and felt to have minimal function   14. HTN: Monitor BP TID--continue Norvasc, hydralazine and Demadex             --monitor electrolytes for recurrent hypokalemia. Recheck Mg level in am. Add magnesium gluconate 250mg  HS  -10/17 give magnesium sulfate 2 g IV for hypomagnesia, called pharmacy to check given renal function and should be okay.    -Patient was noted to have dizziness, possible orthostatic hypotension symptoms while wearing compression to b/l LE, will ask nephrology if going down on -discussed with Dr. Mikey College current medications and monitor.  Consider increase Norvasc dose due to baseline hypertension.  -BP intermittently elevated, continue compression garments for orthostatic hypotension     01/28/2023    5:00 AM 01/28/2023    3:48 AM 01/27/2023    9:17 PM  Vitals with BMI  Weight 230 lbs 6 oz    Systolic  161 175  Diastolic  79 83  Pulse  81 84      15. Unstageable with slough and eschar to right buttock: continue Medihoney with foam dressing for debridement and moderate drainage.    16.  Diarrhea: Multifactorial -->continue banatrol, Will schedule lomotil qid and monitor  -10/17 change Banatrol to FiberCon  -10/18 last several BMs not described as liquid, improving  -10/19 continue Gerhard's  -10/20 abdominal x-ray ordered  17.  Right eye irritation.  Nursing reported redness to right eye conjunctiva, no purulent drainage.  Start Lacri-Lube and monitor.   -10/18 appears a little improved today  18.  Bilateral DVTs  -10/19 IVC filter placed by Dr. Sharene Skeans for DVT  19. Rash/skin infection L upper back and L elbow  -Start Doxycycline 10/19  -10/20 appears stable from yesterday, rash has very defined edges and rectangular shape?  Not sure if there was prior contact with something in this area, continue to monitor  20.  Hemorrhoids  -Anusol cream  21.  Urinary retention  -10/20 check abdominal x-ray, continue regular bladder scans and catheterization adjust to every 4 hours.  Discussed with nursing trying to get patient up out of bed to urinate instead of doing it in the bed.  UA negative for signs of infection. Patient was having issues with urinary retention while at the acute hospital requiring intermittent caths and had Flomax was started and titrated up to 0.8 mg.  Cautious to  add additional medications due to orthostatic hypotension.  Addendum: Notified by nursing of abdominal pain that began this evening.  Abdomen nondistended and tender no rebound or guarding.  Discussed with vascular surgery.  Discussed with IM-appreciate.  CT abdomen pelvis without contrast ordered.  CBC BMP lactic acid ordered.  Will give one-time IV morphine due to pain.  Limited options if ischemia is present.  N.p.o. status.  Also notified general surgery will come evaluate patient today.  Appreciate assistance vascular and surgery.  LOS: 4 days A FACE TO FACE EVALUATION WAS PERFORMED  Fanny Dance 01/28/2023, 10:29 AM

## 2023-01-28 NOTE — Progress Notes (Signed)
Physical Therapy Session Note  Patient Details  Name: [name] [name] MRN: [id number] Date of Birth: May 22, 1955  Today's Date: 01/28/2023 PT Individual Time: 1000-1058, 1300-1405 PT Individual Time Calculation (min): 58 min, 65 min  PT missed time: 10 min (fatigue)   Short Term Goals: Week 1:  PT Short Term Goal 1 (Week 1): Pt will tolerate sitting OOB for 3 hours each day PT Short Term Goal 2 (Week 1): Pt will ambulate 50 feet or more with LRAD and min A PT Short Term Goal 3 (Week 1): Pt will perform stand pivot transfer with LRAD and CGA  Skilled Therapeutic Interventions/Progress Updates:      Treatment Session 1  Pt supine in bed upon arrival. Pt agreeable to therapy. Pt denies any pain.   Supine to sit with HOB elevated and supervision. Sit to stand with no AD and CGA, gait 1 x~10, 1x40 feet with no AD and L HHA, verbal cues provided for not crossing feet.   Pt ambulated up and down ramp with no AD and min A with R UE support on handrail, verbal cues provided for navigating turn with emphasis on not crossing legs.   Pt performed stand pivot transfer to car simulator at height of SUV with CGA.   Pt picked up pen from floor with no AD and min A. Education provided that pt may need to purchase a reacher upon discharge for safety.   Pt performed bed mobility on apartment bed with supervision, pt demos B UE tremors with supine to sit, diminished with seated rest break.   Ambulatory transfer to recliner and couch with CGA, verbal cues  provided for technique.   Pt reports mild dizziness throughout session, as well as fatigue however did not interfere with session. Pt seated BP 176/95, standing BP 119/72, return to sitting 178/96.   Pt self propelled WC 150+ feet with min A progressing to supervision, verbal cues provided for technique with steering and energy conservation.   Therapist encouraged to sit up in [facility] until next PT session at 1. Pt requesting to return to bed 2/2 frequent  incontinence/diarrhea today, but agreeable to keeping HOB up for improved upright tolerance.   Pt supine in bed with HOB elevated and bed alarm on within needs within reach.   Treatment Session 2   Pt supine in bed with HOB elevated upon arrival. Pt agreeable to therapy. Pt denies any pain.   Pt requesting to use bathroom. Pt ambulated to bathroom with no AD and CGA. Pt attempted to urinate, but unable.   Pt returned to bed and donned shorts while sitting EOB. Pt reports moderate dizziness with bending over to put on shorts, resolved with seated rest break.   Pt self propelled WC 150+ feet with B UE support and supervision, verbal cues provided for technique with steering, and for energy conservation.   Pt performed step ups x10 bilaterally on 6 inch steps with B UE support and CGA. Pt ascended/descended 1x4 6 inch steps with B UE support and CGA, pt required seated rest break 2/2 dizziness, resolved with seated rest break.   Pt ambulated 2x70 feet with no AD and CGA, verbal cues provided for arm swing bilaterally,  pt required prolonged seated rest break between gait trails 2/2 fatigue. Pt reports being "exhausted" after 2nd gait trial and requesting to return to room.   Pt transported dependent in [facility] to room, pt performed stand pivot transfer with close supervision with no AD. Pt supine in bed  with HOB elevated and needs within reach with bed alarm on.   Pt missed 10 minutes 2/2 fatigue.      Therapy Documentation Precautions:  Precautions Precautions: Fall, Other (comment) Precaution Comments: abdominal incision Restrictions Weight Bearing Restrictions: No  Therapy/Group: Individual Therapy  [facility] Watchung, [place], DPT  01/28/2023, 7:45 AM

## 2023-01-28 NOTE — Progress Notes (Signed)
Pt does not want to take any oral meds. Reported that he just vomited. He added that if he take any oral med, he would just throw up. Mercedes aware.

## 2023-01-28 NOTE — Progress Notes (Signed)
Pt c/o 10/10 sudden onset of upper abdominal pain. Describing pain as sharp. Pt is SOB and painful for supine position. Pt lying on side for relief. Pt reports 9/10 pain when lying on side or lying supine with leg elevated on opposite knee.. MD Shtridelman notified.

## 2023-01-28 NOTE — Progress Notes (Signed)
67 year old male well-known to vascular surgery now in CIR.  Vascular was called this evening as patient has had increasing abdominal pain.  Patient previously presented with acute on chronic mesenteric ischemia and underwent percutaneous suction thrombectomy of the SMA with angioplasty and stenting in the distal SMA using a 3.5 mm stent on 12/21/2022.  Also required an ex lap and small bowel resection with general surgery.  States his pain has been slowly increasing throughout the afternoon more upper midline.  He does not have any frank peritonitis at this time with no rebound or guarding.  Does not feel this is as severe as initial presentation.   Given his renal dysfunction this admission briefly requiring dialysis with AKI on CKD 3B would like to avoid any additional contrast as his renal function has been recovering.  Have recommended a CT abdomen pelvis without contrast.  Will also get updated labs with CBC BMP lactic acid.  As previously documented by Dr. Karin Lieu worst-case scenario would be his stent thrombosing given has been off anticoagulation and antiplatelet therapy for GI bleed with ischemic proctitis.  Very limited options in that scenario.  Will evaluate for other etiology of his pain as well.  Cephus Shelling, MD [facility] [place] Office: [phone]   Cephus Shelling

## 2023-01-28 NOTE — Progress Notes (Signed)
[facility] Progress Note   Assessment/ Plan:   AKI on CKD IIIb: Baseline creatinine level around 2.0.  Acute kidney injury likely ischemic ATN due to ex lap surgery/IV contrast/hypotension  Started on HD 9/18 due to inability to wean from vent and worsening renal function.  He was then switched to CRRT on 9/20-24. Course complicated by GI bleed.  Started back on HD on 10/5   tunneled catheter- placed 10/8, appears that last HD was 10/12 Agree with not intervening on right RAS given that his right kidney is already much smaller than anticipated, discussed with VVS at that time. Likely not much function being contributed from right.    Creatinine is now improved and not needing dialysis but dont love the trend of crt at this time-  did get contrast with ivc placement on 10/18 and has been diuresed -  today trended in right direction but having issue with urinary retention-  hopefully temporary Had been getting torsemide 40 mg daily for edema-   held diuretic for a bit as we follow kidney trend If creatinine stays stable likely remove dialysis catheter next week Acute mesenteric ischemia: Status post small bowel resection and superior mesenteric artery stent placement.  Followed by vascular surgery.  S/p OR again 9/16 for exlap and further small bowel resection, abd closed Hypertension: May improve with volume optimization.  Started amlodipine 5 mg daily.  Also on hydralazine 50 mg 3 times daily.  Continue to monitor. Hypokalemia: On oral replacement daily.  Continue to monitor.  Replace magnesium as indicated Anemia due to acute blood loss: transfuse prn for hgb <7.  PRBC's per primary team. Multiple transfusions on 10/1.  S/p EGD and colonoscopy on 10/2.  Transfusions intermittently Acute GI bleed - s/p PRBC's and GI is following.  S/p EGD and colonoscopy as above on 10/2 with severe mucosal ulceration in the rectum and rectosigmoid colon (felt ischemic or infectious per GI) Severe  protein malnutrition - nutrition per primary team; improving  Subjective:    Still with lots of UOP-  I had held the torsemide but he had already had his dose yesterday-  is currently having urinary retention-  req in and out cath overnight and has not passed urine spontaneously since -  BUN and crt did improve from yest to today   Objective:   BP (!) 161/79 (BP Location: Left Arm)   Pulse 81   Temp 97.7 F (36.5 C) (Oral)   Resp 18   Wt 104.5 kg   SpO2 98%   BMI 32.13 kg/m   Physical Exam: Gen:NAD, lying flat in bed CVS: normal rate Resp: Bilateral chest rise with no increased work of breathing Abd: soft, nondistended Ext: trace lower extremity edema ACCESS: tunneled internal jugular cathter R side   Labs: BMET Recent Labs  Lab 01/22/23 0629 01/23/23 0615 01/24/23 0520 01/25/23 0459 01/27/23 0735 01/28/23 0621  NA 139 137 138 139 139 136  K 3.5 3.1* 3.5 3.3* 3.6 3.3*  CL 100 101 104 103 105 101  CO2 25 25 23 24 23 24   GLUCOSE 96 91 94 111* 119* 118*  BUN 39* 35* 32* 32* 42* 34*  CREATININE 2.55* 2.09* 2.17* 2.37* 2.43* 2.31*  CALCIUM 7.7* 7.6* 8.1* 8.4* 7.8* 7.8*  PHOS  --   --   --   --   --  3.8   CBC Recent Labs  Lab 01/22/23 0629 01/23/23 0615 01/25/23 0459 01/27/23 0735  WBC 6.3 5.7 6.1 6.0  NEUTROABS  --   --  4.7  --   HGB 7.4* 9.2* 9.5* 10.5*  HCT 23.2* 28.3* 28.6* 31.7*  MCV 90.6 90.7 88.0 89.0  PLT 140* 155 163 164      Medications:     amiodarone  200 mg Oral Daily   amLODipine  5 mg Oral Daily   ascorbic acid  500 mg Oral Daily   Chlorhexidine Gluconate Cloth  6 each Topical Q12H   diphenoxylate-atropine  1 tablet Oral TID AC   doxycycline  100 mg Oral Q12H   Gerhardt's butt cream   Topical BID   hydrALAZINE  50 mg Oral TID   hydrocortisone   Rectal TID   leptospermum manuka honey  1 Application Topical Daily   multivitamin  1 tablet Oral QHS   pantoprazole  40 mg Oral BID   polycarbophil  625 mg Oral BID   protein supplement   1 Scoop Oral TID WC   tamsulosin  0.8 mg Oral QPC supper

## 2023-01-29 ENCOUNTER — Encounter (HOSPITAL_COMMUNITY): Payer: Self-pay

## 2023-01-29 ENCOUNTER — Encounter (HOSPITAL_COMMUNITY): Payer: Self-pay | Admitting: Vascular Surgery

## 2023-01-29 ENCOUNTER — Inpatient Hospital Stay (HOSPITAL_COMMUNITY)
Admission: AD | Admit: 2023-01-29 | Discharge: 2023-02-07 | DRG: 314 | Disposition: A | Discharge status: Hospice | Payer: Medicare Other | Source: Ambulatory Visit | Attending: Internal Medicine | Admitting: Internal Medicine

## 2023-01-29 DIAGNOSIS — I480 Paroxysmal atrial fibrillation: Secondary | ICD-10-CM | POA: Diagnosis present

## 2023-01-29 DIAGNOSIS — Z515 Encounter for palliative care: Secondary | ICD-10-CM

## 2023-01-29 DIAGNOSIS — Z820 Family history of epilepsy and other diseases of the nervous system: Secondary | ICD-10-CM

## 2023-01-29 DIAGNOSIS — K56600 Partial intestinal obstruction, unspecified as to cause: Secondary | ICD-10-CM | POA: Diagnosis present

## 2023-01-29 DIAGNOSIS — Z66 Do not resuscitate: Secondary | ICD-10-CM | POA: Diagnosis present

## 2023-01-29 DIAGNOSIS — Z860100 Personal history of colon polyps, unspecified: Secondary | ICD-10-CM

## 2023-01-29 DIAGNOSIS — Z711 Person with feared health complaint in whom no diagnosis is made: Secondary | ICD-10-CM | POA: Diagnosis not present

## 2023-01-29 DIAGNOSIS — N1832 Chronic kidney disease, stage 3b: Secondary | ICD-10-CM | POA: Diagnosis present

## 2023-01-29 DIAGNOSIS — X58XXXA Exposure to other specified factors, initial encounter: Secondary | ICD-10-CM | POA: Diagnosis present

## 2023-01-29 DIAGNOSIS — E785 Hyperlipidemia, unspecified: Secondary | ICD-10-CM | POA: Diagnosis present

## 2023-01-29 DIAGNOSIS — K9189 Other postprocedural complications and disorders of digestive system: Secondary | ICD-10-CM | POA: Diagnosis not present

## 2023-01-29 DIAGNOSIS — Z86718 Personal history of other venous thrombosis and embolism: Secondary | ICD-10-CM | POA: Diagnosis not present

## 2023-01-29 DIAGNOSIS — N400 Enlarged prostate without lower urinary tract symptoms: Secondary | ICD-10-CM | POA: Diagnosis present

## 2023-01-29 DIAGNOSIS — I129 Hypertensive chronic kidney disease with stage 1 through stage 4 chronic kidney disease, or unspecified chronic kidney disease: Secondary | ICD-10-CM | POA: Diagnosis present

## 2023-01-29 DIAGNOSIS — T82898A Other specified complication of vascular prosthetic devices, implants and grafts, initial encounter: Principal | ICD-10-CM | POA: Diagnosis present

## 2023-01-29 DIAGNOSIS — Z888 Allergy status to other drugs, medicaments and biological substances status: Secondary | ICD-10-CM

## 2023-01-29 DIAGNOSIS — N179 Acute kidney failure, unspecified: Secondary | ICD-10-CM | POA: Diagnosis present

## 2023-01-29 DIAGNOSIS — K559 Vascular disorder of intestine, unspecified: Secondary | ICD-10-CM

## 2023-01-29 DIAGNOSIS — Z79899 Other long term (current) drug therapy: Secondary | ICD-10-CM

## 2023-01-29 DIAGNOSIS — R5381 Other malaise: Secondary | ICD-10-CM | POA: Diagnosis present

## 2023-01-29 DIAGNOSIS — I82611 Acute embolism and thrombosis of superficial veins of right upper extremity: Secondary | ICD-10-CM | POA: Diagnosis present

## 2023-01-29 DIAGNOSIS — Z789 Other specified health status: Secondary | ICD-10-CM | POA: Diagnosis not present

## 2023-01-29 DIAGNOSIS — K56609 Unspecified intestinal obstruction, unspecified as to partial versus complete obstruction: Principal | ICD-10-CM | POA: Diagnosis present

## 2023-01-29 DIAGNOSIS — L309 Dermatitis, unspecified: Secondary | ICD-10-CM | POA: Diagnosis present

## 2023-01-29 DIAGNOSIS — E66811 Obesity, class 1: Secondary | ICD-10-CM | POA: Diagnosis present

## 2023-01-29 DIAGNOSIS — Z9842 Cataract extraction status, left eye: Secondary | ICD-10-CM

## 2023-01-29 DIAGNOSIS — E43 Unspecified severe protein-calorie malnutrition: Secondary | ICD-10-CM | POA: Diagnosis present

## 2023-01-29 DIAGNOSIS — L89310 Pressure ulcer of right buttock, unstageable: Secondary | ICD-10-CM | POA: Diagnosis present

## 2023-01-29 DIAGNOSIS — Z8547 Personal history of malignant neoplasm of testis: Secondary | ICD-10-CM

## 2023-01-29 DIAGNOSIS — I10 Essential (primary) hypertension: Secondary | ICD-10-CM | POA: Diagnosis present

## 2023-01-29 DIAGNOSIS — D72829 Elevated white blood cell count, unspecified: Secondary | ICD-10-CM | POA: Diagnosis present

## 2023-01-29 DIAGNOSIS — Z8249 Family history of ischemic heart disease and other diseases of the circulatory system: Secondary | ICD-10-CM

## 2023-01-29 DIAGNOSIS — K551 Chronic vascular disorders of intestine: Secondary | ICD-10-CM | POA: Diagnosis present

## 2023-01-29 DIAGNOSIS — E876 Hypokalemia: Secondary | ICD-10-CM | POA: Diagnosis present

## 2023-01-29 DIAGNOSIS — K219 Gastro-esophageal reflux disease without esophagitis: Secondary | ICD-10-CM | POA: Diagnosis present

## 2023-01-29 DIAGNOSIS — M7989 Other specified soft tissue disorders: Secondary | ICD-10-CM | POA: Diagnosis present

## 2023-01-29 DIAGNOSIS — Z9841 Cataract extraction status, right eye: Secondary | ICD-10-CM

## 2023-01-29 DIAGNOSIS — Z6832 Body mass index (BMI) 32.0-32.9, adult: Secondary | ICD-10-CM

## 2023-01-29 DIAGNOSIS — Z95828 Presence of other vascular implants and grafts: Secondary | ICD-10-CM | POA: Diagnosis not present

## 2023-01-29 DIAGNOSIS — D631 Anemia in chronic kidney disease: Secondary | ICD-10-CM | POA: Diagnosis present

## 2023-01-29 DIAGNOSIS — R109 Unspecified abdominal pain: Secondary | ICD-10-CM | POA: Diagnosis not present

## 2023-01-29 DIAGNOSIS — K567 Ileus, unspecified: Secondary | ICD-10-CM

## 2023-01-29 DIAGNOSIS — N183 Chronic kidney disease, stage 3 unspecified: Secondary | ICD-10-CM | POA: Diagnosis present

## 2023-01-29 DIAGNOSIS — Z7189 Other specified counseling: Secondary | ICD-10-CM | POA: Diagnosis not present

## 2023-01-29 LAB — CBC
HCT: 32.6 % — ABNORMAL LOW (ref 39.0–52.0)
Hemoglobin: 10.8 g/dL — ABNORMAL LOW (ref 13.0–17.0)
MCH: 29.7 pg (ref 26.0–34.0)
MCHC: 33.1 g/dL (ref 30.0–36.0)
MCV: 89.6 fL (ref 80.0–100.0)
Platelets: 158 10*3/uL (ref 150–400)
RBC: 3.64 MIL/uL — ABNORMAL LOW (ref 4.22–5.81)
RDW: 14.4 % (ref 11.5–15.5)
WBC: 12.1 10*3/uL — ABNORMAL HIGH (ref 4.0–10.5)
nRBC: 0 % (ref 0.0–0.2)

## 2023-01-29 LAB — RENAL FUNCTION PANEL
Albumin: 2.4 g/dL — ABNORMAL LOW (ref 3.5–5.0)
Anion gap: 13 (ref 5–15)
BUN: 29 mg/dL — ABNORMAL HIGH (ref 8–23)
CO2: 22 mmol/L (ref 22–32)
Calcium: 8.9 mg/dL (ref 8.9–10.3)
Chloride: 102 mmol/L (ref 98–111)
Creatinine, Ser: 2.05 mg/dL — ABNORMAL HIGH (ref 0.61–1.24)
GFR, Estimated: 35 mL/min — ABNORMAL LOW (ref >60)
Glucose, Bld: 131 mg/dL — ABNORMAL HIGH (ref 70–99)
Phosphorus: 3.3 mg/dL (ref 2.5–4.6)
Potassium: 3.9 mmol/L (ref 3.5–5.1)
Sodium: 137 mmol/L (ref 135–145)

## 2023-01-29 LAB — GLUCOSE, CAPILLARY: Glucose-Capillary: 124 mg/dL — ABNORMAL HIGH (ref 70–99)

## 2023-01-29 LAB — MAGNESIUM: Magnesium: 1.4 mg/dL — ABNORMAL LOW (ref 1.7–2.4)

## 2023-01-29 MED ORDER — ONDANSETRON HCL 4 MG/2ML IJ SOLN
4.0000 mg | Freq: Four times a day (QID) | INTRAMUSCULAR | Status: DC | PRN
  Administered 2023-01-30 – 2023-02-06 (×4): 4 mg via INTRAVENOUS
  Filled 2023-01-29 (×4): qty 2

## 2023-01-29 MED ORDER — CHLORHEXIDINE GLUCONATE CLOTH 2 % EX PADS
6.0000 | MEDICATED_PAD | Freq: Every day | CUTANEOUS | Status: DC
Start: 2023-01-30 — End: 2023-02-07
  Administered 2023-01-30 – 2023-02-07 (×8): 6 via TOPICAL

## 2023-01-29 MED ORDER — MAGNESIUM SULFATE 4 GM/100ML IV SOLN
4.0000 g | Freq: Once | INTRAVENOUS | Status: AC
  Administered 2023-01-29: 4 g via INTRAVENOUS
  Filled 2023-01-29: qty 100

## 2023-01-29 MED ORDER — ONDANSETRON HCL 4 MG PO TABS: 4.0000 mg | ORAL_TABLET | Freq: Four times a day (QID) | ORAL | Status: DC | PRN

## 2023-01-29 MED ORDER — DEXTROSE-SODIUM CHLORIDE 5-0.45 % IV SOLN: INTRAVENOUS | Status: DC

## 2023-01-29 MED ORDER — ACETAMINOPHEN 650 MG RE SUPP: 650.0000 mg | Freq: Four times a day (QID) | RECTAL | Status: DC | PRN

## 2023-01-29 MED ORDER — ACETAMINOPHEN 10 MG/ML IV SOLN
1000.0000 mg | Freq: Four times a day (QID) | INTRAVENOUS | Status: DC
  Filled 2023-01-29 (×2): qty 100

## 2023-01-29 MED ORDER — HYDRALAZINE HCL 20 MG/ML IJ SOLN
10.0000 mg | INTRAMUSCULAR | Status: DC | PRN
  Administered 2023-01-29 – 2023-01-30 (×2): 10 mg via INTRAVENOUS
  Filled 2023-01-29 (×2): qty 1

## 2023-01-29 MED ORDER — ACETAMINOPHEN 325 MG PO TABS
650.0000 mg | ORAL_TABLET | Freq: Four times a day (QID) | ORAL | Status: DC | PRN
  Filled 2023-01-29: qty 2

## 2023-01-29 MED ORDER — HYDROMORPHONE HCL 1 MG/ML IJ SOLN: 1.0000 mg | INTRAMUSCULAR | Status: DC | PRN

## 2023-01-29 MED ORDER — ENOXAPARIN SODIUM 30 MG/0.3ML IJ SOSY: 30.0000 mg | PREFILLED_SYRINGE | INTRAMUSCULAR | Status: DC

## 2023-01-29 MED ORDER — AMLODIPINE BESYLATE 5 MG PO TABS
5.0000 mg | ORAL_TABLET | Freq: Once | ORAL | Status: AC
  Administered 2023-01-29: 5 mg via ORAL
  Filled 2023-01-29: qty 1

## 2023-01-29 MED ORDER — HYDROMORPHONE HCL 1 MG/ML IJ SOLN
1.0000 mg | Freq: Once | INTRAMUSCULAR | Status: AC
  Administered 2023-01-29: 1 mg via INTRAVENOUS
  Filled 2023-01-29: qty 1

## 2023-01-29 MED ORDER — AMLODIPINE BESYLATE 10 MG PO TABS: 10.0000 mg | ORAL_TABLET | Freq: Every day | ORAL | Status: DC

## 2023-01-29 MED ORDER — HYDROMORPHONE HCL 1 MG/ML IJ SOLN
0.5000 mg | INTRAMUSCULAR | Status: DC | PRN
  Administered 2023-01-29 – 2023-01-30 (×3): 0.5 mg via INTRAVENOUS
  Filled 2023-01-29 (×3): qty 0.5

## 2023-01-29 NOTE — Progress Notes (Signed)
Inpatient Rehabilitation Care Coordinator Discharge Note   Patient Details  Name: [name] [name] MRN: [id number] Date of Birth: 1955-12-26   Discharge location: Transferring back to acute due to medical issues  Length of Stay: 5 Days  Discharge activity level: min-mod level  Home/community participation: active  Patient response JX:BJYNWG Literacy - How often do you need to have someone help you when you read instructions, pamphlets, or other written material from your doctor or pharmacy?: Never  Patient response NF:AOZHYQ Isolation - How often do you feel lonely or isolated from those around you?: Never  Services provided included: MD, RD, PT, OT, RN, CM, Pharmacy, SW  Financial Services:  Financial Services Utilized: Medicare    Choices offered to/list presented to: na  Follow-up services arranged:  Other (Comment) (transferring to acute medical issues)           Patient response to transportation need: Is the patient able to respond to transportation needs?: Yes In the past 12 months, has lack of transportation kept you from medical appointments or from getting medications?: No In the past 12 months, has lack of transportation kept you from meetings, work, or from getting things needed for daily living?: No   Patient/Family verbalized understanding of follow-up arrangements:  Yes  Individual responsible for coordination of the follow-up plan: self and anita-wife  Confirmed correct DME delivered: Lucy Chris 01/29/2023    Comments (or additional information):Not completed rehab will need to return once medically stable    Dameian Crisman, Lemar Livings

## 2023-01-29 NOTE — Progress Notes (Signed)
Physical Therapy Note  Patient Details  Name: [name] [name] MRN: [id number] Date of Birth: 1956/04/09 Today's Date: 01/29/2023    Physical Therapy Discharge Note  This patient was unable to complete the inpatient rehab program due to return to acute care; therefore did not meet their long term goals. Pt left the program at a CGA/min A assist level for their functional mobility/ transfers. This patient is being discharged from PT services at this time.  Pt's perception of pain in the last five days was unable to answer at this time.    See CareTool for functional status details  If the patient is able to return to inpatient rehabilitation within 3 midnights, this may be considered an interrupted stay and therapy services will resume as ordered. Modification and reinstatement of their goals will be made upon completion of therapy service reevaluations.     [facility], [place], DPT  01/29/2023, 3:57 PM

## 2023-01-29 NOTE — Progress Notes (Signed)
3 Days Post-Op   Subjective/Chief Complaint: Complains ab pain, ct overnight reviewed, he was having fair amount loose stool until had immodium a couple days ago, now with ab pain   Objective: Vital signs in last 24 hours: Temp:  [97.8 F (36.6 C)-98.6 F (37 C)] 98.2 F (36.8 C) (10/21 0743) Pulse Rate:  [86-100] 100 (10/21 0743) Resp:  [18-19] 18 (10/21 0743) BP: (163-194)/(72-90) 190/82 (10/21 0934) SpO2:  [97 %-100 %] 97 % (10/21 0743) Weight:  [104.6 kg] 104.6 kg (10/21 0500) Last BM Date : 01/28/23  Intake/Output from previous day: 10/20 0701 - 10/21 0700 In: 1282.8 [P.O.:240; I.V.:642.8; IV Piggyback:400] Out: 2050 [Urine:2050] Intake/Output this shift: Total I/O In: -  Out: 1 [Emesis/NG output:1]  Ab dressing in place, soft really not tender  Lab Results:  Recent Labs    01/28/23 1951 01/29/23 0548  WBC 7.1 12.1*  HGB 9.8* 10.8*  HCT 29.6* 32.6*  PLT 148* 158   BMET Recent Labs    01/28/23 1951 01/29/23 0548  NA 134* 137  K 3.1* 3.9  CL 101 102  CO2 23 22  GLUCOSE 174* 131*  BUN 33* 29*  CREATININE 2.28* 2.05*  CALCIUM 7.8* 8.9   PT/INR No results for input(s): "LABPROT", "INR" in the last 72 hours. ABG No results for input(s): "PHART", "HCO3" in the last 72 hours.  Invalid input(s): "PCO2", "PO2"  Studies/Results: CT ABDOMEN PELVIS WO CONTRAST  Result Date: 01/29/2023 CLINICAL DATA:  Abdominal pain, acute, nonlocalized SMA thrombectomy and angioplasty with a stent to the distal SMA w/ vascular and ex-lap, EXAM: CT ABDOMEN AND PELVIS WITHOUT CONTRAST TECHNIQUE: Multidetector CT imaging of the abdomen and pelvis was performed following the standard protocol without IV contrast. RADIATION DOSE REDUCTION: This exam was performed according to the departmental dose-optimization program which includes automated exposure control, adjustment of the mA and/or kV according to patient size and/or use of iterative reconstruction technique. COMPARISON:  CT  abdomen pelvis 12/21/2022 chole CTA GI bleed 12/29/2022 FINDINGS: Lower chest: Bilateral trace pleural effusions. Hepatobiliary: No focal liver abnormality. No gallstones, gallbladder wall thickening, or pericholecystic fluid. No biliary dilatation. Pancreas: No focal lesion. Normal pancreatic contour. No surrounding inflammatory changes. No main pancreatic ductal dilatation. Spleen: Normal in size without focal abnormality. Adrenals/Urinary Tract: No adrenal nodule bilaterally. No nephrolithiasis and no hydronephrosis. No definite contour-deforming renal mass. No ureterolithiasis or hydroureter. The urinary bladder is unremarkable. Stomach/Bowel: Small bowel resection surgical changes noted. Stomach is within normal limits. Similar-appearing dilatation of several loops of the small bowel within the left mid abdomen in the setting of small bowel resection surgical changes. These loops of small bowel no longer demonstrate bowel wall thickening but instead fecalized material within the lumen. Colonic diverticulosis. Under distended rectum with persistent trace perirectal fat stranding. Appendix appears normal. Vascular/Lymphatic: No portal venous or mesenteric venous gas. Inferior vena cava filter in grossly appropriate position. SMA stent. No abdominal aorta or iliac aneurysm. Severe atherosclerotic plaque of the aorta and its branches. No abdominal, pelvic, or inguinal lymphadenopathy. Reproductive: Prostate is unremarkable. Other: No intraperitoneal free fluid. No intraperitoneal free gas. No organized fluid collection. Musculoskeletal: No abdominal wall hernia or abnormality. No suspicious lytic or blastic osseous lesions. No acute displaced fracture. IMPRESSION: 1. Limited evaluation on this noncontrast study. 2. Similar-appearing dilatation of several loops of the small bowel within the left mid abdomen in the setting of small bowel resection surgical changes. These loops of small bowel no longer demonstrate  bowel wall thickening but  instead fecalized material within the lumen. Findings suggestive of a slow transition state with possible early or partial small bowel obstruction. No pneumatosis. No bowel wall perforation. Consider small-bowel follow-through radiographs series. 3. Under-distended rectum with persistent trace perirectal fat stranding. Recommend attention on follow-up. 4. Colonic diverticulosis with no acute diverticulitis. 5. Inferior vena cava filter in grossly appropriate position. 6.  Aortic Atherosclerosis (ICD10-I70.0)- severe. 7. Bilateral trace pleural effusions. Electronically Signed   By: Tish Frederickson M.D.   On: 01/29/2023 01:12   DG Abd 2 Views  Result Date: 01/28/2023 CLINICAL DATA:  Diarrhea EXAM: ABDOMEN - 2 VIEW COMPARISON:  12/28/2022 FINDINGS: The bowel gas pattern is normal. There is no evidence of free air. IVC filter in place. No radio-opaque calculi or other significant radiographic abnormality is seen. Vascular calcifications. IMPRESSION: Negative. Electronically Signed   By: Duanne Guess D.O.   On: 01/28/2023 18:56    Anti-infectives: Anti-infectives (From admission, onward)    Start     Dose/Rate Route Frequency Ordered Stop   01/27/23 1345  doxycycline (VIBRA-TABS) tablet 100 mg        100 mg Oral Every 12 hours 01/27/23 1255         Assessment/Plan: 67 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 Karin Lieu, exploratory laparotomy and small bowel resection Dr. Janee Morn -S/p re-exploration, replace abthera FB 12/23/22   -S/p re-exploration, small bowel resection with anastomosis x2, assessment of bowel perfusion with ICG, abdominal closure 9/16 Dr. Andrey Campanile - CT overnight, noncon looks more postop in nature, lactic acid normal -not sure of why he is having pain- he does not appear toxic at all- I supposed may be related to some degree of ileus although certainly ischemia is possible given history -no  indication for surgery from my standpoint and he would really have to have an option to revascularize his small bowel if there is an issue with blood flow which does not sound like there is -if any continued n/v needs ng tube placed, would make npo -would recommend switching meds to IV and not PO -  wet to dry dressing changes to midline abdominal wound -transferring back to acute side might be reasonable   FEN: NPO    [name] [name] 01/29/2023

## 2023-01-29 NOTE — Progress Notes (Signed)
Occupational Therapy Session Note  Patient Details  Name: [name] [name] MRN: [id number] Date of Birth: 12-Sep-1955  Today's Date: 01/29/2023 OT Missed Time: 60 Minutes Missed Time Reason: Pain;Patient fatigue  Today's Date: 01/29/2023 OT Missed Time: 76 Minutes Missed Time Reason: Patient fatigue  Short Term Goals: Week 1:  OT Short Term Goal 1 (Week 1): Pt will stand up to 3 min sinkside for grooming with S OT Short Term Goal 2 (Week 1): Pt will amb with LRAD to perform toileting with CGA overall OT Short Term Goal 3 (Week 1): Pt will complete LB self care with close S with AE  Skilled Therapeutic Interventions/Progress Updates:   Session 1: Pt missing ~60 minutes of skilled intervention, declining participation due to increased abdominal pain and fatigue.   Session 2: Pt missing ~75 minutes of skilled intervention, declining participation due to increased fatigue.   Therapy Documentation Precautions:  Precautions Precautions: Fall, Other (comment) Precaution Comments: abdominal incision Restrictions Weight Bearing Restrictions: No   Therapy/Group: Individual Therapy  Lou Cal, OTR/L, MSOT  01/29/2023, 5:50 AM

## 2023-01-29 NOTE — Progress Notes (Signed)
[facility] Progress Note   Assessment/ Plan:   AKI on CKD IIIb: Baseline creatinine level around 2.0.  Acute kidney injury likely ischemic ATN due to ex lap surgery/IV contrast/hypotension  Started on HD 9/18 due to inability to wean from vent and worsening renal function.  He was then switched to CRRT on 9/20-24. Course complicated by GI bleed.  Started back on HD on 10/5   tunneled catheter- placed 10/8, appears that last HD was 10/18 Agree with not intervening on right RAS given that his right kidney is already much smaller than anticipated, discussed with VVS at that time. Likely not much function being contributed from right.    Creatinine is now improved and not needing dialysis but did get contrast with ivc filter placement on 10/18 and has been diuresed -  trending in right direction w/ great UOP; has had issue with urinary retention Had been getting torsemide 40 mg daily for edema-   held diuretic for a bit as we follow kidney trend (improving) and we will sign off at this time. If creatinine continues to improve or remains stable with good UOP then may remove tunneled dialysis catheter later this week or early next week Acute mesenteric ischemia: Status post small bowel resection and superior mesenteric artery stent placement.  Followed by vascular surgery.  S/p OR again 9/16 for exlap and further small bowel resection, abd closed Hypertension: Better; started amlodipine 5 mg daily -> increase to 10mg  daily.  Also on hydralazine 50 mg 3 times daily.  Continue to monitor. Hypokalemia: On oral replacement daily.  Continue to monitor.  Replace magnesium as indicated Anemia due to acute blood loss: transfuse prn for hgb <7.  PRBC's per primary team. Multiple transfusions on 10/1.  S/p EGD and colonoscopy on 10/2.  Transfusions intermittently Acute GI bleed - s/p PRBC's and GI is following.  S/p EGD and colonoscopy as above on 10/2 with severe mucosal ulceration in the rectum and  rectosigmoid colon (felt ischemic or infectious per GI) Severe protein malnutrition - nutrition per primary team; improving  Subjective:    Still with lots of UOP-  torsemide held and continues to have great UOP; currently having urinary retention. BUN and crt continues to improve. Wife bedside updated; continues to have abdominal pain.   Objective:   BP (!) 194/86 (BP Location: Left Arm)   Pulse 100   Temp 98.2 F (36.8 C) (Oral)   Resp 18   Wt 104.6 kg   SpO2 97%   BMI 32.17 kg/m   Physical Exam: Gen:NAD, lying flat in bed CVS: normal rate Resp: Bilateral chest rise with no increased work of breathing Abd: soft, nondistended Ext: trace lower extremity edema ACCESS: tunneled internal jugular cathter R side   Labs: BMET Recent Labs  Lab 01/23/23 0615 01/24/23 0520 01/25/23 0459 01/27/23 0735 01/28/23 0621 01/28/23 1951 01/29/23 0548  NA 137 138 139 139 136 134* 137  K 3.1* 3.5 3.3* 3.6 3.3* 3.1* 3.9  CL 101 104 103 105 101 101 102  CO2 25 23 24 23 24 23 22   GLUCOSE 91 94 111* 119* 118* 174* 131*  BUN 35* 32* 32* 42* 34* 33* 29*  CREATININE 2.09* 2.17* 2.37* 2.43* 2.31* 2.28* 2.05*  CALCIUM 7.6* 8.1* 8.4* 7.8* 7.8* 7.8* 8.9  PHOS  --   --   --   --  3.8  --  3.3   CBC Recent Labs  Lab 01/25/23 0459 01/27/23 0735 01/28/23 1951 01/29/23 0548  WBC 6.1 6.0 7.1 12.1*  NEUTROABS 4.7  --  6.1  --   HGB 9.5* 10.5* 9.8* 10.8*  HCT 28.6* 31.7* 29.6* 32.6*  MCV 88.0 89.0 87.8 89.6  PLT 163 164 148* 158      Medications:     amiodarone  200 mg Oral Daily   amLODipine  5 mg Oral Daily   ascorbic acid  500 mg Oral Daily   Chlorhexidine Gluconate Cloth  6 each Topical Q12H   diphenoxylate-atropine  1 tablet Oral TID AC   doxycycline  100 mg Oral Q12H   Gerhardt's butt cream   Topical BID   hydrALAZINE  50 mg Oral TID   hydrocortisone   Rectal TID    HYDROmorphone (DILAUDID) injection  1 mg Intravenous Once   leptospermum manuka honey  1 Application Topical  Daily   multivitamin  1 tablet Oral QHS   pantoprazole  40 mg Oral BID   polycarbophil  625 mg Oral BID   potassium chloride  40 mEq Oral Daily   potassium chloride  40 mEq Oral Once   protein supplement  1 Scoop Oral TID WC   tamsulosin  0.8 mg Oral QPC supper

## 2023-01-29 NOTE — Progress Notes (Signed)
Physical Therapy Session Note  Patient Details  Name: VANDEN HAYTER MRN: [id number] Date of Birth: 04/16/1955  Today's Date: 01/29/2023 PT Missed Time: 60 Minutes Missed Time Reason: Patient unwilling to participate;Patient ill (Comment) (pt reports vomitting and not felling well)  Short Term Goals: Week 1:  PT Short Term Goal 1 (Week 1): Pt will tolerate sitting OOB for 3 hours each day PT Short Term Goal 2 (Week 1): Pt will ambulate 50 feet or more with LRAD and min A PT Short Term Goal 3 (Week 1): Pt will perform stand pivot transfer with LRAD and CGA  Skilled Therapeutic Interventions/Progress Updates:      Pt in L sidelying with wife in room upon arrival. Pt reports inability to participate in therapy today 2/2 nausea/vomitting, and not feeling well.   Therapist will attempt to make up missed time as able.     Therapy Documentation Precautions:  Precautions Precautions: Fall, Other (comment) Precaution Comments: abdominal incision Restrictions Weight Bearing Restrictions: No  Therapy/Group: Individual Therapy  [place] Ambrose Finland, [place], DPT  01/29/2023, 7:40 AM

## 2023-01-29 NOTE — Plan of Care (Signed)
  Problem: Education: Goal: Knowledge of General Education information will improve Description: Including pain rating scale, medication(s)/side effects and non-pharmacologic comfort measures Outcome: Progressing   Problem: Clinical Measurements: Goal: Will remain free from infection Outcome: Progressing Goal: Diagnostic test results will improve Outcome: Progressing   Problem: Activity: Goal: Risk for activity intolerance will decrease Outcome: Progressing   Problem: Coping: Goal: Level of anxiety will decrease Outcome: Progressing   Problem: Pain Managment: Goal: General experience of comfort will improve Outcome: Progressing   Problem: Activity: Goal: Ability to tolerate increased activity will improve Outcome: Progressing

## 2023-01-29 NOTE — H&P (Addendum)
History and Physical    [name] [name]:[id number] DOB: 11-28-1955 DOA: 01/29/2023  I have briefly reviewed the patient's prior medical records in [facility]  PCP: [name], MD  Patient coming from: CIR  Chief Complaint: Abdominal pain  HPI: [name] [name] is a 67 y.o. male with medical history significant of HTN, HLD, who was in inpatient rehab who comes into the hospital with abdominal pain.  He recently had a prolonged and complicated hospitalization between 12/20/2022-01/24/2023.  For full hospitalization course please refer to the DC summary on 01/24/2023 by Dr. Lowell Guitar.  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  Vascular surgery was consulted and he was found to have an acute occlusion of the distal SMA as well as the inferior mesenteric artery.  General surgery was also consulted, and he was taken to the OR emergently, underwent an SMA stent and had resected the portion of his jejunum due to necrosis.  Hospital course complicated by multiple OR trips to check on bowel integrity with additional resections and reanastomoses x 2.  He also developed renal failure and had to be placed on dialysis.  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  Upon stability he was discharged to inpatient rehab, he was doing well up until yesterday when he felt sharp abdominal pain, along with nausea and vomiting.  He was found to have concerns for small bowel obstruction, was seen by general surgery this morning and felt like he would benefit from being moved back to the acute care side.  Of note, while in rehab, he developed a DVT and  underwent IVC filter placement.   Patient currently denies any abdominal pain, denies any nausea.  He denies any fever or chills.  Last bowel movement was yesterday morning and this is unusual for him as following his hospitalization he has been having diarrhea.  No chest pain, no shortness of breath.  No numbness or tingling.  Review of Systems: All systems reviewed, and apart from HPI, all negative  Past Medical History:  Diagnosis Date   Ankle fracture    Cancer (HCC) 1974   testicular cancer-at age 72   Cataract    bilateral sx   GERD (gastroesophageal reflux disease)    hx of   Hx of colonic polyps    pt unsure,thinks this was done in 1982 in [place]. no reports in EPIC   Hyperlipidemia    on meds   Hypertension    on meds   Kidney stones    Patella fracture    Retinal detachment    OD   Retinal tear of right eye    Syncopal episodes 2008    Past Surgical History:  Procedure Laterality Date   AIR/FLUID EXCHANGE Right 06/16/2014   Procedure: AIR/FLUID EXCHANGE;  Surgeon: Sherrie George, MD;  Location: [facility] OR;  Service: Ophthalmology;  Laterality: Right;   ANGIOPLASTY Left 12/21/2022   Procedure: BALLOON ANGIOPLASTY SUPERIOR MESENTERIC ARTERY;  Surgeon: Victorino Sparrow, MD;  Location: [facility] OR;  Service: Vascular;  Laterality: Left;   APPLICATION OF WOUND VAC N/A 12/21/2022   Procedure: APPLICATION OF ABTHERA WOUND VAC;  Surgeon: Violeta Gelinas, MD;  Location:  [facility] OR;  Service: General;  Laterality: N/A;   BIOPSY  04/04/2021   Procedure: BIOPSY;  Surgeon: Meridee Score Netty Starring., MD;  Location: [facility] ENDOSCOPY;  Service: Gastroenterology;;   BIOPSY  01/10/2023   Procedure: BIOPSY;  Surgeon: Beverley Fiedler, MD;  Location: [facility] ENDOSCOPY;  Service: Gastroenterology;;   BOWEL RESECTION  12/21/2022   Procedure: SMALL BOWEL RESECTION;  Surgeon: Violeta Gelinas, MD;  Location: [facility] OR;  Service: General;;   BOWEL RESECTION N/A 12/25/2022   Procedure: SMALL BOWEL RESECTION W/ANASTOMOSIS X2;   Surgeon: Gaynelle Adu, MD;  Location: [facility] OR;  Service: General;  Laterality: N/A;   CATARACT EXTRACTION     CATARACT EXTRACTION  09/2014   rt eye//left 01/2015   CATARACT EXTRACTION, BILATERAL  2016   per Dr. Elmer Picker    COLONOSCOPY  02/16/2020   per Dr. Rhea Belton, adenomatous polyps, repeat in 3 yrs   COLONOSCOPY N/A 01/10/2023   Procedure: COLONOSCOPY;  Surgeon: Beverley Fiedler, MD;  Location: [facility] ENDOSCOPY;  Service: Gastroenterology;  Laterality: N/A;   ENTEROSCOPY N/A 04/04/2021   Procedure: ENTEROSCOPY;  Surgeon: Meridee Score Netty Starring., MD;  Location: [facility] ENDOSCOPY;  Service: Gastroenterology;  Laterality: N/A;   ESOPHAGOGASTRODUODENOSCOPY (EGD) WITH PROPOFOL N/A 01/10/2023   Procedure: ESOPHAGOGASTRODUODENOSCOPY (EGD) WITH PROPOFOL;  Surgeon: Beverley Fiedler, MD;  Location: [facility] ENDOSCOPY;  Service: Gastroenterology;  Laterality: N/A;   EYE SURGERY     GAS INSERTION Right 06/16/2014   Procedure: INSERTION OF GAS;  Surgeon: Sherrie George, MD;  Location: [facility] OR;  Service: Ophthalmology;  Laterality: Right;  [id number]   GIVENS CAPSULE STUDY N/A 04/02/2021   Procedure: GIVENS CAPSULE STUDY;  Surgeon: Lemar Lofty., MD;  Location: [facility] ENDOSCOPY;  Service: Gastroenterology;  Laterality: N/A;   HYDROCELE EXCISION     INSERTION OF ILIAC STENT N/A 12/21/2022   Procedure: INSERTION OF SUPERIOR MESENTERIC ARTERY STENT;  Surgeon: Victorino Sparrow, MD;  Location: [facility] OR;  Service: Vascular;  Laterality: N/A;   IR ANGIOGRAM VISCERAL SELECTIVE  04/04/2021   IR FLUORO GUIDE CV LINE RIGHT  01/12/2023   IR FLUORO GUIDE CV LINE RIGHT  01/16/2023   IR US GUIDE VASC ACCESS LEFT  04/04/2021   IR US GUIDE VASC ACCESS RIGHT  01/12/2023   IVC FILTER INSERTION N/A 01/26/2023   Procedure: IVC FILTER INSERTION;  Surgeon: Leonie Douglas, MD;  Location: [facility] INVASIVE CV LAB;  Service: Cardiovascular;  Laterality: N/A;   KNEE ARTHROSCOPY Right    x 2   LAPAROTOMY N/A 12/21/2022   Procedure: EXPLORATION LAPAROTOMY;   Surgeon: Violeta Gelinas, MD;  Location: [facility] OR;  Service: General;  Laterality: N/A;   LAPAROTOMY N/A 12/23/2022   Procedure: RE-EXPLORATION LAPAROTOMY ABTHERA VAC EXCHANGE;  Surgeon: Almond Lint, MD;  Location: [facility] OR;  Service: General;  Laterality: N/A;   LAPAROTOMY N/A 12/25/2022   Procedure: RE-EXPLORATION LAPAROTOMY W/CLOSURE;  Surgeon: Gaynelle Adu, MD;  Location: [facility] OR;  Service: General;  Laterality: N/A;   LASER PHOTO ABLATION Right 06/16/2014   Procedure: LASER PHOTO ABLATION;  Surgeon: Sherrie George, MD;  Location: [facility] OR;  Service: Ophthalmology;  Laterality: Right;  Headscope laser and endolaser    ORCHIECTOMY     right testicle   REPAIR OF COMPLEX TRACTION RETINAL DETACHMENT Right 06/16/2014   Procedure: REPAIR OF COMPLEX TRACTION RETINAL DETACHMENT;  Surgeon: Sherrie George, MD;  Location: [facility] OR;  Service: Ophthalmology;  Laterality: Right;   RETINAL DETACHMENT SURGERY  06/2014   SUBMUCOSAL TATTOO INJECTION  04/04/2021  Procedure: SUBMUCOSAL TATTOO INJECTION;  Surgeon: Lemar Lofty., MD;  Location: [facility] ENDOSCOPY;  Service: Gastroenterology;;   TONSILLECTOMY     ULTRASOUND GUIDANCE FOR VASCULAR ACCESS Left 12/21/2022   Procedure: ULTRASOUND GUIDANCE FOR VASCULAR ACCESS FEMORAL ARTER;  Surgeon: Victorino Sparrow, MD;  Location: [facility] OR;  Service: Vascular;  Laterality: Left;   VISCERAL ANGIOGRAM Left 12/21/2022   Procedure: SUPERIOR MESENTERIC ARTERY ANGIOGRAM;  Surgeon: Victorino Sparrow, MD;  Location: [facility] OR;  Service: Vascular;  Laterality: Left;   VITRECTOMY Right 06/16/2014   WISDOM TOOTH EXTRACTION       reports that he has never smoked. He has never used smokeless tobacco. He reports that he does not currently use alcohol. He reports that he does not use drugs.  Allergies  Allergen Reactions   Zestril [Lisinopril] Cough    Family History  Problem Relation Age of Onset   Parkinson's disease Mother    Arrhythmia Father    Coronary artery disease Other         fhx   Hypertension Other        fhx   Sudden death Other        fhx   Colon cancer Neg Hx    Colon polyps Neg Hx    Esophageal cancer Neg Hx    Rectal cancer Neg Hx    Stomach cancer Neg Hx     Prior to Admission medications   Medication Sig Start Date End Date Taking? Authorizing Provider  amiodarone (PACERONE) 200 MG tablet Take 1 tablet (200 mg total) by mouth daily. 01/24/23   Zigmund Daniel., MD  amLODipine (NORVASC) 5 MG tablet Take 1 tablet (5 mg total) by mouth daily. 01/24/23   Zigmund Daniel., MD  artificial tears (LACRILUBE) OINT ophthalmic ointment Place into the right eye every 4 (four) hours as needed for dry eyes. 01/25/23   Love, Evlyn Kanner, PA-C  hydrALAZINE (APRESOLINE) 50 MG tablet Take 1 tablet (50 mg total) by mouth 3 (three) times daily. 11/21/22   [name], MD  pantoprazole (PROTONIX) 40 MG tablet Take 1 tablet (40 mg total) by mouth 2 (two) times daily. 01/24/23 01/24/24  Zigmund Daniel., MD  tamsulosin (FLOMAX) 0.4 MG CAPS capsule Take 2 capsules (0.8 mg total) by mouth daily after supper. 01/24/23 02/23/23  Zigmund Daniel., MD  torsemide 40 MG TABS Take 40 mg by mouth daily. 01/24/23   Zigmund Daniel., MD    Physical Exam: Vitals:   01/29/23 1601  BP: (!) 186/88  Pulse: (!) 105  Resp: 16  Temp: 98.6 F (37 C)  TempSrc: Oral  SpO2: 95%   Constitutional: NAD, calm, comfortable Eyes: PERRL, lids and conjunctivae normal ENMT: Mucous membranes are moist.  Neck: normal, supple Respiratory: clear to auscultation bilaterally, no wheezing, no crackles.  Cardiovascular: Regular rate and rhythm, no murmurs / rubs / gallops.  Trace lower extremity edema Abdomen: no tenderness, no masses, dressing intact Musculoskeletal: no clubbing / cyanosis. Normal muscle tone.  Skin: no rashes, lesions, ulcers. No induration Neurologic: Grossly Psychiatric: Normal judgment and insight. Alert and oriented x 3. Normal mood.   Labs on  Admission: I have personally reviewed following labs and imaging studies  CBC: Recent Labs  Lab 01/23/23 0615 01/25/23 0459 01/27/23 0735 01/28/23 1951 01/29/23 0548  WBC 5.7 6.1 6.0 7.1 12.1*  NEUTROABS  --  4.7  --  6.1  --   HGB 9.2* 9.5* 10.5* 9.8*  10.8*  HCT 28.3* 28.6* 31.7* 29.6* 32.6*  MCV 90.7 88.0 89.0 87.8 89.6  PLT 155 163 164 148* 158   Basic Metabolic Panel: Recent Labs  Lab 01/25/23 0459 01/27/23 0735 01/28/23 0621 01/28/23 1951 01/29/23 0548  NA 139 139 136 134* 137  K 3.3* 3.6 3.3* 3.1* 3.9  CL 103 105 101 101 102  CO2 24 23 24 23 22   GLUCOSE 111* 119* 118* 174* 131*  BUN 32* 42* 34* 33* 29*  CREATININE 2.37* 2.43* 2.31* 2.28* 2.05*  CALCIUM 8.4* 7.8* 7.8* 7.8* 8.9  MG 1.3* 1.5*  --   --  1.4*  PHOS  --   --  3.8  --  3.3   Liver Function Tests: Recent Labs  Lab 01/25/23 0459 01/28/23 0621 01/29/23 0548  AST 27  --   --   ALT 43  --   --   ALKPHOS 62  --   --   BILITOT 0.5  --   --   PROT 4.8*  --   --   ALBUMIN 1.8* 2.0* 2.4*   Coagulation Profile: No results for input(s): "INR", "PROTIME" in the last 168 hours. BNP (last 3 results) Recent Labs    07/11/22 1451  PROBNP 7.0   CBG: Recent Labs  Lab 01/22/23 1830 01/22/23 2318 01/23/23 0545 01/24/23 1648 01/29/23 1244  GLUCAP 102* 95 96 108* 124*   Thyroid Function Tests: No results for input(s): "TSH", "T4TOTAL", "FREET4", "T3FREE", "THYROIDAB" in the last 72 hours. Urine analysis:    Component Value Date/Time   COLORURINE YELLOW 01/27/2023 1713   APPEARANCEUR CLEAR 01/27/2023 1713   LABSPEC 1.008 01/27/2023 1713   PHURINE 5.0 01/27/2023 1713   GLUCOSEU NEGATIVE 01/27/2023 1713   HGBUR NEGATIVE 01/27/2023 1713   HGBUR negative 09/30/2009 0804   BILIRUBINUR NEGATIVE 01/27/2023 1713   BILIRUBINUR n 02/12/2015 0949   KETONESUR NEGATIVE 01/27/2023 1713   PROTEINUR NEGATIVE 01/27/2023 1713   UROBILINOGEN 0.2 02/12/2015 0949   UROBILINOGEN 0.2 09/30/2009 0804   NITRITE  NEGATIVE 01/27/2023 1713   LEUKOCYTESUR NEGATIVE 01/27/2023 1713     Radiological Exams on Admission: CT ABDOMEN PELVIS WO CONTRAST  Result Date: 01/29/2023 CLINICAL DATA:  Abdominal pain, acute, nonlocalized SMA thrombectomy and angioplasty with a stent to the distal SMA w/ vascular and ex-lap, EXAM: CT ABDOMEN AND PELVIS WITHOUT CONTRAST TECHNIQUE: Multidetector CT imaging of the abdomen and pelvis was performed following the standard protocol without IV contrast. RADIATION DOSE REDUCTION: This exam was performed according to the departmental dose-optimization program which includes automated exposure control, adjustment of the mA and/or kV according to patient size and/or use of iterative reconstruction technique. COMPARISON:  CT abdomen pelvis 12/21/2022 chole CTA GI bleed 12/29/2022 FINDINGS: Lower chest: Bilateral trace pleural effusions. Hepatobiliary: No focal liver abnormality. No gallstones, gallbladder wall thickening, or pericholecystic fluid. No biliary dilatation. Pancreas: No focal lesion. Normal pancreatic contour. No surrounding inflammatory changes. No main pancreatic ductal dilatation. Spleen: Normal in size without focal abnormality. Adrenals/Urinary Tract: No adrenal nodule bilaterally. No nephrolithiasis and no hydronephrosis. No definite contour-deforming renal mass. No ureterolithiasis or hydroureter. The urinary bladder is unremarkable. Stomach/Bowel: Small bowel resection surgical changes noted. Stomach is within normal limits. Similar-appearing dilatation of several loops of the small bowel within the left mid abdomen in the setting of small bowel resection surgical changes. These loops of small bowel no longer demonstrate bowel wall thickening but instead fecalized material within the lumen. Colonic diverticulosis. Under distended rectum with persistent trace perirectal fat  stranding. Appendix appears normal. Vascular/Lymphatic: No portal venous or mesenteric venous gas. Inferior  vena cava filter in grossly appropriate position. SMA stent. No abdominal aorta or iliac aneurysm. Severe atherosclerotic plaque of the aorta and its branches. No abdominal, pelvic, or inguinal lymphadenopathy. Reproductive: Prostate is unremarkable. Other: No intraperitoneal free fluid. No intraperitoneal free gas. No organized fluid collection. Musculoskeletal: No abdominal wall hernia or abnormality. No suspicious lytic or blastic osseous lesions. No acute displaced fracture. IMPRESSION: 1. Limited evaluation on this noncontrast study. 2. Similar-appearing dilatation of several loops of the small bowel within the left mid abdomen in the setting of small bowel resection surgical changes. These loops of small bowel no longer demonstrate bowel wall thickening but instead fecalized material within the lumen. Findings suggestive of a slow transition state with possible early or partial small bowel obstruction. No pneumatosis. No bowel wall perforation. Consider small-bowel follow-through radiographs series. 3. Under-distended rectum with persistent trace perirectal fat stranding. Recommend attention on follow-up. 4. Colonic diverticulosis with no acute diverticulitis. 5. Inferior vena cava filter in grossly appropriate position. 6.  Aortic Atherosclerosis (ICD10-I70.0)- severe. 7. Bilateral trace pleural effusions. Electronically Signed   By: Tish Frederickson M.D.   On: 01/29/2023 01:12   DG Abd 2 Views  Result Date: 01/28/2023 CLINICAL DATA:  Diarrhea EXAM: ABDOMEN - 2 VIEW COMPARISON:  12/28/2022 FINDINGS: The bowel gas pattern is normal. There is no evidence of free air. IVC filter in place. No radio-opaque calculi or other significant radiographic abnormality is seen. Vascular calcifications. IMPRESSION: Negative. Electronically Signed   By: Duanne Guess D.O.   On: 01/28/2023 18:56    Assessment/Plan Principal problem Concern for small bowel obstruction -CT scan done in rehab this morning showed small  bowel dilatation of several loops in the left mid abdomen, concerning for slow transition date with possible early or partial small bowel obstruction. -General Surgery saw patient today, appreciate follow-up.  He is status post multiple OR trips in September with small bowel resections with anastomosis.  He eventually had abdominal closure 9/16 with Dr. Andrey Campanile -Obtain plain abdominal film in the morning -Gentle IV fluids for 1 day -Not vomiting, currently appears stable, hold off placement of NG tube  Active problems Recent SMA occlusion, mesenteric ischemia, small bowel infarction-vascular surgery continued to follow while in rehab and saw patient this morning.  There is concern with his abdominal pain that the stent is occluded.  Complicating factor is the patient's inability to tolerate antiplatelet medications.  If the stent is occluded this has high mortality and it appears that Dr. Sherral Hammers discussed this with the patient and his wife. 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  Placed on SCDs  Acute DVT -this was diagnosed on 10/17 while in rehab, lower extremity Doppler showed acute DVT of the right popliteal vein, right peroneal vein,  left peroneal vein, left soleal vein, and left gastrocnemius vein.  Vascular surgery placed an IVC filter on 10/18.  Acute kidney injury on CKD 3b  - baseline Cr ~ 2.0, follows with outpatient nephrology. He has required temporary HD, last hemodialysis session was 10/18.  Nephrology followed, saw patient this morning in rehab and actually signed off.  Continue to watch renal function, for now hold off on continuing his diuretics as I will give him gentle hydration.  Per nephrology, if creatinine continues to improve with stable and good urine output tunneled dialysis may be removed later this week or early next week  History of small bowel GIST -  s/p resection in the past   Essential hypertension-strict n.p.o. as per general  surgery, hydralazine PRN  Hypokalemia, hypomagnesemia-continue to monitor and replace as indicated  Recent GI bleed -requiring transfusion.  GI consulted and underwent EGD and colonoscopy on 10/2, colonoscopy showed severe mucosal ulceration in the rectum and rectosigmoid colon, felt to be ischemic  BPH-hold tamsulosin.  Bladder scan every shift  PAF with RVR -while hospitalized last time, hold amiodarone for now while strictly n.p.o. monitor on telemetry.  Most recent 2D echo done April 2024 showed normal LVEF, grade 1 diastolic dysfunction.  Anemia of chronic illness-hemoglobin stable  Leukocytosis-noted.  Afebrile.  Will follow.   DVT prophylaxis: Lovenox  Code Status: Full code  Family Communication: no family at bedside Disposition Plan: CIR when ready  Bed Type: Progressive Consults called: Surgery, Vascular  Obs/Inp: Inpatient  At the time of admission, it appears that the appropriate admission status for this patient is INPATIENT as it is expected that patient will require hospital care > 2 midnights. This is judged to be reasonable and necessary in order to provide the required intensity of service to ensure the patient's safety given: presenting symptoms, initial radiographic and laboratory data and in the context of their chronic comorbidities. Together, these circumstances are felt to place patient at high at high risk for further clinical deterioration threatening life, limb, or organ.  Pamella Pert, MD, PhD [facility]  Contact via [email or web address]  01/29/2023, 4:32 PM

## 2023-01-29 NOTE — Progress Notes (Signed)
[facility]  Subjective  - Abdominal pain - 10/10 since yesterday. 2 bouts of vomiting    Objective (!) 194/86 100 98.2 F (36.8 C) (Oral) 18 97%  Abdominal exam - soft, no peritonitis. Epigastric pain  Laboratory Lab Results: Recent Labs    01/28/23 1951 01/29/23 0548  WBC 7.1 12.1*  HGB 9.8* 10.8*  HCT 29.6* 32.6*  PLT 148* 158   BMET Recent Labs    01/28/23 1951 01/29/23 0548  NA 134* 137  K 3.1* 3.9  CL 101 102  CO2 23 22  GLUCOSE 174* 131*  BUN 33* 29*  CREATININE 2.28* 2.05*  CALCIUM 7.8* 8.9    COAG Lab Results  Component Value Date   INR 1.7 (H) 12/29/2022   No results found for: "PTT"  Assessment/Planning:  Patient is a 67 year old male with previous history of SMA stenting for acute on chronic mesenteric ischemia. He has been unable to tolerate antiplatelet therapy or anticoagulation due to ischemic proctitis. He understands this is a very poor prognosis as stent patency is tied to antiplatelet therapy. Patient with new DVT, now status post IVC filter. Hairl had a 1 day history of severe abdominal pain, 10 out of 10.  He is not peritonitic on exam, but my concern is that the stent is occluded.  Unfortunately, with the inability to tolerate antiplatelet medication, if the stent is occluded, I do not think there is any utility in suction thrombectomy.  Interesting, while he does have a small leukocytosis, all other labs are normal.  I discussed this with general surgery.  I also discussed this with [name] and his wife.  I think that if the stent is occluded, this will result in mortality.  I am hopeful that [name] is experiencing may be a small bowel obstruction -this makes sense as he was administered Imodium over the last few days.  If he continues vomiting, recommend n.p.o., NG tube.   Will continue to follow. Would benefit from possible medicine readmission to the hospital until current issues resolved.  Victorino Sparrow 01/29/2023 8:53 AM --

## 2023-01-29 NOTE — Plan of Care (Signed)
Transferred from inpatient rehab. [name] [name] is a 67 year old male with pmh intestinal stromal tumor status post surgical intervention 2022, known mesenteric stenosis, essential hypertension, hyperlipidemia who was admitted on 9/11 with concerns of superior mesenteric artery thrombosis was started on IV heparin infusion vascular surgery was consulted, CT angiogram of abdomen and pelvis was done on 12/21/2022 that showed acute occlusion of the distal SMA as well as the inferior mesenteric artery aching to the OR emergently and was found to have a necrotic jejunum status post resection. SMA stent was placed, patient left with an open wound and remained on mechanical ventilation postoperatively. Had to be taken back to the OR several times as there was a concern of bowel integrity 12/25/2022 there were perforations so further surgical resection was done.  Also requiring dialysis during his stay along with blood transfusions.  Patient had been transferred to inpatient rehab on 10/16, but had recently developed increasing abdominal pain for which there is concern for bowel ischemia.  Transfer requested to inpatient side excepted to a progressive bed.  Dr. Dwain Sarna of surgery and Dr. Karin Lieu of vascular surgery have been involved in the patient's care.

## 2023-01-29 NOTE — Plan of Care (Signed)
Discharge to acute for medical management; unable to complete CIR program.

## 2023-01-29 NOTE — Discharge Summary (Signed)
Physician Discharge Summary  Patient ID: [name] [name] MRN: [id number] DOB/AGE: [birth year] 67 y.o.  Admit date: 01/24/2023 Discharge date: 01/29/2023  Discharge Diagnoses:  Principal Problem:   Debility Active Problems: Ischemic bowel Diarrhea Abdominal pain Acute on chronic renal failure Anorexia Acute blood loss anemia BLE DVTs Diffuse rash Urinary retention.  Discharged Condition: critical  Significant Diagnostic Studies: CT ABDOMEN PELVIS WO CONTRAST  Result Date: 01/29/2023 CLINICAL DATA:  Abdominal pain, acute, nonlocalized SMA thrombectomy and angioplasty with a stent to the distal SMA w/ vascular and ex-lap, EXAM: CT ABDOMEN AND PELVIS WITHOUT CONTRAST TECHNIQUE: Multidetector CT imaging of the abdomen and pelvis was performed following the standard protocol without IV contrast. RADIATION DOSE REDUCTION: This exam was performed according to the departmental dose-optimization program which includes automated exposure control, adjustment of the mA and/or kV according to patient size and/or use of iterative reconstruction technique. COMPARISON:  CT abdomen pelvis 12/21/2022 chole CTA GI bleed 12/29/2022 FINDINGS: Lower chest: Bilateral trace pleural effusions. Hepatobiliary: No focal liver abnormality. No gallstones, gallbladder wall thickening, or pericholecystic fluid. No biliary dilatation. Pancreas: No focal lesion. Normal pancreatic contour. No surrounding inflammatory changes. No main pancreatic ductal dilatation. Spleen: Normal in size without focal abnormality. Adrenals/Urinary Tract: No adrenal nodule bilaterally. No nephrolithiasis and no hydronephrosis. No definite contour-deforming renal mass. No ureterolithiasis or hydroureter. The urinary bladder is unremarkable. Stomach/Bowel: Small bowel resection surgical changes noted. Stomach is within normal limits. Similar-appearing dilatation of several loops of the small bowel within the left mid abdomen in the setting of small  bowel resection surgical changes. These loops of small bowel no longer demonstrate bowel wall thickening but instead fecalized material within the lumen. Colonic diverticulosis. Under distended rectum with persistent trace perirectal fat stranding. Appendix appears normal. Vascular/Lymphatic: No portal venous or mesenteric venous gas. Inferior vena cava filter in grossly appropriate position. SMA stent. No abdominal aorta or iliac aneurysm. Severe atherosclerotic plaque of the aorta and its branches. No abdominal, pelvic, or inguinal lymphadenopathy. Reproductive: Prostate is unremarkable. Other: No intraperitoneal free fluid. No intraperitoneal free gas. No organized fluid collection. Musculoskeletal: No abdominal wall hernia or abnormality. No suspicious lytic or blastic osseous lesions. No acute displaced fracture. IMPRESSION: 1. Limited evaluation on this noncontrast study. 2. Similar-appearing dilatation of several loops of the small bowel within the left mid abdomen in the setting of small bowel resection surgical changes. These loops of small bowel no longer demonstrate bowel wall thickening but instead fecalized material within the lumen. Findings suggestive of a slow transition state with possible early or partial small bowel obstruction. No pneumatosis. No bowel wall perforation. Consider small-bowel follow-through radiographs series. 3. Under-distended rectum with persistent trace perirectal fat stranding. Recommend attention on follow-up. 4. Colonic diverticulosis with no acute diverticulitis. 5. Inferior vena cava filter in grossly appropriate position. 6.  Aortic Atherosclerosis (ICD10-I70.0)- severe. 7. Bilateral trace pleural effusions. Electronically Signed   By: Tish Frederickson M.D.   On: 01/29/2023 01:12   DG Abd 2 Views  Result Date: 01/28/2023 CLINICAL DATA:  Diarrhea EXAM: ABDOMEN - 2 VIEW COMPARISON:  12/28/2022 FINDINGS: The bowel gas pattern is normal. There is no evidence of free air.  IVC filter in place. No radio-opaque calculi or other significant radiographic abnormality is seen. Vascular calcifications. IMPRESSION: Negative. Electronically Signed   By: Duanne Guess D.O.   On: 01/28/2023 18:56   PERIPHERAL VASCULAR CATHETERIZATION  Result Date: 01/26/2023 DATE OF SERVICE: 01/26/2023  PATIENT:  [name] [name]  67 y.o. male  PRE-OPERATIVE  DIAGNOSIS:  DVT, contraindication to anticoagulation  POST-OPERATIVE DIAGNOSIS:  Same  PROCEDURE:  Placement of inferior vena cava filter Conscious sedation (21 minutes)  SURGEON:  Surgeons and Role:    * Leonie Douglas, MD - Primary  ASSISTANT: none  ANESTHESIA:   local and IV sedation  EBL: minimal  BLOOD ADMINISTERED:none  DRAINS: none  LOCAL MEDICATIONS USED:  LIDOCAINE  SPECIMEN:  none  COUNTS: confirmed correct.  TOURNIQUET:  none  PATIENT DISPOSITION:  PACU - hemodynamically stable.  Delay start of Pharmacological VTE agent (>24hrs) due to surgical blood loss or risk of bleeding: no  INDICATION FOR PROCEDURE: [name] [name] is a 67 y.o. male with DVT and contraindication to anticoagulation. After careful discussion of risks, benefits, and alternatives the patient was offered IVC filter. The patient understood and wished to proceed.  OPERATIVE FINDINGS: successful deployment of IVCF  DESCRIPTION OF PROCEDURE: After identification of the patient in the pre-operative holding area, the patient was transferred to the operating room. The patient was positioned supine on the operating room table. Anesthesia was induced. The groins were prepped and draped in standard fashion. A surgical pause was performed confirming correct patient, procedure, and operative location.  Using ultrasound guidance, the right common femoral vein was accessed with micropuncture technique.  Using Seldinger technique, a micro sheath was introduced into the common femoral vein.  A Bentson wire was navigated into the inferior vena cava.  The tract was dilated.  A Cook select  system was prepared per [employer] instructions and brought onto the field.  The sheath was delivered into the infrarenal IVC.  Central venogram was performed and the renal anatomy was marked.  An IVC filter was then deployed well below the renal veins.  A follow-up venogram confirmed good position.  All endovascular equipment was removed.  Manual pressure was held on the groin.  Hemostasis was achieved.  A bandage was applied.  Upon completion of the case instrument and sharps counts were confirmed correct. The patient was transferred to the PACU in good condition. I was present for all portions of the procedure.  FOLLOW UP PLAN: Assuming a normal postoperative course, we will remove the IVC filter as soon as he can be anticoagulated safely.  [name]. Lenell Antu, MD [facility] Phone Number: [phone] 01/26/2023 2:26 PM     VAS [place] LOWER EXTREMITY VENOUS (DVT)  Result Date: 01/25/2023  Lower Venous DVT Study Patient Name:  [name] [name]  Date of Exam:   01/25/2023 Medical Rec #: [id number]      Accession #:    [id number] Date of Birth: 1955-04-19      Patient Gender: M Patient Age:   37 years Exam Location:  [facility] Procedure:      VAS [place] LOWER EXTREMITY VENOUS (DVT) Referring Phys: Yaakov Saindon --------------------------------------------------------------------------------  Indications: Immobility.  Risk Factors: None identified. Limitations: Poor ultrasound/tissue interface. Comparison Study: No prior studies. Performing Technologist: Chanda Busing RVT  Examination Guidelines: A complete evaluation includes B-mode imaging, spectral Doppler, color Doppler, and power Doppler as needed of all accessible portions of each vessel. Bilateral testing is considered an integral part of a complete examination. Limited examinations for reoccurring indications may be performed as noted. The reflux portion of the exam is performed with the patient in reverse  Trendelenburg.  +---------+---------------+---------+-----------+----------+--------------+ RIGHT    CompressibilityPhasicitySpontaneityPropertiesThrombus Aging +---------+---------------+---------+-----------+----------+--------------+ CFV      Full  Physician Discharge Summary  Patient ID: [name] [name] MRN: [id number] DOB/AGE: [birth year] 67 y.o.  Admit date: 01/24/2023 Discharge date: 01/29/2023  Discharge Diagnoses:  Principal Problem:   Debility Active Problems: Ischemic bowel Diarrhea Abdominal pain Acute on chronic renal failure Anorexia Acute blood loss anemia BLE DVTs Diffuse rash Urinary retention.  Discharged Condition: critical  Significant Diagnostic Studies: CT ABDOMEN PELVIS WO CONTRAST  Result Date: 01/29/2023 CLINICAL DATA:  Abdominal pain, acute, nonlocalized SMA thrombectomy and angioplasty with a stent to the distal SMA w/ vascular and ex-lap, EXAM: CT ABDOMEN AND PELVIS WITHOUT CONTRAST TECHNIQUE: Multidetector CT imaging of the abdomen and pelvis was performed following the standard protocol without IV contrast. RADIATION DOSE REDUCTION: This exam was performed according to the departmental dose-optimization program which includes automated exposure control, adjustment of the mA and/or kV according to patient size and/or use of iterative reconstruction technique. COMPARISON:  CT abdomen pelvis 12/21/2022 chole CTA GI bleed 12/29/2022 FINDINGS: Lower chest: Bilateral trace pleural effusions. Hepatobiliary: No focal liver abnormality. No gallstones, gallbladder wall thickening, or pericholecystic fluid. No biliary dilatation. Pancreas: No focal lesion. Normal pancreatic contour. No surrounding inflammatory changes. No main pancreatic ductal dilatation. Spleen: Normal in size without focal abnormality. Adrenals/Urinary Tract: No adrenal nodule bilaterally. No nephrolithiasis and no hydronephrosis. No definite contour-deforming renal mass. No ureterolithiasis or hydroureter. The urinary bladder is unremarkable. Stomach/Bowel: Small bowel resection surgical changes noted. Stomach is within normal limits. Similar-appearing dilatation of several loops of the small bowel within the left mid abdomen in the setting of small  bowel resection surgical changes. These loops of small bowel no longer demonstrate bowel wall thickening but instead fecalized material within the lumen. Colonic diverticulosis. Under distended rectum with persistent trace perirectal fat stranding. Appendix appears normal. Vascular/Lymphatic: No portal venous or mesenteric venous gas. Inferior vena cava filter in grossly appropriate position. SMA stent. No abdominal aorta or iliac aneurysm. Severe atherosclerotic plaque of the aorta and its branches. No abdominal, pelvic, or inguinal lymphadenopathy. Reproductive: Prostate is unremarkable. Other: No intraperitoneal free fluid. No intraperitoneal free gas. No organized fluid collection. Musculoskeletal: No abdominal wall hernia or abnormality. No suspicious lytic or blastic osseous lesions. No acute displaced fracture. IMPRESSION: 1. Limited evaluation on this noncontrast study. 2. Similar-appearing dilatation of several loops of the small bowel within the left mid abdomen in the setting of small bowel resection surgical changes. These loops of small bowel no longer demonstrate bowel wall thickening but instead fecalized material within the lumen. Findings suggestive of a slow transition state with possible early or partial small bowel obstruction. No pneumatosis. No bowel wall perforation. Consider small-bowel follow-through radiographs series. 3. Under-distended rectum with persistent trace perirectal fat stranding. Recommend attention on follow-up. 4. Colonic diverticulosis with no acute diverticulitis. 5. Inferior vena cava filter in grossly appropriate position. 6.  Aortic Atherosclerosis (ICD10-I70.0)- severe. 7. Bilateral trace pleural effusions. Electronically Signed   By: Tish Frederickson M.D.   On: 01/29/2023 01:12   DG Abd 2 Views  Result Date: 01/28/2023 CLINICAL DATA:  Diarrhea EXAM: ABDOMEN - 2 VIEW COMPARISON:  12/28/2022 FINDINGS: The bowel gas pattern is normal. There is no evidence of free air.  IVC filter in place. No radio-opaque calculi or other significant radiographic abnormality is seen. Vascular calcifications. IMPRESSION: Negative. Electronically Signed   By: Duanne Guess D.O.   On: 01/28/2023 18:56   PERIPHERAL VASCULAR CATHETERIZATION  Result Date: 01/26/2023 DATE OF SERVICE: 01/26/2023  PATIENT:  [name] [name]  67 y.o. male  PRE-OPERATIVE  DIAGNOSIS:  DVT, contraindication to anticoagulation  POST-OPERATIVE DIAGNOSIS:  Same  PROCEDURE:  Placement of inferior vena cava filter Conscious sedation (21 minutes)  SURGEON:  Surgeons and Role:    * Leonie Douglas, MD - Primary  ASSISTANT: none  ANESTHESIA:   local and IV sedation  EBL: minimal  BLOOD ADMINISTERED:none  DRAINS: none  LOCAL MEDICATIONS USED:  LIDOCAINE  SPECIMEN:  none  COUNTS: confirmed correct.  TOURNIQUET:  none  PATIENT DISPOSITION:  PACU - hemodynamically stable.  Delay start of Pharmacological VTE agent (>24hrs) due to surgical blood loss or risk of bleeding: no  INDICATION FOR PROCEDURE: [name] [name] is a 67 y.o. male with DVT and contraindication to anticoagulation. After careful discussion of risks, benefits, and alternatives the patient was offered IVC filter. The patient understood and wished to proceed.  OPERATIVE FINDINGS: successful deployment of IVCF  DESCRIPTION OF PROCEDURE: After identification of the patient in the pre-operative holding area, the patient was transferred to the operating room. The patient was positioned supine on the operating room table. Anesthesia was induced. The groins were prepped and draped in standard fashion. A surgical pause was performed confirming correct patient, procedure, and operative location.  Using ultrasound guidance, the right common femoral vein was accessed with micropuncture technique.  Using Seldinger technique, a micro sheath was introduced into the common femoral vein.  A Bentson wire was navigated into the inferior vena cava.  The tract was dilated.  A Cook select  system was prepared per [employer] instructions and brought onto the field.  The sheath was delivered into the infrarenal IVC.  Central venogram was performed and the renal anatomy was marked.  An IVC filter was then deployed well below the renal veins.  A follow-up venogram confirmed good position.  All endovascular equipment was removed.  Manual pressure was held on the groin.  Hemostasis was achieved.  A bandage was applied.  Upon completion of the case instrument and sharps counts were confirmed correct. The patient was transferred to the PACU in good condition. I was present for all portions of the procedure.  FOLLOW UP PLAN: Assuming a normal postoperative course, we will remove the IVC filter as soon as he can be anticoagulated safely.  [name]. Lenell Antu, MD [facility] Phone Number: [phone] 01/26/2023 2:26 PM     VAS [place] LOWER EXTREMITY VENOUS (DVT)  Result Date: 01/25/2023  Lower Venous DVT Study Patient Name:  [name] [name]  Date of Exam:   01/25/2023 Medical Rec #: [id number]      Accession #:    [id number] Date of Birth: 1955-04-19      Patient Gender: M Patient Age:   37 years Exam Location:  [facility] Procedure:      VAS [place] LOWER EXTREMITY VENOUS (DVT) Referring Phys: Yaakov Saindon --------------------------------------------------------------------------------  Indications: Immobility.  Risk Factors: None identified. Limitations: Poor ultrasound/tissue interface. Comparison Study: No prior studies. Performing Technologist: Chanda Busing RVT  Examination Guidelines: A complete evaluation includes B-mode imaging, spectral Doppler, color Doppler, and power Doppler as needed of all accessible portions of each vessel. Bilateral testing is considered an integral part of a complete examination. Limited examinations for reoccurring indications may be performed as noted. The reflux portion of the exam is performed with the patient in reverse  Trendelenburg.  +---------+---------------+---------+-----------+----------+--------------+ RIGHT    CompressibilityPhasicitySpontaneityPropertiesThrombus Aging +---------+---------------+---------+-----------+----------+--------------+ CFV      Full  DIAGNOSIS:  DVT, contraindication to anticoagulation  POST-OPERATIVE DIAGNOSIS:  Same  PROCEDURE:  Placement of inferior vena cava filter Conscious sedation (21 minutes)  SURGEON:  Surgeons and Role:    * Leonie Douglas, MD - Primary  ASSISTANT: none  ANESTHESIA:   local and IV sedation  EBL: minimal  BLOOD ADMINISTERED:none  DRAINS: none  LOCAL MEDICATIONS USED:  LIDOCAINE  SPECIMEN:  none  COUNTS: confirmed correct.  TOURNIQUET:  none  PATIENT DISPOSITION:  PACU - hemodynamically stable.  Delay start of Pharmacological VTE agent (>24hrs) due to surgical blood loss or risk of bleeding: no  INDICATION FOR PROCEDURE: [name] [name] is a 67 y.o. male with DVT and contraindication to anticoagulation. After careful discussion of risks, benefits, and alternatives the patient was offered IVC filter. The patient understood and wished to proceed.  OPERATIVE FINDINGS: successful deployment of IVCF  DESCRIPTION OF PROCEDURE: After identification of the patient in the pre-operative holding area, the patient was transferred to the operating room. The patient was positioned supine on the operating room table. Anesthesia was induced. The groins were prepped and draped in standard fashion. A surgical pause was performed confirming correct patient, procedure, and operative location.  Using ultrasound guidance, the right common femoral vein was accessed with micropuncture technique.  Using Seldinger technique, a micro sheath was introduced into the common femoral vein.  A Bentson wire was navigated into the inferior vena cava.  The tract was dilated.  A Cook select  system was prepared per [employer] instructions and brought onto the field.  The sheath was delivered into the infrarenal IVC.  Central venogram was performed and the renal anatomy was marked.  An IVC filter was then deployed well below the renal veins.  A follow-up venogram confirmed good position.  All endovascular equipment was removed.  Manual pressure was held on the groin.  Hemostasis was achieved.  A bandage was applied.  Upon completion of the case instrument and sharps counts were confirmed correct. The patient was transferred to the PACU in good condition. I was present for all portions of the procedure.  FOLLOW UP PLAN: Assuming a normal postoperative course, we will remove the IVC filter as soon as he can be anticoagulated safely.  [name]. Lenell Antu, MD [facility] Phone Number: [phone] 01/26/2023 2:26 PM     VAS [place] LOWER EXTREMITY VENOUS (DVT)  Result Date: 01/25/2023  Lower Venous DVT Study Patient Name:  [name] [name]  Date of Exam:   01/25/2023 Medical Rec #: [id number]      Accession #:    [id number] Date of Birth: 1955-04-19      Patient Gender: M Patient Age:   37 years Exam Location:  [facility] Procedure:      VAS [place] LOWER EXTREMITY VENOUS (DVT) Referring Phys: Yaakov Saindon --------------------------------------------------------------------------------  Indications: Immobility.  Risk Factors: None identified. Limitations: Poor ultrasound/tissue interface. Comparison Study: No prior studies. Performing Technologist: Chanda Busing RVT  Examination Guidelines: A complete evaluation includes B-mode imaging, spectral Doppler, color Doppler, and power Doppler as needed of all accessible portions of each vessel. Bilateral testing is considered an integral part of a complete examination. Limited examinations for reoccurring indications may be performed as noted. The reflux portion of the exam is performed with the patient in reverse  Trendelenburg.  +---------+---------------+---------+-----------+----------+--------------+ RIGHT    CompressibilityPhasicitySpontaneityPropertiesThrombus Aging +---------+---------------+---------+-----------+----------+--------------+ CFV      Full  DIAGNOSIS:  DVT, contraindication to anticoagulation  POST-OPERATIVE DIAGNOSIS:  Same  PROCEDURE:  Placement of inferior vena cava filter Conscious sedation (21 minutes)  SURGEON:  Surgeons and Role:    * Leonie Douglas, MD - Primary  ASSISTANT: none  ANESTHESIA:   local and IV sedation  EBL: minimal  BLOOD ADMINISTERED:none  DRAINS: none  LOCAL MEDICATIONS USED:  LIDOCAINE  SPECIMEN:  none  COUNTS: confirmed correct.  TOURNIQUET:  none  PATIENT DISPOSITION:  PACU - hemodynamically stable.  Delay start of Pharmacological VTE agent (>24hrs) due to surgical blood loss or risk of bleeding: no  INDICATION FOR PROCEDURE: [name] [name] is a 67 y.o. male with DVT and contraindication to anticoagulation. After careful discussion of risks, benefits, and alternatives the patient was offered IVC filter. The patient understood and wished to proceed.  OPERATIVE FINDINGS: successful deployment of IVCF  DESCRIPTION OF PROCEDURE: After identification of the patient in the pre-operative holding area, the patient was transferred to the operating room. The patient was positioned supine on the operating room table. Anesthesia was induced. The groins were prepped and draped in standard fashion. A surgical pause was performed confirming correct patient, procedure, and operative location.  Using ultrasound guidance, the right common femoral vein was accessed with micropuncture technique.  Using Seldinger technique, a micro sheath was introduced into the common femoral vein.  A Bentson wire was navigated into the inferior vena cava.  The tract was dilated.  A Cook select  system was prepared per [employer] instructions and brought onto the field.  The sheath was delivered into the infrarenal IVC.  Central venogram was performed and the renal anatomy was marked.  An IVC filter was then deployed well below the renal veins.  A follow-up venogram confirmed good position.  All endovascular equipment was removed.  Manual pressure was held on the groin.  Hemostasis was achieved.  A bandage was applied.  Upon completion of the case instrument and sharps counts were confirmed correct. The patient was transferred to the PACU in good condition. I was present for all portions of the procedure.  FOLLOW UP PLAN: Assuming a normal postoperative course, we will remove the IVC filter as soon as he can be anticoagulated safely.  [name]. Lenell Antu, MD [facility] Phone Number: [phone] 01/26/2023 2:26 PM     VAS [place] LOWER EXTREMITY VENOUS (DVT)  Result Date: 01/25/2023  Lower Venous DVT Study Patient Name:  [name] [name]  Date of Exam:   01/25/2023 Medical Rec #: [id number]      Accession #:    [id number] Date of Birth: 1955-04-19      Patient Gender: M Patient Age:   37 years Exam Location:  [facility] Procedure:      VAS [place] LOWER EXTREMITY VENOUS (DVT) Referring Phys: Yaakov Saindon --------------------------------------------------------------------------------  Indications: Immobility.  Risk Factors: None identified. Limitations: Poor ultrasound/tissue interface. Comparison Study: No prior studies. Performing Technologist: Chanda Busing RVT  Examination Guidelines: A complete evaluation includes B-mode imaging, spectral Doppler, color Doppler, and power Doppler as needed of all accessible portions of each vessel. Bilateral testing is considered an integral part of a complete examination. Limited examinations for reoccurring indications may be performed as noted. The reflux portion of the exam is performed with the patient in reverse  Trendelenburg.  +---------+---------------+---------+-----------+----------+--------------+ RIGHT    CompressibilityPhasicitySpontaneityPropertiesThrombus Aging +---------+---------------+---------+-----------+----------+--------------+ CFV      Full

## 2023-01-29 NOTE — Significant Event (Signed)
Rapid Response Event Note   Reason for Call :  Second set of eyes  Initial Focused Assessment:  Lying in bed, arouses to voice, A&Ox4. Quite pallor in color. Skin warm and dry. Lungs clear, heart tones normal. Abdominal wound almost healed. BLE minimal edema. Yesterday abdominal pain began, CT ordered. Emesis x2 when taking PO meds. LBM per patient 10/20 AM, diarrhea, + flatus, hypoactive BS. Pain meds changed to IV dilaudid with relief today per team. Labs today noted.   179/90 (113) HR 110 RR 20 O2 94% RA CBG 124  Interventions/Plan of Care:  TRH consulted, tx back to acute  Event Summary:  MD Notified: Cipriano Mile PA Call Time: 1218 Arrival Time: 1230 End Time:  Truddie Crumble, RN

## 2023-01-29 NOTE — Progress Notes (Signed)
PROGRESS NOTE   Subjective/Complaints: Patient continues to have rash over his left elbow and left lower back.  Rashes were present on admission but has worsened overall.  Rash looks about the same as yesterday.  Denies any significant pain or itching at these rashes.  Patient has been having urinary retention.  ROS: Patient denies fever, chills,  dizziness, nausea, vomiting, cough, shortness of breath or chest pain, headache, or mood change. + Generalized fatigue + Diarrhea-ongoing +Rash   Objective:   CT ABDOMEN PELVIS WO CONTRAST  Result Date: 01/29/2023 CLINICAL DATA:  Abdominal pain, acute, nonlocalized SMA thrombectomy and angioplasty with a stent to the distal SMA w/ vascular and ex-lap, EXAM: CT ABDOMEN AND PELVIS WITHOUT CONTRAST TECHNIQUE: Multidetector CT imaging of the abdomen and pelvis was performed following the standard protocol without IV contrast. RADIATION DOSE REDUCTION: This exam was performed according to the departmental dose-optimization program which includes automated exposure control, adjustment of the mA and/or kV according to patient size and/or use of iterative reconstruction technique. COMPARISON:  CT abdomen pelvis 12/21/2022 chole CTA GI bleed 12/29/2022 FINDINGS: Lower chest: Bilateral trace pleural effusions. Hepatobiliary: No focal liver abnormality. No gallstones, gallbladder wall thickening, or pericholecystic fluid. No biliary dilatation. Pancreas: No focal lesion. Normal pancreatic contour. No surrounding inflammatory changes. No main pancreatic ductal dilatation. Spleen: Normal in size without focal abnormality. Adrenals/Urinary Tract: No adrenal nodule bilaterally. No nephrolithiasis and no hydronephrosis. No definite contour-deforming renal mass. No ureterolithiasis or hydroureter. The urinary bladder is unremarkable. Stomach/Bowel: Small bowel resection surgical changes noted. Stomach is within normal  limits. Similar-appearing dilatation of several loops of the small bowel within the left mid abdomen in the setting of small bowel resection surgical changes. These loops of small bowel no longer demonstrate bowel wall thickening but instead fecalized material within the lumen. Colonic diverticulosis. Under distended rectum with persistent trace perirectal fat stranding. Appendix appears normal. Vascular/Lymphatic: No portal venous or mesenteric venous gas. Inferior vena cava filter in grossly appropriate position. SMA stent. No abdominal aorta or iliac aneurysm. Severe atherosclerotic plaque of the aorta and its branches. No abdominal, pelvic, or inguinal lymphadenopathy. Reproductive: Prostate is unremarkable. Other: No intraperitoneal free fluid. No intraperitoneal free gas. No organized fluid collection. Musculoskeletal: No abdominal wall hernia or abnormality. No suspicious lytic or blastic osseous lesions. No acute displaced fracture. IMPRESSION: 1. Limited evaluation on this noncontrast study. 2. Similar-appearing dilatation of several loops of the small bowel within the left mid abdomen in the setting of small bowel resection surgical changes. These loops of small bowel no longer demonstrate bowel wall thickening but instead fecalized material within the lumen. Findings suggestive of a slow transition state with possible early or partial small bowel obstruction. No pneumatosis. No bowel wall perforation. Consider small-bowel follow-through radiographs series. 3. Under-distended rectum with persistent trace perirectal fat stranding. Recommend attention on follow-up. 4. Colonic diverticulosis with no acute diverticulitis. 5. Inferior vena cava filter in grossly appropriate position. 6.  Aortic Atherosclerosis (ICD10-I70.0)- severe. 7. Bilateral trace pleural effusions. Electronically Signed   By: Tish Frederickson M.D.   On: 01/29/2023 01:12   DG Abd 2 Views  Result Date: 01/28/2023 CLINICAL DATA:  Diarrhea  EXAM:  ABDOMEN - 2 VIEW COMPARISON:  12/28/2022 FINDINGS: The bowel gas pattern is normal. There is no evidence of free air. IVC filter in place. No radio-opaque calculi or other significant radiographic abnormality is seen. Vascular calcifications. IMPRESSION: Negative. Electronically Signed   By: Duanne Guess D.O.   On: 01/28/2023 18:56   Recent Labs    01/28/23 1951 01/29/23 0548  WBC 7.1 12.1*  HGB 9.8* 10.8*  HCT 29.6* 32.6*  PLT 148* 158   Recent Labs    01/28/23 1951 01/29/23 0548  NA 134* 137  K 3.1* 3.9  CL 101 102  CO2 23 22  GLUCOSE 174* 131*  BUN 33* 29*  CREATININE 2.28* 2.05*  CALCIUM 7.8* 8.9    Intake/Output Summary (Last 24 hours) at 01/29/2023 6045 Last data filed at 01/29/2023 4098 Gross per 24 hour  Intake 1282.82 ml  Output 1351 ml  Net -68.18 ml     Pressure Injury 01/04/23 Buttocks Right;Upper Unstageable - Full thickness tissue loss in which the base of the injury is covered by slough (yellow, tan, gray, green or brown) and/or eschar (tan, brown or black) in the wound bed. (Active)  01/04/23 2100  Location: Buttocks  Location Orientation: Right;Upper  Staging: Unstageable - Full thickness tissue loss in which the base of the injury is covered by slough (yellow, tan, gray, green or brown) and/or eschar (tan, brown or black) in the wound bed.  Wound Description (Comments):   Present on Admission: No    Physical Exam: Vital Signs Blood pressure (!) 194/86, pulse 100, temperature 98.2 F (36.8 C), temperature source Oral, resp. rate 18, weight 104.6 kg, SpO2 97%.  Gen: no distress, normal appearing, lying in bed HEENT: oral mucosa pink and moist, NCAT Cardio: RRR, no increased work of breathing Chest: CTAB, normal effort, normal rate of breathing Abd: soft, non-distended, positive bowel sounds Ext: 1+ lower extremity edema Psych: pleasant, normal affect Skin: +R internal jugular catheter. Unstageable w/ slough and eschar to right upper  buttock with foam dressing in place, abdominal incision covered with dressing that is clean and dry. Rash noted L back and L arm- increased warmth at the left arm, see images  Neuro: Follows commands, awake and alert Moving all 4 extremities to gravity and resistance, judgment and insight appears to be overall normal, sensation to light touch intact in all 4 extremities Not able to recall issues with urinary retention before admission to CIR       Assessment/Plan: 1. Functional deficits which require 3+ hours per day of interdisciplinary therapy in a comprehensive inpatient rehab setting. Physiatrist is providing close team supervision and 24 hour management of active medical problems listed below. Physiatrist and rehab team continue to assess barriers to discharge/monitor patient progress toward functional and medical goals  Care Tool:  Bathing    Body parts bathed by patient: Right arm, Left arm, Chest, Front perineal area, Face   Body parts bathed by helper: Buttocks, Right upper leg, Left upper leg, Left lower leg, Right lower leg     Bathing assist Assist Level: Maximal Assistance - Patient 24 - 49%     Upper Body Dressing/Undressing Upper body dressing   What is the patient wearing?: Pull over shirt    Upper body assist Assist Level: Minimal Assistance - Patient > 75%    Lower Body Dressing/Undressing Lower body dressing      What is the patient wearing?: Pants, Incontinence brief     Lower body assist Assist for lower body  dressing: Moderate Assistance - Patient 50 - 74%     Toileting Toileting    Toileting assist Assist for toileting: Contact Guard/Touching assist     Transfers Chair/bed transfer  Transfers assist  Chair/bed transfer activity did not occur: Safety/medical concerns  Chair/bed transfer assist level: Minimal Assistance - Patient > 75%     Locomotion Ambulation   Ambulation assist   Ambulation activity did not occur: Safety/medical  concerns  Assist level: Minimal Assistance - Patient > 75% Assistive device: No Device     Walk 10 feet activity   Assist  Walk 10 feet activity did not occur: Safety/medical concerns  Assist level: Minimal Assistance - Patient > 75% Assistive device: No Device   Walk 50 feet activity   Assist Walk 50 feet with 2 turns activity did not occur: Safety/medical concerns  Assist level: Minimal Assistance - Patient > 75% Assistive device: No Device    Walk 150 feet activity   Assist Walk 150 feet activity did not occur: Safety/medical concerns    Assistive device: No Device    Walk 10 feet on uneven surface  activity   Assist Walk 10 feet on uneven surfaces activity did not occur: Safety/medical concerns   Assist level: Minimal Assistance - Patient > 75%     Wheelchair     Assist Is the patient using a wheelchair?: Yes Type of Wheelchair: Manual    Wheelchair assist level: Minimal Assistance - Patient > 75% Max wheelchair distance: 150+    Wheelchair 50 feet with 2 turns activity    Assist        Assist Level: Minimal Assistance - Patient > 75%   Wheelchair 150 feet activity     Assist      Assist Level: Minimal Assistance - Patient > 75%   Blood pressure (!) 194/86, pulse 100, temperature 98.2 F (36.8 C), temperature source Oral, resp. rate 18, weight 104.6 kg, SpO2 97%.  Medical Problem List and Plan: 1.Debility 2/2 necrotic jejunum s/p resection             -will discuss with surgery whether patient may shower             -ELOS/Goals: 10 days modI             -Continue CIR therapies including PT, OT as possible.   -10/21 in current state, he's unable to participate in therapy--if pain doesn't improve will have to look at transfer back to acute. Spoke with pt/wife regarding plan.  2.  Antithrombotics: -DVT/anticoagulation:  Mechanical: Sequential compression devices, below knee Bilateral lower extremities             -antiplatelet  therapy: N/A due to GIB 3. Pain Management:  Oxycodone prn.  4. Mood/Behavior/Sleep: LCSW to follow for evaluation and support.              -antipsychotic agents: N/A             --Melatonin prn for insomnia.  5. Neuropsych/cognition: This patient is capable of making decisions on his own behalf. 6. Skin/Wound Care: Routine pressure relief measures. Wet to dry dressing changes BID             --Air mattress overalay for DTI on buttocks.  7. Fluids/Electrolytes/Nutrition: Strict I/O. Monitor renal status daily.  Continue nutritional supplements             --Will change ensure to [facility] instead. .  -10/17 replete potassium K-Dur to 30 mEq in the  morning and 20 mEq in the evening.  It appears he got 40 mEq on 10/15 and this brought him up to 3.5 the next day.  Replete magnesium IV 2 g for hypomagnesemia recheck tomorrow   -10/19 potassium improved to 3.6, magnesium 1.5, repleted with magnesium IV 2 g ordered  10/20 patient was started on 40 mEq Klor-Con M by nephrology, recheck magnesium tomorrow  10/21 potassium 3.9, Cr slowly improving 8. 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   -10/21--CT reviewed. Discussed with vascular surgery this morning   -continue NPO, consider NGT   -pain control---add IV dilaudid   -imodium stopped   -await any further surgery recs   -exam, labs not overly impressive. Does have 12k white count however   -not much to do surgically if this is ischemia from stent occlusion    9. H/o Small bowel GIST s/p resection: Complicated hx with prior GIBs, duodenal ulcers, intestinal  angioectasias  w/hemorrhage, diverticulosis w/hemorrhage, gastritis and duodenitis             --consult GI for recurrent bleed.              --continue Protonix 40 mg IV  BID 10. ABLA: Due to multiple episodes of rectal bleeding due to deep and severe ulceration in rectum/rectosigmoid  --not a candidate fro anticoagulation due to risk of bleeding.  --Required multiple transfusions 10/01 --to transfuse prn Hgb<7 -hgb up to 10.8 today 10/21 11. AKI on CKD: Multifactorial due to surgery, contrast and hypotension. Baseline SCr-2.0 (followed by Dr. Glenna Fellows).              --strict I/O. Monitor PVRs and cath to keep volumes <350 cc             --required CRRT followed by HD-->last HD on 10/12 and internal jugular in place.              --Was started on torsemide 10/16 due to BLE edema  -10/19 nephrology to hold diuretics, creatinine has been increasing  -10/20 creatinine trending back down today, nephrology following 12. A fib w/RVR: Monitor HR with increase in activity. Continue amiodarone    -10/21 magnesium needs replenishment. 1.4 13. R-RAS: No plans for intervention as small and felt to have minimal function   14. HTN: Monitor BP TID--continue Norvasc, hydralazine and Demadex             --monitor electrolytes for recurrent hypokalemia. Recheck Mg level in am. Add magnesium gluconate 250mg  HS  -10/17 give magnesium sulfate 2 g IV for hypomagnesia, called pharmacy to check given renal function and should be okay.    -Patient was noted to have dizziness, possible orthostatic hypotension symptoms while wearing compression to b/l LE, will ask nephrology if going down on -discussed with Dr. Mikey College current medications and monitor.  Consider increase Norvasc dose due to baseline hypertension.  -BP intermittently elevated, continue compression garments for orthostatic hypotension     01/29/2023    7:43 AM 01/29/2023    5:00 AM 01/29/2023    3:31 AM  Vitals with BMI  Weight  230 lbs 11 oz   Systolic 194  184  Diastolic 86  90  Pulse 100  95      15. Unstageable  with slough and eschar to right buttock: continue Medihoney with foam dressing for  debridement and moderate drainage.    16. Diarrhea: Multifactorial -->continue banatrol, Will schedule lomotil qid and monitor  -10/17 change Banatrol to FiberCon  -10/18 last several BMs not described as liquid, improving  -10/19 continue Gerhard's  -10/20 abdominal x-ray ordered  -10/21 CT with dilated bowels, ileus/SBO?---imodium stopped 17.  Right eye irritation.  Nursing reported redness to right eye conjunctiva, no purulent drainage.  Start Lacri-Lube and monitor.   -10/18 appears a little improved today  18.  Bilateral DVTs  -10/19 IVC filter placed by Dr. Sharene Skeans for DVT  19. Rash/skin infection L upper back and L elbow  -Start Doxycycline 10/19  -10/21stable to improved 20.  Hemorrhoids  -Anusol cream  21.  Urinary retention  -recent UA negative  -continue I/O caths  -up to toilet/BSC as possible given pain  -continue flomax LOS: 5 days A FACE TO FACE EVALUATION WAS PERFORMED  Ranelle Oyster 01/29/2023, 9:05 AM

## 2023-01-30 ENCOUNTER — Inpatient Hospital Stay (HOSPITAL_COMMUNITY): Payer: Medicare Other

## 2023-01-30 DIAGNOSIS — K56609 Unspecified intestinal obstruction, unspecified as to partial versus complete obstruction: Secondary | ICD-10-CM | POA: Diagnosis not present

## 2023-01-30 LAB — MAGNESIUM: Magnesium: 2.1 mg/dL (ref 1.7–2.4)

## 2023-01-30 LAB — TYPE AND SCREEN
ABO/RH(D): A POS
Antibody Screen: NEGATIVE

## 2023-01-30 LAB — PROCALCITONIN: Procalcitonin: 1.15 ng/mL

## 2023-01-30 LAB — CBC
HCT: 32.2 % — ABNORMAL LOW (ref 39.0–52.0)
Hemoglobin: 10.9 g/dL — ABNORMAL LOW (ref 13.0–17.0)
MCH: 30.4 pg (ref 26.0–34.0)
MCHC: 33.9 g/dL (ref 30.0–36.0)
MCV: 89.7 fL (ref 80.0–100.0)
Platelets: 171 10*3/uL (ref 150–400)
RBC: 3.59 MIL/uL — ABNORMAL LOW (ref 4.22–5.81)
RDW: 14.8 % (ref 11.5–15.5)
WBC: 20.9 10*3/uL — ABNORMAL HIGH (ref 4.0–10.5)
nRBC: 0 % (ref 0.0–0.2)

## 2023-01-30 LAB — COMPREHENSIVE METABOLIC PANEL
ALT: 32 U/L (ref 0–44)
AST: 21 U/L (ref 15–41)
Albumin: 2.1 g/dL — ABNORMAL LOW (ref 3.5–5.0)
Alkaline Phosphatase: 77 U/L (ref 38–126)
Anion gap: 13 (ref 5–15)
BUN: 33 mg/dL — ABNORMAL HIGH (ref 8–23)
CO2: 22 mmol/L (ref 22–32)
Calcium: 9 mg/dL (ref 8.9–10.3)
Chloride: 103 mmol/L (ref 98–111)
Creatinine, Ser: 2.08 mg/dL — ABNORMAL HIGH (ref 0.61–1.24)
GFR, Estimated: 34 mL/min — ABNORMAL LOW (ref >60)
Glucose, Bld: 146 mg/dL — ABNORMAL HIGH (ref 70–99)
Potassium: 4 mmol/L (ref 3.5–5.1)
Sodium: 138 mmol/L (ref 135–145)
Total Bilirubin: 1 mg/dL (ref 0.3–1.2)
Total Protein: 5.4 g/dL — ABNORMAL LOW (ref 6.5–8.1)

## 2023-01-30 LAB — C-REACTIVE PROTEIN: CRP: 20 mg/dL — ABNORMAL HIGH (ref <1.0)

## 2023-01-30 LAB — BRAIN NATRIURETIC PEPTIDE: B Natriuretic Peptide: 253.6 pg/mL — ABNORMAL HIGH (ref 0.0–100.0)

## 2023-01-30 LAB — PROTIME-INR
INR: 1.2 (ref 0.8–1.2)
Prothrombin Time: 15.3 s — ABNORMAL HIGH (ref 11.4–15.2)

## 2023-01-30 MED ORDER — DEXTROSE-SODIUM CHLORIDE 5-0.45 % IV SOLN: INTRAVENOUS | Status: AC

## 2023-01-30 MED ORDER — LABETALOL HCL 5 MG/ML IV SOLN
10.0000 mg | INTRAVENOUS | Status: DC | PRN
  Administered 2023-01-30 – 2023-02-07 (×12): 10 mg via INTRAVENOUS
  Filled 2023-01-30 (×11): qty 4

## 2023-01-30 MED ORDER — HYDRALAZINE HCL 20 MG/ML IJ SOLN
10.0000 mg | INTRAMUSCULAR | Status: DC | PRN
  Administered 2023-01-30 – 2023-02-07 (×6): 10 mg via INTRAVENOUS
  Filled 2023-01-30 (×6): qty 1

## 2023-01-30 MED ORDER — CLOBETASOL PROPIONATE 0.05 % EX CREA
TOPICAL_CREAM | Freq: Two times a day (BID) | CUTANEOUS | Status: DC
  Administered 2023-01-31 – 2023-02-01 (×2): 1 via TOPICAL
  Filled 2023-01-30 (×2): qty 15

## 2023-01-30 MED ORDER — PANTOPRAZOLE SODIUM 40 MG IV SOLR
40.0000 mg | Freq: Every day | INTRAVENOUS | Status: DC
Start: 2023-01-30 — End: 2023-01-31
  Administered 2023-01-30 – 2023-01-31 (×2): 40 mg via INTRAVENOUS
  Filled 2023-01-30 (×2): qty 10

## 2023-01-30 NOTE — Evaluation (Signed)
Physical Therapy Evaluation Patient Details Name: [name] [name] MRN: [id number] DOB: 1955/05/06 Today's Date: 01/30/2023  History of Present Illness  67 y.o. male was in inpatient rehab who comes into the hospital 01/29/23 with abdominal pain and ?SBO. 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 Recent RLE DVT with IVC placed. PMHx:testicular CA, HTN, ankle and patellar fx's, retinal detachment, intestinal stromal tumor 2022,  Clinical Impression   Pt admitted secondary to problem above with deficits below. PTA patient was on inpatient rehab and mobilizing at [facility] level (ambulating 200 ft, stairs, car transfer). Pt currently requires CGA for standing and unable to progress to ambulation due to orthostasis (see vitals flowsheet). Patient reports he does have TED hose in his belongings and no abdominal binder (likely due to abd surgery). States TED hose didn't really help. May need to try TED hose plus ace wrap and counter-pressure exercises to maintain BP. Discussed discharge plan and do not feel pt will need to return to inpt rehab. Pt agrees and is anxious to go home. Currently recommend HHPT, and may advance to no needs.  Anticipate patient will benefit from PT to address problems listed below.Will continue to follow acutely to maximize functional mobility independence and safety.           If plan is discharge home, recommend the following: Assistance with cooking/housework;Assist for transportation;A little help with walking and/or transfers;Help with stairs or ramp for entrance   Can travel by private vehicle        Equipment Recommendations None recommended by PT  Recommendations for Other Services       Functional Status Assessment Patient has had a recent decline in their functional status and demonstrates the ability to make significant improvements in function in a reasonable and predictable  amount of time.     Precautions / Restrictions Precautions Precautions: Fall;Other (comment) Precaution Comments: othostasis (has TED hose); abdominal incision Restrictions Weight Bearing Restrictions: No      Mobility  Bed Mobility Overal bed mobility: Needs Assistance Bed Mobility: Rolling, Sidelying to Sit, Sit to Sidelying Rolling: Independent Sidelying to sit: Modified independent (Device/Increase time), HOB elevated     Sit to sidelying: Independent General bed mobility comments: no cues needed    Transfers Overall transfer level: Needs assistance Equipment used: None Transfers: Sit to/from Stand Sit to Stand: Contact guard assist           General transfer comment: guarding assist due to decr BP and dizziness    Ambulation/Gait               General Gait Details: Unable to stand for 3 minute orthostatic BP check due to dizziness. Reports worse than on rehab and felt he could not walk.  Stairs            Wheelchair Mobility     Tilt Bed    Modified Rankin (Stroke Patients Only)       Balance Overall balance assessment: Needs assistance Sitting-balance support: No upper extremity supported, Feet supported Sitting balance-Leahy Scale: Good Sitting balance - Comments: no LOB   Standing balance support: During functional activity, Reliant on assistive device for balance, Single extremity supported, Bilateral upper extremity supported Standing balance-Leahy Scale: Fair Standing balance comment: can stand statically without support                             Pertinent Vitals/Pain Pain  Assessment Pain Assessment: No/denies pain    Home Living Family/patient expects to be discharged to:: Private residence Living Arrangements: Spouse/significant other Available Help at Discharge: Family;Available 24 hours/day Type of Home: House Home Access: Level entry (pt reports no step through garage)   Entrance Stairs-Number of Steps:  1   Home Layout: Two level;Able to live on main level with bedroom/bathroom Home Equipment: None      Prior Function Prior Level of Function : Independent/Modified Independent             Mobility Comments: independent with mobility prior to 12/20/22; on inpt rehab was CGA walking 200 ft and on stairs ADLs Comments: independent with ADL;s     Extremity/Trunk Assessment   Upper Extremity Assessment Upper Extremity Assessment: Defer to OT evaluation    Lower Extremity Assessment Lower Extremity Assessment: Generalized weakness    Cervical / Trunk Assessment Cervical / Trunk Assessment: Kyphotic  Communication   Communication Communication: No apparent difficulties  Cognition Arousal: Alert Behavior During Therapy: Flat affect Overall Cognitive Status: Within Functional Limits for tasks assessed                                          General Comments General comments (skin integrity, edema, etc.): see vitals flowsheet for orthostatic BPs and HR    Exercises     Assessment/Plan    PT Assessment Patient needs continued PT services  PT Problem List Decreased activity tolerance;Decreased mobility;Decreased balance       PT Treatment Interventions Gait training;Functional mobility training;Therapeutic activities;Balance training;Patient/family education;Therapeutic exercise    PT Goals (Current goals can be found in the Care Plan section)  Acute Rehab PT Goals Patient Stated Goal: stop feeling dizzy and return home (not to rehab) PT Goal Formulation: With patient Time For Goal Achievement: 02/13/23 Potential to Achieve Goals: Good    Frequency Min 1X/week     Co-evaluation               AM-PAC PT "6 Clicks" Mobility  Outcome Measure Help needed turning from your back to your side while in a flat bed without using bedrails?: None Help needed moving from lying on your back to sitting on the side of a flat bed without using bedrails?:  None Help needed moving to and from a bed to a chair (including a wheelchair)?: A Little Help needed standing up from a chair using your arms (e.g., wheelchair or bedside chair)?: A Little Help needed to walk in hospital room?: Total Help needed climbing 3-5 steps with a railing? : Total 6 Click Score: 16    End of Session   Activity Tolerance: Treatment limited secondary to medical complications (Comment) (dizziness; orthostasis) Patient left: in bed;with call bell/phone within reach;with family/visitor present Nurse Communication: Mobility status;Other (comment) (orthostasis) PT Visit Diagnosis: Other abnormalities of gait and mobility (R26.89)    Time: 1308-6578 PT Time Calculation (min) (ACUTE ONLY): 20 min   Charges:   PT Evaluation $PT Eval Low Complexity: 1 Low   PT General Charges $$ ACUTE PT VISIT: 1 Visit          Jerolyn Center, PT Acute Rehabilitation Services  Office [phone]   Zena Amos 01/30/2023, 10:52 AM

## 2023-01-30 NOTE — Evaluation (Signed)
Occupational Therapy Evaluation Patient Details Name: [name] [name] MRN: [id number] DOB: Jul 06, 1955 Today's Date: 01/30/2023   History of Present Illness 67 y.o. male was in inpatient rehab who comes into the hospital 01/29/23 with abdominal pain and ?SBO. 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 Recent RLE DVT with IVC placed. PMHx:testicular CA, HTN, ankle and patellar fx's, retinal detachment, intestinal stromal tumor 2022,   Clinical Impression   Pt reports independence at baseline before prior hospital admission, and states that he had "one good day" at rehab where he was min-CGA for ADLs, reports is ind with mobility. Pt lives with spouse. Currently needs set up - max A for ADLs, CGA for bed mobility. Pt able to sit EOB x1 min before needing to return to supine secondary to dizziness/orthostatic (See below). RN in room at end of session with compression stockings, total A to don. Pt declines second attempt OOB with stockings donned at this time. Pt presenting with impairments listed below, will follow acutely. Recommend HHOT at d/c pending progression.  BP supine 160/80 (110)  BP seated EOB 133/75 (92) BP supine after sitting EOB x1 min 154/79 (100)       If plan is discharge home, recommend the following: A little help with walking and/or transfers;A lot of help with bathing/dressing/bathroom;Assistance with cooking/housework;Assist for transportation;Help with stairs or ramp for entrance    Functional Status Assessment  Patient has had a recent decline in their functional status and demonstrates the ability to make significant improvements in function in a reasonable and predictable amount of time.  Equipment Recommendations  BSC/3in1;Tub/shower seat    Recommendations for Other Services PT consult     Precautions / Restrictions Precautions Precautions: Fall;Other (comment) Precaution  Comments: othostasis (has TED hose); abdominal incision Restrictions Weight Bearing Restrictions: No      Mobility Bed Mobility Overal bed mobility: Needs Assistance Bed Mobility: Rolling, Sidelying to Sit, Sit to Sidelying   Sidelying to sit: Contact guard assist     Sit to sidelying: Contact guard assist General bed mobility comments: utilizes log roll technique    Transfers                   General transfer comment: pt declines 2/2 dizziness/orthostatics      Balance Overall balance assessment: Needs assistance Sitting-balance support: No upper extremity supported, Feet supported Sitting balance-Leahy Scale: Good                                     ADL either performed or assessed with clinical judgement   ADL Overall ADL's : Needs assistance/impaired Eating/Feeding: NPO   Grooming: Set up;Sitting   Upper Body Bathing: Minimal assistance;Sitting   Lower Body Bathing: Moderate assistance;Maximal assistance   Upper Body Dressing : Minimal assistance   Lower Body Dressing: Maximal assistance;Moderate assistance   Toilet Transfer: Contact guard assist           Functional mobility during ADLs: Contact guard assist       Vision   Vision Assessment?: No apparent visual deficits     Perception Perception: Not tested       Praxis Praxis: Not tested       Pertinent Vitals/Pain Pain Assessment Pain Assessment: No/denies pain     Extremity/Trunk Assessment Upper Extremity Assessment Upper Extremity Assessment: Generalized weakness   Lower Extremity Assessment Lower Extremity Assessment:  Defer to PT evaluation   Cervical / Trunk Assessment Cervical / Trunk Assessment: Kyphotic   Communication Communication Communication: No apparent difficulties   Cognition Arousal: Alert Behavior During Therapy: Flat affect Overall Cognitive Status: Within Functional Limits for tasks assessed                                        General Comments  see note for BP measures    Exercises     Shoulder Instructions      Home Living Family/patient expects to be discharged to:: Private residence Living Arrangements: Spouse/significant other Available Help at Discharge: Family;Available 24 hours/day Type of Home: House Home Access: Level entry     Home Layout: Two level;Able to live on main level with bedroom/bathroom     Bathroom Shower/Tub: Walk-in shower   Bathroom Toilet: Handicapped height     Home Equipment: Agricultural consultant (2 wheels)          Prior Functioning/Environment Prior Level of Function : Independent/Modified Independent             Mobility Comments: independent with mobility prior to 12/20/22; on inpt rehab was CGA walking 200 ft and on stairs ADLs Comments: independent with ADL;s        OT Problem List: Decreased strength;Decreased range of motion;Decreased activity tolerance;Impaired balance (sitting and/or standing);Pain;Cardiopulmonary status limiting activity      OT Treatment/Interventions: Self-care/ADL training;Therapeutic activities;Therapeutic exercise;Patient/family education;Energy conservation;Balance training;DME and/or AE instruction    OT Goals(Current goals can be found in the care plan section) Acute Rehab OT Goals Patient Stated Goal: none stated OT Goal Formulation: With patient Time For Goal Achievement: 02/13/23 Potential to Achieve Goals: Good ADL Goals Pt Will Perform Upper Body Dressing: with supervision;standing Pt Will Perform Lower Body Dressing: with supervision;sitting/lateral leans;sit to/from stand Pt Will Transfer to Toilet: with supervision;ambulating;regular height toilet Pt Will Perform Tub/Shower Transfer: Shower transfer;Tub transfer;with supervision;ambulating  OT Frequency: Min 1X/week    Co-evaluation              AM-PAC OT "6 Clicks" Daily Activity     Outcome Measure Help from another person eating meals?:  None (has mobility but is NPO) Help from another person taking care of personal grooming?: A Little Help from another person toileting, which includes using toliet, bedpan, or urinal?: A Lot Help from another person bathing (including washing, rinsing, drying)?: A Lot Help from another person to put on and taking off regular upper body clothing?: A Little Help from another person to put on and taking off regular lower body clothing?: A Lot 6 Click Score: 16   End of Session Nurse Communication: Mobility status  Activity Tolerance: Patient limited by fatigue Patient left: in bed;with call bell/phone within reach;with bed alarm set;with family/visitor present  OT Visit Diagnosis: Unsteadiness on feet (R26.81);Muscle weakness (generalized) (M62.81)                Time: 0175-1025 OT Time Calculation (min): 22 min Charges:  OT General Charges $OT Visit: 1 Visit OT Evaluation $OT Eval Moderate Complexity: 1 Mod  Yianni Skilling K, OTD, OTR/L SecureChat Preferred Acute Rehab [phone]   [name] 01/30/2023, 2:57 PM

## 2023-01-30 NOTE — Progress Notes (Signed)
[facility]  Subjective  -no abdominal pain Objective (!) 142/96 (!) 103 99.2 F (37.3 C) (Oral) 18 97%  Abdominal exam - soft, no peritonitis.   Laboratory Lab Results: Recent Labs    01/29/23 0548 01/30/23 0340  WBC 12.1* 20.9*  HGB 10.8* 10.9*  HCT 32.6* 32.2*  PLT 158 171   BMET Recent Labs    01/29/23 0548 01/30/23 0340  NA 137 138  K 3.9 4.0  CL 102 103  CO2 22 22  GLUCOSE 131* 146*  BUN 29* 33*  CREATININE 2.05* 2.08*  CALCIUM 8.9 9.0    COAG Lab Results  Component Value Date   INR 1.2 01/30/2023   INR 1.7 (H) 12/29/2022   No results found for: "PTT"  Assessment/Planning:  Patient is a 67 year old male with previous history of SMA stenting for acute on chronic mesenteric ischemia. He has been unable to tolerate antiplatelet therapy or anticoagulation due to ischemic proctitis. He understands this is a very poor prognosis as stent patency is tied to antiplatelet therapy. Patient with new DVT, now status post IVC filter.  Margarette Asal had 10 out of 10 abdominal pain.  Fortunately this has resolved.  I am skeptical however due to the fact that he has a leukocytosis of 20.  I had a long conversation with [name] and his wife regarding his intra-abdominal pathology, namely the 3.5 mm stent and the inability to tolerate antiplatelet agents.  I am still concerned that the stent is occluded, however I do not think that scanning at this point would provide meaningful insight.  And if the stent is open, would threaten his kidneys again.  Regardless, I asked for [name] and his wife to assume that the stent is occluded, and discuss what they would want moving forward should he decompensate.  We discussed do everything-OR open assess bowel versus palliative care. If the stent is occluded, I do not think he will have enough bowel to live.  I think he would be best served with palliative care discussions.  I have discussed the above with  general surgery.  Will continue to follow closely.  Victorino Sparrow 01/30/2023 10:23 AM --

## 2023-01-30 NOTE — Plan of Care (Signed)
  Problem: Education: Goal: Knowledge of General Education information will improve Description: Including pain rating scale, medication(s)/side effects and non-pharmacologic comfort measures Outcome: Progressing   Problem: Clinical Measurements: Goal: Will remain free from infection Outcome: Progressing Goal: Cardiovascular complication will be avoided Outcome: Progressing   Problem: Activity: Goal: Risk for activity intolerance will decrease Outcome: Progressing   Problem: Coping: Goal: Level of anxiety will decrease Outcome: Progressing   Problem: Elimination: Goal: Will not experience complications related to bowel motility Outcome: Progressing   Problem: Pain Managment: Goal: General experience of comfort will improve Outcome: Progressing   Problem: Safety: Goal: Ability to remain free from injury will improve Outcome: Progressing   Problem: Activity: Goal: Ability to tolerate increased activity will improve Outcome: Progressing

## 2023-01-30 NOTE — Progress Notes (Signed)
Occupational Therapy Note  Patient Details  Name: [name] [name] MRN: [id number] Date of Birth: 10-Oct-1955  Occupational Therapy Discharge Note  This patient was unable to complete the inpatient rehab program due to increased abdominal pain/discomfort; therefore did not meet their long term goals. Pt left the program at a Min A assist level for their functional ADLs. This patient is being discharged from OT services at this time.  BIMS at time of d/c  Pt unable to complete due to medical status  See CareTool for functional status details.  If the patient is able to return to inpatient rehabilitation within 3 midnights, this may be considered an interrupted stay and therapy services will resume as ordered. Modification and reinstatement of their goals will be made upon completion of therapy service reevaluations.    Isabella Stalling 01/30/2023, 12:33 PM

## 2023-01-30 NOTE — Progress Notes (Addendum)
PROGRESS NOTE                                                                                                                                                                                                             Patient Demographics:    [name] [name], is a 67 y.o. male, DOB - [birth year], WGN:[id number]  Outpatient Primary MD for the patient is [name], MD    LOS - 1  Admit date - 01/29/2023    No chief complaint on file.      Brief Narrative (HPI from H&P)   67 y.o. male with medical history significant of HTN, HLD, who was in inpatient rehab who comes into the hospital with abdominal pain.  He recently had a prolonged and complicated hospitalization between 12/20/2022-01/24/2023.  For full hospitalization course please refer to the DC summary on 01/24/2023 by Dr. Lowell Guitar.  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  Vascular surgery was consulted and he was found to have an acute occlusion of the distal SMA as well as the inferior mesenteric artery.  General surgery was also consulted, and he was taken to the OR emergently, underwent an SMA stent and had resected the portion of his jejunum due to necrosis.  Hospital course complicated by multiple OR trips to check on bowel integrity with additional resections and reanastomoses x 2.  He also developed renal failure and had to be placed on dialysis.    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  Upon stability he was discharged to inpatient rehab, he was doing well up until yesterday when he felt sharp abdominal  pain, along with nausea and vomiting.    He was found to have concerns for small bowel obstruction, was seen by general surgery this morning and felt like he would benefit from being moved back to the acute care side.  Of note, while in rehab, he developed a DVT and underwent IVC filter placement.    Subjective:    [name] [name] today has, No headache, No  chest pain, No abdominal pain - No Nausea, No new weakness tingling or numbness, no shortness of breath, now passing some flatus.   Assessment  & Plan :    Concern for small bowel obstruction -CT scan done in rehab this morning showed small bowel dilatation of several loops in the left mid abdomen, concerning for slow transition date with possible early or partial small bowel obstruction.  With supportive care and bowel rest symptoms improved, passing flatus on 01/30/2023, pain-free nausea free.  Repeat KUB on 01/30/2023, further per surgery guidance.   Recent SMA occlusion, mesenteric ischemia, small bowel infarction-vascular surgery continued to follow while in rehab and saw patient this morning.  There is concern with his abdominal pain that the stent is occluded.  Complicating factor is the patient's inability to tolerate antiplatelet medications.  If the stent is occluded this has high mortality and Pall care is recommended, DW Dr. Sherral Hammers, Dr. Sherral Hammers also discussed this on 01/30/2023 with the patient and his wife.  Pain has improved continue to monitor with supportive care.  Have leukocytosis but currently fever free.  Monitor.   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  Placed on SCDs   Acute DVT -this was diagnosed on 10/17 while in rehab, lower extremity Doppler showed acute DVT of the right popliteal vein, right peroneal vein,  left peroneal vein, left soleal vein, and left gastrocnemius vein.  Vascular surgery placed an IVC filter on 10/18.  Could not tolerate anticoagulation due to recent GI bleed.   Acute kidney injury on CKD  3b  - baseline Cr ~ 2.0, follows with outpatient nephrology. He has required temporary HD, last hemodialysis session was 10/18.  Nephrology followed, saw patient this morning in rehab and actually signed off.  Continue to watch renal function, rate and monitor bladder scans.   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 as per general surgery, hydralazine and IV labetalol PRN   Hypokalemia, hypomagnesemia-replaced, monitor   Recent GI bleed -requiring transfusion.  GI consulted and underwent EGD and colonoscopy on 10/2, colonoscopy showed severe mucosal ulceration in the rectum and rectosigmoid colon, felt to be ischemic   BPH-hold tamsulosin.  Bladder scan every shift, resume Flomax once taking oral.   PAF with RVR -while hospitalized last time, hold amiodarone for now while strictly n.p.o. monitor on telemetry.  Most recent 2D echo done April 2024 showed normal LVEF, grade 1 diastolic dysfunction.  Of note intolerant to anticoagulation due to rectal bleeding recently.   Anemia of chronic illness-hemoglobin stable   Leukocytosis-noted.  Afebrile.  Will follow.   Left elbow dermatitis present for at least a month and a half.  Steroid cream and monitor.      Condition - fair  Family Communication  : Wife bedside 01/30/2023  Code Status : Full code  Consults  : General Surgery  PUD Prophylaxis : PPI   Procedures  :     CT - 1. Limited evaluation on this noncontrast study. 2. Similar-appearing dilatation of several loops of the small bowel within the left mid abdomen in the setting of small bowel resection surgical changes. These loops of small bowel no longer demonstrate bowel wall thickening but instead fecalized material within the lumen. Findings suggestive of a slow transition state with possible early or partial small bowel obstruction. No pneumatosis. No bowel wall perforation. Consider small-bowel follow-through radiographs  series. 3. Under-distended rectum with persistent trace perirectal fat stranding. Recommend attention on follow-up.  4. Colonic diverticulosis with no acute diverticulitis. 5. Inferior vena cava filter in grossly appropriate position. 6.  Aortic Atherosclerosis (ICD10-I70.0)- severe. 7. Bilateral trace pleural effusions      Disposition Plan  :    Status is: Inpatient   DVT Prophylaxis  :    SCDs Start: 01/29/23 1631    Lab Results  Component Value Date   PLT 171 01/30/2023    Diet :  Diet Order             Diet NPO time specified  Diet effective now                    Inpatient Medications  Scheduled Meds:  Chlorhexidine Gluconate Cloth  6 each Topical Daily   pantoprazole (PROTONIX) IV  40 mg Intravenous Q24H   Continuous Infusions:  dextrose 5 % and 0.45 % NaCl 50 mL/hr at 01/30/23 0625   PRN Meds:.acetaminophen **OR** acetaminophen, hydrALAZINE, HYDROmorphone (DILAUDID) injection, labetalol, ondansetron **OR** ondansetron (ZOFRAN) IV  Antibiotics  :    Anti-infectives (From admission, onward)    None         Objective:   Vitals:   01/29/23 2101 01/29/23 2330 01/30/23 0410 01/30/23 0800  BP:  (!) 172/86 (!) 173/79 (!) 142/96  Pulse:  (!) 102 (!) 105 (!) 103  Resp: 17 20 20 18   Temp:   98.7 F (37.1 C) 99.2 F (37.3 C)  TempSrc:   Oral Oral  SpO2:   96% 97%    Wt Readings from Last 3 Encounters:  01/29/23 104.6 kg  01/24/23 96.5 kg  12/20/22 95.7 kg     Intake/Output Summary (Last 24 hours) at 01/30/2023 1029 Last data filed at 01/30/2023 0735 Gross per 24 hour  Intake 98.22 ml  Output 850 ml  Net -751.78 ml     Physical Exam  Awake Alert, No new F.N deficits, Normal affect [place].AT,PERRAL Supple Neck, No JVD,   Symmetrical Chest wall movement, Good air movement bilaterally, CTAB RRR,No Gallops,Rubs or new Murmurs,  Hypoactive but +ve B.Sounds, Abd Soft, No tenderness,   Left elbow side dermatitis noted present for the last  month and a half according to family    RN pressure injury documentation: Pressure Injury 01/04/23 Buttocks Right;Upper Unstageable - Full thickness tissue loss in which the base of the injury is covered by slough (yellow, tan, gray, green or brown) and/or eschar (tan, brown or black) in the wound bed. (Active)  01/04/23 2100  Location: Buttocks  Location Orientation: Right;Upper  Staging: Unstageable - Full thickness tissue loss in which the base of the injury is covered by slough (yellow, tan, gray, green or brown) and/or eschar (tan, brown or black) in the wound bed.  Wound Description (Comments):   Present on Admission: No  Dressing Type Foam - Lift dressing to assess site every shift 01/29/23 1700      Data Review:    Recent Labs  Lab 01/25/23 0459 01/27/23 0735 01/28/23 1951 01/29/23 0548 01/30/23 0340  WBC 6.1 6.0 7.1 12.1* 20.9*  HGB 9.5* 10.5* 9.8* 10.8* 10.9*  HCT 28.6* 31.7* 29.6* 32.6* 32.2*  PLT 163 164 148* 158 171  MCV 88.0 89.0 87.8 89.6 89.7  MCH 29.2 29.5 29.1 29.7 30.4  MCHC 33.2 33.1 33.1 33.1 33.9  RDW 14.7 14.6 14.5 14.4 14.8  LYMPHSABS 0.6*  --  0.2*  --   --   MONOABS 0.5  --  0.5  --   --   EOSABS  0.2  --  0.0  --   --   BASOSABS 0.0  --  0.0  --   --     Recent Labs  Lab 01/25/23 0459 01/27/23 0735 01/28/23 0621 01/28/23 1951 01/28/23 2037 01/29/23 0548 01/30/23 0340 01/30/23 0744  NA 139 139 136 134*  --  137 138  --   K 3.3* 3.6 3.3* 3.1*  --  3.9 4.0  --   CL 103 105 101 101  --  102 103  --   CO2 24 23 24 23   --  22 22  --   ANIONGAP 12 11 11 10   --  13 13  --   GLUCOSE 111* 119* 118* 174*  --  131* 146*  --   BUN 32* 42* 34* 33*  --  29* 33*  --   CREATININE 2.37* 2.43* 2.31* 2.28*  --  2.05* 2.08*  --   AST 27  --   --   --   --   --  21  --   ALT 43  --   --   --   --   --  32  --   ALKPHOS 62  --   --   --   --   --  77  --   BILITOT 0.5  --   --   --   --   --  1.0  --   ALBUMIN 1.8*  --  2.0*  --   --  2.4* 2.1*  --    LATICACIDVEN  --   --   --  0.9 0.8  --   --   --   INR  --   --   --   --   --   --   --  1.2  BNP  --   --   --   --   --   --  253.6*  --   MG 1.3* 1.5*  --   --   --  1.4* 2.1  --   CALCIUM 8.4* 7.8* 7.8* 7.8*  --  8.9 9.0  --       Recent Labs  Lab 01/25/23 0459 01/27/23 0735 01/28/23 0621 01/28/23 1951 01/28/23 2037 01/29/23 0548 01/30/23 0340 01/30/23 0744  LATICACIDVEN  --   --   --  0.9 0.8  --   --   --   INR  --   --   --   --   --   --   --  1.2  BNP  --   --   --   --   --   --  253.6*  --   MG 1.3* 1.5*  --   --   --  1.4* 2.1  --   CALCIUM 8.4* 7.8* 7.8* 7.8*  --  8.9 9.0  --     --------------------------------------------------------------------------------------------------------------- Lab Results  Component Value Date   CHOL 96 01/27/2023   HDL 29 (L) 01/27/2023   LDLCALC 52 01/27/2023   LDLDIRECT 86.8 09/27/2012   TRIG 75 01/27/2023   CHOLHDL 3.3 01/27/2023    Lab Results  Component Value Date   HGBA1C 6.3 (H) 12/29/2022   No results for input(s): "TSH", "T4TOTAL", "FREET4", "T3FREE", "THYROIDAB" in the last 72 hours. No results for input(s): "VITAMINB12", "FOLATE", "FERRITIN", "TIBC", "IRON", "RETICCTPCT" in the last 72 hours. ------------------------------------------------------------------------------------------------------------------ Cardiac Enzymes No results for input(s): "CKMB", "TROPONINI", "MYOGLOBIN" in the last 168 hours.  Invalid input(s): "  CK"  Micro Results No results found for this or any previous visit (from the past 240 hour(s)).  Radiology Reports CT ABDOMEN PELVIS WO CONTRAST  Result Date: 01/29/2023 CLINICAL DATA:  Abdominal pain, acute, nonlocalized SMA thrombectomy and angioplasty with a stent to the distal SMA w/ vascular and ex-lap, EXAM: CT ABDOMEN AND PELVIS WITHOUT CONTRAST TECHNIQUE: Multidetector CT imaging of the abdomen and pelvis was performed following the standard protocol without IV contrast. RADIATION  DOSE REDUCTION: This exam was performed according to the departmental dose-optimization program which includes automated exposure control, adjustment of the mA and/or kV according to patient size and/or use of iterative reconstruction technique. COMPARISON:  CT abdomen pelvis 12/21/2022 chole CTA GI bleed 12/29/2022 FINDINGS: Lower chest: Bilateral trace pleural effusions. Hepatobiliary: No focal liver abnormality. No gallstones, gallbladder wall thickening, or pericholecystic fluid. No biliary dilatation. Pancreas: No focal lesion. Normal pancreatic contour. No surrounding inflammatory changes. No main pancreatic ductal dilatation. Spleen: Normal in size without focal abnormality. Adrenals/Urinary Tract: No adrenal nodule bilaterally. No nephrolithiasis and no hydronephrosis. No definite contour-deforming renal mass. No ureterolithiasis or hydroureter. The urinary bladder is unremarkable. Stomach/Bowel: Small bowel resection surgical changes noted. Stomach is within normal limits. Similar-appearing dilatation of several loops of the small bowel within the left mid abdomen in the setting of small bowel resection surgical changes. These loops of small bowel no longer demonstrate bowel wall thickening but instead fecalized material within the lumen. Colonic diverticulosis. Under distended rectum with persistent trace perirectal fat stranding. Appendix appears normal. Vascular/Lymphatic: No portal venous or mesenteric venous gas. Inferior vena cava filter in grossly appropriate position. SMA stent. No abdominal aorta or iliac aneurysm. Severe atherosclerotic plaque of the aorta and its branches. No abdominal, pelvic, or inguinal lymphadenopathy. Reproductive: Prostate is unremarkable. Other: No intraperitoneal free fluid. No intraperitoneal free gas. No organized fluid collection. Musculoskeletal: No abdominal wall hernia or abnormality. No suspicious lytic or blastic osseous lesions. No acute displaced fracture.  IMPRESSION: 1. Limited evaluation on this noncontrast study. 2. Similar-appearing dilatation of several loops of the small bowel within the left mid abdomen in the setting of small bowel resection surgical changes. These loops of small bowel no longer demonstrate bowel wall thickening but instead fecalized material within the lumen. Findings suggestive of a slow transition state with possible early or partial small bowel obstruction. No pneumatosis. No bowel wall perforation. Consider small-bowel follow-through radiographs series. 3. Under-distended rectum with persistent trace perirectal fat stranding. Recommend attention on follow-up. 4. Colonic diverticulosis with no acute diverticulitis. 5. Inferior vena cava filter in grossly appropriate position. 6.  Aortic Atherosclerosis (ICD10-I70.0)- severe. 7. Bilateral trace pleural effusions. Electronically Signed   By: Tish Frederickson M.D.   On: 01/29/2023 01:12   DG Abd 2 Views  Result Date: 01/28/2023 CLINICAL DATA:  Diarrhea EXAM: ABDOMEN - 2 VIEW COMPARISON:  12/28/2022 FINDINGS: The bowel gas pattern is normal. There is no evidence of free air. IVC filter in place. No radio-opaque calculi or other significant radiographic abnormality is seen. Vascular calcifications. IMPRESSION: Negative. Electronically Signed   By: Duanne Guess D.O.   On: 01/28/2023 18:56   PERIPHERAL VASCULAR CATHETERIZATION  Result Date: 01/26/2023 DATE OF SERVICE: 01/26/2023  PATIENT:  Currie Paris Galgano  67 y.o. male  PRE-OPERATIVE DIAGNOSIS:  DVT, contraindication to anticoagulation  POST-OPERATIVE DIAGNOSIS:  Same  PROCEDURE:  Placement of inferior vena cava filter Conscious sedation (21 minutes)  SURGEON:  Surgeons and Role:    * Leonie Douglas, MD - Primary  ASSISTANT: none  ANESTHESIA:   local and IV sedation  EBL: minimal  BLOOD ADMINISTERED:none  DRAINS: none  LOCAL MEDICATIONS USED:  LIDOCAINE  SPECIMEN:  none  COUNTS: confirmed correct.  TOURNIQUET:  none  PATIENT  DISPOSITION:  PACU - hemodynamically stable.  Delay start of Pharmacological VTE agent (>24hrs) due to surgical blood loss or risk of bleeding: no  INDICATION FOR PROCEDURE: CHARLY PARTIDA is a 67 y.o. male with DVT and contraindication to anticoagulation. After careful discussion of risks, benefits, and alternatives the patient was offered IVC filter. The patient understood and wished to proceed.  OPERATIVE FINDINGS: successful deployment of IVCF  DESCRIPTION OF PROCEDURE: After identification of the patient in the pre-operative holding area, the patient was transferred to the operating room. The patient was positioned supine on the operating room table. Anesthesia was induced. The groins were prepped and draped in standard fashion. A surgical pause was performed confirming correct patient, procedure, and operative location.  Using ultrasound guidance, the right common femoral vein was accessed with micropuncture technique.  Using Seldinger technique, a micro sheath was introduced into the common femoral vein.  A Bentson wire was navigated into the inferior vena cava.  The tract was dilated.  A Cook select system was prepared per [employer] instructions and brought onto the field.  The sheath was delivered into the infrarenal IVC.  Central venogram was performed and the renal anatomy was marked.  An IVC filter was then deployed well below the renal veins.  A follow-up venogram confirmed good position.  All endovascular equipment was removed.  Manual pressure was held on the groin.  Hemostasis was achieved.  A bandage was applied.  Upon completion of the case instrument and sharps counts were confirmed correct. The patient was transferred to the PACU in good condition. I was present for all portions of the procedure.  FOLLOW UP PLAN: Assuming a normal postoperative course, we will remove the IVC filter as soon as he can be anticoagulated safely.  [name]. Lenell Antu, MD [facility] Phone Number: [phone] 01/26/2023 2:26 PM        Signature  -   Susa Raring M.D on 01/30/2023 at 10:29 AM   -  To page go to [email or web address]

## 2023-01-30 NOTE — Progress Notes (Addendum)
Initial Nutrition Assessment  DOCUMENTATION CODES:   Severe malnutrition in context of chronic illness, Obesity unspecified  INTERVENTION:  Advance diet as medically applicable. Provide carnation instant breakfast when diet advances. BID   NUTRITION DIAGNOSIS:   Severe Malnutrition related to chronic illness as evidenced by moderate fat depletion, mild muscle depletion, energy intake < or equal to 50% for > or equal to 1 month.    GOAL:   Patient will meet greater than or equal to 90% of their needs    MONITOR:   Diet advancement, Weight trends  REASON FOR ASSESSMENT:   Malnutrition Screening Tool    ASSESSMENT:   67 y.o. M admitted for small bowel obstruction. Mhx; HTN, HLD,mesenteric artery stenosis, GIST of small bowel with removal of tumor in 2022, CKD 3, GERD, hx testicular cancer  and recently had a prolonged and complicated hospitalization between 12/20/2022-01/24/2023. Currently NPO  Patient went to rehab and was sent back due to SBO. When in hospital RD met with patients wife discussed nutrient dense meals. Patient stated that his appetite was good when in rehab. Dislikes all supplements. Wife asked about beneprotein informed her that it was fine for them to mix it with water or drink of choice.   Admit weight: 104.6 kg Current weight: 104.6 kg  Weight history; 01/29/23 104.6 kg  01/24/23 96.5 kg  12/20/22 95.7 kg  12/04/22 103.4 kg  11/27/22 108 kg  11/20/22 108 kg  10/31/22 108 kg  10/30/22 106.6 kg  09/12/22 109.3 kg  09/11/22 109.3 kg   Average Meal Intake: NPO  Nutritionally Relevant Medications:  Continuous Infusions:  dextrose 5 % and 0.45 % NaCl 50 mL/hr at 01/30/23 0625   PRN Meds:.acetaminophen **OR** acetaminophen, hydrALAZINE, HYDROmorphone (DILAUDID) injection, labetalol, ondansetron **OR** ondansetron (ZOFRAN) IV  Labs Reviewed:  CBG ranges from 131-146 mg/dL over the last 24 hours HgbA1c 6.3    NUTRITION - FOCUSED PHYSICAL  EXAM:  Flowsheet Row Most Recent Value  Orbital Region Mild depletion  Upper Arm Region Moderate depletion  Thoracic and Lumbar Region Mild depletion  Buccal Region Moderate depletion  Temple Region Moderate depletion  Clavicle Bone Region Mild depletion  Clavicle and Acromion Bone Region Mild depletion  Scapular Bone Region Unable to assess  Dorsal Hand Moderate depletion  Patellar Region Mild depletion  Anterior Thigh Region Mild depletion  Posterior Calf Region Mild depletion  Edema (RD Assessment) Mild  Hair Reviewed  Eyes Reviewed  Mouth Reviewed  Skin Reviewed  Nails Reviewed       Diet Order:   Diet Order             Diet NPO time specified  Diet effective now                   EDUCATION NEEDS:   Not appropriate for education at this time  Skin:  Skin Assessment: Reviewed RN Assessment  Last BM:  PTA  Height:   Ht Readings from Last 1 Encounters:  12/20/22 5\' 11"  (1.803 m)    Weight:   Wt Readings from Last 1 Encounters:  01/29/23 104.6 kg    Ideal Body Weight:     BMI:  There is no height or weight on file to calculate BMI.  Estimated Nutritional Needs:   Kcal:  2300-2500 kcal  Protein:  140-150 g  Fluid:  >/= 2.3L    Jamelle Haring RDN, LDN Clinical Dietitian  RDN pager # available on Amion

## 2023-01-31 ENCOUNTER — Inpatient Hospital Stay (HOSPITAL_COMMUNITY): Payer: Medicare Other

## 2023-01-31 DIAGNOSIS — M7989 Other specified soft tissue disorders: Secondary | ICD-10-CM

## 2023-01-31 DIAGNOSIS — K56609 Unspecified intestinal obstruction, unspecified as to partial versus complete obstruction: Secondary | ICD-10-CM | POA: Diagnosis not present

## 2023-01-31 LAB — CBC WITH DIFFERENTIAL/PLATELET
Abs Immature Granulocytes: 0.15 10*3/uL — ABNORMAL HIGH (ref 0.00–0.07)
Basophils Absolute: 0 10*3/uL (ref 0.0–0.1)
Basophils Relative: 0 %
Eosinophils Absolute: 0 10*3/uL (ref 0.0–0.5)
Eosinophils Relative: 0 %
HCT: 29.5 % — ABNORMAL LOW (ref 39.0–52.0)
Hemoglobin: 9.6 g/dL — ABNORMAL LOW (ref 13.0–17.0)
Immature Granulocytes: 1 %
Lymphocytes Relative: 2 %
Lymphs Abs: 0.3 10*3/uL — ABNORMAL LOW (ref 0.7–4.0)
MCH: 29.1 pg (ref 26.0–34.0)
MCHC: 32.5 g/dL (ref 30.0–36.0)
MCV: 89.4 fL (ref 80.0–100.0)
Monocytes Absolute: 1.1 10*3/uL — ABNORMAL HIGH (ref 0.1–1.0)
Monocytes Relative: 6 %
Neutro Abs: 16.1 10*3/uL — ABNORMAL HIGH (ref 1.7–7.7)
Neutrophils Relative %: 91 %
Platelets: 178 10*3/uL (ref 150–400)
RBC: 3.3 MIL/uL — ABNORMAL LOW (ref 4.22–5.81)
RDW: 14.6 % (ref 11.5–15.5)
WBC: 17.7 10*3/uL — ABNORMAL HIGH (ref 4.0–10.5)
nRBC: 0 % (ref 0.0–0.2)

## 2023-01-31 LAB — BASIC METABOLIC PANEL
Anion gap: 10 (ref 5–15)
BUN: 39 mg/dL — ABNORMAL HIGH (ref 8–23)
CO2: 21 mmol/L — ABNORMAL LOW (ref 22–32)
Calcium: 8.3 mg/dL — ABNORMAL LOW (ref 8.9–10.3)
Chloride: 108 mmol/L (ref 98–111)
Creatinine, Ser: 1.92 mg/dL — ABNORMAL HIGH (ref 0.61–1.24)
GFR, Estimated: 37 mL/min — ABNORMAL LOW (ref >60)
Glucose, Bld: 124 mg/dL — ABNORMAL HIGH (ref 70–99)
Potassium: 3.1 mmol/L — ABNORMAL LOW (ref 3.5–5.1)
Sodium: 139 mmol/L (ref 135–145)

## 2023-01-31 LAB — BRAIN NATRIURETIC PEPTIDE: B Natriuretic Peptide: 194.8 pg/mL — ABNORMAL HIGH (ref 0.0–100.0)

## 2023-01-31 LAB — MAGNESIUM: Magnesium: 1.9 mg/dL (ref 1.7–2.4)

## 2023-01-31 LAB — C-REACTIVE PROTEIN: CRP: 22.1 mg/dL — ABNORMAL HIGH (ref <1.0)

## 2023-01-31 LAB — PROCALCITONIN: Procalcitonin: 1.02 ng/mL

## 2023-01-31 LAB — PHOSPHORUS: Phosphorus: 4.5 mg/dL (ref 2.5–4.6)

## 2023-01-31 MED ORDER — DEXTROSE-SODIUM CHLORIDE 5-0.45 % IV SOLN: INTRAVENOUS | Status: DC

## 2023-01-31 MED ORDER — POTASSIUM CHLORIDE 10 MEQ/100ML IV SOLN
10.0000 meq | INTRAVENOUS | Status: AC
  Administered 2023-01-31 (×2): 10 meq via INTRAVENOUS
  Filled 2023-01-31 (×2): qty 100

## 2023-01-31 MED ORDER — TAMSULOSIN HCL 0.4 MG PO CAPS
0.4000 mg | ORAL_CAPSULE | Freq: Every day | ORAL | Status: DC
Start: 2023-01-31 — End: 2023-02-07
  Administered 2023-01-31 – 2023-02-05 (×6): 0.4 mg via ORAL
  Filled 2023-01-31 (×8): qty 1

## 2023-01-31 MED ORDER — PANTOPRAZOLE SODIUM 40 MG PO TBEC
40.0000 mg | DELAYED_RELEASE_TABLET | Freq: Every day | ORAL | Status: DC
  Administered 2023-02-01 – 2023-02-05 (×5): 40 mg via ORAL
  Filled 2023-01-31 (×7): qty 1

## 2023-01-31 MED ORDER — CARVEDILOL 3.125 MG PO TABS
3.1250 mg | ORAL_TABLET | Freq: Two times a day (BID) | ORAL | Status: DC
  Administered 2023-01-31 – 2023-02-03 (×7): 3.125 mg via ORAL
  Filled 2023-01-31 (×7): qty 1

## 2023-01-31 MED ORDER — POTASSIUM CHLORIDE 10 MEQ/100ML IV SOLN
10.0000 meq | INTRAVENOUS | Status: AC
  Administered 2023-01-31 (×4): 10 meq via INTRAVENOUS
  Filled 2023-01-31 (×4): qty 100

## 2023-01-31 MED ORDER — ASPIRIN 81 MG PO CHEW
81.0000 mg | CHEWABLE_TABLET | Freq: Every day | ORAL | Status: DC
  Administered 2023-01-31 – 2023-02-05 (×6): 81 mg via ORAL
  Filled 2023-01-31 (×8): qty 1

## 2023-01-31 NOTE — Progress Notes (Signed)
PROGRESS NOTE                                                                                                                                                                                                             Patient Demographics:    [name] [name], is a 67 y.o. male, DOB - [birth year], FAO:[id number]  Outpatient Primary MD for the patient is [name], MD    LOS - 2  Admit date - 01/29/2023    No chief complaint on file.      Brief Narrative (HPI from H&P)   67 y.o. male with medical history significant of HTN, HLD, who was in inpatient rehab who comes into the hospital with abdominal pain.  He recently had a prolonged and complicated hospitalization between 12/20/2022-01/24/2023.  For full hospitalization course please refer to the DC summary on 01/24/2023 by Dr. Lowell Guitar.  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  Vascular surgery was consulted and he was found to have an acute occlusion of the distal SMA as well as the inferior mesenteric artery.  General surgery was also consulted, and he was taken to the OR emergently, underwent an SMA stent and had resected the portion of his jejunum due to necrosis.  Hospital course complicated by multiple OR trips to check on bowel integrity with additional resections and reanastomoses x 2.  He also developed renal failure and had to be placed on dialysis.    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  Upon stability he was discharged to inpatient rehab, he was doing well up until yesterday when he felt sharp abdominal  pain, along with nausea and vomiting.    He was found to have concerns for small bowel obstruction, was seen by general surgery this morning and felt like he would benefit from being moved back to the acute care side.  Of note, while in rehab, he developed a DVT and underwent IVC filter placement.    Subjective:   Patient in bed, appears comfortable, denies any headache,  no fever, no chest pain or pressure, no shortness of breath , no abdominal pain, had multiple bowel movements last night, no nausea. No new focal weakness.    Assessment  & Plan :    Concern for small bowel obstruction -CT scan done in rehab this morning showed small bowel dilatation of several loops in the left mid abdomen, concerning for slow transition date with possible early or partial small bowel obstruction.  With supportive care and bowel rest symptoms improved, passing flatus and had multiple bowel movements night of 01/30/2023, pain-free nausea free.  Medically improving, further management per surgery guidance.   Recent SMA occlusion, mesenteric ischemia, small bowel infarction-vascular surgery continued to follow while in rehab and saw patient this morning.  There is concern with his abdominal pain that the stent is occluded.  Complicating factor is the patient's inability to tolerate antiplatelet medications.  If the stent is occluded this has high mortality and Pall care is recommended, DW Dr. Sherral Hammers, Dr. Sherral Hammers also discussed this on 01/30/2023 with the patient and his wife.  Pain has improved continue to monitor with supportive care.  Have leukocytosis but currently fever free.  Monitor.   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  Placed on SCDs   Acute DVT -this was diagnosed on 10/17 while in rehab, lower extremity Doppler showed acute DVT of the right popliteal vein, right peroneal vein,  left peroneal vein, left soleal vein, and left gastrocnemius vein.  Vascular surgery placed an IVC filter on 10/18.   Could not tolerate anticoagulation due to recent GI bleed.   Acute kidney injury on CKD 3b  - baseline Cr ~ 2.0, follows with outpatient nephrology. He has required temporary HD, last hemodialysis session was 10/18.  Nephrology followed, saw patient this morning in rehab and actually signed off.  Continue to watch renal function, rate and monitor bladder scans.   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 as per general surgery, hydralazine and IV labetalol PRN   Hypokalemia, hypomagnesemia-replaced, monitor   Recent GI bleed -requiring transfusion.  GI consulted and underwent EGD and colonoscopy on 10/2, colonoscopy showed severe mucosal ulceration in the rectum and rectosigmoid colon, felt to be ischemic   BPH-hold tamsulosin.  Bladder scan every shift, resume Flomax once taking oral.   PAF with RVR -while hospitalized last time, hold amiodarone for now while strictly n.p.o. monitor on telemetry.  Most recent 2D echo done April 2024 showed normal LVEF, grade 1 diastolic dysfunction.  Of note intolerant to anticoagulation due to rectal bleeding recently.   Anemia of chronic illness-hemoglobin stable   Leukocytosis-noted.  Afebrile.  Will follow.   Left elbow dermatitis present for at least a month and a half.  Steroid cream and monitor.      Condition - fair  Family Communication  : Wife bedside 01/30/2023  Code Status : Full code  Consults  : General Surgery, VVS,    PUD Prophylaxis : PPI   Procedures  :     CT - 1. Limited evaluation on this noncontrast study. 2. Similar-appearing dilatation of several loops of the small bowel within the left mid abdomen in the setting of small bowel resection surgical changes. These loops of small bowel no longer demonstrate bowel wall thickening but instead fecalized material within the lumen. Findings suggestive of a slow transition state with possible early or partial small bowel  obstruction. No pneumatosis. No bowel wall perforation. Consider small-bowel follow-through radiographs  series. 3. Under-distended rectum with persistent trace perirectal fat stranding. Recommend attention on follow-up. 4. Colonic diverticulosis with no acute diverticulitis. 5. Inferior vena cava filter in grossly appropriate position. 6.  Aortic Atherosclerosis (ICD10-I70.0)- severe. 7. Bilateral trace pleural effusions      Disposition Plan  :    Status is: Inpatient   DVT Prophylaxis  :    Place TED hose Start: 01/30/23 1037 SCDs Start: 01/29/23 1631    Lab Results  Component Value Date   PLT 178 01/31/2023    Diet :  Diet Order             Diet NPO time specified  Diet effective now                    Inpatient Medications  Scheduled Meds:  Chlorhexidine Gluconate Cloth  6 each Topical Daily   clobetasol cream   Topical BID   pantoprazole (PROTONIX) IV  40 mg Intravenous Daily   Continuous Infusions:  dextrose 5 % and 0.45 % NaCl 50 mL/hr at 01/31/23 0630   potassium chloride 10 mEq (01/31/23 0852)   potassium chloride     PRN Meds:.acetaminophen **OR** acetaminophen, hydrALAZINE, HYDROmorphone (DILAUDID) injection, labetalol, ondansetron **OR** ondansetron (ZOFRAN) IV  Antibiotics  :    Anti-infectives (From admission, onward)    None         Objective:   Vitals:   01/30/23 2306 01/31/23 0200 01/31/23 0400 01/31/23 0800  BP: (!) 159/86  (!) 158/80 (!) 179/86  Pulse: 92 91 98 93  Resp: 17 20 18 19   Temp: 98 F (36.7 C)  98.3 F (36.8 C) 98.2 F (36.8 C)  TempSrc: Oral  Oral Oral  SpO2: 94% 95% 97%     Wt Readings from Last 3 Encounters:  01/29/23 104.6 kg  01/24/23 96.5 kg  12/20/22 95.7 kg     Intake/Output Summary (Last 24 hours) at 01/31/2023 0905 Last data filed at 01/31/2023 0650 Gross per 24 hour  Intake 462.28 ml  Output 1090 ml  Net -627.72 ml     Physical Exam  Awake Alert, No new F.N deficits, Normal  affect [place].AT,PERRAL Supple Neck, No JVD,   Symmetrical Chest wall movement, Good air movement bilaterally, CTAB RRR,No Gallops,Rubs or new Murmurs,  Hypoactive but +ve B.Sounds, Abd Soft, No tenderness,   Left elbow side dermatitis noted present for the last month and a half according to family    RN pressure injury documentation: Pressure Injury 01/04/23 Buttocks Right;Upper Unstageable - Full thickness tissue loss in which the base of the injury is covered by slough (yellow, tan, gray, green or brown) and/or eschar (tan, brown or black) in the wound bed. (Active)  01/04/23 2100  Location: Buttocks  Location Orientation: Right;Upper  Staging: Unstageable - Full thickness tissue loss in which the base of the injury is covered by slough (yellow, tan, gray, green or brown) and/or eschar (tan, brown or black) in the wound bed.  Wound Description (Comments):   Present on Admission: No  Dressing Type Foam - Lift dressing to assess site every shift 01/31/23 0750      Data Review:    Recent Labs  Lab 01/25/23 0459 01/27/23 0735 01/28/23 1951 01/29/23 0548 01/30/23 0340 01/31/23 0347  WBC 6.1 6.0 7.1 12.1* 20.9* 17.7*  HGB 9.5* 10.5* 9.8* 10.8* 10.9* 9.6*  HCT 28.6* 31.7* 29.6* 32.6* 32.2* 29.5*  PLT 163 164 148* 158 171 178  MCV 88.0 89.0 87.8 89.6 89.7 89.4  MCH 29.2 29.5 29.1 29.7 30.4 29.1  MCHC 33.2 33.1 33.1 33.1 33.9 32.5  RDW 14.7 14.6 14.5 14.4 14.8 14.6  LYMPHSABS 0.6*  --  0.2*  --   --  0.3*  MONOABS 0.5  --  0.5  --   --  1.1*  EOSABS 0.2  --  0.0  --   --  0.0  BASOSABS 0.0  --  0.0  --   --  0.0    Recent Labs  Lab 01/25/23 0459 01/27/23 0735 01/28/23 0621 01/28/23 1951 01/28/23 2037 01/29/23 0548 01/30/23 0340 01/30/23 0744 01/31/23 0347  NA 139 139 136 134*  --  137 138  --  139  K 3.3* 3.6 3.3* 3.1*  --  3.9 4.0  --  3.1*  CL 103 105 101 101  --  102 103  --  108  CO2 24 23 24 23   --  22 22  --  21*  ANIONGAP 12 11 11 10   --  13 13  --  10   GLUCOSE 111* 119* 118* 174*  --  131* 146*  --  124*  BUN 32* 42* 34* 33*  --  29* 33*  --  39*  CREATININE 2.37* 2.43* 2.31* 2.28*  --  2.05* 2.08*  --  1.92*  AST 27  --   --   --   --   --  21  --   --   ALT 43  --   --   --   --   --  32  --   --   ALKPHOS 62  --   --   --   --   --  77  --   --   BILITOT 0.5  --   --   --   --   --  1.0  --   --   ALBUMIN 1.8*  --  2.0*  --   --  2.4* 2.1*  --   --   CRP  --   --   --   --   --   --   --  20.0* 22.1*  PROCALCITON  --   --   --   --   --   --   --  1.15 1.02  LATICACIDVEN  --   --   --  0.9 0.8  --   --   --   --   INR  --   --   --   --   --   --   --  1.2  --   BNP  --   --   --   --   --   --  253.6*  --  194.8*  MG 1.3* 1.5*  --   --   --  1.4* 2.1  --  1.9  CALCIUM 8.4* 7.8* 7.8* 7.8*  --  8.9 9.0  --  8.3*      Recent Labs  Lab 01/25/23 0459 01/27/23 0735 01/28/23 0621 01/28/23 1951 01/28/23 2037 01/29/23 0548 01/30/23 0340 01/30/23 0744 01/31/23 0347  CRP  --   --   --   --   --   --   --  20.0* 22.1*  PROCALCITON  --   --   --   --   --   --   --  1.15 1.02  LATICACIDVEN  --   --   --  0.9 0.8  --   --   --   --  INR  --   --   --   --   --   --   --  1.2  --   BNP  --   --   --   --   --   --  253.6*  --  194.8*  MG 1.3* 1.5*  --   --   --  1.4* 2.1  --  1.9  CALCIUM 8.4* 7.8* 7.8* 7.8*  --  8.9 9.0  --  8.3*    --------------------------------------------------------------------------------------------------------------- Lab Results  Component Value Date   CHOL 96 01/27/2023   HDL 29 (L) 01/27/2023   LDLCALC 52 01/27/2023   LDLDIRECT 86.8 09/27/2012   TRIG 75 01/27/2023   CHOLHDL 3.3 01/27/2023    Lab Results  Component Value Date   HGBA1C 6.3 (H) 12/29/2022   No results for input(s): "TSH", "T4TOTAL", "FREET4", "T3FREE", "THYROIDAB" in the last 72 hours. No results for input(s): "VITAMINB12", "FOLATE", "FERRITIN", "TIBC", "IRON", "RETICCTPCT" in the last 72  hours. ------------------------------------------------------------------------------------------------------------------ Cardiac Enzymes No results for input(s): "CKMB", "TROPONINI", "MYOGLOBIN" in the last 168 hours.  Invalid input(s): "CK"  Micro Results No results found for this or any previous visit (from the past 240 hour(s)).  Radiology Reports DG Abd 1 View  Result Date: 01/30/2023 CLINICAL DATA:  Small bowel obstruction. EXAM: ABDOMEN - 1 VIEW COMPARISON:  January 28, 2023. FINDINGS: The bowel gas pattern is normal. IVC filter is noted. Surgical sutures are seen in left side of abdomen. IMPRESSION: No abnormal bowel dilatation. Electronically Signed   By: Lupita Raider M.D.   On: 01/30/2023 11:55   DG Chest 1 View  Result Date: 01/30/2023 CLINICAL DATA:  Shortness of breath. EXAM: CHEST  1 VIEW COMPARISON:  January 09, 2023. FINDINGS: The heart size and mediastinal contours are within normal limits. Right internal jugular dialysis catheter is noted with distal tip in expected position of cavoatrial junction. Both lungs are clear. The visualized skeletal structures are unremarkable. IMPRESSION: No active disease. Electronically Signed   By: Lupita Raider M.D.   On: 01/30/2023 11:53   CT ABDOMEN PELVIS WO CONTRAST  Result Date: 01/29/2023 CLINICAL DATA:  Abdominal pain, acute, nonlocalized SMA thrombectomy and angioplasty with a stent to the distal SMA w/ vascular and ex-lap, EXAM: CT ABDOMEN AND PELVIS WITHOUT CONTRAST TECHNIQUE: Multidetector CT imaging of the abdomen and pelvis was performed following the standard protocol without IV contrast. RADIATION DOSE REDUCTION: This exam was performed according to the departmental dose-optimization program which includes automated exposure control, adjustment of the mA and/or kV according to patient size and/or use of iterative reconstruction technique. COMPARISON:  CT abdomen pelvis 12/21/2022 chole CTA GI bleed 12/29/2022 FINDINGS:  Lower chest: Bilateral trace pleural effusions. Hepatobiliary: No focal liver abnormality. No gallstones, gallbladder wall thickening, or pericholecystic fluid. No biliary dilatation. Pancreas: No focal lesion. Normal pancreatic contour. No surrounding inflammatory changes. No main pancreatic ductal dilatation. Spleen: Normal in size without focal abnormality. Adrenals/Urinary Tract: No adrenal nodule bilaterally. No nephrolithiasis and no hydronephrosis. No definite contour-deforming renal mass. No ureterolithiasis or hydroureter. The urinary bladder is unremarkable. Stomach/Bowel: Small bowel resection surgical changes noted. Stomach is within normal limits. Similar-appearing dilatation of several loops of the small bowel within the left mid abdomen in the setting of small bowel resection surgical changes. These loops of small bowel no longer demonstrate bowel wall thickening but instead fecalized material within the lumen. Colonic diverticulosis. Under distended rectum with persistent trace perirectal fat stranding. Appendix appears normal.  Vascular/Lymphatic: No portal venous or mesenteric venous gas. Inferior vena cava filter in grossly appropriate position. SMA stent. No abdominal aorta or iliac aneurysm. Severe atherosclerotic plaque of the aorta and its branches. No abdominal, pelvic, or inguinal lymphadenopathy. Reproductive: Prostate is unremarkable. Other: No intraperitoneal free fluid. No intraperitoneal free gas. No organized fluid collection. Musculoskeletal: No abdominal wall hernia or abnormality. No suspicious lytic or blastic osseous lesions. No acute displaced fracture. IMPRESSION: 1. Limited evaluation on this noncontrast study. 2. Similar-appearing dilatation of several loops of the small bowel within the left mid abdomen in the setting of small bowel resection surgical changes. These loops of small bowel no longer demonstrate bowel wall thickening but instead fecalized material within the  lumen. Findings suggestive of a slow transition state with possible early or partial small bowel obstruction. No pneumatosis. No bowel wall perforation. Consider small-bowel follow-through radiographs series. 3. Under-distended rectum with persistent trace perirectal fat stranding. Recommend attention on follow-up. 4. Colonic diverticulosis with no acute diverticulitis. 5. Inferior vena cava filter in grossly appropriate position. 6.  Aortic Atherosclerosis (ICD10-I70.0)- severe. 7. Bilateral trace pleural effusions. Electronically Signed   By: Tish Frederickson M.D.   On: 01/29/2023 01:12   DG Abd 2 Views  Result Date: 01/28/2023 CLINICAL DATA:  Diarrhea EXAM: ABDOMEN - 2 VIEW COMPARISON:  12/28/2022 FINDINGS: The bowel gas pattern is normal. There is no evidence of free air. IVC filter in place. No radio-opaque calculi or other significant radiographic abnormality is seen. Vascular calcifications. IMPRESSION: Negative. Electronically Signed   By: Duanne Guess D.O.   On: 01/28/2023 18:56      Signature  -   Susa Raring M.D on 01/31/2023 at 9:05 AM   -  To page go to [email or web address]

## 2023-01-31 NOTE — Progress Notes (Signed)
Physical Therapy Treatment Patient Details Name: [name] [name] MRN: [id number] DOB: January 12, 1956 Today's Date: 01/31/2023   History of Present Illness 67 y.o. male was in inpatient rehab who comes into the hospital 01/29/23 with abdominal pain and ?SBO. 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 Recent RLE DVT with IVC placed. PMHx:testicular CA, HTN, ankle and patellar fx's, retinal detachment, intestinal stromal tumor 2022,    PT Comments  Session still limited by dizziness, orthostatics vs? and  diarrhea.  Utilized resisted exercise, sit to standing trials and TED hose in attempt to raise BP's, though pt dizzy even with moderate BP's.  Pt still symptomatic with diarrhea and declined gait.    If plan is discharge home, recommend the following: Assistance with cooking/housework;Assist for transportation;A little help with walking and/or transfers;Help with stairs or ramp for entrance   Can travel by private vehicle        Equipment Recommendations  None recommended by PT    Recommendations for Other Services Rehab consult     Precautions / Restrictions Precautions Precautions: Fall (diarrhea, orthostasis) Precaution Comments: othostasis (has TED hose); abdominal incision     Mobility  Bed Mobility Overal bed mobility: Needs Assistance Bed Mobility: Rolling, Sidelying to Sit, Sit to Sidelying Rolling: Independent, Modified independent (Device/Increase time)         General bed mobility comments: utilizes log roll technique    Transfers Overall transfer level: Needs assistance Equipment used: None Transfers: Sit to/from Stand Sit to Stand: Contact guard assist, Min assist           General transfer comment: 7 sit to stands to raise BP's  more assist needed with fatigue    Ambulation/Gait               General Gait Details: unable due to dizziness   Stairs              Wheelchair Mobility     Tilt Bed    Modified Rankin (Stroke Patients Only)       Balance Overall balance assessment: Needs assistance Sitting-balance support: No upper extremity supported, Feet supported Sitting balance-Leahy Scale: Good Sitting balance - Comments: no LOB     Standing balance-Leahy Scale: Fair Standing balance comment: stood for minutes at a time x3 for BP, clean up and change of gowns  BP in sitting 164/86 (108) standing 147/65, dizzy no matter the position once activity start with sitting EOB.                            Cognition Arousal: Alert Behavior During Therapy: Flat affect, WFL for tasks assessed/performed Overall Cognitive Status: Within Functional Limits for tasks assessed                                          Exercises Other Exercises Other Exercises: resisted hip flex/ext x10 reps Other Exercises: 7 sit to stands at EOB without rest between    General Comments General comments (skin integrity, edema, etc.): extensive pericare x3, orthostatics taken sitting/standing.      Pertinent Vitals/Pain Pain Assessment Pain Assessment: Faces Faces Pain Scale: Hurts a little bit Pain Location: buttocks Pain Descriptors / Indicators: Discomfort Pain Intervention(s): Monitored during session    Home Living  Prior Function            PT Goals (current goals can now be found in the care plan section) Acute Rehab PT Goals Patient Stated Goal: stop feeling dizzy and return home (not to rehab) PT Goal Formulation: With patient Time For Goal Achievement: 02/13/23 Potential to Achieve Goals: Good Progress towards PT goals: Progressing toward goals (limited by dizziness and diarrhea)    Frequency    Min 1X/week      PT Plan      Co-evaluation              AM-PAC PT "6 Clicks" Mobility   Outcome Measure  Help needed turning from your back to your side  while in a flat bed without using bedrails?: None Help needed moving from lying on your back to sitting on the side of a flat bed without using bedrails?: None Help needed moving to and from a bed to a chair (including a wheelchair)?: A Little Help needed standing up from a chair using your arms (e.g., wheelchair or bedside chair)?: A Little Help needed to walk in hospital room?: A Lot Help needed climbing 3-5 steps with a railing? : A Lot 6 Click Score: 18    End of Session   Activity Tolerance:  (limited by dizziness and diarrhea) Patient left: in bed;with call bell/phone within reach;with family/visitor present Nurse Communication: Mobility status;Other (comment) PT Visit Diagnosis: Other abnormalities of gait and mobility (R26.89)     Time: 4782-9562 PT Time Calculation (min) (ACUTE ONLY): 43 min  Charges:    $Therapeutic Exercise: 8-22 mins $Therapeutic Activity: 23-37 mins PT General Charges $$ ACUTE PT VISIT: 1 Visit                     01/31/2023  Jacinto Halim., PT Acute Rehabilitation Services [phone]  (office)   Eliseo Gum Kaydee Magel 01/31/2023, 5:38 PM

## 2023-01-31 NOTE — Plan of Care (Signed)
[facility] Tonsillectomy/Adenoidectomy, Postoperative PEDS care plan entered in error.

## 2023-01-31 NOTE — Progress Notes (Addendum)
Bladder scan showed 310 ml urine retention.Patient wants to wait and try void .Patient has been voiding intermittently but not able to completely empty his bladder.  At 6;50am Patient voided .

## 2023-01-31 NOTE — Progress Notes (Signed)
[facility]   Subjective  -no abdominal pain Objective (!) 169/92 85 98.3 F (36.8 C) (Oral) 17 97%  Abdominal exam - soft, no peritonitis.   Laboratory Lab Results: Recent Labs    01/30/23 0340 01/31/23 0347  WBC 20.9* 17.7*  HGB 10.9* 9.6*  HCT 32.2* 29.5*  PLT 171 178   BMET Recent Labs    01/30/23 0340 01/31/23 0347  NA 138 139  K 4.0 3.1*  CL 103 108  CO2 22 21*  GLUCOSE 146* 124*  BUN 33* 39*  CREATININE 2.08* 1.92*  CALCIUM 9.0 8.3*    COAG Lab Results  Component Value Date   INR 1.2 01/30/2023   INR 1.7 (H) 12/29/2022   No results found for: "PTT"  Assessment/Planning:  Patient is a 67 year old male with previous history of SMA stenting for acute on chronic mesenteric ischemia. He has been unable to tolerate antiplatelet therapy or anticoagulation due to ischemic proctitis. He understands this is a very poor prognosis as stent patency is tied to antiplatelet therapy. Patient with new DVT, now status post IVC filter.  Abdominal pain resolved. Multiple bowel movements.   Swelling in the left arm today. 0 needs ultrasound for DVT.  Also think this is a great window to restart ASA 81mg  and see how he does.    I have discussed the above with general surgery.  Will continue to follow closely.  Victorino Sparrow 01/31/2023 1:55 PM --

## 2023-01-31 NOTE — Progress Notes (Signed)
Bilateral upper extremity venous duplex has been completed. Preliminary results can be found in CV Proc through chart review.  Results were given to the patient's nurse, Ranjita.  01/31/23 4:07 PM Olen Cordial RVT

## 2023-01-31 NOTE — Progress Notes (Signed)
Patient ID: [name] [name], male   DOB: 10-24-1955, 67 y.o.   MRN: [id number] [facility] Surgery Progress Note     Subjective: CC-  Denies abdominal pain this morning. Denies n/v. Asking if he can have something to drink. Reports having 4-5 episodes of loose stool over night. Stool is brown. Last dose of pain medication was at 2100 last night. WBC slightly down 17.7 from 20.9. HR 90s from low 100s, BP elevated.  Objective: Vital signs in last 24 hours: Temp:  [98 F (36.7 C)-98.6 F (37 C)] 98.3 F (36.8 C) (10/23 0400) Pulse Rate:  [91-108] 98 (10/23 0400) Resp:  [17-20] 19 (10/23 0800) BP: (133-192)/(75-98) 158/80 (10/23 0400) SpO2:  [94 %-97 %] 97 % (10/23 0400) Last BM Date : 01/31/23  Intake/Output from previous day: 10/22 0701 - 10/23 0700 In: 462.3 [I.V.:462.3] Out: 1390 [Urine:1390] Intake/Output this shift: No intake/output data recorded.  PE: Gen:  Alert, NAD Card:  RRR Pulm:  rate and effort normal on room air Abd: soft, mild distension, nontender, cdi dressing to midline  Lab Results:  Recent Labs    01/30/23 0340 01/31/23 0347  WBC 20.9* 17.7*  HGB 10.9* 9.6*  HCT 32.2* 29.5*  PLT 171 178   BMET Recent Labs    01/30/23 0340 01/31/23 0347  NA 138 139  K 4.0 3.1*  CL 103 108  CO2 22 21*  GLUCOSE 146* 124*  BUN 33* 39*  CREATININE 2.08* 1.92*  CALCIUM 9.0 8.3*   PT/INR Recent Labs    01/30/23 0744  LABPROT 15.3*  INR 1.2   CMP     Component Value Date/Time   NA 139 01/31/2023 0347   K 3.1 (L) 01/31/2023 0347   CL 108 01/31/2023 0347   CO2 21 (L) 01/31/2023 0347   GLUCOSE 124 (H) 01/31/2023 0347   BUN 39 (H) 01/31/2023 0347   CREATININE 1.92 (H) 01/31/2023 0347   CALCIUM 8.3 (L) 01/31/2023 0347   PROT 5.4 (L) 01/30/2023 0340   PROT 6.3 03/16/2020 0800   ALBUMIN 2.1 (L) 01/30/2023 0340   ALBUMIN 4.3 03/16/2020 0800   AST 21 01/30/2023 0340   ALT 32 01/30/2023 0340   ALKPHOS 77 01/30/2023 0340   BILITOT 1.0 01/30/2023 0340    BILITOT 0.8 03/16/2020 0800   GFRNONAA 37 (L) 01/31/2023 0347   GFRAA >60 04/14/2015 1127   Lipase     Component Value Date/Time   LIPASE 28 11/27/2022 0755       Studies/Results: DG Abd 1 View  Result Date: 01/30/2023 CLINICAL DATA:  Small bowel obstruction. EXAM: ABDOMEN - 1 VIEW COMPARISON:  January 28, 2023. FINDINGS: The bowel gas pattern is normal. IVC filter is noted. Surgical sutures are seen in left side of abdomen. IMPRESSION: No abnormal bowel dilatation. Electronically Signed   By: Lupita Raider M.D.   On: 01/30/2023 11:55   DG Chest 1 View  Result Date: 01/30/2023 CLINICAL DATA:  Shortness of breath. EXAM: CHEST  1 VIEW COMPARISON:  January 09, 2023. FINDINGS: The heart size and mediastinal contours are within normal limits. Right internal jugular dialysis catheter is noted with distal tip in expected position of cavoatrial junction. Both lungs are clear. The visualized skeletal structures are unremarkable. IMPRESSION: No active disease. Electronically Signed   By: Lupita Raider M.D.   On: 01/30/2023 11:53    Anti-infectives: Anti-infectives (From admission, onward)    None        Assessment/Plan 67 yo male with SMA  stenosis/thrombosis and small bowel ischemia -S/p SMA thrombectomy and stent 9/12 Dr. Karin Lieu, exploratory laparotomy and small bowel resection Dr. Janee Morn -S/p re-exploration, replace abthera FB 12/23/22   -S/p re-exploration, small bowel resection with anastomosis x2, assessment of bowel perfusion with ICG, abdominal closure 9/16 Dr. Andrey Campanile - CT 10/20, noncon looks more postop in nature, lactic acid normal 10/20 - No complaints of abdominal pain this morning. He is having bowel function again and no n/v. WBC slightly down.  I think he could try clear liquids again today, will discuss with MD  ID: none currently FEN: NPO VTE: SCDs   LOS: 2 days    Franne Forts, [facility] Surgery 01/31/2023, 8:13 AM Please see Amion for  pager number during day hours 7:00am-4:30pm

## 2023-01-31 NOTE — Plan of Care (Signed)

## 2023-01-31 NOTE — Progress Notes (Signed)
VAST consult received to obtain new IV access. Pt's nurse reported that his entire left arm is swollen so she dc'd PIV. Pt reported he doesn't have any pain in left arm, but he can tell it is swollen and hot. He also reported there was no swelling in that arm previously. New IV placed in right arm. Thedore Mins, MD contacted via SecureChat to inquire about order for possible doppler to left arm. Unit RN added to SecureChat and aware.

## 2023-02-01 ENCOUNTER — Inpatient Hospital Stay (HOSPITAL_COMMUNITY): Payer: Medicare Other

## 2023-02-01 DIAGNOSIS — K56609 Unspecified intestinal obstruction, unspecified as to partial versus complete obstruction: Secondary | ICD-10-CM | POA: Diagnosis not present

## 2023-02-01 LAB — CBC WITH DIFFERENTIAL/PLATELET
Abs Immature Granulocytes: 0.25 10*3/uL — ABNORMAL HIGH (ref 0.00–0.07)
Basophils Absolute: 0 10*3/uL (ref 0.0–0.1)
Basophils Relative: 0 %
Eosinophils Absolute: 0 10*3/uL (ref 0.0–0.5)
Eosinophils Relative: 0 %
HCT: 28 % — ABNORMAL LOW (ref 39.0–52.0)
Hemoglobin: 9.3 g/dL — ABNORMAL LOW (ref 13.0–17.0)
Immature Granulocytes: 2 %
Lymphocytes Relative: 2 %
Lymphs Abs: 0.3 10*3/uL — ABNORMAL LOW (ref 0.7–4.0)
MCH: 29.9 pg (ref 26.0–34.0)
MCHC: 33.2 g/dL (ref 30.0–36.0)
MCV: 90 fL (ref 80.0–100.0)
Monocytes Absolute: 0.9 10*3/uL (ref 0.1–1.0)
Monocytes Relative: 6 %
Neutro Abs: 12.7 10*3/uL — ABNORMAL HIGH (ref 1.7–7.7)
Neutrophils Relative %: 90 %
Platelets: 153 10*3/uL (ref 150–400)
RBC: 3.11 MIL/uL — ABNORMAL LOW (ref 4.22–5.81)
RDW: 14.4 % (ref 11.5–15.5)
WBC: 14.2 10*3/uL — ABNORMAL HIGH (ref 4.0–10.5)
nRBC: 0 % (ref 0.0–0.2)

## 2023-02-01 LAB — PROCALCITONIN: Procalcitonin: 0.88 ng/mL

## 2023-02-01 LAB — BASIC METABOLIC PANEL
Anion gap: 6 (ref 5–15)
BUN: 33 mg/dL — ABNORMAL HIGH (ref 8–23)
CO2: 23 mmol/L (ref 22–32)
Calcium: 8.1 mg/dL — ABNORMAL LOW (ref 8.9–10.3)
Chloride: 107 mmol/L (ref 98–111)
Creatinine, Ser: 1.65 mg/dL — ABNORMAL HIGH (ref 0.61–1.24)
GFR, Estimated: 45 mL/min — ABNORMAL LOW (ref >60)
Glucose, Bld: 111 mg/dL — ABNORMAL HIGH (ref 70–99)
Potassium: 3.1 mmol/L — ABNORMAL LOW (ref 3.5–5.1)
Sodium: 136 mmol/L (ref 135–145)

## 2023-02-01 LAB — CBC
HCT: 27.5 % — ABNORMAL LOW (ref 39.0–52.0)
Hemoglobin: 9.2 g/dL — ABNORMAL LOW (ref 13.0–17.0)
MCH: 29.7 pg (ref 26.0–34.0)
MCHC: 33.5 g/dL (ref 30.0–36.0)
MCV: 88.7 fL (ref 80.0–100.0)
Platelets: 190 10*3/uL (ref 150–400)
RBC: 3.1 MIL/uL — ABNORMAL LOW (ref 4.22–5.81)
RDW: 14.5 % (ref 11.5–15.5)
WBC: 13.7 10*3/uL — ABNORMAL HIGH (ref 4.0–10.5)
nRBC: 0 % (ref 0.0–0.2)

## 2023-02-01 LAB — PHOSPHORUS: Phosphorus: 2.7 mg/dL (ref 2.5–4.6)

## 2023-02-01 LAB — BRAIN NATRIURETIC PEPTIDE: B Natriuretic Peptide: 230.8 pg/mL — ABNORMAL HIGH (ref 0.0–100.0)

## 2023-02-01 LAB — LACTIC ACID, PLASMA: Lactic Acid, Venous: 0.7 mmol/L (ref 0.5–1.9)

## 2023-02-01 LAB — C-REACTIVE PROTEIN: CRP: 18 mg/dL — ABNORMAL HIGH (ref <1.0)

## 2023-02-01 LAB — MAGNESIUM: Magnesium: 1.7 mg/dL (ref 1.7–2.4)

## 2023-02-01 MED ORDER — HYDROMORPHONE HCL 1 MG/ML IJ SOLN
1.0000 mg | INTRAMUSCULAR | Status: DC | PRN
  Administered 2023-02-01 – 2023-02-07 (×21): 1 mg via INTRAVENOUS
  Filled 2023-02-01 (×24): qty 1

## 2023-02-01 MED ORDER — RISAQUAD PO CAPS
1.0000 | ORAL_CAPSULE | Freq: Every day | ORAL | Status: DC
  Administered 2023-02-01 – 2023-02-05 (×5): 1 via ORAL
  Filled 2023-02-01 (×7): qty 1

## 2023-02-01 MED ORDER — SIMETHICONE 40 MG/0.6ML PO SUSP
80.0000 mg | Freq: Four times a day (QID) | ORAL | Status: DC | PRN
  Administered 2023-02-01: 80 mg via ORAL
  Filled 2023-02-01 (×2): qty 1.2

## 2023-02-01 MED ORDER — POTASSIUM CHLORIDE 10 MEQ/100ML IV SOLN
10.0000 meq | INTRAVENOUS | Status: AC
  Administered 2023-02-01 (×6): 10 meq via INTRAVENOUS
  Filled 2023-02-01 (×6): qty 100

## 2023-02-01 MED ORDER — POTASSIUM CHLORIDE 20 MEQ PO PACK
40.0000 meq | PACK | Freq: Once | ORAL | Status: AC
  Administered 2023-02-01: 40 meq via ORAL
  Filled 2023-02-01: qty 2

## 2023-02-01 MED ORDER — DEXTROSE-SODIUM CHLORIDE 5-0.45 % IV SOLN: INTRAVENOUS | Status: DC

## 2023-02-01 MED ORDER — POTASSIUM CHLORIDE CRYS ER 20 MEQ PO TBCR: 40.0000 meq | EXTENDED_RELEASE_TABLET | Freq: Once | ORAL | Status: DC

## 2023-02-01 MED ORDER — HYDROMORPHONE HCL 1 MG/ML IJ SOLN
0.5000 mg | INTRAMUSCULAR | Status: DC | PRN
  Administered 2023-02-01: 0.5 mg via INTRAVENOUS
  Filled 2023-02-01: qty 0.5

## 2023-02-01 NOTE — Progress Notes (Addendum)
[facility] [place]  Subjective  - He states he started having abdominal pain this am and he later tried to eat a little at lunch time.  Since lunch he has had increased 8 out of 10 abdominal pain.     Objective (!) 166/75 79 98.3 F (36.8 C) (Oral) 19 96%  Intake/Output Summary (Last 24 hours) at 02/01/2023 1644 Last data filed at 02/01/2023 0435 Gross per 24 hour  Intake 429.83 ml  Output 800 ml  Net -370.17 ml    Abdominal + BS to auscultation Abdomin over all soft to palpation without pain to palpation Feet are warm and well perfused   Abdominal x ray this am FINDINGS: Portable AP supine view at 0632 hours. Negative lung bases. IVC filter redemonstrated. Nonobstructed bowel gas pattern, mid abdominal small bowel loops with flocculated material not significantly changed from the CT on 01/28/2023. Extensive Aortoiliac calcified atherosclerosis. No acute osseous abnormality identified.   IMPRESSION: Stable bowel gas pattern without evidence of acute bowel obstruction.  Assessment/Planning: Patient is a 67 year old male with previous history of SMA stenting for acute on chronic mesenteric ischemia. He has been unable to tolerate antiplatelet therapy or anticoagulation due to ischemic proctitis. He understands this is a very poor prognosis as stent patency is tied to antiplatelet therapy. Patient with new DVT, now status post IVC filter.  He was given an 81 mg ASA and has tolerated this for 24 hours without rectal bleeding. He has + BS and has produced stools   Xray this am shows no evidence of bowel obstruction.  No intervention is indicated at this time. He has CKD with a current Cr of 1.65.  He may need a repeat CTA of the abdomin if the pain persists or worsens. His WBC has decreased since yesterday from 17 to 14.    Pending repeat abdominal x ray today.  Dr. Lenell Antu saw this patient in conjunction and agrees with the assessment and  plan.    Mosetta Pigeon 02/01/2023 4:44 PM --  Laboratory Lab Results: Recent Labs    01/31/23 0347 02/01/23 0611  WBC 17.7* 14.2*  HGB 9.6* 9.3*  HCT 29.5* 28.0*  PLT 178 153   BMET Recent Labs    01/31/23 0347 02/01/23 0611  NA 139 136  K 3.1* 3.1*  CL 108 107  CO2 21* 23  GLUCOSE 124* 111*  BUN 39* 33*  CREATININE 1.92* 1.65*  CALCIUM 8.3* 8.1*    COAG Lab Results  Component Value Date   INR 1.2 01/30/2023   INR 1.7 (H) 12/29/2022   No results found for: "PTT"  VASCULAR STAFF ADDENDUM: I have independently interviewed and examined the patient. I agree with the above.   [name]. Lenell Antu, MD [facility] Phone Number: [phone] 02/01/2023 5:29 PM

## 2023-02-01 NOTE — Progress Notes (Signed)
Subjective/Chief Complaint: Having bowel function, no ab pain, tol liquids   Objective: Vital signs in last 24 hours: Temp:  [98 F (36.7 C)-98.4 F (36.9 C)] 98.3 F (36.8 C) (10/24 0435) Pulse Rate:  [73-88] 80 (10/24 0621) Resp:  [17-22] 22 (10/24 0621) BP: (140-178)/(61-92) 178/69 (10/24 0621) SpO2:  [95 %-98 %] 96 % (10/24 0621) Weight:  [106 kg] 106 kg (10/24 0500) Last BM Date : 01/31/23  Intake/Output from previous day: 10/23 0701 - 10/24 0700 In: 429.8 [I.V.:327.8; IV Piggyback:102] Out: 1100 [Urine:1100] Intake/Output this shift: No intake/output data recorded.  Ab soft nontender wound without infection  Lab Results:  Recent Labs    01/31/23 0347 02/01/23 0611  WBC 17.7* 14.2*  HGB 9.6* 9.3*  HCT 29.5* 28.0*  PLT 178 153   BMET Recent Labs    01/31/23 0347 02/01/23 0611  NA 139 136  K 3.1* 3.1*  CL 108 107  CO2 21* 23  GLUCOSE 124* 111*  BUN 39* 33*  CREATININE 1.92* 1.65*  CALCIUM 8.3* 8.1*   PT/INR Recent Labs    01/30/23 0744  LABPROT 15.3*  INR 1.2   ABG No results for input(s): "PHART", "HCO3" in the last 72 hours.  Invalid input(s): "PCO2", "PO2"  Studies/Results: DG Abd Portable 1V  Result Date: 02/01/2023 CLINICAL DATA:  67 year old male small-bowel obstruction. EXAM: PORTABLE ABDOMEN - 1 VIEW COMPARISON:  01/31/2023 and earlier. FINDINGS: Portable AP supine view at 0632 hours. Negative lung bases. IVC filter redemonstrated. Nonobstructed bowel gas pattern, mid abdominal small bowel loops with flocculated material not significantly changed from the CT on 01/28/2023. Extensive Aortoiliac calcified atherosclerosis. No acute osseous abnormality identified. IMPRESSION: Stable bowel gas pattern without evidence of acute bowel obstruction. Aortic Atherosclerosis (ICD10-I70.0). Electronically Signed   By: Odessa Fleming M.D.   On: 02/01/2023 07:41   VAS [place] UPPER EXTREMITY VENOUS DUPLEX  Result Date: 01/31/2023 UPPER VENOUS STUDY   Patient Name:  ISABELLE HEDDING  Date of Exam:   01/31/2023 Medical Rec #: [id number]      Accession #:    [id number] Date of Birth: 1956-04-03      Patient Gender: M Patient Age:   54 years Exam Location:  [facility] Procedure:      VAS [place] UPPER EXTREMITY VENOUS DUPLEX Referring Phys: Bess Harvest [facility] --------------------------------------------------------------------------------  Indications: Swelling Risk Factors: DVT Surgery 01/26/2023 - IVC filter. Limitations: Poor ultrasound/tissue interface and line. Comparison Study: 01/25/2023 - Bilateral lower extremity venous duplex                   Acute deep venous thrombosis of right popliteal vein, right                   peroneal vein,                   left peroneal vein, left soleal vein, and left gastrocnemius                   vein. Please                   see table above for more detail. Performing Technologist: Chanda Busing RVT  Examination Guidelines: A complete evaluation includes B-mode imaging, spectral Doppler, color Doppler, and power Doppler as needed of all accessible portions of each vessel. Bilateral testing is considered an integral part of a complete examination. Limited examinations for reoccurring indications may be performed as noted.  Right Findings: +----------+------------+---------+-----------+----------+-------+ RIGHT  CompressiblePhasicitySpontaneousPropertiesSummary +----------+------------+---------+-----------+----------+-------+ IJV           Full       Yes       Yes                      +----------+------------+---------+-----------+----------+-------+ Subclavian    Full       Yes       Yes                      +----------+------------+---------+-----------+----------+-------+ Axillary      Full       Yes       Yes                      +----------+------------+---------+-----------+----------+-------+ Brachial      Full                                           +----------+------------+---------+-----------+----------+-------+ Radial        Full                                          +----------+------------+---------+-----------+----------+-------+ Ulnar         Full                                          +----------+------------+---------+-----------+----------+-------+ Cephalic      None                                   Acute  +----------+------------+---------+-----------+----------+-------+ Basilic       Full                                          +----------+------------+---------+-----------+----------+-------+ Superficial thrombus detected in the cephalic vein is noted to be in the mid forearm.  Left Findings: +----------+------------+---------+-----------+----------+-------+ LEFT      CompressiblePhasicitySpontaneousPropertiesSummary +----------+------------+---------+-----------+----------+-------+ IJV           Full       Yes       Yes                      +----------+------------+---------+-----------+----------+-------+ Subclavian    Full       Yes       Yes                      +----------+------------+---------+-----------+----------+-------+ Axillary      Full       Yes       Yes                      +----------+------------+---------+-----------+----------+-------+ Brachial      Full                                          +----------+------------+---------+-----------+----------+-------+ Radial        Full                                          +----------+------------+---------+-----------+----------+-------+  Ulnar         Full                                          +----------+------------+---------+-----------+----------+-------+ Cephalic      Full                                          +----------+------------+---------+-----------+----------+-------+ Basilic       Full                                           +----------+------------+---------+-----------+----------+-------+  Summary:  Right: No evidence of deep vein thrombosis in the upper extremity. Findings consistent with acute superficial vein thrombosis involving the right cephalic vein. Superficial thrombus detected in the cephalic vein is noted to be in the mid forearm.  Left: No evidence of deep vein thrombosis in the upper extremity. No evidence of superficial vein thrombosis in the upper extremity.  *See table(s) above for measurements and observations.  Diagnosing physician: Coral Else MD Electronically signed by Coral Else MD on 01/31/2023 at 6:24:36 PM.    Final    DG Abd Portable 1V  Result Date: 01/31/2023 CLINICAL DATA:  101717 Nausea 101717 EXAM: PORTABLE ABDOMEN - 1 VIEW COMPARISON:  Abdominal radiograph 01/30/2023. FINDINGS: Nonobstructive bowel-gas pattern. IVC filter in place. Lower lumbar spondylosis. IMPRESSION: Nonobstructive bowel-gas pattern. Electronically Signed   By: Orvan Falconer M.D.   On: 01/31/2023 09:26    Anti-infectives: Anti-infectives (From admission, onward)    None       Assessment/Plan: 67 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 Karin Lieu, exploratory laparotomy and small bowel resection Dr. Janee Morn -S/p re-exploration, replace abthera FB 12/23/22   -S/p re-exploration, small bowel resection with anastomosis x2, assessment of bowel perfusion with ICG, abdominal closure 9/16 Dr. Andrey Campanile - no concerns today -advance to soft food -cover incision -will see again early next week if needed   ID: none currently FEN: NPO VTE: SCDs  Emelia Loron 02/01/2023

## 2023-02-01 NOTE — Progress Notes (Signed)
Patient ID: [name] [name], male   DOB: 1956/02/05, 67 y.o.   MRN: [id number] Asked to see patient by primary team.  He has had increasing ab pain since morning. He had fulls for breakfast and soft for lunch. Still having stool and flatus. No emesis.  Bp up at little, heart rate normal.  Abdomen is soft and really not tender.  Concern is for ischemia still. Will write for lactic acid and cbc.  Has xray pending.  Have discussed with Dr Thedore Mins who is contacting vascular surgery

## 2023-02-01 NOTE — Progress Notes (Signed)
PT Cancellation Note  Patient Details Name: [name] [name] MRN: [id number] DOB: 04-01-1956   Cancelled Treatment:    Reason Eval/Treat Not Completed: (P) Pain limiting ability to participate (Attempt ~12:15pm, RN defer as pt just requested to take a nap. Attempt again at 1700, pt defers due to c/o severe (9/10) abdominal pain, even after IV pain meds given. Offered bed-level session but pt defers. Encouraged supine BLE ROM as able.) Will continue efforts per PT plan of care as schedule permits.   Dorathy Kinsman Daneisha Surges 02/01/2023, 5:45 PM

## 2023-02-01 NOTE — Plan of Care (Signed)

## 2023-02-01 NOTE — Care Management Important Message (Signed)
Important Message  Patient Details  Name: [name] [name] MRN: [id number] Date of Birth: 08-Aug-1955   Important Message Given:  Yes - Medicare IM     Dorena Bodo 02/01/2023, 2:47 PM

## 2023-02-01 NOTE — Progress Notes (Addendum)
BNP  --   --   --   --   --  253.6*  --  194.8* 230.8*  MG 1.5*  --   --   --  1.4* 2.1  --  1.9 1.7  CALCIUM 7.8* 7.8* 7.8*  --  8.9 9.0  --  8.3* 8.1*      Recent Labs  Lab 01/27/23 0735 01/28/23 0621 01/28/23 1951 01/28/23 2037 01/29/23 0548 01/30/23 0340 01/30/23 0744 01/31/23 0347 02/01/23 0611  CRP  --   --   --   --   --   --  20.0* 22.1* 18.0*  PROCALCITON  --   --   --   --   --   --  1.15 1.02 0.88  LATICACIDVEN  --   --  0.9 0.8  --   --   --   --    --   INR  --   --   --   --   --   --  1.2  --   --   BNP  --   --   --   --   --  253.6*  --  194.8* 230.8*  MG 1.5*  --   --   --  1.4* 2.1  --  1.9 1.7  CALCIUM 7.8*   < > 7.8*  --  8.9 9.0  --  8.3* 8.1*   < > = values in this interval not displayed.    --------------------------------------------------------------------------------------------------------------- Lab Results  Component Value Date   CHOL 96 01/27/2023   HDL 29 (L) 01/27/2023   LDLCALC 52 01/27/2023   LDLDIRECT 86.8 09/27/2012   TRIG 75 01/27/2023   CHOLHDL 3.3 01/27/2023    Lab Results  Component Value Date   HGBA1C 6.3 (H) 12/29/2022   No results for input(s): "TSH", "T4TOTAL", "FREET4", "T3FREE", "THYROIDAB" in the last 72 hours. No results for input(s): "VITAMINB12", "FOLATE", "FERRITIN", "TIBC", "IRON", "RETICCTPCT" in the last 72 hours. ------------------------------------------------------------------------------------------------------------------ Cardiac Enzymes No results for input(s): "CKMB", "TROPONINI", "MYOGLOBIN" in the last 168 hours.  Invalid input(s): "CK"  Micro Results No results found for this or any previous visit (from the past 240 hour(s)).  Radiology Reports DG Abd Portable 1V  Result Date: 02/01/2023 CLINICAL DATA:  67 year old male small-bowel obstruction. EXAM: PORTABLE ABDOMEN - 1 VIEW COMPARISON:  01/31/2023 and earlier. FINDINGS: Portable AP supine view at 0632 hours. Negative lung bases. IVC filter redemonstrated. Nonobstructed bowel gas pattern, mid abdominal small bowel loops with flocculated material not significantly changed from the CT on 01/28/2023. Extensive Aortoiliac calcified atherosclerosis. No acute osseous abnormality identified. IMPRESSION: Stable bowel gas pattern without evidence of acute bowel obstruction. Aortic Atherosclerosis (ICD10-I70.0). Electronically Signed   By: Odessa Fleming M.D.   On: 02/01/2023 07:41   VAS [place] UPPER EXTREMITY VENOUS DUPLEX  Result  Date: 01/31/2023 UPPER VENOUS STUDY  Patient Name:  [name] [name]  Date of Exam:   01/31/2023 Medical Rec #: [id number]      Accession #:    [id number] Date of Birth: [birth year]      Patient Gender: M Patient Age:   67 years Exam Location:  [facility] Procedure:      VAS [place] UPPER EXTREMITY VENOUS DUPLEX Referring Phys: Bess Harvest [facility] --------------------------------------------------------------------------------  Indications: Swelling Risk Factors: DVT Surgery 01/26/2023 - IVC filter. Limitations: Poor ultrasound/tissue interface and line. Comparison Study: 01/25/2023 - Bilateral lower extremity venous duplex  PROGRESS NOTE                                                                                                                                                                                                             Patient Demographics:    [name] [name], is a 67 y.o. male, DOB - Mar 02, 1956, ZOX:[id number]  Outpatient Primary MD for the patient is [name], MD    LOS - 3  Admit date - 01/29/2023    No chief complaint on file.      Brief Narrative (HPI from H&P)   67 y.o. male with medical history significant of HTN, HLD, who was in inpatient rehab who comes into the hospital with abdominal pain.  He recently had a prolonged and complicated hospitalization between 12/20/2022-01/24/2023.  For full hospitalization course please refer to the DC summary on 01/24/2023 by Dr. Lowell Guitar.  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  Vascular surgery was consulted and he was found to have an acute occlusion of the distal SMA as well as the inferior mesenteric artery.  General surgery was also consulted, and he was taken to the OR emergently, underwent an SMA stent and had resected the portion of his jejunum due to necrosis.  Hospital course complicated by multiple OR trips to check on bowel integrity with additional resections and reanastomoses x 2.  He also developed renal failure and had to be placed on dialysis.    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  Upon stability he was discharged to inpatient rehab, he was doing well up until yesterday when he felt sharp abdominal  pain, along with nausea and vomiting.    He was found to have concerns for small bowel obstruction, was seen by general surgery this morning and felt like he would benefit from being moved back to the acute care side.  Of note, while in rehab, he developed a DVT and underwent IVC filter placement.    Subjective:   Patient in bed, appears comfortable, denies any headache,  PROGRESS NOTE                                                                                                                                                                                                             Patient Demographics:    [name] [name], is a 67 y.o. male, DOB - Mar 02, 1956, ZOX:[id number]  Outpatient Primary MD for the patient is [name], MD    LOS - 3  Admit date - 01/29/2023    No chief complaint on file.      Brief Narrative (HPI from H&P)   67 y.o. male with medical history significant of HTN, HLD, who was in inpatient rehab who comes into the hospital with abdominal pain.  He recently had a prolonged and complicated hospitalization between 12/20/2022-01/24/2023.  For full hospitalization course please refer to the DC summary on 01/24/2023 by Dr. Lowell Guitar.  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  Vascular surgery was consulted and he was found to have an acute occlusion of the distal SMA as well as the inferior mesenteric artery.  General surgery was also consulted, and he was taken to the OR emergently, underwent an SMA stent and had resected the portion of his jejunum due to necrosis.  Hospital course complicated by multiple OR trips to check on bowel integrity with additional resections and reanastomoses x 2.  He also developed renal failure and had to be placed on dialysis.    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  Upon stability he was discharged to inpatient rehab, he was doing well up until yesterday when he felt sharp abdominal  pain, along with nausea and vomiting.    He was found to have concerns for small bowel obstruction, was seen by general surgery this morning and felt like he would benefit from being moved back to the acute care side.  Of note, while in rehab, he developed a DVT and underwent IVC filter placement.    Subjective:   Patient in bed, appears comfortable, denies any headache,  PROGRESS NOTE                                                                                                                                                                                                             Patient Demographics:    [name] [name], is a 67 y.o. male, DOB - Mar 02, 1956, ZOX:[id number]  Outpatient Primary MD for the patient is [name], MD    LOS - 3  Admit date - 01/29/2023    No chief complaint on file.      Brief Narrative (HPI from H&P)   67 y.o. male with medical history significant of HTN, HLD, who was in inpatient rehab who comes into the hospital with abdominal pain.  He recently had a prolonged and complicated hospitalization between 12/20/2022-01/24/2023.  For full hospitalization course please refer to the DC summary on 01/24/2023 by Dr. Lowell Guitar.  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  Vascular surgery was consulted and he was found to have an acute occlusion of the distal SMA as well as the inferior mesenteric artery.  General surgery was also consulted, and he was taken to the OR emergently, underwent an SMA stent and had resected the portion of his jejunum due to necrosis.  Hospital course complicated by multiple OR trips to check on bowel integrity with additional resections and reanastomoses x 2.  He also developed renal failure and had to be placed on dialysis.    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  Upon stability he was discharged to inpatient rehab, he was doing well up until yesterday when he felt sharp abdominal  pain, along with nausea and vomiting.    He was found to have concerns for small bowel obstruction, was seen by general surgery this morning and felt like he would benefit from being moved back to the acute care side.  Of note, while in rehab, he developed a DVT and underwent IVC filter placement.    Subjective:   Patient in bed, appears comfortable, denies any headache,  PROGRESS NOTE                                                                                                                                                                                                             Patient Demographics:    [name] [name], is a 67 y.o. male, DOB - Mar 02, 1956, ZOX:[id number]  Outpatient Primary MD for the patient is [name], MD    LOS - 3  Admit date - 01/29/2023    No chief complaint on file.      Brief Narrative (HPI from H&P)   67 y.o. male with medical history significant of HTN, HLD, who was in inpatient rehab who comes into the hospital with abdominal pain.  He recently had a prolonged and complicated hospitalization between 12/20/2022-01/24/2023.  For full hospitalization course please refer to the DC summary on 01/24/2023 by Dr. Lowell Guitar.  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  Vascular surgery was consulted and he was found to have an acute occlusion of the distal SMA as well as the inferior mesenteric artery.  General surgery was also consulted, and he was taken to the OR emergently, underwent an SMA stent and had resected the portion of his jejunum due to necrosis.  Hospital course complicated by multiple OR trips to check on bowel integrity with additional resections and reanastomoses x 2.  He also developed renal failure and had to be placed on dialysis.    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  Upon stability he was discharged to inpatient rehab, he was doing well up until yesterday when he felt sharp abdominal  pain, along with nausea and vomiting.    He was found to have concerns for small bowel obstruction, was seen by general surgery this morning and felt like he would benefit from being moved back to the acute care side.  Of note, while in rehab, he developed a DVT and underwent IVC filter placement.    Subjective:   Patient in bed, appears comfortable, denies any headache,  BNP  --   --   --   --   --  253.6*  --  194.8* 230.8*  MG 1.5*  --   --   --  1.4* 2.1  --  1.9 1.7  CALCIUM 7.8* 7.8* 7.8*  --  8.9 9.0  --  8.3* 8.1*      Recent Labs  Lab 01/27/23 0735 01/28/23 0621 01/28/23 1951 01/28/23 2037 01/29/23 0548 01/30/23 0340 01/30/23 0744 01/31/23 0347 02/01/23 0611  CRP  --   --   --   --   --   --  20.0* 22.1* 18.0*  PROCALCITON  --   --   --   --   --   --  1.15 1.02 0.88  LATICACIDVEN  --   --  0.9 0.8  --   --   --   --    --   INR  --   --   --   --   --   --  1.2  --   --   BNP  --   --   --   --   --  253.6*  --  194.8* 230.8*  MG 1.5*  --   --   --  1.4* 2.1  --  1.9 1.7  CALCIUM 7.8*   < > 7.8*  --  8.9 9.0  --  8.3* 8.1*   < > = values in this interval not displayed.    --------------------------------------------------------------------------------------------------------------- Lab Results  Component Value Date   CHOL 96 01/27/2023   HDL 29 (L) 01/27/2023   LDLCALC 52 01/27/2023   LDLDIRECT 86.8 09/27/2012   TRIG 75 01/27/2023   CHOLHDL 3.3 01/27/2023    Lab Results  Component Value Date   HGBA1C 6.3 (H) 12/29/2022   No results for input(s): "TSH", "T4TOTAL", "FREET4", "T3FREE", "THYROIDAB" in the last 72 hours. No results for input(s): "VITAMINB12", "FOLATE", "FERRITIN", "TIBC", "IRON", "RETICCTPCT" in the last 72 hours. ------------------------------------------------------------------------------------------------------------------ Cardiac Enzymes No results for input(s): "CKMB", "TROPONINI", "MYOGLOBIN" in the last 168 hours.  Invalid input(s): "CK"  Micro Results No results found for this or any previous visit (from the past 240 hour(s)).  Radiology Reports DG Abd Portable 1V  Result Date: 02/01/2023 CLINICAL DATA:  67 year old male small-bowel obstruction. EXAM: PORTABLE ABDOMEN - 1 VIEW COMPARISON:  01/31/2023 and earlier. FINDINGS: Portable AP supine view at 0632 hours. Negative lung bases. IVC filter redemonstrated. Nonobstructed bowel gas pattern, mid abdominal small bowel loops with flocculated material not significantly changed from the CT on 01/28/2023. Extensive Aortoiliac calcified atherosclerosis. No acute osseous abnormality identified. IMPRESSION: Stable bowel gas pattern without evidence of acute bowel obstruction. Aortic Atherosclerosis (ICD10-I70.0). Electronically Signed   By: Odessa Fleming M.D.   On: 02/01/2023 07:41   VAS [place] UPPER EXTREMITY VENOUS DUPLEX  Result  Date: 01/31/2023 UPPER VENOUS STUDY  Patient Name:  [name] [name]  Date of Exam:   01/31/2023 Medical Rec #: [id number]      Accession #:    [id number] Date of Birth: [birth year]      Patient Gender: M Patient Age:   67 years Exam Location:  [facility] Procedure:      VAS [place] UPPER EXTREMITY VENOUS DUPLEX Referring Phys: Bess Harvest [facility] --------------------------------------------------------------------------------  Indications: Swelling Risk Factors: DVT Surgery 01/26/2023 - IVC filter. Limitations: Poor ultrasound/tissue interface and line. Comparison Study: 01/25/2023 - Bilateral lower extremity venous duplex  BNP  --   --   --   --   --  253.6*  --  194.8* 230.8*  MG 1.5*  --   --   --  1.4* 2.1  --  1.9 1.7  CALCIUM 7.8* 7.8* 7.8*  --  8.9 9.0  --  8.3* 8.1*      Recent Labs  Lab 01/27/23 0735 01/28/23 0621 01/28/23 1951 01/28/23 2037 01/29/23 0548 01/30/23 0340 01/30/23 0744 01/31/23 0347 02/01/23 0611  CRP  --   --   --   --   --   --  20.0* 22.1* 18.0*  PROCALCITON  --   --   --   --   --   --  1.15 1.02 0.88  LATICACIDVEN  --   --  0.9 0.8  --   --   --   --    --   INR  --   --   --   --   --   --  1.2  --   --   BNP  --   --   --   --   --  253.6*  --  194.8* 230.8*  MG 1.5*  --   --   --  1.4* 2.1  --  1.9 1.7  CALCIUM 7.8*   < > 7.8*  --  8.9 9.0  --  8.3* 8.1*   < > = values in this interval not displayed.    --------------------------------------------------------------------------------------------------------------- Lab Results  Component Value Date   CHOL 96 01/27/2023   HDL 29 (L) 01/27/2023   LDLCALC 52 01/27/2023   LDLDIRECT 86.8 09/27/2012   TRIG 75 01/27/2023   CHOLHDL 3.3 01/27/2023    Lab Results  Component Value Date   HGBA1C 6.3 (H) 12/29/2022   No results for input(s): "TSH", "T4TOTAL", "FREET4", "T3FREE", "THYROIDAB" in the last 72 hours. No results for input(s): "VITAMINB12", "FOLATE", "FERRITIN", "TIBC", "IRON", "RETICCTPCT" in the last 72 hours. ------------------------------------------------------------------------------------------------------------------ Cardiac Enzymes No results for input(s): "CKMB", "TROPONINI", "MYOGLOBIN" in the last 168 hours.  Invalid input(s): "CK"  Micro Results No results found for this or any previous visit (from the past 240 hour(s)).  Radiology Reports DG Abd Portable 1V  Result Date: 02/01/2023 CLINICAL DATA:  67 year old male small-bowel obstruction. EXAM: PORTABLE ABDOMEN - 1 VIEW COMPARISON:  01/31/2023 and earlier. FINDINGS: Portable AP supine view at 0632 hours. Negative lung bases. IVC filter redemonstrated. Nonobstructed bowel gas pattern, mid abdominal small bowel loops with flocculated material not significantly changed from the CT on 01/28/2023. Extensive Aortoiliac calcified atherosclerosis. No acute osseous abnormality identified. IMPRESSION: Stable bowel gas pattern without evidence of acute bowel obstruction. Aortic Atherosclerosis (ICD10-I70.0). Electronically Signed   By: Odessa Fleming M.D.   On: 02/01/2023 07:41   VAS [place] UPPER EXTREMITY VENOUS DUPLEX  Result  Date: 01/31/2023 UPPER VENOUS STUDY  Patient Name:  [name] [name]  Date of Exam:   01/31/2023 Medical Rec #: [id number]      Accession #:    [id number] Date of Birth: [birth year]      Patient Gender: M Patient Age:   67 years Exam Location:  [facility] Procedure:      VAS [place] UPPER EXTREMITY VENOUS DUPLEX Referring Phys: Bess Harvest [facility] --------------------------------------------------------------------------------  Indications: Swelling Risk Factors: DVT Surgery 01/26/2023 - IVC filter. Limitations: Poor ultrasound/tissue interface and line. Comparison Study: 01/25/2023 - Bilateral lower extremity venous duplex  PROGRESS NOTE                                                                                                                                                                                                             Patient Demographics:    [name] [name], is a 67 y.o. male, DOB - Mar 02, 1956, ZOX:[id number]  Outpatient Primary MD for the patient is [name], MD    LOS - 3  Admit date - 01/29/2023    No chief complaint on file.      Brief Narrative (HPI from H&P)   67 y.o. male with medical history significant of HTN, HLD, who was in inpatient rehab who comes into the hospital with abdominal pain.  He recently had a prolonged and complicated hospitalization between 12/20/2022-01/24/2023.  For full hospitalization course please refer to the DC summary on 01/24/2023 by Dr. Lowell Guitar.  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  Vascular surgery was consulted and he was found to have an acute occlusion of the distal SMA as well as the inferior mesenteric artery.  General surgery was also consulted, and he was taken to the OR emergently, underwent an SMA stent and had resected the portion of his jejunum due to necrosis.  Hospital course complicated by multiple OR trips to check on bowel integrity with additional resections and reanastomoses x 2.  He also developed renal failure and had to be placed on dialysis.    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  Upon stability he was discharged to inpatient rehab, he was doing well up until yesterday when he felt sharp abdominal  pain, along with nausea and vomiting.    He was found to have concerns for small bowel obstruction, was seen by general surgery this morning and felt like he would benefit from being moved back to the acute care side.  Of note, while in rehab, he developed a DVT and underwent IVC filter placement.    Subjective:   Patient in bed, appears comfortable, denies any headache,

## 2023-02-01 NOTE — Plan of Care (Signed)
  Problem: Education: Goal: Knowledge of General Education information will improve Description Including pain rating scale, medication(s)/side effects and non-pharmacologic comfort measures Outcome: Progressing   Problem: Clinical Measurements: Goal: Ability to maintain clinical measurements within normal limits will improve Outcome: Progressing Goal: Will remain free from infection Outcome: Progressing   Problem: Coping: Goal: Level of anxiety will decrease Outcome: Progressing   Problem: Elimination: Goal: Will not experience complications related to bowel motility Outcome: Progressing   Problem: Safety: Goal: Ability to remain free from injury will improve Outcome: Progressing   Problem: Skin Integrity: Goal: Risk for impaired skin integrity will decrease Outcome: Progressing

## 2023-02-01 NOTE — Progress Notes (Signed)
OT Cancellation Note  Patient Details Name: [name] [name] MRN: [id number] DOB: 09-Aug-1955   Cancelled Treatment:    Reason Eval/Treat Not Completed: Fatigue/lethargy limiting ability to participate (Attempted to see pt in the PM, pt sound asleep not arousable. Family member in the room explaining he recently had pain meds due to significant abdominal pain. OT will follow-up with pt as able)  02/01/2023  AB, OTR/L  Acute Rehabilitation Services  Office: [phone]   Tristan Schroeder 02/01/2023, 5:29 PM

## 2023-02-02 ENCOUNTER — Other Ambulatory Visit: Payer: Self-pay | Admitting: Family Medicine

## 2023-02-02 DIAGNOSIS — K56609 Unspecified intestinal obstruction, unspecified as to partial versus complete obstruction: Secondary | ICD-10-CM | POA: Diagnosis not present

## 2023-02-02 LAB — CBC WITH DIFFERENTIAL/PLATELET
Abs Immature Granulocytes: 0.32 10*3/uL — ABNORMAL HIGH (ref 0.00–0.07)
Basophils Absolute: 0 10*3/uL (ref 0.0–0.1)
Basophils Relative: 0 %
Eosinophils Absolute: 0 10*3/uL (ref 0.0–0.5)
Eosinophils Relative: 0 %
HCT: 28.1 % — ABNORMAL LOW (ref 39.0–52.0)
Hemoglobin: 9.5 g/dL — ABNORMAL LOW (ref 13.0–17.0)
Immature Granulocytes: 3 %
Lymphocytes Relative: 2 %
Lymphs Abs: 0.3 10*3/uL — ABNORMAL LOW (ref 0.7–4.0)
MCH: 30 pg (ref 26.0–34.0)
MCHC: 33.8 g/dL (ref 30.0–36.0)
MCV: 88.6 fL (ref 80.0–100.0)
Monocytes Absolute: 1 10*3/uL (ref 0.1–1.0)
Monocytes Relative: 8 %
Neutro Abs: 11.1 10*3/uL — ABNORMAL HIGH (ref 1.7–7.7)
Neutrophils Relative %: 87 %
Platelets: 189 10*3/uL (ref 150–400)
RBC: 3.17 MIL/uL — ABNORMAL LOW (ref 4.22–5.81)
RDW: 14.6 % (ref 11.5–15.5)
WBC: 12.8 10*3/uL — ABNORMAL HIGH (ref 4.0–10.5)
nRBC: 0 % (ref 0.0–0.2)

## 2023-02-02 LAB — BASIC METABOLIC PANEL
Anion gap: 7 (ref 5–15)
BUN: 30 mg/dL — ABNORMAL HIGH (ref 8–23)
CO2: 21 mmol/L — ABNORMAL LOW (ref 22–32)
Calcium: 8 mg/dL — ABNORMAL LOW (ref 8.9–10.3)
Chloride: 107 mmol/L (ref 98–111)
Creatinine, Ser: 1.6 mg/dL — ABNORMAL HIGH (ref 0.61–1.24)
GFR, Estimated: 47 mL/min — ABNORMAL LOW (ref >60)
Glucose, Bld: 108 mg/dL — ABNORMAL HIGH (ref 70–99)
Potassium: 3.9 mmol/L (ref 3.5–5.1)
Sodium: 135 mmol/L (ref 135–145)

## 2023-02-02 LAB — BRAIN NATRIURETIC PEPTIDE: B Natriuretic Peptide: 514.1 pg/mL — ABNORMAL HIGH (ref 0.0–100.0)

## 2023-02-02 LAB — PROCALCITONIN: Procalcitonin: 0.87 ng/mL

## 2023-02-02 LAB — PHOSPHORUS: Phosphorus: 2.8 mg/dL (ref 2.5–4.6)

## 2023-02-02 LAB — MAGNESIUM: Magnesium: 1.7 mg/dL (ref 1.7–2.4)

## 2023-02-02 LAB — C-REACTIVE PROTEIN: CRP: 15.1 mg/dL — ABNORMAL HIGH (ref <1.0)

## 2023-02-02 MED ORDER — DEXTROSE-SODIUM CHLORIDE 5-0.45 % IV SOLN: INTRAVENOUS | Status: AC

## 2023-02-02 MED ORDER — AMLODIPINE BESYLATE 5 MG PO TABS
5.0000 mg | ORAL_TABLET | Freq: Every day | ORAL | Status: DC
  Administered 2023-02-02 – 2023-02-03 (×2): 5 mg via ORAL
  Filled 2023-02-02 (×2): qty 1

## 2023-02-02 MED ORDER — ENSURE ENLIVE PO LIQD
237.0000 mL | Freq: Two times a day (BID) | ORAL | Status: DC
  Administered 2023-02-02: 237 mL via ORAL

## 2023-02-02 NOTE — Plan of Care (Signed)

## 2023-02-02 NOTE — Progress Notes (Signed)
Subjective/Chief Complaint: Much better, loose stool via flexi, no ab pain, no n/v   Objective: Vital signs in last 24 hours: Temp:  [97.9 F (36.6 C)-98.3 F (36.8 C)] 97.9 F (36.6 C) (10/25 0800) Pulse Rate:  [65-85] 81 (10/25 0800) Resp:  [18-27] 19 (10/25 0800) BP: (149-181)/(68-85) 161/85 (10/25 0800) SpO2:  [93 %-97 %] 97 % (10/25 0800) Weight:  [865 kg] 108 kg (10/25 0500) Last BM Date : 01/31/23  Intake/Output from previous day: 10/24 0701 - 10/25 0700 In: 575.9 [I.V.:575.9] Out: 700 [Urine:700] Intake/Output this shift: No intake/output data recorded.  Ab mild distended, nontender  Lab Results:  Recent Labs    02/01/23 2034 02/02/23 0343  WBC 13.7* 12.8*  HGB 9.2* 9.5*  HCT 27.5* 28.1*  PLT 190 189   BMET Recent Labs    02/01/23 0611 02/02/23 0343  NA 136 135  K 3.1* 3.9  CL 107 107  CO2 23 21*  GLUCOSE 111* 108*  BUN 33* 30*  CREATININE 1.65* 1.60*  CALCIUM 8.1* 8.0*   PT/INR No results for input(s): "LABPROT", "INR" in the last 72 hours. ABG No results for input(s): "PHART", "HCO3" in the last 72 hours.  Invalid input(s): "PCO2", "PO2"  Studies/Results: DG Abd 1 View  Result Date: 02/01/2023 CLINICAL DATA:  Follow up small bowel dilatation EXAM: ABDOMEN - 1 VIEW COMPARISON:  02/01/2023 FINDINGS: Scattered large and small bowel gas is noted. No obstructive changes are seen. IVC filter is noted in place. No free air is seen. Degenerative changes of lumbar spine are noted. IMPRESSION: No obstructive changes seen. Electronically Signed   By: Alcide Clever M.D.   On: 02/01/2023 20:22   DG Abd Portable 1V  Result Date: 02/01/2023 CLINICAL DATA:  67 year old male small-bowel obstruction. EXAM: PORTABLE ABDOMEN - 1 VIEW COMPARISON:  01/31/2023 and earlier. FINDINGS: Portable AP supine view at 0632 hours. Negative lung bases. IVC filter redemonstrated. Nonobstructed bowel gas pattern, mid abdominal small bowel loops with flocculated material  not significantly changed from the CT on 01/28/2023. Extensive Aortoiliac calcified atherosclerosis. No acute osseous abnormality identified. IMPRESSION: Stable bowel gas pattern without evidence of acute bowel obstruction. Aortic Atherosclerosis (ICD10-I70.0). Electronically Signed   By: Odessa Fleming M.D.   On: 02/01/2023 07:41   VAS [place] UPPER EXTREMITY VENOUS DUPLEX  Result Date: 01/31/2023 UPPER VENOUS STUDY  Patient Name:  [name] [name]  Date of Exam:   01/31/2023 Medical Rec #: [id number]      Accession #:    [id number] Date of Birth: 10/15/1955      Patient Gender: M Patient Age:   64 years Exam Location:  [facility] Procedure:      VAS [place] UPPER EXTREMITY VENOUS DUPLEX Referring Phys: Bess Harvest [facility] --------------------------------------------------------------------------------  Indications: Swelling Risk Factors: DVT Surgery 01/26/2023 - IVC filter. Limitations: Poor ultrasound/tissue interface and line. Comparison Study: 01/25/2023 - Bilateral lower extremity venous duplex                   Acute deep venous thrombosis of right popliteal vein, right                   peroneal vein,                   left peroneal vein, left soleal vein, and left gastrocnemius                   vein. Please  see table above for more detail. Performing Technologist: Chanda Busing RVT  Examination Guidelines: A complete evaluation includes B-mode imaging, spectral Doppler, color Doppler, and power Doppler as needed of all accessible portions of each vessel. Bilateral testing is considered an integral part of a complete examination. Limited examinations for reoccurring indications may be performed as noted.  Right Findings: +----------+------------+---------+-----------+----------+-------+ RIGHT     CompressiblePhasicitySpontaneousPropertiesSummary +----------+------------+---------+-----------+----------+-------+ IJV           Full       Yes       Yes                       +----------+------------+---------+-----------+----------+-------+ Subclavian    Full       Yes       Yes                      +----------+------------+---------+-----------+----------+-------+ Axillary      Full       Yes       Yes                      +----------+------------+---------+-----------+----------+-------+ Brachial      Full                                          +----------+------------+---------+-----------+----------+-------+ Radial        Full                                          +----------+------------+---------+-----------+----------+-------+ Ulnar         Full                                          +----------+------------+---------+-----------+----------+-------+ Cephalic      None                                   Acute  +----------+------------+---------+-----------+----------+-------+ Basilic       Full                                          +----------+------------+---------+-----------+----------+-------+ Superficial thrombus detected in the cephalic vein is noted to be in the mid forearm.  Left Findings: +----------+------------+---------+-----------+----------+-------+ LEFT      CompressiblePhasicitySpontaneousPropertiesSummary +----------+------------+---------+-----------+----------+-------+ IJV           Full       Yes       Yes                      +----------+------------+---------+-----------+----------+-------+ Subclavian    Full       Yes       Yes                      +----------+------------+---------+-----------+----------+-------+ Axillary      Full       Yes       Yes                      +----------+------------+---------+-----------+----------+-------+  Brachial      Full                                          +----------+------------+---------+-----------+----------+-------+ Radial        Full                                           +----------+------------+---------+-----------+----------+-------+ Ulnar         Full                                          +----------+------------+---------+-----------+----------+-------+ Cephalic      Full                                          +----------+------------+---------+-----------+----------+-------+ Basilic       Full                                          +----------+------------+---------+-----------+----------+-------+  Summary:  Right: No evidence of deep vein thrombosis in the upper extremity. Findings consistent with acute superficial vein thrombosis involving the right cephalic vein. Superficial thrombus detected in the cephalic vein is noted to be in the mid forearm.  Left: No evidence of deep vein thrombosis in the upper extremity. No evidence of superficial vein thrombosis in the upper extremity.  *See table(s) above for measurements and observations.  Diagnosing physician: Coral Else MD Electronically signed by Coral Else MD on 01/31/2023 at 6:24:36 PM.    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 Karin Lieu, exploratory laparotomy and small bowel resection Dr. Janee Morn -S/p re-exploration, replace abthera FB 12/23/22   -S/p re-exploration, small bowel resection with anastomosis x2, assessment of bowel perfusion with ICG, abdominal closure 9/16 Dr. Andrey Campanile - I think fulls and soft diet too much for what is now really chronic mesenteric ischemia -will do clears and supplements. May need to stay at this -I think reasonable to also consider starting antiplatelet therapy again  -has a lot of loose stool with wbc- consider c diff per [facility]   ID: none currently FEN: NPO VTE: SCDs  Emelia Loron 02/02/2023

## 2023-02-02 NOTE — Progress Notes (Signed)
FINDINGS: Portable AP supine view at 0632 hours. Negative lung bases. IVC filter redemonstrated. Nonobstructed bowel gas pattern, mid abdominal small bowel loops with flocculated material not significantly changed from the CT on 01/28/2023. Extensive Aortoiliac calcified atherosclerosis. No acute osseous abnormality identified. IMPRESSION: Stable bowel gas pattern without evidence of acute bowel obstruction. Aortic Atherosclerosis (ICD10-I70.0). Electronically Signed   By: Odessa Fleming M.D.   On: 02/01/2023 07:41   VAS [place] UPPER EXTREMITY VENOUS DUPLEX  Result Date: 01/31/2023 UPPER VENOUS STUDY  Patient Name:  [name] [name]  Date of Exam:   01/31/2023 Medical Rec #: [id number]      Accession #:    [id number] Date of Birth: [birth year]      Patient Gender: M Patient Age:   67 years Exam Location:  [facility] Procedure:      VAS [place] UPPER EXTREMITY VENOUS DUPLEX Referring Phys: Bess Harvest [facility] --------------------------------------------------------------------------------  Indications: Swelling Risk Factors: DVT Surgery 01/26/2023 - IVC filter. Limitations: Poor ultrasound/tissue interface and line. Comparison Study: 01/25/2023 - Bilateral lower extremity venous duplex                   Acute deep venous thrombosis of right popliteal vein, right                   peroneal vein,                   left peroneal vein, left soleal vein, and left gastrocnemius                   vein. Please                   see table above for more detail. Performing Technologist: Chanda Busing RVT  Examination Guidelines: A complete evaluation includes B-mode imaging, spectral Doppler, color Doppler, and power Doppler as needed of all accessible portions of each vessel. Bilateral testing  is considered an integral part of a complete examination. Limited examinations for reoccurring indications may be performed as noted.  Right Findings: +----------+------------+---------+-----------+----------+-------+ RIGHT     CompressiblePhasicitySpontaneousPropertiesSummary +----------+------------+---------+-----------+----------+-------+ IJV           Full       Yes       Yes                      +----------+------------+---------+-----------+----------+-------+ Subclavian    Full       Yes       Yes                      +----------+------------+---------+-----------+----------+-------+ Axillary      Full       Yes       Yes                      +----------+------------+---------+-----------+----------+-------+ Brachial      Full                                          +----------+------------+---------+-----------+----------+-------+ Radial        Full                                          +----------+------------+---------+-----------+----------+-------+ Ulnar  PROGRESS NOTE                                                                                                                                                                                                             Patient Demographics:    [name] [name], is a 67 y.o. male, DOB - 02-19-1956, NGE:[id number]  Outpatient Primary MD for the patient is [name], MD    LOS - 4  Admit date - 01/29/2023    No chief complaint on file.      Brief Narrative (HPI from H&P)   67 y.o. male with medical history significant of HTN, HLD, who was in inpatient rehab who comes into the hospital with abdominal pain.  He recently had a prolonged and complicated hospitalization between 12/20/2022-01/24/2023.  For full hospitalization course please refer to the DC summary on 01/24/2023 by Dr. Lowell Guitar.  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  Vascular surgery was consulted and he was found to have an acute occlusion of the distal SMA as well as the inferior mesenteric artery.  General surgery was also consulted, and he was taken to the OR emergently, underwent an SMA stent and had resected the portion of his jejunum due to necrosis.  Hospital course complicated by multiple OR trips to check on bowel integrity with additional resections and reanastomoses x 2.  He also developed renal failure and had to be placed on dialysis.    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  Upon stability he was discharged to inpatient rehab, he was doing well up until yesterday when he felt sharp abdominal  pain, along with nausea and vomiting.    He was found to have concerns for small bowel obstruction, was seen by general surgery this morning and felt like he would benefit from being moved back to the acute care side.  Of note, while in rehab, he developed a DVT and underwent IVC filter placement.    Subjective:   Patient in bed, appears comfortable, denies any headache,  FINDINGS: Portable AP supine view at 0632 hours. Negative lung bases. IVC filter redemonstrated. Nonobstructed bowel gas pattern, mid abdominal small bowel loops with flocculated material not significantly changed from the CT on 01/28/2023. Extensive Aortoiliac calcified atherosclerosis. No acute osseous abnormality identified. IMPRESSION: Stable bowel gas pattern without evidence of acute bowel obstruction. Aortic Atherosclerosis (ICD10-I70.0). Electronically Signed   By: Odessa Fleming M.D.   On: 02/01/2023 07:41   VAS [place] UPPER EXTREMITY VENOUS DUPLEX  Result Date: 01/31/2023 UPPER VENOUS STUDY  Patient Name:  [name] [name]  Date of Exam:   01/31/2023 Medical Rec #: [id number]      Accession #:    [id number] Date of Birth: [birth year]      Patient Gender: M Patient Age:   67 years Exam Location:  [facility] Procedure:      VAS [place] UPPER EXTREMITY VENOUS DUPLEX Referring Phys: Bess Harvest [facility] --------------------------------------------------------------------------------  Indications: Swelling Risk Factors: DVT Surgery 01/26/2023 - IVC filter. Limitations: Poor ultrasound/tissue interface and line. Comparison Study: 01/25/2023 - Bilateral lower extremity venous duplex                   Acute deep venous thrombosis of right popliteal vein, right                   peroneal vein,                   left peroneal vein, left soleal vein, and left gastrocnemius                   vein. Please                   see table above for more detail. Performing Technologist: Chanda Busing RVT  Examination Guidelines: A complete evaluation includes B-mode imaging, spectral Doppler, color Doppler, and power Doppler as needed of all accessible portions of each vessel. Bilateral testing  is considered an integral part of a complete examination. Limited examinations for reoccurring indications may be performed as noted.  Right Findings: +----------+------------+---------+-----------+----------+-------+ RIGHT     CompressiblePhasicitySpontaneousPropertiesSummary +----------+------------+---------+-----------+----------+-------+ IJV           Full       Yes       Yes                      +----------+------------+---------+-----------+----------+-------+ Subclavian    Full       Yes       Yes                      +----------+------------+---------+-----------+----------+-------+ Axillary      Full       Yes       Yes                      +----------+------------+---------+-----------+----------+-------+ Brachial      Full                                          +----------+------------+---------+-----------+----------+-------+ Radial        Full                                          +----------+------------+---------+-----------+----------+-------+ Ulnar  FINDINGS: Portable AP supine view at 0632 hours. Negative lung bases. IVC filter redemonstrated. Nonobstructed bowel gas pattern, mid abdominal small bowel loops with flocculated material not significantly changed from the CT on 01/28/2023. Extensive Aortoiliac calcified atherosclerosis. No acute osseous abnormality identified. IMPRESSION: Stable bowel gas pattern without evidence of acute bowel obstruction. Aortic Atherosclerosis (ICD10-I70.0). Electronically Signed   By: Odessa Fleming M.D.   On: 02/01/2023 07:41   VAS [place] UPPER EXTREMITY VENOUS DUPLEX  Result Date: 01/31/2023 UPPER VENOUS STUDY  Patient Name:  [name] [name]  Date of Exam:   01/31/2023 Medical Rec #: [id number]      Accession #:    [id number] Date of Birth: [birth year]      Patient Gender: M Patient Age:   67 years Exam Location:  [facility] Procedure:      VAS [place] UPPER EXTREMITY VENOUS DUPLEX Referring Phys: Bess Harvest [facility] --------------------------------------------------------------------------------  Indications: Swelling Risk Factors: DVT Surgery 01/26/2023 - IVC filter. Limitations: Poor ultrasound/tissue interface and line. Comparison Study: 01/25/2023 - Bilateral lower extremity venous duplex                   Acute deep venous thrombosis of right popliteal vein, right                   peroneal vein,                   left peroneal vein, left soleal vein, and left gastrocnemius                   vein. Please                   see table above for more detail. Performing Technologist: Chanda Busing RVT  Examination Guidelines: A complete evaluation includes B-mode imaging, spectral Doppler, color Doppler, and power Doppler as needed of all accessible portions of each vessel. Bilateral testing  is considered an integral part of a complete examination. Limited examinations for reoccurring indications may be performed as noted.  Right Findings: +----------+------------+---------+-----------+----------+-------+ RIGHT     CompressiblePhasicitySpontaneousPropertiesSummary +----------+------------+---------+-----------+----------+-------+ IJV           Full       Yes       Yes                      +----------+------------+---------+-----------+----------+-------+ Subclavian    Full       Yes       Yes                      +----------+------------+---------+-----------+----------+-------+ Axillary      Full       Yes       Yes                      +----------+------------+---------+-----------+----------+-------+ Brachial      Full                                          +----------+------------+---------+-----------+----------+-------+ Radial        Full                                          +----------+------------+---------+-----------+----------+-------+ Ulnar  PROGRESS NOTE                                                                                                                                                                                                             Patient Demographics:    [name] [name], is a 67 y.o. male, DOB - 02-19-1956, NGE:[id number]  Outpatient Primary MD for the patient is [name], MD    LOS - 4  Admit date - 01/29/2023    No chief complaint on file.      Brief Narrative (HPI from H&P)   67 y.o. male with medical history significant of HTN, HLD, who was in inpatient rehab who comes into the hospital with abdominal pain.  He recently had a prolonged and complicated hospitalization between 12/20/2022-01/24/2023.  For full hospitalization course please refer to the DC summary on 01/24/2023 by Dr. Lowell Guitar.  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  Vascular surgery was consulted and he was found to have an acute occlusion of the distal SMA as well as the inferior mesenteric artery.  General surgery was also consulted, and he was taken to the OR emergently, underwent an SMA stent and had resected the portion of his jejunum due to necrosis.  Hospital course complicated by multiple OR trips to check on bowel integrity with additional resections and reanastomoses x 2.  He also developed renal failure and had to be placed on dialysis.    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  Upon stability he was discharged to inpatient rehab, he was doing well up until yesterday when he felt sharp abdominal  pain, along with nausea and vomiting.    He was found to have concerns for small bowel obstruction, was seen by general surgery this morning and felt like he would benefit from being moved back to the acute care side.  Of note, while in rehab, he developed a DVT and underwent IVC filter placement.    Subjective:   Patient in bed, appears comfortable, denies any headache,  FINDINGS: Portable AP supine view at 0632 hours. Negative lung bases. IVC filter redemonstrated. Nonobstructed bowel gas pattern, mid abdominal small bowel loops with flocculated material not significantly changed from the CT on 01/28/2023. Extensive Aortoiliac calcified atherosclerosis. No acute osseous abnormality identified. IMPRESSION: Stable bowel gas pattern without evidence of acute bowel obstruction. Aortic Atherosclerosis (ICD10-I70.0). Electronically Signed   By: Odessa Fleming M.D.   On: 02/01/2023 07:41   VAS [place] UPPER EXTREMITY VENOUS DUPLEX  Result Date: 01/31/2023 UPPER VENOUS STUDY  Patient Name:  [name] [name]  Date of Exam:   01/31/2023 Medical Rec #: [id number]      Accession #:    [id number] Date of Birth: [birth year]      Patient Gender: M Patient Age:   67 years Exam Location:  [facility] Procedure:      VAS [place] UPPER EXTREMITY VENOUS DUPLEX Referring Phys: Bess Harvest [facility] --------------------------------------------------------------------------------  Indications: Swelling Risk Factors: DVT Surgery 01/26/2023 - IVC filter. Limitations: Poor ultrasound/tissue interface and line. Comparison Study: 01/25/2023 - Bilateral lower extremity venous duplex                   Acute deep venous thrombosis of right popliteal vein, right                   peroneal vein,                   left peroneal vein, left soleal vein, and left gastrocnemius                   vein. Please                   see table above for more detail. Performing Technologist: Chanda Busing RVT  Examination Guidelines: A complete evaluation includes B-mode imaging, spectral Doppler, color Doppler, and power Doppler as needed of all accessible portions of each vessel. Bilateral testing  is considered an integral part of a complete examination. Limited examinations for reoccurring indications may be performed as noted.  Right Findings: +----------+------------+---------+-----------+----------+-------+ RIGHT     CompressiblePhasicitySpontaneousPropertiesSummary +----------+------------+---------+-----------+----------+-------+ IJV           Full       Yes       Yes                      +----------+------------+---------+-----------+----------+-------+ Subclavian    Full       Yes       Yes                      +----------+------------+---------+-----------+----------+-------+ Axillary      Full       Yes       Yes                      +----------+------------+---------+-----------+----------+-------+ Brachial      Full                                          +----------+------------+---------+-----------+----------+-------+ Radial        Full                                          +----------+------------+---------+-----------+----------+-------+ Ulnar  PROGRESS NOTE                                                                                                                                                                                                             Patient Demographics:    [name] [name], is a 67 y.o. male, DOB - 02-19-1956, NGE:[id number]  Outpatient Primary MD for the patient is [name], MD    LOS - 4  Admit date - 01/29/2023    No chief complaint on file.      Brief Narrative (HPI from H&P)   67 y.o. male with medical history significant of HTN, HLD, who was in inpatient rehab who comes into the hospital with abdominal pain.  He recently had a prolonged and complicated hospitalization between 12/20/2022-01/24/2023.  For full hospitalization course please refer to the DC summary on 01/24/2023 by Dr. Lowell Guitar.  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  Vascular surgery was consulted and he was found to have an acute occlusion of the distal SMA as well as the inferior mesenteric artery.  General surgery was also consulted, and he was taken to the OR emergently, underwent an SMA stent and had resected the portion of his jejunum due to necrosis.  Hospital course complicated by multiple OR trips to check on bowel integrity with additional resections and reanastomoses x 2.  He also developed renal failure and had to be placed on dialysis.    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  Upon stability he was discharged to inpatient rehab, he was doing well up until yesterday when he felt sharp abdominal  pain, along with nausea and vomiting.    He was found to have concerns for small bowel obstruction, was seen by general surgery this morning and felt like he would benefit from being moved back to the acute care side.  Of note, while in rehab, he developed a DVT and underwent IVC filter placement.    Subjective:   Patient in bed, appears comfortable, denies any headache,  PROGRESS NOTE                                                                                                                                                                                                             Patient Demographics:    [name] [name], is a 67 y.o. male, DOB - 02-19-1956, NGE:[id number]  Outpatient Primary MD for the patient is [name], MD    LOS - 4  Admit date - 01/29/2023    No chief complaint on file.      Brief Narrative (HPI from H&P)   67 y.o. male with medical history significant of HTN, HLD, who was in inpatient rehab who comes into the hospital with abdominal pain.  He recently had a prolonged and complicated hospitalization between 12/20/2022-01/24/2023.  For full hospitalization course please refer to the DC summary on 01/24/2023 by Dr. Lowell Guitar.  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  Vascular surgery was consulted and he was found to have an acute occlusion of the distal SMA as well as the inferior mesenteric artery.  General surgery was also consulted, and he was taken to the OR emergently, underwent an SMA stent and had resected the portion of his jejunum due to necrosis.  Hospital course complicated by multiple OR trips to check on bowel integrity with additional resections and reanastomoses x 2.  He also developed renal failure and had to be placed on dialysis.    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  Upon stability he was discharged to inpatient rehab, he was doing well up until yesterday when he felt sharp abdominal  pain, along with nausea and vomiting.    He was found to have concerns for small bowel obstruction, was seen by general surgery this morning and felt like he would benefit from being moved back to the acute care side.  Of note, while in rehab, he developed a DVT and underwent IVC filter placement.    Subjective:   Patient in bed, appears comfortable, denies any headache,

## 2023-02-02 NOTE — Progress Notes (Signed)
Physical Therapy Treatment Patient Details Name: [name] [name] MRN: [id number] DOB: 1956/01/20 Today's Date: 02/02/2023   History of Present Illness 67 y.o. male was in inpatient rehab who comes into the hospital 01/29/23 with abdominal pain and ?SBO. 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 Recent RLE DVT with IVC placed. PMHx:testicular CA, HTN, ankle and patellar fx's, retinal detachment, intestinal stromal tumor 2022,    PT Comments  Pt received in supine, drowsy after reporting he had IV pain meds ~1 hour prior, but with good participation as able. Pt limited due to symptoms of dizziness/severe fatigue after single standing trial, thigh-high TED hose donned throughout. Unable to assess standing BP due to pt symptoms. RN notified he may benefit from abdominal binder to see if this helps with hemodynamic stability for better standing tolerance. BP MAP (98) supine, MAP (97) sitting EOB, MAP (87) supine after standing trial. Pt encouraged to sit up in chair later in day if abdominal binder obtained and pt able to tolerate at mealtime or after to build tolerance for upright posture. DME recommendations updated below per discussion with supervising PT [name] [name], will continue to follow acutely to progress toward PT plan of care.    If plan is discharge home, recommend the following: Assistance with cooking/housework;Assist for transportation;A little help with walking and/or transfers;Help with stairs or ramp for entrance   Can travel by private vehicle        Equipment Recommendations  Rolling walker (2 wheels);BSC/3in1;Wheelchair (measurements PT);Wheelchair cushion (measurements PT);Other (comment) (drop arm wheelchair with elevating leg rests in case he needs to lateral scoot to chair)    Recommendations for Other Services Rehab consult     Precautions / Restrictions Precautions Precautions: Fall  (diarrhea, orthostasis) Precaution Comments: othostasis (has TED hose); abdominal incision Restrictions Weight Bearing Restrictions: No     Mobility  Bed Mobility Overal bed mobility: Needs Assistance Bed Mobility: Rolling, Sidelying to Sit, Sit to Supine Rolling: Modified independent (Device/Increase time) Sidelying to sit: Contact guard assist   Sit to supine: Supervision   General bed mobility comments: Cues for log roll, pt performs log roll getting to R EOB, but ignores cues and performs return to supine without roll and Supervision after fatigue from EOB activity.    Transfers Overall transfer level: Needs assistance Equipment used: None Transfers: Sit to/from Stand Sit to Stand: Min assist, From elevated surface           General transfer comment: from EOB<>RW pt attempts with CGA but unable to achieve upright, cues for use of momentum and minA lift assist. Poor standing tolerance, unable to stand long enough for BP reading. Pt performed additional squat x1 from EOB to remove gown but defers additional standing trials due to fatigue. ~2 sidesteps toward [facility] on his R side while standing.    Ambulation/Gait               General Gait Details: unable due to dizziness/severe c/o fatigue   Stairs             Wheelchair Mobility     Tilt Bed    Modified Rankin (Stroke Patients Only)       Balance Overall balance assessment: Needs assistance Sitting-balance support: No upper extremity supported, Feet supported Sitting balance-Leahy Scale: Good Sitting balance - Comments: EOB, mostly with single UE support due to fatigue   Standing balance support: Single extremity supported, Reliant on assistive device for balance Standing  balance-Leahy Scale: Poor Standing balance comment: standing ~20 seconds with LUE on RW and PTA holding RUE during BP reading, CGA for safety. Pt impulsive to sit as he fatigued with minA to guide to sitting EOB with BLE decreased  eccentric control to sit.                            Cognition Arousal: Alert Behavior During Therapy: Flat affect, WFL for tasks assessed/performed Overall Cognitive Status: Within Functional Limits for tasks assessed                               Problem Solving: Requires verbal cues General Comments: Spouse [name] [name] present and supportive during session.        Exercises General Exercises - Lower Extremity Ankle Circles/Pumps: AROM, Supine, Both, 10 reps Long Arc Quad: AROM, Both, 10 reps, Seated (cues to work on [employer] and hold 3 seconds, pt has difficulty with TKE on RLE) Heel Slides:  (verbal review) Hip ABduction/ADduction:  (verbal review) Hip Flexion/Marching: AROM, Both, 20 reps, Seated, Standing (x10 reps ea posture) Other Exercises Other Exercises: STS from EOB and static standing, pt unable to maintain >30 seconds; unable to perform more due to c/o fatigue/dizziness    General Comments General comments (skin integrity, edema, etc.): flexi-seal intact during session, pt/spouse instructed on pressure relief q2H, benefits of mobility throughout the day, especially up to chair to build tolerance for upright posture, use of pillows to offload flexi-seal if in bed/chair; benefits of keeping HOB elevated as high as possible (minimum >30* but higher would be better) after meals to ensure proper digestion for at least 45 mins.      Pertinent Vitals/Pain Pain Assessment Pain Assessment: Faces Faces Pain Scale: Hurts little more Pain Location: buttocks, generalized Pain Descriptors / Indicators: Discomfort, Grimacing Pain Intervention(s): Limited activity within patient's tolerance, Monitored during session, Repositioned (encouraged log roll for abdominal/bottom comfort when getting in/OOB, pt with poor carryover of cues)    Home Living                          Prior Function            PT Goals (current goals can now be found in the care plan  section) Acute Rehab PT Goals Patient Stated Goal: stop feeling dizzy and return home (not to rehab) PT Goal Formulation: With patient Time For Goal Achievement: 02/13/23 Progress towards PT goals: Not progressing toward goals - comment (will update DME recs based on slow progress)    Frequency    Min 1X/week      PT Plan      Co-evaluation              AM-PAC PT "6 Clicks" Mobility   Outcome Measure  Help needed turning from your back to your side while in a flat bed without using bedrails?: None Help needed moving from lying on your back to sitting on the side of a flat bed without using bedrails?: A Little Help needed moving to and from a bed to a chair (including a wheelchair)?: A Little Help needed standing up from a chair using your arms (e.g., wheelchair or bedside chair)?: A Little Help needed to walk in hospital room?: Total (currently not able to ambulate due to orthostatic symptoms) Help needed climbing 3-5 steps with a railing? : Total  6 Click Score: 15    End of Session Equipment Utilized During Treatment: Gait belt Activity Tolerance: Patient limited by fatigue;Treatment limited secondary to medical complications (Comment);Other (comment) (limited by dizziness and fatigue, concern for orthostatic hypotension but could not get standing BP due to symptoms) Patient left: in bed;with call bell/phone within reach;with family/visitor present (spouse in room, semi-sidelying toward his L to offload tailbone/flexi-seal; heels floated, BUE elevated due to edema) Nurse Communication: Mobility status;Other (comment) (he may benefit from abdominal binder to ensure BP stability) PT Visit Diagnosis: Other abnormalities of gait and mobility (R26.89) Pain - part of body:  (abdomen, bottom)     Time: 4098-1191 PT Time Calculation (min) (ACUTE ONLY): 39 min  Charges:    $Therapeutic Exercise: 8-22 mins $Therapeutic Activity: 23-37 mins PT General Charges $$ ACUTE PT  VISIT: 1 Visit                     Lucius Wise P., PTA Acute Rehabilitation Services Secure Chat Preferred 9a-5:30pm Office: [phone]    Dorathy Kinsman [facility] 02/02/2023, 12:47 PM

## 2023-02-02 NOTE — Consult Note (Addendum)
[facility]     02/02/2023  [name] [name] October 11, 1955 [id number]  [facility] Institute [VBCI]   Primary Care Provider: [name], MD with [facility] [place], this provider is listed for the transition of care follow up appointments  and [facility] calls  Insurance: Medicare [facility]   [facility] Liaison rounding on unit    The patient was screened for 7 day readmission hospitalization with noted high risk score for unplanned readmission risk 2 ED visits and 3 hospital admissions in 6 months.  The patient was assessed for potential Community Care Coordination service needs for post hospital transition for care coordination. Review of patient's electronic medical record reveals patient is currently with noted pain and sleeping..   Plan: [facility] Liaison will continue to follow progress and disposition to asess for post hospital community care coordination/management needs.  Referral request for community care coordination: disposition is not completely known at this time patient with ongoing medical management.   VBCI Community Care Management/Population Health does not replace or interfere with any arrangements made by the Inpatient Transition of Care team.   For questions contact:   Charlesetta Shanks, RN, BSN, CCM [place]  [facility], [facility] Liaison Direct Dial: [phone] or secure chat Website: [email or web address]

## 2023-02-02 NOTE — Progress Notes (Signed)
OT Cancellation Note  Patient Details Name: [name] [name] MRN: [id number] DOB: 17-Jun-1955   Cancelled Treatment:    Reason Eval/Treat Not Completed: Patient declined, no reason specified (Attempted to get pt to perform mobility OOB, at least transfer to recliner. He was polite and stern in his refusal saying that he was having a bad day, recently receive some shot. He states "I know what I can and can't do but today I can't")  02/02/2023  AB, OTR/L  Acute Rehabilitation Services  Office: [phone]   Tristan Schroeder 02/02/2023, 5:20 PM

## 2023-02-02 NOTE — Progress Notes (Signed)
Inpatient Rehabilitation Discharge Medication Review by a Pharmacist  A complete drug regimen review was completed for this patient to identify any potential clinically significant medication issues.  High Risk Drug Classes Is patient taking? Indication by Medication  Antipsychotic No   Anticoagulant No   Antibiotic No   Opioid Yes Oxycodone, Hydromorphone-pain  Antiplatelet No   Hypoglycemics/insulin No   Vasoactive Medication Yes Amlodipine, hydralazine-HTN Amiodarone-afib  Chemotherapy No   Other Yes Vit C, Tums-supplement Lomotil-diarrhea Melatonin-sleep Pantoprazole-GIB Tamsulosin-BPH Vit C- wound care     Type of Medication Issue Identified Description of Issue Recommendation(s)  Drug Interaction(s) (clinically significant)     Duplicate Therapy     Allergy     No Medication Administration End Date     Incorrect Dose     Additional Drug Therapy Needed     Significant med changes from prior encounter (inform family/care partners about these prior to discharge).    Other       Clinically significant medication issues were identified that warrant physician communication and completion of prescribed/recommended actions by midnight of the next day:  No  Name of provider notified for urgent issues identified:   Provider Method of Notification:     Pharmacist comments:   Time spent performing this drug regimen review (minutes):  n/a   Danaly Bari A. Jeanella Craze, PharmD, BCPS, FNKF Clinical Pharmacist [place] Please utilize Amion for appropriate phone number to reach the unit pharmacist [facility])

## 2023-02-03 DIAGNOSIS — Z515 Encounter for palliative care: Secondary | ICD-10-CM

## 2023-02-03 DIAGNOSIS — Z66 Do not resuscitate: Secondary | ICD-10-CM | POA: Diagnosis not present

## 2023-02-03 DIAGNOSIS — K56609 Unspecified intestinal obstruction, unspecified as to partial versus complete obstruction: Secondary | ICD-10-CM | POA: Diagnosis not present

## 2023-02-03 DIAGNOSIS — Z7189 Other specified counseling: Secondary | ICD-10-CM | POA: Diagnosis not present

## 2023-02-03 DIAGNOSIS — Z711 Person with feared health complaint in whom no diagnosis is made: Secondary | ICD-10-CM

## 2023-02-03 DIAGNOSIS — Z789 Other specified health status: Secondary | ICD-10-CM

## 2023-02-03 LAB — CBC WITH DIFFERENTIAL/PLATELET
Abs Immature Granulocytes: 0.68 10*3/uL — ABNORMAL HIGH (ref 0.00–0.07)
Basophils Absolute: 0 10*3/uL (ref 0.0–0.1)
Basophils Relative: 0 %
Eosinophils Absolute: 0 10*3/uL (ref 0.0–0.5)
Eosinophils Relative: 0 %
HCT: 29.2 % — ABNORMAL LOW (ref 39.0–52.0)
Hemoglobin: 9.5 g/dL — ABNORMAL LOW (ref 13.0–17.0)
Immature Granulocytes: 6 %
Lymphocytes Relative: 3 %
Lymphs Abs: 0.4 10*3/uL — ABNORMAL LOW (ref 0.7–4.0)
MCH: 29 pg (ref 26.0–34.0)
MCHC: 32.5 g/dL (ref 30.0–36.0)
MCV: 89 fL (ref 80.0–100.0)
Monocytes Absolute: 1 10*3/uL (ref 0.1–1.0)
Monocytes Relative: 8 %
Neutro Abs: 10 10*3/uL — ABNORMAL HIGH (ref 1.7–7.7)
Neutrophils Relative %: 83 %
Platelets: 197 10*3/uL (ref 150–400)
RBC: 3.28 MIL/uL — ABNORMAL LOW (ref 4.22–5.81)
RDW: 14.5 % (ref 11.5–15.5)
WBC: 12.1 10*3/uL — ABNORMAL HIGH (ref 4.0–10.5)
nRBC: 0 % (ref 0.0–0.2)

## 2023-02-03 LAB — BASIC METABOLIC PANEL
Anion gap: 8 (ref 5–15)
BUN: 29 mg/dL — ABNORMAL HIGH (ref 8–23)
CO2: 22 mmol/L (ref 22–32)
Calcium: 8 mg/dL — ABNORMAL LOW (ref 8.9–10.3)
Chloride: 104 mmol/L (ref 98–111)
Creatinine, Ser: 1.62 mg/dL — ABNORMAL HIGH (ref 0.61–1.24)
GFR, Estimated: 46 mL/min — ABNORMAL LOW (ref >60)
Glucose, Bld: 98 mg/dL (ref 70–99)
Potassium: 3.5 mmol/L (ref 3.5–5.1)
Sodium: 134 mmol/L — ABNORMAL LOW (ref 135–145)

## 2023-02-03 LAB — MAGNESIUM: Magnesium: 1.6 mg/dL — ABNORMAL LOW (ref 1.7–2.4)

## 2023-02-03 LAB — PHOSPHORUS: Phosphorus: 3.2 mg/dL (ref 2.5–4.6)

## 2023-02-03 LAB — C-REACTIVE PROTEIN: CRP: 15.3 mg/dL — ABNORMAL HIGH (ref <1.0)

## 2023-02-03 LAB — PROCALCITONIN: Procalcitonin: 0.65 ng/mL

## 2023-02-03 LAB — BRAIN NATRIURETIC PEPTIDE: B Natriuretic Peptide: 406.1 pg/mL — ABNORMAL HIGH (ref 0.0–100.0)

## 2023-02-03 MED ORDER — AMLODIPINE BESYLATE 5 MG PO TABS
5.0000 mg | ORAL_TABLET | Freq: Once | ORAL | Status: AC
  Administered 2023-02-03: 5 mg via ORAL
  Filled 2023-02-03: qty 1

## 2023-02-03 MED ORDER — AMLODIPINE BESYLATE 10 MG PO TABS
10.0000 mg | ORAL_TABLET | Freq: Every day | ORAL | Status: DC
  Administered 2023-02-04 – 2023-02-05 (×2): 10 mg via ORAL
  Filled 2023-02-03 (×4): qty 1

## 2023-02-03 MED ORDER — AMLODIPINE BESYLATE 10 MG PO TABS: 10.0000 mg | ORAL_TABLET | Freq: Every day | ORAL | Status: DC

## 2023-02-03 MED ORDER — ISOSORBIDE MONONITRATE ER 30 MG PO TB24
30.0000 mg | ORAL_TABLET | Freq: Every day | ORAL | Status: DC
  Administered 2023-02-03 – 2023-02-05 (×3): 30 mg via ORAL
  Filled 2023-02-03 (×5): qty 1

## 2023-02-03 NOTE — TOC Progression Note (Signed)
Transition of Care [facility]) - Progression Note    Patient Details  Name: [name] [name] MRN: [id number] Date of Birth: Mar 03, 1956  Transition of Care [facility]) CM/SW Contact  Lawerance Sabal, RN Phone Number: 02/03/2023, 1:01 PM  Clinical Narrative:     Notified by Joice Lofts with palliative that family would like referral made to [facility]. I have spoken with  Lanora Manis with [facility] and updated w referral.  Anticipate patient will need hospital bed and ambulance transport.     Barriers to Discharge: Continued Medical Work up  Expected Discharge Plan and Journalist, newspaper spoke with at [facility] Agency: [facility]   Social Determinants of Health (SDOH) Interventions SDOH Screenings   Food Insecurity: No Food Insecurity (01/29/2023)  Housing: Low Risk  (01/29/2023)  Transportation Needs: No Transportation Needs (01/29/2023)  Utilities: Not At Risk (01/29/2023)  Alcohol Screen: Low Risk  (11/20/2022)  Depression (PHQ2-9): Low Risk  (11/20/2022)  Financial Resource Strain: Low Risk  (12/01/2022)  Physical Activity: Sufficiently Active (12/01/2022)  Social Connections: Moderately Integrated (12/01/2022)  Stress: No Stress Concern Present (12/01/2022)  Tobacco Use: Low Risk  (01/29/2023)  Health Literacy: Adequate Health Literacy (11/20/2022)    Readmission Risk Interventions    12/26/2022   11:40 AM  Readmission Risk Prevention Plan  Transportation Screening Complete  HRI or Home Care Consult Complete  Social Work Consult for Recovery Care Planning/Counseling Complete  Palliative Care Screening Not Applicable  Medication Review [employer]) Complete

## 2023-02-03 NOTE — Plan of Care (Signed)

## 2023-02-03 NOTE — Progress Notes (Signed)
Physical Therapy Treatment Patient Details Name: [name] [name] MRN: [id number] DOB: 1955/10/06 Today's Date: 02/03/2023   History of Present Illness 67 y.o. male was in inpatient rehab who comes into the hospital 01/29/23 with abdominal pain and ?SBO. 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 Recent RLE DVT with IVC placed. +orthostasis PMHx:testicular CA, HTN, ankle and patellar fx's, retinal detachment, intestinal stromal tumor 2022,    PT Comments  Noted MD note stating pt considering a comfort care path and Palliative Consult is pending. Offered if pt wanted to proceed with PT today and he really wanted to try to get OOB to chair. Donned thigh high TEDs and abd binder. Supine BP 181/83. Transitioned to sit EOB with only mild dizziness. Did not wait to take BP as pt felt well enough and wanted to go ahead and get to chair. Once seated upright with feet on the floor BP 166/76 with mild dizziness. Patient wanted to sit with his feet on the floor and pt/wife aware of how to elevate legs if needed/desired. Will await decisions re: GOC and Palliative Consult.     If plan is discharge home, recommend the following: Assistance with cooking/housework;Assist for transportation;A little help with walking and/or transfers;Help with stairs or ramp for entrance   Can travel by private vehicle        Equipment Recommendations  Rolling walker (2 wheels);BSC/3in1;Wheelchair (measurements PT);Wheelchair cushion (measurements PT);Other (comment) (drop arm wheelchair with elevating leg rests in case he needs to lateral scoot to chair)    Recommendations for Other Services       Precautions / Restrictions Precautions Precautions: Fall (diarrhea, orthostasis) Precaution Comments: othostasis (has TED hose, abd binder); abdominal incision; flexiseal Restrictions Weight Bearing Restrictions: No     Mobility  Bed  Mobility Overal bed mobility: Needs Assistance Bed Mobility: Supine to Sit     Supine to sit: HOB elevated, Min assist     General bed mobility comments: pt initiated supine to sit and then rotated to side to sit; assist to raise shoulders (pt on air mattress)    Transfers Overall transfer level: Needs assistance Equipment used: None Transfers: Sit to/from Stand Sit to Stand: Min assist Stand pivot transfers: Min assist         General transfer comment: Attempted with no assist and could not come up to standing; min boosting and then steadying assist; +mild dizziness (did not stay standing for BP as pt wanted to get to the chair) Seated BP 166/76    Ambulation/Gait                   Stairs             Wheelchair Mobility     Tilt Bed    Modified Rankin (Stroke Patients Only)       Balance Overall balance assessment: Needs assistance Sitting-balance support: No upper extremity supported, Feet supported Sitting balance-Leahy Scale: Good Sitting balance - Comments: EOB, mostly with single UE support due to fatigue   Standing balance support: No upper extremity supported Standing balance-Leahy Scale: Poor                              Cognition Arousal: Alert Behavior During Therapy: WFL for tasks assessed/performed Overall Cognitive Status: Within Functional Limits for tasks assessed  Exercises      General Comments General comments (skin integrity, edema, etc.): Supine BP (with TEDs and abd binder) 181/83; came to sitting with mild dizziness and did not want to wait for BP to be taken, transitioned to chair and then seated BP 166/76 with report of only slight incr in dizziness with sit to stand transfer.      Pertinent Vitals/Pain Pain Assessment Pain Assessment: No/denies pain    Home Living                          Prior Function            PT Goals  (current goals can now be found in the care plan section) Acute Rehab PT Goals Patient Stated Goal: stop feeling dizzy and return home (not to rehab) Time For Goal Achievement: 02/13/23 Potential to Achieve Goals: Good Progress towards PT goals: Progressing toward goals    Frequency    Min 1X/week      PT Plan      Co-evaluation              AM-PAC PT "6 Clicks" Mobility   Outcome Measure  Help needed turning from your back to your side while in a flat bed without using bedrails?: None Help needed moving from lying on your back to sitting on the side of a flat bed without using bedrails?: A Little Help needed moving to and from a bed to a chair (including a wheelchair)?: A Little Help needed standing up from a chair using your arms (e.g., wheelchair or bedside chair)?: A Little Help needed to walk in hospital room?: Total (currently not able to ambulate due to orthostatic symptoms) Help needed climbing 3-5 steps with a railing? : Total 6 Click Score: 15    End of Session Equipment Utilized During Treatment: Gait belt Activity Tolerance: Patient limited by fatigue;Treatment limited secondary to medical complications (Comment) (limited by dizziness and fatigue, concern for orthostatic hypotension but did not stand long enough to get standing BP) Patient left: with call bell/phone within reach;with family/visitor present;in chair;Other (comment) (on blue cushion) Nurse Communication: Mobility status PT Visit Diagnosis: Other abnormalities of gait and mobility (R26.89)     Time: 8413-2440 PT Time Calculation (min) (ACUTE ONLY): 43 min  Charges:    $Therapeutic Activity: 23-37 mins $Self Care/Home Management: 8-22 PT General Charges $$ ACUTE PT VISIT: 1 Visit                      Jerolyn Center, PT Acute Rehabilitation Services  Office [phone]    Zena Amos 02/03/2023, 11:34 AM

## 2023-02-03 NOTE — Consult Note (Signed)
Consultation Note Date: 02/03/2023   Patient Name: [name] [name]  DOB: 03-17-1956  MRN: [id number]  Age / Sex: 67 y.o., male  PCP: [name], MD Referring Physician: Leroy Sea, MD  Reason for Consultation: Establishing goals of care, "adavanced Ischemic bowel, no treatment options, GOC"  HPI/Patient Profile: Per H&P: 67 y.o. male  with past medical history of HTN, HLD, who was in inpatient rehab who comes into the hospital with abdominal pain.  He recently had a prolonged and complicated hospitalization between 12/20/2022-01/24/2023.  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  Vascular surgery was consulted and he was found to have an acute occlusion of the distal SMA as well as the inferior mesenteric artery.  General surgery was also consulted, and he was taken to the OR emergently, underwent an SMA stent and had resected the portion of his jejunum due to necrosis.  Hospital course complicated by multiple OR trips to check on bowel integrity with additional resections and reanastomoses x 2.  He also developed renal failure and had to be placed on dialysis.  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  Upon stability he was discharged to inpatient rehab, he was doing well up until 01/29/23 when he felt sharp abdominal pain, along with nausea and vomiting. He was moved back to the acute care side: he was re-admitted on 01/29/2023 with concern for small bowel obstruction, recent SMA occlusion, mesenteric ischemia, small bowel infarct, ischemia proctitis,  AKI on CKD 3b, recent GIB.   He was seen by general surgery who at this time feel he has chronic mesenteric ischemia. Per [facility], he has no surgical options with poor prognosis.  Of note, while in rehab, he developed a DVT and underwent IVC filter placement.   Of note, he has had 3 hospital admissions and 2 ED visits in the last 6 months. He is a 7 day readmission.  Clinical Assessment and Goals of Care: I have reviewed medical records including EPIC notes, labs, and imaging. Received report from primary RN - no acute concerns.   Went to visit patient at bedside - wife/Anita present. Patient was sitting up in the chair awake, alert, oriented, and able to participate in conversation. No signs or non-verbal gestures of pain or discomfort noted. No respiratory distress, increased work of breathing, or secretions noted.   Met with patient and his wife  to discuss diagnosis, prognosis, GOC, EOL wishes, disposition, and options.  I introduced Palliative Medicine as specialized medical care for people living with serious illness. It focuses on providing relief from the symptoms and stress of a serious illness. The goal is to improve quality of life for both the patient and the family.  We discussed a brief life review of the patient as well as functional and nutritional status. Patient and his wife have been married 46 years - they have two children. Prior to hospitalization, he was living in a private residence with his wife.   We discussed patient's current illness and what it means in the larger context of patient's on-going co-morbidities. Patient has a clear understanding of his current acute medical situation. Therapeutic listening provided as he reflects over the course  of his hospitalizations and time in CIR.  Natural disease trajectory and expectations at EOL were discussed. I attempted to elicit values and goals of care important to the patient. The difference between aggressive medical intervention  and comfort care was considered in light of the patient's goals of care.   Patient is clear at this time he wishes to be kept comfortable at home. He would not want his life prolonged artificially with TPN.   Provided education and counseling at length on the philosophy and benefits of hospice care. Discussed that it offers a holistic approach to care in the setting of end-stage illness, and is about supporting the patient where they are allowing nature to take it's course. Discussed the hospice team includes RNs, physicians, social workers, and chaplains. They can provide personal care, support for the family, and help keep patient out of the hospital as well as assist with DME needs for home hospice. Education provided on the difference between home vs residential hospice. Patient opts for hospice referral to be initiated today - he would prefer to discharge home with hospice, requesting [facility] [place].   Goals while in house are to treat the treatable for medical optimization prior to discharge home with hospice.  Advance directives, concepts specific to code status, artificial feeding and hydration, and rehospitalization were considered and discussed. Patient does have a Living Will and HCPOA. Per his Living Will he would want to die a natural death. His wife is his HCPOA. Patient opts for DNR/DNI to be initiated today.   Patient/wife request an evaluation of his left arm wound - will consult WON for recommendations.  Visit also consisted of discussions dealing with the complex and emotionally intense issues of symptom management and palliative care in the setting of serious and potentially life-threatening illness.   Discussed with patient/family the importance of continued conversation with each other and the medical providers regarding overall plan of care and treatment options, ensuring decisions are within the context of the patient's values and GOCs.    Questions and concerns were  addressed. The patient/family was encouraged to call with questions and/or concerns. PMT card was provided.   Primary Decision Maker: PATIENT    SUMMARY OF RECOMMENDATIONS   Treat the treatable for medical optimization Plan is for discharge home with hospice Now DNR/DNI - durable DNR form completed and placed in shadow chart. Copy was made and will be scanned into Vynca/ACP tab TOC notified and consulted for: home hospice referral - [facility] Consulted WON: per patient/family request for evaluation and recommendations for left arm wound PMT will continue to follow and support holistically   Code Status/Advance Care Planning: DNR  Palliative Prophylaxis:  Aspiration, Frequent Pain Assessment, Oral Care, and Turn Reposition  Additional Recommendations (Limitations, Scope, Preferences): Avoid Hospitalization, No Artificial Feeding, No Surgical Procedures, and No Tracheostomy  Psycho-social/Spiritual:  Desire for further Chaplaincy support:no Created space and opportunity for patient and family to express thoughts and feelings regarding patient's current medical situation.  Emotional support and therapeutic listening provided.  Prognosis:  < 6 weeks  Discharge Planning: Home with Hospice      Primary Diagnoses: Present on Admission:  SBO (small bowel obstruction) (HCC)   I have reviewed the medical record, interviewed the patient and family, and examined the patient. The following aspects are pertinent.  Past Medical History:  Diagnosis Date   Ankle fracture    Cancer (HCC) 1974   testicular cancer-at age 28   Cataract  bilateral sx   GERD (gastroesophageal reflux disease)    hx of   Hx of colonic polyps    pt unsure,thinks this was done in 1982 in [place]. no reports in EPIC   Hyperlipidemia    on meds   Hypertension    on meds   Kidney stones    Patella fracture    Retinal detachment    OD   Retinal tear of right eye    Syncopal  episodes 2008   Social History   Socioeconomic History   Marital status: Married    Spouse name: Not on file   Number of children: Not on file   Years of education: Not on file   Highest education level: Some college, no degree  Occupational History   Occupation: Chief Executive Officer: [employer]  Tobacco Use   Smoking status: Never   Smokeless tobacco: Never  Vaping Use   Vaping status: Never Used  Substance and Sexual Activity   Alcohol use: Not Currently    Comment: 2 beers monthly   Drug use: No   Sexual activity: Not on file  Other Topics Concern   Not on file  Social History Narrative   Not on file   Social Determinants of Health   Financial Resource Strain: Low Risk  (12/01/2022)   Overall Financial Resource Strain (CARDIA)    Difficulty of Paying Living Expenses: Not hard at all  Food Insecurity: No Food Insecurity (01/29/2023)   Hunger Vital Sign    Worried About Running Out of Food in the Last Year: Never true    Ran Out of Food in the Last Year: Never true  Transportation Needs: No Transportation Needs (01/29/2023)   PRAPARE - Administrator, Civil Service (Medical): No    Lack of Transportation (Non-Medical): No  Physical Activity: Sufficiently Active (12/01/2022)   Exercise Vital Sign    Days of Exercise per Week: 3 days    Minutes of Exercise per Session: 120 min  Stress: No Stress Concern Present (12/01/2022)   [employer] of Occupational Health - Occupational Stress Questionnaire    Feeling of Stress : Not at all  Social Connections: Moderately Integrated (12/01/2022)   Social Connection and Isolation Panel [NHANES]    Frequency of Communication with Friends and Family: More than three times a week    Frequency of Social Gatherings with Friends and Family: Once a week    Attends Religious Services: Never    Database administrator or Organizations: No    Attends Engineer, structural: More than 4 times per year     Marital Status: Married   Family History  Problem Relation Age of Onset   Parkinson's disease Mother    Arrhythmia Father    Coronary artery disease Other        fhx   Hypertension Other        fhx   Sudden death Other        fhx   Colon cancer Neg Hx    Colon polyps Neg Hx    Esophageal cancer Neg Hx    Rectal cancer Neg Hx    Stomach cancer Neg Hx    Scheduled Meds:  acidophilus  1 capsule Oral Daily   [START ON 02/04/2023] amLODipine  10 mg Oral Daily   amLODipine  5 mg Oral Once   aspirin  81 mg Oral Daily   carvedilol  3.125 mg Oral BID WC  Chlorhexidine Gluconate Cloth  6 each Topical Daily   clobetasol cream   Topical BID   feeding supplement  237 mL Oral BID BM   isosorbide mononitrate  30 mg Oral Daily   pantoprazole  40 mg Oral Daily   tamsulosin  0.4 mg Oral Daily   Continuous Infusions: PRN Meds:.acetaminophen **OR** acetaminophen, hydrALAZINE, HYDROmorphone (DILAUDID) injection, labetalol, [DISCONTINUED] ondansetron **OR** ondansetron (ZOFRAN) IV, simethicone Medications Prior to Admission:  Prior to Admission medications   Medication Sig Start Date End Date Taking? Authorizing Provider  amiodarone (PACERONE) 200 MG tablet Take 1 tablet (200 mg total) by mouth daily. 01/24/23   Zigmund Daniel., MD  amLODipine (NORVASC) 5 MG tablet Take 1 tablet (5 mg total) by mouth daily. 01/24/23   Zigmund Daniel., MD  artificial tears (LACRILUBE) OINT ophthalmic ointment Place into the right eye every 4 (four) hours as needed for dry eyes. 01/25/23   Love, Evlyn Kanner, PA-C  hydrALAZINE (APRESOLINE) 50 MG tablet Take 1 tablet (50 mg total) by mouth 3 (three) times daily. 11/21/22   [name], MD  pantoprazole (PROTONIX) 40 MG tablet Take 1 tablet (40 mg total) by mouth 2 (two) times daily. 01/24/23 01/24/24  Zigmund Daniel., MD  tamsulosin (FLOMAX) 0.4 MG CAPS capsule Take 2 capsules (0.8 mg total) by mouth daily after supper. 01/24/23 02/23/23  Zigmund Daniel., MD  torsemide 40 MG TABS Take 40 mg by mouth daily. 01/24/23   Zigmund Daniel., MD   Allergies  Allergen Reactions   Zestril [Lisinopril] Cough   Review of Systems  Constitutional:  Positive for activity change, appetite change and fatigue.  Respiratory:  Negative for shortness of breath.   Gastrointestinal:  Negative for nausea and vomiting.  Skin:  Positive for wound.  Neurological:  Positive for weakness.  All other systems reviewed and are negative.   Physical Exam Vitals and nursing note reviewed.  Constitutional:      General: He is not in acute distress. Pulmonary:     Effort: No respiratory distress.  Skin:    General: Skin is warm and dry.  Neurological:     Mental Status: He is alert and oriented to person, place, and time.  Psychiatric:        Attention and Perception: Attention normal.        Behavior: Behavior is cooperative.        Cognition and Memory: Cognition and memory normal.     Vital Signs: BP (!) 190/92 (BP Location: Right Arm)   Pulse 75   Temp 98.6 F (37 C)   Resp 18   Wt 108 kg   SpO2 95%   BMI 33.21 kg/m  Pain Scale: 0-10   Pain Score: 0-No pain   SpO2: SpO2: 95 % O2 Device:SpO2: 95 % O2 Flow Rate: .   IO: Intake/output summary:  Intake/Output Summary (Last 24 hours) at 02/03/2023 1124 Last data filed at 02/03/2023 0515 Gross per 24 hour  Intake 1017.05 ml  Output 800 ml  Net 217.05 ml    LBM: Last BM Date : 02/03/23 Baseline Weight: Weight: 106 kg Most recent weight: Weight: 108 kg     Palliative Assessment/Data: PPS 50-60%     Time In: 1120 Time Out: 1250 Time Total: 90 minutes  Signed by: Haskel Khan, NP   Please contact Palliative Medicine Team phone at [phone] for questions and concerns.  For individual provider: See Amion  *Portions of  this note are a verbal dictation therefore any spelling and/or grammatical errors are due to the "Dragon Medical One" system interpretation.

## 2023-02-03 NOTE — Progress Notes (Signed)
Assessment & Plan: 67 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 Karin Lieu, exploratory laparotomy and small bowel resection Dr. Janee Morn -S/p re-exploration, replace abthera FB 12/23/22   -S/p re-exploration, small bowel resection with anastomosis x2, assessment of bowel perfusion with ICG, abdominal closure 9/16 Dr. Andrey Campanile - clear liquid diet plus supplements - did not tolerate ice cream yesterday   ID: none currently FEN: CLD VTE: SCDs        [name] Level, MD [facility] practice Office: [phone]        Chief Complaint: Mesenteric ischemia  Subjective: Patient in bed, family at bedside.  No complaints.  Did not tolerate ice cream.  Objective: Vital signs in last 24 hours: Temp:  [98.3 F (36.8 C)-99.2 F (37.3 C)] 98.6 F (37 C) (10/26 0735) Pulse Rate:  [75-85] 75 (10/26 0735) Resp:  [18-24] 18 (10/26 0735) BP: (140-190)/(67-92) 190/92 (10/26 0735) SpO2:  [95 %-97 %] 95 % (10/26 0735) Last BM Date : 02/03/23  Intake/Output from previous day: 10/25 0701 - 10/26 0700 In: 1017.1 [P.O.:275; I.V.:742.1] Out: 800 [Urine:800] Intake/Output this shift: No intake/output data recorded.  Physical Exam: HEENT - sclerae clear, mucous membranes moist Abdomen - soft, non-tender   Lab Results:  Recent Labs    02/02/23 0343 02/03/23 0457  WBC 12.8* 12.1*  HGB 9.5* 9.5*  HCT 28.1* 29.2*  PLT 189 197   BMET Recent Labs    02/02/23 0343 02/03/23 0457  NA 135 134*  K 3.9 3.5  CL 107 104  CO2 21* 22  GLUCOSE 108* 98  BUN 30* 29*  CREATININE 1.60* 1.62*  CALCIUM 8.0* 8.0*   PT/INR No results for input(s): "LABPROT", "INR" in the last 72 hours. Comprehensive Metabolic Panel:    Component Value Date/Time   NA 134 (L) 02/03/2023 0457   NA 135 02/02/2023 0343   K 3.5 02/03/2023 0457   K 3.9 02/02/2023 0343   CL 104 02/03/2023 0457   CL 107 02/02/2023 0343   CO2 22  02/03/2023 0457   CO2 21 (L) 02/02/2023 0343   BUN 29 (H) 02/03/2023 0457   BUN 30 (H) 02/02/2023 0343   CREATININE 1.62 (H) 02/03/2023 0457   CREATININE 1.60 (H) 02/02/2023 0343   GLUCOSE 98 02/03/2023 0457   GLUCOSE 108 (H) 02/02/2023 0343   CALCIUM 8.0 (L) 02/03/2023 0457   CALCIUM 8.0 (L) 02/02/2023 0343   AST 21 01/30/2023 0340   AST 27 01/25/2023 0459   ALT 32 01/30/2023 0340   ALT 43 01/25/2023 0459   ALKPHOS 77 01/30/2023 0340   ALKPHOS 62 01/25/2023 0459   BILITOT 1.0 01/30/2023 0340   BILITOT 0.5 01/25/2023 0459   BILITOT 0.8 03/16/2020 0800   PROT 5.4 (L) 01/30/2023 0340   PROT 4.8 (L) 01/25/2023 0459   PROT 6.3 03/16/2020 0800   ALBUMIN 2.1 (L) 01/30/2023 0340   ALBUMIN 2.4 (L) 01/29/2023 0548   ALBUMIN 4.3 03/16/2020 0800    Studies/Results: DG Abd 1 View  Result Date: 02/01/2023 CLINICAL DATA:  Follow up small bowel dilatation EXAM: ABDOMEN - 1 VIEW COMPARISON:  02/01/2023 FINDINGS: Scattered large and small bowel gas is noted. No obstructive changes are seen. IVC filter is noted in place. No free air is seen. Degenerative changes of lumbar spine are noted. IMPRESSION: No obstructive changes seen. Electronically Signed   By: Alcide Clever M.D.   On: 02/01/2023 20:22  [name] [name] 02/03/2023  Patient ID: [name] [name], male   DOB: Aug 11, 1955, 67 y.o.   MRN: [id number]

## 2023-02-03 NOTE — Progress Notes (Signed)
values in this interval not displayed.    --------------------------------------------------------------------------------------------------------------- Lab Results  Component Value Date   CHOL 96 01/27/2023   HDL 29 (L) 01/27/2023   LDLCALC 52 01/27/2023   LDLDIRECT 86.8 09/27/2012   TRIG 75 01/27/2023   CHOLHDL 3.3 01/27/2023    Lab Results  Component Value Date    HGBA1C 6.3 (H) 12/29/2022   No results for input(s): "TSH", "T4TOTAL", "FREET4", "T3FREE", "THYROIDAB" in the last 72 hours. No results for input(s): "VITAMINB12", "FOLATE", "FERRITIN", "TIBC", "IRON", "RETICCTPCT" in the last 72 hours. ------------------------------------------------------------------------------------------------------------------ Cardiac Enzymes No results for input(s): "CKMB", "TROPONINI", "MYOGLOBIN" in the last 168 hours.  Invalid input(s): "CK"  Micro Results No results found for this or any previous visit (from the past 240 hour(s)).  Radiology Reports DG Abd 1 View  Result Date: 02/01/2023 CLINICAL DATA:  Follow up small bowel dilatation EXAM: ABDOMEN - 1 VIEW COMPARISON:  02/01/2023 FINDINGS: Scattered large and small bowel gas is noted. No obstructive changes are seen. IVC filter is noted in place. No free air is seen. Degenerative changes of lumbar spine are noted. IMPRESSION: No obstructive changes seen. Electronically Signed   By: Alcide Clever M.D.   On: 02/01/2023 20:22   DG Abd Portable 1V  Result Date: 02/01/2023 CLINICAL DATA:  67 year old male small-bowel obstruction. EXAM: PORTABLE ABDOMEN - 1 VIEW COMPARISON:  01/31/2023 and earlier. FINDINGS: Portable AP supine view at 0632 hours. Negative lung bases. IVC filter redemonstrated. Nonobstructed bowel gas pattern, mid abdominal small bowel loops with flocculated material not significantly changed from the CT on 01/28/2023. Extensive Aortoiliac calcified atherosclerosis. No acute osseous abnormality identified. IMPRESSION: Stable bowel gas pattern without evidence of acute bowel obstruction. Aortic Atherosclerosis (ICD10-I70.0). Electronically Signed   By: Odessa Fleming M.D.   On: 02/01/2023 07:41   VAS [place] UPPER EXTREMITY VENOUS DUPLEX  Result Date: 01/31/2023 UPPER VENOUS STUDY  Patient Name:  [name] [name]  Date of Exam:   01/31/2023 Medical Rec #: [id number]      Accession #:    [id number] Date of Birth:  Jan 11, 1956      Patient Gender: M Patient Age:   67 years Exam Location:  [facility] Procedure:      VAS [place] UPPER EXTREMITY VENOUS DUPLEX Referring Phys: Bess Harvest [facility] --------------------------------------------------------------------------------  Indications: Swelling Risk Factors: DVT Surgery 01/26/2023 - IVC filter. Limitations: Poor ultrasound/tissue interface and line. Comparison Study: 01/25/2023 - Bilateral lower extremity venous duplex                   Acute deep venous thrombosis of right popliteal vein, right                   peroneal vein,                   left peroneal vein, left soleal vein, and left gastrocnemius                   vein. Please                   see table above for more detail. Performing Technologist: Chanda Busing RVT  Examination Guidelines: A complete evaluation includes B-mode imaging, spectral Doppler, color Doppler, and power Doppler as needed of all accessible portions of each vessel. Bilateral testing is considered an integral part of a complete examination. Limited examinations for reoccurring indications may be performed as noted.  Right Findings: +----------+------------+---------+-----------+----------+-------+ RIGHT     CompressiblePhasicitySpontaneousPropertiesSummary +----------+------------+---------+-----------+----------+-------+ IJV  PROGRESS NOTE                                                                                                                                                                                                             Patient Demographics:    [name] [name], is a 67 y.o. male, DOB - 17-Oct-1955, GNF:[id number]  Outpatient Primary MD for the patient is [name], MD    LOS - 5  Admit date - 01/29/2023    No chief complaint on file.      Brief Narrative (HPI from H&P)   67 y.o. male with medical history significant of HTN, HLD, who was in inpatient rehab who comes into the hospital with abdominal pain.  He recently had a prolonged and complicated hospitalization between 12/20/2022-01/24/2023.  For full hospitalization course please refer to the DC summary on 01/24/2023 by Dr. Lowell Guitar.  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  Vascular surgery was consulted and he was found to have an acute occlusion of the distal SMA as well as the inferior mesenteric artery.  General surgery was also consulted, and he was taken to the OR emergently, underwent an SMA stent and had resected the portion of his jejunum due to necrosis.  Hospital course complicated by multiple OR trips to check on bowel integrity with additional resections and reanastomoses x 2.  He also developed renal failure and had to be placed on dialysis.    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  Upon stability he was discharged to inpatient rehab, he was doing well up until yesterday when he felt sharp abdominal  pain, along with nausea and vomiting.    He was found to have concerns for small bowel obstruction, was seen by general surgery this morning and felt like he would benefit from being moved back to the acute care side.  Of note, while in rehab, he developed a DVT and underwent IVC filter placement.    Subjective:   Patient in bed, appears comfortable, denies any headache,  values in this interval not displayed.    --------------------------------------------------------------------------------------------------------------- Lab Results  Component Value Date   CHOL 96 01/27/2023   HDL 29 (L) 01/27/2023   LDLCALC 52 01/27/2023   LDLDIRECT 86.8 09/27/2012   TRIG 75 01/27/2023   CHOLHDL 3.3 01/27/2023    Lab Results  Component Value Date    HGBA1C 6.3 (H) 12/29/2022   No results for input(s): "TSH", "T4TOTAL", "FREET4", "T3FREE", "THYROIDAB" in the last 72 hours. No results for input(s): "VITAMINB12", "FOLATE", "FERRITIN", "TIBC", "IRON", "RETICCTPCT" in the last 72 hours. ------------------------------------------------------------------------------------------------------------------ Cardiac Enzymes No results for input(s): "CKMB", "TROPONINI", "MYOGLOBIN" in the last 168 hours.  Invalid input(s): "CK"  Micro Results No results found for this or any previous visit (from the past 240 hour(s)).  Radiology Reports DG Abd 1 View  Result Date: 02/01/2023 CLINICAL DATA:  Follow up small bowel dilatation EXAM: ABDOMEN - 1 VIEW COMPARISON:  02/01/2023 FINDINGS: Scattered large and small bowel gas is noted. No obstructive changes are seen. IVC filter is noted in place. No free air is seen. Degenerative changes of lumbar spine are noted. IMPRESSION: No obstructive changes seen. Electronically Signed   By: Alcide Clever M.D.   On: 02/01/2023 20:22   DG Abd Portable 1V  Result Date: 02/01/2023 CLINICAL DATA:  67 year old male small-bowel obstruction. EXAM: PORTABLE ABDOMEN - 1 VIEW COMPARISON:  01/31/2023 and earlier. FINDINGS: Portable AP supine view at 0632 hours. Negative lung bases. IVC filter redemonstrated. Nonobstructed bowel gas pattern, mid abdominal small bowel loops with flocculated material not significantly changed from the CT on 01/28/2023. Extensive Aortoiliac calcified atherosclerosis. No acute osseous abnormality identified. IMPRESSION: Stable bowel gas pattern without evidence of acute bowel obstruction. Aortic Atherosclerosis (ICD10-I70.0). Electronically Signed   By: Odessa Fleming M.D.   On: 02/01/2023 07:41   VAS [place] UPPER EXTREMITY VENOUS DUPLEX  Result Date: 01/31/2023 UPPER VENOUS STUDY  Patient Name:  [name] [name]  Date of Exam:   01/31/2023 Medical Rec #: [id number]      Accession #:    [id number] Date of Birth:  Jan 11, 1956      Patient Gender: M Patient Age:   67 years Exam Location:  [facility] Procedure:      VAS [place] UPPER EXTREMITY VENOUS DUPLEX Referring Phys: Bess Harvest [facility] --------------------------------------------------------------------------------  Indications: Swelling Risk Factors: DVT Surgery 01/26/2023 - IVC filter. Limitations: Poor ultrasound/tissue interface and line. Comparison Study: 01/25/2023 - Bilateral lower extremity venous duplex                   Acute deep venous thrombosis of right popliteal vein, right                   peroneal vein,                   left peroneal vein, left soleal vein, and left gastrocnemius                   vein. Please                   see table above for more detail. Performing Technologist: Chanda Busing RVT  Examination Guidelines: A complete evaluation includes B-mode imaging, spectral Doppler, color Doppler, and power Doppler as needed of all accessible portions of each vessel. Bilateral testing is considered an integral part of a complete examination. Limited examinations for reoccurring indications may be performed as noted.  Right Findings: +----------+------------+---------+-----------+----------+-------+ RIGHT     CompressiblePhasicitySpontaneousPropertiesSummary +----------+------------+---------+-----------+----------+-------+ IJV  values in this interval not displayed.    --------------------------------------------------------------------------------------------------------------- Lab Results  Component Value Date   CHOL 96 01/27/2023   HDL 29 (L) 01/27/2023   LDLCALC 52 01/27/2023   LDLDIRECT 86.8 09/27/2012   TRIG 75 01/27/2023   CHOLHDL 3.3 01/27/2023    Lab Results  Component Value Date    HGBA1C 6.3 (H) 12/29/2022   No results for input(s): "TSH", "T4TOTAL", "FREET4", "T3FREE", "THYROIDAB" in the last 72 hours. No results for input(s): "VITAMINB12", "FOLATE", "FERRITIN", "TIBC", "IRON", "RETICCTPCT" in the last 72 hours. ------------------------------------------------------------------------------------------------------------------ Cardiac Enzymes No results for input(s): "CKMB", "TROPONINI", "MYOGLOBIN" in the last 168 hours.  Invalid input(s): "CK"  Micro Results No results found for this or any previous visit (from the past 240 hour(s)).  Radiology Reports DG Abd 1 View  Result Date: 02/01/2023 CLINICAL DATA:  Follow up small bowel dilatation EXAM: ABDOMEN - 1 VIEW COMPARISON:  02/01/2023 FINDINGS: Scattered large and small bowel gas is noted. No obstructive changes are seen. IVC filter is noted in place. No free air is seen. Degenerative changes of lumbar spine are noted. IMPRESSION: No obstructive changes seen. Electronically Signed   By: Alcide Clever M.D.   On: 02/01/2023 20:22   DG Abd Portable 1V  Result Date: 02/01/2023 CLINICAL DATA:  67 year old male small-bowel obstruction. EXAM: PORTABLE ABDOMEN - 1 VIEW COMPARISON:  01/31/2023 and earlier. FINDINGS: Portable AP supine view at 0632 hours. Negative lung bases. IVC filter redemonstrated. Nonobstructed bowel gas pattern, mid abdominal small bowel loops with flocculated material not significantly changed from the CT on 01/28/2023. Extensive Aortoiliac calcified atherosclerosis. No acute osseous abnormality identified. IMPRESSION: Stable bowel gas pattern without evidence of acute bowel obstruction. Aortic Atherosclerosis (ICD10-I70.0). Electronically Signed   By: Odessa Fleming M.D.   On: 02/01/2023 07:41   VAS [place] UPPER EXTREMITY VENOUS DUPLEX  Result Date: 01/31/2023 UPPER VENOUS STUDY  Patient Name:  [name] [name]  Date of Exam:   01/31/2023 Medical Rec #: [id number]      Accession #:    [id number] Date of Birth:  Jan 11, 1956      Patient Gender: M Patient Age:   67 years Exam Location:  [facility] Procedure:      VAS [place] UPPER EXTREMITY VENOUS DUPLEX Referring Phys: Bess Harvest [facility] --------------------------------------------------------------------------------  Indications: Swelling Risk Factors: DVT Surgery 01/26/2023 - IVC filter. Limitations: Poor ultrasound/tissue interface and line. Comparison Study: 01/25/2023 - Bilateral lower extremity venous duplex                   Acute deep venous thrombosis of right popliteal vein, right                   peroneal vein,                   left peroneal vein, left soleal vein, and left gastrocnemius                   vein. Please                   see table above for more detail. Performing Technologist: Chanda Busing RVT  Examination Guidelines: A complete evaluation includes B-mode imaging, spectral Doppler, color Doppler, and power Doppler as needed of all accessible portions of each vessel. Bilateral testing is considered an integral part of a complete examination. Limited examinations for reoccurring indications may be performed as noted.  Right Findings: +----------+------------+---------+-----------+----------+-------+ RIGHT     CompressiblePhasicitySpontaneousPropertiesSummary +----------+------------+---------+-----------+----------+-------+ IJV  PROGRESS NOTE                                                                                                                                                                                                             Patient Demographics:    [name] [name], is a 67 y.o. male, DOB - 17-Oct-1955, GNF:[id number]  Outpatient Primary MD for the patient is [name], MD    LOS - 5  Admit date - 01/29/2023    No chief complaint on file.      Brief Narrative (HPI from H&P)   67 y.o. male with medical history significant of HTN, HLD, who was in inpatient rehab who comes into the hospital with abdominal pain.  He recently had a prolonged and complicated hospitalization between 12/20/2022-01/24/2023.  For full hospitalization course please refer to the DC summary on 01/24/2023 by Dr. Lowell Guitar.  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  Vascular surgery was consulted and he was found to have an acute occlusion of the distal SMA as well as the inferior mesenteric artery.  General surgery was also consulted, and he was taken to the OR emergently, underwent an SMA stent and had resected the portion of his jejunum due to necrosis.  Hospital course complicated by multiple OR trips to check on bowel integrity with additional resections and reanastomoses x 2.  He also developed renal failure and had to be placed on dialysis.    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  Upon stability he was discharged to inpatient rehab, he was doing well up until yesterday when he felt sharp abdominal  pain, along with nausea and vomiting.    He was found to have concerns for small bowel obstruction, was seen by general surgery this morning and felt like he would benefit from being moved back to the acute care side.  Of note, while in rehab, he developed a DVT and underwent IVC filter placement.    Subjective:   Patient in bed, appears comfortable, denies any headache,  PROGRESS NOTE                                                                                                                                                                                                             Patient Demographics:    [name] [name], is a 67 y.o. male, DOB - 17-Oct-1955, GNF:[id number]  Outpatient Primary MD for the patient is [name], MD    LOS - 5  Admit date - 01/29/2023    No chief complaint on file.      Brief Narrative (HPI from H&P)   67 y.o. male with medical history significant of HTN, HLD, who was in inpatient rehab who comes into the hospital with abdominal pain.  He recently had a prolonged and complicated hospitalization between 12/20/2022-01/24/2023.  For full hospitalization course please refer to the DC summary on 01/24/2023 by Dr. Lowell Guitar.  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  Vascular surgery was consulted and he was found to have an acute occlusion of the distal SMA as well as the inferior mesenteric artery.  General surgery was also consulted, and he was taken to the OR emergently, underwent an SMA stent and had resected the portion of his jejunum due to necrosis.  Hospital course complicated by multiple OR trips to check on bowel integrity with additional resections and reanastomoses x 2.  He also developed renal failure and had to be placed on dialysis.    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  Upon stability he was discharged to inpatient rehab, he was doing well up until yesterday when he felt sharp abdominal  pain, along with nausea and vomiting.    He was found to have concerns for small bowel obstruction, was seen by general surgery this morning and felt like he would benefit from being moved back to the acute care side.  Of note, while in rehab, he developed a DVT and underwent IVC filter placement.    Subjective:   Patient in bed, appears comfortable, denies any headache,  PROGRESS NOTE                                                                                                                                                                                                             Patient Demographics:    [name] [name], is a 67 y.o. male, DOB - 17-Oct-1955, GNF:[id number]  Outpatient Primary MD for the patient is [name], MD    LOS - 5  Admit date - 01/29/2023    No chief complaint on file.      Brief Narrative (HPI from H&P)   67 y.o. male with medical history significant of HTN, HLD, who was in inpatient rehab who comes into the hospital with abdominal pain.  He recently had a prolonged and complicated hospitalization between 12/20/2022-01/24/2023.  For full hospitalization course please refer to the DC summary on 01/24/2023 by Dr. Lowell Guitar.  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  Vascular surgery was consulted and he was found to have an acute occlusion of the distal SMA as well as the inferior mesenteric artery.  General surgery was also consulted, and he was taken to the OR emergently, underwent an SMA stent and had resected the portion of his jejunum due to necrosis.  Hospital course complicated by multiple OR trips to check on bowel integrity with additional resections and reanastomoses x 2.  He also developed renal failure and had to be placed on dialysis.    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  Upon stability he was discharged to inpatient rehab, he was doing well up until yesterday when he felt sharp abdominal  pain, along with nausea and vomiting.    He was found to have concerns for small bowel obstruction, was seen by general surgery this morning and felt like he would benefit from being moved back to the acute care side.  Of note, while in rehab, he developed a DVT and underwent IVC filter placement.    Subjective:   Patient in bed, appears comfortable, denies any headache,  values in this interval not displayed.    --------------------------------------------------------------------------------------------------------------- Lab Results  Component Value Date   CHOL 96 01/27/2023   HDL 29 (L) 01/27/2023   LDLCALC 52 01/27/2023   LDLDIRECT 86.8 09/27/2012   TRIG 75 01/27/2023   CHOLHDL 3.3 01/27/2023    Lab Results  Component Value Date    HGBA1C 6.3 (H) 12/29/2022   No results for input(s): "TSH", "T4TOTAL", "FREET4", "T3FREE", "THYROIDAB" in the last 72 hours. No results for input(s): "VITAMINB12", "FOLATE", "FERRITIN", "TIBC", "IRON", "RETICCTPCT" in the last 72 hours. ------------------------------------------------------------------------------------------------------------------ Cardiac Enzymes No results for input(s): "CKMB", "TROPONINI", "MYOGLOBIN" in the last 168 hours.  Invalid input(s): "CK"  Micro Results No results found for this or any previous visit (from the past 240 hour(s)).  Radiology Reports DG Abd 1 View  Result Date: 02/01/2023 CLINICAL DATA:  Follow up small bowel dilatation EXAM: ABDOMEN - 1 VIEW COMPARISON:  02/01/2023 FINDINGS: Scattered large and small bowel gas is noted. No obstructive changes are seen. IVC filter is noted in place. No free air is seen. Degenerative changes of lumbar spine are noted. IMPRESSION: No obstructive changes seen. Electronically Signed   By: Alcide Clever M.D.   On: 02/01/2023 20:22   DG Abd Portable 1V  Result Date: 02/01/2023 CLINICAL DATA:  67 year old male small-bowel obstruction. EXAM: PORTABLE ABDOMEN - 1 VIEW COMPARISON:  01/31/2023 and earlier. FINDINGS: Portable AP supine view at 0632 hours. Negative lung bases. IVC filter redemonstrated. Nonobstructed bowel gas pattern, mid abdominal small bowel loops with flocculated material not significantly changed from the CT on 01/28/2023. Extensive Aortoiliac calcified atherosclerosis. No acute osseous abnormality identified. IMPRESSION: Stable bowel gas pattern without evidence of acute bowel obstruction. Aortic Atherosclerosis (ICD10-I70.0). Electronically Signed   By: Odessa Fleming M.D.   On: 02/01/2023 07:41   VAS [place] UPPER EXTREMITY VENOUS DUPLEX  Result Date: 01/31/2023 UPPER VENOUS STUDY  Patient Name:  [name] [name]  Date of Exam:   01/31/2023 Medical Rec #: [id number]      Accession #:    [id number] Date of Birth:  Jan 11, 1956      Patient Gender: M Patient Age:   67 years Exam Location:  [facility] Procedure:      VAS [place] UPPER EXTREMITY VENOUS DUPLEX Referring Phys: Bess Harvest [facility] --------------------------------------------------------------------------------  Indications: Swelling Risk Factors: DVT Surgery 01/26/2023 - IVC filter. Limitations: Poor ultrasound/tissue interface and line. Comparison Study: 01/25/2023 - Bilateral lower extremity venous duplex                   Acute deep venous thrombosis of right popliteal vein, right                   peroneal vein,                   left peroneal vein, left soleal vein, and left gastrocnemius                   vein. Please                   see table above for more detail. Performing Technologist: Chanda Busing RVT  Examination Guidelines: A complete evaluation includes B-mode imaging, spectral Doppler, color Doppler, and power Doppler as needed of all accessible portions of each vessel. Bilateral testing is considered an integral part of a complete examination. Limited examinations for reoccurring indications may be performed as noted.  Right Findings: +----------+------------+---------+-----------+----------+-------+ RIGHT     CompressiblePhasicitySpontaneousPropertiesSummary +----------+------------+---------+-----------+----------+-------+ IJV

## 2023-02-03 NOTE — Plan of Care (Signed)
  Problem: Education: Goal: Knowledge of General Education information will improve Description: Including pain rating scale, medication(s)/side effects and non-pharmacologic comfort measures Outcome: Progressing   Problem: Health Behavior/Discharge Planning: Goal: Ability to manage health-related needs will improve Outcome: Progressing   Problem: Clinical Measurements: Goal: Ability to maintain clinical measurements within normal limits will improve Outcome: Progressing Goal: Respiratory complications will improve Outcome: Progressing Goal: Cardiovascular complication will be avoided Outcome: Progressing   Problem: Activity: Goal: Risk for activity intolerance will decrease Outcome: Progressing   Problem: Nutrition: Goal: Adequate nutrition will be maintained Outcome: Not Progressing   Problem: Coping: Goal: Level of anxiety will decrease Outcome: Not Progressing

## 2023-02-04 DIAGNOSIS — Z515 Encounter for palliative care: Secondary | ICD-10-CM | POA: Diagnosis not present

## 2023-02-04 DIAGNOSIS — Z66 Do not resuscitate: Secondary | ICD-10-CM | POA: Diagnosis not present

## 2023-02-04 DIAGNOSIS — K56609 Unspecified intestinal obstruction, unspecified as to partial versus complete obstruction: Secondary | ICD-10-CM | POA: Diagnosis not present

## 2023-02-04 DIAGNOSIS — Z7189 Other specified counseling: Secondary | ICD-10-CM | POA: Diagnosis not present

## 2023-02-04 LAB — CBC WITH DIFFERENTIAL/PLATELET
Abs Immature Granulocytes: 1.16 10*3/uL — ABNORMAL HIGH (ref 0.00–0.07)
Basophils Absolute: 0 10*3/uL (ref 0.0–0.1)
Basophils Relative: 0 %
Eosinophils Absolute: 0 10*3/uL (ref 0.0–0.5)
Eosinophils Relative: 0 %
HCT: 27 % — ABNORMAL LOW (ref 39.0–52.0)
Hemoglobin: 8.7 g/dL — ABNORMAL LOW (ref 13.0–17.0)
Immature Granulocytes: 11 %
Lymphocytes Relative: 4 %
Lymphs Abs: 0.5 10*3/uL — ABNORMAL LOW (ref 0.7–4.0)
MCH: 28.5 pg (ref 26.0–34.0)
MCHC: 32.2 g/dL (ref 30.0–36.0)
MCV: 88.5 fL (ref 80.0–100.0)
Monocytes Absolute: 0.9 10*3/uL (ref 0.1–1.0)
Monocytes Relative: 8 %
Neutro Abs: 8.1 10*3/uL — ABNORMAL HIGH (ref 1.7–7.7)
Neutrophils Relative %: 77 %
Platelets: 222 10*3/uL (ref 150–400)
RBC: 3.05 MIL/uL — ABNORMAL LOW (ref 4.22–5.81)
RDW: 14.5 % (ref 11.5–15.5)
WBC: 10.7 10*3/uL — ABNORMAL HIGH (ref 4.0–10.5)
nRBC: 0 % (ref 0.0–0.2)

## 2023-02-04 LAB — BASIC METABOLIC PANEL
Anion gap: 9 (ref 5–15)
BUN: 31 mg/dL — ABNORMAL HIGH (ref 8–23)
CO2: 20 mmol/L — ABNORMAL LOW (ref 22–32)
Calcium: 7.8 mg/dL — ABNORMAL LOW (ref 8.9–10.3)
Chloride: 104 mmol/L (ref 98–111)
Creatinine, Ser: 1.66 mg/dL — ABNORMAL HIGH (ref 0.61–1.24)
GFR, Estimated: 45 mL/min — ABNORMAL LOW (ref >60)
Glucose, Bld: 92 mg/dL (ref 70–99)
Potassium: 4.2 mmol/L (ref 3.5–5.1)
Sodium: 133 mmol/L — ABNORMAL LOW (ref 135–145)

## 2023-02-04 LAB — PROCALCITONIN: Procalcitonin: 0.47 ng/mL

## 2023-02-04 MED ORDER — CARVEDILOL 6.25 MG PO TABS
6.2500 mg | ORAL_TABLET | Freq: Two times a day (BID) | ORAL | Status: DC
  Administered 2023-02-04 – 2023-02-05 (×4): 6.25 mg via ORAL
  Filled 2023-02-04 (×6): qty 1

## 2023-02-04 MED ORDER — HYDROMORPHONE HCL 2 MG PO TABS
2.0000 mg | ORAL_TABLET | ORAL | Status: DC | PRN
Start: 2023-02-04 — End: 2023-02-04

## 2023-02-04 MED ORDER — HYDROMORPHONE HCL 2 MG PO TABS
2.0000 mg | ORAL_TABLET | ORAL | Status: DC | PRN
  Administered 2023-02-05 (×2): 2 mg via ORAL
  Filled 2023-02-04 (×4): qty 1

## 2023-02-04 MED ORDER — MORPHINE SULFATE 15 MG PO TABS: 22.5000 mg | ORAL_TABLET | ORAL | Status: DC | PRN

## 2023-02-04 MED ORDER — SILVER SULFADIAZINE 1 % EX CREA
TOPICAL_CREAM | Freq: Every day | CUTANEOUS | Status: DC
  Filled 2023-02-04: qty 85

## 2023-02-04 NOTE — Consult Note (Addendum)
WOC Nurse Consult Note: patient has had wound on L arm per he and wife since 9/11; says it started out as a rash underneath the skin Reason for Consult: patient request L arm wound  Wound type: full thickness unknown etiology  Pressure Injury POA: NA  Measurement: 17 cm x 8 cm x 0.1 cm  Wound bed: most distal aspect of wound appears like a blister, then has sloughed skin with pink and moist tissue exposed, some yellow appearing tissue to most medial aspect  Drainage (amount, consistency, odor) minimal serous  Periwound: intact  Dressing procedure/placement/frequency: Cleanse L arm wound with NS, apply Silvadene to wound bed daily, cover with telfa non-stick dressing, then wrap with Conform and secure with Ace bandage/Coban beginning at wrist and wrapping up beyond patients elbow towards upper arm to assist with bandage staying in place.    Discussed POC with patient, wife and primary MD.   WOC team will not follow at this time. Re-consult if further needs arise.   Thank you,    Priscella Mann MSN, RN-BC, [employer] [phone]

## 2023-02-04 NOTE — Progress Notes (Signed)
Daily Progress Note   Patient Name: [name] [name]       Date: 02/04/2023 DOB: July 28, 1955  Age: 67 y.o. MRN#: [id number] Attending Physician: Leroy Sea, MD Primary Care Physician: [name], MD Admit Date: 01/29/2023  Reason for Consultation/Follow-up: Disposition and Establishing goals of care  Subjective: I have reviewed medical records including EPIC notes, MAR, and labs. Received report from primary RN - no acute concerns.  Went to visit patient at bedside - no family/visitors present. Patient was lying in bed awake, alert, oriented, and able to participate in conversation. No signs or non-verbal gestures of pain or discomfort noted. No respiratory distress, increased work of breathing, or secretions noted. He experienced pain this morning with oral intake with relief after pain medication administration.   Emotional support and therapeutic listening provided as he reflects on his hospital course and being "at peace" with his decision for hospice care and his situation. He tells me, "I have lived a good life." His biggest goal at this time with hospice "is to be comfortable." Validated that is also the philosophy of hospice.   Reviewed medication/symptom management plans for after he returns home per his request. Also reviewed dietary recommendations per [facility].  All questions and concerns addressed. Encouraged to call with questions and/or concerns. PMT card previously provided.  Length of Stay: 6  Current Medications: Scheduled Meds:   acidophilus  1 capsule Oral Daily   amLODipine  10 mg Oral Daily   aspirin  81 mg Oral Daily   carvedilol  6.25 mg Oral BID WC   Chlorhexidine Gluconate Cloth  6 each Topical Daily   clobetasol cream   Topical BID   feeding supplement  237  mL Oral BID BM   isosorbide mononitrate  30 mg Oral Daily   pantoprazole  40 mg Oral Daily   tamsulosin  0.4 mg Oral Daily    Continuous Infusions:   PRN Meds: acetaminophen **OR** acetaminophen, hydrALAZINE, HYDROmorphone (DILAUDID) injection, labetalol, [DISCONTINUED] ondansetron **OR** ondansetron (ZOFRAN) IV, simethicone  Physical Exam          Vital Signs: BP (!) 145/66   Pulse 68   Temp 98.7 F (37.1 C) (Oral)   Resp 18   Wt 108 kg   SpO2 96%   BMI 33.21 kg/m  SpO2: SpO2: 96 %  O2 Device: O2 Device: Room Air O2 Flow Rate:    Intake/output summary:  Intake/Output Summary (Last 24 hours) at 02/04/2023 0951 Last data filed at 02/04/2023 0800 Gross per 24 hour  Intake --  Output 520 ml  Net -520 ml   LBM: Last BM Date : 02/03/23 Baseline Weight: Weight: 106 kg Most recent weight: Weight: 108 kg       Palliative Assessment/Data: PPS 40-50%      Patient Active Problem List   Diagnosis Date Noted   SBO (small bowel obstruction) (HCC) 01/29/2023   Debility 01/24/2023   Colon ulcer 01/10/2023   Hematochezia 01/08/2023   Physical deconditioning 01/04/2023   Acute respiratory failure (HCC) 12/28/2022   Protein-calorie malnutrition, severe 12/25/2022   Hypoalbuminemia 12/21/2022   GERD (gastroesophageal reflux disease) 12/21/2022   Acute renal failure superimposed on stage 3a chronic kidney disease (HCC) 12/21/2022   Mesenteric ischemia (HCC) 12/21/2022   Endotracheal tube present 12/21/2022   Superior mesenteric artery stenosis (HCC) 12/20/2022   CKD (chronic kidney disease) stage 3, GFR 30-59 ml/min (HCC) 09/11/2022   GIST (gastrointestinal stromal tumor) of small bowel, malignant (HCC) 07/26/2021   Acute GI bleeding 04/01/2021   Acute blood loss anemia 04/01/2021   Right hip impingement syndrome 08/18/2019   Aortic atherosclerosis (HCC) 07/10/2019   Elevated hemoglobin (HCC) 09/13/2017   Rhegmatogenous retinal detachment of right eye 06/16/2014    Hyperlipidemia 10/01/2007   Essential hypertension 10/01/2007   NEOPLASM, MALIGNANT, TESTES, HX OF 10/01/2007   History of colonic polyps 10/01/2007   NEPHROLITHIASIS, HX OF 10/01/2007    Palliative Care Assessment & Plan   Patient Profile: Per H&P: 67 y.o. male  with past medical history of HTN, HLD, who was in inpatient rehab who comes into the hospital with abdominal pain.  He recently had a prolonged and complicated hospitalization between 12/20/2022-01/24/2023.  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  Vascular surgery was consulted and he was found to have an acute occlusion of the distal SMA as well as the inferior mesenteric artery.  General surgery was also consulted, and he was taken to the OR emergently, underwent an SMA stent and had resected the portion of his jejunum due to necrosis.  Hospital course complicated by multiple OR trips to check on bowel integrity with additional resections and reanastomoses x 2.  He also developed renal failure and had to be placed on dialysis.  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  Upon stability he was discharged to inpatient rehab, he was doing well up until 01/29/23 when he felt sharp abdominal pain, along with nausea and vomiting. He was moved back to the acute care side: he was re-admitted on 01/29/2023 with concern for small bowel obstruction, recent SMA occlusion, mesenteric ischemia, small bowel infarct, ischemia proctitis, AKI on CKD 3b, recent GIB.   He was seen by general surgery who at this time feel he has chronic mesenteric ischemia. Per [facility], he has no surgical options with  poor prognosis.  Of note, while in rehab, he developed a DVT and underwent IVC filter placement.    Of note, he has had 3 hospital admissions and 2 ED visits in the last 6  months. He is a 7 day readmission.  Assessment: Principal Problem:   SBO (small bowel obstruction) (HCC)   Concern about end of life  Recommendations/Plan: Treat the treatable for medical optimization Plan remains for discharge home with hospice Continue DNR/DNI as previously documented In anticipation for discharge, added oral dilaudid q3h PRN severe pain; IV dilaudid available if PO not effective  PMT will continue to follow and support holistically  Goals of Care and Additional Recommendations: Limitations on Scope of Treatment: Avoid Hospitalization, No Artificial Feeding, No Surgical Procedures, and No Tracheostomy  Code Status:    Code Status Orders  (From admission, onward)           Start     Ordered   02/03/23 1237  Do not attempt resuscitation (DNR) - Comfort care  (Code Status)  Continuous       Question Answer Comment  If patient has no pulse and is not breathing Do Not Attempt Resuscitation   In Pre-Arrest Conditions (Patient Is Breathing and Has a Pulse) Provide comfort measures. Relieve any mechanical airway obstruction. Avoid transfer unless required for comfort.   Consent: Discussion documented in EHR or advanced directives reviewed      02/03/23 1236           Code Status History     Date Active Date Inactive Code Status Order ID Comments User Context   01/29/2023 1632 02/03/2023 1236 Full Code [id number]  Leatha Gilding, MD Inpatient   01/24/2023 1602 01/29/2023 1605 Full Code [id number]  Jerene Pitch Inpatient   12/20/2022 1619 01/24/2023 1559 Full Code [id number]  Hughie Closs, MD ED   04/01/2021 0503 04/06/2021 0246 Full Code [id number]  Eduard Clos, MD ED   06/16/2014 1636 06/17/2014 1017 Full Code [id number]  Sherrie George, MD Inpatient      Advance  Directive Documentation    Flowsheet Row Most Recent Value  Type of Advance Directive Healthcare Power of Attorney  Pre-existing out of facility DNR order (yellow form or pink MOST form) --  "MOST" Form in Place? --       Prognosis:  < 6 weeks  Discharge Planning: Home with Hospice  Care plan was discussed with primary RN, patient   Thank you for allowing the Palliative Medicine Team to assist in the care of this patient.     Haskel Khan, NP  Please contact Palliative Medicine Team phone at [phone] for questions and concerns.   *Portions of this note are a verbal dictation therefore any spelling and/or grammatical errors are due to the "[employer]" system interpretation.

## 2023-02-04 NOTE — Progress Notes (Signed)
Patient refused to have per oral diluadid for now as he was in extreme pain.IV Diluadid given.

## 2023-02-04 NOTE — Progress Notes (Signed)
I went by and saw [name] [name] this morning He has chosen to pursue palliative medicine-hospice at home. We discussed the best course of action in an effort to increase longevity and decrease pain is likely drinking high-protein drinks or high fat supplements and small frequent sittings.  Asked [name] [name] to please reach out if I can help him in any way.  I told him I am sorry that he has a problem that I cannot fix, and reiterated I have talked to all of my partners as well as general surgery about him.  He is at peace with his decision.   Victorino Sparrow

## 2023-02-04 NOTE — Plan of Care (Signed)
  Problem: Education: Goal: Knowledge of General Education information will improve Description: Including pain rating scale, medication(s)/side effects and non-pharmacologic comfort measures Outcome: Progressing   Problem: Health Behavior/Discharge Planning: Goal: Ability to manage health-related needs will improve Outcome: Progressing   Problem: Clinical Measurements: Goal: Ability to maintain clinical measurements within normal limits will improve Outcome: Progressing Goal: Will remain free from infection Outcome: Progressing Goal: Diagnostic test results will improve Outcome: Progressing Goal: Respiratory complications will improve Outcome: Progressing Goal: Cardiovascular complication will be avoided Outcome: Progressing   Problem: Activity: Goal: Risk for activity intolerance will decrease Outcome: Progressing   Problem: Nutrition: Goal: Adequate nutrition will be maintained Outcome: Progressing   Problem: Coping: Goal: Level of anxiety will decrease Outcome: Progressing   Problem: Elimination: Goal: Will not experience complications related to bowel motility Outcome: Progressing Goal: Will not experience complications related to urinary retention Outcome: Progressing   Problem: Pain Managment: Goal: General experience of comfort will improve Outcome: Progressing   Problem: Safety: Goal: Ability to remain free from injury will improve Outcome: Progressing   Problem: Skin Integrity: Goal: Risk for impaired skin integrity will decrease Outcome: Progressing   Problem: Activity: Goal: Ability to tolerate increased activity will improve Outcome: Progressing   Problem: Respiratory: Goal: Ability to maintain a clear airway and adequate ventilation will improve Outcome: Progressing   Problem: Role Relationship: Goal: Method of communication will improve Outcome: Progressing   Problem: Education: Goal: Knowledge of the prescribed therapeutic regimen will  improve Outcome: Progressing   Problem: Coping: Goal: Ability to identify and develop effective coping behavior will improve Outcome: Progressing   Problem: Clinical Measurements: Goal: Quality of life will improve Outcome: Progressing   Problem: Respiratory: Goal: Verbalizations of increased ease of respirations will increase Outcome: Progressing   Problem: Role Relationship: Goal: Family's ability to cope with current situation will improve Outcome: Progressing Goal: Ability to verbalize concerns, feelings, and thoughts to partner or family member will improve Outcome: Progressing   Problem: Pain Management: Goal: Satisfaction with pain management regimen will improve Outcome: Progressing   

## 2023-02-04 NOTE — Progress Notes (Signed)
PROGRESS NOTE                                                                                                                                                                                                             Patient Demographics:    [name] [name], is a 67 y.o. male, DOB - January 19, 1956, ZOX:[id number]  Outpatient Primary MD for the patient is [name], MD    LOS - 6  Admit date - 01/29/2023    No chief complaint on file.      Brief Narrative (HPI from H&P)   67 y.o. male with medical history significant of HTN, HLD, who was in inpatient rehab who comes into the hospital with abdominal pain.  He recently had a prolonged and complicated hospitalization between 12/20/2022-01/24/2023.  For full hospitalization course please refer to the DC summary on 01/24/2023 by Dr. Lowell Guitar.  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  Vascular surgery was consulted and he was found to have an acute occlusion of the distal SMA as well as the inferior mesenteric artery.  General surgery was also consulted, and he was taken to the OR emergently, underwent an SMA stent and had resected the portion of his jejunum due to necrosis.  Hospital course complicated by multiple OR trips to check on bowel integrity with additional resections and reanastomoses x 2.  He also developed renal failure and had to be placed on dialysis.    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  Upon stability he was discharged to inpatient rehab, he was doing well up until yesterday when he felt sharp abdominal  pain, along with nausea and vomiting.    He was found to have concerns for small bowel obstruction, was seen by general surgery this morning and felt like he would benefit from being moved back to the acute care side.  Of note, while in rehab, he developed a DVT and underwent IVC filter placement.    Subjective:   Patient in bed, appears comfortable, denies any headache,  PROGRESS NOTE                                                                                                                                                                                                             Patient Demographics:    [name] [name], is a 67 y.o. male, DOB - January 19, 1956, [id number]  Outpatient Primary MD for the patient is [name], MD    LOS - 6  Admit date - 01/29/2023    No chief complaint on file.      Brief Narrative (HPI from H&P)   67 y.o. male with medical history significant of HTN, HLD, who was in inpatient rehab who comes into the hospital with abdominal pain.  He recently had a prolonged and complicated hospitalization between 12/20/2022-01/24/2023.  For full hospitalization course please refer to the DC summary on 01/24/2023 by Dr. Lowell Guitar.  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  Vascular surgery was consulted and he was found to have an acute occlusion of the distal SMA as well as the inferior mesenteric artery.  General surgery was also consulted, and he was taken to the OR emergently, underwent an SMA stent and had resected the portion of his jejunum due to necrosis.  Hospital course complicated by multiple OR trips to check on bowel integrity with additional resections and reanastomoses x 2.  He also developed renal failure and had to be placed on dialysis.    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  Upon stability he was discharged to inpatient rehab, he was doing well up until yesterday when he felt sharp abdominal  pain, along with nausea and vomiting.    He was found to have concerns for small bowel obstruction, was seen by general surgery this morning and felt like he would benefit from being moved back to the acute care side.  Of note, while in rehab, he developed a DVT and underwent IVC filter placement.    Subjective:   Patient in bed, appears comfortable, denies any headache,  PROGRESS NOTE                                                                                                                                                                                                             Patient Demographics:    [name] [name], is a 67 y.o. male, DOB - January 19, 1956, [id number]  Outpatient Primary MD for the patient is [name], MD    LOS - 6  Admit date - 01/29/2023    No chief complaint on file.      Brief Narrative (HPI from H&P)   67 y.o. male with medical history significant of HTN, HLD, who was in inpatient rehab who comes into the hospital with abdominal pain.  He recently had a prolonged and complicated hospitalization between 12/20/2022-01/24/2023.  For full hospitalization course please refer to the DC summary on 01/24/2023 by Dr. Lowell Guitar.  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  Vascular surgery was consulted and he was found to have an acute occlusion of the distal SMA as well as the inferior mesenteric artery.  General surgery was also consulted, and he was taken to the OR emergently, underwent an SMA stent and had resected the portion of his jejunum due to necrosis.  Hospital course complicated by multiple OR trips to check on bowel integrity with additional resections and reanastomoses x 2.  He also developed renal failure and had to be placed on dialysis.    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  Upon stability he was discharged to inpatient rehab, he was doing well up until yesterday when he felt sharp abdominal  pain, along with nausea and vomiting.    He was found to have concerns for small bowel obstruction, was seen by general surgery this morning and felt like he would benefit from being moved back to the acute care side.  Of note, while in rehab, he developed a DVT and underwent IVC filter placement.    Subjective:   Patient in bed, appears comfortable, denies any headache,  CALCIUM 7.8*  --    < > 9.0  --   --  8.3* 8.1*  --  8.0* 8.0* 7.8*   < > = values in this interval not displayed.    --------------------------------------------------------------------------------------------------------------- Lab Results  Component Value Date   CHOL 96 01/27/2023   HDL 29 (L) 01/27/2023   LDLCALC 52 01/27/2023   LDLDIRECT 86.8 09/27/2012   TRIG 75 01/27/2023   CHOLHDL 3.3 01/27/2023    Lab Results  Component Value Date    HGBA1C 6.3 (H) 12/29/2022   No results for input(s): "TSH", "T4TOTAL", "FREET4", "T3FREE", "THYROIDAB" in the last 72 hours. No results for input(s): "VITAMINB12", "FOLATE", "FERRITIN", "TIBC", "IRON", "RETICCTPCT" in the last 72 hours. ------------------------------------------------------------------------------------------------------------------ Cardiac Enzymes No results for input(s): "CKMB", "TROPONINI", "MYOGLOBIN" in the last 168 hours.  Invalid input(s): "CK"  Micro Results No results found for this or any previous visit (from the past 240 hour(s)).  Radiology Reports DG Abd 1 View  Result Date: 02/01/2023 CLINICAL DATA:  Follow up small bowel dilatation EXAM: ABDOMEN - 1 VIEW COMPARISON:  02/01/2023 FINDINGS: Scattered large and small bowel gas is noted. No obstructive changes are seen. IVC filter is noted in place. No free air is seen. Degenerative changes of lumbar spine are noted. IMPRESSION: No obstructive changes seen. Electronically Signed   By: Alcide Clever M.D.   On: 02/01/2023 20:22   DG Abd Portable 1V  Result Date: 02/01/2023 CLINICAL DATA:  67 year old male small-bowel obstruction. EXAM: PORTABLE ABDOMEN - 1 VIEW COMPARISON:  01/31/2023 and earlier. FINDINGS: Portable AP supine view at 0632 hours. Negative lung bases. IVC filter redemonstrated. Nonobstructed bowel gas pattern, mid abdominal small bowel loops with flocculated material not significantly changed from the CT on 01/28/2023. Extensive Aortoiliac calcified atherosclerosis. No acute osseous abnormality identified. IMPRESSION: Stable bowel gas pattern without evidence of acute bowel obstruction. Aortic Atherosclerosis (ICD10-I70.0). Electronically Signed   By: Odessa Fleming M.D.   On: 02/01/2023 07:41   VAS [place] UPPER EXTREMITY VENOUS DUPLEX  Result Date: 01/31/2023 UPPER VENOUS STUDY  Patient Name:  OBRA BURCKHARD  Date of Exam:   01/31/2023 Medical Rec #: [id number]      Accession #:    [id number] Date of Birth:  1955/09/26      Patient Gender: M Patient Age:   40 years Exam Location:  [facility] Procedure:      VAS [place] UPPER EXTREMITY VENOUS DUPLEX Referring Phys: Bess Harvest [facility] --------------------------------------------------------------------------------  Indications: Swelling Risk Factors: DVT Surgery 01/26/2023 - IVC filter. Limitations: Poor ultrasound/tissue interface and line. Comparison Study: 01/25/2023 - Bilateral lower extremity venous duplex                   Acute deep venous thrombosis of right popliteal vein, right                   peroneal vein,                   left peroneal vein, left soleal vein, and left gastrocnemius                   vein. Please                   see table above for more detail. Performing Technologist: Chanda Busing RVT  Examination Guidelines: A complete evaluation includes B-mode imaging, spectral Doppler, color Doppler, and power Doppler as needed of all accessible portions of each vessel. Bilateral testing is considered  CALCIUM 7.8*  --    < > 9.0  --   --  8.3* 8.1*  --  8.0* 8.0* 7.8*   < > = values in this interval not displayed.    --------------------------------------------------------------------------------------------------------------- Lab Results  Component Value Date   CHOL 96 01/27/2023   HDL 29 (L) 01/27/2023   LDLCALC 52 01/27/2023   LDLDIRECT 86.8 09/27/2012   TRIG 75 01/27/2023   CHOLHDL 3.3 01/27/2023    Lab Results  Component Value Date    HGBA1C 6.3 (H) 12/29/2022   No results for input(s): "TSH", "T4TOTAL", "FREET4", "T3FREE", "THYROIDAB" in the last 72 hours. No results for input(s): "VITAMINB12", "FOLATE", "FERRITIN", "TIBC", "IRON", "RETICCTPCT" in the last 72 hours. ------------------------------------------------------------------------------------------------------------------ Cardiac Enzymes No results for input(s): "CKMB", "TROPONINI", "MYOGLOBIN" in the last 168 hours.  Invalid input(s): "CK"  Micro Results No results found for this or any previous visit (from the past 240 hour(s)).  Radiology Reports DG Abd 1 View  Result Date: 02/01/2023 CLINICAL DATA:  Follow up small bowel dilatation EXAM: ABDOMEN - 1 VIEW COMPARISON:  02/01/2023 FINDINGS: Scattered large and small bowel gas is noted. No obstructive changes are seen. IVC filter is noted in place. No free air is seen. Degenerative changes of lumbar spine are noted. IMPRESSION: No obstructive changes seen. Electronically Signed   By: Alcide Clever M.D.   On: 02/01/2023 20:22   DG Abd Portable 1V  Result Date: 02/01/2023 CLINICAL DATA:  67 year old male small-bowel obstruction. EXAM: PORTABLE ABDOMEN - 1 VIEW COMPARISON:  01/31/2023 and earlier. FINDINGS: Portable AP supine view at 0632 hours. Negative lung bases. IVC filter redemonstrated. Nonobstructed bowel gas pattern, mid abdominal small bowel loops with flocculated material not significantly changed from the CT on 01/28/2023. Extensive Aortoiliac calcified atherosclerosis. No acute osseous abnormality identified. IMPRESSION: Stable bowel gas pattern without evidence of acute bowel obstruction. Aortic Atherosclerosis (ICD10-I70.0). Electronically Signed   By: Odessa Fleming M.D.   On: 02/01/2023 07:41   VAS [place] UPPER EXTREMITY VENOUS DUPLEX  Result Date: 01/31/2023 UPPER VENOUS STUDY  Patient Name:  OBRA BURCKHARD  Date of Exam:   01/31/2023 Medical Rec #: [id number]      Accession #:    [id number] Date of Birth:  1955/09/26      Patient Gender: M Patient Age:   40 years Exam Location:  [facility] Procedure:      VAS [place] UPPER EXTREMITY VENOUS DUPLEX Referring Phys: Bess Harvest [facility] --------------------------------------------------------------------------------  Indications: Swelling Risk Factors: DVT Surgery 01/26/2023 - IVC filter. Limitations: Poor ultrasound/tissue interface and line. Comparison Study: 01/25/2023 - Bilateral lower extremity venous duplex                   Acute deep venous thrombosis of right popliteal vein, right                   peroneal vein,                   left peroneal vein, left soleal vein, and left gastrocnemius                   vein. Please                   see table above for more detail. Performing Technologist: Chanda Busing RVT  Examination Guidelines: A complete evaluation includes B-mode imaging, spectral Doppler, color Doppler, and power Doppler as needed of all accessible portions of each vessel. Bilateral testing is considered  PROGRESS NOTE                                                                                                                                                                                                             Patient Demographics:    [name] [name], is a 67 y.o. male, DOB - January 19, 1956, [id number]  Outpatient Primary MD for the patient is [name], MD    LOS - 6  Admit date - 01/29/2023    No chief complaint on file.      Brief Narrative (HPI from H&P)   67 y.o. male with medical history significant of HTN, HLD, who was in inpatient rehab who comes into the hospital with abdominal pain.  He recently had a prolonged and complicated hospitalization between 12/20/2022-01/24/2023.  For full hospitalization course please refer to the DC summary on 01/24/2023 by Dr. Lowell Guitar.  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  Vascular surgery was consulted and he was found to have an acute occlusion of the distal SMA as well as the inferior mesenteric artery.  General surgery was also consulted, and he was taken to the OR emergently, underwent an SMA stent and had resected the portion of his jejunum due to necrosis.  Hospital course complicated by multiple OR trips to check on bowel integrity with additional resections and reanastomoses x 2.  He also developed renal failure and had to be placed on dialysis.    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  Upon stability he was discharged to inpatient rehab, he was doing well up until yesterday when he felt sharp abdominal  pain, along with nausea and vomiting.    He was found to have concerns for small bowel obstruction, was seen by general surgery this morning and felt like he would benefit from being moved back to the acute care side.  Of note, while in rehab, he developed a DVT and underwent IVC filter placement.    Subjective:   Patient in bed, appears comfortable, denies any headache,  PROGRESS NOTE                                                                                                                                                                                                             Patient Demographics:    [name] [name], is a 67 y.o. male, DOB - January 19, 1956, [id number]  Outpatient Primary MD for the patient is [name], MD    LOS - 6  Admit date - 01/29/2023    No chief complaint on file.      Brief Narrative (HPI from H&P)   67 y.o. male with medical history significant of HTN, HLD, who was in inpatient rehab who comes into the hospital with abdominal pain.  He recently had a prolonged and complicated hospitalization between 12/20/2022-01/24/2023.  For full hospitalization course please refer to the DC summary on 01/24/2023 by Dr. Lowell Guitar.  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  Vascular surgery was consulted and he was found to have an acute occlusion of the distal SMA as well as the inferior mesenteric artery.  General surgery was also consulted, and he was taken to the OR emergently, underwent an SMA stent and had resected the portion of his jejunum due to necrosis.  Hospital course complicated by multiple OR trips to check on bowel integrity with additional resections and reanastomoses x 2.  He also developed renal failure and had to be placed on dialysis.    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  Upon stability he was discharged to inpatient rehab, he was doing well up until yesterday when he felt sharp abdominal  pain, along with nausea and vomiting.    He was found to have concerns for small bowel obstruction, was seen by general surgery this morning and felt like he would benefit from being moved back to the acute care side.  Of note, while in rehab, he developed a DVT and underwent IVC filter placement.    Subjective:   Patient in bed, appears comfortable, denies any headache,  PROGRESS NOTE                                                                                                                                                                                                             Patient Demographics:    [name] [name], is a 67 y.o. male, DOB - January 19, 1956, [id number]  Outpatient Primary MD for the patient is [name], MD    LOS - 6  Admit date - 01/29/2023    No chief complaint on file.      Brief Narrative (HPI from H&P)   67 y.o. male with medical history significant of HTN, HLD, who was in inpatient rehab who comes into the hospital with abdominal pain.  He recently had a prolonged and complicated hospitalization between 12/20/2022-01/24/2023.  For full hospitalization course please refer to the DC summary on 01/24/2023 by Dr. Lowell Guitar.  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  Vascular surgery was consulted and he was found to have an acute occlusion of the distal SMA as well as the inferior mesenteric artery.  General surgery was also consulted, and he was taken to the OR emergently, underwent an SMA stent and had resected the portion of his jejunum due to necrosis.  Hospital course complicated by multiple OR trips to check on bowel integrity with additional resections and reanastomoses x 2.  He also developed renal failure and had to be placed on dialysis.    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  Upon stability he was discharged to inpatient rehab, he was doing well up until yesterday when he felt sharp abdominal  pain, along with nausea and vomiting.    He was found to have concerns for small bowel obstruction, was seen by general surgery this morning and felt like he would benefit from being moved back to the acute care side.  Of note, while in rehab, he developed a DVT and underwent IVC filter placement.    Subjective:   Patient in bed, appears comfortable, denies any headache,

## 2023-02-04 NOTE — Plan of Care (Signed)
  Problem: Education: Goal: Knowledge of General Education information will improve Description: Including pain rating scale, medication(s)/side effects and non-pharmacologic comfort measures Outcome: Progressing   Problem: Health Behavior/Discharge Planning: Goal: Ability to manage health-related needs will improve Outcome: Progressing   Problem: Clinical Measurements: Goal: Ability to maintain clinical measurements within normal limits will improve Outcome: Progressing Goal: Will remain free from infection Outcome: Progressing   Problem: Activity: Goal: Risk for activity intolerance will decrease Outcome: Progressing   Problem: Nutrition: Goal: Adequate nutrition will be maintained Outcome: Progressing   Problem: Elimination: Goal: Will not experience complications related to bowel motility Outcome: Progressing Goal: Will not experience complications related to urinary retention Outcome: Progressing   Problem: Pain Managment: Goal: General experience of comfort will improve Outcome: Progressing   Problem: Safety: Goal: Ability to remain free from injury will improve Outcome: Progressing   Problem: Skin Integrity: Goal: Risk for impaired skin integrity will decrease Outcome: Progressing   

## 2023-02-04 NOTE — Plan of Care (Signed)

## 2023-02-05 ENCOUNTER — Inpatient Hospital Stay (HOSPITAL_COMMUNITY): Payer: Medicare Other

## 2023-02-05 ENCOUNTER — Other Ambulatory Visit: Payer: Self-pay

## 2023-02-05 DIAGNOSIS — R109 Unspecified abdominal pain: Secondary | ICD-10-CM | POA: Diagnosis not present

## 2023-02-05 DIAGNOSIS — Z7189 Other specified counseling: Secondary | ICD-10-CM | POA: Diagnosis not present

## 2023-02-05 DIAGNOSIS — K56609 Unspecified intestinal obstruction, unspecified as to partial versus complete obstruction: Secondary | ICD-10-CM | POA: Diagnosis not present

## 2023-02-05 DIAGNOSIS — Z515 Encounter for palliative care: Secondary | ICD-10-CM | POA: Diagnosis not present

## 2023-02-05 HISTORY — PX: IR REMOVAL TUN CV CATH W/O FL: IMG2289

## 2023-02-05 MED ORDER — LOPERAMIDE HCL 2 MG PO CAPS
2.0000 mg | ORAL_CAPSULE | Freq: Once | ORAL | Status: AC
  Administered 2023-02-05: 2 mg via ORAL
  Filled 2023-02-05: qty 1

## 2023-02-05 MED ORDER — MAGNESIUM SULFATE 4 GM/100ML IV SOLN
4.0000 g | Freq: Once | INTRAVENOUS | Status: AC
  Administered 2023-02-05: 4 g via INTRAVENOUS
  Filled 2023-02-05: qty 100

## 2023-02-05 NOTE — Progress Notes (Signed)
PROGRESS NOTE                                                                                                                                                                                                             Patient Demographics:    [name] [name], is a 67 y.o. male, DOB - December 19, 1955, ONG:[id number]  Outpatient Primary MD for the patient is [name], MD    LOS - 7  Admit date - 01/29/2023    No chief complaint on file.      Brief Narrative (HPI from H&P)   67 y.o. male with medical history significant of HTN, HLD, who was in inpatient rehab who comes into the hospital with abdominal pain.  He recently had a prolonged and complicated hospitalization between 12/20/2022-01/24/2023.  For full hospitalization course please refer to the DC summary on 01/24/2023 by Dr. Lowell Guitar.  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  Vascular surgery was consulted and he was found to have an acute occlusion of the distal SMA as well as the inferior mesenteric artery.  General surgery was also consulted, and he was taken to the OR emergently, underwent an SMA stent and had resected the portion of his jejunum due to necrosis.  Hospital course complicated by multiple OR trips to check on bowel integrity with additional resections and reanastomoses x 2.  He also developed renal failure and had to be placed on dialysis.    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  Upon stability he was discharged to inpatient rehab, he was doing well up until yesterday when he felt sharp abdominal  pain, along with nausea and vomiting.    He was found to have concerns for small bowel obstruction, was seen by general surgery this morning and felt like he would benefit from being moved back to the acute care side.  Of note, while in rehab, he developed a DVT and underwent IVC filter placement.    Subjective:   Patient in bed, appears comfortable, denies any headache,  no fever, no chest pain or pressure, no shortness of breath , no abdominal pain. No new focal weakness.   Assessment  & Plan :   Small bowel obstruction - CT scan done in rehab this morning showed small bowel dilatation of several loops in the left mid abdomen, concerning for slow transition date with possible early or partial small bowel obstruction.  With supportive care and bowel bowel obstruction seems to be much improved.  He is having bowel movements and passing flatus.  General surgery following as well.  Dietary restrictions mostly due to bowel ischemia below.   Recent SMA occlusion, mesenteric ischemia, small bowel infarction-vascular surgery continued to follow while in rehab and saw patient this morning.,  With any dietary challenge patient is developing excruciating abdominal pain suggesting he has ongoing ischemia possible mesenteric stent occlusion.  Due to previous history of GI bleeds he is not on anticoagulation or antiplatelets, with caution aspirin was reintroduced by vascular surgery on 02/01/2023 continue to monitor.  Discussed with vascular surgery in detail multiple times, no surgical options whatsoever, CTA if desired with the full knowledge that it can hurt his renal function, patient understands this and is thinking about whether he will undergo CTA or not, he will inform vascular surgery of the same.  Palliative care also involved as there are no surgical options and long-term prognosis does not appear to be good per vascular surgery.  Had a long discussion with patient on 02/03/2023, he told me he had a  lot of time to think about his prognosis and wants to focus on comfort and quality of life and wants to talk to palliative care, he is not interested in surviving off of TNA and would like to be made comfortable if he declines any further.  Wants to try full liquid diet and see how he does.    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  Placed on SCDs   Acute Lower extremity DVT -this was diagnosed on 10/17 while in rehab, lower extremity Doppler showed acute DVT of the right popliteal vein, right peroneal vein, left peroneal vein, left soleal vein, and left gastrocnemius vein.  Vascular surgery placed an IVC filter on 10/18.  Could not tolerate anticoagulation due to recent GI bleed.   Acute superficial vein thrombosis involving the right cephalic vein. Superficial thrombus detected in the cephalic vein is noted to be in the mid forearm.  Placed on aspirin per vascular surgery on 01/31/2023 and monitor.  He also had swelling in the left arm which is improved after elevation.  Acute kidney injury on CKD 3b  - baseline Cr ~ 2.0, follows with outpatient nephrology. He has required temporary HD, last hemodialysis session was 10/18.  Nephrology followed, saw patient this morning in rehab and actually signed off.  Continue to watch renal function, rate and monitor bladder scans.  Patient now has decided he is going to go comfort care route with home hospice, wants his right IJ catheter to be removed on 02/05/2023.   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 as per general surgery, hydralazine and IV labetalol PRN   Hypokalemia, hypomagnesemia-replaced, monitor   Recent GI bleed -requiring transfusion.  GI consulted and underwent EGD and colonoscopy on 10/2, colonoscopy showed severe mucosal ulceration in the rectum and rectosigmoid colon, felt to be ischemic   BPH-on Flomax   PAF with RVR -while hospitalized last time, hold amiodarone for now  while strictly n.p.o. monitor  on telemetry.  Most recent 2D echo done April 2024 showed normal LVEF, grade 1 diastolic dysfunction.  Of note intolerant to anticoagulation due to rectal bleeding recently.   Anemia of chronic illness-hemoglobin stable   Leukocytosis-noted.  Afebrile.  Will follow.   Left elbow dermatitis present for at least a month and a half.  Steroid cream and monitor.   R-internal jugular HD Catheter      Condition - fair  Family Communication  : Wife bedside 01/30/2023  Code Status : Full code  Consults  : General Surgery, VVS, palliative care  PUD Prophylaxis : PPI   Procedures  :     Bilateral upper extremity venous duplex.     Right: No evidence of deep vein thrombosis in the upper extremity. Findings consistent with acute superficial vein thrombosis involving the right cephalic vein. Superficial thrombus detected in the cephalic vein is noted to be in the mid forearm.  Left: No evidence of deep vein thrombosis in the upper extremity. No evidence of superficial vein thrombosis in the upper extremity.    CT - 1. Limited evaluation on this noncontrast study. 2. Similar-appearing dilatation of several loops of the small bowel within the left mid abdomen in the setting of small bowel resection surgical changes. These loops of small bowel no longer demonstrate bowel wall thickening but instead fecalized material within the lumen. Findings suggestive of a slow transition state with possible early or partial small bowel obstruction. No pneumatosis. No bowel wall perforation. Consider small-bowel follow-through radiographs series. 3. Under-distended rectum with persistent trace perirectal fat stranding. Recommend attention on follow-up. 4. Colonic diverticulosis with no acute diverticulitis. 5. Inferior vena cava filter in grossly appropriate position. 6.  Aortic Atherosclerosis (ICD10-I70.0)- severe. 7. Bilateral trace pleural effusions      Disposition Plan  :     Status is: Inpatient   DVT Prophylaxis  :    Place TED hose Start: 01/30/23 1037 SCDs Start: 01/29/23 1631    Lab Results  Component Value Date   PLT 222 02/04/2023    Diet :  Diet Order             Diet full liquid Room service appropriate? Yes; Fluid consistency: Thin  Diet effective now                    Inpatient Medications  Scheduled Meds:  acidophilus  1 capsule Oral Daily   amLODipine  10 mg Oral Daily   aspirin  81 mg Oral Daily   carvedilol  6.25 mg Oral BID WC   Chlorhexidine Gluconate Cloth  6 each Topical Daily   feeding supplement  237 mL Oral BID BM   isosorbide mononitrate  30 mg Oral Daily   loperamide  2 mg Oral Once   pantoprazole  40 mg Oral Daily   silver sulfADIAZINE   Topical Daily   tamsulosin  0.4 mg Oral Daily   Continuous Infusions:   PRN Meds:.acetaminophen **OR** acetaminophen, hydrALAZINE, HYDROmorphone (DILAUDID) injection, HYDROmorphone, labetalol, [DISCONTINUED] ondansetron **OR** ondansetron (ZOFRAN) IV, simethicone  Antibiotics  :    Anti-infectives (From admission, onward)    None         Objective:   Vitals:   02/04/23 1650 02/04/23 2043 02/04/23 2338 02/05/23 0323  BP: (!) 157/81 (!) 178/83 137/68 (!) 152/79  Pulse:  66 68 71  Resp: 18 18 (!) 21 20  Temp: 98.8 F (37.1 C) 98.4 F (36.9 C) 97.9 F (36.6 C) 98.1 F (  36.7 C)  TempSrc: Oral Oral Oral Oral  SpO2:   98% 95%  Weight:        Wt Readings from Last 3 Encounters:  02/02/23 108 kg  01/29/23 104.6 kg  01/24/23 96.5 kg     Intake/Output Summary (Last 24 hours) at 02/05/2023 0758 Last data filed at 02/05/2023 0350 Gross per 24 hour  Intake 240 ml  Output 1320 ml  Net -1080 ml     Physical Exam  Awake Alert, No new F.N deficits, Normal affect [place].AT,PERRAL Supple Neck, No JVD,   Symmetrical Chest wall movement, Good air movement bilaterally, CTAB RRR,No Gallops,Rubs or new Murmurs,  Hypoactive but +ve B.Sounds, Abd Soft, No  tenderness,   Left elbow site dermatitis noted present for the last month and a half according to family    RN pressure injury documentation: Pressure Injury 01/04/23 Buttocks Right;Upper Unstageable - Full thickness tissue loss in which the base of the injury is covered by slough (yellow, tan, gray, green or brown) and/or eschar (tan, brown or black) in the wound bed. (Active)  01/04/23 2100  Location: Buttocks  Location Orientation: Right;Upper  Staging: Unstageable - Full thickness tissue loss in which the base of the injury is covered by slough (yellow, tan, gray, green or brown) and/or eschar (tan, brown or black) in the wound bed.  Wound Description (Comments):   Present on Admission: No  Dressing Type Foam - Lift dressing to assess site every shift 02/05/23 0519      Data Review:    Recent Labs  Lab 01/31/23 0347 02/01/23 0611 02/01/23 2034 02/02/23 0343 02/03/23 0457 02/04/23 0410  WBC 17.7* 14.2* 13.7* 12.8* 12.1* 10.7*  HGB 9.6* 9.3* 9.2* 9.5* 9.5* 8.7*  HCT 29.5* 28.0* 27.5* 28.1* 29.2* 27.0*  PLT 178 153 190 189 197 222  MCV 89.4 90.0 88.7 88.6 89.0 88.5  MCH 29.1 29.9 29.7 30.0 29.0 28.5  MCHC 32.5 33.2 33.5 33.8 32.5 32.2  RDW 14.6 14.4 14.5 14.6 14.5 14.5  LYMPHSABS 0.3* 0.3*  --  0.3* 0.4* 0.5*  MONOABS 1.1* 0.9  --  1.0 1.0 0.9  EOSABS 0.0 0.0  --  0.0 0.0 0.0  BASOSABS 0.0 0.0  --  0.0 0.0 0.0    Recent Labs  Lab 01/30/23 0340 01/30/23 0744 01/30/23 0744 01/31/23 0347 02/01/23 0611 02/01/23 2034 02/02/23 0343 02/03/23 0457 02/04/23 0410  NA 138  --   --  139 136  --  135 134* 133*  K 4.0  --   --  3.1* 3.1*  --  3.9 3.5 4.2  CL 103  --   --  108 107  --  107 104 104  CO2 22  --   --  21* 23  --  21* 22 20*  ANIONGAP 13  --   --  10 6  --  7 8 9   GLUCOSE 146*  --   --  124* 111*  --  108* 98 92  BUN 33*  --   --  39* 33*  --  30* 29* 31*  CREATININE 2.08*  --   --  1.92* 1.65*  --  1.60* 1.62* 1.66*  AST 21  --   --   --   --   --   --   --    --   ALT 32  --   --   --   --   --   --   --   --  ALKPHOS 77  --   --   --   --   --   --   --   --   BILITOT 1.0  --   --   --   --   --   --   --   --   ALBUMIN 2.1*  --   --   --   --   --   --   --   --   CRP  --  20.0*  --  22.1* 18.0*  --  15.1* 15.3*  --   PROCALCITON  --  1.15   < > 1.02 0.88  --  0.87 0.65 0.47  LATICACIDVEN  --   --   --   --   --  0.7  --   --   --   INR  --  1.2  --   --   --   --   --   --   --   BNP 253.6*  --   --  194.8* 230.8*  --  514.1* 406.1*  --   MG 2.1  --   --  1.9 1.7  --  1.7 1.6*  --   CALCIUM 9.0  --   --  8.3* 8.1*  --  8.0* 8.0* 7.8*   < > = values in this interval not displayed.      Recent Labs  Lab 01/30/23 0340 01/30/23 0744 01/30/23 0744 01/31/23 0347 02/01/23 0611 02/01/23 2034 02/02/23 0343 02/03/23 0457 02/04/23 0410  CRP  --  20.0*  --  22.1* 18.0*  --  15.1* 15.3*  --   PROCALCITON  --  1.15   < > 1.02 0.88  --  0.87 0.65 0.47  LATICACIDVEN  --   --   --   --   --  0.7  --   --   --   INR  --  1.2  --   --   --   --   --   --   --   BNP 253.6*  --   --  194.8* 230.8*  --  514.1* 406.1*  --   MG 2.1  --   --  1.9 1.7  --  1.7 1.6*  --   CALCIUM 9.0  --   --  8.3* 8.1*  --  8.0* 8.0* 7.8*   < > = values in this interval not displayed.    --------------------------------------------------------------------------------------------------------------- Lab Results  Component Value Date   CHOL 96 01/27/2023   HDL 29 (L) 01/27/2023   LDLCALC 52 01/27/2023   LDLDIRECT 86.8 09/27/2012   TRIG 75 01/27/2023   CHOLHDL 3.3 01/27/2023    Lab Results  Component Value Date   HGBA1C 6.3 (H) 12/29/2022   No results for input(s): "TSH", "T4TOTAL", "FREET4", "T3FREE", "THYROIDAB" in the last 72 hours. No results for input(s): "VITAMINB12", "FOLATE", "FERRITIN", "TIBC", "IRON", "RETICCTPCT" in the last 72  hours. ------------------------------------------------------------------------------------------------------------------ Cardiac Enzymes No results for input(s): "CKMB", "TROPONINI", "MYOGLOBIN" in the last 168 hours.  Invalid input(s): "CK"  Micro Results No results found for this or any previous visit (from the past 240 hour(s)).  Radiology Reports DG Abd 1 View  Result Date: 02/01/2023 CLINICAL DATA:  Follow up small bowel dilatation EXAM: ABDOMEN - 1 VIEW COMPARISON:  02/01/2023 FINDINGS: Scattered large and small bowel gas is noted. No obstructive changes are seen. IVC filter is noted in place. No free air is seen. Degenerative changes of  lumbar spine are noted. IMPRESSION: No obstructive changes seen. Electronically Signed   By: Alcide Clever M.D.   On: 02/01/2023 20:22      Signature  -   Susa Raring M.D on 02/05/2023 at 7:58 AM   -  To page go to [email or web address]

## 2023-02-05 NOTE — Progress Notes (Signed)
OT Cancellation Note  Patient Details Name: [name] [name] MRN: [id number] DOB: 1955/06/05   Cancelled Treatment:    Reason Eval/Treat Not Completed: Other (comment) (Pt now comfort care with plans to d/c home with hospice. OT will sign off, please reconsult if there is a change in pt status.)  Carver Fila, OTD, OTR/L SecureChat Preferred Acute Rehab [phone]   [name] 02/05/2023, 3:03 PM

## 2023-02-05 NOTE — Procedures (Signed)
.  Interventional Radiology Procedure Note  PROCEDURE SUMMARY:  Successful removal of tunneled catheter.  No complications.   EBL = trace  Please see full dictation in imaging section of Epic for procedure details.  Danikah Budzik S Jameika Kinn    

## 2023-02-05 NOTE — Progress Notes (Signed)
Daily Progress Note   Patient Name: [name] [name]       Date: 02/05/2023 DOB: [birth year]  Age: 67 y.o. MRN#: [id number] Attending Physician: Leroy Sea, MD Primary Care Physician: [name], MD Admit Date: 01/29/2023  Reason for Consultation/Follow-up: Disposition and Establishing goals of care  Subjective: I have reviewed medical records including EPIC notes, MAR, and labs. Patient received 4 doses of PRN IV dilaudid and 1 dose of PRN PO dilaudid in the past 24 hours. Discussed with RN.  Patient assessed at the bedside. He reports pain improved from 7/10 to 1/10 after taking oral dilaudid around 8:30am this morning. He feels that this will work well for him at home, though he wishes he could still have the immediate relief of IV medication as well. He hopes that he can go home later today and confirms his desire [facility]. He also would like to try a regular diet while he is awaiting discharge so he can see how he does with it in advance. PA then arrived to remove R internal jugular catheter.  All questions and concerns addressed. Encouraged to call with questions and/or concerns. PMT card previously provided.  Length of Stay: 7   Physical Exam Vitals and nursing note reviewed.  Constitutional:      General: He is not in acute distress. Cardiovascular:     Rate and Rhythm: Normal rate.  Pulmonary:     Effort: Pulmonary effort is normal. No respiratory distress.  Neurological:     Mental Status: He is alert and oriented to person, place, and time.  Psychiatric:        Mood and Affect: Mood normal.        Behavior: Behavior normal.            Vital Signs: BP (!) 147/70 (BP Location: Right Arm)   Pulse 73   Temp 97.9 F (36.6 C) (Oral)   Resp 18    Wt 108 kg   SpO2 95%   BMI 33.21 kg/m  SpO2: SpO2: 95 % O2 Device: O2 Device: Room Air O2 Flow Rate:         Palliative Assessment/Data: PPS 40-50%   Palliative Care Assessment & Plan   Patient Profile: Per H&P: 67 y.o. male  with past medical history of HTN, HLD, who was in inpatient rehab who comes into the hospital with abdominal pain.  He recently had a prolonged and complicated hospitalization between 12/20/2022-01/24/2023.  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  Vascular surgery was consulted and he was found to have an acute occlusion of the distal SMA as well as the inferior mesenteric artery.  General surgery was also consulted, and he was taken to the OR emergently, underwent an SMA stent and had resected the portion of his jejunum due to necrosis.  Hospital course complicated by multiple OR trips to check on bowel integrity with additional resections and reanastomoses x 2.  He also developed renal failure and had to be placed on dialysis.  In  addition, could not tolerate anticoagulation due to rectal bleeding and a colonoscopy showed deep ulcerations encompassing 80% of the observed bowel, felt to be ischemic proctitis and he could not receive antiplatelets either.  Upon stability he was discharged to inpatient rehab, he was doing well up until 01/29/23 when he felt sharp abdominal pain, along with nausea and vomiting. He was moved back to the acute care side: he was re-admitted on 01/29/2023 with concern for small bowel obstruction, recent SMA occlusion, mesenteric ischemia, small bowel infarct, ischemia proctitis, AKI on CKD 3b, recent GIB.   He was seen by general surgery who at this time feel he has chronic mesenteric ischemia. Per VVS, he has no surgical options with poor prognosis.  Of note, while  in rehab, he developed a DVT and underwent IVC filter placement.    Of note, he has had 3 hospital admissions and 2 ED visits in the last 6 months. He is a 7 day readmission.  Assessment: Principal Problem:   SBO (small bowel obstruction) (HCC)   Concern about end of life  Recommendations/Plan: Continue DNR/DNI  Plan remains for discharge home with hospice ([facility], TOC placed referral 10/26); patient is hopeful to discharge today Continue oral dilaudid q3h PRN severe pain; continue IV dilaudid PRN while admitted Regular diet ordered for comfort feeding at patient's request PMT will continue to follow and support holistically   Prognosis:  < 6 weeks  Discharge Planning: Home with Hospice  Care plan was discussed with primary RN, patient, MD    MDM: high   Richardson Dopp, PA-C Palliative Medicine Team Team phone # [phone]  Thank you for allowing the Palliative Medicine Team to assist in the care of this patient. Please utilize secure chat with additional questions, if there is no response within 30 minutes please call the above phone number.  Palliative Medicine Team providers are available by phone from 7am to 7pm daily and can be reached through the team cell phone.  Should this patient require assistance outside of these hours, please call the patient's attending physician.  Portions of this note are a verbal dictation therefore any spelling and/or grammatical errors are due to the "[employer]agon [employer] One" system interpretation.

## 2023-02-05 NOTE — Plan of Care (Signed)
  Problem: Education: Goal: Knowledge of General Education information will improve Description: Including pain rating scale, medication(s)/side effects and non-pharmacologic comfort measures Outcome: Progressing   Problem: Health Behavior/Discharge Planning: Goal: Ability to manage health-related needs will improve Outcome: Progressing   Problem: Clinical Measurements: Goal: Ability to maintain clinical measurements within normal limits will improve Outcome: Progressing Goal: Will remain free from infection Outcome: Progressing Goal: Diagnostic test results will improve Outcome: Progressing Goal: Respiratory complications will improve Outcome: Progressing Goal: Cardiovascular complication will be avoided Outcome: Progressing   Problem: Activity: Goal: Risk for activity intolerance will decrease Outcome: Progressing   Problem: Nutrition: Goal: Adequate nutrition will be maintained Outcome: Progressing   Problem: Coping: Goal: Level of anxiety will decrease Outcome: Progressing   Problem: Elimination: Goal: Will not experience complications related to bowel motility Outcome: Progressing Goal: Will not experience complications related to urinary retention Outcome: Progressing   Problem: Pain Managment: Goal: General experience of comfort will improve Outcome: Progressing   Problem: Safety: Goal: Ability to remain free from injury will improve Outcome: Progressing   Problem: Skin Integrity: Goal: Risk for impaired skin integrity will decrease Outcome: Progressing   Problem: Activity: Goal: Ability to tolerate increased activity will improve Outcome: Progressing   Problem: Respiratory: Goal: Ability to maintain a clear airway and adequate ventilation will improve Outcome: Progressing   Problem: Role Relationship: Goal: Method of communication will improve Outcome: Progressing   Problem: Education: Goal: Knowledge of the prescribed therapeutic regimen will  improve Outcome: Progressing   Problem: Coping: Goal: Ability to identify and develop effective coping behavior will improve Outcome: Progressing   Problem: Clinical Measurements: Goal: Quality of life will improve Outcome: Progressing   Problem: Respiratory: Goal: Verbalizations of increased ease of respirations will increase Outcome: Progressing   Problem: Role Relationship: Goal: Family's ability to cope with current situation will improve Outcome: Progressing Goal: Ability to verbalize concerns, feelings, and thoughts to partner or family member will improve Outcome: Progressing   Problem: Pain Management: Goal: Satisfaction with pain management regimen will improve Outcome: Progressing   

## 2023-02-05 NOTE — Progress Notes (Signed)
Dressing placed at left elbow as ordered.

## 2023-02-05 NOTE — Progress Notes (Signed)
Physical Therapy Discharge Patient Details Name: [name] [name] MRN: [id number] DOB: 22-Feb-1956 Today's Date: 02/05/2023 Time:  -     Patient discharged from PT services secondary to  patient now comfort care with plans to discharge home with Hospice .  Please see latest therapy progress note for current level of functioning and progress toward goals.    Progress and discharge plan discussed with patient and/or caregiver: Patient/Caregiver agrees with plan  GP     Jerolyn Center, PT Acute Rehabilitation Services  Office [phone]   Zena Amos 02/05/2023, 12:37 PM

## 2023-02-06 ENCOUNTER — Inpatient Hospital Stay (HOSPITAL_COMMUNITY): Payer: Medicare Other

## 2023-02-06 DIAGNOSIS — K56609 Unspecified intestinal obstruction, unspecified as to partial versus complete obstruction: Secondary | ICD-10-CM | POA: Diagnosis not present

## 2023-02-06 LAB — BASIC METABOLIC PANEL
Anion gap: 8 (ref 5–15)
BUN: 27 mg/dL — ABNORMAL HIGH (ref 8–23)
CO2: 22 mmol/L (ref 22–32)
Calcium: 8.1 mg/dL — ABNORMAL LOW (ref 8.9–10.3)
Chloride: 105 mmol/L (ref 98–111)
Creatinine, Ser: 1.69 mg/dL — ABNORMAL HIGH (ref 0.61–1.24)
GFR, Estimated: 44 mL/min — ABNORMAL LOW (ref >60)
Glucose, Bld: 100 mg/dL — ABNORMAL HIGH (ref 70–99)
Potassium: 4.2 mmol/L (ref 3.5–5.1)
Sodium: 135 mmol/L (ref 135–145)

## 2023-02-06 LAB — MAGNESIUM: Magnesium: 2.1 mg/dL (ref 1.7–2.4)

## 2023-02-06 MED ORDER — SODIUM CHLORIDE 0.9 % IV SOLN
8.0000 mg | INTRAVENOUS | Status: DC | PRN
  Filled 2023-02-06: qty 4

## 2023-02-06 MED ORDER — SODIUM CHLORIDE 0.9 % IV SOLN
12.5000 mg | Freq: Three times a day (TID) | INTRAVENOUS | Status: DC | PRN
  Filled 2023-02-06: qty 0.5

## 2023-02-06 MED ORDER — LACTATED RINGERS IV SOLN: INTRAVENOUS | Status: AC

## 2023-02-06 NOTE — TOC Progression Note (Signed)
Transition of Care [facility]) - Progression Note    Patient Details  Name: [name] [name] MRN: [id number] Date of Birth: [birth year]  Transition of Care [facility]) CM/SW Contact  Gordy Clement, RN Phone Number: 02/06/2023, 10:27 AM  Clinical Narrative:     CM acknowledges consult to [facility] for Home Hospice referral.  [facility] [place] is arranged and they have delivered DME to the home. HOTP representative      Barriers to Discharge: Continued Medical Work up  Expected Discharge Plan and Journalist, newspaper spoke with at [facility] Agency: [facility]   Social Determinants of Health (SDOH) Interventions SDOH Screenings   Food Insecurity: No Food Insecurity (01/29/2023)  Housing: Low Risk  (01/29/2023)  Transportation Needs: No Transportation Needs (01/29/2023)  Utilities: Not At Risk (01/29/2023)  Alcohol Screen: Low Risk  (11/20/2022)  Depression (PHQ2-9): Low Risk  (11/20/2022)  Financial Resource Strain: Low Risk  (12/01/2022)  Physical Activity: Sufficiently Active (12/01/2022)  Social Connections: Moderately Integrated (12/01/2022)  Stress: No Stress Concern Present (12/01/2022)  Tobacco Use: Low Risk  (01/29/2023)  Health Literacy: Adequate Health Literacy (11/20/2022)    Readmission Risk Interventions    12/26/2022   11:40 AM  Readmission Risk Prevention Plan  Transportation Screening Complete  HRI or Home Care Consult Complete  Social Work Consult for Recovery Care Planning/Counseling Complete  Palliative Care Screening Not Applicable  Medication Review Oceanographer) Complete

## 2023-02-06 NOTE — Progress Notes (Signed)
   This pt has been approved for hospice services at d/c home. The equipment is in place and we will be ready to serve pt and family when he is ready for d/c.    Norm Parcel RN [phone]

## 2023-02-06 NOTE — Plan of Care (Signed)
  Problem: Education: Goal: Knowledge of General Education information will improve Description: Including pain rating scale, medication(s)/side effects and non-pharmacologic comfort measures Outcome: Progressing   Problem: Coping: Goal: Level of anxiety will decrease Outcome: Progressing   Problem: Safety: Goal: Ability to remain free from injury will improve Outcome: Progressing   

## 2023-02-06 NOTE — Progress Notes (Signed)
PROGRESS NOTE                                                                                                                                                                                                             Patient Demographics:    [name] [name], is a 67 y.o. male, DOB - 29-Jan-1956, HYQ:[id number]  Outpatient Primary MD for the patient is [name], MD    LOS - 8  Admit date - 01/29/2023    No chief complaint on file.      Brief Narrative (HPI from H&P)   67 y.o. male with medical history significant of HTN, HLD, who was in inpatient rehab who comes into the hospital with abdominal pain.  He recently had a prolonged and complicated hospitalization between 12/20/2022-01/24/2023.  For full hospitalization course please refer to the DC summary on 01/24/2023 by Dr. Lowell Guitar.  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  Vascular surgery was consulted and he was found to have an acute occlusion of the distal SMA as well as the inferior mesenteric artery.  General surgery was also consulted, and he was taken to the OR emergently, underwent an SMA stent and had resected the portion of his jejunum due to necrosis.  Hospital course complicated by multiple OR trips to check on bowel integrity with additional resections and reanastomoses x 2.  He also developed renal failure and had to be placed on dialysis.    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  Upon stability he was discharged to inpatient rehab, he was doing well up until yesterday when he felt sharp abdominal  pain, along with nausea and vomiting.    He was found to have concerns for small bowel obstruction, was seen by general surgery this morning and felt like he would benefit from being moved back to the acute care side.  Of note, while in rehab, he developed a DVT and underwent IVC filter placement.    Subjective:   Seen in bed, does not want to go  home with hospice today as he has developed nausea and vomiting this morning, no chest or abdominal pain, no headache or fevers.   Assessment  & Plan :   Small bowel obstruction - CT scan done in rehab this morning showed small bowel dilatation of several loops in the left mid abdomen, concerning for slow transition date with possible early or partial small bowel obstruction.  With supportive care and bowel bowel obstruction seems to be much improved.  He is having bowel movements and passing flatus.  General surgery following as well.  Dietary restrictions mostly due to bowel ischemia below.   Recent SMA occlusion, mesenteric ischemia, small bowel infarction-vascular surgery continued to follow while in rehab and saw patient this morning.,  With any dietary challenge patient is developing excruciating abdominal pain suggesting he has ongoing ischemia possible mesenteric stent occlusion.  Due to previous history of GI bleeds he is not on anticoagulation or antiplatelets, with caution aspirin was reintroduced by vascular surgery on 02/01/2023 continue to monitor.  Discussed with vascular surgery in detail multiple times, no surgical options whatsoever, CTA if desired with the full knowledge that it can hurt his renal function, patient understands this and is thinking about whether he will undergo CTA or not, he will inform vascular surgery of the same.  Palliative care also involved as there are no surgical options and long-term prognosis does not appear to be good per vascular surgery.  Had a long discussion with patient on 02/03/2023, he told me he had a  lot of time to think about his prognosis and wants to focus on comfort and quality of life and wants to talk to palliative care, he is not interested in surviving off of TNA and would like to be made comfortable if he declines any further.  He was tolerating full liquid diet and was to be discharged to home with hospice on 02/06/2023.  However prior to being discharged he wanted regular consistency diet, upon receiving it he was not able to tolerated and developed nausea vomiting and bloating, n.p.o. this morning, clear liquids thereafter, a gentle IV fluids, he would like to hold off on hospice discharge today.  Gentle IV fluids upon his request.    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  Placed on SCDs   Acute Lower extremity DVT -this was diagnosed on 10/17 while in rehab, lower extremity Doppler showed acute DVT of the right popliteal vein, right peroneal vein, left peroneal vein, left soleal vein, and left gastrocnemius vein.  Vascular surgery placed an IVC filter on 10/18.  Could not tolerate anticoagulation due to recent GI bleed.   Acute superficial vein thrombosis involving the right cephalic vein. Superficial thrombus detected in the cephalic vein is noted to be in the mid forearm.  Placed on aspirin per vascular surgery on 01/31/2023 and monitor.  He also had swelling in the left arm which is improved after elevation.  Acute kidney injury on CKD 3b  - baseline Cr ~ 2.0, follows with outpatient nephrology. He has required temporary HD, last hemodialysis session was 10/18.  Nephrology followed, saw patient this morning in rehab and actually signed off.  Continue to watch renal function, rate and monitor bladder scans.  Patient now has decided he is going to go comfort care route with home hospice, wants his right IJ catheter to be removed on 02/05/2023.   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 as per general surgery, hydralazine  and IV labetalol PRN   Hypokalemia, hypomagnesemia-replaced, monitor   Recent GI bleed -requiring transfusion.  GI consulted and underwent EGD and colonoscopy on 10/2, colonoscopy showed severe mucosal ulceration in the rectum and rectosigmoid colon, felt to be ischemic   BPH-on Flomax   PAF with RVR -while hospitalized last time, hold amiodarone for now while strictly n.p.o. monitor on telemetry.  Most recent 2D echo done April 2024 showed normal LVEF, grade 1 diastolic dysfunction.  Of note intolerant to anticoagulation due to rectal bleeding recently.   Anemia of chronic illness-hemoglobin stable   Leukocytosis-noted.  Afebrile.  Will follow.   Left elbow dermatitis present for at least a month and a half.  Steroid cream and monitor.  The arm elevated has some proximal swelling.  Ultrasound negative for DVT.        Condition - fair  Family Communication  : Wife bedside 01/30/2023  Code Status : Full code  Consults  : General Surgery, VVS, palliative care  PUD Prophylaxis : PPI   Procedures  :     Bilateral upper extremity venous duplex.     Right: No evidence of deep vein thrombosis in the upper extremity. Findings consistent with acute superficial vein thrombosis involving the right cephalic vein. Superficial thrombus detected in the cephalic vein is noted to be in the mid forearm.  Left: No evidence of deep vein thrombosis in the upper extremity. No evidence of superficial vein thrombosis in the upper extremity.    CT - 1. Limited evaluation on this noncontrast study. 2. Similar-appearing dilatation of several loops of the small bowel within the left mid abdomen in the setting of small bowel resection surgical changes. These loops of small bowel no longer demonstrate bowel wall thickening but instead fecalized material within the lumen. Findings suggestive of a slow transition state with possible early or partial small bowel obstruction. No pneumatosis. No bowel wall  perforation. Consider small-bowel follow-through radiographs series. 3. Under-distended rectum with persistent trace perirectal fat stranding. Recommend attention on follow-up. 4. Colonic diverticulosis with no acute diverticulitis. 5. Inferior vena cava filter in grossly appropriate position. 6.  Aortic Atherosclerosis (ICD10-I70.0)- severe. 7. Bilateral trace pleural effusions      Disposition Plan  :    Status is: Inpatient   DVT Prophylaxis  :    Place TED hose Start: 01/30/23 1037 SCDs Start: 01/29/23 1631    Lab Results  Component Value Date   PLT 222 02/04/2023    Diet :  Diet Order             Diet clear liquid Fluid consistency: Thin  Diet effective now                    Inpatient Medications  Scheduled Meds:  acidophilus  1 capsule Oral Daily   amLODipine  10 mg Oral Daily   aspirin  81 mg Oral Daily   carvedilol  6.25 mg Oral BID WC   Chlorhexidine Gluconate Cloth  6 each Topical Daily   feeding supplement  237 mL Oral BID BM   isosorbide mononitrate  30 mg Oral Daily   pantoprazole  40 mg Oral Daily   silver sulfADIAZINE   Topical Daily   tamsulosin  0.4 mg Oral Daily   Continuous Infusions:  lactated ringers      PRN Meds:.acetaminophen **OR** acetaminophen, hydrALAZINE, HYDROmorphone (DILAUDID) injection, HYDROmorphone, labetalol, [DISCONTINUED] ondansetron **OR** ondansetron (ZOFRAN) IV, simethicone  Antibiotics  :    Anti-infectives (From admission,  onward)    None         Objective:   Vitals:   02/05/23 2300 02/06/23 0341 02/06/23 0500 02/06/23 0810  BP: 139/70 (!) 155/79  (!) 165/75  Pulse: 78 75    Resp:      Temp: 98.1 F (36.7 C) 98 F (36.7 C)  98 F (36.7 C)  TempSrc: Oral Oral  Oral  SpO2: 93%     Weight:   108.2 kg     Wt Readings from Last 3 Encounters:  02/06/23 108.2 kg  01/29/23 104.6 kg  01/24/23 96.5 kg     Intake/Output Summary (Last 24 hours) at 02/06/2023 1050 Last data filed at 02/05/2023  2044 Gross per 24 hour  Intake --  Output 450 ml  Net -450 ml     Physical Exam  Awake Alert, No new F.N deficits, Normal affect [place].AT,PERRAL Supple Neck, No JVD,   Symmetrical Chest wall movement, Good air movement bilaterally, CTAB RRR,No Gallops,Rubs or new Murmurs,  Hypoactive but +ve B.Sounds, Abd Soft, No tenderness,   Left elbow site dermatitis noted present for the last month and a half according to family    RN pressure injury documentation: Pressure Injury 01/04/23 Buttocks Right;Upper Unstageable - Full thickness tissue loss in which the base of the injury is covered by slough (yellow, tan, gray, green or brown) and/or eschar (tan, brown or black) in the wound bed. (Active)  01/04/23 2100  Location: Buttocks  Location Orientation: Right;Upper  Staging: Unstageable - Full thickness tissue loss in which the base of the injury is covered by slough (yellow, tan, gray, green or brown) and/or eschar (tan, brown or black) in the wound bed.  Wound Description (Comments):   Present on Admission: No  Dressing Type Foam - Lift dressing to assess site every shift 02/06/23 0610      Data Review:    Recent Labs  Lab 01/31/23 0347 02/01/23 0611 02/01/23 2034 02/02/23 0343 02/03/23 0457 02/04/23 0410  WBC 17.7* 14.2* 13.7* 12.8* 12.1* 10.7*  HGB 9.6* 9.3* 9.2* 9.5* 9.5* 8.7*  HCT 29.5* 28.0* 27.5* 28.1* 29.2* 27.0*  PLT 178 153 190 189 197 222  MCV 89.4 90.0 88.7 88.6 89.0 88.5  MCH 29.1 29.9 29.7 30.0 29.0 28.5  MCHC 32.5 33.2 33.5 33.8 32.5 32.2  RDW 14.6 14.4 14.5 14.6 14.5 14.5  LYMPHSABS 0.3* 0.3*  --  0.3* 0.4* 0.5*  MONOABS 1.1* 0.9  --  1.0 1.0 0.9  EOSABS 0.0 0.0  --  0.0 0.0 0.0  BASOSABS 0.0 0.0  --  0.0 0.0 0.0    Recent Labs  Lab 01/31/23 0347 02/01/23 0611 02/01/23 2034 02/02/23 0343 02/03/23 0457 02/04/23 0410 02/06/23 0339  NA 139 136  --  135 134* 133* 135  K 3.1* 3.1*  --  3.9 3.5 4.2 4.2  CL 108 107  --  107 104 104 105  CO2 21* 23  --   21* 22 20* 22  ANIONGAP 10 6  --  7 8 9 8   GLUCOSE 124* 111*  --  108* 98 92 100*  BUN 39* 33*  --  30* 29* 31* 27*  CREATININE 1.92* 1.65*  --  1.60* 1.62* 1.66* 1.69*  CRP 22.1* 18.0*  --  15.1* 15.3*  --   --   PROCALCITON 1.02 0.88  --  0.87 0.65 0.47  --   LATICACIDVEN  --   --  0.7  --   --   --   --  BNP 194.8* 230.8*  --  514.1* 406.1*  --   --   MG 1.9 1.7  --  1.7 1.6*  --  2.1  CALCIUM 8.3* 8.1*  --  8.0* 8.0* 7.8* 8.1*      Recent Labs  Lab 01/31/23 0347 02/01/23 0611 02/01/23 2034 02/02/23 0343 02/03/23 0457 02/04/23 0410 02/06/23 0339  CRP 22.1* 18.0*  --  15.1* 15.3*  --   --   PROCALCITON 1.02 0.88  --  0.87 0.65 0.47  --   LATICACIDVEN  --   --  0.7  --   --   --   --   BNP 194.8* 230.8*  --  514.1* 406.1*  --   --   MG 1.9 1.7  --  1.7 1.6*  --  2.1  CALCIUM 8.3* 8.1*  --  8.0* 8.0* 7.8* 8.1*    --------------------------------------------------------------------------------------------------------------- Lab Results  Component Value Date   CHOL 96 01/27/2023   HDL 29 (L) 01/27/2023   LDLCALC 52 01/27/2023   LDLDIRECT 86.8 09/27/2012   TRIG 75 01/27/2023   CHOLHDL 3.3 01/27/2023    Lab Results  Component Value Date   HGBA1C 6.3 (H) 12/29/2022   No results for input(s): "TSH", "T4TOTAL", "FREET4", "T3FREE", "THYROIDAB" in the last 72 hours. No results for input(s): "VITAMINB12", "FOLATE", "FERRITIN", "TIBC", "IRON", "RETICCTPCT" in the last 72 hours. ------------------------------------------------------------------------------------------------------------------ Cardiac Enzymes No results for input(s): "CKMB", "TROPONINI", "MYOGLOBIN" in the last 168 hours.  Invalid input(s): "CK"  Micro Results No results found for this or any previous visit (from the past 240 hour(s)).  Radiology Reports IR Removal Tun Cv Cath W/O FL  Result Date: 02/05/2023 INDICATION: Patient with prolonged hospital stay secondary to mesenteric stenosis. Acute kidney  injury status post placement of a tunneled dialysis catheter on January 16, 2023 by Dr. Bryn Gulling. Patient now on palliative care with request for removal of tunneled hemodialysis catheter. EXAM: REMOVAL OF TUNNELED HEMODIALYSIS CATHETER MEDICATIONS: None COMPLICATIONS: None immediate. PROCEDURE: Verbal consent was obtained from the patient following an explanation of the procedure, risks, benefits and alternatives to treatment. A time out was performed prior to the initiation of the procedure. Using only gentle traction the catheter was removed intact. Hemostasis was obtained with manual compression. A dressing was placed. The patient tolerated the procedure well without immediate post procedural complication. IMPRESSION: Successful removal of tunneled dialysis catheter. Procedure performed by: Corrin Parker, PA-C Electronically Signed   By: Marliss Coots M.D.   On: 02/05/2023 14:25      Signature  -   Susa Raring M.D on 02/06/2023 at 10:50 AM   -  To page go to [email or web address]

## 2023-02-07 ENCOUNTER — Other Ambulatory Visit (HOSPITAL_COMMUNITY): Payer: Self-pay

## 2023-02-07 DIAGNOSIS — K56609 Unspecified intestinal obstruction, unspecified as to partial versus complete obstruction: Secondary | ICD-10-CM | POA: Diagnosis not present

## 2023-02-07 DIAGNOSIS — Z515 Encounter for palliative care: Secondary | ICD-10-CM

## 2023-02-07 MED ORDER — ACETAMINOPHEN 325 MG PO TABS: 650.0000 mg | ORAL_TABLET | Freq: Four times a day (QID) | ORAL | Status: DC | PRN

## 2023-02-07 MED ORDER — ONDANSETRON HCL 4 MG/2ML IJ SOLN
4.0000 mg | Freq: Once | INTRAMUSCULAR | Status: AC
  Administered 2023-02-07: 4 mg via INTRAVENOUS
  Filled 2023-02-07: qty 2

## 2023-02-07 MED ORDER — ISOSORBIDE MONONITRATE ER 30 MG PO TB24: 30.0000 mg | ORAL_TABLET | Freq: Every day | ORAL | 0 refills | Status: DC

## 2023-02-07 MED ORDER — ISOSORBIDE MONONITRATE ER 30 MG PO TB24
30.0000 mg | ORAL_TABLET | Freq: Every day | ORAL | 0 refills | Status: DC
  Filled 2023-02-07: qty 30, 30d supply, fill #0

## 2023-02-07 MED ORDER — ASPIRIN 81 MG PO CHEW: 81.0000 mg | CHEWABLE_TABLET | Freq: Every day | ORAL | 0 refills | Status: DC

## 2023-02-07 MED ORDER — ASPIRIN 81 MG PO CHEW
81.0000 mg | CHEWABLE_TABLET | Freq: Every day | ORAL | 0 refills | Status: DC
  Filled 2023-02-07: qty 30, 30d supply, fill #0

## 2023-02-07 MED ORDER — ONDANSETRON 4 MG PO TBDP
4.0000 mg | ORAL_TABLET | Freq: Three times a day (TID) | ORAL | 0 refills | Status: DC | PRN
  Filled 2023-02-07: qty 20, 7d supply, fill #0

## 2023-02-07 MED ORDER — HYDROMORPHONE HCL 1 MG/ML IJ SOLN
1.0000 mg | Freq: Once | INTRAMUSCULAR | Status: AC
  Administered 2023-02-07: 1 mg via INTRAVENOUS
  Filled 2023-02-07: qty 1

## 2023-02-07 MED ORDER — ONDANSETRON 4 MG PO TBDP: 4.0000 mg | ORAL_TABLET | Freq: Three times a day (TID) | ORAL | 0 refills | Status: DC | PRN

## 2023-02-07 MED ORDER — HYDROMORPHONE HCL 2 MG PO TABS
2.0000 mg | ORAL_TABLET | ORAL | 0 refills | Status: DC | PRN
  Filled 2023-02-07: qty 30, 4d supply, fill #0

## 2023-02-07 MED ORDER — HYDROMORPHONE HCL 2 MG PO TABS: 2.0000 mg | ORAL_TABLET | ORAL | 0 refills | Status: DC | PRN

## 2023-02-07 NOTE — Progress Notes (Signed)
   02/07/23 0909  Medical Necessity for Transport Certificate --- IF THIS TRANSPORT IS ROUND TRIP OR SCHEDULED AND REPEATED, A PHYSICIAN MUST COMPLETE THIS FORM  Transport from: (Location) [facility]  Transport to (Location)  ([address] [facility] [place] [address])  Did the patient arrive from a Skilled Nursing Facility, Assisted Living Facility or Group Home? No  Date of Transport Service 02/07/23  Name of Transporting Agency [facility]  Round Trip Transport? No  Reason for Transport Discharge  Is this a hospice patient? Yes  Is this transport related to the patient's terminal illness? Yes  Please descirbe GI Obstruction; renal failure;thrombosis  Describe the Medical Condition debility,weakness,confusion and high fall risk  Q1 Are ALL the following "true"? 1. Patient unable to get up from bed without assistance  AND  2. Unable to ambulate  AND  3. Unable to sit in a chair, including wheelchair. Yes  Q2 Could the patient be transported safely by other means of transportation (I.E., wheelchair van)? No  Q3 Please check any of the following conditions that apply at the time of transport: Risk of injury to self and/or others;Altered mental status  Electronic Signature Jonathon Jordan RN CM  Credentials RN  Date Signed 02/07/23  Print Form Print

## 2023-02-07 NOTE — Care Management Important Message (Signed)
Important Message  Patient Details  Name: CRUZ MINGUS MRN: [id number] Date of Birth: 1955-10-27   Important Message Given:  Yes - Medicare IM  Patient left prior to IM delivery will mail a copy to the patient home address.    Adoni Greenough 02/07/2023, 1:02 PM

## 2023-02-07 NOTE — TOC Transition Note (Signed)
Transition of Care [facility]) - CM/SW Discharge Note   Patient Details  Name: [name] [name] MRN: [id number] Date of Birth: [birth year]  Transition of [facility]) CM/SW Contact:  Gordy Clement, RN Phone Number: 02/07/2023, 9:15 AM   Clinical Narrative:     Patient will be Dc'd to home today with Home Hospice from [facility]. [facility] to transport. Wife is aware. [facility] notified- They have also delivered all needed DME to the home     Final next level of care: Home w Hospice Care Barriers to Discharge: Continued Medical Work up   Patient Goals and CMS Choice      Discharge Placement                         Discharge Plan and Services Additional resources added to the After Visit Summary for                                    Representative spoke with at [facility] Agency: [facility]  Social Determinants of Health (SDOH) Interventions SDOH Screenings   Food Insecurity: No Food Insecurity (01/29/2023)  Housing: Low Risk  (01/29/2023)  Transportation Needs: No Transportation Needs (01/29/2023)  Utilities: Not At Risk (01/29/2023)  Alcohol Screen: Low Risk  (11/20/2022)  Depression (PHQ2-9): Low Risk  (11/20/2022)  Financial Resource Strain: Low Risk  (12/01/2022)  Physical Activity: Sufficiently Active (12/01/2022)  Social Connections: Moderately Integrated (12/01/2022)  Stress: No Stress Concern Present (12/01/2022)  Tobacco Use: Low Risk  (01/29/2023)  Health Literacy: Adequate Health Literacy (11/20/2022)     Readmission Risk Interventions    12/26/2022   11:40 AM  Readmission Risk Prevention Plan  Transportation Screening Complete  HRI or Home Care Consult Complete  Social Work Consult for Recovery Care Planning/Counseling Complete  Palliative Care Screening Not Applicable  Medication Review [employer]) Complete

## 2023-02-07 NOTE — Plan of Care (Signed)
  Problem: Education: Goal: Knowledge of General Education information will improve Description: Including pain rating scale, medication(s)/side effects and non-pharmacologic comfort measures Outcome: Progressing   Problem: Clinical Measurements: Goal: Will remain free from infection Outcome: Progressing Goal: Diagnostic test results will improve Outcome: Progressing   Problem: Activity: Goal: Risk for activity intolerance will decrease Outcome: Progressing   Problem: Pain Managment: Goal: General experience of comfort will improve Outcome: Progressing   Problem: Safety: Goal: Ability to remain free from injury will improve Outcome: Progressing   Problem: Activity: Goal: Ability to tolerate increased activity will improve Outcome: Progressing   Problem: Respiratory: Goal: Ability to maintain a clear airway and adequate ventilation will improve Outcome: Progressing

## 2023-02-07 NOTE — Discharge Instructions (Signed)
Management per hospice at home.

## 2023-02-07 NOTE — Discharge Summary (Signed)
Manual pressure was held on the groin.  Hemostasis was achieved.  A bandage was applied.  Upon completion of the case instrument and sharps counts were  confirmed correct. The patient was transferred to the PACU in good condition. I was present for all portions of the procedure.  FOLLOW UP PLAN: Assuming a normal postoperative course, we will remove the IVC filter as soon as he can be anticoagulated safely.  [name]. Lenell Antu, MD [facility] Phone Number: [phone] 01/26/2023 2:26 PM     VAS [place] LOWER EXTREMITY VENOUS (DVT)  Result Date: 01/25/2023  Lower Venous DVT Study Patient Name:  [name] [name]  Date of Exam:   01/25/2023 Medical Rec #: [id number]      Accession #:    [id number] Date of Birth: 08-27-1955      Patient Gender: M Patient Age:   67 years Exam Location:  [facility] Procedure:      VAS [place] LOWER EXTREMITY VENOUS (DVT) Referring Phys: PAMELA LOVE --------------------------------------------------------------------------------  Indications: Immobility.  Risk Factors: None identified. Limitations: Poor ultrasound/tissue interface. Comparison Study: No prior studies. Performing Technologist: Chanda Busing RVT  Examination Guidelines: A complete evaluation includes B-mode imaging, spectral Doppler, color Doppler, and power Doppler as needed of all accessible portions of each vessel. Bilateral testing is considered an integral part of a complete examination. Limited examinations for reoccurring indications may be performed as noted. The reflux portion of the exam is performed with the patient in reverse Trendelenburg.  +---------+---------------+---------+-----------+----------+--------------+ RIGHT    CompressibilityPhasicitySpontaneityPropertiesThrombus Aging +---------+---------------+---------+-----------+----------+--------------+ CFV      Full           Yes      Yes                                 +---------+---------------+---------+-----------+----------+--------------+ SFJ      Full                                                         +---------+---------------+---------+-----------+----------+--------------+ FV Prox  Full                                                        +---------+---------------+---------+-----------+----------+--------------+ FV Mid   Full                                                        +---------+---------------+---------+-----------+----------+--------------+ FV DistalFull                                                        +---------+---------------+---------+-----------+----------+--------------+ PFV      Full           Yes      Yes                                 +---------+---------------+---------+-----------+----------+--------------+  procedural complication. IMPRESSION: Successful removal of tunneled dialysis catheter. Procedure performed by: Corrin Parker, PA-C Electronically Signed   By: Marliss Coots M.D.   On: 02/05/2023 14:25   DG Abd 1 View  Result Date: 02/01/2023 CLINICAL DATA:  Follow up small bowel dilatation EXAM: ABDOMEN - 1 VIEW COMPARISON:  02/01/2023 FINDINGS: Scattered large and small bowel gas is noted. No obstructive changes are seen. IVC filter is noted in place. No free air is seen. Degenerative changes of lumbar spine are noted. IMPRESSION: No obstructive changes seen. Electronically Signed   By: Alcide Clever M.D.   On: 02/01/2023 20:22   DG Abd Portable 1V  Result Date: 02/01/2023 CLINICAL DATA:  67 year old male small-bowel obstruction. EXAM: PORTABLE ABDOMEN - 1 VIEW COMPARISON:  01/31/2023 and earlier. FINDINGS: Portable AP supine view at 0632 hours. Negative lung bases. IVC filter redemonstrated. Nonobstructed bowel gas pattern, mid abdominal small bowel loops with flocculated material not significantly changed from the CT on 01/28/2023. Extensive Aortoiliac calcified atherosclerosis. No acute osseous abnormality identified. IMPRESSION: Stable bowel gas pattern without evidence of acute bowel obstruction. Aortic Atherosclerosis (ICD10-I70.0). Electronically Signed   By: Odessa Fleming M.D.   On: 02/01/2023 07:41   VAS [place] UPPER  EXTREMITY VENOUS DUPLEX  Result Date: 01/31/2023 UPPER VENOUS STUDY  Patient Name:  [name] [name]  Date of Exam:   01/31/2023 Medical Rec #: [id number]      Accession #:    [id number] Date of Birth: October 04, 1955      Patient Gender: M Patient Age:   67 years Exam Location:  [facility] Procedure:      VAS [place] UPPER EXTREMITY VENOUS DUPLEX Referring Phys: Bess Harvest [facility] --------------------------------------------------------------------------------  Indications: Swelling Risk Factors: DVT Surgery 01/26/2023 - IVC filter. Limitations: Poor ultrasound/tissue interface and line. Comparison Study: 01/25/2023 - Bilateral lower extremity venous duplex                   Acute deep venous thrombosis of right popliteal vein, right                   peroneal vein,                   left peroneal vein, left soleal vein, and left gastrocnemius                   vein. Please                   see table above for more detail. Performing Technologist: Chanda Busing RVT  Examination Guidelines: A complete evaluation includes B-mode imaging, spectral Doppler, color Doppler, and power Doppler as needed of all accessible portions of each vessel. Bilateral testing is considered an integral part of a complete examination. Limited examinations for reoccurring indications may be performed as noted.  Right Findings: +----------+------------+---------+-----------+----------+-------+ RIGHT     CompressiblePhasicitySpontaneousPropertiesSummary +----------+------------+---------+-----------+----------+-------+ IJV           Full       Yes       Yes                      +----------+------------+---------+-----------+----------+-------+ Subclavian    Full       Yes       Yes                      +----------+------------+---------+-----------+----------+-------+ Axillary      Full       Yes  findings. Telemetry leads overlie the chest. IMPRESSION: Interval extubation and removal  of the enteric tube and right IJ central venous catheter. No acute cardiopulmonary process. Electronically Signed   By: Carey Bullocks M.D.   On: 01/09/2023 13:23   DG Swallowing Func-Speech Pathology  Result Date: 01/08/2023 Table formatting from the original result was not included. Modified Barium Swallow Study Patient Details Name: [name] [name] MRN: [id number] Date of Birth: [birth year] Today's Date: 01/08/2023 HPI/PMH: HPI: Pt is a 67 yo male presenting to ED from OP vascular surgery office with known mesenteric artery stenosis s/p SMA angiogram. Admitted 9/11 with several week history of abdominal pain with 30 lb weight loss. W/u included mesenteric ischemia and necrotic area of jejunum s/p exploratory laparotomy and small bowel resection 9/12, reopening of laparotomy 9/14, second reopening and small bowel resection with anastomosis x2 9/26. Intubated 9/12-9/22. PMH includes testicular cancer, HTN, retinal detachment, GERD, HTN, HLD Clinical Impression: Clinical Impression: Pt presents with a suspected cognitively based oral dysphagia, although his swallow is overall functional. Suspect the taste of the barium contributed to oral holding and gagging, although oral holding of purees was also noted at bedside. Only one instance of trace, transient penetration occurred as pt was attempting to swallow the pill with thin liquids and seemingly gagged. He exhibited great airway protection throughout this event and no further penetration/aspiration was observed throughout trials of nectar or honey thick liquids, purees, and solids. Pt required frequent breaks and had increased WOB at times. Given pt's fatigue and cognition, recommend initiating a diet of full liquids with good potential to upgrade as mentation improves. He will require total assistance for feeding. Question pt's ability to attend throughout the course of a meal and achieve adequate oral intake at this time, therefore, he may require continued TPN  until adequate intake is achieved. Will continue to follow to target diet toleration and upgrade as able. Factors that may increase risk of adverse event in presence of aspiration Rubye Oaks & Clearance Coots 2021): Factors that may increase risk of adverse event in presence of aspiration Rubye Oaks & Clearance Coots 2021): Respiratory or GI disease; Reduced cognitive function; Frail or deconditioned; Dependence for feeding and/or oral hygiene Recommendations/Plan: Swallowing Evaluation Recommendations Swallowing Evaluation Recommendations Recommendations: PO diet PO Diet Recommendation: Full liquid diet Liquid Administration via: Spoon; Cup; Straw Medication Administration: Whole meds with puree Supervision: Full assist for feeding; Full supervision/cueing for swallowing strategies Swallowing strategies  : Minimize environmental distractions; Slow rate; Small bites/sips Postural changes: Position pt fully upright for meals Oral care recommendations: Oral care BID (2x/day); Staff/trained caregiver to provide oral care Treatment Plan Treatment Plan Treatment recommendations: Therapy as outlined in treatment plan below Follow-up recommendations: Acute inpatient rehab (3 hours/day) Functional status assessment: Patient has had a recent decline in their functional status and demonstrates the ability to make significant improvements in function in a reasonable and predictable amount of time. Treatment frequency: Min 2x/week Treatment duration: 2 weeks Interventions: Aspiration precaution training; Oropharyngeal exercises; Patient/family education; Trials of upgraded texture/liquids; Diet toleration management by SLP Recommendations Recommendations for follow up therapy are one component of a multi-disciplinary discharge planning process, led by the attending physician.  Recommendations may be updated based on patient status, additional functional criteria and insurance authorization. Assessment: Orofacial Exam: Orofacial Exam Oral Cavity:  Oral Hygiene: Dried secretions Oral Cavity - Dentition: Adequate natural dentition Orofacial Anatomy: WFL Oral Motor/Sensory Function: WFL Anatomy: Anatomy: Suspected cervical osteophytes Boluses Administered: Boluses Administered Boluses Administered: Thin liquids (Level 0); Mildly thick liquids (  Manual pressure was held on the groin.  Hemostasis was achieved.  A bandage was applied.  Upon completion of the case instrument and sharps counts were  confirmed correct. The patient was transferred to the PACU in good condition. I was present for all portions of the procedure.  FOLLOW UP PLAN: Assuming a normal postoperative course, we will remove the IVC filter as soon as he can be anticoagulated safely.  [name]. Lenell Antu, MD [facility] Phone Number: [phone] 01/26/2023 2:26 PM     VAS [place] LOWER EXTREMITY VENOUS (DVT)  Result Date: 01/25/2023  Lower Venous DVT Study Patient Name:  [name] [name]  Date of Exam:   01/25/2023 Medical Rec #: [id number]      Accession #:    [id number] Date of Birth: 08-27-1955      Patient Gender: M Patient Age:   67 years Exam Location:  [facility] Procedure:      VAS [place] LOWER EXTREMITY VENOUS (DVT) Referring Phys: PAMELA LOVE --------------------------------------------------------------------------------  Indications: Immobility.  Risk Factors: None identified. Limitations: Poor ultrasound/tissue interface. Comparison Study: No prior studies. Performing Technologist: Chanda Busing RVT  Examination Guidelines: A complete evaluation includes B-mode imaging, spectral Doppler, color Doppler, and power Doppler as needed of all accessible portions of each vessel. Bilateral testing is considered an integral part of a complete examination. Limited examinations for reoccurring indications may be performed as noted. The reflux portion of the exam is performed with the patient in reverse Trendelenburg.  +---------+---------------+---------+-----------+----------+--------------+ RIGHT    CompressibilityPhasicitySpontaneityPropertiesThrombus Aging +---------+---------------+---------+-----------+----------+--------------+ CFV      Full           Yes      Yes                                 +---------+---------------+---------+-----------+----------+--------------+ SFJ      Full                                                         +---------+---------------+---------+-----------+----------+--------------+ FV Prox  Full                                                        +---------+---------------+---------+-----------+----------+--------------+ FV Mid   Full                                                        +---------+---------------+---------+-----------+----------+--------------+ FV DistalFull                                                        +---------+---------------+---------+-----------+----------+--------------+ PFV      Full           Yes      Yes                                 +---------+---------------+---------+-----------+----------+--------------+  findings. Telemetry leads overlie the chest. IMPRESSION: Interval extubation and removal  of the enteric tube and right IJ central venous catheter. No acute cardiopulmonary process. Electronically Signed   By: Carey Bullocks M.D.   On: 01/09/2023 13:23   DG Swallowing Func-Speech Pathology  Result Date: 01/08/2023 Table formatting from the original result was not included. Modified Barium Swallow Study Patient Details Name: [name] [name] MRN: [id number] Date of Birth: [birth year] Today's Date: 01/08/2023 HPI/PMH: HPI: Pt is a 67 yo male presenting to ED from OP vascular surgery office with known mesenteric artery stenosis s/p SMA angiogram. Admitted 9/11 with several week history of abdominal pain with 30 lb weight loss. W/u included mesenteric ischemia and necrotic area of jejunum s/p exploratory laparotomy and small bowel resection 9/12, reopening of laparotomy 9/14, second reopening and small bowel resection with anastomosis x2 9/26. Intubated 9/12-9/22. PMH includes testicular cancer, HTN, retinal detachment, GERD, HTN, HLD Clinical Impression: Clinical Impression: Pt presents with a suspected cognitively based oral dysphagia, although his swallow is overall functional. Suspect the taste of the barium contributed to oral holding and gagging, although oral holding of purees was also noted at bedside. Only one instance of trace, transient penetration occurred as pt was attempting to swallow the pill with thin liquids and seemingly gagged. He exhibited great airway protection throughout this event and no further penetration/aspiration was observed throughout trials of nectar or honey thick liquids, purees, and solids. Pt required frequent breaks and had increased WOB at times. Given pt's fatigue and cognition, recommend initiating a diet of full liquids with good potential to upgrade as mentation improves. He will require total assistance for feeding. Question pt's ability to attend throughout the course of a meal and achieve adequate oral intake at this time, therefore, he may require continued TPN  until adequate intake is achieved. Will continue to follow to target diet toleration and upgrade as able. Factors that may increase risk of adverse event in presence of aspiration Rubye Oaks & Clearance Coots 2021): Factors that may increase risk of adverse event in presence of aspiration Rubye Oaks & Clearance Coots 2021): Respiratory or GI disease; Reduced cognitive function; Frail or deconditioned; Dependence for feeding and/or oral hygiene Recommendations/Plan: Swallowing Evaluation Recommendations Swallowing Evaluation Recommendations Recommendations: PO diet PO Diet Recommendation: Full liquid diet Liquid Administration via: Spoon; Cup; Straw Medication Administration: Whole meds with puree Supervision: Full assist for feeding; Full supervision/cueing for swallowing strategies Swallowing strategies  : Minimize environmental distractions; Slow rate; Small bites/sips Postural changes: Position pt fully upright for meals Oral care recommendations: Oral care BID (2x/day); Staff/trained caregiver to provide oral care Treatment Plan Treatment Plan Treatment recommendations: Therapy as outlined in treatment plan below Follow-up recommendations: Acute inpatient rehab (3 hours/day) Functional status assessment: Patient has had a recent decline in their functional status and demonstrates the ability to make significant improvements in function in a reasonable and predictable amount of time. Treatment frequency: Min 2x/week Treatment duration: 2 weeks Interventions: Aspiration precaution training; Oropharyngeal exercises; Patient/family education; Trials of upgraded texture/liquids; Diet toleration management by SLP Recommendations Recommendations for follow up therapy are one component of a multi-disciplinary discharge planning process, led by the attending physician.  Recommendations may be updated based on patient status, additional functional criteria and insurance authorization. Assessment: Orofacial Exam: Orofacial Exam Oral Cavity:  Oral Hygiene: Dried secretions Oral Cavity - Dentition: Adequate natural dentition Orofacial Anatomy: WFL Oral Motor/Sensory Function: WFL Anatomy: Anatomy: Suspected cervical osteophytes Boluses Administered: Boluses Administered Boluses Administered: Thin liquids (Level 0); Mildly thick liquids (  procedural complication. IMPRESSION: Successful removal of tunneled dialysis catheter. Procedure performed by: Corrin Parker, PA-C Electronically Signed   By: Marliss Coots M.D.   On: 02/05/2023 14:25   DG Abd 1 View  Result Date: 02/01/2023 CLINICAL DATA:  Follow up small bowel dilatation EXAM: ABDOMEN - 1 VIEW COMPARISON:  02/01/2023 FINDINGS: Scattered large and small bowel gas is noted. No obstructive changes are seen. IVC filter is noted in place. No free air is seen. Degenerative changes of lumbar spine are noted. IMPRESSION: No obstructive changes seen. Electronically Signed   By: Alcide Clever M.D.   On: 02/01/2023 20:22   DG Abd Portable 1V  Result Date: 02/01/2023 CLINICAL DATA:  67 year old male small-bowel obstruction. EXAM: PORTABLE ABDOMEN - 1 VIEW COMPARISON:  01/31/2023 and earlier. FINDINGS: Portable AP supine view at 0632 hours. Negative lung bases. IVC filter redemonstrated. Nonobstructed bowel gas pattern, mid abdominal small bowel loops with flocculated material not significantly changed from the CT on 01/28/2023. Extensive Aortoiliac calcified atherosclerosis. No acute osseous abnormality identified. IMPRESSION: Stable bowel gas pattern without evidence of acute bowel obstruction. Aortic Atherosclerosis (ICD10-I70.0). Electronically Signed   By: Odessa Fleming M.D.   On: 02/01/2023 07:41   VAS [place] UPPER  EXTREMITY VENOUS DUPLEX  Result Date: 01/31/2023 UPPER VENOUS STUDY  Patient Name:  [name] [name]  Date of Exam:   01/31/2023 Medical Rec #: [id number]      Accession #:    [id number] Date of Birth: October 04, 1955      Patient Gender: M Patient Age:   67 years Exam Location:  [facility] Procedure:      VAS [place] UPPER EXTREMITY VENOUS DUPLEX Referring Phys: Bess Harvest [facility] --------------------------------------------------------------------------------  Indications: Swelling Risk Factors: DVT Surgery 01/26/2023 - IVC filter. Limitations: Poor ultrasound/tissue interface and line. Comparison Study: 01/25/2023 - Bilateral lower extremity venous duplex                   Acute deep venous thrombosis of right popliteal vein, right                   peroneal vein,                   left peroneal vein, left soleal vein, and left gastrocnemius                   vein. Please                   see table above for more detail. Performing Technologist: Chanda Busing RVT  Examination Guidelines: A complete evaluation includes B-mode imaging, spectral Doppler, color Doppler, and power Doppler as needed of all accessible portions of each vessel. Bilateral testing is considered an integral part of a complete examination. Limited examinations for reoccurring indications may be performed as noted.  Right Findings: +----------+------------+---------+-----------+----------+-------+ RIGHT     CompressiblePhasicitySpontaneousPropertiesSummary +----------+------------+---------+-----------+----------+-------+ IJV           Full       Yes       Yes                      +----------+------------+---------+-----------+----------+-------+ Subclavian    Full       Yes       Yes                      +----------+------------+---------+-----------+----------+-------+ Axillary      Full       Yes  Manual pressure was held on the groin.  Hemostasis was achieved.  A bandage was applied.  Upon completion of the case instrument and sharps counts were  confirmed correct. The patient was transferred to the PACU in good condition. I was present for all portions of the procedure.  FOLLOW UP PLAN: Assuming a normal postoperative course, we will remove the IVC filter as soon as he can be anticoagulated safely.  [name]. Lenell Antu, MD [facility] Phone Number: [phone] 01/26/2023 2:26 PM     VAS [place] LOWER EXTREMITY VENOUS (DVT)  Result Date: 01/25/2023  Lower Venous DVT Study Patient Name:  [name] [name]  Date of Exam:   01/25/2023 Medical Rec #: [id number]      Accession #:    [id number] Date of Birth: 08-27-1955      Patient Gender: M Patient Age:   67 years Exam Location:  [facility] Procedure:      VAS [place] LOWER EXTREMITY VENOUS (DVT) Referring Phys: PAMELA LOVE --------------------------------------------------------------------------------  Indications: Immobility.  Risk Factors: None identified. Limitations: Poor ultrasound/tissue interface. Comparison Study: No prior studies. Performing Technologist: Chanda Busing RVT  Examination Guidelines: A complete evaluation includes B-mode imaging, spectral Doppler, color Doppler, and power Doppler as needed of all accessible portions of each vessel. Bilateral testing is considered an integral part of a complete examination. Limited examinations for reoccurring indications may be performed as noted. The reflux portion of the exam is performed with the patient in reverse Trendelenburg.  +---------+---------------+---------+-----------+----------+--------------+ RIGHT    CompressibilityPhasicitySpontaneityPropertiesThrombus Aging +---------+---------------+---------+-----------+----------+--------------+ CFV      Full           Yes      Yes                                 +---------+---------------+---------+-----------+----------+--------------+ SFJ      Full                                                         +---------+---------------+---------+-----------+----------+--------------+ FV Prox  Full                                                        +---------+---------------+---------+-----------+----------+--------------+ FV Mid   Full                                                        +---------+---------------+---------+-----------+----------+--------------+ FV DistalFull                                                        +---------+---------------+---------+-----------+----------+--------------+ PFV      Full           Yes      Yes                                 +---------+---------------+---------+-----------+----------+--------------+  findings. Telemetry leads overlie the chest. IMPRESSION: Interval extubation and removal  of the enteric tube and right IJ central venous catheter. No acute cardiopulmonary process. Electronically Signed   By: Carey Bullocks M.D.   On: 01/09/2023 13:23   DG Swallowing Func-Speech Pathology  Result Date: 01/08/2023 Table formatting from the original result was not included. Modified Barium Swallow Study Patient Details Name: [name] [name] MRN: [id number] Date of Birth: [birth year] Today's Date: 01/08/2023 HPI/PMH: HPI: Pt is a 67 yo male presenting to ED from OP vascular surgery office with known mesenteric artery stenosis s/p SMA angiogram. Admitted 9/11 with several week history of abdominal pain with 30 lb weight loss. W/u included mesenteric ischemia and necrotic area of jejunum s/p exploratory laparotomy and small bowel resection 9/12, reopening of laparotomy 9/14, second reopening and small bowel resection with anastomosis x2 9/26. Intubated 9/12-9/22. PMH includes testicular cancer, HTN, retinal detachment, GERD, HTN, HLD Clinical Impression: Clinical Impression: Pt presents with a suspected cognitively based oral dysphagia, although his swallow is overall functional. Suspect the taste of the barium contributed to oral holding and gagging, although oral holding of purees was also noted at bedside. Only one instance of trace, transient penetration occurred as pt was attempting to swallow the pill with thin liquids and seemingly gagged. He exhibited great airway protection throughout this event and no further penetration/aspiration was observed throughout trials of nectar or honey thick liquids, purees, and solids. Pt required frequent breaks and had increased WOB at times. Given pt's fatigue and cognition, recommend initiating a diet of full liquids with good potential to upgrade as mentation improves. He will require total assistance for feeding. Question pt's ability to attend throughout the course of a meal and achieve adequate oral intake at this time, therefore, he may require continued TPN  until adequate intake is achieved. Will continue to follow to target diet toleration and upgrade as able. Factors that may increase risk of adverse event in presence of aspiration Rubye Oaks & Clearance Coots 2021): Factors that may increase risk of adverse event in presence of aspiration Rubye Oaks & Clearance Coots 2021): Respiratory or GI disease; Reduced cognitive function; Frail or deconditioned; Dependence for feeding and/or oral hygiene Recommendations/Plan: Swallowing Evaluation Recommendations Swallowing Evaluation Recommendations Recommendations: PO diet PO Diet Recommendation: Full liquid diet Liquid Administration via: Spoon; Cup; Straw Medication Administration: Whole meds with puree Supervision: Full assist for feeding; Full supervision/cueing for swallowing strategies Swallowing strategies  : Minimize environmental distractions; Slow rate; Small bites/sips Postural changes: Position pt fully upright for meals Oral care recommendations: Oral care BID (2x/day); Staff/trained caregiver to provide oral care Treatment Plan Treatment Plan Treatment recommendations: Therapy as outlined in treatment plan below Follow-up recommendations: Acute inpatient rehab (3 hours/day) Functional status assessment: Patient has had a recent decline in their functional status and demonstrates the ability to make significant improvements in function in a reasonable and predictable amount of time. Treatment frequency: Min 2x/week Treatment duration: 2 weeks Interventions: Aspiration precaution training; Oropharyngeal exercises; Patient/family education; Trials of upgraded texture/liquids; Diet toleration management by SLP Recommendations Recommendations for follow up therapy are one component of a multi-disciplinary discharge planning process, led by the attending physician.  Recommendations may be updated based on patient status, additional functional criteria and insurance authorization. Assessment: Orofacial Exam: Orofacial Exam Oral Cavity:  Oral Hygiene: Dried secretions Oral Cavity - Dentition: Adequate natural dentition Orofacial Anatomy: WFL Oral Motor/Sensory Function: WFL Anatomy: Anatomy: Suspected cervical osteophytes Boluses Administered: Boluses Administered Boluses Administered: Thin liquids (Level 0); Mildly thick liquids (  Manual pressure was held on the groin.  Hemostasis was achieved.  A bandage was applied.  Upon completion of the case instrument and sharps counts were  confirmed correct. The patient was transferred to the PACU in good condition. I was present for all portions of the procedure.  FOLLOW UP PLAN: Assuming a normal postoperative course, we will remove the IVC filter as soon as he can be anticoagulated safely.  [name]. Lenell Antu, MD [facility] Phone Number: [phone] 01/26/2023 2:26 PM     VAS [place] LOWER EXTREMITY VENOUS (DVT)  Result Date: 01/25/2023  Lower Venous DVT Study Patient Name:  [name] [name]  Date of Exam:   01/25/2023 Medical Rec #: [id number]      Accession #:    [id number] Date of Birth: 08-27-1955      Patient Gender: M Patient Age:   67 years Exam Location:  [facility] Procedure:      VAS [place] LOWER EXTREMITY VENOUS (DVT) Referring Phys: PAMELA LOVE --------------------------------------------------------------------------------  Indications: Immobility.  Risk Factors: None identified. Limitations: Poor ultrasound/tissue interface. Comparison Study: No prior studies. Performing Technologist: Chanda Busing RVT  Examination Guidelines: A complete evaluation includes B-mode imaging, spectral Doppler, color Doppler, and power Doppler as needed of all accessible portions of each vessel. Bilateral testing is considered an integral part of a complete examination. Limited examinations for reoccurring indications may be performed as noted. The reflux portion of the exam is performed with the patient in reverse Trendelenburg.  +---------+---------------+---------+-----------+----------+--------------+ RIGHT    CompressibilityPhasicitySpontaneityPropertiesThrombus Aging +---------+---------------+---------+-----------+----------+--------------+ CFV      Full           Yes      Yes                                 +---------+---------------+---------+-----------+----------+--------------+ SFJ      Full                                                         +---------+---------------+---------+-----------+----------+--------------+ FV Prox  Full                                                        +---------+---------------+---------+-----------+----------+--------------+ FV Mid   Full                                                        +---------+---------------+---------+-----------+----------+--------------+ FV DistalFull                                                        +---------+---------------+---------+-----------+----------+--------------+ PFV      Full           Yes      Yes                                 +---------+---------------+---------+-----------+----------+--------------+  findings. Telemetry leads overlie the chest. IMPRESSION: Interval extubation and removal  of the enteric tube and right IJ central venous catheter. No acute cardiopulmonary process. Electronically Signed   By: Carey Bullocks M.D.   On: 01/09/2023 13:23   DG Swallowing Func-Speech Pathology  Result Date: 01/08/2023 Table formatting from the original result was not included. Modified Barium Swallow Study Patient Details Name: [name] [name] MRN: [id number] Date of Birth: [birth year] Today's Date: 01/08/2023 HPI/PMH: HPI: Pt is a 67 yo male presenting to ED from OP vascular surgery office with known mesenteric artery stenosis s/p SMA angiogram. Admitted 9/11 with several week history of abdominal pain with 30 lb weight loss. W/u included mesenteric ischemia and necrotic area of jejunum s/p exploratory laparotomy and small bowel resection 9/12, reopening of laparotomy 9/14, second reopening and small bowel resection with anastomosis x2 9/26. Intubated 9/12-9/22. PMH includes testicular cancer, HTN, retinal detachment, GERD, HTN, HLD Clinical Impression: Clinical Impression: Pt presents with a suspected cognitively based oral dysphagia, although his swallow is overall functional. Suspect the taste of the barium contributed to oral holding and gagging, although oral holding of purees was also noted at bedside. Only one instance of trace, transient penetration occurred as pt was attempting to swallow the pill with thin liquids and seemingly gagged. He exhibited great airway protection throughout this event and no further penetration/aspiration was observed throughout trials of nectar or honey thick liquids, purees, and solids. Pt required frequent breaks and had increased WOB at times. Given pt's fatigue and cognition, recommend initiating a diet of full liquids with good potential to upgrade as mentation improves. He will require total assistance for feeding. Question pt's ability to attend throughout the course of a meal and achieve adequate oral intake at this time, therefore, he may require continued TPN  until adequate intake is achieved. Will continue to follow to target diet toleration and upgrade as able. Factors that may increase risk of adverse event in presence of aspiration Rubye Oaks & Clearance Coots 2021): Factors that may increase risk of adverse event in presence of aspiration Rubye Oaks & Clearance Coots 2021): Respiratory or GI disease; Reduced cognitive function; Frail or deconditioned; Dependence for feeding and/or oral hygiene Recommendations/Plan: Swallowing Evaluation Recommendations Swallowing Evaluation Recommendations Recommendations: PO diet PO Diet Recommendation: Full liquid diet Liquid Administration via: Spoon; Cup; Straw Medication Administration: Whole meds with puree Supervision: Full assist for feeding; Full supervision/cueing for swallowing strategies Swallowing strategies  : Minimize environmental distractions; Slow rate; Small bites/sips Postural changes: Position pt fully upright for meals Oral care recommendations: Oral care BID (2x/day); Staff/trained caregiver to provide oral care Treatment Plan Treatment Plan Treatment recommendations: Therapy as outlined in treatment plan below Follow-up recommendations: Acute inpatient rehab (3 hours/day) Functional status assessment: Patient has had a recent decline in their functional status and demonstrates the ability to make significant improvements in function in a reasonable and predictable amount of time. Treatment frequency: Min 2x/week Treatment duration: 2 weeks Interventions: Aspiration precaution training; Oropharyngeal exercises; Patient/family education; Trials of upgraded texture/liquids; Diet toleration management by SLP Recommendations Recommendations for follow up therapy are one component of a multi-disciplinary discharge planning process, led by the attending physician.  Recommendations may be updated based on patient status, additional functional criteria and insurance authorization. Assessment: Orofacial Exam: Orofacial Exam Oral Cavity:  Oral Hygiene: Dried secretions Oral Cavity - Dentition: Adequate natural dentition Orofacial Anatomy: WFL Oral Motor/Sensory Function: WFL Anatomy: Anatomy: Suspected cervical osteophytes Boluses Administered: Boluses Administered Boluses Administered: Thin liquids (Level 0); Mildly thick liquids (  procedural complication. IMPRESSION: Successful removal of tunneled dialysis catheter. Procedure performed by: Corrin Parker, PA-C Electronically Signed   By: Marliss Coots M.D.   On: 02/05/2023 14:25   DG Abd 1 View  Result Date: 02/01/2023 CLINICAL DATA:  Follow up small bowel dilatation EXAM: ABDOMEN - 1 VIEW COMPARISON:  02/01/2023 FINDINGS: Scattered large and small bowel gas is noted. No obstructive changes are seen. IVC filter is noted in place. No free air is seen. Degenerative changes of lumbar spine are noted. IMPRESSION: No obstructive changes seen. Electronically Signed   By: Alcide Clever M.D.   On: 02/01/2023 20:22   DG Abd Portable 1V  Result Date: 02/01/2023 CLINICAL DATA:  67 year old male small-bowel obstruction. EXAM: PORTABLE ABDOMEN - 1 VIEW COMPARISON:  01/31/2023 and earlier. FINDINGS: Portable AP supine view at 0632 hours. Negative lung bases. IVC filter redemonstrated. Nonobstructed bowel gas pattern, mid abdominal small bowel loops with flocculated material not significantly changed from the CT on 01/28/2023. Extensive Aortoiliac calcified atherosclerosis. No acute osseous abnormality identified. IMPRESSION: Stable bowel gas pattern without evidence of acute bowel obstruction. Aortic Atherosclerosis (ICD10-I70.0). Electronically Signed   By: Odessa Fleming M.D.   On: 02/01/2023 07:41   VAS [place] UPPER  EXTREMITY VENOUS DUPLEX  Result Date: 01/31/2023 UPPER VENOUS STUDY  Patient Name:  [name] [name]  Date of Exam:   01/31/2023 Medical Rec #: [id number]      Accession #:    [id number] Date of Birth: October 04, 1955      Patient Gender: M Patient Age:   67 years Exam Location:  [facility] Procedure:      VAS [place] UPPER EXTREMITY VENOUS DUPLEX Referring Phys: Bess Harvest [facility] --------------------------------------------------------------------------------  Indications: Swelling Risk Factors: DVT Surgery 01/26/2023 - IVC filter. Limitations: Poor ultrasound/tissue interface and line. Comparison Study: 01/25/2023 - Bilateral lower extremity venous duplex                   Acute deep venous thrombosis of right popliteal vein, right                   peroneal vein,                   left peroneal vein, left soleal vein, and left gastrocnemius                   vein. Please                   see table above for more detail. Performing Technologist: Chanda Busing RVT  Examination Guidelines: A complete evaluation includes B-mode imaging, spectral Doppler, color Doppler, and power Doppler as needed of all accessible portions of each vessel. Bilateral testing is considered an integral part of a complete examination. Limited examinations for reoccurring indications may be performed as noted.  Right Findings: +----------+------------+---------+-----------+----------+-------+ RIGHT     CompressiblePhasicitySpontaneousPropertiesSummary +----------+------------+---------+-----------+----------+-------+ IJV           Full       Yes       Yes                      +----------+------------+---------+-----------+----------+-------+ Subclavian    Full       Yes       Yes                      +----------+------------+---------+-----------+----------+-------+ Axillary      Full       Yes  Physician Discharge Summary  [name] [name]:[id number] DOB: 05-Oct-1955 DOA: 01/29/2023  PCP: [name] Salisbury, MD  Admit date: 01/29/2023 Discharge date: 02/07/2023  Admitted From: (HomeF) Disposition:  (Home with  Hospice)  Recommendations for Outpatient Follow-up:  Management per hospice  Diet recommendation: clear liquid diet  Brief/Interim Summary:  67 y.o. male with medical history significant of HTN, HLD, who was in inpatient rehab who comes into the hospital with abdominal pain.  He recently had a prolonged and complicated hospitalization between 12/20/2022-01/24/2023.  For full hospitalization course please refer to the DC summary on 01/24/2023 by Dr. Lowell Guitar.  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  Vascular surgery was consulted and he was found to have an acute occlusion of the distal SMA as well as the inferior mesenteric artery.  General surgery was also consulted, and he was taken to the OR emergently, underwent an SMA stent and had resected the portion of his jejunum due to necrosis.  Hospital course complicated by multiple OR trips to check on bowel integrity with additional resections and reanastomoses x 2.  He also developed renal failure and had to be placed on dialysis.   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  Upon stability he was discharged to inpatient rehab, he was doing well up until  he felt sharp abdominal pain, along with nausea and vomiting.  He was found to have concerns for small bowel obstruction, was seen by general surgery this morning and felt like he would benefit from being moved back to  the acute care side.  Of note, while in rehab, he developed a DVT and underwent IVC filter placement.  CT scan significant for small bowel obstruction as well, able to tolerate diet secondary to bowel ischemia, he is with significant abdominal pain, nausea, vomiting with minimal p.o. intake, barely able to tolerate liquid diet, mentation has been made for lifelong TNA, multiple discussion has been carried with assistance of palliative medicine, previous MD, patient does not want TNA, and decision has been to proceed with discharge home with hospice.  Small bowel obstruction Recent SMA occlusion, mesenteric ischemia, small bowel infarction -Was followed by vascular surgery, general surgery,  With any dietary challenge patient is developing excruciating abdominal pain suggesting he has ongoing ischemia possible mesenteric stent occlusion.  Due to previous history of GI bleeds he is not on anticoagulation or antiplatelets, with caution aspirin was reintroduced by vascular surgery on 02/01/2023, he will be discharged on aspirin. -Previous MD had a long discussion with patient on 02/03/2023, he told me he had a lot of time to think about his prognosis and wants to focus on comfort and quality of life and wants to talk to palliative care, he is not interested in surviving off of TNA and would like to be made comfortable if he declines any further.  He was tolerating full liquid diet and was to be discharged to home with hospice on 02/06/2023.  She developed significant nausea, vomiting and abdominal pain as he had some soft diet, so he is transition back to clear liquid diet yesterday, and this morning he denies any abdominal pain, reports he is feeling well enough to go  procedural complication. IMPRESSION: Successful removal of tunneled dialysis catheter. Procedure performed by: Corrin Parker, PA-C Electronically Signed   By: Marliss Coots M.D.   On: 02/05/2023 14:25   DG Abd 1 View  Result Date: 02/01/2023 CLINICAL DATA:  Follow up small bowel dilatation EXAM: ABDOMEN - 1 VIEW COMPARISON:  02/01/2023 FINDINGS: Scattered large and small bowel gas is noted. No obstructive changes are seen. IVC filter is noted in place. No free air is seen. Degenerative changes of lumbar spine are noted. IMPRESSION: No obstructive changes seen. Electronically Signed   By: Alcide Clever M.D.   On: 02/01/2023 20:22   DG Abd Portable 1V  Result Date: 02/01/2023 CLINICAL DATA:  67 year old male small-bowel obstruction. EXAM: PORTABLE ABDOMEN - 1 VIEW COMPARISON:  01/31/2023 and earlier. FINDINGS: Portable AP supine view at 0632 hours. Negative lung bases. IVC filter redemonstrated. Nonobstructed bowel gas pattern, mid abdominal small bowel loops with flocculated material not significantly changed from the CT on 01/28/2023. Extensive Aortoiliac calcified atherosclerosis. No acute osseous abnormality identified. IMPRESSION: Stable bowel gas pattern without evidence of acute bowel obstruction. Aortic Atherosclerosis (ICD10-I70.0). Electronically Signed   By: Odessa Fleming M.D.   On: 02/01/2023 07:41   VAS [place] UPPER  EXTREMITY VENOUS DUPLEX  Result Date: 01/31/2023 UPPER VENOUS STUDY  Patient Name:  [name] [name]  Date of Exam:   01/31/2023 Medical Rec #: [id number]      Accession #:    [id number] Date of Birth: October 04, 1955      Patient Gender: M Patient Age:   67 years Exam Location:  [facility] Procedure:      VAS [place] UPPER EXTREMITY VENOUS DUPLEX Referring Phys: Bess Harvest [facility] --------------------------------------------------------------------------------  Indications: Swelling Risk Factors: DVT Surgery 01/26/2023 - IVC filter. Limitations: Poor ultrasound/tissue interface and line. Comparison Study: 01/25/2023 - Bilateral lower extremity venous duplex                   Acute deep venous thrombosis of right popliteal vein, right                   peroneal vein,                   left peroneal vein, left soleal vein, and left gastrocnemius                   vein. Please                   see table above for more detail. Performing Technologist: Chanda Busing RVT  Examination Guidelines: A complete evaluation includes B-mode imaging, spectral Doppler, color Doppler, and power Doppler as needed of all accessible portions of each vessel. Bilateral testing is considered an integral part of a complete examination. Limited examinations for reoccurring indications may be performed as noted.  Right Findings: +----------+------------+---------+-----------+----------+-------+ RIGHT     CompressiblePhasicitySpontaneousPropertiesSummary +----------+------------+---------+-----------+----------+-------+ IJV           Full       Yes       Yes                      +----------+------------+---------+-----------+----------+-------+ Subclavian    Full       Yes       Yes                      +----------+------------+---------+-----------+----------+-------+ Axillary      Full       Yes  findings. Telemetry leads overlie the chest. IMPRESSION: Interval extubation and removal  of the enteric tube and right IJ central venous catheter. No acute cardiopulmonary process. Electronically Signed   By: Carey Bullocks M.D.   On: 01/09/2023 13:23   DG Swallowing Func-Speech Pathology  Result Date: 01/08/2023 Table formatting from the original result was not included. Modified Barium Swallow Study Patient Details Name: [name] [name] MRN: [id number] Date of Birth: [birth year] Today's Date: 01/08/2023 HPI/PMH: HPI: Pt is a 67 yo male presenting to ED from OP vascular surgery office with known mesenteric artery stenosis s/p SMA angiogram. Admitted 9/11 with several week history of abdominal pain with 30 lb weight loss. W/u included mesenteric ischemia and necrotic area of jejunum s/p exploratory laparotomy and small bowel resection 9/12, reopening of laparotomy 9/14, second reopening and small bowel resection with anastomosis x2 9/26. Intubated 9/12-9/22. PMH includes testicular cancer, HTN, retinal detachment, GERD, HTN, HLD Clinical Impression: Clinical Impression: Pt presents with a suspected cognitively based oral dysphagia, although his swallow is overall functional. Suspect the taste of the barium contributed to oral holding and gagging, although oral holding of purees was also noted at bedside. Only one instance of trace, transient penetration occurred as pt was attempting to swallow the pill with thin liquids and seemingly gagged. He exhibited great airway protection throughout this event and no further penetration/aspiration was observed throughout trials of nectar or honey thick liquids, purees, and solids. Pt required frequent breaks and had increased WOB at times. Given pt's fatigue and cognition, recommend initiating a diet of full liquids with good potential to upgrade as mentation improves. He will require total assistance for feeding. Question pt's ability to attend throughout the course of a meal and achieve adequate oral intake at this time, therefore, he may require continued TPN  until adequate intake is achieved. Will continue to follow to target diet toleration and upgrade as able. Factors that may increase risk of adverse event in presence of aspiration Rubye Oaks & Clearance Coots 2021): Factors that may increase risk of adverse event in presence of aspiration Rubye Oaks & Clearance Coots 2021): Respiratory or GI disease; Reduced cognitive function; Frail or deconditioned; Dependence for feeding and/or oral hygiene Recommendations/Plan: Swallowing Evaluation Recommendations Swallowing Evaluation Recommendations Recommendations: PO diet PO Diet Recommendation: Full liquid diet Liquid Administration via: Spoon; Cup; Straw Medication Administration: Whole meds with puree Supervision: Full assist for feeding; Full supervision/cueing for swallowing strategies Swallowing strategies  : Minimize environmental distractions; Slow rate; Small bites/sips Postural changes: Position pt fully upright for meals Oral care recommendations: Oral care BID (2x/day); Staff/trained caregiver to provide oral care Treatment Plan Treatment Plan Treatment recommendations: Therapy as outlined in treatment plan below Follow-up recommendations: Acute inpatient rehab (3 hours/day) Functional status assessment: Patient has had a recent decline in their functional status and demonstrates the ability to make significant improvements in function in a reasonable and predictable amount of time. Treatment frequency: Min 2x/week Treatment duration: 2 weeks Interventions: Aspiration precaution training; Oropharyngeal exercises; Patient/family education; Trials of upgraded texture/liquids; Diet toleration management by SLP Recommendations Recommendations for follow up therapy are one component of a multi-disciplinary discharge planning process, led by the attending physician.  Recommendations may be updated based on patient status, additional functional criteria and insurance authorization. Assessment: Orofacial Exam: Orofacial Exam Oral Cavity:  Oral Hygiene: Dried secretions Oral Cavity - Dentition: Adequate natural dentition Orofacial Anatomy: WFL Oral Motor/Sensory Function: WFL Anatomy: Anatomy: Suspected cervical osteophytes Boluses Administered: Boluses Administered Boluses Administered: Thin liquids (Level 0); Mildly thick liquids (  Manual pressure was held on the groin.  Hemostasis was achieved.  A bandage was applied.  Upon completion of the case instrument and sharps counts were  confirmed correct. The patient was transferred to the PACU in good condition. I was present for all portions of the procedure.  FOLLOW UP PLAN: Assuming a normal postoperative course, we will remove the IVC filter as soon as he can be anticoagulated safely.  [name]. Lenell Antu, MD [facility] Phone Number: [phone] 01/26/2023 2:26 PM     VAS [place] LOWER EXTREMITY VENOUS (DVT)  Result Date: 01/25/2023  Lower Venous DVT Study Patient Name:  [name] [name]  Date of Exam:   01/25/2023 Medical Rec #: [id number]      Accession #:    [id number] Date of Birth: 08-27-1955      Patient Gender: M Patient Age:   67 years Exam Location:  [facility] Procedure:      VAS [place] LOWER EXTREMITY VENOUS (DVT) Referring Phys: PAMELA LOVE --------------------------------------------------------------------------------  Indications: Immobility.  Risk Factors: None identified. Limitations: Poor ultrasound/tissue interface. Comparison Study: No prior studies. Performing Technologist: Chanda Busing RVT  Examination Guidelines: A complete evaluation includes B-mode imaging, spectral Doppler, color Doppler, and power Doppler as needed of all accessible portions of each vessel. Bilateral testing is considered an integral part of a complete examination. Limited examinations for reoccurring indications may be performed as noted. The reflux portion of the exam is performed with the patient in reverse Trendelenburg.  +---------+---------------+---------+-----------+----------+--------------+ RIGHT    CompressibilityPhasicitySpontaneityPropertiesThrombus Aging +---------+---------------+---------+-----------+----------+--------------+ CFV      Full           Yes      Yes                                 +---------+---------------+---------+-----------+----------+--------------+ SFJ      Full                                                         +---------+---------------+---------+-----------+----------+--------------+ FV Prox  Full                                                        +---------+---------------+---------+-----------+----------+--------------+ FV Mid   Full                                                        +---------+---------------+---------+-----------+----------+--------------+ FV DistalFull                                                        +---------+---------------+---------+-----------+----------+--------------+ PFV      Full           Yes      Yes                                 +---------+---------------+---------+-----------+----------+--------------+  Physician Discharge Summary  [name] [name]:[id number] DOB: 05-Oct-1955 DOA: 01/29/2023  PCP: [name] Salisbury, MD  Admit date: 01/29/2023 Discharge date: 02/07/2023  Admitted From: (HomeF) Disposition:  (Home with  Hospice)  Recommendations for Outpatient Follow-up:  Management per hospice  Diet recommendation: clear liquid diet  Brief/Interim Summary:  67 y.o. male with medical history significant of HTN, HLD, who was in inpatient rehab who comes into the hospital with abdominal pain.  He recently had a prolonged and complicated hospitalization between 12/20/2022-01/24/2023.  For full hospitalization course please refer to the DC summary on 01/24/2023 by Dr. Lowell Guitar.  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  Vascular surgery was consulted and he was found to have an acute occlusion of the distal SMA as well as the inferior mesenteric artery.  General surgery was also consulted, and he was taken to the OR emergently, underwent an SMA stent and had resected the portion of his jejunum due to necrosis.  Hospital course complicated by multiple OR trips to check on bowel integrity with additional resections and reanastomoses x 2.  He also developed renal failure and had to be placed on dialysis.   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  Upon stability he was discharged to inpatient rehab, he was doing well up until  he felt sharp abdominal pain, along with nausea and vomiting.  He was found to have concerns for small bowel obstruction, was seen by general surgery this morning and felt like he would benefit from being moved back to  the acute care side.  Of note, while in rehab, he developed a DVT and underwent IVC filter placement.  CT scan significant for small bowel obstruction as well, able to tolerate diet secondary to bowel ischemia, he is with significant abdominal pain, nausea, vomiting with minimal p.o. intake, barely able to tolerate liquid diet, mentation has been made for lifelong TNA, multiple discussion has been carried with assistance of palliative medicine, previous MD, patient does not want TNA, and decision has been to proceed with discharge home with hospice.  Small bowel obstruction Recent SMA occlusion, mesenteric ischemia, small bowel infarction -Was followed by vascular surgery, general surgery,  With any dietary challenge patient is developing excruciating abdominal pain suggesting he has ongoing ischemia possible mesenteric stent occlusion.  Due to previous history of GI bleeds he is not on anticoagulation or antiplatelets, with caution aspirin was reintroduced by vascular surgery on 02/01/2023, he will be discharged on aspirin. -Previous MD had a long discussion with patient on 02/03/2023, he told me he had a lot of time to think about his prognosis and wants to focus on comfort and quality of life and wants to talk to palliative care, he is not interested in surviving off of TNA and would like to be made comfortable if he declines any further.  He was tolerating full liquid diet and was to be discharged to home with hospice on 02/06/2023.  She developed significant nausea, vomiting and abdominal pain as he had some soft diet, so he is transition back to clear liquid diet yesterday, and this morning he denies any abdominal pain, reports he is feeling well enough to go

## 2023-02-08 ENCOUNTER — Telehealth: Payer: Self-pay

## 2023-02-08 NOTE — Transitions of Care (Post Inpatient/ED Visit) (Signed)
02/08/2023  Name: [name] [name] MRN: [id number] DOB: 11/04/1955  Today's TOC FU Call Status: Today's TOC FU Call Status:: Successful TOC FU Call Completed TOC FU Call Complete Date: 02/08/23 Patient's Name and Date of Birth confirmed.  Transition Care Management Follow-up Telephone Call Date of Discharge: 02/07/23 Discharge Facility: [name]) Type of Discharge: Inpatient Admission Primary Inpatient Discharge Diagnosis:: "SBO" How have you been since you were released from the hospital?: Same (Wife voices pt rested well last night. She states Hospice nurse is in the home and working with pt at present.) Any questions or concerns?: Yes Patient Questions/Concerns:: Wife wanting to know phone number to floor pt was on to inquire about what type of "bag" they were using on pt intiially as it seemed to work wekll and stay on longer. Sattes hospice nurse having toruble witht he current bag and wantd bag name/brand info. Patient Questions/Concerns Addressed: Other: (Provided wife with numberto the floor for her per her request)  Items Reviewed: Did you receive and understand the discharge instructions provided?: Yes Medications obtained,verified, and reconciled?: No Medications Not Reviewed Reasons:: Other: (hospice nurse in the home and reviewed meds wth wife- wife going to pick up new meds today) Any new allergies since your discharge?: No Dietary orders reviewed?: Yes Type of Diet Ordered:: regular-clear liquid Do you have support at home?: Yes People in Home: spouse Name of Support/Comfort Primary Source: Synetta Fail  Medications Reviewed Today: Medications Reviewed Today   Medications were not reviewed in this encounter     Home Care and Equipment/Supplies: Were Home Health Services Ordered?: Yes Name of Home Health Agency:: [facility] Has Agency set up a time to come to your home?: Yes First Home Health Visit Date: 02/08/23 Any new equipment or medical supplies  ordered?: NA  Functional Questionnaire: Do you need assistance with bathing/showering or dressing?: Yes (wife assists with ADLs/IADLs) Do you need assistance with meal preparation?: Yes Do you need assistance with eating?: No Do you have difficulty maintaining continence: Yes Do you need assistance with getting out of bed/getting out of a chair/moving?: Yes Do you have difficulty managing or taking your medications?: Yes  Follow up appointments reviewed: PCP Follow-up appointment confirmed?: NA (under hospice care) Specialist Hospital Follow-up appointment confirmed?: NA Do you need transportation to your follow-up appointment?: No Do you understand care options if your condition(s) worsen?: Yes-patient verbalized understanding    Antionette Fairy, RN,BSN,CCM RN Care Manager Transitions of Care  [place]  Direct Phone: [phone] Toll Free: [phone] Fax: [phone]

## 2023-02-14 ENCOUNTER — Other Ambulatory Visit: Payer: Self-pay | Admitting: Family Medicine

## 2023-02-16 ENCOUNTER — Other Ambulatory Visit: Payer: Self-pay

## 2023-02-16 DIAGNOSIS — K551 Chronic vascular disorders of intestine: Secondary | ICD-10-CM

## 2023-03-01 ENCOUNTER — Encounter: Payer: Medicare Other | Admitting: Vascular Surgery

## 2023-03-01 ENCOUNTER — Ambulatory Visit (HOSPITAL_COMMUNITY): Payer: Medicare Other

## 2023-05-12 DEATH — deceased

## 2023-11-23 ENCOUNTER — Telehealth: Payer: Self-pay

## 2023-11-23 NOTE — Telephone Encounter (Signed)
 Unsuccessful attempts to reach patient on preferred number listed in notes for scheduled AWV. Left message on voicemail okay to reschedule.
